# Patient Record
Sex: Male | Born: 1960 | Race: Black or African American | Hispanic: No | Marital: Single | Smoking: Current every day smoker
Health system: Southern US, Community
[De-identification: ages and names within clinical notes are randomized; demographics above are authoritative.]

## PROBLEM LIST (undated history)

## (undated) DIAGNOSIS — K219 Gastro-esophageal reflux disease without esophagitis: Secondary | ICD-10-CM

## (undated) DIAGNOSIS — E119 Type 2 diabetes mellitus without complications: Secondary | ICD-10-CM

## (undated) DIAGNOSIS — F319 Bipolar disorder, unspecified: Secondary | ICD-10-CM

## (undated) DIAGNOSIS — J449 Chronic obstructive pulmonary disease, unspecified: Secondary | ICD-10-CM

## (undated) DIAGNOSIS — I10 Essential (primary) hypertension: Secondary | ICD-10-CM

## (undated) DIAGNOSIS — E785 Hyperlipidemia, unspecified: Secondary | ICD-10-CM

---

## 2018-01-06 ENCOUNTER — Emergency Department: Payer: Medicaid Other

## 2018-01-06 ENCOUNTER — Inpatient Hospital Stay
Admission: EM | Admit: 2018-01-06 | Discharge: 2018-03-19 | DRG: 004 | Disposition: A | Discharge status: Skilled Nursing Facility | Payer: Medicaid Other | Attending: Internal Medicine | Admitting: Internal Medicine

## 2018-01-06 ENCOUNTER — Encounter: Payer: Self-pay | Admitting: Internal Medicine

## 2018-01-06 DIAGNOSIS — J690 Pneumonitis due to inhalation of food and vomit: Secondary | ICD-10-CM | POA: Diagnosis present

## 2018-01-06 DIAGNOSIS — F17210 Nicotine dependence, cigarettes, uncomplicated: Secondary | ICD-10-CM | POA: Diagnosis present

## 2018-01-06 DIAGNOSIS — K922 Gastrointestinal hemorrhage, unspecified: Secondary | ICD-10-CM | POA: Diagnosis present

## 2018-01-06 DIAGNOSIS — E870 Hyperosmolality and hypernatremia: Secondary | ICD-10-CM | POA: Diagnosis present

## 2018-01-06 DIAGNOSIS — L89153 Pressure ulcer of sacral region, stage 3: Secondary | ICD-10-CM | POA: Diagnosis not present

## 2018-01-06 DIAGNOSIS — Z7984 Long term (current) use of oral hypoglycemic drugs: Secondary | ICD-10-CM

## 2018-01-06 DIAGNOSIS — Z452 Encounter for adjustment and management of vascular access device: Secondary | ICD-10-CM

## 2018-01-06 DIAGNOSIS — J189 Pneumonia, unspecified organism: Secondary | ICD-10-CM

## 2018-01-06 DIAGNOSIS — Y95 Nosocomial condition: Secondary | ICD-10-CM | POA: Diagnosis not present

## 2018-01-06 DIAGNOSIS — F203 Undifferentiated schizophrenia: Secondary | ICD-10-CM | POA: Diagnosis not present

## 2018-01-06 DIAGNOSIS — I420 Dilated cardiomyopathy: Secondary | ICD-10-CM | POA: Diagnosis not present

## 2018-01-06 DIAGNOSIS — F332 Major depressive disorder, recurrent severe without psychotic features: Secondary | ICD-10-CM

## 2018-01-06 DIAGNOSIS — J80 Acute respiratory distress syndrome: Secondary | ICD-10-CM | POA: Diagnosis present

## 2018-01-06 DIAGNOSIS — N179 Acute kidney failure, unspecified: Secondary | ICD-10-CM | POA: Diagnosis present

## 2018-01-06 DIAGNOSIS — K5900 Constipation, unspecified: Secondary | ICD-10-CM

## 2018-01-06 DIAGNOSIS — D696 Thrombocytopenia, unspecified: Secondary | ICD-10-CM | POA: Diagnosis not present

## 2018-01-06 DIAGNOSIS — K567 Ileus, unspecified: Secondary | ICD-10-CM | POA: Diagnosis present

## 2018-01-06 DIAGNOSIS — A4159 Other Gram-negative sepsis: Principal | ICD-10-CM | POA: Diagnosis present

## 2018-01-06 DIAGNOSIS — B961 Klebsiella pneumoniae [K. pneumoniae] as the cause of diseases classified elsewhere: Secondary | ICD-10-CM | POA: Diagnosis not present

## 2018-01-06 DIAGNOSIS — T17990A Other foreign object in respiratory tract, part unspecified in causing asphyxiation, initial encounter: Secondary | ICD-10-CM | POA: Diagnosis not present

## 2018-01-06 DIAGNOSIS — I472 Ventricular tachycardia: Secondary | ICD-10-CM | POA: Diagnosis not present

## 2018-01-06 DIAGNOSIS — S83279S Complex tear of lateral meniscus, current injury, unspecified knee, sequela: Secondary | ICD-10-CM

## 2018-01-06 DIAGNOSIS — I471 Supraventricular tachycardia: Secondary | ICD-10-CM | POA: Diagnosis not present

## 2018-01-06 DIAGNOSIS — Z87820 Personal history of traumatic brain injury: Secondary | ICD-10-CM

## 2018-01-06 DIAGNOSIS — J181 Lobar pneumonia, unspecified organism: Secondary | ICD-10-CM | POA: Diagnosis not present

## 2018-01-06 DIAGNOSIS — Z9911 Dependence on respirator [ventilator] status: Secondary | ICD-10-CM | POA: Diagnosis not present

## 2018-01-06 DIAGNOSIS — Z978 Presence of other specified devices: Secondary | ICD-10-CM

## 2018-01-06 DIAGNOSIS — N5089 Other specified disorders of the male genital organs: Secondary | ICD-10-CM | POA: Diagnosis not present

## 2018-01-06 DIAGNOSIS — K8020 Calculus of gallbladder without cholecystitis without obstruction: Secondary | ICD-10-CM | POA: Diagnosis present

## 2018-01-06 DIAGNOSIS — I509 Heart failure, unspecified: Secondary | ICD-10-CM

## 2018-01-06 DIAGNOSIS — F3130 Bipolar disorder, current episode depressed, mild or moderate severity, unspecified: Secondary | ICD-10-CM | POA: Diagnosis present

## 2018-01-06 DIAGNOSIS — J9600 Acute respiratory failure, unspecified whether with hypoxia or hypercapnia: Secondary | ICD-10-CM

## 2018-01-06 DIAGNOSIS — R579 Shock, unspecified: Secondary | ICD-10-CM

## 2018-01-06 DIAGNOSIS — R6521 Severe sepsis with septic shock: Secondary | ICD-10-CM | POA: Diagnosis present

## 2018-01-06 DIAGNOSIS — Z79890 Hormone replacement therapy (postmenopausal): Secondary | ICD-10-CM

## 2018-01-06 DIAGNOSIS — R1915 Other abnormal bowel sounds: Secondary | ICD-10-CM

## 2018-01-06 DIAGNOSIS — I340 Nonrheumatic mitral (valve) insufficiency: Secondary | ICD-10-CM | POA: Diagnosis not present

## 2018-01-06 DIAGNOSIS — F419 Anxiety disorder, unspecified: Secondary | ICD-10-CM | POA: Diagnosis not present

## 2018-01-06 DIAGNOSIS — E874 Mixed disorder of acid-base balance: Secondary | ICD-10-CM | POA: Diagnosis present

## 2018-01-06 DIAGNOSIS — R0989 Other specified symptoms and signs involving the circulatory and respiratory systems: Secondary | ICD-10-CM

## 2018-01-06 DIAGNOSIS — F209 Schizophrenia, unspecified: Secondary | ICD-10-CM

## 2018-01-06 DIAGNOSIS — B370 Candidal stomatitis: Secondary | ICD-10-CM | POA: Diagnosis not present

## 2018-01-06 DIAGNOSIS — R601 Generalized edema: Secondary | ICD-10-CM | POA: Diagnosis not present

## 2018-01-06 DIAGNOSIS — K219 Gastro-esophageal reflux disease without esophagitis: Secondary | ICD-10-CM | POA: Diagnosis present

## 2018-01-06 DIAGNOSIS — I5023 Acute on chronic systolic (congestive) heart failure: Secondary | ICD-10-CM | POA: Diagnosis not present

## 2018-01-06 DIAGNOSIS — L89302 Pressure ulcer of unspecified buttock, stage 2: Secondary | ICD-10-CM | POA: Clinically undetermined

## 2018-01-06 DIAGNOSIS — J95851 Ventilator associated pneumonia: Secondary | ICD-10-CM | POA: Diagnosis not present

## 2018-01-06 DIAGNOSIS — L8990 Pressure ulcer of unspecified site, unspecified stage: Secondary | ICD-10-CM

## 2018-01-06 DIAGNOSIS — R1310 Dysphagia, unspecified: Secondary | ICD-10-CM | POA: Diagnosis not present

## 2018-01-06 DIAGNOSIS — Z0189 Encounter for other specified special examinations: Secondary | ICD-10-CM

## 2018-01-06 DIAGNOSIS — E785 Hyperlipidemia, unspecified: Secondary | ICD-10-CM | POA: Diagnosis present

## 2018-01-06 DIAGNOSIS — N170 Acute kidney failure with tubular necrosis: Secondary | ICD-10-CM | POA: Diagnosis present

## 2018-01-06 DIAGNOSIS — J9811 Atelectasis: Secondary | ICD-10-CM | POA: Diagnosis not present

## 2018-01-06 DIAGNOSIS — I480 Paroxysmal atrial fibrillation: Secondary | ICD-10-CM | POA: Diagnosis not present

## 2018-01-06 DIAGNOSIS — T85598A Other mechanical complication of other gastrointestinal prosthetic devices, implants and grafts, initial encounter: Secondary | ICD-10-CM

## 2018-01-06 DIAGNOSIS — A419 Sepsis, unspecified organism: Secondary | ICD-10-CM | POA: Diagnosis not present

## 2018-01-06 DIAGNOSIS — Z794 Long term (current) use of insulin: Secondary | ICD-10-CM

## 2018-01-06 DIAGNOSIS — J811 Chronic pulmonary edema: Secondary | ICD-10-CM

## 2018-01-06 DIAGNOSIS — Z7901 Long term (current) use of anticoagulants: Secondary | ICD-10-CM

## 2018-01-06 DIAGNOSIS — Z66 Do not resuscitate: Secondary | ICD-10-CM | POA: Diagnosis not present

## 2018-01-06 DIAGNOSIS — J9601 Acute respiratory failure with hypoxia: Secondary | ICD-10-CM | POA: Diagnosis present

## 2018-01-06 DIAGNOSIS — E876 Hypokalemia: Secondary | ICD-10-CM | POA: Diagnosis not present

## 2018-01-06 DIAGNOSIS — G9340 Encephalopathy, unspecified: Secondary | ICD-10-CM | POA: Diagnosis not present

## 2018-01-06 DIAGNOSIS — K5641 Fecal impaction: Secondary | ICD-10-CM | POA: Diagnosis not present

## 2018-01-06 DIAGNOSIS — Z993 Dependence on wheelchair: Secondary | ICD-10-CM

## 2018-01-06 DIAGNOSIS — J9621 Acute and chronic respiratory failure with hypoxia: Secondary | ICD-10-CM | POA: Diagnosis present

## 2018-01-06 DIAGNOSIS — R0789 Other chest pain: Secondary | ICD-10-CM | POA: Diagnosis not present

## 2018-01-06 DIAGNOSIS — Y848 Other medical procedures as the cause of abnormal reaction of the patient, or of later complication, without mention of misadventure at the time of the procedure: Secondary | ICD-10-CM | POA: Diagnosis not present

## 2018-01-06 DIAGNOSIS — E119 Type 2 diabetes mellitus without complications: Secondary | ICD-10-CM

## 2018-01-06 DIAGNOSIS — I110 Hypertensive heart disease with heart failure: Secondary | ICD-10-CM | POA: Diagnosis present

## 2018-01-06 DIAGNOSIS — G9341 Metabolic encephalopathy: Secondary | ICD-10-CM | POA: Diagnosis not present

## 2018-01-06 DIAGNOSIS — D709 Neutropenia, unspecified: Secondary | ICD-10-CM | POA: Diagnosis present

## 2018-01-06 DIAGNOSIS — R0603 Acute respiratory distress: Secondary | ICD-10-CM

## 2018-01-06 DIAGNOSIS — R0902 Hypoxemia: Secondary | ICD-10-CM

## 2018-01-06 DIAGNOSIS — Z4659 Encounter for fitting and adjustment of other gastrointestinal appliance and device: Secondary | ICD-10-CM

## 2018-01-06 DIAGNOSIS — D6489 Other specified anemias: Secondary | ICD-10-CM | POA: Diagnosis not present

## 2018-01-06 DIAGNOSIS — R079 Chest pain, unspecified: Secondary | ICD-10-CM | POA: Diagnosis not present

## 2018-01-06 DIAGNOSIS — J9690 Respiratory failure, unspecified, unspecified whether with hypoxia or hypercapnia: Secondary | ICD-10-CM

## 2018-01-06 DIAGNOSIS — N390 Urinary tract infection, site not specified: Secondary | ICD-10-CM | POA: Diagnosis present

## 2018-01-06 DIAGNOSIS — Z79899 Other long term (current) drug therapy: Secondary | ICD-10-CM

## 2018-01-06 DIAGNOSIS — E1165 Type 2 diabetes mellitus with hyperglycemia: Secondary | ICD-10-CM | POA: Diagnosis not present

## 2018-01-06 HISTORY — DX: Type 2 diabetes mellitus without complications: E11.9

## 2018-01-06 HISTORY — DX: Hyperlipidemia, unspecified: E78.5

## 2018-01-06 HISTORY — DX: Gastro-esophageal reflux disease without esophagitis: K21.9

## 2018-01-06 HISTORY — DX: Essential (primary) hypertension: I10

## 2018-01-06 LAB — COMPREHENSIVE METABOLIC PANEL
ALK PHOS: 49 U/L (ref 38–126)
ALT: 14 U/L — AB (ref 17–63)
AST: 32 U/L (ref 15–41)
Albumin: 3.2 g/dL — ABNORMAL LOW (ref 3.5–5.0)
Anion gap: 18 — ABNORMAL HIGH (ref 5–15)
BILIRUBIN TOTAL: 0.6 mg/dL (ref 0.3–1.2)
BUN: 57 mg/dL — ABNORMAL HIGH (ref 6–20)
CALCIUM: 8.6 mg/dL — AB (ref 8.9–10.3)
CO2: 19 mmol/L — ABNORMAL LOW (ref 22–32)
CREATININE: 3.46 mg/dL — AB (ref 0.61–1.24)
Chloride: 99 mmol/L — ABNORMAL LOW (ref 101–111)
GFR calc non Af Amer: 18 mL/min — ABNORMAL LOW (ref >60)
GFR, EST AFRICAN AMERICAN: 21 mL/min — AB (ref >60)
Glucose, Bld: 149 mg/dL — ABNORMAL HIGH (ref 65–99)
Potassium: 3.9 mmol/L (ref 3.5–5.1)
SODIUM: 136 mmol/L (ref 135–145)
TOTAL PROTEIN: 6.6 g/dL (ref 6.5–8.1)

## 2018-01-06 LAB — OCCULT BLOOD GASTRIC / DUODENUM (SPECIMEN CUP): OCCULT BLOOD, GASTRIC: NEGATIVE

## 2018-01-06 LAB — CBC WITH DIFFERENTIAL/PLATELET
Basophils Absolute: 0 10*3/uL (ref 0–0.1)
Basophils Relative: 0 %
EOS ABS: 0 10*3/uL (ref 0–0.7)
EOS PCT: 0 %
HCT: 45.5 % (ref 40.0–52.0)
Hemoglobin: 14.5 g/dL (ref 13.0–18.0)
Lymphocytes Relative: 13 %
Lymphs Abs: 1.1 10*3/uL (ref 1.0–3.6)
MCH: 27.3 pg (ref 26.0–34.0)
MCHC: 31.9 g/dL — AB (ref 32.0–36.0)
MCV: 85.5 fL (ref 80.0–100.0)
MONOS PCT: 16 %
Monocytes Absolute: 1.3 10*3/uL — ABNORMAL HIGH (ref 0.2–1.0)
Neutro Abs: 6 10*3/uL (ref 1.4–6.5)
Neutrophils Relative %: 71 %
PLATELETS: 167 10*3/uL (ref 150–440)
RBC: 5.33 MIL/uL (ref 4.40–5.90)
RDW: 18.1 % — ABNORMAL HIGH (ref 11.5–14.5)
WBC: 8.3 10*3/uL (ref 3.8–10.6)

## 2018-01-06 LAB — BLOOD GAS, ARTERIAL
ACID-BASE DEFICIT: 8 mmol/L — AB (ref 0.0–2.0)
Bicarbonate: 18.4 mmol/L — ABNORMAL LOW (ref 20.0–28.0)
FIO2: 100
Mechanical Rate: 22
O2 Saturation: 95.5 %
PATIENT TEMPERATURE: 37
pCO2 arterial: 40 mmHg (ref 32.0–48.0)
pH, Arterial: 7.27 — ABNORMAL LOW (ref 7.350–7.450)
pO2, Arterial: 89 mmHg (ref 83.0–108.0)

## 2018-01-06 LAB — URINALYSIS, ROUTINE W REFLEX MICROSCOPIC
Bacteria, UA: NONE SEEN
Bilirubin Urine: NEGATIVE
Glucose, UA: 50 mg/dL — AB
HGB URINE DIPSTICK: NEGATIVE
Ketones, ur: 5 mg/dL — AB
NITRITE: NEGATIVE
PROTEIN: 100 mg/dL — AB
Specific Gravity, Urine: 1.025 (ref 1.005–1.030)
pH: 5 (ref 5.0–8.0)

## 2018-01-06 LAB — TROPONIN I: Troponin I: 0.03 ng/mL (ref <0.03)

## 2018-01-06 LAB — LACTIC ACID, PLASMA: Lactic Acid, Venous: 6.7 mmol/L (ref 0.5–1.9)

## 2018-01-06 LAB — LIPASE, BLOOD: LIPASE: 17 U/L (ref 11–51)

## 2018-01-06 LAB — TYPE AND SCREEN
ABO/RH(D): O POS
Antibody Screen: NEGATIVE

## 2018-01-06 LAB — GLUCOSE, CAPILLARY: Glucose-Capillary: 105 mg/dL — ABNORMAL HIGH (ref 65–99)

## 2018-01-06 LAB — PROTIME-INR
INR: 1.36
PROTHROMBIN TIME: 16.7 s — AB (ref 11.4–15.2)

## 2018-01-06 LAB — PH, GASTRIC FLUID (GASTROCCULT CARD)

## 2018-01-06 LAB — PROCALCITONIN: PROCALCITONIN: 0.44 ng/mL

## 2018-01-06 MED ORDER — ASPIRIN 300 MG RE SUPP: 300.0000 mg | RECTAL | Status: DC

## 2018-01-06 MED ORDER — FENTANYL 2500MCG IN NS 250ML (10MCG/ML) PREMIX INFUSION
0.0000 ug/h | INTRAVENOUS | Status: DC
  Administered 2018-01-06: 25 ug/h via INTRAVENOUS
  Administered 2018-01-07 – 2018-01-08 (×2): 300 ug/h via INTRAVENOUS
  Administered 2018-01-08: 250 ug/h via INTRAVENOUS
  Administered 2018-01-08: 200 ug/h via INTRAVENOUS
  Administered 2018-01-09 – 2018-01-10 (×3): 250 ug/h via INTRAVENOUS
  Administered 2018-01-10: 350 ug/h via INTRAVENOUS
  Administered 2018-01-10: 250 ug/h via INTRAVENOUS
  Administered 2018-01-11: 200 ug/h via INTRAVENOUS
  Administered 2018-01-11: 350 ug/h via INTRAVENOUS
  Administered 2018-01-11: 200 ug/h via INTRAVENOUS
  Administered 2018-01-11: 250 ug/h via INTRAVENOUS
  Administered 2018-01-12: 200 ug/h via INTRAVENOUS
  Administered 2018-01-12: 300 ug/h via INTRAVENOUS
  Administered 2018-01-13: 350 ug/h via INTRAVENOUS
  Administered 2018-01-13: 300 ug/h via INTRAVENOUS
  Administered 2018-01-13: 350 ug/h via INTRAVENOUS
  Administered 2018-01-14: 400 ug/h via INTRAVENOUS
  Administered 2018-01-14 (×2): 350 ug/h via INTRAVENOUS
  Administered 2018-01-15 – 2018-01-16 (×6): 400 ug/h via INTRAVENOUS
  Administered 2018-01-16: 200 ug/h via INTRAVENOUS
  Administered 2018-01-16 – 2018-01-18 (×9): 400 ug/h via INTRAVENOUS
  Administered 2018-01-18 – 2018-01-19 (×2): 200 ug/h via INTRAVENOUS
  Administered 2018-01-19: 400 ug/h via INTRAVENOUS
  Administered 2018-01-19: 300 ug/h via INTRAVENOUS
  Administered 2018-01-19: 200 ug/h via INTRAVENOUS
  Administered 2018-01-20: 300 ug/h via INTRAVENOUS
  Filled 2018-01-06 (×43): qty 250

## 2018-01-06 MED ORDER — FENTANYL CITRATE (PF) 100 MCG/2ML IJ SOLN
INTRAMUSCULAR | Status: AC | PRN
  Administered 2018-01-06: 100 ug via INTRAVENOUS

## 2018-01-06 MED ORDER — ASPIRIN 81 MG PO CHEW: 324.0000 mg | CHEWABLE_TABLET | ORAL | Status: DC

## 2018-01-06 MED ORDER — SODIUM CHLORIDE 0.9 % IV SOLN
8.0000 mg/h | INTRAVENOUS | Status: DC
  Administered 2018-01-06 – 2018-01-07 (×3): 8 mg/h via INTRAVENOUS
  Filled 2018-01-06 (×2): qty 80

## 2018-01-06 MED ORDER — SODIUM CHLORIDE 0.9 % IV BOLUS (SEPSIS)
1000.0000 mL | Freq: Once | INTRAVENOUS | Status: AC
  Administered 2018-01-06 (×2): 1000 mL via INTRAVENOUS

## 2018-01-06 MED ORDER — VANCOMYCIN HCL IN DEXTROSE 1-5 GM/200ML-% IV SOLN
1000.0000 mg | Freq: Once | INTRAVENOUS | Status: AC
  Administered 2018-01-06: 1000 mg via INTRAVENOUS
  Filled 2018-01-06: qty 200

## 2018-01-06 MED ORDER — SODIUM CHLORIDE 0.9 % IV SOLN
1250.0000 mg | INTRAVENOUS | Status: DC
  Administered 2018-01-07: 1250 mg via INTRAVENOUS
  Filled 2018-01-06 (×2): qty 1250

## 2018-01-06 MED ORDER — PANTOPRAZOLE SODIUM 40 MG IV SOLR
40.0000 mg | Freq: Once | INTRAVENOUS | Status: AC
  Administered 2018-01-06: 40 mg via INTRAVENOUS
  Filled 2018-01-06: qty 40

## 2018-01-06 MED ORDER — SODIUM CHLORIDE 0.9 % IV SOLN
50.0000 ug/h | INTRAVENOUS | Status: DC
  Administered 2018-01-06: 50 ug/h via INTRAVENOUS
  Filled 2018-01-06 (×3): qty 1

## 2018-01-06 MED ORDER — SODIUM CHLORIDE 0.9 % IV SOLN
0.5000 mg/h | INTRAVENOUS | Status: DC
  Administered 2018-01-06: 1 mg/h via INTRAVENOUS
  Administered 2018-01-07: 4 mg/h via INTRAVENOUS
  Filled 2018-01-06 (×2): qty 10

## 2018-01-06 MED ORDER — SODIUM CHLORIDE 0.9 % IV BOLUS (SEPSIS)
1000.0000 mL | Freq: Once | INTRAVENOUS | Status: AC
  Administered 2018-01-06: 1000 mL via INTRAVENOUS

## 2018-01-06 MED ORDER — ROCURONIUM BROMIDE 50 MG/5ML IV SOLN
INTRAVENOUS | Status: AC | PRN
  Administered 2018-01-06: 100 mg via INTRAVENOUS

## 2018-01-06 MED ORDER — SODIUM CHLORIDE 0.9 % IV SOLN
Freq: Once | INTRAVENOUS | Status: AC
  Administered 2018-01-06: 19:00:00 via INTRAVENOUS

## 2018-01-06 MED ORDER — IOPAMIDOL (ISOVUE-370) INJECTION 76%: 100.0000 mL | Freq: Once | INTRAVENOUS | Status: DC | PRN

## 2018-01-06 MED ORDER — SODIUM CHLORIDE 0.9 % IV SOLN
2.0000 g | Freq: Once | INTRAVENOUS | Status: AC
  Administered 2018-01-06: 2 g via INTRAVENOUS
  Filled 2018-01-06: qty 2

## 2018-01-06 MED ORDER — FENTANYL CITRATE (PF) 2500 MCG/50ML IJ SOLN: 50.0000 ug/h | INTRAMUSCULAR | Status: DC

## 2018-01-06 MED ORDER — SODIUM CHLORIDE 0.9 % IV SOLN
0.0000 ug/min | INTRAVENOUS | Status: DC
  Administered 2018-01-06: 20 ug/min via INTRAVENOUS
  Filled 2018-01-06: qty 1
  Filled 2018-01-06: qty 10
  Filled 2018-01-06: qty 1

## 2018-01-06 MED ORDER — SODIUM CHLORIDE 0.9 % IV SOLN
10.0000 mL/h | Freq: Once | INTRAVENOUS | Status: AC
  Administered 2018-01-06: 10 mL/h via INTRAVENOUS

## 2018-01-06 MED ORDER — SODIUM CHLORIDE 0.9 % IV SOLN
250.0000 mL | INTRAVENOUS | Status: DC | PRN
  Administered 2018-01-08: 10 mL/h via INTRAVENOUS

## 2018-01-06 MED ORDER — ONDANSETRON HCL 4 MG/2ML IJ SOLN: 4.0000 mg | Freq: Four times a day (QID) | INTRAMUSCULAR | Status: DC | PRN

## 2018-01-06 MED ORDER — FENTANYL CITRATE (PF) 100 MCG/2ML IJ SOLN
INTRAMUSCULAR | Status: AC
  Filled 2018-01-06: qty 2

## 2018-01-06 MED ORDER — ETOMIDATE 2 MG/ML IV SOLN
INTRAVENOUS | Status: AC | PRN
  Administered 2018-01-06: 20 mg via INTRAVENOUS

## 2018-01-06 MED ORDER — SODIUM CHLORIDE 0.9 % IV SOLN
2.0000 g | INTRAVENOUS | Status: DC
  Filled 2018-01-06: qty 2

## 2018-01-06 MED ORDER — PHENYLEPHRINE 40 MCG/ML (10ML) SYRINGE FOR IV PUSH (FOR BLOOD PRESSURE SUPPORT)
PREFILLED_SYRINGE | INTRAVENOUS | Status: AC
  Filled 2018-01-06: qty 10

## 2018-01-06 MED ORDER — FENTANYL CITRATE (PF) 100 MCG/2ML IJ SOLN
100.0000 ug | Freq: Once | INTRAMUSCULAR | Status: AC
  Administered 2018-01-06: 100 ug via INTRAVENOUS

## 2018-01-06 MED ORDER — SODIUM CHLORIDE 0.9 % IV SOLN
1000.0000 mL | INTRAVENOUS | Status: DC
  Administered 2018-01-06 – 2018-01-07 (×2): 1000 mL via INTRAVENOUS

## 2018-01-06 NOTE — ED Notes (Signed)
RT at bedside.

## 2018-01-06 NOTE — H&P (Signed)
[facility] - [place] at [facility]   PATIENT NAME: Tommy Mathews    MR#:  [id number]  DATE OF BIRTH:  [birth year]  DATE OF ADMISSION:  01/06/2018  PRIMARY CARE PHYSICIAN: Patient, [name]   REQUESTING/REFERRING PHYSICIAN: Roxan Hockey, MD  CHIEF COMPLAINT:   Chief Complaint  Patient presents with  . Respiratory Distress    HISTORY OF PRESENT ILLNESS:  Tommy Mathews  is a 57 y.o. male who presents with respiratory failure with hypoxia.  Patient required intubation here in the ED.  He also had a large amount of dark coffee-ground type material suctioned from his OG tube.  Strong concern for possible GI bleed.  Patient is on Xarelto.  He presented from a facility with little known medical history, he does have a med list which shows Xarelto on a number of other medications.  He is also found to have multifocal pneumonia, hypertension, likely UTI.  Hospitalist called for admission  PAST MEDICAL HISTORY:   Past Medical History:  Diagnosis Date  . Diabetes (HCC)   . GERD (gastroesophageal reflux disease)   . HLD (hyperlipidemia)   . HTN (hypertension)      PAST SURGICAL HISTORY:  Unable to obtain surgical history due to critical illness  SOCIAL HISTORY:   Social History   Tobacco Use  . Smoking status: Unknown If Ever Smoked  Substance Use Topics  . Alcohol use: Not on file    Unable to obtain Social history due to critical illness  FAMILY HISTORY:  Unable to obtain Family History due to critical illness  DRUG ALLERGIES:  Unable to review drug allergies due to critical illness  MEDICATIONS AT HOME:   Prior to Admission medications   Medication Sig Start Date End Date Taking? Authorizing Provider  atorvastatin (LIPITOR) 80 MG tablet Take 80 mg by mouth at bedtime.   Yes [provider]  divalproex (DEPAKOTE ER) 500 MG 24 hr tablet Take 500 mg by mouth 2 (two) times daily.   Yes [provider]  estradiol (ESTRACE) 2 MG  tablet Take 2 mg by mouth daily.   Yes [provider]  FLUoxetine (PROZAC) 40 MG capsule Take 40 mg by mouth daily.   Yes [provider]  folic acid (FOLVITE) 800 MCG tablet Take 800 mcg by mouth daily.   Yes [provider]  hydrochlorothiazide (HYDRODIURIL) 50 MG tablet Take 50 mg by mouth daily.   Yes [provider]  lisinopril (PRINIVIL,ZESTRIL) 5 MG tablet Take 5 mg by mouth daily.   Yes [provider]  metFORMIN (GLUCOPHAGE) 1000 MG tablet Take 1,000 mg by mouth 2 (two) times daily with a meal.   Yes [provider]  methotrexate (RHEUMATREX) 2.5 MG tablet Take 15 mg by mouth once a week.   Yes [provider]  metoprolol tartrate (LOPRESSOR) 50 MG tablet Take 50 mg by mouth 2 (two) times daily.   Yes [provider]  Omega-3 Fatty Acids (FISH OIL) 1000 MG CAPS Take 1,000 mg by mouth daily.   Yes [provider]  QUEtiapine Fumarate (SEROQUEL PO) Take 500 mg by mouth 2 (two) times daily.   Yes [provider]  ranitidine (ZANTAC) 150 MG tablet Take 150 mg by mouth daily.   Yes [provider]  rivaroxaban (XARELTO) 10 MG TABS tablet Take 10 mg by mouth daily.   Yes [provider]  senna-docusate (SENOKOT-S) 8.6-50 MG tablet Take 2 tablets by mouth at bedtime.   Yes  [provider]  sodium chloride 1 g tablet Take 1 g by mouth daily.   Yes [provider]    REVIEW OF SYSTEMS:  Review of Systems  Unable to perform ROS: Critical illness     VITAL SIGNS:   Vitals:   01/06/18 2100 01/06/18 2108 01/06/18 2109 01/06/18 2115  BP: (!) 139/102  (!) 130/100 (!) 132/101  Pulse: (!) 113 (!) 116 (!) 117 (!) 117  Resp:      Temp:      SpO2: 100% 99% 100% 98%   Wt Readings from Last 3 Encounters:  No data found for Wt    PHYSICAL EXAMINATION:  Physical Exam  Vitals reviewed. Constitutional: He appears well-developed and well-nourished. No distress.  HENT:   Head: Normocephalic and atraumatic.  Mouth/Throat: Oropharynx is clear and moist.  Eyes: Pupils are equal, round, and reactive to light. Conjunctivae and EOM are normal. No scleral icterus.  Neck: Normal range of motion. Neck supple. No JVD present. No thyromegaly present.  Cardiovascular: Regular rhythm and intact distal pulses. Exam reveals no gallop and no friction rub.  No murmur heard. Tachycardic  Respiratory: He is in respiratory distress. He has no wheezes. He has no rales.  Intubated, bilateral coarse breath sounds diffusely  GI: Soft. Bowel sounds are normal. He exhibits no distension. There is no tenderness.  Musculoskeletal: Normal range of motion. He exhibits no edema.  No arthritis, no gout  Lymphadenopathy:    He has no cervical adenopathy.  Neurological:  Intubated and sedated  Skin: Skin is warm and dry. No rash noted. No erythema.  Psychiatric:  Could not assess since patient is intubated and sedated    LABORATORY PANEL:   CBC Recent Labs  Lab 01/06/18 1931  WBC 8.3  HGB 14.5  HCT 45.5  PLT 167   ------------------------------------------------------------------------------------------------------------------  Chemistries  Recent Labs  Lab 01/06/18 1931  NA 136  K 3.9  CL 99*  CO2 19*  GLUCOSE 149*  BUN 57*  CREATININE 3.46*  CALCIUM 8.6*  AST 32  ALT 14*  ALKPHOS 49  BILITOT 0.6   ------------------------------------------------------------------------------------------------------------------  Cardiac Enzymes Recent Labs  Lab 01/06/18 1931  TROPONINI 0.03*   ------------------------------------------------------------------------------------------------------------------  RADIOLOGY:  Ct Abdomen Pelvis Wo Contrast  Result Date: 01/06/2018 CLINICAL DATA:  Respiratory arrest.  Suspect pulmonary embolism. EXAM: CT CHEST, ABDOMEN AND PELVIS WITHOUT CONTRAST TECHNIQUE: Multidetector CT imaging of the chest, abdomen and pelvis was  performed following the standard protocol without IV contrast. COMPARISON:  Chest radiograph January 06, 2018 FINDINGS: CT CHEST FINDINGS CARDIOVASCULAR: Heart and pericardium are unremarkable. Thoracic aorta is normal course and caliber, mild calcific atherosclerosis. MEDIASTINUM/NODES: No mediastinal mass. No lymphadenopathy by CT size criteria limited assessment without contrast. Normal appearance of thoracic esophagus though not tailored for evaluation. LUNGS/PLEURA: Tracheobronchial tree is patent, no pneumothorax. Endotracheal tube tip above the carina. Bilateral tree-in-bud infiltrates and dense consolidation with air bronchograms. RIGHT upper lobe collapse with soft tissue effacing RIGHT upper lobe bronchus. MUSCULOSKELETAL: Included soft tissues and included osseous structures appear normal. CT ABDOMEN PELVIS FINDINGS HEPATOBILIARY: Subcentimeter layering gallstones without CT findings of acute cholecystitis by noncontrast CT. Trace perihepatic focal fat. PANCREAS: Normal. SPLEEN: Normal. ADRENALS/URINARY TRACT: Kidneys are orthotopic, demonstrating normal size and morphology. No nephrolithiasis, hydronephrosis; limited assessment for renal masses on this nonenhanced examination. The unopacified ureters are normal in course and caliber. Urinary bladder decompressed by Foley catheter. Normal adrenal glands. STOMACH/BOWEL: Nasogastric tube terminates in distal stomach. Gas distended small bowel measuring  to 3.8 cm with multiple transition points in air-fluid levels. Large bowel normal in course and caliber. Normal appendix. VASCULAR/LYMPHATIC: Mildly ectatic 2.4 cm infrarenal aorta. Mild calcific atherosclerosis. No lymphadenopathy by CT size criteria. REPRODUCTIVE: Normal. OTHER: No intraperitoneal free fluid or free air. MUSCULOSKELETAL: Non-acute. Small fat containing LEFT inguinal hernia. Mild L5-S1 degenerative disc. IMPRESSION: CT CHEST: 1. Pulmonary embolism cannot be excluded by noncontrast CT. 2.  Multifocal severe pneumonia with RIGHT upper lobe collapse and soft tissue obstructing RIGHT upper lobe bronchus. Consider bronchoscopy. 3. Endotracheal tube tip above the carina. CT ABDOMEN AND PELVIS: 1. Mild small bowel ileus, potential enteritis. Nasogastric tube terminates in distal stomach. 2. Cholelithiasis without CT findings of acute cholecystitis. Aortic Atherosclerosis (ICD10-I70.0). Electronically Signed   By: Awilda Metro M.D.   On: 01/06/2018 21:44   Ct Chest Wo Contrast  Result Date: 01/06/2018 CLINICAL DATA:  Respiratory arrest.  Suspect pulmonary embolism. EXAM: CT CHEST, ABDOMEN AND PELVIS WITHOUT CONTRAST TECHNIQUE: Multidetector CT imaging of the chest, abdomen and pelvis was performed following the standard protocol without IV contrast. COMPARISON:  Chest radiograph January 06, 2018 FINDINGS: CT CHEST FINDINGS CARDIOVASCULAR: Heart and pericardium are unremarkable. Thoracic aorta is normal course and caliber, mild calcific atherosclerosis. MEDIASTINUM/NODES: No mediastinal mass. No lymphadenopathy by CT size criteria limited assessment without contrast. Normal appearance of thoracic esophagus though not tailored for evaluation. LUNGS/PLEURA: Tracheobronchial tree is patent, no pneumothorax. Endotracheal tube tip above the carina. Bilateral tree-in-bud infiltrates and dense consolidation with air bronchograms. RIGHT upper lobe collapse with soft tissue effacing RIGHT upper lobe bronchus. MUSCULOSKELETAL: Included soft tissues and included osseous structures appear normal. CT ABDOMEN PELVIS FINDINGS HEPATOBILIARY: Subcentimeter layering gallstones without CT findings of acute cholecystitis by noncontrast CT. Trace perihepatic focal fat. PANCREAS: Normal. SPLEEN: Normal. ADRENALS/URINARY TRACT: Kidneys are orthotopic, demonstrating normal size and morphology. No nephrolithiasis, hydronephrosis; limited assessment for renal masses on this nonenhanced examination. The unopacified ureters are  normal in course and caliber. Urinary bladder decompressed by Foley catheter. Normal adrenal glands. STOMACH/BOWEL: Nasogastric tube terminates in distal stomach. Gas distended small bowel measuring to 3.8 cm with multiple transition points in air-fluid levels. Large bowel normal in course and caliber. Normal appendix. VASCULAR/LYMPHATIC: Mildly ectatic 2.4 cm infrarenal aorta. Mild calcific atherosclerosis. No lymphadenopathy by CT size criteria. REPRODUCTIVE: Normal. OTHER: No intraperitoneal free fluid or free air. MUSCULOSKELETAL: Non-acute. Small fat containing LEFT inguinal hernia. Mild L5-S1 degenerative disc. IMPRESSION: CT CHEST: 1. Pulmonary embolism cannot be excluded by noncontrast CT. 2. Multifocal severe pneumonia with RIGHT upper lobe collapse and soft tissue obstructing RIGHT upper lobe bronchus. Consider bronchoscopy. 3. Endotracheal tube tip above the carina. CT ABDOMEN AND PELVIS: 1. Mild small bowel ileus, potential enteritis. Nasogastric tube terminates in distal stomach. 2. Cholelithiasis without CT findings of acute cholecystitis. Aortic Atherosclerosis (ICD10-I70.0). Electronically Signed   By: Awilda Metro M.D.   On: 01/06/2018 21:44   Dg Chest Portable 1 View  Result Date: 01/06/2018 CLINICAL DATA:  ETT placement EXAM: PORTABLE CHEST 1 VIEW COMPARISON:  None. FINDINGS: Endotracheal tube tip is about 2.4 cm superior to the carina. Low lung volumes. No pleural effusion or pneumothorax. Heart size within normal limits. Streaky left greater than right hilar opacity. IMPRESSION: 1. Endotracheal tube tip about 2.4 cm superior to the carina 2. Low lung volumes. Streaky left greater than right perihilar opacity of uncertain chronicity. Electronically Signed   By: Jasmine Pang M.D.   On: 01/06/2018 19:35    EKG:   Orders placed or performed  during the hospital encounter of 01/06/18  . ED EKG 12-Lead  . ED EKG 12-Lead  . EKG 12-Lead  . EKG 12-Lead    IMPRESSION AND PLAN:   Principal Problem:   Severe sepsis with septic shock (HCC) -broad-spectrum antibiotics, lactic acid very elevated, aggressive IV fluid resuscitation in place, trend lactate until within normal limits, this is likely due to his pneumonia and UTI, cultures sent from the ED, admit to ICU Active Problems:   GI bleed -unclear etiology, hold Xarelto, PPI drip in place, GI consult, PRBC transfusion given in the ED   Acute respiratory failure with hypoxia (HCC) -intubated and mechanically ventilated, likely due to his pneumonia and a possible aspiration event   Multifocal pneumonia -IV antibiotics as above, intubated as above, supportive treatment PRN   AKI (acute kidney injury) (HCC) -aggressive IV fluids as above, monitor for improvement   UTI (urinary tract infection) -broad-spectrum antibiotics as above   Ileus (HCC) -patient has OG tube in place to suction   Diabetes (HCC) -sliding scale insulin with corresponding glucose checks  Chart review performed and case discussed with ED provider. Labs, imaging and/or ECG reviewed by provider and discussed with patient/family. Management plans discussed with the patient and/or family.  DVT PROPHYLAXIS: Mechanical only  GI PROPHYLAXIS: PPI  ADMISSION STATUS: Inpatient  CODE STATUS: Full  TOTAL CRITICAL CARE TIME TAKING CARE OF THIS PATIENT: 50 minutes.   Selena Swaminathan FIELDING 01/06/2018, 10:05 PM  [facility]  Office  [phone]  CC: Primary care physician; Patient, [name]  Note:  This document was prepared using Dragon voice recognition software and may include unintentional dictation errors.

## 2018-01-06 NOTE — Progress Notes (Signed)
eLink Physician-Brief Progress Note Patient Name: Tommy Mathews DOB: 03-23-1961 MRN: [id number]   Date of Service  01/06/2018  HPI/Events of Note   57 y.o. male who presents with respiratory failure with hypoxia.  Patient required intubation here in the ED.  He also had a large amount of dark coffee-ground type material suctioned from his OG tube.  Strong concern for possible GI bleed.  Patient is on Xarelto.  He presented from a facility with little known medical history, he does have a med list which shows Xarelto on a number of other medications.  He is also found to have multifocal pneumonia, hypertension, likely UTI. Patient already seen by PCCM. VSS.   eICU Interventions  No new orders.      Intervention Category Evaluation Type: New Patient Evaluation  Tommy Mathews,Tommy Mathews 01/06/2018, 11:19 PM

## 2018-01-06 NOTE — Progress Notes (Signed)
Pharmacy Antibiotic Note  Tommy Mathews is a 57 y.o. male admitted on 01/06/2018 with sepsis.  Pharmacy has been consulted for vanc/cefepime dosing.  Plan: Patient received vanc 1g and cefepime 2g IV x 1  Will continue vanc 1.25g IV q24h w/ 6 hour stack Will draw vanc trough 04/12 @ 0500 prior to 4th dose. Will continue cefepime 2g IV q24h  Ke 0.0289 T1/2 24 hrs Goal trough 15 - 20 mcg/mL  Height: 5\' 10"  (177.8 cm) Weight: 236 lb 12.4 oz (107.4 kg) IBW/kg (Calculated) : 73  Temp (24hrs), Avg:98.2 F (36.8 C), Min:94.8 F (34.9 C), Max:99.4 F (37.4 C)  Recent Labs  Lab 01/06/18 1931  WBC 8.3  CREATININE 3.46*  LATICACIDVEN 6.7*    Estimated Creatinine Clearance: 29.6 mL/min (A) (by C-G formula based on SCr of 3.46 mg/dL (H)).    Not on File  Thank you for allowing pharmacy to be a part of this patient's care.  Thomasene Rippleavid Torri Michalski, PharmD, BCPS Clinical Pharmacist 01/06/2018

## 2018-01-06 NOTE — Consult Note (Signed)
   CHIEF COMPLAINT:   Chief Complaint  Patient presents with  . Respiratory Distress    Subjective  Tommy Mathews  is a 57 y.o. male who presents with respiratory failure with hypoxia.  Patient required intubation here in the ED.  He also had a large amount of dark coffee-ground type material suctioned from his OG tube.  Strong concern for possible GI bleed.  Patient is on Xarelto.  He presented from a facility with little known medical history, he does have a med list which shows Xarelto on a number of other medications.  He is also found to have multifocal pneumonia, hypertension, likely UTI.    Patient intubated,sedated CT chest reviewed b/l opacities c/w pneumonia      Objective   PHYSICAL EXAMINATION:  GENERAL:critically ill appearing, +resp distress HEAD: Normocephalic, atraumatic.  EYES: Pupils equal, round, reactive to light.  No scleral icterus.  MOUTH: Moist mucosal membrane. NECK: Supple. No thyromegaly. No nodules. No JVD.  PULMONARY: +rhonchi, +wheezing CARDIOVASCULAR: S1 and S2. Regular rate and rhythm. No murmurs, rubs, or gallops.  GASTROINTESTINAL: Soft, nontender, -distended. No masses. Positive bowel sounds. No hepatosplenomegaly.  MUSCULOSKELETAL: No swelling, clubbing, or edema.  NEUROLOGIC: obtunded SKIN:intact,warm,dry    VITALS:  temperature is 98.2 F (36.8 C). His blood pressure is 132/101 (abnormal) and his pulse is 117 (abnormal). His respiration is 22 (abnormal) and oxygen saturation is 98%.   I personally reviewed Labs under Results section.  Radiology-CXR and CT scans reviewed     Assessment/Plan:  57 yo AAM with acute and severe resp failure with severe sepsis with b/l pneumonia recurring vent support  1.Respiratory Failure -continue Full MV support -continue Bronchodilator Therapy -Wean Fio2 and PEEP as tolerated -will perform SAT/SBt when respiratory parameters are met  2.Renal Failure-most likely due to ATN -follow chem  7 -follow UO -continue Foley Catheter-assess need   3.NEURO - intubated and sedated - minimal sedation to achieve a RASS goal: -1   4.Septic shock -use vasopressors to keep MAP>65 -follow ABG and LA -follow up cultures -emperic ABX -consider stress dose steroids   Overall, patient is critically ill, prognosis is guarded.  Patient with Multiorgan failure and at high risk for cardiac arrest and death.    Corrin Parker, M.D.  [facility]  Medical Director [place] Director [facility] Cardio-Pulmonary Department

## 2018-01-06 NOTE — ED Triage Notes (Signed)
Pt arrives via EMS. EMS unsure of name of facility or pts exact name. Pt unable to answer any questions. Resp status deteriorating rapidly. Resp at bedside. EDP at bedside

## 2018-01-06 NOTE — ED Provider Notes (Signed)
[facility] Emergency Department Provider Note    First MD Initiated Contact with Patient 01/06/18 1943     (approximate)  I have reviewed the triage vital signs and the nursing notes.   HISTORY  Chief Complaint Respiratory Distress  Level V Caveat:  Respiratory distress  HPI Blaire Hodsdon is a 57 y.o. male arrives via EMS due to shortness of breath and initial report of some epigastric discomfort however this is slightly unclear.  Patient was placed on CPAP.  By EMS was found to be hypoxic to low 80s with significant respiratory distress tachypneic to the 50s.  Patient unable to provide any additional history.  EMS states the patient was just admitted to the hospital here however there is no documentation of this.  Patient arrives significantly tachypneic satting well on CPAP but becoming increasingly more somnolent therefore patient was intubated for airway protection and respiratory support due to his critical condition.   Past Medical History:  Diagnosis Date  . Diabetes (HCC)   . GERD (gastroesophageal reflux disease)   . HLD (hyperlipidemia)   . HTN (hypertension)    No family history on file.  Patient Active Problem List   Diagnosis Date Noted  . GI bleed 01/06/2018  . Acute respiratory failure with hypoxia (HCC) 01/06/2018  . Severe sepsis with septic shock (HCC) 01/06/2018  . Multifocal pneumonia 01/06/2018  . UTI (urinary tract infection) 01/06/2018  . AKI (acute kidney injury) (HCC) 01/06/2018  . Ileus (HCC) 01/06/2018  . Diabetes (HCC) 01/06/2018      Prior to Admission medications   Medication Sig Start Date End Date Taking? Authorizing Provider  atorvastatin (LIPITOR) 80 MG tablet Take 80 mg by mouth at bedtime.   Yes [provider]  divalproex (DEPAKOTE ER) 500 MG 24 hr tablet Take 500 mg by mouth 2 (two) times daily.   Yes [provider]  estradiol (ESTRACE) 2 MG tablet Take 2 mg by mouth daily.   Yes  [provider]  FLUoxetine (PROZAC) 40 MG capsule Take 40 mg by mouth daily.   Yes [provider]  folic acid (FOLVITE) 800 MCG tablet Take 800 mcg by mouth daily.   Yes [provider]  hydrochlorothiazide (HYDRODIURIL) 50 MG tablet Take 50 mg by mouth daily.   Yes [provider]  lisinopril (PRINIVIL,ZESTRIL) 5 MG tablet Take 5 mg by mouth daily.   Yes [provider]  metFORMIN (GLUCOPHAGE) 1000 MG tablet Take 1,000 mg by mouth 2 (two) times daily with a meal.   Yes [provider]  methotrexate (RHEUMATREX) 2.5 MG tablet Take 15 mg by mouth once a week.   Yes [provider]  metoprolol tartrate (LOPRESSOR) 50 MG tablet Take 50 mg by mouth 2 (two) times daily.   Yes [provider]  Omega-3 Fatty Acids (FISH OIL) 1000 MG CAPS Take 1,000 mg by mouth daily.   Yes [provider]  QUEtiapine Fumarate (SEROQUEL PO) Take 500 mg by mouth 2 (two) times daily.   Yes [provider]  ranitidine (ZANTAC) 150 MG tablet Take 150 mg by mouth daily.   Yes [provider]  rivaroxaban (XARELTO) 10 MG TABS tablet Take 10 mg by mouth daily.   Yes [provider]  senna-docusate (SENOKOT-S) 8.6-50 MG tablet Take 2 tablets by mouth at bedtime.   Yes [provider]  sodium chloride 1 g tablet Take 1 g by mouth daily.   Yes [provider]  Allergies Patient has no allergy information on record.    Social History Social History   Tobacco Use  . Smoking status: Unknown If Ever Smoked  Substance Use Topics  . Alcohol use: Not on file  . Drug use: Not on file    Review of Systems Patient denies headaches, rhinorrhea, blurry vision, numbness, shortness of breath, chest pain, edema, cough, abdominal pain, nausea, vomiting, diarrhea, dysuria, fevers, rashes or hallucinations unless otherwise stated above in HPI. ____________________________________________   PHYSICAL  EXAM:  VITAL SIGNS: Vitals:   01/06/18 2109 01/06/18 2115  BP: (!) 130/100 (!) 132/101  Pulse: (!) 117 (!) 117  Resp:    Temp:    SpO2: 100% 98%    Constitutional: ill-appearing tachypneic even on CPAP and critical condition with acute respiratory distress Eyes: Conjunctivae are normal.  Head: Atraumatic. Nose: No congestion/rhinnorhea. Mouth/Throat: Mucous membranes are moist.   Neck: No stridor. Painless ROM.  Cardiovascular: Tachycardic grossly normal heart sounds.  Good peripheral circulation. Respiratory: Tachypnea with bilateral rhonchi use of accessory muscles on CPAP Gastrointestinal: Soft and nontender. Genitourinary: normal external genitalia Musculoskeletal: No lower extremity tenderness nor edema.  No joint effusions. Neurologic: Moving all extremities but becoming increasingly somnolent Skin:  Skin is warm, dry and intact. No rash noted. Psychiatric: Unable to assess ____________________________________________   LABS (all labs ordered are listed, but only abnormal results are displayed)  Results for orders placed or performed during the hospital encounter of 01/06/18 (from the past 24 hour(s))  Comprehensive metabolic panel     Status: Abnormal   Collection Time: 01/06/18  7:31 PM  Result Value Ref Range   Sodium 136 135 - 145 mmol/L   Potassium 3.9 3.5 - 5.1 mmol/L   Chloride 99 (L) 101 - 111 mmol/L   CO2 19 (L) 22 - 32 mmol/L   Glucose, Bld 149 (H) 65 - 99 mg/dL   BUN 57 (H) 6 - 20 mg/dL   Creatinine, Ser 4.54 (H) 0.61 - 1.24 mg/dL   Calcium 8.6 (L) 8.9 - 10.3 mg/dL   Total Protein 6.6 6.5 - 8.1 g/dL   Albumin 3.2 (L) 3.5 - 5.0 g/dL   AST 32 15 - 41 U/L   ALT 14 (L) 17 - 63 U/L   Alkaline Phosphatase 49 38 - 126 U/L   Total Bilirubin 0.6 0.3 - 1.2 mg/dL   GFR calc non Af Amer 18 (L) >60 mL/min   GFR calc Af Amer 21 (L) >60 mL/min   Anion gap 18 (H) 5 - 15  Troponin I     Status: Abnormal   Collection Time: 01/06/18  7:31 PM  Result Value Ref Range    Troponin I 0.03 (HH) <0.03 ng/mL  CBC with Differential     Status: Abnormal   Collection Time: 01/06/18  7:31 PM  Result Value Ref Range   WBC 8.3 3.8 - 10.6 K/uL   RBC 5.33 4.40 - 5.90 MIL/uL   Hemoglobin 14.5 13.0 - 18.0 g/dL   HCT 09.8 11.9 - 14.7 %   MCV 85.5 80.0 - 100.0 fL   MCH 27.3 26.0 - 34.0 pg   MCHC 31.9 (L) 32.0 - 36.0 g/dL   RDW 82.9 (H) 56.2 - 13.0 %   Platelets 167 150 - 440 K/uL   Neutrophils Relative % 71 %   Neutro Abs 6.0 1.4 - 6.5 K/uL   Lymphocytes Relative 13 %   Lymphs Abs 1.1 1.0 - 3.6 K/uL   Monocytes Relative 16 %  Monocytes Absolute 1.3 (H) 0.2 - 1.0 K/uL   Eosinophils Relative 0 %   Eosinophils Absolute 0.0 0 - 0.7 K/uL   Basophils Relative 0 %   Basophils Absolute 0.0 0 - 0.1 K/uL  Blood gas, arterial     Status: Abnormal   Collection Time: 01/06/18  7:31 PM  Result Value Ref Range   FIO2 100.00    pH, Arterial 7.27 (L) 7.350 - 7.450   pCO2 arterial 40 32.0 - 48.0 mmHg   pO2, Arterial 89 83.0 - 108.0 mmHg   Bicarbonate 18.4 (L) 20.0 - 28.0 mmol/L   Acid-base deficit 8.0 (H) 0.0 - 2.0 mmol/L   O2 Saturation 95.5 %   Patient temperature 37.0    Collection site LEFT BRACHIAL    Sample type ARTERIAL DRAW    Allens test (pass/fail) PASS PASS   Mechanical Rate 22   Lactic acid, plasma     Status: Abnormal   Collection Time: 01/06/18  7:31 PM  Result Value Ref Range   Lactic Acid, Venous 6.7 (HH) 0.5 - 1.9 mmol/L  Lipase, blood     Status: None   Collection Time: 01/06/18  7:31 PM  Result Value Ref Range   Lipase 17 11 - 51 U/L  Procalcitonin     Status: None   Collection Time: 01/06/18  7:31 PM  Result Value Ref Range   Procalcitonin 0.44 ng/mL  Protime-INR     Status: Abnormal   Collection Time: 01/06/18  7:31 PM  Result Value Ref Range   Prothrombin Time 16.7 (H) 11.4 - 15.2 seconds   INR 1.36   Urinalysis, Routine w reflex microscopic     Status: Abnormal   Collection Time: 01/06/18  7:44 PM  Result Value Ref Range   Color,  Urine AMBER (A) YELLOW   APPearance TURBID (A) CLEAR   Specific Gravity, Urine 1.025 1.005 - 1.030   pH 5.0 5.0 - 8.0   Glucose, UA 50 (A) NEGATIVE mg/dL   Hgb urine dipstick NEGATIVE NEGATIVE   Bilirubin Urine NEGATIVE NEGATIVE   Ketones, ur 5 (A) NEGATIVE mg/dL   Protein, ur 191100 (A) NEGATIVE mg/dL   Nitrite NEGATIVE NEGATIVE   Leukocytes, UA MODERATE (A) NEGATIVE   RBC / HPF 6-30 0 - 5 RBC/hpf   WBC, UA TOO NUMEROUS TO COUNT 0 - 5 WBC/hpf   Bacteria, UA NONE SEEN NONE SEEN   Squamous Epithelial / LPF 6-30 (A) NONE SEEN   WBC Clumps PRESENT    Mucus PRESENT    Hyaline Casts, UA PRESENT    Non Squamous Epithelial 0-5 (A) NONE SEEN  pH, gastric fluid (gastroccult card)     Status: None   Collection Time: 01/06/18  8:26 PM  Result Value Ref Range   pH, Gastric 5 TO 7   Occult bld gastric/duodenum (cup to lab)     Status: None   Collection Time: 01/06/18  8:26 PM  Result Value Ref Range   pH, Gastric 5 TO 7    Occult Blood, Gastric NEGATIVE NEGATIVE  Type and screen     Status: None   Collection Time: 01/06/18  8:59 PM  Result Value Ref Range   ABO/RH(D) O POS    Antibody Screen NEG    Sample Expiration      01/09/2018 Performed at [facility], [address] [place], [place] [address]    ____________________________________________  EKG My review and personal interpretation at Time: 19:08   Indication: resp distress  Rate:  135  Rhythm: sinus tach Axis: normal Other: subtle inferolateral st depressions and nonspecific t wave changes likely rate dependent ____________________________________________  RADIOLOGY  I personally reviewed all radiographic images ordered to evaluate for the above acute complaints and reviewed radiology reports and findings.  These findings were personally discussed with the patient.  Please see medical record for radiology report.  ____________________________________________   PROCEDURES  Procedure(s) performed:  .Critical  Care Performed by: Willy Eddy, MD Authorized by: Willy Eddy, MD   Critical care provider statement:    Critical care time (minutes):  48   Critical care time was exclusive of:  Separately billable procedures and treating other patients   Critical care was necessary to treat or prevent imminent or life-threatening deterioration of the following conditions:  Respiratory failure   Critical care was time spent personally by me on the following activities:  Development of treatment plan with patient or surrogate, discussions with consultants, evaluation of patient's response to treatment, examination of patient, obtaining history from patient or surrogate, ordering and performing treatments and interventions, ordering and review of laboratory studies, ordering and review of radiographic studies, pulse oximetry, re-evaluation of patient's condition and review of old charts Procedure Name: Intubation Date/Time: 01/06/2018 8:59 PM Performed by: Willy Eddy, MD Pre-anesthesia Checklist: Patient identified, Emergency Drugs available, Suction available and Patient being monitored Preoxygenation: Pre-oxygenation with 100% oxygen Induction Type: IV induction and Rapid sequence Laryngoscope Size: Glidescope and 3 Grade View: Grade II Tube size: 7.5 mm Number of attempts: 1 Airway Equipment and Method: Stylet and Video-laryngoscopy Placement Confirmation: ETT inserted through vocal cords under direct vision,  CO2 detector and Breath sounds checked- equal and bilateral Secured at: 22 cm Tube secured with: ETT holder Dental Injury: Teeth and Oropharynx as per pre-operative assessment     .Central Line Date/Time: 01/06/2018 9:01 PM Performed by: Willy Eddy, MD Authorized by: Willy Eddy, MD   Consent:    Consent obtained:  Emergent situation Pre-procedure details:    Hand hygiene: Hand hygiene performed prior to insertion     Sterile barrier technique: All elements of  maximal sterile technique followed     Skin preparation:  2% chlorhexidine Procedure details:    Location:  L femoral   Patient position:  Flat   Procedural supplies:  Triple lumen   Catheter size:  7.5 Fr   Landmarks identified: yes     Ultrasound guidance: yes     Sterile ultrasound techniques: Sterile gel and sterile probe covers were used     Number of attempts:  1   Successful placement: yes   Post-procedure details:    Post-procedure:  Dressing applied and line sutured   Assessment:  Blood return through all ports   Patient tolerance of procedure:  Tolerated well, no immediate complications      Critical Care performed: yes  ____________________________________________   INITIAL IMPRESSION / ASSESSMENT AND PLAN / ED COURSE  Pertinent labs & imaging results that were available during my care of the patient were reviewed by me and considered in my medical decision making (see chart for details).  DDX: COPD, CHF, pneumonia, GI bleed, sepsis, heart failure  Forrester Blando is a 57 y.o. who presents to the ED with acute respiratory distress and critically ill as described above.  Patient was initially tried on BiPAP but had decreasing mentation and based on his tachypnea with respiratory distress I did elect to emergently intubate the patient.  There was some difficulty obtaining IV access but the patient was  maintaining saturations on BiPAP.  After being able to obtain IO access patient was intubated successfully.  Was found to be hypotensive and received IV fluids.  After insertion of the NG tube after intubation it was noted the patient had very dark coffee-ground appearing emesis.  Started on Protonix bolus and drip.  Will order broad-spectrum antibiotics due to concern for pneumonia given respiratory distress.  Clinical Course as of Jan 07 2204  Mon Jan 06, 2018  2040 Patient blood pressure stabilizing after near completion of his volume resuscitation.  Heart rate is also  improving.  Still awaiting results from lab   [PR]  2047 gastric output does also seem to have improved.   [PR]  2134 Lactic acid is 6.  Trop elevated.  Ct review of chest, my read, shows probable aspiration.  Significant aki noted.  Will continue IV protonix.   [PR]  2144 We will continue with transfusion for severe sepsis and shock.  Has received broad-spectrum antibiotics.  I spoke with the patient's mother and relayed my concern regarding his poor prognosis and critical condition.  Will start octreotide due to possible variceal bleed as I do not have any additional past medical history at this point as patient's INR is mildly elevated may have underlying cirrhosis.  Symptoms may simply be secondary to significant multilobar pneumonia but difficulty to determine whether this is secondary to aspiration in the setting possible upper GI bleed.  We will continue with aggressive IV fluid resuscitation.   Patient will be admitted to the ICU.   [PR]  2200 Sepsis - Repeat Assessment  Performed at:    10:01  Vitals     @VITALSNOTREFRESHABLE @  Heart:     Tachycardic  Lungs:    Rhonchi  Capillary Refill:   <2 sec  Peripheral Pulse:   Radial pulse palpable and Posterior tibialis pulse  palpable  Skin:     Dry       [PR]    Clinical Course User Index [PR] Willy Eddy, MD     As part of my medical decision making, I reviewed the following data within the electronic MEDICAL RECORD NUMBER Nursing notes reviewed and incorporated, Labs reviewed, notes from prior ED visits  ____________________________________________   FINAL CLINICAL IMPRESSION(S) / ED DIAGNOSES  Final diagnoses:  Acute respiratory failure with hypoxemia (HCC)  Shock (HCC)  Sepsis, due to unspecified organism [facility])      NEW MEDICATIONS STARTED DURING THIS VISIT:  New Prescriptions   No medications on file     Note:  This document was prepared using Dragon voice recognition software and may include  unintentional dictation errors.    Willy Eddy, MD 01/06/18 2205

## 2018-01-06 NOTE — Progress Notes (Signed)
CODE SEPSIS - PHARMACY COMMUNICATION  **Broad Spectrum Antibiotics should be administered within 1 hour of Sepsis diagnosis**  Time Code Sepsis Called/Page Received: 20:57  Antibiotics Ordered: Cefepime/Vancomycin  Time of 1st antibiotic administration: 21:44   Stormy CardKatsoudas,Dajae Kizer K, [facility] Clinical Pharmacist  01/06/2018  9:51 PM

## 2018-01-06 NOTE — ED Notes (Signed)
One set of blood cultures obtained. Attempt x 4 to obtain 2nd set

## 2018-01-06 NOTE — ED Notes (Signed)
Dr Anne HahnWillis notified of O2 sat and blood pressure. See new orders.

## 2018-01-07 ENCOUNTER — Inpatient Hospital Stay: Payer: Medicaid Other

## 2018-01-07 LAB — CBC
HCT: 43 % (ref 40.0–52.0)
Hemoglobin: 13.3 g/dL (ref 13.0–18.0)
MCH: 27.4 pg (ref 26.0–34.0)
MCHC: 31.1 g/dL — AB (ref 32.0–36.0)
MCV: 88.1 fL (ref 80.0–100.0)
PLATELETS: 127 10*3/uL — AB (ref 150–440)
RBC: 4.88 MIL/uL (ref 4.40–5.90)
RDW: 18.2 % — AB (ref 11.5–14.5)
WBC: 0.8 10*3/uL — AB (ref 3.8–10.6)

## 2018-01-07 LAB — BASIC METABOLIC PANEL
ANION GAP: 10 (ref 5–15)
BUN: 58 mg/dL — ABNORMAL HIGH (ref 6–20)
CHLORIDE: 106 mmol/L (ref 101–111)
CO2: 21 mmol/L — ABNORMAL LOW (ref 22–32)
Calcium: 6.9 mg/dL — ABNORMAL LOW (ref 8.9–10.3)
Creatinine, Ser: 3.06 mg/dL — ABNORMAL HIGH (ref 0.61–1.24)
GFR calc Af Amer: 25 mL/min — ABNORMAL LOW (ref >60)
GFR, EST NON AFRICAN AMERICAN: 21 mL/min — AB (ref >60)
Glucose, Bld: 118 mg/dL — ABNORMAL HIGH (ref 65–99)
POTASSIUM: 4 mmol/L (ref 3.5–5.1)
SODIUM: 137 mmol/L (ref 135–145)

## 2018-01-07 LAB — BLOOD GAS, ARTERIAL
ACID-BASE DEFICIT: 14.6 mmol/L — AB (ref 0.0–2.0)
Acid-base deficit: 10.8 mmol/L — ABNORMAL HIGH (ref 0.0–2.0)
BICARBONATE: 17.4 mmol/L — AB (ref 20.0–28.0)
Bicarbonate: 17.5 mmol/L — ABNORMAL LOW (ref 20.0–28.0)
FIO2: 1
FIO2: 1
MECHANICAL RATE: 19
Mechanical Rate: 12
O2 SAT: 57.8 %
O2 SAT: 75 %
PATIENT TEMPERATURE: 37
PATIENT TEMPERATURE: 37
PCO2 ART: 47 mmHg (ref 32.0–48.0)
PEEP/CPAP: 5 cmH2O
PEEP: 5 cmH2O
PH ART: 7.01 — AB (ref 7.350–7.450)
PO2 ART: 39 mmHg — AB (ref 83.0–108.0)
PO2 ART: 61 mmHg — AB (ref 83.0–108.0)
VT: 500 mL
VT: 500 mL
pCO2 arterial: 69 mmHg (ref 32.0–48.0)
pH, Arterial: 7.18 — CL (ref 7.350–7.450)

## 2018-01-07 LAB — LACTIC ACID, PLASMA
LACTIC ACID, VENOUS: 5.1 mmol/L — AB (ref 0.5–1.9)
Lactic Acid, Venous: 4.3 mmol/L (ref 0.5–1.9)
Lactic Acid, Venous: 4.7 mmol/L (ref 0.5–1.9)

## 2018-01-07 LAB — TROPONIN I
TROPONIN I: 0.03 ng/mL — AB (ref <0.03)
TROPONIN I: 0.03 ng/mL — AB (ref <0.03)
Troponin I: 0.03 ng/mL (ref <0.03)

## 2018-01-07 LAB — STREP PNEUMONIAE URINARY ANTIGEN: STREP PNEUMO URINARY ANTIGEN: NEGATIVE

## 2018-01-07 LAB — BRAIN NATRIURETIC PEPTIDE: B Natriuretic Peptide: 37 pg/mL (ref 0.0–100.0)

## 2018-01-07 LAB — POCT I-STAT CREATININE: CREATININE: 2.6 mg/dL — AB (ref 0.61–1.24)

## 2018-01-07 LAB — ABO/RH: ABO/RH(D): O POS

## 2018-01-07 LAB — MRSA PCR SCREENING: MRSA BY PCR: POSITIVE — AB

## 2018-01-07 MED ORDER — VASOPRESSIN 20 UNIT/ML IV SOLN
0.0300 [IU]/min | INTRAVENOUS | Status: DC
  Administered 2018-01-07 – 2018-01-11 (×5): 0.03 [IU]/min via INTRAVENOUS
  Filled 2018-01-07 (×7): qty 2

## 2018-01-07 MED ORDER — SODIUM CHLORIDE 0.9 % IV SOLN: 0.0000 ug/min | INTRAVENOUS | Status: DC

## 2018-01-07 MED ORDER — SODIUM BICARBONATE 8.4 % IV SOLN
100.0000 meq | Freq: Once | INTRAVENOUS | Status: AC
  Administered 2018-01-07: 100 meq via INTRAVENOUS
  Filled 2018-01-07: qty 100

## 2018-01-07 MED ORDER — SODIUM CHLORIDE 0.9 % IV SOLN
3.0000 g | Freq: Four times a day (QID) | INTRAVENOUS | Status: DC
  Administered 2018-01-07: 3 g via INTRAVENOUS
  Filled 2018-01-07 (×4): qty 3

## 2018-01-07 MED ORDER — SODIUM CHLORIDE 0.9 % IV BOLUS
1000.0000 mL | Freq: Once | INTRAVENOUS | Status: AC
  Administered 2018-01-07: 1000 mL via INTRAVENOUS

## 2018-01-07 MED ORDER — LORAZEPAM 2 MG/ML IJ SOLN
2.0000 mg | INTRAMUSCULAR | Status: DC | PRN
  Administered 2018-01-08: 4 mg via INTRAVENOUS
  Filled 2018-01-07: qty 2

## 2018-01-07 MED ORDER — SODIUM BICARBONATE 8.4 % IV SOLN
INTRAVENOUS | Status: DC
  Administered 2018-01-07 – 2018-01-08 (×3): via INTRAVENOUS
  Filled 2018-01-07 (×6): qty 150

## 2018-01-07 MED ORDER — CHLORHEXIDINE GLUCONATE 0.12% ORAL RINSE (MEDLINE KIT)
15.0000 mL | Freq: Two times a day (BID) | OROMUCOSAL | Status: DC
  Administered 2018-01-07 – 2018-03-09 (×119): 15 mL via OROMUCOSAL

## 2018-01-07 MED ORDER — ORAL CARE MOUTH RINSE
15.0000 mL | OROMUCOSAL | Status: DC
  Administered 2018-01-07 – 2018-03-19 (×654): 15 mL via OROMUCOSAL

## 2018-01-07 MED ORDER — SODIUM CHLORIDE 0.9 % IV SOLN
3.0000 g | Freq: Two times a day (BID) | INTRAVENOUS | Status: DC
  Administered 2018-01-07: 3 g via INTRAVENOUS
  Filled 2018-01-07 (×2): qty 3

## 2018-01-07 MED ORDER — VECURONIUM BROMIDE 10 MG IV SOLR
INTRAVENOUS | Status: AC
  Filled 2018-01-07: qty 10

## 2018-01-07 MED ORDER — ACETAMINOPHEN 650 MG RE SUPP
650.0000 mg | Freq: Four times a day (QID) | RECTAL | Status: DC | PRN
  Administered 2018-01-07 – 2018-01-08 (×2): 650 mg via RECTAL
  Filled 2018-01-07 (×3): qty 1

## 2018-01-07 MED ORDER — VECURONIUM BROMIDE 10 MG IV SOLR
10.0000 mg | INTRAVENOUS | Status: DC | PRN
  Filled 2018-01-07: qty 10

## 2018-01-07 MED ORDER — SODIUM CHLORIDE 0.9 % IV SOLN
0.0000 ug/min | INTRAVENOUS | Status: DC
  Administered 2018-01-07 (×7): 400 ug/min via INTRAVENOUS
  Administered 2018-01-07 (×2): 350 ug/min via INTRAVENOUS
  Administered 2018-01-07 (×2): 400 ug/min via INTRAVENOUS
  Administered 2018-01-07: 170 ug/min via INTRAVENOUS
  Administered 2018-01-08: 390 ug/min via INTRAVENOUS
  Administered 2018-01-08 (×2): 375 ug/min via INTRAVENOUS
  Administered 2018-01-08: 345 ug/min via INTRAVENOUS
  Administered 2018-01-08: 375 ug/min via INTRAVENOUS
  Administered 2018-01-08: 325 ug/min via INTRAVENOUS
  Administered 2018-01-08: 400 ug/min via INTRAVENOUS
  Administered 2018-01-08: 380 ug/min via INTRAVENOUS
  Administered 2018-01-08: 375 ug/min via INTRAVENOUS
  Administered 2018-01-08: 345 ug/min via INTRAVENOUS
  Administered 2018-01-09 (×3): 300 ug/min via INTRAVENOUS
  Administered 2018-01-09: 200 ug/min via INTRAVENOUS
  Administered 2018-01-09 (×2): 300 ug/min via INTRAVENOUS
  Administered 2018-01-09: 270 ug/min via INTRAVENOUS
  Administered 2018-01-09 (×3): 300 ug/min via INTRAVENOUS
  Administered 2018-01-10: 270 ug/min via INTRAVENOUS
  Administered 2018-01-10 (×7): 300 ug/min via INTRAVENOUS
  Administered 2018-01-11: 120 ug/min via INTRAVENOUS
  Administered 2018-01-11: 40 ug/min via INTRAVENOUS
  Administered 2018-01-12 (×2): 50 ug/min via INTRAVENOUS
  Filled 2018-01-07 (×6): qty 40
  Filled 2018-01-07 (×2): qty 4
  Filled 2018-01-07 (×5): qty 40
  Filled 2018-01-07: qty 4
  Filled 2018-01-07 (×2): qty 40
  Filled 2018-01-07: qty 4
  Filled 2018-01-07 (×3): qty 40
  Filled 2018-01-07: qty 4
  Filled 2018-01-07 (×4): qty 40
  Filled 2018-01-07: qty 4
  Filled 2018-01-07: qty 40
  Filled 2018-01-07: qty 4
  Filled 2018-01-07 (×18): qty 40

## 2018-01-07 MED ORDER — VECURONIUM BROMIDE 10 MG IV SOLR
10.0000 mg | Freq: Once | INTRAVENOUS | Status: AC
  Administered 2018-01-07: 10 mg via INTRAVENOUS

## 2018-01-07 MED ORDER — HYDROCORTISONE NA SUCCINATE PF 100 MG IJ SOLR
50.0000 mg | Freq: Four times a day (QID) | INTRAMUSCULAR | Status: DC
  Administered 2018-01-07 – 2018-01-13 (×25): 50 mg via INTRAVENOUS
  Filled 2018-01-07 (×25): qty 2

## 2018-01-07 MED ORDER — PANTOPRAZOLE SODIUM 40 MG IV SOLR
40.0000 mg | Freq: Every day | INTRAVENOUS | Status: DC
  Administered 2018-01-07 – 2018-01-09 (×3): 40 mg via INTRAVENOUS
  Filled 2018-01-07 (×3): qty 40

## 2018-01-07 MED ORDER — NOREPINEPHRINE 4 MG/250ML-% IV SOLN
0.0000 ug/min | INTRAVENOUS | Status: DC
  Administered 2018-01-07: 8 ug/min via INTRAVENOUS
  Filled 2018-01-07 (×2): qty 250

## 2018-01-07 MED ORDER — SODIUM CHLORIDE 0.9 % IV SOLN
3.0000 g | Freq: Two times a day (BID) | INTRAVENOUS | Status: DC
  Administered 2018-01-08: 3 g via INTRAVENOUS
  Filled 2018-01-07 (×2): qty 3

## 2018-01-07 MED ORDER — NOREPINEPHRINE BITARTRATE 1 MG/ML IV SOLN
0.0000 ug/min | INTRAVENOUS | Status: DC
  Administered 2018-01-07 (×2): 30 ug/min via INTRAVENOUS
  Administered 2018-01-07: 70 ug/min via INTRAVENOUS
  Administered 2018-01-08: 15 ug/min via INTRAVENOUS
  Administered 2018-01-08: 45 ug/min via INTRAVENOUS
  Filled 2018-01-07 (×6): qty 16

## 2018-01-07 MED ORDER — HEPARIN SODIUM (PORCINE) 5000 UNIT/ML IJ SOLN
5000.0000 [IU] | Freq: Three times a day (TID) | INTRAMUSCULAR | Status: DC
  Administered 2018-01-07 – 2018-01-12 (×15): 5000 [IU] via SUBCUTANEOUS
  Filled 2018-01-07 (×15): qty 1

## 2018-01-07 NOTE — Progress Notes (Signed)
Dr Belia HemanKasa aware of patients heart rate in the 120-130's. Patient is maxed out on pressors. 100 ml of urine output for prior 12 hour shift. Nephrology consult. Per MD no wake up assessment today. Started bicarb and stopped ns.

## 2018-01-07 NOTE — Progress Notes (Signed)
eLink Physician-Brief Progress Note Patient Name: Tommy Mathews Check DOB: [birth year] MRN: [id number]   Date of Service  01/07/2018  HPI/Events of Note  Hypotension - BP = 66/46.   eICU Interventions  Will order: 1. Norepinephrine IV infusion. 2. Monitor CVP. 3. ABG STAT.      Intervention Category Major Interventions: Hypotension - evaluation and management  Sommer,Steven Eugene 01/07/2018, 1:02 AM

## 2018-01-07 NOTE — Consult Note (Signed)
   CHIEF COMPLAINT:   Chief Complaint  Patient presents with  . Respiratory Distress    Subjective  Severe hypoxia Patient was given vec and RR decreased to 12 due to severe air trapping Patient on multiple vasopressors(shock)  Patient intubated,sedated CT chest reviewed b/l opacities c/w pneumonia   UNABLE TO PROVIDE ROS DUE TO CRITICAL ILLNESS     Objective   PHYSICAL EXAMINATION:  GENERAL:critically ill appearing, +resp distress HEAD: Normocephalic, atraumatic.  EYES: Pupils equal, round, reactive to light.  No scleral icterus.  MOUTH: Moist mucosal membrane. NECK: Supple. No thyromegaly. No nodules. No JVD.  PULMONARY: +rhonchi, +wheezing CARDIOVASCULAR: S1 and S2. Regular rate and rhythm. No murmurs, rubs, or gallops.  GASTROINTESTINAL: Soft, nontender, -distended. No masses. Positive bowel sounds. No hepatosplenomegaly.  MUSCULOSKELETAL: No swelling, clubbing, or edema.  NEUROLOGIC: obtunded SKIN:intact,warm,dry    ETT 4/8 LEFT GROIN CVL 4/8  Anti-infectives (From admission, onward)   Start     Dose/Rate Route Frequency Ordered Stop   01/07/18 2100  ceFEPIme (MAXIPIME) 2 g in sodium chloride 0.9 % 100 mL IVPB     2 g 200 mL/hr over 30 Minutes Intravenous Every 24 hours 01/06/18 2349     01/07/18 0600  vancomycin (VANCOCIN) 1,250 mg in sodium chloride 0.9 % 250 mL IVPB     1,250 mg 166.7 mL/hr over 90 Minutes Intravenous Every 24 hours 01/06/18 2349     01/06/18 2100  ceFEPIme (MAXIPIME) 2 g in sodium chloride 0.9 % 100 mL IVPB     2 g 200 mL/hr over 30 Minutes Intravenous  Once 01/06/18 2056 01/06/18 2320   01/06/18 2100  vancomycin (VANCOCIN) IVPB 1000 mg/200 mL premix     1,000 mg 200 mL/hr over 60 Minutes Intravenous  Once 01/06/18 2056 01/07/18 0024       VITALS:  height is 5\' 10"  (1.778 m) and weight is 236 lb 12.4 oz (107.4 kg). His temperature is 100.6 F (38.1 C) (abnormal). His blood pressure is 65/48 (abnormal) and his pulse is 132  (abnormal). His respiration is 12 and oxygen saturation is 58% (abnormal).   I personally reviewed Labs under Results section.  Radiology-CXR and CT scans reviewed     Assessment/Plan:  57 yo AAM with acute and severe resp failure with severe septic shock with b/l pneumonia recurring vent support  1.Respiratory Failure -continue Full MV support -continue Bronchodilator Therapy -Wean Fio2 and PEEP as tolerated  2.Renal Failure-most likely due to ATN -follow chem 7 -follow UO -continue Foley Catheter-assess need  3.NEURO - intubated and sedated - minimal sedation to achieve a RASS goal: -1   4.Septic shock -use vasopressors to keep MAP>65 -follow ABG and LA -follow up cultures -emperic ABX -stress dose steroids    Critical Care Time devoted to patient care services described in this note is 35 minutes.   Overall, patient is critically ill, prognosis is guarded.  Patient with Multiorgan failure and at high risk for cardiac arrest and death.    Lucie LeatherKurian David Jonnie Truxillo, M.D.  [facility]  Medical Director [facility] Medical Director [facility]

## 2018-01-07 NOTE — Progress Notes (Signed)
eLink Physician-Brief Progress Note Patient Name: Tommy Mathews DOB: [birth year] MRN: [id number]   Date of Service  01/07/2018  HPI/Events of Note  ABG on 100%/PRVC 19 /TV 500/P 5 = 7.18/47/39/17.5 (Venous ABG?)  eICU Interventions  Will order: 1. Increase PRVC rate to 30. 2. Increase PEEP to 10. 3 . Repeat ABG at 3:30 AM.     Intervention Category Major Interventions: Hypoxemia - evaluation and management;Respiratory failure - evaluation and management  Lilliann Rossetti Eugene 01/07/2018, 1:59 AM

## 2018-01-07 NOTE — Progress Notes (Signed)
Paged Dr Belia HemanKasa regarding patients WBC 0.8

## 2018-01-07 NOTE — Progress Notes (Signed)
Ice bags applied to patient to help with fever.

## 2018-01-07 NOTE — Progress Notes (Signed)
[employer] - [place] [facility]   PATIENT NAME: Tommy Mathews    MR#:  [id number]  DATE OF BIRTH:  Dec 23, 1960  SUBJECTIVE:  CHIEF COMPLAINT:   Chief Complaint  Patient presents with  . Respiratory Distress   The patient is on ventilation and 3 maximum doses of pressor drips. REVIEW OF SYSTEMS:  Review of Systems  Unable to perform ROS: Intubated    DRUG ALLERGIES:  Not on File VITALS:  Blood pressure (!) 86/67, pulse (!) 122, temperature 98.8 F (37.1 C), resp. rate (!) 0, height 5\' 10"  (1.778 m), weight 236 lb 12.4 oz (107.4 kg), SpO2 (!) 76 %. PHYSICAL EXAMINATION:  Physical Exam  HENT:  Head: Normocephalic.  Eyes: No scleral icterus.  Bilateral pupils are pinpoint, not reactive to light.  Neck: No JVD present. No tracheal deviation present.  Cardiovascular: Regular rhythm and normal heart sounds. Exam reveals no gallop.  No murmur heard. Pulmonary/Chest:  Bilateral crackles  Abdominal: Soft. He exhibits no distension.  Hypoactive bowel sounds  Musculoskeletal: He exhibits no edema or tenderness.  Neurological:  Unable to exam.  Skin: No rash noted. No erythema.   LABORATORY PANEL:  Male CBC Recent Labs  Lab 01/07/18 0601  WBC 0.8*  HGB 13.3  HCT 43.0  PLT 127*   ------------------------------------------------------------------------------------------------------------------ Chemistries  Recent Labs  Lab 01/06/18 1931  01/07/18 0047  NA 136  --  137  K 3.9  --  4.0  CL 99*  --  106  CO2 19*  --  21*  GLUCOSE 149*  --  118*  BUN 57*  --  58*  CREATININE 3.46*   < > 3.06*  CALCIUM 8.6*  --  6.9*  AST 32  --   --   ALT 14*  --   --   ALKPHOS 49  --   --   BILITOT 0.6  --   --    < > = values in this interval not displayed.   RADIOLOGY:  Ct Abdomen Pelvis Wo Contrast  Result Date: 01/06/2018 CLINICAL DATA:  Respiratory arrest.  Suspect pulmonary embolism. EXAM: CT CHEST, ABDOMEN AND PELVIS WITHOUT CONTRAST TECHNIQUE:  Multidetector CT imaging of the chest, abdomen and pelvis was performed following the standard protocol without IV contrast. COMPARISON:  Chest radiograph January 06, 2018 FINDINGS: CT CHEST FINDINGS CARDIOVASCULAR: Heart and pericardium are unremarkable. Thoracic aorta is normal course and caliber, mild calcific atherosclerosis. MEDIASTINUM/NODES: No mediastinal mass. No lymphadenopathy by CT size criteria limited assessment without contrast. Normal appearance of thoracic esophagus though not tailored for evaluation. LUNGS/PLEURA: Tracheobronchial tree is patent, no pneumothorax. Endotracheal tube tip above the carina. Bilateral tree-in-bud infiltrates and dense consolidation with air bronchograms. RIGHT upper lobe collapse with soft tissue effacing RIGHT upper lobe bronchus. MUSCULOSKELETAL: Included soft tissues and included osseous structures appear normal. CT ABDOMEN PELVIS FINDINGS HEPATOBILIARY: Subcentimeter layering gallstones without CT findings of acute cholecystitis by noncontrast CT. Trace perihepatic focal fat. PANCREAS: Normal. SPLEEN: Normal. ADRENALS/URINARY TRACT: Kidneys are orthotopic, demonstrating normal size and morphology. No nephrolithiasis, hydronephrosis; limited assessment for renal masses on this nonenhanced examination. The unopacified ureters are normal in course and caliber. Urinary bladder decompressed by Foley catheter. Normal adrenal glands. STOMACH/BOWEL: Nasogastric tube terminates in distal stomach. Gas distended small bowel measuring to 3.8 cm with multiple transition points in air-fluid levels. Large bowel normal in course and caliber. Normal appendix. VASCULAR/LYMPHATIC: Mildly ectatic 2.4 cm infrarenal aorta. Mild calcific atherosclerosis. No lymphadenopathy by CT size criteria. REPRODUCTIVE:  Normal. OTHER: No intraperitoneal free fluid or free air. MUSCULOSKELETAL: Non-acute. Small fat containing LEFT inguinal hernia. Mild L5-S1 degenerative disc. IMPRESSION: CT CHEST: 1.  Pulmonary embolism cannot be excluded by noncontrast CT. 2. Multifocal severe pneumonia with RIGHT upper lobe collapse and soft tissue obstructing RIGHT upper lobe bronchus. Consider bronchoscopy. 3. Endotracheal tube tip above the carina. CT ABDOMEN AND PELVIS: 1. Mild small bowel ileus, potential enteritis. Nasogastric tube terminates in distal stomach. 2. Cholelithiasis without CT findings of acute cholecystitis. Aortic Atherosclerosis (ICD10-I70.0). Electronically Signed   By: Awilda Metroourtnay  Bloomer M.D.   On: 01/06/2018 21:44   Ct Chest Wo Contrast  Result Date: 01/06/2018 CLINICAL DATA:  Respiratory arrest.  Suspect pulmonary embolism. EXAM: CT CHEST, ABDOMEN AND PELVIS WITHOUT CONTRAST TECHNIQUE: Multidetector CT imaging of the chest, abdomen and pelvis was performed following the standard protocol without IV contrast. COMPARISON:  Chest radiograph January 06, 2018 FINDINGS: CT CHEST FINDINGS CARDIOVASCULAR: Heart and pericardium are unremarkable. Thoracic aorta is normal course and caliber, mild calcific atherosclerosis. MEDIASTINUM/NODES: No mediastinal mass. No lymphadenopathy by CT size criteria limited assessment without contrast. Normal appearance of thoracic esophagus though not tailored for evaluation. LUNGS/PLEURA: Tracheobronchial tree is patent, no pneumothorax. Endotracheal tube tip above the carina. Bilateral tree-in-bud infiltrates and dense consolidation with air bronchograms. RIGHT upper lobe collapse with soft tissue effacing RIGHT upper lobe bronchus. MUSCULOSKELETAL: Included soft tissues and included osseous structures appear normal. CT ABDOMEN PELVIS FINDINGS HEPATOBILIARY: Subcentimeter layering gallstones without CT findings of acute cholecystitis by noncontrast CT. Trace perihepatic focal fat. PANCREAS: Normal. SPLEEN: Normal. ADRENALS/URINARY TRACT: Kidneys are orthotopic, demonstrating normal size and morphology. No nephrolithiasis, hydronephrosis; limited assessment for renal masses on  this nonenhanced examination. The unopacified ureters are normal in course and caliber. Urinary bladder decompressed by Foley catheter. Normal adrenal glands. STOMACH/BOWEL: Nasogastric tube terminates in distal stomach. Gas distended small bowel measuring to 3.8 cm with multiple transition points in air-fluid levels. Large bowel normal in course and caliber. Normal appendix. VASCULAR/LYMPHATIC: Mildly ectatic 2.4 cm infrarenal aorta. Mild calcific atherosclerosis. No lymphadenopathy by CT size criteria. REPRODUCTIVE: Normal. OTHER: No intraperitoneal free fluid or free air. MUSCULOSKELETAL: Non-acute. Small fat containing LEFT inguinal hernia. Mild L5-S1 degenerative disc. IMPRESSION: CT CHEST: 1. Pulmonary embolism cannot be excluded by noncontrast CT. 2. Multifocal severe pneumonia with RIGHT upper lobe collapse and soft tissue obstructing RIGHT upper lobe bronchus. Consider bronchoscopy. 3. Endotracheal tube tip above the carina. CT ABDOMEN AND PELVIS: 1. Mild small bowel ileus, potential enteritis. Nasogastric tube terminates in distal stomach. 2. Cholelithiasis without CT findings of acute cholecystitis. Aortic Atherosclerosis (ICD10-I70.0). Electronically Signed   By: Awilda Metroourtnay  Bloomer M.D.   On: 01/06/2018 21:44   Dg Chest Port 1 View  Result Date: 01/07/2018 CLINICAL DATA:  Acute respiratory failure.  Hypoxia. EXAM: PORTABLE CHEST 1 VIEW COMPARISON:  CT 6 hours prior, radiographs 8 hours prior. FINDINGS: Endotracheal tube 3.9 cm from the carina. Enteric tube in place with tip below the diaphragm, not included in the field of view. Progressive bilateral lung opacities, greatest in the lung bases with air bronchograms. Heart appears normal in size. No large pleural effusion. No pneumothorax. IMPRESSION: Progressive bilateral lung opacities, greatest in the lung bases, may reflect pneumonia or aspiration. Pulmonary edema is felt less likely. Electronically Signed   By: Rubye OaksMelanie  Ehinger M.D.   On: 01/07/2018  04:01   Dg Chest Portable 1 View  Result Date: 01/06/2018 CLINICAL DATA:  ETT placement EXAM: PORTABLE CHEST 1 VIEW COMPARISON:  None. FINDINGS:  Endotracheal tube tip is about 2.4 cm superior to the carina. Low lung volumes. No pleural effusion or pneumothorax. Heart size within normal limits. Streaky left greater than right hilar opacity. IMPRESSION: 1. Endotracheal tube tip about 2.4 cm superior to the carina 2. Low lung volumes. Streaky left greater than right perihilar opacity of uncertain chronicity. Electronically Signed   By: Jasmine Pang M.D.   On: 01/06/2018 19:35   ASSESSMENT AND PLAN:   Severe sepsis with septic shock,  due to his pneumonia and UTI Continue 3 pressors drip, maximum dose is now, continue broad-spectrum antibiotics, follow-up cultures.    Lactic acidosis.  Lactic acid very elevated, aggressive IV fluid resuscitation  Neutropenia.  Possible due to above.  Follow-up CBC.  GI bleed hold Xarelto, PPI drip, GI consult, PRBC transfusion given in the ED    Acute respiratory failure with hypoxia (HCC) -intubated and mechanically ventilated, likely due to his pneumonia and a possible aspiration event   Multifocal pneumonia -IV antibiotics as above, intubated as above, supportive treatment PRN    AKI (acute kidney injury), worsening, continue aggressive IV fluids as above, follow-up BMP.    Ileus (HCC) -patient has OG tube in place to suction   Diabetes (HCC) -sliding scale insulin with corresponding glucose checks  All the records are reviewed and case discussed with Care Management/Social Worker. Management plans discussed with the patient's sister and they are in agreement.  CODE STATUS: Full Code  TOTAL TIME TAKING CARE OF THIS PATIENT: 38 minutes.   More than 50% of the time was spent in counseling/coordination of care: YES  POSSIBLE D/C IN ? DAYS, DEPENDING ON CLINICAL CONDITION.   Shaune Pollack M.D on 01/07/2018 at 2:25 PM  Between 7am to 6pm - Pager -  [phone]  After 6pm go to [email or web address] - Therapist, [facility] Hospital[facility]

## 2018-01-07 NOTE — Progress Notes (Signed)
Notified Dr Belia HemanKasa regarding patients lactic acid 5.1. Orders for 1 liter bolus in addition to the fluids he is already receiving.

## 2018-01-07 NOTE — Consult Note (Signed)
Cephas Darby, MD [address]  [place]  [place], [place] [address]  Main: [phone]  Fax: [phone] Pager: [phone]   Consultation  Referring Provider:     No ref. provider found Primary Care Physician:  Patient, [name] Primary Gastroenterologist: None         Reason for Consultation:     Coffee ground emesis  Date of Admission:  01/06/2018 Date of Consultation:  01/08/2018         HPI:   Tommy Mathews is a 57 y.o. male admitted with respiratory arrest from multifocal PNA and resulting in septic shock, AKI on multiple pressors. He was found to have coffee ground emesis on admission. Pt has h/o DM, HTN, HLD, on depakote, MTX and xarelto for unclear indications. Pt is brought by EMS from assisted living facility. His b has unchanged since admission. He had 200cc blackish material from NG tube in last 24hrs. He had CT chest/A/P which did not reveal cirrhosis, portal HTN, acute GI pathology. He was initially on protonix and octreotide drips on admission, switched to protonix BID today. He is intubated and sedated, unresponsive. Pt did not have BM since admission. No evidence of hematemesis, rectal bleeding. GI is consulted for possible endoscopic evaluation  NSAIDs: unknown  Antiplts/Anticoagulants/Anti thrombotics: xarelto  GI Procedures: unknown  Past Medical History:  Diagnosis Date  . Diabetes ([facility])   . GERD (gastroesophageal reflux disease)   . HLD (hyperlipidemia)   . HTN (hypertension)     Current Facility-Administered Medications:  .  0.9 %  sodium chloride infusion, 250 mL, Intravenous, PRN, Lance Coon, MD .  acetaminophen (TYLENOL) suppository 650 mg, 650 mg, Rectal, Q6H PRN, Anders Simmonds, MD, 650 mg at 01/07/18 2008 .  Ampicillin-Sulbactam (UNASYN) 3 g in sodium chloride 0.9 % 100 mL IVPB, 3 g, Intravenous, Q12H, Demetrios Loll, MD .  chlorhexidine gluconate (MEDLINE KIT) (PERIDEX) 0.12 % solution 15 mL, 15 mL, Mouth Rinse, BID, Kasa,  Kurian, MD, 15 mL at 01/07/18 2002 .  fentaNYL 2537mg in NS 2517m(1035mml) infusion-PREMIX, 0-400 mcg/hr, Intravenous, Continuous, WilLance CoonD, Last Rate: 20 mL/hr at 01/07/18 2000, 200 mcg/hr at 01/07/18 2000 .  heparin injection 5,000 Units, 5,000 Units, Subcutaneous, Q8H, Kasa, Kurian, MD, 5,000 Units at 01/07/18 2123 .  hydrocortisone sodium succinate (SOLU-CORTEF) 100 MG injection 50 mg, 50 mg, Intravenous, Q6H, Kasa, Kurian, MD, 50 mg at 01/07/18 2200 .  LORazepam (ATIVAN) injection 2-4 mg, 2-4 mg, Intravenous, Q1H PRN, KasMortimer Friesurian, MD .  MEDLINE mouth rinse, 15 mL, Mouth Rinse, 10 times per day, KasFlora LippsD, 15 mL at 01/07/18 2318 .  norepinephrine (LEVOPHED) 16 mg in dextrose 5 % 250 mL (0.064 mg/mL) infusion, 0-80 mcg/min, Intravenous, Titrated, WilLance CoonD, Last Rate: 28.1 mL/hr at 01/07/18 2212, 30 mcg/min at 01/07/18 2212 .  ondansetron (ZOFRAN) injection 4 mg, 4 mg, Intravenous, Q6H PRN, WilLance CoonD .  pantoprazole (PROTONIX) injection 40 mg, 40 mg, Intravenous, Daily, Kasa, Kurian, MD, 40 mg at 01/07/18 1055 .  phenylephrine (NEO-SYNEPHRINE) 40 mg in sodium chloride 0.9 % 250 mL (0.16 mg/mL) infusion, 0-400 mcg/min, Intravenous, Titrated, WilLance CoonD, Last Rate: 150 mL/hr at 01/07/18 2317, 400 mcg/min at 01/07/18 2317 .  sodium bicarbonate 150 mEq in sterile water 1,000 mL infusion, , Intravenous, Continuous, Kasa, Kurian, MD, Last Rate: 100 mL/hr at 01/07/18 2000 .  vasopressin (PITRESSIN) 40 Units in sodium chloride 0.9 % 250 mL (0.16 Units/mL) infusion, 0.03 Units/min,  Intravenous, Continuous, Anders Simmonds, MD, Last Rate: 11.3 mL/hr at 01/07/18 2000, 0.03 Units/min at 01/07/18 2000 .  vecuronium (NORCURON) injection 10 mg, 10 mg, Intravenous, Q30 min PRN, Flora Lipps, MD   Prior to Admission medications   Medication Sig Start Date End Date Taking? Authorizing Provider  atorvastatin (LIPITOR) 80 MG tablet Take 80 mg by mouth at bedtime.   Yes  [provider]  divalproex (DEPAKOTE ER) 500 MG 24 hr tablet Take 500 mg by mouth 2 (two) times daily.   Yes [provider]  estradiol (ESTRACE) 2 MG tablet Take 2 mg by mouth daily.   Yes [provider]  FLUoxetine (PROZAC) 40 MG capsule Take 40 mg by mouth daily.   Yes [provider]  folic acid (FOLVITE) 157 MCG tablet Take 800 mcg by mouth daily.   Yes [provider]  hydrochlorothiazide (HYDRODIURIL) 50 MG tablet Take 50 mg by mouth daily.   Yes [provider]  lisinopril (PRINIVIL,ZESTRIL) 5 MG tablet Take 5 mg by mouth daily.   Yes [provider]  metFORMIN (GLUCOPHAGE) 1000 MG tablet Take 1,000 mg by mouth 2 (two) times daily with a meal.   Yes [provider]  methotrexate (RHEUMATREX) 2.5 MG tablet Take 15 mg by mouth once a week.   Yes [provider]  metoprolol tartrate (LOPRESSOR) 50 MG tablet Take 50 mg by mouth 2 (two) times daily.   Yes [provider]  Omega-3 Fatty Acids (FISH OIL) 1000 MG CAPS Take 1,000 mg by mouth daily.   Yes [provider]  QUEtiapine Fumarate (SEROQUEL PO) Take 500 mg by mouth 2 (two) times daily.   Yes [provider]  ranitidine (ZANTAC) 150 MG tablet Take 150 mg by mouth daily.   Yes [provider]  rivaroxaban (XARELTO) 10 MG TABS tablet Take 10 mg by mouth daily.   Yes [provider]  senna-docusate (SENOKOT-S) 8.6-50 MG tablet Take 2 tablets by mouth at bedtime.   Yes [provider]  sodium chloride 1 g tablet Take 1 g by mouth daily.   Yes [provider]    No family history on file.   Social History   Tobacco Use  . Smoking status: Unknown If Ever Smoked  Substance Use Topics  . Alcohol use: Not on file  . Drug use: Not on file    Allergies as of 01/06/2018  . (Not on File)    Review of Systems:    All systems reviewed and negative except where noted in HPI.   Physical Exam:    Vital signs in last 24 hours: Temp:  [98.6 F (37 C)-101.5 F (38.6 C)] 101.5 F (38.6 C) (04/09 2300) Pulse Rate:  [121-137] 133 (04/09 2300) Resp:  [8-34] 20 (04/09 2300) BP: (62-105)/(40-79) 91/71 (04/09 2300) SpO2:  [50 %-86 %] 83 % (04/09 2300) FiO2 (%):  [100 %] 100 % (04/09 2000) Weight:  [236 lb 12.4 oz (107.4 kg)] 236 lb 12.4 oz (107.4 kg) (04/09 0142)   General:  Intubated and sedated Head:  Normocephalic and atraumatic. Eyes:   No icterus. No pallor Neck:  Supple; no masses or thyroidomegaly Lungs: Respirations even and unlabored. Lungs clear to auscultation bilaterally.   No wheezes, crackles, or rhonchi.  Heart:  Tachycardia, Regular rhythm Abdomen:  Soft, nondistended, Normal bowel sounds. No appreciable masses or hepatomegaly.  No rebound or guarding.  Rectal:  Not performed. Msk:  Symmetrical without gross deformities. Extremities:  Without  edema, cyanosis or clubbing. Neurologic:  Intubated and sedated Skin:  Intact without significant lesions or rashes.  LAB RESULTS: CBC Latest Ref Rng & Units 01/07/2018 01/06/2018  WBC 3.8 - 10.6 K/uL 0.8(LL) 8.3  Hemoglobin 13.0 - 18.0 g/dL 13.3 14.5  Hematocrit 40.0 - 52.0 % 43.0 45.5  Platelets 150 - 440 K/uL 127(L) 167    BMET BMP Latest Ref Rng & Units 01/07/2018 01/06/2018 01/06/2018  Glucose 65 - 99 mg/dL 118(H) - 149(H)  BUN 6 - 20 mg/dL 58(H) - 57(H)  Creatinine 0.61 - 1.24 mg/dL 3.06(H) 2.60(H) 3.46(H)  Sodium 135 - 145 mmol/L 137 - 136  Potassium 3.5 - 5.1 mmol/L 4.0 - 3.9  Chloride 101 - 111 mmol/L 106 - 99(L)  CO2 22 - 32 mmol/L 21(L) - 19(L)  Calcium 8.9 - 10.3 mg/dL 6.9(L) - 8.6(L)    LFT Hepatic Function Latest Ref Rng & Units 01/06/2018  Total Protein 6.5 - 8.1 g/dL 6.6  Albumin 3.5 - 5.0 g/dL 3.2(L)  AST 15 - 41 U/L 32  ALT 17 - 63 U/L 14(L)  Alk Phosphatase 38 - 126 U/L 49  Total Bilirubin 0.3 - 1.2 mg/dL 0.6     STUDIES: Ct Abdomen Pelvis Wo Contrast  Result Date: 01/06/2018 CLINICAL DATA:   Respiratory arrest.  Suspect pulmonary embolism. EXAM: CT CHEST, ABDOMEN AND PELVIS WITHOUT CONTRAST TECHNIQUE: Multidetector CT imaging of the chest, abdomen and pelvis was performed following the standard protocol without IV contrast. COMPARISON:  Chest radiograph January 06, 2018 FINDINGS: CT CHEST FINDINGS CARDIOVASCULAR: Heart and pericardium are unremarkable. Thoracic aorta is normal course and caliber, mild calcific atherosclerosis. MEDIASTINUM/NODES: No mediastinal mass. No lymphadenopathy by CT size criteria limited assessment without contrast. Normal appearance of thoracic esophagus though not tailored for evaluation. LUNGS/PLEURA: Tracheobronchial tree is patent, no pneumothorax. Endotracheal tube tip above the carina. Bilateral tree-in-bud infiltrates and dense consolidation with air bronchograms. RIGHT upper lobe collapse with soft tissue effacing RIGHT upper lobe bronchus. MUSCULOSKELETAL: Included soft tissues and included osseous structures appear normal. CT ABDOMEN PELVIS FINDINGS HEPATOBILIARY: Subcentimeter layering gallstones without CT findings of acute cholecystitis by noncontrast CT. Trace perihepatic focal fat. PANCREAS: Normal. SPLEEN: Normal. ADRENALS/URINARY TRACT: Kidneys are orthotopic, demonstrating normal size and morphology. No nephrolithiasis, hydronephrosis; limited assessment for renal masses on this nonenhanced examination. The unopacified ureters are normal in course and caliber. Urinary bladder decompressed by Foley catheter. Normal adrenal glands. STOMACH/BOWEL: Nasogastric tube terminates in distal stomach. Gas distended small bowel measuring to 3.8 cm with multiple transition points in air-fluid levels. Large bowel normal in course and caliber. Normal appendix. VASCULAR/LYMPHATIC: Mildly ectatic 2.4 cm infrarenal aorta. Mild calcific atherosclerosis. No lymphadenopathy by CT size criteria. REPRODUCTIVE: Normal. OTHER: No intraperitoneal free fluid or free air. MUSCULOSKELETAL:  Non-acute. Small fat containing LEFT inguinal hernia. Mild L5-S1 degenerative disc. IMPRESSION: CT CHEST: 1. Pulmonary embolism cannot be excluded by noncontrast CT. 2. Multifocal severe pneumonia with RIGHT upper lobe collapse and soft tissue obstructing RIGHT upper lobe bronchus. Consider bronchoscopy. 3. Endotracheal tube tip above the carina. CT ABDOMEN AND PELVIS: 1. Mild small bowel ileus, potential enteritis. Nasogastric tube terminates in distal stomach. 2. Cholelithiasis without CT findings of acute cholecystitis. Aortic Atherosclerosis (ICD10-I70.0). Electronically Signed   By: Elon Alas M.D.   On: 01/06/2018 21:44   Ct Chest Wo Contrast  Result Date: 01/06/2018 CLINICAL DATA:  Respiratory arrest.  Suspect pulmonary embolism. EXAM: CT CHEST, ABDOMEN AND PELVIS WITHOUT CONTRAST TECHNIQUE: Multidetector CT imaging of the chest, abdomen and pelvis  was performed following the standard protocol without IV contrast. COMPARISON:  Chest radiograph January 06, 2018 FINDINGS: CT CHEST FINDINGS CARDIOVASCULAR: Heart and pericardium are unremarkable. Thoracic aorta is normal course and caliber, mild calcific atherosclerosis. MEDIASTINUM/NODES: No mediastinal mass. No lymphadenopathy by CT size criteria limited assessment without contrast. Normal appearance of thoracic esophagus though not tailored for evaluation. LUNGS/PLEURA: Tracheobronchial tree is patent, no pneumothorax. Endotracheal tube tip above the carina. Bilateral tree-in-bud infiltrates and dense consolidation with air bronchograms. RIGHT upper lobe collapse with soft tissue effacing RIGHT upper lobe bronchus. MUSCULOSKELETAL: Included soft tissues and included osseous structures appear normal. CT ABDOMEN PELVIS FINDINGS HEPATOBILIARY: Subcentimeter layering gallstones without CT findings of acute cholecystitis by noncontrast CT. Trace perihepatic focal fat. PANCREAS: Normal. SPLEEN: Normal. ADRENALS/URINARY TRACT: Kidneys are orthotopic,  demonstrating normal size and morphology. No nephrolithiasis, hydronephrosis; limited assessment for renal masses on this nonenhanced examination. The unopacified ureters are normal in course and caliber. Urinary bladder decompressed by Foley catheter. Normal adrenal glands. STOMACH/BOWEL: Nasogastric tube terminates in distal stomach. Gas distended small bowel measuring to 3.8 cm with multiple transition points in air-fluid levels. Large bowel normal in course and caliber. Normal appendix. VASCULAR/LYMPHATIC: Mildly ectatic 2.4 cm infrarenal aorta. Mild calcific atherosclerosis. No lymphadenopathy by CT size criteria. REPRODUCTIVE: Normal. OTHER: No intraperitoneal free fluid or free air. MUSCULOSKELETAL: Non-acute. Small fat containing LEFT inguinal hernia. Mild L5-S1 degenerative disc. IMPRESSION: CT CHEST: 1. Pulmonary embolism cannot be excluded by noncontrast CT. 2. Multifocal severe pneumonia with RIGHT upper lobe collapse and soft tissue obstructing RIGHT upper lobe bronchus. Consider bronchoscopy. 3. Endotracheal tube tip above the carina. CT ABDOMEN AND PELVIS: 1. Mild small bowel ileus, potential enteritis. Nasogastric tube terminates in distal stomach. 2. Cholelithiasis without CT findings of acute cholecystitis. Aortic Atherosclerosis (ICD10-I70.0). Electronically Signed   By: Elon Alas M.D.   On: 01/06/2018 21:44   Dg Chest Port 1 View  Result Date: 01/07/2018 CLINICAL DATA:  Acute respiratory failure.  Hypoxia. EXAM: PORTABLE CHEST 1 VIEW COMPARISON:  CT 6 hours prior, radiographs 8 hours prior. FINDINGS: Endotracheal tube 3.9 cm from the carina. Enteric tube in place with tip below the diaphragm, not included in the field of view. Progressive bilateral lung opacities, greatest in the lung bases with air bronchograms. Heart appears normal in size. No large pleural effusion. No pneumothorax. IMPRESSION: Progressive bilateral lung opacities, greatest in the lung bases, may reflect pneumonia  or aspiration. Pulmonary edema is felt less likely. Electronically Signed   By: Jeb Levering M.D.   On: 01/07/2018 04:01   Dg Chest Portable 1 View  Result Date: 01/06/2018 CLINICAL DATA:  ETT placement EXAM: PORTABLE CHEST 1 VIEW COMPARISON:  None. FINDINGS: Endotracheal tube tip is about 2.4 cm superior to the carina. Low lung volumes. No pleural effusion or pneumothorax. Heart size within normal limits. Streaky left greater than right hilar opacity. IMPRESSION: 1. Endotracheal tube tip about 2.4 cm superior to the carina 2. Low lung volumes. Streaky left greater than right perihilar opacity of uncertain chronicity. Electronically Signed   By: Donavan Foil M.D.   On: 01/06/2018 19:35      Impression / Plan:   Tommy Mathews is a 57 y.o. AA male with HTN, HLD, DM, on methotrexate, depakote admitted with acute hypoxic respiratory failure, septic shock from multilobar pneumonia, GI consulted for coffee ground emesis. This was as isolated episode with no active GI bleed and hemodynamically insignificant. There is evidence of cirrhosis or portal HTN  - Continue PPI BID -  Ok to start TFs from GI stand point - Defer endoscopic evaluation at this time as risks overweigh benefits given multiorgan failure from septic shock - Consider MTX induced interstitial pneumonitis, bone marrow suppression  Thank you for involving me in the care of this patient.      LOS: 2 days   Sherri Sear, MD  01/08/2018, 12:06 AM   Note: This dictation was prepared with Dragon dictation along with smaller phrase technology. Any transcriptional errors that result from this process are unintentional.

## 2018-01-07 NOTE — Progress Notes (Signed)
eLink Physician-Brief Progress Note Patient Name: Tommy PangJames Troxler DOB: 1961-01-03 MRN: [id number]   Date of Service  01/07/2018  HPI/Events of Note  Multiple issues: 1. Hypoxia - Sat = 49% by oximetry and 2. Hypotension - BP = 71/47. Currently on ceiling doses of Phenylephrine and Norepinephrine IV infusions.   eICU Interventions  Will order: 1. Increase PEEP to 12.  2. Increase ceiling on Norepinephrine IV infusion to 80 mcg/min. 3. Send AM labs now.  4. Portable CXR STAT.  5. Will notify Dr. Belia HemanKasa of patient's ongoing instability.      Intervention Category Major Interventions: Hypoxemia - evaluation and management;Hypotension - evaluation and management  Sommer,Steven Dennard Nipugene 01/07/2018, 3:04 AM

## 2018-01-07 NOTE — Progress Notes (Signed)
eLink Physician-Brief Progress Note Patient Name: Tommy Mathews DOB: 1961-02-12 MRN: [id number]   Date of Service  01/07/2018  HPI/Events of Note  Portable CXR reveals evolving, dense bilateral basilar infiltrates. No pneumothorax.   eICU Interventions  Will increase PEEP to 14.      Intervention Category Major Interventions: Hypoxemia - evaluation and management  Sommer,Steven Eugene 01/07/2018, 4:14 AM

## 2018-01-07 NOTE — Progress Notes (Signed)
Initial Nutrition Assessment  DOCUMENTATION CODES:   Obesity unspecified  INTERVENTION:  Patient is too unstable for enteral nutrition at this time. There is also concern for mild ileus.  Once patient is hemodynamically stable and appropriate for enteral nutrition recommend initiating Vital High Protein at 45 mL/hr (1080 mL goal daily volume) + Pro-Stat 60 mL BID via OGT. Provides 1480 kcal, 155 grams of protein daily, 907 mL H2O daily.  Also recommend liquid MVI daily per tube with initiation of tube feeds.  NUTRITION DIAGNOSIS:   Inadequate oral intake related to inability to eat as evidenced by NPO status.  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   -Per Nephrology note today no acute indication for dialysis. If condition worsens may require renal replacement therapy. -Per CT Abd/Pelvis 4/8 there is evidence of mild small bowel ileus, potential enteritis.  Patient intubated and sedated. No family members at bedside at time of RD assessment. Abdomen distended today. No weight history in chart to trend.  Access: 18 Fr. OGT placed 4/8; terminates in distal stomach per CT abd/pelvis 4/8; 60 cm at corner of mouth  MAP: 49-85 mmHg; on 3 pressors  Patient is currently intubated on ventilator support MV: 10 L/min Temp (24hrs), Avg:98.9 F (37.2 C), Min:94.8 F (34.9 C), Max:100.8 F (38.2 C)  Propofol: N/A  Medications reviewed and include: Solu-Cortef 50 mg Q6hrs IV, pantoprazole, Unasyn, fentanyl gtt,  norepinephrine gtt at 30 mcg/min, phenylephrine gtt at 400 mcg/min (max), sodium bicarbonate 150 mEq in sterile water at 100 mL/hr, vancomycin, vasopressin gtt at 0.03 units/min.  Labs reviewed: CBG 105, Lactic Acid 5.1 (trending up), WBC 0.8, Platelets 127, CO2 21, Glucose 118, BUN 58, Creatinine 3.06.  I/O: 100 mL UOP overnight; 445 mL UOP so far this shift  Patient does not meet criteria for malnutrition.  Discussed with RN and on rounds. Plan is to hold off on initiation of tube feeds at this time.  NUTRITION - FOCUSED PHYSICAL EXAM:    Most Recent Value  Orbital Region  No depletion  Upper Arm Region  No depletion  Thoracic and Lumbar Region  No depletion  Buccal Region  Unable to assess  Temple Region  Unable to assess [Covidian band in place]  Clavicle Bone Region  No depletion  Clavicle and Acromion Bone Region  No depletion  Scapular Bone Region  Unable to assess  Dorsal Hand  No depletion  Patellar Region  No depletion  Anterior Thigh Region  No depletion  Posterior Calf Region  No depletion  Edema (RD Assessment)  None  Hair  Reviewed  Eyes  Unable to assess  Mouth  Unable to assess  Skin  Reviewed  Nails  Reviewed     Diet Order:  Diet NPO time specified  EDUCATION NEEDS:   Not appropriate for education at this time  Skin:  Skin Assessment: Reviewed RN Assessment  Last BM:  Unknown  Height:   Ht Readings from Last 1 Encounters:  01/07/18 5\' 10"  (1.778 m)    Weight:  Wt Readings from Last 1 Encounters:  01/07/18 236 lb 12.4 oz (107.4 kg)    Ideal Body Weight:  75.5 kg  BMI:  Body mass index is 33.97 kg/m.  Estimated Nutritional Needs:   Kcal:  7425-9563 (11-14 kcal/kg)  Protein:  >/= 151 grams (>/= 2 grams/kg IBW)  Fluid:  1.8 L/day  Helane Rima, MS, RD, LDN Office: [phone] Pager: [phone] After Hours/Weekend Pager: [phone]

## 2018-01-07 NOTE — Progress Notes (Signed)
eLink Physician-Brief Progress Note Patient Name: Tommy PangJames Mathews DOB: 02-Jan-1961 MRN: [id number]   Date of Service  01/07/2018  HPI/Events of Note  ABG on 100%/PRVC 12/TV 500/P 5 = 7.01/69/61/17.4. PRVC rate increased to 20. No evidence of air trapping.   eICU Interventions  Will order: 1. Increase PRVC rate to 20. 2. NaHCO3 100 meq IV now. 3. Repeat ABG at 8 AM.        Lenell AntuSommer,Steven Eugene 01/07/2018, 6:14 AM

## 2018-01-07 NOTE — Progress Notes (Signed)
`   Pharmacy Antibiotic Note  Tommy Mathews is a 57 y.o. male admitted on 01/06/2018 with worsening respiratory failure requiring mechanical ventialtion. Patient with presumed aspiration event and positive MRSA PCR. Pharmacy has been consulted for Unasyn and Vancomycin dosing.  Plan: Unasyn 3g IV Q12hr.   Patient last received vancomycin at 0600. Patient with worsening renal function. Will obtain vancomycin random level with am labs. Will reorder vancomycin based on level.   Height: 5\' 10"  (177.8 cm) Weight: 236 lb 12.4 oz (107.4 kg) IBW/kg (Calculated) : 73  Temp (24hrs), Avg:99.6 F (37.6 C), Min:98.6 F (37 C), Max:101.3 F (38.5 C)  Recent Labs  Lab 01/06/18 1931 01/06/18 2113 01/07/18 0039 01/07/18 0047 01/07/18 0601 01/07/18 0901  WBC 8.3  --   --   --  0.8*  --   CREATININE 3.46* 2.60*  --  3.06*  --   --   LATICACIDVEN 6.7*  --  4.3*  --  4.7* 5.1*    Estimated Creatinine Clearance: 33.5 mL/min (A) (by C-G formula based on SCr of 3.06 mg/dL (H)).    Not on File  Antimicrobials this admission: Cefepime 4/8 x 1 Vancomycin 4/8 >>  Unasyn 4/9 >>   Dose adjustments this admission: 4/9 Vancomycin held pending level   Microbiology results: 4/8 BCx x 1:no growth < 12 hours 4/8 MRSA PCR: positive   Thank you for allowing pharmacy to be a part of this patient's care.  Sara Selvidge L 01/07/2018 9:53 PM

## 2018-01-07 NOTE — Consult Note (Signed)
Date: 01/07/2018                  Patient Name:  Tommy Mathews  MRN: [id number]  DOB: Nov 10, 1960  Age / Sex: 57 y.o., male         PCP: Patient, [name]                 Service Requesting Consult: ICU/ Demetrios Loll, MD                 Reason for Consult: ARF            History of Present Illness: Patient is a 57 y.o. male with medical problems of schizophrenia, traumatic brain injury 2009, diabetes, hypertension, hyperlipidemia, GERD, who was admitted to [facility] on 01/06/2018 for evaluation of worsening respiratory distress.  Patient was brought in by EMS from an assisted living facility Patient required intubation in the emergency room.  He was noted to have dark coffee-ground aspirate in the OG tube.  Patient is on Xarelto therefore there is concern of GI bleed.  CT scan of the  Chest showed multifocal pneumonia.  He was admitted to ICU for further management of sepsis Workup so far has showed severe acidosis with pH improved to 7.21.  Lactate level is elevated.  This morning patient is noted to have low platelets and severe leukopenia.  Urinalysis showed glucosuria, ketones, moderate leukocytes, too numerous to count WBCs, protein, 6 to 30 RBCs.  MRSA screen positive.  Preliminary report of blood cultures is negative so far Creatinine at admission was 3.46 which has improved to 3.06 this morning Historical information is obtained from patient's sister  Medications: Outpatient medications: Medications Prior to Admission  Medication Sig Dispense Refill Last Dose  . atorvastatin (LIPITOR) 80 MG tablet Take 80 mg by mouth at bedtime.   01/05/2018 at 2000  . divalproex (DEPAKOTE ER) 500 MG 24 hr tablet Take 500 mg by mouth 2 (two) times daily.   01/06/2018 at 0800  . estradiol (ESTRACE) 2 MG tablet Take 2 mg by mouth daily.   01/06/2018 at 0800  . FLUoxetine (PROZAC) 40 MG capsule Take 40 mg by mouth daily.   01/06/2018 at 0800  . folic acid (FOLVITE) 867 MCG tablet Take 800 mcg by mouth daily.    01/06/2018 at 0800  . hydrochlorothiazide (HYDRODIURIL) 50 MG tablet Take 50 mg by mouth daily.   01/06/2018 at 0800  . lisinopril (PRINIVIL,ZESTRIL) 5 MG tablet Take 5 mg by mouth daily.   01/06/2018 at 0800  . metFORMIN (GLUCOPHAGE) 1000 MG tablet Take 1,000 mg by mouth 2 (two) times daily with a meal.   01/06/2018 at 0800  . methotrexate (RHEUMATREX) 2.5 MG tablet Take 15 mg by mouth once a week.   Past Week at Unknown time  . metoprolol tartrate (LOPRESSOR) 50 MG tablet Take 50 mg by mouth 2 (two) times daily.   01/06/2018 at 0800  . Omega-3 Fatty Acids (FISH OIL) 1000 MG CAPS Take 1,000 mg by mouth daily.   01/06/2018 at 0800  . QUEtiapine Fumarate (SEROQUEL PO) Take 500 mg by mouth 2 (two) times daily.   01/06/2018 at 0800  . ranitidine (ZANTAC) 150 MG tablet Take 150 mg by mouth daily.   Unknown at Unknown  . rivaroxaban (XARELTO) 10 MG TABS tablet Take 10 mg by mouth daily.   01/06/2018 at 0800  . senna-docusate (SENOKOT-S) 8.6-50 MG tablet Take 2 tablets by mouth at bedtime.   01/05/2018 at  2000  . sodium chloride 1 g tablet Take 1 g by mouth daily.   Unknown at Unknown    Current medications: Current Facility-Administered Medications  Medication Dose Route Frequency Provider Last Rate Last Dose  . 0.9 %  sodium chloride infusion  250 mL Intravenous PRN Lance Coon, MD      . acetaminophen (TYLENOL) suppository 650 mg  650 mg Rectal Q6H PRN Anders Simmonds, MD      . Ampicillin-Sulbactam (UNASYN) 3 g in sodium chloride 0.9 % 100 mL IVPB  3 g Intravenous Q12H Flora Lipps, MD      . chlorhexidine gluconate (MEDLINE KIT) (PERIDEX) 0.12 % solution 15 mL  15 mL Mouth Rinse BID Flora Lipps, MD   15 mL at 01/07/18 0815  . fentaNYL 2553mg in NS 2541m(1074mml) infusion-PREMIX  0-400 mcg/hr Intravenous Continuous WilLance CoonD 10 mL/hr at 01/06/18 2337 100 mcg/hr at 01/06/18 2337  . hydrocortisone sodium succinate (SOLU-CORTEF) 100 MG injection 50 mg  50 mg Intravenous Q6H KasFlora LippsD   50 mg at  01/07/18 0945  . LORazepam (ATIVAN) injection 2-4 mg  2-4 mg Intravenous Q1H PRN KasFlora LippsD      . MEDLINE mouth rinse  15 mL Mouth Rinse 10 times per day KasFlora LippsD   15 mL at 01/07/18 0947  . norepinephrine (LEVOPHED) 16 mg in dextrose 5 % 250 mL (0.064 mg/mL) infusion  0-80 mcg/min Intravenous Titrated WilLance CoonD 28.1 mL/hr at 01/07/18 0353 30 mcg/min at 01/07/18 0353  . ondansetron (ZOFRAN) injection 4 mg  4 mg Intravenous Q6H PRN WilLance CoonD      . pantoprazole (PROTONIX) injection 40 mg  40 mg Intravenous Daily KasFlora LippsD      . phenylephrine (NEO-SYNEPHRINE) 40 mg in sodium chloride 0.9 % 250 mL (0.16 mg/mL) infusion  0-400 mcg/min Intravenous Titrated WilLance CoonD 150 mL/hr at 01/07/18 0836 400 mcg/min at 01/07/18 0836  . sodium bicarbonate 150 mEq in sterile water 1,000 mL infusion   Intravenous Continuous KasFlora LippsD 100 mL/hr at 01/07/18 0810    . vancomycin (VANCOCIN) 1,250 mg in sodium chloride 0.9 % 250 mL IVPB  1,250 mg Intravenous Q24H WilLance CoonD   Stopped at 01/07/18 [phone] vasopressin (PITRESSIN) 40 Units in sodium chloride 0.9 % 250 mL (0.16 Units/mL) infusion  0.03 Units/min Intravenous Continuous SomAnders SimmondsD 11.3 mL/hr at 01/07/18 0320 0.03 Units/min at 01/07/18 0320  . vecuronium (NORCURON) injection 10 mg  10 mg Intravenous Q30 min PRN KasFlora LippsD          Allergies: Not on File    Past Medical History: Past Medical History:  Diagnosis Date  . [place] ([place] . GERD (gastroesophageal reflux disease)   . HLD (hyperlipidemia)   . HTN (hypertension)      Past Surgical History: See below   Family History: No family history on file.   Social History: Social History   Socioeconomic History  . Marital status: Unknown    Spouse name: Not on file  . Number of children: Not on file  . Years of education: Not on file  . Highest education level: Not on file  Occupational History  . Not on file   Social Needs  . Financial resource strain: Not on file  . Food insecurity:    Worry: Not on file    Inability: Not on file  . Transportation needs:    Medical:  Not on file    Non-medical: Not on file  Tobacco Use  . Smoking status: Unknown If Ever Smoked  Substance and Sexual Activity  . Alcohol use: Not on file  . Drug use: Not on file  . Sexual activity: Not on file  Lifestyle  . Physical activity:    Days per week: Not on file    Minutes per session: Not on file  . Stress: Not on file  Relationships  . Social connections:    Talks on phone: Not on file    Gets together: Not on file    Attends religious service: Not on file    Active member of club or organization: Not on file    Attends meetings of clubs or organizations: Not on file    Relationship status: Not on file  . Intimate partner violence:    Fear of current or ex partner: Not on file    Emotionally abused: Not on file    Physically abused: Not on file    Forced sexual activity: Not on file  Other Topics Concern  . Not on file  Social History Narrative  . Not on file     Review of Systems: Not available.  Patient is intubated and sedated Gen:  HEENT:  CV:  Resp:  GI: GU :  MS:  Derm:   Psych: Heme:  Neuro:  Endocrine  Vital Signs: Blood pressure 95/74, pulse (!) 125, temperature 98.6 F (37 C), resp. rate 18, height _0  (1.778 m), weight 236 lb 12.4 oz (107.4 kg), SpO2 (!) 75 %.   Intake/Output Summary (Last 24 hours) at 01/07/2018 1025 Last data filed at 01/07/2018 0409 Gross per 24 hour  Intake 1750.9 ml  Output 100 ml  Net 1650.9 ml    Weight trends: [employer]   01/06/18 2337 01/07/18 0142  Weight: 236 lb 12.4 oz (107.4 kg) 236 lb 12.4 oz (107.4 kg)    Physical Exam: General:  Critically ill-appearing, laying in the bed  HEENT  OG tube, ET tube,  Neck:  No JVD, no masses  Lungs:  Ventilator assisted, FiO2 100%  Heart::  Tachycardic, regular  Abdomen:  Distended,  decreased bowel sounds  Extremities:  No peripheral edema, cool clammy extremities  Neurologic:  Sedated  Skin:  No acute rashes  Access:  Left femoral triple-lumen  Foley:  Present       Lab results: Basic Metabolic Panel: Recent Labs  Lab 01/06/18 1931 01/07/18 0047  NA 136 137  K 3.9 4.0  CL 99* 106  CO2 19* 21*  GLUCOSE 149* 118*  BUN 57* 58*  CREATININE 3.46* 3.06*  CALCIUM 8.6* 6.9*    Liver Function Tests: Recent Labs  Lab 01/06/18 1931  AST 32  ALT 14*  ALKPHOS 49  BILITOT 0.6  PROT 6.6  ALBUMIN 3.2*   Recent Labs  Lab 01/06/18 1931  LIPASE 17   No results for input(s): AMMONIA in the last 168 hours.  CBC: Recent Labs  Lab 01/06/18 1931 01/07/18 0601  WBC 8.3 0.8*  NEUTROABS 6.0  --   HGB 14.5 13.3  HCT 45.5 43.0  MCV 85.5 88.1  PLT 167 127*    Cardiac Enzymes: Recent Labs  Lab 01/07/18 0653  TROPONINI 0.03*    BNP: Invalid input(s): POCBNP  CBG: Recent Labs  Lab 01/06/18 2325  GLUCAP 105*    Microbiology: Recent Results (from the past 720 hour(s))  Blood Culture (routine x 2)  Status: None (Preliminary result)   Collection Time: 01/06/18  7:44 PM  Result Value Ref Range Status   Specimen Description BLOOD BLOOD LEFT WRIST  Final   Special Requests   Final    BOTTLES DRAWN AEROBIC AND ANAEROBIC Blood Culture adequate volume   Culture   Final    NO GROWTH < 12 HOURS Performed at [facility], [address] [place], [place] [address]    Report Status PENDING  Incomplete  MRSA PCR Screening     Status: Abnormal   Collection Time: 01/06/18 11:51 PM  Result Value Ref Range Status   MRSA by PCR POSITIVE (A) NEGATIVE Final    Comment:        The GeneXpert MRSA Assay (FDA approved for NASAL specimens only), is one component of a comprehensive MRSA colonization surveillance program. It is not intended to diagnose MRSA infection nor to guide or monitor treatment for MRSA infections. RESULT CALLED TO, READ  BACK BY AND VERIFIED WITH: BETH BUONO 01/07/18 @ 0121  [place]   Performed at [facility], [address] [place], [place] [address]      Coagulation Studies: Recent Labs    01/06/18 1931  LABPROT 16.7*  INR 1.36    Urinalysis: Recent Labs    01/06/18 1944  COLORURINE AMBER*  LABSPEC 1.025  PHURINE 5.0  GLUCOSEU 50*  HGBUR NEGATIVE  BILIRUBINUR NEGATIVE  KETONESUR 5*  PROTEINUR 100*  NITRITE NEGATIVE  LEUKOCYTESUR MODERATE*        Imaging: Ct Abdomen Pelvis Wo Contrast  Result Date: 01/06/2018 CLINICAL DATA:  Respiratory arrest.  Suspect pulmonary embolism. EXAM: CT CHEST, ABDOMEN AND PELVIS WITHOUT CONTRAST TECHNIQUE: Multidetector CT imaging of the chest, abdomen and pelvis was performed following the standard protocol without IV contrast. COMPARISON:  Chest radiograph January 06, 2018 FINDINGS: CT CHEST FINDINGS CARDIOVASCULAR: Heart and pericardium are unremarkable. Thoracic aorta is normal course and caliber, mild calcific atherosclerosis. MEDIASTINUM/NODES: No mediastinal mass. No lymphadenopathy by CT size criteria limited assessment without contrast. Normal appearance of thoracic esophagus though not tailored for evaluation. LUNGS/PLEURA: Tracheobronchial tree is patent, no pneumothorax. Endotracheal tube tip above the carina. Bilateral tree-in-bud infiltrates and dense consolidation with air bronchograms. RIGHT upper lobe collapse with soft tissue effacing RIGHT upper lobe bronchus. MUSCULOSKELETAL: Included soft tissues and included osseous structures appear normal. CT ABDOMEN PELVIS FINDINGS HEPATOBILIARY: Subcentimeter layering gallstones without CT findings of acute cholecystitis by noncontrast CT. Trace perihepatic focal fat. PANCREAS: Normal. SPLEEN: Normal. ADRENALS/URINARY TRACT: Kidneys are orthotopic, demonstrating normal size and morphology. No nephrolithiasis, hydronephrosis; limited assessment for renal masses on this nonenhanced examination. The  unopacified ureters are normal in course and caliber. Urinary bladder decompressed by Foley catheter. Normal adrenal glands. STOMACH/BOWEL: Nasogastric tube terminates in distal stomach. Gas distended small bowel measuring to 3.8 cm with multiple transition points in air-fluid levels. Large bowel normal in course and caliber. Normal appendix. VASCULAR/LYMPHATIC: Mildly ectatic 2.4 cm infrarenal aorta. Mild calcific atherosclerosis. No lymphadenopathy by CT size criteria. REPRODUCTIVE: Normal. OTHER: No intraperitoneal free fluid or free air. MUSCULOSKELETAL: Non-acute. Small fat containing LEFT inguinal hernia. Mild L5-S1 degenerative disc. IMPRESSION: CT CHEST: 1. Pulmonary embolism cannot be excluded by noncontrast CT. 2. Multifocal severe pneumonia with RIGHT upper lobe collapse and soft tissue obstructing RIGHT upper lobe bronchus. Consider bronchoscopy. 3. Endotracheal tube tip above the carina. CT ABDOMEN AND PELVIS: 1. Mild small bowel ileus, potential enteritis. Nasogastric tube terminates in distal stomach. 2. Cholelithiasis without CT findings of acute cholecystitis. Aortic  Atherosclerosis (ICD10-I70.0). Electronically Signed   By: Elon Alas M.D.   On: 01/06/2018 21:44   Ct Chest Wo Contrast  Result Date: 01/06/2018 CLINICAL DATA:  Respiratory arrest.  Suspect pulmonary embolism. EXAM: CT CHEST, ABDOMEN AND PELVIS WITHOUT CONTRAST TECHNIQUE: Multidetector CT imaging of the chest, abdomen and pelvis was performed following the standard protocol without IV contrast. COMPARISON:  Chest radiograph January 06, 2018 FINDINGS: CT CHEST FINDINGS CARDIOVASCULAR: Heart and pericardium are unremarkable. Thoracic aorta is normal course and caliber, mild calcific atherosclerosis. MEDIASTINUM/NODES: No mediastinal mass. No lymphadenopathy by CT size criteria limited assessment without contrast. Normal appearance of thoracic esophagus though not tailored for evaluation. LUNGS/PLEURA: Tracheobronchial tree is  patent, no pneumothorax. Endotracheal tube tip above the carina. Bilateral tree-in-bud infiltrates and dense consolidation with air bronchograms. RIGHT upper lobe collapse with soft tissue effacing RIGHT upper lobe bronchus. MUSCULOSKELETAL: Included soft tissues and included osseous structures appear normal. CT ABDOMEN PELVIS FINDINGS HEPATOBILIARY: Subcentimeter layering gallstones without CT findings of acute cholecystitis by noncontrast CT. Trace perihepatic focal fat. PANCREAS: Normal. SPLEEN: Normal. ADRENALS/URINARY TRACT: Kidneys are orthotopic, demonstrating normal size and morphology. No nephrolithiasis, hydronephrosis; limited assessment for renal masses on this nonenhanced examination. The unopacified ureters are normal in course and caliber. Urinary bladder decompressed by Foley catheter. Normal adrenal glands. STOMACH/BOWEL: Nasogastric tube terminates in distal stomach. Gas distended small bowel measuring to 3.8 cm with multiple transition points in air-fluid levels. Large bowel normal in course and caliber. Normal appendix. VASCULAR/LYMPHATIC: Mildly ectatic 2.4 cm infrarenal aorta. Mild calcific atherosclerosis. No lymphadenopathy by CT size criteria. REPRODUCTIVE: Normal. OTHER: No intraperitoneal free fluid or free air. MUSCULOSKELETAL: Non-acute. Small fat containing LEFT inguinal hernia. Mild L5-S1 degenerative disc. IMPRESSION: CT CHEST: 1. Pulmonary embolism cannot be excluded by noncontrast CT. 2. Multifocal severe pneumonia with RIGHT upper lobe collapse and soft tissue obstructing RIGHT upper lobe bronchus. Consider bronchoscopy. 3. Endotracheal tube tip above the carina. CT ABDOMEN AND PELVIS: 1. Mild small bowel ileus, potential enteritis. Nasogastric tube terminates in distal stomach. 2. Cholelithiasis without CT findings of acute cholecystitis. Aortic Atherosclerosis (ICD10-I70.0). Electronically Signed   By: Elon Alas M.D.   On: 01/06/2018 21:44   Dg Chest Port 1  View  Result Date: 01/07/2018 CLINICAL DATA:  Acute respiratory failure.  Hypoxia. EXAM: PORTABLE CHEST 1 VIEW COMPARISON:  CT 6 hours prior, radiographs 8 hours prior. FINDINGS: Endotracheal tube 3.9 cm from the carina. Enteric tube in place with tip below the diaphragm, not included in the field of view. Progressive bilateral lung opacities, greatest in the lung bases with air bronchograms. Heart appears normal in size. No large pleural effusion. No pneumothorax. IMPRESSION: Progressive bilateral lung opacities, greatest in the lung bases, may reflect pneumonia or aspiration. Pulmonary edema is felt less likely. Electronically Signed   By: Jeb Levering M.D.   On: 01/07/2018 04:01   Dg Chest Portable 1 View  Result Date: 01/06/2018 CLINICAL DATA:  ETT placement EXAM: PORTABLE CHEST 1 VIEW COMPARISON:  None. FINDINGS: Endotracheal tube tip is about 2.4 cm superior to the carina. Low lung volumes. No pleural effusion or pneumothorax. Heart size within normal limits. Streaky left greater than right hilar opacity. IMPRESSION: 1. Endotracheal tube tip about 2.4 cm superior to the carina 2. Low lung volumes. Streaky left greater than right perihilar opacity of uncertain chronicity. Electronically Signed   By: Donavan Foil M.D.   On: 01/06/2018 19:35      Assessment & Plan: Pt is a 57 y.o. African-American  male with schizophrenia, traumatic brain injury 2009 requiring prolonged hospital stay for over 2 months, ventilator support, tracheostomy, diabetes, hypertension, atherosclerosis, was admitted on 01/06/2018 with severe sepsis and multifocal pneumonia.  Patient is a current smoker and is resident of assisted living facility  1.  Acute renal failure, likely severe ATN from concurrent illness and sepsis Baseline creatinine is unknown  Patient is oligoanuric. His urine output may be increasing.  Clear yellow urine noted in the Foley catheter.  Electrolytes and volume status are acceptable.  No acute  indication for dialysis at present.  We did discuss with the family that patient's condition is critical and if it gets any worse he may end up needing CRRT / renal replacement therapy  2.  Acute respiratory failure Likely secondary to aspiration pneumonia Consider checking HIV  3. Sepsis Requiring multiple pressors Management as per ICU team      LOS: [place] 4/9/201910:25 [place], [place]  Note: This note was prepared with Dragon dictation. Any transcription errors are unintentional

## 2018-01-08 ENCOUNTER — Inpatient Hospital Stay: Payer: Medicaid Other

## 2018-01-08 DIAGNOSIS — R579 Shock, unspecified: Secondary | ICD-10-CM

## 2018-01-08 DIAGNOSIS — K920 Hematemesis: Secondary | ICD-10-CM

## 2018-01-08 LAB — LEGIONELLA PNEUMOPHILA SEROGP 1 UR AG: L. PNEUMOPHILA SEROGP 1 UR AG: NEGATIVE

## 2018-01-08 LAB — BASIC METABOLIC PANEL
ANION GAP: 10 (ref 5–15)
BUN: 50 mg/dL — ABNORMAL HIGH (ref 6–20)
CHLORIDE: 103 mmol/L (ref 101–111)
CO2: 26 mmol/L (ref 22–32)
Calcium: 6 mg/dL — CL (ref 8.9–10.3)
Creatinine, Ser: 2.21 mg/dL — ABNORMAL HIGH (ref 0.61–1.24)
GFR calc non Af Amer: 32 mL/min — ABNORMAL LOW (ref >60)
GFR, EST AFRICAN AMERICAN: 37 mL/min — AB (ref >60)
Glucose, Bld: 161 mg/dL — ABNORMAL HIGH (ref 65–99)
Potassium: 3.5 mmol/L (ref 3.5–5.1)
Sodium: 139 mmol/L (ref 135–145)

## 2018-01-08 LAB — GLUCOSE, CAPILLARY
GLUCOSE-CAPILLARY: 156 mg/dL — AB (ref 65–99)
GLUCOSE-CAPILLARY: 145 mg/dL — AB (ref 65–99)

## 2018-01-08 LAB — URINE CULTURE: Culture: 10000 — AB

## 2018-01-08 LAB — CBC
HCT: 39.3 % — ABNORMAL LOW (ref 40.0–52.0)
Hemoglobin: 12.4 g/dL — ABNORMAL LOW (ref 13.0–18.0)
MCH: 27.2 pg (ref 26.0–34.0)
MCHC: 31.7 g/dL — ABNORMAL LOW (ref 32.0–36.0)
MCV: 85.7 fL (ref 80.0–100.0)
Platelets: 122 10*3/uL — ABNORMAL LOW (ref 150–440)
RBC: 4.58 MIL/uL (ref 4.40–5.90)
RDW: 17.8 % — ABNORMAL HIGH (ref 11.5–14.5)
WBC: 1.5 10*3/uL — ABNORMAL LOW (ref 3.8–10.6)

## 2018-01-08 LAB — HIV ANTIBODY (ROUTINE TESTING W REFLEX): HIV Screen 4th Generation wRfx: NONREACTIVE

## 2018-01-08 LAB — VANCOMYCIN, RANDOM: Vancomycin Rm: 11

## 2018-01-08 MED ORDER — SODIUM CHLORIDE 0.9 % IV SOLN
0.0000 mg/h | INTRAVENOUS | Status: DC
  Administered 2018-01-08 – 2018-01-09 (×2): 2 mg/h via INTRAVENOUS
  Administered 2018-01-11: 4 mg/h via INTRAVENOUS
  Filled 2018-01-08 (×3): qty 10

## 2018-01-08 MED ORDER — VANCOMYCIN HCL IN DEXTROSE 1-5 GM/200ML-% IV SOLN
1000.0000 mg | Freq: Two times a day (BID) | INTRAVENOUS | Status: DC
  Administered 2018-01-08 – 2018-01-09 (×3): 1000 mg via INTRAVENOUS
  Filled 2018-01-08 (×5): qty 200

## 2018-01-08 MED ORDER — VECURONIUM BROMIDE 10 MG IV SOLR
10.0000 mg | Freq: Once | INTRAVENOUS | Status: AC
  Administered 2018-01-08: 10 mg via INTRAVENOUS

## 2018-01-08 MED ORDER — SODIUM CHLORIDE 0.9 % IV SOLN
2.0000 g | Freq: Two times a day (BID) | INTRAVENOUS | Status: DC
  Administered 2018-01-08 – 2018-01-10 (×4): 2 g via INTRAVENOUS
  Filled 2018-01-08 (×6): qty 2

## 2018-01-08 MED ORDER — AMPICILLIN-SULBACTAM SODIUM 3 (2-1) G IJ SOLR
3.0000 g | Freq: Four times a day (QID) | INTRAMUSCULAR | Status: DC
  Filled 2018-01-08 (×2): qty 3

## 2018-01-08 MED ORDER — SODIUM CHLORIDE 0.9 % IV SOLN
3.0000 ug/kg/min | INTRAVENOUS | Status: DC
  Administered 2018-01-08: 3 ug/kg/min via INTRAVENOUS
  Administered 2018-01-09: 2.5 ug/kg/min via INTRAVENOUS
  Filled 2018-01-08: qty 20

## 2018-01-08 MED ORDER — FENTANYL BOLUS VIA INFUSION
50.0000 ug | INTRAVENOUS | Status: DC | PRN
  Filled 2018-01-08: qty 50

## 2018-01-08 MED ORDER — FENTANYL 2500MCG IN NS 250ML (10MCG/ML) PREMIX INFUSION: 100.0000 ug/h | INTRAVENOUS | Status: DC

## 2018-01-08 MED ORDER — MIDAZOLAM HCL 2 MG/2ML IJ SOLN
2.0000 mg | Freq: Once | INTRAMUSCULAR | Status: DC | PRN
  Filled 2018-01-08: qty 2

## 2018-01-08 MED ORDER — MIDAZOLAM BOLUS VIA INFUSION
2.0000 mg | INTRAVENOUS | Status: DC | PRN
  Administered 2018-01-10 – 2018-01-11 (×3): 2 mg via INTRAVENOUS
  Filled 2018-01-08 (×2): qty 2

## 2018-01-08 MED ORDER — MUPIROCIN 2 % EX OINT
1.0000 "application " | TOPICAL_OINTMENT | Freq: Two times a day (BID) | CUTANEOUS | Status: AC
  Administered 2018-01-08 – 2018-01-12 (×10): 1 via NASAL
  Filled 2018-01-08: qty 22

## 2018-01-08 MED ORDER — IBUPROFEN 100 MG/5ML PO SUSP
600.0000 mg | Freq: Once | ORAL | Status: AC
  Administered 2018-01-08: 600 mg via ORAL
  Filled 2018-01-08: qty 30

## 2018-01-08 MED ORDER — IBUPROFEN 100 MG PO CHEW
600.0000 mg | CHEWABLE_TABLET | Freq: Once | ORAL | Status: DC
  Filled 2018-01-08: qty 6

## 2018-01-08 MED ORDER — CHLORHEXIDINE GLUCONATE CLOTH 2 % EX PADS
6.0000 | MEDICATED_PAD | Freq: Every day | CUTANEOUS | Status: AC
  Administered 2018-01-09 – 2018-01-13 (×5): 6 via TOPICAL

## 2018-01-08 MED ORDER — METRONIDAZOLE IN NACL 5-0.79 MG/ML-% IV SOLN
500.0000 mg | Freq: Three times a day (TID) | INTRAVENOUS | Status: DC
  Administered 2018-01-08 – 2018-01-13 (×16): 500 mg via INTRAVENOUS
  Filled 2018-01-08 (×19): qty 100

## 2018-01-08 MED ORDER — CISATRACURIUM BOLUS VIA INFUSION
0.1000 mg/kg | Freq: Once | INTRAVENOUS | Status: AC
  Administered 2018-01-08: 11.7 mg via INTRAVENOUS
  Filled 2018-01-08: qty 12

## 2018-01-08 MED ORDER — STERILE WATER FOR INJECTION IJ SOLN
INTRAMUSCULAR | Status: AC
  Administered 2018-01-08: 10 mL
  Filled 2018-01-08: qty 10

## 2018-01-08 MED ORDER — MIDAZOLAM HCL 2 MG/2ML IJ SOLN
2.0000 mg | Freq: Once | INTRAMUSCULAR | Status: AC
  Administered 2018-01-08: 2 mg via INTRAVENOUS

## 2018-01-08 MED ORDER — INSULIN ASPART 100 UNIT/ML ~~LOC~~ SOLN
0.0000 [IU] | SUBCUTANEOUS | Status: DC
  Administered 2018-01-08: 2 [IU] via SUBCUTANEOUS
  Administered 2018-01-08: 3 [IU] via SUBCUTANEOUS
  Administered 2018-01-09: 2 [IU] via SUBCUTANEOUS
  Administered 2018-01-09 (×2): 3 [IU] via SUBCUTANEOUS
  Administered 2018-01-09: 2 [IU] via SUBCUTANEOUS
  Administered 2018-01-09: 3 [IU] via SUBCUTANEOUS
  Administered 2018-01-09: 2 [IU] via SUBCUTANEOUS
  Administered 2018-01-10: 5 [IU] via SUBCUTANEOUS
  Administered 2018-01-10 (×3): 3 [IU] via SUBCUTANEOUS
  Administered 2018-01-10: 2 [IU] via SUBCUTANEOUS
  Administered 2018-01-10: 3 [IU] via SUBCUTANEOUS
  Administered 2018-01-11: 5 [IU] via SUBCUTANEOUS
  Administered 2018-01-11: 3 [IU] via SUBCUTANEOUS
  Administered 2018-01-11 (×2): 5 [IU] via SUBCUTANEOUS
  Administered 2018-01-11 (×2): 3 [IU] via SUBCUTANEOUS
  Administered 2018-01-12: 5 [IU] via SUBCUTANEOUS
  Administered 2018-01-12: 3 [IU] via SUBCUTANEOUS
  Administered 2018-01-12: 5 [IU] via SUBCUTANEOUS
  Administered 2018-01-12 – 2018-01-13 (×9): 3 [IU] via SUBCUTANEOUS
  Administered 2018-01-14 (×2): 5 [IU] via SUBCUTANEOUS
  Administered 2018-01-14: 2 [IU] via SUBCUTANEOUS
  Administered 2018-01-14: 5 [IU] via SUBCUTANEOUS
  Administered 2018-01-14: 3 [IU] via SUBCUTANEOUS
  Administered 2018-01-14: 5 [IU] via SUBCUTANEOUS
  Administered 2018-01-15 (×3): 3 [IU] via SUBCUTANEOUS
  Administered 2018-01-15 (×3): 5 [IU] via SUBCUTANEOUS
  Administered 2018-01-16 (×4): 3 [IU] via SUBCUTANEOUS
  Administered 2018-01-17: 2 [IU] via SUBCUTANEOUS
  Administered 2018-01-17: 3 [IU] via SUBCUTANEOUS
  Administered 2018-01-17 (×2): 2 [IU] via SUBCUTANEOUS
  Administered 2018-01-18: 5 [IU] via SUBCUTANEOUS
  Administered 2018-01-18 (×2): 2 [IU] via SUBCUTANEOUS
  Administered 2018-01-18 (×2): 3 [IU] via SUBCUTANEOUS
  Administered 2018-01-18: 2 [IU] via SUBCUTANEOUS
  Administered 2018-01-19: 8 [IU] via SUBCUTANEOUS
  Administered 2018-01-19: 5 [IU] via SUBCUTANEOUS
  Administered 2018-01-19: 11 [IU] via SUBCUTANEOUS
  Administered 2018-01-19: 5 [IU] via SUBCUTANEOUS
  Administered 2018-01-19: 8 [IU] via SUBCUTANEOUS
  Administered 2018-01-20: 11 [IU] via SUBCUTANEOUS
  Administered 2018-01-20 (×2): 8 [IU] via SUBCUTANEOUS
  Filled 2018-01-08 (×64): qty 1

## 2018-01-08 MED ORDER — SODIUM CHLORIDE 0.9 % IV SOLN
2.0000 g | Freq: Once | INTRAVENOUS | Status: AC
  Administered 2018-01-08: 2 g via INTRAVENOUS
  Filled 2018-01-08: qty 20

## 2018-01-08 MED ORDER — ARTIFICIAL TEARS OPHTHALMIC OINT
1.0000 "application " | TOPICAL_OINTMENT | Freq: Three times a day (TID) | OPHTHALMIC | Status: DC
  Administered 2018-01-08 – 2018-01-20 (×36): 1 via OPHTHALMIC
  Filled 2018-01-08 (×2): qty 3.5

## 2018-01-08 NOTE — Progress Notes (Signed)
[facility] [place] [facility]   PATIENT NAME: Tommy Mathews    MR#:  [id number]  DATE OF BIRTH:  08-07-1961  SUBJECTIVE:  CHIEF COMPLAINT:   Chief Complaint  Patient presents with  . Respiratory Distress   The patient is on ventilation and 3 maximum doses of pressor drips still hypotensive REVIEW OF SYSTEMS:  Review of Systems  Unable to perform ROS: Intubated    DRUG ALLERGIES:  Not on File VITALS:  Blood pressure 113/85, pulse (!) 116, temperature 98.1 F (36.7 C), temperature source Core, resp. rate (!) 30, height 5\' 10"  (1.778 m), weight 116.5 kg (256 lb 13.4 oz), SpO2 94 %. PHYSICAL EXAMINATION:  Physical Exam  HENT:  Head: Normocephalic.  Eyes: No scleral icterus.  Bilateral pupils are pinpoint, not reactive to light.  Neck: No JVD present. No tracheal deviation present.  Cardiovascular: Regular rhythm and normal heart sounds. Exam reveals no gallop.  No murmur heard. Pulmonary/Chest:  Bilateral crackles  Abdominal: Soft. He exhibits no distension.  Hypoactive bowel sounds  Musculoskeletal: He exhibits no edema or tenderness.  Neurological:  Unable to exam.  Skin: No rash noted. No erythema.   LABORATORY PANEL:  Male CBC Recent Labs  Lab 01/08/18 0424  WBC 1.5*  HGB 12.4*  HCT 39.3*  PLT 122*   ------------------------------------------------------------------------------------------------------------------ Chemistries  Recent Labs  Lab 01/06/18 1931  01/08/18 0424  NA 136   < > 139  K 3.9   < > 3.5  CL 99*   < > 103  CO2 19*   < > 26  GLUCOSE 149*   < > 161*  BUN 57*   < > 50*  CREATININE 3.46*   < > 2.21*  CALCIUM 8.6*   < > 6.0*  AST 32  --   --   ALT 14*  --   --   ALKPHOS 49  --   --   BILITOT 0.6  --   --    < > = values in this interval not displayed.   RADIOLOGY:  Dg Chest Port 1 View  Result Date: 01/08/2018 CLINICAL DATA:  Respiratory failure EXAM: PORTABLE CHEST 1 VIEW COMPARISON:  Portable exam  1003 hours compared to 01/07/2018 FINDINGS: Tip of endotracheal tube projects 4.4 cm above carina. Nasogastric tube extends into abdomen. Enlargement of cardiac silhouette. Diffuse BILATERAL airspace infiltrates increased in upper lobes since previous exam. No gross pleural effusion or pneumothorax. IMPRESSION: Increased BILATERAL airspace infiltrates. Electronically Signed   By: Ulyses SouthwardMark  Boles M.D.   On: 01/08/2018 10:33   ASSESSMENT AND PLAN:   Severe sepsis with septic shock,  due to his pneumonia and UTI Continue 3 pressors epinephrine, vasopressin and Levophed drip, maximum dose is now, continue broad-spectrum antibiotics cefepime, vanc, follow-up cultures.   Lactic acidosis.  Lactic acid very elevated, aggressive IV fluid resuscitation  Neutropenia.  Possible due to above.  Follow-up CBC.  GI bleed hold Xarelto, PPI drip,  PRBC transfusion as needed  patient was on methotrexate which is on hold GI is following Hemoglobin stable at 12.4 today    Acute respiratory failure with hypoxia (HCC) -intubated and mechanically ventilated, likely due to his pneumonia and a possible aspiration event   Multifocal pneumonia -IV antibiotics as above, intubated as above, supportive treatment PRN    AKI (acute kidney injury), from severe ATN and sepsis  continue aggressive IV fluids as above, follow-up BMP.  Creatinine 2.1 today    Ileus (HCC) -patient has OG  tube in place to suction    Diabetes (HCC) -sliding scale insulin with corresponding glucose checks  All the records are reviewed and case discussed with Care Management/Social Worker. Management plans discussed with the patient's sister and they are in agreement.  CODE STATUS: Full Code  TOTAL TIME TAKING CARE OF THIS PATIENT: 38 minutes.   More than 50% of the time was spent in counseling/coordination of care: YES  POSSIBLE D/C IN ? DAYS, DEPENDING ON CLINICAL CONDITION.   Ramonita Lab M.D on 01/08/2018 at 4:17 PM  Between 7am to 6pm -  Pager - [phone]  After 6pm go to [email or web address] - Therapist, nutritional Hospitalists

## 2018-01-08 NOTE — Progress Notes (Signed)
Decreased FIO2 to 70%

## 2018-01-08 NOTE — Progress Notes (Signed)
eLink Physician-Brief Progress Note Patient Name: Tommy PangJames Ruble DOB: Dec 04, 1960 MRN: [id number]   Date of Service  01/08/2018  HPI/Events of Note  Multiple issues: 1. Ca++ = 6.0 and 2. Fever to 101.7 which has gone up post Tylenol. Patient is already on Unasyn.  eICU Interventions  Will order: 1. Replace Ca++. 2. Cooling blanket.      Intervention Category Major Interventions: Electrolyte abnormality - evaluation and management;Other:  Sommer,Steven Dennard Nipugene 01/08/2018, 5:40 AM

## 2018-01-08 NOTE — Progress Notes (Signed)
PULMONARY / CRITICAL CARE MEDICINE   Name: Tommy Mathews MRN: [id number] DOB: [birth year]    ADMISSION DATE:  01/06/2018   PT PROFILE:  75 M SNF resident due to psychiatric illness and history of TBI admitted with severe bilateral pneumonia, severe sepsis/septic shock.  Intubated in the ED shortly after arrival.  Was also noted to have dark coffee-ground material suctioned from stomach.  Patient chronically on rivaroxaban.   MAJOR EVENTS/TEST RESULTS: 04/08 Admission as above 04/08 CT chest: Multifocal severe pneumonia with RUL collapse and soft tissue obstructing RUL bronchus. 04/08 CTAP: Mild small bowel ileus, potential enteritis. Nasogastric tube terminates in distal stomach. Cholelithiasis without CT findings of acute cholecystitis.Consider bronchoscopy.  04/09 nephrology consultation: No acute indication for dialysis at present. 04/09 gastroenterology consultation: Recommend high-dose PPI therapy. 04/10 severely hypoxemic despite 100% FiO2.  Recruitment maneuver with significant improvement.  PEEP increased and ARDS strategy on ventilator implemented.  Neuromuscular blockade implemented due to ventilator dyssynchrony.  INDWELLING DEVICES:: ETT 04/08 >>  Femoral CVL 04/08 >>   MICRO DATA: MRSA PCR 04/08 >> POS Urine 04/08 >> 10k coag negative staph Resp 04/09 >>  Blood 04/08 >>  Blood 04/09 >>   ANTIMICROBIALS:  Unasyn 04/08 >> 04/10 Vanc 04/08 >>  Cefepime 04/10 >>  Metronidazole 04/10 >>     SUBJECTIVE:  RASS -4, not F/C.  Upon initial evaluation, he was extremely hypoxemic with SPO2 less than 80%.  We performed recruitment maneuvers on him with improvement in oxygen saturations.  NMB protocol initiated due to ventilator dyssynchrony  VITAL SIGNS: BP 107/82 (BP Location: Right Arm)   Pulse (!) 116   Temp 98.1 F (36.7 C) (Core)   Resp (!) 30   Ht 5\' 10"  (1.778 m)   Wt 256 lb 13.4 oz (116.5 kg)   SpO2 94%   BMI 36.85 kg/m   HEMODYNAMICS: CVP:  [17 mmHg-50 mmHg]  17 mmHg  VENTILATOR SETTINGS: Vent Mode: PRVC FiO2 (%):  [70 %-100 %] 70 % Set Rate:  [20 bmp-30 bmp] 30 bmp Vt Set:  [400 mL] 400 mL PEEP:  [5 cmH20-14 cmH20] 14 cmH20 Plateau Pressure:  [31 cmH20] 31 cmH20  INTAKE / OUTPUT: I/O last 3 completed shifts: In: 9960.6 [I.V.:9760.6; IV Piggyback:200] Out: 3060 [Urine:3010; Emesis/NG output:50]  PHYSICAL EXAMINATION: General: RASS -4, not F/C, dyssynchronous Neuro: PE RRL, EOMI, DTRs symmetric, no spontaneous movement HEENT: NCAT, sclerae white Cardiovascular: Tachy, regular, no M Lungs: Bilateral rhonchi, no wheezes Abdomen: Mildly distended, diminished BS Extremities: Warm, symmetric pedal edema Skin: No lesions noted  LABS:  BMET Recent Labs  Lab 01/06/18 1931 01/06/18 2113 01/07/18 0047 01/08/18 0424  NA 136  --  137 139  K 3.9  --  4.0 3.5  CL 99*  --  106 103  CO2 19*  --  21* 26  BUN 57*  --  58* 50*  CREATININE 3.46* 2.60* 3.06* 2.21*  GLUCOSE 149*  --  118* 161*    Electrolytes Recent Labs  Lab 01/06/18 1931 01/07/18 0047 01/08/18 0424  CALCIUM 8.6* 6.9* 6.0*    CBC Recent Labs  Lab 01/06/18 1931 01/07/18 0601 01/08/18 0424  WBC 8.3 0.8* 1.5*  HGB 14.5 13.3 12.4*  HCT 45.5 43.0 39.3*  PLT 167 127* 122*    Coag's Recent Labs  Lab 01/06/18 1931  INR 1.36    Sepsis Markers Recent Labs  Lab 01/06/18 1931 01/07/18 0039 01/07/18 0601 01/07/18 0901  LATICACIDVEN 6.7* 4.3* 4.7* 5.1*  PROCALCITON 0.44  --   --   --  ABG Recent Labs  Lab 01/07/18 0137 01/07/18 0517 01/07/18 0806  PHART 7.18* 7.01* 7.21*  PCO2ART 47 69* 114*  PO2ART 39* 61* PENDING    Liver Enzymes Recent Labs  Lab 01/06/18 1931  AST 32  ALT 14*  ALKPHOS 49  BILITOT 0.6  ALBUMIN 3.2*    Cardiac Enzymes Recent Labs  Lab 01/07/18 0039 01/07/18 0653 01/07/18 1239  TROPONINI 0.03* 0.03* 0.03*    Glucose Recent Labs  Lab 01/06/18 2325  GLUCAP 105*    CXR: ARDS pattern     ASSESSMENT /  PLAN:  PULMONARY A: Acute hypoxemic respiratory failure HCAP, NOS -possible aspiration ARDS P:   Cont full vent support -changed to lung protection strategy with decreased VT Cont vent bundle Daily SBT if/when meets criteria   CARDIOVASCULAR A:  Septic shock Severe sinus tachycardia P:  MAP goal > 65 mmHg Continue norepinephrine, phenylephrine, vasopressin Wean norepinephrine first due to tachycardia Continue empiric hydrocortisone  RENAL A:   AKI, now nonoliguric P:   Monitor BMET intermittently Monitor I/Os Correct electrolytes as indicated   GASTROINTESTINAL A:   Abdominal distention, likely mild ileus due to critical illness P:   SUP: IV PPI Holding TF for now  HEMATOLOGIC A:   Mild thrombocytopenia Leukopenia, mildly improved 04/10 P:  DVT px: SQ heparin Monitor CBC intermittently Transfuse per usual guidelines   INFECTIOUS A:   Severe sepsis Very high fevers P:   Monitor temp, WBC count Micro and abx as above  Continue cooling blanket  ENDOCRINE A:   Steroid and stress induced hyperglycemia P:   Initiate SSI, moderate scale  NEUROLOGIC A:   Ventilator dyssynchrony ICU/ventilator associated discomfort History of schizophrenia History of TBI P:   RASS goal: his RASS was -4 prior to initiating NMBs BIS goal: 40-60 NMB protocol initiated 04/10 wit cisatracurium Sedation with fentanyl and midaz gtt   FAMILY  - Updates: Only updated in detail at bedside  CCM time:75 mins The above time includes time spent in consultation with patient and/or family members and reviewing care plan on multidisciplinary rounds  Tommy Fischeravid Faelynn Wynder, MD PCCM service Mobile [phone] Pager [phone]    01/08/2018, 4:03 PM

## 2018-01-08 NOTE — Progress Notes (Signed)
[facility], [place] 01/08/18  Subjective:   Patient remains critically ill Requiring multiple pressor support Cooling blanket Patient is currently paralyzed and sedated Urine output 2900 cc Serum creatinine slightly improved from 3.06-2.2 Calcium is low at 6.0   Objective:  Vital signs in last 24 hours:  Temp:  [98.6 F (37 C)-101.7 F (38.7 C)] 101.1 F (38.4 C) (04/10 0900) Pulse Rate:  [107-137] 136 (04/10 0900) Resp:  [0-20] 0 (04/10 1100) BP: (84-116)/(59-84) 96/82 (04/10 1100) SpO2:  [72 %-88 %] 77 % (04/10 0900) FiO2 (%):  [100 %] 100 % (04/10 0800) Weight:  [256 lb 13.4 oz (116.5 kg)] 256 lb 13.4 oz (116.5 kg) (04/10 0333)  Weight change: 20 lb 1 oz (9.1 kg) Filed Weights   01/06/18 2337 01/07/18 0142 01/08/18 0333  Weight: 236 lb 12.4 oz (107.4 kg) 236 lb 12.4 oz (107.4 kg) 256 lb 13.4 oz (116.5 kg)    Intake/Output:    Intake/Output Summary (Last 24 hours) at 01/08/2018 1158 Last data filed at 01/08/2018 1112 Gross per 24 hour  Intake 8532.99 ml  Output 2940 ml  Net 5592.99 ml     Physical Exam: General:  Critically ill-appearing  HEENT  ET tube, OG tube with dark aspirate  Neck  no masses  Pulm/lungs  ventilator assisted, FiO2 90%, PEEP 14  CVS/Heart  tachycardic  Abdomen:   Distended  Extremities: + Edema  Neurologic:  Sedated and paralyzed  Skin:   extremities cool to touch    Foley catheter in place       Basic Metabolic Panel:  Recent Labs  Lab 01/06/18 1931 01/06/18 2113 01/07/18 0047 01/08/18 0424  NA 136  --  137 139  K 3.9  --  4.0 3.5  CL 99*  --  106 103  CO2 19*  --  21* 26  GLUCOSE 149*  --  118* 161*  BUN 57*  --  58* 50*  CREATININE 3.46* 2.60* 3.06* 2.21*  CALCIUM 8.6*  --  6.9* 6.0*     CBC: Recent Labs  Lab 01/06/18 1931 01/07/18 0601 01/08/18 0424  WBC 8.3 0.8* 1.5*  NEUTROABS 6.0  --   --   HGB 14.5 13.3 12.4*  HCT 45.5 43.0 39.3*  MCV 85.5 88.1 85.7  PLT 167 127* 122*      No results found for: HEPBSAG, HEPBSAB, HEPBIGM    Microbiology:  Recent Results (from the past 240 hour(s))  Blood Culture (routine x 2)     Status: None (Preliminary result)   Collection Time: 01/06/18  7:44 PM  Result Value Ref Range Status   Specimen Description BLOOD BLOOD LEFT WRIST  Final   Special Requests   Final    BOTTLES DRAWN AEROBIC AND ANAEROBIC Blood Culture adequate volume   Culture   Final    NO GROWTH 2 DAYS Performed at [facility], [address] [place], [place] [address]    Report Status PENDING  Incomplete  Urine culture     Status: None (Preliminary result)   Collection Time: 01/06/18  7:44 PM  Result Value Ref Range Status   Specimen Description   Final    URINE, RANDOM Performed at [facility], [address], [place], [place] [address]    Special Requests   Final    NONE Performed at [facility], [address], [place], [place] [address]    Culture   Final    CULTURE REINCUBATED FOR BETTER GROWTH Performed  at [facility], [place] [address] [place], [place] [address]    Report Status PENDING  Incomplete  MRSA PCR Screening     Status: Abnormal   Collection Time: 01/06/18 11:51 PM  Result Value Ref Range Status   MRSA by PCR POSITIVE (A) NEGATIVE Final    Comment:        The GeneXpert MRSA Assay (FDA approved for NASAL specimens only), is one component of a comprehensive MRSA colonization surveillance program. It is not intended to diagnose MRSA infection nor to guide or monitor treatment for MRSA infections. RESULT CALLED TO, READ BACK BY AND VERIFIED WITH: BETH BUONO 01/07/18 @ 0121  [place]   Performed at [facility], [address] [place], [place] [address]   Blood Culture (routine x 2)     Status: None (Preliminary result)   Collection Time: 01/07/18  7:19 AM  Result Value Ref Range Status   Specimen Description BLOOD RIGHT HAND  Final   Special Requests   Final    BOTTLES  DRAWN AEROBIC AND ANAEROBIC Blood Culture results may not be optimal due to an inadequate volume of blood received in culture bottles   Culture   Final    NO GROWTH 1 DAY Performed at [facility], [address] [place], [place] [address]    Report Status PENDING  Incomplete  Culture, respiratory (NON-Expectorated)     Status: None (Preliminary result)   Collection Time: 01/07/18 12:28 PM  Result Value Ref Range Status   Specimen Description   Final    TRACHEAL ASPIRATE Performed at [facility], [address] [place], [place] [address]    Special Requests   Final    Normal Performed at [facility], [address] [place], [place] [address]    Gram Stain   Final    MODERATE WBC PRESENT,BOTH PMN AND MONONUCLEAR RARE GRAM POSITIVE COCCI RARE YEAST    Culture   Final    CULTURE REINCUBATED FOR BETTER GROWTH Performed at [facility], [place] [address] [place], [place] [address]    Report Status PENDING  Incomplete    Coagulation Studies: Recent Labs    01/06/18 1931  LABPROT 16.7*  INR 1.36    Urinalysis: Recent Labs    01/06/18 1944  COLORURINE AMBER*  LABSPEC 1.025  PHURINE 5.0  GLUCOSEU 50*  HGBUR NEGATIVE  BILIRUBINUR NEGATIVE  KETONESUR 5*  PROTEINUR 100*  NITRITE NEGATIVE  LEUKOCYTESUR MODERATE*      Imaging: Ct Abdomen Pelvis Wo Contrast  Result Date: 01/06/2018 CLINICAL DATA:  Respiratory arrest.  Suspect pulmonary embolism. EXAM: CT CHEST, ABDOMEN AND PELVIS WITHOUT CONTRAST TECHNIQUE: Multidetector CT imaging of the chest, abdomen and pelvis was performed following the standard protocol without IV contrast. COMPARISON:  Chest radiograph January 06, 2018 FINDINGS: CT CHEST FINDINGS CARDIOVASCULAR: Heart and pericardium are unremarkable. Thoracic aorta is normal course and caliber, mild calcific atherosclerosis. MEDIASTINUM/NODES: No mediastinal mass. No lymphadenopathy by CT size criteria limited assessment without  contrast. Normal appearance of thoracic esophagus though not tailored for evaluation. LUNGS/PLEURA: Tracheobronchial tree is patent, no pneumothorax. Endotracheal tube tip above the carina. Bilateral tree-in-bud infiltrates and dense consolidation with air bronchograms. RIGHT upper lobe collapse with soft tissue effacing RIGHT upper lobe bronchus. MUSCULOSKELETAL: Included soft tissues and included osseous structures appear normal. CT ABDOMEN PELVIS FINDINGS HEPATOBILIARY: Subcentimeter layering gallstones without CT findings of acute cholecystitis by noncontrast CT. Trace perihepatic focal fat. PANCREAS: Normal. SPLEEN: Normal. ADRENALS/URINARY TRACT:  Kidneys are orthotopic, demonstrating normal size and morphology. No nephrolithiasis, hydronephrosis; limited assessment for renal masses on this nonenhanced examination. The unopacified ureters are normal in course and caliber. Urinary bladder decompressed by Foley catheter. Normal adrenal glands. STOMACH/BOWEL: Nasogastric tube terminates in distal stomach. Gas distended small bowel measuring to 3.8 cm with multiple transition points in air-fluid levels. Large bowel normal in course and caliber. Normal appendix. VASCULAR/LYMPHATIC: Mildly ectatic 2.4 cm infrarenal aorta. Mild calcific atherosclerosis. No lymphadenopathy by CT size criteria. REPRODUCTIVE: Normal. OTHER: No intraperitoneal free fluid or free air. MUSCULOSKELETAL: Non-acute. Small fat containing LEFT inguinal hernia. Mild L5-S1 degenerative disc. IMPRESSION: CT CHEST: 1. Pulmonary embolism cannot be excluded by noncontrast CT. 2. Multifocal severe pneumonia with RIGHT upper lobe collapse and soft tissue obstructing RIGHT upper lobe bronchus. Consider bronchoscopy. 3. Endotracheal tube tip above the carina. CT ABDOMEN AND PELVIS: 1. Mild small bowel ileus, potential enteritis. Nasogastric tube terminates in distal stomach. 2. Cholelithiasis without CT findings of acute cholecystitis. Aortic  Atherosclerosis (ICD10-I70.0). Electronically Signed   By: Elon Alas M.D.   On: 01/06/2018 21:44   Ct Chest Wo Contrast  Result Date: 01/06/2018 CLINICAL DATA:  Respiratory arrest.  Suspect pulmonary embolism. EXAM: CT CHEST, ABDOMEN AND PELVIS WITHOUT CONTRAST TECHNIQUE: Multidetector CT imaging of the chest, abdomen and pelvis was performed following the standard protocol without IV contrast. COMPARISON:  Chest radiograph January 06, 2018 FINDINGS: CT CHEST FINDINGS CARDIOVASCULAR: Heart and pericardium are unremarkable. Thoracic aorta is normal course and caliber, mild calcific atherosclerosis. MEDIASTINUM/NODES: No mediastinal mass. No lymphadenopathy by CT size criteria limited assessment without contrast. Normal appearance of thoracic esophagus though not tailored for evaluation. LUNGS/PLEURA: Tracheobronchial tree is patent, no pneumothorax. Endotracheal tube tip above the carina. Bilateral tree-in-bud infiltrates and dense consolidation with air bronchograms. RIGHT upper lobe collapse with soft tissue effacing RIGHT upper lobe bronchus. MUSCULOSKELETAL: Included soft tissues and included osseous structures appear normal. CT ABDOMEN PELVIS FINDINGS HEPATOBILIARY: Subcentimeter layering gallstones without CT findings of acute cholecystitis by noncontrast CT. Trace perihepatic focal fat. PANCREAS: Normal. SPLEEN: Normal. ADRENALS/URINARY TRACT: Kidneys are orthotopic, demonstrating normal size and morphology. No nephrolithiasis, hydronephrosis; limited assessment for renal masses on this nonenhanced examination. The unopacified ureters are normal in course and caliber. Urinary bladder decompressed by Foley catheter. Normal adrenal glands. STOMACH/BOWEL: Nasogastric tube terminates in distal stomach. Gas distended small bowel measuring to 3.8 cm with multiple transition points in air-fluid levels. Large bowel normal in course and caliber. Normal appendix. VASCULAR/LYMPHATIC: Mildly ectatic 2.4 cm  infrarenal aorta. Mild calcific atherosclerosis. No lymphadenopathy by CT size criteria. REPRODUCTIVE: Normal. OTHER: No intraperitoneal free fluid or free air. MUSCULOSKELETAL: Non-acute. Small fat containing LEFT inguinal hernia. Mild L5-S1 degenerative disc. IMPRESSION: CT CHEST: 1. Pulmonary embolism cannot be excluded by noncontrast CT. 2. Multifocal severe pneumonia with RIGHT upper lobe collapse and soft tissue obstructing RIGHT upper lobe bronchus. Consider bronchoscopy. 3. Endotracheal tube tip above the carina. CT ABDOMEN AND PELVIS: 1. Mild small bowel ileus, potential enteritis. Nasogastric tube terminates in distal stomach. 2. Cholelithiasis without CT findings of acute cholecystitis. Aortic Atherosclerosis (ICD10-I70.0). Electronically Signed   By: Elon Alas M.D.   On: 01/06/2018 21:44   Dg Chest Port 1 View  Result Date: 01/08/2018 CLINICAL DATA:  Respiratory failure EXAM: PORTABLE CHEST 1 VIEW COMPARISON:  Portable exam 1003 hours compared to 01/07/2018 FINDINGS: Tip of endotracheal tube projects 4.4 cm above carina. Nasogastric tube extends into abdomen. Enlargement of cardiac silhouette. Diffuse BILATERAL airspace infiltrates increased in upper  lobes since previous exam. No gross pleural effusion or pneumothorax. IMPRESSION: Increased BILATERAL airspace infiltrates. Electronically Signed   By: Lavonia Dana M.D.   On: 01/08/2018 10:33   Dg Chest Port 1 View  Result Date: 01/07/2018 CLINICAL DATA:  Acute respiratory failure.  Hypoxia. EXAM: PORTABLE CHEST 1 VIEW COMPARISON:  CT 6 hours prior, radiographs 8 hours prior. FINDINGS: Endotracheal tube 3.9 cm from the carina. Enteric tube in place with tip below the diaphragm, not included in the field of view. Progressive bilateral lung opacities, greatest in the lung bases with air bronchograms. Heart appears normal in size. No large pleural effusion. No pneumothorax. IMPRESSION: Progressive bilateral lung opacities, greatest in the lung  bases, may reflect pneumonia or aspiration. Pulmonary edema is felt less likely. Electronically Signed   By: Jeb Levering M.D.   On: 01/07/2018 04:01   Dg Chest Portable 1 View  Result Date: 01/06/2018 CLINICAL DATA:  ETT placement EXAM: PORTABLE CHEST 1 VIEW COMPARISON:  None. FINDINGS: Endotracheal tube tip is about 2.4 cm superior to the carina. Low lung volumes. No pleural effusion or pneumothorax. Heart size within normal limits. Streaky left greater than right hilar opacity. IMPRESSION: 1. Endotracheal tube tip about 2.4 cm superior to the carina 2. Low lung volumes. Streaky left greater than right perihilar opacity of uncertain chronicity. Electronically Signed   By: Donavan Foil M.D.   On: 01/06/2018 19:35     Medications:   . sodium chloride 10 mL/hr at 01/08/18 0623  . ceFEPime (MAXIPIME) IV    . cisatracurium (NIMBEX) infusion 3 mcg/kg/min (01/08/18 1142)  . fentaNYL 250 mcg/hr (01/08/18 1144)  . fentaNYL infusion INTRAVENOUS    . metronidazole 500 mg (01/08/18 1112)  . midazolam (VERSED) infusion 2 mg/hr (01/08/18 1140)  . norepinephrine (LEVOPHED) Adult infusion 20 mcg/min (01/08/18 1129)  . phenylephrine (NEO-SYNEPHRINE) Adult infusion 375 mcg/min (01/08/18 1128)  . vancomycin Stopped (01/08/18 0709)  . vasopressin (PITRESSIN) infusion - *FOR SHOCK* 0.03 Units/min (01/08/18 0700)   . artificial tears  1 application Both Eyes D6U  . chlorhexidine gluconate (MEDLINE KIT)  15 mL Mouth Rinse BID  . heparin injection (subcutaneous)  5,000 Units Subcutaneous Q8H  . hydrocortisone sod succinate (SOLU-CORTEF) inj  50 mg Intravenous Q6H  . mouth rinse  15 mL Mouth Rinse 10 times per day  . pantoprazole (PROTONIX) IV  40 mg Intravenous Daily   sodium chloride, acetaminophen, fentaNYL, LORazepam, midazolam, midazolam, ondansetron (ZOFRAN) IV, vecuronium  Assessment/ Plan:  57 y.o. african [place] male with schizophrenia, traumatic brain injury 2009 requiring prolonged hospital  stay for over 2 months, ventilator support, tracheostomy, diabetes, hypertension, atherosclerosis, was admitted on 01/06/2018 with severe sepsis and multifocal pneumonia.  Patient is a current smoker and is resident of assisted living facility  1.  Acute renal failure, likely severe ATN from concurrent illness and sepsis Baseline creatinine is unknown  Serum creatinine, urine output has improved. Electrolytes and volume status are acceptable.  No acute indication for dialysis at present.    2.  Acute respiratory failure Likely secondary to severe pneumonia  3. Sepsis Requiring multiple pressors Management as per ICU team     LOS: 2 Avraj Lindroth 4/10/201911:58 AM  [facility] [place], [place]  Note: This note was prepared with Dragon dictation. Any transcription errors are unintentional

## 2018-01-08 NOTE — Progress Notes (Addendum)
`   Pharmacy Antibiotic Note  Tommy Mathews is a 57 y.o. male admitted on 01/06/2018 with worsening respiratory failure requiring mechanical ventialtion. Patient with presumed aspiration event and positive MRSA PCR. Pharmacy has been consulted for Unasyn and Vancomycin dosing.  Plan: Unasyn 3g IV Q12hr.   Patient last received vancomycin at 0600. Patient with worsening renal function. Will obtain vancomycin random level with am labs. Will reorder vancomycin based on level.   04/10 @ 0424 VR 11 Scr 3.06 >> 2.21 Ke 0.04489 T1/2 15 ~ 12 hrs and decreased dose to vanc 1g  Goal trough 15 - 20 mcg/mL w/ Css around 16 mcg/mL Will restart patient on vanc 1g IV q12h and will check a VT 04/11 @ 1700 prior to 4th dose. Will d/c regimen if renal function declines. Will continue to monitor renal function.  Height: 5\' 10"  (177.8 cm) Weight: 256 lb 13.4 oz (116.5 kg) IBW/kg (Calculated) : 73  Temp (24hrs), Avg:99.9 F (37.7 C), Min:98.6 F (37 C), Max:101.7 F (38.7 C)  Recent Labs  Lab 01/06/18 1931 01/06/18 2113 01/07/18 0039 01/07/18 0047 01/07/18 0601 01/07/18 0901 01/08/18 0424  WBC 8.3  --   --   --  0.8*  --  1.5*  CREATININE 3.46* 2.60*  --  3.06*  --   --  2.21*  LATICACIDVEN 6.7*  --  4.3*  --  4.7* 5.1*  --   VANCORANDOM  --   --   --   --   --   --  11    Estimated Creatinine Clearance: 48.3 mL/min (A) (by C-G formula based on SCr of 2.21 mg/dL (H)).    Not on File  Antimicrobials this admission: Cefepime 4/8 x 1 Vancomycin 4/8 >>  Unasyn 4/9 >>   Dose adjustments this admission: 4/9 Vancomycin held pending level   Microbiology results: 4/8 BCx x 1:no growth < 12 hours 4/8 MRSA PCR: positive   Thank you for allowing pharmacy to be a part of this patient's care.  Thomasene Rippleavid Farzad Tibbetts, PharmD, BCPS Clinical Pharmacist 01/08/2018

## 2018-01-08 NOTE — Progress Notes (Signed)
Ice bags reapplied.

## 2018-01-08 NOTE — Progress Notes (Signed)
Cooling blanket applied.

## 2018-01-08 NOTE — Progress Notes (Signed)
Patient had a cuff leak that gradually increased during the day, Pressures to keep seal increased as well. Dr. Sung AmabileSimonds came to bedside to assess patient and ordered ET tube to be advanced 2 cm to 25cm at the lip. Cuff inflated and was able to be sealed with 27-28cm H20.

## 2018-01-08 NOTE — Progress Notes (Addendum)
Spoke to Dr. Sung AmabileSimonds- notified him that prior to hanging the nimbex and versed drip- pt was able to twitch twice with the train of four then once versed drip @ 2mg /hr and nimbex drip @ 343mcq/kg/min was hung- my reassessment showed that patient would not twitch.  Per Dr. Sung AmabileSimonds- keep fentanyl at previous dose prior to nimbex drip and keep versed drip at current rate infusing and decrease Nimbex drip.

## 2018-01-08 NOTE — Progress Notes (Signed)
Decreased FIO2 to 60%, saturations are 94% at this time.

## 2018-01-08 NOTE — Progress Notes (Signed)
`   Pharmacy Antibiotic Note  Zorita PangJames Slaby is a 57 y.o. male admitted on 01/06/2018 with worsening respiratory failure requiring mechanical ventialtion. Patient with presumed aspiration event and positive MRSA PCR. Patient transitioned from Unasyn to Cefepime/Metronidazole during am rounds on 4/10. Pharmacy has been consulted for Cefepime and Vancomycin dosing.  Plan: Due to severity of illness, will initiate cefepime 2g IV Q12hr. Will following renal function closely as it has been dynamic over the past 24-36 hours. Cefepime chosen due to renal issues and patient requiring vancomycin. Metronidazole added to cover for possible anaerobes in setting of possible aspiration pneumonia.   Patient ordered vancomycin 1000mg  IV Q12hr for goal trough of 15-20. Will continue for now. Will recheck serum creatinine with am labs.   Height: 5\' 10"  (177.8 cm) Weight: 256 lb 13.4 oz (116.5 kg) IBW/kg (Calculated) : 73  Temp (24hrs), Avg:100.6 F (38.1 C), Min:98.1 F (36.7 C), Max:101.7 F (38.7 C)  Recent Labs  Lab 01/06/18 1931 01/06/18 2113 01/07/18 0039 01/07/18 0047 01/07/18 0601 01/07/18 0901 01/08/18 0424  WBC 8.3  --   --   --  0.8*  --  1.5*  CREATININE 3.46* 2.60*  --  3.06*  --   --  2.21*  LATICACIDVEN 6.7*  --  4.3*  --  4.7* 5.1*  --   VANCORANDOM  --   --   --   --   --   --  11    Estimated Creatinine Clearance: 48.3 mL/min (A) (by C-G formula based on SCr of 2.21 mg/dL (H)).    Not on File  Antimicrobials this admission: Cefepime 4/8 x 1 Vancomycin 4/8 >>  Unasyn 4/9 >> 4/10 Cefepime 4/10 >>  Metronidazole 4/10 >>  Dose adjustments this admission: 4/9 Vancomycin held pending level   Microbiology results: 4/8 BCx x 1:no growth < 12 hours 4/8 UCx: 10K Staph Species  4/9 Tracheal Aspirate: culture reincubated for better growth  4/8 MRSA PCR: positive   Thank you for allowing pharmacy to be a part of this patient's care.  Simpson,Michael L 01/08/2018 8:20 PM

## 2018-01-08 NOTE — Clinical Social Work Note (Signed)
Clinical Social Work Assessment  Patient Details  Name: Tommy Mathews MRN: [id number] Date of Birth: 1960/12/03  Date of referral:  01/08/18               Reason for consult:  Discharge Planning                Permission sought to share information with:    Permission granted to share information::     Name::        Agency::     Relationship::     Contact Information:     Housing/Transportation Living arrangements for the past 2 months:  Group Home Source of Information:  Facility Patient Interpreter Needed:  None Criminal Activity/Legal Involvement Pertinent to Current Situation/Hospitalization:  No - Comment as needed Significant Relationships:  Siblings, Parents Lives with:  Facility Resident Do you feel safe going back to the place where you live?    Need for family participation in patient care:  Yes (Comment)  Care giving concerns:  [facility] owned by Jimmye NormanLawanda Ray: [phone].   Social Worker assessment / plan:  CSW contacted Lawanda yesterday regarding patient. Rowan BlaseLawanda states that patient is his own representative and that he can return when ready for discharge. CSW unable to speak with patient currently as he is sedated. CSW has left a message for patient's mother: Fatima Sangerlizabeth Mcadam: [phone].   Employment status:  Disabled (Comment on whether or not currently receiving Disability) Insurance information:  [place] Recommendations:  Not assessed at this time Information / Referral to community resources:     Patient/Family's Response to care:  Unknown as unable to speak with patient or relative at this time.  Patient/Family's Understanding of and Emotional Response to Diagnosis, Current Treatment, and Prognosis:  Unknown as unable to speak with patient or relative at this time.  Emotional Assessment Appearance:    Attitude/Demeanor/Rapport:  (patient currently sedated in ICU) Affect (typically observed):    Orientation:   (patient currently sedated in ICU) Alcohol / Substance use:  Not Applicable Psych involvement (Current and /or in the community):  No (Comment)  Discharge Needs  Concerns to be addressed:    Readmission within the last 30 days:    Current discharge risk:  None Barriers to Discharge:  No Barriers Identified   York SpanielMonica Majesty Stehlin, LCSW 01/08/2018, 1:35 PM

## 2018-01-09 DIAGNOSIS — I480 Paroxysmal atrial fibrillation: Secondary | ICD-10-CM

## 2018-01-09 LAB — CREATININE, SERUM
CREATININE: 1.73 mg/dL — AB (ref 0.61–1.24)
GFR, EST AFRICAN AMERICAN: 49 mL/min — AB (ref >60)
GFR, EST NON AFRICAN AMERICAN: 43 mL/min — AB (ref >60)

## 2018-01-09 LAB — CULTURE, RESPIRATORY W GRAM STAIN
Culture: NORMAL
Special Requests: NORMAL

## 2018-01-09 LAB — GLUCOSE, CAPILLARY
GLUCOSE-CAPILLARY: 123 mg/dL — AB (ref 65–99)
GLUCOSE-CAPILLARY: 154 mg/dL — AB (ref 65–99)
GLUCOSE-CAPILLARY: 174 mg/dL — AB (ref 65–99)
Glucose-Capillary: 134 mg/dL — ABNORMAL HIGH (ref 65–99)
Glucose-Capillary: 136 mg/dL — ABNORMAL HIGH (ref 65–99)
Glucose-Capillary: 156 mg/dL — ABNORMAL HIGH (ref 65–99)
Glucose-Capillary: 158 mg/dL — ABNORMAL HIGH (ref 65–99)

## 2018-01-09 LAB — VANCOMYCIN, TROUGH: Vancomycin Tr: 22 ug/mL (ref 15–20)

## 2018-01-09 LAB — PROCALCITONIN: Procalcitonin: 27.48 ng/mL

## 2018-01-09 MED ORDER — PRO-STAT SUGAR FREE PO LIQD
30.0000 mL | Freq: Two times a day (BID) | ORAL | Status: DC
  Administered 2018-01-09 – 2018-01-13 (×9): 30 mL

## 2018-01-09 MED ORDER — AMIODARONE HCL IN DEXTROSE 360-4.14 MG/200ML-% IV SOLN
INTRAVENOUS | Status: AC
  Filled 2018-01-09: qty 200

## 2018-01-09 MED ORDER — AMIODARONE IV BOLUS ONLY 150 MG/100ML: 150.0000 mg | Freq: Once | INTRAVENOUS | Status: DC

## 2018-01-09 MED ORDER — FENTANYL BOLUS VIA INFUSION
50.0000 ug | INTRAVENOUS | Status: DC | PRN
  Administered 2018-01-10 – 2018-01-19 (×8): 50 ug via INTRAVENOUS
  Filled 2018-01-09: qty 50

## 2018-01-09 MED ORDER — AMIODARONE HCL IN DEXTROSE 360-4.14 MG/200ML-% IV SOLN
60.0000 mg/h | INTRAVENOUS | Status: AC
  Administered 2018-01-09: 60 mg/h via INTRAVENOUS
  Filled 2018-01-09 (×2): qty 200

## 2018-01-09 MED ORDER — VITAL HIGH PROTEIN PO LIQD
1000.0000 mL | ORAL | Status: DC
  Administered 2018-01-09 – 2018-01-11 (×3): 1000 mL
  Administered 2018-01-12: 20 mL/h
  Administered 2018-01-13 (×2): 1000 mL

## 2018-01-09 MED ORDER — AMIODARONE HCL IN DEXTROSE 360-4.14 MG/200ML-% IV SOLN
60.0000 mg/h | INTRAVENOUS | Status: DC
  Administered 2018-01-09 (×2): 30 mg/h via INTRAVENOUS
  Administered 2018-01-10 – 2018-01-11 (×7): 60 mg/h via INTRAVENOUS
  Filled 2018-01-09 (×8): qty 200

## 2018-01-09 MED ORDER — AMIODARONE LOAD VIA INFUSION
150.0000 mg | Freq: Once | INTRAVENOUS | Status: AC
  Administered 2018-01-09: 150 mg via INTRAVENOUS

## 2018-01-09 MED ORDER — ACETAMINOPHEN 10 MG/ML IV SOLN
1000.0000 mg | Freq: Four times a day (QID) | INTRAVENOUS | Status: AC
  Administered 2018-01-09: 1000 mg via INTRAVENOUS
  Filled 2018-01-09: qty 100

## 2018-01-09 MED ORDER — AMIODARONE IV BOLUS ONLY 150 MG/100ML
150.0000 mg | Freq: Once | INTRAVENOUS | Status: AC
  Administered 2018-01-09: 150 mg via INTRAVENOUS

## 2018-01-09 MED ORDER — PANTOPRAZOLE SODIUM 40 MG PO PACK
40.0000 mg | PACK | Freq: Every day | ORAL | Status: DC
  Administered 2018-01-10 – 2018-03-19 (×69): 40 mg
  Filled 2018-01-09 (×69): qty 20

## 2018-01-09 MED ORDER — VANCOMYCIN HCL IN DEXTROSE 750-5 MG/150ML-% IV SOLN
750.0000 mg | Freq: Two times a day (BID) | INTRAVENOUS | Status: DC
  Administered 2018-01-09 – 2018-01-11 (×4): 750 mg via INTRAVENOUS
  Filled 2018-01-09 (×6): qty 150

## 2018-01-09 MED ORDER — VECURONIUM BROMIDE 10 MG IV SOLR
10.0000 mg | INTRAVENOUS | Status: DC | PRN
  Administered 2018-01-12 – 2018-01-20 (×27): 10 mg via INTRAVENOUS
  Filled 2018-01-09 (×29): qty 10

## 2018-01-09 NOTE — Progress Notes (Addendum)
[facility], [place] 01/09/18  Subjective:   Patient remains critically ill Requiring multiple pressor support Cooling blanket Patient is currently paralyzed and sedated Urine output 1275 cc Serum creatinine slightly improved from 3.06-2.2->1.73 Calcium is low at 6.0   Objective:  Vital signs in last 24 hours:  Temp:  [98.1 F (36.7 C)-102.1 F (38.9 C)] 102 F (38.9 C) (04/11 0855) Pulse Rate:  [110-129] 125 (04/11 0845) Resp:  [0-30] 30 (04/11 0845) BP: (88-113)/(60-85) 112/83 (04/11 0845) SpO2:  [92 %-99 %] 94 % (04/11 0845) FiO2 (%):  [60 %-100 %] 60 % (04/11 0812) Weight:  [261 lb 0.4 oz (118.4 kg)] 261 lb 0.4 oz (118.4 kg) (04/11 0500)  Weight change: 4 lb 3 oz (1.9 kg) Filed Weights   01/07/18 0142 01/08/18 0333 01/09/18 0500  Weight: 236 lb 12.4 oz (107.4 kg) 256 lb 13.4 oz (116.5 kg) 261 lb 0.4 oz (118.4 kg)    Intake/Output:    Intake/Output Summary (Last 24 hours) at 01/09/2018 0916 Last data filed at 01/09/2018 0725 Gross per 24 hour  Intake 5672.61 ml  Output 1825 ml  Net 3847.61 ml     Physical Exam: General:  Critically ill-appearing, cooling blanket in place  HEENT  ET tube, OG tube  Neck  no masses  Pulm/lungs  ventilator assisted, FiO2 60%, PEEP 14  CVS/Heart  tachycardic  Abdomen:   Distended  Extremities: + Edema  Neurologic:  Sedated and paralyzed  Skin:   extremities cool to touch    Foley catheter in place       Basic Metabolic Panel:  Recent Labs  Lab 01/06/18 1931 01/06/18 2113 01/07/18 0047 01/08/18 0424 01/09/18 0626  NA 136  --  137 139  --   K 3.9  --  4.0 3.5  --   CL 99*  --  106 103  --   CO2 19*  --  21* 26  --   GLUCOSE 149*  --  118* 161*  --   BUN 57*  --  58* 50*  --   CREATININE 3.46* 2.60* 3.06* 2.21* 1.73*  CALCIUM 8.6*  --  6.9* 6.0*  --      CBC: Recent Labs  Lab 01/06/18 1931 01/07/18 0601 01/08/18 0424  WBC 8.3 0.8* 1.5*  NEUTROABS 6.0  --   --   HGB 14.5 13.3  12.4*  HCT 45.5 43.0 39.3*  MCV 85.5 88.1 85.7  PLT 167 127* 122*     No results found for: HEPBSAG, HEPBSAB, HEPBIGM    Microbiology:  Recent Results (from the past 240 hour(s))  Blood Culture (routine x 2)     Status: None (Preliminary result)   Collection Time: 01/06/18  7:44 PM  Result Value Ref Range Status   Specimen Description BLOOD BLOOD LEFT WRIST  Final   Special Requests   Final    BOTTLES DRAWN AEROBIC AND ANAEROBIC Blood Culture adequate volume   Culture   Final    NO GROWTH 3 DAYS Performed at [facility], [address], [place], [place] [address]    Report Status PENDING  Incomplete  Urine culture     Status: Abnormal   Collection Time: 01/06/18  7:44 PM  Result Value Ref Range Status   Specimen Description   Final    URINE, RANDOM Performed at [facility], [address], [place],  [address]    Special Requests   Final    NONE Performed at [facility], [place], [place] [address]    Culture (A)  Final    10,000 COLONIES/mL STAPHYLOCOCCUS SPECIES (COAGULASE NEGATIVE) CALL MICROBIOLOGY LAB IF SENSITIVITIES ARE REQUIRED. Performed at [facility], [place] [address], [place], [place] [address]    Report Status 01/08/2018 FINAL  Final  MRSA PCR Screening     Status: Abnormal   Collection Time: 01/06/18 11:51 PM  Result Value Ref Range Status   MRSA by PCR POSITIVE (A) NEGATIVE Final    Comment:        The GeneXpert MRSA Assay (FDA approved for NASAL specimens only), is one component of a comprehensive MRSA colonization surveillance program. It is not intended to diagnose MRSA infection nor to guide or monitor treatment for MRSA infections. RESULT CALLED TO, READ BACK BY AND VERIFIED WITH: BETH BUONO 01/07/18 @ 0121  [place]   Performed at [facility], [address] [place], [place] [address]   Blood Culture (routine x 2)     Status: None (Preliminary result)   Collection Time:  01/07/18  7:19 AM  Result Value Ref Range Status   Specimen Description BLOOD RIGHT HAND  Final   Special Requests   Final    BOTTLES DRAWN AEROBIC AND ANAEROBIC Blood Culture results may not be optimal due to an inadequate volume of blood received in culture bottles   Culture   Final    NO GROWTH 2 DAYS Performed at [facility], [address] [place], [place] [address]    Report Status PENDING  Incomplete  Culture, respiratory (NON-Expectorated)     Status: None (Preliminary result)   Collection Time: 01/07/18 12:28 PM  Result Value Ref Range Status   Specimen Description   Final    TRACHEAL ASPIRATE Performed at [facility], [address], [place], [place] [address]    Special Requests   Final    Normal Performed at [facility], [address] [place], [place] [address]    Gram Stain   Final    MODERATE WBC PRESENT,BOTH PMN AND MONONUCLEAR RARE GRAM POSITIVE COCCI RARE YEAST    Culture   Final    CULTURE REINCUBATED FOR BETTER GROWTH Performed at [facility], [place] [address] [place],  [address]    Report Status PENDING  Incomplete    Coagulation Studies: Recent Labs    01/06/18 1931  LABPROT 16.7*  INR 1.36    Urinalysis: Recent Labs    01/06/18 1944  COLORURINE AMBER*  LABSPEC 1.025  PHURINE 5.0  GLUCOSEU 50*  HGBUR NEGATIVE  BILIRUBINUR NEGATIVE  KETONESUR 5*  PROTEINUR 100*  NITRITE NEGATIVE  LEUKOCYTESUR MODERATE*      Imaging: Dg Chest Port 1 View  Result Date: 01/08/2018 CLINICAL DATA:  Respiratory failure EXAM: PORTABLE CHEST 1 VIEW COMPARISON:  Portable exam 1003 hours compared to 01/07/2018 FINDINGS: Tip of endotracheal tube projects 4.4 cm above carina. Nasogastric tube extends into abdomen. Enlargement of cardiac silhouette. Diffuse BILATERAL airspace infiltrates increased in upper lobes since previous exam. No gross pleural effusion or pneumothorax. IMPRESSION: Increased BILATERAL airspace  infiltrates. Electronically Signed   By: Lavonia Dana M.D.   On: 01/08/2018 10:33     Medications:   . sodium chloride 10 mL/hr at 01/08/18 0623  . ceFEPime (MAXIPIME) IV Stopped (01/09/18 0047)  . cisatracurium (NIMBEX) infusion Stopped (01/09/18 0900)  . fentaNYL 250 mcg/hr (01/09/18 8403)  . fentaNYL infusion INTRAVENOUS 250 mcg/hr (01/09/18 0700)  . metronidazole Stopped (  01/09/18 0433)  . midazolam (VERSED) infusion 2 mg/hr (01/09/18 0830)  . norepinephrine (LEVOPHED) Adult infusion 15 mcg/min (01/09/18 0700)  . phenylephrine (NEO-SYNEPHRINE) Adult infusion 260 mcg/min (01/09/18 0900)  . vancomycin Stopped (01/09/18 0636)  . vasopressin (PITRESSIN) infusion - *FOR SHOCK* 0.03 Units/min (01/09/18 0700)   . artificial tears  1 application Both Eyes K4Y  . chlorhexidine gluconate (MEDLINE KIT)  15 mL Mouth Rinse BID  . Chlorhexidine Gluconate Cloth  6 each Topical Q0600  . heparin injection (subcutaneous)  5,000 Units Subcutaneous Q8H  . hydrocortisone sod succinate (SOLU-CORTEF) inj  50 mg Intravenous Q6H  . insulin aspart  0-15 Units Subcutaneous Q4H  . mouth rinse  15 mL Mouth Rinse 10 times per day  . mupirocin ointment  1 application Nasal BID  . pantoprazole (PROTONIX) IV  40 mg Intravenous Daily   sodium chloride, acetaminophen, fentaNYL, LORazepam, midazolam, midazolam, ondansetron (ZOFRAN) IV, vecuronium  Assessment/ Plan:  57 y.o. 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  Patient is a current smoker and is resident of assisted living facility  1.  Acute renal failure, likely severe ATN from concurrent illness and sepsis Baseline creatinine is unknown  UOP  1275 cc Serum creatinine, urine output has improved. Electrolytes and volume status are  acceptable.  No acute indication for dialysis at present.    2.  Acute respiratory failure Likely secondary to severe pneumonia  3. Sepsis Requiring multiple pressors Management as per ICU team    LOS: [place] 4/11/20199:16 [place], [place]  Note: This note was prepared with Dragon dictation. Any transcription errors are unintentional

## 2018-01-09 NOTE — Progress Notes (Signed)
`   Pharmacy Antibiotic Note  Tommy Mathews is a 57 y.o. male admitted on 01/06/2018 with worsening respiratory failure requiring mechanical ventialtion. Patient with presumed aspiration event and positive MRSA PCR. Patient transitioned from Unasyn to Cefepime/Metronidazole during am rounds on 4/10. Pharmacy has been consulted for Cefepime and Vancomycin dosing.  Plan: Continue cefepime 2g IV Q12hr. Metronidazole added to cover for possible anaerobes in setting of possible aspiration pneumonia.   Continue vancomycin 1000mg  IV Q12hr for goal trough of 15-20. Patient's renal function continues to improve trough scheduled for 4/11 at 1700.   4/11:  Vancomycin trough @ 1845= 22 mcg/ml. (~13hr after last dose). Scr improving 2.21 >> 1.73.  Patient may have saturated/accumulated. Will adjust Vancomycin to 750 mg IV q12h and start next dose at 2200. Renal fxn changing- follow closely. Trough prior to 4th dose of new regimen     Height: 5\' 10"  (177.8 cm) Weight: 261 lb 0.4 oz (118.4 kg) IBW/kg (Calculated) : 73  Temp (24hrs), Avg:99.9 F (37.7 C), Min:98.3 F (36.8 C), Max:102.1 F (38.9 C)  Recent Labs  Lab 01/06/18 1931 01/06/18 2113 01/07/18 0039 01/07/18 0047 01/07/18 0601 01/07/18 0901 01/08/18 0424 01/09/18 0626 01/09/18 1841  WBC 8.3  --   --   --  0.8*  --  1.5*  --   --   CREATININE 3.46* 2.60*  --  3.06*  --   --  2.21* 1.73*  --   LATICACIDVEN 6.7*  --  4.3*  --  4.7* 5.1*  --   --   --   VANCOTROUGH  --   --   --   --   --   --   --   --  22*  VANCORANDOM  --   --   --   --   --   --  11  --   --     Estimated Creatinine Clearance: 62.2 mL/min (A) (by C-G formula based on SCr of 1.73 mg/dL (H)).    Not on File  Antimicrobials this admission: Cefepime 4/8 x 1 Vancomycin 4/8 >>  Unasyn 4/9 >> 4/10 Cefepime 4/10 >>  Metronidazole 4/10 >>  Dose adjustments this admission: 4/10 Vancomycin changed to 1g IV Q12hr.  4/11  Vanc t= 22.  Dose chg from 1 gm q12h to 750 q12h     Microbiology results: 4/8 BCx x 1:no growth x 3 days 4/9 BCx x 1: no growth x 2 days  4/8 UCx: 10K Staph Species  4/9 Tracheal Aspirate: normal flora  4/8 MRSA PCR: positive   Thank you for allowing pharmacy to be a part of this patient's care.  Paiton Fosco A 01/09/2018 7:53 PM

## 2018-01-09 NOTE — Progress Notes (Addendum)
PULMONARY / CRITICAL CARE MEDICINE   Name: Tommy Mathews MRN: [id number] DOB: 1961/01/15    ADMISSION DATE:  01/06/2018   PT PROFILE:  41 M SNF resident due to psychiatric illness and history of TBI admitted with severe bilateral pneumonia, severe sepsis/septic shock.  Intubated in the ED shortly after arrival.  Was also noted to have dark coffee-ground material suctioned from stomach.  Patient chronically on rivaroxaban.   MAJOR EVENTS/TEST RESULTS: 04/08 Admission as above 04/08 CT chest: Multifocal severe pneumonia with RUL collapse and soft tissue obstructing RUL bronchus. 04/08 CTAP: Mild small bowel ileus, potential enteritis. Nasogastric tube terminates in distal stomach. Cholelithiasis without CT findings of acute cholecystitis.Consider bronchoscopy.  04/09 nephrology consultation: No acute indication for dialysis at present. 04/09 gastroenterology consultation: Recommend high-dose PPI therapy. 04/10 severely hypoxemic despite 100% FiO2.  Recruitment maneuver with significant improvement.  PEEP increased and ARDS strategy on ventilator implemented.  Neuromuscular blockade implemented due to ventilator dyssynchrony. 04/11 PAF with RVR > converted with amiodarone. Gas exchange improving. Off Nimbex  INDWELLING DEVICES:: ETT 04/08 >>  L femoral CVL 04/08 >>   MICRO DATA: MRSA PCR 04/08 >> POS Urine 04/08 >> 10k coag negative staph Resp 04/09 >> c/w NOF Blood 04/08 >>  Blood 04/09 >>   ANTIMICROBIALS:  Unasyn 04/08 >> 04/10 Vanc 04/08 >>  Cefepime 04/10 >>  Metronidazole 04/10 >>     SUBJECTIVE:  Off Nimbex. RASS -4, -5, not F/C.  Synchronous with ventilator  VITAL SIGNS: BP 104/72   Pulse 84   Temp 98.3 F (36.8 C) (Core)   Resp (!) 28   Ht 5\' 10"  (1.778 m)   Wt 261 lb 0.4 oz (118.4 kg)   SpO2 96%   BMI 37.45 kg/m   HEMODYNAMICS: CVP:  [18 mmHg-20 mmHg] 20 mmHg  VENTILATOR SETTINGS: Vent Mode: PRVC FiO2 (%):  [40 %-60 %] 40 % Set Rate:  [30 bmp] 30 bmp Vt  Set:  [400 mL] 400 mL PEEP:  [12 cmH20-14 cmH20] 12 cmH20 Plateau Pressure:  [29 cmH20-32 cmH20] 29 cmH20  INTAKE / OUTPUT: I/O last 3 completed shifts: In: 13996.8 [I.V.:12796.8; IV Piggyback:1200] Out: 3965 [Urine:3365; Emesis/NG output:600]  PHYSICAL EXAMINATION: General: RASS -4, -5.  Not F/C, synchronous with ventilator Neuro: PERRLA, EOMI, DTRs symmetric, no spontaneous movement HEENT: NCAT, sclerae white Cardiovascular: Tachy, regular, no M Lungs: Few rhonchi anteriorly, no wheezes Abdomen: Mildly distended, bowel sounds present Extremities: Warm, no edema Skin: No lesions noted  LABS:  BMET Recent Labs  Lab 01/06/18 1931  01/07/18 0047 01/08/18 0424 01/09/18 0626  NA 136  --  137 139  --   K 3.9  --  4.0 3.5  --   CL 99*  --  106 103  --   CO2 19*  --  21* 26  --   BUN 57*  --  58* 50*  --   CREATININE 3.46*   < > 3.06* 2.21* 1.73*  GLUCOSE 149*  --  118* 161*  --    < > = values in this interval not displayed.    Electrolytes Recent Labs  Lab 01/06/18 1931 01/07/18 0047 01/08/18 0424  CALCIUM 8.6* 6.9* 6.0*    CBC Recent Labs  Lab 01/06/18 1931 01/07/18 0601 01/08/18 0424  WBC 8.3 0.8* 1.5*  HGB 14.5 13.3 12.4*  HCT 45.5 43.0 39.3*  PLT 167 127* 122*    Coag's Recent Labs  Lab 01/06/18 1931  INR 1.36    Sepsis Markers Recent Labs  Lab 01/06/18 1931 01/07/18 0039 01/07/18 0601 01/07/18 0901 01/09/18 0626  LATICACIDVEN 6.7* 4.3* 4.7* 5.1*  --   PROCALCITON 0.44  --   --   --  27.48    ABG Recent Labs  Lab 01/07/18 0137 01/07/18 0517 01/07/18 0806  PHART 7.18* 7.01* 7.21*  PCO2ART 47 69* 114*  PO2ART 39* 61* PENDING    Liver Enzymes Recent Labs  Lab 01/06/18 1931  AST 32  ALT 14*  ALKPHOS 49  BILITOT 0.6  ALBUMIN 3.2*    Cardiac Enzymes Recent Labs  Lab 01/07/18 0039 01/07/18 0653 01/07/18 1239  TROPONINI 0.03* 0.03* 0.03*    Glucose Recent Labs  Lab 01/08/18 1942 01/09/18 0013 01/09/18 0357  01/09/18 0747 01/09/18 1146 01/09/18 1558  GLUCAP 145* 136* 134* 158* 123* 154*    CXR: No new film   ASSESSMENT / PLAN:  PULMONARY A: Acute hypoxemic respiratory failure HCAP, NOS -possible aspiration ARDS P:   Cont full vent support -continue lung protection strategy.  Decreasing PEEP Cont vent bundle Daily SBT if/when meets criteria  CARDIOVASCULAR A:  Septic shock, improving Sinus tachycardia due to fever and severe illness Paroxysmal atrial fibrillation with RVR-converted with amiodarone 04/11 P:  MAP goal > 65 mmHg Continue norepinephrine, phenylephrine, vasopressin Wean norepinephrine first due to AFRVR Continue amiodarone infusion Continue empiric hydrocortisone Echocardiogram ordered  RENAL A:   AKI, nonoliguric.  Creatinine improving P:   Monitor BMET intermittently Monitor I/Os Correct electrolytes as indicated   GASTROINTESTINAL A:   Abdominal distention, improved  P:   SUP: IV PPI Initiate TF protocol but keep rate at trickle  HEMATOLOGIC A:   Mild thrombocytopenia Leukopenia, mildly improved 04/10 P:  DVT px: SQ heparin Monitor CBC intermittently Transfuse per usual guidelines   INFECTIOUS A:   Severe sepsis Likely pneumonia Very high fevers Elevated PCT P:   Monitor temp, WBC count Micro and abx as above  Continue cooling blanket  ENDOCRINE A:   Type 2 diabetes Mild steroid and stress induced hyperglycemia P:   Initiate SSI, moderate scale  NEUROLOGIC A:   Ventilator dyssynchrony, resolved ICU/ventilator associated discomfort History of schizophrenia History of TBI P:   RASS goal: -2, -3 Continue fentanyl and midazolam infusions Discontinue cisatracurium 04/11   FAMILY  - Updates: No family at bedside today  CCM time:45 mins The above time includes time spent in consultation with patient and/or family members and reviewing care plan on multidisciplinary rounds  Billy Fischeravid Montey Ebel, MD PCCM service Mobile  [phone] Pager [phone]    01/09/2018, 6:11 PM

## 2018-01-09 NOTE — Progress Notes (Signed)
[facility] [place] [facility]   PATIENT NAME: Tommy Mathews    MR#:  [id number]  DATE OF BIRTH:  October 22, 1960  SUBJECTIVE:  CHIEF COMPLAINT:   Chief Complaint  Patient presents with  . Respiratory Distress   The patient is on ventilation and 3 maximum doses of pressor drips still hypotensive and tachycardic REVIEW OF SYSTEMS:  Review of Systems  Unable to perform ROS: Intubated    DRUG ALLERGIES:  Not on File VITALS:  Blood pressure 108/87, pulse 97, temperature 99.5 F (37.5 C), temperature source Core, resp. rate (!) 30, height 5\' 10"  (1.778 m), weight 118.4 kg (261 lb 0.4 oz), SpO2 96 %. PHYSICAL EXAMINATION:  Physical Exam  HENT:  Head: Normocephalic.  Eyes: No scleral icterus.  Bilateral pupils are pinpoint, not reactive to light.  Neck: No JVD present. No tracheal deviation present.  Cardiovascular: Regular rhythm and normal heart sounds. Exam reveals no gallop.  No murmur heard. Pulmonary/Chest:  Bilateral crackles  Abdominal: Soft. He exhibits no distension.  Hypoactive bowel sounds  Musculoskeletal: He exhibits no edema or tenderness.  Neurological:  Unable to exam.  Skin: No rash noted. No erythema.   LABORATORY PANEL:  Male CBC Recent Labs  Lab 01/08/18 0424  WBC 1.5*  HGB 12.4*  HCT 39.3*  PLT 122*   ------------------------------------------------------------------------------------------------------------------ Chemistries  Recent Labs  Lab 01/06/18 1931  01/08/18 0424 01/09/18 0626  NA 136   < > 139  --   K 3.9   < > 3.5  --   CL 99*   < > 103  --   CO2 19*   < > 26  --   GLUCOSE 149*   < > 161*  --   BUN 57*   < > 50*  --   CREATININE 3.46*   < > 2.21* 1.73*  CALCIUM 8.6*   < > 6.0*  --   AST 32  --   --   --   ALT 14*  --   --   --   ALKPHOS 49  --   --   --   BILITOT 0.6  --   --   --    < > = values in this interval not displayed.   RADIOLOGY:  No results found. ASSESSMENT AND PLAN:   Severe  sepsis with septic shock,  due to his pneumonia and UTI Continue 3 pressors epinephrine, vasopressin and Levophed drips, maximum dose is now, continue broad-spectrum antibiotics cefepime, vanc, follow-up cultures.   Lactic acidosis.  Lactic acid very elevated, aggressive IV fluid resuscitation Pro-calcitonin at 27.48  Neutropenia.  2/2 sepsis.  Follow-up CBC.  GI bleed hold Xarelto, PPI drip,  PRBC transfusion as needed  patient was on methotrexate which is on hold GI is following Hemoglobin stable at 12.4 today    Acute respiratory failure with hypoxia (HCC) -intubated and mechanically ventilated, likely due to his pneumonia and a possible aspiration event   Multifocal pneumonia -IV antibiotics as above, intubated as above, supportive treatment PRN    AKI (acute kidney injury), from severe ATN and sepsis  continue aggressive IV fluids as above, follow-up BMP.  Creatinine 2.1 --1.73 today Nephrology is following    Ileus (HCC) -patient has OG tube in place to suction    Diabetes (HCC) -sliding scale insulin with corresponding glucose checks  All the records are reviewed and case discussed with Care Management/Social Worker. Management plans discussed with the intensivist.  No family members  at bedside  CODE STATUS: Full Code  TOTAL TIME TAKING CARE OF THIS PATIENT: 38 minutes.   More than 50% of the time was spent in counseling/coordination of care: YES  POSSIBLE D/C IN ? DAYS, DEPENDING ON CLINICAL CONDITION.   Ramonita LabAruna Cahlil Sattar M.D on 01/09/2018 at 1:03 PM  Between 7am to 6pm - Pager - [phone]  After 6pm go to [email or web address] - Therapist, nutritionalpassword EPAS [facility]  [facility]

## 2018-01-09 NOTE — Progress Notes (Signed)
`   Pharmacy Antibiotic Note  Tommy Mathews is a 57 y.o. male admitted on 01/06/2018 with worsening respiratory failure requiring mechanical ventialtion. Patient with presumed aspiration event and positive MRSA PCR. Patient transitioned from Unasyn to Cefepime/Metronidazole during am rounds on 4/10. Pharmacy has been consulted for Cefepime and Vancomycin dosing.  Plan: Continue cefepime 2g IV Q12hr. Metronidazole added to cover for possible anaerobes in setting of possible aspiration pneumonia.   Continue vancomycin 1000mg  IV Q12hr for goal trough of 15-20. Patient's renal function continues to improve trough scheduled for 4/11 at 1700.   Height: 5\' 10"  (177.8 cm) Weight: 261 lb 0.4 oz (118.4 kg) IBW/kg (Calculated) : 73  Temp (24hrs), Avg:100.1 F (37.8 C), Min:98.1 F (36.7 C), Max:102.1 F (38.9 C)  Recent Labs  Lab 01/06/18 1931 01/06/18 2113 01/07/18 0039 01/07/18 0047 01/07/18 0601 01/07/18 0901 01/08/18 0424 01/09/18 0626  WBC 8.3  --   --   --  0.8*  --  1.5*  --   CREATININE 3.46* 2.60*  --  3.06*  --   --  2.21* 1.73*  LATICACIDVEN 6.7*  --  4.3*  --  4.7* 5.1*  --   --   VANCORANDOM  --   --   --   --   --   --  11  --     Estimated Creatinine Clearance: 62.2 mL/min (A) (by C-G formula based on SCr of 1.73 mg/dL (H)).    Not on File  Antimicrobials this admission: Cefepime 4/8 x 1 Vancomycin 4/8 >>  Unasyn 4/9 >> 4/10 Cefepime 4/10 >>  Metronidazole 4/10 >>  Dose adjustments this admission: 4/10 Vancomycin changed to 1g IV Q12hr.   Microbiology results: 4/8 BCx x 1:no growth x 3 days 4/9 BCx x 1: no growth x 2 days  4/8 UCx: 10K Staph Species  4/9 Tracheal Aspirate: normal flora  4/8 MRSA PCR: positive   Thank you for allowing pharmacy to be a part of this patient's care.  Renuka Farfan L 01/09/2018 3:16 PM

## 2018-01-09 NOTE — Progress Notes (Signed)
Nutrition Follow-up  DOCUMENTATION CODES:   Obesity unspecified  INTERVENTION:  Patient has been started on Vital High Protein at 20 mL/hr + Pro-Stat 30 mL BID. Per order comments maintain at 20 mL/hr for 24 hours before advancement.   On 4/12 if patient is tolerating trickle rate recommend advancing to goal regimen of Vital High Protein at 45 mL/hr (1080 mL goal daily volume) + Pro-Stat 60 mL BID via OGT. Provides 1480 kcal, 155 grams of protein, 907 mL H2O daily.  Also recommend liquid MVI daily.  NUTRITION DIAGNOSIS:   Inadequate oral intake related to inability to eat as evidenced by NPO status.  Ongoing.  GOAL:   Provide needs based on ASPEN/SCCM guidelines  Progressing with initiation of trickle feeds.  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  Patient intubated and sedated. No bowel movement this admission. Per RN documentation abdomen was still distended as of 0418 this AM.  Access: 18 Fr. OGT placed 4/8; terminates in distal stomach per CT abd/pelvis on 4/8; 60 cm at corner of mouth  MAP: 78-92 mmHg; remains on 3 pressors but rates slowly decreasing  TF: pt has now been ordered for Vital High Protein at trickle rate of 20 mL/hr + Pro-Stat 30 mL BID; per instructions on tube feed order maintain at 20 mL/hr for 24 hours before advancement  Patient is currently intubated on ventilator support MV: 11.6 L/min Temp (24hrs), Avg:100 F (37.8 C), Min:98.1 F (36.7 C), Max:102.1 F (38.9 C)  Propofol:  N/A  Medications reviewed and include: Solu-Cortef 50 mg Q6hrs IV, Novolog 0-15 units Q4hrs, pantoprazole, amiodarone, cefepime, fentanyl gtt, Flagyl, Versed gtt, norepinephrine gtt at 12 mcg/min, phenylephrine gtt at 300 mcg/min, vancomycin, vasopressin gtt at 0.03 units/min.  Labs reviewed: CBG 123-158 past 24 hrs, Creatinine 1.73 (trending down), eGFR 49 (trending up).  I/O: 1275 mL UOP yesterday (0.4 mL/kg/hr) - decrease from UOP the day before  Weight trend: 118.4 kg on 4/11; +11 kg from admission  Discussed with RN and on rounds.  Diet Order:  No diet orders on file  EDUCATION NEEDS:   Not appropriate for education at this time  Skin:  Skin Assessment: Reviewed RN Assessment  Last BM:  Unknown/PTA  Height:   Ht Readings from Last 1 Encounters:  01/07/18 '5\' 10"'  (1.778 m)    Weight:   Wt Readings from Last 1 Encounters:  01/09/18 261 lb 0.4 oz (118.4 kg)    Ideal Body Weight:  75.5 kg  BMI:  Body mass index is 37.45 kg/m.  Estimated Nutritional Needs:   Kcal:  1607-3710 (11-14 kcal/kg)  Protein:  >/= 151 grams (>/= 2 grams/kg IBW)  Fluid:  1.8 L/day  Willey Blade, MS, RD, LDN Office: [phone] Pager: [phone] After Hours/Weekend Pager: [phone]

## 2018-01-10 ENCOUNTER — Inpatient Hospital Stay (HOSPITAL_COMMUNITY)
Admit: 2018-01-10 | Discharge: 2018-01-10 | Disposition: A | Discharge status: Home / Self Care | Payer: Medicaid Other | Attending: Pulmonary Disease | Admitting: Pulmonary Disease

## 2018-01-10 ENCOUNTER — Inpatient Hospital Stay: Payer: Medicaid Other

## 2018-01-10 DIAGNOSIS — I340 Nonrheumatic mitral (valve) insufficiency: Secondary | ICD-10-CM

## 2018-01-10 DIAGNOSIS — J181 Lobar pneumonia, unspecified organism: Secondary | ICD-10-CM

## 2018-01-10 DIAGNOSIS — J80 Acute respiratory distress syndrome: Secondary | ICD-10-CM

## 2018-01-10 DIAGNOSIS — N179 Acute kidney failure, unspecified: Secondary | ICD-10-CM

## 2018-01-10 LAB — GLUCOSE, CAPILLARY
Glucose-Capillary: 166 mg/dL — ABNORMAL HIGH (ref 65–99)
Glucose-Capillary: 195 mg/dL — ABNORMAL HIGH (ref 65–99)
Glucose-Capillary: 160 mg/dL — ABNORMAL HIGH (ref 65–99)
Glucose-Capillary: 203 mg/dL — ABNORMAL HIGH (ref 65–99)
Glucose-Capillary: 148 mg/dL — ABNORMAL HIGH (ref 65–99)

## 2018-01-10 LAB — CBC
HEMATOCRIT: 35.4 % — AB (ref 40.0–52.0)
HEMOGLOBIN: 11.6 g/dL — AB (ref 13.0–18.0)
MCH: 27.6 pg (ref 26.0–34.0)
MCHC: 32.7 g/dL (ref 32.0–36.0)
MCV: 84.4 fL (ref 80.0–100.0)
Platelets: 98 10*3/uL — ABNORMAL LOW (ref 150–440)
RBC: 4.2 MIL/uL — ABNORMAL LOW (ref 4.40–5.90)
RDW: 18.5 % — ABNORMAL HIGH (ref 11.5–14.5)
WBC: 5.3 10*3/uL (ref 3.8–10.6)

## 2018-01-10 LAB — ECHOCARDIOGRAM COMPLETE
HEIGHTINCHES: 70 in
Weight: 4176.39 oz

## 2018-01-10 LAB — COMPREHENSIVE METABOLIC PANEL
ALK PHOS: 52 U/L (ref 38–126)
ALT: 28 U/L (ref 17–63)
ANION GAP: 8 (ref 5–15)
AST: 45 U/L — ABNORMAL HIGH (ref 15–41)
Albumin: 1.9 g/dL — ABNORMAL LOW (ref 3.5–5.0)
BILIRUBIN TOTAL: 0.6 mg/dL (ref 0.3–1.2)
BUN: 53 mg/dL — ABNORMAL HIGH (ref 6–20)
CALCIUM: 5.6 mg/dL — AB (ref 8.9–10.3)
CO2: 24 mmol/L (ref 22–32)
CREATININE: 1.77 mg/dL — AB (ref 0.61–1.24)
Chloride: 107 mmol/L (ref 101–111)
GFR, EST AFRICAN AMERICAN: 48 mL/min — AB (ref >60)
GFR, EST NON AFRICAN AMERICAN: 42 mL/min — AB (ref >60)
Glucose, Bld: 206 mg/dL — ABNORMAL HIGH (ref 65–99)
Potassium: 3.7 mmol/L (ref 3.5–5.1)
Sodium: 139 mmol/L (ref 135–145)
TOTAL PROTEIN: 5.4 g/dL — AB (ref 6.5–8.1)

## 2018-01-10 LAB — PROCALCITONIN: Procalcitonin: 16.68 ng/mL

## 2018-01-10 LAB — MAGNESIUM: MAGNESIUM: 1.5 mg/dL — AB (ref 1.7–2.4)

## 2018-01-10 LAB — PHOSPHORUS: Phosphorus: 2.5 mg/dL (ref 2.5–4.6)

## 2018-01-10 MED ORDER — ALTEPLASE 2 MG IJ SOLR: 2.0000 mg | Freq: Once | INTRAMUSCULAR | Status: DC

## 2018-01-10 MED ORDER — STERILE WATER FOR INJECTION IJ SOLN
INTRAMUSCULAR | Status: AC
  Filled 2018-01-10: qty 10

## 2018-01-10 MED ORDER — ALTEPLASE 2 MG IJ SOLR
2.0000 mg | Freq: Once | INTRAMUSCULAR | Status: AC | PRN
  Administered 2018-01-10: 2 mg
  Filled 2018-01-10: qty 2

## 2018-01-10 MED ORDER — SODIUM CHLORIDE 0.9 % IV SOLN
2.0000 g | Freq: Two times a day (BID) | INTRAVENOUS | Status: AC
  Administered 2018-01-10 – 2018-01-13 (×8): 2 g via INTRAVENOUS
  Filled 2018-01-10 (×8): qty 2

## 2018-01-10 MED ORDER — AMIODARONE IV BOLUS ONLY 150 MG/100ML
150.0000 mg | Freq: Once | INTRAVENOUS | Status: AC
  Administered 2018-01-10: 150 mg via INTRAVENOUS

## 2018-01-10 MED ORDER — SODIUM CHLORIDE 0.9 % IV SOLN
0.5000 mg/kg/h | INTRAVENOUS | Status: AC
  Administered 2018-01-10: 0.5 mg/kg/h via INTRAVENOUS
  Filled 2018-01-10 (×2): qty 100

## 2018-01-10 MED ORDER — MAGNESIUM SULFATE 4 GM/100ML IV SOLN
4.0000 g | Freq: Once | INTRAVENOUS | Status: AC
  Administered 2018-01-10: 4 g via INTRAVENOUS
  Filled 2018-01-10: qty 100

## 2018-01-10 NOTE — Progress Notes (Signed)
Good day. Moved arms and legs spontaneously during sedation vacation. Remained on high doses of vasopressors and Amiodarone. Became very agitated when he had stool. Moved arms and legs spontaneously and tried to move leg off of bed. Adjusted sedation to prevent self extubation.

## 2018-01-10 NOTE — Progress Notes (Signed)
PULMONARY / CRITICAL CARE MEDICINE   Name: Tommy Mathews MRN: [id number] DOB: [birth year]    ADMISSION DATE:  01/06/2018   PT PROFILE:  26 M SNF resident due to psychiatric illness and history of TBI admitted with severe bilateral pneumonia, severe sepsis/septic shock.  Intubated in the ED shortly after arrival.  Was also noted to have dark coffee-ground material suctioned from stomach.  Patient chronically on rivaroxaban.   MAJOR EVENTS/TEST RESULTS: 04/08 Admission as above 04/08 CT chest: Multifocal severe pneumonia with RUL collapse and soft tissue obstructing RUL bronchus. 04/08 CTAP: Mild small bowel ileus, potential enteritis. Nasogastric tube terminates in distal stomach. Cholelithiasis without CT findings of acute cholecystitis.Consider bronchoscopy.  04/09 nephrology consultation: No acute indication for dialysis at present. 04/09 gastroenterology consultation: Recommend high-dose PPI therapy. 04/10 severely hypoxemic despite 100% FiO2.  Recruitment maneuver with significant improvement.  PEEP increased and ARDS strategy on ventilator implemented.  Neuromuscular blockade implemented due to ventilator dyssynchrony. 04/11 PAF with RVR > converted with amiodarone. Gas exchange improving. Off Nimbex  INDWELLING DEVICES:: ETT 04/08 >>  L femoral CVL 04/08 >>   MICRO DATA: MRSA PCR 04/08 >> POS Urine 04/08 >> 10k coag negative staph Resp 04/09 >> c/w NOF Blood 04/08 >>  Blood 04/09 >>   ANTIMICROBIALS:  Unasyn 04/08 >> 04/10 Vanc 04/08 >>  Cefepime 04/10 >>  Metronidazole 04/10 >>     SUBJECTIVE:  Off Nimbex. RASS -4, -5, not F/C.  Synchronous with ventilator  VITAL SIGNS: BP 98/78   Pulse 87   Temp 98.3 F (36.8 C)   Resp (!) 25   Ht 5\' 10"  (1.778 m)   Wt 261 lb 0.4 oz (118.4 kg)   SpO2 94%   BMI 37.45 kg/m   HEMODYNAMICS:    VENTILATOR SETTINGS: Vent Mode: PRVC FiO2 (%):  [40 %-50 %] 50 % Set Rate:  [12 bmp-30 bmp] 30 bmp Vt Set:  [400 mL] 400 mL PEEP:   [10 cmH20-12 cmH20] 10 cmH20 Plateau Pressure:  [24 cmH20-29 cmH20] 24 cmH20  INTAKE / OUTPUT: I/O last 3 completed shifts: In: 7186.5 [I.V.:5638.2; NG/GT:398.3; IV Piggyback:1150] Out: 1325 [Urine:1275; Emesis/NG output:50]  PHYSICAL EXAMINATION: General: RASS -4, -5.  Not F/C, synchronous with ventilator Neuro: PERRLA, EOMI, DTRs symmetric, no spontaneous movement HEENT: NCAT, sclerae white Cardiovascular: Tachy, regular, no M Lungs: bilateral rhonchi anteriorly, no wheezes Abdomen: Mildly distended, bowel sounds present Extremities: Warm, 1+ edema Skin: No lesions noted  LABS:  BMET Recent Labs  Lab 01/07/18 0047 01/08/18 0424 01/09/18 0626 01/10/18 0747  NA 137 139  --  139  K 4.0 3.5  --  3.7  CL 106 103  --  107  CO2 21* 26  --  24  BUN 58* 50*  --  53*  CREATININE 3.06* 2.21* 1.73* 1.77*  GLUCOSE 118* 161*  --  206*    Electrolytes Recent Labs  Lab 01/07/18 0047 01/08/18 0424 01/10/18 0747  CALCIUM 6.9* 6.0* 5.6*  MG  --   --  1.5*  PHOS  --   --  2.5    CBC Recent Labs  Lab 01/07/18 0601 01/08/18 0424 01/10/18 0747  WBC 0.8* 1.5* 5.3  HGB 13.3 12.4* 11.6*  HCT 43.0 39.3* 35.4*  PLT 127* 122* 98*    Coag's Recent Labs  Lab 01/06/18 1931  INR 1.36    Sepsis Markers Recent Labs  Lab 01/06/18 1931 01/07/18 0039 01/07/18 0601 01/07/18 0901 01/09/18 0626 01/10/18 0747  LATICACIDVEN 6.7* 4.3* 4.7* 5.1*  --   --  PROCALCITON 0.44  --   --   --  27.48 16.68    ABG Recent Labs  Lab 01/07/18 0137 01/07/18 0517 01/07/18 0806  PHART 7.18* 7.01* 7.21*  PCO2ART 47 69* 114*  PO2ART 39* 61* PENDING    Liver Enzymes Recent Labs  Lab 01/06/18 1931 01/10/18 0747  AST 32 45*  ALT 14* 28  ALKPHOS 49 52  BILITOT 0.6 0.6  ALBUMIN 3.2* 1.9*    Cardiac Enzymes Recent Labs  Lab 01/07/18 0039 01/07/18 0653 01/07/18 1239  TROPONINI 0.03* 0.03* 0.03*    Glucose Recent Labs  Lab 01/09/18 1558 01/09/18 2012 01/09/18 2351  01/10/18 0416 01/10/18 0714 01/10/18 1108  GLUCAP 154* 156* 174* 166* 195* 160*    CXR: No new film   ASSESSMENT / PLAN:  PULMONARY A: Acute hypoxemic respiratory failure HCAP, NOS -possible aspiration ARDS P:   Cont full vent support -continue lung protection strategy.  Continue on 10 of PEEP Cont vent bundle Daily SBT if/when meets criteria  CARDIOVASCULAR A:  Septic shock, improving Sinus tachycardia due to fever and severe illness Paroxysmal atrial fibrillation with RVR-converted with amiodarone 04/11 P:  MAP goal > 65 mmHg Continue norepinephrine, phenylephrine, vasopressin Wean norepinephrine first due to AFRVR Continue amiodarone infusion Continue empiric hydrocortisone Echocardiogram-very  poor LVF, EF 20-25%  RENAL A:   AKI, oliguric.  Creatinine improving P:   Monitor BMET intermittently Monitor I/Os Correct electrolytes as indicated   GASTROINTESTINAL A:   Abdominal distention, improved  P:   SUP: IV PPI Initiate TF protocol but keep rate at trickle  HEMATOLOGIC A:   Mild thrombocytopenia Leukopenia, mildly improved 04/10 P:  DVT px: SQ heparin Monitor CBC intermittently Transfuse per usual guidelines   INFECTIOUS A:   Severe sepsis Likely pneumonia Still intermittent pyrexia Elevated PCT P:   Monitor temp, WBC count Micro and abx as above  Continue cooling blanket  ENDOCRINE A:   Type 2 diabetes Mild steroid and stress induced hyperglycemia P:   Initiate SSI, moderate scale  NEUROLOGIC A:   Ventilator dyssynchrony, resolved ICU/ventilator associated discomfort History of schizophrenia History of TBI P:   RASS goal: -2, -3 Continue fentanyl and midazolam infusions Discontinue cisatracurium 04/11   FAMILY  - Updates: Mother and sisters updated  CCM time:45 mins The above time includes time spent in consultation with patient and/or family members and reviewing care plan on multidisciplinary rounds  Jackson LatinoKarol Darcel Zick,  MD PCCM service Pager [phone]    01/10/2018, 1:07 PM

## 2018-01-10 NOTE — Progress Notes (Signed)
[place], [place] 01/10/18  Subjective:  Patient seen at bedside. He remains critically ill. Urine output was 750 cc over the preceding 24 hours.    Objective:  Vital signs in last 24 hours:  Temp:  [98.3 F (36.8 C)-100.6 F (38.1 C)] 98.3 F (36.8 C) (04/12 0818) Pulse Rate:  [71-101] 87 (04/12 0900) Resp:  [25-30] 25 (04/12 0900) BP: (55-124)/(21-92) 98/78 (04/12 0900) SpO2:  [92 %-98 %] 94 % (04/12 1155) FiO2 (%):  [40 %-50 %] 50 % (04/12 1155)  Weight change:  Filed Weights   01/07/18 0142 01/08/18 0333 01/09/18 0500  Weight: 107.4 kg (236 lb 12.4 oz) 116.5 kg (256 lb 13.4 oz) 118.4 kg (261 lb 0.4 oz)    Intake/Output:    Intake/Output Summary (Last 24 hours) at 01/10/2018 1341 Last data filed at 01/10/2018 0801 Gross per 24 hour  Intake 4685.51 ml  Output 450 ml  Net 4235.51 ml     Physical Exam: General:  Critically ill-appearing  HEENT  ET tube  Neck  no masses  Pulm/lungs  ventilator assisted, bilateral rhonchi  CVS/Heart  S1S2 no rubs  Abdomen:   Distended  Extremities: + Edema  Neurologic:  Sedated and paralyzed  Skin:   extremities cool to touch, no rash  GU:  Foley catheter in place       Basic Metabolic Panel:  Recent Labs  Lab 01/06/18 1931 01/06/18 2113 01/07/18 0047 01/08/18 0424 01/09/18 0626 01/10/18 0747  NA 136  --  137 139  --  139  K 3.9  --  4.0 3.5  --  3.7  CL 99*  --  106 103  --  107  CO2 19*  --  21* 26  --  24  GLUCOSE 149*  --  118* 161*  --  206*  BUN 57*  --  58* 50*  --  53*  CREATININE 3.46* 2.60* 3.06* 2.21* 1.73* 1.77*  CALCIUM 8.6*  --  6.9* 6.0*  --  5.6*  MG  --   --   --   --   --  1.5*  PHOS  --   --   --   --   --  2.5     CBC: Recent Labs  Lab 01/06/18 1931 01/07/18 0601 01/08/18 0424 01/10/18 0747  WBC 8.3 0.8* 1.5* 5.3  NEUTROABS 6.0  --   --   --   HGB 14.5 13.3 12.4* 11.6*  HCT 45.5 43.0 39.3* 35.4*  MCV 85.5 88.1 85.7 84.4  PLT 167 127* 122* 98*     No results found for: HEPBSAG, HEPBSAB, HEPBIGM    Microbiology:  Recent Results (from the past 240 hour(s))  Blood Culture (routine x 2)     Status: None (Preliminary result)   Collection Time: 01/06/18  7:44 PM  Result Value Ref Range Status   Specimen Description BLOOD BLOOD LEFT WRIST  Final   Special Requests   Final    BOTTLES DRAWN AEROBIC AND ANAEROBIC Blood Culture adequate volume   Culture   Final    NO GROWTH 4 DAYS Performed at [facility], [address], [place], [place] [address]    Report Status PENDING  Incomplete  Urine culture     Status: Abnormal   Collection Time: 01/06/18  7:44 PM  Result Value Ref Range Status   Specimen Description   Final    URINE, RANDOM Performed at [facility], [address] [place], [place]  [address]    Special Requests   Final    NONE Performed at [facility], [place], [place] [address]    Culture (A)  Final    10,000 COLONIES/mL STAPHYLOCOCCUS SPECIES (COAGULASE NEGATIVE) CALL MICROBIOLOGY LAB IF SENSITIVITIES ARE REQUIRED. Performed at [facility], [place] [address], [place], [place] [address]    Report Status 01/08/2018 FINAL  Final  MRSA PCR Screening     Status: Abnormal   Collection Time: 01/06/18 11:51 PM  Result Value Ref Range Status   MRSA by PCR POSITIVE (A) NEGATIVE Final    Comment:        The GeneXpert MRSA Assay (FDA approved for NASAL specimens only), is one component of a comprehensive MRSA colonization surveillance program. It is not intended to diagnose MRSA infection nor to guide or monitor treatment for MRSA infections. RESULT CALLED TO, READ BACK BY AND VERIFIED WITH: BETH BUONO 01/07/18 @ 0121  [place]   Performed at [facility], [address] [place], [place] [address]   Blood Culture (routine x 2)     Status: None (Preliminary result)   Collection Time: 01/07/18  7:19 AM  Result Value Ref Range Status   Specimen Description BLOOD  RIGHT HAND  Final   Special Requests   Final    BOTTLES DRAWN AEROBIC AND ANAEROBIC Blood Culture results may not be optimal due to an inadequate volume of blood received in culture bottles   Culture   Final    NO GROWTH 3 DAYS Performed at [facility], [address] [place], [place] [address]    Report Status PENDING  Incomplete  Culture, respiratory (NON-Expectorated)     Status: None   Collection Time: 01/07/18 12:28 PM  Result Value Ref Range Status   Specimen Description   Final    TRACHEAL ASPIRATE Performed at [facility], [address] [place], [place] [address]    Special Requests   Final    Normal Performed at [facility], [address] [place], [place] [address]    Gram Stain   Final    MODERATE WBC PRESENT,BOTH PMN AND MONONUCLEAR RARE GRAM POSITIVE COCCI RARE YEAST    Culture   Final    Consistent with normal respiratory flora. Performed at [facility], [place] [address], [place], [place] [address]    Report Status 01/09/2018 FINAL  Final    Coagulation Studies: No results for input(s): LABPROT, INR in the last 72 hours.  Urinalysis: No results for input(s): COLORURINE, LABSPEC, PHURINE, GLUCOSEU, HGBUR, BILIRUBINUR, KETONESUR, PROTEINUR, UROBILINOGEN, NITRITE, LEUKOCYTESUR in the last 72 hours.  Invalid input(s): APPERANCEUR    Imaging: Dg Chest Port 1 View  Result Date: 01/10/2018 CLINICAL DATA:  Respiratory failure EXAM: PORTABLE CHEST 1 VIEW COMPARISON:  01/08/2018 FINDINGS: Cardiac shadow is stable. Endotracheal tube and nasogastric catheter are again noted in satisfactory position. Bilateral infiltrates are again identified and stable. No new focal abnormality is seen. No bony abnormality is noted. IMPRESSION: Stable bilateral infiltrates. Electronically Signed   By: Inez Catalina M.D.   On: 01/10/2018 07:24     Medications:   . sodium chloride 10 mL/hr at 01/10/18 0600  . amiodarone 60 mg/hr (01/10/18  1119)  . ceFEPime (MAXIPIME) IV Stopped (01/10/18 1155)  . fentaNYL 250 mcg/hr (01/10/18 1247)  . metronidazole 500 mg (01/10/18 1153)  . midazolam (VERSED) infusion 2 mg/hr (01/10/18 0600)  . norepinephrine (LEVOPHED) Adult infusion 10.027 mcg/min (01/10/18 0600)  .  phenylephrine (NEO-SYNEPHRINE) Adult infusion 300 mcg/min (01/10/18 1247)  . Vancomycin Stopped (01/09/18 2216)  . vasopressin (PITRESSIN) infusion - *FOR SHOCK* 0.03 Units/min (01/10/18 0600)   . artificial tears  1 application Both Eyes V8P  . chlorhexidine gluconate (MEDLINE KIT)  15 mL Mouth Rinse BID  . Chlorhexidine Gluconate Cloth  6 each Topical Q0600  . feeding supplement (PRO-STAT SUGAR FREE 64)  30 mL Per Tube BID  . feeding supplement (VITAL HIGH PROTEIN)  1,000 mL Per Tube Q24H  . heparin injection (subcutaneous)  5,000 Units Subcutaneous Q8H  . hydrocortisone sod succinate (SOLU-CORTEF) inj  50 mg Intravenous Q6H  . insulin aspart  0-15 Units Subcutaneous Q4H  . mouth rinse  15 mL Mouth Rinse 10 times per day  . mupirocin ointment  1 application Nasal BID  . pantoprazole sodium  40 mg Per Tube Daily  . sterile water (preservative free)       sodium chloride, acetaminophen, fentaNYL, midazolam, ondansetron (ZOFRAN) IV, vecuronium  Assessment/ Plan:  57 y.o. 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  Patient is a current smoker and is resident of assisted living facility  1.  Acute renal failure, likely severe ATN from concurrent illness and sepsis Baseline creatinine is unknown  Creatinine currently 1.7, urine output 750 cc over the preceding 24 hours.  No acute indication for dialysis at the moment.  Follow renal parameters daily.  2.  Acute respiratory failure Likely secondary to  severe pneumonia.  Continue ventilatory support.  3. Sepsis Requiring multiple pressors Management as per ICU team  4.  Hypocalcemia:  Start the patient on calcium gluconate drip.     LOS: [place] 4/12/20191:41 PM  [facility], [place]  Note: This note was prepared with Dragon dictation. Any transcription errors are unintentional

## 2018-01-10 NOTE — Progress Notes (Signed)
Nutrition Follow-up  DOCUMENTATION CODES:   Obesity unspecified  INTERVENTION:  Patient with increasing levo requirement. Continue Vital High protein at 4820mL/hr + Pro-stat 30mL BID  Monitor, if pressor need stabilizes or decreases, and MAP continues to stay >65 over the next 24 hours, recommend advance Vital High Protein to  8245mL/hr + Prostat 60mL BID via OGt. Provides 1480 calories, 155 grams of protein, 907mL H2O daily  Recommend liquid MVI daily Replete Mg (1.5)  NUTRITION DIAGNOSIS:   Inadequate oral intake related to inability to eat as evidenced by NPO status. -ongoing  GOAL:   Provide needs based on ASPEN/SCCM guidelines -progressing with tube feeds  MONITOR:   Vent status, Labs, Weight trends, TF tolerance, I & O's  REASON FOR ASSESSMENT:   Ventilator, Consult Enteral/tube feeding initiation and management  ASSESSMENT:   57 year old male with PMHx of DM type 2, HLD, HTN, GERD, schizophrenia, hx TBI in 2009 requiring prolonged hospitalization with ventilator support and tracheostomy who is now admitted from a facility with severe acute respiratory failure requiring intubation evening of 4/8, severe septic shock, bilateral PNA, acute renal failure.  Patient is currently intubated on ventilator support MV: 11.8 L/min Temp (24hrs), Avg:99.4 F (37.4 C), Min:98.3 F (36.8 C), Max:100.6 F (38.1 C) Propofol: None  Discussed in Rounds. Remains critically ill. 750mL UOP last 24 hrs 15L Fluid Positive  Labs reviewed:  BUN 53/Creatinine 1.77  Medications reviewed and include: Mg 1.5  Diet Order:  No diet orders on file  EDUCATION NEEDS:   Not appropriate for education at this time  Skin:  Skin Assessment: Reviewed RN Assessment  Last BM:  Unknown/PTA  Height:   Ht Readings from Last 1 Encounters:  01/07/18 5\' 10"  (1.778 m)    Weight:   Wt Readings from Last 1 Encounters:  01/09/18 261 lb 0.4 oz (118.4 kg)    Ideal Body Weight:  75.5  kg  BMI:  Body mass index is 37.45 kg/m.  Estimated Nutritional Needs:   Kcal:  1181-1504 (11-14 kcal/kg)  Protein:  >/= 151 grams (>/= 2 grams/kg IBW)  Fluid:  1.8 L/day  Dionne AnoWilliam M. Sartaj Hoskin, MS, RD LDN Inpatient Clinical Dietitian Pager [phone]

## 2018-01-10 NOTE — Progress Notes (Signed)
*  PRELIMINARY RESULTS* Echocardiogram 2D Echocardiogram has been performed.  Joanette GulaJoan M Pier Laux 01/10/2018, 10:19 AM

## 2018-01-10 NOTE — Progress Notes (Signed)
Pharmacy Electrolyte Monitoring Consult:  Pharmacy consulted to assist in monitoring and replacing electrolytes in this [age] y.o. male admitted on 01/06/2018 with Respiratory Distress  Patient currently requiring mechanical ventilation and multiple pressors.   Labs:  Sodium (mmol/L)  Date Value  01/10/2018 139   Potassium (mmol/L)  Date Value  01/10/2018 3.7   Magnesium (mg/dL)  Date Value  19/14/782904/08/2018 1.5 (L)   Phosphorus (mg/dL)  Date Value  56/21/308604/08/2018 2.5   Calcium (mg/dL)  Date Value  57/84/696204/08/2018 5.6 (LL)   Albumin (g/dL)  Date Value  95/28/413204/08/2018 1.9 (L)    Plan: Nephrology ordered calcium gluconate 0.5mg /kg to infuse over 24 hours. Corrected calcium 7.3.   Will replace magnesium 4g IV x 1.   Will recheck all electrolytes with am labs.   Raizel Wesolowski L 01/10/2018 7:14 PM

## 2018-01-10 NOTE — Progress Notes (Signed)
[employer] [facility] [place] at [facility]   PATIENT NAME: Tommy Mathews    MR#:  [id number]  DATE OF BIRTH:  August 15, 1961  SUBJECTIVE:  CHIEF COMPLAINT:   Chief Complaint  Patient presents with  . Respiratory Distress  Patient remains critically ill  REVIEW OF SYSTEMS:  CONSTITUTIONAL: No fever, fatigue or weakness.  EYES: No blurred or double vision.  EARS, NOSE, AND THROAT: No tinnitus or ear pain.  RESPIRATORY: No cough, shortness of breath, wheezing or hemoptysis.  CARDIOVASCULAR: No chest pain, orthopnea, edema.  GASTROINTESTINAL: No nausea, vomiting, diarrhea or abdominal pain.  GENITOURINARY: No dysuria, hematuria.  ENDOCRINE: No polyuria, nocturia,  HEMATOLOGY: No anemia, easy bruising or bleeding SKIN: No rash or lesion. MUSCULOSKELETAL: No joint pain or arthritis.   NEUROLOGIC: No tingling, numbness, weakness.  PSYCHIATRY: No anxiety or depression.   ROS  DRUG ALLERGIES:  Not on File  VITALS:  Blood pressure 98/78, pulse 87, temperature 98.3 F (36.8 C), resp. rate (!) 25, height 5\' 10"  (1.778 m), weight 118.4 kg (261 lb 0.4 oz), SpO2 94 %.  PHYSICAL EXAMINATION:  GENERAL:  57 y.o.-year-old patient lying in the bed with no acute distress.  EYES: Pupils equal, round, reactive to light and accommodation. No scleral icterus. Extraocular muscles intact.  HEENT: Head atraumatic, normocephalic. Oropharynx and nasopharynx clear.  NECK:  Supple, no jugular venous distention. No thyroid enlargement, no tenderness.  LUNGS: Normal breath sounds bilaterally, no wheezing, rales,rhonchi or crepitation. No use of accessory muscles of respiration.  CARDIOVASCULAR: S1, S2 normal. No murmurs, rubs, or gallops.  ABDOMEN: Soft, nontender, nondistended. Bowel sounds present. No organomegaly or mass.  EXTREMITIES: No pedal edema, cyanosis, or clubbing.  NEUROLOGIC: Cranial nerves II through XII are intact. Muscle strength 5/5 in all extremities. Sensation intact. Gait not  checked.  PSYCHIATRIC: The patient is alert and oriented x 3.  SKIN: No obvious rash, lesion, or ulcer.   Physical Exam LABORATORY PANEL:   CBC Recent Labs  Lab 01/10/18 0747  WBC 5.3  HGB 11.6*  HCT 35.4*  PLT 98*   ------------------------------------------------------------------------------------------------------------------  Chemistries  Recent Labs  Lab 01/10/18 0747  NA 139  K 3.7  CL 107  CO2 24  GLUCOSE 206*  BUN 53*  CREATININE 1.77*  CALCIUM 5.6*  MG 1.5*  AST 45*  ALT 28  ALKPHOS 52  BILITOT 0.6   ------------------------------------------------------------------------------------------------------------------  Cardiac Enzymes Recent Labs  Lab 01/07/18 0653 01/07/18 1239  TROPONINI 0.03* 0.03*   ------------------------------------------------------------------------------------------------------------------  RADIOLOGY:  Dg Chest Port 1 View  Result Date: 01/10/2018 CLINICAL DATA:  Respiratory failure EXAM: PORTABLE CHEST 1 VIEW COMPARISON:  01/08/2018 FINDINGS: Cardiac shadow is stable. Endotracheal tube and nasogastric catheter are again noted in satisfactory position. Bilateral infiltrates are again identified and stable. No new focal abnormality is seen. No bony abnormality is noted. IMPRESSION: Stable bilateral infiltrates. Electronically Signed   By: Tommy Mathews M.D.   On: 01/10/2018 07:24    ASSESSMENT AND PLAN:    Severe sepsis with septic shock,  due to his pneumonia and UTI Continue 3 pressors epinephrine, vasopressin and Levophed drips, maximum dose is now, continue broad-spectrum antibiotics cefepime, vanc, follow-up cultures.   Lactic acidosis.  Lactic acid very elevated, aggressive IV fluid resuscitation Pro-calcitonin at 27.48  Neutropenia.  2/2 sepsis.  Follow-up CBC.  GI bleed hold Xarelto, PPI drip,  PRBC transfusion as needed  patient was on methotrexate which is on hold GI is following Hemoglobin stable at 12.4  today  Acute  respiratory failure with hypoxia (HCC) -intubated and mechanically ventilated, likely due to his pneumonia and a possible aspiration event Multifocal pneumonia -IV antibiotics as above, intubated as above, supportive treatment PRN  AKI (acute kidney injury), from severe ATN and sepsis  continue aggressive IV fluids as above, follow-up BMP.  Creatinine 2.1 --1.73 today Nephrology is following  Ileus (HCC) -patient has OG tube in place to suction  Diabetes (HCC) -sliding scale insulin with corresponding glucose checks      All the records are reviewed and case discussed with Care Management/Social Workerr. Management plans discussed with the patient, family and they are in agreement.  CODE STATUS: full  TOTAL TIME TAKING CARE OF THIS PATIENT: 35 minutes.     POSSIBLE D/C IN 3-8 DAYS, DEPENDING ON CLINICAL CONDITION.   Tommy Mathews M.D on 01/10/2018   Between 7am to 6pm - Pager - [phone]  After 6pm go to [email or web address] - password [email or web address]  [facility] [place] [facility]  Office  [phone]  CC: Primary care physician; Patient, [name]  Note: This dictation was prepared with Dragon dictation along with smaller phrase technology. Any transcriptional errors that result from this process are unintentional.

## 2018-01-10 NOTE — Progress Notes (Signed)
`   Pharmacy Antibiotic Note  Tommy Mathews is a 57 y.o. male admitted on 01/06/2018 with worsening respiratory failure requiring mechanical ventialtion. Patient with presumed aspiration event and positive MRSA PCR. Patient transitioned from Unasyn to Cefepime/Metronidazole during am rounds on 4/10. Pharmacy has been consulted for Cefepime and Vancomycin dosing.  Plan: Continue cefepime 2g IV Q12hr. Metronidazole added to cover for possible anaerobes in setting of possible aspiration pneumonia.   Continue vancomycin 750mg  IV Q12hr for goal trough of 15-20. Patient's renal function continues to improve trough scheduled for 4/13 at 0930.    Height: 5\' 10"  (177.8 cm) Weight: 261 lb 0.4 oz (118.4 kg) IBW/kg (Calculated) : 73  Temp (24hrs), Avg:99.5 F (37.5 C), Min:98.3 F (36.8 C), Max:100.6 F (38.1 C)  Recent Labs  Lab 01/06/18 1931 01/06/18 2113 01/07/18 0039 01/07/18 0047 01/07/18 0601 01/07/18 0901 01/08/18 0424 01/09/18 0626 01/09/18 1841 01/10/18 0747  WBC 8.3  --   --   --  0.8*  --  1.5*  --   --  5.3  CREATININE 3.46* 2.60*  --  3.06*  --   --  2.21* 1.73*  --  1.77*  LATICACIDVEN 6.7*  --  4.3*  --  4.7* 5.1*  --   --   --   --   VANCOTROUGH  --   --   --   --   --   --   --   --  22*  --   VANCORANDOM  --   --   --   --   --   --  11  --   --   --     Estimated Creatinine Clearance: 60.8 mL/min (A) (by C-G formula based on SCr of 1.77 mg/dL (H)).    Not on File  Antimicrobials this admission: Cefepime 4/8 x 1 Vancomycin 4/8 >>  Unasyn 4/9 >> 4/10 Cefepime 4/10 >>  Metronidazole 4/10 >>  Dose adjustments this admission: 4/10 Vancomycin changed to 1g IV Q12hr.  4/11  Vanc t= 22.  Dose chg from 1 gm q12h to 750 q12h    Microbiology results: 4/8 BCx x 1:no growth x 4 days 4/9 BCx x 1: no growth x 3 days  4/8 UCx: 10K Staph Species  4/9 Tracheal Aspirate: normal flora  4/8 MRSA PCR: positive   Thank you for allowing pharmacy to be a part of this patient's  care.  Lynann Demetrius L 01/10/2018 5:41 PM

## 2018-01-11 ENCOUNTER — Inpatient Hospital Stay: Payer: Medicaid Other

## 2018-01-11 DIAGNOSIS — D696 Thrombocytopenia, unspecified: Secondary | ICD-10-CM

## 2018-01-11 LAB — GLUCOSE, CAPILLARY
GLUCOSE-CAPILLARY: 172 mg/dL — AB (ref 65–99)
GLUCOSE-CAPILLARY: 185 mg/dL — AB (ref 65–99)
GLUCOSE-CAPILLARY: 189 mg/dL — AB (ref 65–99)
GLUCOSE-CAPILLARY: 205 mg/dL — AB (ref 65–99)
GLUCOSE-CAPILLARY: 216 mg/dL — AB (ref 65–99)
Glucose-Capillary: 208 mg/dL — ABNORMAL HIGH (ref 65–99)

## 2018-01-11 LAB — RENAL FUNCTION PANEL
ALBUMIN: 1.7 g/dL — AB (ref 3.5–5.0)
Anion gap: 6 (ref 5–15)
BUN: 52 mg/dL — ABNORMAL HIGH (ref 6–20)
CALCIUM: 6.8 mg/dL — AB (ref 8.9–10.3)
CO2: 24 mmol/L (ref 22–32)
CREATININE: 1.38 mg/dL — AB (ref 0.61–1.24)
Chloride: 110 mmol/L (ref 101–111)
GFR calc Af Amer: >60 mL/min (ref >60)
GFR, EST NON AFRICAN AMERICAN: 56 mL/min — AB (ref >60)
Glucose, Bld: 213 mg/dL — ABNORMAL HIGH (ref 65–99)
PHOSPHORUS: 2 mg/dL — AB (ref 2.5–4.6)
Potassium: 3.4 mmol/L — ABNORMAL LOW (ref 3.5–5.1)
Sodium: 140 mmol/L (ref 135–145)

## 2018-01-11 LAB — BLOOD GAS, ARTERIAL
Acid-base deficit: 4.1 mmol/L — ABNORMAL HIGH (ref 0.0–2.0)
Bicarbonate: 21.6 mmol/L (ref 20.0–28.0)
FIO2: 0.5
LHR: 30 {breaths}/min
O2 Saturation: 94.2 %
PEEP: 10 cmH2O
PO2 ART: 77 mmHg — AB (ref 83.0–108.0)
Patient temperature: 37
VT: 400 mL
pCO2 arterial: 41 mmHg (ref 32.0–48.0)
pH, Arterial: 7.33 — ABNORMAL LOW (ref 7.350–7.450)

## 2018-01-11 LAB — CBC
HEMATOCRIT: 32 % — AB (ref 40.0–52.0)
HEMOGLOBIN: 10.7 g/dL — AB (ref 13.0–18.0)
MCH: 28.2 pg (ref 26.0–34.0)
MCHC: 33.3 g/dL (ref 32.0–36.0)
MCV: 84.7 fL (ref 80.0–100.0)
PLATELETS: 74 10*3/uL — AB (ref 150–440)
RBC: 3.78 MIL/uL — AB (ref 4.40–5.90)
RDW: 18 % — ABNORMAL HIGH (ref 11.5–14.5)
WBC: 3.9 10*3/uL (ref 3.8–10.6)

## 2018-01-11 LAB — PHOSPHORUS: PHOSPHORUS: 1.9 mg/dL — AB (ref 2.5–4.6)

## 2018-01-11 LAB — CULTURE, BLOOD (ROUTINE X 2)
Culture: NO GROWTH
SPECIAL REQUESTS: ADEQUATE

## 2018-01-11 LAB — MAGNESIUM: MAGNESIUM: 1.9 mg/dL (ref 1.7–2.4)

## 2018-01-11 LAB — VANCOMYCIN, TROUGH: VANCOMYCIN TR: 23 ug/mL — AB (ref 15–20)

## 2018-01-11 LAB — TRIGLYCERIDES: Triglycerides: 76 mg/dL (ref <150)

## 2018-01-11 MED ORDER — ALBUMIN HUMAN 25 % IV SOLN
25.0000 g | Freq: Once | INTRAVENOUS | Status: AC
  Administered 2018-01-11: 25 g via INTRAVENOUS
  Filled 2018-01-11: qty 100

## 2018-01-11 MED ORDER — VANCOMYCIN HCL IN DEXTROSE 1-5 GM/200ML-% IV SOLN
1000.0000 mg | INTRAVENOUS | Status: DC
  Administered 2018-01-12 – 2018-01-13 (×2): 1000 mg via INTRAVENOUS
  Filled 2018-01-11 (×2): qty 200

## 2018-01-11 MED ORDER — POTASSIUM PHOSPHATES 15 MMOLE/5ML IV SOLN
30.0000 mmol | Freq: Once | INTRAVENOUS | Status: AC
  Administered 2018-01-11: 30 mmol via INTRAVENOUS
  Filled 2018-01-11: qty 10

## 2018-01-11 MED ORDER — FUROSEMIDE 10 MG/ML IJ SOLN
40.0000 mg | Freq: Once | INTRAMUSCULAR | Status: AC
Start: 2018-01-11 — End: 2018-01-11
  Administered 2018-01-11: 40 mg via INTRAVENOUS
  Filled 2018-01-11: qty 4

## 2018-01-11 MED ORDER — POTASSIUM CHLORIDE 20 MEQ PO PACK
40.0000 meq | PACK | Freq: Once | ORAL | Status: AC
  Administered 2018-01-11: 40 meq via NASOGASTRIC
  Filled 2018-01-11: qty 2

## 2018-01-11 MED ORDER — PROPOFOL 1000 MG/100ML IV EMUL
5.0000 ug/kg/min | INTRAVENOUS | Status: DC
  Administered 2018-01-11: 40 ug/kg/min via INTRAVENOUS
  Administered 2018-01-11 (×2): 20 ug/kg/min via INTRAVENOUS
  Administered 2018-01-12: 40 ug/kg/min via INTRAVENOUS
  Administered 2018-01-12 (×5): 30 ug/kg/min via INTRAVENOUS
  Administered 2018-01-13 (×2): 40 ug/kg/min via INTRAVENOUS
  Filled 2018-01-11 (×11): qty 100

## 2018-01-11 NOTE — Progress Notes (Signed)
PULMONARY / CRITICAL CARE MEDICINE   Name: Tommy PangJames Meares MRN: [id number] DOB: 30-Aug-1961    ADMISSION DATE:  01/06/2018   PT PROFILE:  [age] M SNF resident due to psychiatric illness and history of TBI admitted with severe bilateral pneumonia, severe sepsis/septic shock.  Intubated in the ED shortly after arrival.  Was also noted to have dark coffee-ground material suctioned from stomach.  Patient chronically on rivaroxaban.   MAJOR EVENTS/TEST RESULTS: 04/08 Admission as above 04/08 CT chest: Multifocal severe pneumonia with RUL collapse and soft tissue obstructing RUL bronchus. 04/08 CTAP: Mild small bowel ileus, potential enteritis. Nasogastric tube terminates in distal stomach. Cholelithiasis without CT findings of acute cholecystitis.Consider bronchoscopy.  04/09 nephrology consultation: No acute indication for dialysis at present. 04/09 gastroenterology consultation: Recommend high-dose PPI therapy. 04/10 severely hypoxemic despite 100% FiO2.  Recruitment maneuver with significant improvement.  PEEP increased and ARDS strategy on ventilator implemented.  Neuromuscular blockade implemented due to ventilator dyssynchrony. 04/11 PAF with RVR > converted with amiodarone. Gas exchange improving. Off Nimbex  INDWELLING DEVICES:: ETT 04/08 >>  L femoral CVL 04/08 >>   MICRO DATA: MRSA PCR 04/08 >> POS Urine 04/08 >> 10k coag negative staph Resp 04/09 >> c/w NOF Blood 04/08 >>  Blood 04/09 >>   ANTIMICROBIALS:  Unasyn 04/08 >> 04/10 Vanc 04/08 >>  Cefepime 04/10 >>  Metronidazole 04/10 >>     SUBJECTIVE:  Off Nimbex. RASS -4, -5, not F/C.  Synchronous with ventilator  VITAL SIGNS: BP 98/78   Pulse 78   Temp 99.3 F (37.4 C)   Resp (!) 30   Ht 5\' 10"  (1.778 m)   Wt 261 lb 0.4 oz (118.4 kg)   SpO2 100%   BMI 37.45 kg/m   HEMODYNAMICS:    VENTILATOR SETTINGS: Vent Mode: PRVC FiO2 (%):  [50 %] 50 % Set Rate:  [30 bmp] 30 bmp Vt Set:  [400 mL] 400 mL PEEP:  [10 cmH20]  10 cmH20 Plateau Pressure:  [22 cmH20-27 cmH20] 27 cmH20  INTAKE / OUTPUT: I/O last 3 completed shifts: In: 10265.3 [I.V.:7857; NG/GT:1058.3; IV Piggyback:1350] Out: 2510 [Urine:2510]  PHYSICAL EXAMINATION: General: RASS -4, -5.  Not F/C, synchronous with ventilator Neuro: PERRLA, EOMI, DTRs symmetric, no spontaneous movement HEENT: NCAT, sclerae white Cardiovascular: Tachy, regular, no M Lungs: bilateral rhonchi anteriorly, no wheezes Abdomen: Mildly distended, bowel sounds present Extremities: Warm, 2+ edema Skin: No lesions noted  LABS:  BMET Recent Labs  Lab 01/08/18 0424 01/09/18 0626 01/10/18 0747 01/11/18 0356  NA 139  --  139 140  K 3.5  --  3.7 3.4*  CL 103  --  107 110  CO2 26  --  24 24  BUN 50*  --  53* 52*  CREATININE 2.21* 1.73* 1.77* 1.38*  GLUCOSE 161*  --  206* 213*    Electrolytes Recent Labs  Lab 01/08/18 0424 01/10/18 0747 01/11/18 0356  CALCIUM 6.0* 5.6* 6.8*  MG  --  1.5* 1.9  PHOS  --  2.5 1.9*  2.0*    CBC Recent Labs  Lab 01/08/18 0424 01/10/18 0747 01/11/18 0358  WBC 1.5* 5.3 3.9  HGB 12.4* 11.6* 10.7*  HCT 39.3* 35.4* 32.0*  PLT 122* 98* 74*    Coag's Recent Labs  Lab 01/06/18 1931  INR 1.36    Sepsis Markers Recent Labs  Lab 01/06/18 1931 01/07/18 0039 01/07/18 0601 01/07/18 0901 01/09/18 0626 01/10/18 0747  LATICACIDVEN 6.7* 4.3* 4.7* 5.1*  --   --  PROCALCITON 0.44  --   --   --  27.48 16.68    ABG Recent Labs  Lab 01/07/18 0517 01/07/18 0806 01/11/18 0500  PHART 7.01* 7.21* 7.33*  PCO2ART 69* 114* 41  PO2ART 61* PENDING 77*    Liver Enzymes Recent Labs  Lab 01/06/18 1931 01/10/18 0747 01/11/18 0356  AST 32 45*  --   ALT 14* 28  --   ALKPHOS 49 52  --   BILITOT 0.6 0.6  --   ALBUMIN 3.2* 1.9* 1.7*    Cardiac Enzymes Recent Labs  Lab 01/07/18 0039 01/07/18 0653 01/07/18 1239  TROPONINI 0.03* 0.03* 0.03*    Glucose Recent Labs  Lab 01/10/18 1949 01/11/18 0035  01/11/18 0350 01/11/18 0719 01/11/18 1111 01/11/18 1605  GLUCAP 148* 185* 172* 208* 205* 216*    CXR: No new film   ASSESSMENT / PLAN:  PULMONARY A: Acute hypoxemic respiratory failure HCAP, NOS -possible aspiration ARDS P:   Cont full vent support -continue lung protection strategy.  Continue on 10 of PEEP Cont vent bundle Daily SBT if/when meets criteria  CARDIOVASCULAR A:  Septic shock, improving Sinus tachycardia due to fever and severe illness Paroxysmal atrial fibrillation with RVR-converted with amiodarone 04/11 P:  MAP goal > 65 mmHg Continue norepinephrine, phenylephrine, vasopressin Wean norepinephrine first due to AFRVR Continue amiodarone infusion Continue empiric hydrocortisone Echocardiogram-very  poor LVF, EF 20-25%  RENAL A:   AKI, oliguric.  Creatinine improving P:   Monitor BMET intermittently Monitor I/Os Correct electrolytes as indicated   GASTROINTESTINAL A:   Abdominal distention, improved  P:   SUP: IV PPI Initiate TF protocol but keep rate at trickle  HEMATOLOGIC A:    Thrombocytopenia Leukopenia P:  DVT px: Will D/C SQ heparin if Plt fall below 70 Monitor CBC intermittently Transfuse per usual guidelines   INFECTIOUS A:   Severe sepsis Likely pneumonia Apyrexial today Elevated PCT P:   Monitor temp, WBC count Micro and abx as above  Continue cooling blanket  ENDOCRINE A:   Type 2 diabetes Mild steroid and stress induced hyperglycemia P:   Initiate SSI, moderate scale  NEUROLOGIC A:   Ventilator dyssynchrony, resolved ICU/ventilator associated discomfort Agitation today even on Fentanyl and Versed drips History of schizophrenia History of TBI P:   RASS goal: -2, -3 Continue fentanyl, D/C versed, start propofol Discontinue cisatracurium 04/11   FAMILY  - Updates: Mother and sister updated  CCM time:45 mins The above time includes time spent in consultation with patient and/or family members and  reviewing care plan on multidisciplinary rounds  Jackson Latino, MD PCCM service Pager [phone]    01/11/2018, 6:13 PM

## 2018-01-11 NOTE — Progress Notes (Signed)
[facility], [place] 01/11/18  Subjective:  Urine output increased significantly over the preceding 24 hours. Creatinine currently down to 1.3. Urine output was 2.2 L over the preceding 24 hours.    Objective:  Vital signs in last 24 hours:  Temp:  [98.2 F (36.8 C)-100 F (37.8 C)] 99 F (37.2 C) (04/13 0900) Pulse Rate:  [81-129] 86 (04/13 0900) Resp:  [16-30] 30 (04/13 0900) BP: (87-121)/(63-87) 91/72 (04/13 0900) SpO2:  [88 %-100 %] 96 % (04/13 0900) FiO2 (%):  [50 %] 50 % (04/13 0815)  Weight change:  Filed Weights   01/07/18 0142 01/08/18 0333 01/09/18 0500  Weight: 107.4 kg (236 lb 12.4 oz) 116.5 kg (256 lb 13.4 oz) 118.4 kg (261 lb 0.4 oz)    Intake/Output:    Intake/Output Summary (Last 24 hours) at 01/11/2018 1051 Last data filed at 01/11/2018 0825 Gross per 24 hour  Intake 5579.79 ml  Output 2335 ml  Net 3244.79 ml     Physical Exam: General:  Critically ill-appearing  HEENT  ET tube/OGT in palce  Neck  supple  Pulm/lungs  ventilator assisted, bilateral rhonchi  CVS/Heart  S1S2 no rubs  Abdomen:   Distended  Extremities: + Edema  Neurologic:  Sedated  Skin:   Dry, no rashes  GU:  Foley catheter in place       Basic Metabolic Panel:  Recent Labs  Lab 01/06/18 1931  01/07/18 0047 01/08/18 0424 01/09/18 0626 01/10/18 0747 01/11/18 0356  NA 136  --  137 139  --  139 140  K 3.9  --  4.0 3.5  --  3.7 3.4*  CL 99*  --  106 103  --  107 110  CO2 19*  --  21* 26  --  24 24  GLUCOSE 149*  --  118* 161*  --  206* 213*  BUN 57*  --  58* 50*  --  53* 52*  CREATININE 3.46*   < > 3.06* 2.21* 1.73* 1.77* 1.38*  CALCIUM 8.6*  --  6.9* 6.0*  --  5.6* 6.8*  MG  --   --   --   --   --  1.5* 1.9  PHOS  --   --   --   --   --  2.5 1.9*  2.0*   < > = values in this interval not displayed.     CBC: Recent Labs  Lab 01/06/18 1931 01/07/18 0601 01/08/18 0424 01/10/18 0747 01/11/18 0358  WBC 8.3 0.8* 1.5* 5.3 3.9   NEUTROABS 6.0  --   --   --   --   HGB 14.5 13.3 12.4* 11.6* 10.7*  HCT 45.5 43.0 39.3* 35.4* 32.0*  MCV 85.5 88.1 85.7 84.4 84.7  PLT 167 127* 122* 98* 74*     No results found for: HEPBSAG, HEPBSAB, HEPBIGM    Microbiology:  Recent Results (from the past 240 hour(s))  Blood Culture (routine x 2)     Status: None   Collection Time: 01/06/18  7:44 PM  Result Value Ref Range Status   Specimen Description BLOOD BLOOD LEFT WRIST  Final   Special Requests   Final    BOTTLES DRAWN AEROBIC AND ANAEROBIC Blood Culture adequate volume   Culture   Final    NO GROWTH 5 DAYS Performed at [facility], [address] [place], [place] [address]    Report Status 01/11/2018 FINAL  Final  Urine culture     Status:  Abnormal   Collection Time: 01/06/18  7:44 PM  Result Value Ref Range Status   Specimen Description   Final    URINE, RANDOM Performed at [facility], [address], [place], [place] [address]    Special Requests   Final    NONE Performed at [facility], [address] [place], [place] [address]    Culture (A)  Final    10,000 COLONIES/mL STAPHYLOCOCCUS SPECIES (COAGULASE NEGATIVE) CALL MICROBIOLOGY LAB IF SENSITIVITIES ARE REQUIRED. Performed at [facility], [place] [address], [place], [place] [address]    Report Status 01/08/2018 FINAL  Final  MRSA PCR Screening     Status: Abnormal   Collection Time: 01/06/18 11:51 PM  Result Value Ref Range Status   MRSA by PCR POSITIVE (A) NEGATIVE Final    Comment:        The GeneXpert MRSA Assay (FDA approved for NASAL specimens only), is one component of a comprehensive MRSA colonization surveillance program. It is not intended to diagnose MRSA infection nor to guide or monitor treatment for MRSA infections. RESULT CALLED TO, READ BACK BY AND VERIFIED WITH: BETH BUONO 01/07/18 @ 0121  [place]   Performed at [facility], [address] [place], [place] [address]   Blood  Culture (routine x 2)     Status: None (Preliminary result)   Collection Time: 01/07/18  7:19 AM  Result Value Ref Range Status   Specimen Description BLOOD RIGHT HAND  Final   Special Requests   Final    BOTTLES DRAWN AEROBIC AND ANAEROBIC Blood Culture results may not be optimal due to an inadequate volume of blood received in culture bottles   Culture   Final    NO GROWTH 4 DAYS Performed at [facility], [address] [place], [place] [address]    Report Status PENDING  Incomplete  Culture, respiratory (NON-Expectorated)     Status: None   Collection Time: 01/07/18 12:28 PM  Result Value Ref Range Status   Specimen Description   Final    TRACHEAL ASPIRATE Performed at [facility], [address] [place], [place] [address]    Special Requests   Final    Normal Performed at [facility], [address] [place], [place] [address]    Gram Stain   Final    MODERATE WBC PRESENT,BOTH PMN AND MONONUCLEAR RARE GRAM POSITIVE COCCI RARE YEAST    Culture   Final    Consistent with normal respiratory flora. Performed at [facility], [place] [address], [place], [place] [address]    Report Status 01/09/2018 FINAL  Final    Coagulation Studies: No results for input(s): LABPROT, INR in the last 72 hours.  Urinalysis: No results for input(s): COLORURINE, LABSPEC, PHURINE, GLUCOSEU, HGBUR, BILIRUBINUR, KETONESUR, PROTEINUR, UROBILINOGEN, NITRITE, LEUKOCYTESUR in the last 72 hours.  Invalid input(s): APPERANCEUR    Imaging: Dg Chest Port 1 View  Result Date: 01/11/2018 CLINICAL DATA:  Respiratory failure EXAM: PORTABLE CHEST 1 VIEW COMPARISON:  January 10, 2018 FINDINGS: The ETT terminates 2.8 cm above the carina. No pneumothorax. The cardiomediastinal silhouette is stable. Pulmonary edema has decreased but persists. No other changes. IMPRESSION: 1. Stable support apparatus. 2. Persistent but decreased pulmonary edema. 3. No other change.  Electronically Signed   By: Dorise Bullion III M.D   On: 01/11/2018 07:01   Dg Chest Port 1 View  Result Date: 01/10/2018 CLINICAL DATA:  Respiratory failure EXAM: PORTABLE CHEST 1  VIEW COMPARISON:  01/08/2018 FINDINGS: Cardiac shadow is stable. Endotracheal tube and nasogastric catheter are again noted in satisfactory position. Bilateral infiltrates are again identified and stable. No new focal abnormality is seen. No bony abnormality is noted. IMPRESSION: Stable bilateral infiltrates. Electronically Signed   By: Inez Catalina M.D.   On: 01/10/2018 07:24     Medications:   . sodium chloride 10 mL/hr at 01/11/18 0600  . amiodarone 60 mg/hr (01/11/18 0600)  . calcium gluconate infusion(930 mg elemental calcium/L) 0.5 mg elemental calcium/kg/hr (01/11/18 0600)  . ceFEPime (MAXIPIME) IV 2 g (01/11/18 1046)  . fentaNYL 350 mcg/hr (01/11/18 0600)  . metronidazole Stopped (01/11/18 0302)  . midazolam (VERSED) infusion 4 mg/hr (01/11/18 0600)  . norepinephrine (LEVOPHED) Adult infusion Stopped (01/10/18 2209)  . phenylephrine (NEO-SYNEPHRINE) Adult infusion 40 mcg/min (01/11/18 0600)  . potassium PHOSPHATE IVPB (mmol) 30 mmol (01/11/18 0758)  . propofol (DIPRIVAN) infusion 40 mcg/kg/min (01/11/18 1045)  . Vancomycin Stopped (01/10/18 2208)  . vasopressin (PITRESSIN) infusion - *FOR SHOCK* 0.03 Units/min (01/11/18 0600)   . artificial tears  1 application Both Eyes C6C  . chlorhexidine gluconate (MEDLINE KIT)  15 mL Mouth Rinse BID  . Chlorhexidine Gluconate Cloth  6 each Topical Q0600  . feeding supplement (PRO-STAT SUGAR FREE 64)  30 mL Per Tube BID  . feeding supplement (VITAL HIGH PROTEIN)  1,000 mL Per Tube Q24H  . heparin injection (subcutaneous)  5,000 Units Subcutaneous Q8H  . hydrocortisone sod succinate (SOLU-CORTEF) inj  50 mg Intravenous Q6H  . insulin aspart  0-15 Units Subcutaneous Q4H  . mouth rinse  15 mL Mouth Rinse 10 times per day  . mupirocin ointment  1 application Nasal  BID  . pantoprazole sodium  40 mg Per Tube Daily   sodium chloride, acetaminophen, fentaNYL, midazolam, ondansetron (ZOFRAN) IV, vecuronium  Assessment/ Plan:  57 y.o. 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  Patient is a current smoker and is resident of assisted living facility  1.  Acute renal failure, likely severe ATN from concurrent illness and sepsis Baseline creatinine is unknown  -Renal function continues to improve.  Creatinine down to 1.38.  Good urine output at 2.2 L over the preceding 24 hours.  2.  Acute respiratory failure And remains on the ventilator.  Continue ventilatory support.  3. Sepsis Requiring multiple pressors Management as per ICU team  4.  Hypocalcemia: Serum calcium improved with calcium drip.  Maintain the patient on this for now.   LOS: [place] 4/13/201910:51 [place], [place]  Note: This note was prepared with Dragon dictation. Any transcription errors are unintentional

## 2018-01-11 NOTE — Progress Notes (Signed)
`   Pharmacy Antibiotic Note  Tommy Mathews is a 57 y.o. male admitted on 01/06/2018 with worsening respiratory failure requiring mechanical ventialtion. Patient with presumed aspiration event and positive MRSA PCR. Patient transitioned from Unasyn to Cefepime/Metronidazole during am rounds on 4/10. Pharmacy has been consulted for Cefepime and Vancomycin dosing.  Plan: Continue cefepime 2g IV Q12hr. Metronidazole added to cover for possible anaerobes in setting of possible aspiration pneumonia.   Adjust vancomycin to 1gm IV Q24H due to supratherapeutic trough despite improving renal function. Will check trough prior to second dose, not at steady state. Continue follow renal function.    Height: 5\' 10"  (177.8 cm) Weight: 261 lb 0.4 oz (118.4 kg) IBW/kg (Calculated) : 73  Temp (24hrs), Avg:99.8 F (37.7 C), Min:98.9 F (37.2 C), Max:100 F (37.8 C)  Recent Labs  Lab 01/06/18 1931  01/07/18 0039 01/07/18 0047 01/07/18 0601 01/07/18 0901 01/08/18 0424 01/09/18 0626 01/09/18 1841 01/10/18 0747 01/11/18 0356 01/11/18 0358 01/11/18 1107  WBC 8.3  --   --   --  0.8*  --  1.5*  --   --  5.3  --  3.9  --   CREATININE 3.46*   < >  --  3.06*  --   --  2.21* 1.73*  --  1.77* 1.38*  --   --   LATICACIDVEN 6.7*  --  4.3*  --  4.7* 5.1*  --   --   --   --   --   --   --   VANCOTROUGH  --   --   --   --   --   --   --   --  22*  --   --   --  8323*  VANCORANDOM  --   --   --   --   --   --  11  --   --   --   --   --   --    < > = values in this interval not displayed.    Estimated Creatinine Clearance: 78 mL/min (A) (by C-G formula based on SCr of 1.38 mg/dL (H)).    Not on File  Antimicrobials this admission: Cefepime 4/8 x 1 Vancomycin 4/8 >>  Unasyn 4/9 >> 4/10 Cefepime 4/10 >>  Metronidazole 4/10 >>  Dose adjustments this admission: 4/10 Vancomycin changed to 1g IV Q12hr. 4/11  Vanc t= 22.  Dose chg from 1 gm q12h to 750 q12h   4/13 Vanc trough: 23; Dose changed to 1gm Q24H (est  Cm: 3315mcg/ml)  Microbiology results: 4/8 BCx x 1:no growth x 4 days 4/9 BCx x 1: no growth x 3 days  4/8 UCx: 10K Staph Species  4/9 Tracheal Aspirate: normal flora  4/8 MRSA PCR: positive   Thank you for allowing pharmacy to be a part of this patient's care.  Sicily Zaragoza C 01/11/2018 12:35 PM

## 2018-01-11 NOTE — Progress Notes (Signed)
[facility] [place] [facility]   PATIENT NAME: Tommy Mathews    MR#:  [id number]  DATE OF BIRTH:  03-12-1961  SUBJECTIVE:  CHIEF COMPLAINT:   Chief Complaint  Patient presents with  . Respiratory Distress  Remains critically ill  REVIEW OF SYSTEMS:  CONSTITUTIONAL: No fever, fatigue or weakness.  EYES: No blurred or double vision.  EARS, NOSE, AND THROAT: No tinnitus or ear pain.  RESPIRATORY: No cough, shortness of breath, wheezing or hemoptysis.  CARDIOVASCULAR: No chest pain, orthopnea, edema.  GASTROINTESTINAL: No nausea, vomiting, diarrhea or abdominal pain.  GENITOURINARY: No dysuria, hematuria.  ENDOCRINE: No polyuria, nocturia,  HEMATOLOGY: No anemia, easy bruising or bleeding SKIN: No rash or lesion. MUSCULOSKELETAL: No joint pain or arthritis.   NEUROLOGIC: No tingling, numbness, weakness.  PSYCHIATRY: No anxiety or depression.   ROS  DRUG ALLERGIES:  Not on File  VITALS:  Blood pressure 91/72, pulse 86, temperature 99 F (37.2 C), resp. rate (!) 30, height 5\' 10"  (1.778 m), weight 118.4 kg (261 lb 0.4 oz), SpO2 96 %.  PHYSICAL EXAMINATION:  GENERAL:  57 y.o.-year-old patient lying in the bed with no acute distress.  EYES: Pupils equal, round, reactive to light and accommodation. No scleral icterus. Extraocular muscles intact.  HEENT: Head atraumatic, normocephalic. Oropharynx and nasopharynx clear.  NECK:  Supple, no jugular venous distention. No thyroid enlargement, no tenderness.  LUNGS: Normal breath sounds bilaterally, no wheezing, rales,rhonchi or crepitation. No use of accessory muscles of respiration.  CARDIOVASCULAR: S1, S2 normal. No murmurs, rubs, or gallops.  ABDOMEN: Soft, nontender, nondistended. Bowel sounds present. No organomegaly or mass.  EXTREMITIES: No pedal edema, cyanosis, or clubbing.  NEUROLOGIC: Cranial nerves II through XII are intact. Muscle strength 5/5 in all extremities. Sensation intact. Gait not checked.   PSYCHIATRIC: The patient is alert and oriented x 3.  SKIN: No obvious rash, lesion, or ulcer.   Physical Exam LABORATORY PANEL:   CBC Recent Labs  Lab 01/11/18 0358  WBC 3.9  HGB 10.7*  HCT 32.0*  PLT 74*   ------------------------------------------------------------------------------------------------------------------  Chemistries  Recent Labs  Lab 01/10/18 0747 01/11/18 0356  NA 139 140  K 3.7 3.4*  CL 107 110  CO2 24 24  GLUCOSE 206* 213*  BUN 53* 52*  CREATININE 1.77* 1.38*  CALCIUM 5.6* 6.8*  MG 1.5* 1.9  AST 45*  --   ALT 28  --   ALKPHOS 52  --   BILITOT 0.6  --    ------------------------------------------------------------------------------------------------------------------  Cardiac Enzymes Recent Labs  Lab 01/07/18 0653 01/07/18 1239  TROPONINI 0.03* 0.03*   ------------------------------------------------------------------------------------------------------------------  RADIOLOGY:  Dg Chest Port 1 View  Result Date: 01/11/2018 CLINICAL DATA:  Respiratory failure EXAM: PORTABLE CHEST 1 VIEW COMPARISON:  January 10, 2018 FINDINGS: The ETT terminates 2.8 cm above the carina. No pneumothorax. The cardiomediastinal silhouette is stable. Pulmonary edema has decreased but persists. No other changes. IMPRESSION: 1. Stable support apparatus. 2. Persistent but decreased pulmonary edema. 3. No other change. Electronically Signed   By: Gerome Samavid  Williams III M.D   On: 01/11/2018 07:01   Dg Chest Port 1 View  Result Date: 01/10/2018 CLINICAL DATA:  Respiratory failure EXAM: PORTABLE CHEST 1 VIEW COMPARISON:  01/08/2018 FINDINGS: Cardiac shadow is stable. Endotracheal tube and nasogastric catheter are again noted in satisfactory position. Bilateral infiltrates are again identified and stable. No new focal abnormality is seen. No bony abnormality is noted. IMPRESSION: Stable bilateral infiltrates. Electronically Signed   By: Loraine LericheMark  Lukens M.D.   On: 01/10/2018 07:24     ASSESSMENT AND PLAN:  1 acute severe septic shock Due to his pneumonia and UTI Continue empiric cefepime/vancomycin/Flagyl, vasopressor support with epinephrine, vasopressin and Levophed drips, IV fluids for rehydration, and follow-up on cultures  Pro-calcitonin 27.48  2 acute Neutropenia Due to sepsis  3 GI bleed Stable Continue to hold Xarelto, continue PPI, methotrexate on hold, GI is following  4 acute respiratory failure with hypoxia Stable secondary to sepsis and pneumonia Continue mechanical ventilation with weaning as tolerated  5AKI  Resolving, creatinine 1.3 Due to ATN and sepsis Nephrology input appreciated  6 chronic diabetes mellitus type 2 Stable Continue sliding scale insulin with Accu-Cheks per routine  7 acute ileus Resolving Exacerbated by sepsis  All the records are reviewed and case discussed with Care Management/Social Workerr. Management plans discussed with the patient, family and they are in agreement.  CODE STATUS: full  TOTAL TIME TAKING CARE OF THIS PATIENT: 35 minutes.     POSSIBLE D/C IN 3-5 DAYS, DEPENDING ON CLINICAL CONDITION.   Evelena Asa Gunda Maqueda M.D on 01/11/2018   Between 7am to 6pm - Pager - [phone]  After 6pm go to [email or web address] - password [email or web address]  [facility]  Office  [phone]  CC: Primary care physician; Patient, [name]  Note: This dictation was prepared with Dragon dictation along with smaller phrase technology. Any transcriptional errors that result from this process are unintentional.

## 2018-01-11 NOTE — Progress Notes (Signed)
Eventful day. Weaned FIO2 of ventilator to 45%. Did not tolerate lower changes to ventilator oxygen levels.Changed from  Remains on Neo. at 40 mcgs until 1630 when B/P dropped see note. Output marginal today. Family in and out.

## 2018-01-11 NOTE — Progress Notes (Signed)
1630 B/P trending down. All IV lines checked. IV lines flushed with saline. 1700 B/P still low. B/P cuff switched to right arm. Still remains low. 1705 Titrating Neo to increase B/P. No change in heart rate, mrespiratory rate or sedation level. Diprivan and Fentanyl rates decreased. B/P slowly increasing.

## 2018-01-11 NOTE — Progress Notes (Signed)
Pharmacy Electrolyte Monitoring Consult:  Pharmacy consulted to assist in monitoring and replacing electrolytes in this 57 y.o. male admitted on 01/06/2018 with Respiratory Distress  Patient currently requiring mechanical ventilation and multiple pressors.   Labs:  Sodium (mmol/L)  Date Value  01/11/2018 140   Potassium (mmol/L)  Date Value  01/11/2018 3.4 (L)   Magnesium (mg/dL)  Date Value  09/81/191404/13/2019 1.9   Phosphorus (mg/dL)  Date Value  78/29/562104/13/2019 1.9 (L)  01/11/2018 2.0 (L)   Calcium (mg/dL)  Date Value  30/86/578404/13/2019 6.8 (L)   Albumin (g/dL)  Date Value  69/62/952804/13/2019 1.7 (L)   Corrected Calcium: 8.6  Plan: Potassium and Phos repleated this morning with KCl 40mEq PO and KPhos 30mmol IV.   No additional supplementation at this time, will recheck electrolytes with AM labs.   Anquanette Bahner C 01/11/2018 1:33 PM

## 2018-01-12 ENCOUNTER — Inpatient Hospital Stay: Payer: Medicaid Other

## 2018-01-12 LAB — CBC
HCT: 30.4 % — ABNORMAL LOW (ref 40.0–52.0)
Hemoglobin: 10.1 g/dL — ABNORMAL LOW (ref 13.0–18.0)
MCH: 28 pg (ref 26.0–34.0)
MCHC: 33.1 g/dL (ref 32.0–36.0)
MCV: 84.6 fL (ref 80.0–100.0)
PLATELETS: 69 10*3/uL — AB (ref 150–440)
RBC: 3.59 MIL/uL — AB (ref 4.40–5.90)
RDW: 18.2 % — ABNORMAL HIGH (ref 11.5–14.5)
WBC: 3.4 10*3/uL — ABNORMAL LOW (ref 3.8–10.6)

## 2018-01-12 LAB — BASIC METABOLIC PANEL
Anion gap: 6 (ref 5–15)
BUN: 63 mg/dL — AB (ref 6–20)
CALCIUM: 7.1 mg/dL — AB (ref 8.9–10.3)
CHLORIDE: 110 mmol/L (ref 101–111)
CO2: 25 mmol/L (ref 22–32)
CREATININE: 1.4 mg/dL — AB (ref 0.61–1.24)
GFR calc Af Amer: >60 mL/min (ref >60)
GFR calc non Af Amer: 55 mL/min — ABNORMAL LOW (ref >60)
Glucose, Bld: 225 mg/dL — ABNORMAL HIGH (ref 65–99)
Potassium: 3.8 mmol/L (ref 3.5–5.1)
SODIUM: 141 mmol/L (ref 135–145)

## 2018-01-12 LAB — GLUCOSE, CAPILLARY
GLUCOSE-CAPILLARY: 191 mg/dL — AB (ref 65–99)
GLUCOSE-CAPILLARY: 200 mg/dL — AB (ref 65–99)
GLUCOSE-CAPILLARY: 207 mg/dL — AB (ref 65–99)
Glucose-Capillary: 230 mg/dL — ABNORMAL HIGH (ref 65–99)
Glucose-Capillary: 188 mg/dL — ABNORMAL HIGH (ref 65–99)
Glucose-Capillary: 191 mg/dL — ABNORMAL HIGH (ref 65–99)

## 2018-01-12 LAB — BLOOD GAS, ARTERIAL
ACID-BASE DEFICIT: 3.1 mmol/L — AB (ref 0.0–2.0)
Bicarbonate: 23.6 mmol/L (ref 20.0–28.0)
Drawn by: 28459
FIO2: 50
Mechanical Rate: 30
O2 SAT: 94.8 %
PATIENT TEMPERATURE: 37
PCO2 ART: 49 mmHg — AB (ref 32.0–48.0)
PEEP/CPAP: 10 cmH2O
PH ART: 7.29 — AB (ref 7.350–7.450)
PO2 ART: 83 mmHg (ref 83.0–108.0)
VT: 400 mL

## 2018-01-12 LAB — PHOSPHORUS: PHOSPHORUS: 2.9 mg/dL (ref 2.5–4.6)

## 2018-01-12 LAB — CULTURE, BLOOD (ROUTINE X 2): Culture: NO GROWTH

## 2018-01-12 LAB — MAGNESIUM: MAGNESIUM: 1.8 mg/dL (ref 1.7–2.4)

## 2018-01-12 MED ORDER — STERILE WATER FOR INJECTION IJ SOLN
INTRAMUSCULAR | Status: AC
  Administered 2018-01-12: 10 mL
  Filled 2018-01-12: qty 10

## 2018-01-12 MED ORDER — ALBUMIN HUMAN 5 % IV SOLN
12.5000 g | Freq: Once | INTRAVENOUS | Status: AC
  Administered 2018-01-12: 12.5 g via INTRAVENOUS
  Filled 2018-01-12: qty 250

## 2018-01-12 MED ORDER — POTASSIUM CHLORIDE 20 MEQ PO PACK
40.0000 meq | PACK | Freq: Once | ORAL | Status: AC
  Administered 2018-01-12: 40 meq via ORAL
  Filled 2018-01-12: qty 2

## 2018-01-12 MED ORDER — INSULIN GLARGINE 100 UNIT/ML ~~LOC~~ SOLN
15.0000 [IU] | Freq: Every day | SUBCUTANEOUS | Status: DC
  Administered 2018-01-12 – 2018-01-18 (×7): 15 [IU] via SUBCUTANEOUS
  Filled 2018-01-12 (×9): qty 0.15

## 2018-01-12 MED ORDER — MIDAZOLAM HCL 2 MG/2ML IJ SOLN
4.0000 mg | Freq: Once | INTRAMUSCULAR | Status: AC
  Administered 2018-01-12: 4 mg via INTRAVENOUS
  Filled 2018-01-12: qty 4

## 2018-01-12 MED ORDER — FUROSEMIDE 10 MG/ML IJ SOLN
60.0000 mg | Freq: Once | INTRAMUSCULAR | Status: AC
  Administered 2018-01-12: 60 mg via INTRAVENOUS
  Filled 2018-01-12: qty 6

## 2018-01-12 MED ORDER — MAGNESIUM SULFATE 2 GM/50ML IV SOLN
2.0000 g | Freq: Once | INTRAVENOUS | Status: AC
  Administered 2018-01-12: 2 g via INTRAVENOUS
  Filled 2018-01-12: qty 50

## 2018-01-12 MED ORDER — FENTANYL CITRATE (PF) 100 MCG/2ML IJ SOLN
100.0000 ug | INTRAMUSCULAR | Status: DC | PRN
  Administered 2018-01-12: 100 ug via INTRAVENOUS
  Filled 2018-01-12 (×3): qty 2

## 2018-01-12 MED ORDER — IPRATROPIUM-ALBUTEROL 0.5-2.5 (3) MG/3ML IN SOLN
3.0000 mL | RESPIRATORY_TRACT | Status: DC
  Administered 2018-01-12 – 2018-01-20 (×51): 3 mL via RESPIRATORY_TRACT
  Filled 2018-01-12 (×51): qty 3

## 2018-01-12 MED ORDER — FUROSEMIDE 10 MG/ML IJ SOLN
40.0000 mg | Freq: Once | INTRAMUSCULAR | Status: AC
  Administered 2018-01-12: 40 mg via INTRAVENOUS
  Filled 2018-01-12: qty 4

## 2018-01-12 NOTE — Progress Notes (Signed)
PULMONARY / CRITICAL CARE MEDICINE   Name: Tommy Mathews MRN: [id number] DOB: Apr 28, 1961    ADMISSION DATE:  01/06/2018   PT PROFILE:  [age] M SNF resident due to psychiatric illness and history of TBI admitted with severe bilateral pneumonia, severe sepsis/septic shock.  Intubated in the ED shortly after arrival.  Was also noted to have dark coffee-ground material suctioned from stomach.  Patient chronically on rivaroxaban.   MAJOR EVENTS/TEST RESULTS: 04/08 Admission as above 04/08 CT chest: Multifocal severe pneumonia with RUL collapse and soft tissue obstructing RUL bronchus. 04/08 CTAP: Mild small bowel ileus, potential enteritis. Nasogastric tube terminates in distal stomach. Cholelithiasis without CT findings of acute cholecystitis.Consider bronchoscopy.  04/09 nephrology consultation: No acute indication for dialysis at present. 04/09 gastroenterology consultation: Recommend high-dose PPI therapy. 04/10 severely hypoxemic despite 100% FiO2.  Recruitment maneuver with significant improvement.  PEEP increased and ARDS strategy on ventilator implemented.  Neuromuscular blockade implemented due to ventilator dyssynchrony. 04/11 PAF with RVR > converted with amiodarone. Gas exchange improving. Off Nimbex 04/13 High airway pressure with vent dyssynchony; given Vecuronium; FIO2 increased to 50%  4/13 Amiodarone D/C because of SB  INDWELLING DEVICES:: ETT 04/08 >>  L femoral CVL 04/08 >>   MICRO DATA: MRSA PCR 04/08 >> POS Urine 04/08 >> 10k coag negative staph Resp 04/09 >> c/w NOF Blood 04/08 >>  Blood 04/09 >>   ANTIMICROBIALS:  Unasyn 04/08 >> 04/10 Vanc 04/08 >>  Cefepime 04/10 >>  Metronidazole 04/10 >>     SUBJECTIVE:  Off Nimbex. RASS -4, -5, not F/C.  Synchronous with ventilator  VITAL SIGNS: BP 96/70 (BP Location: Right Arm)   Pulse (!) 59   Temp (!) 97.5 F (36.4 C)   Resp (!) 26   Ht 5\' 10"  (1.778 m)   Wt 261 lb 0.4 oz (118.4 kg)   SpO2 97%   BMI 37.45 kg/m    HEMODYNAMICS:   Dyssynchrony with vent in the night 4/13- received Vecuronium   VENTILATOR SETTINGS: Vent Mode: PRVC FiO2 (%):  [45 %-100 %] 50 % Set Rate:  [30 bmp] 30 bmp Vt Set:  [400 mL] 400 mL PEEP:  [5 cmH20-10 cmH20] 5 cmH20 Plateau Pressure:  [27 cmH20] 27 cmH20  INTAKE / OUTPUT: I/O last 3 completed shifts: In: 5019.6 [I.V.:3403.2; NG/GT:666.3; IV Piggyback:950] Out: 2860 [Urine:2860]  PHYSICAL EXAMINATION: General: RASS -4, -5. Sedated, synchronous with ventilator Neuro: PERRLA, EOMI, DTRs symmetric, no spontaneous movement HEENT: PERRL, sclerae white Cardiovascular: RRR, no M Lungs: bilateral rhonchi anteriorly, no wheezes Abdomen: Mildly distended, bowel sounds present Extremities: Warm, 2+ edema upper > lower Skin: No lesions noted  LABS:  BMET Recent Labs  Lab 01/10/18 0747 01/11/18 0356 01/12/18 0438  NA 139 140 141  K 3.7 3.4* 3.8  CL 107 110 110  CO2 24 24 25   BUN 53* 52* 63*  CREATININE 1.77* 1.38* 1.40*  GLUCOSE 206* 213* 225*    Electrolytes Recent Labs  Lab 01/10/18 0747 01/11/18 0356 01/12/18 0438  CALCIUM 5.6* 6.8* 7.1*  MG 1.5* 1.9 1.8  PHOS 2.5 1.9*  2.0* 2.9    CBC Recent Labs  Lab 01/10/18 0747 01/11/18 0358 01/12/18 0438  WBC 5.3 3.9 3.4*  HGB 11.6* 10.7* 10.1*  HCT 35.4* 32.0* 30.4*  PLT 98* 74* 69*    Coag's Recent Labs  Lab 01/06/18 1931  INR 1.36    Sepsis Markers Recent Labs  Lab 01/06/18 1931 01/07/18 0039 01/07/18 0601 01/07/18 0901 01/09/18 [id number] 01/10/18 0747  LATICACIDVEN 6.7* 4.3* 4.7* 5.1*  --   --   PROCALCITON 0.44  --   --   --  27.48 16.68    ABG Recent Labs  Lab 01/07/18 0806 01/11/18 0500 01/12/18 0506  PHART 7.21* 7.33* 7.29*  PCO2ART 114* 41 49*  PO2ART PENDING 77* 83    Liver Enzymes Recent Labs  Lab 01/06/18 1931 01/10/18 0747 01/11/18 0356  AST 32 45*  --   ALT 14* 28  --   ALKPHOS 49 52  --   BILITOT 0.6 0.6  --   ALBUMIN 3.2* 1.9* 1.7*    Cardiac  Enzymes Recent Labs  Lab 01/07/18 0039 01/07/18 0653 01/07/18 1239  TROPONINI 0.03* 0.03* 0.03*    Glucose Recent Labs  Lab 01/11/18 1111 01/11/18 1605 01/11/18 2014 01/12/18 0007 01/12/18 0352 01/12/18 0732  GLUCAP 205* 216* 189* 200* 191* 230*    CXR: No new film   ASSESSMENT / PLAN:  PULMONARY A: Acute hypoxemic respiratory failure HCAP, NOS -possible aspiration ARDS P:   Cont full vent support -continue lung protection strategy.  Continue on 10 of PEEP Cont vent bundle Daily SBT if/when meets criteria  CARDIOVASCULAR A:  Septic shock,  still on Vasopressin and Phenylephrine Paroxysmal atrial fibrillation with RVR-converted with amiodarone 04/11; Amiodarone D/C last evening because of sinus bradycardia P:  MAP goal > 65 mmHg Continue on phenylephrine, vasopressin Wean norepinephrine first due to AFRVR Continue empiric hydrocortisone Echocardiogram-very  poor LVF, EF 20-25%  RENAL A:   AKI, oliguric.  Creatinine slightly worse today but UO adequate P:   Monitor BMET intermittently Monitor I/Os Correct electrolytes as indicated   GASTROINTESTINAL A:   Abdominal distention, improved  P:   SUP: IV PPI  TF protocol but keep rate at trickle  HEMATOLOGIC A:    Thrombocytopenia Leukopenia P:  DVT px: SCD Heparin D/C because of low Plts Monitor CBC intermittently Transfuse per usual guidelines   INFECTIOUS A:   Severe sepsis with shock Community acquired pneumonia Apyrexial today Elevated PCT P:   Monitor temp, WBC count Micro and abx as above  Continue cooling blanket  ENDOCRINE A:   Type 2 diabetes Mild steroid and stress induced hyperglycemia P:   Initiate SSI, moderate scale  NEUROLOGIC A:   Recurrent Ventilator dyssynchrony,   ICU/ventilator associated discomfort Agitation today even on Fentanyl and Propofol drips History of schizophrenia History of TBI P:   RASS goal: -2, -3 Continue fentanyl and propofol   FAMILY   - Updates:  Sister updated  CCM time:40 mins The above time includes time spent in consultation with patient and/or family members and reviewing care plan on multidisciplinary rounds  Jackson Latino, MD PCCM service Pager [phone]    01/12/2018, 11:12 AM

## 2018-01-12 NOTE — Progress Notes (Signed)
PIP increasing. Lavaged and suctioned pt. Moderate amount of thick white secretions. HME changed. RN at bedside as well. Attempted CPT with bed. Pt's O2 saturation drops and pt doesn't tolerate. CPT stopped

## 2018-01-12 NOTE — Progress Notes (Signed)
Maggie, NP notified of patient on going high pressure alarms on ventilator, after breathing treatment and Lasix 40 mg. Peak pressures now ranging between 45-48. Pt O2 sats remain 98-99 %, Pt on 50 % FiO2. New orders given. Will continue to assess.

## 2018-01-12 NOTE — Progress Notes (Signed)
[place], [place] 01/12/18  Subjective:  Patient remains critically ill. He remains on the ventilator. Creatinine down to 1.4. Good urine output noted.   Objective:  Vital signs in last 24 hours:  Temp:  [97.3 F (36.3 C)-99.3 F (37.4 C)] 97.5 F (36.4 C) (04/14 0810) Pulse Rate:  [51-80] 59 (04/14 0810) Resp:  [25-31] 26 (04/14 0810) BP: (65-127)/(55-91) 96/70 (04/14 0800) SpO2:  [92 %-100 %] 97 % (04/14 0810) FiO2 (%):  [45 %-100 %] 50 % (04/14 0754)  Weight change:  Filed Weights   01/07/18 0142 01/08/18 0333 01/09/18 0500  Weight: 107.4 kg (236 lb 12.4 oz) 116.5 kg (256 lb 13.4 oz) 118.4 kg (261 lb 0.4 oz)    Intake/Output:    Intake/Output Summary (Last 24 hours) at 01/12/2018 1011 Last data filed at 01/12/2018 0800 Gross per 24 hour  Intake 3978.8 ml  Output 2200 ml  Net 1778.8 ml     Physical Exam: General:  Critically ill-appearing  HEENT  ET tube/OGT in palce  Neck  supple  Pulm/lungs  ventilator assisted, bilateral rhonchi  CVS/Heart  S1S2 no rubs  Abdomen:   Distended  Extremities: + Edema  Neurologic:  Sedated  Skin:   Dry, no rashes  GU:  Foley catheter in place       Basic Metabolic Panel:  Recent Labs  Lab 01/07/18 0047 01/08/18 0424 01/09/18 0626 01/10/18 0747 01/11/18 0356 01/12/18 0438  NA 137 139  --  139 140 141  K 4.0 3.5  --  3.7 3.4* 3.8  CL 106 103  --  107 110 110  CO2 21* 26  --  '24 24 25  ' GLUCOSE 118* 161*  --  206* 213* 225*  BUN 58* 50*  --  53* 52* 63*  CREATININE 3.06* 2.21* 1.73* 1.77* 1.38* 1.40*  CALCIUM 6.9* 6.0*  --  5.6* 6.8* 7.1*  MG  --   --   --  1.5* 1.9 1.8  PHOS  --   --   --  2.5 1.9*  2.0* 2.9     CBC: Recent Labs  Lab 01/06/18 1931 01/07/18 0601 01/08/18 0424 01/10/18 0747 01/11/18 0358 01/12/18 0438  WBC 8.3 0.8* 1.5* 5.3 3.9 3.4*  NEUTROABS 6.0  --   --   --   --   --   HGB 14.5 13.3 12.4* 11.6* 10.7* 10.1*  HCT 45.5 43.0 39.3* 35.4* 32.0* 30.4*  MCV  85.5 88.1 85.7 84.4 84.7 84.6  PLT 167 127* 122* 98* 74* 69*     No results found for: HEPBSAG, HEPBSAB, HEPBIGM    Microbiology:  Recent Results (from the past 240 hour(s))  Blood Culture (routine x 2)     Status: None   Collection Time: 01/06/18  7:44 PM  Result Value Ref Range Status   Specimen Description BLOOD BLOOD LEFT WRIST  Final   Special Requests   Final    BOTTLES DRAWN AEROBIC AND ANAEROBIC Blood Culture adequate volume   Culture   Final    NO GROWTH 5 DAYS Performed at [facility], [address] [place], [place] [address]    Report Status 01/11/2018 FINAL  Final  Urine culture     Status: Abnormal   Collection Time: 01/06/18  7:44 PM  Result Value Ref Range Status   Specimen Description   Final    URINE, RANDOM Performed at [facility], [address], [place], [place] [address]    Special Requests  Final    NONE Performed at [facility], [address] [place], [place] [address]    Culture (A)  Final    10,000 COLONIES/mL STAPHYLOCOCCUS SPECIES (COAGULASE NEGATIVE) CALL MICROBIOLOGY LAB IF SENSITIVITIES ARE REQUIRED. Performed at [facility], [place] [address], [place], [place] [address]    Report Status 01/08/2018 FINAL  Final  MRSA PCR Screening     Status: Abnormal   Collection Time: 01/06/18 11:51 PM  Result Value Ref Range Status   MRSA by PCR POSITIVE (A) NEGATIVE Final    Comment:        The GeneXpert MRSA Assay (FDA approved for NASAL specimens only), is one component of a comprehensive MRSA colonization surveillance program. It is not intended to diagnose MRSA infection nor to guide or monitor treatment for MRSA infections. RESULT CALLED TO, READ BACK BY AND VERIFIED WITH: BETH BUONO 01/07/18 @ 0121  [place]   Performed at [facility], [address] [place], [place] [address]   Blood Culture (routine x 2)     Status: None   Collection Time: 01/07/18  7:19 AM  Result Value Ref Range  Status   Specimen Description BLOOD RIGHT HAND  Final   Special Requests   Final    BOTTLES DRAWN AEROBIC AND ANAEROBIC Blood Culture results may not be optimal due to an inadequate volume of blood received in culture bottles   Culture   Final    NO GROWTH 5 DAYS Performed at [facility], [address], [place], [place] [address]    Report Status 01/12/2018 FINAL  Final  Culture, respiratory (NON-Expectorated)     Status: None   Collection Time: 01/07/18 12:28 PM  Result Value Ref Range Status   Specimen Description   Final    TRACHEAL ASPIRATE Performed at [facility], [address] [place], [place] [address]    Special Requests   Final    Normal Performed at [facility], [address] [place], [place] [address]    Gram Stain   Final    MODERATE WBC PRESENT,BOTH PMN AND MONONUCLEAR RARE GRAM POSITIVE COCCI RARE YEAST    Culture   Final    Consistent with normal respiratory flora. Performed at [facility], [place] [address] [place],  [address]    Report Status 01/09/2018 FINAL  Final    Coagulation Studies: No results for input(s): LABPROT, INR in the last 72 hours.  Urinalysis: No results for input(s): COLORURINE, LABSPEC, PHURINE, GLUCOSEU, HGBUR, BILIRUBINUR, KETONESUR, PROTEINUR, UROBILINOGEN, NITRITE, LEUKOCYTESUR in the last 72 hours.  Invalid input(s): APPERANCEUR    Imaging: Dg Chest Port 1 View  Result Date: 01/12/2018 CLINICAL DATA:  Acute respiratory failure with hypoxia. Severe sepsis with septic shock. On ventilator. EXAM: PORTABLE CHEST 1 VIEW COMPARISON:  01/11/2018 FINDINGS: Endotracheal tube and nasogastric tube remain in appropriate position. Heart size remains within normal limits. Low lung volumes again seen with diffuse interstitial infiltrates. No evidence of focal consolidation or significant pleural effusion. IMPRESSION: Stable low lung volumes with diffuse interstitial infiltrates/edema.  Electronically Signed   By: Earle Gell M.D.   On: 01/12/2018 07:37   Dg Chest Port 1 View  Result Date: 01/11/2018 CLINICAL DATA:  Respiratory failure EXAM: PORTABLE CHEST 1 VIEW COMPARISON:  January 10, 2018 FINDINGS: The ETT terminates 2.8 cm above the carina. No pneumothorax. The cardiomediastinal silhouette is stable. Pulmonary edema has decreased but persists. No other changes. IMPRESSION: 1. Stable support apparatus. 2.  Persistent but decreased pulmonary edema. 3. No other change. Electronically Signed   By: Dorise Bullion III M.D   On: 01/11/2018 07:01     Medications:   . sodium chloride 10 mL/hr at 01/12/18 0800  . ceFEPime (MAXIPIME) IV Stopped (01/11/18 2300)  . fentaNYL 200 mcg/hr (01/12/18 0800)  . magnesium sulfate 1 - 4 g bolus IVPB 2 g (01/12/18 1003)  . metronidazole 500 mg (01/12/18 1003)  . norepinephrine (LEVOPHED) Adult infusion Stopped (01/10/18 2209)  . phenylephrine (NEO-SYNEPHRINE) Adult infusion 50.133 mcg/min (01/12/18 0600)  . propofol (DIPRIVAN) infusion 30 mcg/kg/min (01/12/18 1002)  . vancomycin 1,000 mg (01/12/18 1003)  . vasopressin (PITRESSIN) infusion - *FOR SHOCK* 0.03 Units/min (01/12/18 0600)   . artificial tears  1 application Both Eyes J2I  . chlorhexidine gluconate (MEDLINE KIT)  15 mL Mouth Rinse BID  . Chlorhexidine Gluconate Cloth  6 each Topical Q0600  . feeding supplement (PRO-STAT SUGAR FREE 64)  30 mL Per Tube BID  . feeding supplement (VITAL HIGH PROTEIN)  1,000 mL Per Tube Q24H  . hydrocortisone sod succinate (SOLU-CORTEF) inj  50 mg Intravenous Q6H  . insulin aspart  0-15 Units Subcutaneous Q4H  . ipratropium-albuterol  3 mL Nebulization Q4H  . mouth rinse  15 mL Mouth Rinse 10 times per day  . mupirocin ointment  1 application Nasal BID  . pantoprazole sodium  40 mg Per Tube Daily   sodium chloride, acetaminophen, fentaNYL, fentaNYL (SUBLIMAZE) injection, midazolam, ondansetron (ZOFRAN) IV, vecuronium  Assessment/ Plan:  57 y.o.  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  Patient is a current smoker and is resident of assisted living facility  1.  Acute renal failure, likely severe ATN from concurrent illness and sepsis Baseline creatinine is unknown  -Renal function continues to improve.  Creatinine down to 1.3.  Good urine output at 2.1 L over the preceding 24 hours.  No indication for dialysis.  2.  Acute respiratory failure Patient has been difficult to wean from the ventilator.  Continue ventilatory support at this time.  3. Sepsis Requiring multiple pressors Management as per ICU team  4.  Hypocalcemia: Improved significantly after calcium drip.  Patient has been taken off of calcium gluconate at this time.  LOS: 6 Laiken Sandy 4/14/201910:11 AM  [place], [place]  Note: This note was prepared with Dragon dictation. Any transcription errors are unintentional

## 2018-01-12 NOTE — Progress Notes (Signed)
Maggie, NP notified of patients ongoing non-compliance with ventilator. Ventilator continues to alarm high peak pressures, Peak pressures remaining between 52-55. New orders given. Will continue to assess.

## 2018-01-12 NOTE — Progress Notes (Signed)
RT called to bedside.Ventilator alarming peak pressures high and regulation pressure limited. Pt lung sounds course in all fields. O2 sats decreasing to 90 %. HR 80's NSR. Sedation increased per orders, no change. Pt administered Vecuronium per PRN orders. Will continue to assess.

## 2018-01-12 NOTE — Progress Notes (Signed)
[employer] [facility] [place] [facility]   PATIENT NAME: Tommy Mathews    MR#:  [id number]  DATE OF BIRTH:  01/27/1961  SUBJECTIVE:  CHIEF COMPLAINT:   Chief Complaint  Patient presents with  . Respiratory Distress  Patient remains critically ill, intubated and sedated  REVIEW OF SYSTEMS:  CONSTITUTIONAL: No fever, fatigue or weakness.  EYES: No blurred or double vision.  EARS, NOSE, AND THROAT: No tinnitus or ear pain.  RESPIRATORY: No cough, shortness of breath, wheezing or hemoptysis.  CARDIOVASCULAR: No chest pain, orthopnea, edema.  GASTROINTESTINAL: No nausea, vomiting, diarrhea or abdominal pain.  GENITOURINARY: No dysuria, hematuria.  ENDOCRINE: No polyuria, nocturia,  HEMATOLOGY: No anemia, easy bruising or bleeding SKIN: No rash or lesion. MUSCULOSKELETAL: No joint pain or arthritis.   NEUROLOGIC: No tingling, numbness, weakness.  PSYCHIATRY: No anxiety or depression.   ROS  DRUG ALLERGIES:  Not on File  VITALS:  Blood pressure 96/70, pulse (!) 59, temperature (!) 97.5 F (36.4 C), resp. rate (!) 26, height 5\' 10"  (1.778 m), weight 118.4 kg (261 lb 0.4 oz), SpO2 97 %.  PHYSICAL EXAMINATION:  GENERAL:  57 y.o.-year-old patient lying in the bed with no acute distress.  EYES: Pupils equal, round, reactive to light and accommodation. No scleral icterus. Extraocular muscles intact.  HEENT: Head atraumatic, normocephalic. Oropharynx and nasopharynx clear.  NECK:  Supple, no jugular venous distention. No thyroid enlargement, no tenderness.  LUNGS: Normal breath sounds bilaterally, no wheezing, rales,rhonchi or crepitation. No use of accessory muscles of respiration.  CARDIOVASCULAR: S1, S2 normal. No murmurs, rubs, or gallops.  ABDOMEN: Soft, nontender, nondistended. Bowel sounds present. No organomegaly or mass.  EXTREMITIES: No pedal edema, cyanosis, or clubbing.  NEUROLOGIC: Cranial nerves II through XII are intact. Muscle strength 5/5 in all  extremities. Sensation intact. Gait not checked.  PSYCHIATRIC: The patient is alert and oriented x 3.  SKIN: No obvious rash, lesion, or ulcer.   Physical Exam LABORATORY PANEL:   CBC Recent Labs  Lab 01/12/18 0438  WBC 3.4*  HGB 10.1*  HCT 30.4*  PLT 69*   ------------------------------------------------------------------------------------------------------------------  Chemistries  Recent Labs  Lab 01/10/18 0747  01/12/18 0438  NA 139   < > 141  K 3.7   < > 3.8  CL 107   < > 110  CO2 24   < > 25  GLUCOSE 206*   < > 225*  BUN 53*   < > 63*  CREATININE 1.77*   < > 1.40*  CALCIUM 5.6*   < > 7.1*  MG 1.5*   < > 1.8  AST 45*  --   --   ALT 28  --   --   ALKPHOS 52  --   --   BILITOT 0.6  --   --    < > = values in this interval not displayed.   ------------------------------------------------------------------------------------------------------------------  Cardiac Enzymes Recent Labs  Lab 01/07/18 0653 01/07/18 1239  TROPONINI 0.03* 0.03*   ------------------------------------------------------------------------------------------------------------------  RADIOLOGY:  Dg Chest Port 1 View  Result Date: 01/12/2018 CLINICAL DATA:  Acute respiratory failure with hypoxia. Severe sepsis with septic shock. On ventilator. EXAM: PORTABLE CHEST 1 VIEW COMPARISON:  01/11/2018 FINDINGS: Endotracheal tube and nasogastric tube remain in appropriate position. Heart size remains within normal limits. Low lung volumes again seen with diffuse interstitial infiltrates. No evidence of focal consolidation or significant pleural effusion. IMPRESSION: Stable low lung volumes with diffuse interstitial infiltrates/edema. Electronically Signed   By: Jonny RuizJohn  Eppie Gibson M.D.   On: 01/12/2018 07:37   Dg Chest Port 1 View  Result Date: 01/11/2018 CLINICAL DATA:  Respiratory failure EXAM: PORTABLE CHEST 1 VIEW COMPARISON:  January 10, 2018 FINDINGS: The ETT terminates 2.8 cm above the carina. No  pneumothorax. The cardiomediastinal silhouette is stable. Pulmonary edema has decreased but persists. No other changes. IMPRESSION: 1. Stable support apparatus. 2. Persistent but decreased pulmonary edema. 3. No other change. Electronically Signed   By: Gerome Sam III M.D   On: 01/11/2018 07:01    ASSESSMENT AND PLAN:  1 acute severe septic shock Stable Due to his pneumonia and UTI Continue empiric cefepime/vancomycin/Flagyl, vasopressor support with weaning as tolerated with epinephrine, vasopressin and Levophed drips, IV fluids for rehydration, and follow-up on cultures  Pro-calcitonin 27.48 Echocardiogram noted for ejection fraction 20-25%  2 acute  pneumonia/UTI Continue pneumonia protocol, antibiotics per above  3 GI bleed Stable Continue to hold Xarelto, continue PPI, methotrexate on hold, GI consulted  4 acute respiratory failure with hypoxia Stable secondary to sepsis and pneumonia Continue mechanical ventilation with weaning as tolerated  5AKI  Resolving, creatinine 1.3 Due to ATN and sepsis Nephrology input appreciated  6 chronic diabetes mellitus type 2 Stable Continue sliding scale insulin with Accu-Cheks per routine  7  chronic diabetes mellitus type 2 Stable Continue sliding scale insulin with Accu-Cheks per routine   All the records are reviewed and case discussed with Care Management/Social Workerr. Management plans discussed with the patient, family and they are in agreement.  CODE STATUS: full  TOTAL TIME TAKING CARE OF THIS PATIENT: 35 minutes.     POSSIBLE D/C IN 3-5 DAYS, DEPENDING ON CLINICAL CONDITION.   Tommy Mathews M.D on 01/12/2018   Between 7am to 6pm - Pager - [phone]  After 6pm go to [email or web address] - password [email or web address]  [facility] [place] [facility]  Office  [phone]  CC: Primary care physician; Patient, [name]  Note: This dictation was prepared with Dragon dictation along with smaller phrase  technology. Any transcriptional errors that result from this process are unintentional.

## 2018-01-12 NOTE — Progress Notes (Signed)
Pharmacy Electrolyte Monitoring Consult:  Pharmacy consulted to assist in monitoring and replacing electrolytes in this 57 y.o. male admitted on 01/06/2018 with Respiratory Distress  Patient currently requiring mechanical ventilation and multiple pressors.   Labs:  Sodium (mmol/L)  Date Value  01/12/2018 141   Potassium (mmol/L)  Date Value  01/12/2018 3.8   Magnesium (mg/dL)  Date Value  82/95/621304/14/2019 1.8   Phosphorus (mg/dL)  Date Value  08/65/784604/14/2019 2.9   Calcium (mg/dL)  Date Value  96/29/528404/14/2019 7.1 (L)   Albumin (g/dL)  Date Value  13/24/401004/13/2019 1.7 (L)   Corrected Calcium: 8.9  Goal: K > 4.0, Mg > 2.0  Assessment: K: 3.8, Phos 2.9, Mg 1.8  Ordered furosemide 40mg  IV x 1 this morning.   Plan: Will give KCl 40mEq PO x 1 and Magnesium 2gm x 1   Recheck electrolytes with AM labs   Dillon Mcreynolds C 01/12/2018 9:25 AM

## 2018-01-13 ENCOUNTER — Inpatient Hospital Stay: Payer: Medicaid Other

## 2018-01-13 ENCOUNTER — Inpatient Hospital Stay: Payer: Self-pay

## 2018-01-13 DIAGNOSIS — K254 Chronic or unspecified gastric ulcer with hemorrhage: Secondary | ICD-10-CM

## 2018-01-13 LAB — VANCOMYCIN, TROUGH: VANCOMYCIN TR: 14 ug/mL — AB (ref 15–20)

## 2018-01-13 LAB — BASIC METABOLIC PANEL
ANION GAP: 6 (ref 5–15)
BUN: 63 mg/dL — AB (ref 6–20)
CHLORIDE: 108 mmol/L (ref 101–111)
CO2: 27 mmol/L (ref 22–32)
Calcium: 7.1 mg/dL — ABNORMAL LOW (ref 8.9–10.3)
Creatinine, Ser: 1.31 mg/dL — ABNORMAL HIGH (ref 0.61–1.24)
GFR calc Af Amer: >60 mL/min (ref >60)
GFR calc non Af Amer: 60 mL/min — ABNORMAL LOW (ref >60)
GLUCOSE: 189 mg/dL — AB (ref 65–99)
POTASSIUM: 3.4 mmol/L — AB (ref 3.5–5.1)
Sodium: 141 mmol/L (ref 135–145)

## 2018-01-13 LAB — BLOOD GAS, ARTERIAL
ACID-BASE DEFICIT: 0.3 mmol/L (ref 0.0–2.0)
BICARBONATE: 26.6 mmol/L (ref 20.0–28.0)
FIO2: 60
MECHVT: 400 mL
Mechanical Rate: 30
O2 SAT: 95.9 %
PCO2 ART: 54 mmHg — AB (ref 32.0–48.0)
PEEP: 10 cmH2O
PH ART: 7.3 — AB (ref 7.350–7.450)
Patient temperature: 37
pO2, Arterial: 89 mmHg (ref 83.0–108.0)

## 2018-01-13 LAB — GLUCOSE, CAPILLARY
GLUCOSE-CAPILLARY: 159 mg/dL — AB (ref 65–99)
GLUCOSE-CAPILLARY: 186 mg/dL — AB (ref 65–99)
Glucose-Capillary: 163 mg/dL — ABNORMAL HIGH (ref 65–99)
Glucose-Capillary: 174 mg/dL — ABNORMAL HIGH (ref 65–99)
Glucose-Capillary: 180 mg/dL — ABNORMAL HIGH (ref 65–99)
Glucose-Capillary: 184 mg/dL — ABNORMAL HIGH (ref 65–99)

## 2018-01-13 LAB — CBC WITH DIFFERENTIAL/PLATELET
BASOS PCT: 0 %
Basophils Absolute: 0 10*3/uL (ref 0–0.1)
Eosinophils Absolute: 0 10*3/uL (ref 0–0.7)
Eosinophils Relative: 0 %
HEMATOCRIT: 29.9 % — AB (ref 40.0–52.0)
HEMOGLOBIN: 9.8 g/dL — AB (ref 13.0–18.0)
LYMPHS PCT: 14 %
Lymphs Abs: 0.6 10*3/uL — ABNORMAL LOW (ref 1.0–3.6)
MCH: 27.6 pg (ref 26.0–34.0)
MCHC: 32.7 g/dL (ref 32.0–36.0)
MCV: 84.3 fL (ref 80.0–100.0)
MONO ABS: 0.4 10*3/uL (ref 0.2–1.0)
MONOS PCT: 9 %
NEUTROS ABS: 3 10*3/uL (ref 1.4–6.5)
NEUTROS PCT: 77 %
Platelets: 91 10*3/uL — ABNORMAL LOW (ref 150–440)
RBC: 3.54 MIL/uL — ABNORMAL LOW (ref 4.40–5.90)
RDW: 18.2 % — AB (ref 11.5–14.5)
WBC: 3.9 10*3/uL (ref 3.8–10.6)

## 2018-01-13 LAB — PHOSPHORUS: PHOSPHORUS: 2.2 mg/dL — AB (ref 2.5–4.6)

## 2018-01-13 LAB — MAGNESIUM: Magnesium: 1.9 mg/dL (ref 1.7–2.4)

## 2018-01-13 LAB — ALBUMIN: Albumin: 2.2 g/dL — ABNORMAL LOW (ref 3.5–5.0)

## 2018-01-13 MED ORDER — ADULT MULTIVITAMIN LIQUID CH
15.0000 mL | Freq: Every day | ORAL | Status: DC
  Administered 2018-01-13 – 2018-01-28 (×16): 15 mL
  Filled 2018-01-13 (×17): qty 15

## 2018-01-13 MED ORDER — DEXMEDETOMIDINE HCL IN NACL 400 MCG/100ML IV SOLN
0.4000 ug/kg/h | INTRAVENOUS | Status: DC
  Administered 2018-01-13: 0.4 ug/kg/h via INTRAVENOUS
  Administered 2018-01-13: 0.6 ug/kg/h via INTRAVENOUS
  Administered 2018-01-13: 0.9 ug/kg/h via INTRAVENOUS
  Administered 2018-01-13: 0.8 ug/kg/h via INTRAVENOUS
  Administered 2018-01-14 (×4): 0.9 ug/kg/h via INTRAVENOUS
  Administered 2018-01-14: 1 ug/kg/h via INTRAVENOUS
  Administered 2018-01-14: 0.9 ug/kg/h via INTRAVENOUS
  Administered 2018-01-15 – 2018-01-17 (×15): 1 ug/kg/h via INTRAVENOUS
  Administered 2018-01-17: 1.2 ug/kg/h via INTRAVENOUS
  Administered 2018-01-17 (×6): 1 ug/kg/h via INTRAVENOUS
  Administered 2018-01-17 – 2018-01-18 (×9): 1.2 ug/kg/h via INTRAVENOUS
  Administered 2018-01-18: 1.1 ug/kg/h via INTRAVENOUS
  Administered 2018-01-19 (×3): 1.2 ug/kg/h via INTRAVENOUS
  Filled 2018-01-13 (×46): qty 100

## 2018-01-13 MED ORDER — SODIUM CHLORIDE 0.9% FLUSH
10.0000 mL | INTRAVENOUS | Status: DC | PRN
  Administered 2018-03-08: 21:00:00 30 mL
  Administered 2018-03-14: 20:00:00 40 mL
  Filled 2018-01-13 (×2): qty 40

## 2018-01-13 MED ORDER — AMIODARONE LOAD VIA INFUSION
150.0000 mg | Freq: Once | INTRAVENOUS | Status: DC
  Filled 2018-01-13: qty 83.34

## 2018-01-13 MED ORDER — LORAZEPAM 2 MG/ML IJ SOLN
4.0000 mg | Freq: Once | INTRAMUSCULAR | Status: AC
  Administered 2018-01-13: 4 mg via INTRAVENOUS

## 2018-01-13 MED ORDER — PRO-STAT SUGAR FREE PO LIQD
60.0000 mL | Freq: Two times a day (BID) | ORAL | Status: DC
  Administered 2018-01-13 – 2018-01-22 (×19): 60 mL

## 2018-01-13 MED ORDER — POTASSIUM & SODIUM PHOSPHATES 280-160-250 MG PO PACK
1.0000 | PACK | Freq: Three times a day (TID) | ORAL | Status: AC
  Administered 2018-01-13: 1 via ORAL
  Filled 2018-01-13: qty 1

## 2018-01-13 MED ORDER — AMIODARONE HCL IN DEXTROSE 360-4.14 MG/200ML-% IV SOLN
60.0000 mg/h | INTRAVENOUS | Status: DC
  Administered 2018-01-13 (×2): 30 mg/h via INTRAVENOUS
  Administered 2018-01-14 – 2018-01-15 (×7): 60 mg/h via INTRAVENOUS
  Filled 2018-01-13 (×11): qty 200

## 2018-01-13 MED ORDER — VITAL HIGH PROTEIN PO LIQD
1000.0000 mL | ORAL | Status: DC
  Administered 2018-01-14 – 2018-01-20 (×6): 1000 mL

## 2018-01-13 MED ORDER — LORAZEPAM 2 MG/ML IJ SOLN
INTRAMUSCULAR | Status: AC
  Administered 2018-01-13: 4 mg via INTRAVENOUS
  Filled 2018-01-13: qty 2

## 2018-01-13 MED ORDER — AMIODARONE HCL IN DEXTROSE 360-4.14 MG/200ML-% IV SOLN
60.0000 mg/h | INTRAVENOUS | Status: DC
  Filled 2018-01-13: qty 200

## 2018-01-13 MED ORDER — AMIODARONE IV BOLUS ONLY 150 MG/100ML
INTRAVENOUS | Status: AC
  Administered 2018-01-13: 150 mg via INTRAVENOUS
  Filled 2018-01-13: qty 100

## 2018-01-13 MED ORDER — AMIODARONE HCL IN DEXTROSE 360-4.14 MG/200ML-% IV SOLN: 30.0000 mg/h | INTRAVENOUS | Status: DC

## 2018-01-13 MED ORDER — AMIODARONE HCL IN DEXTROSE 360-4.14 MG/200ML-% IV SOLN
60.0000 mg/h | INTRAVENOUS | Status: AC
  Administered 2018-01-13: 60 mg/h via INTRAVENOUS

## 2018-01-13 MED ORDER — AMIODARONE IV BOLUS ONLY 150 MG/100ML
150.0000 mg | Freq: Once | INTRAVENOUS | Status: AC
  Administered 2018-01-13: 150 mg via INTRAVENOUS

## 2018-01-13 MED ORDER — POTASSIUM CHLORIDE 20 MEQ PO PACK
40.0000 meq | PACK | Freq: Once | ORAL | Status: AC
  Administered 2018-01-13: 40 meq
  Filled 2018-01-13: qty 2

## 2018-01-13 MED ORDER — HYDROCORTISONE NA SUCCINATE PF 100 MG IJ SOLR
50.0000 mg | INTRAMUSCULAR | Status: AC
  Administered 2018-01-14 – 2018-01-15 (×2): 50 mg via INTRAVENOUS
  Filled 2018-01-13 (×2): qty 2

## 2018-01-13 NOTE — Progress Notes (Signed)
Arrived to place PICC. Sheralyn Boatmanoni RN asked that I return a little later. Patient needed adjustment of NG. Will defer to later after current care needs are complete.

## 2018-01-13 NOTE — Progress Notes (Signed)
[employer] [facility] [place] at [facility]   PATIENT NAME: Tommy PangJames Durkee    MR#:  [id number]  DATE OF BIRTH:  01/09/1961  SUBJECTIVE:  CHIEF COMPLAINT:   Chief Complaint  Patient presents with  . Respiratory Distress  Off pressors, remains critically ill, becomes agitated with weaning of sedation  REVIEW OF SYSTEMS:  CONSTITUTIONAL: No fever, fatigue or weakness.  EYES: No blurred or double vision.  EARS, NOSE, AND THROAT: No tinnitus or ear pain.  RESPIRATORY: No cough, shortness of breath, wheezing or hemoptysis.  CARDIOVASCULAR: No chest pain, orthopnea, edema.  GASTROINTESTINAL: No nausea, vomiting, diarrhea or abdominal pain.  GENITOURINARY: No dysuria, hematuria.  ENDOCRINE: No polyuria, nocturia,  HEMATOLOGY: No anemia, easy bruising or bleeding SKIN: No rash or lesion. MUSCULOSKELETAL: No joint pain or arthritis.   NEUROLOGIC: No tingling, numbness, weakness.  PSYCHIATRY: No anxiety or depression.   ROS  DRUG ALLERGIES:  Not on File  VITALS:  Blood pressure 98/67, pulse 82, temperature (!) 97.5 F (36.4 C), resp. rate (!) 26, height 5\' 10"  (1.778 m), weight 130.1 kg (286 lb 13.1 oz), SpO2 96 %.  PHYSICAL EXAMINATION:  GENERAL:  57 y.o.-year-old patient lying in the bed with no acute distress.  EYES: Pupils equal, round, reactive to light and accommodation. No scleral icterus. Extraocular muscles intact.  HEENT: Head atraumatic, normocephalic. Oropharynx and nasopharynx clear.  NECK:  Supple, no jugular venous distention. No thyroid enlargement, no tenderness.  LUNGS: Normal breath sounds bilaterally, no wheezing, rales,rhonchi or crepitation. No use of accessory muscles of respiration.  CARDIOVASCULAR: S1, S2 normal. No murmurs, rubs, or gallops.  ABDOMEN: Soft, nontender, nondistended. Bowel sounds present. No organomegaly or mass.  EXTREMITIES: No pedal edema, cyanosis, or clubbing.  NEUROLOGIC: Cranial nerves II through XII are intact. Muscle  strength 5/5 in all extremities. Sensation intact. Gait not checked.  PSYCHIATRIC: The patient is alert and oriented x 3.  SKIN: No obvious rash, lesion, or ulcer.   Physical Exam LABORATORY PANEL:   CBC Recent Labs  Lab 01/13/18 0337  WBC 3.9  HGB 9.8*  HCT 29.9*  PLT 91*   ------------------------------------------------------------------------------------------------------------------  Chemistries  Recent Labs  Lab 01/10/18 0747  01/13/18 0337  NA 139   < > 141  K 3.7   < > 3.4*  CL 107   < > 108  CO2 24   < > 27  GLUCOSE 206*   < > 189*  BUN 53*   < > 63*  CREATININE 1.77*   < > 1.31*  CALCIUM 5.6*   < > 7.1*  MG 1.5*   < > 1.9  AST 45*  --   --   ALT 28  --   --   ALKPHOS 52  --   --   BILITOT 0.6  --   --    < > = values in this interval not displayed.   ------------------------------------------------------------------------------------------------------------------  Cardiac Enzymes Recent Labs  Lab 01/07/18 0653 01/07/18 1239  TROPONINI 0.03* 0.03*   ------------------------------------------------------------------------------------------------------------------  RADIOLOGY:  Dg Chest Port 1 View  Result Date: 01/13/2018 CLINICAL DATA:  Respiratory failure. EXAM: PORTABLE CHEST 1 VIEW COMPARISON:  One-view chest x-ray 01/12/2018. FINDINGS: Heart size is exaggerate by low lung volumes. Endotracheal tube is stable. Left greater than right interstitial and airspace disease is similar the prior exam. Overall lung volumes are slightly improved. IMPRESSION: 1. Stable appearance of left greater than right interstitial and airspace disease, likely a combination of edema and possible infection. 2.  Support apparatus is stable. 3. Slight improvement in lung volumes. Electronically Signed   By: Marin Roberts M.D.   On: 01/13/2018 07:49   Dg Chest Port 1 View  Result Date: 01/12/2018 CLINICAL DATA:  Acute respiratory failure with hypoxia. Severe sepsis with  septic shock. On ventilator. EXAM: PORTABLE CHEST 1 VIEW COMPARISON:  01/11/2018 FINDINGS: Endotracheal tube and nasogastric tube remain in appropriate position. Heart size remains within normal limits. Low lung volumes again seen with diffuse interstitial infiltrates. No evidence of focal consolidation or significant pleural effusion. IMPRESSION: Stable low lung volumes with diffuse interstitial infiltrates/edema. Electronically Signed   By: Myles Rosenthal M.D.   On: 01/12/2018 07:37   [place] Ekg Site Rite  Result Date: 01/13/2018 If Site Rite image not attached, placement could not be confirmed due to current cardiac rhythm.   ASSESSMENT AND PLAN:  1 acute severe septic shock Resolving Due to his pneumonia and UTI Continue empiric cefepime/vancomycin/Flagyl, vasopressor support  weaned off,IV fluids for rehydration  Pro-calcitonin was 27.48 Echocardiogram noted for ejection fraction 20-25%  2 acute pneumonia/UTI Continue pneumonia protocol, antibiotics per above  3GI bleed Stable Continue to hold Xarelto,continuePPI, methotrexate on hold,GI consulted  4 acute respiratory failure with hypoxia Stable secondary to sepsis and pneumonia Continue mechanical ventilation with weaning as tolerated-becomes agitated with weaning of sedation  5AKI  Resolving Due toATN and sepsis Nephrology input appreciated  6chronic diabetes mellitus type 2 Stable Continue sliding scale insulin with Accu-Cheks per routine  7 chronic diabetes mellitus type 2 Stable Continue sliding scale insulin with Accu-Cheks per routine  All the records are reviewed and case discussed with Care Management/Social Workerr. Management plans discussed with the patient, family and they are in agreement.  CODE STATUS: full  TOTAL TIME TAKING CARE OF THIS PATIENT: 35 minutes.     POSSIBLE D/C IN 3-5 DAYS, DEPENDING ON CLINICAL CONDITION.   Evelena Asa Raley Novicki M.D on 01/13/2018   Between 7am to 6pm - Pager  - [phone]  After 6pm go to [email or web address] - password [email or web address]  [facility] [place] [facility]  Office  [phone]  CC: Primary care physician; Patient, [name]  Note: This dictation was prepared with Dragon dictation along with smaller phrase technology. Any transcriptional errors that result from this process are unintentional.

## 2018-01-13 NOTE — Progress Notes (Signed)
PULMONARY / CRITICAL CARE MEDICINE   Name: Tommy Mathews MRN: [id number] DOB: [birth year]    ADMISSION DATE:  01/06/2018   PT PROFILE:  [age] M [facility] resident due to psychiatric illness and history of TBI admitted with severe bilateral pneumonia, severe sepsis/septic shock.  Intubated in the ED shortly after arrival.  Was also noted to have dark coffee-ground material suctioned from stomach.  Patient chronically on rivaroxaban.   MAJOR EVENTS/TEST RESULTS: 04/08 Admission as above 04/08 CT chest: Multifocal severe pneumonia with RUL collapse and soft tissue obstructing RUL bronchus. 04/08 CTAP: Mild small bowel ileus, potential enteritis. Nasogastric tube terminates in distal stomach. Cholelithiasis without CT findings of acute cholecystitis.Consider bronchoscopy.  04/09 nephrology consultation: No acute indication for dialysis at present. 04/09 gastroenterology consultation: Recommend high-dose PPI therapy. 04/10 severely hypoxemic despite 100% FiO2.  Recruitment maneuver with significant improvement.  PEEP increased and ARDS strategy on ventilator implemented.  Neuromuscular blockade implemented due to ventilator dyssynchrony. 04/11 PAF with RVR > converted with amiodarone. Gas exchange improving. Off Nimbex 04/13 High airway pressure with vent dyssynchony; given Vecuronium; FIO2 increased to 50%  4/13 Amiodarone D/C because of SB  INDWELLING DEVICES:: ETT 04/08 >>  L femoral CVL 04/08 >>   MICRO DATA: MRSA PCR 04/08 >> POS Urine 04/08 >> 10k coag negative staph Resp 04/09 >> c/w NOF Blood 04/08 >>  Blood 04/09 >>   ANTIMICROBIALS:  Unasyn 04/08 >> 04/10 Vanc 04/08 >>  Cefepime 04/10 >>  Metronidazole 04/10 >>     SUBJECTIVE:  Patient remains intubated,sedated Remains on high vent support fio2 at 60% Remains critically ill    VITAL SIGNS: BP (!) 91/59   Pulse 79   Temp (!) 97.3 F (36.3 C) (Bladder)   Resp (!) 30   Ht 5\' 10"  (1.778 m)   Wt 286 lb 13.1 oz (130.1 kg)    SpO2 98%   BMI 41.15 kg/m    VENTILATOR SETTINGS: Vent Mode: PRVC FiO2 (%):  [50 %-60 %] 60 % Set Rate:  [30 bmp] 30 bmp Vt Set:  [400 mL] 400 mL PEEP:  [10 cmH20] 10 cmH20 Plateau Pressure:  [28 cmH20-33 cmH20] 28 cmH20  INTAKE / OUTPUT: I/O last 3 completed shifts: In: 4994.9 [I.V.:3774.9; NG/GT:720; IV Piggyback:500] Out: 6125 [Urine:6125]  PHYSICAL EXAMINATION: General: RASS -4, -5. Sedated, synchronous with ventilator Neuro: PERRLA, EOMI, DTRs symmetric, no spontaneous movement HEENT: PERRL, sclerae white Cardiovascular: RRR, no M Lungs: bilateral rhonchi anteriorly, no wheezes Abdomen: Mildly distended, bowel sounds present Extremities: Warm, 2+ edema upper > lower Skin: No lesions noted  LABS:  BMET Recent Labs  Lab 01/11/18 0356 01/12/18 0438 01/13/18 0337  NA 140 141 141  K 3.4* 3.8 3.4*  CL 110 110 108  CO2 24 25 27   BUN 52* 63* 63*  CREATININE 1.38* 1.40* 1.31*  GLUCOSE 213* 225* 189*    Electrolytes Recent Labs  Lab 01/11/18 0356 01/12/18 0438 01/13/18 0337  CALCIUM 6.8* 7.1* 7.1*  MG 1.9 1.8 1.9  PHOS 1.9*  2.0* 2.9 2.2*    CBC Recent Labs  Lab 01/11/18 0358 01/12/18 0438 01/13/18 0337  WBC 3.9 3.4* 3.9  HGB 10.7* 10.1* 9.8*  HCT 32.0* 30.4* 29.9*  PLT 74* 69* 91*    Coag's Recent Labs  Lab 01/06/18 1931  INR 1.36    Sepsis Markers Recent Labs  Lab 01/06/18 1931 01/07/18 0039 01/07/18 0601 01/07/18 0901 01/09/18 0626 01/10/18 0747  LATICACIDVEN 6.7* 4.3* 4.7* 5.1*  --   --  PROCALCITON 0.44  --   --   --  27.48 16.68    ABG Recent Labs  Lab 01/11/18 0500 01/12/18 0506 01/13/18 0419  PHART 7.33* 7.29* 7.30*  PCO2ART 41 49* 54*  PO2ART 77* 83 89    Liver Enzymes Recent Labs  Lab 01/06/18 1931 01/10/18 0747 01/11/18 0356 01/13/18 0337  AST 32 45*  --   --   ALT 14* 28  --   --   ALKPHOS 49 52  --   --   BILITOT 0.6 0.6  --   --   ALBUMIN 3.2* 1.9* 1.7* 2.2*    Cardiac Enzymes Recent Labs   Lab 01/07/18 0039 01/07/18 0653 01/07/18 1239  TROPONINI 0.03* 0.03* 0.03*    Glucose Recent Labs  Lab 01/12/18 1137 01/12/18 1514 01/12/18 1933 01/13/18 0020 01/13/18 0335 01/13/18 0758  GLUCAP 188* 207* 191* 180* 184* 159*    CXR: No new film   ASSESSMENT / PLAN: 57 yo AAM admitted for severe resp failure from severe b/l pneumonia with ARDS with septic shock and multiorgan failure with severe Systolic CHF   PULMONARY A: Acute hypoxemic respiratory failure HCAP, NOS -possible aspiration ARDS P:   Cont full vent support -continue lung protection strategy.   Cont vent bundle Daily SBT if/when meets criteria  CARDIOVASCULAR A:  Septic shock,  still on Vasopressin and Phenylephrine Paroxysmal atrial fibrillation with RVR-converted with amiodarone 04/11; Amiodarone D/C last evening because of sinus bradycardia P:  MAP goal > 65 mmHg Continue on phenylephrine, vasopressin Wean norepinephrine first due to AFRVR Continue empiric hydrocortisone Echocardiogram-very  poor LVF, EF 20-25%  RENAL A:   AKI, oliguric.  Creatinine slightly worse today but UO adequate P:   Monitor BMET intermittently Monitor I/Os Correct electrolytes as indicated   GASTROINTESTINAL A:   Abdominal distention, improved  P:   SUP: IV PPI  TF protocol but keep rate at trickle  HEMATOLOGIC A:    Thrombocytopenia Leukopenia P:  DVT px: SCD Heparin D/C because of low Plts Monitor CBC intermittently Transfuse per usual guidelines   INFECTIOUS A:   Severe sepsis with shock Community acquired pneumonia Apyrexial today Elevated PCT P:   Monitor temp, WBC count Micro and abx as above  Continue cooling blanket  ENDOCRINE A:   Type 2 diabetes Mild steroid and stress induced hyperglycemia P:   Initiate SSI, moderate scale  NEUROLOGIC A:   Recurrent Ventilator dyssynchrony,   ICU/ventilator associated discomfort Agitation today even on Fentanyl and Propofol  drips History of schizophrenia History of TBI P:   RASS goal: -2, -3 Continue fentanyl and propofol    Critical Care Time devoted to patient care services described in this note is 35 minutes.   Overall, patient is critically ill, prognosis is guarded.  Patient with Multiorgan failure and at high risk for cardiac arrest and death.    Lucie Leather, M.D.  [facility]  Medical Director [facility] Medical Director [facility] Cardio-Pulmonary Department

## 2018-01-13 NOTE — Progress Notes (Signed)
`   Pharmacy Antibiotic Note  Tommy Mathews is a 57 y.o. male admitted on 01/06/2018 with worsening respiratory failure requiring mechanical ventialtion. Patient with presumed aspiration event and positive MRSA PCR. Patient transitioned from Unasyn to Cefepime/Metronidazole during am rounds on 4/10. Pharmacy has been consulted for Cefepime and Vancomycin dosing.  Plan: Per am rounds will complete all antibiotics after today, 4/15.    Height: 5\' 10"  (177.8 cm) Weight: 286 lb 13.1 oz (130.1 kg) IBW/kg (Calculated) : 73  Temp (24hrs), Avg:97.4 F (36.3 C), Min:96.6 F (35.9 C), Max:97.9 F (36.6 C)  Recent Labs  Lab 01/06/18 1931  01/07/18 0039  01/07/18 0601 01/07/18 0901 01/08/18 0424 01/09/18 0626  01/10/18 0747 01/11/18 0356 01/11/18 0358 01/11/18 1107 01/12/18 0438 01/13/18 0337 01/13/18 0754  WBC 8.3  --   --   --  0.8*  --  1.5*  --   --  5.3  --  3.9  --  3.4* 3.9  --   CREATININE 3.46*   < >  --    < >  --   --  2.21* 1.73*  --  1.77* 1.38*  --   --  1.40* 1.31*  --   LATICACIDVEN 6.7*  --  4.3*  --  4.7* 5.1*  --   --   --   --   --   --   --   --   --   --   VANCOTROUGH  --   --   --   --   --   --   --   --    < >  --   --   --  23*  --   --  14*  VANCORANDOM  --   --   --   --   --   --  11  --   --   --   --   --   --   --   --   --    < > = values in this interval not displayed.    Estimated Creatinine Clearance: 86.3 mL/min (A) (by C-G formula based on SCr of 1.31 mg/dL (H)).    Not on File  Antimicrobials this admission: Cefepime 4/8 x 1 Vancomycin 4/8 >> 4/15 Unasyn 4/9 >> 4/10 Cefepime 4/10 >> 4/15  Metronidazole 4/10 >> 4/15  Dose adjustments this admission: 4/10 Vancomycin changed to 1g IV Q12hr. 4/11  Vanc t= 22.  Dose chg from 1 gm q12h to 750 q12h   4/13 Vanc trough: 23; Dose changed to 1gm Q24H (est Cm: 1215mcg/ml)  Microbiology results: 4/8 BCx x 1:no growth x 4 days 4/9 BCx x 1: no growth x 3 days  4/8 UCx: 10K Staph Species  4/9 Tracheal  Aspirate: normal flora  4/8 MRSA PCR: positive   Thank you for allowing pharmacy to be a part of this patient's care.  Simpson,Michael L 01/13/2018 3:58 PM

## 2018-01-13 NOTE — Progress Notes (Signed)
Pharmacy Electrolyte Monitoring Consult:  Pharmacy consulted to assist in monitoring and replacing electrolytes in this 57 y.o. male admitted on 01/06/2018 with Respiratory Distress  Patient currently requiring mechanical ventilation and multiple pressors.   Labs:  Sodium (mmol/L)  Date Value  01/13/2018 141   Potassium (mmol/L)  Date Value  01/13/2018 3.4 (L)   Magnesium (mg/dL)  Date Value  16/10/960404/15/2019 1.9   Phosphorus (mg/dL)  Date Value  54/09/811904/15/2019 2.2 (L)   Calcium (mg/dL)  Date Value  14/78/295604/15/2019 7.1 (L)   Albumin (g/dL)  Date Value  21/30/865704/15/2019 2.2 (L)   Corrected Calcium: 8.9  Goal: K > 4.0, Mg > 2.0  Plan: Will give KCl 40mEq PO x 1 and potassium phosphate packet x 1.   Recheck electrolytes with AM labs   Simpson,Michael L 01/13/2018 3:57 PM

## 2018-01-14 LAB — GLUCOSE, CAPILLARY
GLUCOSE-CAPILLARY: 133 mg/dL — AB (ref 65–99)
GLUCOSE-CAPILLARY: 181 mg/dL — AB (ref 65–99)
GLUCOSE-CAPILLARY: 211 mg/dL — AB (ref 65–99)
Glucose-Capillary: 115 mg/dL — ABNORMAL HIGH (ref 65–99)
Glucose-Capillary: 230 mg/dL — ABNORMAL HIGH (ref 65–99)
Glucose-Capillary: 240 mg/dL — ABNORMAL HIGH (ref 65–99)
Glucose-Capillary: 212 mg/dL — ABNORMAL HIGH (ref 65–99)

## 2018-01-14 LAB — CBC WITH DIFFERENTIAL/PLATELET
BASOS ABS: 0 10*3/uL (ref 0–0.1)
BASOS PCT: 0 %
EOS ABS: 0.3 10*3/uL (ref 0–0.7)
EOS PCT: 6 %
HCT: 30.7 % — ABNORMAL LOW (ref 40.0–52.0)
Hemoglobin: 10.1 g/dL — ABNORMAL LOW (ref 13.0–18.0)
Lymphocytes Relative: 23 %
Lymphs Abs: 1.3 10*3/uL (ref 1.0–3.6)
MCH: 27.7 pg (ref 26.0–34.0)
MCHC: 32.8 g/dL (ref 32.0–36.0)
MCV: 84.4 fL (ref 80.0–100.0)
Monocytes Absolute: 0.5 10*3/uL (ref 0.2–1.0)
Monocytes Relative: 9 %
Neutro Abs: 3.4 10*3/uL (ref 1.4–6.5)
Neutrophils Relative %: 62 %
PLATELETS: 130 10*3/uL — AB (ref 150–440)
RBC: 3.64 MIL/uL — AB (ref 4.40–5.90)
RDW: 18.3 % — ABNORMAL HIGH (ref 11.5–14.5)
WBC: 5.6 10*3/uL (ref 3.8–10.6)

## 2018-01-14 LAB — BASIC METABOLIC PANEL
Anion gap: 3 — ABNORMAL LOW (ref 5–15)
BUN: 55 mg/dL — ABNORMAL HIGH (ref 6–20)
CALCIUM: 7.7 mg/dL — AB (ref 8.9–10.3)
CO2: 29 mmol/L (ref 22–32)
CREATININE: 1.1 mg/dL (ref 0.61–1.24)
Chloride: 111 mmol/L (ref 101–111)
GFR calc Af Amer: >60 mL/min (ref >60)
Glucose, Bld: 155 mg/dL — ABNORMAL HIGH (ref 65–99)
Potassium: 3.4 mmol/L — ABNORMAL LOW (ref 3.5–5.1)
SODIUM: 143 mmol/L (ref 135–145)

## 2018-01-14 MED ORDER — ATORVASTATIN CALCIUM 20 MG PO TABS
80.0000 mg | ORAL_TABLET | Freq: Every day | ORAL | Status: DC
  Administered 2018-01-14 – 2018-01-19 (×6): 80 mg
  Filled 2018-01-14 (×6): qty 4

## 2018-01-14 MED ORDER — FLUOXETINE HCL 20 MG/5ML PO SOLN
40.0000 mg | Freq: Every day | ORAL | Status: DC
  Administered 2018-01-14 – 2018-01-18 (×5): 40 mg
  Filled 2018-01-14 (×6): qty 10

## 2018-01-14 MED ORDER — AMIODARONE LOAD VIA INFUSION
150.0000 mg | Freq: Once | INTRAVENOUS | Status: AC
  Administered 2018-01-14: 150 mg via INTRAVENOUS
  Filled 2018-01-14: qty 83.34

## 2018-01-14 MED ORDER — BUDESONIDE 0.5 MG/2ML IN SUSP
0.5000 mg | Freq: Two times a day (BID) | RESPIRATORY_TRACT | Status: DC
  Administered 2018-01-14 – 2018-02-11 (×57): 0.5 mg via RESPIRATORY_TRACT
  Filled 2018-01-14 (×59): qty 2

## 2018-01-14 MED ORDER — FOLIC ACID 1 MG PO TABS
0.5000 mg | ORAL_TABLET | Freq: Every day | ORAL | Status: DC
  Administered 2018-01-14 – 2018-01-19 (×6): 0.5 mg
  Filled 2018-01-14 (×6): qty 1

## 2018-01-14 MED ORDER — VALPROATE SODIUM 250 MG/5ML PO SOLN
500.0000 mg | Freq: Two times a day (BID) | ORAL | Status: DC
  Administered 2018-01-14 – 2018-01-21 (×16): 500 mg
  Filled 2018-01-14 (×18): qty 10

## 2018-01-14 MED ORDER — QUETIAPINE FUMARATE 200 MG PO TABS: 300.0000 mg | ORAL_TABLET | Freq: Two times a day (BID) | ORAL | Status: DC

## 2018-01-14 MED ORDER — POTASSIUM CHLORIDE 20 MEQ PO PACK
40.0000 meq | PACK | Freq: Once | ORAL | Status: AC
Start: 2018-01-14 — End: 2018-01-14
  Administered 2018-01-14: 40 meq via NASOGASTRIC
  Filled 2018-01-14: qty 2

## 2018-01-14 MED ORDER — FUROSEMIDE 10 MG/ML IJ SOLN
80.0000 mg | Freq: Once | INTRAMUSCULAR | Status: AC
  Administered 2018-01-14: 80 mg via INTRAVENOUS
  Filled 2018-01-14: qty 8

## 2018-01-14 MED ORDER — ENOXAPARIN SODIUM 40 MG/0.4ML ~~LOC~~ SOLN
40.0000 mg | Freq: Every day | SUBCUTANEOUS | Status: DC
  Administered 2018-01-14 – 2018-01-20 (×7): 40 mg via SUBCUTANEOUS
  Filled 2018-01-14 (×6): qty 0.4

## 2018-01-14 NOTE — Consult Note (Signed)
Cardiology Consultation Note    Patient ID: Tommy Mathews, MRN: [id number], DOB/AGE: 1961-04-12 57 y.o. Admit date: 01/06/2018   Date of Consult: 01/14/2018 Primary Physician: Patient, [name] Primary Cardiologist:    Chief Complaint: afib  Reason for Consultation: afib Requesting MD: Pt is a 57 yo male resident of [facility] due to psychiatric disorder admitted on 01/06/18 with increasing sob and was noted to have coffee ground material> Noted to have intermitant afib with rvr and nsr.   HPI: Tommy Mathews is a 57 y.o. male with history of afib treated with aparent anticoagulation with xarelto. Pt is intubated and not able to give history. He was noted to have multifocal pna, hypertension and uti on admission. He was taken off of xarelto. He was also apparently on depakoke, prozac, hctz, prinivil, metoprolol and seroquel. He was placed on amiodarone for afib with rvr. His respiratoy failure required intubation. He has required vasopressors to keep map stable due to shock. Marland Kitchen He was seen by nephrology for arf. Was seen by gi and patient has no further melena or coffee ground gi output.  Telemetry reveals intermittent sinus rhythm and A. fib.  Past Medical History:  Diagnosis Date  . Diabetes ([place])   . GERD (gastroesophageal reflux disease)   . HLD (hyperlipidemia)   . HTN (hypertension)       Surgical History:   Home Meds: Prior to Admission medications   Medication Sig Start Date End Date Taking? Authorizing Provider  atorvastatin (LIPITOR) 80 MG tablet Take 80 mg by mouth at bedtime.   Yes [provider]  divalproex (DEPAKOTE ER) 500 MG 24 hr tablet Take 500 mg by mouth 2 (two) times daily.   Yes [provider]  estradiol (ESTRACE) 2 MG tablet Take 2 mg by mouth daily.   Yes [provider]  FLUoxetine (PROZAC) 40 MG capsule Take 40 mg by mouth daily.   Yes [provider]  folic acid (FOLVITE) 583 MCG tablet Take 800 mcg by mouth daily.   Yes [provider]  hydrochlorothiazide (HYDRODIURIL) 50 MG tablet Take 50 mg by mouth daily.   Yes [provider]  lisinopril (PRINIVIL,ZESTRIL) 5 MG tablet Take 5 mg by mouth daily.   Yes [provider]  metFORMIN (GLUCOPHAGE) 1000 MG tablet Take 1,000 mg by mouth 2 (two) times daily with a meal.   Yes [provider]  methotrexate (RHEUMATREX) 2.5 MG tablet Take 15 mg by mouth once a week.   Yes [provider]  metoprolol tartrate (LOPRESSOR) 50 MG tablet Take 50 mg by mouth 2 (two) times daily.   Yes [provider]  Omega-3 Fatty Acids (FISH OIL) 1000 MG CAPS Take 1,000 mg by mouth daily.   Yes [provider]  QUEtiapine Fumarate (SEROQUEL PO) Take 500 mg by mouth 2 (two) times daily.   Yes [provider]  ranitidine (ZANTAC) 150 MG tablet Take 150 mg by mouth daily.   Yes [provider]  rivaroxaban (XARELTO) 10 MG TABS tablet Take 10 mg by mouth daily.   Yes [provider]  senna-docusate (SENOKOT-S) 8.6-50 MG tablet Take 2 tablets by mouth at bedtime.   Yes [provider]  sodium chloride 1 g tablet Take 1 g by mouth daily.   Yes [provider]    Inpatient Medications:  . artificial tears  1 application Both Eyes E9M  . budesonide (PULMICORT) nebulizer solution  0.5 mg Nebulization BID  . chlorhexidine  gluconate (MEDLINE KIT)  15 mL Mouth Rinse BID  . enoxaparin (LOVENOX) injection  40 mg Subcutaneous Daily  . feeding supplement (PRO-STAT SUGAR FREE 64)  60 mL Per Tube BID  . furosemide  80 mg Intravenous Once  . hydrocortisone sod succinate (SOLU-CORTEF) inj  50 mg Intravenous BH-[id number]  . insulin aspart  0-15 Units Subcutaneous Q4H  . insulin glargine  15 Units Subcutaneous QHS  . ipratropium-albuterol  3 mL Nebulization Q4H  . mouth rinse  15 mL Mouth Rinse 10 times per day  . multivitamin  15 mL Per Tube Daily  . pantoprazole sodium  40 mg Per Tube Daily   . sodium chloride  10 mL/hr at 01/12/18 0800  . amiodarone 60 mg/hr (01/14/18 0600)  . dexmedetomidine (PRECEDEX) IV infusion 0.9 mcg/kg/hr (01/14/18 7253)  . feeding supplement (VITAL HIGH PROTEIN) 45 mL/hr at 01/14/18 0600  . fentaNYL 350 mcg/hr (01/14/18 6644)    Allergies: Not on File  Social History   Socioeconomic History  . Marital status: Unknown    Spouse name: Not on file  . Number of children: Not on file  . Years of education: Not on file  . Highest education level: Not on file  Occupational History  . Not on file  Social Needs  . Financial resource strain: Not on file  . Food insecurity:    Worry: Not on file    Inability: Not on file  . Transportation needs:    Medical: Not on file    Non-medical: Not on file  Tobacco Use  . Smoking status: Unknown If Ever Smoked  Substance and Sexual Activity  . Alcohol use: Not on file  . Drug use: Not on file  . Sexual activity: Not on file  Lifestyle  . Physical activity:    Days per week: Not on file    Minutes per session: Not on file  . Stress: Not on file  Relationships  . Social connections:    Talks on phone: Not on file    Gets together: Not on file    Attends religious service: Not on file    Active member of club or organization: Not on file    Attends meetings of clubs or organizations: Not on file    Relationship status: Not on file  . Intimate partner violence:    Fear of current or ex partner: Not on file    Emotionally abused: Not on file    Physically abused: Not on file    Forced sexual activity: Not on file  Other Topics Concern  . Not on file  Social History Narrative  . Not on file     No family history on file.   Review of Systems: A 12-system review of systems was performed and is negative except as noted in the HPI.  Labs: No results for input(s): CKTOTAL, CKMB, TROPONINI in the last 72 hours. Lab Results  Component Value Date   WBC 5.6 01/14/2018   HGB 10.1 (L) 01/14/2018   HCT 30.7 (L)  01/14/2018   MCV 84.4 01/14/2018   PLT 130 (L) 01/14/2018    Recent Labs  Lab 01/10/18 0747  01/14/18 0355  NA 139   < > 143  K 3.7   < > 3.4*  CL 107   < > 111  CO2 24   < > 29  BUN 53*   < > 55*  CREATININE 1.77*   < > 1.10  CALCIUM 5.6*   < >  7.7*  PROT 5.4*  --   --   BILITOT 0.6  --   --   ALKPHOS 52  --   --   ALT 28  --   --   AST 45*  --   --   GLUCOSE 206*   < > 155*   < > = values in this interval not displayed.   Lab Results  Component Value Date   TRIG 76 01/11/2018   No results found for: DDIMER  Radiology/Studies:  Ct Abdomen Pelvis Wo Contrast  Result Date: 01/06/2018 CLINICAL DATA:  Respiratory arrest.  Suspect pulmonary embolism. EXAM: CT CHEST, ABDOMEN AND PELVIS WITHOUT CONTRAST TECHNIQUE: Multidetector CT imaging of the chest, abdomen and pelvis was performed following the standard protocol without IV contrast. COMPARISON:  Chest radiograph January 06, 2018 FINDINGS: CT CHEST FINDINGS CARDIOVASCULAR: Heart and pericardium are unremarkable. Thoracic aorta is normal course and caliber, mild calcific atherosclerosis. MEDIASTINUM/NODES: No mediastinal mass. No lymphadenopathy by CT size criteria limited assessment without contrast. Normal appearance of thoracic esophagus though not tailored for evaluation. LUNGS/PLEURA: Tracheobronchial tree is patent, no pneumothorax. Endotracheal tube tip above the carina. Bilateral tree-in-bud infiltrates and dense consolidation with air bronchograms. RIGHT upper lobe collapse with soft tissue effacing RIGHT upper lobe bronchus. MUSCULOSKELETAL: Included soft tissues and included osseous structures appear normal. CT ABDOMEN PELVIS FINDINGS HEPATOBILIARY: Subcentimeter layering gallstones without CT findings of acute cholecystitis by noncontrast CT. Trace perihepatic focal fat. PANCREAS: Normal. SPLEEN: Normal. ADRENALS/URINARY TRACT: Kidneys are orthotopic, demonstrating normal size and morphology. No nephrolithiasis, hydronephrosis;  limited assessment for renal masses on this nonenhanced examination. The unopacified ureters are normal in course and caliber. Urinary bladder decompressed by Foley catheter. Normal adrenal glands. STOMACH/BOWEL: Nasogastric tube terminates in distal stomach. Gas distended small bowel measuring to 3.8 cm with multiple transition points in air-fluid levels. Large bowel normal in course and caliber. Normal appendix. VASCULAR/LYMPHATIC: Mildly ectatic 2.4 cm infrarenal aorta. Mild calcific atherosclerosis. No lymphadenopathy by CT size criteria. REPRODUCTIVE: Normal. OTHER: No intraperitoneal free fluid or free air. MUSCULOSKELETAL: Non-acute. Small fat containing LEFT inguinal hernia. Mild L5-S1 degenerative disc. IMPRESSION: CT CHEST: 1. Pulmonary embolism cannot be excluded by noncontrast CT. 2. Multifocal severe pneumonia with RIGHT upper lobe collapse and soft tissue obstructing RIGHT upper lobe bronchus. Consider bronchoscopy. 3. Endotracheal tube tip above the carina. CT ABDOMEN AND PELVIS: 1. Mild small bowel ileus, potential enteritis. Nasogastric tube terminates in distal stomach. 2. Cholelithiasis without CT findings of acute cholecystitis. Aortic Atherosclerosis (ICD10-I70.0). Electronically Signed   By: Elon Alas M.D.   On: 01/06/2018 21:44   Dg Abd 1 View  Result Date: 01/13/2018 CLINICAL DATA:  Orogastric tube placement EXAM: ABDOMEN - 1 VIEW COMPARISON:  CT 01/06/2018 FINDINGS: Consolidation at the left lung base. Esophageal tube tip projects over the proximal stomach. Air-filled dilated bowel in the upper abdomen. IMPRESSION: Esophageal tube tip overlies the proximal stomach. There is consolidation at the left lung base. Electronically Signed   By: Donavan Foil M.D.   On: 01/13/2018 21:32   Ct Chest Wo Contrast  Result Date: 01/06/2018 CLINICAL DATA:  Respiratory arrest.  Suspect pulmonary embolism. EXAM: CT CHEST, ABDOMEN AND PELVIS WITHOUT CONTRAST TECHNIQUE: Multidetector CT imaging  of the chest, abdomen and pelvis was performed following the standard protocol without IV contrast. COMPARISON:  Chest radiograph January 06, 2018 FINDINGS: CT CHEST FINDINGS CARDIOVASCULAR: Heart and pericardium are unremarkable. Thoracic aorta is normal course and caliber, mild calcific atherosclerosis. MEDIASTINUM/NODES: No mediastinal mass. No lymphadenopathy  by CT size criteria limited assessment without contrast. Normal appearance of thoracic esophagus though not tailored for evaluation. LUNGS/PLEURA: Tracheobronchial tree is patent, no pneumothorax. Endotracheal tube tip above the carina. Bilateral tree-in-bud infiltrates and dense consolidation with air bronchograms. RIGHT upper lobe collapse with soft tissue effacing RIGHT upper lobe bronchus. MUSCULOSKELETAL: Included soft tissues and included osseous structures appear normal. CT ABDOMEN PELVIS FINDINGS HEPATOBILIARY: Subcentimeter layering gallstones without CT findings of acute cholecystitis by noncontrast CT. Trace perihepatic focal fat. PANCREAS: Normal. SPLEEN: Normal. ADRENALS/URINARY TRACT: Kidneys are orthotopic, demonstrating normal size and morphology. No nephrolithiasis, hydronephrosis; limited assessment for renal masses on this nonenhanced examination. The unopacified ureters are normal in course and caliber. Urinary bladder decompressed by Foley catheter. Normal adrenal glands. STOMACH/BOWEL: Nasogastric tube terminates in distal stomach. Gas distended small bowel measuring to 3.8 cm with multiple transition points in air-fluid levels. Large bowel normal in course and caliber. Normal appendix. VASCULAR/LYMPHATIC: Mildly ectatic 2.4 cm infrarenal aorta. Mild calcific atherosclerosis. No lymphadenopathy by CT size criteria. REPRODUCTIVE: Normal. OTHER: No intraperitoneal free fluid or free air. MUSCULOSKELETAL: Non-acute. Small fat containing LEFT inguinal hernia. Mild L5-S1 degenerative disc. IMPRESSION: CT CHEST: 1. Pulmonary embolism cannot be  excluded by noncontrast CT. 2. Multifocal severe pneumonia with RIGHT upper lobe collapse and soft tissue obstructing RIGHT upper lobe bronchus. Consider bronchoscopy. 3. Endotracheal tube tip above the carina. CT ABDOMEN AND PELVIS: 1. Mild small bowel ileus, potential enteritis. Nasogastric tube terminates in distal stomach. 2. Cholelithiasis without CT findings of acute cholecystitis. Aortic Atherosclerosis (ICD10-I70.0). Electronically Signed   By: Elon Alas M.D.   On: 01/06/2018 21:44   Dg Chest Port 1 View  Result Date: 01/13/2018 CLINICAL DATA:  Respiratory failure. EXAM: PORTABLE CHEST 1 VIEW COMPARISON:  One-view chest x-ray 01/12/2018. FINDINGS: Heart size is exaggerate by low lung volumes. Endotracheal tube is stable. Left greater than right interstitial and airspace disease is similar the prior exam. Overall lung volumes are slightly improved. IMPRESSION: 1. Stable appearance of left greater than right interstitial and airspace disease, likely a combination of edema and possible infection. 2. Support apparatus is stable. 3. Slight improvement in lung volumes. Electronically Signed   By: San Morelle M.D.   On: 01/13/2018 07:49   Dg Chest Port 1 View  Result Date: 01/12/2018 CLINICAL DATA:  Acute respiratory failure with hypoxia. Severe sepsis with septic shock. On ventilator. EXAM: PORTABLE CHEST 1 VIEW COMPARISON:  01/11/2018 FINDINGS: Endotracheal tube and nasogastric tube remain in appropriate position. Heart size remains within normal limits. Low lung volumes again seen with diffuse interstitial infiltrates. No evidence of focal consolidation or significant pleural effusion. IMPRESSION: Stable low lung volumes with diffuse interstitial infiltrates/edema. Electronically Signed   By: Earle Gell M.D.   On: 01/12/2018 07:37   Dg Chest Port 1 View  Result Date: 01/11/2018 CLINICAL DATA:  Respiratory failure EXAM: PORTABLE CHEST 1 VIEW COMPARISON:  January 10, 2018 FINDINGS: The  ETT terminates 2.8 cm above the carina. No pneumothorax. The cardiomediastinal silhouette is stable. Pulmonary edema has decreased but persists. No other changes. IMPRESSION: 1. Stable support apparatus. 2. Persistent but decreased pulmonary edema. 3. No other change. Electronically Signed   By: Dorise Bullion III M.D   On: 01/11/2018 07:01   Dg Chest Port 1 View  Result Date: 01/10/2018 CLINICAL DATA:  Respiratory failure EXAM: PORTABLE CHEST 1 VIEW COMPARISON:  01/08/2018 FINDINGS: Cardiac shadow is stable. Endotracheal tube and nasogastric catheter are again noted in satisfactory position. Bilateral infiltrates are again identified  and stable. No new focal abnormality is seen. No bony abnormality is noted. IMPRESSION: Stable bilateral infiltrates. Electronically Signed   By: Inez Catalina M.D.   On: 01/10/2018 07:24   Dg Chest Port 1 View  Result Date: 01/08/2018 CLINICAL DATA:  Respiratory failure EXAM: PORTABLE CHEST 1 VIEW COMPARISON:  Portable exam 1003 hours compared to 01/07/2018 FINDINGS: Tip of endotracheal tube projects 4.4 cm above carina. Nasogastric tube extends into abdomen. Enlargement of cardiac silhouette. Diffuse BILATERAL airspace infiltrates increased in upper lobes since previous exam. No gross pleural effusion or pneumothorax. IMPRESSION: Increased BILATERAL airspace infiltrates. Electronically Signed   By: Lavonia Dana M.D.   On: 01/08/2018 10:33   Dg Chest Port 1 View  Result Date: 01/07/2018 CLINICAL DATA:  Acute respiratory failure.  Hypoxia. EXAM: PORTABLE CHEST 1 VIEW COMPARISON:  CT 6 hours prior, radiographs 8 hours prior. FINDINGS: Endotracheal tube 3.9 cm from the carina. Enteric tube in place with tip below the diaphragm, not included in the field of view. Progressive bilateral lung opacities, greatest in the lung bases with air bronchograms. Heart appears normal in size. No large pleural effusion. No pneumothorax. IMPRESSION: Progressive bilateral lung opacities,  greatest in the lung bases, may reflect pneumonia or aspiration. Pulmonary edema is felt less likely. Electronically Signed   By: Jeb Levering M.D.   On: 01/07/2018 04:01   Dg Chest Portable 1 View  Result Date: 01/06/2018 CLINICAL DATA:  ETT placement EXAM: PORTABLE CHEST 1 VIEW COMPARISON:  None. FINDINGS: Endotracheal tube tip is about 2.4 cm superior to the carina. Low lung volumes. No pleural effusion or pneumothorax. Heart size within normal limits. Streaky left greater than right hilar opacity. IMPRESSION: 1. Endotracheal tube tip about 2.4 cm superior to the carina 2. Low lung volumes. Streaky left greater than right perihilar opacity of uncertain chronicity. Electronically Signed   By: Donavan Foil M.D.   On: 01/06/2018 19:35   [place] Ekg Site Rite  Result Date: 01/13/2018 If Site Rite image not attached, placement could not be confirmed due to current cardiac rhythm.   Wt Readings from Last 3 Encounters:  01/13/18 130.1 kg (286 lb 13.1 oz)    EKG: Atrial fibrillation with intermittent sinus rhythm  Physical Exam: Blood pressure 127/82, pulse 92, temperature (!) 97 F (36.1 C), resp. rate (!) 30, height _0  (1.778 m), weight 130.1 kg (286 lb 13.1 oz), SpO2 94 %. Body mass index is 41.15 kg/m. General: Well developed, well nourished, in no acute distress. Head: Normocephalic, atraumatic, sclera non-icteric, no xanthomas, nares are without discharge.  Neck: Negative for carotid bruits. JVD not elevated. Lungs: Clear bilaterally to auscultation without wheezes, rales, or rhonchi. Breathing is unlabored. Heart: RRR with intermittent very regularly irregular rhythm with S1 S2. No murmurs, rubs, or gallops appreciated. Abdomen: Soft, non-tender, non-distended with normoactive bowel sounds. No hepatomegaly. No rebound/guarding. No obvious abdominal masses. Msk:  Strength and tone appear normal for age. Extremities: No clubbing or cyanosis. No edema.  Distal pedal pulses are 2+ and  equal bilaterally. Neuro: Intubated and not responsive.      Assessment and Plan  57 year old male with history of psychosis, history of atrial fibrillation now admitted with respiratory failure and been noted to have intermittent atrial fibrillation with rapid ventricular response.  Currently is intubated not able to give a history.  Review of telemetry reveals intermittent atrial fibrillation with rapid ventricular response and sinus rhythm.  Patient is currently on amiodarone drip.  We will continue with this  for now.  He is on empiric antibiotics.  He remains somewhat hypotensive requiring pressors.  Atrial fibrillation-would continue with amiodarone at present and attempt to maintain sinus rhythm.  Will follow for bradycardia however at present is stable.  Not a candidate for chronic anticoagulation at present until GI bleeding stability can be determined  Sepsis-being treated empirically.  Cardiomyopathy-Echo showed EF 20-25%.  Not able to be on beta-blockers or afterload reduction at present.  This will need to be discussed after stability hemodynamically is achieved.  Signed, Teodoro Spray MD 01/14/2018, 10:30 AM Pager: [phone]) [phone]

## 2018-01-14 NOTE — Progress Notes (Signed)
Inpatient Diabetes Program Recommendations  AACE/ADA: New Consensus Statement on Inpatient Glycemic Control (2015)  Target Ranges:  Prepandial:   less than 140 mg/dL      Peak postprandial:   less than 180 mg/dL (1-2 hours)      Critically ill patients:  140 - 180 mg/dL   Lab Results  Component Value Date   GLUCAP 230 (H) 01/14/2018    Review of Glycemic ControlResults for [name], Tommy Mathews (MRN [id number]) as of 01/14/2018 11:50  Ref. Range 01/14/2018 00:53 01/14/2018 03:52 01/14/2018 07:40 01/14/2018 11:30  Glucose-Capillary Latest Ref Range: 65 - 99 mg/dL 409133 (H) 811115 (H) 914211 (H) 230 (H)    Diabetes history: Type 2 DM Outpatient Diabetes medications: Metformin 1000 mg bid Current orders for Inpatient glycemic control:  Novolog moderate q 4 hours, Lantus 15 units q HS  Inpatient Diabetes Program Recommendations:    If blood sugars remain > 180 mg/dL, consider adding Novolog tube feed coverage 3 units q 4 hours to cover carbohydrate in tube feeds.    Thanks,  Beryl MeagerJenny Hector Taft, RN, BC-ADM Inpatient Diabetes Coordinator Pager [phone] (8a-5p)

## 2018-01-14 NOTE — Progress Notes (Signed)
   01/14/18 1305  Clinical Encounter Type  Visited With Patient  Visit Type Initial  Spiritual Encounters  Spiritual Needs Prayer   Chaplain introduced self aloud and offered silent prayer.

## 2018-01-14 NOTE — Progress Notes (Signed)
Per Dr Lady GaryFath if patients heart rate gets above 160 and sustains verbal order to give 150 mg bolus of amiodorone once.

## 2018-01-14 NOTE — Progress Notes (Signed)
PULMONARY / CRITICAL CARE MEDICINE   Name: Tommy PangJames Diep MRN: [id number] DOB: 03-Dec-1960    ADMISSION DATE:  01/06/2018   PT PROFILE:  [age] M SNF resident due to psychiatric illness and history of TBI admitted with severe bilateral pneumonia, severe sepsis/septic shock.  Intubated in the ED shortly after arrival.  Was also noted to have dark coffee-ground material suctioned from stomach.  Patient chronically on rivaroxaban.   MAJOR EVENTS/TEST RESULTS: 04/08 Admission as above 04/08 CT chest: Multifocal severe pneumonia with RUL collapse and soft tissue obstructing RUL bronchus. 04/08 CTAP: Mild small bowel ileus, potential enteritis. Nasogastric tube terminates in distal stomach. Cholelithiasis without CT findings of acute cholecystitis.Consider bronchoscopy.  04/09 nephrology consultation: No acute indication for dialysis at present. 04/09 gastroenterology consultation: Recommend high-dose PPI therapy. 04/10 severely hypoxemic despite 100% FiO2.  Recruitment maneuver with significant improvement.  PEEP increased and ARDS strategy on ventilator implemented.  Neuromuscular blockade implemented due to ventilator dyssynchrony. 04/11 PAF with RVR > converted with amiodarone. Gas exchange improving. Off Nimbex 04/13 High airway pressure with vent dyssynchony; given Vecuronium; FIO2 increased to 50%  4/13 Amiodarone D/C because of SB 4/15 increased HR, EF 20% afib with RVR  INDWELLING DEVICES:: ETT 04/08 >>  L femoral CVL 04/08 >> 4/16 PICC RT ARM 4/15  MICRO DATA: MRSA PCR 04/08 >> POS Urine 04/08 >> 10k coag negative staph Resp 04/09 >> c/w NOF Blood 04/08 >>  Blood 04/09 >>   ANTIMICROBIALS:  Unasyn 04/08 >> 04/10 Vanc 04/08 >> 4/15 Cefepime 04/10 >> 4/15 Metronidazole 04/10 >> 4/15   SUBJECTIVE:  Patient remains intubated,sedated Remains on high vent support fio2 at 50% Remains critically ill     VITAL SIGNS: BP 127/82   Pulse 92   Temp (!) 97 F (36.1 C)   Resp (!) 30    Ht 5\' 10"  (1.778 m)   Wt 286 lb 13.1 oz (130.1 kg)   SpO2 94%   BMI 41.15 kg/m    VENTILATOR SETTINGS: Vent Mode: PRVC FiO2 (%):  [40 %-50 %] 45 % Set Rate:  [30 bmp] 30 bmp Vt Set:  [400 mL] 400 mL PEEP:  [10 cmH20] 10 cmH20 Plateau Pressure:  [26 cmH20-28 cmH20] 26 cmH20  INTAKE / OUTPUT: I/O last 3 completed shifts: In: 3311 [I.V.:2159.8; NG/GT:1151.3] Out: 4475 [Urine:4475]  PHYSICAL EXAMINATION: General: RASS -4, -5. Sedated, synchronous with ventilator Neuro: PERRLA, EOMI, DTRs symmetric, no spontaneous movement HEENT: PERRL, sclerae white Cardiovascular: RRR, no M Lungs: bilateral rhonchi anteriorly, no wheezes Abdomen: Mildly distended, bowel sounds present Extremities: Warm, 2+ edema upper > lower Skin: No lesions noted  LABS:  BMET Recent Labs  Lab 01/12/18 0438 01/13/18 0337 01/14/18 0355  NA 141 141 143  K 3.8 3.4* 3.4*  CL 110 108 111  CO2 25 27 29   BUN 63* 63* 55*  CREATININE 1.40* 1.31* 1.10  GLUCOSE 225* 189* 155*    Electrolytes Recent Labs  Lab 01/11/18 0356 01/12/18 0438 01/13/18 0337 01/14/18 0355  CALCIUM 6.8* 7.1* 7.1* 7.7*  MG 1.9 1.8 1.9  --   PHOS 1.9*  2.0* 2.9 2.2*  --     CBC Recent Labs  Lab 01/12/18 0438 01/13/18 0337 01/14/18 0355  WBC 3.4* 3.9 5.6  HGB 10.1* 9.8* 10.1*  HCT 30.4* 29.9* 30.7*  PLT 69* 91* 130*    Coag's No results for input(s): APTT, INR in the last 168 hours.  Sepsis Markers Recent Labs  Lab 01/09/18 0626 01/10/18 0747  PROCALCITON 27.48  16.68    ABG Recent Labs  Lab 01/11/18 0500 01/12/18 0506 01/13/18 0419  PHART 7.33* 7.29* 7.30*  PCO2ART 41 49* 54*  PO2ART 77* 83 89    Liver Enzymes Recent Labs  Lab 01/10/18 0747 01/11/18 0356 01/13/18 0337  AST 45*  --   --   ALT 28  --   --   ALKPHOS 52  --   --   BILITOT 0.6  --   --   ALBUMIN 1.9* 1.7* 2.2*    Cardiac Enzymes Recent Labs  Lab 01/07/18 1239  TROPONINI 0.03*    Glucose Recent Labs  Lab  01/13/18 1217 01/13/18 1608 01/13/18 1934 01/14/18 0053 01/14/18 0352 01/14/18 0740  GLUCAP 174* 186* 163* 133* 115* 211*    CXR: No new film  ASSESSMENT / PLAN: 57 yo AAM admitted for severe resp failure from severe b/l pneumonia with ARDS with septic shock and multiorgan failure with severe Systolic CHF   PULMONARY A: Acute hypoxemic respiratory failure HCAP, NOS -possible aspiration ARDS P:   Cont full vent support -continue lung protection strategy.   Cont vent bundle Daily SBT if/when meets criteria  CARDIOVASCULAR A:  Septic shock,  still on Vasopressin and Phenylephrine Paroxysmal atrial fibrillation with RVR-converted with amiodarone 04/11; Amiodarone D/C last evening because of sinus bradycardia P:  MAP goal > 65 mmHg Continue on phenylephrine, vasopressin Wean norepinephrine first due to AFRVR Continue empiric hydrocortisone Echocardiogram-very  poor LVF, EF 20-25%  RENAL A:   AKI, oliguric.  Creatinine slightly worse today but UO adequate P:   Monitor BMET intermittently Monitor I/Os Correct electrolytes as indicated  Lasix 80 x 1 today  GASTROINTESTINAL A:   Abdominal distention, improved  P:   SUP: IV PPI  TF protocol at goal  HEMATOLOGIC A:    Thrombocytopenia Leukopenia P:  DVT px: SCD Heparin-plt count improved Monitor CBC intermittently Transfuse per usual guidelines   INFECTIOUS A:   Severe sepsis with shock Community acquired pneumonia Apyrexial today Elevated PCT P:   Monitor temp, WBC count Micro and abx as above  Continue cooling blanket  ENDOCRINE A:   Type 2 diabetes Mild steroid and stress induced hyperglycemia P:   Initiate SSI, moderate scale  NEUROLOGIC A:   Recurrent Ventilator dyssynchrony,   ICU/ventilator associated discomfort Agitation today even on Fentanyl and Propofol drips History of schizophrenia History of TBI P:   RASS goal: -2, -3 Continue fentanyl and propofol    Critical Care Time  devoted to patient care services described in this note is 32 minutes.   Overall, patient is critically ill, prognosis is guarded.  Patient with Multiorgan failure and at high risk for cardiac arrest and death.    Lucie Leather, M.D.  [facility]  Medical Director [facility] Medical Director [facility] Cardio-Pulmonary Department

## 2018-01-14 NOTE — Progress Notes (Signed)
[employer] [facility] [place] [facility]   PATIENT NAME: Tommy Mathews    MR#:  [id number]  DATE OF BIRTH:  [birth year]  SUBJECTIVE:  CHIEF COMPLAINT:   Chief Complaint  Patient presents with  . Respiratory Distress  Remains critically ill  REVIEW OF SYSTEMS:  CONSTITUTIONAL: No fever, fatigue or weakness.  EYES: No blurred or double vision.  EARS, NOSE, AND THROAT: No tinnitus or ear pain.  RESPIRATORY: No cough, shortness of breath, wheezing or hemoptysis.  CARDIOVASCULAR: No chest pain, orthopnea, edema.  GASTROINTESTINAL: No nausea, vomiting, diarrhea or abdominal pain.  GENITOURINARY: No dysuria, hematuria.  ENDOCRINE: No polyuria, nocturia,  HEMATOLOGY: No anemia, easy bruising or bleeding SKIN: No rash or lesion. MUSCULOSKELETAL: No joint pain or arthritis.   NEUROLOGIC: No tingling, numbness, weakness.  PSYCHIATRY: No anxiety or depression.   ROS  DRUG ALLERGIES:  Not on File  VITALS:  Blood pressure 131/81, pulse (!) 111, temperature 98.1 F (36.7 C), resp. rate (!) 27, height 5\' 10"  (1.778 m), weight 130.1 kg (286 lb 13.1 oz), SpO2 97 %.  PHYSICAL EXAMINATION:  GENERAL:  57 y.o.-year-old patient lying in the bed with no acute distress.  EYES: Pupils equal, round, reactive to light and accommodation. No scleral icterus. Extraocular muscles intact.  HEENT: Head atraumatic, normocephalic. Oropharynx and nasopharynx clear.  NECK:  Supple, no jugular venous distention. No thyroid enlargement, no tenderness.  LUNGS: Normal breath sounds bilaterally, no wheezing, rales,rhonchi or crepitation. No use of accessory muscles of respiration.  CARDIOVASCULAR: S1, S2 normal. No murmurs, rubs, or gallops.  ABDOMEN: Soft, nontender, nondistended. Bowel sounds present. No organomegaly or mass.  EXTREMITIES: No pedal edema, cyanosis, or clubbing.  NEUROLOGIC: Cranial nerves II through XII are intact. Muscle strength 5/5 in all extremities. Sensation intact. Gait not  checked.  PSYCHIATRIC: The patient is alert and oriented x 3.  SKIN: No obvious rash, lesion, or ulcer.   Physical Exam LABORATORY PANEL:   CBC Recent Labs  Lab 01/14/18 0355  WBC 5.6  HGB 10.1*  HCT 30.7*  PLT 130*   ------------------------------------------------------------------------------------------------------------------  Chemistries  Recent Labs  Lab 01/10/18 0747  01/13/18 0337 01/14/18 0355  NA 139   < > 141 143  K 3.7   < > 3.4* 3.4*  CL 107   < > 108 111  CO2 24   < > 27 29  GLUCOSE 206*   < > 189* 155*  BUN 53*   < > 63* 55*  CREATININE 1.77*   < > 1.31* 1.10  CALCIUM 5.6*   < > 7.1* 7.7*  MG 1.5*   < > 1.9  --   AST 45*  --   --   --   ALT 28  --   --   --   ALKPHOS 52  --   --   --   BILITOT 0.6  --   --   --    < > = values in this interval not displayed.   ------------------------------------------------------------------------------------------------------------------  Cardiac Enzymes No results for input(s): TROPONINI in the last 168 hours. ------------------------------------------------------------------------------------------------------------------  RADIOLOGY:  Dg Abd 1 View  Result Date: 01/13/2018 CLINICAL DATA:  Orogastric tube placement EXAM: ABDOMEN - 1 VIEW COMPARISON:  CT 01/06/2018 FINDINGS: Consolidation at the left lung base. Esophageal tube tip projects over the proximal stomach. Air-filled dilated bowel in the upper abdomen. IMPRESSION: Esophageal tube tip overlies the proximal stomach. There is consolidation at the left lung base. Electronically Signed   By:  Jasmine PangKim  Fujinaga M.D.   On: 01/13/2018 21:32   Dg Chest Port 1 View  Result Date: 01/13/2018 CLINICAL DATA:  Respiratory failure. EXAM: PORTABLE CHEST 1 VIEW COMPARISON:  One-view chest x-ray 01/12/2018. FINDINGS: Heart size is exaggerate by low lung volumes. Endotracheal tube is stable. Left greater than right interstitial and airspace disease is similar the prior exam.  Overall lung volumes are slightly improved. IMPRESSION: 1. Stable appearance of left greater than right interstitial and airspace disease, likely a combination of edema and possible infection. 2. Support apparatus is stable. 3. Slight improvement in lung volumes. Electronically Signed   By: Marin Robertshristopher  Mattern M.D.   On: 01/13/2018 07:49   [place] Ekg Site Rite  Result Date: 01/13/2018 If Site Rite image not attached, placement could not be confirmed due to current cardiac rhythm.   ASSESSMENT AND PLAN:  1 acute severe septic shock Resolving Due to his pneumonia and UTI Continue empiric cefepime/vancomycin/Flagyl, vasopressor support weaned off,IV fluids for rehydration  Pro-calcitonin was 27.48 Echocardiogram noted for ejection fraction 20-25% -prognosis poor  2 acutepneumonia/UTI Continue pneumonia protocol, antibiotics per above  3GI bleed Stable Continue to hold Xarelto,continuePPI, methotrexate on hold,GIconsulted  4 acute respiratory failure with hypoxia Stable secondary to sepsis and pneumonia Continue mechanical ventilation with weaning as tolerated-becomes agitated with weaning of sedation  5AKI  Resolving Due toATN and sepsis Nephrology input appreciated  6chronic diabetes mellitus type 2 Stable Continue sliding scale insulin with Accu-Cheks per routine  7chronic diabetes mellitus type 2 Stable Continue sliding scale insulin with Accu-Cheks per routine  All the records are reviewed and case discussed with Care Management/Social Workerr. Management plans discussed with the patient, family and they are in agreement.  CODE STATUS: full  TOTAL TIME TAKING CARE OF THIS PATIENT: 35 minutes.   POSSIBLE D/C IN 3-5 DAYS, DEPENDING ON CLINICAL CONDITION.  Evelena AsaMontell D Roseanna Koplin M.D on 01/14/2018   Between 7am to 6pm - Pager - [phone]  After 6pm go to [email or web address] - password [email or web address]  [facility]  Office   [phone]  CC: Primary care physician; Patient, [name]  Note: This dictation was prepared with Dragon dictation along with smaller phrase technology. Any transcriptional errors that result from this process are unintentional.

## 2018-01-14 NOTE — Progress Notes (Signed)
Paged Dr Lady GaryFath regarding patients heart rate. Went up into 200's.

## 2018-01-14 NOTE — Progress Notes (Signed)
Dr Lady GaryFath notified of patients fluctuating heart rate 140-150's intermit. At one point 221 but back down to 90's while this RN at lunch. Heart rate does not sustain elevated. Per MD at this point no additional orders. Continue amiodarone at 60 mg hr.

## 2018-01-15 LAB — CBC WITH DIFFERENTIAL/PLATELET
Band Neutrophils: 3 %
Basophils Absolute: 0 10*3/uL (ref 0–0.1)
Basophils Relative: 0 %
Blasts: 0 %
EOS PCT: 8 %
Eosinophils Absolute: 0.5 10*3/uL (ref 0–0.7)
HEMATOCRIT: 30.1 % — AB (ref 40.0–52.0)
Hemoglobin: 9.9 g/dL — ABNORMAL LOW (ref 13.0–18.0)
LYMPHS ABS: 1.8 10*3/uL (ref 1.0–3.6)
Lymphocytes Relative: 31 %
MCH: 27.5 pg (ref 26.0–34.0)
MCHC: 32.9 g/dL (ref 32.0–36.0)
MCV: 83.9 fL (ref 80.0–100.0)
MYELOCYTES: 0 %
Metamyelocytes Relative: 0 %
Monocytes Absolute: 0.1 10*3/uL — ABNORMAL LOW (ref 0.2–1.0)
Monocytes Relative: 2 %
NEUTROS PCT: 56 %
NRBC: 7 /100{WBCs} — AB
Neutro Abs: 3.5 10*3/uL (ref 1.4–6.5)
Other: 0 %
PROMYELOCYTES RELATIVE: 0 %
Platelets: 151 10*3/uL (ref 150–440)
RBC: 3.59 MIL/uL — AB (ref 4.40–5.90)
RDW: 18.5 % — ABNORMAL HIGH (ref 11.5–14.5)
WBC: 5.9 10*3/uL (ref 3.8–10.6)

## 2018-01-15 LAB — GLUCOSE, CAPILLARY
GLUCOSE-CAPILLARY: 210 mg/dL — AB (ref 65–99)
GLUCOSE-CAPILLARY: 248 mg/dL — AB (ref 65–99)
GLUCOSE-CAPILLARY: 208 mg/dL — AB (ref 65–99)
Glucose-Capillary: 183 mg/dL — ABNORMAL HIGH (ref 65–99)
Glucose-Capillary: 190 mg/dL — ABNORMAL HIGH (ref 65–99)
Glucose-Capillary: 195 mg/dL — ABNORMAL HIGH (ref 65–99)
Glucose-Capillary: 233 mg/dL — ABNORMAL HIGH (ref 65–99)

## 2018-01-15 LAB — BASIC METABOLIC PANEL
ANION GAP: 2 — AB (ref 5–15)
BUN: 47 mg/dL — ABNORMAL HIGH (ref 6–20)
CHLORIDE: 113 mmol/L — AB (ref 101–111)
CO2: 32 mmol/L (ref 22–32)
Calcium: 7.6 mg/dL — ABNORMAL LOW (ref 8.9–10.3)
Creatinine, Ser: 1.05 mg/dL (ref 0.61–1.24)
GFR calc Af Amer: >60 mL/min (ref >60)
GFR calc non Af Amer: >60 mL/min (ref >60)
GLUCOSE: 237 mg/dL — AB (ref 65–99)
POTASSIUM: 3.4 mmol/L — AB (ref 3.5–5.1)
Sodium: 147 mmol/L — ABNORMAL HIGH (ref 135–145)

## 2018-01-15 LAB — PATHOLOGIST SMEAR REVIEW

## 2018-01-15 LAB — MAGNESIUM: Magnesium: 1.6 mg/dL — ABNORMAL LOW (ref 1.7–2.4)

## 2018-01-15 LAB — PHOSPHORUS: Phosphorus: 1.7 mg/dL — ABNORMAL LOW (ref 2.5–4.6)

## 2018-01-15 MED ORDER — MAGNESIUM SULFATE 4 GM/100ML IV SOLN
4.0000 g | Freq: Once | INTRAVENOUS | Status: AC
  Administered 2018-01-15: 4 g via INTRAVENOUS
  Filled 2018-01-15: qty 100

## 2018-01-15 MED ORDER — STERILE WATER FOR INJECTION IJ SOLN
INTRAMUSCULAR | Status: AC
  Administered 2018-01-15: 10:00:00
  Filled 2018-01-15: qty 10

## 2018-01-15 MED ORDER — POTASSIUM & SODIUM PHOSPHATES 280-160-250 MG PO PACK
2.0000 | PACK | Freq: Four times a day (QID) | ORAL | Status: DC
  Administered 2018-01-15 – 2018-01-16 (×2): 2
  Filled 2018-01-15 (×3): qty 2

## 2018-01-15 MED ORDER — SODIUM CHLORIDE 0.9 % IV SOLN: 10.0000 ug/h | INTRAVENOUS | Status: DC

## 2018-01-15 MED ORDER — STERILE WATER FOR INJECTION IJ SOLN
INTRAMUSCULAR | Status: AC
  Administered 2018-01-15: 10 mL
  Filled 2018-01-15: qty 10

## 2018-01-15 MED ORDER — METOPROLOL TARTRATE 5 MG/5ML IV SOLN: 2.5000 mg | Freq: Once | INTRAVENOUS | Status: DC

## 2018-01-15 MED ORDER — SENNOSIDES-DOCUSATE SODIUM 8.6-50 MG PO TABS
2.0000 | ORAL_TABLET | Freq: Two times a day (BID) | ORAL | Status: DC
  Administered 2018-01-15 – 2018-01-28 (×26): 2
  Filled 2018-01-15 (×27): qty 2

## 2018-01-15 MED ORDER — POTASSIUM & SODIUM PHOSPHATES 280-160-250 MG PO PACK
1.0000 | PACK | ORAL | Status: AC
  Administered 2018-01-15: 1 via ORAL
  Filled 2018-01-15: qty 1

## 2018-01-15 MED ORDER — DIGOXIN 0.25 MG/ML IJ SOLN
0.2500 mg | Freq: Every day | INTRAMUSCULAR | Status: DC
Start: 2018-01-15 — End: 2018-01-18
  Administered 2018-01-15 – 2018-01-17 (×3): 0.25 mg via INTRAVENOUS
  Filled 2018-01-15 (×4): qty 1

## 2018-01-15 MED ORDER — INSULIN ASPART 100 UNIT/ML ~~LOC~~ SOLN
3.0000 [IU] | SUBCUTANEOUS | Status: DC
  Administered 2018-01-15 – 2018-01-17 (×10): 3 [IU] via SUBCUTANEOUS
  Filled 2018-01-15 (×9): qty 1

## 2018-01-15 MED ORDER — ALPRAZOLAM 0.5 MG PO TABS
0.5000 mg | ORAL_TABLET | Freq: Three times a day (TID) | ORAL | Status: DC
  Administered 2018-01-15 – 2018-01-16 (×4): 0.5 mg
  Filled 2018-01-15 (×4): qty 1

## 2018-01-15 MED ORDER — NOREPINEPHRINE 4 MG/250ML-% IV SOLN: 0.0000 ug/min | INTRAVENOUS | Status: DC

## 2018-01-15 MED ORDER — METOPROLOL TARTRATE 25 MG PO TABS
12.5000 mg | ORAL_TABLET | Freq: Three times a day (TID) | ORAL | Status: DC
Start: 2018-01-15 — End: 2018-01-20
  Administered 2018-01-15 – 2018-01-18 (×11): 12.5 mg via NASOGASTRIC
  Filled 2018-01-15 (×12): qty 1

## 2018-01-15 MED ORDER — AMIODARONE IV BOLUS ONLY 150 MG/100ML
150.0000 mg | Freq: Once | INTRAVENOUS | Status: AC
  Administered 2018-01-15: 150 mg via INTRAVENOUS

## 2018-01-15 NOTE — Progress Notes (Signed)
Nutrition Follow-up  DOCUMENTATION CODES:   Obesity unspecified  INTERVENTION:  Continue Vital High Protein at 45 mL/hr (1080 mL goal daily volume) + Pro-Stat 60 mL BID via OGT. Provides 1480 kcal, 155 grams of protein, 907 mL H2O daily.  Continue liquid MVI daily per tube.  NUTRITION DIAGNOSIS:   Inadequate oral intake related to inability to eat as evidenced by NPO status.  Ongoing - addressing with TF regimen.  GOAL:   Provide needs based on ASPEN/SCCM guidelines  Met with TF regimen.  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   -On 4/15 patient was advanced to goal tube feed rate.  Patient intubated and sedated. Abdomen soft. Tolerating tube feeds. Patient is very edematous.  Access: 18 Fr. OGT placed 4/8; terminates in distal stomach per CT abd/pelvis on 4/8; 60 cm at corner of mouth  MAP: 67-119 mmHg; pressors have been off since evening of 4/14  TF: pt tolerating Vital High Protein at 45 mL/hr + Pro-Stat 60 mL BID  Patient is currently intubated on ventilator support Ve: 12.1 L/min Temp (24hrs), Avg:98.3 F (36.8 C), Min:97.5 F (36.4 C), Max:98.8 F (37.1 C)  Propofol: N/A  Medications reviewed and include: Folic acid 0.5 mg daily per tube, Novolog 0-15 units Q4hrs, Lantus 15 units QHS, liquid MVI daily per tube, Protonix, senna-docusate, amiodarone gtt, Precedex gtt, fentanyl gtt, magnesium sulfate 4 grams IV once today.  Labs reviewed: CBG 181-340 past 24 hrs, Sodium 147, Potassium 3.4,  Chloride 113, BUN 47 (trending down), Anion gap 2, Phosphorus 1.7, Magnesium 1.6.  I/O: 4295 mL UOP yesterday (1.4 mL/kg/hr)  Weight trend: 130.1 kg on 4/15; +22.7 kg from admission  Discussed with RN and on rounds. Patient may need tracheostomy. Per chart family would like to pursue trach if needed. Plan is to increase bowel regimen.  Diet Order:  No diet orders on file  EDUCATION NEEDS:   Not appropriate for education at this time  Skin:  Skin Assessment: Skin Integrity Issues:(serous blister right thigh; MSAD inner gluteal folds)  Last BM:  01/13/2018 - no BM characteristics documented  Height:   Ht Readings from Last 1 Encounters:  01/07/18 '5\' 10"'  (1.778 m)    Weight:   Wt Readings from Last 1 Encounters:  01/13/18 286 lb 13.1 oz (130.1 kg)    Ideal Body Weight:  75.5 kg  BMI:  Body mass index is 41.15 kg/m.  Estimated Nutritional Needs:   Kcal:  8333-8329 (11-14 kcal/kg)  Protein:  >/= 151 grams (>/= 2 grams/kg IBW)  Fluid:  1.8 L/day  Willey Blade, MS, RD, LDN Office: [phone] Pager: [phone] After Hours/Weekend Pager: [phone]

## 2018-01-15 NOTE — Progress Notes (Signed)
Pharmacy ICU Monitoring Consult:  Pharmacy consulted to assist in medication monitoring and adjustment in this 57 y.o. male admitted on 01/06/2018 with Respiratory Distress  Patient currently requiring mechanical ventilation.  Labs:  Sodium (mmol/L)  Date Value  01/15/2018 147 (H)   Potassium (mmol/L)  Date Value  01/15/2018 3.4 (L)   Magnesium (mg/dL)  Date Value  16/10/960404/17/2019 1.6 (L)   Phosphorus (mg/dL)  Date Value  54/09/811904/17/2019 1.7 (L)   Calcium (mg/dL)  Date Value  14/78/295604/17/2019 7.6 (L)   Albumin (g/dL)  Date Value  21/30/865704/15/2019 2.2 (L)     Plan: Will order  potassium phosphate packets x 3 and magnesium 4g IV x 1. Will recheck electrolytes with am labs.   Per diabetes team recommendations, will initiate Novolog 3 units Q4hr to cover carbohydrates in tube feeds. Will continue Lantus 15 units Daily an Q4hr SSI.   Last bowel movement was 4/15. Will initiate senna/docusate 2 tabs BID for constipation.   No medication adjustments secondary to amiodarone infusion warranted at this time.   Pharmacy will continue to monitor and adjust per consult.   Tanmay Halteman L 01/15/2018 10:57 PM

## 2018-01-15 NOTE — Progress Notes (Signed)
Pharmacy Electrolyte Monitoring Consult:  Pharmacy consulted to assist in monitoring and replacing electrolytes in this 57 y.o. male admitted on 01/06/2018 with Respiratory Distress  Patient currently requiring mechanical ventilation and multiple pressors.   Labs:  Sodium (mmol/Mathews)  Date Value  01/15/2018 147 (H)   Potassium (mmol/Mathews)  Date Value  01/15/2018 3.4 (Mathews)   Magnesium (mg/dL)  Date Value  16/10/960404/17/2019 1.6 (Mathews)   Phosphorus (mg/dL)  Date Value  54/09/811904/17/2019 1.7 (Mathews)   Calcium (mg/dL)  Date Value  14/78/295604/17/2019 7.6 (Mathews)   Albumin (g/dL)  Date Value  21/30/865704/15/2019 2.2 (Mathews)     Plan: Will order  potassium phosphate packets x 3 and magnesium 4g IV x 1. Will recheck electrolytes with am labs.   Per diabetes team recommendations, will initiate Novolog 3 units Q4hr to cover carbohydrates in tube feeds. Will continue Lantus 15 units Daily an Q4hr SSI.   Last bowel movement was 4/15. Will initiate senna/docusate 2 tabs BID for constipation.   Pharmacy will continue to monitor and adjust per consult.   Tommy Mathews,Tommy Mathews 01/15/2018 10:47 PM

## 2018-01-15 NOTE — Progress Notes (Signed)
[employer] - [place] [facility]   PATIENT NAME: Tommy Mathews    MR#:  [id number]  DATE OF BIRTH:  01/31/1961  SUBJECTIVE:  CHIEF COMPLAINT:   Chief Complaint  Patient presents with  . Respiratory Distress  possible need for trach, remains crititcally ill  REVIEW OF SYSTEMS:  CONSTITUTIONAL: No fever, fatigue or weakness.  EYES: No blurred or double vision.  EARS, NOSE, AND THROAT: No tinnitus or ear pain.  RESPIRATORY: No cough, shortness of breath, wheezing or hemoptysis.  CARDIOVASCULAR: No chest pain, orthopnea, edema.  GASTROINTESTINAL: No nausea, vomiting, diarrhea or abdominal pain.  GENITOURINARY: No dysuria, hematuria.  ENDOCRINE: No polyuria, nocturia,  HEMATOLOGY: No anemia, easy bruising or bleeding SKIN: No rash or lesion. MUSCULOSKELETAL: No joint pain or arthritis.   NEUROLOGIC: No tingling, numbness, weakness.  PSYCHIATRY: No anxiety or depression.   ROS  DRUG ALLERGIES:  Not on File  VITALS:  Blood pressure (!) 144/83, pulse 97, temperature 99.1 F (37.3 C), resp. rate (!) 30, height 5\' 10"  (1.778 m), weight 130.1 kg (286 lb 13.1 oz), SpO2 96 %.  PHYSICAL EXAMINATION:  GENERAL:  57 y.o.-year-old patient lying in the bed with no acute distress.  EYES: Pupils equal, round, reactive to light and accommodation. No scleral icterus. Extraocular muscles intact.  HEENT: Head atraumatic, normocephalic. Oropharynx and nasopharynx clear.  NECK:  Supple, no jugular venous distention. No thyroid enlargement, no tenderness.  LUNGS: Normal breath sounds bilaterally, no wheezing, rales,rhonchi or crepitation. No use of accessory muscles of respiration.  CARDIOVASCULAR: S1, S2 normal. No murmurs, rubs, or gallops.  ABDOMEN: Soft, nontender, nondistended. Bowel sounds present. No organomegaly or mass.  EXTREMITIES: No pedal edema, cyanosis, or clubbing.  NEUROLOGIC: Cranial nerves II through XII are intact. Muscle strength 5/5 in all extremities.  Sensation intact. Gait not checked.  PSYCHIATRIC: The patient is alert and oriented x 3.  SKIN: No obvious rash, lesion, or ulcer.   Physical Exam LABORATORY PANEL:   CBC Recent Labs  Lab 01/15/18 0412  WBC 5.9  HGB 9.9*  HCT 30.1*  PLT 151   ------------------------------------------------------------------------------------------------------------------  Chemistries  Recent Labs  Lab 01/10/18 0747  01/15/18 0412  NA 139   < > 147*  K 3.7   < > 3.4*  CL 107   < > 113*  CO2 24   < > 32  GLUCOSE 206*   < > 237*  BUN 53*   < > 47*  CREATININE 1.77*   < > 1.05  CALCIUM 5.6*   < > 7.6*  MG 1.5*   < > 1.6*  AST 45*  --   --   ALT 28  --   --   ALKPHOS 52  --   --   BILITOT 0.6  --   --    < > = values in this interval not displayed.   ------------------------------------------------------------------------------------------------------------------  Cardiac Enzymes No results for input(s): TROPONINI in the last 168 hours. ------------------------------------------------------------------------------------------------------------------  RADIOLOGY:  Dg Abd 1 View  Result Date: 01/13/2018 CLINICAL DATA:  Orogastric tube placement EXAM: ABDOMEN - 1 VIEW COMPARISON:  CT 01/06/2018 FINDINGS: Consolidation at the left lung base. Esophageal tube tip projects over the proximal stomach. Air-filled dilated bowel in the upper abdomen. IMPRESSION: Esophageal tube tip overlies the proximal stomach. There is consolidation at the left lung base. Electronically Signed   By: Jasmine PangKim  Fujinaga M.D.   On: 01/13/2018 21:32    ASSESSMENT AND PLAN:  1 acute severe septic shock  Resolved Due to his pneumonia and UTI Continue empiric cefepime/vancomycin/Flagyl, vasopressor supportweaned off,IV fluids for rehydration  Pro-calcitoninwas27.48 Echocardiogram noted for ejection fraction 20-25% -prognosis poor  2 acutepneumonia/UTI Continue pneumonia protocol, antibiotics per above  3GI  bleed Stable Continue to hold Xarelto,continuePPI, methotrexate on hold,GIconsulted  4 acute respiratory failure with hypoxia Stable secondary to sepsis and pneumonia Continue mechanical ventilation with weaning as tolerated-becomes agitated with weaning of sedation, possible need for trach  5AKI  Resolving Due toATN and sepsis Nephrology input appreciated  6chronic diabetes mellitus type 2 Stable Continue sliding scale insulin with Accu-Cheks per routine  7chronic diabetes mellitus type 2 Stable Continue sliding scale insulin with Accu-Cheks per routine    All the records are reviewed and case discussed with Care Management/Social Workerr. Management plans discussed with the patient, family and they are in agreement.  CODE STATUS: full  TOTAL TIME TAKING CARE OF THIS PATIENT: 35 minutes.     POSSIBLE D/C IN 3-5 DAYS, DEPENDING ON CLINICAL CONDITION.   [name] M.D on 01/15/2018   Between 7am to 6pm - Pager - [phone]  After 6pm go to [email or web address] - password [email or web address]  [facility] [place]  Office  [phone]  CC: Primary care physician; Patient, [name]  Note: This dictation was prepared with Dragon dictation along with smaller phrase technology. Any transcriptional errors that result from this process are unintentional.

## 2018-01-15 NOTE — Progress Notes (Signed)
PULMONARY / CRITICAL CARE MEDICINE   Name: Tommy Mathews MRN: [id number] DOB: 06/19/1961    ADMISSION DATE:  01/06/2018   PT PROFILE:  57 M SNF resident due to psychiatric illness and history of TBI admitted with severe bilateral pneumonia, severe sepsis/septic shock.  Intubated in the ED shortly after arrival.  Was also noted to have dark coffee-ground material suctioned from stomach.  Patient chronically on rivaroxaban.   MAJOR EVENTS/TEST RESULTS: 04/08 Admission as above 04/08 CT chest: Multifocal severe pneumonia with RUL collapse and soft tissue obstructing RUL bronchus. 04/08 CTAP: Mild small bowel ileus, potential enteritis. Nasogastric tube terminates in distal stomach. Cholelithiasis without CT findings of acute cholecystitis.Consider bronchoscopy.  04/09 nephrology consultation: No acute indication for dialysis at present. 04/09 gastroenterology consultation: Recommend high-dose PPI therapy. 04/10 severely hypoxemic despite 100% FiO2.  Recruitment maneuver with significant improvement.  PEEP increased and ARDS strategy on ventilator implemented.  Neuromuscular blockade implemented due to ventilator dyssynchrony. 04/11 PAF with RVR > converted with amiodarone. Gas exchange improving. Off Nimbex 04/13 High airway pressure with vent dyssynchony; given Vecuronium; FIO2 increased to 50%  4/13 Amiodarone D/C because of SB 4/15 increased HR, EF 20% afib with RVR 4/16 remains intubated  INDWELLING DEVICES:: ETT 04/08 >>  L femoral CVL 04/08 >> 4/16 PICC RT ARM 4/15  MICRO DATA: MRSA PCR 04/08 >> POS Urine 04/08 >> 10k coag negative staph Resp 04/09 >> c/w NOF Blood 04/08 >>  Blood 04/09 >>   ANTIMICROBIALS:  Unasyn 04/08 >> 04/10 Vanc 04/08 >> 4/15 Cefepime 04/10 >> 4/15 Metronidazole 04/10 >> 4/15   SUBJECTIVE:  Patient remains intubated,sedated Remains on high vent support fio2 at 40% PEEP 10 Remains critically ill Family at bedside Patient will likely need   Trach Plan for SAT/SBT's    VITAL SIGNS: BP 109/74   Pulse 87   Temp 98.8 F (37.1 C)   Resp (!) 30   Ht 5\' 10"  (1.778 m)   Wt 286 lb 13.1 oz (130.1 kg)   SpO2 96%   BMI 41.15 kg/m    VENTILATOR SETTINGS: Vent Mode: PRVC FiO2 (%):  [40 %] 40 % Set Rate:  [30 bmp] 30 bmp Vt Set:  [400 mL] 400 mL PEEP:  [10 cmH20] 10 cmH20 Plateau Pressure:  [25 cmH20-30 cmH20] 25 cmH20  INTAKE / OUTPUT: I/O last 3 completed shifts: In: 4009.1 [I.V.:2214.3; NG/GT:1794.8] Out: 5495 [Urine:5495]  PHYSICAL EXAMINATION: General: RASS -4, -5. Sedated, synchronous with ventilator Neuro: PERRLA, EOMI, DTRs symmetric, no spontaneous movement HEENT: PERRL, sclerae white Cardiovascular: RRR, no M Lungs: bilateral rhonchi anteriorly, no wheezes Abdomen: Mildly distended, bowel sounds present Extremities: Warm, 2+ edema upper > lower Skin: No lesions noted  LABS:  BMET Recent Labs  Lab 01/13/18 0337 01/14/18 0355 01/15/18 0412  NA 141 143 147*  K 3.4* 3.4* 3.4*  CL 108 111 113*  CO2 27 29 32  BUN 63* 55* 47*  CREATININE 1.31* 1.10 1.05  GLUCOSE 189* 155* 237*    Electrolytes Recent Labs  Lab 01/12/18 0438 01/13/18 0337 01/14/18 0355 01/15/18 0412  CALCIUM 7.1* 7.1* 7.7* 7.6*  MG 1.8 1.9  --  1.6*  PHOS 2.9 2.2*  --  1.7*    CBC Recent Labs  Lab 01/13/18 0337 01/14/18 0355 01/15/18 0412  WBC 3.9 5.6 5.9  HGB 9.8* 10.1* 9.9*  HCT 29.9* 30.7* 30.1*  PLT 91* 130* 151    Coag's No results for input(s): APTT, INR in the last 168 hours.  Sepsis  Markers Recent Labs  Lab 01/09/18 0626 01/10/18 0747  PROCALCITON 27.48 16.68    ABG Recent Labs  Lab 01/11/18 0500 01/12/18 0506 01/13/18 0419  PHART 7.33* 7.29* 7.30*  PCO2ART 41 49* 54*  PO2ART 77* 83 89    Liver Enzymes Recent Labs  Lab 01/10/18 0747 01/11/18 0356 01/13/18 0337  AST 45*  --   --   ALT 28  --   --   ALKPHOS 52  --   --   BILITOT 0.6  --   --   ALBUMIN 1.9* 1.7* 2.2*    Cardiac  Enzymes No results for input(s): TROPONINI, PROBNP in the last 168 hours.  Glucose Recent Labs  Lab 01/14/18 1130 01/14/18 1551 01/14/18 1940 01/14/18 2323 01/15/18 0406 01/15/18 0752  GLUCAP 230* 240* 181* 212* 210* 233*    CXR: No new film  ASSESSMENT / PLAN: 57 yo AAM admitted for severe resp failure from severe b/l pneumonia with ARDS with septic shock and multiorgan failure with severe Systolic CHF   PULMONARY A: Acute hypoxemic respiratory failure HCAP, NOS -possible aspiration ARDS P:   Cont full vent support -continue lung protection strategy.   Cont vent bundle Daily SBT if/when meets criteria Will try SAT/SBT's next several days and plan for Trach if he fails Family would like to pursue trach if needed  CARDIOVASCULAR A:  Septic shock,  still on Vasopressin and Phenylephrine Paroxysmal atrial fibrillation with RVR-converted with amiodarone 04/11; Amiodarone D/C last evening because of sinus bradycardia P:  MAP goal > 65 mmHg Continue on phenylephrine, vasopressin Wean norepinephrine first due to AFRVR Continue empiric hydrocortisone Echocardiogram-very  poor LVF, EF 20-25%  RENAL A:   AKI, oliguric.  Creatinine slightly worse today but UO adequate P:   Monitor BMET intermittently Monitor I/Os Correct electrolytes as indicated  Lasix 80 x 1 today  GASTROINTESTINAL A:   Abdominal distention, improved  P:   SUP: IV PPI  TF protocol at goal  HEMATOLOGIC A:    Thrombocytopenia Leukopenia P:  DVT px: SCD Heparin-plt count improved Monitor CBC intermittently Transfuse per usual guidelines   INFECTIOUS A:   Severe sepsis with shock Community acquired pneumonia Apyrexial today Elevated PCT P:   Monitor temp, WBC count Micro and abx as above   ENDOCRINE A:   Type 2 diabetes Mild steroid and stress induced hyperglycemia P:   Initiate SSI, moderate scale  NEUROLOGIC A:   Recurrent Ventilator dyssynchrony,   ICU/ventilator  associated discomfort Agitation today even on Fentanyl and Propofol drips History of schizophrenia History of TBI P:   RASS goal: -2, -3 Continue fentanyl and propofol Wean sedation    Critical Care Time devoted to patient care services described in this note is 31 minutes.   Overall, patient is critically ill, prognosis is guarded.  Patient with Multiorgan failure and at high risk for cardiac arrest and death.    Lucie LeatherKurian David Adaeze Better, M.D.  [facility]  Medical Director [facility] Medical Director [facility] Cardio[facility]

## 2018-01-15 NOTE — Progress Notes (Signed)
Patient Name: Tommy Mathews Date of Encounter: 01/15/2018  Hospital Problem List     Principal Problem:   Severe sepsis with septic shock [facility]) Active Problems:   GI bleed   Acute respiratory failure with hypoxia (HCC)   Multifocal pneumonia   UTI (urinary tract infection)   AKI (acute kidney injury) ([place])   Ileus ([place])   Diabetes ([place])    Patient Profile     Pt admitted with hypoxia and sepsis requiring pressures transiantly and intubation.  Subjective   Intubated and sedated.  Inpatient Medications    . ALPRAZolam  0.5 mg Per Tube TID  . artificial tears  1 application Both Eyes S0F  . atorvastatin  80 mg Per Tube q1800  . budesonide (PULMICORT) nebulizer solution  0.5 mg Nebulization BID  . chlorhexidine gluconate (MEDLINE KIT)  15 mL Mouth Rinse BID  . digoxin  0.25 mg Intravenous Daily  . enoxaparin (LOVENOX) injection  40 mg Subcutaneous Daily  . feeding supplement (PRO-STAT SUGAR FREE 64)  60 mL Per Tube BID  . FLUoxetine  40 mg Per Tube Daily  . folic acid  0.5 mg Per Tube Daily  . insulin aspart  0-15 Units Subcutaneous Q4H  . insulin glargine  15 Units Subcutaneous QHS  . ipratropium-albuterol  3 mL Nebulization Q4H  . mouth rinse  15 mL Mouth Rinse 10 times per day  . metoprolol tartrate  2.5 mg Intravenous Once  . metoprolol tartrate  12.5 mg Per NG tube TID  . multivitamin  15 mL Per Tube Daily  . pantoprazole sodium  40 mg Per Tube Daily  . senna-docusate  2 tablet Per Tube BID  . Valproate Sodium  500 mg Per Tube BID    Vital Signs    Vitals:   01/15/18 1730 01/15/18 1800 01/15/18 1830 01/15/18 1900  BP:    117/73  Pulse: 74 (!) 103 96 64  Resp: (!) 30 (!) 25 (!) 21 (!) 30  Temp: 98.2 F (36.8 C) 98.1 F (36.7 C) 98.2 F (36.8 C) 98.1 F (36.7 C)  TempSrc:      SpO2: 95% 92% 94% 95%  Weight:      Height:        Intake/Output Summary (Last 24 hours) at 01/15/2018 2149 Last data filed at 01/15/2018 2000 Gross per 24 hour  Intake  3963.69 ml  Output 2395 ml  Net 1568.69 ml   Filed Weights   01/08/18 0333 01/09/18 0500 01/13/18 0100  Weight: 116.5 kg (256 lb 13.4 oz) 118.4 kg (261 lb 0.4 oz) 130.1 kg (286 lb 13.1 oz)    Physical Exam    GEN: Well nourished, well developed, in no acute distress.  HEENT: normal.  Neck: Supple, no JVD, carotid bruits, or masses. Cardiac: RRR, no murmurs, rubs, or gallops. No clubbing, cyanosis, edema.  Radials/DP/PT 2+ and equal bilaterally.  Respiratory:  Respirations regular and unlabored, clear to auscultation bilaterally. GI: Soft, nontender, nondistended, BS + x 4. MS: no deformity or atrophy. Skin: warm and dry, no rash. Neuro:  Strength and sensation are intact. Psych: Normal affect.  Labs    CBC Recent Labs    01/14/18 0355 01/15/18 0412  WBC 5.6 5.9  NEUTROABS 3.4 3.5  HGB 10.1* 9.9*  HCT 30.7* 30.1*  MCV 84.4 83.9  PLT 130* 093   Basic Metabolic Panel Recent Labs    01/13/18 0337 01/14/18 0355 01/15/18 0412  NA 141 143 147*  K 3.4* 3.4* 3.4*  CL  108 111 113*  CO2 27 29 32  GLUCOSE 189* 155* 237*  BUN 63* 55* 47*  CREATININE 1.31* 1.10 1.05  CALCIUM 7.1* 7.7* 7.6*  MG 1.9  --  1.6*  PHOS 2.2*  --  1.7*   Liver Function Tests Recent Labs    01/13/18 0337  ALBUMIN 2.2*   No results for input(s): LIPASE, AMYLASE in the last 72 hours. Cardiac Enzymes No results for input(s): CKTOTAL, CKMB, CKMBINDEX, TROPONINI in the last 72 hours. BNP No results for input(s): BNP in the last 72 hours. D-Dimer No results for input(s): DDIMER in the last 72 hours. Hemoglobin A1C No results for input(s): HGBA1C in the last 72 hours. Fasting Lipid Panel No results for input(s): CHOL, HDL, LDLCALC, TRIG, CHOLHDL, LDLDIRECT in the last 72 hours. Thyroid Function Tests No results for input(s): TSH, T4TOTAL, T3FREE, THYROIDAB in the last 72 hours.  Invalid input(s): FREET3  Telemetry    afib with rvr and nsr  ECG    afib with rvr  Radiology    Ct  Abdomen Pelvis Wo Contrast  Result Date: 01/06/2018 CLINICAL DATA:  Respiratory arrest.  Suspect pulmonary embolism. EXAM: CT CHEST, ABDOMEN AND PELVIS WITHOUT CONTRAST TECHNIQUE: Multidetector CT imaging of the chest, abdomen and pelvis was performed following the standard protocol without IV contrast. COMPARISON:  Chest radiograph January 06, 2018 FINDINGS: CT CHEST FINDINGS CARDIOVASCULAR: Heart and pericardium are unremarkable. Thoracic aorta is normal course and caliber, mild calcific atherosclerosis. MEDIASTINUM/NODES: No mediastinal mass. No lymphadenopathy by CT size criteria limited assessment without contrast. Normal appearance of thoracic esophagus though not tailored for evaluation. LUNGS/PLEURA: Tracheobronchial tree is patent, no pneumothorax. Endotracheal tube tip above the carina. Bilateral tree-in-bud infiltrates and dense consolidation with air bronchograms. RIGHT upper lobe collapse with soft tissue effacing RIGHT upper lobe bronchus. MUSCULOSKELETAL: Included soft tissues and included osseous structures appear normal. CT ABDOMEN PELVIS FINDINGS HEPATOBILIARY: Subcentimeter layering gallstones without CT findings of acute cholecystitis by noncontrast CT. Trace perihepatic focal fat. PANCREAS: Normal. SPLEEN: Normal. ADRENALS/URINARY TRACT: Kidneys are orthotopic, demonstrating normal size and morphology. No nephrolithiasis, hydronephrosis; limited assessment for renal masses on this nonenhanced examination. The unopacified ureters are normal in course and caliber. Urinary bladder decompressed by Foley catheter. Normal adrenal glands. STOMACH/BOWEL: Nasogastric tube terminates in distal stomach. Gas distended small bowel measuring to 3.8 cm with multiple transition points in air-fluid levels. Large bowel normal in course and caliber. Normal appendix. VASCULAR/LYMPHATIC: Mildly ectatic 2.4 cm infrarenal aorta. Mild calcific atherosclerosis. No lymphadenopathy by CT size criteria. REPRODUCTIVE: Normal.  OTHER: No intraperitoneal free fluid or free air. MUSCULOSKELETAL: Non-acute. Small fat containing LEFT inguinal hernia. Mild L5-S1 degenerative disc. IMPRESSION: CT CHEST: 1. Pulmonary embolism cannot be excluded by noncontrast CT. 2. Multifocal severe pneumonia with RIGHT upper lobe collapse and soft tissue obstructing RIGHT upper lobe bronchus. Consider bronchoscopy. 3. Endotracheal tube tip above the carina. CT ABDOMEN AND PELVIS: 1. Mild small bowel ileus, potential enteritis. Nasogastric tube terminates in distal stomach. 2. Cholelithiasis without CT findings of acute cholecystitis. Aortic Atherosclerosis (ICD10-I70.0). Electronically Signed   By: Elon Alas M.D.   On: 01/06/2018 21:44   Dg Abd 1 View  Result Date: 01/13/2018 CLINICAL DATA:  Orogastric tube placement EXAM: ABDOMEN - 1 VIEW COMPARISON:  CT 01/06/2018 FINDINGS: Consolidation at the left lung base. Esophageal tube tip projects over the proximal stomach. Air-filled dilated bowel in the upper abdomen. IMPRESSION: Esophageal tube tip overlies the proximal stomach. There is consolidation at the left lung base. Electronically  Signed   By: Donavan Foil M.D.   On: 01/13/2018 21:32   Ct Chest Wo Contrast  Result Date: 01/06/2018 CLINICAL DATA:  Respiratory arrest.  Suspect pulmonary embolism. EXAM: CT CHEST, ABDOMEN AND PELVIS WITHOUT CONTRAST TECHNIQUE: Multidetector CT imaging of the chest, abdomen and pelvis was performed following the standard protocol without IV contrast. COMPARISON:  Chest radiograph January 06, 2018 FINDINGS: CT CHEST FINDINGS CARDIOVASCULAR: Heart and pericardium are unremarkable. Thoracic aorta is normal course and caliber, mild calcific atherosclerosis. MEDIASTINUM/NODES: No mediastinal mass. No lymphadenopathy by CT size criteria limited assessment without contrast. Normal appearance of thoracic esophagus though not tailored for evaluation. LUNGS/PLEURA: Tracheobronchial tree is patent, no pneumothorax.  Endotracheal tube tip above the carina. Bilateral tree-in-bud infiltrates and dense consolidation with air bronchograms. RIGHT upper lobe collapse with soft tissue effacing RIGHT upper lobe bronchus. MUSCULOSKELETAL: Included soft tissues and included osseous structures appear normal. CT ABDOMEN PELVIS FINDINGS HEPATOBILIARY: Subcentimeter layering gallstones without CT findings of acute cholecystitis by noncontrast CT. Trace perihepatic focal fat. PANCREAS: Normal. SPLEEN: Normal. ADRENALS/URINARY TRACT: Kidneys are orthotopic, demonstrating normal size and morphology. No nephrolithiasis, hydronephrosis; limited assessment for renal masses on this nonenhanced examination. The unopacified ureters are normal in course and caliber. Urinary bladder decompressed by Foley catheter. Normal adrenal glands. STOMACH/BOWEL: Nasogastric tube terminates in distal stomach. Gas distended small bowel measuring to 3.8 cm with multiple transition points in air-fluid levels. Large bowel normal in course and caliber. Normal appendix. VASCULAR/LYMPHATIC: Mildly ectatic 2.4 cm infrarenal aorta. Mild calcific atherosclerosis. No lymphadenopathy by CT size criteria. REPRODUCTIVE: Normal. OTHER: No intraperitoneal free fluid or free air. MUSCULOSKELETAL: Non-acute. Small fat containing LEFT inguinal hernia. Mild L5-S1 degenerative disc. IMPRESSION: CT CHEST: 1. Pulmonary embolism cannot be excluded by noncontrast CT. 2. Multifocal severe pneumonia with RIGHT upper lobe collapse and soft tissue obstructing RIGHT upper lobe bronchus. Consider bronchoscopy. 3. Endotracheal tube tip above the carina. CT ABDOMEN AND PELVIS: 1. Mild small bowel ileus, potential enteritis. Nasogastric tube terminates in distal stomach. 2. Cholelithiasis without CT findings of acute cholecystitis. Aortic Atherosclerosis (ICD10-I70.0). Electronically Signed   By: Elon Alas M.D.   On: 01/06/2018 21:44   Dg Chest Port 1 View  Result Date:  01/13/2018 CLINICAL DATA:  Respiratory failure. EXAM: PORTABLE CHEST 1 VIEW COMPARISON:  One-view chest x-ray 01/12/2018. FINDINGS: Heart size is exaggerate by low lung volumes. Endotracheal tube is stable. Left greater than right interstitial and airspace disease is similar the prior exam. Overall lung volumes are slightly improved. IMPRESSION: 1. Stable appearance of left greater than right interstitial and airspace disease, likely a combination of edema and possible infection. 2. Support apparatus is stable. 3. Slight improvement in lung volumes. Electronically Signed   By: San Morelle M.D.   On: 01/13/2018 07:49   Dg Chest Port 1 View  Result Date: 01/12/2018 CLINICAL DATA:  Acute respiratory failure with hypoxia. Severe sepsis with septic shock. On ventilator. EXAM: PORTABLE CHEST 1 VIEW COMPARISON:  01/11/2018 FINDINGS: Endotracheal tube and nasogastric tube remain in appropriate position. Heart size remains within normal limits. Low lung volumes again seen with diffuse interstitial infiltrates. No evidence of focal consolidation or significant pleural effusion. IMPRESSION: Stable low lung volumes with diffuse interstitial infiltrates/edema. Electronically Signed   By: Earle Gell M.D.   On: 01/12/2018 07:37   Dg Chest Port 1 View  Result Date: 01/11/2018 CLINICAL DATA:  Respiratory failure EXAM: PORTABLE CHEST 1 VIEW COMPARISON:  January 10, 2018 FINDINGS: The ETT terminates 2.8 cm above the  carina. No pneumothorax. The cardiomediastinal silhouette is stable. Pulmonary edema has decreased but persists. No other changes. IMPRESSION: 1. Stable support apparatus. 2. Persistent but decreased pulmonary edema. 3. No other change. Electronically Signed   By: Dorise Bullion III M.D   On: 01/11/2018 07:01   Dg Chest Port 1 View  Result Date: 01/10/2018 CLINICAL DATA:  Respiratory failure EXAM: PORTABLE CHEST 1 VIEW COMPARISON:  01/08/2018 FINDINGS: Cardiac shadow is stable. Endotracheal tube and  nasogastric catheter are again noted in satisfactory position. Bilateral infiltrates are again identified and stable. No new focal abnormality is seen. No bony abnormality is noted. IMPRESSION: Stable bilateral infiltrates. Electronically Signed   By: Inez Catalina M.D.   On: 01/10/2018 07:24   Dg Chest Port 1 View  Result Date: 01/08/2018 CLINICAL DATA:  Respiratory failure EXAM: PORTABLE CHEST 1 VIEW COMPARISON:  Portable exam 1003 hours compared to 01/07/2018 FINDINGS: Tip of endotracheal tube projects 4.4 cm above carina. Nasogastric tube extends into abdomen. Enlargement of cardiac silhouette. Diffuse BILATERAL airspace infiltrates increased in upper lobes since previous exam. No gross pleural effusion or pneumothorax. IMPRESSION: Increased BILATERAL airspace infiltrates. Electronically Signed   By: Lavonia Dana M.D.   On: 01/08/2018 10:33   Dg Chest Port 1 View  Result Date: 01/07/2018 CLINICAL DATA:  Acute respiratory failure.  Hypoxia. EXAM: PORTABLE CHEST 1 VIEW COMPARISON:  CT 6 hours prior, radiographs 8 hours prior. FINDINGS: Endotracheal tube 3.9 cm from the carina. Enteric tube in place with tip below the diaphragm, not included in the field of view. Progressive bilateral lung opacities, greatest in the lung bases with air bronchograms. Heart appears normal in size. No large pleural effusion. No pneumothorax. IMPRESSION: Progressive bilateral lung opacities, greatest in the lung bases, may reflect pneumonia or aspiration. Pulmonary edema is felt less likely. Electronically Signed   By: Jeb Levering M.D.   On: 01/07/2018 04:01   Dg Chest Portable 1 View  Result Date: 01/06/2018 CLINICAL DATA:  ETT placement EXAM: PORTABLE CHEST 1 VIEW COMPARISON:  None. FINDINGS: Endotracheal tube tip is about 2.4 cm superior to the carina. Low lung volumes. No pleural effusion or pneumothorax. Heart size within normal limits. Streaky left greater than right hilar opacity. IMPRESSION: 1. Endotracheal tube  tip about 2.4 cm superior to the carina 2. Low lung volumes. Streaky left greater than right perihilar opacity of uncertain chronicity. Electronically Signed   By: Donavan Foil M.D.   On: 01/06/2018 19:35   [place] Ekg Site Rite  Result Date: 01/13/2018 If Site Rite image not attached, placement could not be confirmed due to current cardiac rhythm.   Assessment & Plan    Afib/flutter-pt with paroxysmal afib. Has been on iv amiodarone but continues to have paroxysms of afib/flutter with rvr. This ppears to be exacerbated during weaning form sedation. Will continue with amiodarone and attempt to maintain nsr with metoprolol via ng tube. Will use prn iv digoxin as needed.   Respiratory ffailure=intubated. Sedated. Weaning per icu team.   Signed, Javier Docker. Malikhi Ogan MD 01/15/2018, 9:49 PM  Pager: [phone]

## 2018-01-15 NOTE — Progress Notes (Signed)
Inpatient Diabetes Program Recommendations  AACE/ADA: New Consensus Statement on Inpatient Glycemic Control (2015)  Target Ranges:  Prepandial:   less than 140 mg/dL      Peak postprandial:   less than 180 mg/dL (1-2 hours)      Critically ill patients:  140 - 180 mg/dL   Lab Results  Component Value Date   GLUCAP 248 (H) 01/15/2018    Review of Glycemic ControlResults for [name], [name] (MRN [id number]) as of 01/15/2018 14:31  Ref. Range 01/14/2018 19:40 01/14/2018 23:23 01/15/2018 04:06 01/15/2018 07:52 01/15/2018 12:00  Glucose-Capillary Latest Ref Range: 65 - 99 mg/dL 409181 (H) 811212 (H) 914210 (H) 233 (H) 248 (H)   Diabetes history: Type 2 DM Outpatient Diabetes medications: Metformin 1000 mg bid Current orders for Inpatient glycemic control:  Novolog moderate q 4 hours, Lantus 15 units q HS  Inpatient Diabetes Program Recommendations:    Please consider adding Novolog tube feed coverage 3 units q 4 hours to cover carbohydrate in tube feeds.    Thanks,  Beryl MeagerJenny Vivan Agostino, RN, BC-ADM Inpatient Diabetes Coordinator Pager [phone] (8a-5p)

## 2018-01-16 ENCOUNTER — Inpatient Hospital Stay: Payer: Medicaid Other

## 2018-01-16 LAB — GLUCOSE, CAPILLARY
GLUCOSE-CAPILLARY: 176 mg/dL — AB (ref 65–99)
GLUCOSE-CAPILLARY: 97 mg/dL (ref 65–99)
Glucose-Capillary: 172 mg/dL — ABNORMAL HIGH (ref 65–99)
Glucose-Capillary: 162 mg/dL — ABNORMAL HIGH (ref 65–99)
Glucose-Capillary: 161 mg/dL — ABNORMAL HIGH (ref 65–99)
Glucose-Capillary: 152 mg/dL — ABNORMAL HIGH (ref 65–99)

## 2018-01-16 LAB — BASIC METABOLIC PANEL
ANION GAP: 2 — AB (ref 5–15)
BUN: 30 mg/dL — ABNORMAL HIGH (ref 6–20)
CALCIUM: 6.3 mg/dL — AB (ref 8.9–10.3)
CO2: 28 mmol/L (ref 22–32)
CREATININE: 0.69 mg/dL (ref 0.61–1.24)
Chloride: 119 mmol/L — ABNORMAL HIGH (ref 101–111)
Glucose, Bld: 191 mg/dL — ABNORMAL HIGH (ref 65–99)
Potassium: 2.8 mmol/L — ABNORMAL LOW (ref 3.5–5.1)
SODIUM: 149 mmol/L — AB (ref 135–145)

## 2018-01-16 LAB — BLOOD GAS, ARTERIAL
ACID-BASE EXCESS: 11.5 mmol/L — AB (ref 0.0–2.0)
Bicarbonate: 36.5 mmol/L — ABNORMAL HIGH (ref 20.0–28.0)
FIO2: 0.7
Mechanical Rate: 30
O2 Saturation: 94.6 %
PEEP: 12 cmH2O
PH ART: 7.48 — AB (ref 7.350–7.450)
Patient temperature: 37
Pressure control: 30 cmH2O
pCO2 arterial: 49 mmHg — ABNORMAL HIGH (ref 32.0–48.0)
pO2, Arterial: 68 mmHg — ABNORMAL LOW (ref 83.0–108.0)

## 2018-01-16 LAB — MAGNESIUM: MAGNESIUM: 1.5 mg/dL — AB (ref 1.7–2.4)

## 2018-01-16 LAB — PHOSPHORUS: PHOSPHORUS: 1.4 mg/dL — AB (ref 2.5–4.6)

## 2018-01-16 LAB — ALBUMIN: ALBUMIN: 1.4 g/dL — AB (ref 3.5–5.0)

## 2018-01-16 MED ORDER — AMIODARONE HCL IN DEXTROSE 360-4.14 MG/200ML-% IV SOLN
30.0000 mg/h | INTRAVENOUS | Status: DC
  Administered 2018-01-16 – 2018-01-17 (×2): 30 mg/h via INTRAVENOUS
  Filled 2018-01-16 (×2): qty 200

## 2018-01-16 MED ORDER — POTASSIUM CHLORIDE 20 MEQ PO PACK
40.0000 meq | PACK | Freq: Once | ORAL | Status: AC
  Administered 2018-01-16: 40 meq via ORAL
  Filled 2018-01-16: qty 2

## 2018-01-16 MED ORDER — STERILE WATER FOR INJECTION IJ SOLN
INTRAMUSCULAR | Status: AC
  Filled 2018-01-16: qty 10

## 2018-01-16 MED ORDER — FUROSEMIDE 10 MG/ML IJ SOLN
INTRAMUSCULAR | Status: AC
  Administered 2018-01-16: 20 mg via INTRAVENOUS
  Filled 2018-01-16: qty 2

## 2018-01-16 MED ORDER — LORAZEPAM 2 MG/ML IJ SOLN
INTRAMUSCULAR | Status: AC
  Filled 2018-01-16: qty 2

## 2018-01-16 MED ORDER — ALTEPLASE 2 MG IJ SOLR
2.0000 mg | Freq: Once | INTRAMUSCULAR | Status: AC
  Administered 2018-01-16: 2 mg
  Filled 2018-01-16: qty 2

## 2018-01-16 MED ORDER — LORAZEPAM 2 MG/ML IJ SOLN
4.0000 mg | Freq: Once | INTRAMUSCULAR | Status: AC
  Administered 2018-01-16: 4 mg via INTRAVENOUS

## 2018-01-16 MED ORDER — LORAZEPAM 2 MG/ML IJ SOLN
2.0000 mg | INTRAMUSCULAR | Status: DC | PRN
  Administered 2018-01-16 – 2018-01-17 (×2): 4 mg via INTRAVENOUS
  Administered 2018-01-17: 2 mg via INTRAVENOUS
  Administered 2018-01-17: 4 mg via INTRAVENOUS
  Administered 2018-01-17: 2 mg via INTRAVENOUS
  Administered 2018-01-17 – 2018-01-20 (×10): 4 mg via INTRAVENOUS
  Filled 2018-01-16 (×8): qty 2
  Filled 2018-01-16: qty 1
  Filled 2018-01-16: qty 2
  Filled 2018-01-16: qty 1
  Filled 2018-01-16 (×4): qty 2

## 2018-01-16 MED ORDER — POTASSIUM PHOSPHATES 15 MMOLE/5ML IV SOLN
30.0000 mmol | Freq: Once | INTRAVENOUS | Status: AC
  Administered 2018-01-16: 30 mmol via INTRAVENOUS
  Filled 2018-01-16: qty 10

## 2018-01-16 MED ORDER — FUROSEMIDE 10 MG/ML IJ SOLN
20.0000 mg | Freq: Once | INTRAMUSCULAR | Status: AC
  Administered 2018-01-16: 20 mg via INTRAVENOUS

## 2018-01-16 MED ORDER — MAGNESIUM SULFATE 4 GM/100ML IV SOLN
4.0000 g | Freq: Once | INTRAVENOUS | Status: AC
  Administered 2018-01-16: 4 g via INTRAVENOUS
  Filled 2018-01-16: qty 100

## 2018-01-16 NOTE — Progress Notes (Signed)
Spoke with Dr Gwen PoundsKowalski- notified of heart rate being in the upper 50's. Orders to continue to give digoxin.

## 2018-01-16 NOTE — Progress Notes (Signed)
Pharmacy ICU Monitoring Consult:  Pharmacy consulted to assist in medication monitoring and adjustment in this 57 y.o. male admitted on 01/06/2018 with respiratory distress. Patient with history significant for TBI and admitted from group home.   Patient currently requiring mechanical ventilation and is sedated on fentanyl infusion and dexmedetomidine.   Patient completed 8 days of broad spectrum antibiotic therapy.   Labs:  Sodium (mmol/L)  Date Value  01/16/2018 149 (H)   Potassium (mmol/L)  Date Value  01/16/2018 2.8 (L)   Magnesium (mg/dL)  Date Value  16/10/960404/18/2019 1.5 (L)   Phosphorus (mg/dL)  Date Value  54/09/811904/18/2019 1.4 (L)   Calcium (mg/dL)  Date Value  14/78/295604/18/2019 6.3 (LL)   Albumin (g/dL)  Date Value  21/30/865704/18/2019 1.4 (L)     Plan: 1. Electrolytes: Corrected calcium: 8.4. Will order potassium phosphate 30 mmol IV x 1, magnesium 4g IV X 1, and potassium chloride 40mEq PO X 1. Will recheck potassium and phosphorus at 2200.   2. Diabetes: Continue Novolog 3 units Q4hr to cover carbohydrates in tube feeds. Will continue Lantus 15 units Daily an Q4hr SSI.   3. Constipation: Last bowel movement was 4/15. Continuesenna/docusate 2 tabs BID for constipation. If no bowel movement by 4/19, will order bisacodyl suppository x 1.   4. Amiodarone medication monitoring: No medication adjustments secondary to amiodarone infusion warranted at this time.   Pharmacy will continue to monitor and adjust per consult.   Simpson,Michael L 01/16/2018 12:04 PM

## 2018-01-16 NOTE — Progress Notes (Signed)
[facility] [place] [facility]   PATIENT NAME: Tommy PangJames Macias    MR#:  [id number]  DATE OF BIRTH:  11-01-1960  SUBJECTIVE:  CHIEF COMPLAINT:   Chief Complaint  Patient presents with  . Respiratory Distress  Case discussed with intensivist-for tracheostomy given failure to wean from ventilator, remains critically ill  REVIEW OF SYSTEMS:  CONSTITUTIONAL: No fever, fatigue or weakness.  EYES: No blurred or double vision.  EARS, NOSE, AND THROAT: No tinnitus or ear pain.  RESPIRATORY: No cough, shortness of breath, wheezing or hemoptysis.  CARDIOVASCULAR: No chest pain, orthopnea, edema.  GASTROINTESTINAL: No nausea, vomiting, diarrhea or abdominal pain.  GENITOURINARY: No dysuria, hematuria.  ENDOCRINE: No polyuria, nocturia,  HEMATOLOGY: No anemia, easy bruising or bleeding SKIN: No rash or lesion. MUSCULOSKELETAL: No joint pain or arthritis.   NEUROLOGIC: No tingling, numbness, weakness.  PSYCHIATRY: No anxiety or depression.   ROS  DRUG ALLERGIES:  Not on File  VITALS:  Blood pressure 132/69, pulse (!) 54, temperature (!) 97 F (36.1 C), resp. rate (!) 27, height 5\' 10"  (1.778 m), weight 130.1 kg (286 lb 13.1 oz), SpO2 93 %.  PHYSICAL EXAMINATION:  GENERAL:  57 y.o.-year-old patient lying in the bed with no acute distress.  EYES: Pupils equal, round, reactive to light and accommodation. No scleral icterus. Extraocular muscles intact.  HEENT: Head atraumatic, normocephalic. Oropharynx and nasopharynx clear.  NECK:  Supple, no jugular venous distention. No thyroid enlargement, no tenderness.  LUNGS: Normal breath sounds bilaterally, no wheezing, rales,rhonchi or crepitation. No use of accessory muscles of respiration.  CARDIOVASCULAR: S1, S2 normal. No murmurs, rubs, or gallops.  ABDOMEN: Soft, nontender, nondistended. Bowel sounds present. No organomegaly or mass.  EXTREMITIES: No pedal edema, cyanosis, or clubbing.  NEUROLOGIC: Cranial nerves II  through XII are intact. Muscle strength 5/5 in all extremities. Sensation intact. Gait not checked.  PSYCHIATRIC: The patient is alert and oriented x 3.  SKIN: No obvious rash, lesion, or ulcer.   Physical Exam LABORATORY PANEL:   CBC Recent Labs  Lab 01/15/18 0412  WBC 5.9  HGB 9.9*  HCT 30.1*  PLT 151   ------------------------------------------------------------------------------------------------------------------  Chemistries  Recent Labs  Lab 01/10/18 0747  01/16/18 1030  NA 139   < > 149*  K 3.7   < > 2.8*  CL 107   < > 119*  CO2 24   < > 28  GLUCOSE 206*   < > 191*  BUN 53*   < > 30*  CREATININE 1.77*   < > 0.69  CALCIUM 5.6*   < > 6.3*  MG 1.5*   < > 1.5*  AST 45*  --   --   ALT 28  --   --   ALKPHOS 52  --   --   BILITOT 0.6  --   --    < > = values in this interval not displayed.   ------------------------------------------------------------------------------------------------------------------  Cardiac Enzymes No results for input(s): TROPONINI in the last 168 hours. ------------------------------------------------------------------------------------------------------------------  RADIOLOGY:  No results found.  ASSESSMENT AND PLAN:  1 acute severe septic shock Resolved Due to his pneumonia and UTI Continue empiric cefepime/vancomycin/Flagyl, vasopressor supportweaned off,IV fluids for rehydration  Pro-calcitoninwas27.48 Echocardiogram noted for ejection fraction 20-25%-prognosis poor  2 acutepneumonia/UTI Continue pneumonia protocol, antibiotics per above  3GI bleed Stable Continue to hold Xarelto,continuePPI, methotrexate on hold,GIconsulted  4 acute respiratory failure with hypoxia Inability to wean from ventilator, in discussion with intensivist-we will proceed with tracheostomy secondary  to sepsis and pneumonia Becomes agitated with weaning of sedation  5AKI  Resolving Due toATN and sepsis Nephrology input  appreciated  6chronic diabetes mellitus type 2 Stable Continue sliding scale insulin with Accu-Cheks per routine  7chronic diabetes mellitus type 2 Stable Continue sliding scale insulin with Accu-Cheks per routine   All the records are reviewed and case discussed with Care Management/Social Workerr. Management plans discussed with the patient, family and they are in agreement.  CODE STATUS: full  TOTAL TIME TAKING CARE OF THIS PATIENT: 35 minutes.     POSSIBLE D/C IN 3-5 DAYS, DEPENDING ON CLINICAL CONDITION.   [name] M.D on 01/16/2018   Between 7am to 6pm - Pager - [phone]  After 6pm go to [email or web address] - password [email or web address]  [facility]  Office  [phone]  CC: Primary care physician; Patient, [name]  Note: This dictation was prepared with Dragon dictation along with smaller phrase technology. Any transcriptional errors that result from this process are unintentional.

## 2018-01-16 NOTE — Progress Notes (Signed)
Spoke with Dr Gwen PoundsKowalski. Patients heart rate 58. Orders to decrease amiodarone to 30 mg/hr. Orders to continue metoprolol as scheduled. Dr Belia HemanKasa notified of plan.

## 2018-01-16 NOTE — Progress Notes (Signed)
eLink Physician-Brief Progress Note Patient Name: Tommy Mathews DOB: 1961/08/08 MRN: [id number]   Date of Service  01/16/2018  HPI/Events of Note  Pt with hypoxemic resp failure secondary to ARDS with a possible component of hypervolemia. High airway pressures on PRVC. Also concern for possible dislodgement of NG/ ET tube  eICU Interventions  Stat portable CXR, switch to pressure control ventilation with PEEP of 12 and I:E of 1:1. Fio2 also increased. ABG in 1 hour. Plan is to wean fio2 and Pressure settings as lung compliance and oxygenation improve. Lasix 20 mg iv x 1.     Intervention Category Major Interventions: Respiratory failure - evaluation and management  Migdalia DkOkoronkwo U Ogan 01/16/2018, 8:52 PM

## 2018-01-16 NOTE — Plan of Care (Signed)
  Problem: Clinical Measurements: Goal: Ability to maintain clinical measurements within normal limits will improve Outcome: Progressing Goal: Will remain free from infection Outcome: Progressing Goal: Cardiovascular complication will be avoided Outcome: Progressing   Problem: Nutrition: Goal: Adequate nutrition will be maintained Outcome: Progressing Note:  TF running    Problem: Elimination: Goal: Will not experience complications related to urinary retention Outcome: Progressing   Problem: Safety: Goal: Ability to remain free from injury will improve Outcome: Progressing   Problem: Education: Goal: Knowledge of General Education information will improve Outcome: Not Met (add Reason) Note:  Pt is intubated and sedated   Problem: Clinical Measurements: Goal: Respiratory complications will improve Note:  Pt remains on ventilator

## 2018-01-16 NOTE — Progress Notes (Signed)
Placed patient back on sedation. Became agitated, biting ET tube and bucking the vent after sedation decreased to 50%. Patient was unable to follow any simple commands. Per Dr. Belia HemanKasa placed patient back on sedation.
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
## 2018-01-17 LAB — BLOOD GAS, ARTERIAL
Acid-Base Excess: 10.9 mmol/L — ABNORMAL HIGH (ref 0.0–2.0)
Bicarbonate: 37.4 mmol/L — ABNORMAL HIGH (ref 20.0–28.0)
FIO2: 0.7
Mechanical Rate: 30
O2 Saturation: 99.3 %
PEEP: 12 cmH2O
Patient temperature: 37
Pressure control: 26 cmH2O
pCO2 arterial: 59 mmHg — ABNORMAL HIGH (ref 32.0–48.0)
pH, Arterial: 7.41 (ref 7.350–7.450)
pO2, Arterial: 150 mmHg — ABNORMAL HIGH (ref 83.0–108.0)

## 2018-01-17 LAB — GLUCOSE, CAPILLARY
GLUCOSE-CAPILLARY: 123 mg/dL — AB (ref 65–99)
GLUCOSE-CAPILLARY: 136 mg/dL — AB (ref 65–99)
GLUCOSE-CAPILLARY: 148 mg/dL — AB (ref 65–99)
GLUCOSE-CAPILLARY: 66 mg/dL (ref 65–99)
GLUCOSE-CAPILLARY: 83 mg/dL (ref 65–99)
Glucose-Capillary: 139 mg/dL — ABNORMAL HIGH (ref 65–99)
Glucose-Capillary: 153 mg/dL — ABNORMAL HIGH (ref 65–99)
Glucose-Capillary: 65 mg/dL (ref 65–99)
Glucose-Capillary: 67 mg/dL (ref 65–99)
Glucose-Capillary: 92 mg/dL (ref 65–99)

## 2018-01-17 LAB — CBC
HEMATOCRIT: 27.9 % — AB (ref 40.0–52.0)
HEMOGLOBIN: 9.1 g/dL — AB (ref 13.0–18.0)
MCH: 27.6 pg (ref 26.0–34.0)
MCHC: 32.7 g/dL (ref 32.0–36.0)
MCV: 84.4 fL (ref 80.0–100.0)
Platelets: 172 10*3/uL (ref 150–440)
RBC: 3.31 MIL/uL — ABNORMAL LOW (ref 4.40–5.90)
RDW: 18.6 % — AB (ref 11.5–14.5)
WBC: 5.3 10*3/uL (ref 3.8–10.6)

## 2018-01-17 LAB — BASIC METABOLIC PANEL WITH GFR
Anion gap: 4 — ABNORMAL LOW (ref 5–15)
BUN: 32 mg/dL — ABNORMAL HIGH (ref 6–20)
CO2: 31 mmol/L (ref 22–32)
Calcium: 6.9 mg/dL — ABNORMAL LOW (ref 8.9–10.3)
Chloride: 115 mmol/L — ABNORMAL HIGH (ref 101–111)
Creatinine, Ser: 0.72 mg/dL (ref 0.61–1.24)
GFR calc Af Amer: >60 mL/min
GFR calc non Af Amer: >60 mL/min
Glucose, Bld: 160 mg/dL — ABNORMAL HIGH (ref 65–99)
Potassium: 3.6 mmol/L (ref 3.5–5.1)
Sodium: 150 mmol/L — ABNORMAL HIGH (ref 135–145)

## 2018-01-17 LAB — PHOSPHORUS
Phosphorus: 1.8 mg/dL — ABNORMAL LOW (ref 2.5–4.6)
Phosphorus: 2.8 mg/dL (ref 2.5–4.6)

## 2018-01-17 LAB — MAGNESIUM: Magnesium: 1.6 mg/dL — ABNORMAL LOW (ref 1.7–2.4)

## 2018-01-17 LAB — BRAIN NATRIURETIC PEPTIDE: B Natriuretic Peptide: 402 pg/mL — ABNORMAL HIGH (ref 0.0–100.0)

## 2018-01-17 LAB — POTASSIUM: Potassium: 3.4 mmol/L — ABNORMAL LOW (ref 3.5–5.1)

## 2018-01-17 MED ORDER — DEXTROSE 5 % IV SOLN
INTRAVENOUS | Status: DC
  Administered 2018-01-17: 11:00:00 via INTRAVENOUS
  Administered 2018-01-18: 50 mL/h via INTRAVENOUS

## 2018-01-17 MED ORDER — STERILE WATER FOR INJECTION IJ SOLN
INTRAMUSCULAR | Status: AC
  Administered 2018-01-17: 12:00:00
  Filled 2018-01-17: qty 10

## 2018-01-17 MED ORDER — AMIODARONE HCL 200 MG PO TABS
200.0000 mg | ORAL_TABLET | Freq: Two times a day (BID) | ORAL | Status: DC
  Administered 2018-01-17 – 2018-01-19 (×6): 200 mg via ORAL
  Filled 2018-01-17 (×6): qty 1

## 2018-01-17 MED ORDER — BISACODYL 10 MG RE SUPP
10.0000 mg | Freq: Once | RECTAL | Status: AC
  Administered 2018-01-17: 10 mg via RECTAL
  Filled 2018-01-17: qty 1

## 2018-01-17 MED ORDER — MAGNESIUM SULFATE 4 GM/100ML IV SOLN
4.0000 g | Freq: Once | INTRAVENOUS | Status: AC
  Administered 2018-01-17: 4 g via INTRAVENOUS
  Filled 2018-01-17: qty 100

## 2018-01-17 MED ORDER — DEXTROSE 50 % IV SOLN
INTRAVENOUS | Status: AC
  Administered 2018-01-17: 50 mL
  Filled 2018-01-17: qty 50
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
## 2018-01-17 NOTE — Progress Notes (Signed)
Dr Lonn Georgiaonforti at bedside talking to pt's 3 family mebers, MD has been updated on rounds on sedation level and pt's status.

## 2018-01-17 NOTE — Clinical Social Work Note (Signed)
CSW continuing to follow patient's progress throughout discharge planning.  Ervin KnackEric R. Phyllip Claw, MSW, Theresia MajorsLCSWA [phone]  01/17/2018 6:21 PM

## 2018-01-17 NOTE — Progress Notes (Signed)
Pharmacy ICU Monitoring Consult:  Pharmacy consulted to assist in medication monitoring and adjustment in this 57 y.o. male admitted on 01/06/2018 with respiratory distress. Patient with history significant for TBI and admitted from group home.   Patient currently requiring mechanical ventilation and is sedated on fentanyl infusion and dexmedetomidine.   Patient completed 8 days of broad spectrum antibiotic therapy.   Labs:  Sodium (mmol/L)  Date Value  01/17/2018 150 (H)   Potassium (mmol/L)  Date Value  01/17/2018 3.6   Magnesium (mg/dL)  Date Value  47/82/956204/19/2019 1.6 (L)   Phosphorus (mg/dL)  Date Value  13/08/657804/19/2019 2.8   Calcium (mg/dL)  Date Value  46/96/295204/19/2019 6.9 (L)   Albumin (g/dL)  Date Value  84/13/244004/18/2019 1.4 (L)     Plan: 1. Electrolytes: Corrected calcium in range. Will order magnesium 4g IV x 1. Will recheck electrolytes with am labs. Patient started on D5 @ 2250mL/hr for hypernatremia.   2. Diabetes: Discontinue Novolog 3 units Q4hr due to low blood glucose throughout the day on 4/18. Watch blood glucose while on D5. Will continue Lantus 15 units Daily an Q4hr SSI.   3. Constipation: Last bowel movement was 4/15. Continuesenna/docusate 2 tabs BID for constipation.Will order bisacodyl suppository x 1.   4. Amiodarone medication monitoring: No medication adjustments secondary to amiodarone infusion warranted at this time. Amiodarone transitioned to amiodarone 200mg  PO BID.   Pharmacy will continue to monitor and adjust per consult.   Simpson,Michael L 01/17/2018 8:37 PM
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
## 2018-01-17 NOTE — Progress Notes (Signed)
[facility] [place] [facility]   PATIENT NAME: Tommy PangJames Setterlund    MR#:  [id number]  DATE OF BIRTH:  Feb 10, 1961  SUBJECTIVE:   Patient remains intubated, sedated.  Plan for tracheostomy tomorrow.  No other acute events overnight.  REVIEW OF SYSTEMS:    Review of Systems  Unable to perform ROS: Intubated    Nutrition: Tube feeds Tolerating Diet: Yes Tolerating PT: sedated & intubated.      DRUG ALLERGIES:  Not on File  VITALS:  Blood pressure 98/65, pulse 76, temperature 100.2 F (37.9 C), resp. rate (!) 32, height 5\' 10"  (1.778 m), weight 130.1 kg (286 lb 13.1 oz), SpO2 98 %.  PHYSICAL EXAMINATION:   Physical Exam  GENERAL:  57 y.o.-year-old critically ill patient lying in bed intubated & sedated EYES: Pupils equal, round, reactive to light. No scleral icterus.  HEENT: Head atraumatic, normocephalic. ET and OG tubes in place.  NECK:  Supple, no jugular venous distention. No thyroid enlargement, no tenderness.  LUNGS: Poor Resp. effort, no wheezing, rales, rhonchi. No use of accessory muscles of respiration.  CARDIOVASCULAR: S1, S2 normal. No murmurs, rubs, or gallops.  ABDOMEN: Soft, nontender, nondistended. Bowel sounds present. No organomegaly or mass.  EXTREMITIES: No cyanosis, clubbing or edema b/l.    NEUROLOGIC: Sedated & intubated PSYCHIATRIC: Sedated & intubated.   SKIN: No obvious rash, lesion, or ulcer.    LABORATORY PANEL:   CBC Recent Labs  Lab 01/17/18 0425  WBC 5.3  HGB 9.1*  HCT 27.9*  PLT 172   ------------------------------------------------------------------------------------------------------------------  Chemistries  Recent Labs  Lab 01/17/18 0425  NA 150*  K 3.6  CL 115*  CO2 31  GLUCOSE 160*  BUN 32*  CREATININE 0.72  CALCIUM 6.9*  MG 1.6*   ------------------------------------------------------------------------------------------------------------------  Cardiac Enzymes No results for input(s):  TROPONINI in the last 168 hours. ------------------------------------------------------------------------------------------------------------------  RADIOLOGY:  Dg Chest Port 1 View  Result Date: 01/16/2018 CLINICAL DATA:  Hypoxia and acute respiratory failure. EXAM: PORTABLE CHEST 1 VIEW COMPARISON:  None. FINDINGS: Cardiomediastinal silhouette is unchanged. An endotracheal tube with tip 3.2 cm above the carina, NG tube entering the stomach, RIGHT PICC line with tip overlying the SUPERIOR cavoatrial junction are again noted. Interstitial/airspace opacities are again noted with slight improved aeration on the LEFT. Slightly increased RIGHT UPPER lobe atelectasis noted. There is no evidence of pneumothorax. IMPRESSION: Slightly increased RIGHT UPPER lobe atelectasis and slightly improved LEFT lung aeration. Otherwise no significant change. Electronically Signed   By: Harmon PierJeffrey  Hu M.D.   On: 01/16/2018 20:49     ASSESSMENT AND PLAN:   57 yo hx of HTN, Hyperlipidemia, GERD, Diabetes, who presented to the hospital for shortness of breath and noted to be in septic shock secondary to pneumonia.  1. acute severe septic shock -Patient is now off vasopressors.  Patient has finished treatment of underlying pneumonia UTI with IV antibiotics.  2. acutepneumonia/UTI -Patient has finished treatment with IV antibiotics for the pneumonia and UTI.  3.GI bleed Stable Continue to hold Xarelto,continuePPI, methotrexate on hold,  4. acute respiratory failure with hypoxia-this was secondary to pneumonia.  Patient was difficult to wean off the ventilator. -Seen by ENT and plan for tracheostomy tomorrow. -Continue supportive care as per pulmonary and aggressive pulmonary toileting for now.  Continue Pulmicort nebs, duo nebs.  5.  Atrial fibrillation with rapid ventricular response-patient has now converted to a sinus rhythm with the amiodarone drip. -Switched over to oral amiodarone today.  Continue  digoxin.  6.AKI-resolved and creatinine close to baseline.  7. chronic diabetes mellitus type 2 - cont. SSI and follow BS  8. Depression - cont. Prozac/Depakote.     All the records are reviewed and case discussed with Care Management/Social Worker. Management plans discussed with the patient, family and they are in agreement.  CODE STATUS: Full code  DVT Prophylaxis: Lovenox  TOTAL TIME TAKING CARE OF THIS PATIENT: 30 minutes.   POSSIBLE D/C unclear DEPENDING ON CLINICAL CONDITION.   Houston Siren M.D on 01/17/2018 at 3:58 PM  Between 7am to 6pm - Pager - [phone]  After 6pm go to [email or web address] - Social research officer, government  [facility]  Office  [phone]  CC: Primary care physician; Patient, [name]

## 2018-01-17 NOTE — Progress Notes (Signed)
Wake up assessment done, pt woke up and reached for ETT, unable to follow commands, very agitated. Sedation restarted, pt now calm.

## 2018-01-18 ENCOUNTER — Inpatient Hospital Stay: Payer: Medicaid Other

## 2018-01-18 DIAGNOSIS — L8990 Pressure ulcer of unspecified site, unspecified stage: Secondary | ICD-10-CM

## 2018-01-18 LAB — COMPREHENSIVE METABOLIC PANEL
ALBUMIN: 1.7 g/dL — AB (ref 3.5–5.0)
ALK PHOS: 74 U/L (ref 38–126)
ALT: 20 U/L (ref 17–63)
ANION GAP: 2 — AB (ref 5–15)
AST: 22 U/L (ref 15–41)
BILIRUBIN TOTAL: 0.3 mg/dL (ref 0.3–1.2)
BUN: 32 mg/dL — ABNORMAL HIGH (ref 6–20)
CALCIUM: 7.7 mg/dL — AB (ref 8.9–10.3)
CO2: 35 mmol/L — ABNORMAL HIGH (ref 22–32)
Chloride: 112 mmol/L — ABNORMAL HIGH (ref 101–111)
Creatinine, Ser: 0.85 mg/dL (ref 0.61–1.24)
GLUCOSE: 161 mg/dL — AB (ref 65–99)
POTASSIUM: 4.3 mmol/L (ref 3.5–5.1)
Sodium: 149 mmol/L — ABNORMAL HIGH (ref 135–145)
TOTAL PROTEIN: 5.1 g/dL — AB (ref 6.5–8.1)

## 2018-01-18 LAB — GLUCOSE, CAPILLARY
GLUCOSE-CAPILLARY: 147 mg/dL — AB (ref 65–99)
GLUCOSE-CAPILLARY: 142 mg/dL — AB (ref 65–99)
GLUCOSE-CAPILLARY: 196 mg/dL — AB (ref 65–99)
Glucose-Capillary: 163 mg/dL — ABNORMAL HIGH (ref 65–99)
Glucose-Capillary: 167 mg/dL — ABNORMAL HIGH (ref 65–99)
Glucose-Capillary: 204 mg/dL — ABNORMAL HIGH (ref 65–99)

## 2018-01-18 LAB — CBC
HEMATOCRIT: 28.2 % — AB (ref 40.0–52.0)
Hemoglobin: 9.2 g/dL — ABNORMAL LOW (ref 13.0–18.0)
MCH: 27.6 pg (ref 26.0–34.0)
MCHC: 32.6 g/dL (ref 32.0–36.0)
MCV: 84.7 fL (ref 80.0–100.0)
Platelets: 174 10*3/uL (ref 150–440)
RBC: 3.33 MIL/uL — ABNORMAL LOW (ref 4.40–5.90)
RDW: 18.5 % — AB (ref 11.5–14.5)
WBC: 6.6 10*3/uL (ref 3.8–10.6)

## 2018-01-18 LAB — BLOOD GAS, ARTERIAL
ACID-BASE EXCESS: 11.7 mmol/L — AB (ref 0.0–2.0)
ACID-BASE EXCESS: 10.6 mmol/L — AB (ref 0.0–2.0)
Bicarbonate: 36.6 mmol/L — ABNORMAL HIGH (ref 20.0–28.0)
Bicarbonate: 36.5 mmol/L — ABNORMAL HIGH (ref 20.0–28.0)
FIO2: 1
LHR: 30 {breaths}/min
O2 SAT: 99.1 %
O2 Saturation: 95.3 %
PCO2 ART: 48 mmHg (ref 32.0–48.0)
PCO2 ART: 55 mmHg — AB (ref 32.0–48.0)
PEEP/CPAP: 8 cmH2O
PEEP/CPAP: 6 cmH2O
PH ART: 7.49 — AB (ref 7.350–7.450)
PH ART: 7.43 (ref 7.350–7.450)
Patient temperature: 37
Patient temperature: 37
Pressure control: 40 cmH2O
RATE: 15 resp/min
VT: 500 mL
pO2, Arterial: 71 mmHg — ABNORMAL LOW (ref 83.0–108.0)
pO2, Arterial: 132 mmHg — ABNORMAL HIGH (ref 83.0–108.0)

## 2018-01-18 LAB — EXPECTORATED SPUTUM ASSESSMENT W REFEX TO RESP CULTURE

## 2018-01-18 LAB — MAGNESIUM: MAGNESIUM: 1.8 mg/dL (ref 1.7–2.4)

## 2018-01-18 LAB — EXPECTORATED SPUTUM ASSESSMENT W GRAM STAIN, RFLX TO RESP C

## 2018-01-18 LAB — MRSA PCR SCREENING: MRSA by PCR: POSITIVE — AB

## 2018-01-18 LAB — PHOSPHORUS: PHOSPHORUS: 3.3 mg/dL (ref 2.5–4.6)

## 2018-01-18 LAB — PROCALCITONIN: Procalcitonin: 0.27 ng/mL

## 2018-01-18 MED ORDER — ALBUTEROL SULFATE (2.5 MG/3ML) 0.083% IN NEBU
2.5000 mg | INHALATION_SOLUTION | Freq: Once | RESPIRATORY_TRACT | Status: AC
  Administered 2018-01-18: 2.5 mg via RESPIRATORY_TRACT
  Filled 2018-01-18: qty 3

## 2018-01-18 MED ORDER — VANCOMYCIN HCL 10 G IV SOLR
1250.0000 mg | Freq: Once | INTRAVENOUS | Status: AC
  Administered 2018-01-18: 1250 mg via INTRAVENOUS
  Filled 2018-01-18: qty 1250

## 2018-01-18 MED ORDER — STERILE WATER FOR INJECTION IJ SOLN
INTRAMUSCULAR | Status: AC
  Filled 2018-01-18: qty 10

## 2018-01-18 MED ORDER — HYDRALAZINE HCL 20 MG/ML IJ SOLN
10.0000 mg | INTRAMUSCULAR | Status: DC | PRN
  Administered 2018-01-18 – 2018-01-19 (×3): 20 mg via INTRAVENOUS
  Administered 2018-01-27: 10 mg via INTRAVENOUS
  Administered 2018-01-27: 20 mg via INTRAVENOUS
  Filled 2018-01-18 (×5): qty 1

## 2018-01-18 MED ORDER — METHYLPREDNISOLONE SODIUM SUCC 125 MG IJ SOLR
60.0000 mg | Freq: Four times a day (QID) | INTRAMUSCULAR | Status: DC
  Administered 2018-01-18 – 2018-01-20 (×6): 60 mg via INTRAVENOUS
  Filled 2018-01-18 (×6): qty 2

## 2018-01-18 MED ORDER — FUROSEMIDE 10 MG/ML IJ SOLN
40.0000 mg | Freq: Once | INTRAMUSCULAR | Status: AC
  Administered 2018-01-18: 40 mg via INTRAVENOUS
  Filled 2018-01-18: qty 4

## 2018-01-18 MED ORDER — DIGOXIN 125 MCG PO TABS
0.1250 mg | ORAL_TABLET | Freq: Every day | ORAL | Status: DC
  Administered 2018-01-18 – 2018-01-19 (×2): 0.125 mg via ORAL
  Filled 2018-01-18 (×3): qty 1

## 2018-01-18 MED ORDER — VANCOMYCIN HCL 10 G IV SOLR
1250.0000 mg | Freq: Three times a day (TID) | INTRAVENOUS | Status: DC
  Administered 2018-01-19 (×3): 1250 mg via INTRAVENOUS
  Filled 2018-01-18 (×5): qty 1250

## 2018-01-18 MED ORDER — SODIUM CHLORIDE 0.9 % IV SOLN
2.0000 g | Freq: Three times a day (TID) | INTRAVENOUS | Status: DC
  Administered 2018-01-18 – 2018-01-20 (×5): 2 g via INTRAVENOUS
  Filled 2018-01-18 (×7): qty 2

## 2018-01-18 MED ORDER — METHYLPREDNISOLONE SODIUM SUCC 125 MG IJ SOLR
60.0000 mg | Freq: Once | INTRAMUSCULAR | Status: AC
  Administered 2018-01-18: 60 mg via INTRAVENOUS

## 2018-01-18 MED ORDER — METHYLPREDNISOLONE SODIUM SUCC 125 MG IJ SOLR
INTRAMUSCULAR | Status: AC
  Administered 2018-01-18: 60 mg via INTRAVENOUS
  Filled 2018-01-18: qty 2

## 2018-01-18 MED ORDER — VECURONIUM BROMIDE 10 MG IV SOLR: 10.0000 mg | Freq: Once | INTRAVENOUS | Status: DC

## 2018-01-18 MED ORDER — STERILE WATER FOR INJECTION IJ SOLN
INTRAMUSCULAR | Status: AC
  Administered 2018-01-18: 16:00:00
  Filled 2018-01-18: qty 10

## 2018-01-18 MED ORDER — STERILE WATER FOR INJECTION IJ SOLN
INTRAMUSCULAR | Status: AC
  Administered 2018-01-18: 08:00:00
  Filled 2018-01-18: qty 10

## 2018-01-18 NOTE — Progress Notes (Signed)
Pharmacy Antibiotic Note  Zorita PangJames Tan is a 57 y.o. male admitted on 01/06/2018 with pneumonia.  Pharmacy has been consulted for cefepime and vancomycin dosing.  Plan: 1. Cefepime 2 gm IV Q8H 2. Vancomycin 1.25 gm IV x 1 followed in approximately 6 hours (stacked dosing) by vancomycin 1.25 gm IV Q8H, predicted trough 15 mcg/mL. Pharmacy will continue to follow and adjust as needed to maintain trough 15 to 20 mcg/mL.    Vd 67.1 L, Ke 0.115 hr-1, T1/2 6 hr  Height: 5\' 10"  (177.8 cm) Weight: 286 lb 13.1 oz (130.1 kg) IBW/kg (Calculated) : 73  Temp (24hrs), Avg:98.9 F (37.2 C), Min:97.5 F (36.4 C), Max:100.8 F (38.2 C)  Recent Labs  Lab 01/13/18 0337 01/13/18 0754 01/14/18 0355 01/15/18 0412 01/16/18 1030 01/17/18 0425 01/18/18 0526  WBC 3.9  --  5.6 5.9  --  5.3 6.6  CREATININE 1.31*  --  1.10 1.05 0.69 0.72 0.85  VANCOTROUGH  --  14*  --   --   --   --   --     Estimated Creatinine Clearance: 133.1 mL/min (by C-G formula based on SCr of 0.85 mg/dL).    Not on File  Antimicrobials this admission:   Dose adjustments this admission:   Microbiology results:  BCx:   UCx:    Sputum:    MRSA PCR:   Thank you for allowing pharmacy to be a part of this patient's care.  Carola FrostNathan A Aaden Buckman, Pharm.D., BCPS Clinical Pharmacist 01/18/2018 7:54 PM

## 2018-01-18 NOTE — Progress Notes (Addendum)
PULMONARY / CRITICAL CARE MEDICINE   Name: Tommy Mathews MRN: [id number] DOB: 1960-10-21    ADMISSION DATE:  01/06/2018   PT PROFILE:  55 M SNF resident due to psychiatric illness and history of TBI admitted with severe bilateral pneumonia, severe sepsis/septic shock.  Intubated in the ED shortly after arrival.  Was also noted to have dark coffee-ground material suctioned from stomach.  Patient chronically on rivaroxaban.   MAJOR EVENTS/TEST RESULTS: 04/08 Admission as above 04/08 CT chest: Multifocal severe pneumonia with RUL collapse and soft tissue obstructing RUL bronchus. 04/08 CTAP: Mild small bowel ileus, potential enteritis. Nasogastric tube terminates in distal stomach. Cholelithiasis without CT findings of acute cholecystitis.Consider bronchoscopy.  04/09 nephrology consultation: No acute indication for dialysis at present. 04/09 gastroenterology consultation: Recommend high-dose PPI therapy. 04/10 severely hypoxemic despite 100% FiO2.  Recruitment maneuver with significant improvement.  PEEP increased and ARDS strategy on ventilator implemented.  Neuromuscular blockade implemented due to ventilator dyssynchrony. 04/11 PAF with RVR > converted with amiodarone. Gas exchange improving. Off Nimbex 04/13 High airway pressure with vent dyssynchony; given Vecuronium; FIO2 increased to 50%  4/13 Amiodarone D/C because of SB 4/15 increased HR, EF 20% afib with RVR 4/16 remains intubated 4/17 failed weaning trials, HR remains elevated 4/20 no significant changes in the last 24 hours. Has not tolerated any weaning trials Amma pending tracheostomy on Monday  INDWELLING DEVICES:: ETT 04/08 >>  L femoral CVL 04/08 >> 4/16 PICC RT ARM 4/15  MICRO DATA: MRSA PCR 04/08 >> POS Urine 04/08 >> 10k coag negative staph Resp 04/09 >> c/w NOF Blood 04/08 >>  Blood 04/09 >>   ANTIMICROBIALS:  Unasyn 04/08 >> 04/10 Vanc 04/08 >> 4/15 Cefepime 04/10 >> 4/15 Metronidazole 04/10 >> 4/15    SUBJECTIVE:  Patient remains intubated,sedated Remains on vent support fio2 at 40% PEEP 10 Remains critically ill Family at bedside Patient will likely need  Trach Failed SAT/SBT's ENT consulted-trach on Monday    VITAL SIGNS: BP (!) 167/89   Pulse (!) 57   Temp 98.1 F (36.7 C)   Resp (!) 30   Ht 5\' 10"  (1.778 m)   Wt 286 lb 13.1 oz (130.1 kg)   SpO2 98%   BMI 41.15 kg/m    VENTILATOR SETTINGS: Vent Mode: PCV FiO2 (%):  [50 %-65 %] 55 % Set Rate:  [30 bmp] 30 bmp Vt Set:  [400 mL] 400 mL PEEP:  [12 cmH20] 12 cmH20  INTAKE / OUTPUT: I/O last 3 completed shifts: In: 5003.4 [I.V.:2768.4; NG/GT:2135; IV Piggyback:100] Out: 2530 [Urine:2530]  PHYSICAL EXAMINATION: General: RASS -4, -5. Sedated, synchronous with ventilator Neuro: PERRLA, EOMI, DTRs symmetric, no spontaneous movement HEENT: PERRL, sclerae white Cardiovascular: RRR, no M Lungs: bilateral rhonchi anteriorly, no wheezes Abdomen: Mildly distended, bowel sounds present Extremities: Warm, 2+ edema upper > lower Skin: No lesions noted  LABS:  BMET Recent Labs  Lab 01/16/18 1030 01/16/18 2332 01/17/18 0425 01/18/18 0526  NA 149*  --  150* 149*  K 2.8* 3.4* 3.6 4.3  CL 119*  --  115* 112*  CO2 28  --  31 35*  BUN 30*  --  32* 32*  CREATININE 0.69  --  0.72 0.85  GLUCOSE 191*  --  160* 161*    Electrolytes Recent Labs  Lab 01/16/18 1030 01/16/18 2332 01/17/18 0425 01/18/18 0526  CALCIUM 6.3*  --  6.9* 7.7*  MG 1.5*  --  1.6* 1.8  PHOS 1.4* 1.8* 2.8 3.3    CBC  Recent Labs  Lab 01/15/18 0412 01/17/18 0425 01/18/18 0526  WBC 5.9 5.3 6.6  HGB 9.9* 9.1* 9.2*  HCT 30.1* 27.9* 28.2*  PLT 151 172 174    Coag's No results for input(s): APTT, INR in the last 168 hours.  Sepsis Markers No results for input(s): LATICACIDVEN, PROCALCITON, O2SATVEN in the last 168 hours.  ABG Recent Labs  Lab 01/16/18 2148 01/17/18 0501 01/18/18 0626  PHART 7.48* 7.41 7.49*  PCO2ART 49* 59* 48   PO2ART 68* 150* 71*    Liver Enzymes Recent Labs  Lab 01/13/18 0337 01/16/18 1030 01/18/18 0526  AST  --   --  22  ALT  --   --  20  ALKPHOS  --   --  74  BILITOT  --   --  0.3  ALBUMIN 2.2* 1.4* 1.7*    Cardiac Enzymes No results for input(s): TROPONINI, PROBNP in the last 168 hours.  Glucose Recent Labs  Lab 01/17/18 1143 01/17/18 1557 01/17/18 1941 01/17/18 2341 01/18/18 0330 01/18/18 0723  GLUCAP 139* 153* 123* 148* 147* 142*    CXR: No new film    ASSESSMENT / PLAN: 57 yo AAM admitted for severe resp failure from severe b/l pneumonia with ARDS with septic shock and multiorgan failure with severe Systolic CHF now failure to wean from vent    PULMONARY A: Acute hypoxemic respiratory failure HCAP, NOS -possible aspiration ARDS P:   Cont full vent support -continue lung protection strategy.   Cont vent bundle Daily SBT if/when meets criteria Pan for Trach ENT to be consulted-trach on monday  CARDIOVASCULAR A: septic shock. Off of pressors and antibiotics. Improved hemodynamics P:  MAP goal > 65 mmHg Continue empiric hydrocortisone Echocardiogram-very  poor LVF, EF 20-25% Tachyarrhythmias- follow up cardiology recs  RENAL A:   AKI BUN/creatinine are stable at 32/0.85. Avoiding nephrotoxic agents, following closely  GASTROINTESTINAL A:   Abdominal distention, improved  P:   SUP: IV PPI  TF protocol at goal  HEMATOLOGIC Anemia. No evidence of active bleeding will follow  INFECTIOUS A:   Severe sepsis with shock-resolved Community acquired pneumonia P:   Monitor temp, WBC count Micro and abx as above   ENDOCRINE A:   Type 2 diabetes Mild steroid and stress induced hyperglycemia P:   Initiate SSI, moderate scale  NEUROLOGIC A:   Recurrent Ventilator dyssynchrony,   ICU/ventilator associated discomfort Agitation today even on Fentanyl and Propofol drips History of schizophrenia History of TBI P:   RASS goal: -2, -3 Continue  fentanyl and propofol Wean sedation  Patient is unlikely to improve over the weekend in terms of ventilatory status. Pending tracheostomy  Critical Care Time 35 minutes   Tora KindredJohn Xavion Muscat, DO   Patient ID: Tommy Mathews, male   DOB: [birth year], 57 y.o.   MRN: [id number]

## 2018-01-18 NOTE — Progress Notes (Signed)
[employer] -  at [facility]   PATIENT NAME: Tommy Mathews    MR#:  [id number]  DATE OF BIRTH:  May 31, 1961  SUBJECTIVE:   Patient remains intubated, sedated.  Plan for tracheostomy on Monday. No other acute events overnight.  REVIEW OF SYSTEMS:    Review of Systems  Unable to perform ROS: Intubated    Nutrition: Tube feeds Tolerating Diet: Yes Tolerating PT: sedated & intubated.      DRUG ALLERGIES:  Not on File  VITALS:  Blood pressure (!) 158/79, pulse 63, temperature 98.2 F (36.8 C), resp. rate (!) 26, height 5\' 10"  (1.778 m), weight 130.1 kg (286 lb 13.1 oz), SpO2 92 %.  PHYSICAL EXAMINATION:   Physical Exam  GENERAL:  57 y.o.-year-old critically ill patient lying in bed intubated & sedated EYES: Pupils equal, round, reactive to light. No scleral icterus.  HEENT: Head atraumatic, normocephalic. ET and OG tubes in place.  NECK:  Supple, no jugular venous distention. No thyroid enlargement, no tenderness.  LUNGS: Poor Resp. effort, no wheezing, rales, rhonchi. No use of accessory muscles of respiration.  CARDIOVASCULAR: S1, S2 normal. No murmurs, rubs, or gallops.  ABDOMEN: Soft, nontender, nondistended. Bowel sounds present. No organomegaly or mass.  EXTREMITIES: No cyanosis, clubbing or edema b/l.    NEUROLOGIC: Sedated & intubated PSYCHIATRIC: Sedated & intubated.   SKIN: No obvious rash, lesion, or ulcer.    LABORATORY PANEL:   CBC Recent Labs  Lab 01/18/18 0526  WBC 6.6  HGB 9.2*  HCT 28.2*  PLT 174   ------------------------------------------------------------------------------------------------------------------  Chemistries  Recent Labs  Lab 01/18/18 0526  NA 149*  K 4.3  CL 112*  CO2 35*  GLUCOSE 161*  BUN 32*  CREATININE 0.85  CALCIUM 7.7*  MG 1.8  AST 22  ALT 20  ALKPHOS 74  BILITOT 0.3    ------------------------------------------------------------------------------------------------------------------  Cardiac Enzymes No results for input(s): TROPONINI in the last 168 hours. ------------------------------------------------------------------------------------------------------------------  RADIOLOGY:  Dg Chest Port 1 View  Result Date: 01/18/2018 CLINICAL DATA:  Acute respiratory failure. EXAM: PORTABLE CHEST 1 VIEW COMPARISON:  01/16/2018 and CT chest 01/06/2018. FINDINGS: Endotracheal tube terminates 4.9 cm above the carina. Nasogastric tube is followed into the stomach. Right PICC tip projects over the SVC RA junction. Heart size within normal limits. Lungs are somewhat low in volume with diffuse mixed interstitial and airspace opacification, possibly mildly worsened from 01/16/2018. There may be right middle lobe and left lower lobe consolidation. No definite pleural fluid. IMPRESSION: Mixed interstitial and airspace opacification, possibly slightly worsened from 01/16/2018, with possible consolidation in the right middle and left lower lobes. Findings are indicative of persistent pneumonia, especially when compared with 01/06/2018. Electronically Signed   By: Leanna Battles M.D.   On: 01/18/2018 08:12   Dg Chest Port 1 View  Result Date: 01/16/2018 CLINICAL DATA:  Hypoxia and acute respiratory failure. EXAM: PORTABLE CHEST 1 VIEW COMPARISON:  None. FINDINGS: Cardiomediastinal silhouette is unchanged. An endotracheal tube with tip 3.2 cm above the carina, NG tube entering the stomach, RIGHT PICC line with tip overlying the SUPERIOR cavoatrial junction are again noted. Interstitial/airspace opacities are again noted with slight improved aeration on the LEFT. Slightly increased RIGHT UPPER lobe atelectasis noted. There is no evidence of pneumothorax. IMPRESSION: Slightly increased RIGHT UPPER lobe atelectasis and slightly improved LEFT lung aeration. Otherwise no significant  change. Electronically Signed   By: Harmon Pier M.D.   On: 01/16/2018 20:49     ASSESSMENT AND PLAN:  57 yo hx of HTN, Hyperlipidemia, GERD, Diabetes, who presented to the hospital for shortness of breath and noted to be in septic shock secondary to pneumonia.  1. acute severe septic shock -Patient is now off vasopressors.  Patient has finished treatment of underlying pneumonia UTI with IV antibiotics.  2. acutepneumonia/UTI -Patient has finished treatment with IV antibiotics for the pneumonia and UTI.  3.GI bleed Stable Continue to hold Xarelto,continuePPI  4. acute respiratory failure with hypoxia-this was secondary to pneumonia.  Patient was difficult to wean off the ventilator. -Seen by ENT and plan for tracheostomy tomorrow. -Continue supportive care as per pulmonary and aggressive pulmonary toileting for now.  Continue Pulmicort nebs, duo nebs.  5.  Atrial fibrillation with rapid ventricular response-patient has now converted to a sinus rhythm with the amiodarone drip. - off Amio gtt and placed on Oral Amio for now. Cont. Digoxin.   6.AKI-resolved and creatinine close to baseline.  7. chronic diabetes mellitus type 2 - cont. SSI and follow BS  8. Depression - cont. Prozac/Depakote.   ?? Placement to LTAC after Trach placed on Monday.   All the records are reviewed and case discussed with Care Management/Social Worker. Management plans discussed with the patient, family and they are in agreement.  CODE STATUS: Full code  DVT Prophylaxis: Lovenox  TOTAL TIME TAKING CARE OF THIS PATIENT: 25 minutes.   POSSIBLE D/C unclear DEPENDING ON CLINICAL CONDITION.   [name],VIVEK J M.D on 01/18/2018 at 2:30 PM  Between 7am to 6pm - Pager - [phone]  After 6pm go to [email or web address] - Social research officer, governmentpassword [email or web address] [email or web address]  [employer] [facility]  Office  [phone]  CC: Primary care physician; Patient, [name]

## 2018-01-18 NOTE — Progress Notes (Signed)
Pulmonary/critical care attending  Called to see patient secondary to increased respiratory distress Thick copious secretions noted, difficulty started after bath Patient was taken off the ventilator and bagged Secretions were suctioned Patient given benzodiazepine and vecuronium, stat portable chest x-ray reveals no evidence of pneumothorax with bilateral alveolar infiltrates with effusions.  Given Solu-Medrol, albuterol, Atrovent. Switched to volume control were peak and plateau pressures were measured to be 46 and 35.  Consistent with both compliance and airway resistance issues Changed back to PRBC initially on 35 however inadequate tidal volumes.  Change to pressure control and increased to 45 while decreasing the PEEP since oxygenation had improved. We will follow alveolar pressures, give diuresis, bronchodilators, steroids, paralysis and follow closely  Tora KindredJohn Jeiry Birnbaum, DO  Patient ID: Tommy PangJames Mathews, male   DOB: 19-Feb-1961, 57 y.o.   MRN: [id number]

## 2018-01-18 NOTE — Progress Notes (Signed)
Pharmacy ICU Monitoring Consult:  Pharmacy consulted to assist in medication monitoring and adjustment in this 57 y.o. male admitted on 01/06/2018 with respiratory distress. Patient with history significant for TBI and admitted from group home.   Patient currently requiring mechanical ventilation and is sedated on fentanyl infusion and dexmedetomidine.   Patient completed 8 days of broad spectrum antibiotic therapy.   Labs:  Sodium (mmol/L)  Date Value  01/18/2018 149 (H)   Potassium (mmol/L)  Date Value  01/18/2018 4.3   Magnesium (mg/dL)  Date Value  81/19/147804/20/2019 1.8   Phosphorus (mg/dL)  Date Value  29/56/213004/20/2019 3.3   Calcium (mg/dL)  Date Value  86/57/846904/20/2019 7.7 (L)   Albumin (g/dL)  Date Value  62/95/284104/20/2019 1.7 (L)     Plan: 1. Electrolytes: Corrected calcium in range. Patient started on D5 @ 7550mL/hr for hypernatremia. K, Mag, Phos WNL today. No electrolyte consult.    2. Diabetes: BG 123-153. Watch blood glucose while on D5. Will continue Lantus 15 units Daily an Q4hr SSI.   3. Constipation: Last bowel movement was 4/15. Continuesenna/docusate 2 tabs BID for constipation.  4. Amiodarone medication monitoring:  Amiodarone transitioned to amiodarone 200mg  PO BID on 4/19. Pt on amiodarone and digoxin 0.25 mg IV daily. Spoke to ICU MD; digoxin adjusted to 0.125 mg PO daily.  Pharmacy will continue to monitor and adjust per consult.   Marty HeckWang, Elson Ulbrich L 01/18/2018 9:21 AM

## 2018-01-19 ENCOUNTER — Inpatient Hospital Stay: Payer: Medicaid Other

## 2018-01-19 LAB — GLUCOSE, CAPILLARY
GLUCOSE-CAPILLARY: 242 mg/dL — AB (ref 65–99)
GLUCOSE-CAPILLARY: 246 mg/dL — AB (ref 65–99)
GLUCOSE-CAPILLARY: 276 mg/dL — AB (ref 65–99)
GLUCOSE-CAPILLARY: 287 mg/dL — AB (ref 65–99)
Glucose-Capillary: 305 mg/dL — ABNORMAL HIGH (ref 65–99)
Glucose-Capillary: 258 mg/dL — ABNORMAL HIGH (ref 65–99)

## 2018-01-19 LAB — CBC WITH DIFFERENTIAL/PLATELET
BAND NEUTROPHILS: 2 %
BASOS PCT: 0 %
Basophils Absolute: 0 10*3/uL (ref 0–0.1)
Blasts: 0 %
Eosinophils Absolute: 0 10*3/uL (ref 0–0.7)
Eosinophils Relative: 0 %
HEMATOCRIT: 31.3 % — AB (ref 40.0–52.0)
Hemoglobin: 10.3 g/dL — ABNORMAL LOW (ref 13.0–18.0)
LYMPHS ABS: 0.9 10*3/uL — AB (ref 1.0–3.6)
Lymphocytes Relative: 11 %
MCH: 27.2 pg (ref 26.0–34.0)
MCHC: 32.8 g/dL (ref 32.0–36.0)
MCV: 82.8 fL (ref 80.0–100.0)
Metamyelocytes Relative: 3 %
Monocytes Absolute: 0.3 10*3/uL (ref 0.2–1.0)
Monocytes Relative: 3 %
Myelocytes: 2 %
NEUTROS PCT: 79 %
NRBC: 4 /100{WBCs} — AB
Neutro Abs: 7.2 10*3/uL — ABNORMAL HIGH (ref 1.4–6.5)
Other: 0 %
PLATELETS: 187 10*3/uL (ref 150–440)
PROMYELOCYTES RELATIVE: 0 %
RBC: 3.78 MIL/uL — ABNORMAL LOW (ref 4.40–5.90)
RDW: 18.3 % — AB (ref 11.5–14.5)
WBC: 8.4 10*3/uL (ref 3.8–10.6)

## 2018-01-19 LAB — PROCALCITONIN: PROCALCITONIN: 0.22 ng/mL

## 2018-01-19 LAB — BASIC METABOLIC PANEL
Anion gap: 9 (ref 5–15)
BUN: 37 mg/dL — ABNORMAL HIGH (ref 6–20)
CALCIUM: 7.9 mg/dL — AB (ref 8.9–10.3)
CO2: 28 mmol/L (ref 22–32)
CREATININE: 0.89 mg/dL (ref 0.61–1.24)
Chloride: 109 mmol/L (ref 101–111)
GFR calc non Af Amer: >60 mL/min (ref >60)
Glucose, Bld: 294 mg/dL — ABNORMAL HIGH (ref 65–99)
Potassium: 3.7 mmol/L (ref 3.5–5.1)
Sodium: 146 mmol/L — ABNORMAL HIGH (ref 135–145)

## 2018-01-19 LAB — TRIGLYCERIDES: Triglycerides: 75 mg/dL (ref <150)

## 2018-01-19 MED ORDER — STERILE WATER FOR INJECTION IJ SOLN
INTRAMUSCULAR | Status: AC
  Filled 2018-01-19: qty 10

## 2018-01-19 MED ORDER — PROPOFOL 1000 MG/100ML IV EMUL
5.0000 ug/kg/min | INTRAVENOUS | Status: DC
  Administered 2018-01-19 (×2): 30 ug/kg/min via INTRAVENOUS
  Administered 2018-01-19 (×2): 35 ug/kg/min via INTRAVENOUS
  Administered 2018-01-19: 40 ug/kg/min via INTRAVENOUS
  Administered 2018-01-19: 30 ug/kg/min via INTRAVENOUS
  Administered 2018-01-20: 35 ug/kg/min via INTRAVENOUS
  Administered 2018-01-20: 33 ug/kg/min via INTRAVENOUS
  Filled 2018-01-19 (×7): qty 100

## 2018-01-19 MED ORDER — FLUOXETINE HCL 20 MG PO CAPS
40.0000 mg | ORAL_CAPSULE | Freq: Every day | ORAL | Status: DC
  Administered 2018-01-19: 40 mg
  Filled 2018-01-19 (×2): qty 2

## 2018-01-19 MED ORDER — INSULIN GLARGINE 100 UNIT/ML ~~LOC~~ SOLN
25.0000 [IU] | Freq: Every day | SUBCUTANEOUS | Status: DC
  Administered 2018-01-19 – 2018-01-21 (×3): 25 [IU] via SUBCUTANEOUS
  Filled 2018-01-19 (×5): qty 0.25

## 2018-01-19 NOTE — Progress Notes (Signed)
PULMONARY / CRITICAL CARE MEDICINE   Name: Tommy Mathews MRN: [id number] DOB: 1961-02-20    ADMISSION DATE:  01/06/2018   PT PROFILE:  9 M SNF resident due to psychiatric illness and history of TBI admitted with severe bilateral pneumonia, severe sepsis/septic shock.  Intubated in the ED shortly after arrival.  Was also noted to have dark coffee-ground material suctioned from stomach.  Patient chronically on rivaroxaban.   MAJOR EVENTS/TEST RESULTS: 04/08 Admission as above 04/08 CT chest: Multifocal severe pneumonia with RUL collapse and soft tissue obstructing RUL bronchus. 04/08 CTAP: Mild small bowel ileus, potential enteritis. Nasogastric tube terminates in distal stomach. Cholelithiasis without CT findings of acute cholecystitis.Consider bronchoscopy.  04/09 nephrology consultation: No acute indication for dialysis at present. 04/09 gastroenterology consultation: Recommend high-dose PPI therapy. 04/10 severely hypoxemic despite 100% FiO2.  Recruitment maneuver with significant improvement.  PEEP increased and ARDS strategy on ventilator implemented.  Neuromuscular blockade implemented due to ventilator dyssynchrony. 04/11 PAF with RVR > converted with amiodarone. Gas exchange improving. Off Nimbex 04/13 High airway pressure with vent dyssynchony; given Vecuronium; FIO2 increased to 50%  4/13 Amiodarone D/C because of SB 4/15 increased HR, EF 20% afib with RVR 4/16 remains intubated 4/17 failed weaning trials, HR remains elevated 4/20 no significant changes in the last 24 hours. Has not tolerated any weaning trials Amma pending tracheostomy on Monday 4/20. Last night developed worsening respiratory failure. Peak and plateau pressures revealed both decreasing compliance and increased airway resistance. Started on steroids, bronchodilators, antibiotics, given diuretic therapy. This morning lung compliance is significantly improved with decreasing airway pressure to 41 with increasing tidal  volumes to 600.  INDWELLING DEVICES:: ETT 04/08 >>  L femoral CVL 04/08 >> 4/16 PICC RT ARM 4/15  MICRO DATA: MRSA PCR 04/08 >> POS Urine 04/08 >> 10k coag negative staph Resp 04/09 >> c/w NOF Blood 04/08 >>  Blood 04/09 >>   ANTIMICROBIALS:  Unasyn 04/08 >> 04/10 Vanc 04/08 >> 4/15 Cefepime 04/10 >> 4/15 Metronidazole 04/10 >> 4/15   SUBJECTIVE:  Patient remains intubated,sedated Remains on vent support fio2 at 40% PEEP 10 Remains critically ill Family at bedside Patient will likely need  Trach Failed SAT/SBT's ENT consulted-trach on Monday    VITAL SIGNS: BP 137/70   Pulse 84   Temp 98.6 F (37 C)   Resp (!) 30   Ht 5\' 10"  (1.778 m)   Wt (!) 301 lb 5.9 oz (136.7 kg)   SpO2 97%   BMI 43.24 kg/m    VENTILATOR SETTINGS: Vent Mode: PCV FiO2 (%):  [50 %-100 %] 60 % Set Rate:  [26 bmp-30 bmp] 30 bmp Vt Set:  [500 mL] 500 mL PEEP:  [6 cmH20-8 cmH20] 6 cmH20 Plateau Pressure:  [30 cmH20-38 cmH20] 33 cmH20  INTAKE / OUTPUT: I/O last 3 completed shifts: In: 4040.2 [I.V.:2428.5; NG/GT:1011.8; IV Piggyback:600] Out: 6140 [Urine:6140]  PHYSICAL EXAMINATION: General: RASS -4, -5. Sedated, synchronous with ventilator Neuro: PERRLA, EOMI, DTRs symmetric, no spontaneous movement HEENT: PERRL, sclerae white Cardiovascular: RRR, no M Lungs: bilateral rhonchi anteriorly, no wheezes Abdomen: Mildly distended, bowel sounds present Extremities: Warm, 2+ edema upper > lower Skin: No lesions noted  LABS:  BMET Recent Labs  Lab 01/17/18 0425 01/18/18 0526 01/19/18 0347  NA 150* 149* 146*  K 3.6 4.3 3.7  CL 115* 112* 109  CO2 31 35* 28  BUN 32* 32* 37*  CREATININE 0.72 0.85 0.89  GLUCOSE 160* 161* 294*    Electrolytes Recent Labs  Lab  01/16/18 1030 01/16/18 2332 01/17/18 0425 01/18/18 0526 01/19/18 0347  CALCIUM 6.3*  --  6.9* 7.7* 7.9*  MG 1.5*  --  1.6* 1.8  --   PHOS 1.4* 1.8* 2.8 3.3  --     CBC Recent Labs  Lab 01/17/18 0425  01/18/18 0526 01/19/18 0347  WBC 5.3 6.6 8.4  HGB 9.1* 9.2* 10.3*  HCT 27.9* 28.2* 31.3*  PLT 172 174 187    Coag's No results for input(s): APTT, INR in the last 168 hours.  Sepsis Markers Recent Labs  Lab 01/18/18 0526 01/19/18 0347  PROCALCITON 0.27 0.22    ABG Recent Labs  Lab 01/17/18 0501 01/18/18 0626 01/18/18 1751  PHART 7.41 7.49* 7.43  PCO2ART 59* 48 55*  PO2ART 150* 71* 132*    Liver Enzymes Recent Labs  Lab 01/13/18 0337 01/16/18 1030 01/18/18 0526  AST  --   --  22  ALT  --   --  20  ALKPHOS  --   --  74  BILITOT  --   --  0.3  ALBUMIN 2.2* 1.4* 1.7*    Cardiac Enzymes No results for input(s): TROPONINI, PROBNP in the last 168 hours.  Glucose Recent Labs  Lab 01/18/18 1148 01/18/18 1615 01/18/18 1941 01/18/18 2333 01/19/18 0335 01/19/18 0734  GLUCAP 163* 196* 167* 204* 246* 242*    CXR: No new film    ASSESSMENT / PLAN: 57 yo AAM admitted for severe resp failure from severe b/l pneumonia with ARDS with septic shock and multiorgan failure with severe Systolic CHF now failure to wean from vent    ARDS. Patient had significant decreasing lung compliance and increasing resistance yesterday. This has improved to steroids bronchodilators and diuretic therapy. Patient is also on antibiotics. This morning peak pressure 41 resulted in a tidal volume of 600. Will decrease pressure control to target title volume of 6 mL/kg and wean FiO2 as tolerated. Patient will be sedated with fentanyl and propofol. Patient will continue on vancomycin and cefepime for pneumonia Chest x-ray has improved from yesterday  Septic shock. Patient's blood pressure has improved and is off pressors. Has a reduced ejection fraction at 20-25%   Renal failure. Stabilizing. Prerenal indices with B1 37 and creatinine 0.89   Anemia. No evidence of active bleeding will follow  Diabetes. On sliding scale coverage  Critical Care Time 35 minutes   [name],  DO   Patient ID: Tommy Mathews, male   DOB: [birth year], 57 y.o.   MRN: [id number] Patient ID: Tommy Mathews, male   DOB: [birth year], 57 y.o.   MRN: [id number]

## 2018-01-19 NOTE — Progress Notes (Addendum)
Pharmacy ICU Monitoring Consult:  Pharmacy consulted to assist in medication monitoring and adjustment in this 57 y.o. male admitted on 01/06/2018 with respiratory distress. Patient with history significant for TBI and admitted from group home.   Patient currently requiring mechanical ventilation and is sedated on fentanyl infusion and dexmedetomidine.   Patient completed 8 days of broad spectrum antibiotic therapy.   Labs:  Sodium (mmol/L)  Date Value  01/19/2018 146 (H)   Potassium (mmol/L)  Date Value  01/19/2018 3.7   Magnesium (mg/dL)  Date Value  16/10/960404/20/2019 1.8   Phosphorus (mg/dL)  Date Value  54/09/811904/20/2019 3.3   Calcium (mg/dL)  Date Value  14/78/295604/21/2019 7.9 (L)   Albumin (g/dL)  Date Value  21/30/865704/20/2019 1.7 (L)     Plan: 1. Electrolytes: Corrected calcium in range. Patient started on D5 @ 2950mL/hr for hypernatremia. K WNL today. No electrolyte consult.    2. Diabetes: BG 163>>242. Watch blood glucose while on D5. Steroids started. Required ~23 units SSI last 24h with BG not controlled.   Will increase Lantus to 25 units qHS and continue Q4hr SSI.   3. Constipation: Last bowel movement was 4/15. Continue senna/docusate 2 tabs BID for constipation.  4. Amiodarone medication monitoring:  Amiodarone transitioned to amiodarone 200mg  PO BID on 4/19. Pt on amiodarone and digoxin 0.25 mg IV daily. Spoke to ICU MD on 4/20; digoxin adjusted to 0.125 mg PO daily.  Pharmacy will continue to monitor and adjust per consult.   Marty HeckWang, Ishani Goldwasser L 01/19/2018 12:55 PM

## 2018-01-19 NOTE — Progress Notes (Signed)
Pharmacy Antibiotic Note  Tommy Mathews is a 57 y.o. male admitted on 01/06/2018 with pneumonia.  Pharmacy has been consulted for cefepime and vancomycin dosing.  Plan: 1. Cefepime 2 gm IV Q8H 2. Vancomycin 1.25 gm IV x 1 followed in approximately 6 hours (stacked dosing) by vancomycin 1.25 gm IV Q8H, predicted trough 15 mcg/mL. Pharmacy will continue to follow and adjust as needed to maintain trough 15 to 20 mcg/mL.    Vd 67.1 L, Ke 0.115 hr-1, T1/2 6 hr  4/21: Renal function about stable from yesterday, pt at risk for accumulation. Need to monitor renal function closely. SCr ordered for AM with level. If first trough higher than expected, would likely need to decrease dose.   Height: 5\' 10"  (177.8 cm) Weight: (!) 301 lb 5.9 oz (136.7 kg) IBW/kg (Calculated) : 73  Temp (24hrs), Avg:98.5 F (36.9 C), Min:97.3 F (36.3 C), Max:99.9 F (37.7 C)  Recent Labs  Lab 01/13/18 0754 01/14/18 0355 01/15/18 0412 01/16/18 1030 01/17/18 0425 01/18/18 0526 01/19/18 0347  WBC  --  5.6 5.9  --  5.3 6.6 8.4  CREATININE  --  1.10 1.05 0.69 0.72 0.85 0.89  VANCOTROUGH 14*  --   --   --   --   --   --     Estimated Creatinine Clearance: 130.7 mL/min (by C-G formula based on SCr of 0.89 mg/dL).    Not on File  Antimicrobials this admission:   Dose adjustments this admission:   Microbiology results:  Sputum:  pending  MRSA PCR: +  Thank you for allowing pharmacy to be a part of this patient's care.  Marty HeckWang, Adore Kithcart L, Pharm.D., BCPS Clinical Pharmacist 01/19/2018 1:03 PM

## 2018-01-19 NOTE — Progress Notes (Signed)
Good day. Sedation changed from Precedex to Propofol due to Precedex tolerance and poor sedative affects.. Tolerated change but still desaturated at 0900 with bath:rolling to change sheets. Scrotum and penis grossly swollen with scrotal area weeping. Stage 2 on sacral lower area(crack) looking better. Remains + 3 edema all over body. Remains ventilated but on Pressure control  60% peep 6 Rate 30 and pressure 20. Sister in briefly. Questions answered. Now SR rate 60's with a few irregular beats. Purposeful movement of arms and legs and opens eyes spontaneously when sedation weaned.

## 2018-01-19 NOTE — Progress Notes (Signed)
[employer] - [employer] at [facility]   PATIENT NAME: Tommy PangJames Nadal    MR#:  [id number]  DATE OF BIRTH:  [birth year]  SUBJECTIVE:   Patient remains intubated, sedated.  Plan for tracheostomy on Monday.  No other acute events overnight.  REVIEW OF SYSTEMS:    Review of Systems  Unable to perform ROS: Intubated    Nutrition: Tube feeds Tolerating Diet: Yes Tolerating PT: sedated & intubated.    DRUG ALLERGIES:  Not on File  VITALS:  Blood pressure 124/64, pulse 66, temperature (!) 97.5 F (36.4 C), resp. rate (!) 30, height 5\' 10"  (1.778 m), weight (!) 136.7 kg (301 lb 5.9 oz), SpO2 96 %.  PHYSICAL EXAMINATION:   Physical Exam  GENERAL:  57 y.o.-year-old critically ill patient lying in bed intubated & sedated EYES: Pupils equal, round, reactive to light. No scleral icterus.  HEENT: Head atraumatic, normocephalic. ET and OG tubes in place.  NECK:  Supple, no jugular venous distention. No thyroid enlargement, no tenderness.  LUNGS: Poor Resp. effort, no wheezing, rales, rhonchi. No use of accessory muscles of respiration.  CARDIOVASCULAR: S1, S2 normal. No murmurs, rubs, or gallops.  ABDOMEN: Soft, nontender, nondistended. Bowel sounds present. No organomegaly or mass.  EXTREMITIES: No cyanosis, clubbing or edema b/l.    NEUROLOGIC: Sedated & intubated PSYCHIATRIC: Sedated & intubated.   SKIN: No obvious rash, lesion, or ulcer.    LABORATORY PANEL:   CBC Recent Labs  Lab 01/19/18 0347  WBC 8.4  HGB 10.3*  HCT 31.3*  PLT 187   ------------------------------------------------------------------------------------------------------------------  Chemistries  Recent Labs  Lab 01/18/18 0526 01/19/18 0347  NA 149* 146*  K 4.3 3.7  CL 112* 109  CO2 35* 28  GLUCOSE 161* 294*  BUN 32* 37*  CREATININE 0.85 0.89  CALCIUM 7.7* 7.9*  MG 1.8  --   AST 22  --   ALT 20  --   ALKPHOS 74  --   BILITOT 0.3  --     ------------------------------------------------------------------------------------------------------------------  Cardiac Enzymes No results for input(s): TROPONINI in the last 168 hours. ------------------------------------------------------------------------------------------------------------------  RADIOLOGY:  Dg Chest Port 1 View  Result Date: 01/19/2018 CLINICAL DATA:  Acute respiratory failure. EXAM: PORTABLE CHEST 1 VIEW COMPARISON:  01/18/2018 FINDINGS: Endotracheal tube terminates 4 cm above the carina. Enteric tube can be followed to the GE junction but is not well visualized beyond this due to under penetration in the upper abdomen. Right PICC terminates at the cavoatrial junction. The cardiomediastinal silhouette is unchanged allowing for differences in patient rotation. Interstitial and patchy airspace opacities are again seen bilaterally with mild improvement in the right base and without significant interval change on the left. No large pleural effusion or pneumothorax is identified. IMPRESSION: Bilateral interstitial and airspace opacities, slightly improved on the right and likely reflecting pneumonia. Electronically Signed   By: Sebastian AcheAllen  Grady M.D.   On: 01/19/2018 07:49   Dg Chest Port 1 View  Result Date: 01/18/2018 CLINICAL DATA:  Oxygen desaturation EXAM: PORTABLE CHEST 1 VIEW COMPARISON:  01/18/2018 FINDINGS: Endotracheal tube, right PICC line and NG tube remain in place, unchanged. Bilateral interstitial and alveolar opacities are again noted, unchanged. Small bilateral effusions suspected. Heart is upper limits normal in size. IMPRESSION: Bilateral interstitial and airspace opacities with small effusions. No real change. Electronically Signed   By: Charlett NoseKevin  Dover M.D.   On: 01/18/2018 16:39   Dg Chest Port 1 View  Result Date: 01/18/2018 CLINICAL DATA:  Acute respiratory failure. EXAM: PORTABLE  CHEST 1 VIEW COMPARISON:  01/16/2018 and CT chest 01/06/2018. FINDINGS:  Endotracheal tube terminates 4.9 cm above the carina. Nasogastric tube is followed into the stomach. Right PICC tip projects over the SVC RA junction. Heart size within normal limits. Lungs are somewhat low in volume with diffuse mixed interstitial and airspace opacification, possibly mildly worsened from 01/16/2018. There may be right middle lobe and left lower lobe consolidation. No definite pleural fluid. IMPRESSION: Mixed interstitial and airspace opacification, possibly slightly worsened from 01/16/2018, with possible consolidation in the right middle and left lower lobes. Findings are indicative of persistent pneumonia, especially when compared with 01/06/2018. Electronically Signed   By: Leanna Battles M.D.   On: 01/18/2018 08:12     ASSESSMENT AND PLAN:   57 yo hx of HTN, Hyperlipidemia, GERD, Diabetes, who presented to the hospital for shortness of breath and noted to be in septic shock secondary to pneumonia.  1. acute severe septic shock -Patient is now off vasopressors.  Patient has finished treatment of underlying pneumonia UTI with IV antibiotics.  2. acutepneumonia/UTI -Patient has finished treatment with IV antibiotics for the pneumonia and UTI.  3.GI bleed  - Hg. Stable.   - Continue to hold Xarelto,continuePPI  4. acute respiratory failure with hypoxia-this was secondary to pneumonia.  Patient was difficult to wean off the ventilator. -Seen by ENT and plan for tracheostomy tomorrow. -Continue supportive care as per pulmonary and aggressive pulmonary toileting for now.  Continue Pulmicort nebs, duo nebs.  5.  Atrial fibrillation with rapid ventricular response-patient has now converted to a sinus rhythm with the amiodarone drip. - off Amio gtt. Cont. Oral Amio, Digoxin.   6.AKI-resolved and creatinine at baseline.  7. chronic diabetes mellitus type 2 - cont. SSI and follow BS  8. Depression - cont. Prozac/Depakote.   ?? Placement to LTAC after Trach placed  on Monday.   All the records are reviewed and case discussed with Care Management/Social Worker. Management plans discussed with the patient, family and they are in agreement.  CODE STATUS: Full code  DVT Prophylaxis: Lovenox  TOTAL TIME TAKING CARE OF THIS PATIENT: 25 minutes.   POSSIBLE D/C unclear DEPENDING ON CLINICAL CONDITION.   Houston Siren M.D on 01/19/2018 at 12:31 PM  Between 7am to 6pm - Pager - [phone]  After 6pm go to [email or web address] - Social research officer, government  [facility]  Office  [phone]  CC: Primary care physician; Patient, [name]

## 2018-01-20 DIAGNOSIS — R1310 Dysphagia, unspecified: Secondary | ICD-10-CM

## 2018-01-20 DIAGNOSIS — E118 Type 2 diabetes mellitus with unspecified complications: Secondary | ICD-10-CM

## 2018-01-20 DIAGNOSIS — I420 Dilated cardiomyopathy: Secondary | ICD-10-CM

## 2018-01-20 LAB — GLUCOSE, CAPILLARY
GLUCOSE-CAPILLARY: 207 mg/dL — AB (ref 65–99)
Glucose-Capillary: 206 mg/dL — ABNORMAL HIGH (ref 65–99)
Glucose-Capillary: 263 mg/dL — ABNORMAL HIGH (ref 65–99)
Glucose-Capillary: 268 mg/dL — ABNORMAL HIGH (ref 65–99)
Glucose-Capillary: 307 mg/dL — ABNORMAL HIGH (ref 65–99)

## 2018-01-20 LAB — COMPREHENSIVE METABOLIC PANEL
ALBUMIN: 1.7 g/dL — AB (ref 3.5–5.0)
ALK PHOS: 125 U/L (ref 38–126)
ALT: 39 U/L (ref 17–63)
ANION GAP: 8 (ref 5–15)
AST: 38 U/L (ref 15–41)
BILIRUBIN TOTAL: 0.3 mg/dL (ref 0.3–1.2)
BUN: 52 mg/dL — ABNORMAL HIGH (ref 6–20)
CO2: 30 mmol/L (ref 22–32)
Calcium: 7.5 mg/dL — ABNORMAL LOW (ref 8.9–10.3)
Chloride: 108 mmol/L (ref 101–111)
Creatinine, Ser: 1.05 mg/dL (ref 0.61–1.24)
GFR calc non Af Amer: >60 mL/min (ref >60)
GLUCOSE: 334 mg/dL — AB (ref 65–99)
POTASSIUM: 3.6 mmol/L (ref 3.5–5.1)
Sodium: 146 mmol/L — ABNORMAL HIGH (ref 135–145)
TOTAL PROTEIN: 5.2 g/dL — AB (ref 6.5–8.1)

## 2018-01-20 LAB — MAGNESIUM: MAGNESIUM: 1.7 mg/dL (ref 1.7–2.4)

## 2018-01-20 LAB — PROTIME-INR
INR: 1.04
PROTHROMBIN TIME: 13.5 s (ref 11.4–15.2)

## 2018-01-20 LAB — CREATININE, SERUM: CREATININE: 1.05 mg/dL (ref 0.61–1.24)

## 2018-01-20 LAB — PHOSPHORUS: PHOSPHORUS: 3.6 mg/dL (ref 2.5–4.6)

## 2018-01-20 LAB — PROCALCITONIN: Procalcitonin: 0.43 ng/mL

## 2018-01-20 LAB — VANCOMYCIN, TROUGH: Vancomycin Tr: 26 ug/mL (ref 15–20)

## 2018-01-20 MED ORDER — POTASSIUM CHLORIDE 20 MEQ PO PACK
40.0000 meq | PACK | Freq: Two times a day (BID) | ORAL | Status: DC
  Administered 2018-01-20: 40 meq
  Filled 2018-01-20: qty 2

## 2018-01-20 MED ORDER — POTASSIUM CHLORIDE 20 MEQ/15ML (10%) PO SOLN
40.0000 meq | Freq: Two times a day (BID) | ORAL | Status: DC
  Filled 2018-01-20: qty 30

## 2018-01-20 MED ORDER — VANCOMYCIN HCL 10 G IV SOLR
1250.0000 mg | Freq: Two times a day (BID) | INTRAVENOUS | Status: DC
  Administered 2018-01-20: 1250 mg via INTRAVENOUS
  Filled 2018-01-20 (×2): qty 1250

## 2018-01-20 MED ORDER — LORAZEPAM 2 MG/ML IJ SOLN
1.0000 mg | INTRAMUSCULAR | Status: DC | PRN
  Filled 2018-01-20: qty 1

## 2018-01-20 MED ORDER — SPIRONOLACTONE 25 MG PO TABS
100.0000 mg | ORAL_TABLET | Freq: Once | ORAL | Status: AC
  Administered 2018-01-20: 100 mg
  Filled 2018-01-20: qty 4

## 2018-01-20 MED ORDER — ENOXAPARIN SODIUM 40 MG/0.4ML ~~LOC~~ SOLN
40.0000 mg | Freq: Every day | SUBCUTANEOUS | Status: DC
Start: 2018-01-21 — End: 2018-01-22
  Administered 2018-01-21 – 2018-01-22 (×2): 40 mg via SUBCUTANEOUS
  Filled 2018-01-20 (×2): qty 0.4

## 2018-01-20 MED ORDER — FREE WATER
200.0000 mL | Status: DC
  Administered 2018-01-20 – 2018-01-22 (×15): 200 mL

## 2018-01-20 MED ORDER — FENTANYL 2500MCG IN NS 250ML (10MCG/ML) PREMIX INFUSION
0.0000 ug/h | INTRAVENOUS | Status: DC
  Administered 2018-01-20: 300 ug/h via INTRAVENOUS
  Administered 2018-01-21: 350 ug/h via INTRAVENOUS
  Administered 2018-01-21 (×2): 300 ug/h via INTRAVENOUS
  Administered 2018-01-22: 350 ug/h via INTRAVENOUS
  Administered 2018-01-22 – 2018-01-23 (×3): 250 ug/h via INTRAVENOUS
  Filled 2018-01-20 (×9): qty 250

## 2018-01-20 MED ORDER — IPRATROPIUM-ALBUTEROL 0.5-2.5 (3) MG/3ML IN SOLN
3.0000 mL | Freq: Four times a day (QID) | RESPIRATORY_TRACT | Status: DC
  Administered 2018-01-20 – 2018-01-28 (×32): 3 mL via RESPIRATORY_TRACT
  Filled 2018-01-20 (×31): qty 3

## 2018-01-20 MED ORDER — INSULIN ASPART 100 UNIT/ML ~~LOC~~ SOLN
0.0000 [IU] | SUBCUTANEOUS | Status: DC
  Administered 2018-01-20 (×2): 7 [IU] via SUBCUTANEOUS
  Administered 2018-01-20: 11 [IU] via SUBCUTANEOUS
  Administered 2018-01-21: 4 [IU] via SUBCUTANEOUS
  Administered 2018-01-21 (×2): 7 [IU] via SUBCUTANEOUS
  Administered 2018-01-21: 4 [IU] via SUBCUTANEOUS
  Administered 2018-01-21: 3 [IU] via SUBCUTANEOUS
  Administered 2018-01-22: 4 [IU] via SUBCUTANEOUS
  Administered 2018-01-22 – 2018-01-23 (×6): 3 [IU] via SUBCUTANEOUS
  Administered 2018-01-23: 4 [IU] via SUBCUTANEOUS
  Administered 2018-01-23 (×2): 3 [IU] via SUBCUTANEOUS
  Administered 2018-01-24: 4 [IU] via SUBCUTANEOUS
  Administered 2018-01-24 – 2018-01-25 (×5): 3 [IU] via SUBCUTANEOUS
  Administered 2018-01-25 (×4): 4 [IU] via SUBCUTANEOUS
  Administered 2018-01-26: 3 [IU] via SUBCUTANEOUS
  Administered 2018-01-26: 4 [IU] via SUBCUTANEOUS
  Administered 2018-01-26: 7 [IU] via SUBCUTANEOUS
  Administered 2018-01-27 (×3): 4 [IU] via SUBCUTANEOUS
  Administered 2018-01-28 (×2): 3 [IU] via SUBCUTANEOUS
  Administered 2018-01-28: 4 [IU] via SUBCUTANEOUS
  Administered 2018-01-28 (×2): 3 [IU] via SUBCUTANEOUS
  Administered 2018-01-29 (×3): 4 [IU] via SUBCUTANEOUS
  Administered 2018-01-29 – 2018-01-30 (×3): 3 [IU] via SUBCUTANEOUS
  Administered 2018-01-30: 4 [IU] via SUBCUTANEOUS
  Administered 2018-01-31 (×2): 3 [IU] via SUBCUTANEOUS
  Administered 2018-01-31: 4 [IU] via SUBCUTANEOUS
  Administered 2018-01-31: 3 [IU] via SUBCUTANEOUS
  Administered 2018-01-31: 4 [IU] via SUBCUTANEOUS
  Administered 2018-02-02 – 2018-02-03 (×4): 3 [IU] via SUBCUTANEOUS
  Administered 2018-02-03 – 2018-02-04 (×5): 4 [IU] via SUBCUTANEOUS
  Administered 2018-02-04: 3 [IU] via SUBCUTANEOUS
  Administered 2018-02-04 (×2): 4 [IU] via SUBCUTANEOUS
  Administered 2018-02-04: 3 [IU] via SUBCUTANEOUS
  Administered 2018-02-05 – 2018-02-07 (×12): 4 [IU] via SUBCUTANEOUS
  Administered 2018-02-07 (×3): 3 [IU] via SUBCUTANEOUS
  Administered 2018-02-07: 4 [IU] via SUBCUTANEOUS
  Administered 2018-02-07: 3 [IU] via SUBCUTANEOUS
  Administered 2018-02-08: 4 [IU] via SUBCUTANEOUS
  Administered 2018-02-08 – 2018-02-09 (×5): 3 [IU] via SUBCUTANEOUS
  Administered 2018-02-09 (×3): 4 [IU] via SUBCUTANEOUS
  Administered 2018-02-09: 3 [IU] via SUBCUTANEOUS
  Administered 2018-02-09 – 2018-02-10 (×7): 4 [IU] via SUBCUTANEOUS
  Administered 2018-02-11: 3 [IU] via SUBCUTANEOUS
  Administered 2018-02-11 (×2): 4 [IU] via SUBCUTANEOUS
  Administered 2018-02-11 (×2): 3 [IU] via SUBCUTANEOUS
  Administered 2018-02-11 – 2018-02-12 (×2): 4 [IU] via SUBCUTANEOUS
  Administered 2018-02-12: 3 [IU] via SUBCUTANEOUS
  Administered 2018-02-12: 4 [IU] via SUBCUTANEOUS
  Administered 2018-02-12 (×2): 3 [IU] via SUBCUTANEOUS
  Administered 2018-02-12 (×2): 4 [IU] via SUBCUTANEOUS
  Administered 2018-02-13: 3 [IU] via SUBCUTANEOUS
  Administered 2018-02-13 (×2): 4 [IU] via SUBCUTANEOUS
  Administered 2018-02-13: 3 [IU] via SUBCUTANEOUS
  Administered 2018-02-14: 4 [IU] via SUBCUTANEOUS
  Administered 2018-02-14: 7 [IU] via SUBCUTANEOUS
  Administered 2018-02-14: 3 [IU] via SUBCUTANEOUS
  Administered 2018-02-14 – 2018-02-17 (×15): 4 [IU] via SUBCUTANEOUS
  Administered 2018-02-17: 3 [IU] via SUBCUTANEOUS
  Administered 2018-02-17 – 2018-02-18 (×6): 4 [IU] via SUBCUTANEOUS
  Administered 2018-02-18: 7 [IU] via SUBCUTANEOUS
  Administered 2018-02-18 – 2018-02-19 (×2): 4 [IU] via SUBCUTANEOUS
  Administered 2018-02-19 (×2): 7 [IU] via SUBCUTANEOUS
  Administered 2018-02-19 – 2018-02-20 (×4): 3 [IU] via SUBCUTANEOUS
  Administered 2018-02-20: 4 [IU] via SUBCUTANEOUS
  Administered 2018-02-20: 3 [IU] via SUBCUTANEOUS
  Administered 2018-02-21: 4 [IU] via SUBCUTANEOUS
  Administered 2018-02-21 (×2): 3 [IU] via SUBCUTANEOUS
  Administered 2018-02-21: 4 [IU] via SUBCUTANEOUS
  Administered 2018-02-21 (×2): 3 [IU] via SUBCUTANEOUS
  Administered 2018-02-22: 4 [IU] via SUBCUTANEOUS
  Administered 2018-02-22: 3 [IU] via SUBCUTANEOUS
  Administered 2018-02-22: 4 [IU] via SUBCUTANEOUS
  Administered 2018-02-22 – 2018-02-23 (×2): 3 [IU] via SUBCUTANEOUS
  Administered 2018-02-23: 4 [IU] via SUBCUTANEOUS
  Administered 2018-02-23: 3 [IU] via SUBCUTANEOUS
  Administered 2018-02-23: 4 [IU] via SUBCUTANEOUS
  Administered 2018-02-23: 3 [IU] via SUBCUTANEOUS
  Administered 2018-02-24 (×3): 4 [IU] via SUBCUTANEOUS
  Administered 2018-02-24 (×3): 3 [IU] via SUBCUTANEOUS
  Administered 2018-02-24 – 2018-02-25 (×2): 4 [IU] via SUBCUTANEOUS
  Administered 2018-02-25 (×2): 3 [IU] via SUBCUTANEOUS
  Administered 2018-02-25: 4 [IU] via SUBCUTANEOUS
  Administered 2018-02-25 – 2018-02-26 (×4): 3 [IU] via SUBCUTANEOUS
  Administered 2018-02-26 (×2): 4 [IU] via SUBCUTANEOUS
  Administered 2018-02-27 (×2): 3 [IU] via SUBCUTANEOUS
  Administered 2018-02-27: 11 [IU] via SUBCUTANEOUS
  Administered 2018-02-27 – 2018-03-01 (×9): 3 [IU] via SUBCUTANEOUS
  Administered 2018-03-01: 4 [IU] via SUBCUTANEOUS
  Administered 2018-03-01: 3 [IU] via SUBCUTANEOUS
  Administered 2018-03-01: 4 [IU] via SUBCUTANEOUS
  Administered 2018-03-01: 3 [IU] via SUBCUTANEOUS
  Administered 2018-03-01: 4 [IU] via SUBCUTANEOUS
  Administered 2018-03-01 – 2018-03-03 (×5): 3 [IU] via SUBCUTANEOUS
  Administered 2018-03-03: 4 [IU] via SUBCUTANEOUS
  Administered 2018-03-03 – 2018-03-05 (×8): 3 [IU] via SUBCUTANEOUS
  Administered 2018-03-06: 4 [IU] via SUBCUTANEOUS
  Administered 2018-03-06: 3 [IU] via SUBCUTANEOUS
  Administered 2018-03-06: 4 [IU] via SUBCUTANEOUS
  Administered 2018-03-06 (×2): 3 [IU] via SUBCUTANEOUS
  Administered 2018-03-07: 4 [IU] via SUBCUTANEOUS
  Administered 2018-03-07: 3 [IU] via SUBCUTANEOUS
  Administered 2018-03-07: 5 [IU] via SUBCUTANEOUS
  Administered 2018-03-07: 3 [IU] via SUBCUTANEOUS
  Administered 2018-03-08: 21:00:00 4 [IU] via SUBCUTANEOUS
  Administered 2018-03-08 – 2018-03-09 (×5): 3 [IU] via SUBCUTANEOUS
  Administered 2018-03-09: 4 [IU] via SUBCUTANEOUS
  Administered 2018-03-09 – 2018-03-11 (×7): 3 [IU] via SUBCUTANEOUS
  Administered 2018-03-11: 4 [IU] via SUBCUTANEOUS
  Administered 2018-03-11 (×2): 3 [IU] via SUBCUTANEOUS
  Administered 2018-03-12: 21:00:00 4 [IU] via SUBCUTANEOUS
  Administered 2018-03-12: 17:00:00 3 [IU] via SUBCUTANEOUS
  Administered 2018-03-12 (×2): 4 [IU] via SUBCUTANEOUS
  Administered 2018-03-12 – 2018-03-13 (×3): 3 [IU] via SUBCUTANEOUS
  Administered 2018-03-13: 4 [IU] via SUBCUTANEOUS
  Administered 2018-03-13 – 2018-03-14 (×4): 3 [IU] via SUBCUTANEOUS
  Administered 2018-03-14: 4 [IU] via SUBCUTANEOUS
  Administered 2018-03-14 – 2018-03-15 (×4): 3 [IU] via SUBCUTANEOUS
  Administered 2018-03-16 (×2): 4 [IU] via SUBCUTANEOUS
  Administered 2018-03-16 – 2018-03-18 (×11): 3 [IU] via SUBCUTANEOUS
  Administered 2018-03-19 (×2): 4 [IU] via SUBCUTANEOUS
  Filled 2018-01-20 (×268): qty 1

## 2018-01-20 MED ORDER — FUROSEMIDE 10 MG/ML IJ SOLN
40.0000 mg | Freq: Once | INTRAMUSCULAR | Status: AC
  Administered 2018-01-20: 40 mg via INTRAVENOUS
  Filled 2018-01-20: qty 4

## 2018-01-20 MED ORDER — FENTANYL CITRATE (PF) 100 MCG/2ML IJ SOLN: 50.0000 ug | Freq: Once | INTRAMUSCULAR | Status: DC

## 2018-01-20 MED ORDER — PROPOFOL 1000 MG/100ML IV EMUL
0.0000 ug/kg/min | INTRAVENOUS | Status: DC
  Administered 2018-01-20 – 2018-01-21 (×6): 35 ug/kg/min via INTRAVENOUS
  Administered 2018-01-21: 40 ug/kg/min via INTRAVENOUS
  Filled 2018-01-20 (×9): qty 100

## 2018-01-20 MED ORDER — BISACODYL 10 MG RE SUPP
10.0000 mg | Freq: Every day | RECTAL | Status: DC | PRN
  Administered 2018-01-20: 10 mg via RECTAL
  Filled 2018-01-20 (×3): qty 1

## 2018-01-20 MED ORDER — DIGOXIN 125 MCG PO TABS
0.1250 mg | ORAL_TABLET | Freq: Every day | ORAL | Status: DC
  Administered 2018-01-20 – 2018-01-21 (×2): 0.125 mg
  Filled 2018-01-20 (×3): qty 1

## 2018-01-20 MED ORDER — METOPROLOL TARTRATE 25 MG PO TABS
12.5000 mg | ORAL_TABLET | Freq: Three times a day (TID) | ORAL | Status: DC
  Administered 2018-01-20 – 2018-01-26 (×16): 12.5 mg
  Administered 2018-01-26: 14:00:00
  Administered 2018-01-26 – 2018-01-29 (×8): 12.5 mg
  Filled 2018-01-20 (×26): qty 1

## 2018-01-20 MED ORDER — FENTANYL BOLUS VIA INFUSION
50.0000 ug | INTRAVENOUS | Status: DC | PRN
  Administered 2018-01-22 (×2): 50 ug via INTRAVENOUS
  Filled 2018-01-20: qty 50

## 2018-01-20 MED ORDER — AMIODARONE HCL 200 MG PO TABS
400.0000 mg | ORAL_TABLET | Freq: Two times a day (BID) | ORAL | Status: DC
  Administered 2018-01-20 – 2018-01-21 (×4): 400 mg
  Filled 2018-01-20 (×4): qty 2

## 2018-01-20 MED ORDER — DEXTROSE 5 % IV SOLN
3.0000 g | Freq: Once | INTRAVENOUS | Status: AC
  Administered 2018-01-21: 3 g via INTRAVENOUS
  Filled 2018-01-20: qty 3

## 2018-01-20 NOTE — Progress Notes (Signed)
Pharmacy Antibiotic Note  Tommy PangJames Mathews is a 57 y.o. male admitted on 01/06/2018 with pneumonia.  Pharmacy has been consulted for cefepime and vancomycin dosing.  Plan: 1. Cefepime 2 gm IV Q8H 2. Vancomycin 1.25 gm IV x 1 followed in approximately 6 hours (stacked dosing) by vancomycin 1.25 gm IV Q8H, predicted trough 15 mcg/mL. Pharmacy will continue to follow and adjust as needed to maintain trough 15 to 20 mcg/mL.    Vd 67.1 L, Ke 0.115 hr-1, T1/2 6 hr  4/21: Renal function about stable from yesterday, pt at risk for accumulation. Need to monitor renal function closely. SCr ordered for AM with level. If first trough higher than expected, would likely need to decrease dose.   Height: 5\' 10"  (177.8 cm) Weight: (!) 301 lb 5.9 oz (136.7 kg) IBW/kg (Calculated) : 73  Temp (24hrs), Avg:98 F (36.7 C), Min:96.8 F (36 C), Max:99.9 F (37.7 C)  Recent Labs  Lab 01/13/18 0754 01/14/18 0355 01/15/18 0412 01/16/18 1030 01/17/18 0425 01/18/18 0526 01/19/18 0347 01/20/18 0109  WBC  --  5.6 5.9  --  5.3 6.6 8.4  --   CREATININE  --  1.10 1.05 0.69 0.72 0.85 0.89 1.05  VANCOTROUGH 14*  --   --   --   --   --   --  26*    Estimated Creatinine Clearance: 110.7 mL/min (by C-G formula based on SCr of 1.05 mg/dL).    Not on File  Antimicrobials this admission:   Dose adjustments this admission: 04/22 0100 vanc level 26. Changed to 1250 mg q 12 hours. Level before 3rd new dose to evaluate.   Microbiology results:  Sputum:  pending  MRSA PCR: +  Thank you for allowing pharmacy to be a part of this patient's care.  Mikela Senn S, Pharm.D., BCPS Clinical Pharmacist 01/20/2018 2:52 AM

## 2018-01-20 NOTE — Consult Note (Signed)
Tommy Mathews , MD [address], [place], [place], [place], [address] [address], [place], [place], [place], [address] Phone: [phone]  Fax: [phone]  Consultation  Referring Provider:  Dr Leonidas Romberg  Primary Care Physician:  Patient, [name] Primary Gastroenterologist: None          Reason for Consultation:     G tube placement   Date of Admission:  01/06/2018 Date of Consultation:  01/20/2018         HPI:   Tommy Mathews is a 57 y.o. male admitted with respiratory failure on 01/06/18 in septic shock, AKI and had coffee ground emesis,ARDS. Long stay in the hospital . Stay complicated by small bowel ileus . ENT consulted for tracheostomy placement. Tracheostomy planned on 01/23/18 . Presently sepsis resolved. On Tube feeds at goal.   Patient is intubated and cannot give any history   Past Medical History:  Diagnosis Date  . Diabetes ([place])   . GERD (gastroesophageal reflux disease)   . HLD (hyperlipidemia)   . HTN (hypertension)       Prior to Admission medications   Medication Sig Start Date End Date Taking? Authorizing Provider  atorvastatin (LIPITOR) 80 MG tablet Take 80 mg by mouth at bedtime.   Yes [provider]  divalproex (DEPAKOTE ER) 500 MG 24 hr tablet Take 500 mg by mouth 2 (two) times daily.   Yes [provider]  estradiol (ESTRACE) 2 MG tablet Take 2 mg by mouth daily.   Yes [provider]  FLUoxetine (PROZAC) 40 MG capsule Take 40 mg by mouth daily.   Yes [provider]  folic acid (FOLVITE) 322 MCG tablet Take 800 mcg by mouth daily.   Yes [provider]  hydrochlorothiazide (HYDRODIURIL) 50 MG tablet Take 50 mg by mouth daily.   Yes [provider]  lisinopril (PRINIVIL,ZESTRIL) 5 MG tablet Take 5 mg by mouth daily.   Yes [provider]  metFORMIN (GLUCOPHAGE) 1000 MG tablet Take 1,000 mg by mouth 2 (two) times daily with a meal.   Yes [provider]  methotrexate  (RHEUMATREX) 2.5 MG tablet Take 15 mg by mouth once a week.   Yes [provider]  metoprolol tartrate (LOPRESSOR) 50 MG tablet Take 50 mg by mouth 2 (two) times daily.   Yes [provider]  Omega-3 Fatty Acids (FISH OIL) 1000 MG CAPS Take 1,000 mg by mouth daily.   Yes [provider]  QUEtiapine Fumarate (SEROQUEL PO) Take 500 mg by mouth 2 (two) times daily.   Yes [provider]  ranitidine (ZANTAC) 150 MG tablet Take 150 mg by mouth daily.   Yes [provider]  rivaroxaban (XARELTO) 10 MG TABS tablet Take 10 mg by mouth daily.   Yes [provider]  senna-docusate (SENOKOT-S) 8.6-50 MG tablet Take 2 tablets by mouth at bedtime.   Yes [provider]  sodium chloride 1 g tablet Take 1 g by mouth daily.   Yes [provider]   Current Facility-Administered Medications  Medication Dose Route Frequency Provider Last Rate Last Dose  . 0.9 %  sodium chloride infusion  250 mL Intravenous PRN Lance Coon, MD 10 mL/hr at 01/12/18 0800    . acetaminophen (TYLENOL) suppository 650 mg  650 mg Rectal Q6H PRN Anders Simmonds, MD   650 mg at 01/08/18 0331  . amiodarone (PACERONE) tablet 400 mg  400 mg Per Tube BID Wilhelmina Mcardle, MD   400  mg at 01/20/18 1042  . bisacodyl (DULCOLAX) suppository 10 mg  10 mg Rectal Daily PRN Wilhelmina Mcardle, MD   10 mg at 01/20/18 1048  . budesonide (PULMICORT) nebulizer solution 0.5 mg  0.5 mg Nebulization BID Flora Lipps, MD   0.5 mg at 01/20/18 0747  . chlorhexidine gluconate (MEDLINE KIT) (PERIDEX) 0.12 % solution 15 mL  15 mL Mouth Rinse BID Flora Lipps, MD   15 mL at 01/20/18 0753  . digoxin (LANOXIN) tablet 0.125 mg  0.125 mg Per Tube Daily Wilhelmina Mcardle, MD   0.125 mg at 01/20/18 1044  . enoxaparin (LOVENOX) injection 40 mg  40 mg Subcutaneous Daily Flora Lipps, MD   40 mg at 01/20/18 1335  . feeding supplement (PRO-STAT SUGAR FREE 64) liquid 60 mL  60 mL Per Tube BID Flora Lipps,  MD   60 mL at 01/20/18 1334  . feeding supplement (VITAL HIGH PROTEIN) liquid 1,000 mL  1,000 mL Per Tube Continuous Flora Lipps, MD 45 mL/hr at 01/20/18 0436    . fentaNYL (SUBLIMAZE) bolus via infusion 50 mcg  50 mcg Intravenous Q1H PRN Wilhelmina Mcardle, MD      . fentaNYL (SUBLIMAZE) injection 50 mcg  50 mcg Intravenous Once Wilhelmina Mcardle, MD      . fentaNYL 2528mg in NS 254m(1062mml) infusion-PREMIX  25-400 mcg/hr Intravenous Continuous SimWilhelmina McardleD      . free water 200 mL  200 mL Per Tube Q4H SimWilhelmina McardleD      . hydrALAZINE (APRESOLINE) injection 10-20 mg  10-20 mg Intravenous Q4H PRN BlaAwilda BillP   20 mg at 01/19/18 0524  . insulin aspart (novoLOG) injection 0-20 Units  0-20 Units Subcutaneous Q4H SimWilhelmina McardleD   11 Units at 01/20/18 1331  . insulin glargine (LANTUS) injection 25 Units  25 Units Subcutaneous QHS Conforti, John, DO   25 Units at 01/19/18 2140  . ipratropium-albuterol (DUONEB) 0.5-2.5 (3) MG/3ML nebulizer solution 3 mL  3 mL Nebulization Q6H SimWilhelmina McardleD   3 mL at 01/20/18 1304  . LORazepam (ATIVAN) injection 1-2 mg  1-2 mg Intravenous Q4H PRN SimWilhelmina McardleD      . MEDLINE mouth rinse  15 mL Mouth Rinse 10 times per day KasFlora LippsD   15 mL at 01/20/18 1230  . metoprolol tartrate (LOPRESSOR) injection 2.5 mg  2.5 mg Intravenous Once BlaAwilda BillP      . metoprolol tartrate (LOPRESSOR) tablet 12.5 mg  12.5 mg Per Tube TID SimWilhelmina McardleD      . midazolam (VERSED) bolus via infusion 2 mg  2 mg Intravenous Q30 min PRN SimWilhelmina McardleD   2 mg at 01/11/18 0306  . multivitamin liquid 15 mL  15 mL Per Tube Daily KasFlora LippsD   15 mL at 01/20/18 1043  . pantoprazole sodium (PROTONIX) 40 mg/20 mL oral suspension 40 mg  40 mg Per Tube Daily Gouru, Aruna, MD   40 mg at 01/20/18 1043  . potassium chloride (KLOR-CON) packet 40 mEq  40 mEq Per Tube BID SaiHenreitta LeberD   40 mEq at 01/20/18 1043  . propofol  (DIPRIVAN) 1000 MG/100ML infusion  0-50 mcg/kg/min Intravenous Continuous SimWilhelmina McardleD 28.7 mL/hr at 01/20/18 1045 35 mcg/kg/min at 01/20/18 1045  . senna-docusate (Senokot-S) tablet 2 tablet  2 tablet Per Tube BID KasFlora LippsD   2 tablet at  01/20/18 1044  . sodium chloride flush (NS) 0.9 % injection 10-40 mL  10-40 mL Intracatheter PRN Salary, Montell D, MD      . Valproate Sodium (DEPAKENE) solution 500 mg  500 mg Per Tube BID Flora Lipps, MD   500 mg at 01/20/18 1110    No family history on file.   Social History   Tobacco Use  . Smoking status: Unknown If Ever Smoked  Substance Use Topics  . Alcohol use: Not on file  . Drug use: Not on file    Allergies as of 01/06/2018  . (Not on File)    Review of Systems:    Patient unable toprovide ROS.   Physical Exam:  Vital signs in last 24 hours: Temp:  [96.1 F (35.6 C)-98.4 F (36.9 C)] 98.4 F (36.9 C) (04/22 1200) Pulse Rate:  [68-224] 95 (04/22 1200) Resp:  [0-32] 8 (04/22 1200) BP: (90-143)/(43-78) 129/65 (04/22 1200) SpO2:  [87 %-100 %] 99 % (04/22 1200) FiO2 (%):  [30 %-60 %] 30 % (04/22 1229) Weight:  [307 lb 8.7 oz (139.5 kg)] 307 lb 8.7 oz (139.5 kg) (04/22 1200) Last BM Date: 01/14/18 General:  Intubated-sedated Head:  Normocephalic and atraumatic. Eyes:   No icterus.   Conjunctiva pink. PERRLA. Ears:  Cannot assess Neck:  Supple; no masses or thyroidomegaly, ET tube in place Lungs: Respirations even and unlabored. Decreased air entery B/l no added sounds  Heart:  Regular rate and rhythm;  Without murmur, clicks, rubs or gallops Abdomen:  Soft, nondistended, nontender.left side of the midline near epigastrium old G tube scar  Normal bowel sounds. No appreciable masses or hepatomegaly.  No rebound or guarding.  Neurologic:  Alert and oriented x0;  Large hydrocele Skin:  Intact without significant lesions or rashes. Cervical Nodes:  No significant cervical adenopathy. Psych:  Cannot assess   LAB  RESULTS: Recent Labs    01/18/18 0526 01/19/18 0347  WBC 6.6 8.4  HGB 9.2* 10.3*  HCT 28.2* 31.3*  PLT 174 187   BMET Recent Labs    01/18/18 0526 01/19/18 0347 01/20/18 0109 01/20/18 0411  NA 149* 146*  --  146*  K 4.3 3.7  --  3.6  CL 112* 109  --  108  CO2 35* 28  --  30  GLUCOSE 161* 294*  --  334*  BUN 32* 37*  --  52*  CREATININE 0.85 0.89 1.05 1.05  CALCIUM 7.7* 7.9*  --  7.5*   LFT Recent Labs    01/20/18 0411  PROT 5.2*  ALBUMIN 1.7*  AST 38  ALT 39  ALKPHOS 125  BILITOT 0.3   PT/INR Recent Labs    01/20/18 0411  LABPROT 13.5  INR 1.04    STUDIES: Dg Chest Port 1 View  Result Date: 01/19/2018 CLINICAL DATA:  Acute respiratory failure. EXAM: PORTABLE CHEST 1 VIEW COMPARISON:  01/18/2018 FINDINGS: Endotracheal tube terminates 4 cm above the carina. Enteric tube can be followed to the GE junction but is not well visualized beyond this due to under penetration in the upper abdomen. Right PICC terminates at the cavoatrial junction. The cardiomediastinal silhouette is unchanged allowing for differences in patient rotation. Interstitial and patchy airspace opacities are again seen bilaterally with mild improvement in the right base and without significant interval change on the left. No large pleural effusion or pneumothorax is identified. IMPRESSION: Bilateral interstitial and airspace opacities, slightly improved on the right and likely reflecting pneumonia. Electronically Signed   By: Zenia Resides  Jeralyn Ruths M.D.   On: 01/19/2018 07:49   Dg Chest Port 1 View  Result Date: 01/18/2018 CLINICAL DATA:  Oxygen desaturation EXAM: PORTABLE CHEST 1 VIEW COMPARISON:  01/18/2018 FINDINGS: Endotracheal tube, right PICC line and NG tube remain in place, unchanged. Bilateral interstitial and alveolar opacities are again noted, unchanged. Small bilateral effusions suspected. Heart is upper limits normal in size. IMPRESSION: Bilateral interstitial and airspace opacities with small  effusions. No real change. Electronically Signed   By: Rolm Baptise M.D.   On: 01/18/2018 16:39      Impression / Plan:   Tommy Mathews is a 57 y.o. y/o male with admitted a few weeks back with sepsis , respiratory failure, ARDS. Now consulted for a G tube placement . Tracheostomy planned on 01/23/18 .   Plan   Discussed with family , will plan for G tube placement tomorrow. Discussed risks and possible complications  NPO from midnight      Thank you for involving me in the care of this patient.      LOS: 14 days   Tommy Bellows, MD  01/20/2018, 3:08 PM

## 2018-01-20 NOTE — Progress Notes (Signed)
Normal day. Dr. Tobi BastosAnna in to explain PEG placement. Scheduled for tomorrow morning. Dr.Simonds discussed tracheostomy placement for Thursday per Dr. Andee PolesVaught. No one from ENT group by to talk with family. Remains in Sinus Rhythm rate in 90's. Lasix 40 mg.x 2 to decrease edema. Remains sedated. Family in and out

## 2018-01-20 NOTE — Progress Notes (Signed)
[employer] - [place] at [facility]   PATIENT NAME: Tommy Mathews    MR#:  [id number]  DATE OF BIRTH:  31-Aug-1961  SUBJECTIVE:   Patient remains intubated, sedated.  No acute events overnight.  Plan for Trach on Thursday.    REVIEW OF SYSTEMS:    Review of Systems  Unable to perform ROS: Intubated    Nutrition: Tube feeds Tolerating Diet: Yes Tolerating PT: sedated & intubated.    DRUG ALLERGIES:  Not on File  VITALS:  Blood pressure 118/62, pulse (!) 111, temperature (!) 97.3 F (36.3 C), resp. rate (!) 0, height 5\' 10"  (1.778 m), weight (!) 136.7 kg (301 lb 5.9 oz), SpO2 94 %.  PHYSICAL EXAMINATION:   Physical Exam  GENERAL:  57 y.o.-year-old critically ill patient lying in bed intubated & sedated EYES: Pupils equal, round, reactive to light. No scleral icterus.  HEENT: Head atraumatic, normocephalic. ET and OG tubes in place.  NECK:  Supple, no jugular venous distention. No thyroid enlargement, no tenderness.  LUNGS: Poor Resp. effort, no wheezing, rales, rhonchi. No use of accessory muscles of respiration.  CARDIOVASCULAR: S1, S2 normal. No murmurs, rubs, or gallops.  ABDOMEN: Soft, nontender, nondistended. Bowel sounds present. No organomegaly or mass.  EXTREMITIES: No cyanosis, clubbing or edema b/l.    NEUROLOGIC: Sedated & intubated  PSYCHIATRIC: Sedated & intubated.   SKIN: No obvious rash, lesion, or ulcer.    LABORATORY PANEL:   CBC Recent Labs  Lab 01/19/18 0347  WBC 8.4  HGB 10.3*  HCT 31.3*  PLT 187   ------------------------------------------------------------------------------------------------------------------  Chemistries  Recent Labs  Lab 01/20/18 0411  NA 146*  K 3.6  CL 108  CO2 30  GLUCOSE 334*  BUN 52*  CREATININE 1.05  CALCIUM 7.5*  MG 1.7  AST 38  ALT 39  ALKPHOS 125  BILITOT 0.3    ------------------------------------------------------------------------------------------------------------------  Cardiac Enzymes No results for input(s): TROPONINI in the last 168 hours. ------------------------------------------------------------------------------------------------------------------  RADIOLOGY:  Dg Chest Port 1 View  Result Date: 01/19/2018 CLINICAL DATA:  Acute respiratory failure. EXAM: PORTABLE CHEST 1 VIEW COMPARISON:  01/18/2018 FINDINGS: Endotracheal tube terminates 4 cm above the carina. Enteric tube can be followed to the GE junction but is not well visualized beyond this due to under penetration in the upper abdomen. Right PICC terminates at the cavoatrial junction. The cardiomediastinal silhouette is unchanged allowing for differences in patient rotation. Interstitial and patchy airspace opacities are again seen bilaterally with mild improvement in the right base and without significant interval change on the left. No large pleural effusion or pneumothorax is identified. IMPRESSION: Bilateral interstitial and airspace opacities, slightly improved on the right and likely reflecting pneumonia. Electronically Signed   By: Sebastian AcheAllen  Grady M.D.   On: 01/19/2018 07:49   Dg Chest Port 1 View  Result Date: 01/18/2018 CLINICAL DATA:  Oxygen desaturation EXAM: PORTABLE CHEST 1 VIEW COMPARISON:  01/18/2018 FINDINGS: Endotracheal tube, right PICC line and NG tube remain in place, unchanged. Bilateral interstitial and alveolar opacities are again noted, unchanged. Small bilateral effusions suspected. Heart is upper limits normal in size. IMPRESSION: Bilateral interstitial and airspace opacities with small effusions. No real change. Electronically Signed   By: Charlett NoseKevin  Dover M.D.   On: 01/18/2018 16:39     ASSESSMENT AND PLAN:   57 yo hx of HTN, Hyperlipidemia, GERD, Diabetes, who presented to the hospital for shortness of breath and noted to be in septic shock secondary to  pneumonia.  1. acute severe  septic shock -Patient is now off vasopressors.  Patient has finished treatment of underlying pneumonia UTI with IV antibiotics.  2. acutepneumonia/UTI -Patient has finished treatment with IV antibiotics for the pneumonia and UTI.  3.GI bleed  - Hg. Stable.   - Continue to hold Xarelto,continuePPI  4. acute respiratory failure with hypoxia-this was secondary to pneumonia.  Patient was difficult to wean off the ventilator. -Seen by ENT and plan for tracheostomy on Thursday.   -Continue supportive care as per pulmonary and aggressive pulmonary toileting for now.  Continue Pulmicort nebs, duo nebs.  5.  Atrial fibrillation with rapid ventricular response-patient has now converted to a sinus rhythm with the amiodarone drip. - off Amio gtt. Cont. Oral Amio, Digoxin.   6.AKI-resolved and creatinine at baseline.  7. chronic diabetes mellitus type 2 - cont. SSI and follow BS  8. Depression - cont. Prozac/Depakote.   ?? Placement to LTAC after Trach placed on Monday.   All the records are reviewed and case discussed with Care Management/Social Worker. Management plans discussed with the patient, family and they are in agreement.  CODE STATUS: Full code  DVT Prophylaxis: Lovenox  TOTAL TIME TAKING CARE OF THIS PATIENT: 25 minutes.   POSSIBLE D/C unclear DEPENDING ON CLINICAL CONDITION.   Houston Siren M.D on 01/20/2018 at 2:08 PM  Between 7am to 6pm - Pager - [phone]  After 6pm go to [email or web address] - Social research officer, government  [facility] [place] [facility]  Office  [phone]  CC: Primary care physician; Patient, [name]

## 2018-01-20 NOTE — Progress Notes (Addendum)
PULMONARY / CRITICAL CARE MEDICINE   Name: Tommy Mathews MRN: [id number] DOB: 04/06/1961    ADMISSION DATE:  01/06/2018   PT PROFILE:  56 M SNF resident due to psychiatric illness and history of TBI admitted with acute hypoxemic respiratory failure, severe bilateral pulmonary infiltrates, severe sepsis/septic shock.  Intubated in the ED shortly after arrival.  Was also noted to have dark coffee-ground material suctioned from stomach.  Patient chronically on rivaroxaban.   MAJOR EVENTS/TEST RESULTS: 04/08 Admission as above 04/08 CT chest: Multifocal severe pneumonia with RUL collapse and soft tissue obstructing RUL bronchus.  04/08 CTAP: Mild small bowel ileus, potential enteritis. Nasogastric tube terminates in distal stomach. Cholelithiasis without CT findings of acute cholecystitis.Consider bronchoscopy.  04/09 nephrology consultation: No acute indication for dialysis at present. 04/09 gastroenterology consultation: Recommend high-dose PPI therapy. 04/10 severely hypoxemic despite 100% FiO2.  Recruitment maneuver with significant improvement.  PEEP increased and ARDS strategy on ventilator implemented.  Neuromuscular blockade implemented due to ventilator dyssynchrony. 04/11 PAF with RVR > converted with amiodarone. Gas exchange improving. Off Nimbex 04/12 Echocardiogram: EF 20-25%.  Diffuse HK. 04/13 High airway pressure with vent dyssynchony; given Vecuronium; FIO2 increased to 50%  04/16 Cardiology consultation 04/13 Amiodarone D/C because of SB 04/15 increased HR, EF 20% afib with RVR 04/17 failed weaning trials, HR remains elevated 04/19 ENT consultation for trach tube placement 04/20 no significant changes in the last 24 hours. Has not tolerated any weaning   INDWELLING DEVICES:: ETT 04/08 >>  L femoral CVL 04/08 >> 4/16 RUE PICC 4/15 >>   MICRO DATA: MRSA PCR 04/08, 04/20 >> POS Urine 04/08 >> 10k coag negative staph Resp 04/09 >> c/w NOF Blood 04/08 >> NEG Blood 04/09 >>  NEG Resp 04/20 >>   ANTIMICROBIALS:  Unasyn 04/08 >> 04/10 Vanc 04/08 >> 4/15 Cefepime 04/10 >> 4/15 Metronidazole 04/10 >> 4/15   SUBJECTIVE:  Remains intubated.  Requiring high sedation levels to maintain ventilator synchrony.  Not following commands.    VITAL SIGNS: BP 129/65   Pulse 95   Temp 98.4 F (36.9 C) (Oral)   Resp (!) 8   Ht 5\' 10"  (1.778 m)   Wt (!) 307 lb 8.7 oz (139.5 kg)   SpO2 99%   BMI 44.13 kg/m    VENTILATOR SETTINGS: Vent Mode: PCV FiO2 (%):  [30 %-60 %] 30 % Set Rate:  [20 bmp-30 bmp] 20 bmp PEEP:  [5 cmH20-6 cmH20] 5 cmH20 Plateau Pressure:  [22 cmH20-27 cmH20] 24 cmH20  INTAKE / OUTPUT: I/O last 3 completed shifts: In: 5119.2 [I.V.:1662.2; NG/GT:1957; IV Piggyback:1500] Out: 5840 [Urine:5840]  PHYSICAL EXAMINATION: General: RASS - 3, negative for. Sedated, synchronous with ventilator Neuro: PERRLA, EOMI, DTRs symmetric, moves all extremities spontaneously HEENT: PERRL, sclerae white Cardiovascular: Tachy, reg, no M Lungs: bilateral rhonchi anteriorly, no wheezes Abdomen: Mildly distended, bowel sounds present Extremities: Warm, anasarca Skin: Sacral decubitus pressure ulcer  LABS:  BMET Recent Labs  Lab 01/18/18 0526 01/19/18 0347 01/20/18 0109 01/20/18 0411  NA 149* 146*  --  146*  K 4.3 3.7  --  3.6  CL 112* 109  --  108  CO2 35* 28  --  30  BUN 32* 37*  --  52*  CREATININE 0.85 0.89 1.05 1.05  GLUCOSE 161* 294*  --  334*    Electrolytes Recent Labs  Lab 01/17/18 0425 01/18/18 0526 01/19/18 0347 01/20/18 0411  CALCIUM 6.9* 7.7* 7.9* 7.5*  MG 1.6* 1.8  --  1.7  PHOS  2.8 3.3  --  3.6    CBC Recent Labs  Lab 01/17/18 0425 01/18/18 0526 01/19/18 0347  WBC 5.3 6.6 8.4  HGB 9.1* 9.2* 10.3*  HCT 27.9* 28.2* 31.3*  PLT 172 174 187    Coag's Recent Labs  Lab 01/20/18 0411  INR 1.04    Sepsis Markers Recent Labs  Lab 01/18/18 0526 01/19/18 0347 01/20/18 0411  PROCALCITON 0.27 0.22 0.43     ABG Recent Labs  Lab 01/17/18 0501 01/18/18 0626 01/18/18 1751  PHART 7.41 7.49* 7.43  PCO2ART 59* 48 55*  PO2ART 150* 71* 132*    Liver Enzymes Recent Labs  Lab 01/16/18 1030 01/18/18 0526 01/20/18 0411  AST  --  22 38  ALT  --  20 39  ALKPHOS  --  74 125  BILITOT  --  0.3 0.3  ALBUMIN 1.4* 1.7* 1.7*    Cardiac Enzymes No results for input(s): TROPONINI, PROBNP in the last 168 hours.  Glucose Recent Labs  Lab 01/19/18 1131 01/19/18 1600 01/19/18 1952 01/19/18 2354 01/20/18 0356 01/20/18 0716  GLUCAP 287* 305* 258* 276* 307* 268*    CXR 4/21: Edema versus ARDS pattern    ASSESSMENT / PLAN: PULMONARY A: Acute hypoxemic respiratory failure Aspiration pneumonia ARDS  Pulmonary edema Prolonged mechanical ventilation P:   Cont full vent support - settings reviewed and/or adjusted Cont vent bundle Daily SBT if/when meets criteria  PCV mode to improve ventilator synchrony Tracheostomy tube planned 04/25  CARDIOVASCULAR A: septic shock, resolved Paroxysmal atrial fibrillation Dilated cardiomyopathy, etiology unclear P:  MAP goal > 65 mmHg Continue amiodarone (enteral load) At some point, will require further cardiac evaluation of cardiomyopathy  RENAL A:   AKI, resolved Hypernatremia Severe hypervolemia P: Monitor BMET intermittently Monitor I/Os Correct electrolytes as indicated  Free water repletion Diuresis ordered 04/22  GASTROINTESTINAL A:   Abdominal distention, resolved Suspected UGIB, resolved P:   SUP: Enteral PPI Continue TF protocol at goal GI consultation requested for G-tube placement  HEMATOLOGIC A: ICU acquired anemia No evidence of acute blood loss P: DVT px: enoxaparin Monitor CBC intermittently Transfuse per usual guidelines   INFECTIOUS A:   Severe sepsis with shock, resolved Aspiration pneumonia, treated P:   Monitor temp, WBC count Micro and abx as above   ENDOCRINE A:   Type 2 diabetes,  poorly controlled P:   Continue Lantus (initiated 04/21) Continue resistance scale SSI  NEUROLOGIC A:   Ventilator dyssynchrony  ICU/ventilator associated discomfort History of schizophrenia History of TBI P:   RASS goal: -2, -3 Continue fentanyl and propofol  Family updated in detail at bedside  CCM time: 40 minutes The above time includes time spent in consultation with patient and/or family members and reviewing care plan on multidisciplinary rounds  Billy Fischeravid Simonds, MD PCCM service Mobile [phone] Pager [phone] 01/20/2018 2:58 PM

## 2018-01-21 ENCOUNTER — Inpatient Hospital Stay: Payer: Medicaid Other

## 2018-01-21 ENCOUNTER — Encounter
Admission: EM | Disposition: A | Discharge status: Skilled Nursing Facility | Payer: Self-pay | Source: Home / Self Care | Attending: Internal Medicine

## 2018-01-21 ENCOUNTER — Encounter: Payer: Self-pay | Admitting: Anesthesiology

## 2018-01-21 DIAGNOSIS — R601 Generalized edema: Secondary | ICD-10-CM

## 2018-01-21 HISTORY — PX: PEG PLACEMENT: SHX5437

## 2018-01-21 LAB — CBC
HCT: 23.8 % — ABNORMAL LOW (ref 40.0–52.0)
Hemoglobin: 8.3 g/dL — ABNORMAL LOW (ref 13.0–18.0)
MCH: 29.9 pg (ref 26.0–34.0)
MCHC: 34.8 g/dL (ref 32.0–36.0)
MCV: 86 fL (ref 80.0–100.0)
PLATELETS: 155 10*3/uL (ref 150–440)
RBC: 2.77 MIL/uL — ABNORMAL LOW (ref 4.40–5.90)
RDW: 18.9 % — ABNORMAL HIGH (ref 11.5–14.5)
WBC: 13.5 10*3/uL — ABNORMAL HIGH (ref 3.8–10.6)

## 2018-01-21 LAB — COMPREHENSIVE METABOLIC PANEL
ALBUMIN: 1.7 g/dL — AB (ref 3.5–5.0)
ALT: 30 U/L (ref 17–63)
ANION GAP: 4 — AB (ref 5–15)
AST: 30 U/L (ref 15–41)
Alkaline Phosphatase: 96 U/L (ref 38–126)
BUN: 51 mg/dL — ABNORMAL HIGH (ref 6–20)
CHLORIDE: 110 mmol/L (ref 101–111)
CO2: 33 mmol/L — AB (ref 22–32)
Calcium: 7.4 mg/dL — ABNORMAL LOW (ref 8.9–10.3)
Creatinine, Ser: 1.04 mg/dL (ref 0.61–1.24)
GFR calc non Af Amer: >60 mL/min (ref >60)
Glucose, Bld: 193 mg/dL — ABNORMAL HIGH (ref 65–99)
Potassium: 3.3 mmol/L — ABNORMAL LOW (ref 3.5–5.1)
SODIUM: 147 mmol/L — AB (ref 135–145)
Total Bilirubin: 0.5 mg/dL (ref 0.3–1.2)
Total Protein: 4.7 g/dL — ABNORMAL LOW (ref 6.5–8.1)

## 2018-01-21 LAB — CULTURE, RESPIRATORY W GRAM STAIN

## 2018-01-21 LAB — GLUCOSE, CAPILLARY
GLUCOSE-CAPILLARY: 161 mg/dL — AB (ref 65–99)
GLUCOSE-CAPILLARY: 119 mg/dL — AB (ref 65–99)
GLUCOSE-CAPILLARY: 148 mg/dL — AB (ref 65–99)
Glucose-Capillary: 239 mg/dL — ABNORMAL HIGH (ref 65–99)
Glucose-Capillary: 170 mg/dL — ABNORMAL HIGH (ref 65–99)
Glucose-Capillary: 157 mg/dL — ABNORMAL HIGH (ref 65–99)

## 2018-01-21 LAB — CULTURE, RESPIRATORY: CULTURE: NORMAL

## 2018-01-21 SURGERY — INSERTION, PEG TUBE
Anesthesia: General

## 2018-01-21 MED ORDER — POTASSIUM CHLORIDE 20 MEQ PO PACK
40.0000 meq | PACK | Freq: Three times a day (TID) | ORAL | Status: AC
  Administered 2018-01-21 (×3): 40 meq
  Filled 2018-01-21 (×3): qty 2

## 2018-01-21 MED ORDER — LORAZEPAM 2 MG/ML IJ SOLN
1.0000 mg | INTRAMUSCULAR | Status: DC | PRN
  Administered 2018-01-21 – 2018-01-22 (×4): 2 mg via INTRAVENOUS
  Filled 2018-01-21 (×3): qty 1

## 2018-01-21 MED ORDER — IPRATROPIUM-ALBUTEROL 0.5-2.5 (3) MG/3ML IN SOLN
3.0000 mL | RESPIRATORY_TRACT | Status: DC | PRN
  Administered 2018-01-21: 3 mL via RESPIRATORY_TRACT
  Filled 2018-01-21: qty 3

## 2018-01-21 MED ORDER — FUROSEMIDE 10 MG/ML IJ SOLN
40.0000 mg | Freq: Once | INTRAMUSCULAR | Status: AC
  Administered 2018-01-21: 40 mg via INTRAVENOUS
  Filled 2018-01-21: qty 4

## 2018-01-21 MED ORDER — VITAL HIGH PROTEIN PO LIQD
1000.0000 mL | ORAL | Status: DC
  Administered 2018-01-22: 1000 mL

## 2018-01-21 MED ORDER — POTASSIUM CHLORIDE 2 MEQ/ML IV SOLN
INTRAVENOUS | Status: DC
Start: 2018-01-21 — End: 2018-01-23
  Administered 2018-01-21 – 2018-01-23 (×5): via INTRAVENOUS
  Filled 2018-01-21 (×7): qty 1000

## 2018-01-21 SURGICAL SUPPLY — 1 items: Percutaneous Gastrostomy Tube ×3 IMPLANT

## 2018-01-21 NOTE — H&P (Signed)
Tommy Tommy Twilia Yaklin, MD [address], [address], [place], [place], [address] [address], [address], [place], [place], [address] Phone: [phone]  Fax: [phone]  Primary Care Physician:  Patient, [name]   Pre-Procedure History & Physical: HPI:  Tommy PangJames Mathews is a 57 y.o. male is here for an endoscopy    Past Medical History:  Diagnosis Date  . Diabetes (HCC)   . GERD (gastroesophageal reflux disease)   . HLD (hyperlipidemia)   . HTN (hypertension)     History reviewed. No pertinent surgical history.  Prior to Admission medications   Medication Sig Start Date End Date Taking? Authorizing Provider  atorvastatin (LIPITOR) 80 MG tablet Take 80 mg by mouth at bedtime.   Yes [provider]  divalproex (DEPAKOTE ER) 500 MG 24 hr tablet Take 500 mg by mouth 2 (two) times daily.   Yes [provider]  estradiol (ESTRACE) 2 MG tablet Take 2 mg by mouth daily.   Yes [provider]  FLUoxetine (PROZAC) 40 MG capsule Take 40 mg by mouth daily.   Yes [provider]  folic acid (FOLVITE) 800 MCG tablet Take 800 mcg by mouth daily.   Yes [provider]  hydrochlorothiazide (HYDRODIURIL) 50 MG tablet Take 50 mg by mouth daily.   Yes [provider]  lisinopril (PRINIVIL,ZESTRIL) 5 MG tablet Take 5 mg by mouth daily.   Yes [provider]  metFORMIN (GLUCOPHAGE) 1000 MG tablet Take 1,000 mg by mouth 2 (two) times daily with a meal.   Yes [provider]  methotrexate (RHEUMATREX) 2.5 MG tablet Take 15 mg by mouth once a week.   Yes [provider]  metoprolol tartrate (LOPRESSOR) 50 MG tablet Take 50 mg by mouth 2 (two) times daily.   Yes [provider]  Omega-3 Fatty Acids (FISH OIL) 1000 MG CAPS Take 1,000 mg by mouth daily.   Yes [provider]  QUEtiapine Fumarate (SEROQUEL PO) Take 500 mg by mouth 2 (two) times daily.   Yes [provider]  ranitidine (ZANTAC) 150 MG  tablet Take 150 mg by mouth daily.   Yes [provider]  rivaroxaban (XARELTO) 10 MG TABS tablet Take 10 mg by mouth daily.   Yes [provider]  senna-docusate (SENOKOT-S) 8.6-50 MG tablet Take 2 tablets by mouth at bedtime.   Yes [provider]  sodium chloride 1 g tablet Take 1 g by mouth daily.   Yes [provider]    Allergies as of 01/06/2018  . (Not on File)    History reviewed. No pertinent family history.  Social History   Socioeconomic History  . Marital status: Unknown    Spouse name: Not on file  . Number of children: Not on file  . Years of education: Not on file  . Highest education level: Not on file  Occupational History  . Not on file  Social Needs  . Financial resource strain: Not on file  . Food insecurity:    Worry: Not on file    Inability: Not on file  . Transportation needs:    Medical: Not on file    Non-medical: Not on file  Tobacco Use  . Smoking status: Unknown If Ever Smoked  Substance and Sexual Activity  . Alcohol use: Not on file  . Drug use: Not on file  . Sexual activity: Not on file  Lifestyle  . Physical activity:    Days per week: Not on  file    Minutes per session: Not on file  . Stress: Not on file  Relationships  . Social connections:    Talks on phone: Not on file    Gets together: Not on file    Attends religious service: Not on file    Active member of club or organization: Not on file    Attends meetings of clubs or organizations: Not on file    Relationship status: Not on file  . Intimate partner violence:    Fear of current or ex partner: Not on file    Emotionally abused: Not on file    Physically abused: Not on file    Forced sexual activity: Not on file  Other Topics Concern  . Not on file  Social History Narrative  . Not on file    Review of Systems: See HPI, otherwise negative ROS  Physical Exam: BP (!) 100/50   Pulse 75   Temp (!) 95.7 F (35.4 C)   Resp (!) 0    Ht 5\' 10"  (1.778 m)   Wt (!) 307 lb 8.7 oz (139.5 kg)   SpO2 92%   BMI 44.13 kg/m  General:  Intubated and sedated Head:  Normocephalic and atraumatic. Neck:  Supple; no masses or thyromegaly. Lungs:  Clear throughout to auscultation, normal respiratory effort.    Heart:  +S1, +S2, Regular rate and rhythm, No edema. Abdomen:  Soft,old G tube scar seen ,nontender and nondistended. Normal bowel sounds, without guarding, and without rebound.   Neurologic:  Alert and  oriented x0  Impression/Plan: Tommy Mathews is here for an endoscopy  to be performed for  G tube placement     Risks, benefits, limitations, and alternatives regarding endoscopy have been reviewed with the patient.  Questions have been answered.  All parties agreeable.   Tommy Mood, MD  01/21/2018, 10:16 AM

## 2018-01-21 NOTE — Progress Notes (Signed)
Nutrition Follow-up  DOCUMENTATION CODES:   Obesity unspecified  INTERVENTION:  Tomorrow recommend initiating Vital High Protein at 30 mL/hr (720 mL goal daily volume) + Pro-Stat 60 mL BID per PEG tube. Provides 1120 kcal, 123 grams of protein, 605 mL H2O. With current rates of propofol and D5W patient would receive a total of 2394 kcal daily.  Continue liquid MVI daily per tube.  With current free water flush of 200 mL Q8hrs patient is receiving a total of 1205 mL H2O daily including water in tube feeding.  NUTRITION DIAGNOSIS:   Inadequate oral intake related to inability to eat as evidenced by NPO status.  Ongoing.  GOAL:   Provide needs based on ASPEN/SCCM guidelines  Progressing with re-initiation of tube feeds tomorrow.  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   -Patient s/p placement of PEG tube today. Per GI note can use today for medications and water and may use tomorrow for tube feeding. -Tracheostomy tube planned for 4/25.  Patient intubated and sedated. OGT was removed prior to PEG tube placement today. Patient has now been here 15 days so more appropriate to estimate needs [facility] now.  Access: 20 Fr G-tube with C-clamp placed 4/23; placement verified by relook endoscopy; 4.5 cm at external bumper  MAP: 64-73 mmHg  TF: pt was previously tolerating Vital High protein at 45 mL/hr; tube feeds have been on hold since Midnight for PEG tube placement today  Patient is currently  intubated on ventilator support Ve: 9.4 L/min Temp (24hrs), Avg:96.5 F (35.8 C), Min:95.5 F (35.3 C), Max:97.3 F (36.3 C)  Propofol: 32.8 ml/hr (866 kcal daily)  Medications reviewed and include: amiodarone, free water flush 200 mL Q8hrs, Novolog 0-20 units Q4hrs, Lantus 25 units daily, liquid MVI daily per tube, pantoprazole, potassium chloride 40 mEq TID, senna-docusate, D5W with potassium chloride 40 mEq at 100 mL/hr (120 grams dextrose, 408 kcal daily), fentanyl gtt, propofol gtt.  Labs reviewed: CBG 161-263 past 24 hrs, Sodium 147, Potassium 3.3, CO2 33, BUN 51, Anion gap 4.  I/O: 3275 mL UOP yesterday (1 mL/kg/hr)  Weight trend: 139.5 kg on 4/22; +32.1 kg from admission  Discussed with RN and on rounds.  Diet Order:  Diet NPO time specified  EDUCATION NEEDS:   Not appropriate for education at this time  Skin:  Skin Assessment: Skin Integrity Issues: Skin Integrity Issues:: Stage II, Other (Comment) Stage II: coccyx Other: open blister right thigh; MSAD to bilateral thighs  Last BM:  01/14/2018 - no BM characteristics documented  Height:   Ht Readings from Last 1 Encounters:  01/07/18 5\' 10"  (1.778 m)    Weight:   Wt Readings from Last 1 Encounters:  01/20/18 (!) 307 lb 8.7 oz (139.5 kg)    Ideal Body Weight:  75.5 kg  BMI:  Body mass index is 44.13 kg/m.  Estimated Nutritional Needs:   Kcal:  2084 (PSU 2003b w/ MSJ 1919, Ve 9.4, Tmax 36.9)  Protein:  113-135 grams (1.5-1.8 grams/kg IBW)  Fluid:  1.8 L/day  Helane RimaLeanne Xuan Mateus, MS, RD,  LDN Office: [phone] Pager: [phone] After Hours/Weekend Pager: [phone]

## 2018-01-21 NOTE — OR Nursing (Signed)
Setting up for PEG procedure in ICU bed 5.

## 2018-01-21 NOTE — Op Note (Signed)
[facility] Gastroenterology Patient Name: Tommy Mathews Procedure Date: 01/21/2018 10:14 AM MRN: [id number] Account #: [id number] Date of Birth: 09-04-1961 Admit Type: Inpatient Age: 57 Room: [facility] ENDO ROOM 1 Gender: Male Note Status: Finalized Procedure:            Upper GI endoscopy Indications:          Place PEG due to dysphagia Providers:            Wyline Mood MD, MD Medicines:            Monitored Anesthesia Care Complications:        No immediate complications. Procedure:            Pre-Anesthesia Assessment:                       - Prior to the procedure, a History and Physical was                        performed, and patient medications, allergies and                        sensitivities were reviewed. The patient's tolerance of                        previous anesthesia was reviewed.                       - The risks and benefits of the procedure and the                        sedation options and risks were discussed with the                        patient. All questions were answered and informed                        consent was obtained.                       - ASA Grade Assessment: IV - A patient with severe                        systemic disease that is a constant threat to life.                       After obtaining informed consent, the endoscope was                        passed under direct vision. Throughout the procedure,                        the patient's blood pressure, pulse, and oxygen                        saturations were monitored continuously. The Endoscope                        was introduced through the mouth, and advanced to the                        third part  of duodenum. The upper GI endoscopy was                        accomplished with ease. The patient tolerated the                        procedure well. Findings:      The examined duodenum was normal.      The esophagus was normal.      The stomach was normal.       The patient was placed in the supine position for PEG placement. The       stomach was insufflated to appose gastric and abdominal walls. A site       was located with excellent transillumination and manual external       pressure for placement. The abdominal wall was marked and prepped in a       sterile manner. The area was anesthetized with 4 mL of 0.5% lidocaine.       The trocar needle was introduced through the abdominal wall and into the       stomach under direct endoscopic view. A snare was introduced through the       endoscope and opened in the gastric lumen. The guide wire was passed       through the trocar and into the open snare. The snare was closed around       the guide wire. The endoscope and snare were removed, pulling the wire       out through the mouth. A skin incision was made at the site of needle       insertion. The externally removable 20 Fr Bard gastrostomy tube was       lubricated. The G-tube was tied to the guide wire and pulled through the       mouth and into the stomach. The trocar needle was removed, and the       gastrostomy tube was pulled out from the stomach through the skin. The       external bumper was attached to the gastrostomy tube, and the tube was       cut to remove the guide wire. The final position of the gastrostomy tube       was confirmed by relook endoscopy, and skin marking noted to be 4.5 cm       at the external bumper. The final tension and compression of the       abdominal wall by the PEG tube and external bumper were checked and       revealed that the bumper was loose and lightly touching the skin. The       feeding tube was capped, and the tube site cleaned and dressed. Impression:           - Normal examined duodenum.                       - Normal esophagus.                       - Normal stomach.                       - No specimens collected. Recommendation:       - Please follow the post-PEG recommendations including:  change dressing on top of bumper daily, may use PEG                        today for meds and water, may use PEG tomorrow for                        feedings, flush PEG daily with 60 ml water, check site                        for bleeding q 4 hrs and clean site with soap and water                        daily and dry thoroughly.                       - I will come and loosen bumper tomorrow Procedure Code(s):    --- Professional ---                       [phone], Esophagogastroduodenoscopy, flexible, transoral;                        with directed placement of percutaneous gastrostomy tube Diagnosis Code(s):    --- Professional ---                       R13.10, Dysphagia, unspecified                       Z43.1, Encounter for attention to gastrostomy CPT copyright 2017 [employer]. All rights reserved. The codes documented in this report are preliminary and upon coder review may  be revised to meet current compliance requirements. Wyline Mood, MD Wyline Mood MD, MD 01/21/2018 10:44:04 AM This report has been signed electronically. Number of Addenda: 0 Note Initiated On: 01/21/2018 10:14 AM      [facility]

## 2018-01-21 NOTE — Progress Notes (Signed)
[facility] [place] at [facility]   PATIENT NAME: Tommy Mathews    MR#:  [id number]  DATE OF BIRTH:  Apr 30, 1961  SUBJECTIVE:   Patient remains intubated, sedated.  No acute events overnight.  Plan for Trach on Thursday.  Status post PEG placement today.  REVIEW OF SYSTEMS:    Review of Systems  Unable to perform ROS: Intubated    Nutrition: Tube feeds Tolerating Diet: Yes Tolerating PT: sedated & intubated.    DRUG ALLERGIES:  Not on File  VITALS:  Blood pressure (!) 157/67, pulse 94, temperature (!) 95.7 F (35.4 C), resp. rate (!) 0, height 5\' 10"  (1.778 m), weight (!) 139.5 kg (307 lb 8.7 oz), SpO2 93 %.  PHYSICAL EXAMINATION:   Physical Exam  GENERAL:  57 y.o.-year-old critically ill patient lying in bed intubated & sedated EYES: Pupils equal, round, reactive to light. No scleral icterus.  HEENT: Head atraumatic, normocephalic. ET and OG tubes in place.  NECK:  Supple, no jugular venous distention. No thyroid enlargement, no tenderness.  LUNGS: Poor Resp. effort, no wheezing, rales, rhonchi. No use of accessory muscles of respiration.  CARDIOVASCULAR: S1, S2 normal. No murmurs, rubs, or gallops.  ABDOMEN: Soft, nontender, nondistended. Bowel sounds present. No organomegaly or mass.  PEG tube in place with no acute bleeding around dressing. EXTREMITIES: No cyanosis, clubbing or edema b/l.    NEUROLOGIC: Sedated & intubated  PSYCHIATRIC: Sedated & intubated.   SKIN: No obvious rash, lesion, or ulcer.    LABORATORY PANEL:   CBC Recent Labs  Lab 01/21/18 0500  WBC 13.5*  HGB 8.3*  HCT 23.8*  PLT 155   ------------------------------------------------------------------------------------------------------------------  Chemistries  Recent Labs  Lab 01/20/18 0411 01/21/18 0500  NA 146* 147*  K 3.6 3.3*  CL 108 110  CO2 30 33*  GLUCOSE 334* 193*  BUN 52* 51*  CREATININE 1.05 1.04  CALCIUM 7.5* 7.4*  MG 1.7  --   AST 38 30  ALT 39  30  ALKPHOS 125 96  BILITOT 0.3 0.5   ------------------------------------------------------------------------------------------------------------------  Cardiac Enzymes No results for input(s): TROPONINI in the last 168 hours. ------------------------------------------------------------------------------------------------------------------  RADIOLOGY:  Dg Chest Port 1 View  Result Date: 01/21/2018 CLINICAL DATA:  Hypoxia EXAM: PORTABLE CHEST 1 VIEW COMPARISON:  January 19, 2018 FINDINGS: Endotracheal tube tip is 3.7 cm above the carina. Nasogastric tube tip and side port are below the diaphragm. Central catheter tip is in the superior vena cava. No pneumothorax. There is moderate interstitial and patchy alveolar opacities, slightly increased. There is a small left pleural effusion. Heart is upper normal in size with pulmonary venous hypertension. No adenopathy. No bone lesions. IMPRESSION: Tube and catheter positions as described without pneumothorax. Increase interstitial and patchy alveolar pulmonary edema, likely indicative of a degree of congestive heart failure. Small left pleural effusion. Underlying pulmonary vascular congestion. Electronically Signed   By: Bretta BangWilliam  Woodruff III M.D.   On: 01/21/2018 07:04     ASSESSMENT AND PLAN:   57 yo hx of HTN, Hyperlipidemia, GERD, Diabetes, who presented to the hospital for shortness of breath and noted to be in septic shock secondary to pneumonia.  1. acute severe septic shock -Patient is now off vasopressors.  Patient has finished treatment of underlying pneumonia UTI with IV antibiotics.  2. acutepneumonia/UTI -Patient has finished treatment with IV antibiotics for the pneumonia and UTI.  3.GI bleed  - Hg. Stable.   - continuePPI  4. acute respiratory failure with hypoxia-this was secondary  to pneumonia.  Patient was difficult to wean off the ventilator. -Seen by ENT and plan for tracheostomy on Thursday.   -Continue supportive  care as per pulmonary and aggressive pulmonary toileting for now.  Continue Pulmicort nebs, duo nebs.  5.  Atrial fibrillation with rapid ventricular response- now in normal sinus rhythm  off Amio gtt. Cont. Oral Amio, Digoxin.   6.AKI-resolved and creatinine at baseline.  7. chronic diabetes mellitus type 2 - cont. SSI and follow BS  8. Depression - cont. Prozac/Depakote.   9.  Nutrition-patient is status post PEG tube placement today.  Plan for starting tube feedings tomorrow.  Can use PEG for meds today.  Placement to LTAC after Trach placed on thursday  All the records are reviewed and case discussed with Care Management/Social Worker. Management plans discussed with the patient, family and they are in agreement.  CODE STATUS: Full code  DVT Prophylaxis: Lovenox  TOTAL TIME TAKING CARE OF THIS PATIENT: 25 minutes.   POSSIBLE D/C unclear DEPENDING ON CLINICAL CONDITION.   Houston Siren M.D on 01/21/2018 at 3:23 PM  Between 7am to 6pm - Pager - [phone]  After 6pm go to [email or web address] - Social research officer, government  [facility] [place] [facility]  Office  [phone]  CC: Primary care physician; Patient, [name]

## 2018-01-21 NOTE — Progress Notes (Signed)
PULMONARY / CRITICAL CARE MEDICINE   Name: Tommy Mathews MRN: [id number] DOB: [birth year]    ADMISSION DATE:  01/06/2018   PT PROFILE:  43 M SNF resident due to psychiatric illness and history of TBI admitted with acute hypoxemic respiratory failure, severe bilateral pulmonary infiltrates, severe sepsis/septic shock.  Intubated in the ED shortly after arrival.  Was also noted to have dark coffee-ground material suctioned from stomach.  Patient chronically on rivaroxaban.   MAJOR EVENTS/TEST RESULTS: 04/08 Admission as above 04/08 CT chest: Multifocal severe pneumonia with RUL collapse and soft tissue obstructing RUL bronchus.  04/08 CTAP: Mild small bowel ileus, potential enteritis. Nasogastric tube terminates in distal stomach. Cholelithiasis without CT findings of acute cholecystitis.Consider bronchoscopy.  04/09 nephrology consultation: No acute indication for dialysis at present. 04/09 gastroenterology consultation: Recommend high-dose PPI therapy. 04/10 severely hypoxemic despite 100% FiO2.  Recruitment maneuver with significant improvement.  PEEP increased and ARDS strategy on ventilator implemented.  Neuromuscular blockade implemented due to ventilator dyssynchrony. 04/11 PAF with RVR > converted with amiodarone. Gas exchange improving. Off Nimbex 04/12 Echocardiogram: EF 20-25%.  Diffuse HK. 04/13 High airway pressure with vent dyssynchony; given Vecuronium; FIO2 increased to 50%  04/16 Cardiology consultation 04/13 Amiodarone D/C because of SB 04/15 increased HR, EF 20% afib with RVR 04/17 failed weaning trials, HR remains elevated 04/19 ENT consultation for trach tube placement 04/20 no significant changes in the last 24 hours. Has not tolerated any weaning  04/23 PEG placed. 04/23 intermittent ventilator dyssynchrony - sedation regimen adjusted  INDWELLING DEVICES: L femoral CVL 04/08 >> 4/16 ETT 04/08 >>  RUE PICC 4/15 >>  PEG 04/23 >>   MICRO DATA: MRSA PCR 04/08, 04/20  >> POS Urine 04/08 >> 10k coag negative staph Resp 04/09 >> c/w NOF Blood 04/08 >> NEG Blood 04/09 >> NEG Resp 04/20 >> c/w NOF  ANTIMICROBIALS:  Unasyn 04/08 >> 04/10 Vanc 04/08 >> 4/15 Cefepime 04/10 >> 4/15 Metronidazole 04/10 >> 4/15   SUBJECTIVE:  Remains intubated.  Continues to require high sedation due to ventilator dyssynchrony.  Not following commands.  At this point, concern is that the propofol is contributing to ventilator dyssynchrony   VITAL SIGNS: BP (!) 157/67   Pulse 94   Temp (!) 95.7 F (35.4 C)   Resp (!) 0   Ht 5\' 10"  (1.778 m)   Wt (!) 307 lb 8.7 oz (139.5 kg)   SpO2 93%   BMI 44.13 kg/m    VENTILATOR SETTINGS: Vent Mode: PCV FiO2 (%):  [30 %] 30 % Set Rate:  [20 bmp] 20 bmp PEEP:  [5 cmH20] 5 cmH20 Plateau Pressure:  [21 cmH20-24 cmH20] 21 cmH20  INTAKE / OUTPUT: I/O last 3 completed shifts: In: 4077.8 [I.V.:1965.8; NG/GT:1612; IV Piggyback:500] Out: 3905 [Urine:3905]  PHYSICAL EXAMINATION: General: RASS - 4, not F/C Neuro: PERRLA, EOMI, DTRs symmetric, moves all extremities spontaneously HEENT: PERRL, sclerae white Cardiovascular: Tachy, reg, no M Lungs: bilateral rhonchi anteriorly, no wheezes Abdomen: Mildly distended, bowel sounds present GU: severe scrotal edema Extremities: Warm, anasarca Skin: Sacral decubitus pressure ulcer  LABS:  BMET Recent Labs  Lab 01/19/18 0347 01/20/18 0109 01/20/18 0411 01/21/18 0500  NA 146*  --  146* 147*  K 3.7  --  3.6 3.3*  CL 109  --  108 110  CO2 28  --  30 33*  BUN 37*  --  52* 51*  CREATININE 0.89 1.05 1.05 1.04  GLUCOSE 294*  --  334* 193*    Electrolytes  Recent Labs  Lab 01/17/18 0425 01/18/18 0526 01/19/18 0347 01/20/18 0411 01/21/18 0500  CALCIUM 6.9* 7.7* 7.9* 7.5* 7.4*  MG 1.6* 1.8  --  1.7  --   PHOS 2.8 3.3  --  3.6  --     CBC Recent Labs  Lab 01/18/18 0526 01/19/18 0347 01/21/18 0500  WBC 6.6 8.4 13.5*  HGB 9.2* 10.3* 8.3*  HCT 28.2* 31.3* 23.8*  PLT  174 187 155    Coag's Recent Labs  Lab 01/20/18 0411  INR 1.04    Sepsis Markers Recent Labs  Lab 01/18/18 0526 01/19/18 0347 01/20/18 0411  PROCALCITON 0.27 0.22 0.43    ABG Recent Labs  Lab 01/17/18 0501 01/18/18 0626 01/18/18 1751  PHART 7.41 7.49* 7.43  PCO2ART 59* 48 55*  PO2ART 150* 71* 132*    Liver Enzymes Recent Labs  Lab 01/18/18 0526 01/20/18 0411 01/21/18 0500  AST 22 38 30  ALT 20 39 30  ALKPHOS 74 125 96  BILITOT 0.3 0.3 0.5  ALBUMIN 1.7* 1.7* 1.7*    Cardiac Enzymes No results for input(s): TROPONINI, PROBNP in the last 168 hours.  Glucose Recent Labs  Lab 01/20/18 1643 01/20/18 1946 01/20/18 2334 01/21/18 0405 01/21/18 0711 01/21/18 1148  GLUCAP 239* 207* 206* 170* 161* 119*    CXR: NSC edema versus ARDS pattern    ASSESSMENT / PLAN: PULMONARY A: Acute hypoxemic respiratory failure Aspiration pneumonia ARDS, resolving Pulmonary edema Prolonged mechanical ventilation P:   Cont full vent support - settings reviewed and/or adjusted Cont vent bundle Daily SBT if/when meets criteria  Continue PCV mode tfor ventilator synchrony Tracheostomy tube planned 04/25  CARDIOVASCULAR A: septic shock, resolved Paroxysmal atrial fibrillation Dilated cardiomyopathy, etiology unclear P:  MAP goal > 65 mmHg Continue amiodarone (enteral load) At some point, will require further cardiac evaluation of cardiomyopathy  RENAL A:   AKI, resolved Hypernatremia Severe hypervolemia/anasarca P: Monitor BMET intermittently Monitor I/Os Correct electrolytes as indicated  D5W initiated 04/23 Further diuresis ordered 04/23  GASTROINTESTINAL A:   Abdominal distention, resolved Suspected UGIB, resolved P:   SUP: Enteral PPI Continue TF protocol  HEMATOLOGIC A: ICU acquired anemia No evidence of acute blood loss P: DVT px: enoxaparin Monitor CBC intermittently Transfuse per usual guidelines   INFECTIOUS A:   Severe sepsis  with shock, resolved Aspiration pneumonia, treated P:   Monitor temp, WBC count Micro and abx as above   ENDOCRINE A:   Type 2 diabetes, much improved control P:   Continue Lantus (initiated 04/21) Continue resistance scale SSI  NEUROLOGIC A:   Ventilator dyssynchrony  ICU/ventilator associated discomfort History of schizophrenia History of TBI P:   RASS goal: -2, -3 Discontinue propofol Continue fentanyl infusion Continue Lorazepam and intermittent fentanyl as needed  Family updated in detail at bedside  CCM time: 35 minutes The above time includes time spent in consultation with patient and/or family members and reviewing care plan on multidisciplinary rounds  Billy Fischeravid Imaan Padgett, MD PCCM service Mobile [phone] Pager [phone] 01/21/2018 3:09 PM

## 2018-01-22 ENCOUNTER — Encounter: Payer: Self-pay | Admitting: Gastroenterology

## 2018-01-22 DIAGNOSIS — R451 Restlessness and agitation: Secondary | ICD-10-CM

## 2018-01-22 DIAGNOSIS — F209 Schizophrenia, unspecified: Secondary | ICD-10-CM

## 2018-01-22 DIAGNOSIS — G9340 Encephalopathy, unspecified: Secondary | ICD-10-CM

## 2018-01-22 LAB — BLOOD GAS, ARTERIAL
ACID-BASE EXCESS: 13.6 mmol/L — AB (ref 0.0–2.0)
BICARBONATE: 45.6 mmol/L — AB (ref 20.0–28.0)
FIO2: 1
MECHVT: 500 mL
PCO2 ART: 114 mmHg — AB (ref 32.0–48.0)
PEEP/CPAP: 15 cmH2O
Patient temperature: 37
RATE: 20 resp/min
pH, Arterial: 7.21 — ABNORMAL LOW (ref 7.350–7.450)

## 2018-01-22 LAB — CBC
HEMATOCRIT: 29.2 % — AB (ref 40.0–52.0)
HEMOGLOBIN: 9.2 g/dL — AB (ref 13.0–18.0)
MCH: 27.1 pg (ref 26.0–34.0)
MCHC: 31.6 g/dL — AB (ref 32.0–36.0)
MCV: 85.7 fL (ref 80.0–100.0)
Platelets: 189 10*3/uL (ref 150–440)
RBC: 3.4 MIL/uL — AB (ref 4.40–5.90)
RDW: 18.6 % — ABNORMAL HIGH (ref 11.5–14.5)
WBC: 16.5 10*3/uL — ABNORMAL HIGH (ref 3.8–10.6)

## 2018-01-22 LAB — GLUCOSE, CAPILLARY
GLUCOSE-CAPILLARY: 132 mg/dL — AB (ref 65–99)
GLUCOSE-CAPILLARY: 129 mg/dL — AB (ref 65–99)
GLUCOSE-CAPILLARY: 134 mg/dL — AB (ref 65–99)
Glucose-Capillary: 152 mg/dL — ABNORMAL HIGH (ref 65–99)
Glucose-Capillary: 128 mg/dL — ABNORMAL HIGH (ref 65–99)

## 2018-01-22 LAB — BASIC METABOLIC PANEL
ANION GAP: 3 — AB (ref 5–15)
BUN: 39 mg/dL — ABNORMAL HIGH (ref 6–20)
CALCIUM: 7.7 mg/dL — AB (ref 8.9–10.3)
CHLORIDE: 107 mmol/L (ref 101–111)
CO2: 37 mmol/L — AB (ref 22–32)
Creatinine, Ser: 0.79 mg/dL (ref 0.61–1.24)
GFR calc non Af Amer: >60 mL/min (ref >60)
GLUCOSE: 154 mg/dL — AB (ref 65–99)
POTASSIUM: 4.4 mmol/L (ref 3.5–5.1)
Sodium: 147 mmol/L — ABNORMAL HIGH (ref 135–145)

## 2018-01-22 MED ORDER — AMIODARONE HCL 200 MG PO TABS
200.0000 mg | ORAL_TABLET | Freq: Every day | ORAL | Status: DC
  Administered 2018-01-22 – 2018-01-28 (×7): 200 mg
  Filled 2018-01-22 (×7): qty 1

## 2018-01-22 MED ORDER — ACETAZOLAMIDE SODIUM 500 MG IJ SOLR
500.0000 mg | Freq: Once | INTRAMUSCULAR | Status: AC
  Administered 2018-01-22: 500 mg via INTRAVENOUS
  Filled 2018-01-22 (×2): qty 500

## 2018-01-22 MED ORDER — QUETIAPINE FUMARATE 25 MG PO TABS
100.0000 mg | ORAL_TABLET | Freq: Two times a day (BID) | ORAL | Status: DC
  Administered 2018-01-22 – 2018-01-29 (×15): 100 mg
  Filled 2018-01-22 (×15): qty 4

## 2018-01-22 MED ORDER — INSULIN GLARGINE 100 UNIT/ML ~~LOC~~ SOLN
20.0000 [IU] | Freq: Every day | SUBCUTANEOUS | Status: DC
  Administered 2018-01-22 – 2018-02-10 (×19): 20 [IU] via SUBCUTANEOUS
  Filled 2018-01-22 (×25): qty 0.2

## 2018-01-22 MED ORDER — INSULIN GLARGINE 100 UNIT/ML ~~LOC~~ SOLN
10.0000 [IU] | Freq: Once | SUBCUTANEOUS | Status: AC
  Administered 2018-01-22: 10 [IU] via SUBCUTANEOUS
  Filled 2018-01-22: qty 0.1

## 2018-01-22 MED ORDER — ENOXAPARIN SODIUM 40 MG/0.4ML ~~LOC~~ SOLN: 40.0000 mg | Freq: Two times a day (BID) | SUBCUTANEOUS | Status: DC

## 2018-01-22 MED ORDER — INSULIN GLARGINE 100 UNIT/ML ~~LOC~~ SOLN
35.0000 [IU] | Freq: Every day | SUBCUTANEOUS | Status: DC
  Filled 2018-01-22: qty 0.35

## 2018-01-22 MED ORDER — VALPROIC ACID 250 MG/5ML PO SOLN
1000.0000 mg | Freq: Two times a day (BID) | ORAL | Status: DC
  Administered 2018-01-22 – 2018-02-23 (×65): 1000 mg
  Filled 2018-01-22 (×75): qty 20

## 2018-01-22 MED ORDER — STERILE WATER FOR INJECTION IJ SOLN
INTRAMUSCULAR | Status: AC
  Administered 2018-01-22: 10 mL
  Filled 2018-01-22: qty 10

## 2018-01-22 NOTE — Progress Notes (Signed)
PULMONARY / CRITICAL CARE MEDICINE   Name: Tommy Mathews MRN: [id number] DOB: Jul 26, 1961    ADMISSION DATE:  01/06/2018   PT PROFILE:  5 M SNF resident due to psychiatric illness and history of TBI admitted with acute hypoxemic respiratory failure, severe bilateral pulmonary infiltrates, severe sepsis/septic shock.  Intubated in the ED shortly after arrival.  Was also noted to have dark coffee-ground material suctioned from stomach.  Patient chronically on rivaroxaban.   MAJOR EVENTS/TEST RESULTS: 04/08 Admission as above 04/08 CT chest: Multifocal severe pneumonia with RUL collapse and soft tissue obstructing RUL bronchus.  04/08 CTAP: Mild small bowel ileus, potential enteritis. Nasogastric tube terminates in distal stomach. Cholelithiasis without CT findings of acute cholecystitis.Consider bronchoscopy.  04/09 nephrology consultation: No acute indication for dialysis at present. 04/09 gastroenterology consultation: Recommend high-dose PPI therapy. 04/10 severely hypoxemic despite 100% FiO2.  Recruitment maneuver with significant improvement.  PEEP increased and ARDS strategy on ventilator implemented.  Neuromuscular blockade implemented due to ventilator dyssynchrony. 04/11 PAF with RVR > converted with amiodarone. Gas exchange improving. Off Nimbex 04/12 Echocardiogram: EF 20-25%.  Diffuse HK. 04/13 High airway pressure with vent dyssynchony; given Vecuronium; FIO2 increased to 50%  04/16 Cardiology consultation 04/13 Amiodarone D/C because of SB 04/15 increased HR, EF 20% afib with RVR 04/17 failed weaning trials, HR remains elevated 04/19 ENT consultation for trach tube placement 04/20 no significant changes in the last 24 hours. Has not tolerated any weaning  04/23 PEG placed. 04/23 intermittent ventilator dyssynchrony - sedation regimen adjusted 04/24 Somewhat more responsive. Not F/C  INDWELLING DEVICES: L femoral CVL 04/08 >> 4/16 ETT 04/08 >>  RUE PICC 4/15 >>  PEG 04/23  >>   MICRO DATA: MRSA PCR 04/08, 04/20 >> POS Urine 04/08 >> 10k coag negative staph Resp 04/09 >> c/w NOF Blood 04/08 >> NEG Blood 04/09 >> NEG Resp 04/20 >> c/w NOF  ANTIMICROBIALS:  Unasyn 04/08 >> 04/10 Vanc 04/08 >> 4/15 Cefepime 04/10 >> 4/15 Metronidazole 04/10 >> 4/15   SUBJECTIVE:  Remains intubated.  Somewhat more responsive. Not F/C   VITAL SIGNS: BP 128/73   Pulse 84   Temp 97.9 F (36.6 C)   Resp 13   Ht 5\' 10"  (1.778 m)   Wt (!) 307 lb 8.7 oz (139.5 kg)   SpO2 99%   BMI 44.13 kg/m    VENTILATOR SETTINGS: Vent Mode: PCV FiO2 (%):  [30 %-40 %] 40 % Set Rate:  [14 bmp] 14 bmp PEEP:  [5 cmH20] 5 cmH20  INTAKE / OUTPUT: I/O last 3 completed shifts: In: 4479.6 [I.V.:3474.6; NG/GT:955; IV Piggyback:50] Out: 5700 [Urine:5700]  PHYSICAL EXAMINATION: General: RASS - 3, not F/C Neuro: Cranial nerves intact, moves all extremities  HEENT: NCAT, sclerae white Cardiovascular: Regular, no M Lungs: Few scattered rhonchi, no wheezes Abdomen: Nontender, + BS GU: NSC severe scrotal edema Extremities: Somewhat improved BUE edema, symmetric BLE edema  LABS:  BMET Recent Labs  Lab 01/20/18 0411 01/21/18 0500 01/22/18 0407  NA 146* 147* 147*  K 3.6 3.3* 4.4  CL 108 110 107  CO2 30 33* 37*  BUN 52* 51* 39*  CREATININE 1.05 1.04 0.79  GLUCOSE 334* 193* 154*    Electrolytes Recent Labs  Lab 01/17/18 0425 01/18/18 0526  01/20/18 0411 01/21/18 0500 01/22/18 0407  CALCIUM 6.9* 7.7*   < > 7.5* 7.4* 7.7*  MG 1.6* 1.8  --  1.7  --   --   PHOS 2.8 3.3  --  3.6  --   --    < > =  values in this interval not displayed.    CBC Recent Labs  Lab 01/19/18 0347 01/21/18 0500 01/22/18 0407  WBC 8.4 13.5* 16.5*  HGB 10.3* 8.3* 9.2*  HCT 31.3* 23.8* 29.2*  PLT 187 155 189    Coag's Recent Labs  Lab 01/20/18 0411  INR 1.04    Sepsis Markers Recent Labs  Lab 01/18/18 0526 01/19/18 0347 01/20/18 0411  PROCALCITON 0.27 0.22 0.43     ABG Recent Labs  Lab 01/17/18 0501 01/18/18 0626 01/18/18 1751  PHART 7.41 7.49* 7.43  PCO2ART 59* 48 55*  PO2ART 150* 71* 132*    Liver Enzymes Recent Labs  Lab 01/18/18 0526 01/20/18 0411 01/21/18 0500  AST 22 38 30  ALT 20 39 30  ALKPHOS 74 125 96  BILITOT 0.3 0.3 0.5  ALBUMIN 1.7* 1.7* 1.7*    Cardiac Enzymes No results for input(s): TROPONINI, PROBNP in the last 168 hours.  Glucose Recent Labs  Lab 01/21/18 1148 01/21/18 1933 01/21/18 2321 01/22/18 0327 01/22/18 0816 01/22/18 1152  GLUCAP 119* 157* 148* 152* 128* 132*    CXR: No new film    ASSESSMENT / PLAN: PULMONARY A: Acute hypoxemic respiratory failure Aspiration pneumonia ARDS, resolving Pulmonary edema Prolonged mechanical ventilation P:   Cont full vent support - settings reviewed and/or adjusted  PCV mode for ventilator synchrony Cont vent bundle Daily SBT if/when meets criteria  Tracheostomy tube planned 04/25  CARDIOVASCULAR A: septic shock, resolved Paroxysmal atrial fibrillation Dilated cardiomyopathy, etiology unclear P:  MAP goal > 65 mmHg Continue amiodarone (enteral load) At some point, will require further cardiac evaluation of cardiomyopathy  RENAL A:   AKI, resolved Hypernatremia Severe hypervolemia/anasarca Metabolic alkalosis due to loop diuresis P: Monitor BMET intermittently Monitor I/Os Correct electrolytes as indicated  Continue D5W initiated 04/23 Acetazolamide x 1 dose 04/24  GASTROINTESTINAL A:   Abdominal distention, resolved Suspected UGIB, resolved G tube status P:   SUP: Enteral PPI Continue TF protocol  HEMATOLOGIC A: ICU acquired anemia No evidence of acute blood loss P: DVT px: enoxaparin (hold for trach tube 04/25) Monitor CBC intermittently Transfuse per usual guidelines   INFECTIOUS A:   Severe sepsis with shock, resolved Aspiration pneumonia, treated P:   Monitor temp, WBC count Micro and abx as above    ENDOCRINE A:   Type 2 diabetes, controlled P:   Continue Lantus (initiated 04/21) Continue resistant scale SSI  NEUROLOGIC A:   Ventilator dyssynchrony, improved ICU/ventilator associated discomfort History of schizophrenia History of TBI P:   RASS goal: -1, -2 Continue fentanyl infusion for now.  Continue Lorazepam and intermittent fentanyl as needed Initiate quetiapine 04/24  Family updated in detail at bedside  CCM time:  The above time includes time spent in consultation with patient and/or family members and reviewing care plan on multidisciplinary rounds  Billy Fischeravid Roniyah Llorens, MD PCCM service Mobile [phone] Pager [phone] 01/22/2018 3:40 PM

## 2018-01-22 NOTE — Progress Notes (Signed)
Patient having facial twitching, Dr. Sung AmabileSimonds notified. No new orders currently. [name]

## 2018-01-22 NOTE — Progress Notes (Signed)
[name] , MD [address], [place], [place], [place], [address] [address], [place], [place], [place], [address] Phone: [phone]  Fax: [phone]   Tommy Mathews is being followed for PEG tube  Day 1 of follow up   Subjective: Unresponsive intubated    Objective: Vital signs in last 24 hours: Vitals:   01/22/18 0900 01/22/18 1000 01/22/18 1002 01/22/18 1100  BP: (!) 143/78 (!) 156/80 (!) 156/80 136/70  Pulse: 95 92 97 90  Resp: '13 13  14  ' Temp: 98.6 F (37 C) 98.4 F (36.9 C)  98.4 F (36.9 C)  TempSrc:      SpO2: 91% 96%  99%  Weight:      Height:       Weight change:   Intake/Output Summary (Last 24 hours) at 01/22/2018 1140 Last data filed at 01/22/2018 1000 Gross per 24 hour  Intake 3557.17 ml  Output 4950 ml  Net -1392.83 ml     Exam: Heart:: Regular rate and rhythm, S1S2 present or without murmur or extra heart sounds Lungs: normal, clear to auscultation and clear to auscultation and percussion Abdomen: soft, non tender, non distended, G tube loosened , bumper placed 1 cm mark from abdominal wall, rotates freely . No abnormal discharge,    Lab Results: '@LABTEST2' @ Micro Results: Recent Results (from the past 240 hour(s))  Culture, expectorated sputum-assessment     Status: None   Collection Time: 01/18/18  5:07 PM  Result Value Ref Range Status   Specimen Description ENDOTRACHEAL  Final   Special Requests NONE  Final   Sputum evaluation   Final    THIS SPECIMEN IS ACCEPTABLE FOR SPUTUM CULTURE Performed at [facility], [address], [place], [place] [address]    Report Status 01/18/2018 FINAL  Final  Culture, respiratory (NON-Expectorated)     Status: None   Collection Time: 01/18/18  5:07 PM  Result Value Ref Range Status   Specimen Description   Final    ENDOTRACHEAL Performed at [facility], [address] [place], [place] [address]    Special Requests   Final    NONE Reflexed from [id number] Performed  at [facility], [address] [place], [place] [address]    Gram Stain   Final    MODERATE WBC PRESENT,BOTH PMN AND MONONUCLEAR NO SQUAMOUS EPITHELIAL CELLS SEEN RARE BUDDING YEAST SEEN    Culture   Final    RARE Consistent with normal respiratory flora. Performed at [facility], [place] [address] [place], [place] [address]    Report Status 01/21/2018 FINAL  Final  MRSA PCR Screening     Status: Abnormal   Collection Time: 01/18/18  8:26 PM  Result Value Ref Range Status   MRSA by PCR POSITIVE (A) NEGATIVE Final    Comment:        The GeneXpert MRSA Assay (FDA approved for NASAL specimens only), is one component of a comprehensive MRSA colonization surveillance program. It is not intended to diagnose MRSA infection nor to guide or monitor treatment for MRSA infections. RESULT CALLED TO, READ BACK BY AND VERIFIED WITH: ANGELA BREHUN AT 2200 ON 01/18/18 RWW Performed at [facility], [address] [place], [place] [address]    Studies/Results: Dg Chest Port 1 View  Result Date: 01/21/2018 CLINICAL DATA:  Hypoxia EXAM: PORTABLE CHEST 1 VIEW COMPARISON:  January 19, 2018 FINDINGS: Endotracheal tube tip is 3.7 cm above the carina. Nasogastric tube tip and side  port are below the diaphragm. Central catheter tip is in the superior vena cava. No pneumothorax. There is moderate interstitial and patchy alveolar opacities, slightly increased. There is a small left pleural effusion. Heart is upper normal in size with pulmonary venous hypertension. No adenopathy. No bone lesions. IMPRESSION: Tube and catheter positions as described without pneumothorax. Increase interstitial and patchy alveolar pulmonary edema, likely indicative of a degree of congestive heart failure. Small left pleural effusion. Underlying pulmonary vascular congestion. Electronically Signed   By: Lowella Grip III M.D.   On: 01/21/2018 07:04   Medications: I have reviewed the patient's current  medications. Scheduled Meds: . amiodarone  200 mg Per Tube Daily  . budesonide (PULMICORT) nebulizer solution  0.5 mg Nebulization BID  . chlorhexidine gluconate (MEDLINE KIT)  15 mL Mouth Rinse BID  . enoxaparin (LOVENOX) injection  40 mg Subcutaneous Daily  . feeding supplement (PRO-STAT SUGAR FREE 64)  60 mL Per Tube BID  . feeding supplement (VITAL HIGH PROTEIN)  1,000 mL Per Tube Q24H  . free water  200 mL Per Tube Q4H  . insulin aspart  0-20 Units Subcutaneous Q4H  . insulin glargine  35 Units Subcutaneous QHS  . ipratropium-albuterol  3 mL Nebulization Q6H  . mouth rinse  15 mL Mouth Rinse 10 times per day  . metoprolol tartrate  12.5 mg Per Tube TID  . multivitamin  15 mL Per Tube Daily  . pantoprazole sodium  40 mg Per Tube Daily  . QUEtiapine  100 mg Per Tube BID  . senna-docusate  2 tablet Per Tube BID  . Valproate Sodium  1,000 mg Per Tube BID   Continuous Infusions: . dextrose 5 % with kcl 100 mL/hr at 01/22/18 0806  . fentaNYL infusion INTRAVENOUS 250 mcg/hr (01/22/18 1105)   PRN Meds:.acetaminophen, bisacodyl, fentaNYL, hydrALAZINE, ipratropium-albuterol, LORazepam, sodium chloride flush   Assessment: Principal Problem:   Severe sepsis with septic shock (HCC) Active Problems:   GI bleed   Acute respiratory failure with hypoxia (HCC)   Multifocal pneumonia   UTI (urinary tract infection)   AKI (acute kidney injury) ([place])   Ileus (HCC)   Diabetes ([place])   Pressure injury of skin   Peg tube placed endoscopically yesterday on 01/21/18   Plan: 1. Loosened bumper  2. Can use for feeding  3. Do not place any dressing between bumper and abdominal wall  4. Clean area daily   I will sign off.  Please call me if any further GI concerns or questions.  We would like to thank you for the opportunity to participate in the care of Tommy Mathews.    LOS: 16 days   [name], MD 01/22/2018, 11:40 AM

## 2018-01-22 NOTE — Progress Notes (Signed)
Anticoagulation monitoring(Lovenox):  57 yo male ordered Lovenox 40 mg Q24h  Filed Weights   01/13/18 0100 01/19/18 0347 01/20/18 1200  Weight: 286 lb 13.1 oz (130.1 kg) (!) 301 lb 5.9 oz (136.7 kg) (!) 307 lb 8.7 oz (139.5 kg)   BMI 44.13    Lab Results  Component Value Date   CREATININE 0.79 01/22/2018   CREATININE 1.04 01/21/2018   CREATININE 1.05 01/20/2018   Estimated Creatinine Clearance: 147 mL/min (by C-G formula based on SCr of 0.79 mg/dL). Hemoglobin & Hematocrit     Component Value Date/Time   HGB 9.2 (L) 01/22/2018 0407   HCT 29.2 (L) 01/22/2018 0407     Per Protocol for Patient with estCrcl > 30 ml/min and BMI > 40, will transition to Lovenox 40 mg Q12h.

## 2018-01-22 NOTE — Progress Notes (Signed)
[facility] - [place] at [facility]   PATIENT NAME: Tommy Mathews    MR#:  [id number]  DATE OF BIRTH:  Dec 22, 1960  SUBJECTIVE:   Patient remains intubated, sedated.  No acute events overnight.  Plan for Trach on Thursday.  Started on tube feedings and tolerating it.    REVIEW OF SYSTEMS:    Review of Systems  Unable to perform ROS: Intubated    Nutrition: Tube feeds Tolerating Diet: Yes Tolerating PT: sedated & intubated.    DRUG ALLERGIES:  Not on File  VITALS:  Blood pressure 136/70, pulse 90, temperature 98.4 F (36.9 C), resp. rate 14, height 5\' 10"  (1.778 m), weight (!) 139.5 kg (307 lb 8.7 oz), SpO2 99 %.  PHYSICAL EXAMINATION:   Physical Exam  GENERAL:  57 y.o.-year-old critically ill patient lying in bed intubated & sedated EYES: Pupils equal, round, reactive to light. No scleral icterus.  HEENT: Head atraumatic, normocephalic. ET and OG tubes in place.  NECK:  Supple, no jugular venous distention. No thyroid enlargement, no tenderness.  LUNGS: Poor Resp. effort, no wheezing, rales, rhonchi. No use of accessory muscles of respiration.  CARDIOVASCULAR: S1, S2 normal. No murmurs, rubs, or gallops.  ABDOMEN: Soft, nontender, nondistended. Bowel sounds present. No organomegaly or mass.  PEG tube in place with no acute bleeding around dressing.  EXTREMITIES: No cyanosis, clubbing, +1-2 edema b/l and anasarca.  NEUROLOGIC: Sedated & intubated  PSYCHIATRIC: Sedated & intubated.   SKIN: No obvious rash, lesion, or ulcer.   GU - + Scrotal Edema  LABORATORY PANEL:   CBC Recent Labs  Lab 01/22/18 0407  WBC 16.5*  HGB 9.2*  HCT 29.2*  PLT 189   ------------------------------------------------------------------------------------------------------------------  Chemistries  Recent Labs  Lab 01/20/18 0411 01/21/18 0500 01/22/18 0407  NA 146* 147* 147*  K 3.6 3.3* 4.4  CL 108 110 107  CO2 30 33* 37*  GLUCOSE 334* 193* 154*  BUN 52* 51* 39*   CREATININE 1.05 1.04 0.79  CALCIUM 7.5* 7.4* 7.7*  MG 1.7  --   --   AST 38 30  --   ALT 39 30  --   ALKPHOS 125 96  --   BILITOT 0.3 0.5  --    ------------------------------------------------------------------------------------------------------------------  Cardiac Enzymes No results for input(s): TROPONINI in the last 168 hours. ------------------------------------------------------------------------------------------------------------------  RADIOLOGY:  Dg Chest Port 1 View  Result Date: 01/21/2018 CLINICAL DATA:  Hypoxia EXAM: PORTABLE CHEST 1 VIEW COMPARISON:  January 19, 2018 FINDINGS: Endotracheal tube tip is 3.7 cm above the carina. Nasogastric tube tip and side port are below the diaphragm. Central catheter tip is in the superior vena cava. No pneumothorax. There is moderate interstitial and patchy alveolar opacities, slightly increased. There is a small left pleural effusion. Heart is upper normal in size with pulmonary venous hypertension. No adenopathy. No bone lesions. IMPRESSION: Tube and catheter positions as described without pneumothorax. Increase interstitial and patchy alveolar pulmonary edema, likely indicative of a degree of congestive heart failure. Small left pleural effusion. Underlying pulmonary vascular congestion. Electronically Signed   By: [name].   On: 01/21/2018 07:04     ASSESSMENT AND PLAN:   57 yo hx of HTN, Hyperlipidemia, GERD, Diabetes, who presented to the hospital for shortness of breath and noted to be in septic shock secondary to pneumonia.  1. acute severe septic shock -Patient is now off vasopressors.  Patient has finished treatment of underlying pneumonia UTI with IV antibiotics.  2. acutepneumonia/UTI -  Patient has finished treatment with IV antibiotics for the pneumonia and UTI.  3.GI bleed  - Hg. Stable.   - continuePPI  4. acute respiratory failure with hypoxia-this was secondary to pneumonia.  Patient was  difficult to wean off the ventilator. -Seen by ENT and plan for tracheostomy this Thursday.   -Continue supportive care as per pulmonary and aggressive pulmonary toileting for now.  Continue Pulmicort nebs, duo nebs.  5.  Atrial fibrillation with rapid ventricular response- now in normal sinus rhythm  off Amio gtt. Cont. Oral Amio, Metoprolol.   6.AKI-resolved and creatinine at baseline.  7. chronic diabetes mellitus type 2 - cont. SSI and follow BS  8. Depression - cont. Prozac/Depakote.   9.  Nutrition-patient is status post PEG tube placement today.  Started on tube feeds and tolerating it well.   10. Generalized Anasarca/swelling - cont. Lasix PRN.    LTAC placement post PEG/TRAch.   All the records are reviewed and case discussed with Care Management/Social Worker. Management plans discussed with the patient, family and they are in agreement.  CODE STATUS: Full code  DVT Prophylaxis: Lovenox  TOTAL TIME TAKING CARE OF THIS PATIENT: 56 minutes.   POSSIBLE D/C unclear DEPENDING ON CLINICAL CONDITION.   [name],Shalev Helminiak J M.D on 01/22/2018 at 11:57 AM  Between 7am to 6pm - Pager - [phone]  After 6pm go to [email or web address] - Social research officer, governmentpassword EPAS [email or web address]  [facility] [place] [facility]  Office  [phone]  CC: Primary care physician; Patient, [name]

## 2018-01-23 ENCOUNTER — Encounter: Payer: Self-pay | Admitting: Anesthesiology

## 2018-01-23 ENCOUNTER — Encounter
Admission: EM | Disposition: A | Discharge status: Skilled Nursing Facility | Payer: Self-pay | Source: Home / Self Care | Attending: Internal Medicine

## 2018-01-23 ENCOUNTER — Inpatient Hospital Stay: Payer: Medicaid Other | Admitting: Anesthesiology

## 2018-01-23 ENCOUNTER — Inpatient Hospital Stay: Payer: Medicaid Other

## 2018-01-23 DIAGNOSIS — E8770 Fluid overload, unspecified: Secondary | ICD-10-CM

## 2018-01-23 HISTORY — PX: TRACHEOSTOMY TUBE PLACEMENT: SHX814

## 2018-01-23 LAB — CBC
HCT: 29.7 % — ABNORMAL LOW (ref 40.0–52.0)
Hemoglobin: 9.4 g/dL — ABNORMAL LOW (ref 13.0–18.0)
MCH: 27.5 pg (ref 26.0–34.0)
MCHC: 31.7 g/dL — AB (ref 32.0–36.0)
MCV: 86.7 fL (ref 80.0–100.0)
PLATELETS: 169 10*3/uL (ref 150–440)
RBC: 3.42 MIL/uL — ABNORMAL LOW (ref 4.40–5.90)
RDW: 18.9 % — AB (ref 11.5–14.5)
WBC: 15 10*3/uL — ABNORMAL HIGH (ref 3.8–10.6)

## 2018-01-23 LAB — GLUCOSE, CAPILLARY
GLUCOSE-CAPILLARY: 105 mg/dL — AB (ref 65–99)
GLUCOSE-CAPILLARY: 119 mg/dL — AB (ref 65–99)
GLUCOSE-CAPILLARY: 147 mg/dL — AB (ref 65–99)
Glucose-Capillary: 124 mg/dL — ABNORMAL HIGH (ref 65–99)
Glucose-Capillary: 132 mg/dL — ABNORMAL HIGH (ref 65–99)
Glucose-Capillary: 141 mg/dL — ABNORMAL HIGH (ref 65–99)
Glucose-Capillary: 177 mg/dL — ABNORMAL HIGH (ref 65–99)

## 2018-01-23 LAB — BASIC METABOLIC PANEL
Anion gap: 2 — ABNORMAL LOW (ref 5–15)
BUN: 31 mg/dL — AB (ref 6–20)
CHLORIDE: 106 mmol/L (ref 101–111)
CO2: 35 mmol/L — AB (ref 22–32)
CREATININE: 0.77 mg/dL (ref 0.61–1.24)
Calcium: 7.9 mg/dL — ABNORMAL LOW (ref 8.9–10.3)
GFR calc Af Amer: >60 mL/min (ref >60)
GFR calc non Af Amer: >60 mL/min (ref >60)
GLUCOSE: 141 mg/dL — AB (ref 65–99)
Potassium: 4.5 mmol/L (ref 3.5–5.1)
SODIUM: 143 mmol/L (ref 135–145)

## 2018-01-23 SURGERY — CREATION, TRACHEOSTOMY
Anesthesia: General

## 2018-01-23 MED ORDER — HYDROCHLOROTHIAZIDE 10 MG/ML ORAL SUSPENSION
25.0000 mg | Freq: Every day | ORAL | Status: DC
  Filled 2018-01-23: qty 2.5

## 2018-01-23 MED ORDER — PRO-STAT SUGAR FREE PO LIQD
30.0000 mL | Freq: Three times a day (TID) | ORAL | Status: DC
  Administered 2018-01-23 – 2018-01-28 (×15): 30 mL

## 2018-01-23 MED ORDER — HYDROCHLOROTHIAZIDE 25 MG PO TABS: 25.0000 mg | ORAL_TABLET | Freq: Every day | ORAL | Status: DC

## 2018-01-23 MED ORDER — ACETAZOLAMIDE SODIUM 500 MG IJ SOLR
500.0000 mg | Freq: Once | INTRAMUSCULAR | Status: AC
  Administered 2018-01-23: 500 mg via INTRAVENOUS
  Filled 2018-01-23: qty 500

## 2018-01-23 MED ORDER — DEXMEDETOMIDINE HCL IN NACL 200 MCG/50ML IV SOLN
INTRAVENOUS | Status: DC | PRN
  Administered 2018-01-23: 20 ug via INTRAVENOUS

## 2018-01-23 MED ORDER — MIDAZOLAM HCL 2 MG/2ML IJ SOLN
INTRAMUSCULAR | Status: AC
  Filled 2018-01-23: qty 2

## 2018-01-23 MED ORDER — PHENYLEPHRINE HCL 10 MG/ML IJ SOLN
INTRAMUSCULAR | Status: AC
  Filled 2018-01-23: qty 1

## 2018-01-23 MED ORDER — MIDAZOLAM HCL 2 MG/2ML IJ SOLN
INTRAMUSCULAR | Status: DC | PRN
  Administered 2018-01-23: 2 mg via INTRAVENOUS

## 2018-01-23 MED ORDER — PHENYLEPHRINE HCL 10 MG/ML IJ SOLN
INTRAMUSCULAR | Status: DC | PRN
  Administered 2018-01-23 (×4): 100 ug via INTRAVENOUS

## 2018-01-23 MED ORDER — CLONAZEPAM 0.5 MG PO TABS
0.5000 mg | ORAL_TABLET | Freq: Two times a day (BID) | ORAL | Status: DC
  Administered 2018-01-23 – 2018-02-03 (×23): 0.5 mg
  Filled 2018-01-23 (×23): qty 1

## 2018-01-23 MED ORDER — FENTANYL CITRATE (PF) 100 MCG/2ML IJ SOLN
INTRAMUSCULAR | Status: DC | PRN
  Administered 2018-01-23: 50 ug via INTRAVENOUS

## 2018-01-23 MED ORDER — ENOXAPARIN SODIUM 40 MG/0.4ML ~~LOC~~ SOLN
40.0000 mg | Freq: Two times a day (BID) | SUBCUTANEOUS | Status: DC
  Administered 2018-01-24 – 2018-01-26 (×6): 40 mg via SUBCUTANEOUS
  Filled 2018-01-23 (×7): qty 0.4

## 2018-01-23 MED ORDER — LIDOCAINE-EPINEPHRINE 1 %-1:100000 IJ SOLN
INTRAMUSCULAR | Status: AC
  Filled 2018-01-23: qty 1

## 2018-01-23 MED ORDER — LISINOPRIL 5 MG PO TABS
5.0000 mg | ORAL_TABLET | Freq: Every day | ORAL | Status: DC
  Administered 2018-01-24 – 2018-02-10 (×17): 5 mg
  Filled 2018-01-23 (×18): qty 1

## 2018-01-23 MED ORDER — ROCURONIUM BROMIDE 100 MG/10ML IV SOLN
INTRAVENOUS | Status: DC | PRN
  Administered 2018-01-23: 30 mg via INTRAVENOUS
  Administered 2018-01-23: 20 mg via INTRAVENOUS
  Administered 2018-01-23: 50 mg via INTRAVENOUS

## 2018-01-23 MED ORDER — ACETAMINOPHEN 325 MG PO TABS
650.0000 mg | ORAL_TABLET | ORAL | Status: DC | PRN
Start: 2018-01-23 — End: 2018-03-19
  Administered 2018-01-28 – 2018-03-14 (×8): 650 mg
  Filled 2018-01-23 (×8): qty 2

## 2018-01-23 MED ORDER — FENTANYL 100 MCG/HR TD PT72
100.0000 ug | MEDICATED_PATCH | TRANSDERMAL | Status: DC
  Administered 2018-01-23: 100 ug via TRANSDERMAL
  Filled 2018-01-23: qty 1

## 2018-01-23 MED ORDER — HYDROCHLOROTHIAZIDE 25 MG PO TABS
25.0000 mg | ORAL_TABLET | Freq: Every day | ORAL | Status: DC
  Administered 2018-01-23 – 2018-02-06 (×15): 25 mg via ORAL
  Filled 2018-01-23 (×15): qty 1

## 2018-01-23 MED ORDER — FENTANYL CITRATE (PF) 100 MCG/2ML IJ SOLN
25.0000 ug | INTRAMUSCULAR | Status: DC | PRN
  Administered 2018-01-24: 50 ug via INTRAVENOUS
  Filled 2018-01-23 (×2): qty 2

## 2018-01-23 MED ORDER — LORAZEPAM 2 MG/ML IJ SOLN
1.0000 mg | INTRAMUSCULAR | Status: DC | PRN
  Administered 2018-01-24 (×2): 1 mg via INTRAVENOUS
  Filled 2018-01-23 (×2): qty 1

## 2018-01-23 MED ORDER — FREE WATER
200.0000 mL | Freq: Three times a day (TID) | Status: DC
  Administered 2018-01-23 – 2018-02-10 (×50): 200 mL

## 2018-01-23 MED ORDER — VITAL 1.5 CAL PO LIQD
1000.0000 mL | ORAL | Status: DC
  Administered 2018-01-23 – 2018-01-27 (×5): 1000 mL

## 2018-01-23 MED ORDER — LIDOCAINE-EPINEPHRINE 1 %-1:100000 IJ SOLN
INTRAMUSCULAR | Status: DC | PRN
  Administered 2018-01-23: 8 mL

## 2018-01-23 MED ORDER — FENTANYL CITRATE (PF) 100 MCG/2ML IJ SOLN
INTRAMUSCULAR | Status: AC
  Filled 2018-01-23: qty 2

## 2018-01-23 MED ORDER — SODIUM CHLORIDE 0.9 % IV SOLN
INTRAVENOUS | Status: DC | PRN
  Administered 2018-01-23: 11:00:00 via INTRAVENOUS

## 2018-01-23 SURGICAL SUPPLY — 31 items
BLADE SURG 15 STRL LF DISP TIS (BLADE) ×1 IMPLANT
BLADE SURG 15 STRL SS (BLADE) ×2
BLADE SURG SZ11 CARB STEEL (BLADE) ×3 IMPLANT
CANISTER SUCT 1200ML W/VALVE (MISCELLANEOUS) ×3 IMPLANT
CORD BIP STRL DISP 12FT (MISCELLANEOUS) ×3 IMPLANT
ELECT REM PT RETURN 9FT ADLT (ELECTROSURGICAL) ×3
ELECTRODE REM PT RTRN 9FT ADLT (ELECTROSURGICAL) ×1 IMPLANT
FORCEPS JEWEL BIP 4-3/4 STR (INSTRUMENTS) ×3 IMPLANT
GAUZE PACKING IODOFORM 1/2 (PACKING) IMPLANT
GLOVE BIO SURGEON STRL SZ7.5 (GLOVE) ×3 IMPLANT
GOWN STRL REUS W/ TWL LRG LVL3 (GOWN DISPOSABLE) ×2 IMPLANT
GOWN STRL REUS W/TWL LRG LVL3 (GOWN DISPOSABLE) ×4
HEMOSTAT SURGICEL 2X3 (HEMOSTASIS) IMPLANT
HLDR TRACH TUBE NECKBAND 18 (MISCELLANEOUS) ×1 IMPLANT
HOLDER TRACH TUBE NECKBAND 18 (MISCELLANEOUS) ×2
KIT TURNOVER KIT A (KITS) ×3 IMPLANT
LABEL OR SOLS (LABEL) IMPLANT
NS IRRIG 500ML POUR BTL (IV SOLUTION) ×3 IMPLANT
PACK HEAD/NECK (MISCELLANEOUS) ×3 IMPLANT
SHEARS HARMONIC 9CM CVD (BLADE) ×3 IMPLANT
SPONGE DRAIN TRACH 4X4 STRL 2S (GAUZE/BANDAGES/DRESSINGS) ×3 IMPLANT
SPONGE KITTNER 5P (MISCELLANEOUS) ×3 IMPLANT
SUCTION FRAZIER HANDLE 10FR (MISCELLANEOUS) ×2
SUCTION TUBE FRAZIER 10FR DISP (MISCELLANEOUS) ×1 IMPLANT
SUT SILK 2 0 (SUTURE) ×2
SUT SILK 2 0 SH (SUTURE) ×6 IMPLANT
SUT SILK 2-0 18XBRD TIE 12 (SUTURE) ×1 IMPLANT
SUT VIC AB 3-0 PS2 18 (SUTURE) ×3 IMPLANT
SYR 3ML LL SCALE MARK (SYRINGE) ×3 IMPLANT
TUBE TRACH SHILEY  6 DIST  CUF (TUBING) IMPLANT
TUBE TRACH SHILEY 8 DIST CUF (TUBING) ×3 IMPLANT

## 2018-01-23 NOTE — Transfer of Care (Signed)
Immediate Anesthesia Transfer of Care Note  Patient: Tommy Mathews  Procedure(s) Performed: TRACHEOSTOMY (N/A )  Patient Location: PACU  Anesthesia Type:General  Level of Consciousness: sedated and Patient remains intubated per anesthesia plan  Airway & Oxygen Therapy: Patient placed on Ventilator (see vital sign flow sheet for setting)  Post-op Assessment: Report given to RN and Post -op Vital signs reviewed and stable  Post vital signs: Reviewed and stable  Last Vitals:  Vitals Value Taken Time  BP 95/52 01/23/2018 11:47 AM  Temp 36.2 C 01/23/2018 11:47 AM  Pulse 74 01/23/2018 11:47 AM  Resp 8 01/23/2018 11:47 AM  SpO2 94 % 01/23/2018 11:47 AM    Last Pain:  Vitals:   01/22/18 2000  TempSrc: Core  PainSc:          Complications: No apparent anesthesia complications

## 2018-01-23 NOTE — Progress Notes (Signed)
[facility] - [place] at [facility]   PATIENT NAME: Tommy Mathews    MR#:  [id number]  DATE OF BIRTH:  06-18-1961  SUBJECTIVE:   S/p Trach today.  No other acute events overnight.  Family at bedside.   REVIEW OF SYSTEMS:    Review of Systems  Unable to perform ROS: Intubated    Nutrition: Tube feeds Tolerating Diet: Yes Tolerating PT: sedated & intubated.    DRUG ALLERGIES:  No Known Allergies  VITALS:  Blood pressure 124/79, pulse 85, temperature 97.7 F (36.5 C), resp. rate (!) 6, height 5\' 10"  (1.778 m), weight (!) 139.5 kg (307 lb 8.7 oz), SpO2 96 %.  PHYSICAL EXAMINATION:   Physical Exam  GENERAL:  57 y.o.-year-old critically ill patient lying in bed sedated and s/p Trach and on vent.  EYES: Pupils equal, round, reactive to light. No scleral icterus.  HEENT: Head atraumatic, normocephalic. S/p Trach and on vent.    NECK:  Supple, no jugular venous distention. No thyroid enlargement, no tenderness.  LUNGS: Poor Resp. effort, no wheezing, rales, rhonchi. No use of accessory muscles of respiration.  CARDIOVASCULAR: S1, S2 normal. No murmurs, rubs, or gallops.  ABDOMEN: Soft, nontender, nondistended. Bowel sounds present. No organomegaly or mass.  PEG tube in place.   EXTREMITIES: No cyanosis, clubbing, +1-2 edema b/l and anasarca.  NEUROLOGIC: Sedated & intubated  PSYCHIATRIC: Sedated & intubated.   SKIN: No obvious rash, lesion, or ulcer.   GU - + Scrotal Edema  LABORATORY PANEL:   CBC Recent Labs  Lab 01/23/18 0422  WBC 15.0*  HGB 9.4*  HCT 29.7*  PLT 169   ------------------------------------------------------------------------------------------------------------------  Chemistries  Recent Labs  Lab 01/20/18 0411 01/21/18 0500  01/23/18 0422  NA 146* 147*   < > 143  K 3.6 3.3*   < > 4.5  CL 108 110   < > 106  CO2 30 33*   < > 35*  GLUCOSE 334* 193*   < > 141*  BUN 52* 51*   < > 31*  CREATININE 1.05 1.04   < > 0.77  CALCIUM  7.5* 7.4*   < > 7.9*  MG 1.7  --   --   --   AST 38 30  --   --   ALT 39 30  --   --   ALKPHOS 125 96  --   --   BILITOT 0.3 0.5  --   --    < > = values in this interval not displayed.   ------------------------------------------------------------------------------------------------------------------  Cardiac Enzymes No results for input(s): TROPONINI in the last 168 hours. ------------------------------------------------------------------------------------------------------------------  RADIOLOGY:  Dg Chest Port 1 View  Result Date: 01/23/2018 CLINICAL DATA:  Tracheostomy placement EXAM: PORTABLE CHEST 1 VIEW COMPARISON:  January 21, 2018 FINDINGS: Tracheostomy catheter tip is 4.1 cm above the carina. Central catheter tip is at the cavoatrial junction. No pneumothorax. There is persistent moderate interstitial and patchy alveolar edema with small pleural effusions bilaterally. Heart is mildly enlarged with pulmonary venous hypertension. No adenopathy. No bone lesions. IMPRESSION: Tube and catheter positions as described without pneumothorax. Evidence of a degree of congestive heart failure, similar to most recent prior study. Electronically Signed   By: [name].   On: 01/23/2018 12:06     ASSESSMENT AND PLAN:   57 yo hx of HTN, Hyperlipidemia, GERD, Diabetes, who presented to the hospital for shortness of breath and noted to be in septic shock secondary to pneumonia.  1. acute severe septic shock - Patient is now off vasopressors.  Patient has finished treatment of underlying pneumonia UTI with IV antibiotics.  2. acutepneumonia/UTI -Patient has finished treatment with IV antibiotics for the pneumonia and UTI.  3.GI bleed  - Hg. Stable.   - continuePPI  4. acute respiratory failure with hypoxia-this was secondary to pneumonia.  Patient was difficult to wean off the ventilator. - s/p Trach now and cont. Further care as Per Pulmonary/Intensivist.  -Continue  Pulmicort nebs, duo nebs.  5.  Atrial fibrillation with rapid ventricular response- now in normal sinus rhythm - Cont. Oral Amio, Metoprolol.   6.AKI-resolved and creatinine at baseline.  7. chronic diabetes mellitus type 2 - cont. SSI and follow BS  8. Depression - cont. Prozac/Depakote.   9.  Nutrition- patient is status post PEG tube placement today.  Cont. tube feeds and tolerating it well.   10. Generalized Anasarca/swelling - cont. Lasix PRN.    Likely LTAC placement.    All the records are reviewed and case discussed with Care Management/Social Worker. Management plans discussed with the patient, family and they are in agreement.  CODE STATUS: Full code  DVT Prophylaxis: Lovenox  TOTAL TIME TAKING CARE OF THIS PATIENT: 25 minutes.   POSSIBLE D/C unclear DEPENDING ON CLINICAL CONDITION.   [name],[name] on 01/23/2018 at 2:32 PM  Between 7am to 6pm - Pager - [phone]  After 6pm go to [email or web address] - Social research officer, governmentpassword EPAS [email or web address]  [facility] [place] [facility]  Office  [phone]  CC: Primary care physician; Patient, [name]

## 2018-01-23 NOTE — Anesthesia Procedure Notes (Addendum)
Performed by: Camille BalSandstrom, Addison Freimuth O, CRNA Pre-anesthesia Checklist: Patient identified, Emergency Drugs available, Suction available, Patient being monitored and Timeout performed Tube size: 7.5 mm Secured at: 21 cm Tube secured with: Tape Comments: Preexisting ETT.

## 2018-01-23 NOTE — Anesthesia Postprocedure Evaluation (Signed)
Anesthesia Post Note  Patient: Tommy Mathews  Procedure(s) Performed: TRACHEOSTOMY (N/A )  Patient location during evaluation: SICU Anesthesia Type: General Level of consciousness: awake Pain management: pain level controlled Vital Signs Assessment: post-procedure vital signs reviewed and stable Respiratory status: patient on ventilator - see flowsheet for VS [name] Cardiovascular status: stable Postop Assessment: no apparent nausea or vomiting Anesthetic complications: no     Last Vitals:  Vitals:   01/23/18 0846 01/23/18 1147  BP: 140/82 (!) 95/52  Pulse: 86 74  Resp:  (!) 8  Temp:  (!) 36.2 C  SpO2:  94%    Last Pain:  Vitals:   01/22/18 2000  TempSrc: Core  PainSc:                  [name]

## 2018-01-23 NOTE — Anesthesia Post-op Follow-up Note (Signed)
Anesthesia QCDR form completed.        

## 2018-01-23 NOTE — Consult Note (Signed)
Size 8 cuffed Shiley tracheostomy tube placed.  Significant scar of anterior trachea noted from prior tracheostomy.  OK to remove Betadine soaked gauze on 4/26.  Ok to remove sutures on POD#5.

## 2018-01-23 NOTE — Progress Notes (Signed)
Nutrition Follow-up  DOCUMENTATION CODES:   Obesity unspecified  INTERVENTION:  Initiate new goal regimen of Vital 1.5 Cal at 45 mL/hr (1080 mL goal daily volume) + Pro-Stat 30 mL TID via G-tube. Provides 1920 kcal, 118 grams of protein, 821 mL H2O daily.  With free water flush of 200 mL Q8hrs patient is receiving 1420 mL H2O daily including water in tube feeding.  Continue liquid MVI daily per tube.  NUTRITION DIAGNOSIS:   Inadequate oral intake related to inability to eat as evidenced by NPO status.  Ongoing - addressing with TF protocol.  GOAL:   Provide needs based on ASPEN/SCCM guidelines  Met with TF protocol.  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   Patient intubated and sedated. Going for tracheostomy tube placement today.  Access: 20 Fr G-tube with C-clamp placed 4/23; placement verified by relook endoscopy; 4.5 cm at external bumper  MAP: 82-102 mmHg  TF: pt had been tolerating Vital High Protein at 30 mL/hr + Pro-Stat 60 mL BID  Patient is currently intubated on ventilator support Ve: 5.6 L/min Temp (24hrs), Avg:97.7 F (36.5 C), Min:97.2 F (36.2 C), Max:98.1 F (36.7 C)  Propofol: N/A  Medications reviewed and include: free water flush 200 mL Q8hrs, Novolog 0-20 units Q4hrs, Lantus 20 units QHS, liquid MVI daily per tube, pantoprazole, senna-docusate, D5W with potassium chloride 40 mEq at 50 mL/hr (60 grams dextrose, 204 kcal daily), fentanyl gtt. Patient is off of propofol.  Labs  reviewed: CBG 105-147 past 24 hrs, CO2 35, BUN 31.  I/O: 2220 mL UOP yesterday (0.7 mL/kg/hr)  No subsequent weights to trend since 4/22. At that time patient was 139.5 kg and had gained 32.1 kg since admission.  Discussed with RN and on rounds.  Diet Order:  No diet orders on file  EDUCATION NEEDS:   Not appropriate for education at this time  Skin:  Skin Assessment: Skin Integrity Issues: Skin Integrity Issues:: Stage II, Other (Comment) Stage II: coccyx Other: open blister right thigh; MSAD to bilateral thighs  Last BM:  01/14/2018 - no BM characteristics documented  Height:   Ht Readings from Last 1 Encounters:  01/07/18 '5\' 10"'  (1.778 m)    Weight:   Wt Readings from Last 1 Encounters:  01/20/18 (!) 307 lb 8.7 oz (139.5 kg)    Ideal Body Weight:  75.5 kg  BMI:  Body mass index is 44.13 kg/m.  Estimated Nutritional Needs:   Kcal:  1949 (PSU 2003b w/ MSJ 1919, Ve 5.6, Tmax 36.8)  Protein:  113-135 grams (1.5-1.8 grams/kg IBW)  Fluid:  1.8 L/day  Willey Blade, MS, RD, LDN Office: [phone] Pager: [phone] After Hours/Weekend Pager: [phone]

## 2018-01-23 NOTE — Op Note (Signed)
..  01/23/2018 11:25 AM  Tommy Mathews,  Tommy Mathews [id number]  Pre-Op Dx: Respiratory Failure  Post-Op Dx:  same  Proc:  Tracheostomy  Surg:  Josias Tomerlin  Anes:  GOT  EBL:  <45ml  Comp:  none  Findings:  Significant scar of anterior tracheal wall.  Anterior portion of ring removed as it was over 7mm thick.  Size 8 cuffed shiley placed.  Procedure:  The patient was brought from the intensive care unit to the operating room and transferred to an operating table.  Anesthesia was administered per indwelling orotracheal tube.   Neck extension was achieved as possible anda shoulder rolke was placed.  The lower neck was palpated with the findings as described above.  1% Xylocaine with 1:100,000 epinephrine, 8 cc's, was infiltrated into the surgical field for intraoperative hemostasis.  Several minutes were allowed for this to take effect. The patient was prepped in a sterile fashion with a surgical prep from the chin down to the upper chest.  Sterile draping was accomplished in the standard fashion.  A  5 cm horizontal incision was made sharply a finger's breadth above the sternal notch, and extended through skin and subcutaneous fat.  Using cautery, the superficial layer of the deep cervical fascia was lysed.  Additional dissection revealed the strap muscles.  Significant anterior neck scar was present throughout.  The thryoid had been previously divided but had scared down to the right tracheal wall.  This was meticulously peeled away.  A large anterior tracheal scar was noted at previous tracheostomy site.  The pretracheal plane was visualized.  This was entered bluntly.  The thyroid gland was retracted to either side.  The anterior face of the trachea was cleared.  In the previous scar a transverse incision was made between cartilage rings into the tracheal lumen.  The tracheal scar was noted to be significantly thickened.  A curved scissor was used to make inferior cuts and this scar was noted to be  immobile and so was removed.  A previously tested  # 8 Shiley cuffed tracheostomy tube was brought into the field.  With the endotracheal tube under direct visualization through the tracheostomy, it was gently backed up.  The tracheostomy tube was inserted into the tracheal lumen.  Hemostasis was observed. The cuff was inflated and observed to be intact and containing pressure. The inner cannula was placed and ventilation assumed per tracheostomy tube.  Good chest wall motion was observed, and CO2 was documented per anesthesia.  The trach tube was secured in the standard fashion with trach ties. A 2-0 Nylon suture was used to secure the trach tube to the skin on both sides.  Hemostasis was observed again.  When satisfactory ventilation was assured, the orotracheal tube was removed.  At this point the procedure was completed.  The patient was returned to anesthesia, awakened as possible, and transferred back to the intensive care unit in stable condition.  Comment: 57 y.o. male with prolonged ventilation was the indication for today's procedure.  Anticipate a routine postoperative recovery including standard tracheal hygiene.  The sutures should be removed in 5 days.  When the patient no longer requires ventilator or pressure support, the cuff should be deflated.  Changing to an uncuffed tube and downsizing will be according to the clinical condition of the patient.   Sandrea Boer  11:25 AM 01/23/2018

## 2018-01-23 NOTE — Progress Notes (Signed)
PULMONARY / CRITICAL CARE MEDICINE   Name: Tommy PangJames Mathews MRN: [id number] DOB: [birth year]    ADMISSION DATE:  01/06/2018   PT PROFILE:  [age] M SNF resident due to psychiatric illness and history of TBI admitted with acute hypoxemic respiratory failure, severe bilateral pulmonary infiltrates, severe sepsis/septic shock.  Intubated in the ED shortly after arrival.  Was also noted to have dark coffee-ground material suctioned from stomach.  Patient chronically on rivaroxaban.   MAJOR EVENTS/TEST RESULTS: 04/08 Admission as above 04/08 CT chest: Multifocal severe pneumonia with RUL collapse and soft tissue obstructing RUL bronchus.  04/08 CTAP: Mild small bowel ileus, potential enteritis. Nasogastric tube terminates in distal stomach. Cholelithiasis without CT findings of acute cholecystitis.Consider bronchoscopy.  04/09 nephrology consultation: No acute indication for dialysis at present. 04/09 gastroenterology consultation: Recommend high-dose PPI therapy. 04/10 severely hypoxemic despite 100% FiO2.  Recruitment maneuver with significant improvement.  PEEP increased and ARDS strategy on ventilator implemented.  Neuromuscular blockade implemented due to ventilator dyssynchrony. 04/11 PAF with RVR > converted with amiodarone. Gas exchange improving. Off Nimbex 04/12 Echocardiogram: EF 20-25%.  Diffuse HK. 04/13 High airway pressure with vent dyssynchony; given Vecuronium; FIO2 increased to 50%  04/16 Cardiology consultation 04/13 Amiodarone D/C because of SB 04/15 increased HR, EF 20% afib with RVR 04/17 failed weaning trials, HR remains elevated 04/19 ENT consultation for trach tube placement 04/20 no significant changes in the last 24 hours. Has not tolerated any weaning  04/23 PEG placed. 04/23 intermittent ventilator dyssynchrony - sedation regimen adjusted 04/24 Somewhat more responsive. Not F/C 04/25 Trach tube placed 04/25 significantly more responsive.  Still not F/C  INDWELLING  DEVICES: L femoral CVL 04/08 >> 4/16 ETT 04/08 >> 04/25 RUE PICC 4/15 >>  PEG 04/23 >>  Trach tube 04/25 >>   MICRO DATA: MRSA PCR 04/08, 04/20 >> POS Urine 04/08 >> 10k coag negative staph Resp 04/09 >> c/w NOF Blood 04/08 >> NEG Blood 04/09 >> NEG Resp 04/20 >> c/w NOF  ANTIMICROBIALS:  Unasyn 04/08 >> 04/10 Vanc 04/08 >> 4/15 Cefepime 04/10 >> 4/15 Metronidazole 04/10 >> 4/15   SUBJECTIVE:  Status post tracheostomy tube today.  More responsive.  Eyes open spontaneously.  Not F/C.  Synchronous with ventilator.   VITAL SIGNS: BP 124/79   Pulse 85   Temp 97.7 F (36.5 C)   Resp (!) 6   Ht 5\' 10"  (1.778 m)   Wt (!) 307 lb 8.7 oz (139.5 kg)   SpO2 96%   BMI 44.13 kg/m    VENTILATOR SETTINGS: Vent Mode: PRVC FiO2 (%):  [35 %-100 %] 40 % Set Rate:  [14 bmp] 14 bmp Vt Set:  [500 mL] 500 mL PEEP:  [5 cmH20] 5 cmH20  INTAKE / OUTPUT: I/O last 3 completed shifts: In: 5331.4 [I.V.:4617.4; NG/GT:714] Out: 3270 [Urine:3270]  PHYSICAL EXAMINATION: General: RASS 0, -1, not F/C Neuro: Cranial nerves intact, moves all extremities  HEENT: NCAT, sclerae white Cardiovascular: Regular, no M Lungs: Clear anteriorly Abdomen: Nontender, + BS GU: Severe scrotal edema without significant change Extremities: Continued improvement in BUE edema, symmetric BLE edema  LABS:  BMET Recent Labs  Lab 01/21/18 0500 01/22/18 0407 01/23/18 0422  NA 147* 147* 143  K 3.3* 4.4 4.5  CL 110 107 106  CO2 33* 37* 35*  BUN 51* 39* 31*  CREATININE 1.04 0.79 0.77  GLUCOSE 193* 154* 141*    Electrolytes Recent Labs  Lab 01/17/18 0425 01/18/18 0526  01/20/18 0411 01/21/18 0500 01/22/18 0407 01/23/18  0422  CALCIUM 6.9* 7.7*   < > 7.5* 7.4* 7.7* 7.9*  MG 1.6* 1.8  --  1.7  --   --   --   PHOS 2.8 3.3  --  3.6  --   --   --    < > = values in this interval not displayed.    CBC Recent Labs  Lab 01/21/18 0500 01/22/18 0407 01/23/18 0422  WBC 13.5* 16.5* 15.0*  HGB 8.3*  9.2* 9.4*  HCT 23.8* 29.2* 29.7*  PLT 155 189 169    Coag's Recent Labs  Lab 01/20/18 0411  INR 1.04    Sepsis Markers Recent Labs  Lab 01/18/18 0526 01/19/18 0347 01/20/18 0411  PROCALCITON 0.27 0.22 0.43    ABG Recent Labs  Lab 01/17/18 0501 01/18/18 0626 01/18/18 1751  PHART 7.41 7.49* 7.43  PCO2ART 59* 48 55*  PO2ART 150* 71* 132*    Liver Enzymes Recent Labs  Lab 01/18/18 0526 01/20/18 0411 01/21/18 0500  AST 22 38 30  ALT 20 39 30  ALKPHOS 74 125 96  BILITOT 0.3 0.3 0.5  ALBUMIN 1.7* 1.7* 1.7*    Cardiac Enzymes No results for input(s): TROPONINI, PROBNP in the last 168 hours.  Glucose Recent Labs  Lab 01/22/18 1910 01/22/18 2352 01/23/18 0356 01/23/18 0741 01/23/18 1150 01/23/18 1601  GLUCAP 134* 147* 124* 105* 119* 132*    CXR: Tracheostomy tube well-positioned, persistent edema versus acute lung injury pattern    ASSESSMENT / PLAN: PULMONARY A: Acute hypoxemic respiratory failure Aspiration pneumonia ARDS, resolving Pulmonary edema Prolonged mechanical ventilation Tracheostomy tube status P:   Cont vent support - settings reviewed and/or adjusted Wean in PSV mode > ATC as tolerated Cont vent bundle Daily SBT if/when meets criteria  Continue nebulized steroids and bronchodilators  CARDIOVASCULAR A: septic shock, resolved Paroxysmal atrial fibrillation Dilated cardiomyopathy, etiology unclear Hypertension Intermittent sinus tachycardia P:  MAP goal > 65 mmHg Continue amiodarone  Resume hydrochlorothiazide at reduced dose Resume lisinopril Continue scheduled metoprolol Continue hydralazine as needed to maintain SBP < 160 mmHg  RENAL A:   AKI, resolved Hypernatremia, resolved Severe hypervolemia/anasarca Metabolic alkalosis due to loop diuresis P: Monitor BMET intermittently Monitor I/Os Correct electrolytes as indicated  Continue free water per G-tube Repeat acetazolamide x 1 dose  04/25  GASTROINTESTINAL A:   Abdominal distention, resolved Suspected UGIB, resolved G tube status P:   SUP: Enteral PPI Continue TF protocol  HEMATOLOGIC A: ICU acquired anemia No current evidence of acute blood loss P: DVT px: enoxaparin (resume 4/26) Monitor CBC intermittently Transfuse per usual guidelines   INFECTIOUS A:   Severe sepsis with shock, resolved Aspiration pneumonia, treated P:   Monitor temp, WBC count Micro and abx as above   ENDOCRINE A:   Type 2 diabetes, controlled P:   Continue Lantus (initiated 04/21) Continue resistant scale SSI  NEUROLOGIC A:   Ventilator dyssynchrony, improved ICU/ventilator associated discomfort History of schizophrenia History of TBI P:   RASS goal: 0, -1 Discontinue fentanyl infusion 4/25 Duragesic patch initiated 4/25 Continue quetiapine (initiated 4/24) Initiate low-dose clonazepam 4/25 Continue Lorazepam and intermittent fentanyl as needed   Sister updated in detail at bedside  CCM time:  The above time includes time spent in consultation with patient and/or family members and reviewing care plan on multidisciplinary rounds  Billy Fischer, MD PCCM service Mobile [phone] Pager [phone] 01/23/2018 5:10 PM

## 2018-01-23 NOTE — Anesthesia Preprocedure Evaluation (Signed)
Anesthesia Evaluation  Patient identified by MRN, date of birth, ID band Patient awake and Patient confused    Reviewed: Allergy & Precautions, H&P , NPO status , Patient's Chart, lab work & pertinent test results  History of Anesthesia Complications Negative for: history of anesthetic complications  Airway Mallampati: Intubated       Dental  (+) Chipped, Poor Dentition   Pulmonary neg pulmonary ROS, shortness of breath, pneumonia,           Cardiovascular hypertension,      Neuro/Psych PSYCHIATRIC DISORDERS negative neurological ROS     GI/Hepatic negative GI ROS, GERD  ,  Endo/Other  negative endocrine ROSdiabetes, Type 2  Renal/GU Renal disease     Musculoskeletal   Abdominal   Peds  Hematology negative hematology ROS (+)   Anesthesia Other Findings Past Medical History: No date: Diabetes (HCC) No date: GERD (gastroesophageal reflux disease) No date: HLD (hyperlipidemia) No date: HTN (hypertension)  Past Surgical History: 01/21/2018: PEG PLACEMENT; N/A     Comment:  Procedure: PERCUTANEOUS ENDOSCOPIC GASTROSTOMY (PEG)               PLACEMENT;  Surgeon: Wyline MoodAnna, Kiran, MD;  Location: [facility]               ENDOSCOPY;  Service: Gastroenterology;  Laterality: N/A;  BMI    Body Mass Index:  44.13 kg/m      Reproductive/Obstetrics negative OB ROS                             Anesthesia Physical Anesthesia Plan  ASA: IV  Anesthesia Plan: General ETT   Post-op Pain Management:    Induction: Intravenous  PONV Risk Score and Plan:   Airway Management Planned: Oral ETT  Additional Equipment:   Intra-op Plan:   Post-operative Plan: Post-operative intubation/ventilation  Informed Consent: I have reviewed the patients History and Physical, chart, labs and discussed the procedure including the risks, benefits and alternatives for the proposed anesthesia with the patient or  authorized representative who has indicated his/her understanding and acceptance.   Dental Advisory Given  Plan Discussed with: Anesthesiologist, CRNA and Surgeon  Anesthesia Plan Comments: (Consent from mother via phone at [phone]  Mother consented for risks of anesthesia including but not limited to:  - adverse reactions to medications - damage to teeth, lips or other oral mucosa - sore throat or hoarseness - Damage to heart, brain, lungs or loss of life  Mother voiced understanding.)        Anesthesia Quick Evaluation

## 2018-01-24 ENCOUNTER — Inpatient Hospital Stay: Payer: Medicaid Other

## 2018-01-24 ENCOUNTER — Encounter: Payer: Self-pay | Admitting: Otolaryngology

## 2018-01-24 LAB — GLUCOSE, CAPILLARY
GLUCOSE-CAPILLARY: 118 mg/dL — AB (ref 65–99)
GLUCOSE-CAPILLARY: 164 mg/dL — AB (ref 65–99)
GLUCOSE-CAPILLARY: 147 mg/dL — AB (ref 65–99)
Glucose-Capillary: 135 mg/dL — ABNORMAL HIGH (ref 65–99)
Glucose-Capillary: 138 mg/dL — ABNORMAL HIGH (ref 65–99)
Glucose-Capillary: 147 mg/dL — ABNORMAL HIGH (ref 65–99)

## 2018-01-24 LAB — BASIC METABOLIC PANEL
ANION GAP: 2 — AB (ref 5–15)
BUN: 28 mg/dL — AB (ref 6–20)
CO2: 35 mmol/L — ABNORMAL HIGH (ref 22–32)
Calcium: 8 mg/dL — ABNORMAL LOW (ref 8.9–10.3)
Chloride: 105 mmol/L (ref 101–111)
Creatinine, Ser: 0.8 mg/dL (ref 0.61–1.24)
GFR calc Af Amer: >60 mL/min (ref >60)
GLUCOSE: 139 mg/dL — AB (ref 65–99)
POTASSIUM: 4 mmol/L (ref 3.5–5.1)
Sodium: 142 mmol/L (ref 135–145)

## 2018-01-24 MED ORDER — FENTANYL CITRATE (PF) 100 MCG/2ML IJ SOLN
25.0000 ug | INTRAMUSCULAR | Status: DC | PRN
  Administered 2018-01-24 – 2018-01-25 (×3): 50 ug via INTRAVENOUS
  Filled 2018-01-24 (×3): qty 2

## 2018-01-24 MED ORDER — DEXMEDETOMIDINE HCL IN NACL 400 MCG/100ML IV SOLN
0.4000 ug/kg/h | INTRAVENOUS | Status: DC
  Administered 2018-01-24: 1.2 ug/kg/h via INTRAVENOUS
  Administered 2018-01-24: 0.4 ug/kg/h via INTRAVENOUS
  Administered 2018-01-24: 0.6 ug/kg/h via INTRAVENOUS
  Administered 2018-01-24: 0.4 ug/kg/h via INTRAVENOUS
  Administered 2018-01-24: 0.6 ug/kg/h via INTRAVENOUS
  Administered 2018-01-25 (×5): 0.8 ug/kg/h via INTRAVENOUS
  Administered 2018-01-26: 1 ug/kg/h via INTRAVENOUS
  Administered 2018-01-26: 0.8 ug/kg/h via INTRAVENOUS
  Administered 2018-01-26: 1 ug/kg/h via INTRAVENOUS
  Filled 2018-01-24 (×17): qty 100

## 2018-01-24 NOTE — Progress Notes (Signed)
[facility] [place] [facility]   PATIENT NAME: Tommy Mathews    MR#:  [id number]  DATE OF BIRTH:  [birth year]  SUBJECTIVE:   No other acute events overnight.  Patient is status post tracheostomy from yesterday.  Family at bedside.  Awaiting LTAC placement.  REVIEW OF SYSTEMS:    Review of Systems  Unable to perform ROS: Intubated    Nutrition: Tube feeds Tolerating Diet: Yes Tolerating PT: sedated & intubated.    DRUG ALLERGIES:  No Known Allergies  VITALS:  Blood pressure 120/66, pulse 66, temperature (!) 97.3 F (36.3 C), resp. rate (!) 21, height 5\' 10"  (1.778 m), weight (!) 139.5 kg (307 lb 8.7 oz), SpO2 94 %.  PHYSICAL EXAMINATION:   Physical Exam  GENERAL:  57 y.o.-year-old critically ill patient lying in bed sedated and s/p Trach and on vent.  EYES: Pupils equal, round, reactive to light. No scleral icterus.  HEENT: Head atraumatic, normocephalic. S/p Trach and on vent.    NECK:  Supple, no jugular venous distention. No thyroid enlargement, no tenderness.  LUNGS: Poor Resp. effort, no wheezing, rales, rhonchi. No use of accessory muscles of respiration.  CARDIOVASCULAR: S1, S2 normal. No murmurs, rubs, or gallops.  ABDOMEN: Soft, nontender, nondistended. Bowel sounds present. No organomegaly or mass.  PEG tube in place.   EXTREMITIES: No cyanosis, clubbing, +1-2 edema b/l and anasarca.  NEUROLOGIC: Sedated & intubated  PSYCHIATRIC: Sedated & intubated.   SKIN: No obvious rash, lesion, or ulcer.   GU - + Scrotal Edema  LABORATORY PANEL:   CBC Recent Labs  Lab 01/23/18 0422  WBC 15.0*  HGB 9.4*  HCT 29.7*  PLT 169   ------------------------------------------------------------------------------------------------------------------  Chemistries  Recent Labs  Lab 01/20/18 0411 01/21/18 0500  01/24/18 0443  NA 146* 147*   < > 142  K 3.6 3.3*   < > 4.0  CL 108 110   < > 105  CO2 30 33*   < > 35*  GLUCOSE 334* 193*   < > 139*   BUN 52* 51*   < > 28*  CREATININE 1.05 1.04   < > 0.80  CALCIUM 7.5* 7.4*   < > 8.0*  MG 1.7  --   --   --   AST 38 30  --   --   ALT 39 30  --   --   ALKPHOS 125 96  --   --   BILITOT 0.3 0.5  --   --    < > = values in this interval not displayed.   ------------------------------------------------------------------------------------------------------------------  Cardiac Enzymes No results for input(s): TROPONINI in the last 168 hours. ------------------------------------------------------------------------------------------------------------------  RADIOLOGY:  Dg Chest Port 1 View  Result Date: 01/23/2018 CLINICAL DATA:  Tracheostomy placement EXAM: PORTABLE CHEST 1 VIEW COMPARISON:  January 21, 2018 FINDINGS: Tracheostomy catheter tip is 4.1 cm above the carina. Central catheter tip is at the cavoatrial junction. No pneumothorax. There is persistent moderate interstitial and patchy alveolar edema with small pleural effusions bilaterally. Heart is mildly enlarged with pulmonary venous hypertension. No adenopathy. No bone lesions. IMPRESSION: Tube and catheter positions as described without pneumothorax. Evidence of a degree of congestive heart failure, similar to most recent prior study. Electronically Signed   By: Bretta BangWilliam  Woodruff III M.D.   On: 01/23/2018 12:06     ASSESSMENT AND PLAN:   57 yo hx of HTN, Hyperlipidemia, GERD, Diabetes, who presented to the hospital for shortness of breath and noted to  be in septic shock secondary to pneumonia.  1. acute severe septic shock - Patient is now off vasopressors.  Patient has finished treatment of underlying pneumonia UTI with IV antibiotics.  2. acutepneumonia/UTI -Patient has finished treatment with IV antibiotics for the pneumonia and UTI.  3.GI bleed  - Hg. Stable.   - continuePPI  4. acute respiratory failure with hypoxia-this was secondary to pneumonia.  Patient was difficult to wean off the ventilator. - s/p Trach  now and cont. Further care as Per Pulmonary/Intensivist.  -Continue Pulmicort nebs, duo nebs.  5.  Atrial fibrillation with rapid ventricular response- now in normal sinus rhythm - Cont. Oral Amio, Metoprolol.   6.AKI-resolved and creatinine at baseline.  7. chronic diabetes mellitus type 2 - cont. SSI and follow BS  8. Depression - cont. Prozac/Depakote.   9.  Nutrition- patient is status post PEG tube placement.  Cont. tube feeds and tolerating it well.   10. Generalized Anasarca/swelling - cont. Lasix PRN.    Likely LTAC placement as per ICU.    All the records are reviewed and case discussed with Care Management/Social Worker. Management plans discussed with the patient, family and they are in agreement.  CODE STATUS: Full code  DVT Prophylaxis: Lovenox  TOTAL TIME TAKING CARE OF THIS PATIENT: 25 minutes.   POSSIBLE D/C unclear DEPENDING ON CLINICAL CONDITION.   Houston Siren M.D on 01/24/2018 at 2:55 PM  Between 7am to 6pm - Pager - [phone]  After 6pm go to [email or web address] - Social research officer, government  [facility] [facility]  Office  [phone]  CC: Primary care physician; Patient, [name]

## 2018-01-24 NOTE — Progress Notes (Signed)
PT Cancellation Note  Patient Details Name: Tommy Mathews MRN: [id number] DOB: [birth year]   Cancelled Treatment:    Reason Eval/Treat Not Completed: Fatigue/lethargy limiting ability to participate(Consult received and chart reviewed.  Patient remains on precedex; opens eyes briefly to sternal rub, but unable to maintain alertness or follow commands for active participation with treatment session.  Will continue efforts next date as appropriate. Will continue efforts next date as medically appropriate.)  Danylle Ouk H. Manson PasseyBrown, PT, DPT, NCS 01/24/18, 4:38 PM [phone]

## 2018-01-24 NOTE — Progress Notes (Signed)
PULMONARY / CRITICAL CARE MEDICINE   Name: Tommy Mathews MRN: [id number] DOB: 09/04/1961    ADMISSION DATE:  01/06/2018   PT PROFILE:  [age] M SNF resident due to psychiatric illness and history of TBI admitted with acute hypoxemic respiratory failure, severe bilateral pulmonary infiltrates, severe sepsis/septic shock.  Intubated in the ED shortly after arrival.  Was also noted to have dark coffee-ground material suctioned from stomach.  Patient chronically on rivaroxaban.   MAJOR EVENTS/TEST RESULTS: 04/08 Admission as above 04/08 CT chest: Multifocal severe pneumonia with RUL collapse and soft tissue obstructing RUL bronchus.  04/08 CTAP: Mild small bowel ileus, potential enteritis. Nasogastric tube terminates in distal stomach. Cholelithiasis without CT findings of acute cholecystitis.Consider bronchoscopy.  04/09 nephrology consultation: No acute indication for dialysis at present. 04/09 gastroenterology consultation: Recommend high-dose PPI therapy. 04/10 severely hypoxemic despite 100% FiO2.  Recruitment maneuver with significant improvement.  PEEP increased and ARDS strategy on ventilator implemented.  Neuromuscular blockade implemented due to ventilator dyssynchrony. 04/11 PAF with RVR > converted with amiodarone. Gas exchange improving. Off Nimbex 04/12 Echocardiogram: EF 20-25%.  Diffuse HK. 04/13 High airway pressure with vent dyssynchony; given Vecuronium; FIO2 increased to 50%  04/16 Cardiology consultation 04/13 Amiodarone D/C because of SB 04/15 increased HR, EF 20% afib with RVR 04/17 failed weaning trials, HR remains elevated 04/19 ENT consultation for trach tube placement 04/20 no significant changes in the last 24 hours. Has not tolerated any weaning  04/23 PEG placed. 04/23 intermittent ventilator dyssynchrony - sedation regimen adjusted 04/24 Somewhat more responsive. Not F/C 04/25 Trach tube placed 04/25 significantly more responsive.  Still not F/C  INDWELLING  DEVICES: L femoral CVL 04/08 >> 4/16 ETT 04/08 >> 04/25 RUE PICC 4/15 >>  PEG 04/23 >>  Trach tube 04/25 >>   MICRO DATA: MRSA PCR 04/08, 04/20 >> POS Urine 04/08 >> 10k coag negative staph Resp 04/09 >> c/w NOF Blood 04/08 >> NEG Blood 04/09 >> NEG Resp 04/20 >> c/w NOF  ANTIMICROBIALS:  Unasyn 04/08 >> 04/10 Vanc 04/08 >> 4/15 Cefepime 04/10 >> 4/15 Metronidazole 04/10 >> 4/15   SUBJECTIVE:  Status post tracheostomy tube today.  More responsive.  Eyes open spontaneously.  Not F/C.  Synchronous with ventilator.   VITAL SIGNS: BP 120/66   Pulse 66   Temp (!) 97.3 F (36.3 C)   Resp (!) 21   Ht 5\' 10"  (1.778 m)   Wt (!) 307 lb 8.7 oz (139.5 kg)   SpO2 94%   BMI 44.13 kg/m    VENTILATOR SETTINGS: Vent Mode: PSV FiO2 (%):  [30 %] 30 % Set Rate:  [14 bmp] 14 bmp Vt Set:  [500 mL] 500 mL PEEP:  [5 cmH20] 5 cmH20 Pressure Support:  [10 cmH20] 10 cmH20  INTAKE / OUTPUT: I/O last 3 completed shifts: In: 4513.7 [I.V.:3459.2; NG/GT:1054.5] Out: 3925 [Urine:3920; Blood:5]  PHYSICAL EXAMINATION: General: RASS 0, -1, not F/C Neuro: Cranial nerves intact, moves all extremities  HEENT: NCAT, sclerae white Cardiovascular: Regular, no M Lungs: Clear anteriorly Abdomen: Nontender, + BS GU: Severe scrotal edema without significant change Extremities: Continued improvement in BUE edema, symmetric BLE edema  LABS:  BMET Recent Labs  Lab 01/22/18 0407 01/23/18 0422 01/24/18 0443  NA 147* 143 142  K 4.4 4.5 4.0  CL 107 106 105  CO2 37* 35* 35*  BUN 39* 31* 28*  CREATININE 0.79 0.77 0.80  GLUCOSE 154* 141* 139*    Electrolytes Recent Labs  Lab 01/18/18 0526  01/20/18  1610  01/22/18 0407 01/23/18 0422 01/24/18 0443  CALCIUM 7.7*   < > 7.5*   < > 7.7* 7.9* 8.0*  MG 1.8  --  1.7  --   --   --   --   PHOS 3.3  --  3.6  --   --   --   --    < > = values in this interval not displayed.    CBC Recent Labs  Lab 01/21/18 0500 01/22/18 0407 01/23/18 0422   WBC 13.5* 16.5* 15.0*  HGB 8.3* 9.2* 9.4*  HCT 23.8* 29.2* 29.7*  PLT 155 189 169    Coag's Recent Labs  Lab 01/20/18 0411  INR 1.04    Sepsis Markers Recent Labs  Lab 01/18/18 0526 01/19/18 0347 01/20/18 0411  PROCALCITON 0.27 0.22 0.43    ABG Recent Labs  Lab 01/18/18 0626 01/18/18 1751  PHART 7.49* 7.43  PCO2ART 48 55*  PO2ART 71* 132*    Liver Enzymes Recent Labs  Lab 01/18/18 0526 01/20/18 0411 01/21/18 0500  AST 22 38 30  ALT 20 39 30  ALKPHOS 74 125 96  BILITOT 0.3 0.3 0.5  ALBUMIN 1.7* 1.7* 1.7*    Cardiac Enzymes No results for input(s): TROPONINI, PROBNP in the last 168 hours.  Glucose Recent Labs  Lab 01/23/18 1601 01/23/18 1928 01/23/18 2319 01/24/18 0314 01/24/18 0715 01/24/18 1132  GLUCAP 132* 141* 177* 135* 118* 138*    CXR: Tracheostomy tube well-positioned, persistent edema versus acute lung injury pattern    ASSESSMENT / PLAN: PULMONARY A: Acute hypoxemic respiratory failure Aspiration pneumonia ARDS, resolving Pulmonary edema Prolonged mechanical ventilation Tracheostomy tube status P:   Cont vent support - settings reviewed and/or adjusted Wean in PSV mode > ATC as tolerated Cont vent bundle Daily SBT if/when meets criteria  Continue nebulized steroids and bronchodilators  CARDIOVASCULAR A: septic shock, resolved Paroxysmal atrial fibrillation Dilated cardiomyopathy, etiology unclear Hypertension- controlled Intermittent sinus tachycardia P:  MAP goal > 65 mmHg Continue amiodarone  Continue hydrochlorothiazide at reduced dose Continue lisinopril Continue scheduled metoprolol Continue hydralazine as needed to maintain SBP < 160 mmHg  RENAL A:   AKI, resolved Hypernatremia, resolved Severe hypervolemia/anasarca Metabolic alkalosis due to loop diuresis P: Monitor BMET intermittently Monitor I/Os Correct electrolytes as indicated  Continue free water per G-tube Repeat acetazolamide x 1 dose  04/25  GASTROINTESTINAL A:   Abdominal distention, resolved Suspected UGIB, resolved G tube status P:   SUP: Enteral PPI Continue TF protocol  HEMATOLOGIC A: ICU acquired anemia No current evidence of acute blood loss P: DVT px: enoxaparin (resume 4/26) Monitor CBC intermittently Transfuse per usual guidelines   INFECTIOUS A:   Severe sepsis with shock, resolved Aspiration pneumonia, treated P:   Monitor temp, WBC count Micro and abx as above   ENDOCRINE A:   Type 2 diabetes, controlled P:   Continue Lantus (initiated 04/21) Continue resistant scale SSI  NEUROLOGIC A:   Ventilator dyssynchrony, resolved ICU/ventilator associated discomfort History of schizophrenia History of TBI P:   RASS goal: 0, -1 Discontinue fentanyl infusion 4/25 Duragesic patch initiated 4/25 Continue quetiapine (initiated 4/24) Initiate low-dose clonazepam 4/25 Continue intermittent fentanyl as needed D/C Benzodiazepine, wean Precedex    Will update family  CCM time: 96 The above time includes time spent in consultation with patient and/or family members and reviewing care plan on multidisciplinary rounds  Jackson Latino, MD, MD PCCM service Pager [phone] 01/24/2018; 15;15

## 2018-01-25 DIAGNOSIS — K567 Ileus, unspecified: Secondary | ICD-10-CM

## 2018-01-25 LAB — GLUCOSE, CAPILLARY
GLUCOSE-CAPILLARY: 139 mg/dL — AB (ref 65–99)
GLUCOSE-CAPILLARY: 187 mg/dL — AB (ref 65–99)
Glucose-Capillary: 162 mg/dL — ABNORMAL HIGH (ref 65–99)
Glucose-Capillary: 193 mg/dL — ABNORMAL HIGH (ref 65–99)
Glucose-Capillary: 164 mg/dL — ABNORMAL HIGH (ref 65–99)

## 2018-01-25 LAB — BASIC METABOLIC PANEL
Anion gap: 4 — ABNORMAL LOW (ref 5–15)
BUN: 23 mg/dL — AB (ref 6–20)
CALCIUM: 8.1 mg/dL — AB (ref 8.9–10.3)
CO2: 35 mmol/L — AB (ref 22–32)
CREATININE: 0.64 mg/dL (ref 0.61–1.24)
Chloride: 103 mmol/L (ref 101–111)
GFR calc non Af Amer: >60 mL/min (ref >60)
Glucose, Bld: 186 mg/dL — ABNORMAL HIGH (ref 65–99)
Potassium: 3.8 mmol/L (ref 3.5–5.1)
SODIUM: 142 mmol/L (ref 135–145)

## 2018-01-25 LAB — PHOSPHORUS: PHOSPHORUS: 3.6 mg/dL (ref 2.5–4.6)

## 2018-01-25 LAB — MAGNESIUM: MAGNESIUM: 1.5 mg/dL — AB (ref 1.7–2.4)

## 2018-01-25 LAB — VALPROIC ACID LEVEL: Valproic Acid Lvl: 33 ug/mL — ABNORMAL LOW (ref 50.0–100.0)

## 2018-01-25 MED ORDER — CHLORHEXIDINE GLUCONATE 0.12 % MT SOLN
15.0000 mL | Freq: Once | OROMUCOSAL | Status: AC
  Administered 2018-01-25: 15 mL via OROMUCOSAL
  Filled 2018-01-25: qty 15

## 2018-01-25 MED ORDER — MAGNESIUM CITRATE PO SOLN
1.0000 | Freq: Once | ORAL | Status: AC
  Administered 2018-01-25: 1 via ORAL
  Filled 2018-01-25: qty 296

## 2018-01-25 MED ORDER — MAGNESIUM HYDROXIDE 400 MG/5ML PO SUSP
30.0000 mL | Freq: Once | ORAL | Status: AC
  Administered 2018-01-25: 30 mL
  Filled 2018-01-25: qty 30

## 2018-01-25 MED ORDER — FLEET ENEMA 7-19 GM/118ML RE ENEM
1.0000 | ENEMA | Freq: Once | RECTAL | Status: AC
  Administered 2018-01-25: 1 via RECTAL

## 2018-01-25 MED ORDER — MAGNESIUM SULFATE 2 GM/50ML IV SOLN
2.0000 g | Freq: Once | INTRAVENOUS | Status: AC
  Administered 2018-01-25: 2 g via INTRAVENOUS
  Filled 2018-01-25: qty 50

## 2018-01-25 MED ORDER — LACTULOSE 10 GM/15ML PO SOLN
30.0000 g | Freq: Once | ORAL | Status: AC
  Administered 2018-01-25: 30 g
  Filled 2018-01-25: qty 60

## 2018-01-25 MED ORDER — METOCLOPRAMIDE HCL 5 MG/ML IJ SOLN
10.0000 mg | Freq: Two times a day (BID) | INTRAMUSCULAR | Status: AC
  Administered 2018-01-25 (×2): 10 mg via INTRAVENOUS
  Filled 2018-01-25 (×2): qty 2

## 2018-01-25 MED ORDER — FENTANYL 75 MCG/HR TD PT72
75.0000 ug | MEDICATED_PATCH | TRANSDERMAL | Status: DC
  Administered 2018-01-26 – 2018-02-01 (×3): 75 ug via TRANSDERMAL
  Filled 2018-01-25 (×3): qty 1

## 2018-01-25 NOTE — Progress Notes (Signed)
PULMONARY / CRITICAL CARE MEDICINE   Name: Tommy Mathews MRN: [id number] DOB: Jan 08, 1961    ADMISSION DATE:  01/06/2018   PT PROFILE:  50 M SNF resident due to psychiatric illness and history of TBI admitted with acute hypoxemic respiratory failure, severe bilateral pulmonary infiltrates, severe sepsis/septic shock.  Intubated in the ED shortly after arrival.  Was also noted to have dark coffee-ground material suctioned from stomach.  Patient chronically on rivaroxaban.   MAJOR EVENTS/TEST RESULTS: 04/08 Admission as above 04/08 CT chest: Multifocal severe pneumonia with RUL collapse and soft tissue obstructing RUL bronchus.  04/08 CTAP: Mild small bowel ileus, potential enteritis. Nasogastric tube terminates in distal stomach. Cholelithiasis without CT findings of acute cholecystitis.Consider bronchoscopy.  04/09 nephrology consultation: No acute indication for dialysis at present. 04/09 gastroenterology consultation: Recommend high-dose PPI therapy. 04/10 severely hypoxemic despite 100% FiO2.  Recruitment maneuver with significant improvement.  PEEP increased and ARDS strategy on ventilator implemented.  Neuromuscular blockade implemented due to ventilator dyssynchrony. 04/11 PAF with RVR > converted with amiodarone. Gas exchange improving. Off Nimbex 04/12 Echocardiogram: EF 20-25%.  Diffuse HK. 04/13 High airway pressure with vent dyssynchony; given Vecuronium; FIO2 increased to 50%  04/16 Cardiology consultation 04/13 Amiodarone D/C because of SB 04/15 increased HR, EF 20% afib with RVR 04/17 failed weaning trials, HR remains elevated 04/19 ENT consultation for trach tube placement 04/20 no significant changes in the last 24 hours. Has not tolerated any weaning  04/23 PEG placed. 04/23 intermittent ventilator dyssynchrony - sedation regimen adjusted 04/24 Somewhat more responsive. Not F/C 04/25 Trach tube placed 04/25 significantly more responsive.  Still not F/C  INDWELLING  DEVICES: L femoral CVL 04/08 >> 4/16 ETT 04/08 >> 04/25 RUE PICC 4/15 >>  PEG 04/23 >>  Trach tube 04/25 >>   MICRO DATA: MRSA PCR 04/08, 04/20 >> POS Urine 04/08 >> 10k coag negative staph Resp 04/09 >> c/w NOF Blood 04/08 >> NEG Blood 04/09 >> NEG Resp 04/20 >> c/w NOF  ANTIMICROBIALS:  Unasyn 04/08 >> 04/10 Vanc 04/08 >> 4/15 Cefepime 04/10 >> 4/15 Metronidazole 04/10 >> 4/15   SUBJECTIVE:  Status post tracheostomy tube today.  More responsive.  Eyes open spontaneously.  Not F/C.  Synchronous with ventilator.   VITAL SIGNS: BP 133/78   Pulse 72   Temp 97.7 F (36.5 C)   Resp (!) 23   Ht  (1.778 m)   Wt (!) 307 lb 8.7 oz (139.5 kg)   SpO2 98%   BMI 44.13 kg/m    VENTILATOR SETTINGS: Vent Mode: PSV FiO2 (%):  [30 %-40 %] 40 % PEEP:  [5 cmH20] 5 cmH20 Pressure Support:  [10 cmH20] 10 cmH20 Plateau Pressure:  [29 cmH20] 29 cmH20  INTAKE / OUTPUT: I/O last 3 completed shifts: In: 3106.1 [I.V.:1331.1; NG/GT:1775] Out: 5575 [Urine:5575]  PHYSICAL EXAMINATION: General: RASS 0, -1, not F/C, spontaneous eye opening Neuro: Cranial nerves intact, moves all extremities  HEENT: NCAT, sclerae white Cardiovascular: Regular, no M Lungs: Clear anteriorly Abdomen: Nontender, + BS GU: Severe scrotal edema without significant change Extremities: Continued improvement in BUE edema, symmetric BLE edema  LABS:  BMET Recent Labs  Lab 01/23/18 0422 01/24/18 0443 01/25/18 0531  NA 143 142 142  K 4.5 4.0 3.8  CL 106 105 103  CO2 35* 35* 35*  BUN 31* 28* 23*  CREATININE 0.77 0.80 0.64  GLUCOSE 141* 139* 186*    Electrolytes Recent Labs  Lab 01/20/18 0411  01/23/18 0422 01/24/18 0443 01/25/18 0531  CALCIUM 7.5*   < > 7.9* 8.0* 8.1*  MG 1.7  --   --   --  1.5*  PHOS 3.6  --   --   --  3.6   < > = values in this interval not displayed.    CBC Recent Labs  Lab 01/21/18 0500 01/22/18 0407 01/23/18 0422  WBC 13.5* 16.5* 15.0*  HGB 8.3* 9.2* 9.4*   HCT 23.8* 29.2* 29.7*  PLT 155 189 169    Coag's Recent Labs  Lab 01/20/18 0411  INR 1.04    Sepsis Markers Recent Labs  Lab 01/19/18 0347 01/20/18 0411  PROCALCITON 0.22 0.43    ABG Recent Labs  Lab 01/18/18 1751  PHART 7.43  PCO2ART 55*  PO2ART 132*    Liver Enzymes Recent Labs  Lab 01/20/18 0411 01/21/18 0500  AST 38 30  ALT 39 30  ALKPHOS 125 96  BILITOT 0.3 0.5  ALBUMIN 1.7* 1.7*    Cardiac Enzymes No results for input(s): TROPONINI, PROBNP in the last 168 hours.  Glucose Recent Labs  Lab 01/24/18 1555 01/24/18 1915 01/24/18 2317 01/25/18 0337 01/25/18 0755 01/25/18 1139  GLUCAP 164* 147* 147* 162* 139* 193*    CXR: Tracheostomy tube well-positioned, persistent edema versus acute lung injury pattern    ASSESSMENT / PLAN: PULMONARY A: Acute hypoxemic respiratory failure Aspiration pneumonia ARDS has resolved Pulmonary edema Prolonged mechanical ventilation- slow to wean Tracheostomy tube status P:   Cont vent support - settings reviewed and/or adjusted Wean in PSV mode > ATC as tolerated Cont vent bundle Daily SBT if/when meets criteria  Continue nebulized steroids and bronchodilators  CARDIOVASCULAR A: septic shock, resolved Paroxysmal atrial fibrillation remains in NSR Dilated cardiomyopathy, etiology unclear Hypertension- controlled P:  MAP goal > 65 mmHg Continue amiodarone  Continue hydrochlorothiazide at reduced dose Continue lisinopril Continue scheduled metoprolol Continue hydralazine as needed to maintain SBP < 160 mmHg  RENAL A:   AKI, resolved Hypernatremia, resolved Severe hypervolemia/anasarca Metabolic alkalosis due to loop diuresis P: Monitor BMET intermittently Monitor I/Os Correct electrolytes as indicated  Continue free water per G-tube  GASTROINTESTINAL A:   Abdominal distention, resolved Suspected UGIB, resolved G tube status Ileus/ fecal impaction P:   SUP: Enteral PPI Continue TF  protocol Continue on bowel regimen, mag citrate today  HEMATOLOGIC A: ICU acquired anemia No current evidence of acute blood loss P: DVT px: enoxaparin (resume 4/26) Monitor CBC intermittently Transfuse per usual guidelines   INFECTIOUS A:   Severe sepsis with shock, resolved Aspiration pneumonia, treated P:   Monitor temp, WBC count Completed ABX treatment  ENDOCRINE A:   Type 2 diabetes, controlled P:   Continue Lantus (initiated 04/21) Continue resistant scale SSI  NEUROLOGIC A:   Ventilator dyssynchrony, resolved ICU/ventilator associated discomfort History of schizophrenia History of TBI P:   RASS goal: 0, -1 Discontinue fentanyl infusion 4/25 Duragesic patch initiated 4/25, decrease Fentanyl patch to 75 mcg 4/27 Continue quetiapine (initiated 4/24) Initiate low-dose clonazepam 4/25 Continue intermittent fentanyl as needed D/C Benzodiazepine, wean Precedex    Will update family  CCM time: 90 The above time includes time spent in consultation with patient and/or family members and reviewing care plan on multidisciplinary rounds  Jackson Latino, MD, MD PCCM service Pager [phone] 01/24/2018; 15;15

## 2018-01-25 NOTE — Progress Notes (Signed)
PT Cancellation Note  Patient Details Name: Tommy Mathews MRN: [id number] DOB: 11/04/1960   Cancelled Treatment:    Reason Eval/Treat Not Completed: Fatigue/lethargy limiting ability to participate.  Per RN, pt is still not alert enough to actively participate in a PT evaluation.  Will re-attempt 01/26/2018 to see if pt is appropriate.   Glenetta Hew, PT, DPT 01/25/2018, 1:38 PM

## 2018-01-25 NOTE — Progress Notes (Signed)
Patient ID: Tommy Mathews, male   DOB: 11-13-1960, 57 y.o.   MRN: [id number]  [employer] PROGRESS NOTE  Tommy Mathews AQT:[id number] DOB: 03-May-1961 DOA: 01/06/2018 PCP: Patient, [name]  HPI/Subjective: Patient is status post trach and on sedation.  Unable to provide any review of systems at this time.  Objective: Vitals:   01/25/18 1130 01/25/18 1200  BP:  (!) 138/92  Pulse: 63 (!) 59  Resp: (!) 22 (!) 25  Temp: (!) 97.3 F (36.3 C) (!) 97.5 F (36.4 C)  SpO2: 99% 95%    Filed Weights   01/13/18 0100 01/19/18 0347 01/20/18 1200  Weight: 130.1 kg (286 lb 13.1 oz) (!) 136.7 kg (301 lb 5.9 oz) (!) 139.5 kg (307 lb 8.7 oz)    ROS: Review of Systems  Unable to perform ROS: Acuity of condition   Exam: Physical Exam  HENT:  Nose: No mucosal edema.  Eyes: Pupils are equal, round, and reactive to light. Conjunctivae and lids are normal.  Neck: Carotid bruit is not present. No thyroid mass present.  Cardiovascular: Regular rhythm, S1 normal, S2 normal and normal heart sounds.  Respiratory: He has no decreased breath sounds. He has no wheezes. He has no rhonchi. He has no rales.  GI: Soft. Bowel sounds are normal. There is tenderness.  Genitourinary:  Genitourinary Comments: 4+ scrotal edema, penile edema  Musculoskeletal:       Right ankle: He exhibits swelling.       Left ankle: He exhibits swelling.  Neurological:  Sedated  Skin: Skin is warm. No rash noted. Nails show no clubbing.  Psychiatric:  On sedation      Data Reviewed: Basic Metabolic Panel: Recent Labs  Lab 01/20/18 0411 01/21/18 0500 01/22/18 0407 01/23/18 0422 01/24/18 0443 01/25/18 0531  NA 146* 147* 147* 143 142 142  K 3.6 3.3* 4.4 4.5 4.0 3.8  CL 108 110 107 106 105 103  CO2 30 33* 37* 35* 35* 35*  GLUCOSE 334* 193* 154* 141* 139* 186*  BUN 52* 51* 39* 31* 28* 23*  CREATININE 1.05 1.04 0.79 0.77 0.80 0.64  CALCIUM 7.5* 7.4* 7.7* 7.9* 8.0* 8.1*  MG 1.7  --   --   --   --  1.5*  PHOS  3.6  --   --   --   --  3.6   Liver Function Tests: Recent Labs  Lab 01/20/18 0411 01/21/18 0500  AST 38 30  ALT 39 30  ALKPHOS 125 96  BILITOT 0.3 0.5  PROT 5.2* 4.7*  ALBUMIN 1.7* 1.7*   CBC: Recent Labs  Lab 01/19/18 0347 01/21/18 0500 01/22/18 0407 01/23/18 0422  WBC 8.4 13.5* 16.5* 15.0*  NEUTROABS 7.2*  --   --   --   HGB 10.3* 8.3* 9.2* 9.4*  HCT 31.3* 23.8* 29.2* 29.7*  MCV 82.8 86.0 85.7 86.7  PLT 187 155 189 169   BNP (last 3 results) Recent Labs    01/06/18 1944 01/16/18 2332  BNP 37.0 402.0*     CBG: Recent Labs  Lab 01/24/18 1915 01/24/18 2317 01/25/18 0337 01/25/18 0755 01/25/18 1139  GLUCAP 147* 147* 162* 139* 193*    Recent Results (from the past 240 hour(s))  Culture, expectorated sputum-assessment     Status: None   Collection Time: 01/18/18  5:07 PM  Result Value Ref Range Status   Specimen Description ENDOTRACHEAL  Final   Special Requests NONE  Final   Sputum evaluation   Final  THIS SPECIMEN IS ACCEPTABLE FOR SPUTUM CULTURE Performed at [facility], [address] [place], [place] [address]    Report Status 01/18/2018 FINAL  Final  Culture, respiratory (NON-Expectorated)     Status: None   Collection Time: 01/18/18  5:07 PM  Result Value Ref Range Status   Specimen Description   Final    ENDOTRACHEAL Performed at [facility], [address] [place], [place] [address]    Special Requests   Final    NONE Reflexed from [id number] Performed at [facility], [address] [place], [place] [address]    Gram Stain   Final    MODERATE WBC PRESENT,BOTH PMN AND MONONUCLEAR NO SQUAMOUS EPITHELIAL CELLS SEEN RARE BUDDING YEAST SEEN    Culture   Final    RARE Consistent with normal respiratory flora. Performed at [facility], [place] [address] [place], [place] [address]    Report Status 01/21/2018 FINAL  Final  MRSA PCR Screening     Status: Abnormal   Collection Time: 01/18/18  8:26 PM   Result Value Ref Range Status   MRSA by PCR POSITIVE (A) NEGATIVE Final    Comment:        The GeneXpert MRSA Assay (FDA approved for NASAL specimens only), is one component of a comprehensive MRSA colonization surveillance program. It is not intended to diagnose MRSA infection nor to guide or monitor treatment for MRSA infections. RESULT CALLED TO, READ BACK BY AND VERIFIED WITH: ANGELA BREHUN AT 2200 ON 01/18/18 RWW Performed at [facility], [address] [place],  [address]      Studies: Dg Abd 1 View  Result Date: 01/24/2018 CLINICAL DATA:  Constipation EXAM: ABDOMEN - 1 VIEW COMPARISON:  01/13/2018 FINDINGS: A gastrostomy tube is demonstrated over the stomach. There is mild gaseous distention of the stomach, colon, and small bowel. Prominent stool in the rectum. Changes may be due to adynamic ileus or pseudo-obstruction from impacted rectal stool. No radiopaque stones. Infiltration or atelectasis in the lung bases. IMPRESSION: Mildly gas distended stomach, small bowel, and colon with prominent stool in the rectum suggesting adynamic ileus or pseudo-obstruction due to stool impaction. Electronically Signed   By: Lucienne Capers M.D.   On: 01/24/2018 23:47    Scheduled Meds: . amiodarone  200 mg Per Tube Daily  . budesonide (PULMICORT) nebulizer solution  0.5 mg Nebulization BID  . chlorhexidine gluconate (MEDLINE KIT)  15 mL Mouth Rinse BID  . clonazePAM  0.5 mg Per Tube BID  . enoxaparin (LOVENOX) injection  40 mg Subcutaneous Q12H  . feeding supplement (PRO-STAT SUGAR FREE 64)  30 mL Per Tube TID  . fentaNYL  100 mcg Transdermal Q72H  . free water  200 mL Per Tube Q8H  . hydrochlorothiazide  25 mg Oral Daily  . insulin aspart  0-20 Units Subcutaneous Q4H  . insulin glargine  20 Units Subcutaneous QHS  . ipratropium-albuterol  3 mL Nebulization Q6H  . lisinopril  5 mg Per Tube Daily  . mouth rinse  15 mL Mouth Rinse 10 times per day  . metoprolol  tartrate  12.5 mg Per Tube TID  . multivitamin  15 mL Per Tube Daily  . pantoprazole sodium  40 mg Per Tube Daily  . QUEtiapine  100 mg Per Tube BID  . senna-docusate  2 tablet Per Tube BID  . Valproate Sodium  1,000 mg Per Tube BID   Continuous Infusions: . dexmedetomidine (PRECEDEX) IV infusion 0.8  mcg/kg/hr (01/25/18 1226)  . feeding supplement (VITAL 1.5 CAL) 45 mL/hr at 01/25/18 0600    Assessment/Plan:  1. Acute hypoxic respiratory failure.  Patient difficult to wean and is now trached.  Currently on 40% FiO2.  Also on sedation. 2. Septic shock secondary to pneumonia and urinary tract infection.  Patient is off vasopressors and finished antibiotics. 3. Gastrointestinal bleed.  Hemoglobin stable on PPI 4. Atrial fibrillation with rapid ventricular response.  Now in normal sinus rhythm on oral amiodarone and metoprolol. 5. Acute kidney injury resolved. 6. Anasarca with scrotal edema.  Consider switching hydrochlorothiazide over the Lasix. 7. Type 2 diabetes mellitus on glargine insulin and sliding scale 8. Nutrition on PEG tube placement 9. Depression on Prozac and Depakene and Seroquel.  Check a Depakote level  Code Status:     Code Status Orders  (From admission, onward)        Start     Ordered   01/06/18 2227  Full code  Continuous     01/06/18 2226    Code Status History    This patient has a current code status but no historical code status.     Family Communication: As per critical care specialist Disposition Plan: As per critical care specialist  Consultants:  Critical care specialist  Time spent: 26 minutes  [place]

## 2018-01-25 NOTE — Progress Notes (Signed)
PT Cancellation Note  Patient Details Name: Tommy Mathews MRN: [id number] DOB: 14-Sep-1961   Cancelled Treatment:    Reason Eval/Treat Not Completed: Fatigue/lethargy limiting ability to participate.  Order received.  Chart reviewed.  Per RN, pt is not able to follow commands to initiate movement at this time.  Requested that PT re-attempt after lunch.  Will check back for appropriateness when time allows.   Glenetta Hew, PT, DPT 01/25/2018, 8:38 AM

## 2018-01-26 ENCOUNTER — Encounter (HOSPITAL_COMMUNITY): Payer: Self-pay | Admitting: Emergency Medicine

## 2018-01-26 ENCOUNTER — Other Ambulatory Visit: Payer: Self-pay

## 2018-01-26 LAB — CBC WITH DIFFERENTIAL/PLATELET
Band Neutrophils: 0 %
Basophils Absolute: 0 10*3/uL (ref 0–0.1)
Basophils Relative: 0 %
Blasts: 0 %
Eosinophils Absolute: 0.2 10*3/uL (ref 0–0.7)
Eosinophils Relative: 2 %
HEMATOCRIT: 25.5 % — AB (ref 40.0–52.0)
HEMOGLOBIN: 8.4 g/dL — AB (ref 13.0–18.0)
Lymphocytes Relative: 20 %
Lymphs Abs: 1.8 10*3/uL (ref 1.0–3.6)
MCH: 28 pg (ref 26.0–34.0)
MCHC: 32.9 g/dL (ref 32.0–36.0)
MCV: 84.9 fL (ref 80.0–100.0)
Metamyelocytes Relative: 0 %
Monocytes Absolute: 1 10*3/uL (ref 0.2–1.0)
Monocytes Relative: 11 %
Myelocytes: 2 %
NEUTROS PCT: 65 %
NRBC: 1 /100{WBCs} — AB
Neutro Abs: 5.9 10*3/uL (ref 1.4–6.5)
Other: 0 %
PROMYELOCYTES RELATIVE: 0 %
Platelets: 162 10*3/uL (ref 150–440)
RBC: 3 MIL/uL — AB (ref 4.40–5.90)
RDW: 18.1 % — ABNORMAL HIGH (ref 11.5–14.5)
WBC: 8.9 10*3/uL (ref 3.8–10.6)

## 2018-01-26 LAB — GLUCOSE, CAPILLARY
Glucose-Capillary: 107 mg/dL — ABNORMAL HIGH (ref 65–99)
Glucose-Capillary: 115 mg/dL — ABNORMAL HIGH (ref 65–99)
Glucose-Capillary: 120 mg/dL — ABNORMAL HIGH (ref 65–99)
Glucose-Capillary: 145 mg/dL — ABNORMAL HIGH (ref 65–99)
Glucose-Capillary: 167 mg/dL — ABNORMAL HIGH (ref 65–99)
Glucose-Capillary: 224 mg/dL — ABNORMAL HIGH (ref 65–99)
Glucose-Capillary: 71 mg/dL (ref 65–99)

## 2018-01-26 LAB — COMPREHENSIVE METABOLIC PANEL
ALT: 19 U/L (ref 17–63)
AST: 23 U/L (ref 15–41)
Albumin: 1.6 g/dL — ABNORMAL LOW (ref 3.5–5.0)
Alkaline Phosphatase: 74 U/L (ref 38–126)
Anion gap: 4 — ABNORMAL LOW (ref 5–15)
BILIRUBIN TOTAL: 0.3 mg/dL (ref 0.3–1.2)
BUN: 21 mg/dL — AB (ref 6–20)
CHLORIDE: 101 mmol/L (ref 101–111)
CO2: 37 mmol/L — ABNORMAL HIGH (ref 22–32)
CREATININE: 0.6 mg/dL — AB (ref 0.61–1.24)
Calcium: 8.1 mg/dL — ABNORMAL LOW (ref 8.9–10.3)
GFR calc Af Amer: >60 mL/min (ref >60)
Glucose, Bld: 104 mg/dL — ABNORMAL HIGH (ref 65–99)
Potassium: 4.1 mmol/L (ref 3.5–5.1)
Sodium: 142 mmol/L (ref 135–145)
Total Protein: 5.2 g/dL — ABNORMAL LOW (ref 6.5–8.1)

## 2018-01-26 LAB — MAGNESIUM: Magnesium: 1.7 mg/dL (ref 1.7–2.4)

## 2018-01-26 LAB — PHOSPHORUS: PHOSPHORUS: 3.5 mg/dL (ref 2.5–4.6)

## 2018-01-26 MED ORDER — FENTANYL CITRATE (PF) 100 MCG/2ML IJ SOLN
25.0000 ug | Freq: Once | INTRAMUSCULAR | Status: AC
  Administered 2018-01-26: 25 ug via INTRAVENOUS
  Filled 2018-01-26: qty 2

## 2018-01-26 MED ORDER — CHLORHEXIDINE GLUCONATE 0.12 % MT SOLN
OROMUCOSAL | Status: AC
  Filled 2018-01-26: qty 15

## 2018-01-26 MED ORDER — METOPROLOL TARTRATE 5 MG/5ML IV SOLN
5.0000 mg | Freq: Once | INTRAVENOUS | Status: AC
  Administered 2018-01-26: 5 mg via INTRAVENOUS

## 2018-01-26 MED ORDER — MAGNESIUM SULFATE 2 GM/50ML IV SOLN
2.0000 g | Freq: Once | INTRAVENOUS | Status: AC
  Administered 2018-01-26: 2 g via INTRAVENOUS
  Filled 2018-01-26: qty 50

## 2018-01-26 MED ORDER — AMIODARONE IV BOLUS ONLY 150 MG/100ML
INTRAVENOUS | Status: AC
  Administered 2018-01-26: 150 mg via INTRAVENOUS
  Filled 2018-01-26: qty 100

## 2018-01-26 MED ORDER — AMIODARONE IV BOLUS ONLY 150 MG/100ML
150.0000 mg | Freq: Once | INTRAVENOUS | Status: AC
  Administered 2018-01-26: 150 mg via INTRAVENOUS

## 2018-01-26 MED ORDER — METOPROLOL TARTRATE 5 MG/5ML IV SOLN
INTRAVENOUS | Status: AC
  Administered 2018-01-26: 5 mg via INTRAVENOUS
  Filled 2018-01-26: qty 5

## 2018-01-26 MED ORDER — METOPROLOL TARTRATE 5 MG/5ML IV SOLN: 2.5000 mg | INTRAVENOUS | Status: AC

## 2018-01-26 NOTE — Progress Notes (Signed)
Patient ID: Tommy Mathews, male   DOB: 04/18/1961, 57 y.o.   MRN: [id number]  [employer] PROGRESS NOTE  Kobee Medlen QMG:[id number] DOB: 05-18-1961 DOA: 01/06/2018 PCP: Patient, [name]  HPI/Subjective: Patient is status post trach and on sedation.  Unable to provide any review of systems at this time.  Patient barely able to squeeze with his left hand.  Objective: Vitals:   01/26/18 1100 01/26/18 1200  BP: 125/65 121/76  Pulse: 86 90  Resp: (!) 31 (!) 27  Temp: 99 F (37.2 C) 99.3 F (37.4 C)  SpO2: 91% 96%    Filed Weights   01/19/18 0347 01/20/18 1200 01/26/18 0418  Weight: (!) 136.7 kg (301 lb 5.9 oz) (!) 139.5 kg (307 lb 8.7 oz) 133.2 kg (293 lb 10.4 oz)    ROS: Review of Systems  Unable to perform ROS: Acuity of condition   Exam: Physical Exam  HENT:  Nose: No mucosal edema.  Eyes: Pupils are equal, round, and reactive to light. Conjunctivae and lids are normal.  Neck: Carotid bruit is not present. No thyroid mass present.  Cardiovascular: Regular rhythm, S1 normal, S2 normal and normal heart sounds.  Respiratory: He has no decreased breath sounds. He has no wheezes. He has no rhonchi. He has no rales.  GI: Soft. Bowel sounds are normal. There is tenderness.  Genitourinary:  Genitourinary Comments: 4+ scrotal edema, penile edema  Musculoskeletal:       Right ankle: He exhibits swelling.       Left ankle: He exhibits swelling.  Neurological:  Sedated  Skin: Skin is warm. No rash noted. Nails show no clubbing.  Psychiatric:  On sedation      Data Reviewed: Basic Metabolic Panel: Recent Labs  Lab 01/20/18 0411  01/22/18 0407 01/23/18 0422 01/24/18 0443 01/25/18 0531 01/26/18 0447  NA 146*   < > 147* 143 142 142 142  K 3.6   < > 4.4 4.5 4.0 3.8 4.1  CL 108   < > 107 106 105 103 101  CO2 30   < > 37* 35* 35* 35* 37*  GLUCOSE 334*   < > 154* 141* 139* 186* 104*  BUN 52*   < > 39* 31* 28* 23* 21*  CREATININE 1.05   < > 0.79 0.77 0.80 0.64  0.60*  CALCIUM 7.5*   < > 7.7* 7.9* 8.0* 8.1* 8.1*  MG 1.7  --   --   --   --  1.5* 1.7  PHOS 3.6  --   --   --   --  3.6 3.5   < > = values in this interval not displayed.   Liver Function Tests: Recent Labs  Lab 01/20/18 0411 01/21/18 0500 01/26/18 0447  AST 38 30 23  ALT 39 30 19  ALKPHOS 125 96 74  BILITOT 0.3 0.5 0.3  PROT 5.2* 4.7* 5.2*  ALBUMIN 1.7* 1.7* 1.6*   CBC: Recent Labs  Lab 01/21/18 0500 01/22/18 0407 01/23/18 0422 01/26/18 0447  WBC 13.5* 16.5* 15.0* 8.9  NEUTROABS  --   --   --  5.9  HGB 8.3* 9.2* 9.4* 8.4*  HCT 23.8* 29.2* 29.7* 25.5*  MCV 86.0 85.7 86.7 84.9  PLT 155 189 169 162   BNP (last 3 results) Recent Labs    01/06/18 1944 01/16/18 2332  BNP 37.0 402.0*     CBG: Recent Labs  Lab 01/25/18 1958 01/26/18 0005 01/26/18 0401 01/26/18 0727 01/26/18 1140  GLUCAP 164* 145* 71  120* 167*    Recent Results (from the past 240 hour(s))  Culture, expectorated sputum-assessment     Status: None   Collection Time: 01/18/18  5:07 PM  Result Value Ref Range Status   Specimen Description ENDOTRACHEAL  Final   Special Requests NONE  Final   Sputum evaluation   Final    THIS SPECIMEN IS ACCEPTABLE FOR SPUTUM CULTURE Performed at [facility], [address] [place], [place] [address]    Report Status 01/18/2018 FINAL  Final  Culture, respiratory (NON-Expectorated)     Status: None   Collection Time: 01/18/18  5:07 PM  Result Value Ref Range Status   Specimen Description   Final    ENDOTRACHEAL Performed at [facility], [address], [place], [place] [address]    Special Requests   Final    NONE Reflexed from [id number] Performed at [facility], [address] [place], [place] [address]    Gram Stain   Final    MODERATE WBC PRESENT,BOTH PMN AND MONONUCLEAR NO SQUAMOUS EPITHELIAL CELLS SEEN RARE BUDDING YEAST SEEN    Culture   Final    RARE Consistent with normal respiratory flora. Performed at [facility], [place] [address], [place], [place] [address]    Report Status 01/21/2018 FINAL  Final  MRSA PCR Screening     Status: Abnormal   Collection Time: 01/18/18  8:26 PM  Result Value Ref Range Status   MRSA by PCR POSITIVE (A) NEGATIVE Final    Comment:        The GeneXpert MRSA Assay (FDA approved for NASAL specimens only), is one component of a comprehensive MRSA colonization surveillance program. It is not intended to diagnose MRSA infection nor to guide or monitor treatment for MRSA infections. RESULT CALLED TO, READ BACK BY AND VERIFIED WITH: ANGELA BREHUN AT 2200 ON 01/18/18 RWW Performed at [facility], [address] [place], [place] [address]      Studies: Dg Abd 1 View  Result Date: 01/24/2018 CLINICAL DATA:  Constipation EXAM: ABDOMEN - 1 VIEW COMPARISON:  01/13/2018 FINDINGS: A gastrostomy tube is demonstrated over the stomach. There is mild gaseous distention of the stomach, colon, and small bowel. Prominent stool in the rectum. Changes may be due to adynamic ileus or pseudo-obstruction from impacted rectal stool. No radiopaque stones. Infiltration or atelectasis in the lung bases. IMPRESSION: Mildly gas distended stomach, small bowel, and colon with prominent stool in the rectum suggesting adynamic ileus or pseudo-obstruction due to stool impaction. Electronically Signed   By: Lucienne Capers M.D.   On: 01/24/2018 23:47    Scheduled Meds: . amiodarone  200 mg Per Tube Daily  . budesonide (PULMICORT) nebulizer solution  0.5 mg Nebulization BID  . chlorhexidine gluconate (MEDLINE KIT)  15 mL Mouth Rinse BID  . clonazePAM  0.5 mg Per Tube BID  . enoxaparin (LOVENOX) injection  40 mg Subcutaneous Q12H  . feeding supplement (PRO-STAT SUGAR FREE 64)  30 mL Per Tube TID  . fentaNYL  75 mcg Transdermal Q72H  . free water  200 mL Per Tube Q8H  . hydrochlorothiazide  25 mg Oral Daily  . insulin aspart  0-20 Units Subcutaneous Q4H  . insulin glargine  20  Units Subcutaneous QHS  . ipratropium-albuterol  3 mL Nebulization Q6H  . lisinopril  5 mg Per Tube Daily  . mouth rinse  15 mL Mouth Rinse 10 times per day  . metoprolol tartrate  12.5 mg  Per Tube TID  . multivitamin  15 mL Per Tube Daily  . pantoprazole sodium  40 mg Per Tube Daily  . QUEtiapine  100 mg Per Tube BID  . senna-docusate  2 tablet Per Tube BID  . Valproate Sodium  1,000 mg Per Tube BID   Continuous Infusions: . dexmedetomidine (PRECEDEX) IV infusion Stopped (01/26/18 1249)  . feeding supplement (VITAL 1.5 CAL) 45 mL/hr at 01/26/18 0700    Assessment/Plan:  1. Acute hypoxic respiratory failure.  Patient difficult to wean and is now trached.  Currently on 40% FiO2.  Also on sedation.  Breathing very shallow and rapid. 2. Acute encephalopathy.  Consider CT scan of the head for further evaluation.  Sister states the patient is mostly wheelchair-bound but usually able to move around a little bit. 3. Septic shock secondary to pneumonia and urinary tract infection.  Patient is off vasopressors and finished antibiotics. 4. Gastrointestinal bleed.  Hemoglobin stable on PPI.  Endoscopy negative. 5. Atrial fibrillation with rapid ventricular response.  Now in normal sinus rhythm on oral amiodarone and metoprolol. 6. Acute kidney injury resolved. 7. Anasarca with scrotal edema.  Consider switching hydrochlorothiazide over the Lasix. 8. Type 2 diabetes mellitus on glargine insulin and sliding scale 9. Nutrition on PEG tube placement 10. Depression on Prozac and Depakene and Seroquel.  Check a Depakote level  Code Status:     Code Status Orders  (From admission, onward)        Start     Ordered   01/06/18 2227  Full code  Continuous     01/06/18 2226    Code Status History    This patient has a current code status but no historical code status.     Family Communication:  sister at the bedside  Disposition Plan: As per critical care specialist  Consultants:  Critical  care specialist  Time spent: 28 minutes  [place]

## 2018-01-26 NOTE — Plan of Care (Signed)
VSS O/N, Pt. RASS: +2 to -2, precedex gtt remains.  Pt. Unable to follow commands, opens eyes spont/no eye contact, non-purposeful mvmt in all extremeties. UOP 100-150 mL/h, TF running w/o complication, Pt. Had a very large BM O/N- flexi seal placed. Ventilator remains on P/S with FiO2 @ 40%. No care concerns at this time.

## 2018-01-26 NOTE — Progress Notes (Signed)
Pt went into svt rate of 188. Md at bedside.  iv lopressor given and amio bolus infusing. Pt now in NSR rate of 90. Melodye Ped

## 2018-01-26 NOTE — Progress Notes (Addendum)
PULMONARY / CRITICAL CARE MEDICINE   Name: Tommy Mathews MRN: [id number] DOB: 03/26/1961    ADMISSION DATE:  01/06/2018   PT PROFILE:  33 M SNF resident due to psychiatric illness and history of TBI admitted with acute hypoxemic respiratory failure, severe bilateral pulmonary infiltrates, severe sepsis/septic shock.  Intubated in the ED shortly after arrival.  Was also noted to have dark coffee-ground material suctioned from stomach.  Patient chronically on rivaroxaban.   MAJOR EVENTS/TEST RESULTS: 04/08 Admission as above 04/08 CT chest: Multifocal severe pneumonia with RUL collapse and soft tissue obstructing RUL bronchus.  04/08 CTAP: Mild small bowel ileus, potential enteritis. Nasogastric tube terminates in distal stomach. Cholelithiasis without CT findings of acute cholecystitis.Consider bronchoscopy.  04/09 nephrology consultation: No acute indication for dialysis at present. 04/09 gastroenterology consultation: Recommend high-dose PPI therapy. 04/10 severely hypoxemic despite 100% FiO2.  Recruitment maneuver with significant improvement.  PEEP increased and ARDS strategy on ventilator implemented.  Neuromuscular blockade implemented due to ventilator dyssynchrony. 04/11 PAF with RVR > converted with amiodarone. Gas exchange improving. Off Nimbex 04/12 Echocardiogram: EF 20-25%.  Diffuse HK. 04/13 High airway pressure with vent dyssynchony; given Vecuronium; FIO2 increased to 50%  04/16 Cardiology consultation 04/13 Amiodarone D/C because of SB 04/15 increased HR, EF 20% afib with RVR 04/17 failed weaning trials, HR remains elevated 04/19 ENT consultation for trach tube placement 04/20 no significant changes in the last 24 hours. Has not tolerated any weaning  04/23 PEG placed. 04/23 intermittent ventilator dyssynchrony - sedation regimen adjusted 04/24 Somewhat more responsive. Not F/C 04/25 Trach tube placed 04/25 significantly more responsive.  Still not F/C 04/26 Spontaneous  eye opening 04/27 fecal management tube placed  INDWELLING DEVICES: L femoral CVL 04/08 >> 4/16 ETT 04/08 >> 04/25 RUE PICC 4/15 >>  PEG 04/23 >>  Trach tube 04/25 >>   MICRO DATA: MRSA PCR 04/08, 04/20 >> POS Urine 04/08 >> 10k coag negative staph Resp 04/09 >> c/w NOF Blood 04/08 >> NEG Blood 04/09 >> NEG Resp 04/20 >> c/w NOF  ANTIMICROBIALS:  Unasyn 04/08 >> 04/10 Vanc 04/08 >> 4/15 Cefepime 04/10 >> 4/15 Metronidazole 04/10 >> 4/15   SUBJECTIVE:  Status post tracheostomy tube today.  More responsive.  Eyes open spontaneously.  Not F/C.  Synchronous with ventilator.   VITAL SIGNS: BP 122/73   Pulse 95   Temp 99.1 F (37.3 C) (Bladder)   Resp (!) 29   Ht  (1.778 m)   Wt 293 lb 10.4 oz (133.2 kg)   SpO2 97%   BMI 42.13 kg/m    VENTILATOR SETTINGS: Vent Mode: PSV FiO2 (%):  [40 %] 40 % PEEP:  [5 cmH20] 5 cmH20 Pressure Support:  [10 cmH20] 10 cmH20  INTAKE / OUTPUT: I/O last 3 completed shifts: In: 3406.6 [I.V.:946.6; Other:260; NG/GT:2150; IV Piggyback:50] Out: 4525 [Urine:4525]  PHYSICAL EXAMINATION: General: RASS 0, -1, not F/C, spontaneous eye opening Neuro: Cranial nerves intact, facial asymmetry  HEENT: NCAT, sclerae white Cardiovascular: Regular, no M Lungs: Clear anteriorly Abdomen: Nontender, + BS GU: Severe scrotal edema without significant change Extremities: Continued improvement in BUE edema, symmetric BLE edema  LABS:  BMET Recent Labs  Lab 01/24/18 0443 01/25/18 0531 01/26/18 0447  NA 142 142 142  K 4.0 3.8 4.1  CL 105 103 101  CO2 35* 35* 37*  BUN 28* 23* 21*  CREATININE 0.80 0.64 0.60*  GLUCOSE 139* 186* 104*    Electrolytes Recent Labs  Lab 01/20/18 0411  01/24/18 0443 01/25/18  4098 01/26/18 0447  CALCIUM 7.5*   < > 8.0* 8.1* 8.1*  MG 1.7  --   --  1.5* 1.7  PHOS 3.6  --   --  3.6 3.5   < > = values in this interval not displayed.    CBC Recent Labs  Lab 01/22/18 0407 01/23/18 0422 01/26/18 0447   WBC 16.5* 15.0* 8.9  HGB 9.2* 9.4* 8.4*  HCT 29.2* 29.7* 25.5*  PLT 189 169 162    Coag's Recent Labs  Lab 01/20/18 0411  INR 1.04    Sepsis Markers Recent Labs  Lab 01/20/18 0411  PROCALCITON 0.43    ABG No results for input(s): PHART, PCO2ART, PO2ART in the last 168 hours.  Liver Enzymes Recent Labs  Lab 01/20/18 0411 01/21/18 0500 01/26/18 0447  AST 38 30 23  ALT 39 30 19  ALKPHOS 125 96 74  BILITOT 0.3 0.5 0.3  ALBUMIN 1.7* 1.7* 1.6*    Cardiac Enzymes No results for input(s): TROPONINI, PROBNP in the last 168 hours.  Glucose Recent Labs  Lab 01/25/18 1958 01/26/18 0005 01/26/18 0401 01/26/18 0727 01/26/18 1140 01/26/18 1600  GLUCAP 164* 145* 71 120* 167* 107*    CXR: Tracheostomy tube well-positioned, persistent edema versus acute lung injury pattern    ASSESSMENT / PLAN: PULMONARY A: Failure to wean from vent Acute hypoxemic respiratory failure Aspiration pneumonia ARDS has resolved Pulmonary edema Prolonged mechanical ventilation- slow to wean Tracheostomy tube status P:   Cont vent support - settings reviewed and/or adjusted Wean in PSV mode > ATC as tolerated- tolerated 2 hours of TC today Cont vent bundle Daily SBT if/when meets criteria  Continue nebulized steroids and bronchodilators  CARDIOVASCULAR A: septic shock, resolved Paroxysmal atrial fibrillation remains in NSR Dilated cardiomyopathy, etiology unclear Hypertension- controlled P:  MAP goal > 65 mmHg Continue amiodarone  Continue hydrochlorothiazide at reduced dose Continue lisinopril Continue scheduled metoprolol Continue hydralazine as needed to maintain SBP < 160 mmHg  RENAL A:   AKI, resolved Hypernatremia, resolved Severe hypervolemia/anasarca Metabolic alkalosis due to loop diuresis P: Monitor BMET intermittently Monitor I/Os Correct electrolytes as indicated  Continue free water per G-tube  GASTROINTESTINAL A:   Abdominal distention,  resolved Suspected UGIB, resolved G tube status Ileus/ fecal impaction has resolved now has fecal management tube P:   SUP: Enteral PPI Continue TF protocol Continue on bowel regimen  HEMATOLOGIC A: ICU acquired anemia No current evidence of acute blood loss P: DVT px: enoxaparin (resume 4/26) Monitor CBC intermittently Transfuse per usual guidelines   INFECTIOUS A:   Severe sepsis with shock, resolved Aspiration pneumonia, treated P:   Monitor temp, WBC count Completed ABX treatment  ENDOCRINE A:   Type 2 diabetes, controlled P:   Continue Lantus (initiated 04/21) Continue resistant scale SSI  NEUROLOGIC A:   Encephalopathy with facial asymmetry Ventilator dyssynchrony, resolved ICU/ventilator associated discomfort History of schizophrenia History of TBI P:   Will get CT head to evaluate facial asymetry Discontinue fentanyl infusion 4/25 Duragesic patch initiated 4/25, decrease Fentanyl patch to 75 mcg (4/27) Continue quetiapine (initiated 4/24) Initiate low-dose clonazepam 4/25 D/C intermittent fentanyl as needed (4/28) D/C Benzodiazepine, D/C Precedex (4/28)   FAMILY: Daughter updated  Disp: case management consult for placement  ADDENDUM: Patient went into asymptomaticSVT with HR of 180. Treated with Metoprolol of 5 mg and re- bolus Amiodarone 150 mg.  He responded  Back in SR.  Jackson Latino, MD, MD PCCM service Pager [phone] 01/24/2018; 15;15

## 2018-01-27 ENCOUNTER — Inpatient Hospital Stay: Payer: Medicaid Other

## 2018-01-27 DIAGNOSIS — J9610 Chronic respiratory failure, unspecified whether with hypoxia or hypercapnia: Secondary | ICD-10-CM

## 2018-01-27 LAB — BASIC METABOLIC PANEL
Anion gap: 6 (ref 5–15)
BUN: 20 mg/dL (ref 6–20)
CHLORIDE: 98 mmol/L — AB (ref 101–111)
CO2: 38 mmol/L — AB (ref 22–32)
Calcium: 8.2 mg/dL — ABNORMAL LOW (ref 8.9–10.3)
Creatinine, Ser: 0.79 mg/dL (ref 0.61–1.24)
GFR calc non Af Amer: >60 mL/min (ref >60)
Glucose, Bld: 77 mg/dL (ref 65–99)
POTASSIUM: 4 mmol/L (ref 3.5–5.1)
Sodium: 142 mmol/L (ref 135–145)

## 2018-01-27 LAB — CBC WITH DIFFERENTIAL/PLATELET
BAND NEUTROPHILS: 0 %
BLASTS: 0 %
Basophils Absolute: 0 10*3/uL (ref 0–0.1)
Basophils Relative: 0 %
EOS ABS: 0 10*3/uL (ref 0–0.7)
Eosinophils Relative: 0 %
HEMATOCRIT: 25.6 % — AB (ref 40.0–52.0)
HEMOGLOBIN: 8.3 g/dL — AB (ref 13.0–18.0)
LYMPHS PCT: 22 %
Lymphs Abs: 2.5 10*3/uL (ref 1.0–3.6)
MCH: 27.3 pg (ref 26.0–34.0)
MCHC: 32.5 g/dL (ref 32.0–36.0)
MCV: 84.1 fL (ref 80.0–100.0)
METAMYELOCYTES PCT: 1 %
Monocytes Absolute: 1.3 10*3/uL — ABNORMAL HIGH (ref 0.2–1.0)
Monocytes Relative: 11 %
Myelocytes: 1 %
Neutro Abs: 7.6 10*3/uL — ABNORMAL HIGH (ref 1.4–6.5)
Neutrophils Relative %: 65 %
Other: 0 %
PROMYELOCYTES RELATIVE: 0 %
Platelets: 205 10*3/uL (ref 150–440)
RBC: 3.05 MIL/uL — AB (ref 4.40–5.90)
RDW: 18 % — ABNORMAL HIGH (ref 11.5–14.5)
WBC: 11.4 10*3/uL — AB (ref 3.8–10.6)
nRBC: 2 /100 WBC — ABNORMAL HIGH

## 2018-01-27 LAB — GLUCOSE, CAPILLARY
GLUCOSE-CAPILLARY: 95 mg/dL (ref 65–99)
GLUCOSE-CAPILLARY: 93 mg/dL (ref 65–99)
GLUCOSE-CAPILLARY: 177 mg/dL — AB (ref 65–99)
Glucose-Capillary: 166 mg/dL — ABNORMAL HIGH (ref 65–99)
Glucose-Capillary: 180 mg/dL — ABNORMAL HIGH (ref 65–99)
Glucose-Capillary: 118 mg/dL — ABNORMAL HIGH (ref 65–99)

## 2018-01-27 LAB — MAGNESIUM: Magnesium: 1.7 mg/dL (ref 1.7–2.4)

## 2018-01-27 MED ORDER — MIDAZOLAM HCL 2 MG/2ML IJ SOLN
INTRAMUSCULAR | Status: AC
  Filled 2018-01-27: qty 2

## 2018-01-27 MED ORDER — METOPROLOL TARTRATE 5 MG/5ML IV SOLN
INTRAVENOUS | Status: AC
  Administered 2018-01-27: 5 mg
  Filled 2018-01-27: qty 5

## 2018-01-27 MED ORDER — METOCLOPRAMIDE HCL 10 MG PO TABS
10.0000 mg | ORAL_TABLET | Freq: Two times a day (BID) | ORAL | Status: DC
  Administered 2018-01-27 – 2018-02-10 (×30): 10 mg via ORAL
  Filled 2018-01-27 (×32): qty 1

## 2018-01-27 MED ORDER — FUROSEMIDE 10 MG/ML IJ SOLN
INTRAMUSCULAR | Status: AC
  Filled 2018-01-27: qty 2

## 2018-01-27 MED ORDER — FUROSEMIDE 10 MG/ML IJ SOLN
20.0000 mg | Freq: Once | INTRAMUSCULAR | Status: AC
  Administered 2018-01-27: 20 mg via INTRAVENOUS

## 2018-01-27 MED ORDER — MIDAZOLAM HCL 2 MG/2ML IJ SOLN
2.0000 mg | Freq: Once | INTRAMUSCULAR | Status: AC
  Administered 2018-01-27: 2 mg via INTRAVENOUS

## 2018-01-27 MED ORDER — DILTIAZEM HCL 25 MG/5ML IV SOLN
10.0000 mg | Freq: Once | INTRAVENOUS | Status: AC
  Administered 2018-01-27: 10 mg via INTRAVENOUS
  Filled 2018-01-27: qty 5

## 2018-01-27 MED ORDER — NYSTATIN 100000 UNIT/ML MT SUSP
5.0000 mL | Freq: Four times a day (QID) | OROMUCOSAL | Status: DC
  Administered 2018-01-27 – 2018-03-19 (×190): 500000 [IU] via ORAL
  Filled 2018-01-27 (×193): qty 5

## 2018-01-27 MED ORDER — RIVAROXABAN 10 MG PO TABS
10.0000 mg | ORAL_TABLET | Freq: Every day | ORAL | Status: DC
Start: 2018-01-27 — End: 2018-01-30
  Administered 2018-01-27 – 2018-01-30 (×4): 10 mg
  Filled 2018-01-27 (×5): qty 1

## 2018-01-27 MED ORDER — METOPROLOL TARTRATE 5 MG/5ML IV SOLN
5.0000 mg | Freq: Once | INTRAVENOUS | Status: AC
  Administered 2018-01-27: 5 mg via INTRAVENOUS

## 2018-01-27 MED ORDER — DILTIAZEM LOAD VIA INFUSION: 10.0000 mg | Freq: Once | INTRAVENOUS | Status: DC

## 2018-01-27 NOTE — Progress Notes (Addendum)
Late note Patient anxious and moving around in bed. B/P 160/99. Patient given Apresoline 20 mg IVP as ordered per prn for elevated B/P. 0821 heart rate increased to 206. Then moments later heart rate 180-190s. Attempted deep suctioning to produce valsalva reaction without results. 7829 patient given Metoprolol 5 mg IVP per verbal order of Dr. Duanne Limerick. Heart rate remained rapid. Dr. Duanne Limerick in to view patient. Rythum identified as A.Fib.RVR per Dr. Duanne Limerick. 5621 Cardizem 10 mg IVP given after received from pharmacy. 1045 Patient now dys synchronous with the ventilator and oxygen saturations are in the 80s.  Placed on rate on the ventilator. 1055 remains dyssynchronios with ventilator.Given Versed 2 mgs and Lasix 20 mgs per orders. 1100 heart rate down to 111. Patient calmer and more ventilator synchronous. 1200 heart rate slower decreasing,B/P better, and patient calmer.

## 2018-01-27 NOTE — Progress Notes (Signed)
Patient ID: Tommy Mathews, male   DOB: 10-Nov-1960, 57 y.o.   MRN: [id number]  [employer] PROGRESS NOTE  Tommy Mathews YWV:[id number] DOB: [birth year] DOA: 01/06/2018 PCP: Patient, [name]  HPI/Subjective: Patient is more alert today.  Trying to talk even though he is being ventilated with support via the trach.  Able to follow some simple commands today.  Objective: Vitals:   01/27/18 1201 01/27/18 1405  BP:  104/60  Pulse:    Resp:    Temp:    SpO2: 95%     Filed Weights   01/19/18 0347 01/20/18 1200 01/26/18 0418  Weight: (!) 136.7 kg (301 lb 5.9 oz) (!) 139.5 kg (307 lb 8.7 oz) 133.2 kg (293 lb 10.4 oz)    ROS: Review of Systems  Unable to perform ROS: Acuity of condition   Exam: Physical Exam  HENT:  Nose: No mucosal edema.  Eyes: Pupils are equal, round, and reactive to light. Conjunctivae and lids are normal.  Neck: Carotid bruit is not present. No thyroid mass present.  Cardiovascular: Regular rhythm, S1 normal, S2 normal and normal heart sounds.  Respiratory: He has no decreased breath sounds. He has no wheezes. He has no rhonchi. He has no rales.  GI: Soft. Bowel sounds are normal. There is tenderness.  Genitourinary:  Genitourinary Comments: 4+ scrotal edema, penile edema  Musculoskeletal:       Right ankle: He exhibits swelling.       Left ankle: He exhibits swelling.  Neurological:  Sedated  Skin: Skin is warm. No rash noted. Nails show no clubbing.  Psychiatric:  On sedation      Data Reviewed: Basic Metabolic Panel: Recent Labs  Lab 01/23/18 0422 01/24/18 0443 01/25/18 0531 01/26/18 0447 01/27/18 0455  NA 143 142 142 142 142  K 4.5 4.0 3.8 4.1 4.0  CL 106 105 103 101 98*  CO2 35* 35* 35* 37* 38*  GLUCOSE 141* 139* 186* 104* 77  BUN 31* 28* 23* 21* 20  CREATININE 0.77 0.80 0.64 0.60* 0.79  CALCIUM 7.9* 8.0* 8.1* 8.1* 8.2*  MG  --   --  1.5* 1.7 1.7  PHOS  --   --  3.6 3.5  --    Liver Function Tests: Recent Labs  Lab  01/21/18 0500 01/26/18 0447  AST 30 23  ALT 30 19  ALKPHOS 96 74  BILITOT 0.5 0.3  PROT 4.7* 5.2*  ALBUMIN 1.7* 1.6*   CBC: Recent Labs  Lab 01/21/18 0500 01/22/18 0407 01/23/18 0422 01/26/18 0447 01/27/18 0455  WBC 13.5* 16.5* 15.0* 8.9 11.4*  NEUTROABS  --   --   --  5.9 7.6*  HGB 8.3* 9.2* 9.4* 8.4* 8.3*  HCT 23.8* 29.2* 29.7* 25.5* 25.6*  MCV 86.0 85.7 86.7 84.9 84.1  PLT 155 189 169 162 205   BNP (last 3 results) Recent Labs    01/06/18 1944 01/16/18 2332  BNP 37.0 402.0*     CBG: Recent Labs  Lab 01/26/18 1945 01/26/18 2341 01/27/18 0339 01/27/18 0751 01/27/18 1140  GLUCAP 115* 224* 95 93 166*    Recent Results (from the past 240 hour(s))  Culture, expectorated sputum-assessment     Status: None   Collection Time: 01/18/18  5:07 PM  Result Value Ref Range Status   Specimen Description ENDOTRACHEAL  Final   Special Requests NONE  Final   Sputum evaluation   Final    THIS SPECIMEN IS ACCEPTABLE FOR SPUTUM CULTURE Performed at [facility], [place]  [address], [place], [place] [address]    Report Status 01/18/2018 FINAL  Final  Culture, respiratory (NON-Expectorated)     Status: None   Collection Time: 01/18/18  5:07 PM  Result Value Ref Range Status   Specimen Description   Final    ENDOTRACHEAL Performed at [facility], [address] [place], [place] [address]    Special Requests   Final    NONE Reflexed from [id number] Performed at [facility], [address] [place], [place] [address]    Gram Stain   Final    MODERATE WBC PRESENT,BOTH PMN AND MONONUCLEAR NO SQUAMOUS EPITHELIAL CELLS SEEN RARE BUDDING YEAST SEEN    Culture   Final    RARE Consistent with normal respiratory flora. Performed at [facility], [place] [address], [place], [place] [address]    Report Status 01/21/2018 FINAL  Final  MRSA PCR Screening     Status: Abnormal   Collection Time: 01/18/18  8:26 PM  Result Value Ref Range Status    MRSA by PCR POSITIVE (A) NEGATIVE Final    Comment:        The GeneXpert MRSA Assay (FDA approved for NASAL specimens only), is one component of a comprehensive MRSA colonization surveillance program. It is not intended to diagnose MRSA infection nor to guide or monitor treatment for MRSA infections. RESULT CALLED TO, READ BACK BY AND VERIFIED WITH: ANGELA BREHUN AT 2200 ON 01/18/18 RWW Performed at [facility], [address] [place], [place] [address]      Studies: Dg Abd 1 View  Result Date: 01/27/2018 CLINICAL DATA:  Ileus EXAM: ABDOMEN - 1 VIEW COMPARISON:  01/24/2018 FINDINGS: Diffuse gaseous distention of bowel again noted compatible with ileus, unchanged. No free air or organomegaly. No acute bony abnormality. IMPRESSION: Stable ileus pattern. Electronically Signed   By: Rolm Baptise M.D.   On: 01/27/2018 07:51    Scheduled Meds: . amiodarone  200 mg Per Tube Daily  . budesonide (PULMICORT) nebulizer solution  0.5 mg Nebulization BID  . chlorhexidine gluconate (MEDLINE KIT)  15 mL Mouth Rinse BID  . clonazePAM  0.5 mg Per Tube BID  . feeding supplement (PRO-STAT SUGAR FREE 64)  30 mL Per Tube TID  . fentaNYL  75 mcg Transdermal Q72H  . free water  200 mL Per Tube Q8H  . hydrochlorothiazide  25 mg Oral Daily  . insulin aspart  0-20 Units Subcutaneous Q4H  . insulin glargine  20 Units Subcutaneous QHS  . ipratropium-albuterol  3 mL Nebulization Q6H  . lisinopril  5 mg Per Tube Daily  . mouth rinse  15 mL Mouth Rinse 10 times per day  . metoCLOPramide  10 mg Oral Q12H  . metoprolol tartrate  2.5 mg Intravenous STAT  . metoprolol tartrate  12.5 mg Per Tube TID  . multivitamin  15 mL Per Tube Daily  . nystatin  5 mL Oral QID  . pantoprazole sodium  40 mg Per Tube Daily  . QUEtiapine  100 mg Per Tube BID  . rivaroxaban  10 mg Per Tube Daily  . senna-docusate  2 tablet Per Tube BID  . Valproate Sodium  1,000 mg Per Tube BID   Continuous Infusions: .  feeding supplement (VITAL 1.5 CAL) 45 mL/hr at 01/26/18 1945    Assessment/Plan:  1. Acute hypoxic respiratory failure.  Patient difficult to wean and is now trached.  Currently on 40% FiO2.  Patient breathing rapid and shallow.  Likely  will need long-term weaning.  As per care manager note the patient insurance will not pay for LTAC.  Will likely need a long-term facility that can do long-term weaning on ventilator. 2. Atrial fibrillation with rapid ventricular response.  This morning had heart rates that were persistent in the 190s.  Patient on amiodarone and metoprolol. 3. Acute encephalopathy.  Patient able to follow some more commands today than previous.  CT scan of the head ordered. 4. Septic shock secondary to pneumonia and urinary tract infection.  Patient is off vasopressors and finished antibiotics. 5. Gastrointestinal bleed.  Hemoglobin stable on PPI.  Endoscopy negative. 6. Atrial fibrillation with rapid ventricular response.  Now in normal sinus rhythm on oral amiodarone and metoprolol. 7. Acute kidney injury resolved. 8. Anasarca with scrotal edema.  Consider switching hydrochlorothiazide over to Lasix. 9. Type 2 diabetes mellitus on glargine insulin and sliding scale 10. Nutrition on PEG tube  feeding 11. Depression on Prozac and Depakene and Seroquel. 12. Thrush on nystatin swish and swallow  Code Status:     Code Status Orders  (From admission, onward)        Start     Ordered   01/06/18 2227  Full code  Continuous     01/06/18 2226    Code Status History    This patient has a current code status but no historical code status.     Family Communication:  as per critical care specialist Disposition Plan:  will need long-term placement on a facility that can handle  a vent  Consultants:  Critical care specialist  Time spent: 26 minutes  [place]

## 2018-01-27 NOTE — Clinical Social Work Note (Signed)
CSW spoke with patient's mother Tommy Mathews, [phone] to discuss Vent SNF placement.  CSW explained to her that there is only one in [place], called [facility] which is in [place], and the other one is [facility] in [place], [place].  Patient's mother stated she hopes that if patient needs to go to a vent snf, he can go to [facility].  CSW updated patient's mother and informed her that social work will continue to follow patient's progress, and see how patient does.  She confirmed patient was at [facility] before he was admitted to the hospital.  Social work to continue to follow patient's progress throughout discharge planning.  Ervin Knack. Latrice Storlie, MSW, [facility]resia Majors [phone]  01/27/2018 8:06 PM

## 2018-01-27 NOTE — Care Management (Signed)
RNCM consult for LTAC. Patient has Medicaid which will not pay for LTAC.

## 2018-01-27 NOTE — Progress Notes (Addendum)
Calm day after A Fib RVR episode.Sister in to visit. After talking with family and nurse from 4/25, CT cancelled - no new drooping of left side.

## 2018-01-27 NOTE — Progress Notes (Signed)
Name: Tommy Mathews MRN: [id number] DOB: [birth year]     CONSULTATION DATE: 01/06/2018 Subjective & Objective: Restless, A fib with RVR, Flex-seal in place.  PAST MEDICAL HISTORY :   has a past medical history of Diabetes (HCC), GERD (gastroesophageal reflux disease), HLD (hyperlipidemia), and HTN (hypertension).  has a past surgical history that includes PEG placement (N/A, 01/21/2018) and Tracheostomy tube placement (N/A, 01/23/2018). Prior to Admission medications   Medication Sig Start Date End Date Taking? Authorizing Provider  atorvastatin (LIPITOR) 80 MG tablet Take 80 mg by mouth at bedtime.   Yes [provider]  divalproex (DEPAKOTE ER) 500 MG 24 hr tablet Take 500 mg by mouth 2 (two) times daily.   Yes [provider]  estradiol (ESTRACE) 2 MG tablet Take 2 mg by mouth daily.   Yes [provider]  FLUoxetine (PROZAC) 40 MG capsule Take 40 mg by mouth daily.   Yes [provider]  folic acid (FOLVITE) 800 MCG tablet Take 800 mcg by mouth daily.   Yes [provider]  hydrochlorothiazide (HYDRODIURIL) 50 MG tablet Take 50 mg by mouth daily.   Yes [provider]  lisinopril (PRINIVIL,ZESTRIL) 5 MG tablet Take 5 mg by mouth daily.   Yes [provider]  metFORMIN (GLUCOPHAGE) 1000 MG tablet Take 1,000 mg by mouth 2 (two) times daily with a meal.   Yes [provider]  methotrexate (RHEUMATREX) 2.5 MG tablet Take 15 mg by mouth once a week.   Yes [provider]  metoprolol tartrate (LOPRESSOR) 50 MG tablet Take 50 mg by mouth 2 (two) times daily.   Yes [provider]  Omega-3 Fatty Acids (FISH OIL) 1000 MG CAPS Take 1,000 mg by mouth daily.   Yes [provider]  QUEtiapine Fumarate (SEROQUEL PO) Take 500 mg by mouth 2 (two) times daily.   Yes [provider]  ranitidine (ZANTAC) 150 MG tablet Take 150 mg by mouth daily.   Yes [provider]  rivaroxaban (XARELTO) 10  MG TABS tablet Take 10 mg by mouth daily.   Yes [provider]  senna-docusate (SENOKOT-S) 8.6-50 MG tablet Take 2 tablets by mouth at bedtime.   Yes [provider]  sodium chloride 1 g tablet Take 1 g by mouth daily.   Yes [provider]   No Known Allergies  FAMILY HISTORY:  family history is not on file. SOCIAL HISTORY:  reports that he has been smoking cigarettes.  He started smoking about 19 years ago. He has a 40.00 pack-year smoking history. He has never used smokeless tobacco. He reports that he does not drink alcohol or use drugs.  REVIEW OF SYSTEMS:   Unable to obtain due to critical illness   VITAL SIGNS: Temp:  [98.6 F (37 C)-99.3 F (37.4 C)] 99 F (37.2 C) (04/29 0603) Pulse Rate:  [70-187] 103 (04/29 0603) Resp:  [14-31] 23 (04/29 0603) BP: (101-179)/(58-111) 160/99 (04/29 0817) SpO2:  [91 %-100 %] 99 % (04/29 0603) FiO2 (%):  [40 %] 40 % (04/29 0717)  Physical Examination:  Awake, restless, moving extremities and not following commands Trach in place on vent and no distress, BEAE and diffuse medium crackles S1 & S2 audible and no murmur Distended abdomen with feeble peristalses Bil leg edema 1 +   ASSESSMENT / PLAN:  Chronic respiratory failure tolerating TC for 2 h -Vent and trach weaning as tolerated.  A fib with RVR, CHF with dilated cardiomyopathy with LV EF 20% -Rate  control and resume anticoagulation -Gentl diuresis.   Debilitation with history of TBI and psychatric illness. Subtherapeutic Valproic acid -Optimize meds and monitor level.  Pneumonia with aspiration -Completed ABX. -Monitor CXR + CBC + FIO2.  HTN -Optimize antihypertensives and monitor hemodynamics  DM -Glycemic control  AKI (resolved) -Monitor renalpanle  Fecal impaction and constipation (resolved). Ileus on KUB -Senokot-S -Reglan  Anemia -Keep HB > 7 gm/dl.   Dysphagia -PEG for feeding  Full code  DVT & GI Prophylaxis. Continue  with supportive care  Critical care time 30 min

## 2018-01-28 ENCOUNTER — Inpatient Hospital Stay: Payer: Medicaid Other

## 2018-01-28 LAB — CBC WITH DIFFERENTIAL/PLATELET
BAND NEUTROPHILS: 4 %
BASOS ABS: 0.1 10*3/uL (ref 0–0.1)
BASOS PCT: 1 %
Blasts: 0 %
EOS ABS: 0.4 10*3/uL (ref 0–0.7)
EOS PCT: 3 %
HCT: 23.1 % — ABNORMAL LOW (ref 40.0–52.0)
Hemoglobin: 7.4 g/dL — ABNORMAL LOW (ref 13.0–18.0)
LYMPHS ABS: 2.1 10*3/uL (ref 1.0–3.6)
Lymphocytes Relative: 18 %
MCH: 27.1 pg (ref 26.0–34.0)
MCHC: 32 g/dL (ref 32.0–36.0)
MCV: 84.6 fL (ref 80.0–100.0)
METAMYELOCYTES PCT: 5 %
MONO ABS: 1.1 10*3/uL — AB (ref 0.2–1.0)
MONOS PCT: 9 %
MYELOCYTES: 1 %
NEUTROS ABS: 8 10*3/uL — AB (ref 1.4–6.5)
Neutrophils Relative %: 59 %
Other: 0 %
PLATELETS: 200 10*3/uL (ref 150–440)
Promyelocytes Relative: 0 %
RBC: 2.73 MIL/uL — ABNORMAL LOW (ref 4.40–5.90)
RDW: 18.4 % — AB (ref 11.5–14.5)
WBC: 11.7 10*3/uL — ABNORMAL HIGH (ref 3.8–10.6)
nRBC: 0 /100 WBC

## 2018-01-28 LAB — BASIC METABOLIC PANEL
ANION GAP: 4 — AB (ref 5–15)
BUN: 22 mg/dL — ABNORMAL HIGH (ref 6–20)
CALCIUM: 8.1 mg/dL — AB (ref 8.9–10.3)
CO2: 40 mmol/L — ABNORMAL HIGH (ref 22–32)
Chloride: 96 mmol/L — ABNORMAL LOW (ref 101–111)
Creatinine, Ser: 0.76 mg/dL (ref 0.61–1.24)
GLUCOSE: 191 mg/dL — AB (ref 65–99)
POTASSIUM: 3.5 mmol/L (ref 3.5–5.1)
SODIUM: 140 mmol/L (ref 135–145)

## 2018-01-28 LAB — GLUCOSE, CAPILLARY
GLUCOSE-CAPILLARY: 169 mg/dL — AB (ref 65–99)
GLUCOSE-CAPILLARY: 134 mg/dL — AB (ref 65–99)
Glucose-Capillary: 140 mg/dL — ABNORMAL HIGH (ref 65–99)
Glucose-Capillary: 146 mg/dL — ABNORMAL HIGH (ref 65–99)
Glucose-Capillary: 114 mg/dL — ABNORMAL HIGH (ref 65–99)

## 2018-01-28 LAB — HEMOGLOBIN A1C
Hgb A1c MFr Bld: 7.4 % — ABNORMAL HIGH (ref 4.8–5.6)
Mean Plasma Glucose: 165.68 mg/dL

## 2018-01-28 LAB — APTT: APTT: 45 s — AB (ref 24–36)

## 2018-01-28 LAB — PROTIME-INR
INR: 1.5
Prothrombin Time: 18 seconds — ABNORMAL HIGH (ref 11.4–15.2)

## 2018-01-28 LAB — MAGNESIUM: MAGNESIUM: 1.7 mg/dL (ref 1.7–2.4)

## 2018-01-28 LAB — VALPROIC ACID LEVEL: VALPROIC ACID LVL: 48 ug/mL — AB (ref 50.0–100.0)

## 2018-01-28 LAB — PHOSPHORUS: Phosphorus: 3.3 mg/dL (ref 2.5–4.6)

## 2018-01-28 MED ORDER — STERILE WATER FOR INJECTION IJ SOLN
INTRAMUSCULAR | Status: AC
  Administered 2018-01-28: 10 mL
  Filled 2018-01-28: qty 10

## 2018-01-28 MED ORDER — ALTEPLASE 2 MG IJ SOLR
2.0000 mg | Freq: Once | INTRAMUSCULAR | Status: AC
  Administered 2018-01-28: 2 mg
  Filled 2018-01-28: qty 2

## 2018-01-28 MED ORDER — IPRATROPIUM-ALBUTEROL 0.5-2.5 (3) MG/3ML IN SOLN
3.0000 mL | Freq: Three times a day (TID) | RESPIRATORY_TRACT | Status: DC
  Administered 2018-01-28 – 2018-02-07 (×29): 3 mL via RESPIRATORY_TRACT
  Filled 2018-01-28 (×29): qty 3

## 2018-01-28 MED ORDER — AMIODARONE HCL 200 MG PO TABS: 200.0000 mg | ORAL_TABLET | Freq: Two times a day (BID) | ORAL | Status: DC

## 2018-01-28 MED ORDER — AMIODARONE IV BOLUS ONLY 150 MG/100ML
150.0000 mg | Freq: Once | INTRAVENOUS | Status: AC
  Administered 2018-01-28: 150 mg via INTRAVENOUS

## 2018-01-28 MED ORDER — GLUCERNA 1.5 CAL PO LIQD
1000.0000 mL | ORAL | Status: DC
  Administered 2018-01-28 – 2018-02-09 (×13): 1000 mL

## 2018-01-28 MED ORDER — ACETYLCYSTEINE 20 % IN SOLN
3.0000 mL | Freq: Three times a day (TID) | RESPIRATORY_TRACT | Status: DC
  Administered 2018-01-28 – 2018-02-02 (×17): 3 mL via RESPIRATORY_TRACT
  Administered 2018-02-03: 4 mL via RESPIRATORY_TRACT
  Filled 2018-01-28 (×19): qty 4

## 2018-01-28 MED ORDER — AMIODARONE HCL 200 MG PO TABS
200.0000 mg | ORAL_TABLET | Freq: Two times a day (BID) | ORAL | Status: DC
  Administered 2018-01-28: 200 mg
  Filled 2018-01-28: qty 1

## 2018-01-28 MED ORDER — AMIODARONE IV BOLUS ONLY 150 MG/100ML
INTRAVENOUS | Status: AC
  Administered 2018-01-28: 150 mg via INTRAVENOUS
  Filled 2018-01-28: qty 100

## 2018-01-28 NOTE — Progress Notes (Signed)
RN made Dr. Duanne Limerick aware that patient's PICC line is very difficult to flush in 2 lumens and very slow to flush in the third lumen and barely gives blood return.  Dr. Duanne Limerick gave order to pack all 3 lumens with cathflo activase.

## 2018-01-28 NOTE — Progress Notes (Signed)
Dr. Duanne Limerick at bedside and RN made MD aware that patient did well with heart rate after amiodarone bolus was given earlier in shift but has just started having runs of SVT again.  MD gave order to give amiodarone 200 mg per tube BID starting now with 1st dose.

## 2018-01-28 NOTE — Progress Notes (Signed)
Patient ID: Tommy Mathews, male   DOB: 10-Apr-1961, 57 y.o.   MRN: [id number]  [facility] PROGRESS NOTE  Andrea Colglazier ZJI:[id number] DOB: December 14, 1960 DOA: 01/06/2018 PCP: Patient, [name]  HPI/Subjective: Patient a little more lethargic today.  As per nursing staff having episodes of SVT.  Does follow me with his eyes but did not follow commands and move his extremities for me today.  Objective: Vitals:   01/28/18 1242 01/28/18 1300  BP: (!) 80/60 115/67  Pulse: 94 79  Resp: 16 16  Temp: 99 F (37.2 C) 99 F (37.2 C)  SpO2: 94% 95%    Filed Weights   01/19/18 0347 01/20/18 1200 01/26/18 0418  Weight: (!) 136.7 kg (301 lb 5.9 oz) (!) 139.5 kg (307 lb 8.7 oz) 133.2 kg (293 lb 10.4 oz)    ROS: Review of Systems  Unable to perform ROS: Acuity of condition   Exam: Physical Exam  Constitutional: He appears lethargic.  HENT:  Nose: No mucosal edema.  Eyes: Pupils are equal, round, and reactive to light. Conjunctivae and lids are normal.  Neck: Carotid bruit is not present. No thyroid mass present.  Cardiovascular: Regular rhythm, S1 normal, S2 normal and normal heart sounds.  Respiratory: He has no decreased breath sounds. He has no wheezes. He has no rhonchi. He has no rales.  GI: Soft. Bowel sounds are normal. There is tenderness.  Genitourinary:  Genitourinary Comments: 4+ scrotal edema, penile edema  Musculoskeletal:       Right ankle: He exhibits swelling.       Left ankle: He exhibits swelling.  Neurological: He appears lethargic.  Sedated  Skin: Skin is warm. No rash noted. Nails show no clubbing.  Psychiatric: His affect is blunt.      Data Reviewed: Basic Metabolic Panel: Recent Labs  Lab 01/24/18 0443 01/25/18 0531 01/26/18 0447 01/27/18 0455 01/28/18 0401  NA 142 142 142 142 140  K 4.0 3.8 4.1 4.0 3.5  CL 105 103 101 98* 96*  CO2 35* 35* 37* 38* 40*  GLUCOSE 139* 186* 104* 77 191*  BUN 28* 23* 21* 20 22*  CREATININE 0.80 0.64 0.60* 0.79 0.76   CALCIUM 8.0* 8.1* 8.1* 8.2* 8.1*  MG  --  1.5* 1.7 1.7 1.7  PHOS  --  3.6 3.5  --  3.3   Liver Function Tests: Recent Labs  Lab 01/26/18 0447  AST 23  ALT 19  ALKPHOS 74  BILITOT 0.3  PROT 5.2*  ALBUMIN 1.6*   CBC: Recent Labs  Lab 01/22/18 0407 01/23/18 0422 01/26/18 0447 01/27/18 0455 01/28/18 0401  WBC 16.5* 15.0* 8.9 11.4* 11.7*  NEUTROABS  --   --  5.9 7.6* 8.0*  HGB 9.2* 9.4* 8.4* 8.3* 7.4*  HCT 29.2* 29.7* 25.5* 25.6* 23.1*  MCV 85.7 86.7 84.9 84.1 84.6  PLT 189 169 162 205 200   BNP (last 3 results) Recent Labs    01/06/18 1944 01/16/18 2332  BNP 37.0 402.0*     CBG: Recent Labs  Lab 01/27/18 1920 01/27/18 2321 01/28/18 0328 01/28/18 0809 01/28/18 1228  GLUCAP 180* 118* 169* 140* 146*    Recent Results (from the past 240 hour(s))  Culture, expectorated sputum-assessment     Status: None   Collection Time: 01/18/18  5:07 PM  Result Value Ref Range Status   Specimen Description ENDOTRACHEAL  Final   Special Requests NONE  Final   Sputum evaluation   Final    THIS SPECIMEN IS ACCEPTABLE FOR  SPUTUM CULTURE Performed at [facility], [address] [place], [place] [address]    Report Status 01/18/2018 FINAL  Final  Culture, respiratory (NON-Expectorated)     Status: None   Collection Time: 01/18/18  5:07 PM  Result Value Ref Range Status   Specimen Description   Final    ENDOTRACHEAL Performed at [facility], [address] [place], [place] [address]    Special Requests   Final    NONE Reflexed from [id number] Performed at [facility], [address] [place], [place] [address]    Gram Stain   Final    MODERATE WBC PRESENT,BOTH PMN AND MONONUCLEAR NO SQUAMOUS EPITHELIAL CELLS SEEN RARE BUDDING YEAST SEEN    Culture   Final    RARE Consistent with normal respiratory flora. Performed at [facility], [place] [address] [place], [place] [address]    Report Status 01/21/2018 FINAL  Final  MRSA PCR  Screening     Status: Abnormal   Collection Time: 01/18/18  8:26 PM  Result Value Ref Range Status   MRSA by PCR POSITIVE (A) NEGATIVE Final    Comment:        The GeneXpert MRSA Assay (FDA approved for NASAL specimens only), is one component of a comprehensive MRSA colonization surveillance program. It is not intended to diagnose MRSA infection nor to guide or monitor treatment for MRSA infections. RESULT CALLED TO, READ BACK BY AND VERIFIED WITH: ANGELA BREHUN AT 2200 ON 01/18/18 RWW Performed at [facility], [address] [place], [place] [address]      Studies: Dg Abd 1 View  Result Date: 01/27/2018 CLINICAL DATA:  Ileus EXAM: ABDOMEN - 1 VIEW COMPARISON:  01/24/2018 FINDINGS: Diffuse gaseous distention of bowel again noted compatible with ileus, unchanged. No free air or organomegaly. No acute bony abnormality. IMPRESSION: Stable ileus pattern. Electronically Signed   By: Rolm Baptise M.D.   On: 01/27/2018 07:51   Dg Chest Port 1 View  Result Date: 01/28/2018 CLINICAL DATA:  Pneumonia EXAM: PORTABLE CHEST 1 VIEW COMPARISON:  01/23/2018 FINDINGS: Tracheostomy and right PICC line remain in place, unchanged. Cardiomegaly. Vascular congestion and diffuse bilateral airspace opacities are again noted, unchanged. Low lung volumes. No visible effusions or acute bony abnormality. IMPRESSION: Cardiomegaly with vascular congestion and diffuse bilateral airspace disease which could reflect edema or infection. No change. Electronically Signed   By: Rolm Baptise M.D.   On: 01/28/2018 09:21    Scheduled Meds: . acetylcysteine  3 mL Nebulization Q8H  . alteplase  2 mg Intracatheter Once  . alteplase  2 mg Intracatheter Once  . alteplase  2 mg Intracatheter Once  . amiodarone  200 mg Per Tube Daily  . budesonide (PULMICORT) nebulizer solution  0.5 mg Nebulization BID  . chlorhexidine gluconate (MEDLINE KIT)  15 mL Mouth Rinse BID  . clonazePAM  0.5 mg Per Tube BID  . feeding  supplement (PRO-STAT SUGAR FREE 64)  30 mL Per Tube TID  . fentaNYL  75 mcg Transdermal Q72H  . free water  200 mL Per Tube Q8H  . hydrochlorothiazide  25 mg Oral Daily  . insulin aspart  0-20 Units Subcutaneous Q4H  . insulin glargine  20 Units Subcutaneous QHS  . ipratropium-albuterol  3 mL Nebulization Q8H  . lisinopril  5 mg Per Tube Daily  . mouth rinse  15 mL Mouth Rinse 10 times per day  . metoCLOPramide  10 mg Oral Q12H  . metoprolol  tartrate  12.5 mg Per Tube TID  . multivitamin  15 mL Per Tube Daily  . nystatin  5 mL Oral QID  . pantoprazole sodium  40 mg Per Tube Daily  . QUEtiapine  100 mg Per Tube BID  . rivaroxaban  10 mg Per Tube Daily  . senna-docusate  2 tablet Per Tube BID  . Valproate Sodium  1,000 mg Per Tube BID   Continuous Infusions: . feeding supplement (VITAL 1.5 CAL) 45 mL/hr at 01/28/18 0600    Assessment/Plan:  1. Acute hypoxic respiratory failure.  Patient difficult to wean and is now trached.  Currently on 40% FiO2.  Likely will need long-term weaning.  Will likely need a long-term facility that can do long-term weaning on ventilator. 2. Atrial fibrillation with rapid ventricular response, episodes of SVT.  Patient on amiodarone and metoprolol. 3. Acute encephalopathy.   history of traumatic brain injury. 4. Septic shock secondary to pneumonia and urinary tract infection.  Patient is off vasopressors and finished antibiotics. 5. Gastrointestinal bleed.  Hemoglobin stable on PPI.  Endoscopy negative. 6. Atrial fibrillation with rapid ventricular response.  Now in normal sinus rhythm on oral amiodarone and metoprolol. 7. Acute kidney injury resolved. 8. Anasarca with scrotal edema.  Consider switching hydrochlorothiazide over to Lasix. 9. Type 2 diabetes mellitus on glargine insulin and sliding scale 10. Nutrition on PEG tube  feeding 11. Depression on Prozac and Depakene and Seroquel. 12. Thrush on nystatin swish and swallow  Code Status:     Code  Status Orders  (From admission, onward)        Start     Ordered   01/06/18 2227  Full code  Continuous     01/06/18 2226    Code Status History    This patient has a current code status but no historical code status.     Family Communication:  as per critical care specialist Disposition Plan:  will need long-term placement on a facility that can handle a ventilator  Consultants:  Critical care specialist  Time spent: 25 minutes  [place]

## 2018-01-28 NOTE — NC FL2 (Signed)
[place] LEVEL OF CARE SCREENING TOOL     IDENTIFICATION  Patient Name: Tommy Mathews Birthdate: 1961-09-29 Sex: male Admission Date (Current Location): 01/06/2018  [place] and [place] Number:  [name] [id number] [place] and Address:  [facility], [address], [place], [place] [address]      Provider Number: [id number]  Attending Physician Name and Address:  Loletha Grayer, MD  Relative Name and Phone Number:  Iona Beard Mother [phone] or Dorthula Rue Sister   [phone] or Larinda Buttery Sister   [phone]     Current Level of Care: Hospital Recommended Level of Care: Vent SNF Prior Approval Number:    Date Approved/Denied:   PASRR Number:    Discharge Plan: SNF    Current Diagnoses: Patient Active Problem List   Diagnosis Date Noted  . Pressure injury of skin 01/18/2018  . GI bleed 01/06/2018  . Acute respiratory failure with hypoxia ([place]) 01/06/2018  . Severe sepsis with septic shock ([place]) 01/06/2018  . Multifocal pneumonia 01/06/2018  . UTI (urinary tract infection) 01/06/2018  . AKI (acute kidney injury) ([place]) 01/06/2018  . Ileus ([place]) 01/06/2018  . Diabetes ([place]) 01/06/2018    Orientation RESPIRATION BLADDER Height & Weight     Self, Time, Situation, Place  Vent   Weight: 293 lb 10.4 oz (133.2 kg) Height:  _0  (177.8 cm)  BEHAVIORAL SYMPTOMS/MOOD NEUROLOGICAL BOWEL NUTRITION STATUS        Feeding tube  AMBULATORY STATUS COMMUNICATION OF NEEDS Skin   Extensive Assist Verbally PU Stage and Appropriate Care   PU Stage 2 Dressing: (Every 3 days)                   Personal Care Assistance Level of Assistance  Bathing, Feeding, Dressing Bathing Assistance: Maximum assistance Feeding assistance: Maximum assistance Dressing Assistance: Maximum assistance     Functional Limitations Info  Sight, Hearing, Speech Sight Info: Adequate Hearing Info: Adequate Speech Info: Adequate     SPECIAL CARE FACTORS FREQUENCY                       Contractures Contractures Info: Not present    Additional Factors Info  Code Status, Allergies, Insulin Sliding Scale, Isolation Precautions, Psychotropic Code Status Info: Full Code Allergies Info: No Known Allergies  Psychotropic Info: QUEtiapine (SEROQUEL) tablet 100 mg  Insulin Sliding Scale Info: insulin aspart (novoLOG) injection 0-20 Units every 4 hours Isolation Precautions Info: Contact precautions for MRSA     Current Medications (01/28/2018):  This is the current hospital active medication list Current Facility-Administered Medications  Medication Dose Route Frequency Provider Last Rate Last Dose  . acetaminophen (TYLENOL) tablet 650 mg  650 mg Per Tube Q4H PRN Wilhelmina Mcardle, MD   650 mg at 01/28/18 0001  . acetylcysteine (MUCOMYST) 20 % nebulizer / oral solution 3 mL  3 mL Nebulization Q8H Samaan, Maged, MD      . amiodarone (PACERONE) tablet 200 mg  200 mg Per Tube Daily Wilhelmina Mcardle, MD   200 mg at 01/28/18 1027  . bisacodyl (DULCOLAX) suppository 10 mg  10 mg Rectal Daily PRN Wilhelmina Mcardle, MD   10 mg at 01/20/18 1048  . budesonide (PULMICORT) nebulizer solution 0.5 mg  0.5 mg Nebulization BID Wilhelmina Mcardle, MD   0.5 mg at 01/28/18 0756  . chlorhexidine gluconate (MEDLINE KIT) (PERIDEX) 0.12 % solution 15 mL  15 mL Mouth Rinse BID Wilhelmina Mcardle, MD  15 mL at 01/28/18 0808  . clonazePAM (KLONOPIN) tablet 0.5 mg  0.5 mg Per Tube BID Wilhelmina Mcardle, MD   0.5 mg at 01/28/18 1027  . feeding supplement (PRO-STAT SUGAR FREE 64) liquid 30 mL  30 mL Per Tube TID Wilhelmina Mcardle, MD   30 mL at 01/28/18 1025  . feeding supplement (VITAL 1.5 CAL) liquid 1,000 mL  1,000 mL Per Tube Continuous Wilhelmina Mcardle, MD 45 mL/hr at 01/28/18 0600    . fentaNYL (DURAGESIC - dosed mcg/hr) 75 mcg  75 mcg Transdermal Q72H [place] Dragon, MD   75 mcg at 01/26/18 1340  . free water 200 mL  200 mL Per Tube Q8H  Wilhelmina Mcardle, MD   200 mL at 01/28/18 0606  . hydrochlorothiazide (HYDRODIURIL) tablet 25 mg  25 mg Oral Daily Henreitta Leber, MD   25 mg at 01/28/18 1027  . insulin aspart (novoLOG) injection 0-20 Units  0-20 Units Subcutaneous Q4H Wilhelmina Mcardle, MD   3 Units at 01/28/18 0813  . insulin glargine (LANTUS) injection 20 Units  20 Units Subcutaneous QHS Wilhelmina Mcardle, MD   20 Units at 01/27/18 2259  . ipratropium-albuterol (DUONEB) 0.5-2.5 (3) MG/3ML nebulizer solution 3 mL  3 mL Nebulization Q8H Samaan, Maged, MD      . lisinopril (PRINIVIL,ZESTRIL) tablet 5 mg  5 mg Per Tube Daily Wilhelmina Mcardle, MD   5 mg at 01/28/18 1028  . MEDLINE mouth rinse  15 mL Mouth Rinse 10 times per day Wilhelmina Mcardle, MD   15 mL at 01/28/18 1027  . metoCLOPramide (REGLAN) tablet 10 mg  10 mg Oral Q12H Samaan, Maged, MD   10 mg at 01/28/18 1028  . metoprolol tartrate (LOPRESSOR) tablet 12.5 mg  12.5 mg Per Tube TID Wilhelmina Mcardle, MD   12.5 mg at 01/28/18 0816  . multivitamin liquid 15 mL  15 mL Per Tube Daily Wilhelmina Mcardle, MD   15 mL at 01/28/18 1026  . nystatin (MYCOSTATIN) 100000 UNIT/ML suspension 500,000 Units  5 mL Oral QID Loletha Grayer, MD   500,000 Units at 01/28/18 1034  . pantoprazole sodium (PROTONIX) 40 mg/20 mL oral suspension 40 mg  40 mg Per Tube Daily Wilhelmina Mcardle, MD   40 mg at 01/28/18 1031  . QUEtiapine (SEROQUEL) tablet 100 mg  100 mg Per Tube BID Wilhelmina Mcardle, MD   100 mg at 01/28/18 1028  . rivaroxaban (XARELTO) tablet 10 mg  10 mg Per Tube Daily Soyla Murphy, Maged, MD   10 mg at 01/28/18 1028  . senna-docusate (Senokot-S) tablet 2 tablet  2 tablet Per Tube BID Wilhelmina Mcardle, MD   2 tablet at 01/27/18 2258  . sodium chloride flush (NS) 0.9 % injection 10-40 mL  10-40 mL Intracatheter PRN Wilhelmina Mcardle, MD      . Valproate Sodium (DEPAKENE) solution 1,000 mg  1,000 mg Per Tube BID Wilhelmina Mcardle, MD   1,000 mg at 01/28/18 1026     Discharge Medications: Please  see discharge summary for a list of discharge medications.  Relevant Imaging Results:  Relevant Lab Results:   Additional Information SSN   Jazira Maloney, Jones Broom, LCSWA

## 2018-01-28 NOTE — Evaluation (Signed)
Physical Therapy Evaluation Patient Details Name: Tommy Mathews MRN: [id number] DOB: 10-Dec-1960 Today's Date: 01/28/2018   History of Present Illness  presented to ER (4/8) secondary to respiratory distress (requiring intubation in ED); admitted with severe sepsis, shock due to multifocal PNA (possible aspiration event), UTI.  Mod amount of coffee-ground material suctioned through OG tube initially; suspected with GIB.  Hospital course further significant for difficulty weaning and persistent ventilator dyssynchrony, requiring administration of vecuronium; afib; PEG placement (4/23), trach placement (4/25); intermittent afib with RVR (controlled with medication as needed).  Clinical Impression  Upon evaluation, patient alert with eyes open, visually tracking/attending to therapist throughout session, attempting to mouth words to communicate with therapist (though unintelligible).  Inconsistent yes/no responses (unreliable) and inconsistent commands following, though does attempt to actively participate with isolated movement of all extremities.  Globally weak and deconditioned throughout all extremities (2+ to 3-/5 throughout); requiring total care (+2) for all repositioning and mobility attempts.  Does appear to indicate L hip pain with act assist movement towards 90 degrees, improved with repositioning/less range. Will plan to schedule co-treat with OT next date to initiate/assess full bed mobility and unsupported sitting abilities. Would benefit from skilled PT to address above deficits and promote optimal return to PLOF; recommend transition to STR upon discharge from acute hospitalization (will likely need one capable of managing vent per team).     Follow Up Recommendations (vent SNF)    Equipment Recommendations       Recommendations for Other Services       Precautions / Restrictions Precautions Precautions: Fall Precaution Comments: NPO/PEG, trach/vent Restrictions Weight Bearing  Restrictions: No      Mobility  Bed Mobility Overal bed mobility: Needs Assistance Bed Mobility: Rolling Rolling: Total assist;+2 for physical assistance            Transfers                 General transfer comment: unsafe/unable  Ambulation/Gait             General Gait Details: unsafe/unable  Stairs            Wheelchair Mobility    Modified Rankin (Stroke Patients Only)       Balance                                             Pertinent Vitals/Pain Pain Assessment: Faces Faces Pain Scale: Hurts little more Pain Location: L hip? Pain Descriptors / Indicators: Grimacing Pain Intervention(s): Limited activity within patient's tolerance;Monitored during session;Repositioned    Home Living Family/patient expects to be discharged to:: Group home([facility] per chart)                      Prior Function           Comments: Patient unable to provide accurate social history; will verify with family/facility as available.     Hand Dominance        Extremity/Trunk Assessment   Upper Extremity Assessment Upper Extremity Assessment: (R UE grossly 2+/5, L UE grossly 3-/5; generally edematous throughout')    Lower Extremity Assessment Lower Extremity Assessment: (bilat LEs grossly at least 2+/5; increased strength/activation noted with spontaneous stretching/repositioning in bed (difficulty fully attending to formal MMT/ROM))       Communication   Communication: Tracheostomy  Cognition Arousal/Alertness: Awake/alert Behavior During  Therapy: Restless Overall Cognitive Status: Difficult to assess                                 General Comments: patient with vent/trach.  Does visuall track and attend to therapist throughout session; attempts to mouth words/communicate to therapist (unintelligible); unreliable/inconsistent yes/no responses; attempts to follow simple, verbal commands  approx 25-50% time      General Comments      Exercises Other Exercises Other Exercises: Supine UE/LE therex, act assist, 1x10: shoulder flex/ext/IR/ER, elbow flex/ext, forearm pronation/supination, wrist flex/ext and gross grasp/release; ankle pumps/circumduction, heels slides, hip abduct/adduct.  Mild facial grimacing with L hip flexion towards 90 degrees (improved with less range in subequent repetitions).  Prefers to rest L LE off edge of bed despite multiple re-positioning attempts.   Assessment/Plan    PT Assessment Patient needs continued PT services  PT Problem List Decreased strength;Decreased range of motion;Decreased activity tolerance;Decreased balance;Decreased mobility;Decreased coordination;Decreased cognition;Decreased knowledge of use of DME;Decreased safety awareness;Decreased knowledge of precautions;Cardiopulmonary status limiting activity;Obesity;Pain       PT Treatment Interventions DME instruction;Gait training;Functional mobility training;Therapeutic activities;Therapeutic exercise;Balance training;Neuromuscular re-education;Cognitive remediation;Patient/family education    PT Goals (Current goals can be found in the Care Plan section)  Acute Rehab PT Goals PT Goal Formulation: Patient unable to participate in goal setting Time For Goal Achievement: 02/11/18 Potential to Achieve Goals: Fair    Frequency Min 2X/week   Barriers to discharge Decreased caregiver support      Co-evaluation               AM-PAC PT "6 Clicks" Daily Activity  Outcome Measure Difficulty turning over in bed (including adjusting bedclothes, sheets and blankets)?: Unable Difficulty moving from lying on back to sitting on the side of the bed? : Unable Difficulty sitting down on and standing up from a chair with arms (e.g., wheelchair, bedside commode, etc,.)?: Unable Help needed moving to and from a bed to chair (including a wheelchair)?: Total Help needed walking in hospital  room?: Total Help needed climbing 3-5 steps with a railing? : Total 6 Click Score: 6    End of Session   Activity Tolerance: Patient tolerated treatment well Patient left: in bed;with call bell/phone within reach Nurse Communication: Mobility status PT Visit Diagnosis: Muscle weakness (generalized) (M62.81);Difficulty in walking, not elsewhere classified (R26.2);Pain Pain - Right/Left: Left Pain - part of body: Hip    Time: 0928-0950 PT Time Calculation (min) (ACUTE ONLY): 22 min   Charges:   PT Evaluation $PT Eval High Complexity: 1 High PT Treatments $Therapeutic Exercise: 8-22 mins   PT G Codes:        Longino Trefz H. [name], PT, DPT, NCS 01/28/18, 10:17 AM [phone]

## 2018-01-28 NOTE — Progress Notes (Signed)
RN spoke with Dr. Duanne Limerick and made MD aware that patient continues to have runs of SVT and right now heart rate is remaining in the 140's with Blood pressure of 80/60 and MAP of 68.  MD gave order for 150 mg amiodarone bolus.

## 2018-01-28 NOTE — Progress Notes (Signed)
Name: Diontae Route MRN: [id number] DOB: 01/08/1961     CONSULTATION DATE: 01/06/2018  Subjective & Objective: Tolerating CPAP/PS, had 2 BM last night and no major issues  PAST MEDICAL HISTORY :   has a past medical history of Diabetes (HCC), GERD (gastroesophageal reflux disease), HLD (hyperlipidemia), and HTN (hypertension).  has a past surgical history that includes PEG placement (N/A, 01/21/2018) and Tracheostomy tube placement (N/A, 01/23/2018). Prior to Admission medications   Medication Sig Start Date End Date Taking? Authorizing Provider  atorvastatin (LIPITOR) 80 MG tablet Take 80 mg by mouth at bedtime.   Yes [provider]  divalproex (DEPAKOTE ER) 500 MG 24 hr tablet Take 500 mg by mouth 2 (two) times daily.   Yes [provider]  estradiol (ESTRACE) 2 MG tablet Take 2 mg by mouth daily.   Yes [provider]  FLUoxetine (PROZAC) 40 MG capsule Take 40 mg by mouth daily.   Yes [provider]  folic acid (FOLVITE) 800 MCG tablet Take 800 mcg by mouth daily.   Yes [provider]  hydrochlorothiazide (HYDRODIURIL) 50 MG tablet Take 50 mg by mouth daily.   Yes [provider]  lisinopril (PRINIVIL,ZESTRIL) 5 MG tablet Take 5 mg by mouth daily.   Yes [provider]  metFORMIN (GLUCOPHAGE) 1000 MG tablet Take 1,000 mg by mouth 2 (two) times daily with a meal.   Yes [provider]  methotrexate (RHEUMATREX) 2.5 MG tablet Take 15 mg by mouth once a week.   Yes [provider]  metoprolol tartrate (LOPRESSOR) 50 MG tablet Take 50 mg by mouth 2 (two) times daily.   Yes [provider]  Omega-3 Fatty Acids (FISH OIL) 1000 MG CAPS Take 1,000 mg by mouth daily.   Yes [provider]  QUEtiapine Fumarate (SEROQUEL PO) Take 500 mg by mouth 2 (two) times daily.   Yes [provider]  ranitidine (ZANTAC) 150 MG tablet Take 150 mg by mouth daily.   Yes [provider]    rivaroxaban (XARELTO) 10 MG TABS tablet Take 10 mg by mouth daily.   Yes [provider]  senna-docusate (SENOKOT-S) 8.6-50 MG tablet Take 2 tablets by mouth at bedtime.   Yes [provider]  sodium chloride 1 g tablet Take 1 g by mouth daily.   Yes [provider]   No Known Allergies  FAMILY HISTORY:  family history is not on file. SOCIAL HISTORY:  reports that he has been smoking cigarettes.  He started smoking about 19 years ago. He has a 40.00 pack-year smoking history. He has never used smokeless tobacco. He reports that he does not drink alcohol or use drugs.  REVIEW OF SYSTEMS:   Unable to obtain due to critical illness   VITAL SIGNS: Temp:  [97.2 F (36.2 C)-99.9 F (37.7 C)] 99.9 F (37.7 C) (04/30 0900) Pulse Rate:  [74-111] 92 (04/30 0900) Resp:  [14-40] 26 (04/30 0900) BP: (104-174)/(59-106) 129/59 (04/30 0900) SpO2:  [87 %-99 %] 87 % (04/30 0900) FiO2 (%):  [40 %] 40 % (04/30 0800)  Physical Examination:  Awake, moving extremities and not following commands Trach in place on vent and no distress, BEAE and diffuse medium crackles with small thick grey secretions S1 & S2 audible and no murmur Distended abdomen with feeble peristalses No edema   ASSESSMENT / PLAN:   Chronic respiratory failure tolerating CPAP/PS 07/06/39%  -Vent and trach weaning as tolerated.  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 Subtherapeutic Valproic acid -Optimize meds and monitor level.  Pneumonia with aspiration. Improved bil. Airspace disease. -Completed ABX. -Monitor CXR + CBC + FIO2.\ -Started on Acetylcyst + Bronchodilators Neb because of thick respiratory secretions.  HTN -Optimize antihypertensives and monitor hemodynamics  DM -Glycemic control  AKI and mild prerenal azotemia. -Monitor renal panle  Fecal impaction and  constipation (resolved). Ileus on KUB -Senokot-S -Reglan  Anemia with HB drop 0.9 gm within the last 24 h on Xeralto. -Keep HB > 7 gm/dl.  Dysphagia -PEG for feeding  Full code  DVT & GI Prophylaxis. Continue with supportive care  Critical care time 35 min

## 2018-01-28 NOTE — Progress Notes (Signed)
RN made Dr. Duanne Limerick aware that patient just had a run of SVT 160-170's not sustained.  MD acknowledged and gave no new orders.

## 2018-01-28 NOTE — Progress Notes (Addendum)
Nutrition Follow-up  DOCUMENTATION CODES:   Obesity unspecified  INTERVENTION:  Initiate new goal TF regimen of Glucerna 1.5 Cal at 65 mL/hr (1560 mL goal daily volume). Provides 2340 kcal, 129 grams of protein, 25 grams of fiber, 1186 mL H2O daily.  With current free water flush of 200 mL Q8hrs patient is receiving a total of 1786 mL H2O daily including water in tube feeding.  Will discontinue Pro-Stat and liquid MVI per tube.  NUTRITION DIAGNOSIS:   Inadequate oral intake related to inability to eat as evidenced by NPO status.  Ongoing - addressing with TF regimen.  GOAL:   Provide needs based on ASPEN/SCCM guidelines  Met with TF regimen.  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  Patient is s/p tracheostomy tube placement on 4/25. Patient remains intubated through tracheostomy tube. Discharge planning underway. Per chart patient will not be able to go to [facility] as he has Medicaid. Team is looking into Vent SNF options. Patient is having regular bowel movements now.  Access: 20 Fr G-tube with C-clamp placed 4/23; placement verified by relook endoscopy; 4.5 cm at external bumper  MAP: 67-108 mmHg  TF: pt tolerating Vital 1.5 at 45 mL/hr + Pro-Stat 30 mL TID; per pump history patient received 972 mL tube feeds in the past 24 hrs (90% goal daily volume)  Patient is currently intubated on ventilator support Ve: 11.9 L/min Temp (24hrs), Avg:98.7 F (37.1 C), Min:97.2 F (36.2 C), Max:99.9 F (37.7 C)  Propofol:  N/A  Medications reviewed and include: amiodarone, clonazepam, fentanyl patch, free water flush 200 mL Q8hrs, hydrochlorothiazide 25 mg daily, Novolog 0-20 units Q4hrs (received 18 units past 24 hrs), Lantus 20 units daily, Reglan, liquid MVI daily, pantoprazole, Seroquel, Xarelto, senna-docusate, Depakene.  Labs reviewed: CBG 118-180 past 24 hrs, Chloride 96, CO2 40, BUN 22, Anion gap 4. HgbA1c 7.4.  I/O: 1750 mL UOP yesterday (0.5 mL/kg/hr); 2 occurrences stool output yesterday  Weight trend: 133.2 kg on 4/28; wt was starting to trend back down; -6.3 kg from 4/22 but still +25.8 kg from admission  Discussed with RN and on rounds.  Diet Order:   Diet Order    None      EDUCATION NEEDS:   Not appropriate for education at this time  Skin:  Skin Assessment: Skin Integrity Issues: Skin Integrity Issues:: Stage II, Other (Comment) Stage II: coccyx Other: open blister right thigh; MSAD to bilateral thighs  Last BM:  01/28/2018 - medium type 7  Height:   Ht Readings from Last 1 Encounters:  01/07/18 '5\' 10"'  (1.778 m)    Weight:   Wt Readings from Last 1 Encounters:  01/26/18 293 lb 10.4 oz (133.2 kg)    Ideal Body Weight:  75.5 kg  BMI:  Body mass index is 42.13 kg/m.  Estimated Nutritional Needs:   Kcal:  2295 (PSU 2003b w/ MSJ 1919, Ve 11.9, Tmax 37.7)  Protein:  113-135 grams (1.5-1.8 grams/kg IBW)  Fluid:  1.8 L/day  Willey Blade, MS, RD, LDN Office: [phone] Pager: [phone] After Hours/Weekend Pager: [phone]

## 2018-01-28 NOTE — Progress Notes (Signed)
Activase removed from all three lumens on picc line.  Blood returned from all three but continues to be difficult to flush.

## 2018-01-29 ENCOUNTER — Inpatient Hospital Stay: Payer: Medicaid Other

## 2018-01-29 LAB — BASIC METABOLIC PANEL
Anion gap: 5 (ref 5–15)
Anion gap: 7 (ref 5–15)
BUN: 16 mg/dL (ref 6–20)
BUN: 14 mg/dL (ref 6–20)
CALCIUM: 8.1 mg/dL — AB (ref 8.9–10.3)
CALCIUM: 8.2 mg/dL — AB (ref 8.9–10.3)
CHLORIDE: 92 mmol/L — AB (ref 101–111)
CO2: 37 mmol/L — AB (ref 22–32)
CO2: 39 mmol/L — ABNORMAL HIGH (ref 22–32)
CREATININE: 0.69 mg/dL (ref 0.61–1.24)
CREATININE: 0.7 mg/dL (ref 0.61–1.24)
Chloride: 95 mmol/L — ABNORMAL LOW (ref 101–111)
Glucose, Bld: 137 mg/dL — ABNORMAL HIGH (ref 65–99)
Glucose, Bld: 169 mg/dL — ABNORMAL HIGH (ref 65–99)
Potassium: 3.5 mmol/L (ref 3.5–5.1)
Potassium: 3.4 mmol/L — ABNORMAL LOW (ref 3.5–5.1)
SODIUM: 137 mmol/L (ref 135–145)
SODIUM: 138 mmol/L (ref 135–145)

## 2018-01-29 LAB — CBC WITH DIFFERENTIAL/PLATELET
BASOS PCT: 1 %
Basophils Absolute: 0.1 10*3/uL (ref 0–0.1)
EOS ABS: 0.2 10*3/uL (ref 0–0.7)
EOS PCT: 1 %
HEMATOCRIT: 24.4 % — AB (ref 40.0–52.0)
Hemoglobin: 7.9 g/dL — ABNORMAL LOW (ref 13.0–18.0)
Lymphocytes Relative: 15 %
Lymphs Abs: 1.7 10*3/uL (ref 1.0–3.6)
MCH: 27.5 pg (ref 26.0–34.0)
MCHC: 32.6 g/dL (ref 32.0–36.0)
MCV: 84.3 fL (ref 80.0–100.0)
MONO ABS: 1.5 10*3/uL — AB (ref 0.2–1.0)
Monocytes Relative: 13 %
NEUTROS ABS: 7.7 10*3/uL — AB (ref 1.4–6.5)
Neutrophils Relative %: 70 %
PLATELETS: 210 10*3/uL (ref 150–440)
RBC: 2.89 MIL/uL — ABNORMAL LOW (ref 4.40–5.90)
RDW: 18.6 % — AB (ref 11.5–14.5)
WBC: 11.1 10*3/uL — ABNORMAL HIGH (ref 3.8–10.6)

## 2018-01-29 LAB — GLUCOSE, CAPILLARY
GLUCOSE-CAPILLARY: 132 mg/dL — AB (ref 65–99)
GLUCOSE-CAPILLARY: 160 mg/dL — AB (ref 65–99)
GLUCOSE-CAPILLARY: 160 mg/dL — AB (ref 65–99)
Glucose-Capillary: 111 mg/dL — ABNORMAL HIGH (ref 65–99)
Glucose-Capillary: 120 mg/dL — ABNORMAL HIGH (ref 65–99)
Glucose-Capillary: 132 mg/dL — ABNORMAL HIGH (ref 65–99)
Glucose-Capillary: 162 mg/dL — ABNORMAL HIGH (ref 65–99)

## 2018-01-29 LAB — CALCIUM, IONIZED: CALCIUM, IONIZED, SERUM: 5.1 mg/dL (ref 4.5–5.6)

## 2018-01-29 LAB — MAGNESIUM: MAGNESIUM: 1.4 mg/dL — AB (ref 1.7–2.4)

## 2018-01-29 MED ORDER — MIDAZOLAM HCL 2 MG/2ML IJ SOLN
1.0000 mg | Freq: Once | INTRAMUSCULAR | Status: AC
Start: 2018-01-29 — End: 2018-01-29
  Administered 2018-01-29: 1 mg via INTRAVENOUS
  Filled 2018-01-29: qty 2

## 2018-01-29 MED ORDER — ALTEPLASE 2 MG IJ SOLR
2.0000 mg | Freq: Once | INTRAMUSCULAR | Status: AC | PRN
  Administered 2018-01-30: 2 mg
  Filled 2018-01-29: qty 2

## 2018-01-29 MED ORDER — AMIODARONE IV BOLUS ONLY 150 MG/100ML
150.0000 mg | Freq: Once | INTRAVENOUS | Status: AC
  Administered 2018-01-29: 150 mg via INTRAVENOUS
  Filled 2018-01-29: qty 100

## 2018-01-29 MED ORDER — DIGOXIN 0.25 MG/ML IJ SOLN
0.2500 mg | INTRAMUSCULAR | Status: AC
  Administered 2018-01-29 (×3): 0.25 mg via INTRAVENOUS
  Filled 2018-01-29: qty 1
  Filled 2018-01-29: qty 2
  Filled 2018-01-29 (×3): qty 1

## 2018-01-29 MED ORDER — FUROSEMIDE 10 MG/ML IJ SOLN
60.0000 mg | INTRAMUSCULAR | Status: AC
  Administered 2018-01-29: 60 mg via INTRAVENOUS
  Filled 2018-01-29: qty 8

## 2018-01-29 MED ORDER — LORAZEPAM 2 MG/ML IJ SOLN
1.0000 mg | Freq: Four times a day (QID) | INTRAMUSCULAR | Status: DC | PRN
  Administered 2018-01-30 – 2018-02-11 (×16): 1 mg via INTRAVENOUS
  Filled 2018-01-29 (×18): qty 1

## 2018-01-29 MED ORDER — FUROSEMIDE 10 MG/ML IJ SOLN
20.0000 mg | Freq: Four times a day (QID) | INTRAMUSCULAR | Status: AC
  Administered 2018-01-29 (×2): 20 mg via INTRAVENOUS
  Filled 2018-01-29 (×2): qty 2

## 2018-01-29 MED ORDER — POTASSIUM CHLORIDE 20 MEQ/15ML (10%) PO SOLN
40.0000 meq | Freq: Once | ORAL | Status: AC
  Administered 2018-01-29: 40 meq
  Filled 2018-01-29 (×4): qty 30

## 2018-01-29 MED ORDER — DIGOXIN 250 MCG PO TABS
0.2500 mg | ORAL_TABLET | Freq: Every day | ORAL | Status: DC
  Administered 2018-01-30 – 2018-03-19 (×46): 0.25 mg
  Filled 2018-01-29 (×50): qty 1

## 2018-01-29 MED ORDER — DIGOXIN 250 MCG PO TABS: 0.2500 mg | ORAL_TABLET | Freq: Every day | ORAL | Status: DC

## 2018-01-29 MED ORDER — AMIODARONE HCL 200 MG PO TABS
400.0000 mg | ORAL_TABLET | Freq: Two times a day (BID) | ORAL | Status: DC
  Administered 2018-01-29 – 2018-02-10 (×24): 400 mg
  Filled 2018-01-29 (×25): qty 2

## 2018-01-29 MED ORDER — FLUOXETINE HCL 20 MG/5ML PO SOLN
40.0000 mg | Freq: Every day | ORAL | Status: DC
  Administered 2018-01-29 – 2018-02-10 (×13): 40 mg
  Filled 2018-01-29 (×15): qty 10

## 2018-01-29 MED ORDER — MAGNESIUM SULFATE 4 GM/100ML IV SOLN
4.0000 g | Freq: Once | INTRAVENOUS | Status: AC
  Administered 2018-01-29: 4 g via INTRAVENOUS
  Filled 2018-01-29: qty 100

## 2018-01-29 MED ORDER — METOPROLOL TARTRATE 25 MG PO TABS
25.0000 mg | ORAL_TABLET | Freq: Four times a day (QID) | ORAL | Status: DC
  Administered 2018-01-29 – 2018-02-10 (×22): 25 mg
  Filled 2018-01-29 (×29): qty 1

## 2018-01-29 MED ORDER — ATORVASTATIN CALCIUM 20 MG PO TABS
80.0000 mg | ORAL_TABLET | Freq: Every day | ORAL | Status: DC
  Administered 2018-01-29 – 2018-02-10 (×13): 80 mg
  Filled 2018-01-29 (×12): qty 4

## 2018-01-29 NOTE — Progress Notes (Addendum)
This RN and Research scientist (physical sciences), RRT spoke with Dr. Duanne Limerick about patient being very restless with RR 40 on ventilator and desating to 89%.  RN also made MD aware that patient continues to have runs of SVT.  MD stated he would order something for agitation.

## 2018-01-29 NOTE — Progress Notes (Signed)
RN spoke with Dr. Darrold Junker on the phone and made MD aware that patient continues to go in and out of SVT.  After discussion and reviewing lab work Dr. Darrold Junker gave order for Digoxin IV 0.25 mg q2H times 3 doses and for digoxin 0.25 mg PO daily.  MD also ordered metoprolol 25 mg q6H per tube

## 2018-01-29 NOTE — Progress Notes (Signed)
[facility] Cardiology  SUBJECTIVE: Some laying in bed, status post tracheostomy   Vitals:   01/29/18 1430 01/29/18 1457 01/29/18 1500 01/29/18 1630  BP: 134/65  120/71   Pulse: 96 (!) 186 98 89  Resp: (!) Temp: 99.7 F (37.6 C)  99.7 F (37.6 C) 99.5 F (37.5 C)  TempSrc:      SpO2: 96%  94% 93%  Weight:      Height:         Intake/Output Summary (Last 24 hours) at 01/29/2018 1719 Last data filed at 01/29/2018 1542 Gross per 24 hour  Intake 1411.5 ml  Output 4980 ml  Net -3568.5 ml      PHYSICAL EXAM  General: Well developed, well nourished, in no acute distress HEENT:  Normocephalic and atramatic Neck:  No JVD.  Lungs: Clear bilaterally to auscultation and percussion. Heart: HRRR . Normal S1 and S2 without gallops or murmurs.  Abdomen: Bowel sounds are positive, abdomen soft and non-tender  Msk:  Back normal, normal gait. Normal strength and tone for age. Extremities: No clubbing, cyanosis or edema.   Neuro: Alert and oriented X 3. Psych:  Good affect, responds appropriately   LABS: Basic Metabolic Panel: Recent Labs    01/28/18 0401 01/29/18 0448 01/29/18 1238  NA 140 137 138  K 3.5 3.5 3.4*  CL 96* 95* 92*  CO2 40* 37* 39*  GLUCOSE 191* 137* 169*  BUN 22* 16 14  CREATININE 0.76 0.69 0.70  CALCIUM 8.1* 8.1* 8.2*  MG 1.7  --  1.4*  PHOS 3.3  --   --    Liver Function Tests: No results for input(s): AST, ALT, ALKPHOS, BILITOT, PROT, ALBUMIN in the last 72 hours. No results for input(s): LIPASE, AMYLASE in the last 72 hours. CBC: Recent Labs    01/28/18 0401 01/29/18 0448  WBC 11.7* 11.1*  NEUTROABS 8.0* 7.7*  HGB 7.4* 7.9*  HCT 23.1* 24.4*  MCV 84.6 84.3  PLT 200 210   Cardiac Enzymes: No results for input(s): CKTOTAL, CKMB, CKMBINDEX, TROPONINI in the last 72 hours. BNP: Invalid input(s): POCBNP D-Dimer: No results for input(s): DDIMER in the last 72 hours. Hemoglobin A1C: Recent Labs    01/28/18 0401  HGBA1C 7.4*   Fasting  Lipid Panel: No results for input(s): CHOL, HDL, LDLCALC, TRIG, CHOLHDL, LDLDIRECT in the last 72 hours. Thyroid Function Tests: No results for input(s): TSH, T4TOTAL, T3FREE, THYROIDAB in the last 72 hours.  Invalid input(s): FREET3 Anemia Panel: No results for input(s): VITAMINB12, FOLATE, FERRITIN, TIBC, IRON, RETICCTPCT in the last 72 hours.  Dg Chest Port 1 View  Result Date: 01/29/2018 CLINICAL DATA:  Shortness of breath EXAM: PORTABLE CHEST 1 VIEW COMPARISON:  January 28, 2018 FINDINGS: Tracheostomy catheter tip is 5.0 cm above the carina. Central catheter tip is in the superior vena cava. No pneumothorax. There is diffuse airspace opacity throughout the lungs bilaterally, similar to 1 day prior. There are small pleural effusions bilaterally. No new opacity evident. Heart is enlarged with pulmonary venous hypertension. No adenopathy appreciable. No bone lesions. IMPRESSION: Tube and catheter positions as described without pneumothorax. Cardiomegaly with pulmonary venous hypertension, consistent with pulmonary vascular congestion. Small pleural effusions bilaterally. Widespread airspace opacity bilaterally may represent alveolar edema or pneumonia. Both entities may exist concurrently. A degree of underlying ARDS is also possible. The overall appearance is essentially stable compared to 1 day prior. Electronically Signed   By: [name].   On: 01/29/2018  07:45   Dg Chest Port 1 View  Result Date: 01/28/2018 CLINICAL DATA:  Pneumonia EXAM: PORTABLE CHEST 1 VIEW COMPARISON:  01/23/2018 FINDINGS: Tracheostomy and right PICC line remain in place, unchanged. Cardiomegaly. Vascular congestion and diffuse bilateral airspace opacities are again noted, unchanged. Low lung volumes. No visible effusions or acute bony abnormality. IMPRESSION: Cardiomegaly with vascular congestion and diffuse bilateral airspace disease which could reflect edema or infection. No change. Electronically Signed    By: Charlett Nose M.D.   On: 01/28/2018 09:21     Echo LVEF 20 to 25%  TELEMETRY: Sinus rhythm:  ASSESSMENT AND PLAN:  Principal Problem:   Severe sepsis with septic shock (HCC) Active Problems:   GI bleed   Acute respiratory failure with hypoxia (HCC)   Multifocal pneumonia   UTI (urinary tract infection)   AKI (acute kidney injury) (HCC)   Ileus (HCC)   Diabetes (HCC)   Pressure injury of skin    1.  Paroxysmal atrial fibrillation with rapid ventricular rate, with intermittent episodes of atrial fibrillation on amiodarone and metoprolol 2.  Dilated cardiomyopathy 3.  Chronic systolic congestive heart failure 4.  Respiratory failure, status post tracheostomy on ventilator  Recommendations  1.  Continue current medications 2.  Continue diuresis 3.  Carefully monitor renal status 4.  Uptitrate amiodarone to 400 mg twice daily 5.  Increase metoprolol tartrate to 25 mg every 6 6.  Digoxin 0.25 mg IV every 2 hours x3 doses 7.  Maintenance digoxin 0.25 mg p.o. Daily 8.  Taper amiodarone to 200 mg twice daily once atrial fibrillation under better control   Marcina Millard, MD, PhD, [facility] 01/29/2018 5:19 PM

## 2018-01-29 NOTE — Progress Notes (Signed)
OT Cancellation Note  Patient Details Name: Meliton Samad MRN: [id number] DOB: 09/11/1961   Cancelled Treatment:    Reason Eval/Treat Not Completed: Medical issues which prohibited therapy. Per chart review, patient with inconsistent runs of SVT this date, limited HR control despite medications.  Will hold exertional activity at this time and re-attempt next date as medically appropriate.  Richrd Prime, MPH, MS, OTR/L ascom [phone] 01/29/18, 2:04 PM

## 2018-01-29 NOTE — Progress Notes (Signed)
RN spoke with Dr. Darrold Junker and made MD aware of reconsult regarding continued rhythm in and out of SVT rate as high at 200.  MD gave order to change amiodarone to 400 mg BID per tube.

## 2018-01-29 NOTE — Progress Notes (Signed)
Alert with mother and sister at the bedside.  Patient has been more alert following commands and trying to communicate more during shift.  Patient has been in and out of Sinus tach and afib with SVT. Since Digoxin has been started heart rate has been better controlled with less runs of SVT.  Excellent urine output from lasix with > 5,000 cc of urine output this shift.

## 2018-01-29 NOTE — Progress Notes (Signed)
PT Cancellation Note  Patient Details Name: Tommy Mathews MRN: [id number] DOB: Aug 16, 1961   Cancelled Treatment:    Reason Eval/Treat Not Completed: Medical issues which prohibited therapy(Per chart review, patient with inconsistent runs of SVT this date, limited HR control despite medications.  Will hold exertional activity at this time and re-attempt next date as medically appropriate.)   [name] H. [name], PT, DPT, NCS 01/29/18, 2:01 PM [phone]

## 2018-01-29 NOTE — Progress Notes (Signed)
Name: Tommy Mathews MRN: [id number] DOB: 02/12/1961     CONSULTATION DATE: 01/06/2018  Subjective & Objective: Continues to have paroxysmal A fib with RVR, episodes of restlessness requiring mittens   PAST MEDICAL HISTORY :   has a past medical history of Diabetes (HCC), GERD (gastroesophageal reflux disease), HLD (hyperlipidemia), and HTN (hypertension).  has a past surgical history that includes PEG placement (N/A, 01/21/2018) and Tracheostomy tube placement (N/A, 01/23/2018). Prior to Admission medications   Medication Sig Start Date End Date Taking? Authorizing Provider  atorvastatin (LIPITOR) 80 MG tablet Take 80 mg by mouth at bedtime.   Yes [provider]  divalproex (DEPAKOTE ER) 500 MG 24 hr tablet Take 500 mg by mouth 2 (two) times daily.   Yes [provider]  estradiol (ESTRACE) 2 MG tablet Take 2 mg by mouth daily.   Yes [provider]  FLUoxetine (PROZAC) 40 MG capsule Take 40 mg by mouth daily.   Yes [provider]  folic acid (FOLVITE) 800 MCG tablet Take 800 mcg by mouth daily.   Yes [provider]  hydrochlorothiazide (HYDRODIURIL) 50 MG tablet Take 50 mg by mouth daily.   Yes [provider]  lisinopril (PRINIVIL,ZESTRIL) 5 MG tablet Take 5 mg by mouth daily.   Yes [provider]  metFORMIN (GLUCOPHAGE) 1000 MG tablet Take 1,000 mg by mouth 2 (two) times daily with a meal.   Yes [provider]  methotrexate (RHEUMATREX) 2.5 MG tablet Take 15 mg by mouth once a week.   Yes [provider]  metoprolol tartrate (LOPRESSOR) 50 MG tablet Take 50 mg by mouth 2 (two) times daily.   Yes [provider]  Omega-3 Fatty Acids (FISH OIL) 1000 MG CAPS Take 1,000 mg by mouth daily.   Yes [provider]  QUEtiapine Fumarate (SEROQUEL PO) Take 500 mg by mouth 2 (two) times daily.   Yes [provider]  ranitidine (ZANTAC) 150 MG tablet Take 150 mg by mouth daily.   Yes  [provider]  rivaroxaban (XARELTO) 10 MG TABS tablet Take 10 mg by mouth daily.   Yes [provider]  senna-docusate (SENOKOT-S) 8.6-50 MG tablet Take 2 tablets by mouth at bedtime.   Yes [provider]  sodium chloride 1 g tablet Take 1 g by mouth daily.   Yes [provider]   No Known Allergies  FAMILY HISTORY:  family history is not on file. SOCIAL HISTORY:  reports that he has been smoking cigarettes.  He started smoking about 19 years ago. He has a 40.00 pack-year smoking history. He has never used smokeless tobacco. He reports that he does not drink alcohol or use drugs.  REVIEW OF SYSTEMS:   Unable to obtain due to critical illness   VITAL SIGNS: Temp:  [97.7 F (36.5 C)-99.9 F (37.7 C)] 99.7 F (37.6 C) (05/01 0830) Pulse Rate:  [77-177] 102 (05/01 0830) Resp:  [11-37] 23 (05/01 0830) BP: (80-158)/(42-106) 151/94 (05/01 0830) SpO2:  [90 %-98 %] 95 % (05/01 0830) FiO2 (%):  [40 %-50 %] 50 % (04/30 2302)  Physical Examination:  Awake, moving extremities and not following commands Trach in place on vent and no distress, BEAE and diffuse medium crackles with small thick grey secretions S1 & S2 audible and no murmur Benign abdomen with normal peristalses Anasarca and scrotal edema 1 +  ASSESSMENT / PLAN:  Chronic respiratory failure tolerating CPAP/PS 07/06/39%  -Vent and trach weaning as tolerated.  A fib  with RVR, CHF with dilated cardiomyopathy with LV EF 20%.  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 Subtherapeutic Valproic acid (improved) -Optimize meds and monitor level.  Pneumonia with aspiration. worsening bil. Airspace disease with pulmonary edema -Completed ABX. -Monitor CXR + CBC + FIO2.\ -Started on Acetylcyst + Bronchodilators Neb because of thick respiratory secretions. -Diuresis to improve lung  compliance.  HTN -Optimize antihypertensives and monitor hemodynamics  DM -Glycemic control  AKI and mild prerenal azotemia. -Monitor renal panle  Fecal impaction and constipation (resolved). Ileus on KUB -Senokot-S -Reglan  Anemia with HB drop 0.9 gm within the last 24 h on Xeralto. -Keep HB > 7 gm/dl.  Dysphagia -PEG for feeding  Full code  DVT & GI Prophylaxis. Continue with supportive care  Critical care time 35 min

## 2018-01-29 NOTE — Progress Notes (Signed)
Patient ID: Tommy Mathews, male   DOB: 09/06/1961, 57 y.o.   MRN: [id number]  [facility] PROGRESS NOTE  Tommy Mathews OXB:[id number] DOB: [birth year] DOA: 01/06/2018 PCP: Isaias Cowman, MD  HPI/Subjective: Patient more alert today.  Went into rapid SVT 195 rate when I was listening to his heart.  I told him to press down like having a bowel movement and he broke into sinus rhythm after that.  Objective: Vitals:   01/29/18 1200 01/29/18 1230  BP: (!) 145/89 139/76  Pulse: 86 99  Resp: 19 (!) 24  Temp: 99.7 F (37.6 C) 99.7 F (37.6 C)  SpO2: 95% 96%    Filed Weights   01/19/18 0347 01/20/18 1200 01/26/18 0418  Weight: (!) 136.7 kg (301 lb 5.9 oz) (!) 139.5 kg (307 lb 8.7 oz) 133.2 kg (293 lb 10.4 oz)    ROS: Review of Systems  Unable to perform ROS: Acuity of condition   Exam: Physical Exam  Constitutional: He appears lethargic.  HENT:  Nose: No mucosal edema.  Eyes: Pupils are equal, round, and reactive to light. Conjunctivae and lids are normal.  Neck: Carotid bruit is not present. No thyroid mass present.  Cardiovascular: Regular rhythm, S1 normal, S2 normal and normal heart sounds.  Respiratory: He has decreased breath sounds in the right lower field and the left lower field. He has no wheezes. He has rhonchi in the right middle field, the right lower field, the left middle field and the left lower field. He has no rales.  GI: Soft. Bowel sounds are normal. There is tenderness.  Genitourinary:  Genitourinary Comments: 4+ scrotal edema, penile edema  Musculoskeletal:       Right ankle: He exhibits swelling.       Left ankle: He exhibits swelling.  Neurological: He appears lethargic.  Sedated  Skin: Skin is warm. No rash noted. Nails show no clubbing.  Psychiatric: His affect is blunt.      Data Reviewed: Basic Metabolic Panel: Recent Labs  Lab 01/25/18 0531 01/26/18 0447 01/27/18 0455 01/28/18 0401 01/29/18 0448  NA 142 142 142 140 137  K 3.8  4.1 4.0 3.5 3.5  CL 103 101 98* 96* 95*  CO2 35* 37* 38* 40* 37*  GLUCOSE 186* 104* 77 191* 137*  BUN 23* 21* 20 22* 16  CREATININE 0.64 0.60* 0.79 0.76 0.69  CALCIUM 8.1* 8.1* 8.2* 8.1* 8.1*  MG 1.5* 1.7 1.7 1.7  --   PHOS 3.6 3.5  --  3.3  --    Liver Function Tests: Recent Labs  Lab 01/26/18 0447  AST 23  ALT 19  ALKPHOS 74  BILITOT 0.3  PROT 5.2*  ALBUMIN 1.6*   CBC: Recent Labs  Lab 01/23/18 0422 01/26/18 0447 01/27/18 0455 01/28/18 0401 01/29/18 0448  WBC 15.0* 8.9 11.4* 11.7* 11.1*  NEUTROABS  --  5.9 7.6* 8.0* 7.7*  HGB 9.4* 8.4* 8.3* 7.4* 7.9*  HCT 29.7* 25.5* 25.6* 23.1* 24.4*  MCV 86.7 84.9 84.1 84.6 84.3  PLT 169 162 205 200 210   BNP (last 3 results) Recent Labs    01/06/18 1944 01/16/18 2332  BNP 37.0 402.0*     CBG: Recent Labs  Lab 01/28/18 1923 01/28/18 2310 01/29/18 0320 01/29/18 0710 01/29/18 1217  GLUCAP 114* 134* 120* 160* 160*     Studies: Dg Chest Port 1 View  Result Date: 01/29/2018 CLINICAL DATA:  Shortness of breath EXAM: PORTABLE CHEST 1 VIEW COMPARISON:  January 28, 2018 FINDINGS: Tracheostomy catheter tip is  5.0 cm above the carina. Central catheter tip is in the superior vena cava. No pneumothorax. There is diffuse airspace opacity throughout the lungs bilaterally, similar to 1 day prior. There are small pleural effusions bilaterally. No new opacity evident. Heart is enlarged with pulmonary venous hypertension. No adenopathy appreciable. No bone lesions. IMPRESSION: Tube and catheter positions as described without pneumothorax. Cardiomegaly with pulmonary venous hypertension, consistent with pulmonary vascular congestion. Small pleural effusions bilaterally. Widespread airspace opacity bilaterally may represent alveolar edema or pneumonia. Both entities may exist concurrently. A degree of underlying ARDS is also possible. The overall appearance is essentially stable compared to 1 day prior. Electronically Signed   By: Lowella Grip III M.D.   On: 01/29/2018 07:45   Dg Chest Port 1 View  Result Date: 01/28/2018 CLINICAL DATA:  Pneumonia EXAM: PORTABLE CHEST 1 VIEW COMPARISON:  01/23/2018 FINDINGS: Tracheostomy and right PICC line remain in place, unchanged. Cardiomegaly. Vascular congestion and diffuse bilateral airspace opacities are again noted, unchanged. Low lung volumes. No visible effusions or acute bony abnormality. IMPRESSION: Cardiomegaly with vascular congestion and diffuse bilateral airspace disease which could reflect edema or infection. No change. Electronically Signed   By: Rolm Baptise M.D.   On: 01/28/2018 09:21    Scheduled Meds: . acetylcysteine  3 mL Nebulization Q8H  . amiodarone  400 mg Per Tube BID  . atorvastatin  80 mg Per Tube q1800  . budesonide (PULMICORT) nebulizer solution  0.5 mg Nebulization BID  . chlorhexidine gluconate (MEDLINE KIT)  15 mL Mouth Rinse BID  . clonazePAM  0.5 mg Per Tube BID  . fentaNYL  75 mcg Transdermal Q72H  . FLUoxetine  40 mg Per Tube Daily  . free water  200 mL Per Tube Q8H  . furosemide  20 mg Intravenous Q6H  . hydrochlorothiazide  25 mg Oral Daily  . insulin aspart  0-20 Units Subcutaneous Q4H  . insulin glargine  20 Units Subcutaneous QHS  . ipratropium-albuterol  3 mL Nebulization Q8H  . lisinopril  5 mg Per Tube Daily  . mouth rinse  15 mL Mouth Rinse 10 times per day  . metoCLOPramide  10 mg Oral Q12H  . metoprolol tartrate  12.5 mg Per Tube TID  . nystatin  5 mL Oral QID  . pantoprazole sodium  40 mg Per Tube Daily  . rivaroxaban  10 mg Per Tube Daily  . Valproate Sodium  1,000 mg Per Tube BID   Continuous Infusions: . feeding supplement (GLUCERNA 1.5 CAL) 65 mL/hr at 01/29/18 0200    Assessment/Plan:  1. Acute hypoxic respiratory failure.  Status post tracheostomy and is on ventilator at 40% FiO2.  Will need long-term weaning.  Will likely need a long-term facility that can do long-term weaning on ventilator. 2. Atrial fibrillation  with rapid ventricular response, episodes of SVT.  Patient on amiodarone and metoprolol.  Patient had an episode of NSVT while is in the room.  I asked him to do a Valsalva maneuver and his SVT broke.  Patient also on Xarelto now.  Watch closely for bleeding. 3. Acute on chronic systolic congestive heart failure with anasarca and scrotal edema.  On IV Lasix now. 4. Acute encephalopathy.   history of traumatic brain injury. 5. Septic shock secondary to pneumonia and urinary tract infection.  Patient is off vasopressors and finished antibiotics. 6. Gastrointestinal bleed.  Hemoglobin stable on PPI.  Endoscopy negative. 7. Atrial fibrillation with rapid ventricular response.  Now in normal sinus rhythm on  oral amiodarone and metoprolol. 8. Acute kidney injury resolved. 9. Type 2 diabetes mellitus on glargine insulin and sliding scale 10. Nutrition on PEG tube  feeding 11. Depression on Prozac and Depakene. 12. Thrush on nystatin swish and swallow  Code Status:     Code Status Orders  (From admission, onward)        Start     Ordered   01/06/18 2227  Full code  Continuous     01/06/18 2226    Code Status History    This patient has a current code status but no historical code status.     Family Communication:  as per critical care specialist Disposition Plan:  will need long-term placement on a facility that can handle a ventilator  Consultants:  Critical care specialist  Cardiology  Time spent: 24 minutes  [place]

## 2018-01-29 NOTE — Progress Notes (Signed)
Received pt after midnight. Pt had continuous runs of SVT. Amio bolus given.

## 2018-01-29 NOTE — Progress Notes (Signed)
Dr. Duanne Limerick present and gave order for Cardiology consult.

## 2018-01-29 NOTE — Clinical Social Work Note (Addendum)
Clinical Social Work Assessment  Patient Details  Name: Tommy Mathews MRN: [id number] Date of Birth: [birth year]  Date of referral:  01/08/18               Reason for consult:  Discharge Planning, Facility Placement                Permission sought to share information with:  Facility Medical sales representative, Family Supports Permission granted to share information::  Yes, Release of Information Signed  Name::     [place] Mother [phone]   Agency::  SNF admissions  Relationship::     Contact Information:     Housing/Transportation Living arrangements for the past 2 months:  Group Home Source of Information:  Facility, Parent Patient Interpreter Needed:  None Criminal Activity/Legal Involvement Pertinent to Current Situation/Hospitalization:  No - Comment as needed Significant Relationships:  Siblings, Parents Lives with:  Facility Resident Do you feel safe going back to the place where you live?  No Need for family participation in patient care:  Yes (Comment)  Care giving concerns:  Patient may need vent SNF, and he is unable to return back to family care home.   Social Worker assessment / plan:  Patient is a 57 year old male who is from [facility], and is alert and oriented x2.  Patient is currently on a ventilator, so it is hard to communicate with patient.  Assessment completed by talking to patient's mom, and reviewing medical chart.  Patient is currently on a vent, and physicians are now recommending that patient go to a vent snf.  Patient's mother is the main contact, CSW spoke to her to discuss patient's situation, and she was informed there are only two snfs that take vents in the surrounding area, [facility] in [place], and [facility] in [place].  Patient only has medicaid which makes it more difficult for patient to go to a different snf.  Patient's mom stated that he was mobile at the group home until he became unwell and had to go to  hospital.  Patient's mother gave CSW permission to begin bed search for a vent SNF.  Patient's mother did not have any other questions or concerns. Employment status:  Disabled (Comment on whether or not currently receiving Disability) Insurance information:  Medicaid In [place] PT Recommendations:  Skilled Nursing Facility Information / Referral to community resources:  Skilled Nursing Facility  Patient/Family's Response to care:  Patient's mother is in agreement to having patient go to a vent SNF.  Patient/Family's Understanding of and Emotional Response to Diagnosis, Current Treatment, and Prognosis:  Patient's mother is hopeful that he can be placed in [place], instead of [place] due to the distance that she would have to drive to see him.  Patient is calm and resting while on vent.  Emotional Assessment Appearance:  Appears stated age Attitude/Demeanor/Rapport:  (patient currently sedated in ICU) Affect (typically observed):  Appropriate, Stable Orientation:  Oriented to Self, Oriented to Place(patient currently sedated in ICU) Alcohol / Substance use:  Not Applicable Psych involvement (Current and /or in the community):  No (Comment)  Discharge Needs  Concerns to be addressed:  Lack of Support Readmission within the last 30 days:  No Current discharge risk:  Lack of support system Barriers to Discharge:  Vent Bed not available, Continued Medical Work up   Darleene Cleaver, LCSWA 01/29/2018, 7:57 AM

## 2018-01-29 NOTE — Clinical Social Work Note (Signed)
CSW has faxed patient's referral to:  [facility]  [place] MSW,LCSW [phone]

## 2018-01-30 ENCOUNTER — Inpatient Hospital Stay: Payer: Medicaid Other

## 2018-01-30 LAB — PREPARE RBC (CROSSMATCH)

## 2018-01-30 LAB — COMPREHENSIVE METABOLIC PANEL
ALT: 21 U/L (ref 17–63)
AST: 33 U/L (ref 15–41)
Albumin: 2 g/dL — ABNORMAL LOW (ref 3.5–5.0)
Alkaline Phosphatase: 68 U/L (ref 38–126)
Anion gap: 6 (ref 5–15)
BUN: 14 mg/dL (ref 6–20)
CO2: 40 mmol/L — ABNORMAL HIGH (ref 22–32)
Calcium: 8.1 mg/dL — ABNORMAL LOW (ref 8.9–10.3)
Chloride: 92 mmol/L — ABNORMAL LOW (ref 101–111)
Creatinine, Ser: 0.71 mg/dL (ref 0.61–1.24)
GFR calc Af Amer: >60 mL/min (ref >60)
GFR calc non Af Amer: >60 mL/min (ref >60)
Glucose, Bld: 130 mg/dL — ABNORMAL HIGH (ref 65–99)
Potassium: 3.7 mmol/L (ref 3.5–5.1)
Sodium: 138 mmol/L (ref 135–145)
Total Bilirubin: 0.3 mg/dL (ref 0.3–1.2)
Total Protein: 5.8 g/dL — ABNORMAL LOW (ref 6.5–8.1)

## 2018-01-30 LAB — AMMONIA: AMMONIA: 27 umol/L (ref 9–35)

## 2018-01-30 LAB — GLUCOSE, CAPILLARY
GLUCOSE-CAPILLARY: 152 mg/dL — AB (ref 65–99)
GLUCOSE-CAPILLARY: 148 mg/dL — AB (ref 65–99)
GLUCOSE-CAPILLARY: 114 mg/dL — AB (ref 65–99)
Glucose-Capillary: 116 mg/dL — ABNORMAL HIGH (ref 65–99)
Glucose-Capillary: 146 mg/dL — ABNORMAL HIGH (ref 65–99)

## 2018-01-30 LAB — MAGNESIUM: Magnesium: 1.8 mg/dL (ref 1.7–2.4)

## 2018-01-30 LAB — VALPROIC ACID LEVEL: Valproic Acid Lvl: 53 ug/mL (ref 50.0–100.0)

## 2018-01-30 LAB — HEMOGLOBIN AND HEMATOCRIT, BLOOD
HCT: 22.3 % — ABNORMAL LOW (ref 40.0–52.0)
Hemoglobin: 7.1 g/dL — ABNORMAL LOW (ref 13.0–18.0)

## 2018-01-30 LAB — PHOSPHORUS: Phosphorus: 3.2 mg/dL (ref 2.5–4.6)

## 2018-01-30 MED ORDER — IPRATROPIUM-ALBUTEROL 0.5-2.5 (3) MG/3ML IN SOLN
3.0000 mL | Freq: Once | RESPIRATORY_TRACT | Status: AC
  Administered 2018-01-30: 3 mL via RESPIRATORY_TRACT

## 2018-01-30 MED ORDER — FUROSEMIDE 10 MG/ML IJ SOLN
40.0000 mg | Freq: Two times a day (BID) | INTRAMUSCULAR | Status: AC
  Administered 2018-01-30 – 2018-01-31 (×2): 40 mg via INTRAVENOUS
  Filled 2018-01-30 (×2): qty 4

## 2018-01-30 MED ORDER — SODIUM CHLORIDE 0.9 % IV SOLN
Freq: Once | INTRAVENOUS | Status: AC
  Administered 2018-01-30: 18:00:00 via INTRAVENOUS

## 2018-01-30 MED ORDER — LORAZEPAM 2 MG/ML IJ SOLN
1.0000 mg | Freq: Once | INTRAMUSCULAR | Status: AC
  Administered 2018-01-30: 1 mg via INTRAVENOUS

## 2018-01-30 MED ORDER — STERILE WATER FOR INJECTION IJ SOLN
INTRAMUSCULAR | Status: AC
  Filled 2018-01-30: qty 10

## 2018-01-30 MED ORDER — FUROSEMIDE 10 MG/ML IJ SOLN
40.0000 mg | Freq: Once | INTRAMUSCULAR | Status: AC
  Administered 2018-01-30: 40 mg via INTRAVENOUS
  Filled 2018-01-30: qty 4

## 2018-01-30 MED ORDER — DOCUSATE SODIUM 50 MG/5ML PO LIQD
100.0000 mg | Freq: Two times a day (BID) | ORAL | Status: DC
  Administered 2018-01-30 – 2018-02-03 (×8): 100 mg
  Filled 2018-01-30 (×9): qty 10

## 2018-01-30 NOTE — Evaluation (Signed)
Occupational Therapy Evaluation Patient Details Name: Tommy Mathews MRN: [id number] DOB: 1961/07/04 Today's Date: 01/30/2018    History of Present Illness 56yo male presented to ER (4/8) secondary to respiratory distress (requiring intubation in ED); admitted with severe sepsis, shock due to multifocal PNA (possible aspiration event), UTI.  Mod amount of coffee-ground material suctioned through OG tube initially; suspected with GIB.  Hospital course further significant for difficulty weaning and persistent ventilator dyssynchrony, requiring administration of vecuronium; afib; PEG placement (4/23), trach placement (4/25); intermittent afib with RVR (controlled with medication as needed).   Clinical Impression   Pt seen for OT evaluation this date. Prior to hospital admission, pt was living in a group home. No family members present and no significant PLOF/history per chart review. Currently pt demonstrates significant impairments in activity tolerance, strength, edema t/o BLE/BUE, and impaired cognition. On vent trach, has PEG tube, and rectal tube. Pt able to visually track OT around the room and inconsistently follow simple commands <50% of the time. With set up of wash cloth and verbal/visual/tactile cues, pt was able to wash <25% of his face on the R side using his RUE, requiring max assist to wash the rest of his face. Pt required total assist x3 for sup>sit EOB and x4 for sit>sup. HR and BP did not change significantly. RN aware. Pt would benefit from skilled OT to address noted impairments and functional limitations (see below for any additional details) in order to maximize safety and independence while minimizing falls risk and caregiver burden. Upon hospital discharge, recommend pt discharge to vent SNF as appropriate.    Follow Up Recommendations  SNF(vent SNF)    Equipment Recommendations  Other (comment)(TBD)    Recommendations for Other Services       Precautions / Restrictions         Mobility Bed Mobility Overal bed mobility: Needs Assistance Bed Mobility: Supine to Sit;Sit to Supine Rolling: Total assist(x3)   Supine to sit: Total assist(x4)     General bed mobility comments: max verbal cues, x3-4 assist  Transfers Overall transfer level: Needs assistance   Transfers: Lateral/Scoot Transfers          Lateral/Scoot Transfers: Total assist(x4) General transfer comment: max verbal cues, minimal initiation of BUE and no initiation of BLE during attempts at lateral scoots    Balance Overall balance assessment: Needs assistance Sitting-balance support: Feet supported;Bilateral upper extremity supported Sitting balance-Leahy Scale: Poor Sitting balance - Comments: poor-; generally max assist for sitting balance with max verbal cues to decrease L lateral and posterior lean, with brief periods of close SBA for sitting balance Postural control: Posterior lean;Left lateral lean     Standing balance comment: unsafe to attempt                           ADL either performed or assessed with clinical judgement   ADL Overall ADL's : Needs assistance/impaired Eating/Feeding: NPO Eating/Feeding Details (indicate cue type and reason): PEG tube Grooming: Bed level;Maximal assistance Grooming Details (indicate cue type and reason): max verbal cues and set up to initiate washing R side of face w/ R hand and max assist to continue through task completion  Upper Body Bathing: Bed level;Maximal assistance   Lower Body Bathing: Bed level;Total assistance   Upper Body Dressing : Bed level;Maximal assistance   Lower Body Dressing: Bed level;Total assistance     Toilet Transfer Details (indicate cue type and reason): unsafe to attempt Toileting- Clothing  Manipulation and Hygiene: Total assistance Toileting - Clothing Manipulation Details (indicate cue type and reason): rectal tube             Vision Baseline Vision/History: (no indication in  chart, pt unable to verbalize) Patient Visual Report: (pt unable to verbalize) Additional Comments: pt visually tracks OT around room, no deficits noted through informal assessment     Perception     Praxis      Pertinent Vitals/Pain Pain Assessment: Faces(mouths no, but body language indicates otherwise) Faces Pain Scale: Hurts a little bit Pain Location: unable to verbalize where Pain Descriptors / Indicators: Grimacing Pain Intervention(s): Limited activity within patient's tolerance;Monitored during session;Repositioned     Hand Dominance Right   Extremity/Trunk Assessment Upper Extremity Assessment Upper Extremity Assessment: RUE deficits/detail;LUE deficits/detail;Generalized weakness RUE Deficits / Details: R UE grossly 2+/5, generally edematous throughout LUE Deficits / Details: L UE grossly 3-/5, generally edematous throughout   Lower Extremity Assessment Lower Extremity Assessment: Generalized weakness(bilat LEs grossly at least 2+/5; increased strength/activation noted with spontaneous stretching/repositioning in bed (difficulty fully attending to formal MMT/ROM)))   Cervical / Trunk Assessment Cervical / Trunk Assessment: Other exceptions Cervical / Trunk Exceptions: chronic lateral L lean   Communication Communication Communication: Tracheostomy   Cognition Arousal/Alertness: Awake/alert Behavior During Therapy: Restless Overall Cognitive Status: Difficult to assess                                 General Comments: patient with vent/trach. Visually tracks therapist around room and attends to therapist throughout session; attempts to mouth words/communicate to therapist (unintelligible); unreliable/inconsistent yes/no responses; attempts to follow simple, verbal commands approx 25-50% time   General Comments  all tubes/lines/leads in place t/o session; HR well controlled 80-90's, BP improved slightly with supine>sit EOB     Exercises Other  Exercises Other Exercises: Pt instructed in BUE reaching exercises to improve strength and assess hand eye coordination and cognition   Shoulder Instructions      Home Living Family/patient expects to be discharged to:: Group home                                        Prior Functioning/Environment          Comments: Patient unable to provide accurate social history; will verify with family/facility as available.        OT Problem List: Decreased strength;Decreased knowledge of use of DME or AE;Increased edema;Decreased activity tolerance;Decreased cognition;Cardiopulmonary status limiting activity;Impaired UE functional use;Decreased safety awareness;Impaired balance (sitting and/or standing)      OT Treatment/Interventions: Self-care/ADL training;Balance training;Therapeutic exercise;Therapeutic activities;DME and/or AE instruction;Patient/family education    OT Goals(Current goals can be found in the care plan section) Acute Rehab OT Goals OT Goal Formulation: Patient unable to participate in goal setting ADL Goals Pt Will Perform Grooming: sitting;with mod assist(sitting EOB with set up and mod assist) Additional ADL Goal #1: Pt will follow >50% of commands w/ verbal/visual/tactile cues as needed to perform self care tasks.  OT Frequency: Min 2X/week   Barriers to D/C:            Co-evaluation PT/OT/SLP Co-Evaluation/Treatment: Yes Reason for Co-Treatment: Complexity of the patient's impairments (multi-system involvement);Necessary to address cognition/behavior during functional activity;For patient/therapist safety;To address functional/ADL transfers PT goals addressed during session: Mobility/safety with mobility;Balance;Strengthening/ROM OT goals addressed during session: Strengthening/ROM;ADL's  and self-care      AM-PAC PT "6 Clicks" Daily Activity     Outcome Measure Help from another person eating meals?: Total(PEG tube) Help from another  person taking care of personal grooming?: A Lot Help from another person toileting, which includes using toliet, bedpan, or urinal?: Total Help from another person bathing (including washing, rinsing, drying)?: A Lot Help from another person to put on and taking off regular upper body clothing?: Total Help from another person to put on and taking off regular lower body clothing?: Total 6 Click Score: 8   End of Session    Activity Tolerance: Patient tolerated treatment well Patient left: in bed;with call bell/phone within reach;with bed alarm set;with nursing/sitter in room;with SCD's reapplied  OT Visit Diagnosis: Other abnormalities of gait and mobility (R26.89);Muscle weakness (generalized) (M62.81);Other symptoms and signs involving cognitive function                Time: 1143-1240 OT Time Calculation (min): 57 min Charges:  OT General Charges $OT Visit: 1 Visit OT Evaluation $OT Eval High Complexity: 1 High OT Treatments $Self Care/Home Management : 8-22 mins  Richrd Prime, MPH, MS, OTR/L ascom [phone] 01/30/18, 1:47 PM

## 2018-01-30 NOTE — Progress Notes (Signed)
Patient was in pressure support ventilation from 12:46 to a little after 17:00. At 17:00 patient was agitated, tachypnea in the 40-50s, very increased work of breathing, O2 saturations in low 80s, and BP over 200 systolic. Patient suctioned and while sputum produced, no change in status. Alerted Jon, RT and FiO2 increased to 100% (which only elevated O2 sats to upper 80s. Jon RT arrived and put patient back into [facility] mode, which no change in patient status. Prn ativan given and minor improvements (see vital signs flowsheets), lungs with bilateral wheezes. Alerted Dr. Duanne Limerick, who gave orders for another dose of ativan and one time duoneb. Temperature increased to above 101 gradually throughout event and did began to decrease after medications given. Patient status improved after administration but still with occasional bouts of agitation and tachypnea.

## 2018-01-30 NOTE — Progress Notes (Signed)
  Amiodarone Drug - Drug Interaction Consult Note  Recommendations: Consider EKG with last 4/8.   Amiodarone is metabolized by the cytochrome P450 system and therefore has the potential to cause many drug interactions. Amiodarone has an average plasma half-life of 50 days (range 20 to 100 days).   There is potential for drug interactions to occur several weeks or months after stopping treatment and the onset of drug interactions may be slow after initiating amiodarone.    Statins: Increased risk of myopathy. Simvastatin- restrict dose to  daily. Other statins: counsel patients to report any muscle pain or weakness immediately.   Anticoagulants: Amiodarone can increase anticoagulant effect. Consider warfarin dose reduction. Patients should be monitored closely and the dose of anticoagulant altered accordingly, remembering that amiodarone levels take several weeks to stabilize.   Antiepileptics: Amiodarone can increase plasma concentration of phenytoin, the dose should be reduced. Note that small changes in phenytoin dose can result in large changes in levels. Monitor patient and counsel on signs of toxicity.   Beta blockers: increased risk of bradycardia, AV block and myocardial depression. Sotalol - avoid concomitant use.    Calcium channel blockers (diltiazem and verapamil): increased risk of bradycardia, AV block and myocardial depression.    Cyclosporine: Amiodarone increases levels of cyclosporine. Reduced dose of cyclosporine is recommended.   Digoxin dose should be halved when amiodarone is started.   Diuretics: increased risk of cardiotoxicity if hypokalemia occurs.   Oral hypoglycemic agents (glyburide, glipizide, glimepiride): increased risk of hypoglycemia. Patient's glucose levels should be monitored closely when initiating amiodarone therapy.    Drugs that prolong the QT interval:  Torsades de pointes risk may be increased with concurrent use -  avoid if possible.  Monitor QTc, also keep magnesium/potassium WNL if concurrent therapy can't be avoided. Marland Kitchen Antibiotics: e.g. fluoroquinolones, erythromycin. . Antiarrhythmics: e.g. quinidine, procainamide, disopyramide, sotalol. . Antipsychotics: e.g. phenothiazines, haloperidol.  . Lithium, tricyclic antidepressants, and methadone. Thank You,  Luisa Hart D  01/30/2018 8:53 AM

## 2018-01-30 NOTE — Progress Notes (Signed)
Physical Therapy Treatment Patient Details Name: Tommy Mathews MRN: [id number] DOB: 1961-02-07 Today's Date: 01/30/2018    History of Present Illness 57 yo male presented to ER (4/8) secondary to respiratory distress (requiring intubation in ED); admitted with severe sepsis, shock due to multifocal PNA (possible aspiration event), UTI.  Mod amount of coffee-ground material suctioned through OG tube initially; suspected with GIB.  Hospital course further significant for difficulty weaning and persistent ventilator dyssynchrony, requiring administration of vecuronium; afib; PEG placement (4/23), trach placement (4/25); intermittent afib with RVR (controlled with medication as needed).    PT Comments    Pt seen for PT/OT co-treat.  Pt able to bring LE's towards edge of bed with max cueing and physical assist and then requiring total assist x3 (2 physical assist and 3rd assist for lines/vent trach).  Pt initially with posterior and L lean sitting edge of bed (max assist for balance) but improved with re-positioning and cueing to shift weight forward and to midline (briefly close SBA but pt fluctuating with sitting balance assist levels).  Pt also noted with L lateral with L rotation of neck/head and requiring vc's and visual cues for midline (pt unable to maintain midline neck position). Attempted lateral scoot edge of bed but minimal initiation of B UE and no initiation of B LE noted when attempting lateral scoot.   Pt required 3 total assist sit to supine and to scoot up in bed (4th assist for lines/vent trach).  Pt able to follow commands 25-50% of the time but inconsistent and requiring vc's and tactile cues for activities.  Able to mouth words but difficult to understand.  HR 80's to 90's bpm during session and no significant change in BP or O2 readings noted. Will continue to progress with strengthening, bed mobility, sitting balance, and progressive transfer activity per pt tolerance.   Follow Up  Recommendations  ([facility] SN[facility])     Equipment Recommendations  (TBD at next facility)    Recommendations for Other Services       Precautions / Restrictions Precautions Precautions: Fall Precaution Comments: NPO/PEG, trach/vent Restrictions Weight Bearing Restrictions: No    Mobility  Bed Mobility Overal bed mobility: Needs Assistance Bed Mobility: Supine to Sit;Sit to Supine   Supine to sit: Total assist(x3) Sit to supine: Total assist(x4)   General bed mobility comments: use of bed sheet to assist pt with semi-supine to/from sit; requiring assist for trunk, LE's and lines management; max vc's; 3-4 assist  Transfers Overall transfer level: Needs assistance   Transfers: Lateral/Scoot Transfers          Lateral/Scoot Transfers: Total assist(x4) General transfer comment: max vc's for technique; minimal initiation of B UE and no initiation of B LE when attempting lateral scoot with 3 physical assist plus additional 4th assist for lines  Ambulation/Gait             General Gait Details: Not appropriate at this time   Stairs             Wheelchair Mobility    Modified Rankin (Stroke Patients Only)       Balance Overall balance assessment: Needs assistance Sitting-balance support: Feet supported;Bilateral upper extremity supported Sitting balance-Leahy Scale: Poor Sitting balance - Comments: Initially max assist for sitting balance but improved to very briefly close SBA; pt tending with posterior and L lateral lean; pt fluctuating with assist levels; vc's/assist required to shift weight forward and vc's/visual cues provided for midline positioning Postural control: Posterior lean;Left lateral lean  Standing balance comment: Not appropriate to attempt at this time                            Cognition Arousal/Alertness: Awake/alert Behavior During Therapy: Restless Overall Cognitive Status: Difficult to assess(Oriented to name and DOB)                                  General Comments: Patient with vent/trach but able to verbalize name/DOB; attempts to mouth words/communicate but difficult to understand; inconsistent yes/no responses; able to follow simple commands 25-50% of the time      Exercises     General Comments General comments (skin integrity, edema, etc.): PEG tube, vent trach, flexi-seal, foley catheter, and multiple other lines in place and monitored throughout session.  Nursing cleared pt for participation in physical therapy.  Pt agreeable to PT session.  Nursing present towards end of session (when pt still sitting edge of bed) and nurse stopped PEG tube feedings and assisted with lateral scoot attempt and returning pt to bed.      Pertinent Vitals/Pain Pain Assessment: Faces Faces Pain Scale: Hurts a little bit Pain Location: unable to verbalize where Pain Descriptors / Indicators: Grimacing Pain Intervention(s): Limited activity within patient's tolerance;Monitored during session;Repositioned    Home Living Family/patient expects to be discharged to:: Group home                    Prior Function          PT Goals (current goals can now be found in the care plan section) Acute Rehab PT Goals PT Goal Formulation: With patient Time For Goal Achievement: 02/11/18 Potential to Achieve Goals: Fair Progress towards PT goals: Progressing toward goals    Frequency    Min 2X/week      PT Plan Current plan remains appropriate    Co-evaluation PT/OT/SLP Co-Evaluation/Treatment: Yes Reason for Co-Treatment: Complexity of the patient's impairments (multi-system involvement);Necessary to address cognition/behavior during functional activity;For patient/therapist safety;To address functional/ADL transfers PT goals addressed during session: Mobility/safety with mobility;Balance;Strengthening/ROM OT goals addressed during session: ADL's and self-care;Strengthening/ROM       AM-PAC PT "6 Clicks" Daily Activity  Outcome Measure  Difficulty turning over in bed (including adjusting bedclothes, sheets and blankets)?: Unable Difficulty moving from lying on back to sitting on the side of the bed? : Unable Difficulty sitting down on and standing up from a chair with arms (e.g., wheelchair, bedside commode, etc,.)?: Unable Help needed moving to and from a bed to chair (including a wheelchair)?: Total Help needed walking in hospital room?: Total Help needed climbing 3-5 steps with a railing? : Total 6 Click Score: 6    End of Session Equipment Utilized During Treatment: Oxygen(vent trach) Activity Tolerance: Patient tolerated treatment well Patient left: in bed;with bed alarm set;with call bell/phone within reach;with nursing/sitter in room;Other (comment)(B heels elevated via pillow) Nurse Communication: Mobility status PT Visit Diagnosis: Muscle weakness (generalized) (M62.81);Difficulty in walking, not elsewhere classified (R26.2);Pain;Other abnormalities of gait and mobility (R26.89)     Time: 1610-9604 PT Time Calculation (min) (ACUTE ONLY): 41 min  Charges:  $Therapeutic Activity: 23-37 mins                    G CodesHendricks Limes, PT 01/30/18, 4:43 PM [phone]

## 2018-01-30 NOTE — Progress Notes (Signed)
[facility]  SUBJECTIVE: Patient laying in bed, complaining of chest pain or shortness of breath   Vitals:   01/30/18 0500 01/30/18 0600 01/30/18 0700 01/30/18 0800  BP: 139/65 (!) 147/88 (!) 172/92 (!) 155/76  Pulse: 80 79 82 88  Resp: (!) 22 16 (!) 23 20  Temp: 98.4 F (36.9 C) 98.4 F (36.9 C) 99.7 F (37.6 C) (!) 100.8 F (38.2 C)  TempSrc:  Bladder Bladder Bladder  SpO2: 99% 98% 99% 99%  Weight:      Height:         Intake/Output Summary (Last 24 hours) at 01/30/2018 0843 Last data filed at 01/30/2018 1610 Gross per 24 hour  Intake 2015.58 ml  Output 6675 ml  Net -4659.42 ml      PHYSICAL EXAM  General: Well developed, well nourished, in no acute distress HEENT:  Normocephalic and atramatic Neck:  No JVD.  Lungs: Clear bilaterally to auscultation and percussion. Heart: HRRR . Normal S1 and S2 without gallops or murmurs.  Abdomen: Bowel sounds are positive, abdomen soft and non-tender  Msk:  Back normal, normal gait. Normal strength and tone for age. Extremities: No clubbing, cyanosis or edema.   Neuro: Alert and oriented X 3. Psych:  Good affect, responds appropriately   LABS: Basic Metabolic Panel: Recent Labs    01/28/18 0401  01/29/18 1238 01/30/18 0458  NA 140   < > 138 138  K 3.5   < > 3.4* 3.7  CL 96*   < > 92* 92*  CO2 40*   < > 39* 40*  GLUCOSE 191*   < > 169* 130*  BUN 22*   < > 14 14  CREATININE 0.76   < > 0.70 0.71  CALCIUM 8.1*   < > 8.2* 8.1*  MG 1.7  --  1.4* 1.8  PHOS 3.3  --   --  3.2   < > = values in this interval not displayed.   Liver Function Tests: Recent Labs    01/30/18 0458  AST 33  ALT 21  ALKPHOS 68  BILITOT 0.3  PROT 5.8*  ALBUMIN 2.0*   No results for input(s): LIPASE, AMYLASE in the last 72 hours. CBC: Recent Labs    01/28/18 0401 01/29/18 0448  WBC 11.7* 11.1*  NEUTROABS 8.0* 7.7*  HGB 7.4* 7.9*  HCT 23.1* 24.4*  MCV 84.6 84.3  PLT 200 210   Cardiac Enzymes: No results for input(s): CKTOTAL,  CKMB, CKMBINDEX, TROPONINI in the last 72 hours. BNP: Invalid input(s): POCBNP D-Dimer: No results for input(s): DDIMER in the last 72 hours. Hemoglobin A1C: Recent Labs    01/28/18 0401  HGBA1C 7.4*   Fasting Lipid Panel: No results for input(s): CHOL, HDL, LDLCALC, TRIG, CHOLHDL, LDLDIRECT in the last 72 hours. Thyroid Function Tests: No results for input(s): TSH, T4TOTAL, T3FREE, THYROIDAB in the last 72 hours.  Invalid input(s): FREET3 Anemia Panel: No results for input(s): VITAMINB12, FOLATE, FERRITIN, TIBC, IRON, RETICCTPCT in the last 72 hours.  Dg Chest Port 1 View  Result Date: 01/29/2018 CLINICAL DATA:  Shortness of breath EXAM: PORTABLE CHEST 1 VIEW COMPARISON:  January 28, 2018 FINDINGS: Tracheostomy catheter tip is 5.0 cm above the carina. Central catheter tip is in the superior vena cava. No pneumothorax. There is diffuse airspace opacity throughout the lungs bilaterally, similar to 1 day prior. There are small pleural effusions bilaterally. No new opacity evident. Heart is enlarged with pulmonary venous hypertension. No adenopathy appreciable. No bone lesions. IMPRESSION:  Tube and catheter positions as described without pneumothorax. Cardiomegaly with pulmonary venous hypertension, consistent with pulmonary vascular congestion. Small pleural effusions bilaterally. Widespread airspace opacity bilaterally may represent alveolar edema or pneumonia. Both entities may exist concurrently. A degree of underlying ARDS is also possible. The overall appearance is essentially stable compared to 1 day prior. Electronically Signed   By: [name].   On: 01/29/2018 07:45     Echo LVEF 20 to 25%  TELEMETRY: Sinus rhythm:  ASSESSMENT AND PLAN:  Principal Problem:   Severe sepsis with septic shock (HCC) Active Problems:   GI bleed   Acute respiratory failure with hypoxia (HCC)   Multifocal pneumonia   UTI (urinary tract infection)   AKI (acute kidney injury) (HCC)    Ileus (HCC)   Diabetes (HCC)   Pressure injury of skin    1.  Paroxysmal atrial fibrillation, improved after the addition of digoxin 2.  Dilated cardiomyopathy 3.  Chronic systolic congestive heart failure 4.  Respiratory failure, status post tracheostomy  Recommendations  1.  Continue current medications 2.  Continue diuresis 3.  Carefully monitor renal status 4.  Continue amiodarone, metoprolol tartrate and digoxin rate and rhythm control   Marcina Millard, MD, PhD, [facility] 01/30/2018 8:43 AM

## 2018-01-30 NOTE — Clinical Social Work Note (Signed)
CSW contacted [facility] and [facility] and [place] and both are reviewing patient's referral. York Spaniel MSW,LCSW [phone]

## 2018-01-30 NOTE — Progress Notes (Signed)
Name: Tommy Mathews MRN: [id number] DOB: 1961-01-18     CONSULTATION DATE: 01/06/2018  Subjective & Objectives: Improved agitation and less episodes of tachyarrythmia   PAST MEDICAL HISTORY :   has a past medical history of Diabetes (HCC), GERD (gastroesophageal reflux disease), HLD (hyperlipidemia), and HTN (hypertension).  has a past surgical history that includes PEG placement (N/A, 01/21/2018) and Tracheostomy tube placement (N/A, 01/23/2018). Prior to Admission medications   Medication Sig Start Date End Date Taking? Authorizing Provider  atorvastatin (LIPITOR) 80 MG tablet Take 80 mg by mouth at bedtime.   Yes [provider]  divalproex (DEPAKOTE ER) 500 MG 24 hr tablet Take 500 mg by mouth 2 (two) times daily.   Yes [provider]  estradiol (ESTRACE) 2 MG tablet Take 2 mg by mouth daily.   Yes [provider]  FLUoxetine (PROZAC) 40 MG capsule Take 40 mg by mouth daily.   Yes [provider]  folic acid (FOLVITE) 800 MCG tablet Take 800 mcg by mouth daily.   Yes [provider]  hydrochlorothiazide (HYDRODIURIL) 50 MG tablet Take 50 mg by mouth daily.   Yes [provider]  lisinopril (PRINIVIL,ZESTRIL) 5 MG tablet Take 5 mg by mouth daily.   Yes [provider]  metFORMIN (GLUCOPHAGE) 1000 MG tablet Take 1,000 mg by mouth 2 (two) times daily with a meal.   Yes [provider]  methotrexate (RHEUMATREX) 2.5 MG tablet Take 15 mg by mouth once a week.   Yes [provider]  metoprolol tartrate (LOPRESSOR) 50 MG tablet Take 50 mg by mouth 2 (two) times daily.   Yes [provider]  Omega-3 Fatty Acids (FISH OIL) 1000 MG CAPS Take 1,000 mg by mouth daily.   Yes [provider]  QUEtiapine Fumarate (SEROQUEL PO) Take 500 mg by mouth 2 (two) times daily.   Yes [provider]  ranitidine (ZANTAC) 150 MG tablet Take 150 mg by mouth daily.   Yes [provider]    rivaroxaban (XARELTO) 10 MG TABS tablet Take 10 mg by mouth daily.   Yes [provider]  senna-docusate (SENOKOT-S) 8.6-50 MG tablet Take 2 tablets by mouth at bedtime.   Yes [provider]  sodium chloride 1 g tablet Take 1 g by mouth daily.   Yes [provider]   No Known Allergies  FAMILY HISTORY:  family history is not on file. SOCIAL HISTORY:  reports that he has been smoking cigarettes.  He started smoking about 19 years ago. He has a 40.00 pack-year smoking history. He has never used smokeless tobacco. He reports that he does not drink alcohol or use drugs.  REVIEW OF SYSTEMS:   Unable to obtain due to critical illness   VITAL SIGNS: Temp:  [98.1 F (36.7 C)-100.8 F (38.2 C)] 98.4 F (36.9 C) (05/02 1400) Pulse Rate:  [74-99] 78 (05/02 1400) Resp:  [9-31] 19 (05/02 1400) BP: (113-172)/(63-127) 139/71 (05/02 1400) SpO2:  [87 %-100 %] 98 % (05/02 1512) FiO2 (%):  [50 %] 50 % (05/02 1512)  Physical Examination:  Awake, moving extremities and not following commands Trach in place on vent and no distress, BEAE and diffuse medium crackleswith small thick grey secretions S1 & S2 audible and no murmur Benign abdomen with normal peristalses Anasarca and scrotal edema 1 +  ASSESSMENT / PLAN: Chronic respiratory failure toleratingCPAP/PS 07/06/39% -Vent and trach weaning as tolerated.  A fib with RVR, CHF with dilated cardiomyopathy with LV EF 20%.  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 Subtherapeutic Valproic acid (improved) -Optimize meds and monitor level.  Pneumonia with aspiration. worsening bil. Airspace disease with pulmonary edema -Completed ABX. -Monitor CXR + CBC + FIO2.\ -Started on Acetylcyst + Bronchodilators Neb because of thick respiratory secretions. -Diuresis to improve lung  compliance.  HTN -Optimize antihypertensives and monitor hemodynamics  DM -Glycemic control  AKIand mild prerenal azotemia. -Monitor renal panle  Fecal impaction and constipation (resolved). Ileus on KUB -Senokot-S -Reglan  Anemiawith HB drop 1.2 gm within the last 72 h on Xeralto. H/o GI bleeding -Keep HB > 7 gm/dl. -Hold Xeralto -Follow with stool for occult blood  Dysphagia -PEG for feeding  Full code  DVT & GI Prophylaxis. Continue with supportive care  Critical care time 35 min

## 2018-01-30 NOTE — Progress Notes (Signed)
Patient ID: Tommy Mathews, male   DOB: [birth year], 57 y.o.   MRN: [id number]  [employer] [facility] PROGRESS NOTE  Tommy Mathews NAT:[id number] DOB: [birth year] DOA: 01/06/2018 PCP: Isaias Cowman, MD  HPI/Subjective: Patient is more alert today.  Answers a few questions.  Follows a few simple commands.  Less episodes of SVT.  Objective: Vitals:   01/30/18 1400 01/30/18 1512  BP: 139/71   Pulse: 78   Resp: 19   Temp: 98.4 F (36.9 C)   SpO2: 96% 98%    Filed Weights   01/19/18 0347 01/20/18 1200 01/26/18 0418  Weight: (!) 136.7 kg (301 lb 5.9 oz) (!) 139.5 kg (307 lb 8.7 oz) 133.2 kg (293 lb 10.4 oz)    ROS: Review of Systems  Unable to perform ROS: Acuity of condition  Cardiovascular: Negative for chest pain.  Gastrointestinal: Negative for abdominal pain.   Exam: Physical Exam  Constitutional: He appears lethargic.  HENT:  Nose: No mucosal edema.  Eyes: Pupils are equal, round, and reactive to light. Conjunctivae and lids are normal.  Neck: Carotid bruit is not present. No thyroid mass present.  Cardiovascular: Regular rhythm, S1 normal, S2 normal and normal heart sounds.  Respiratory: He has decreased breath sounds in the right lower field and the left lower field. He has no wheezes. He has rhonchi in the right middle field, the right lower field, the left middle field and the left lower field. He has no rales.  GI: Soft. Bowel sounds are normal. There is tenderness.  Genitourinary:  Genitourinary Comments: 4+ scrotal edema, penile edema  Musculoskeletal:       Right ankle: He exhibits swelling.       Left ankle: He exhibits swelling.  Neurological: He appears lethargic.  Sedated  Skin: Skin is warm. No rash noted. Nails show no clubbing.  Psychiatric: His affect is blunt.      Data Reviewed: Basic Metabolic Panel: Recent Labs  Lab 01/25/18 0531 01/26/18 0447 01/27/18 0455 01/28/18 0401 01/29/18 0448 01/29/18 1238 01/30/18 0458  NA 142 142 142 140 137  138 138  K 3.8 4.1 4.0 3.5 3.5 3.4* 3.7  CL 103 101 98* 96* 95* 92* 92*  CO2 35* 37* 38* 40* 37* 39* 40*  GLUCOSE 186* 104* 77 191* 137* 169* 130*  BUN 23* 21* 20 22* '16 14 14  ' CREATININE 0.64 0.60* 0.79 0.76 0.69 0.70 0.71  CALCIUM 8.1* 8.1* 8.2* 8.1* 8.1* 8.2* 8.1*  MG 1.5* 1.7 1.7 1.7  --  1.4* 1.8  PHOS 3.6 3.5  --  3.3  --   --  3.2   Liver Function Tests: Recent Labs  Lab 01/26/18 0447 01/30/18 0458  AST 23 33  ALT 19 21  ALKPHOS 74 68  BILITOT 0.3 0.3  PROT 5.2* 5.8*  ALBUMIN 1.6* 2.0*   CBC: Recent Labs  Lab 01/26/18 0447 01/27/18 0455 01/28/18 0401 01/29/18 0448 01/30/18 1454  WBC 8.9 11.4* 11.7* 11.1*  --   NEUTROABS 5.9 7.6* 8.0* 7.7*  --   HGB 8.4* 8.3* 7.4* 7.9* 7.1*  HCT 25.5* 25.6* 23.1* 24.4* 22.3*  MCV 84.9 84.1 84.6 84.3  --   PLT 162 205 200 210  --    BNP (last 3 results) Recent Labs    01/06/18 1944 01/16/18 2332  BNP 37.0 402.0*     CBG: Recent Labs  Lab 01/29/18 1935 01/29/18 2334 01/30/18 0412 01/30/18 0742 01/30/18 1144  GLUCAP 162* 132* 116* 146* 152*     Studies:  Dg Chest Port 1 View  Result Date: 01/30/2018 CLINICAL DATA:  Congestive heart failure EXAM: PORTABLE CHEST 1 VIEW COMPARISON:  Jan 29, 2018 FINDINGS: Tracheostomy catheter tip is 5.6 cm above the carina. Central catheter tip is in the superior vena cava. No pneumothorax. There remains cardiomegaly with pulmonary venous hypertension. There is patchy interstitial and alveolar opacity, likely edema. No adenopathy. No bone lesions. IMPRESSION: Tube and catheter positions as described without pneumothorax. Pulmonary vascular congestion with interstitial and alveolar edema, stable from 1 day prior. No new opacity evident. Electronically Signed   By: Lowella Grip III M.D.   On: 01/30/2018 09:07   Dg Chest Port 1 View  Result Date: 01/29/2018 CLINICAL DATA:  Shortness of breath EXAM: PORTABLE CHEST 1 VIEW COMPARISON:  January 28, 2018 FINDINGS: Tracheostomy catheter tip is  5.0 cm above the carina. Central catheter tip is in the superior vena cava. No pneumothorax. There is diffuse airspace opacity throughout the lungs bilaterally, similar to 1 day prior. There are small pleural effusions bilaterally. No new opacity evident. Heart is enlarged with pulmonary venous hypertension. No adenopathy appreciable. No bone lesions. IMPRESSION: Tube and catheter positions as described without pneumothorax. Cardiomegaly with pulmonary venous hypertension, consistent with pulmonary vascular congestion. Small pleural effusions bilaterally. Widespread airspace opacity bilaterally may represent alveolar edema or pneumonia. Both entities may exist concurrently. A degree of underlying ARDS is also possible. The overall appearance is essentially stable compared to 1 day prior. Electronically Signed   By: Lowella Grip III M.D.   On: 01/29/2018 07:45    Scheduled Meds: . acetylcysteine  3 mL Nebulization Q8H  . amiodarone  400 mg Per Tube BID  . atorvastatin  80 mg Per Tube q1800  . budesonide (PULMICORT) nebulizer solution  0.5 mg Nebulization BID  . chlorhexidine gluconate (MEDLINE KIT)  15 mL Mouth Rinse BID  . clonazePAM  0.5 mg Per Tube BID  . digoxin  0.25 mg Per Tube Daily  . docusate  100 mg Per Tube BID  . fentaNYL  75 mcg Transdermal Q72H  . FLUoxetine  40 mg Per Tube Daily  . free water  200 mL Per Tube Q8H  . hydrochlorothiazide  25 mg Oral Daily  . insulin aspart  0-20 Units Subcutaneous Q4H  . insulin glargine  20 Units Subcutaneous QHS  . ipratropium-albuterol  3 mL Nebulization Q8H  . lisinopril  5 mg Per Tube Daily  . mouth rinse  15 mL Mouth Rinse 10 times per day  . metoCLOPramide  10 mg Oral Q12H  . metoprolol tartrate  25 mg Per Tube Q6H  . nystatin  5 mL Oral QID  . pantoprazole sodium  40 mg Per Tube Daily  . rivaroxaban  10 mg Per Tube Daily  . Valproate Sodium  1,000 mg Per Tube BID   Continuous Infusions: . feeding supplement (GLUCERNA 1.5 CAL)  1,000 mL (01/30/18 0721)    Assessment/Plan:  1. Acute hypoxic respiratory failure.  Status post tracheostomy and is on ventilator at 50% FiO2.  Will need long-term weaning In the facility that can do this. 2. Atrial fibrillation with rapid ventricular response, episodes of SVT.  Patient on amiodarone, digoxin and metoprolol.   less episodes of SVT with the increased dose of amiodarone. 3. Acute on chronic systolic congestive heart failure with anasarca and scrotal edema.  On IV Lasix now. 4. Acute encephalopathy.   history of traumatic brain injury. 5. Septic shock secondary to pneumonia and urinary tract infection.  Patient is off vasopressors and finished antibiotics. 6. Gastrointestinal bleed. on PPI.  Endoscopy negative.  Need to watch hemoglobin closely now that the patient is on Xarelto. 7. Acute kidney injury resolved. 8. Type 2 diabetes mellitus on glargine insulin and sliding scale 9. Nutrition on PEG tube  feeding 10. Depression on Prozac and Depakene. 11. Thrush on nystatin swish and swallow 12. Hyperlipidemia unspecified on atorvastatin  Code Status:     Code Status Orders  (From admission, onward)        Start     Ordered   01/06/18 2227  Full code  Continuous     01/06/18 2226    Code Status History    This patient has a current code status but no historical code status.     Family Communication:  as per critical care specialist Disposition Plan:  will need long-term placement on a facility that can handle a ventilator  Consultants:  Critical care specialist  Cardiology  Time spent: 24 minutes  [place]

## 2018-01-31 ENCOUNTER — Inpatient Hospital Stay: Payer: Medicaid Other

## 2018-01-31 LAB — CBC WITH DIFFERENTIAL/PLATELET
BASOS PCT: 0 %
BLASTS: 0 %
Band Neutrophils: 60 %
Basophils Absolute: 0 10*3/uL (ref 0–0.1)
Eosinophils Absolute: 0 10*3/uL (ref 0–0.7)
Eosinophils Relative: 0 %
HEMATOCRIT: 27.2 % — AB (ref 40.0–52.0)
HEMOGLOBIN: 9.3 g/dL — AB (ref 13.0–18.0)
LYMPHS PCT: 1 %
Lymphs Abs: 0.1 10*3/uL — ABNORMAL LOW (ref 1.0–3.6)
MCH: 28.6 pg (ref 26.0–34.0)
MCHC: 34.2 g/dL (ref 32.0–36.0)
MCV: 83.7 fL (ref 80.0–100.0)
MONO ABS: 0.1 10*3/uL — AB (ref 0.2–1.0)
MYELOCYTES: 1 %
Metamyelocytes Relative: 3 %
Monocytes Relative: 1 %
NEUTROS PCT: 34 %
NRBC: 2 /100{WBCs} — AB
Neutro Abs: 12.4 10*3/uL — ABNORMAL HIGH (ref 1.4–6.5)
Other: 0 %
PROMYELOCYTES RELATIVE: 0 %
Platelets: 273 10*3/uL (ref 150–440)
RBC: 3.25 MIL/uL — ABNORMAL LOW (ref 4.40–5.90)
RDW: 17.5 % — ABNORMAL HIGH (ref 11.5–14.5)
WBC: 12.6 10*3/uL — ABNORMAL HIGH (ref 3.8–10.6)

## 2018-01-31 LAB — GLUCOSE, CAPILLARY
GLUCOSE-CAPILLARY: 158 mg/dL — AB (ref 65–99)
Glucose-Capillary: 169 mg/dL — ABNORMAL HIGH (ref 65–99)
Glucose-Capillary: 98 mg/dL (ref 65–99)
Glucose-Capillary: 134 mg/dL — ABNORMAL HIGH (ref 65–99)
Glucose-Capillary: 149 mg/dL — ABNORMAL HIGH (ref 65–99)
Glucose-Capillary: 140 mg/dL — ABNORMAL HIGH (ref 65–99)
Glucose-Capillary: 132 mg/dL — ABNORMAL HIGH (ref 65–99)

## 2018-01-31 LAB — BLOOD GAS, ARTERIAL
Acid-Base Excess: 15.6 mmol/L — ABNORMAL HIGH (ref 0.0–2.0)
Bicarbonate: 42.2 mmol/L — ABNORMAL HIGH (ref 20.0–28.0)
FIO2: 1
O2 Saturation: 97.8 %
PEEP: 12 cmH2O
Patient temperature: 37
RATE: 14 resp/min
VT: 490 mL
pCO2 arterial: 58 mmHg — ABNORMAL HIGH (ref 32.0–48.0)
pH, Arterial: 7.47 — ABNORMAL HIGH (ref 7.350–7.450)
pO2, Arterial: 94 mmHg (ref 83.0–108.0)

## 2018-01-31 LAB — BASIC METABOLIC PANEL
Anion gap: 7 (ref 5–15)
BUN: 18 mg/dL (ref 6–20)
CO2: 38 mmol/L — ABNORMAL HIGH (ref 22–32)
Calcium: 8.2 mg/dL — ABNORMAL LOW (ref 8.9–10.3)
Chloride: 91 mmol/L — ABNORMAL LOW (ref 101–111)
Creatinine, Ser: 0.89 mg/dL (ref 0.61–1.24)
GFR calc Af Amer: >60 mL/min (ref >60)
Glucose, Bld: 107 mg/dL — ABNORMAL HIGH (ref 65–99)
Potassium: 3.7 mmol/L (ref 3.5–5.1)
SODIUM: 136 mmol/L (ref 135–145)

## 2018-01-31 LAB — MAGNESIUM: MAGNESIUM: 1.4 mg/dL — AB (ref 1.7–2.4)

## 2018-01-31 LAB — PHOSPHORUS: PHOSPHORUS: 3.2 mg/dL (ref 2.5–4.6)

## 2018-01-31 LAB — DIGOXIN LEVEL: Digoxin Level: 0.8 ng/mL (ref 0.8–2.0)

## 2018-01-31 LAB — CALCIUM, IONIZED: Calcium, Ionized, Serum: 4.8 mg/dL (ref 4.5–5.6)

## 2018-01-31 LAB — LACTIC ACID, PLASMA
LACTIC ACID, VENOUS: 1.3 mmol/L (ref 0.5–1.9)
Lactic Acid, Venous: 2.2 mmol/L (ref 0.5–1.9)

## 2018-01-31 MED ORDER — VANCOMYCIN HCL IN DEXTROSE 1-5 GM/200ML-% IV SOLN
1000.0000 mg | INTRAVENOUS | Status: AC
  Administered 2018-01-31: 1000 mg via INTRAVENOUS
  Filled 2018-01-31: qty 200

## 2018-01-31 MED ORDER — LABETALOL HCL 5 MG/ML IV SOLN
10.0000 mg | Freq: Once | INTRAVENOUS | Status: AC
  Administered 2018-01-31: 10 mg via INTRAVENOUS
  Filled 2018-01-31: qty 4

## 2018-01-31 MED ORDER — ALTEPLASE 2 MG IJ SOLR
2.0000 mg | Freq: Once | INTRAMUSCULAR | Status: AC
  Administered 2018-01-31: 2 mg

## 2018-01-31 MED ORDER — LORAZEPAM 2 MG/ML IJ SOLN
1.0000 mg | Freq: Once | INTRAMUSCULAR | Status: AC
  Administered 2018-01-31: 1 mg via INTRAVENOUS

## 2018-01-31 MED ORDER — LORAZEPAM 2 MG/ML IJ SOLN
2.0000 mg | Freq: Once | INTRAMUSCULAR | Status: AC
  Administered 2018-01-31: 2 mg via INTRAMUSCULAR
  Filled 2018-01-31: qty 1

## 2018-01-31 MED ORDER — ACETAMINOPHEN 650 MG RE SUPP: 650.0000 mg | RECTAL | Status: DC | PRN

## 2018-01-31 MED ORDER — SODIUM CHLORIDE 0.9 % IV SOLN
100.0000 mg | INTRAVENOUS | Status: DC
  Administered 2018-02-02 – 2018-02-03 (×2): 100 mg via INTRAVENOUS
  Filled 2018-01-31 (×4): qty 100

## 2018-01-31 MED ORDER — SODIUM CHLORIDE 0.9 % IV SOLN
1.0000 g | Freq: Three times a day (TID) | INTRAVENOUS | Status: AC
  Administered 2018-01-31 – 2018-02-07 (×23): 1 g via INTRAVENOUS
  Filled 2018-01-31 (×25): qty 1

## 2018-01-31 MED ORDER — MIDAZOLAM HCL 2 MG/2ML IJ SOLN
2.0000 mg | Freq: Once | INTRAMUSCULAR | Status: AC
  Administered 2018-01-31: 2 mg via INTRAVENOUS

## 2018-01-31 MED ORDER — LABETALOL HCL 5 MG/ML IV SOLN
INTRAVENOUS | Status: AC
  Administered 2018-01-31: 10 mg via INTRAVENOUS
  Filled 2018-01-31: qty 4

## 2018-01-31 MED ORDER — POTASSIUM CHLORIDE 20 MEQ PO PACK
20.0000 meq | PACK | Freq: Once | ORAL | Status: AC
Start: 2018-01-31 — End: 2018-01-31
  Administered 2018-01-31: 20 meq via ORAL
  Filled 2018-01-31: qty 1

## 2018-01-31 MED ORDER — FUROSEMIDE 10 MG/ML IJ SOLN
20.0000 mg | Freq: Two times a day (BID) | INTRAMUSCULAR | Status: AC
  Administered 2018-01-31 – 2018-02-01 (×2): 20 mg via INTRAVENOUS
  Filled 2018-01-31 (×2): qty 2

## 2018-01-31 MED ORDER — MIDAZOLAM HCL 2 MG/2ML IJ SOLN
INTRAMUSCULAR | Status: AC
  Administered 2018-01-31: 2 mg via INTRAVENOUS
  Filled 2018-01-31: qty 2

## 2018-01-31 MED ORDER — SODIUM CHLORIDE 0.9 % IV SOLN: 100.0000 mg | INTRAVENOUS | Status: DC

## 2018-01-31 MED ORDER — DEXMEDETOMIDINE HCL IN NACL 400 MCG/100ML IV SOLN
0.4000 ug/kg/h | INTRAVENOUS | Status: DC
  Administered 2018-01-31: 1.2 ug/kg/h via INTRAVENOUS
  Administered 2018-01-31: 0.2 ug/kg/h via INTRAVENOUS
  Administered 2018-01-31: 0.4 ug/kg/h via INTRAVENOUS
  Administered 2018-02-01: 0.5 ug/kg/h via INTRAVENOUS
  Administered 2018-02-02: 0.8 ug/kg/h via INTRAVENOUS
  Administered 2018-02-02: 0.4 ug/kg/h via INTRAVENOUS
  Administered 2018-02-02: 0.5 ug/kg/h via INTRAVENOUS
  Administered 2018-02-03: 1 ug/kg/h via INTRAVENOUS
  Administered 2018-02-03: 0.8 ug/kg/h via INTRAVENOUS
  Administered 2018-02-03: 1 ug/kg/h via INTRAVENOUS
  Administered 2018-02-03: 0.8 ug/kg/h via INTRAVENOUS
  Administered 2018-02-03: 0.6 ug/kg/h via INTRAVENOUS
  Administered 2018-02-03: 0.8 ug/kg/h via INTRAVENOUS
  Administered 2018-02-04 (×4): 1 ug/kg/h via INTRAVENOUS
  Administered 2018-02-04: 0.8 ug/kg/h via INTRAVENOUS
  Administered 2018-02-04: 0.6 ug/kg/h via INTRAVENOUS
  Administered 2018-02-05: 1 ug/kg/h via INTRAVENOUS
  Administered 2018-02-05 (×2): 0.8 ug/kg/h via INTRAVENOUS
  Administered 2018-02-05 (×2): 1 ug/kg/h via INTRAVENOUS
  Administered 2018-02-06: 0.8 ug/kg/h via INTRAVENOUS
  Administered 2018-02-06 – 2018-02-07 (×11): 1.2 ug/kg/h via INTRAVENOUS
  Administered 2018-02-07: 1 ug/kg/h via INTRAVENOUS
  Administered 2018-02-07: 1.2 ug/kg/h via INTRAVENOUS
  Administered 2018-02-07: 1 ug/kg/h via INTRAVENOUS
  Administered 2018-02-07: 1.2 ug/kg/h via INTRAVENOUS
  Administered 2018-02-07 – 2018-02-08 (×5): 1 ug/kg/h via INTRAVENOUS
  Administered 2018-02-08 (×2): 0.8 ug/kg/h via INTRAVENOUS
  Administered 2018-02-08: 1 ug/kg/h via INTRAVENOUS
  Administered 2018-02-09: 0.6 ug/kg/h via INTRAVENOUS
  Administered 2018-02-09 (×2): 0.8 ug/kg/h via INTRAVENOUS
  Administered 2018-02-09: 0.9 ug/kg/h via INTRAVENOUS
  Filled 2018-01-31 (×54): qty 100

## 2018-01-31 MED ORDER — LABETALOL HCL 5 MG/ML IV SOLN
10.0000 mg | Freq: Once | INTRAVENOUS | Status: AC
  Administered 2018-01-31: 10 mg via INTRAVENOUS

## 2018-01-31 MED ORDER — VANCOMYCIN HCL 10 G IV SOLR
1250.0000 mg | Freq: Three times a day (TID) | INTRAVENOUS | Status: DC
  Filled 2018-01-31 (×3): qty 1250

## 2018-01-31 MED ORDER — MAGNESIUM SULFATE 4 GM/100ML IV SOLN
4.0000 g | Freq: Once | INTRAVENOUS | Status: AC
Start: 2018-01-31 — End: 2018-01-31
  Administered 2018-01-31: 4 g via INTRAVENOUS
  Filled 2018-01-31 (×2): qty 100

## 2018-01-31 MED ORDER — VANCOMYCIN HCL 10 G IV SOLR
1250.0000 mg | Freq: Three times a day (TID) | INTRAVENOUS | Status: DC
  Administered 2018-01-31 – 2018-02-01 (×3): 1250 mg via INTRAVENOUS
  Filled 2018-01-31 (×6): qty 1250

## 2018-01-31 MED ORDER — ANIDULAFUNGIN 100 MG IV SOLR
200.0000 mg | Freq: Once | INTRAVENOUS | Status: AC
  Administered 2018-01-31: 200 mg via INTRAVENOUS
  Filled 2018-01-31: qty 200

## 2018-01-31 NOTE — Progress Notes (Signed)
[facility] Cardiology  SUBJECTIVE: Laying in bed, status post tracheostomy on ventilator   Vitals:   01/31/18 0500 01/31/18 0600 01/31/18 0700 01/31/18 0719  BP: (!) 144/83 139/78 (!) 146/92   Pulse: 92 90 92 92  Resp: (!) 22 (!) 25 (!) 25 (!) 40  Temp: (!) 102.7 F (39.3 C) (!) 101.7 F (38.7 C) (!) 101.5 F (38.6 C) (!) 101.7 F (38.7 C)  TempSrc:      SpO2: 97% 97% 96% 93%  Weight:      Height:         Intake/Output Summary (Last 24 hours) at 01/31/2018 0748 Last data filed at 01/31/2018 0600 Gross per 24 hour  Intake 2295 ml  Output 4220 ml  Net -1925 ml      PHYSICAL EXAM  General: Patient laying in bed, status post tracheostomy on ventilator HEENT: Status post tracheostomy Neck:  No JVD.  Lungs: Clear bilaterally to auscultation and percussion. Heart: Irregularly irregular rhythm Abdomen: Bowel sounds are positive, abdomen soft and non-tender  Msk:  Back normal, normal gait. Normal strength and tone for age. Extremities: No clubbing, cyanosis or edema.   Neuro: Alert and oriented X 3. Psych:  Good affect, responds appropriately   LABS: Basic Metabolic Panel: Recent Labs    01/29/18 1238 01/30/18 0458  NA 138 138  K 3.4* 3.7  CL 92* 92*  CO2 39* 40*  GLUCOSE 169* 130*  BUN 14 14  CREATININE 0.70 0.71  CALCIUM 8.2* 8.1*  MG 1.4* 1.8  PHOS  --  3.2   Liver Function Tests: Recent Labs    01/30/18 0458  AST 33  ALT 21  ALKPHOS 68  BILITOT 0.3  PROT 5.8*  ALBUMIN 2.0*   No results for input(s): LIPASE, AMYLASE in the last 72 hours. CBC: Recent Labs    01/29/18 0448 01/30/18 1454  WBC 11.1*  --   NEUTROABS 7.7*  --   HGB 7.9* 7.1*  HCT 24.4* 22.3*  MCV 84.3  --   PLT 210  --    Cardiac Enzymes: No results for input(s): CKTOTAL, CKMB, CKMBINDEX, TROPONINI in the last 72 hours. BNP: Invalid input(s): POCBNP D-Dimer: No results for input(s): DDIMER in the last 72 hours. Hemoglobin A1C: No results for input(s): HGBA1C in the last 72  hours. Fasting Lipid Panel: No results for input(s): CHOL, HDL, LDLCALC, TRIG, CHOLHDL, LDLDIRECT in the last 72 hours. Thyroid Function Tests: No results for input(s): TSH, T4TOTAL, T3FREE, THYROIDAB in the last 72 hours.  Invalid input(s): FREET3 Anemia Panel: No results for input(s): VITAMINB12, FOLATE, FERRITIN, TIBC, IRON, RETICCTPCT in the last 72 hours.  Dg Chest Port 1 View  Result Date: 01/31/2018 CLINICAL DATA:  Congestive heart failure. EXAM: PORTABLE CHEST 1 VIEW COMPARISON:  Radiograph of Jan 30, 2018. FINDINGS: Stable cardiomediastinal silhouette. Tracheostomy tube and right sided PICC line are unchanged in position. Hypoinflation of the lungs is noted. Stable bilateral lung opacities are noted concerning for edema or pneumonia. No pneumothorax or significant pleural effusion is noted. Bony thorax is unremarkable. IMPRESSION: Stable support apparatus. Hypoinflation of the lungs. Stable bilateral lung opacities concerning for edema or pneumonia. Electronically Signed   By: Lupita Raider, M.D.   On: 01/31/2018 07:13   Dg Chest Port 1 View  Result Date: 01/30/2018 CLINICAL DATA:  Congestive heart failure EXAM: PORTABLE CHEST 1 VIEW COMPARISON:  Jan 29, 2018 FINDINGS: Tracheostomy catheter tip is 5.6 cm above the carina. Central catheter tip is in the superior vena  cava. No pneumothorax. There remains cardiomegaly with pulmonary venous hypertension. There is patchy interstitial and alveolar opacity, likely edema. No adenopathy. No bone lesions. IMPRESSION: Tube and catheter positions as described without pneumothorax. Pulmonary vascular congestion with interstitial and alveolar edema, stable from 1 day prior. No new opacity evident. Electronically Signed   By: [name].   On: 01/30/2018 09:07     Echo LVEF 20 to 25%  TELEMETRY: Atrial fibrillation at 100 bpm:  ASSESSMENT AND PLAN:  Principal Problem:   Severe sepsis with septic shock (HCC) Active Problems:   GI  bleed   Acute respiratory failure with hypoxia (HCC)   Multifocal pneumonia   UTI (urinary tract infection)   AKI (acute kidney injury) (HCC)   Ileus (HCC)   Diabetes (HCC)   Pressure injury of skin    1.  Paroxysmal atrial fibrillation, frequency and severity of episodes improved on digoxin and increased dose of amiodarone, still problematic due to respiratory failure 2.  Dilated cardiomyopathy 3.  Systolic congestive heart failure 4.  Respiratory failure, status post tracheostomy 5.  Sepsis/pneumonia/UTI 6.  GI bleed, stable, on Xarelto  Recommendations  1.  Continue current medications 2.  Continue diuresis 3.  Carefully monitor renal status 4.  Continue amiodarone 400 mg twice daily, will need titration to 200 mg twice daily or to discharge with close monitoring of digoxin levels   Marcina Millard, MD, PhD, [facility] 01/31/2018 7:48 AM

## 2018-01-31 NOTE — Progress Notes (Signed)
Patient ID: Tommy Mathews, male   DOB: March 25, 1961, 57 y.o.   MRN: [id number]  [employer] PROGRESS NOTE  Dorell Gatlin HOO:[id number] DOB: 1961/05/19 DOA: 01/06/2018 PCP: Isaias Cowman, MD  HPI/Subjective: Patient a little more lethargic today.  Ended up having to go up to 100%  FiO2 on the vent.  Objective: Vitals:   01/31/18 1300 01/31/18 1436  BP: 107/63   Pulse: 82 73  Resp: (!) 21 (!) 23  Temp:  99.4 F (37.4 C)  SpO2: 100% 100%    Filed Weights   01/19/18 0347 01/20/18 1200 01/26/18 0418  Weight: (!) 136.7 kg (301 lb 5.9 oz) (!) 139.5 kg (307 lb 8.7 oz) 133.2 kg (293 lb 10.4 oz)    ROS: Review of Systems  Unable to perform ROS: Acuity of condition   Exam: Physical Exam  Constitutional: He appears lethargic.  HENT:  Nose: No mucosal edema.  Eyes: Pupils are equal, round, and reactive to light. Conjunctivae and lids are normal.  Neck: Carotid bruit is not present. No thyroid mass present.  Cardiovascular: Regular rhythm, S1 normal, S2 normal and normal heart sounds.  Respiratory: He has decreased breath sounds in the right middle field, the right lower field, the left middle field and the left lower field. He has no wheezes. He has rhonchi in the right middle field, the right lower field, the left middle field and the left lower field. He has no rales.  GI: Soft. Bowel sounds are normal. There is tenderness.  Genitourinary:  Genitourinary Comments: 4+ scrotal edema, penile edema  Musculoskeletal:       Right ankle: He exhibits swelling.       Left ankle: He exhibits swelling.  Neurological: He appears lethargic.  Followed a few simple commands today.  Skin: Skin is warm. No rash noted. Nails show no clubbing.  Psychiatric: His affect is blunt.      Data Reviewed: Basic Metabolic Panel: Recent Labs  Lab 01/25/18 0531 01/26/18 0447 01/27/18 0455 01/28/18 0401 01/29/18 0448 01/29/18 1238 01/30/18 0458 01/31/18 0818  NA 142 142 142 140 137 138  138 136  K 3.8 4.1 4.0 3.5 3.5 3.4* 3.7 3.7  CL 103 101 98* 96* 95* 92* 92* 91*  CO2 35* 37* 38* 40* 37* 39* 40* 38*  GLUCOSE 186* 104* 77 191* 137* 169* 130* 107*  BUN 23* 21* 20 22* '16 14 14 18  ' CREATININE 0.64 0.60* 0.79 0.76 0.69 0.70 0.71 0.89  CALCIUM 8.1* 8.1* 8.2* 8.1* 8.1* 8.2* 8.1* 8.2*  MG 1.5* 1.7 1.7 1.7  --  1.4* 1.8 1.4*  PHOS 3.6 3.5  --  3.3  --   --  3.2 3.2   Liver Function Tests: Recent Labs  Lab 01/26/18 0447 01/30/18 0458  AST 23 33  ALT 19 21  ALKPHOS 74 68  BILITOT 0.3 0.3  PROT 5.2* 5.8*  ALBUMIN 1.6* 2.0*   CBC: Recent Labs  Lab 01/26/18 0447 01/27/18 0455 01/28/18 0401 01/29/18 0448 01/30/18 1454 01/31/18 0818  WBC 8.9 11.4* 11.7* 11.1*  --  12.6*  NEUTROABS 5.9 7.6* 8.0* 7.7*  --  12.4*  HGB 8.4* 8.3* 7.4* 7.9* 7.1* 9.3*  HCT 25.5* 25.6* 23.1* 24.4* 22.3* 27.2*  MCV 84.9 84.1 84.6 84.3  --  83.7  PLT 162 205 200 210  --  273   BNP (last 3 results) Recent Labs    01/06/18 1944 01/16/18 2332  BNP 37.0 402.0*     CBG: Recent Labs  Lab  01/31/18 0011 01/31/18 0441 01/31/18 0814 01/31/18 1301 01/31/18 1524  GLUCAP 169* 158* 98 134* 149*     Studies: Dg Chest Port 1 View  Result Date: 01/31/2018 CLINICAL DATA:  PICC line placement. EXAM: PORTABLE CHEST 1 VIEW COMPARISON:  01/31/2018 and 01/30/2018. FINDINGS: 1225 hours. Right arm PICC extends to the level of the superior cavoatrial junction. Tracheostomy appears unchanged. There are stable diffuse bilateral pulmonary opacities. No pneumothorax or significant pleural effusion. The heart size and mediastinal contours are grossly stable, partially obscured by the airspace opacities. IMPRESSION: PICC line terminates at the level of the superior cavoatrial junction. Persistent bilateral airspace opacities. Electronically Signed   By: Richardean Sale M.D.   On: 01/31/2018 12:52   Dg Chest Port 1 View  Result Date: 01/31/2018 CLINICAL DATA:  Congestive heart failure. EXAM: PORTABLE CHEST  1 VIEW COMPARISON:  Radiograph of Jan 30, 2018. FINDINGS: Stable cardiomediastinal silhouette. Tracheostomy tube and right sided PICC line are unchanged in position. Hypoinflation of the lungs is noted. Stable bilateral lung opacities are noted concerning for edema or pneumonia. No pneumothorax or significant pleural effusion is noted. Bony thorax is unremarkable. IMPRESSION: Stable support apparatus. Hypoinflation of the lungs. Stable bilateral lung opacities concerning for edema or pneumonia. Electronically Signed   By: Marijo Conception, M.D.   On: 01/31/2018 07:13   Dg Chest Port 1 View  Result Date: 01/30/2018 CLINICAL DATA:  Congestive heart failure EXAM: PORTABLE CHEST 1 VIEW COMPARISON:  Jan 29, 2018 FINDINGS: Tracheostomy catheter tip is 5.6 cm above the carina. Central catheter tip is in the superior vena cava. No pneumothorax. There remains cardiomegaly with pulmonary venous hypertension. There is patchy interstitial and alveolar opacity, likely edema. No adenopathy. No bone lesions. IMPRESSION: Tube and catheter positions as described without pneumothorax. Pulmonary vascular congestion with interstitial and alveolar edema, stable from 1 day prior. No new opacity evident. Electronically Signed   By: Lowella Grip III M.D.   On: 01/30/2018 09:07    Scheduled Meds: . acetylcysteine  3 mL Nebulization Q8H  . amiodarone  400 mg Per Tube BID  . atorvastatin  80 mg Per Tube q1800  . budesonide (PULMICORT) nebulizer solution  0.5 mg Nebulization BID  . chlorhexidine gluconate (MEDLINE KIT)  15 mL Mouth Rinse BID  . clonazePAM  0.5 mg Per Tube BID  . digoxin  0.25 mg Per Tube Daily  . docusate  100 mg Per Tube BID  . fentaNYL  75 mcg Transdermal Q72H  . FLUoxetine  40 mg Per Tube Daily  . free water  200 mL Per Tube Q8H  . hydrochlorothiazide  25 mg Oral Daily  . insulin aspart  0-20 Units Subcutaneous Q4H  . insulin glargine  20 Units Subcutaneous QHS  . ipratropium-albuterol  3 mL  Nebulization Q8H  . lisinopril  5 mg Per Tube Daily  . mouth rinse  15 mL Mouth Rinse 10 times per day  . metoCLOPramide  10 mg Oral Q12H  . metoprolol tartrate  25 mg Per Tube Q6H  . nystatin  5 mL Oral QID  . pantoprazole sodium  40 mg Per Tube Daily  . Valproate Sodium  1,000 mg Per Tube BID   Continuous Infusions: . dexmedetomidine (PRECEDEX) IV infusion 0.4 mcg/kg/hr (01/31/18 1310)  . feeding supplement (GLUCERNA 1.5 CAL) Stopped (01/31/18 0800)  . magnesium sulfate 1 - 4 g bolus IVPB 4 g (01/31/18 1452)  . meropenem (MERREM) IV Stopped (01/31/18 1339)  . vancomycin 1,000 mg (01/31/18  1445)    Assessment/Plan:  1. Ventilator associated pneumonia started on meropenem and vancomycin.  2. Acute hypoxic respiratory failure.   worsening oxygen requirements ventilator at 100% FiO2.  Critical care team considering a bronchoscopy for this afternoon. 3. Atrial fibrillation with rapid ventricular response, episodes of SVT.  Patient on amiodarone, digoxin and metoprolol.   less episodes of SVT with the increased dose of amiodarone. 4. Acute on chronic systolic congestive heart failure with anasarca and scrotal edema.  On IV Lasix 40 mg IV twice daily. 5. Acute encephalopathy.   history of traumatic brain injury. 6. Previous septic shock and pneumonia treated already. 7. Gastrointestinal bleed. on PPI.  Endoscopy negative.   Xarelto stopped and patient was given another unit of blood.   8. Acute kidney injury resolved. 9. Type 2 diabetes mellitus on glargine insulin and sliding scale 10. Nutrition on PEG tube feeding on hold with potential bronch 11. Depression on Prozac and Depakene. 12. Thrush on nystatin swish and swallow 13. Hyperlipidemia unspecified on atorvastatin  Code Status:     Code Status Orders  (From admission, onward)        Start     Ordered   01/06/18 2227  Full code  Continuous     01/06/18 2226    Code Status History    This patient has a current code status  but no historical code status.     Family Communication:   mother and patient's sister at the bedside Disposition Plan:  will need long-term placement on a facility that can handle a ventilator  Consultants:  Critical care specialist  Cardiology  Time spent: 28 minutes  [place]

## 2018-01-31 NOTE — Progress Notes (Signed)
Pharmacy Antibiotic Note  Tommy Mathews is a 57 y.o. male being treated for possible ventilator assiociated pneumonia.  Pharmacy has been consulted for meropenem and vancomycin dosing. Patient also initiated on anidulafungin on 5/3. Patient currently requiring mechanical ventilation and has a PEG. PICC line changed on 5/3.   Patient previously treated for aspiration pneumonia this admission.   Plan: Will start vancomycin  IV Q8hr for goal trough of 15-20. Will obtain vancomycin trough prior to fifth dose (patient received vancomycin 1g).   Meropenem 1g IV Q8hr.    Height:  (177.8 cm) Weight: 293 lb 10.4 oz (133.2 kg) IBW/kg (Calculated) : 73  Adjusted Body Weight: 98kg   Temp (24hrs), Avg:101.1 F (38.4 C), Min:98.4 F (36.9 C), Max:104.5 F (40.3 C)  Recent Labs  Lab 01/26/18 0447 01/27/18 0455 01/28/18 0401 01/29/18 0448 01/29/18 1238 01/30/18 0458 01/31/18 0817 01/31/18 0818 01/31/18 1049  WBC 8.9 11.4* 11.7* 11.1*  --   --   --  12.6*  --   CREATININE 0.60* 0.79 0.76 0.69 0.70 0.71  --  0.89  --   LATICACIDVEN  --   --   --   --   --   --  2.2*  --  1.3    Estimated Creatinine Clearance: 128.8 mL/min (by C-G formula based on SCr of 0.89 mg/dL).    No Known Allergies  Antimicrobials this admission: Vancomycin 5/3 >>  Meropenem 5/3 >>  Anidulafungin 5/3 >>  Dose adjustments this admission: N/A  Microbiology results: 5/3 BCx: pending  5/3 UCx: pending  5/3 Tracheal Aspirate: pending  4/20 MRSA PCR: pending   Thank you for allowing pharmacy to be a part of this patient's care.  Hasana Alcorta L 01/31/2018 5:28 PM

## 2018-01-31 NOTE — Progress Notes (Signed)
RT called to patients bedside due to desaturations down into the 60's, RN was ventilating patient with ambu bag when RT entered room with sats then in the upper 70's. Patient was placed back on the vent, fio2 increased to 100% from 90%, and Peep was increased to 12 cm h2o from 5cm h2o. Patient was also lavaged and suctioned which yielded copious tanish yellow secretions with sporadic pink tinge mixed in. Recruitment maneuver was also done on patient. Eventually patients sats returned to 92% with the above mentioned. Left at 12 peep and 100% fio2, intensivist aware.

## 2018-01-31 NOTE — Progress Notes (Signed)
MD Samaad called to bedside to assess patient due to oxygen desaturations, elevated blood pressure and tachycardia.  Once at bedside, MD Samaad made aware of needed sedation and hyperthermia.  Will continue to monitor patient.

## 2018-01-31 NOTE — Progress Notes (Signed)
Pharmacy Electrolyte Monitoring Consult:  Pharmacy consulted to assist in monitoring and replacing electrolytes in this 57 y.o. male admitted on 01/06/2018   Patient requiring mechanical ventilation and has PEG tube.   Labs:  Sodium (mmol/L)  Date Value  01/31/2018 136   Potassium (mmol/L)  Date Value  01/31/2018 3.7   Magnesium (mg/dL)  Date Value  91/47/8295 1.4 (L)   Phosphorus (mg/dL)  Date Value  62/13/0865 3.2   Calcium (mg/dL)  Date Value  78/46/9629 8.2 (L)   Albumin (g/dL)  Date Value  52/84/1324 2.0 (L)    Plan: Patient ordered furosemide  IV Q12hr x 2 doses. Will order potassium VT x 1.   Will recheck electrolytes with am labs.   Pharmacy will continue to monitor and adjust per consult.   Simpson,Michael L 01/31/2018 8:25 PM

## 2018-01-31 NOTE — Consult Note (Signed)
[facility] Infectious Disease     Reason for Consult: fever   Referring Physician:  Dr. Kandy Garrison Date of Admission:  01/06/2018   Principal Problem:   Severe sepsis with septic shock ([place]) Active Problems:   GI bleed   Acute respiratory failure with hypoxia (HCC)   Multifocal pneumonia   UTI (urinary tract infection)   AKI (acute kidney injury) ([place])   Ileus (HCC)   Diabetes ([place])   Pressure injury of skin   HPI: Tommy Mathews is a 57 y.o. 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 He has had a prolonged admission and is now trached and has a PEG tube.  He was treated with IV abx until 4/15 then again 4/20-21. Was then off of abx until developed fever 5/2 with wbc up some to 12. CXR however did not show any new opacities but has increasing resp requirements and increased sputum. Not on presors at this time. PICC line RUE changed.  Prior sputum cx with Nml flora 4/20. Prior bcx neg. FU BCX and sputum pending.  Past Medical History:  Diagnosis Date  . Diabetes ([place])   . GERD (gastroesophageal reflux disease)   . HLD (hyperlipidemia)   . HTN (hypertension)    Past Surgical History:  Procedure Laterality Date  . PEG PLACEMENT N/A 01/21/2018   Procedure: PERCUTANEOUS ENDOSCOPIC GASTROSTOMY (PEG) PLACEMENT;  Surgeon: [name], MD;  Location: [facility] END[facility]COPY;  Service: Gastroenterology;  Laterality: N/A;  . TRACHEOSTOMY TUBE PLACEMENT N/A 01/23/2018   Procedure: TRACHEOSTOMY;  Surgeon: Carloyn Manner, MD;  Location: [facility] ORS;  Service: ENT;  Laterality: N/A;   Social History   Tobacco Use  . Smoking status: Current Every Day Smoker    Packs/day: 1.00    Years: 40.00    Pack years: 40.00    Types: Cigarettes    Start date: 2000  . Smokeless tobacco: Never Used  Substance Use Topics  . Alcohol use: Never    Frequency: Never  . Drug use: Never   History reviewed. No pertinent  family history.  Allergies: No Known Allergies  Current antibiotics: Antibiotics Given (last 72 hours)    Date/Time Action Medication Dose Rate   01/31/18 1309 New Bag/Given   meropenem (MERREM) 1 g in sodium chloride 0.9 % 100 mL IVPB 1 g 200 mL/hr   01/31/18 1445 New Bag/Given   vancomycin (VANCOCIN) IVPB 1000 mg/200 mL premix 1,000 mg 200 mL/hr      MEDICATIONS: . acetylcysteine  3 mL Nebulization Q8H  . amiodarone  400 mg Per Tube BID  . atorvastatin  80 mg Per Tube q1800  . budesonide (PULMICORT) nebulizer solution  0.5 mg Nebulization BID  . chlorhexidine gluconate (MEDLINE KIT)  15 mL Mouth Rinse BID  . clonazePAM  0.5 mg Per Tube BID  . digoxin  0.25 mg Per Tube Daily  . docusate  100 mg Per Tube BID  . fentaNYL  75 mcg Transdermal Q72H  . FLUoxetine  40 mg Per Tube Daily  . free water  200 mL Per Tube Q8H  . hydrochlorothiazide  25 mg Oral Daily  . insulin aspart  0-20 Units Subcutaneous Q4H  . insulin glargine  20 Units Subcutaneous QHS  . ipratropium-albuterol  3 mL Nebulization Q8H  . lisinopril  5 mg Per Tube Daily  . mouth rinse  15 mL Mouth Rinse 10 times per day  . metoCLOPramide  10 mg Oral  Q12H  . metoprolol tartrate  25 mg Per Tube Q6H  . nystatin  5 mL Oral QID  . pantoprazole sodium  40 mg Per Tube Daily  . Valproate Sodium  1,000 mg Per Tube BID    Review of Systems - unable to assess  OBJECTIVE: Temp:  [97.7 F (36.5 C)-104.5 F (40.3 C)] 99.4 F (37.4 C) (05/03 1436) Pulse Rate:  [68-157] 73 (05/03 1436) Resp:  [17-49] 23 (05/03 1436) BP: (101-205)/(39-111) 107/63 (05/03 1300) SpO2:  [83 %-100 %] 100 % (05/03 1436) FiO2 (%):  [50 %-100 %] 100 % (05/03 1000) Physical Exam  Constitutional: lethargic, HENT: trach in place, anicteric Mouth/Throat: Oropharynx is clear and moist.  Cardiovascular: Normal rate, regular rhythm and normal heart sounds.  Pulmonary/Chest: rhonchi bil Abdominal: Soft. Bowel sounds are normal. He exhibits no  distension. There is no tenderness.  PEG Site wnl Lymphadenopathy: He has no cervical adenopathy.  Neurological: lethargic Skin: Skin is warm and dry. No rash noted. No erythema.  Psychiatric: trached PICC RUE WNL  LABS: Results for orders placed or performed during the hospital encounter of 01/06/18 (from the past 48 hour(s))  Glucose, capillary     Status: Abnormal   Collection Time: 01/29/18  3:46 PM  Result Value Ref Range   Glucose-Capillary 111 (H) 65 - 99 mg/dL  Glucose, capillary     Status: Abnormal   Collection Time: 01/29/18  7:35 PM  Result Value Ref Range   Glucose-Capillary 162 (H) 65 - 99 mg/dL  Glucose, capillary     Status: Abnormal   Collection Time: 01/29/18 11:34 PM  Result Value Ref Range   Glucose-Capillary 132 (H) 65 - 99 mg/dL  Glucose, capillary     Status: Abnormal   Collection Time: 01/30/18  4:12 AM  Result Value Ref Range   Glucose-Capillary 116 (H) 65 - 99 mg/dL  Ammonia     Status: None   Collection Time: 01/30/18  4:57 AM  Result Value Ref Range   Ammonia 27 9 - 35 umol/L    Comment: Performed at [facility], [address] [place], [place] [address]  Comprehensive metabolic panel     Status: Abnormal   Collection Time: 01/30/18  4:58 AM  Result Value Ref Range   Sodium 138 135 - 145 mmol/L   Potassium 3.7 3.5 - 5.1 mmol/L   Chloride 92 (L) 101 - 111 mmol/L   CO2 40 (H) 22 - 32 mmol/L   Glucose, Bld 130 (H) 65 - 99 mg/dL   BUN 14 6 - 20 mg/dL   Creatinine, Ser 0.71 0.61 - 1.24 mg/dL   Calcium 8.1 (L) 8.9 - 10.3 mg/dL   Total Protein 5.8 (L) 6.5 - 8.1 g/dL   Albumin 2.0 (L) 3.5 - 5.0 g/dL   AST 33 15 - 41 U/L   ALT 21 17 - 63 U/L   Alkaline Phosphatase 68 38 - 126 U/L   Total Bilirubin 0.3 0.3 - 1.2 mg/dL   GFR calc non Af Amer >60 >60 mL/min   GFR calc Af Amer >60 >60 mL/min    Comment: (NOTE) The eGFR has been calculated using the CKD EPI equation. This calculation has not been validated in all clinical  situations. eGFR's persistently <60 mL/min signify possible Chronic Kidney Disease.    Anion gap 6 5 - 15    Comment: Performed at [facility], [address] [place], [place] [address]  Magnesium     Status: None  Collection Time: 01/30/18  4:58 AM  Result Value Ref Range   Magnesium 1.8 1.7 - 2.4 mg/dL    Comment: Performed at [facility], [address] [place], [place] [address]  Phosphorus     Status: None   Collection Time: 01/30/18  4:58 AM  Result Value Ref Range   Phosphorus 3.2 2.5 - 4.6 mg/dL    Comment: Performed at [facility], [address] [place], [place] [address]  Valproic acid level     Status: None   Collection Time: 01/30/18  4:58 AM  Result Value Ref Range   Valproic Acid Lvl 53 50.0 - 100.0 ug/mL    Comment: Performed at [facility], [address] [place], [place] [address]  Calcium, ionized     Status: None   Collection Time: 01/30/18  7:35 AM  Result Value Ref Range   Calcium, Ionized, Serum 4.8 4.5 - 5.6 mg/dL    Comment: (NOTE) Performed At: [facility] [place], [place] [id number] Rush Farmer MD [phone] Performed at [facility], [address] [place], [place] [address]   Glucose, capillary     Status: Abnormal   Collection Time: 01/30/18  7:42 AM  Result Value Ref Range   Glucose-Capillary 146 (H) 65 - 99 mg/dL  Blood gas, arterial     Status: Abnormal   Collection Time: 01/30/18  9:30 AM  Result Value Ref Range   FIO2 1.00    Delivery systems VENTILATOR    Mode PRESSURE REGULATED VOLUME CONTROL    VT 490 mL   LHR 14 resp/min   Peep/cpap 12.0 cm H20   pH, Arterial 7.47 (H) 7.350 - 7.450   pCO2 arterial 58 (H) 32.0 - 48.0 mmHg   pO2, Arterial 94 83.0 - 108.0 mmHg   Bicarbonate 42.2 (H) 20.0 - 28.0 mmol/L   Acid-Base Excess 15.6 (H) 0.0 - 2.0 mmol/L   O2 Saturation 97.8 %   Patient temperature 37.0    Collection site LEFT RADIAL    Sample type  ARTERIAL DRAW    Allens test (pass/fail) PASS PASS    Comment: Performed at [facility], [address] [place], [place] [address]  Glucose, capillary     Status: Abnormal   Collection Time: 01/30/18 11:44 AM  Result Value Ref Range   Glucose-Capillary 152 (H) 65 - 99 mg/dL  Hemoglobin and hematocrit, blood     Status: Abnormal   Collection Time: 01/30/18  2:54 PM  Result Value Ref Range   Hemoglobin 7.1 (L) 13.0 - 18.0 g/dL   HCT 22.3 (L) 40.0 - 52.0 %    Comment: Performed at [facility], [address] [place],  [address]  Glucose, capillary     Status: Abnormal   Collection Time: 01/30/18  3:50 PM  Result Value Ref Range   Glucose-Capillary 148 (H) 65 - 99 mg/dL  Type and screen [place]     Status: None   Collection Time: 01/30/18  4:40 PM  Result Value Ref Range   ABO/RH(D) O POS    Antibody Screen NEG    Sample Expiration 02/02/2018    Unit Number [id number]    Blood Component Type RCLI PHER 2    Unit division 00    Status of Unit ISSUED,FINAL    Transfusion Status OK TO TRANSFUSE    Crossmatch Result      Compatible Performed at [facility], [place],  [place] [address]   Prepare RBC     Status: None   Collection Time: 01/30/18  4:43 PM  Result Value Ref Range   Order Confirmation      ORDER PROCESSED BY BLOOD BANK Performed at [facility], [address] [place], [place] [address]   Glucose, capillary     Status: Abnormal   Collection Time: 01/30/18  7:38 PM  Result Value Ref Range   Glucose-Capillary 114 (H) 65 - 99 mg/dL   Comment 1 Notify RN    Comment 2 Document in Chart   Glucose, capillary     Status: Abnormal   Collection Time: 01/31/18 12:11 AM  Result Value Ref Range   Glucose-Capillary 169 (H) 65 - 99 mg/dL   Comment 1 Notify RN   Glucose, capillary     Status: Abnormal   Collection Time: 01/31/18  4:41 AM  Result Value Ref Range   Glucose-Capillary  158 (H) 65 - 99 mg/dL   Comment 1 Notify RN   Glucose, capillary     Status: None   Collection Time: 01/31/18  8:14 AM  Result Value Ref Range   Glucose-Capillary 98 65 - 99 mg/dL  Digoxin level     Status: None   Collection Time: 01/31/18  8:15 AM  Result Value Ref Range   Digoxin Level 0.8 0.8 - 2.0 ng/mL    Comment: Performed at [facility], [address] [place], [place] [address]  Lactic acid, plasma     Status: Abnormal   Collection Time: 01/31/18  8:17 AM  Result Value Ref Range   Lactic Acid, Venous 2.2 (HH) 0.5 - 1.9 mmol/L    Comment: CRITICAL RESULT CALLED TO, READ BACK BY AND VERIFIED WITH TAYLOR FOUSTER '@0912'  01/31/18 [facility] Performed at [facility], [address] [place], [place] [address]   Basic metabolic panel     Status: Abnormal   Collection Time: 01/31/18  8:18 AM  Result Value Ref Range   Sodium 136 135 - 145 mmol/L   Potassium 3.7 3.5 - 5.1 mmol/L   Chloride 91 (L) 101 - 111 mmol/L   CO2 38 (H) 22 - 32 mmol/L   Glucose, Bld 107 (H) 65 - 99 mg/dL   BUN 18 6 - 20 mg/dL   Creatinine, Ser 0.89 0.61 - 1.24 mg/dL   Calcium 8.2 (L) 8.9 - 10.3 mg/dL   GFR calc non Af Amer >60 >60 mL/min   GFR calc Af Amer >60 >60 mL/min    Comment: (NOTE) The eGFR has been calculated using the CKD EPI equation. This calculation has not been validated in all clinical situations. eGFR's persistently <60 mL/min signify possible Chronic Kidney Disease.    Anion gap 7 5 - 15    Comment: Performed at [facility], [address] [place], [place] [address]  Magnesium     Status: Abnormal   Collection Time: 01/31/18  8:18 AM  Result Value Ref Range   Magnesium 1.4 (L) 1.7 - 2.4 mg/dL    Comment: Performed at [facility], [address] [place], [place] [address]  Phosphorus     Status: None   Collection Time: 01/31/18  8:18 AM  Result Value Ref Range   Phosphorus 3.2 2.5 - 4.6 mg/dL    Comment: Performed at [facility] [place] [facility], [address], [place],  [address]  CBC with Differential/Platelet     Status: Abnormal   Collection Time: 01/31/18  8:18 AM  Result  Value Ref Range   WBC 12.6 (H) 3.8 - 10.6 K/uL   RBC 3.25 (L) 4.40 - 5.90 MIL/uL   Hemoglobin 9.3 (L) 13.0 - 18.0 g/dL   HCT 27.2 (L) 40.0 - 52.0 %   MCV 83.7 80.0 - 100.0 fL   MCH 28.6 26.0 - 34.0 pg   MCHC 34.2 32.0 - 36.0 g/dL   RDW 17.5 (H) 11.5 - 14.5 %   Platelets 273 150 - 440 K/uL   Neutrophils Relative % 34 %   Lymphocytes Relative 1 %   Monocytes Relative 1 %   Eosinophils Relative 0 %   Basophils Relative 0 %   Band Neutrophils 60 %   Metamyelocytes Relative 3 %   Myelocytes 1 %   Promyelocytes Relative 0 %   Blasts 0 %   nRBC 2 (H) 0 /100 WBC   Other 0 %   Neutro Abs 12.4 (H) 1.4 - 6.5 K/uL   Lymphs Abs 0.1 (L) 1.0 - 3.6 K/uL   Monocytes Absolute 0.1 (L) 0.2 - 1.0 K/uL   Eosinophils Absolute 0.0 0 - 0.7 K/uL   Basophils Absolute 0.0 0 - 0.1 K/uL   RBC Morphology MIXED RBC POPULATION     Comment: Performed at [facility], [address] [place], [place] [address]  Culture, respiratory (NON-Expectorated)     Status: None (Preliminary result)   Collection Time: 01/31/18 10:47 AM  Result Value Ref Range   Specimen Description      TRACHEAL ASPIRATE Performed at [facility], [address], [place], [place] [address]    Special Requests      NONE Performed at [facility], [address] [place], [place] [address]    Gram Stain      MODERATE WBC PRESENT, PREDOMINANTLY PMN MODERATE GRAM POSITIVE COCCI MODERATE GRAM POSITIVE RODS Performed at [facility], [address], [place], [place] [address]    Culture PENDING    Report Status PENDING   Lactic acid, plasma     Status: None   Collection Time: 01/31/18 10:49 AM  Result Value Ref Range   Lactic Acid, Venous 1.3 0.5 - 1.9 mmol/L    Comment: Performed at [facility], [address] [place], [place] [address]  Glucose,  capillary     Status: Abnormal   Collection Time: 01/31/18  1:01 PM  Result Value Ref Range   Glucose-Capillary 134 (H) 65 - 99 mg/dL   No components found for: ESR, C REACTIVE PROTEIN MICRO: Recent Results (from the past 720 hour(s))  Blood Culture (routine x 2)     Status: None   Collection Time: 01/06/18  7:44 PM  Result Value Ref Range Status   Specimen Description BLOOD BLOOD LEFT WRIST  Final   Special Requests   Final    BOTTLES DRAWN AEROBIC AND ANAEROBIC Blood Culture adequate volume   Culture   Final    NO GROWTH 5 DAYS Performed at [facility], [address], [place], [place] [address]    Report Status 01/11/2018 FINAL  Final  Urine culture     Status: Abnormal   Collection Time: 01/06/18  7:44 PM  Result Value Ref Range Status   Specimen Description   Final    URINE, RANDOM Performed at [facility], [address], [place], [place] [address]    Special Requests   Final    NONE Performed at [facility], [address], [place], [place] [address]  Culture (A)  Final    10,000 COLONIES/mL STAPHYLOCOCCUS SPECIES (COAGULASE NEGATIVE) CALL MICROBIOLOGY LAB IF SENSITIVITIES ARE REQUIRED. Performed at [facility], [place] [address], [place], [place] [address]    Report Status 01/08/2018 FINAL  Final  MRSA PCR Screening     Status: Abnormal   Collection Time: 01/06/18 11:51 PM  Result Value Ref Range Status   MRSA by PCR POSITIVE (A) NEGATIVE Final    Comment:        The GeneXpert MRSA Assay (FDA approved for NASAL specimens only), is one component of a comprehensive MRSA colonization surveillance program. It is not intended to diagnose MRSA infection nor to guide or monitor treatment for MRSA infections. RESULT CALLED TO, READ BACK BY AND VERIFIED WITH: BETH BUONO 01/07/18 @ 0121  Roe   Performed at [facility], [address] [place], [place] [address]   Blood Culture (routine x 2)     Status: None    Collection Time: 01/07/18  7:19 AM  Result Value Ref Range Status   Specimen Description BLOOD RIGHT HAND  Final   Special Requests   Final    BOTTLES DRAWN AEROBIC AND ANAEROBIC Blood Culture results may not be optimal due to an inadequate volume of blood received in culture bottles   Culture   Final    NO GROWTH 5 DAYS Performed at [facility], [address] [place], [place] [address]    Report Status 01/12/2018 FINAL  Final  Culture, respiratory (NON-Expectorated)     Status: None   Collection Time: 01/07/18 12:28 PM  Result Value Ref Range Status   Specimen Description   Final    TRACHEAL ASPIRATE Performed at [facility], [address], [place], [place] [address]    Special Requests   Final    Normal Performed at [facility], [address] [place], [place] [address]    Gram Stain   Final    MODERATE WBC PRESENT,BOTH PMN AND MONONUCLEAR RARE GRAM POSITIVE COCCI RARE YEAST    Culture   Final    Consistent with normal respiratory flora. Performed at [facility], [place] [address], [place], [place] [address]    Report Status 01/09/2018 FINAL  Final  Culture, expectorated sputum-assessment     Status: None   Collection Time: 01/18/18  5:07 PM  Result Value Ref Range Status   Specimen Description ENDOTRACHEAL  Final   Special Requests NONE  Final   Sputum evaluation   Final    THIS SPECIMEN IS ACCEPTABLE FOR SPUTUM CULTURE Performed at [facility], [address] [place], [place] [address]    Report Status 01/18/2018 FINAL  Final  Culture, respiratory (NON-Expectorated)     Status: None   Collection Time: 01/18/18  5:07 PM  Result Value Ref Range Status   Specimen Description   Final    ENDOTRACHEAL Performed at [facility], [address] [place], [place] [address]    Special Requests   Final    NONE Reflexed from [phone] [phone] Performed at [facility], [address] [place],  [address]     Gram Stain   Final    MODERATE WBC PRESENT,BOTH PMN AND MONONUCLEAR NO SQUAMOUS EPITHELIAL CELLS SEEN RARE BUDDING YEAST SEEN    Culture   Final    RARE Consistent with normal respiratory flora. Performed at [facility], [place] [address] [place],  [address]    Report Status 01/21/2018 FINAL  Final  MRSA PCR Screening     Status: Abnormal   Collection Time: 01/18/18  8:26 PM  Result Value Ref Range Status   MRSA by PCR POSITIVE (A) NEGATIVE Final    Comment:        The GeneXpert MRSA Assay (FDA approved for NASAL specimens only), is one component of a comprehensive MRSA colonization surveillance program. It is not intended to diagnose MRSA infection nor to guide or monitor treatment for MRSA infections. RESULT CALLED TO, READ BACK BY AND VERIFIED WITH: ANGELA BREHUN AT 2200 ON 01/18/18 RWW Performed at [facility], [address] [place], [place] [address]   Culture, respiratory (NON-Expectorated)     Status: None (Preliminary result)   Collection Time: 01/31/18 10:47 AM  Result Value Ref Range Status   Specimen Description   Final    TRACHEAL ASPIRATE Performed at [facility], [address] [place], [place] [address]    Special Requests   Final    NONE Performed at [facility], [address] [place], [place] [address]    Gram Stain   Final    MODERATE WBC PRESENT, PREDOMINANTLY PMN MODERATE GRAM POSITIVE COCCI MODERATE GRAM POSITIVE RODS Performed at [facility], [place] [address], [place], [place] [address]    Culture PENDING  Incomplete   Report Status PENDING  Incomplete    IMAGING: Ct Abdomen Pelvis Wo Contrast  Result Date: 01/06/2018 CLINICAL DATA:  Respiratory arrest.  Suspect pulmonary embolism. EXAM: CT CHEST, ABDOMEN AND PELVIS WITHOUT CONTRAST TECHNIQUE: Multidetector CT imaging of the chest, abdomen and pelvis was performed following the standard protocol without IV contrast. COMPARISON:  Chest  radiograph January 06, 2018 FINDINGS: CT CHEST FINDINGS CARDIOVASCULAR: Heart and pericardium are unremarkable. Thoracic aorta is normal course and caliber, mild calcific atherosclerosis. MEDIASTINUM/NODES: No mediastinal mass. No lymphadenopathy by CT size criteria limited assessment without contrast. Normal appearance of thoracic esophagus though not tailored for evaluation. LUNGS/PLEURA: Tracheobronchial tree is patent, no pneumothorax. Endotracheal tube tip above the carina. Bilateral tree-in-bud infiltrates and dense consolidation with air bronchograms. RIGHT upper lobe collapse with soft tissue effacing RIGHT upper lobe bronchus. MUSCULOSKELETAL: Included soft tissues and included osseous structures appear normal. CT ABDOMEN PELVIS FINDINGS HEPATOBILIARY: Subcentimeter layering gallstones without CT findings of acute cholecystitis by noncontrast CT. Trace perihepatic focal fat. PANCREAS: Normal. SPLEEN: Normal. ADRENALS/URINARY TRACT: Kidneys are orthotopic, demonstrating normal size and morphology. No nephrolithiasis, hydronephrosis; limited assessment for renal masses on this nonenhanced examination. The unopacified ureters are normal in course and caliber. Urinary bladder decompressed by Foley catheter. Normal adrenal glands. STOMACH/BOWEL: Nasogastric tube terminates in distal stomach. Gas distended small bowel measuring to 3.8 cm with multiple transition points in air-fluid levels. Large bowel normal in course and caliber. Normal appendix. VASCULAR/LYMPHATIC: Mildly ectatic 2.4 cm infrarenal aorta. Mild calcific atherosclerosis. No lymphadenopathy by CT size criteria. REPRODUCTIVE: Normal. OTHER: No intraperitoneal free fluid or free air. MUSCULOSKELETAL: Non-acute. Small fat containing LEFT inguinal hernia. Mild L5-S1 degenerative disc. IMPRESSION: CT CHEST: 1. Pulmonary embolism cannot be excluded by noncontrast CT. 2. Multifocal severe pneumonia with RIGHT upper lobe collapse and soft tissue obstructing  RIGHT upper lobe bronchus. Consider bronchoscopy. 3. Endotracheal tube tip above the carina. CT ABDOMEN AND PELVIS: 1. Mild small bowel ileus, potential enteritis. Nasogastric tube terminates in distal stomach. 2. Cholelithiasis without CT findings of acute cholecystitis. Aortic Atherosclerosis (ICD10-I70.0). Electronically Signed   By: Elon Alas M.D.   On: 01/06/2018 21:44   Dg Abd 1  View  Result Date: 01/27/2018 CLINICAL DATA:  Ileus EXAM: ABDOMEN - 1 VIEW COMPARISON:  01/24/2018 FINDINGS: Diffuse gaseous distention of bowel again noted compatible with ileus, unchanged. No free air or organomegaly. No acute bony abnormality. IMPRESSION: Stable ileus pattern. Electronically Signed   By: Rolm Baptise M.D.   On: 01/27/2018 07:51   Dg Abd 1 View  Result Date: 01/24/2018 CLINICAL DATA:  Constipation EXAM: ABDOMEN - 1 VIEW COMPARISON:  01/13/2018 FINDINGS: A gastrostomy tube is demonstrated over the stomach. There is mild gaseous distention of the stomach, colon, and small bowel. Prominent stool in the rectum. Changes may be due to adynamic ileus or pseudo-obstruction from impacted rectal stool. No radiopaque stones. Infiltration or atelectasis in the lung bases. IMPRESSION: Mildly gas distended stomach, small bowel, and colon with prominent stool in the rectum suggesting adynamic ileus or pseudo-obstruction due to stool impaction. Electronically Signed   By: Lucienne Capers M.D.   On: 01/24/2018 23:47   Dg Abd 1 View  Result Date: 01/13/2018 CLINICAL DATA:  Orogastric tube placement EXAM: ABDOMEN - 1 VIEW COMPARISON:  CT 01/06/2018 FINDINGS: Consolidation at the left lung base. Esophageal tube tip projects over the proximal stomach. Air-filled dilated bowel in the upper abdomen. IMPRESSION: Esophageal tube tip overlies the proximal stomach. There is consolidation at the left lung base. Electronically Signed   By: Donavan Foil M.D.   On: 01/13/2018 21:32   Ct Chest Wo Contrast  Result Date:  01/06/2018 CLINICAL DATA:  Respiratory arrest.  Suspect pulmonary embolism. EXAM: CT CHEST, ABDOMEN AND PELVIS WITHOUT CONTRAST TECHNIQUE: Multidetector CT imaging of the chest, abdomen and pelvis was performed following the standard protocol without IV contrast. COMPARISON:  Chest radiograph January 06, 2018 FINDINGS: CT CHEST FINDINGS CARDIOVASCULAR: Heart and pericardium are unremarkable. Thoracic aorta is normal course and caliber, mild calcific atherosclerosis. MEDIASTINUM/NODES: No mediastinal mass. No lymphadenopathy by CT size criteria limited assessment without contrast. Normal appearance of thoracic esophagus though not tailored for evaluation. LUNGS/PLEURA: Tracheobronchial tree is patent, no pneumothorax. Endotracheal tube tip above the carina. Bilateral tree-in-bud infiltrates and dense consolidation with air bronchograms. RIGHT upper lobe collapse with soft tissue effacing RIGHT upper lobe bronchus. MUSCULOSKELETAL: Included soft tissues and included osseous structures appear normal. CT ABDOMEN PELVIS FINDINGS HEPATOBILIARY: Subcentimeter layering gallstones without CT findings of acute cholecystitis by noncontrast CT. Trace perihepatic focal fat. PANCREAS: Normal. SPLEEN: Normal. ADRENALS/URINARY TRACT: Kidneys are orthotopic, demonstrating normal size and morphology. No nephrolithiasis, hydronephrosis; limited assessment for renal masses on this nonenhanced examination. The unopacified ureters are normal in course and caliber. Urinary bladder decompressed by Foley catheter. Normal adrenal glands. STOMACH/BOWEL: Nasogastric tube terminates in distal stomach. Gas distended small bowel measuring to 3.8 cm with multiple transition points in air-fluid levels. Large bowel normal in course and caliber. Normal appendix. VASCULAR/LYMPHATIC: Mildly ectatic 2.4 cm infrarenal aorta. Mild calcific atherosclerosis. No lymphadenopathy by CT size criteria. REPRODUCTIVE: Normal. OTHER: No intraperitoneal free fluid or  free air. MUSCULOSKELETAL: Non-acute. Small fat containing LEFT inguinal hernia. Mild L5-S1 degenerative disc. IMPRESSION: CT CHEST: 1. Pulmonary embolism cannot be excluded by noncontrast CT. 2. Multifocal severe pneumonia with RIGHT upper lobe collapse and soft tissue obstructing RIGHT upper lobe bronchus. Consider bronchoscopy. 3. Endotracheal tube tip above the carina. CT ABDOMEN AND PELVIS: 1. Mild small bowel ileus, potential enteritis. Nasogastric tube terminates in distal stomach. 2. Cholelithiasis without CT findings of acute cholecystitis. Aortic Atherosclerosis (ICD10-I70.0). Electronically Signed   By: Elon Alas M.D.   On: 01/06/2018 21:44  Dg Chest Port 1 View  Result Date: 01/31/2018 CLINICAL DATA:  PICC line placement. EXAM: PORTABLE CHEST 1 VIEW COMPARISON:  01/31/2018 and 01/30/2018. FINDINGS: 1225 hours. Right arm PICC extends to the level of the superior cavoatrial junction. Tracheostomy appears unchanged. There are stable diffuse bilateral pulmonary opacities. No pneumothorax or significant pleural effusion. The heart size and mediastinal contours are grossly stable, partially obscured by the airspace opacities. IMPRESSION: PICC line terminates at the level of the superior cavoatrial junction. Persistent bilateral airspace opacities. Electronically Signed   By: Richardean Sale M.D.   On: 01/31/2018 12:52   Dg Chest Port 1 View  Result Date: 01/31/2018 CLINICAL DATA:  Congestive heart failure. EXAM: PORTABLE CHEST 1 VIEW COMPARISON:  Radiograph of Jan 30, 2018. FINDINGS: Stable cardiomediastinal silhouette. Tracheostomy tube and right sided PICC line are unchanged in position. Hypoinflation of the lungs is noted. Stable bilateral lung opacities are noted concerning for edema or pneumonia. No pneumothorax or significant pleural effusion is noted. Bony thorax is unremarkable. IMPRESSION: Stable support apparatus. Hypoinflation of the lungs. Stable bilateral lung opacities concerning  for edema or pneumonia. Electronically Signed   By: Marijo Conception, M.D.   On: 01/31/2018 07:13   Dg Chest Port 1 View  Result Date: 01/30/2018 CLINICAL DATA:  Congestive heart failure EXAM: PORTABLE CHEST 1 VIEW COMPARISON:  Jan 29, 2018 FINDINGS: Tracheostomy catheter tip is 5.6 cm above the carina. Central catheter tip is in the superior vena cava. No pneumothorax. There remains cardiomegaly with pulmonary venous hypertension. There is patchy interstitial and alveolar opacity, likely edema. No adenopathy. No bone lesions. IMPRESSION: Tube and catheter positions as described without pneumothorax. Pulmonary vascular congestion with interstitial and alveolar edema, stable from 1 day prior. No new opacity evident. Electronically Signed   By: Lowella Grip III M.D.   On: 01/30/2018 09:07   Dg Chest Port 1 View  Result Date: 01/29/2018 CLINICAL DATA:  Shortness of breath EXAM: PORTABLE CHEST 1 VIEW COMPARISON:  January 28, 2018 FINDINGS: Tracheostomy catheter tip is 5.0 cm above the carina. Central catheter tip is in the superior vena cava. No pneumothorax. There is diffuse airspace opacity throughout the lungs bilaterally, similar to 1 day prior. There are small pleural effusions bilaterally. No new opacity evident. Heart is enlarged with pulmonary venous hypertension. No adenopathy appreciable. No bone lesions. IMPRESSION: Tube and catheter positions as described without pneumothorax. Cardiomegaly with pulmonary venous hypertension, consistent with pulmonary vascular congestion. Small pleural effusions bilaterally. Widespread airspace opacity bilaterally may represent alveolar edema or pneumonia. Both entities may exist concurrently. A degree of underlying ARDS is also possible. The overall appearance is essentially stable compared to 1 day prior. Electronically Signed   By: Lowella Grip III M.D.   On: 01/29/2018 07:45   Dg Chest Port 1 View  Result Date: 01/28/2018 CLINICAL DATA:  Pneumonia EXAM:  PORTABLE CHEST 1 VIEW COMPARISON:  01/23/2018 FINDINGS: Tracheostomy and right PICC line remain in place, unchanged. Cardiomegaly. Vascular congestion and diffuse bilateral airspace opacities are again noted, unchanged. Low lung volumes. No visible effusions or acute bony abnormality. IMPRESSION: Cardiomegaly with vascular congestion and diffuse bilateral airspace disease which could reflect edema or infection. No change. Electronically Signed   By: Rolm Baptise M.D.   On: 01/28/2018 09:21   Dg Chest Port 1 View  Result Date: 01/23/2018 CLINICAL DATA:  Tracheostomy placement EXAM: PORTABLE CHEST 1 VIEW COMPARISON:  January 21, 2018 FINDINGS: Tracheostomy catheter tip is 4.1 cm above the carina. Central catheter  tip is at the cavoatrial junction. No pneumothorax. There is persistent moderate interstitial and patchy alveolar edema with small pleural effusions bilaterally. Heart is mildly enlarged with pulmonary venous hypertension. No adenopathy. No bone lesions. IMPRESSION: Tube and catheter positions as described without pneumothorax. Evidence of a degree of congestive heart failure, similar to most recent prior study. Electronically Signed   By: Lowella Grip III M.D.   On: 01/23/2018 12:06   Dg Chest Port 1 View  Result Date: 01/21/2018 CLINICAL DATA:  Hypoxia EXAM: PORTABLE CHEST 1 VIEW COMPARISON:  January 19, 2018 FINDINGS: Endotracheal tube tip is 3.7 cm above the carina. Nasogastric tube tip and side port are below the diaphragm. Central catheter tip is in the superior vena cava. No pneumothorax. There is moderate interstitial and patchy alveolar opacities, slightly increased. There is a small left pleural effusion. Heart is upper normal in size with pulmonary venous hypertension. No adenopathy. No bone lesions. IMPRESSION: Tube and catheter positions as described without pneumothorax. Increase interstitial and patchy alveolar pulmonary edema, likely indicative of a degree of congestive heart failure.  Small left pleural effusion. Underlying pulmonary vascular congestion. Electronically Signed   By: Lowella Grip III M.D.   On: 01/21/2018 07:04   Dg Chest Port 1 View  Result Date: 01/19/2018 CLINICAL DATA:  Acute respiratory failure. EXAM: PORTABLE CHEST 1 VIEW COMPARISON:  01/18/2018 FINDINGS: Endotracheal tube terminates 4 cm above the carina. Enteric tube can be followed to the GE junction but is not well visualized beyond this due to under penetration in the upper abdomen. Right PICC terminates at the cavoatrial junction. The cardiomediastinal silhouette is unchanged allowing for differences in patient rotation. Interstitial and patchy airspace opacities are again seen bilaterally with mild improvement in the right base and without significant interval change on the left. No large pleural effusion or pneumothorax is identified. IMPRESSION: Bilateral interstitial and airspace opacities, slightly improved on the right and likely reflecting pneumonia. Electronically Signed   By: Logan Bores M.D.   On: 01/19/2018 07:49   Dg Chest Port 1 View  Result Date: 01/18/2018 CLINICAL DATA:  Oxygen desaturation EXAM: PORTABLE CHEST 1 VIEW COMPARISON:  01/18/2018 FINDINGS: Endotracheal tube, right PICC line and NG tube remain in place, unchanged. Bilateral interstitial and alveolar opacities are again noted, unchanged. Small bilateral effusions suspected. Heart is upper limits normal in size. IMPRESSION: Bilateral interstitial and airspace opacities with small effusions. No real change. Electronically Signed   By: Rolm Baptise M.D.   On: 01/18/2018 16:39   Dg Chest Port 1 View  Result Date: 01/18/2018 CLINICAL DATA:  Acute respiratory failure. EXAM: PORTABLE CHEST 1 VIEW COMPARISON:  01/16/2018 and CT chest 01/06/2018. FINDINGS: Endotracheal tube terminates 4.9 cm above the carina. Nasogastric tube is followed into the stomach. Right PICC tip projects over the SVC RA junction. Heart size within normal  limits. Lungs are somewhat low in volume with diffuse mixed interstitial and airspace opacification, possibly mildly worsened from 01/16/2018. There may be right middle lobe and left lower lobe consolidation. No definite pleural fluid. IMPRESSION: Mixed interstitial and airspace opacification, possibly slightly worsened from 01/16/2018, with possible consolidation in the right middle and left lower lobes. Findings are indicative of persistent pneumonia, especially when compared with 01/06/2018. Electronically Signed   By: Lorin Picket M.D.   On: 01/18/2018 08:12   Dg Chest Port 1 View  Result Date: 01/16/2018 CLINICAL DATA:  Hypoxia and acute respiratory failure. EXAM: PORTABLE CHEST 1 VIEW COMPARISON:  None. FINDINGS: Cardiomediastinal silhouette  is unchanged. An endotracheal tube with tip 3.2 cm above the carina, NG tube entering the stomach, RIGHT PICC line with tip overlying the SUPERIOR cavoatrial junction are again noted. Interstitial/airspace opacities are again noted with slight improved aeration on the LEFT. Slightly increased RIGHT UPPER lobe atelectasis noted. There is no evidence of pneumothorax. IMPRESSION: Slightly increased RIGHT UPPER lobe atelectasis and slightly improved LEFT lung aeration. Otherwise no significant change. Electronically Signed   By: Margarette Canada M.D.   On: 01/16/2018 20:49   Dg Chest Port 1 View  Result Date: 01/13/2018 CLINICAL DATA:  Respiratory failure. EXAM: PORTABLE CHEST 1 VIEW COMPARISON:  One-view chest x-ray 01/12/2018. FINDINGS: Heart size is exaggerate by low lung volumes. Endotracheal tube is stable. Left greater than right interstitial and airspace disease is similar the prior exam. Overall lung volumes are slightly improved. IMPRESSION: 1. Stable appearance of left greater than right interstitial and airspace disease, likely a combination of edema and possible infection. 2. Support apparatus is stable. 3. Slight improvement in lung volumes. Electronically  Signed   By: San Morelle M.D.   On: 01/13/2018 07:49   Dg Chest Port 1 View  Result Date: 01/12/2018 CLINICAL DATA:  Acute respiratory failure with hypoxia. Severe sepsis with septic shock. On ventilator. EXAM: PORTABLE CHEST 1 VIEW COMPARISON:  01/11/2018 FINDINGS: Endotracheal tube and nasogastric tube remain in appropriate position. Heart size remains within normal limits. Low lung volumes again seen with diffuse interstitial infiltrates. No evidence of focal consolidation or significant pleural effusion. IMPRESSION: Stable low lung volumes with diffuse interstitial infiltrates/edema. Electronically Signed   By: Earle Gell M.D.   On: 01/12/2018 07:37   Dg Chest Port 1 View  Result Date: 01/11/2018 CLINICAL DATA:  Respiratory failure EXAM: PORTABLE CHEST 1 VIEW COMPARISON:  January 10, 2018 FINDINGS: The ETT terminates 2.8 cm above the carina. No pneumothorax. The cardiomediastinal silhouette is stable. Pulmonary edema has decreased but persists. No other changes. IMPRESSION: 1. Stable support apparatus. 2. Persistent but decreased pulmonary edema. 3. No other change. Electronically Signed   By: Dorise Bullion III M.D   On: 01/11/2018 07:01   Dg Chest Port 1 View  Result Date: 01/10/2018 CLINICAL DATA:  Respiratory failure EXAM: PORTABLE CHEST 1 VIEW COMPARISON:  01/08/2018 FINDINGS: Cardiac shadow is stable. Endotracheal tube and nasogastric catheter are again noted in satisfactory position. Bilateral infiltrates are again identified and stable. No new focal abnormality is seen. No bony abnormality is noted. IMPRESSION: Stable bilateral infiltrates. Electronically Signed   By: Inez Catalina M.D.   On: 01/10/2018 07:24   Dg Chest Port 1 View  Result Date: 01/08/2018 CLINICAL DATA:  Respiratory failure EXAM: PORTABLE CHEST 1 VIEW COMPARISON:  Portable exam 1003 hours compared to 01/07/2018 FINDINGS: Tip of endotracheal tube projects 4.4 cm above carina. Nasogastric tube extends into abdomen.  Enlargement of cardiac silhouette. Diffuse BILATERAL airspace infiltrates increased in upper lobes since previous exam. No gross pleural effusion or pneumothorax. IMPRESSION: Increased BILATERAL airspace infiltrates. Electronically Signed   By: Lavonia Dana M.D.   On: 01/08/2018 10:33   Dg Chest Port 1 View  Result Date: 01/07/2018 CLINICAL DATA:  Acute respiratory failure.  Hypoxia. EXAM: PORTABLE CHEST 1 VIEW COMPARISON:  CT 6 hours prior, radiographs 8 hours prior. FINDINGS: Endotracheal tube 3.9 cm from the carina. Enteric tube in place with tip below the diaphragm, not included in the field of view. Progressive bilateral lung opacities, greatest in the lung bases with air bronchograms. Heart appears normal in  size. No large pleural effusion. No pneumothorax. IMPRESSION: Progressive bilateral lung opacities, greatest in the lung bases, may reflect pneumonia or aspiration. Pulmonary edema is felt less likely. Electronically Signed   By: Jeb Levering M.D.   On: 01/07/2018 04:01   Dg Chest Portable 1 View  Result Date: 01/06/2018 CLINICAL DATA:  ETT placement EXAM: PORTABLE CHEST 1 VIEW COMPARISON:  None. FINDINGS: Endotracheal tube tip is about 2.4 cm superior to the carina. Low lung volumes. No pleural effusion or pneumothorax. Heart size within normal limits. Streaky left greater than right hilar opacity. IMPRESSION: 1. Endotracheal tube tip about 2.4 cm superior to the carina 2. Low lung volumes. Streaky left greater than right perihilar opacity of uncertain chronicity. Electronically Signed   By: Donavan Foil M.D.   On: 01/06/2018 19:35   [place] Ekg Site Rite  Result Date: 01/13/2018 If Site Rite image not attached, placement could not be confirmed due to current cardiac rhythm.   Assessment:   Tommy Mathews is a 57 y.o. 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 He has had a prolonged admission and is  now trached.  He was treated with IV abx until 4/15 then again 4/20-21. Was then off of abx until developed fever 5/2 with wbc up some to 12. CXR however did not show any new opacities but has increasing resp requirements and increased sputum. Not on presors at this time. PICC line RUE changed.  Prior sputum cx with Nml flora 4/20. Prior bcx neg. FU BCX and sputum pending.  Recommendations Would start antifungal therapy with eraxis.  Cont vanco and mero pending trach culture results. PICC line has been changed. Can adjust abx based on cultures Thank you very much for allowing me to participate in the care of this patient. Please call with questions.   Cheral Marker. Ola Spurr, MD

## 2018-01-31 NOTE — Progress Notes (Addendum)
Name: Tommy Mathews MRN: [id number] DOB: 1960-10-20     CONSULTATION DATE: 01/06/2018  Subjective & Objectives: Spiked fever, increased FIO2 to 100%, a fib with RVR and restlessness  PAST MEDICAL HISTORY :   has a past medical history of Diabetes (HCC), GERD (gastroesophageal reflux disease), HLD (hyperlipidemia), and HTN (hypertension).  has a past surgical history that includes PEG placement (N/A, 01/21/2018) and Tracheostomy tube placement (N/A, 01/23/2018). Prior to Admission medications   Medication Sig Start Date End Date Taking? Authorizing Provider  atorvastatin (LIPITOR) 80 MG tablet Take 80 mg by mouth at bedtime.   Yes [provider]  divalproex (DEPAKOTE ER) 500 MG 24 hr tablet Take 500 mg by mouth 2 (two) times daily.   Yes [provider]  estradiol (ESTRACE) 2 MG tablet Take 2 mg by mouth daily.   Yes [provider]  FLUoxetine (PROZAC) 40 MG capsule Take 40 mg by mouth daily.   Yes [provider]  folic acid (FOLVITE) 800 MCG tablet Take 800 mcg by mouth daily.   Yes [provider]  hydrochlorothiazide (HYDRODIURIL) 50 MG tablet Take 50 mg by mouth daily.   Yes [provider]  lisinopril (PRINIVIL,ZESTRIL) 5 MG tablet Take 5 mg by mouth daily.   Yes [provider]  metFORMIN (GLUCOPHAGE) 1000 MG tablet Take 1,000 mg by mouth 2 (two) times daily with a meal.   Yes [provider]  methotrexate (RHEUMATREX) 2.5 MG tablet Take 15 mg by mouth once a week.   Yes [provider]  metoprolol tartrate (LOPRESSOR) 50 MG tablet Take 50 mg by mouth 2 (two) times daily.   Yes [provider]  Omega-3 Fatty Acids (FISH OIL) 1000 MG CAPS Take 1,000 mg by mouth daily.   Yes [provider]  QUEtiapine Fumarate (SEROQUEL PO) Take 500 mg by mouth 2 (two) times daily.   Yes [provider]  ranitidine (ZANTAC) 150 MG tablet Take 150 mg by mouth daily.   Yes [provider]   rivaroxaban (XARELTO) 10 MG TABS tablet Take 10 mg by mouth daily.   Yes [provider]  senna-docusate (SENOKOT-S) 8.6-50 MG tablet Take 2 tablets by mouth at bedtime.   Yes [provider]  sodium chloride 1 g tablet Take 1 g by mouth daily.   Yes [provider]   No Known Allergies  FAMILY HISTORY:  family history is not on file. SOCIAL HISTORY:  reports that he has been smoking cigarettes.  He started smoking about 19 years ago. He has a 40.00 pack-year smoking history. He has never used smokeless tobacco. He reports that he does not drink alcohol or use drugs.  REVIEW OF SYSTEMS:   Unable to obtain due to critical illness   VITAL SIGNS: Temp:  [97.7 F (36.5 C)-104.5 F (40.3 C)] 99.4 F (37.4 C) (05/03 1436) Pulse Rate:  [68-157] 73 (05/03 1436) Resp:  [17-49] 23 (05/03 1436) BP: (101-205)/(39-111) 107/63 (05/03 1300) SpO2:  [83 %-100 %] 100 % (05/03 1436) FiO2 (%):  [50 %-100 %] 100 % (05/03 1000)  Physical Examination:  restless, moving extremities and following commands Trach in place on vent and no distress, BEAE and diffuse medium crackleswith small thick grey secretions S1 & S2 audible and no murmur Benignabdomen with normalperistalses Anasarca and scrotal edema (improved)  ASSESSMENT / PLAN: Acute on chronic respiratory failure with vent dependency with picture of ARDS 100% and PEEP of 12. -Monitor ABG, optimize vent settings, monitor P/F  and PIP  A fib with RVR, CHF with dilated cardiomyopathy with LV EF 20%. 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 Subtherapeutic Valproic acid(improved) -Optimize meds and monitor level.  Pneumonia (HCAPl; worsening infiltrates + increased FIO2 requirement and secretions).worseningbil. Airspace disease with pulmonary edema -Merop + vanc. Follow with ID  recommendation for antimicrobials. -Monitor CXR + CBC + FIO2 and cultrues. -Acetylcyst + Bronchodilators Neb because of thick respiratory secretions. -Diuresis to improve lung compliance.  Sepsis  -Merop + vanc +  Eraxes as per ID -Follow with culture   HTN -Optimize antihypertensives and monitor hemodynamics  DM -Glycemic control  AKIand mild prerenal azotemia. -Monitor renal panle  Fecal impaction and constipation (resolved). Ileus on KUB -Senokot-S -Reglan  Anemiawith HB drop 1.2 gm within the last 72 h on Xeralto. H/o GI bleeding -Keep HB > 7 gm/dl. -Hold Xeralto -Follow with stool for occult blood  Dysphagia -PEG for feeding  Hypomagnesemia -Replete and monitor electrolytes.  Full code  DVT & GI Prophylaxis. Continue with supportive care  Critical care time 45 min

## 2018-01-31 NOTE — Progress Notes (Signed)
Notified MD Samaad that all three lumens of patients PICC line were now occluded and medications are not able to be given. Also made MD aware of patients increase in agitation and increase in temperature despite previous efforts. MD stated to have IV team, which had already been reconsulted to replace PICC line.

## 2018-02-01 ENCOUNTER — Inpatient Hospital Stay: Payer: Medicaid Other

## 2018-02-01 LAB — CBC WITH DIFFERENTIAL/PLATELET
BASOS ABS: 0 10*3/uL (ref 0–0.1)
BASOS PCT: 0 %
EOS ABS: 0.1 10*3/uL (ref 0–0.7)
EOS PCT: 1 %
HEMATOCRIT: 23 % — AB (ref 40.0–52.0)
Hemoglobin: 7.5 g/dL — ABNORMAL LOW (ref 13.0–18.0)
LYMPHS PCT: 7 %
Lymphs Abs: 1.2 10*3/uL (ref 1.0–3.6)
MCH: 27.8 pg (ref 26.0–34.0)
MCHC: 32.8 g/dL (ref 32.0–36.0)
MCV: 84.7 fL (ref 80.0–100.0)
MONO ABS: 2.4 10*3/uL — AB (ref 0.2–1.0)
Monocytes Relative: 14 %
Neutro Abs: 13.7 10*3/uL — ABNORMAL HIGH (ref 1.4–6.5)
Neutrophils Relative %: 78 %
PLATELETS: 236 10*3/uL (ref 150–440)
RBC: 2.71 MIL/uL — AB (ref 4.40–5.90)
RDW: 18.3 % — AB (ref 11.5–14.5)
WBC: 17.5 10*3/uL — AB (ref 3.8–10.6)

## 2018-02-01 LAB — BLOOD GAS, ARTERIAL
ACID-BASE EXCESS: 23.5 mmol/L — AB (ref 0.0–2.0)
BICARBONATE: 49.9 mmol/L — AB (ref 20.0–28.0)
FIO2: 70
MECHVT: 500 mL
Mechanical Rate: 14
O2 SAT: 93.1 %
PCO2 ART: 64 mmHg — AB (ref 32.0–48.0)
PEEP: 12 cmH2O
PH ART: 7.5 — AB (ref 7.350–7.450)
Patient temperature: 37
pO2, Arterial: 61 mmHg — ABNORMAL LOW (ref 83.0–108.0)

## 2018-02-01 LAB — GLUCOSE, CAPILLARY
GLUCOSE-CAPILLARY: 80 mg/dL (ref 65–99)
Glucose-Capillary: 72 mg/dL (ref 65–99)
Glucose-Capillary: 92 mg/dL (ref 65–99)
Glucose-Capillary: 82 mg/dL (ref 65–99)
Glucose-Capillary: 89 mg/dL (ref 65–99)
Glucose-Capillary: 91 mg/dL (ref 65–99)

## 2018-02-01 LAB — URINE CULTURE: CULTURE: NO GROWTH

## 2018-02-01 LAB — COMPREHENSIVE METABOLIC PANEL
ALBUMIN: 1.8 g/dL — AB (ref 3.5–5.0)
ALT: 23 U/L (ref 17–63)
AST: 34 U/L (ref 15–41)
Alkaline Phosphatase: 81 U/L (ref 38–126)
Anion gap: 5 (ref 5–15)
BUN: 22 mg/dL — AB (ref 6–20)
CHLORIDE: 92 mmol/L — AB (ref 101–111)
CO2: 42 mmol/L — ABNORMAL HIGH (ref 22–32)
Calcium: 7.8 mg/dL — ABNORMAL LOW (ref 8.9–10.3)
Creatinine, Ser: 0.81 mg/dL (ref 0.61–1.24)
GFR calc Af Amer: >60 mL/min (ref >60)
GFR calc non Af Amer: >60 mL/min (ref >60)
GLUCOSE: 86 mg/dL (ref 65–99)
POTASSIUM: 3.1 mmol/L — AB (ref 3.5–5.1)
Sodium: 139 mmol/L (ref 135–145)
Total Bilirubin: 0.8 mg/dL (ref 0.3–1.2)
Total Protein: 5.6 g/dL — ABNORMAL LOW (ref 6.5–8.1)

## 2018-02-01 LAB — PHOSPHORUS: Phosphorus: 4.2 mg/dL (ref 2.5–4.6)

## 2018-02-01 LAB — POTASSIUM: Potassium: 3.2 mmol/L — ABNORMAL LOW (ref 3.5–5.1)

## 2018-02-01 LAB — PREPARE RBC (CROSSMATCH)

## 2018-02-01 LAB — MAGNESIUM: Magnesium: 2.3 mg/dL (ref 1.7–2.4)

## 2018-02-01 LAB — VANCOMYCIN, TROUGH: VANCOMYCIN TR: 13 ug/mL — AB (ref 15–20)

## 2018-02-01 MED ORDER — POTASSIUM CHLORIDE 20 MEQ PO PACK
40.0000 meq | PACK | ORAL | Status: AC
  Administered 2018-02-01 – 2018-02-02 (×2): 40 meq via ORAL
  Filled 2018-02-01 (×2): qty 2

## 2018-02-01 MED ORDER — POTASSIUM CHLORIDE 20 MEQ PO PACK: 40.0000 meq | PACK | Freq: Once | ORAL | Status: DC

## 2018-02-01 MED ORDER — SODIUM CHLORIDE 0.9 % IV SOLN: Freq: Once | INTRAVENOUS | Status: DC

## 2018-02-01 MED ORDER — POTASSIUM CHLORIDE 20 MEQ PO PACK
20.0000 meq | PACK | ORAL | Status: AC
  Administered 2018-02-01 (×2): 20 meq via ORAL
  Filled 2018-02-01 (×2): qty 1

## 2018-02-01 MED ORDER — SODIUM CHLORIDE 0.9 % IV SOLN
1500.0000 mg | Freq: Three times a day (TID) | INTRAVENOUS | Status: DC
  Administered 2018-02-01 – 2018-02-02 (×4): 1500 mg via INTRAVENOUS
  Filled 2018-02-01 (×6): qty 1500

## 2018-02-01 MED ORDER — FUROSEMIDE 10 MG/ML IJ SOLN
20.0000 mg | Freq: Every day | INTRAMUSCULAR | Status: DC
  Administered 2018-02-01 – 2018-02-02 (×2): 20 mg via INTRAVENOUS
  Filled 2018-02-01 (×2): qty 2

## 2018-02-01 NOTE — Progress Notes (Signed)
Pharmacy Antibiotic Note  Tommy Mathews is a 57 y.o. male being treated for possible ventilator assiociated pneumonia.  Pharmacy has been consulted for meropenem and vancomycin dosing. Patient also initiated on anidulafungin on 5/3. Patient currently requiring mechanical ventilation and has a PEG. PICC line changed on 5/3.   Patient previously treated for aspiration pneumonia this admission.   Plan: Will start vancomycin  IV Q8hr for goal trough of 15-20. Will obtain vancomycin trough prior to fifth dose (patient received vancomycin 1g).   Meropenem 1g IV Q8hr.    Height:  (177.8 cm) Weight: 293 lb 10.4 oz (133.2 kg) IBW/kg (Calculated) : 73  Adjusted Body Weight: 98kg   Temp (24hrs), Avg:98.6 F (37 C), Min:97.9 F (36.6 C), Max:99.4 F (37.4 C)  Recent Labs  Lab 01/27/18 0455 01/28/18 0401 01/29/18 0448 01/29/18 1238 01/30/18 0458 01/31/18 0817 01/31/18 0818 01/31/18 1049 02/01/18 0411  WBC 11.4* 11.7* 11.1*  --   --   --  12.6*  --  17.5*  CREATININE 0.79 0.76 0.69 0.70 0.71  --  0.89  --  0.81  LATICACIDVEN  --   --   --   --   --  2.2*  --  1.3  --     Estimated Creatinine Clearance: 141.5 mL/min (by C-G formula based on SCr of 0.81 mg/dL).    No Known Allergies  Antimicrobials this admission: Vancomycin 5/3 >>  Meropenem 5/3 >>  Anidulafungin 5/3 >>  Dose adjustments this admission: N/A  Microbiology results: 5/3 BCx: NGTD 5/3 UCx: NG  5/3 Tracheal Aspirate: mod GPC, GPR  4/20 MRSA PCR: positive  Thank you for allowing pharmacy to be a part of this patient's care.  Tommy Mathews A 02/01/2018 1:49 PM

## 2018-02-01 NOTE — Progress Notes (Signed)
Name: Jeramey Lanuza MRN: [id number] DOB: 08-17-1961     CONSULTATION DATE: 01/06/2018  Subjective & Objective: Remains on vent, Precedex and afebrile  PAST MEDICAL HISTORY :   has a past medical history of Diabetes (HCC), GERD (gastroesophageal reflux disease), HLD (hyperlipidemia), and HTN (hypertension).  has a past surgical history that includes PEG placement (N/A, 01/21/2018) and Tracheostomy tube placement (N/A, 01/23/2018). Prior to Admission medications   Medication Sig Start Date End Date Taking? Authorizing Provider  atorvastatin (LIPITOR) 80 MG tablet Take 80 mg by mouth at bedtime.   Yes [provider]  divalproex (DEPAKOTE ER) 500 MG 24 hr tablet Take 500 mg by mouth 2 (two) times daily.   Yes [provider]  estradiol (ESTRACE) 2 MG tablet Take 2 mg by mouth daily.   Yes [provider]  FLUoxetine (PROZAC) 40 MG capsule Take 40 mg by mouth daily.   Yes [provider]  folic acid (FOLVITE) 800 MCG tablet Take 800 mcg by mouth daily.   Yes [provider]  hydrochlorothiazide (HYDRODIURIL) 50 MG tablet Take 50 mg by mouth daily.   Yes [provider]  lisinopril (PRINIVIL,ZESTRIL) 5 MG tablet Take 5 mg by mouth daily.   Yes [provider]  metFORMIN (GLUCOPHAGE) 1000 MG tablet Take 1,000 mg by mouth 2 (two) times daily with a meal.   Yes [provider]  methotrexate (RHEUMATREX) 2.5 MG tablet Take 15 mg by mouth once a week.   Yes [provider]  metoprolol tartrate (LOPRESSOR) 50 MG tablet Take 50 mg by mouth 2 (two) times daily.   Yes [provider]  Omega-3 Fatty Acids (FISH OIL) 1000 MG CAPS Take 1,000 mg by mouth daily.   Yes [provider]  QUEtiapine Fumarate (SEROQUEL PO) Take 500 mg by mouth 2 (two) times daily.   Yes [provider]  ranitidine (ZANTAC) 150 MG tablet Take 150 mg by mouth daily.   Yes [provider]  rivaroxaban (XARELTO) 10 MG  TABS tablet Take 10 mg by mouth daily.   Yes [provider]  senna-docusate (SENOKOT-S) 8.6-50 MG tablet Take 2 tablets by mouth at bedtime.   Yes [provider]  sodium chloride 1 g tablet Take 1 g by mouth daily.   Yes [provider]   No Known Allergies  FAMILY HISTORY:  family history is not on file. SOCIAL HISTORY:  reports that he has been smoking cigarettes.  He started smoking about 19 years ago. He has a 40.00 pack-year smoking history. He has never used smokeless tobacco. He reports that he does not drink alcohol or use drugs.  REVIEW OF SYSTEMS:   Unable to obtain due to critical illness   VITAL SIGNS: Temp:  [97.9 F (36.6 C)-99.4 F (37.4 C)] 98.1 F (36.7 C) (05/04 1200) Pulse Rate:  [60-82] 73 (05/04 1200) Resp:  [14-25] 16 (05/04 1100) BP: (84-124)/(46-97) 98/66 (05/04 1200) SpO2:  [87 %-100 %] 87 % (05/04 1200) FiO2 (%):  [60 %-90 %] 60 % (05/04 1058)  Physical Examination:  RASS of -2 on Precedex Trach in place on vent and no distress, BEAE and no rales. Improved secretions S1 & S2 audible and no murmur Benignabdomen with normalperistalses Anasarca and scrotal edema (improved)  ASSESSMENT / PLAN:  Acute on chronic respiratory failure with vent dependency with picture of ARDS 70% and PEEP of 12.P/F 87% -Monitor ABG, optimize vent settings, monitor P/F and PIP  A fib with RVR, CHF  with dilated cardiomyopathy with LV EF 20%. 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 Subtherapeutic Valproic acid(improved) -Optimize meds and monitor level.  Pneumonia (HCAPl; worsening infiltrates + increased FIO2 requirement and secretions).improvedbil. Airspace disease with pulmonary edema. Resp Cult GPC and GPR. MRSA PCR +ve -Merop + vanc. Follow with ID recommendation for antimicrobials. -Monitor CXR  + CBC + FIO2 and cultrues. -Acetylcyst + Bronchodilators Neb because of thick respiratory secretions. -Diuresis to improve lung compliance.  Sepsis  -Merop + vanc +  Eraxes as per ID -Follow with culture   HTN -Optimize antihypertensives and monitor hemodynamics  DM -Glycemic control  AKIand mild prerenal azotemia. -Monitor renal panle and optimize hydration / diuresis.  Fecal impaction and constipation (resolved). Ileus on KUB -Senokot-S -Reglan  Anemiawith HB droped1.8 gm within the last 72h on Xeralto. H/o GI bleeding -Keep HB > 7 gm/dl. -Off Xeralto -Follow with stool for occult blood  Dysphagia -PEG for feeding  Hypokalemia -Replete and monitor electrolytes.  Full code  DVT & GI Prophylaxis. Continue with supportive care  Critical care time 45 min

## 2018-02-01 NOTE — Progress Notes (Addendum)
Patient ID: Tommy Mathews, male   DOB: September 14, 1961, 57 y.o.   MRN: [id number]  [facility] PROGRESS NOTE  Tommy Mathews QQV:[id number] DOB: 06-22-1961 DOA: 01/06/2018 PCP: Isaias Cowman, MD  HPI/Subjective: Patient on the vent  Objective: Vitals:   02/01/18 1300 02/01/18 1400  BP: 104/62 122/72  Pulse: 63 67  Resp:  15  Temp: (!) 97 F (36.1 C) (!) 97.2 F (36.2 C)  SpO2: 98% 98%    Filed Weights   01/19/18 0347 01/20/18 1200 01/26/18 0418  Weight: (!) 136.7 kg (301 lb 5.9 oz) (!) 139.5 kg (307 lb 8.7 oz) 133.2 kg (293 lb 10.4 oz)    ROS: Review of Systems  Unable to perform ROS: Acuity of condition   Exam: Physical Exam  Constitutional: He appears lethargic.  HENT:  Nose: No mucosal edema.  Eyes: Pupils are equal, round, and reactive to light. Conjunctivae and lids are normal.  Neck: Carotid bruit is not present. No thyroid mass present.  Cardiovascular: Regular rhythm, S1 normal, S2 normal and normal heart sounds.  Respiratory: He has decreased breath sounds in the right middle field, the right lower field, the left middle field and the left lower field. He has no wheezes. He has rhonchi in the right middle field, the right lower field, the left middle field and the left lower field. He has no rales.  GI: Soft. Bowel sounds are normal. There is tenderness.  Genitourinary:  Genitourinary Comments: 4+ scrotal edema, penile edema  Musculoskeletal:       Right ankle: He exhibits swelling.       Left ankle: He exhibits swelling.  Neurological: He appears lethargic.  Followed a few simple commands today.  Skin: Skin is warm. No rash noted. Nails show no clubbing.  Psychiatric: His affect is blunt.      Data Reviewed: Basic Metabolic Panel: Recent Labs  Lab 01/26/18 0447  01/28/18 0401 01/29/18 0448 01/29/18 1238 01/30/18 0458 01/31/18 0818 02/01/18 0411  NA 142   < > 140 137 138 138 136 139  K 4.1   < > 3.5 3.5 3.4* 3.7 3.7 3.1*  CL 101   < > 96*  95* 92* 92* 91* 92*  CO2 37*   < > 40* 37* 39* 40* 38* 42*  GLUCOSE 104*   < > 191* 137* 169* 130* 107* 86  BUN 21*   < > 22* '16 14 14 18 ' 22*  CREATININE 0.60*   < > 0.76 0.69 0.70 0.71 0.89 0.81  CALCIUM 8.1*   < > 8.1* 8.1* 8.2* 8.1* 8.2* 7.8*  MG 1.7   < > 1.7  --  1.4* 1.8 1.4* 2.3  PHOS 3.5  --  3.3  --   --  3.2 3.2 4.2   < > = values in this interval not displayed.   Liver Function Tests: Recent Labs  Lab 01/26/18 0447 01/30/18 0458 02/01/18 0411  AST 23 33 34  ALT '19 21 23  ' ALKPHOS 74 68 81  BILITOT 0.3 0.3 0.8  PROT 5.2* 5.8* 5.6*  ALBUMIN 1.6* 2.0* 1.8*   CBC: Recent Labs  Lab 01/27/18 0455 01/28/18 0401 01/29/18 0448 01/30/18 1454 01/31/18 0818 02/01/18 0411  WBC 11.4* 11.7* 11.1*  --  12.6* 17.5*  NEUTROABS 7.6* 8.0* 7.7*  --  12.4* 13.7*  HGB 8.3* 7.4* 7.9* 7.1* 9.3* 7.5*  HCT 25.6* 23.1* 24.4* 22.3* 27.2* 23.0*  MCV 84.1 84.6 84.3  --  83.7 84.7  PLT 205 200 210  --  273 236   BNP (last 3 results) Recent Labs    01/06/18 1944 01/16/18 2332  BNP 37.0 402.0*     CBG: Recent Labs  Lab 01/31/18 1940 01/31/18 2309 02/01/18 0358 02/01/18 0727 02/01/18 1141  GLUCAP 140* 132* 80 72 92     Studies: Dg Chest Port 1 View  Result Date: 02/01/2018 CLINICAL DATA:  Pneumonia EXAM: PORTABLE CHEST 1 VIEW COMPARISON:  Chest radiograph from one day prior. FINDINGS: Tracheostomy tube tip overlies the tracheal air column at the thoracic inlet. Right PICC terminates at the cavoatrial junction. Stable cardiomediastinal silhouette with normal heart size. No pneumothorax. No pleural effusion. Hazy airspace opacities throughout both lungs are slightly improved. IMPRESSION: Hazy airspace opacities throughout both lungs, slightly improved. Electronically Signed   By: Tommy Mathews M.D.   On: 02/01/2018 09:03   Dg Chest Port 1 View  Result Date: 01/31/2018 CLINICAL DATA:  PICC line placement. EXAM: PORTABLE CHEST 1 VIEW COMPARISON:  01/31/2018 and 01/30/2018.  FINDINGS: 1225 hours. Right arm PICC extends to the level of the superior cavoatrial junction. Tracheostomy appears unchanged. There are stable diffuse bilateral pulmonary opacities. No pneumothorax or significant pleural effusion. The heart size and mediastinal contours are grossly stable, partially obscured by the airspace opacities. IMPRESSION: PICC line terminates at the level of the superior cavoatrial junction. Persistent bilateral airspace opacities. Electronically Signed   By: Tommy Mathews M.D.   On: 01/31/2018 12:52   Dg Chest Port 1 View  Result Date: 01/31/2018 CLINICAL DATA:  Congestive heart failure. EXAM: PORTABLE CHEST 1 VIEW COMPARISON:  Radiograph of Jan 30, 2018. FINDINGS: Stable cardiomediastinal silhouette. Tracheostomy tube and right sided PICC line are unchanged in position. Hypoinflation of the lungs is noted. Stable bilateral lung opacities are noted concerning for edema or pneumonia. No pneumothorax or significant pleural effusion is noted. Bony thorax is unremarkable. IMPRESSION: Stable support apparatus. Hypoinflation of the lungs. Stable bilateral lung opacities concerning for edema or pneumonia. Electronically Signed   By: Tommy Mathews, M.D.   On: 01/31/2018 07:13    Scheduled Meds: . acetylcysteine  3 mL Nebulization Q8H  . amiodarone  400 mg Per Tube BID  . atorvastatin  80 mg Per Tube q1800  . budesonide (PULMICORT) nebulizer solution  0.5 mg Nebulization BID  . chlorhexidine gluconate (MEDLINE KIT)  15 mL Mouth Rinse BID  . clonazePAM  0.5 mg Per Tube BID  . digoxin  0.25 mg Per Tube Daily  . docusate  100 mg Per Tube BID  . fentaNYL  75 mcg Transdermal Q72H  . FLUoxetine  40 mg Per Tube Daily  . free water  200 mL Per Tube Q8H  . furosemide  20 mg Intravenous Daily  . hydrochlorothiazide  25 mg Oral Daily  . insulin aspart  0-20 Units Subcutaneous Q4H  . insulin glargine  20 Units Subcutaneous QHS  . ipratropium-albuterol  3 mL Nebulization Q8H  .  lisinopril  5 mg Per Tube Daily  . mouth rinse  15 mL Mouth Rinse 10 times per day  . metoCLOPramide  10 mg Oral Q12H  . metoprolol tartrate  25 mg Per Tube Q6H  . nystatin  5 mL Oral QID  . pantoprazole sodium  40 mg Per Tube Daily  . Valproate Sodium  1,000 mg Per Tube BID   Continuous Infusions: . sodium chloride    . anidulafungin    . dexmedetomidine (PRECEDEX) IV infusion 0.201 mcg/kg/hr (02/01/18 0400)  . feeding supplement (GLUCERNA 1.5 CAL)  Stopped (01/31/18 0800)  . meropenem (MERREM) IV 1 g (02/01/18 0550)  . vancomycin Stopped (02/01/18 0549)    Assessment/Plan:  1. Ventilator associated pneumonia continue on meropenem and vancomycin.  2. Acute hypoxic respiratory failure.  Continue current ventilator 3. Atrial fibrillation with rapid ventricular response, episodes of SVT.  Patient on amiodarone, digoxin and metoprolol.   4. Acute on chronic systolic congestive heart failure with anasarca and scrotal edema.  On IV Lasix 20 5. Acute encephalopathy.   history of traumatic brain injury. 6. Previous septic shock and pneumonia treated already. 7. Gastrointestinal bleed. on PPI.  Endoscopy negative.   Xarelto stopped and patient was given another unit of blood.   8. Acute kidney injury resolved. 9. Type 2 diabetes mellitus on glargine insulin and sliding scale 10. Nutrition on PEG tube feeding per intensivist  11. depression on Prozac and Depakene. 12. Thrush on nystatin swish and swallow 13. Hyperlipidemia unspecified on atorvastatin  Code Status:     Code Status Orders  (From admission, onward)        Start     Ordered   01/06/18 2227  Full code  Continuous     01/06/18 2226    Code Status History    This patient has a current code status but no historical code status.     Family Communication:   mother and patient's sister at the bedside Disposition Plan:  will need long-term placement on a facility that can handle a ventilator  Consultants:  Critical  care specialist  Cardiology  Time spent: 28 minutes  Juris Gosnell [facility]

## 2018-02-01 NOTE — Progress Notes (Addendum)
Pharmacy Electrolyte Monitoring Consult:  Pharmacy consulted to assist in monitoring and replacing electrolytes in this 57 y.o. male admitted on 01/06/2018   Patient requiring mechanical ventilation and has PEG tube.   Labs:  Sodium (mmol/L)  Date Value  02/01/2018 139   Potassium (mmol/L)  Date Value  02/01/2018 3.1 (L)   Magnesium (mg/dL)  Date Value  16/07/9603 2.3   Phosphorus (mg/dL)  Date Value  54/06/8118 4.2   Calcium (mg/dL)  Date Value  14/78/2956 7.8 (L)   Albumin (g/dL)  Date Value  21/30/8657 1.8 (L)    Plan: Patient ordered furosemide  IV Q12hr x 2 doses. Will order potassium VT x 1.   5/4: K 3.1, Mag 2.3, Phos 4.2  Patient on hydrochlorothiazide daily. Patient also on digoxin. Will order KCL packet 20 meq per tube q2h x 2 doses and recheck K+ at 1930.  Will recheck electrolytes with am labs.   Pharmacy will continue to monitor and adjust per consult.   Emberlie Gotcher A 02/01/2018 9:09 AM

## 2018-02-01 NOTE — Progress Notes (Signed)
Pharmacy Electrolyte Monitoring Consult:  Pharmacy consulted to assist in monitoring and replacing electrolytes in this 57 y.o. male admitted on 01/06/2018   Patient requiring mechanical ventilation and has PEG tube.   Labs:  Sodium (mmol/L)  Date Value  02/01/2018 139   Potassium (mmol/L)  Date Value  02/01/2018 3.2 (L)   Magnesium (mg/dL)  Date Value  16/07/9603 2.3   Phosphorus (mg/dL)  Date Value  54/06/8118 4.2   Calcium (mg/dL)  Date Value  14/78/2956 7.8 (L)   Albumin (g/dL)  Date Value  21/30/8657 1.8 (L)    Plan: Patient ordered furosemide  IV Q12hr x 2 doses. Will order potassium VT x 1.   5/4 PM : K+ 3.2. Will order KCL x2.   Will recheck electrolytes with am labs.   Pharmacy will continue to monitor and adjust per consult.   Gardner Candle, PharmD, BCPS Clinical Pharmacist 02/01/2018 8:31 PM

## 2018-02-01 NOTE — Progress Notes (Addendum)
Pharmacy Antibiotic Note  Tommy Mathews is a 57 y.o. male being treated for possible ventilator assiociated pneumonia.  Pharmacy has been consulted for meropenem and vancomycin dosing. Patient also initiated on anidulafungin on 5/3. Patient currently requiring mechanical ventilation and has a PEG. PICC line changed on 5/3.   Patient previously treated for aspiration pneumonia this admission.   Plan: 5/4 2007 Vanc Trough subtherapeutic at 13. Will increase dose to Vancomycin 1500 every 8 hours.Calulated trough at Css is 16. Trough level prior to 4th dose.   New: Ke: 0.092   T1/2: 7.53   Vd: 93  Continue Meropenem 1g IV Q8hr.    Height:  (177.8 cm) Weight: 293 lb 10.4 oz (133.2 kg) IBW/kg (Calculated) : 73  Adjusted Body Weight: 98kg   Temp (24hrs), Avg:97.4 F (36.3 C), Min:95.9 F (35.5 C), Max:99.2 F (37.3 C)  Recent Labs  Lab 01/27/18 0455 01/28/18 0401 01/29/18 0448 01/29/18 1238 01/30/18 0458 01/31/18 0817 01/31/18 0818 01/31/18 1049 02/01/18 0411 02/01/18 2007  WBC 11.4* 11.7* 11.1*  --   --   --  12.6*  --  17.5*  --   CREATININE 0.79 0.76 0.69 0.70 0.71  --  0.89  --  0.81  --   LATICACIDVEN  --   --   --   --   --  2.2*  --  1.3  --   --   VANCOTROUGH  --   --   --   --   --   --   --   --   --  13*    Estimated Creatinine Clearance: 141.5 mL/min (by C-G formula based on SCr of 0.81 mg/dL).    No Known Allergies  Antimicrobials this admission: Vancomycin 5/3 >>  Meropenem 5/3 >>  Anidulafungin 5/3 >>  Microbiology results: 5/3 BCx: NGTD 5/3 UCx: NG  5/3 Tracheal Aspirate: mod GPC, GPR  4/20 MRSA PCR: positive  Thank you for allowing pharmacy to be a part of this patient's care.  Gardner Candle, PharmD, BCPS Clinical Pharmacist 02/01/2018 8:40 PM

## 2018-02-02 ENCOUNTER — Inpatient Hospital Stay: Payer: Medicaid Other

## 2018-02-02 LAB — BLOOD GAS, ARTERIAL
Acid-Base Excess: 17 mmol/L — ABNORMAL HIGH (ref 0.0–2.0)
BICARBONATE: 43.7 mmol/L — AB (ref 20.0–28.0)
FIO2: 0.6
MECHVT: 500 mL
Mechanical Rate: 14
O2 SAT: 99.1 %
PATIENT TEMPERATURE: 37
PCO2 ART: 69 mmHg — AB (ref 32.0–48.0)
PEEP: 12 cmH2O
PH ART: 7.41 (ref 7.350–7.450)
PO2 ART: 135 mmHg — AB (ref 83.0–108.0)

## 2018-02-02 LAB — CULTURE, RESPIRATORY W GRAM STAIN

## 2018-02-02 LAB — BASIC METABOLIC PANEL
ANION GAP: 4 — AB (ref 5–15)
BUN: 17 mg/dL (ref 6–20)
CHLORIDE: 98 mmol/L — AB (ref 101–111)
CO2: 37 mmol/L — AB (ref 22–32)
Calcium: 7.5 mg/dL — ABNORMAL LOW (ref 8.9–10.3)
Creatinine, Ser: 0.62 mg/dL (ref 0.61–1.24)
GFR calc Af Amer: >60 mL/min (ref >60)
Glucose, Bld: 125 mg/dL — ABNORMAL HIGH (ref 65–99)
POTASSIUM: 3.6 mmol/L (ref 3.5–5.1)
SODIUM: 139 mmol/L (ref 135–145)

## 2018-02-02 LAB — GLUCOSE, CAPILLARY
GLUCOSE-CAPILLARY: 131 mg/dL — AB (ref 65–99)
GLUCOSE-CAPILLARY: 122 mg/dL — AB (ref 65–99)
Glucose-Capillary: 100 mg/dL — ABNORMAL HIGH (ref 65–99)
Glucose-Capillary: 130 mg/dL — ABNORMAL HIGH (ref 65–99)
Glucose-Capillary: 107 mg/dL — ABNORMAL HIGH (ref 65–99)

## 2018-02-02 LAB — CBC WITH DIFFERENTIAL/PLATELET
BASOS PCT: 0 %
Basophils Absolute: 0 10*3/uL (ref 0–0.1)
EOS ABS: 0.2 10*3/uL (ref 0–0.7)
Eosinophils Relative: 2 %
HCT: 22.2 % — ABNORMAL LOW (ref 40.0–52.0)
Hemoglobin: 7.3 g/dL — ABNORMAL LOW (ref 13.0–18.0)
Lymphocytes Relative: 9 %
Lymphs Abs: 1.2 10*3/uL (ref 1.0–3.6)
MCH: 27.8 pg (ref 26.0–34.0)
MCHC: 32.7 g/dL (ref 32.0–36.0)
MCV: 84.8 fL (ref 80.0–100.0)
MONO ABS: 2.2 10*3/uL — AB (ref 0.2–1.0)
MONOS PCT: 17 %
Neutro Abs: 9.5 10*3/uL — ABNORMAL HIGH (ref 1.4–6.5)
Neutrophils Relative %: 72 %
PLATELETS: 273 10*3/uL (ref 150–440)
RBC: 2.62 MIL/uL — ABNORMAL LOW (ref 4.40–5.90)
RDW: 18.3 % — ABNORMAL HIGH (ref 11.5–14.5)
WBC: 13.1 10*3/uL — ABNORMAL HIGH (ref 3.8–10.6)

## 2018-02-02 LAB — MAGNESIUM: MAGNESIUM: 1.8 mg/dL (ref 1.7–2.4)

## 2018-02-02 LAB — CULTURE, RESPIRATORY: CULTURE: NORMAL

## 2018-02-02 LAB — PHOSPHORUS: Phosphorus: 3.9 mg/dL (ref 2.5–4.6)

## 2018-02-02 MED ORDER — MAGNESIUM SULFATE 2 GM/50ML IV SOLN
2.0000 g | Freq: Once | INTRAVENOUS | Status: AC
  Administered 2018-02-02: 2 g via INTRAVENOUS
  Filled 2018-02-02: qty 50

## 2018-02-02 MED ORDER — POTASSIUM CHLORIDE 20 MEQ PO PACK
20.0000 meq | PACK | Freq: Once | ORAL | Status: AC
  Administered 2018-02-02: 20 meq via ORAL
  Filled 2018-02-02: qty 1

## 2018-02-02 NOTE — Progress Notes (Signed)
Name: Tommy Mathews MRN: [id number] DOB: [birth year]     CONSULTATION DATE: 01/06/2018  Subjective & Objective: No major issues last night, he continues on vent, Precedex, afebrile and controlled ventricular rate PAST MEDICAL HISTORY :   has a past medical history of Diabetes (HCC), GERD (gastroesophageal reflux disease), HLD (hyperlipidemia), and HTN (hypertension).  has a past surgical history that includes PEG placement (N/A, 01/21/2018) and Tracheostomy tube placement (N/A, 01/23/2018). Prior to Admission medications   Medication Sig Start Date End Date Taking? Authorizing Provider  atorvastatin (LIPITOR) 80 MG tablet Take 80 mg by mouth at bedtime.   Yes [provider]  divalproex (DEPAKOTE ER) 500 MG 24 hr tablet Take 500 mg by mouth 2 (two) times daily.   Yes [provider]  estradiol (ESTRACE) 2 MG tablet Take 2 mg by mouth daily.   Yes [provider]  FLUoxetine (PROZAC) 40 MG capsule Take 40 mg by mouth daily.   Yes [provider]  folic acid (FOLVITE) 800 MCG tablet Take 800 mcg by mouth daily.   Yes [provider]  hydrochlorothiazide (HYDRODIURIL) 50 MG tablet Take 50 mg by mouth daily.   Yes [provider]  lisinopril (PRINIVIL,ZESTRIL) 5 MG tablet Take 5 mg by mouth daily.   Yes [provider]  metFORMIN (GLUCOPHAGE) 1000 MG tablet Take 1,000 mg by mouth 2 (two) times daily with a meal.   Yes [provider]  methotrexate (RHEUMATREX) 2.5 MG tablet Take 15 mg by mouth once a week.   Yes [provider]  metoprolol tartrate (LOPRESSOR) 50 MG tablet Take 50 mg by mouth 2 (two) times daily.   Yes [provider]  Omega-3 Fatty Acids (FISH OIL) 1000 MG CAPS Take 1,000 mg by mouth daily.   Yes [provider]  QUEtiapine Fumarate (SEROQUEL PO) Take 500 mg by mouth 2 (two) times daily.   Yes [provider]  ranitidine (ZANTAC) 150 MG tablet Take 150 mg by mouth daily.    Yes [provider]  rivaroxaban (XARELTO) 10 MG TABS tablet Take 10 mg by mouth daily.   Yes [provider]  senna-docusate (SENOKOT-S) 8.6-50 MG tablet Take 2 tablets by mouth at bedtime.   Yes [provider]  sodium chloride 1 g tablet Take 1 g by mouth daily.   Yes [provider]   No Known Allergies  FAMILY HISTORY:  family history is not on file. SOCIAL HISTORY:  reports that he has been smoking cigarettes.  He started smoking about 19 years ago. He has a 40.00 pack-year smoking history. He has never used smokeless tobacco. He reports that he does not drink alcohol or use drugs.  REVIEW OF SYSTEMS:   Unable to obtain due to critical illness   VITAL SIGNS: Temp:  [95.9 F (35.5 C)-98.1 F (36.7 C)] 95.9 F (35.5 C) (05/05 0600) Pulse Rate:  [47-74] 51 (05/05 0600) Resp:  [14-25] 14 (05/05 0600) BP: (98-131)/(49-78) 126/77 (05/05 0600) SpO2:  [87 %-100 %] 100 % (05/05 0723) FiO2 (%):  [60 %] 60 % (05/05 0723)  Physical Examination:  RASS of -2 on Precedex, following commands Trach in place on vent and no distress, BEAE and no rales. Improved secretions S1 & S2 audible and no murmur Benignabdomen with normalperistalses Anasarca and scrotal edema(improved  ASSESSMENT / PLAN:  Acute on chronicrespiratory failurewith vent dependency with picture of ARDS.P/F 225% on 60% and PEEP of 12 (improved) -Monitor ABG, optimize vent settings, monitor P/F  and PIP  A fib with RVR, CHF with dilated cardiomyopathy with LV EF 20%. 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 Subtherapeutic Valproic acid(improved) -Optimize meds and monitor level.  Pneumonia(HCAPl; infiltrates + increased FIO2 requirement and secretions).improvedbil. Airspace disease with pulmonary edema. Resp Cult GPC and GPR. MRSA PCR  +ve -Merop + vanc. Follow with ID recommendation for antimicrobials. -Monitor CXR + CBC + FIO2and cultrues. -Acetylcyst + Bronchodilators Neb because of thick respiratory secretions. -Diuresis to improve lung compliance.  Sepsis  -Merop + vanc +Eraxesas per ID -Follow with culture  HTN -Optimize antihypertensives and monitor hemodynamics  DM -Glycemic control  AKIand mild prerenal azotemia. -Monitor renal panle and optimize hydration / diuresis.  Fecal impaction and constipation (resolved). Ileus on KUB -Senokot-S -Reglan  Anemia requiring  transfusion Xeralto. H/o GI bleeding -Keep HB > 7 gm/dl. -Off Xeralto -Follow with stool for occult blood  Dysphagia -PEG for feeding  Full code  DVT & GI Prophylaxis. Continue with supportive care Patient'  Critical care time35 min

## 2018-02-02 NOTE — Progress Notes (Signed)
Patient ID: Tommy Mathews, male   DOB: 21-May-1961, 57 y.o.   MRN: [id number]  [facility] PROGRESS NOTE  Lendell Gallick EBR:[id number] DOB: [birth year] DOA: 01/06/2018 PCP: Isaias Cowman, MD  HPI/Subjective: Patient on the vent no major events noted since last night  Objective: Vitals:   02/02/18 0723 02/02/18 1112  BP:    Pulse:    Resp:    Temp:    SpO2: 100% 99%    Filed Weights   01/19/18 0347 01/20/18 1200 01/26/18 0418  Weight: (!) 136.7 kg (301 lb 5.9 oz) (!) 139.5 kg (307 lb 8.7 oz) 133.2 kg (293 lb 10.4 oz)    ROS: Review of Systems  Unable to perform ROS: Acuity of condition   Exam: Physical Exam  Constitutional: He appears lethargic.  HENT:  Nose: No mucosal edema.  Eyes: Pupils are equal, round, and reactive to light. Conjunctivae and lids are normal.  Neck: Carotid bruit is not present. No thyroid mass present.  Cardiovascular: Regular rhythm, S1 normal, S2 normal and normal heart sounds.  Respiratory: He has decreased breath sounds in the right middle field, the right lower field, the left middle field and the left lower field. He has no wheezes. He has rhonchi in the right middle field, the right lower field, the left middle field and the left lower field. He has no rales.  GI: Soft. Bowel sounds are normal. There is tenderness.  Genitourinary:  Genitourinary Comments: 4+ scrotal edema, penile edema  Musculoskeletal:       Right ankle: He exhibits swelling.       Left ankle: He exhibits swelling.  Neurological: He appears lethargic.  Skin: Skin is warm. No rash noted. Nails show no clubbing.  Psychiatric: His affect is blunt.      Data Reviewed: Basic Metabolic Panel: Recent Labs  Lab 01/28/18 0401  01/29/18 1238 01/30/18 0458 01/31/18 0818 02/01/18 0411 02/01/18 2007 02/02/18 0433  NA 140   < > 138 138 136 139  --  139  K 3.5   < > 3.4* 3.7 3.7 3.1* 3.2* 3.6  CL 96*   < > 92* 92* 91* 92*  --  98*  CO2 40*   < > 39* 40* 38* 42*  --   37*  GLUCOSE 191*   < > 169* 130* 107* 86  --  125*  BUN 22*   < > '14 14 18 ' 22*  --  17  CREATININE 0.76   < > 0.70 0.71 0.89 0.81  --  0.62  CALCIUM 8.1*   < > 8.2* 8.1* 8.2* 7.8*  --  7.5*  MG 1.7  --  1.4* 1.8 1.4* 2.3  --  1.8  PHOS 3.3  --   --  3.2 3.2 4.2  --  3.9   < > = values in this interval not displayed.   Liver Function Tests: Recent Labs  Lab 01/30/18 0458 02/01/18 0411  AST 33 34  ALT 21 23  ALKPHOS 68 81  BILITOT 0.3 0.8  PROT 5.8* 5.6*  ALBUMIN 2.0* 1.8*   CBC: Recent Labs  Lab 01/28/18 0401 01/29/18 0448 01/30/18 1454 01/31/18 0818 02/01/18 0411 02/02/18 0433  WBC 11.7* 11.1*  --  12.6* 17.5* 13.1*  NEUTROABS 8.0* 7.7*  --  12.4* 13.7* 9.5*  HGB 7.4* 7.9* 7.1* 9.3* 7.5* 7.3*  HCT 23.1* 24.4* 22.3* 27.2* 23.0* 22.2*  MCV 84.6 84.3  --  83.7 84.7 84.8  PLT 200 210  --  273 236  273   BNP (last 3 results) Recent Labs    01/06/18 1944 01/16/18 2332  BNP 37.0 402.0*     CBG: Recent Labs  Lab 02/01/18 2056 02/01/18 2322 02/02/18 0348 02/02/18 0748 02/02/18 1152  GLUCAP 89 91 131* 100* 130*     Studies: Dg Chest Port 1 View  Result Date: 02/02/2018 CLINICAL DATA:  Pneumonia EXAM: PORTABLE CHEST 1 VIEW COMPARISON:  Chest radiograph from one day prior. FINDINGS: Tracheostomy tube tip overlies the tracheal air column at the thoracic inlet. Right PICC terminates at the cavoatrial junction. Stable cardiomediastinal silhouette with normal heart size. No pneumothorax. No pleural effusion. Severe patchy opacity throughout both lungs is not appreciably changed. IMPRESSION: 1. Stable support structures as detailed. 2. Stable severe diffuse patchy lung opacities suggesting multilobar pneumonia and/or ARDS. Electronically Signed   By: Ilona Sorrel M.D.   On: 02/02/2018 07:14   Dg Chest Port 1 View  Result Date: 02/01/2018 CLINICAL DATA:  Pneumonia EXAM: PORTABLE CHEST 1 VIEW COMPARISON:  Chest radiograph from one day prior. FINDINGS: Tracheostomy tube tip  overlies the tracheal air column at the thoracic inlet. Right PICC terminates at the cavoatrial junction. Stable cardiomediastinal silhouette with normal heart size. No pneumothorax. No pleural effusion. Hazy airspace opacities throughout both lungs are slightly improved. IMPRESSION: Hazy airspace opacities throughout both lungs, slightly improved. Electronically Signed   By: Ilona Sorrel M.D.   On: 02/01/2018 09:03    Scheduled Meds: . acetylcysteine  3 mL Nebulization Q8H  . amiodarone  400 mg Per Tube BID  . atorvastatin  80 mg Per Tube q1800  . budesonide (PULMICORT) nebulizer solution  0.5 mg Nebulization BID  . chlorhexidine gluconate (MEDLINE KIT)  15 mL Mouth Rinse BID  . clonazePAM  0.5 mg Per Tube BID  . digoxin  0.25 mg Per Tube Daily  . docusate  100 mg Per Tube BID  . fentaNYL  75 mcg Transdermal Q72H  . FLUoxetine  40 mg Per Tube Daily  . free water  200 mL Per Tube Q8H  . furosemide  20 mg Intravenous Daily  . hydrochlorothiazide  25 mg Oral Daily  . insulin aspart  0-20 Units Subcutaneous Q4H  . insulin glargine  20 Units Subcutaneous QHS  . ipratropium-albuterol  3 mL Nebulization Q8H  . lisinopril  5 mg Per Tube Daily  . mouth rinse  15 mL Mouth Rinse 10 times per day  . metoCLOPramide  10 mg Oral Q12H  . metoprolol tartrate  25 mg Per Tube Q6H  . nystatin  5 mL Oral QID  . pantoprazole sodium  40 mg Per Tube Daily  . Valproate Sodium  1,000 mg Per Tube BID   Continuous Infusions: . sodium chloride    . anidulafungin    . dexmedetomidine (PRECEDEX) IV infusion 0.5 mcg/kg/hr (02/02/18 0553)  . feeding supplement (GLUCERNA 1.5 CAL) Stopped (01/31/18 0800)  . meropenem (MERREM) IV Stopped (02/02/18 0622)  . vancomycin 1,500 mg (02/02/18 1304)    Assessment/Plan:  1. Ventilator associated pneumonia continue on meropenem and vancomycin.  2. Acute hypoxic respiratory failure.   Continue ventilator 3. Atrial fibrillation with rapid ventricular response, episodes of  SVT.  Patient on amiodarone, digoxin and metoprolol.   4. Acute on chronic systolic congestive heart failure with anasarca and scrotal edema.  On IV Lasix daily 5. Acute encephalopathy.   history of traumatic brain injury. 6. Previous septic shock and pneumonia treated already. 7. Gastrointestinal bleed. on PPI.  Endoscopy negative.  Xarelto stopped and patient was given another unit of blood.  Hemoglobin is trending down continue to monitor 8. Acute kidney injury resolved. 9. Type 2 diabetes mellitus on glargine insulin and sliding scale 10. Nutrition on tube feeding per intensivist 11. Depression on Prozac and Depakene. 12. Thrush on nystatin status post treatment 13. Hyperlipidemia unspecified on atorvastatin  Code Status:     Code Status Orders  (From admission, onward)        Start     Ordered   01/06/18 2227  Full code  Continuous     01/06/18 2226    Code Status History    This patient has a current code status but no historical code status.     Family Communication:   mother and patient's sister at the bedside Disposition Plan:  will need long-term placement on a facility that can handle a ventilator  Consultants:  Critical care specialist  Cardiology  Time spent: 28 minutes  Jenniferlynn Saad [facility]

## 2018-02-02 NOTE — Progress Notes (Signed)
Patient has been resting quietly with eyes closed on 0.5 Precedex. Was restless n 0.4. No indications of pain or discomfort. Required FBS coverage once this shift. Held pacerone and lopressor for low HR.  No other concerns at this time.

## 2018-02-02 NOTE — Progress Notes (Signed)
Pharmacy Antibiotic Note  Tommy Mathews is a 57 y.o. male being treated for possible ventilator assiociated pneumonia.  Pharmacy has been consulted for meropenem and vancomycin dosing. Patient also initiated on anidulafungin on 5/3. Patient currently requiring mechanical ventilation and has a PEG. PICC line changed on 5/3.   Patient previously treated for aspiration pneumonia this admission.   Plan: 5/4 2007 Vanc Trough subtherapeutic at 13. Will increase dose to Vancomycin 1500 every 8 hours.Calulated trough at Css is 16. Trough level prior to 4th dose.   New: Ke: 0.092   T1/2: 7.53   Vd: 93  Continue Meropenem 1g IV Q8hr.    Height:  (177.8 cm) Weight: 293 lb 10.4 oz (133.2 kg) IBW/kg (Calculated) : 73  Adjusted Body Weight: 98kg   Temp (24hrs), Avg:96.4 F (35.8 C), Min:95.9 F (35.5 C), Max:97.5 F (36.4 C)  Recent Labs  Lab 01/28/18 0401 01/29/18 0448 01/29/18 1238 01/30/18 0458 01/31/18 0817 01/31/18 0818 01/31/18 1049 02/01/18 0411 02/01/18 2007 02/02/18 0433  WBC 11.7* 11.1*  --   --   --  12.6*  --  17.5*  --  13.1*  CREATININE 0.76 0.69 0.70 0.71  --  0.89  --  0.81  --  0.62  LATICACIDVEN  --   --   --   --  2.2*  --  1.3  --   --   --   VANCOTROUGH  --   --   --   --   --   --   --   --  13*  --     Estimated Creatinine Clearance: 143.3 mL/min (by C-G formula based on SCr of 0.62 mg/dL).    No Known Allergies  Antimicrobials this admission: Vancomycin 5/3 >>  Meropenem 5/3 >>  Anidulafungin 5/3 >>  Microbiology results: 5/3 BCx: NGTD 5/3 UCx: NG  5/3 Tracheal Aspirate: mod GPC, GPR  4/20 MRSA PCR: positive  Thank you for allowing pharmacy to be a part of this patient's care.  Angelique Blonder, PharmD, BCPS Clinical Pharmacist 02/02/2018 1:43 PM

## 2018-02-02 NOTE — Progress Notes (Signed)
Pharmacy Electrolyte Monitoring Consult:  Pharmacy consulted to assist in monitoring and replacing electrolytes in this 57 y.o. male admitted on 01/06/2018   Patient requiring mechanical ventilation and has PEG tube.   Labs:  Sodium (mmol/L)  Date Value  02/02/2018 139   Potassium (mmol/L)  Date Value  02/02/2018 3.6   Magnesium (mg/dL)  Date Value  16/07/9603 1.8   Phosphorus (mg/dL)  Date Value  54/06/8118 3.9   Calcium (mg/dL)  Date Value  14/78/2956 7.5 (L)   Albumin (g/dL)  Date Value  21/30/8657 1.8 (L)    Plan: Patient ordered furosemide  IV Q12hr x 2 doses. Will order potassium VT x 1.   5/4: K 3.1, Mag 2.3, Phos 4.2  Patient on hydrochlorothiazide daily. Patient also on digoxin. Will order KCL packet 20 meq per tube q2h x 2 doses and recheck K+ at 1930.  5/4 PM : K+ 3.2. Will order KCL x2.   5/5:  K 3.6 Mag 1.8 Phos 3.9  Patient on furosemide 20 mg IV daily, digoxin, hydrochlorothiazide. Will order Magnesium 2 gram IV x 1. Will order KCL 20 meq packet x 1.  Will recheck electrolytes with am labs.   Pharmacy will continue to monitor and adjust per consult.   Angelique Blonder, PharmD, BCPS Clinical Pharmacist 02/02/2018 8:21 AM

## 2018-02-03 ENCOUNTER — Inpatient Hospital Stay: Payer: Medicaid Other

## 2018-02-03 LAB — CBC WITH DIFFERENTIAL/PLATELET
BASOS ABS: 0 10*3/uL (ref 0–0.1)
Band Neutrophils: 2 %
Basophils Relative: 0 %
Blasts: 0 %
EOS PCT: 0 %
Eosinophils Absolute: 0 10*3/uL (ref 0–0.7)
HCT: 23.4 % — ABNORMAL LOW (ref 40.0–52.0)
Hemoglobin: 7.6 g/dL — ABNORMAL LOW (ref 13.0–18.0)
LYMPHS ABS: 1.7 10*3/uL (ref 1.0–3.6)
Lymphocytes Relative: 14 %
MCH: 27.7 pg (ref 26.0–34.0)
MCHC: 32.6 g/dL (ref 32.0–36.0)
MCV: 85 fL (ref 80.0–100.0)
MYELOCYTES: 1 %
Metamyelocytes Relative: 2 %
Monocytes Absolute: 1.2 10*3/uL — ABNORMAL HIGH (ref 0.2–1.0)
Monocytes Relative: 10 %
Neutro Abs: 9.2 10*3/uL — ABNORMAL HIGH (ref 1.4–6.5)
Neutrophils Relative %: 71 %
Other: 0 %
PLATELETS: 370 10*3/uL (ref 150–440)
Promyelocytes Relative: 0 %
RBC: 2.76 MIL/uL — AB (ref 4.40–5.90)
RDW: 18.3 % — ABNORMAL HIGH (ref 11.5–14.5)
WBC: 12.1 10*3/uL — AB (ref 3.8–10.6)
nRBC: 0 /100 WBC

## 2018-02-03 LAB — TYPE AND SCREEN
ABO/RH(D): O POS
Antibody Screen: NEGATIVE
UNIT DIVISION: 0
UNIT DIVISION: 0

## 2018-02-03 LAB — BPAM RBC
BLOOD PRODUCT EXPIRATION DATE: 201905062359
Blood Product Expiration Date: 201905092359
ISSUE DATE / TIME: 201905021829
UNIT TYPE AND RH: 9500
Unit Type and Rh: 9500

## 2018-02-03 LAB — BLOOD GAS, ARTERIAL
ACID-BASE EXCESS: 14.5 mmol/L — AB (ref 0.0–2.0)
BICARBONATE: 41.2 mmol/L — AB (ref 20.0–28.0)
FIO2: 0.6
MECHVT: 500 mL
O2 Saturation: 98.2 %
PATIENT TEMPERATURE: 37
PCO2 ART: 65 mmHg — AB (ref 32.0–48.0)
PEEP/CPAP: 8 cmH2O
PH ART: 7.41 (ref 7.350–7.450)
PO2 ART: 107 mmHg (ref 83.0–108.0)
RATE: 14 resp/min

## 2018-02-03 LAB — BASIC METABOLIC PANEL
Anion gap: 4 — ABNORMAL LOW (ref 5–15)
BUN: 15 mg/dL (ref 6–20)
CALCIUM: 7.9 mg/dL — AB (ref 8.9–10.3)
CHLORIDE: 97 mmol/L — AB (ref 101–111)
CO2: 38 mmol/L — ABNORMAL HIGH (ref 22–32)
CREATININE: 0.71 mg/dL (ref 0.61–1.24)
GFR calc Af Amer: >60 mL/min (ref >60)
Glucose, Bld: 185 mg/dL — ABNORMAL HIGH (ref 65–99)
Potassium: 4.1 mmol/L (ref 3.5–5.1)
SODIUM: 139 mmol/L (ref 135–145)

## 2018-02-03 LAB — GLUCOSE, CAPILLARY
GLUCOSE-CAPILLARY: 164 mg/dL — AB (ref 65–99)
GLUCOSE-CAPILLARY: 166 mg/dL — AB (ref 65–99)
Glucose-Capillary: 109 mg/dL — ABNORMAL HIGH (ref 65–99)
Glucose-Capillary: 126 mg/dL — ABNORMAL HIGH (ref 65–99)
Glucose-Capillary: 162 mg/dL — ABNORMAL HIGH (ref 65–99)
Glucose-Capillary: 167 mg/dL — ABNORMAL HIGH (ref 65–99)

## 2018-02-03 LAB — VANCOMYCIN, RANDOM: Vancomycin Rm: 12

## 2018-02-03 LAB — VANCOMYCIN, TROUGH: VANCOMYCIN TR: 28 ug/mL — AB (ref 15–20)

## 2018-02-03 MED ORDER — CLONAZEPAM 1 MG PO TABS
1.0000 mg | ORAL_TABLET | Freq: Two times a day (BID) | ORAL | Status: DC
  Administered 2018-02-03 – 2018-02-10 (×15): 1 mg
  Filled 2018-02-03 (×15): qty 1

## 2018-02-03 MED ORDER — FUROSEMIDE 10 MG/ML IJ SOLN
40.0000 mg | Freq: Every day | INTRAMUSCULAR | Status: DC
  Administered 2018-02-03 – 2018-02-10 (×8): 40 mg via INTRAVENOUS
  Filled 2018-02-03 (×8): qty 4

## 2018-02-03 MED ORDER — VANCOMYCIN HCL 10 G IV SOLR
1500.0000 mg | Freq: Two times a day (BID) | INTRAVENOUS | Status: AC
  Administered 2018-02-03 – 2018-02-07 (×9): 1500 mg via INTRAVENOUS
  Filled 2018-02-03 (×11): qty 1500

## 2018-02-03 MED ORDER — QUETIAPINE FUMARATE 25 MG PO TABS
25.0000 mg | ORAL_TABLET | Freq: Every day | ORAL | Status: DC
  Administered 2018-02-03: 25 mg via ORAL
  Filled 2018-02-03: qty 1

## 2018-02-03 MED ORDER — VANCOMYCIN HCL 10 G IV SOLR: 1500.0000 mg | INTRAVENOUS | Status: DC | PRN

## 2018-02-03 MED ORDER — LORAZEPAM 2 MG/ML IJ SOLN: 1.0000 mg | Freq: Once | INTRAMUSCULAR | Status: DC

## 2018-02-03 NOTE — Progress Notes (Signed)
Pt has had 2 large watery BM in 2 hours. Per Dr. Lonn Georgia, RN may place rectal tube

## 2018-02-03 NOTE — Care Management (Signed)
RNCM spoke with ICU RN. Patient is still requiring Precedex for agitation.

## 2018-02-03 NOTE — Progress Notes (Signed)
Follow up - Critical Care Medicine Note  Patient Details:    Tommy Mathews is an 57 y.o. male.with a past medical history remarkable for diabetes, gastroesophageal reflux disease, hyperlipidemia, hypertension, traumatic brain injury, status post tracheostomy and PEG tube placement, is in the intensive care unit for sepsis, severe pneumonia, suspected aspiration  Lines, Airways, Drains: PICC Triple Lumen 01/31/18 PICC Right 46 cm 0 cm (Active)  Indication for Insertion or Continuance of Line Administration of hyperosmolar/irritating solutions (i.e. TPN, Vancomycin, etc.) 02/03/2018  8:00 AM  Exposed Catheter (cm) 0 cm 01/31/2018 12:00 PM  Site Assessment Clean;Dry;Intact 02/02/2018  8:50 PM  Lumen #1 Status Infusing 02/02/2018  8:50 PM  Lumen #2 Status Infusing 02/02/2018  8:50 PM  Lumen #3 Status Saline locked 02/02/2018  8:50 PM  Dressing Type Transparent 02/02/2018  8:50 PM  Dressing Status Clean;Dry;Intact 02/02/2018  8:50 PM  Line Care Connections checked and tightened 02/02/2018  8:50 PM  Line Adjustment (NICU/IV Team Only) No 02/01/2018 12:39 PM  Dressing Change Due 02/07/18 02/02/2018 11:06 AM     Gastrostomy/Enterostomy PEG-jejunostomy (Active)  Surrounding Skin Dry;Intact 02/03/2018  3:42 AM  Tube Status Patent 02/03/2018  3:42 AM  Drainage Appearance None 02/03/2018  3:42 AM  Dressing Status Clean;Dry;Intact 02/03/2018  3:42 AM  Dressing Intervention New dressing 01/31/2018  6:30 PM  Dressing Type Split gauze 02/03/2018  3:42 AM  Dressing Change Due 02/06/18 02/02/2018 11:30 PM  G Port Intake (mL) 80 ml 01/30/2018  5:30 PM     Urethral Catheter (Active)  Indication for Insertion or Continuance of Catheter Bladder outlet obstruction / other urologic reason 02/03/2018  3:42 AM  Site Assessment Intact;Clean 02/03/2018  3:42 AM  Catheter Maintenance Bag below level of bladder;No dependent loops;Seal intact;Bag emptied prior to transport 02/03/2018  3:42 AM  Collection Container Standard drainage bag 02/03/2018  3:42 AM   Securement Method Leg strap 02/03/2018  3:42 AM  Urinary Catheter Interventions Unclamped 02/03/2018  3:42 AM  Input (mL) 0 mL 01/15/2018 11:47 PM  Output (mL) 675 mL 02/03/2018  6:00 AM    Anti-infectives:  Anti-infectives (From admission, onward)   Start     Dose/Rate Route Frequency Ordered Stop   02/03/18 0522  vancomycin (VANCOCIN) 1,500 mg in sodium chloride 0.9 % 500 mL IVPB     1,500 mg 250 mL/hr over 120 Minutes Intravenous As needed 02/03/18 0523     02/01/18 2100  vancomycin (VANCOCIN) 1,500 mg in sodium chloride 0.9 % 500 mL IVPB  Status:  Discontinued     1,500 mg 250 mL/hr over 120 Minutes Intravenous Every 8 hours 02/01/18 2038 02/03/18 0523   02/01/18 1800  anidulafungin (ERAXIS) 100 mg in sodium chloride 0.9 % 100 mL IVPB     100 mg 78 mL/hr over 100 Minutes Intravenous Every 24 hours 01/31/18 1734     02/01/18 1500  anidulafungin (ERAXIS) 100 mg in sodium chloride 0.9 % 100 mL IVPB  Status:  Discontinued     100 mg 78 mL/hr over 100 Minutes Intravenous Every 24 hours 01/31/18 1540 01/31/18 1734   01/31/18 2000  vancomycin (VANCOCIN) 1,250 mg in sodium chloride 0.9 % 250 mL IVPB  Status:  Discontinued     1,250 mg 166.7 mL/hr over 90 Minutes Intravenous Every 8 hours 01/31/18 1734 02/01/18 2038   01/31/18 1900  vancomycin (VANCOCIN) 1,250 mg in sodium chloride 0.9 % 250 mL IVPB  Status:  Discontinued     1,250 mg 166.7 mL/hr over 90 Minutes Intravenous Every  8 hours 01/31/18 1728 01/31/18 1734   01/31/18 1545  anidulafungin (ERAXIS) 200 mg in sodium chloride 0.9 % 200 mL IVPB     200 mg 78 mL/hr over 200 Minutes Intravenous  Once 01/31/18 1540 01/31/18 2107   01/31/18 0930  vancomycin (VANCOCIN) IVPB 1000 mg/200 mL premix     1,000 mg 200 mL/hr over 60 Minutes Intravenous STAT 01/31/18 0922 01/31/18 1545   01/31/18 0800  meropenem (MERREM) 1 g in sodium chloride 0.9 % 100 mL IVPB     1 g 200 mL/hr over 30 Minutes Intravenous Every 8 hours 01/31/18 0748     01/21/18  0600  ceFAZolin (ANCEF) 3 g in dextrose 5 % 50 mL IVPB     3 g 130 mL/hr over 30 Minutes Intravenous  Once 01/20/18 1829 01/21/18 0654   01/20/18 0600  vancomycin (VANCOCIN) 1,250 mg in sodium chloride 0.9 % 250 mL IVPB  Status:  Discontinued     1,250 mg 166.7 mL/hr over 90 Minutes Intravenous Every 12 hours 01/20/18 0153 01/20/18 0905   01/19/18 0200  vancomycin (VANCOCIN) 1,250 mg in sodium chloride 0.9 % 250 mL IVPB  Status:  Discontinued     1,250 mg 166.7 mL/hr over 90 Minutes Intravenous Every 8 hours 01/18/18 1953 01/20/18 0153   01/18/18 2000  vancomycin (VANCOCIN) 1,250 mg in sodium chloride 0.9 % 250 mL IVPB     1,250 mg 166.7 mL/hr over 90 Minutes Intravenous  Once 01/18/18 1953 01/18/18 2246   01/18/18 2000  ceFEPIme (MAXIPIME) 2 g in sodium chloride 0.9 % 100 mL IVPB  Status:  Discontinued     2 g 200 mL/hr over 30 Minutes Intravenous Every 8 hours 01/18/18 1953 01/20/18 0905   01/12/18 0900  vancomycin (VANCOCIN) IVPB 1000 mg/200 mL premix  Status:  Discontinued     1,000 mg 200 mL/hr over 60 Minutes Intravenous Every 24 hours 01/11/18 1329 01/13/18 1548   01/10/18 1000  ceFEPIme (MAXIPIME) 2 g in sodium chloride 0.9 % 100 mL IVPB     2 g 200 mL/hr over 30 Minutes Intravenous Every 12 hours 01/10/18 0919 01/13/18 2129   01/09/18 2200  vancomycin (VANCOCIN) IVPB 750 mg/150 ml premix  Status:  Discontinued     750 mg 150 mL/hr over 60 Minutes Intravenous Every 12 hours 01/09/18 1952 01/11/18 1329   01/08/18 1503  Ampicillin-Sulbactam (UNASYN) 3 g in sodium chloride 0.9 % 100 mL IVPB  Status:  Discontinued     3 g 200 mL/hr over 30 Minutes Intravenous Every 6 hours 01/08/18 0931 01/08/18 1024   01/08/18 1230  ceFEPIme (MAXIPIME) 2 g in sodium chloride 0.9 % 100 mL IVPB  Status:  Discontinued     2 g 200 mL/hr over 30 Minutes Intravenous Every 12 hours 01/08/18 1151 01/10/18 0919   01/08/18 1100  metroNIDAZOLE (FLAGYL) IVPB 500 mg  Status:  Discontinued     500 mg 100 mL/hr  over 60 Minutes Intravenous Every 8 hours 01/08/18 1024 01/13/18 1547   01/08/18 0800  Ampicillin-Sulbactam (UNASYN) 3 g in sodium chloride 0.9 % 100 mL IVPB  Status:  Discontinued     3 g 200 mL/hr over 30 Minutes Intravenous Every 12 hours 01/07/18 1906 01/08/18 0931   01/08/18 0600  vancomycin (VANCOCIN) IVPB 1000 mg/200 mL premix  Status:  Discontinued     1,000 mg 200 mL/hr over 60 Minutes Intravenous Every 12 hours 01/08/18 0545 01/09/18 1952   01/07/18 2100  ceFEPIme (MAXIPIME) 2 g  in sodium chloride 0.9 % 100 mL IVPB  Status:  Discontinued     2 g 200 mL/hr over 30 Minutes Intravenous Every 24 hours 01/06/18 2349 01/07/18 1008   01/07/18 1800  Ampicillin-Sulbactam (UNASYN) 3 g in sodium chloride 0.9 % 100 mL IVPB  Status:  Discontinued     3 g 200 mL/hr over 30 Minutes Intravenous Every 6 hours 01/07/18 1436 01/07/18 1906   01/07/18 1015  Ampicillin-Sulbactam (UNASYN) 3 g in sodium chloride 0.9 % 100 mL IVPB  Status:  Discontinued     3 g 200 mL/hr over 30 Minutes Intravenous Every 12 hours 01/07/18 1008 01/07/18 1436   01/07/18 0600  vancomycin (VANCOCIN) 1,250 mg in sodium chloride 0.9 % 250 mL IVPB  Status:  Discontinued     1,250 mg 166.7 mL/hr over 90 Minutes Intravenous Every 24 hours 01/06/18 2349 01/07/18 1917   01/06/18 2100  ceFEPIme (MAXIPIME) 2 g in sodium chloride 0.9 % 100 mL IVPB     2 g 200 mL/hr over 30 Minutes Intravenous  Once 01/06/18 2056 01/06/18 2320   01/06/18 2100  vancomycin (VANCOCIN) IVPB 1000 mg/200 mL premix     1,000 mg 200 mL/hr over 60 Minutes Intravenous  Once 01/06/18 2056 01/07/18 0024      Microbiology: Results for orders placed or performed during the hospital encounter of 01/06/18  Blood Culture (routine x 2)     Status: None   Collection Time: 01/06/18  7:44 PM  Result Value Ref Range Status   Specimen Description BLOOD BLOOD LEFT WRIST  Final   Special Requests   Final    BOTTLES DRAWN AEROBIC AND ANAEROBIC Blood Culture adequate  volume   Culture   Final    NO GROWTH 5 DAYS Performed at [facility], [address], [place], [place] [address]    Report Status 01/11/2018 FINAL  Final  Urine culture     Status: Abnormal   Collection Time: 01/06/18  7:44 PM  Result Value Ref Range Status   Specimen Description   Final    URINE, RANDOM Performed at [facility], [address], [place], [place] [address]    Special Requests   Final    NONE Performed at [facility], [address] [place], [place] [address]    Culture (A)  Final    10,000 COLONIES/mL STAPHYLOCOCCUS SPECIES (COAGULASE NEGATIVE) CALL MICROBIOLOGY LAB IF SENSITIVITIES ARE REQUIRED. Performed at [facility], [address] [place], [place] [address]    Report Status 01/08/2018 FINAL  Final  MRSA PCR Screening     Status: Abnormal   Collection Time: 01/06/18 11:51 PM  Result Value Ref Range Status   MRSA by PCR POSITIVE (A) NEGATIVE Final    Comment:        The GeneXpert MRSA Assay (FDA approved for NASAL specimens only), is one component of a comprehensive MRSA colonization surveillance program. It is not intended to diagnose MRSA infection nor to guide or monitor treatment for MRSA infections. RESULT CALLED TO, READ BACK BY AND VERIFIED WITH: BETH BUONO 01/07/18 @ 0121  MLK   Performed at [facility], [address] [place], [place] [address]   Blood Culture (routine x 2)     Status: None   Collection Time: 01/07/18  7:19 AM  Result Value Ref Range Status   Specimen Description BLOOD RIGHT HAND  Final   Special Requests   Final    BOTTLES DRAWN AEROBIC AND  ANAEROBIC Blood Culture results may not be optimal due to an inadequate volume of blood received in culture bottles   Culture   Final    NO GROWTH 5 DAYS Performed at [facility], [address] [place], [place] [address]    Report Status 01/12/2018 FINAL  Final  Culture, respiratory (NON-Expectorated)     Status: None    Collection Time: 01/07/18 12:28 PM  Result Value Ref Range Status   Specimen Description   Final    TRACHEAL ASPIRATE Performed at [facility], [address] [place], [place] [address]    Special Requests   Final    Normal Performed at [facility], [address] [place], [place] [address]    Gram Stain   Final    MODERATE WBC PRESENT,BOTH PMN AND MONONUCLEAR RARE GRAM POSITIVE COCCI RARE YEAST    Culture   Final    Consistent with normal respiratory flora. Performed at [facility], [address], [place], [place] [address]    Report Status 01/09/2018 FINAL  Final  Culture, expectorated sputum-assessment     Status: None   Collection Time: 01/18/18  5:07 PM  Result Value Ref Range Status   Specimen Description ENDOTRACHEAL  Final   Special Requests NONE  Final   Sputum evaluation   Final    THIS SPECIMEN IS ACCEPTABLE FOR SPUTUM CULTURE Performed at [facility], [address], [place], [place] [address]    Report Status 01/18/2018 FINAL  Final  Culture, respiratory (NON-Expectorated)     Status: None   Collection Time: 01/18/18  5:07 PM  Result Value Ref Range Status   Specimen Description   Final    ENDOTRACHEAL Performed at [facility], [address] [place], [place] [address]    Special Requests   Final    NONE Reflexed from [id number] Performed at [facility], [address] [place], [place] [address]    Gram Stain   Final    MODERATE WBC PRESENT,BOTH PMN AND MONONUCLEAR NO SQUAMOUS EPITHELIAL CELLS SEEN RARE BUDDING YEAST SEEN    Culture   Final    RARE Consistent with normal respiratory flora. Performed at  [facility], [address], [place], [place] [address]    Report Status 01/21/2018 FINAL  Final  MRSA PCR Screening     Status: Abnormal   Collection Time: 01/18/18  8:26 PM  Result Value Ref Range Status   MRSA by PCR POSITIVE (A) NEGATIVE Final    Comment:        The  GeneXpert MRSA Assay (FDA approved for NASAL specimens only), is one component of a comprehensive MRSA colonization surveillance program. It is not intended to diagnose MRSA infection nor to guide or monitor treatment for MRSA infections. RESULT CALLED TO, READ BACK BY AND VERIFIED WITH: ANGELA BREHUN AT 2200 ON 01/18/18 RWW Performed at [facility], [address] [place], [place] [address]   CULTURE, BLOOD (ROUTINE X 2) w Reflex to ID Panel     Status: None (Preliminary result)   Collection Time: 01/31/18  8:17 AM  Result Value Ref Range Status   Specimen Description BLOOD LT HAND  Final   Special Requests   Final    BOTTLES DRAWN AEROBIC AND ANAEROBIC Blood Culture results may not be optimal due to an excessive volume of blood received in culture bottles   Culture   Final    NO GROWTH 2 DAYS Performed at  [facility], [address] [place], [place] [address]    Report Status PENDING  Incomplete  Urine Culture     Status: None   Collection Time: 01/31/18  8:30 AM  Result Value Ref Range Status   Specimen Description   Final    URINE, RANDOM Performed at [facility], [address], [place], [place] [address]    Special Requests   Final    NONE Performed at [facility], [address] [place], [place] [address]    Culture   Final    NO GROWTH Performed at [facility], [address], [place], [place] [address]    Report Status 02/01/2018 FINAL  Final  CULTURE, BLOOD (ROUTINE X 2) w Reflex to ID Panel     Status: None (Preliminary result)   Collection Time: 01/31/18  8:53 AM  Result Value Ref Range Status   Specimen Description BLOOD BLOOD RIGHT HAND  Final   Special Requests   Final    BOTTLES DRAWN AEROBIC AND ANAEROBIC Blood Culture adequate volume   Culture   Final    NO GROWTH 2 DAYS Performed at [facility], [address] [place], [place] [address]    Report Status PENDING  Incomplete  Culture,  respiratory (NON-Expectorated)     Status: None   Collection Time: 01/31/18 10:47 AM  Result Value Ref Range Status   Specimen Description   Final    TRACHEAL ASPIRATE Performed at [facility], [address] [place], [place] [address]    Special Requests   Final    NONE Performed at [facility], [address] [place], [place] [address]    Gram Stain   Final    MODERATE WBC PRESENT, PREDOMINANTLY PMN MODERATE GRAM POSITIVE COCCI MODERATE GRAM POSITIVE RODS    Culture   Final    Consistent with normal respiratory flora. Performed at [facility], [address], [place], [place] [address]    Report Status 02/02/2018 FINAL  Final    Best Practice/Protocols:    Events:  Studies: Ct Abdomen Pelvis Wo Contrast  Result Date: 01/06/2018 CLINICAL DATA:  Respiratory arrest.  Suspect pulmonary embolism. EXAM: CT CHEST, ABDOMEN AND PELVIS WITHOUT CONTRAST TECHNIQUE: Multidetector CT imaging of the chest, abdomen and pelvis was performed following the standard protocol without IV contrast. COMPARISON:  Chest radiograph January 06, 2018 FINDINGS: CT CHEST FINDINGS CARDIOVASCULAR: Heart and pericardium are unremarkable. Thoracic aorta is normal course and caliber, mild calcific atherosclerosis. MEDIASTINUM/NODES: No mediastinal mass. No lymphadenopathy by CT size criteria limited assessment without contrast. Normal appearance of thoracic esophagus though not tailored for evaluation. LUNGS/PLEURA: Tracheobronchial tree is patent, no pneumothorax. Endotracheal tube tip above the carina. Bilateral tree-in-bud infiltrates and dense consolidation with air bronchograms. RIGHT upper lobe collapse with soft tissue effacing RIGHT upper lobe bronchus. MUSCULOSKELETAL: Included soft tissues and included osseous structures appear normal. CT ABDOMEN PELVIS FINDINGS HEPATOBILIARY: Subcentimeter layering gallstones without CT findings of acute cholecystitis by noncontrast CT. Trace  perihepatic focal fat. PANCREAS: Normal. SPLEEN: Normal. ADRENALS/URINARY TRACT: Kidneys are orthotopic, demonstrating normal size and morphology. No nephrolithiasis, hydronephrosis; limited assessment for renal masses on this nonenhanced examination. The unopacified ureters are normal in course and caliber. Urinary bladder decompressed by Foley catheter. Normal adrenal glands. STOMACH/BOWEL: Nasogastric tube terminates in distal stomach. Gas distended small bowel measuring to 3.8 cm with multiple transition points in air-fluid levels. Large bowel normal in course and caliber. Normal appendix. VASCULAR/LYMPHATIC:  Mildly ectatic 2.4 cm infrarenal aorta. Mild calcific atherosclerosis. No lymphadenopathy by CT size criteria. REPRODUCTIVE: Normal. OTHER: No intraperitoneal free fluid or free air. MUSCULOSKELETAL: Non-acute. Small fat containing LEFT inguinal hernia. Mild L5-S1 degenerative disc. IMPRESSION: CT CHEST: 1. Pulmonary embolism cannot be excluded by noncontrast CT. 2. Multifocal severe pneumonia with RIGHT upper lobe collapse and soft tissue obstructing RIGHT upper lobe bronchus. Consider bronchoscopy. 3. Endotracheal tube tip above the carina. CT ABDOMEN AND PELVIS: 1. Mild small bowel ileus, potential enteritis. Nasogastric tube terminates in distal stomach. 2. Cholelithiasis without CT findings of acute cholecystitis. Aortic Atherosclerosis (ICD10-I70.0). Electronically Signed   By: Awilda Metro M.D.   On: 01/06/2018 21:44   Dg Abd 1 View  Result Date: 01/27/2018 CLINICAL DATA:  Ileus EXAM: ABDOMEN - 1 VIEW COMPARISON:  01/24/2018 FINDINGS: Diffuse gaseous distention of bowel again noted compatible with ileus, unchanged. No free air or organomegaly. No acute bony abnormality. IMPRESSION: Stable ileus pattern. Electronically Signed   By: Charlett Nose M.D.   On: 01/27/2018 07:51   Dg Abd 1 View  Result Date: 01/24/2018 CLINICAL DATA:  Constipation EXAM: ABDOMEN - 1 VIEW COMPARISON:  01/13/2018  FINDINGS: A gastrostomy tube is demonstrated over the stomach. There is mild gaseous distention of the stomach, colon, and small bowel. Prominent stool in the rectum. Changes may be due to adynamic ileus or pseudo-obstruction from impacted rectal stool. No radiopaque stones. Infiltration or atelectasis in the lung bases. IMPRESSION: Mildly gas distended stomach, small bowel, and colon with prominent stool in the rectum suggesting adynamic ileus or pseudo-obstruction due to stool impaction. Electronically Signed   By: Burman Nieves M.D.   On: 01/24/2018 23:47   Dg Abd 1 View  Result Date: 01/13/2018 CLINICAL DATA:  Orogastric tube placement EXAM: ABDOMEN - 1 VIEW COMPARISON:  CT 01/06/2018 FINDINGS: Consolidation at the left lung base. Esophageal tube tip projects over the proximal stomach. Air-filled dilated bowel in the upper abdomen. IMPRESSION: Esophageal tube tip overlies the proximal stomach. There is consolidation at the left lung base. Electronically Signed   By: Jasmine Pang M.D.   On: 01/13/2018 21:32   Ct Chest Wo Contrast  Result Date: 01/06/2018 CLINICAL DATA:  Respiratory arrest.  Suspect pulmonary embolism. EXAM: CT CHEST, ABDOMEN AND PELVIS WITHOUT CONTRAST TECHNIQUE: Multidetector CT imaging of the chest, abdomen and pelvis was performed following the standard protocol without IV contrast. COMPARISON:  Chest radiograph January 06, 2018 FINDINGS: CT CHEST FINDINGS CARDIOVASCULAR: Heart and pericardium are unremarkable. Thoracic aorta is normal course and caliber, mild calcific atherosclerosis. MEDIASTINUM/NODES: No mediastinal mass. No lymphadenopathy by CT size criteria limited assessment without contrast. Normal appearance of thoracic esophagus though not tailored for evaluation. LUNGS/PLEURA: Tracheobronchial tree is patent, no pneumothorax. Endotracheal tube tip above the carina. Bilateral tree-in-bud infiltrates and dense consolidation with air bronchograms. RIGHT upper lobe collapse with  soft tissue effacing RIGHT upper lobe bronchus. MUSCULOSKELETAL: Included soft tissues and included osseous structures appear normal. CT ABDOMEN PELVIS FINDINGS HEPATOBILIARY: Subcentimeter layering gallstones without CT findings of acute cholecystitis by noncontrast CT. Trace perihepatic focal fat. PANCREAS: Normal. SPLEEN: Normal. ADRENALS/URINARY TRACT: Kidneys are orthotopic, demonstrating normal size and morphology. No nephrolithiasis, hydronephrosis; limited assessment for renal masses on this nonenhanced examination. The unopacified ureters are normal in course and caliber. Urinary bladder decompressed by Foley catheter. Normal adrenal glands. STOMACH/BOWEL: Nasogastric tube terminates in distal stomach. Gas distended small bowel measuring to 3.8 cm with multiple transition points in air-fluid levels. Large bowel normal in course and  caliber. Normal appendix. VASCULAR/LYMPHATIC: Mildly ectatic 2.4 cm infrarenal aorta. Mild calcific atherosclerosis. No lymphadenopathy by CT size criteria. REPRODUCTIVE: Normal. OTHER: No intraperitoneal free fluid or free air. MUSCULOSKELETAL: Non-acute. Small fat containing LEFT inguinal hernia. Mild L5-S1 degenerative disc. IMPRESSION: CT CHEST: 1. Pulmonary embolism cannot be excluded by noncontrast CT. 2. Multifocal severe pneumonia with RIGHT upper lobe collapse and soft tissue obstructing RIGHT upper lobe bronchus. Consider bronchoscopy. 3. Endotracheal tube tip above the carina. CT ABDOMEN AND PELVIS: 1. Mild small bowel ileus, potential enteritis. Nasogastric tube terminates in distal stomach. 2. Cholelithiasis without CT findings of acute cholecystitis. Aortic Atherosclerosis (ICD10-I70.0). Electronically Signed   By: Awilda Metro M.D.   On: 01/06/2018 21:44   Dg Chest Port 1 View  Result Date: 02/03/2018 CLINICAL DATA:  Follow-up pneumonia EXAM: PORTABLE CHEST 1 VIEW COMPARISON:  02/02/2018 FINDINGS: Cardiac shadow is stable. Right-sided PICC line and  tracheostomy tube are noted and stable. Diffuse bilateral infiltrates are again seen and stable. No bony abnormality is noted. IMPRESSION: Stable appearance when compared with the previous exam. Electronically Signed   By: Alcide Clever M.D.   On: 02/03/2018 07:15   Dg Chest Port 1 View  Result Date: 02/02/2018 CLINICAL DATA:  Pneumonia EXAM: PORTABLE CHEST 1 VIEW COMPARISON:  Chest radiograph from one day prior. FINDINGS: Tracheostomy tube tip overlies the tracheal air column at the thoracic inlet. Right PICC terminates at the cavoatrial junction. Stable cardiomediastinal silhouette with normal heart size. No pneumothorax. No pleural effusion. Severe patchy opacity throughout both lungs is not appreciably changed. IMPRESSION: 1. Stable support structures as detailed. 2. Stable severe diffuse patchy lung opacities suggesting multilobar pneumonia and/or ARDS. Electronically Signed   By: Delbert Phenix M.D.   On: 02/02/2018 07:14   Dg Chest Port 1 View  Result Date: 02/01/2018 CLINICAL DATA:  Pneumonia EXAM: PORTABLE CHEST 1 VIEW COMPARISON:  Chest radiograph from one day prior. FINDINGS: Tracheostomy tube tip overlies the tracheal air column at the thoracic inlet. Right PICC terminates at the cavoatrial junction. Stable cardiomediastinal silhouette with normal heart size. No pneumothorax. No pleural effusion. Hazy airspace opacities throughout both lungs are slightly improved. IMPRESSION: Hazy airspace opacities throughout both lungs, slightly improved. Electronically Signed   By: Delbert Phenix M.D.   On: 02/01/2018 09:03   Dg Chest Port 1 View  Result Date: 01/31/2018 CLINICAL DATA:  PICC line placement. EXAM: PORTABLE CHEST 1 VIEW COMPARISON:  01/31/2018 and 01/30/2018. FINDINGS: 1225 hours. Right arm PICC extends to the level of the superior cavoatrial junction. Tracheostomy appears unchanged. There are stable diffuse bilateral pulmonary opacities. No pneumothorax or significant pleural effusion. The heart  size and mediastinal contours are grossly stable, partially obscured by the airspace opacities. IMPRESSION: PICC line terminates at the level of the superior cavoatrial junction. Persistent bilateral airspace opacities. Electronically Signed   By: Carey Bullocks M.D.   On: 01/31/2018 12:52   Dg Chest Port 1 View  Result Date: 01/31/2018 CLINICAL DATA:  Congestive heart failure. EXAM: PORTABLE CHEST 1 VIEW COMPARISON:  Radiograph of Jan 30, 2018. FINDINGS: Stable cardiomediastinal silhouette. Tracheostomy tube and right sided PICC line are unchanged in position. Hypoinflation of the lungs is noted. Stable bilateral lung opacities are noted concerning for edema or pneumonia. No pneumothorax or significant pleural effusion is noted. Bony thorax is unremarkable. IMPRESSION: Stable support apparatus. Hypoinflation of the lungs. Stable bilateral lung opacities concerning for edema or pneumonia. Electronically Signed   By: Lupita Raider, M.D.   On: 01/31/2018  07:13   Dg Chest Port 1 View  Result Date: 01/30/2018 CLINICAL DATA:  Congestive heart failure EXAM: PORTABLE CHEST 1 VIEW COMPARISON:  Jan 29, 2018 FINDINGS: Tracheostomy catheter tip is 5.6 cm above the carina. Central catheter tip is in the superior vena cava. No pneumothorax. There remains cardiomegaly with pulmonary venous hypertension. There is patchy interstitial and alveolar opacity, likely edema. No adenopathy. No bone lesions. IMPRESSION: Tube and catheter positions as described without pneumothorax. Pulmonary vascular congestion with interstitial and alveolar edema, stable from 1 day prior. No new opacity evident. Electronically Signed   By: [name].   On: 01/30/2018 09:07   Dg Chest Port 1 View  Result Date: 01/29/2018 CLINICAL DATA:  Shortness of breath EXAM: PORTABLE CHEST 1 VIEW COMPARISON:  January 28, 2018 FINDINGS: Tracheostomy catheter tip is 5.0 cm above the carina. Central catheter tip is in the superior vena cava. No  pneumothorax. There is diffuse airspace opacity throughout the lungs bilaterally, similar to 1 day prior. There are small pleural effusions bilaterally. No new opacity evident. Heart is enlarged with pulmonary venous hypertension. No adenopathy appreciable. No bone lesions. IMPRESSION: Tube and catheter positions as described without pneumothorax. Cardiomegaly with pulmonary venous hypertension, consistent with pulmonary vascular congestion. Small pleural effusions bilaterally. Widespread airspace opacity bilaterally may represent alveolar edema or pneumonia. Both entities may exist concurrently. A degree of underlying ARDS is also possible. The overall appearance is essentially stable compared to 1 day prior. Electronically Signed   By: [name].   On: 01/29/2018 07:45   Dg Chest Port 1 View  Result Date: 01/28/2018 CLINICAL DATA:  Pneumonia EXAM: PORTABLE CHEST 1 VIEW COMPARISON:  01/23/2018 FINDINGS: Tracheostomy and right PICC line remain in place, unchanged. Cardiomegaly. Vascular congestion and diffuse bilateral airspace opacities are again noted, unchanged. Low lung volumes. No visible effusions or acute bony abnormality. IMPRESSION: Cardiomegaly with vascular congestion and diffuse bilateral airspace disease which could reflect edema or infection. No change. Electronically Signed   By: Charlett Nose M.D.   On: 01/28/2018 09:21   Dg Chest Port 1 View  Result Date: 01/23/2018 CLINICAL DATA:  Tracheostomy placement EXAM: PORTABLE CHEST 1 VIEW COMPARISON:  January 21, 2018 FINDINGS: Tracheostomy catheter tip is 4.1 cm above the carina. Central catheter tip is at the cavoatrial junction. No pneumothorax. There is persistent moderate interstitial and patchy alveolar edema with small pleural effusions bilaterally. Heart is mildly enlarged with pulmonary venous hypertension. No adenopathy. No bone lesions. IMPRESSION: Tube and catheter positions as described without pneumothorax. Evidence of a  degree of congestive heart failure, similar to most recent prior study. Electronically Signed   By: [name].   On: 01/23/2018 12:06   Dg Chest Port 1 View  Result Date: 01/21/2018 CLINICAL DATA:  Hypoxia EXAM: PORTABLE CHEST 1 VIEW COMPARISON:  January 19, 2018 FINDINGS: Endotracheal tube tip is 3.7 cm above the carina. Nasogastric tube tip and side port are below the diaphragm. Central catheter tip is in the superior vena cava. No pneumothorax. There is moderate interstitial and patchy alveolar opacities, slightly increased. There is a small left pleural effusion. Heart is upper normal in size with pulmonary venous hypertension. No adenopathy. No bone lesions. IMPRESSION: Tube and catheter positions as described without pneumothorax. Increase interstitial and patchy alveolar pulmonary edema, likely indicative of a degree of congestive heart failure. Small left pleural effusion. Underlying pulmonary vascular congestion. Electronically Signed   By: [name].  On: 01/21/2018 07:04   Dg Chest Port 1 View  Result Date: 01/19/2018 CLINICAL DATA:  Acute respiratory failure. EXAM: PORTABLE CHEST 1 VIEW COMPARISON:  01/18/2018 FINDINGS: Endotracheal tube terminates 4 cm above the carina. Enteric tube can be followed to the GE junction but is not well visualized beyond this due to under penetration in the upper abdomen. Right PICC terminates at the cavoatrial junction. The cardiomediastinal silhouette is unchanged allowing for differences in patient rotation. Interstitial and patchy airspace opacities are again seen bilaterally with mild improvement in the right base and without significant interval change on the left. No large pleural effusion or pneumothorax is identified. IMPRESSION: Bilateral interstitial and airspace opacities, slightly improved on the right and likely reflecting pneumonia. Electronically Signed   By: Sebastian Ache M.D.   On: 01/19/2018 07:49   Dg Chest Port 1  View  Result Date: 01/18/2018 CLINICAL DATA:  Oxygen desaturation EXAM: PORTABLE CHEST 1 VIEW COMPARISON:  01/18/2018 FINDINGS: Endotracheal tube, right PICC line and NG tube remain in place, unchanged. Bilateral interstitial and alveolar opacities are again noted, unchanged. Small bilateral effusions suspected. Heart is upper limits normal in size. IMPRESSION: Bilateral interstitial and airspace opacities with small effusions. No real change. Electronically Signed   By: Charlett Nose M.D.   On: 01/18/2018 16:39   Dg Chest Port 1 View  Result Date: 01/18/2018 CLINICAL DATA:  Acute respiratory failure. EXAM: PORTABLE CHEST 1 VIEW COMPARISON:  01/16/2018 and CT chest 01/06/2018. FINDINGS: Endotracheal tube terminates 4.9 cm above the carina. Nasogastric tube is followed into the stomach. Right PICC tip projects over the SVC RA junction. Heart size within normal limits. Lungs are somewhat low in volume with diffuse mixed interstitial and airspace opacification, possibly mildly worsened from 01/16/2018. There may be right middle lobe and left lower lobe consolidation. No definite pleural fluid. IMPRESSION: Mixed interstitial and airspace opacification, possibly slightly worsened from 01/16/2018, with possible consolidation in the right middle and left lower lobes. Findings are indicative of persistent pneumonia, especially when compared with 01/06/2018. Electronically Signed   By: Leanna Battles M.D.   On: 01/18/2018 08:12   Dg Chest Port 1 View  Result Date: 01/16/2018 CLINICAL DATA:  Hypoxia and acute respiratory failure. EXAM: PORTABLE CHEST 1 VIEW COMPARISON:  None. FINDINGS: Cardiomediastinal silhouette is unchanged. An endotracheal tube with tip 3.2 cm above the carina, NG tube entering the stomach, RIGHT PICC line with tip overlying the SUPERIOR cavoatrial junction are again noted. Interstitial/airspace opacities are again noted with slight improved aeration on the LEFT. Slightly increased RIGHT UPPER  lobe atelectasis noted. There is no evidence of pneumothorax. IMPRESSION: Slightly increased RIGHT UPPER lobe atelectasis and slightly improved LEFT lung aeration. Otherwise no significant change. Electronically Signed   By: Harmon Pier M.D.   On: 01/16/2018 20:49   Dg Chest Port 1 View  Result Date: 01/13/2018 CLINICAL DATA:  Respiratory failure. EXAM: PORTABLE CHEST 1 VIEW COMPARISON:  One-view chest x-ray 01/12/2018. FINDINGS: Heart size is exaggerate by low lung volumes. Endotracheal tube is stable. Left greater than right interstitial and airspace disease is similar the prior exam. Overall lung volumes are slightly improved. IMPRESSION: 1. Stable appearance of left greater than right interstitial and airspace disease, likely a combination of edema and possible infection. 2. Support apparatus is stable. 3. Slight improvement in lung volumes. Electronically Signed   By: Marin Roberts M.D.   On: 01/13/2018 07:49   Dg Chest Port 1 View  Result Date: 01/12/2018 CLINICAL DATA:  Acute  respiratory failure with hypoxia. Severe sepsis with septic shock. On ventilator. EXAM: PORTABLE CHEST 1 VIEW COMPARISON:  01/11/2018 FINDINGS: Endotracheal tube and nasogastric tube remain in appropriate position. Heart size remains within normal limits. Low lung volumes again seen with diffuse interstitial infiltrates. No evidence of focal consolidation or significant pleural effusion. IMPRESSION: Stable low lung volumes with diffuse interstitial infiltrates/edema. Electronically Signed   By: Myles Rosenthal M.D.   On: 01/12/2018 07:37   Dg Chest Port 1 View  Result Date: 01/11/2018 CLINICAL DATA:  Respiratory failure EXAM: PORTABLE CHEST 1 VIEW COMPARISON:  January 10, 2018 FINDINGS: The ETT terminates 2.8 cm above the carina. No pneumothorax. The cardiomediastinal silhouette is stable. Pulmonary edema has decreased but persists. No other changes. IMPRESSION: 1. Stable support apparatus. 2. Persistent but decreased  pulmonary edema. 3. No other change. Electronically Signed   By: Gerome Sam III M.D   On: 01/11/2018 07:01   Dg Chest Port 1 View  Result Date: 01/10/2018 CLINICAL DATA:  Respiratory failure EXAM: PORTABLE CHEST 1 VIEW COMPARISON:  01/08/2018 FINDINGS: Cardiac shadow is stable. Endotracheal tube and nasogastric catheter are again noted in satisfactory position. Bilateral infiltrates are again identified and stable. No new focal abnormality is seen. No bony abnormality is noted. IMPRESSION: Stable bilateral infiltrates. Electronically Signed   By: Alcide Clever M.D.   On: 01/10/2018 07:24   Dg Chest Port 1 View  Result Date: 01/08/2018 CLINICAL DATA:  Respiratory failure EXAM: PORTABLE CHEST 1 VIEW COMPARISON:  Portable exam 1003 hours compared to 01/07/2018 FINDINGS: Tip of endotracheal tube projects 4.4 cm above carina. Nasogastric tube extends into abdomen. Enlargement of cardiac silhouette. Diffuse BILATERAL airspace infiltrates increased in upper lobes since previous exam. No gross pleural effusion or pneumothorax. IMPRESSION: Increased BILATERAL airspace infiltrates. Electronically Signed   By: Ulyses Southward M.D.   On: 01/08/2018 10:33   Dg Chest Port 1 View  Result Date: 01/07/2018 CLINICAL DATA:  Acute respiratory failure.  Hypoxia. EXAM: PORTABLE CHEST 1 VIEW COMPARISON:  CT 6 hours prior, radiographs 8 hours prior. FINDINGS: Endotracheal tube 3.9 cm from the carina. Enteric tube in place with tip below the diaphragm, not included in the field of view. Progressive bilateral lung opacities, greatest in the lung bases with air bronchograms. Heart appears normal in size. No large pleural effusion. No pneumothorax. IMPRESSION: Progressive bilateral lung opacities, greatest in the lung bases, may reflect pneumonia or aspiration. Pulmonary edema is felt less likely. Electronically Signed   By: Rubye Oaks M.D.   On: 01/07/2018 04:01   Dg Chest Portable 1 View  Result Date: 01/06/2018 CLINICAL  DATA:  ETT placement EXAM: PORTABLE CHEST 1 VIEW COMPARISON:  None. FINDINGS: Endotracheal tube tip is about 2.4 cm superior to the carina. Low lung volumes. No pleural effusion or pneumothorax. Heart size within normal limits. Streaky left greater than right hilar opacity. IMPRESSION: 1. Endotracheal tube tip about 2.4 cm superior to the carina 2. Low lung volumes. Streaky left greater than right perihilar opacity of uncertain chronicity. Electronically Signed   By: Jasmine Pang M.D.   On: 01/06/2018 19:35   [place] Ekg Site Rite  Result Date: 01/13/2018 If Site Rite image not attached, placement could not be confirmed due to current cardiac rhythm.   Consults: Treatment Team:  Marcina Millard, MD Mick Sell, MD   Subjective:    Overnight no issues reported by nursing   Objective:  Vital signs for last 24 hours: Temp:  [97.2 F (36.2 C)-97.8 F (  36.6 C)] 97.8 F (36.6 C) (05/06 0400) Pulse Rate:  [55-61] 60 (05/06 0811) Resp:  [16-20] 16 (05/06 0600) BP: (107-127)/(59-82) 110/61 (05/06 0600) SpO2:  [89 %-100 %] 99 % (05/06 0600) FiO2 (%):  [0 %-60 %] 60 % (05/06 0400)  Hemodynamic parameters for last 24 hours:    Intake/Output from previous day: 05/05 0701 - 05/06 0700 In: 3009 [I.V.:299; NG/GT:780; IV Piggyback:1930] Out: 2575 [Urine:2575]  Intake/Output this shift: No intake/output data recorded.  Vent settings for last 24 hours: Vent Mode: PRVC FiO2 (%):  [0 %-60 %] 60 % Set Rate:  [14 bmp] 14 bmp Vt Set:  [500 mL] 500 mL PEEP:  [8 cmH20] 8 cmH20 Plateau Pressure:  [18 cmH20-28 cmH20] 18 cmH20  Physical Exam:  Patient is on mechanical ventilation through tracheostomy. Appears to track and minimally respond Vital signs: Please see the above listed vital signs HEENT: Tracheostomy in place with mechanical ventilation, trachea is midline, mild increased work of breathing, no noted jugular venous distention Cardiovascular: Regular rate and rhythm, coarse  expiratory rhonchi noted, inspiratory crackles appreciated right greater than left Abdominal: PEG tube noted to be in place, mild distention, bowel sounds appreciated Extremity: Edema noted Neurologic: Patient moves extremities, appears to track  Assessment/Plan:   Patient 57 year old gentleman with traumatic brain injury with acute respiratory failure, severe bilateral pneumonia, suspected aspiration, sepsis. Presently being followed by infectious disease, on meropenem, vancomycin and Eraxis. X-ray appears to have worsened.we'll give an additional dose of diuretics today  History of atrial fibrillation. On amiodarone And systemic anticoagulation  History of dilated cardiomyopathy with ejection fraction 20%  Anemia. No clear evidence of active bleeding. Hemoglobin is 7.6  Leukocytosis. White count is 12.1, trending downward    Critical Care Total Time: 40 minutes  Shuna Tabor 02/03/2018  *Care during the described time interval was provided by me and/or other providers on the critical care team.  I have reviewed this patient's available data, including medical history, events of note, physical examination and test results as part of my evaluation.

## 2018-02-03 NOTE — Plan of Care (Signed)
  Problem: Clinical Measurements: Goal: Will remain free from infection Outcome: Progressing Note:  No fever   Problem: Nutrition: Goal: Adequate nutrition will be maintained Outcome: Progressing   Problem: Elimination: Goal: Will not experience complications related to urinary retention Outcome: Progressing   Problem: Safety: Goal: Ability to remain free from injury will improve Outcome: Progressing   Problem: Skin Integrity: Goal: Risk for impaired skin integrity will decrease Outcome: Progressing   Problem: Education: Goal: Knowledge of General Education information will improve Outcome: Not Met (add Reason)   Problem: Health Behavior/Discharge Planning: Goal: Ability to manage health-related needs will improve Outcome: Not Met (add Reason)   Problem: Activity: Goal: Risk for activity intolerance will decrease Outcome: Not Met (add Reason)   Problem: Coping: Goal: Level of anxiety will decrease Outcome: Not Met (add Reason)   Problem: Elimination: Goal: Will not experience complications related to bowel motility Outcome: Not Met (add Reason) Note:  Several loose Bm, rectal tube placed

## 2018-02-03 NOTE — Progress Notes (Signed)
Patient ID: Tommy Mathews, male   DOB: 01-Jul-1961, 57 y.o.   MRN: [id number]  [facility] PROGRESS NOTE  Tommy Mathews UOR:[id number] DOB: [birth year] DOA: 01/06/2018 PCP: Isaias Cowman, MD  HPI/Subjective: Continues to remain on ventilator  Objective: Vitals:   02/03/18 1500 02/03/18 1538  BP: 140/80   Pulse:    Resp: (!) 24   Temp:    SpO2:  95%    Filed Weights   01/19/18 0347 01/20/18 1200 01/26/18 0418  Weight: (!) 136.7 kg (301 lb 5.9 oz) (!) 139.5 kg (307 lb 8.7 oz) 133.2 kg (293 lb 10.4 oz)    ROS: Review of Systems  Unable to perform ROS: Acuity of condition   Exam: Physical Exam  Constitutional: He appears lethargic.  HENT:  Nose: No mucosal edema.  Eyes: Pupils are equal, round, and reactive to light. Conjunctivae and lids are normal.  Neck: Carotid bruit is not present. No thyroid mass present.  Cardiovascular: Regular rhythm, S1 normal, S2 normal and normal heart sounds.  Respiratory: He has decreased breath sounds in the right middle field, the right lower field, the left middle field and the left lower field. He has no wheezes. He has rhonchi in the right middle field, the right lower field, the left middle field and the left lower field. He has no rales.  GI: Soft. Bowel sounds are normal. There is tenderness.  Genitourinary:  Genitourinary Comments: 4+ scrotal edema, penile edema  Musculoskeletal:       Right ankle: He exhibits swelling.       Left ankle: He exhibits swelling.  Neurological: He appears lethargic.  Skin: Skin is warm. No rash noted. Nails show no clubbing.  Psychiatric: His affect is blunt.      Data Reviewed: Basic Metabolic Panel: Recent Labs  Lab 01/28/18 0401  01/29/18 1238 01/30/18 2761 01/31/18 0818 02/01/18 0411 02/01/18 2007 02/02/18 0433 02/03/18 0441  NA 140   < > 138 138 136 139  --  139 139  K 3.5   < > 3.4* 3.7 3.7 3.1* 3.2* 3.6 4.1  CL 96*   < > 92* 92* 91* 92*  --  98* 97*  CO2 40*   < > 39* 40*  38* 42*  --  37* 38*  GLUCOSE 191*   < > 169* 130* 107* 86  --  125* 185*  BUN 22*   < > '14 14 18 ' 22*  --  17 15  CREATININE 0.76   < > 0.70 0.71 0.89 0.81  --  0.62 0.71  CALCIUM 8.1*   < > 8.2* 8.1* 8.2* 7.8*  --  7.5* 7.9*  MG 1.7  --  1.4* 1.8 1.4* 2.3  --  1.8  --   PHOS 3.3  --   --  3.2 3.2 4.2  --  3.9  --    < > = values in this interval not displayed.   Liver Function Tests: Recent Labs  Lab 01/30/18 0458 02/01/18 0411  AST 33 34  ALT 21 23  ALKPHOS 68 81  BILITOT 0.3 0.8  PROT 5.8* 5.6*  ALBUMIN 2.0* 1.8*   CBC: Recent Labs  Lab 01/29/18 0448 01/30/18 1454 01/31/18 0818 02/01/18 0411 02/02/18 0433 02/03/18 0441  WBC 11.1*  --  12.6* 17.5* 13.1* 12.1*  NEUTROABS 7.7*  --  12.4* 13.7* 9.5* 9.2*  HGB 7.9* 7.1* 9.3* 7.5* 7.3* 7.6*  HCT 24.4* 22.3* 27.2* 23.0* 22.2* 23.4*  MCV 84.3  --  83.7 84.7  84.8 85.0  PLT 210  --  273 236 273 370   BNP (last 3 results) Recent Labs    01/06/18 1944 01/16/18 2332  BNP 37.0 402.0*     CBG: Recent Labs  Lab 02/02/18 1953 02/02/18 2354 02/03/18 0411 02/03/18 0812 02/03/18 1222  GLUCAP 122* 162* 164* 167* 109*     Studies: Dg Chest Port 1 View  Result Date: 02/03/2018 CLINICAL DATA:  Follow-up pneumonia EXAM: PORTABLE CHEST 1 VIEW COMPARISON:  02/02/2018 FINDINGS: Cardiac shadow is stable. Right-sided PICC line and tracheostomy tube are noted and stable. Diffuse bilateral infiltrates are again seen and stable. No bony abnormality is noted. IMPRESSION: Stable appearance when compared with the previous exam. Electronically Signed   By: Inez Catalina M.D.   On: 02/03/2018 07:15   Dg Chest Port 1 View  Result Date: 02/02/2018 CLINICAL DATA:  Pneumonia EXAM: PORTABLE CHEST 1 VIEW COMPARISON:  Chest radiograph from one day prior. FINDINGS: Tracheostomy tube tip overlies the tracheal air column at the thoracic inlet. Right PICC terminates at the cavoatrial junction. Stable cardiomediastinal silhouette with normal heart  size. No pneumothorax. No pleural effusion. Severe patchy opacity throughout both lungs is not appreciably changed. IMPRESSION: 1. Stable support structures as detailed. 2. Stable severe diffuse patchy lung opacities suggesting multilobar pneumonia and/or ARDS. Electronically Signed   By: Ilona Sorrel M.D.   On: 02/02/2018 07:14    Scheduled Meds: . amiodarone  400 mg Per Tube BID  . atorvastatin  80 mg Per Tube q1800  . budesonide (PULMICORT) nebulizer solution  0.5 mg Nebulization BID  . chlorhexidine gluconate (MEDLINE KIT)  15 mL Mouth Rinse BID  . clonazePAM  0.5 mg Per Tube BID  . digoxin  0.25 mg Per Tube Daily  . docusate  100 mg Per Tube BID  . fentaNYL  75 mcg Transdermal Q72H  . FLUoxetine  40 mg Per Tube Daily  . free water  200 mL Per Tube Q8H  . furosemide  40 mg Intravenous Daily  . hydrochlorothiazide  25 mg Oral Daily  . insulin aspart  0-20 Units Subcutaneous Q4H  . insulin glargine  20 Units Subcutaneous QHS  . ipratropium-albuterol  3 mL Nebulization Q8H  . lisinopril  5 mg Per Tube Daily  . mouth rinse  15 mL Mouth Rinse 10 times per day  . metoCLOPramide  10 mg Oral Q12H  . metoprolol tartrate  25 mg Per Tube Q6H  . nystatin  5 mL Oral QID  . pantoprazole sodium  40 mg Per Tube Daily  . Valproate Sodium  1,000 mg Per Tube BID   Continuous Infusions: . sodium chloride    . anidulafungin Stopped (02/02/18 2030)  . dexmedetomidine (PRECEDEX) IV infusion 0.799 mcg/kg/hr (02/03/18 1500)  . feeding supplement (GLUCERNA 1.5 CAL) 65 mL/hr at 02/03/18 1500  . meropenem (MERREM) IV Stopped (02/03/18 1505)  . vancomycin      Assessment/Plan:  1. Ventilator associated pneumonia continue on meropenem and vancomycin.  2. Acute hypoxic respiratory failure.   Continue ventilator 3. Atrial fibrillation with rapid ventricular response, episodes of SVT.  Patient on amiodarone, digoxin and metoprolol.   4. Acute on chronic systolic congestive heart failure with anasarca and  scrotal edema.  On IV Lasix daily 5. Acute encephalopathy.   history of traumatic brain injury. 6. Previous septic shock and pneumonia treated already. 7. Gastrointestinal bleed. on PPI.  Endoscopy negative.   Xarelto stopped and patient was given another unit of blood.  Hemoglobin  is trending down continue to monitor 8. Acute kidney injury resolved. 9. Type 2 diabetes mellitus on glargine insulin and sliding scale 10. Nutrition on tube feeding per intensivist 11. Depression on Prozac and Depakene. 12. Thrush on nystatin status post treatment 13. Hyperlipidemia unspecified on atorvastatin  Overall prognosis poor patient has not shown much improvement  Code Status:     Code Status Orders  (From admission, onward)        Start     Ordered   01/06/18 2227  Full code  Continuous     01/06/18 2226    Code Status History    This patient has a current code status but no historical code status.     Family Communication:   mother and patient's sister at the bedside Disposition Plan:  will need long-term placement on a facility that can handle a ventilator  Consultants:  Critical care specialist  Cardiology  Time spent: 28 minutes  Bessie Boyte [facility]

## 2018-02-03 NOTE — Clinical Social Work Note (Signed)
Patient currently on precedex drip and will need to be off the precedex drip prior to going to a vent/snf. York Spaniel MSW,LCSW [phone]

## 2018-02-03 NOTE — Progress Notes (Signed)
Pharmacy Electrolyte Monitoring Consult:  Pharmacy consulted to assist in monitoring and replacing electrolytes in this 57 y.o. male admitted on 01/06/2018   Patient requiring mechanical ventilation and has PEG tube.   Labs:  Sodium (mmol/L)  Date Value  02/03/2018 139   Potassium (mmol/L)  Date Value  02/03/2018 4.1   Magnesium (mg/dL)  Date Value  16/07/9603 1.8   Phosphorus (mg/dL)  Date Value  54/06/8118 3.9   Calcium (mg/dL)  Date Value  14/78/2956 7.9 (L)   Albumin (g/dL)  Date Value  21/30/8657 1.8 (L)    Plan: No replacement warranted.   Will recheck electrolytes with am labs.   Pharmacy will continue to monitor and adjust per consult.   Simpson,Michael L,  02/03/2018 9:56 PM

## 2018-02-03 NOTE — Progress Notes (Signed)
[place] INFECTIOUS DISEASE PROGRESS NOTE Date of Admission:  01/06/2018     ID: Tommy Mathews is a 57 y.o. male with fever Principal Problem:   Severe sepsis with septic shock ([place]) Active Problems:   GI bleed   Acute respiratory failure with hypoxia (HCC)   Multifocal pneumonia   UTI (urinary tract infection)   AKI (acute kidney injury) ([place])   Ileus (HCC)   Diabetes ([place])   Pressure injury of skin   Subjective: His fevers have resolved.   ROS  Unable to obtain Medications:  Antibiotics Given (last 72 hours)    Date/Time Action Medication Dose Rate   01/31/18 2031 New Bag/Given   vancomycin (VANCOCIN) 1,250 mg in sodium chloride 0.9 % 250 mL IVPB 1,250 mg 166.7 mL/hr   01/31/18 2126 New Bag/Given   meropenem (MERREM) 1 g in sodium chloride 0.9 % 100 mL IVPB 1 g 200 mL/hr   02/01/18 0411 New Bag/Given   vancomycin (VANCOCIN) 1,250 mg in sodium chloride 0.9 % 250 mL IVPB 1,250 mg 166.7 mL/hr   02/01/18 0550 New Bag/Given   meropenem (MERREM) 1 g in sodium chloride 0.9 % 100 mL IVPB 1 g 200 mL/hr   02/01/18 1300 New Bag/Given   vancomycin (VANCOCIN) 1,250 mg in sodium chloride 0.9 % 250 mL IVPB 1,250 mg 166.7 mL/hr   02/01/18 1600 New Bag/Given   meropenem (MERREM) 1 g in sodium chloride 0.9 % 100 mL IVPB 1 g 200 mL/hr   02/01/18 2115 New Bag/Given   vancomycin (VANCOCIN) 1,500 mg in sodium chloride 0.9 % 500 mL IVPB 1,500 mg 250 mL/hr   02/01/18 2242 New Bag/Given   meropenem (MERREM) 1 g in sodium chloride 0.9 % 100 mL IVPB 1 g 200 mL/hr   02/02/18 0444 New Bag/Given   vancomycin (VANCOCIN) 1,500 mg in sodium chloride 0.9 % 500 mL IVPB 1,500 mg 250 mL/hr   02/02/18 0550 New Bag/Given   meropenem (MERREM) 1 g in sodium chloride 0.9 % 100 mL IVPB 1 g 200 mL/hr   02/02/18 1304 New Bag/Given   vancomycin (VANCOCIN) 1,500 mg in sodium chloride 0.9 % 500 mL IVPB 1,500 mg 250 mL/hr   02/02/18 1430 New Bag/Given   meropenem (MERREM) 1 g in sodium chloride 0.9 % 100 mL  IVPB 1 g 200 mL/hr   02/02/18 2033 New Bag/Given   vancomycin (VANCOCIN) 1,500 mg in sodium chloride 0.9 % 500 mL IVPB 1,500 mg 250 mL/hr   02/02/18 2313 New Bag/Given   meropenem (MERREM) 1 g in sodium chloride 0.9 % 100 mL IVPB 1 g 200 mL/hr   02/03/18 0513 New Bag/Given   meropenem (MERREM) 1 g in sodium chloride 0.9 % 100 mL IVPB 1 g 200 mL/hr   02/03/18 1432 New Bag/Given   meropenem (MERREM) 1 g in sodium chloride 0.9 % 100 mL IVPB 1 g 200 mL/hr     . amiodarone  400 mg Per Tube BID  . atorvastatin  80 mg Per Tube q1800  . budesonide (PULMICORT) nebulizer solution  0.5 mg Nebulization BID  . chlorhexidine gluconate (MEDLINE KIT)  15 mL Mouth Rinse BID  . clonazePAM  0.5 mg Per Tube BID  . digoxin  0.25 mg Per Tube Daily  . docusate  100 mg Per Tube BID  . fentaNYL  75 mcg Transdermal Q72H  . FLUoxetine  40 mg Per Tube Daily  . free water  200 mL Per Tube Q8H  . furosemide  40 mg Intravenous Daily  .  hydrochlorothiazide  25 mg Oral Daily  . insulin aspart  0-20 Units Subcutaneous Q4H  . insulin glargine  20 Units Subcutaneous QHS  . ipratropium-albuterol  3 mL Nebulization Q8H  . lisinopril  5 mg Per Tube Daily  . mouth rinse  15 mL Mouth Rinse 10 times per day  . metoCLOPramide  10 mg Oral Q12H  . metoprolol tartrate  25 mg Per Tube Q6H  . nystatin  5 mL Oral QID  . pantoprazole sodium  40 mg Per Tube Daily  . Valproate Sodium  1,000 mg Per Tube BID    Objective: Vital signs in last 24 hours: Temp:  [97.4 F (36.3 C)-97.9 F (36.6 C)] 97.9 F (36.6 C) (05/06 1200) Pulse Rate:  [55-68] 65 (05/06 1300) Resp:  [16-35] 24 (05/06 1500) BP: (107-155)/(59-113) 140/80 (05/06 1500) SpO2:  [89 %-100 %] 95 % (05/06 1538) FiO2 (%):  [40 %-60 %] 40 % (05/06 1538) Constitutional: lethargic, HENT: trach in place, anicteric Mouth/Throat: Oropharynx is clear and moist.  Cardiovascular: Normal rate, regular rhythm and normal heart sounds.  Pulmonary/Chest: rhonchi bil Abdominal:  Soft. Bowel sounds are normal. He exhibits no distension. There is no tenderness.  PEG Site wnl Lymphadenopathy: He has no cervical adenopathy.  Neurological: lethargic Skin: Skin is warm and dry. No rash noted. No erythema.  Psychiatric: trached PICC RUE WNL    Lab Results Recent Labs    02/02/18 0433 02/03/18 0441  WBC 13.1* 12.1*  HGB 7.3* 7.6*  HCT 22.2* 23.4*  NA 139 139  K 3.6 4.1  CL 98* 97*  CO2 37* 38*  BUN 17 15  CREATININE 0.62 0.71    Microbiology: _0 @ Studies/Results: Dg Chest Port 1 View  Result Date: 02/03/2018 CLINICAL DATA:  Follow-up pneumonia EXAM: PORTABLE CHEST 1 VIEW COMPARISON:  02/02/2018 FINDINGS: Cardiac shadow is stable. Right-sided PICC line and tracheostomy tube are noted and stable. Diffuse bilateral infiltrates are again seen and stable. No bony abnormality is noted. IMPRESSION: Stable appearance when compared with the previous exam. Electronically Signed   By: Inez Catalina M.D.   On: 02/03/2018 07:15   Dg Chest Port 1 View  Result Date: 02/02/2018 CLINICAL DATA:  Pneumonia EXAM: PORTABLE CHEST 1 VIEW COMPARISON:  Chest radiograph from one day prior. FINDINGS: Tracheostomy tube tip overlies the tracheal air column at the thoracic inlet. Right PICC terminates at the cavoatrial junction. Stable cardiomediastinal silhouette with normal heart size. No pneumothorax. No pleural effusion. Severe patchy opacity throughout both lungs is not appreciably changed. IMPRESSION: 1. Stable support structures as detailed. 2. Stable severe diffuse patchy lung opacities suggesting multilobar pneumonia and/or ARDS. Electronically Signed   By: Ilona Sorrel M.D.   On: 02/02/2018 07:14    Assessment/Plan: Tommy Mathews is a 57 y.o. 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 He has had a prolonged admission and is now trached and has PEG. He was treated with IV abx until  4/15 then again 4/20-21. Was then off of abx until developed fever 5/2 with wbc up some to 12. CXR however did not show any new opacities but has increasing resp requirements and increased sputum.  PICC line RUE changed. Prior sputum cx with Nml flora 4/20. Prior bcx neg. FU BCX and sputum pending.  5/6- defervesced, bcx neg, sputum cx negative. WBC down 17->12. Day 4 of vanco and mero. Imaging shows no change in severe patchy opacities bilaterally.  Recommendations Can dc antifungal coverage since cultures negative.   Cont vanco and mero for 8 days total - stop date 5/10 PICC line has been changed. Follow clinically    Thank you very much for the consult. Will follow with you.  Leonel Ramsay   02/03/2018, 4:31 PM

## 2018-02-03 NOTE — Progress Notes (Signed)
Pharmacy Antibiotic Note  Tommy Mathews is a 57 y.o. male being treated for possible ventilator assiociated pneumonia.  Pharmacy has been consulted for meropenem and vancomycin dosing. Patient also initiated on anidulafungin on 5/3. Patient currently requiring mechanical ventilation and has a PEG. PICC line changed on 5/3.   Patient previously treated for aspiration pneumonia this admission.   Plan: 5/4 2007 Vanc Trough subtherapeutic at 13. Will increase dose to Vancomycin 1500 every 8 hours.Calulated trough at Css is 16. Trough level prior to 4th dose.   5/6 AM vanc level 28. Dose changed to PRN for placeholder. Recheck level in ~12 hours.  New: Ke: 0.092   T1/2: 7.53   Vd: 93  Continue Meropenem 1g IV Q8hr.    Height:  (177.8 cm) Weight: 293 lb 10.4 oz (133.2 kg) IBW/kg (Calculated) : 73  Adjusted Body Weight: 98kg   Temp (24hrs), Avg:97.1 F (36.2 C), Min:95.9 F (35.5 C), Max:97.8 F (36.6 C)  Recent Labs  Lab 01/28/18 0401 01/29/18 0448  01/30/18 0458 01/31/18 0817 01/31/18 0818 01/31/18 1049 02/01/18 0411 02/01/18 2007 02/02/18 0433 02/03/18 0441  WBC 11.7* 11.1*  --   --   --  12.6*  --  17.5*  --  13.1*  --   CREATININE 0.76 0.69   < > 0.71  --  0.89  --  0.81  --  0.62 0.71  LATICACIDVEN  --   --   --   --  2.2*  --  1.3  --   --   --   --   VANCOTROUGH  --   --   --   --   --   --   --   --  13*  --  28*   < > = values in this interval not displayed.    Estimated Creatinine Clearance: 143.3 mL/min (by C-G formula based on SCr of 0.71 mg/dL).    No Known Allergies  Antimicrobials this admission: Vancomycin 5/3 >>  Meropenem 5/3 >>  Anidulafungin 5/3 >>  Microbiology results: 5/3 BCx: NGTD 5/3 UCx: NG  5/3 Tracheal Aspirate: mod GPC, GPR  4/20 MRSA PCR: positive  Thank you for allowing pharmacy to be a part of this patient's care.  Erich Montane, PharmD, BCPS Clinical Pharmacist 02/03/2018 5:24 AM

## 2018-02-03 NOTE — Progress Notes (Signed)
Pharmacy Antibiotic Note  Tommy Mathews is a 57 y.o. male being treated for possible ventilator assiociated pneumonia.  Pharmacy has been consulted for meropenem and vancomycin dosing. Patient also initiated on anidulafungin on 5/3. Patient currently requiring mechanical ventilation and has a PEG. PICC line changed on 5/3.   Patient previously treated for aspiration pneumonia this admission.   Plan: 5/4 2007 Vanc Trough subtherapeutic at 13. Will increase dose to Vancomycin 1500 every 8 hours.Calulated trough at Css is 16. Trough level prior to 4th dose.   5/6 AM vanc level 28. Dose changed to PRN for placeholder. Recheck level in ~12 hours.  5/6 @ 1945 VR 12 mcg/ml. Ke= 0.0605 h-1. T1/2= 12 h. Will initiate vancomycin 1500 mg iv q 12 hours. Next trough with the 4th dose.    Continue Meropenem 1g IV Q8hr.    Height:  (177.8 cm) Weight: 293 lb 10.4 oz (133.2 kg) IBW/kg (Calculated) : 73  Adjusted Body Weight: 98kg   Temp (24hrs), Avg:97.8 F (36.6 C), Min:97.4 F (36.3 C), Max:98.1 F (36.7 C)  Recent Labs  Lab 01/29/18 0448  01/30/18 0458 01/31/18 0817 01/31/18 0818 01/31/18 1049 02/01/18 0411 02/01/18 2007 02/02/18 0433 02/03/18 0441 02/03/18 1848  WBC 11.1*  --   --   --  12.6*  --  17.5*  --  13.1* 12.1*  --   CREATININE 0.69   < > 0.71  --  0.89  --  0.81  --  0.62 0.71  --   LATICACIDVEN  --   --   --  2.2*  --  1.3  --   --   --   --   --   VANCOTROUGH  --   --   --   --   --   --   --  13*  --  28*  --   VANCORANDOM  --   --   --   --   --   --   --   --   --   --  12   < > = values in this interval not displayed.    Estimated Creatinine Clearance: 143.3 mL/min (by C-G formula based on SCr of 0.71 mg/dL).    No Known Allergies  Antimicrobials this admission: Vancomycin 5/3 >>  Meropenem 5/3 >>  Anidulafungin 5/3 >>  Microbiology results: 5/3 BCx: NGTD 5/3 UCx: NG  5/3 Tracheal Aspirate: mod GPC, GPR  4/20 MRSA PCR: positive  Thank you for  allowing pharmacy to be a part of this patient's care.  Valentina Gu, PharmD, BCPS Clinical Pharmacist 02/03/2018 7:45 PM

## 2018-02-04 ENCOUNTER — Inpatient Hospital Stay: Payer: Medicaid Other

## 2018-02-04 LAB — CBC WITH DIFFERENTIAL/PLATELET
BASOS ABS: 0.2 10*3/uL — AB (ref 0–0.1)
BASOS PCT: 1 %
Band Neutrophils: 7 %
Blasts: 0 %
EOS PCT: 1 %
Eosinophils Absolute: 0.2 10*3/uL (ref 0–0.7)
HCT: 25.2 % — ABNORMAL LOW (ref 40.0–52.0)
Hemoglobin: 8 g/dL — ABNORMAL LOW (ref 13.0–18.0)
Lymphocytes Relative: 14 %
Lymphs Abs: 2.4 10*3/uL (ref 1.0–3.6)
MCH: 27.4 pg (ref 26.0–34.0)
MCHC: 31.8 g/dL — ABNORMAL LOW (ref 32.0–36.0)
MCV: 85.9 fL (ref 80.0–100.0)
METAMYELOCYTES PCT: 1 %
MONO ABS: 1.9 10*3/uL — AB (ref 0.2–1.0)
MYELOCYTES: 1 %
Monocytes Relative: 11 %
Neutro Abs: 12.7 10*3/uL — ABNORMAL HIGH (ref 1.4–6.5)
Neutrophils Relative %: 64 %
Other: 0 %
PLATELETS: 452 10*3/uL — AB (ref 150–440)
Promyelocytes Relative: 0 %
RBC: 2.93 MIL/uL — ABNORMAL LOW (ref 4.40–5.90)
RDW: 18.1 % — AB (ref 11.5–14.5)
SMEAR REVIEW: ADEQUATE
WBC: 17.4 10*3/uL — ABNORMAL HIGH (ref 3.8–10.6)
nRBC: 0 /100 WBC

## 2018-02-04 LAB — GLUCOSE, CAPILLARY
GLUCOSE-CAPILLARY: 115 mg/dL — AB (ref 65–99)
Glucose-Capillary: 128 mg/dL — ABNORMAL HIGH (ref 65–99)
Glucose-Capillary: 131 mg/dL — ABNORMAL HIGH (ref 65–99)
Glucose-Capillary: 159 mg/dL — ABNORMAL HIGH (ref 65–99)
Glucose-Capillary: 160 mg/dL — ABNORMAL HIGH (ref 65–99)
Glucose-Capillary: 176 mg/dL — ABNORMAL HIGH (ref 65–99)
Glucose-Capillary: 93 mg/dL (ref 65–99)

## 2018-02-04 LAB — BASIC METABOLIC PANEL
ANION GAP: 4 — AB (ref 5–15)
BUN: 14 mg/dL (ref 6–20)
CALCIUM: 8.1 mg/dL — AB (ref 8.9–10.3)
CO2: 36 mmol/L — ABNORMAL HIGH (ref 22–32)
Chloride: 97 mmol/L — ABNORMAL LOW (ref 101–111)
Creatinine, Ser: 0.68 mg/dL (ref 0.61–1.24)
GFR calc Af Amer: >60 mL/min (ref >60)
Glucose, Bld: 174 mg/dL — ABNORMAL HIGH (ref 65–99)
POTASSIUM: 3.9 mmol/L (ref 3.5–5.1)
SODIUM: 137 mmol/L (ref 135–145)

## 2018-02-04 MED ORDER — FENTANYL 100 MCG/HR TD PT72
100.0000 ug | MEDICATED_PATCH | TRANSDERMAL | Status: DC
  Administered 2018-02-04 – 2018-02-07 (×2): 100 ug via TRANSDERMAL
  Filled 2018-02-04 (×2): qty 1

## 2018-02-04 MED ORDER — JUVEN PO PACK
1.0000 | PACK | Freq: Two times a day (BID) | ORAL | Status: DC
  Administered 2018-02-04 – 2018-03-16 (×78): 1

## 2018-02-04 MED ORDER — GABAPENTIN 250 MG/5ML PO SOLN
600.0000 mg | Freq: Three times a day (TID) | ORAL | Status: DC
  Administered 2018-02-04 – 2018-02-11 (×20): 600 mg
  Filled 2018-02-04 (×29): qty 12

## 2018-02-04 NOTE — Progress Notes (Signed)
Patient ID: Tommy Mathews, male   DOB: 06/08/1961, 57 y.o.   MRN: [id number]  [facility] PROGRESS NOTE  Tommy Mathews OFB:[id number] DOB: [birth year] DOA: 01/06/2018 PCP: Tommy Cowman, MD  HPI/Subjective: Patient continues to be on ventilator  Objective: Vitals:   02/04/18 1400 02/04/18 1500  BP: 131/85 (!) 128/55  Pulse: 69 74  Resp: (!) 28 (!) 23  Temp: 98.1 F (36.7 C) 100 F (37.8 C)  SpO2: 94% 100%    Filed Weights   01/19/18 0347 01/20/18 1200 01/26/18 0418  Weight: (!) 136.7 kg (301 lb 5.9 oz) (!) 139.5 kg (307 lb 8.7 oz) 133.2 kg (293 lb 10.4 oz)    ROS: Review of Systems  Unable to perform ROS: Acuity of condition   Exam: Physical Exam  Constitutional: He appears lethargic.  HENT:  Nose: No mucosal edema.  Eyes: Pupils are equal, round, and reactive to light. Conjunctivae and lids are normal.  Neck: Carotid bruit is not present. No thyroid mass present.  Cardiovascular: Regular rhythm, S1 normal, S2 normal and normal heart sounds.  Respiratory: He has decreased breath sounds in the right middle field, the right lower field, the left middle field and the left lower field. He has no wheezes. He has rhonchi in the right middle field, the right lower field, the left middle field and the left lower field. He has no rales.  GI: Soft. Bowel sounds are normal. There is tenderness.  Genitourinary:  Genitourinary Comments: 4+ scrotal edema, penile edema  Musculoskeletal:       Right ankle: He exhibits swelling.       Left ankle: He exhibits swelling.  Neurological: He appears lethargic.  Skin: Skin is warm. No rash noted. Nails show no clubbing.  Psychiatric: His affect is blunt.      Data Reviewed: Basic Metabolic Panel: Recent Labs  Lab 01/29/18 1238 01/30/18 0458 01/31/18 0818 02/01/18 0411 02/01/18 2007 02/02/18 0433 02/03/18 0441 02/04/18 0835  NA 138 138 136 139  --  139 139 137  K 3.4* 3.7 3.7 3.1* 3.2* 3.6 4.1 3.9  CL 92* 92* 91* 92*   --  98* 97* 97*  CO2 39* 40* 38* 42*  --  37* 38* 36*  GLUCOSE 169* 130* 107* 86  --  125* 185* 174*  BUN '14 14 18 ' 22*  --  '17 15 14  ' CREATININE 0.70 0.71 0.89 0.81  --  0.62 0.71 0.68  CALCIUM 8.2* 8.1* 8.2* 7.8*  --  7.5* 7.9* 8.1*  MG 1.4* 1.8 1.4* 2.3  --  1.8  --   --   PHOS  --  3.2 3.2 4.2  --  3.9  --   --    Liver Function Tests: Recent Labs  Lab 01/30/18 0458 02/01/18 0411  AST 33 34  ALT 21 23  ALKPHOS 68 81  BILITOT 0.3 0.8  PROT 5.8* 5.6*  ALBUMIN 2.0* 1.8*   CBC: Recent Labs  Lab 01/31/18 0818 02/01/18 0411 02/02/18 0433 02/03/18 0441 02/04/18 0835  WBC 12.6* 17.5* 13.1* 12.1* 17.4*  NEUTROABS 12.4* 13.7* 9.5* 9.2* 12.7*  HGB 9.3* 7.5* 7.3* 7.6* 8.0*  HCT 27.2* 23.0* 22.2* 23.4* 25.2*  MCV 83.7 84.7 84.8 85.0 85.9  PLT 273 236 273 370 452*   BNP (last 3 results) Recent Labs    01/06/18 1944 01/16/18 2332  BNP 37.0 402.0*     CBG: Recent Labs  Lab 02/03/18 1919 02/04/18 0009 02/04/18 0503 02/04/18 0739 02/04/18 1139  GLUCAP 126*  93 159* 160* 115*     Studies: Dg Chest Port 1 View  Result Date: 02/04/2018 CLINICAL DATA:  Acute respiratory failure, pulmonary edema, sepsis, hypertension, diabetes, current smoker. EXAM: PORTABLE CHEST 1 VIEW COMPARISON:  Portable chest x-ray of Feb 03, 2018 FINDINGS: Diffuse airspace opacities have worsened bilaterally. The cardiac silhouette is mildly enlarged. The pulmonary vascularity is indistinct. There is calcification in the wall of the aortic arch. There may be pleural fluid layering posteriorly on the left. The tracheostomy tube tip projects between the clavicular heads. The right sided PICC line tip projects over the midportion of the SVC. IMPRESSION: Worsening airspace opacities bilaterally consistent with progressive pneumonia or pulmonary edema. Thoracic aortic atherosclerosis. Electronically Signed   By: David  Martinique M.D.   On: 02/04/2018 10:28   Dg Chest Port 1 View  Result Date:  02/03/2018 CLINICAL DATA:  Follow-up pneumonia EXAM: PORTABLE CHEST 1 VIEW COMPARISON:  02/02/2018 FINDINGS: Cardiac shadow is stable. Right-sided PICC line and tracheostomy tube are noted and stable. Diffuse bilateral infiltrates are again seen and stable. No bony abnormality is noted. IMPRESSION: Stable appearance when compared with the previous exam. Electronically Signed   By: Inez Catalina M.D.   On: 02/03/2018 07:15    Scheduled Meds: . amiodarone  400 mg Per Tube BID  . atorvastatin  80 mg Per Tube q1800  . budesonide (PULMICORT) nebulizer solution  0.5 mg Nebulization BID  . chlorhexidine gluconate (MEDLINE KIT)  15 mL Mouth Rinse BID  . clonazePAM  1 mg Per Tube BID  . digoxin  0.25 mg Per Tube Daily  . fentaNYL  100 mcg Transdermal Q72H  . FLUoxetine  40 mg Per Tube Daily  . free water  200 mL Per Tube Q8H  . furosemide  40 mg Intravenous Daily  . gabapentin  600 mg Per Tube Q8H  . hydrochlorothiazide  25 mg Oral Daily  . insulin aspart  0-20 Units Subcutaneous Q4H  . insulin glargine  20 Units Subcutaneous QHS  . ipratropium-albuterol  3 mL Nebulization Q8H  . lisinopril  5 mg Per Tube Daily  . mouth rinse  15 mL Mouth Rinse 10 times per day  . metoCLOPramide  10 mg Oral Q12H  . metoprolol tartrate  25 mg Per Tube Q6H  . nutrition supplement (JUVEN)  1 packet Per Tube BID  . nystatin  5 mL Oral QID  . pantoprazole sodium  40 mg Per Tube Daily  . Valproate Sodium  1,000 mg Per Tube BID   Continuous Infusions: . sodium chloride    . dexmedetomidine (PRECEDEX) IV infusion 0.6 mcg/kg/hr (02/04/18 1452)  . feeding supplement (GLUCERNA 1.5 CAL) 1,000 mL (02/04/18 1452)  . meropenem (MERREM) IV Stopped (02/04/18 1507)  . vancomycin Stopped (02/04/18 1048)    Assessment/Plan:  1. Ventilator associated pneumonia continue on meropenem and vancomycin.  2. Acute hypoxic respiratory failure.   Continue ventilator 3. Atrial fibrillation with rapid ventricular response, episodes of  SVT.  Patient on amiodarone, digoxin and metoprolol.   4. Acute on chronic systolic congestive heart failure with anasarca and scrotal edema.  On IV Lasix daily 5. Acute encephalopathy.   history of traumatic brain injury. 6. Previous septic shock and pneumonia treated already. 7. Gastrointestinal bleed. on PPI.  Endoscopy negative.   Xarelto stopped and patient was given another unit of blood.  Hemoglobin is trending down continue to monitor 8. Acute kidney injury resolved. 9. Type 2 diabetes mellitus on glargine insulin and sliding scale 10. Nutrition  on tube feeding per intensivist 11. Depression on Prozac and Depakene. 12. Thrush on nystatin status post treatment 13. Hyperlipidemia unspecified on atorvastatin  Overall prognosis poor patient has not shown much improvement  Code Status:     Code Status Orders  (From admission, onward)        Start     Ordered   01/06/18 2227  Full code  Continuous     01/06/18 2226    Code Status History    This patient has a current code status but no historical code status.     Family Communication:   mother and patient's sister at the bedside Disposition Plan:  will need long-term placement on a facility that can handle a ventilator  Consultants:  Critical care specialist  Cardiology  Time spent: 28 minutes  Anniebelle Devore [facility]

## 2018-02-04 NOTE — Progress Notes (Signed)
Nutrition Follow-up  DOCUMENTATION CODES:   Obesity unspecified  INTERVENTION:  Continue Glucerna 1.5 Cal at 65 mL/hr (1560 mL goal daily volume). Provides 2340 kcal, 129 grams of protein, 25 grams of fiber, 1186 mL H2O daily.  With current free water flush of 200 mL Q8hrs patient is receiving a total of 1786 mL H2O daily including water in tube feeding.  Provide Juven BID per tube, each supplement provides 80 kcal, 14 grams amino acids, and vitamins/minerals needed for wound healing.  NUTRITION DIAGNOSIS:   Inadequate oral intake related to inability to eat as evidenced by NPO status.  Ongoing - addressing with TF regimen.  GOAL:   Provide needs based on ASPEN/SCCM guidelines  Met with TF regimen.  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  Patient remains on mechanical ventilation through tracheostomy tube.   Access: 20 Fr G-tube with C-clamp placed 4/23; placement verified by relook endoscopy; 4.5 cm at external bumper  TF: pt has been tolerating Glucerna 1.5 at 65 mL/hr; at time of RD assessment Glucerna 1.2 was hanging; discussed with RN  Patient is currently intubated on ventilator support Ve: 16.5 L/min Temp (24hrs), Avg:98.6 F (37 C), Min:97.5 F (36.4 C), Max:100.4 F (38 C)  Propofol: N/A  Medications reviewed and include: amiodarone, clonazepam, fentanyl patch, free water flush 200 mL Q8hrs, Lasix 40 mg daily IV, hydrochlorothiazide 25 mg daily, Novolog 0-20 units Q4hrs, Lantus 20 units daily,  Reglan 10 mg Q12hrs, pantoprazole, valproate sodium, Precedex gtt, meropenem, vancomycin.  Labs reviewed: CBG 93-160, Chloride 97, CO2 36, Anion gap 4.  I/O: 4100 mL UOP yesterday (1.3 mL/kg/hr)  No weight since 4/28 to trend.  Discussed with RN and on rounds.  Diet Order:   Diet Order    None      EDUCATION NEEDS:   Not appropriate for education at this time  Skin:  Skin Assessment: Skin Integrity Issues: Skin Integrity Issues:: Stage II, Other (Comment) Stage II: coccyx Other: open blister right thigh; MSAD to scrotum  Last BM:  02/04/2018 - large type 7  Height:   Ht Readings from Last 1 Encounters:  01/07/18 '5\' 10"'  (1.778 m)    Weight:   Wt Readings from Last 1 Encounters:  01/26/18 293 lb 10.4 oz (133.2 kg)    Ideal Body Weight:  75.5 kg  BMI:  Body mass index is 42.13 kg/m.  Estimated Nutritional Needs:   Kcal:  2295 (PSU 2003b w/ MSJ 1919, Ve 11.9, Tmax 37.7)  Protein:  113-135 grams (1.5-1.8 grams/kg IBW)  Fluid:  1.8 L/day  Willey Blade, MS, RD, LDN Office: [phone] Pager: [phone] After Hours/Weekend Pager: [phone]

## 2018-02-04 NOTE — Progress Notes (Signed)
ID E note Fever and wbc up some Cont current abx and recheck bcx

## 2018-02-04 NOTE — Progress Notes (Signed)
Follow up - Critical Care Medicine Note  Patient Details:    Tommy Mathews is an 57 y.o. male.with a past medical history remarkable for diabetes, gastroesophageal reflux disease, hyperlipidemia, hypertension, traumatic brain injury, status post tracheostomy and PEG tube placement, is in the intensive care unit for sepsis, severe pneumonia, suspected aspiration  Lines, Airways, Drains: PICC Triple Lumen 01/31/18 PICC Right 46 cm 0 cm (Active)  Indication for Insertion or Continuance of Line Administration of hyperosmolar/irritating solutions (i.e. TPN, Vancomycin, etc.) 02/03/2018  8:00 AM  Exposed Catheter (cm) 0 cm 01/31/2018 12:00 PM  Site Assessment Clean;Dry;Intact 02/02/2018  8:50 PM  Lumen #1 Status Infusing 02/02/2018  8:50 PM  Lumen #2 Status Infusing 02/02/2018  8:50 PM  Lumen #3 Status Saline locked 02/02/2018  8:50 PM  Dressing Type Transparent 02/02/2018  8:50 PM  Dressing Status Clean;Dry;Intact 02/02/2018  8:50 PM  Line Care Connections checked and tightened 02/02/2018  8:50 PM  Line Adjustment (NICU/IV Team Only) No 02/01/2018 12:39 PM  Dressing Change Due 02/07/18 02/02/2018 11:06 AM     Gastrostomy/Enterostomy PEG-jejunostomy (Active)  Surrounding Skin Dry;Intact 02/03/2018  3:42 AM  Tube Status Patent 02/03/2018  3:42 AM  Drainage Appearance None 02/03/2018  3:42 AM  Dressing Status Clean;Dry;Intact 02/03/2018  3:42 AM  Dressing Intervention New dressing 01/31/2018  6:30 PM  Dressing Type Split gauze 02/03/2018  3:42 AM  Dressing Change Due 02/06/18 02/02/2018 11:30 PM  G Port Intake (mL) 80 ml 01/30/2018  5:30 PM     Urethral Catheter (Active)  Indication for Insertion or Continuance of Catheter Bladder outlet obstruction / other urologic reason 02/03/2018  3:42 AM  Site Assessment Intact;Clean 02/03/2018  3:42 AM  Catheter Maintenance Bag below level of bladder;No dependent loops;Seal intact;Bag emptied prior to transport 02/03/2018  3:42 AM  Collection Container Standard drainage bag 02/03/2018  3:42 AM   Securement Method Leg strap 02/03/2018  3:42 AM  Urinary Catheter Interventions Unclamped 02/03/2018  3:42 AM  Input (mL) 0 mL 01/15/2018 11:47 PM  Output (mL) 675 mL 02/03/2018  6:00 AM    Anti-infectives:  Anti-infectives (From admission, onward)   Start     Dose/Rate Route Frequency Ordered Stop   02/03/18 2100  vancomycin (VANCOCIN) 1,500 mg in sodium chloride 0.9 % 500 mL IVPB     1,500 mg 250 mL/hr over 120 Minutes Intravenous Every 12 hours 02/03/18 1950     02/03/18 0522  vancomycin (VANCOCIN) 1,500 mg in sodium chloride 0.9 % 500 mL IVPB  Status:  Discontinued     1,500 mg 250 mL/hr over 120 Minutes Intravenous As needed 02/03/18 0523 02/03/18 1950   02/01/18 2100  vancomycin (VANCOCIN) 1,500 mg in sodium chloride 0.9 % 500 mL IVPB  Status:  Discontinued     1,500 mg 250 mL/hr over 120 Minutes Intravenous Every 8 hours 02/01/18 2038 02/03/18 0523   02/01/18 1800  anidulafungin (ERAXIS) 100 mg in sodium chloride 0.9 % 100 mL IVPB     100 mg 78 mL/hr over 100 Minutes Intravenous Every 24 hours 01/31/18 1734     02/01/18 1500  anidulafungin (ERAXIS) 100 mg in sodium chloride 0.9 % 100 mL IVPB  Status:  Discontinued     100 mg 78 mL/hr over 100 Minutes Intravenous Every 24 hours 01/31/18 1540 01/31/18 1734   01/31/18 2000  vancomycin (VANCOCIN) 1,250 mg in sodium chloride 0.9 % 250 mL IVPB  Status:  Discontinued     1,250 mg 166.7 mL/hr over 90 Minutes Intravenous Every  8 hours 01/31/18 1734 02/01/18 2038   01/31/18 1900  vancomycin (VANCOCIN) 1,250 mg in sodium chloride 0.9 % 250 mL IVPB  Status:  Discontinued     1,250 mg 166.7 mL/hr over 90 Minutes Intravenous Every 8 hours 01/31/18 1728 01/31/18 1734   01/31/18 1545  anidulafungin (ERAXIS) 200 mg in sodium chloride 0.9 % 200 mL IVPB     200 mg 78 mL/hr over 200 Minutes Intravenous  Once 01/31/18 1540 01/31/18 2107   01/31/18 0930  vancomycin (VANCOCIN) IVPB 1000 mg/200 mL premix     1,000 mg 200 mL/hr over 60 Minutes  Intravenous STAT 01/31/18 0922 01/31/18 1545   01/31/18 0800  meropenem (MERREM) 1 g in sodium chloride 0.9 % 100 mL IVPB     1 g 200 mL/hr over 30 Minutes Intravenous Every 8 hours 01/31/18 0748     01/21/18 0600  ceFAZolin (ANCEF) 3 g in dextrose 5 % 50 mL IVPB     3 g 130 mL/hr over 30 Minutes Intravenous  Once 01/20/18 1829 01/21/18 0654   01/20/18 0600  vancomycin (VANCOCIN) 1,250 mg in sodium chloride 0.9 % 250 mL IVPB  Status:  Discontinued     1,250 mg 166.7 mL/hr over 90 Minutes Intravenous Every 12 hours 01/20/18 0153 01/20/18 0905   01/19/18 0200  vancomycin (VANCOCIN) 1,250 mg in sodium chloride 0.9 % 250 mL IVPB  Status:  Discontinued     1,250 mg 166.7 mL/hr over 90 Minutes Intravenous Every 8 hours 01/18/18 1953 01/20/18 0153   01/18/18 2000  vancomycin (VANCOCIN) 1,250 mg in sodium chloride 0.9 % 250 mL IVPB     1,250 mg 166.7 mL/hr over 90 Minutes Intravenous  Once 01/18/18 1953 01/18/18 2246   01/18/18 2000  ceFEPIme (MAXIPIME) 2 g in sodium chloride 0.9 % 100 mL IVPB  Status:  Discontinued     2 g 200 mL/hr over 30 Minutes Intravenous Every 8 hours 01/18/18 1953 01/20/18 0905   01/12/18 0900  vancomycin (VANCOCIN) IVPB 1000 mg/200 mL premix  Status:  Discontinued     1,000 mg 200 mL/hr over 60 Minutes Intravenous Every 24 hours 01/11/18 1329 01/13/18 1548   01/10/18 1000  ceFEPIme (MAXIPIME) 2 g in sodium chloride 0.9 % 100 mL IVPB     2 g 200 mL/hr over 30 Minutes Intravenous Every 12 hours 01/10/18 0919 01/13/18 2129   01/09/18 2200  vancomycin (VANCOCIN) IVPB 750 mg/150 ml premix  Status:  Discontinued     750 mg 150 mL/hr over 60 Minutes Intravenous Every 12 hours 01/09/18 1952 01/11/18 1329   01/08/18 1503  Ampicillin-Sulbactam (UNASYN) 3 g in sodium chloride 0.9 % 100 mL IVPB  Status:  Discontinued     3 g 200 mL/hr over 30 Minutes Intravenous Every 6 hours 01/08/18 0931 01/08/18 1024   01/08/18 1230  ceFEPIme (MAXIPIME) 2 g in sodium chloride 0.9 % 100 mL IVPB   Status:  Discontinued     2 g 200 mL/hr over 30 Minutes Intravenous Every 12 hours 01/08/18 1151 01/10/18 0919   01/08/18 1100  metroNIDAZOLE (FLAGYL) IVPB 500 mg  Status:  Discontinued     500 mg 100 mL/hr over 60 Minutes Intravenous Every 8 hours 01/08/18 1024 01/13/18 1547   01/08/18 0800  Ampicillin-Sulbactam (UNASYN) 3 g in sodium chloride 0.9 % 100 mL IVPB  Status:  Discontinued     3 g 200 mL/hr over 30 Minutes Intravenous Every 12 hours 01/07/18 1906 01/08/18 0931   01/08/18 0600  vancomycin (VANCOCIN) IVPB 1000 mg/200 mL premix  Status:  Discontinued     1,000 mg 200 mL/hr over 60 Minutes Intravenous Every 12 hours 01/08/18 0545 01/09/18 1952   01/07/18 2100  ceFEPIme (MAXIPIME) 2 g in sodium chloride 0.9 % 100 mL IVPB  Status:  Discontinued     2 g 200 mL/hr over 30 Minutes Intravenous Every 24 hours 01/06/18 2349 01/07/18 1008   01/07/18 1800  Ampicillin-Sulbactam (UNASYN) 3 g in sodium chloride 0.9 % 100 mL IVPB  Status:  Discontinued     3 g 200 mL/hr over 30 Minutes Intravenous Every 6 hours 01/07/18 1436 01/07/18 1906   01/07/18 1015  Ampicillin-Sulbactam (UNASYN) 3 g in sodium chloride 0.9 % 100 mL IVPB  Status:  Discontinued     3 g 200 mL/hr over 30 Minutes Intravenous Every 12 hours 01/07/18 1008 01/07/18 1436   01/07/18 0600  vancomycin (VANCOCIN) 1,250 mg in sodium chloride 0.9 % 250 mL IVPB  Status:  Discontinued     1,250 mg 166.7 mL/hr over 90 Minutes Intravenous Every 24 hours 01/06/18 2349 01/07/18 1917   01/06/18 2100  ceFEPIme (MAXIPIME) 2 g in sodium chloride 0.9 % 100 mL IVPB     2 g 200 mL/hr over 30 Minutes Intravenous  Once 01/06/18 2056 01/06/18 2320   01/06/18 2100  vancomycin (VANCOCIN) IVPB 1000 mg/200 mL premix     1,000 mg 200 mL/hr over 60 Minutes Intravenous  Once 01/06/18 2056 01/07/18 0024      Microbiology: Results for orders placed or performed during the hospital encounter of 01/06/18  Blood Culture (routine x 2)     Status: None    Collection Time: 01/06/18  7:44 PM  Result Value Ref Range Status   Specimen Description BLOOD BLOOD LEFT WRIST  Final   Special Requests   Final    BOTTLES DRAWN AEROBIC AND ANAEROBIC Blood Culture adequate volume   Culture   Final    NO GROWTH 5 DAYS Performed at [facility], [address] [place], [place] [address]    Report Status 01/11/2018 FINAL  Final  Urine culture     Status: Abnormal   Collection Time: 01/06/18  7:44 PM  Result Value Ref Range Status   Specimen Description   Final    URINE, RANDOM Performed at [facility], [address] [place], [place] [address]    Special Requests   Final    NONE Performed at [facility], [address] [place], [place] [address]    Culture (A)  Final    10,000 COLONIES/mL STAPHYLOCOCCUS SPECIES (COAGULASE NEGATIVE) CALL MICROBIOLOGY LAB IF SENSITIVITIES ARE REQUIRED. Performed at [facility], [address] [place], [place] [address]    Report Status 01/08/2018 FINAL  Final  MRSA PCR Screening     Status: Abnormal   Collection Time: 01/06/18 11:51 PM  Result Value Ref Range Status   MRSA by PCR POSITIVE (A) NEGATIVE Final    Comment:        The GeneXpert MRSA Assay (FDA approved for NASAL specimens only), is one component of a comprehensive MRSA colonization surveillance program. It is not intended to diagnose MRSA infection nor to guide or monitor treatment for MRSA infections. RESULT CALLED TO, READ BACK BY AND VERIFIED WITH: BETH BUONO 01/07/18 @ 0121  MLK   Performed at [facility], [address], [place], [place] [address]   Blood Culture (routine x 2)  Status: None   Collection Time: 01/07/18  7:19 AM  Result Value Ref Range Status   Specimen Description BLOOD RIGHT HAND  Final   Special Requests   Final    BOTTLES DRAWN AEROBIC AND ANAEROBIC Blood Culture results may not be optimal due to an inadequate volume of blood received in culture bottles   Culture    Final    NO GROWTH 5 DAYS Performed at [facility], [address], [place], [place] [address]    Report Status 01/12/2018 FINAL  Final  Culture, respiratory (NON-Expectorated)     Status: None   Collection Time: 01/07/18 12:28 PM  Result Value Ref Range Status   Specimen Description   Final    TRACHEAL ASPIRATE Performed at [facility], [address] [place], [place] [address]    Special Requests   Final    Normal Performed at [facility], [address] [place], [place] [address]    Gram Stain   Final    MODERATE WBC PRESENT,BOTH PMN AND MONONUCLEAR RARE GRAM POSITIVE COCCI RARE YEAST    Culture   Final    Consistent with normal respiratory flora. Performed at [facility], [address] [place], [place] [address]    Report Status 01/09/2018 FINAL  Final  Culture, expectorated sputum-assessment     Status: None   Collection Time: 01/18/18  5:07 PM  Result Value Ref Range Status   Specimen Description ENDOTRACHEAL  Final   Special Requests NONE  Final   Sputum evaluation   Final    THIS SPECIMEN IS ACCEPTABLE FOR SPUTUM CULTURE Performed at [facility], [address], [place], [place] [address]    Report Status 01/18/2018 FINAL  Final  Culture, respiratory (NON-Expectorated)     Status: None   Collection Time: 01/18/18  5:07 PM  Result Value Ref Range Status   Specimen Description   Final    ENDOTRACHEAL Performed at [facility], [address], [place], [place] [address]    Special Requests   Final    NONE Reflexed from [id number] Performed at [facility], [address] [place], [place] [address]    Gram Stain   Final    MODERATE WBC PRESENT,BOTH PMN AND MONONUCLEAR NO SQUAMOUS EPITHELIAL CELLS SEEN RARE BUDDING YEAST SEEN    Culture   Final    RARE Consistent with normal respiratory flora. Performed at [facility], [address], [place], [place] [address]    Report Status  01/21/2018 FINAL  Final  MRSA PCR Screening     Status: Abnormal   Collection Time: 01/18/18  8:26 PM  Result Value Ref Range Status   MRSA by PCR POSITIVE (A) NEGATIVE Final    Comment:        The GeneXpert MRSA Assay (FDA approved for NASAL specimens only), is one component of a comprehensive MRSA colonization surveillance program. It is not intended to diagnose MRSA infection nor to guide or monitor treatment for MRSA infections. RESULT CALLED TO, READ BACK BY AND VERIFIED WITH: ANGELA BREHUN AT 2200 ON 01/18/18 RWW Performed at [facility], [address] [place], [place] [address]   CULTURE, BLOOD (ROUTINE X 2) w Reflex to ID Panel     Status: None (Preliminary result)   Collection Time: 01/31/18  8:17 AM  Result Value Ref Range Status   Specimen Description BLOOD LT HAND  Final   Special Requests   Final  BOTTLES DRAWN AEROBIC AND ANAEROBIC Blood Culture results may not be optimal due to an excessive volume of blood received in culture bottles   Culture   Final    NO GROWTH 4 DAYS Performed at [facility], [address] [place], [place] [address]    Report Status PENDING  Incomplete  Urine Culture     Status: None   Collection Time: 01/31/18  8:30 AM  Result Value Ref Range Status   Specimen Description   Final    URINE, RANDOM Performed at [facility], [address], [place], [place] [address]    Special Requests   Final    NONE Performed at [facility], [address], [place], [place] [address]    Culture   Final    NO GROWTH Performed at [facility], [address] [place], [place] [address]    Report Status 02/01/2018 FINAL  Final  CULTURE, BLOOD (ROUTINE X 2) w Reflex to ID Panel     Status: None (Preliminary result)   Collection Time: 01/31/18  8:53 AM  Result Value Ref Range Status   Specimen Description BLOOD BLOOD RIGHT HAND  Final   Special Requests   Final    BOTTLES DRAWN AEROBIC AND  ANAEROBIC Blood Culture adequate volume   Culture   Final    NO GROWTH 4 DAYS Performed at [facility], [address] [place] [address], [place], [place] [address]    Report Status PENDING  Incomplete  Culture, respiratory (NON-Expectorated)     Status: None   Collection Time: 01/31/18 10:47 AM  Result Value Ref Range Status   Specimen Description   Final    TRACHEAL ASPIRATE Performed at [facility] [address] [place], [place] [address]    Special Requests   Final    NONE Performed at [facility], [address] [place], [place] [address]    Gram Stain   Final    MODERATE WBC PRESENT, PREDOMINANTLY PMN MODERATE GRAM POSITIVE COCCI MODERATE GRAM POSITIVE RODS    Culture   Final    Consistent with normal respiratory flora. Performed at [facility], [address], [place], [place] [address]    Report Status 02/02/2018 FINAL  Final    Best Practice/Protocols:    Events:  Studies: Ct Abdomen Pelvis Wo Contrast  Result Date: 01/06/2018 CLINICAL DATA:  Respiratory arrest.  Suspect pulmonary embolism. EXAM: CT CHEST, ABDOMEN AND PELVIS WITHOUT CONTRAST TECHNIQUE: Multidetector CT imaging of the chest, abdomen and pelvis was performed following the standard protocol without IV contrast. COMPARISON:  Chest radiograph January 06, 2018 FINDINGS: CT CHEST FINDINGS CARDIOVASCULAR: Heart and pericardium are unremarkable. Thoracic aorta is normal course and caliber, mild calcific atherosclerosis. MEDIASTINUM/NODES: No mediastinal mass. No lymphadenopathy by CT size criteria limited assessment without contrast. Normal appearance of thoracic esophagus though not tailored for evaluation. LUNGS/PLEURA: Tracheobronchial tree is patent, no pneumothorax. Endotracheal tube tip above the carina. Bilateral tree-in-bud infiltrates and dense consolidation with air bronchograms. RIGHT upper lobe collapse with soft tissue effacing RIGHT upper lobe bronchus. MUSCULOSKELETAL:  Included soft tissues and included osseous structures appear normal. CT ABDOMEN PELVIS FINDINGS HEPATOBILIARY: Subcentimeter layering gallstones without CT findings of acute cholecystitis by noncontrast CT. Trace perihepatic focal fat. PANCREAS: Normal. SPLEEN: Normal. ADRENALS/URINARY TRACT: Kidneys are orthotopic, demonstrating normal size and morphology. No nephrolithiasis, hydronephrosis; limited assessment for renal masses on this nonenhanced examination. The unopacified ureters are normal in course and caliber. Urinary bladder decompressed  by Foley catheter. Normal adrenal glands. STOMACH/BOWEL: Nasogastric tube terminates in distal stomach. Gas distended small bowel measuring to 3.8 cm with multiple transition points in air-fluid levels. Large bowel normal in course and caliber. Normal appendix. VASCULAR/LYMPHATIC: Mildly ectatic 2.4 cm infrarenal aorta. Mild calcific atherosclerosis. No lymphadenopathy by CT size criteria. REPRODUCTIVE: Normal. OTHER: No intraperitoneal free fluid or free air. MUSCULOSKELETAL: Non-acute. Small fat containing LEFT inguinal hernia. Mild L5-S1 degenerative disc. IMPRESSION: CT CHEST: 1. Pulmonary embolism cannot be excluded by noncontrast CT. 2. Multifocal severe pneumonia with RIGHT upper lobe collapse and soft tissue obstructing RIGHT upper lobe bronchus. Consider bronchoscopy. 3. Endotracheal tube tip above the carina. CT ABDOMEN AND PELVIS: 1. Mild small bowel ileus, potential enteritis. Nasogastric tube terminates in distal stomach. 2. Cholelithiasis without CT findings of acute cholecystitis. Aortic Atherosclerosis (ICD10-I70.0). Electronically Signed   By: Awilda Metro M.D.   On: 01/06/2018 21:44   Dg Abd 1 View  Result Date: 01/27/2018 CLINICAL DATA:  Ileus EXAM: ABDOMEN - 1 VIEW COMPARISON:  01/24/2018 FINDINGS: Diffuse gaseous distention of bowel again noted compatible with ileus, unchanged. No free air or organomegaly. No acute bony abnormality. IMPRESSION:  Stable ileus pattern. Electronically Signed   By: Charlett Nose M.D.   On: 01/27/2018 07:51   Dg Abd 1 View  Result Date: 01/24/2018 CLINICAL DATA:  Constipation EXAM: ABDOMEN - 1 VIEW COMPARISON:  01/13/2018 FINDINGS: A gastrostomy tube is demonstrated over the stomach. There is mild gaseous distention of the stomach, colon, and small bowel. Prominent stool in the rectum. Changes may be due to adynamic ileus or pseudo-obstruction from impacted rectal stool. No radiopaque stones. Infiltration or atelectasis in the lung bases. IMPRESSION: Mildly gas distended stomach, small bowel, and colon with prominent stool in the rectum suggesting adynamic ileus or pseudo-obstruction due to stool impaction. Electronically Signed   By: Burman Nieves M.D.   On: 01/24/2018 23:47   Dg Abd 1 View  Result Date: 01/13/2018 CLINICAL DATA:  Orogastric tube placement EXAM: ABDOMEN - 1 VIEW COMPARISON:  CT 01/06/2018 FINDINGS: Consolidation at the left lung base. Esophageal tube tip projects over the proximal stomach. Air-filled dilated bowel in the upper abdomen. IMPRESSION: Esophageal tube tip overlies the proximal stomach. There is consolidation at the left lung base. Electronically Signed   By: Jasmine Pang M.D.   On: 01/13/2018 21:32   Ct Chest Wo Contrast  Result Date: 01/06/2018 CLINICAL DATA:  Respiratory arrest.  Suspect pulmonary embolism. EXAM: CT CHEST, ABDOMEN AND PELVIS WITHOUT CONTRAST TECHNIQUE: Multidetector CT imaging of the chest, abdomen and pelvis was performed following the standard protocol without IV contrast. COMPARISON:  Chest radiograph January 06, 2018 FINDINGS: CT CHEST FINDINGS CARDIOVASCULAR: Heart and pericardium are unremarkable. Thoracic aorta is normal course and caliber, mild calcific atherosclerosis. MEDIASTINUM/NODES: No mediastinal mass. No lymphadenopathy by CT size criteria limited assessment without contrast. Normal appearance of thoracic esophagus though not tailored for evaluation.  LUNGS/PLEURA: Tracheobronchial tree is patent, no pneumothorax. Endotracheal tube tip above the carina. Bilateral tree-in-bud infiltrates and dense consolidation with air bronchograms. RIGHT upper lobe collapse with soft tissue effacing RIGHT upper lobe bronchus. MUSCULOSKELETAL: Included soft tissues and included osseous structures appear normal. CT ABDOMEN PELVIS FINDINGS HEPATOBILIARY: Subcentimeter layering gallstones without CT findings of acute cholecystitis by noncontrast CT. Trace perihepatic focal fat. PANCREAS: Normal. SPLEEN: Normal. ADRENALS/URINARY TRACT: Kidneys are orthotopic, demonstrating normal size and morphology. No nephrolithiasis, hydronephrosis; limited assessment for renal masses on this nonenhanced examination. The unopacified ureters are normal in course and  caliber. Urinary bladder decompressed by Foley catheter. Normal adrenal glands. STOMACH/BOWEL: Nasogastric tube terminates in distal stomach. Gas distended small bowel measuring to 3.8 cm with multiple transition points in air-fluid levels. Large bowel normal in course and caliber. Normal appendix. VASCULAR/LYMPHATIC: Mildly ectatic 2.4 cm infrarenal aorta. Mild calcific atherosclerosis. No lymphadenopathy by CT size criteria. REPRODUCTIVE: Normal. OTHER: No intraperitoneal free fluid or free air. MUSCULOSKELETAL: Non-acute. Small fat containing LEFT inguinal hernia. Mild L5-S1 degenerative disc. IMPRESSION: CT CHEST: 1. Pulmonary embolism cannot be excluded by noncontrast CT. 2. Multifocal severe pneumonia with RIGHT upper lobe collapse and soft tissue obstructing RIGHT upper lobe bronchus. Consider bronchoscopy. 3. Endotracheal tube tip above the carina. CT ABDOMEN AND PELVIS: 1. Mild small bowel ileus, potential enteritis. Nasogastric tube terminates in distal stomach. 2. Cholelithiasis without CT findings of acute cholecystitis. Aortic Atherosclerosis (ICD10-I70.0). Electronically Signed   By: Awilda Metro M.D.   On: 01/06/2018  21:44   Dg Chest Port 1 View  Result Date: 02/03/2018 CLINICAL DATA:  Follow-up pneumonia EXAM: PORTABLE CHEST 1 VIEW COMPARISON:  02/02/2018 FINDINGS: Cardiac shadow is stable. Right-sided PICC line and tracheostomy tube are noted and stable. Diffuse bilateral infiltrates are again seen and stable. No bony abnormality is noted. IMPRESSION: Stable appearance when compared with the previous exam. Electronically Signed   By: Alcide Clever M.D.   On: 02/03/2018 07:15   Dg Chest Port 1 View  Result Date: 02/02/2018 CLINICAL DATA:  Pneumonia EXAM: PORTABLE CHEST 1 VIEW COMPARISON:  Chest radiograph from one day prior. FINDINGS: Tracheostomy tube tip overlies the tracheal air column at the thoracic inlet. Right PICC terminates at the cavoatrial junction. Stable cardiomediastinal silhouette with normal heart size. No pneumothorax. No pleural effusion. Severe patchy opacity throughout both lungs is not appreciably changed. IMPRESSION: 1. Stable support structures as detailed. 2. Stable severe diffuse patchy lung opacities suggesting multilobar pneumonia and/or ARDS. Electronically Signed   By: Delbert Phenix M.D.   On: 02/02/2018 07:14   Dg Chest Port 1 View  Result Date: 02/01/2018 CLINICAL DATA:  Pneumonia EXAM: PORTABLE CHEST 1 VIEW COMPARISON:  Chest radiograph from one day prior. FINDINGS: Tracheostomy tube tip overlies the tracheal air column at the thoracic inlet. Right PICC terminates at the cavoatrial junction. Stable cardiomediastinal silhouette with normal heart size. No pneumothorax. No pleural effusion. Hazy airspace opacities throughout both lungs are slightly improved. IMPRESSION: Hazy airspace opacities throughout both lungs, slightly improved. Electronically Signed   By: Delbert Phenix M.D.   On: 02/01/2018 09:03   Dg Chest Port 1 View  Result Date: 01/31/2018 CLINICAL DATA:  PICC line placement. EXAM: PORTABLE CHEST 1 VIEW COMPARISON:  01/31/2018 and 01/30/2018. FINDINGS: 1225 hours. Right arm PICC  extends to the level of the superior cavoatrial junction. Tracheostomy appears unchanged. There are stable diffuse bilateral pulmonary opacities. No pneumothorax or significant pleural effusion. The heart size and mediastinal contours are grossly stable, partially obscured by the airspace opacities. IMPRESSION: PICC line terminates at the level of the superior cavoatrial junction. Persistent bilateral airspace opacities. Electronically Signed   By: Carey Bullocks M.D.   On: 01/31/2018 12:52   Dg Chest Port 1 View  Result Date: 01/31/2018 CLINICAL DATA:  Congestive heart failure. EXAM: PORTABLE CHEST 1 VIEW COMPARISON:  Radiograph of Jan 30, 2018. FINDINGS: Stable cardiomediastinal silhouette. Tracheostomy tube and right sided PICC line are unchanged in position. Hypoinflation of the lungs is noted. Stable bilateral lung opacities are noted concerning for edema or pneumonia. No pneumothorax or significant pleural  effusion is noted. Bony thorax is unremarkable. IMPRESSION: Stable support apparatus. Hypoinflation of the lungs. Stable bilateral lung opacities concerning for edema or pneumonia. Electronically Signed   By: Lupita Raider, M.D.   On: 01/31/2018 07:13   Dg Chest Port 1 View  Result Date: 01/30/2018 CLINICAL DATA:  Congestive heart failure EXAM: PORTABLE CHEST 1 VIEW COMPARISON:  Jan 29, 2018 FINDINGS: Tracheostomy catheter tip is 5.6 cm above the carina. Central catheter tip is in the superior vena cava. No pneumothorax. There remains cardiomegaly with pulmonary venous hypertension. There is patchy interstitial and alveolar opacity, likely edema. No adenopathy. No bone lesions. IMPRESSION: Tube and catheter positions as described without pneumothorax. Pulmonary vascular congestion with interstitial and alveolar edema, stable from 1 day prior. No new opacity evident. Electronically Signed   By: [name].   On: 01/30/2018 09:07   Dg Chest Port 1 View  Result Date: 01/29/2018 CLINICAL  DATA:  Shortness of breath EXAM: PORTABLE CHEST 1 VIEW COMPARISON:  January 28, 2018 FINDINGS: Tracheostomy catheter tip is 5.0 cm above the carina. Central catheter tip is in the superior vena cava. No pneumothorax. There is diffuse airspace opacity throughout the lungs bilaterally, similar to 1 day prior. There are small pleural effusions bilaterally. No new opacity evident. Heart is enlarged with pulmonary venous hypertension. No adenopathy appreciable. No bone lesions. IMPRESSION: Tube and catheter positions as described without pneumothorax. Cardiomegaly with pulmonary venous hypertension, consistent with pulmonary vascular congestion. Small pleural effusions bilaterally. Widespread airspace opacity bilaterally may represent alveolar edema or pneumonia. Both entities may exist concurrently. A degree of underlying ARDS is also possible. The overall appearance is essentially stable compared to 1 day prior. Electronically Signed   By: [name].   On: 01/29/2018 07:45   Dg Chest Port 1 View  Result Date: 01/28/2018 CLINICAL DATA:  Pneumonia EXAM: PORTABLE CHEST 1 VIEW COMPARISON:  01/23/2018 FINDINGS: Tracheostomy and right PICC line remain in place, unchanged. Cardiomegaly. Vascular congestion and diffuse bilateral airspace opacities are again noted, unchanged. Low lung volumes. No visible effusions or acute bony abnormality. IMPRESSION: Cardiomegaly with vascular congestion and diffuse bilateral airspace disease which could reflect edema or infection. No change. Electronically Signed   By: Charlett Nose M.D.   On: 01/28/2018 09:21   Dg Chest Port 1 View  Result Date: 01/23/2018 CLINICAL DATA:  Tracheostomy placement EXAM: PORTABLE CHEST 1 VIEW COMPARISON:  January 21, 2018 FINDINGS: Tracheostomy catheter tip is 4.1 cm above the carina. Central catheter tip is at the cavoatrial junction. No pneumothorax. There is persistent moderate interstitial and patchy alveolar edema with small pleural  effusions bilaterally. Heart is mildly enlarged with pulmonary venous hypertension. No adenopathy. No bone lesions. IMPRESSION: Tube and catheter positions as described without pneumothorax. Evidence of a degree of congestive heart failure, similar to most recent prior study. Electronically Signed   By: [name].   On: 01/23/2018 12:06   Dg Chest Port 1 View  Result Date: 01/21/2018 CLINICAL DATA:  Hypoxia EXAM: PORTABLE CHEST 1 VIEW COMPARISON:  January 19, 2018 FINDINGS: Endotracheal tube tip is 3.7 cm above the carina. Nasogastric tube tip and side port are below the diaphragm. Central catheter tip is in the superior vena cava. No pneumothorax. There is moderate interstitial and patchy alveolar opacities, slightly increased. There is a small left pleural effusion. Heart is upper normal in size with pulmonary venous hypertension. No adenopathy. No bone lesions. IMPRESSION: Tube and catheter positions as  described without pneumothorax. Increase interstitial and patchy alveolar pulmonary edema, likely indicative of a degree of congestive heart failure. Small left pleural effusion. Underlying pulmonary vascular congestion. Electronically Signed   By: [name].   On: 01/21/2018 07:04   Dg Chest Port 1 View  Result Date: 01/19/2018 CLINICAL DATA:  Acute respiratory failure. EXAM: PORTABLE CHEST 1 VIEW COMPARISON:  01/18/2018 FINDINGS: Endotracheal tube terminates 4 cm above the carina. Enteric tube can be followed to the GE junction but is not well visualized beyond this due to under penetration in the upper abdomen. Right PICC terminates at the cavoatrial junction. The cardiomediastinal silhouette is unchanged allowing for differences in patient rotation. Interstitial and patchy airspace opacities are again seen bilaterally with mild improvement in the right base and without significant interval change on the left. No large pleural effusion or pneumothorax is identified.  IMPRESSION: Bilateral interstitial and airspace opacities, slightly improved on the right and likely reflecting pneumonia. Electronically Signed   By: Sebastian Ache M.D.   On: 01/19/2018 07:49   Dg Chest Port 1 View  Result Date: 01/18/2018 CLINICAL DATA:  Oxygen desaturation EXAM: PORTABLE CHEST 1 VIEW COMPARISON:  01/18/2018 FINDINGS: Endotracheal tube, right PICC line and NG tube remain in place, unchanged. Bilateral interstitial and alveolar opacities are again noted, unchanged. Small bilateral effusions suspected. Heart is upper limits normal in size. IMPRESSION: Bilateral interstitial and airspace opacities with small effusions. No real change. Electronically Signed   By: Charlett Nose M.D.   On: 01/18/2018 16:39   Dg Chest Port 1 View  Result Date: 01/18/2018 CLINICAL DATA:  Acute respiratory failure. EXAM: PORTABLE CHEST 1 VIEW COMPARISON:  01/16/2018 and CT chest 01/06/2018. FINDINGS: Endotracheal tube terminates 4.9 cm above the carina. Nasogastric tube is followed into the stomach. Right PICC tip projects over the SVC RA junction. Heart size within normal limits. Lungs are somewhat low in volume with diffuse mixed interstitial and airspace opacification, possibly mildly worsened from 01/16/2018. There may be right middle lobe and left lower lobe consolidation. No definite pleural fluid. IMPRESSION: Mixed interstitial and airspace opacification, possibly slightly worsened from 01/16/2018, with possible consolidation in the right middle and left lower lobes. Findings are indicative of persistent pneumonia, especially when compared with 01/06/2018. Electronically Signed   By: Leanna Battles M.D.   On: 01/18/2018 08:12   Dg Chest Port 1 View  Result Date: 01/16/2018 CLINICAL DATA:  Hypoxia and acute respiratory failure. EXAM: PORTABLE CHEST 1 VIEW COMPARISON:  None. FINDINGS: Cardiomediastinal silhouette is unchanged. An endotracheal tube with tip 3.2 cm above the carina, NG tube entering the  stomach, RIGHT PICC line with tip overlying the SUPERIOR cavoatrial junction are again noted. Interstitial/airspace opacities are again noted with slight improved aeration on the LEFT. Slightly increased RIGHT UPPER lobe atelectasis noted. There is no evidence of pneumothorax. IMPRESSION: Slightly increased RIGHT UPPER lobe atelectasis and slightly improved LEFT lung aeration. Otherwise no significant change. Electronically Signed   By: Harmon Pier M.D.   On: 01/16/2018 20:49   Dg Chest Port 1 View  Result Date: 01/13/2018 CLINICAL DATA:  Respiratory failure. EXAM: PORTABLE CHEST 1 VIEW COMPARISON:  One-view chest x-ray 01/12/2018. FINDINGS: Heart size is exaggerate by low lung volumes. Endotracheal tube is stable. Left greater than right interstitial and airspace disease is similar the prior exam. Overall lung volumes are slightly improved. IMPRESSION: 1. Stable appearance of left greater than right interstitial and airspace disease, likely a combination of edema and possible infection. 2.  Support apparatus is stable. 3. Slight improvement in lung volumes. Electronically Signed   By: Marin Roberts M.D.   On: 01/13/2018 07:49   Dg Chest Port 1 View  Result Date: 01/12/2018 CLINICAL DATA:  Acute respiratory failure with hypoxia. Severe sepsis with septic shock. On ventilator. EXAM: PORTABLE CHEST 1 VIEW COMPARISON:  01/11/2018 FINDINGS: Endotracheal tube and nasogastric tube remain in appropriate position. Heart size remains within normal limits. Low lung volumes again seen with diffuse interstitial infiltrates. No evidence of focal consolidation or significant pleural effusion. IMPRESSION: Stable low lung volumes with diffuse interstitial infiltrates/edema. Electronically Signed   By: Myles Rosenthal M.D.   On: 01/12/2018 07:37   Dg Chest Port 1 View  Result Date: 01/11/2018 CLINICAL DATA:  Respiratory failure EXAM: PORTABLE CHEST 1 VIEW COMPARISON:  January 10, 2018 FINDINGS: The ETT terminates 2.8 cm  above the carina. No pneumothorax. The cardiomediastinal silhouette is stable. Pulmonary edema has decreased but persists. No other changes. IMPRESSION: 1. Stable support apparatus. 2. Persistent but decreased pulmonary edema. 3. No other change. Electronically Signed   By: Gerome Sam III M.D   On: 01/11/2018 07:01   Dg Chest Port 1 View  Result Date: 01/10/2018 CLINICAL DATA:  Respiratory failure EXAM: PORTABLE CHEST 1 VIEW COMPARISON:  01/08/2018 FINDINGS: Cardiac shadow is stable. Endotracheal tube and nasogastric catheter are again noted in satisfactory position. Bilateral infiltrates are again identified and stable. No new focal abnormality is seen. No bony abnormality is noted. IMPRESSION: Stable bilateral infiltrates. Electronically Signed   By: Alcide Clever M.D.   On: 01/10/2018 07:24   Dg Chest Port 1 View  Result Date: 01/08/2018 CLINICAL DATA:  Respiratory failure EXAM: PORTABLE CHEST 1 VIEW COMPARISON:  Portable exam 1003 hours compared to 01/07/2018 FINDINGS: Tip of endotracheal tube projects 4.4 cm above carina. Nasogastric tube extends into abdomen. Enlargement of cardiac silhouette. Diffuse BILATERAL airspace infiltrates increased in upper lobes since previous exam. No gross pleural effusion or pneumothorax. IMPRESSION: Increased BILATERAL airspace infiltrates. Electronically Signed   By: Ulyses Southward M.D.   On: 01/08/2018 10:33   Dg Chest Port 1 View  Result Date: 01/07/2018 CLINICAL DATA:  Acute respiratory failure.  Hypoxia. EXAM: PORTABLE CHEST 1 VIEW COMPARISON:  CT 6 hours prior, radiographs 8 hours prior. FINDINGS: Endotracheal tube 3.9 cm from the carina. Enteric tube in place with tip below the diaphragm, not included in the field of view. Progressive bilateral lung opacities, greatest in the lung bases with air bronchograms. Heart appears normal in size. No large pleural effusion. No pneumothorax. IMPRESSION: Progressive bilateral lung opacities, greatest in the lung bases,  may reflect pneumonia or aspiration. Pulmonary edema is felt less likely. Electronically Signed   By: Rubye Oaks M.D.   On: 01/07/2018 04:01   Dg Chest Portable 1 View  Result Date: 01/06/2018 CLINICAL DATA:  ETT placement EXAM: PORTABLE CHEST 1 VIEW COMPARISON:  None. FINDINGS: Endotracheal tube tip is about 2.4 cm superior to the carina. Low lung volumes. No pleural effusion or pneumothorax. Heart size within normal limits. Streaky left greater than right hilar opacity. IMPRESSION: 1. Endotracheal tube tip about 2.4 cm superior to the carina 2. Low lung volumes. Streaky left greater than right perihilar opacity of uncertain chronicity. Electronically Signed   By: Jasmine Pang M.D.   On: 01/06/2018 19:35   [place] Ekg Site Rite  Result Date: 01/13/2018 If Site Rite image not attached, placement could not be confirmed due to current cardiac rhythm.   Consults:  Treatment Team:  Marcina Millard, MD Mick Sell, MD   Subjective:    Overnight no issues reported by nursing. Patient has been weaned off of Precedex, started on Seroquel in planning for LTAC transfer  Objective:  Vital signs for last 24 hours: Temp:  [97.5 F (36.4 C)-98.1 F (36.7 C)] 97.9 F (36.6 C) (05/07 0400) Pulse Rate:  [59-73] 60 (05/07 0700) Resp:  [14-51] 19 (05/07 0700) BP: (80-155)/(52-113) 129/72 (05/07 0700) SpO2:  [89 %-98 %] 96 % (05/07 0700) FiO2 (%):  [40 %-60 %] 60 % (05/07 0252)  Hemodynamic parameters for last 24 hours:    Intake/Output from previous day: 05/06 0701 - 05/07 0700 In: 3569 [I.V.:714; NG/GT:2025; IV Piggyback:830] Out: 4100 [Urine:4100]  Intake/Output this shift: No intake/output data recorded.  Vent settings for last 24 hours: Vent Mode: PRVC FiO2 (%):  [40 %-60 %] 60 % Set Rate:  [14 bmp] 14 bmp Vt Set:  [500 mL] 500 mL PEEP:  [5 cmH20-8 cmH20] 8 cmH20 Pressure Support:  [15 cmH20] 15 cmH20 Plateau Pressure:  [20 cmH20-26 cmH20] 24 cmH20  Physical Exam:   Patient is on mechanical ventilation through tracheostomy. Appears to track and minimally respond Vital signs: Please see the above listed vital signs HEENT: Tracheostomy in place with mechanical ventilation, trachea is midline, mild increased work of breathing, no noted jugular venous distention Cardiovascular: Regular rate and rhythm, coarse expiratory rhonchi noted, inspiratory crackles appreciated right greater than left Abdominal: PEG tube noted to be in place, mild distention, bowel sounds appreciated Extremity: Edema noted Neurologic: Patient moves extremities, appears to track  Assessment/Plan:   Patient 57 year old gentleman with traumatic brain injury with acute respiratory failure, severe bilateral pneumonia, suspected aspiration, sepsis. Presently being followed by infectious disease, on meropenem, vancomycin. Antifungal therapy has been stopped per infectious disease  History of atrial fibrillation. On amiodarone And systemic anticoagulation  History of dilated cardiomyopathy with ejection fraction 20%  Anemia. No clear evidence of active bleeding. Hemoglobin is 7.6  Leukocytosis. White count is 12.1, trending downward    Critical Care Total Time: 40 minutes  Tommy Mathews 02/04/2018  *Care during the described time interval was provided by me and/or other providers on the critical care team.  I have reviewed this patient's available data, including medical history, events of note, physical examination and test results as part of my evaluation. Patient ID: Tommy Mathews, male   DOB: 13-Feb-1961, 57 y.o.   MRN: [id number]

## 2018-02-04 NOTE — Progress Notes (Signed)
OT Cancellation Note  Patient Details Name: Tommy Mathews MRN: [id number] DOB: 11/23/1960   Cancelled Treatment:    Reason Eval/Treat Not Completed: Medical issues which prohibited therapy. Spoke with PT regarding pt's ability to participate in therapy. Per PT, nursing reporting pt currently not appropriate for therapy session (in terms of pt's ability to participate--pt noted to be on Precedex drip--and pt with current breathing concerns).  Will re-attempt OT treatment session at a later date/time as pt is medically appropriate.  Richrd Prime, MPH, MS, OTR/L ascom [phone] 02/04/18, 12:01 PM

## 2018-02-04 NOTE — Progress Notes (Signed)
Pharmacy Electrolyte Monitoring Consult:  Pharmacy consulted to assist in monitoring and replacing electrolytes in this 57 y.o. male admitted on 01/06/2018   Patient requiring mechanical ventilation and has PEG tube.   Labs:  Sodium (mmol/L)  Date Value  02/04/2018 137   Potassium (mmol/L)  Date Value  02/04/2018 3.9   Magnesium (mg/dL)  Date Value  16/07/9603 1.8   Phosphorus (mg/dL)  Date Value  54/06/8118 3.9   Calcium (mg/dL)  Date Value  14/78/2956 8.1 (L)   Albumin (g/dL)  Date Value  21/30/8657 1.8 (L)    Plan: No replacement warranted.   Will recheck electrolytes as indicated.  Pharmacy will continue to monitor and adjust per consult.   Rilley Poulter L,  02/04/2018 8:21 PM

## 2018-02-04 NOTE — Progress Notes (Signed)
PT Cancellation Note  Patient Details Name: Tommy Mathews MRN: [id number] DOB: [birth year]   Cancelled Treatment:    Reason Eval/Treat Not Completed: Patient not medically ready.  Nursing reporting pt currently not appropriate for therapy session (in terms of pt's ability to participate--pt noted to be on Precedex drip--and pt with current breathing concerns).  Will re-attempt PT treatment session at a later date/time.  Hendricks Limes, PT 02/04/18, 11:01 AM [phone]

## 2018-02-04 NOTE — Progress Notes (Signed)
Have attempted to titrate precedex down and patient has become restless and agitated therefore titrated precedex up.

## 2018-02-05 ENCOUNTER — Inpatient Hospital Stay: Payer: Medicaid Other

## 2018-02-05 LAB — BASIC METABOLIC PANEL
Anion gap: 6 (ref 5–15)
BUN: 19 mg/dL (ref 6–20)
CHLORIDE: 96 mmol/L — AB (ref 101–111)
CO2: 35 mmol/L — ABNORMAL HIGH (ref 22–32)
CREATININE: 0.62 mg/dL (ref 0.61–1.24)
Calcium: 7.9 mg/dL — ABNORMAL LOW (ref 8.9–10.3)
GFR calc non Af Amer: >60 mL/min (ref >60)
Glucose, Bld: 182 mg/dL — ABNORMAL HIGH (ref 65–99)
Potassium: 3.6 mmol/L (ref 3.5–5.1)
SODIUM: 137 mmol/L (ref 135–145)

## 2018-02-05 LAB — GLUCOSE, CAPILLARY
GLUCOSE-CAPILLARY: 173 mg/dL — AB (ref 65–99)
GLUCOSE-CAPILLARY: 186 mg/dL — AB (ref 65–99)
Glucose-Capillary: 190 mg/dL — ABNORMAL HIGH (ref 65–99)
Glucose-Capillary: 174 mg/dL — ABNORMAL HIGH (ref 65–99)
Glucose-Capillary: 160 mg/dL — ABNORMAL HIGH (ref 65–99)
Glucose-Capillary: 155 mg/dL — ABNORMAL HIGH (ref 65–99)
Glucose-Capillary: 159 mg/dL — ABNORMAL HIGH (ref 65–99)

## 2018-02-05 LAB — BLOOD CULTURE ID PANEL (REFLEXED)
Acinetobacter baumannii: NOT DETECTED
Candida albicans: NOT DETECTED
Candida glabrata: NOT DETECTED
Candida krusei: NOT DETECTED
Candida parapsilosis: NOT DETECTED
Candida tropicalis: NOT DETECTED
Carbapenem resistance: NOT DETECTED
ENTEROCOCCUS SPECIES: NOT DETECTED
Enterobacter cloacae complex: NOT DETECTED
Enterobacteriaceae species: DETECTED — AB
Escherichia coli: NOT DETECTED
HAEMOPHILUS INFLUENZAE: NOT DETECTED
Klebsiella oxytoca: NOT DETECTED
Klebsiella pneumoniae: DETECTED — AB
LISTERIA MONOCYTOGENES: NOT DETECTED
NEISSERIA MENINGITIDIS: NOT DETECTED
Proteus species: NOT DETECTED
Pseudomonas aeruginosa: NOT DETECTED
SERRATIA MARCESCENS: NOT DETECTED
STAPHYLOCOCCUS SPECIES: NOT DETECTED
STREPTOCOCCUS PNEUMONIAE: NOT DETECTED
Staphylococcus aureus (BCID): NOT DETECTED
Streptococcus agalactiae: NOT DETECTED
Streptococcus pyogenes: NOT DETECTED
Streptococcus species: NOT DETECTED

## 2018-02-05 LAB — MRSA PCR SCREENING: MRSA BY PCR: NEGATIVE

## 2018-02-05 LAB — VANCOMYCIN, TROUGH: VANCOMYCIN TR: 18 ug/mL (ref 15–20)

## 2018-02-05 MED ORDER — POTASSIUM CHLORIDE 20 MEQ PO PACK
40.0000 meq | PACK | Freq: Once | ORAL | Status: AC
  Administered 2018-02-05: 40 meq
  Filled 2018-02-05: qty 2

## 2018-02-05 MED ORDER — FUROSEMIDE 10 MG/ML IJ SOLN
60.0000 mg | INTRAMUSCULAR | Status: AC
Start: 2018-02-05 — End: 2018-02-05
  Administered 2018-02-05: 60 mg via INTRAVENOUS
  Filled 2018-02-05: qty 6

## 2018-02-05 MED ORDER — FENTANYL CITRATE (PF) 100 MCG/2ML IJ SOLN
INTRAMUSCULAR | Status: AC
  Administered 2018-02-05: 100 ug via INTRAVENOUS
  Filled 2018-02-05: qty 2

## 2018-02-05 MED ORDER — FENTANYL CITRATE (PF) 100 MCG/2ML IJ SOLN
100.0000 ug | INTRAMUSCULAR | Status: AC
  Administered 2018-02-05: 100 ug via INTRAVENOUS

## 2018-02-05 NOTE — Progress Notes (Signed)
Able to wean precedex to 0.7 mcg. Unable to wean any further d/t agitation and patient's oxygen saturations dropping. Patient became agitated anytime moved or suctioned. Followed commands and able to answer yes and no questions by nodding head. Adequate UOP for shift. Will continue to assess. Trudee Kuster

## 2018-02-05 NOTE — Progress Notes (Signed)
Pharmacy Electrolyte Monitoring Consult:  Pharmacy consulted to assist in monitoring and replacing electrolytes in this 57 y.o. male admitted on 01/06/2018   Patient requiring mechanical ventilation and has PEG tube.   Patient ordered furosemide  IV daily. Patient received additional furosemide  IV this am.   Labs:  Sodium (mmol/L)  Date Value  02/05/2018 137   Potassium (mmol/L)  Date Value  02/05/2018 3.6   Magnesium (mg/dL)  Date Value  16/07/9603 1.8   Phosphorus (mg/dL)  Date Value  54/06/8118 3.9   Calcium (mg/dL)  Date Value  14/78/2956 7.9 (L)   Albumin (g/dL)  Date Value  21/30/8657 1.8 (L)    Plan: Will order potassium VT x 1.   Will recheck electrolytes with am labs.   Pharmacy will continue to monitor and adjust per consult.   Brooklynn Brandenburg L,  02/05/2018 4:34 PM

## 2018-02-05 NOTE — Progress Notes (Signed)
Pharmacy Antibiotic Note  Tommy Mathews is a 57 y.o. male being treated for possible ventilator assiociated pneumonia.  Pharmacy has been consulted for meropenem and vancomycin dosing. Patient previously on anidulafungin; last dose 5/6. Patient currently requiring mechanical ventilation and has a PEG. PICC line changed on 5/3.   Patient previously treated for aspiration pneumonia this admission.   Plan: Continue vancomycin  IV Q12hr for goal trough of 15-20. Will follow renal function daily while on vancomycin.   Continue Meropenem 1g IV Q8hr.   Per ID will plan to discontinue antibiotics on 5/10.    Height:  (177.8 cm) Weight: 293 lb 10.4 oz (133.2 kg) IBW/kg (Calculated) : 73  Adjusted Body Weight: 98kg   Temp (24hrs), Avg:98.8 F (37.1 C), Min:97.2 F (36.2 C), Max:99.9 F (37.7 C)  Recent Labs  Lab 01/31/18 0817 01/31/18 0818 01/31/18 1049 02/01/18 0411  02/02/18 0433 02/03/18 0441 02/03/18 1848 02/04/18 0835 02/05/18 0938  WBC  --  12.6*  --  17.5*  --  13.1* 12.1*  --  17.4*  --   CREATININE  --  0.89  --  0.81  --  0.62 0.71  --  0.68 0.62  LATICACIDVEN 2.2*  --  1.3  --   --   --   --   --   --   --   VANCOTROUGH  --   --   --   --    < >  --  28*  --   --  18  VANCORANDOM  --   --   --   --   --   --   --  12  --   --    < > = values in this interval not displayed.    Estimated Creatinine Clearance: 143.3 mL/min (by C-G formula based on SCr of 0.62 mg/dL).    No Known Allergies  Antimicrobials this admission: Vancomycin 5/3 >>  Meropenem 5/3 >>  Anidulafungin 5/3 >> 5/6   Microbiology results: 5/3 BCx: gram negative rods 1/4  5/7 BCx: no growth < 12 hours  5/3 UCx: NG  5/3 Tracheal Aspirate: mod GPC, GPR  4/20 MRSA PCR: positive  Thank you for allowing pharmacy to be a part of this patient's care.  Abhimanyu Cruces L 02/05/2018 4:35 PM

## 2018-02-05 NOTE — Progress Notes (Signed)
Follow up - Critical Care Medicine Note  Patient Details:    Tommy Mathews is an 57 y.o. male.with a past medical history remarkable for diabetes, gastroesophageal reflux disease, hyperlipidemia, hypertension, traumatic brain injury, status post tracheostomy and PEG tube placement, is in the intensive care unit for sepsis, severe pneumonia, suspected aspiration  Lines, Airways, Drains: PICC Triple Lumen 01/31/18 PICC Right 46 cm 0 cm (Active)  Indication for Insertion or Continuance of Line Administration of hyperosmolar/irritating solutions (i.e. TPN, Vancomycin, etc.) 02/03/2018  8:00 AM  Exposed Catheter (cm) 0 cm 01/31/2018 12:00 PM  Site Assessment Clean;Dry;Intact 02/02/2018  8:50 PM  Lumen #1 Status Infusing 02/02/2018  8:50 PM  Lumen #2 Status Infusing 02/02/2018  8:50 PM  Lumen #3 Status Saline locked 02/02/2018  8:50 PM  Dressing Type Transparent 02/02/2018  8:50 PM  Dressing Status Clean;Dry;Intact 02/02/2018  8:50 PM  Line Care Connections checked and tightened 02/02/2018  8:50 PM  Line Adjustment (NICU/IV Team Only) No 02/01/2018 12:39 PM  Dressing Change Due 02/07/18 02/02/2018 11:06 AM     Gastrostomy/Enterostomy PEG-jejunostomy (Active)  Surrounding Skin Dry;Intact 02/03/2018  3:42 AM  Tube Status Patent 02/03/2018  3:42 AM  Drainage Appearance None 02/03/2018  3:42 AM  Dressing Status Clean;Dry;Intact 02/03/2018  3:42 AM  Dressing Intervention New dressing 01/31/2018  6:30 PM  Dressing Type Split gauze 02/03/2018  3:42 AM  Dressing Change Due 02/06/18 02/02/2018 11:30 PM  G Port Intake (mL) 80 ml 01/30/2018  5:30 PM     Urethral Catheter (Active)  Indication for Insertion or Continuance of Catheter Bladder outlet obstruction / other urologic reason 02/03/2018  3:42 AM  Site Assessment Intact;Clean 02/03/2018  3:42 AM  Catheter Maintenance Bag below level of bladder;No dependent loops;Seal intact;Bag emptied prior to transport 02/03/2018  3:42 AM  Collection Container Standard drainage bag 02/03/2018  3:42 AM   Securement Method Leg strap 02/03/2018  3:42 AM  Urinary Catheter Interventions Unclamped 02/03/2018  3:42 AM  Input (mL) 0 mL 01/15/2018 11:47 PM  Output (mL) 675 mL 02/03/2018  6:00 AM    Anti-infectives:  Anti-infectives (From admission, onward)   Start     Dose/Rate Route Frequency Ordered Stop   02/03/18 2100  vancomycin (VANCOCIN) 1,500 mg in sodium chloride 0.9 % 500 mL IVPB     1,500 mg 250 mL/hr over 120 Minutes Intravenous Every 12 hours 02/03/18 1950     02/03/18 0522  vancomycin (VANCOCIN) 1,500 mg in sodium chloride 0.9 % 500 mL IVPB  Status:  Discontinued     1,500 mg 250 mL/hr over 120 Minutes Intravenous As needed 02/03/18 0523 02/03/18 1950   02/01/18 2100  vancomycin (VANCOCIN) 1,500 mg in sodium chloride 0.9 % 500 mL IVPB  Status:  Discontinued     1,500 mg 250 mL/hr over 120 Minutes Intravenous Every 8 hours 02/01/18 2038 02/03/18 0523   02/01/18 1800  anidulafungin (ERAXIS) 100 mg in sodium chloride 0.9 % 100 mL IVPB  Status:  Discontinued     100 mg 78 mL/hr over 100 Minutes Intravenous Every 24 hours 01/31/18 1734 02/04/18 0911   02/01/18 1500  anidulafungin (ERAXIS) 100 mg in sodium chloride 0.9 % 100 mL IVPB  Status:  Discontinued     100 mg 78 mL/hr over 100 Minutes Intravenous Every 24 hours 01/31/18 1540 01/31/18 1734   01/31/18 2000  vancomycin (VANCOCIN) 1,250 mg in sodium chloride 0.9 % 250 mL IVPB  Status:  Discontinued     1,250 mg 166.7 mL/hr over  90 Minutes Intravenous Every 8 hours 01/31/18 1734 02/01/18 2038   01/31/18 1900  vancomycin (VANCOCIN) 1,250 mg in sodium chloride 0.9 % 250 mL IVPB  Status:  Discontinued     1,250 mg 166.7 mL/hr over 90 Minutes Intravenous Every 8 hours 01/31/18 1728 01/31/18 1734   01/31/18 1545  anidulafungin (ERAXIS) 200 mg in sodium chloride 0.9 % 200 mL IVPB     200 mg 78 mL/hr over 200 Minutes Intravenous  Once 01/31/18 1540 01/31/18 2107   01/31/18 0930  vancomycin (VANCOCIN) IVPB 1000 mg/200 mL premix     1,000  mg 200 mL/hr over 60 Minutes Intravenous STAT 01/31/18 0922 01/31/18 1545   01/31/18 0800  meropenem (MERREM) 1 g in sodium chloride 0.9 % 100 mL IVPB     1 g 200 mL/hr over 30 Minutes Intravenous Every 8 hours 01/31/18 0748 02/07/18 2359   01/21/18 0600  ceFAZolin (ANCEF) 3 g in dextrose 5 % 50 mL IVPB     3 g 130 mL/hr over 30 Minutes Intravenous  Once 01/20/18 1829 01/21/18 0654   01/20/18 0600  vancomycin (VANCOCIN) 1,250 mg in sodium chloride 0.9 % 250 mL IVPB  Status:  Discontinued     1,250 mg 166.7 mL/hr over 90 Minutes Intravenous Every 12 hours 01/20/18 0153 01/20/18 0905   01/19/18 0200  vancomycin (VANCOCIN) 1,250 mg in sodium chloride 0.9 % 250 mL IVPB  Status:  Discontinued     1,250 mg 166.7 mL/hr over 90 Minutes Intravenous Every 8 hours 01/18/18 1953 01/20/18 0153   01/18/18 2000  vancomycin (VANCOCIN) 1,250 mg in sodium chloride 0.9 % 250 mL IVPB     1,250 mg 166.7 mL/hr over 90 Minutes Intravenous  Once 01/18/18 1953 01/18/18 2246   01/18/18 2000  ceFEPIme (MAXIPIME) 2 g in sodium chloride 0.9 % 100 mL IVPB  Status:  Discontinued     2 g 200 mL/hr over 30 Minutes Intravenous Every 8 hours 01/18/18 1953 01/20/18 0905   01/12/18 0900  vancomycin (VANCOCIN) IVPB 1000 mg/200 mL premix  Status:  Discontinued     1,000 mg 200 mL/hr over 60 Minutes Intravenous Every 24 hours 01/11/18 1329 01/13/18 1548   01/10/18 1000  ceFEPIme (MAXIPIME) 2 g in sodium chloride 0.9 % 100 mL IVPB     2 g 200 mL/hr over 30 Minutes Intravenous Every 12 hours 01/10/18 0919 01/13/18 2129   01/09/18 2200  vancomycin (VANCOCIN) IVPB 750 mg/150 ml premix  Status:  Discontinued     750 mg 150 mL/hr over 60 Minutes Intravenous Every 12 hours 01/09/18 1952 01/11/18 1329   01/08/18 1503  Ampicillin-Sulbactam (UNASYN) 3 g in sodium chloride 0.9 % 100 mL IVPB  Status:  Discontinued     3 g 200 mL/hr over 30 Minutes Intravenous Every 6 hours 01/08/18 0931 01/08/18 1024   01/08/18 1230  ceFEPIme  (MAXIPIME) 2 g in sodium chloride 0.9 % 100 mL IVPB  Status:  Discontinued     2 g 200 mL/hr over 30 Minutes Intravenous Every 12 hours 01/08/18 1151 01/10/18 0919   01/08/18 1100  metroNIDAZOLE (FLAGYL) IVPB 500 mg  Status:  Discontinued     500 mg 100 mL/hr over 60 Minutes Intravenous Every 8 hours 01/08/18 1024 01/13/18 1547   01/08/18 0800  Ampicillin-Sulbactam (UNASYN) 3 g in sodium chloride 0.9 % 100 mL IVPB  Status:  Discontinued     3 g 200 mL/hr over 30 Minutes Intravenous Every 12 hours 01/07/18 1906 01/08/18 0931  01/08/18 0600  vancomycin (VANCOCIN) IVPB 1000 mg/200 mL premix  Status:  Discontinued     1,000 mg 200 mL/hr over 60 Minutes Intravenous Every 12 hours 01/08/18 0545 01/09/18 1952   01/07/18 2100  ceFEPIme (MAXIPIME) 2 g in sodium chloride 0.9 % 100 mL IVPB  Status:  Discontinued     2 g 200 mL/hr over 30 Minutes Intravenous Every 24 hours 01/06/18 2349 01/07/18 1008   01/07/18 1800  Ampicillin-Sulbactam (UNASYN) 3 g in sodium chloride 0.9 % 100 mL IVPB  Status:  Discontinued     3 g 200 mL/hr over 30 Minutes Intravenous Every 6 hours 01/07/18 1436 01/07/18 1906   01/07/18 1015  Ampicillin-Sulbactam (UNASYN) 3 g in sodium chloride 0.9 % 100 mL IVPB  Status:  Discontinued     3 g 200 mL/hr over 30 Minutes Intravenous Every 12 hours 01/07/18 1008 01/07/18 1436   01/07/18 0600  vancomycin (VANCOCIN) 1,250 mg in sodium chloride 0.9 % 250 mL IVPB  Status:  Discontinued     1,250 mg 166.7 mL/hr over 90 Minutes Intravenous Every 24 hours 01/06/18 2349 01/07/18 1917   01/06/18 2100  ceFEPIme (MAXIPIME) 2 g in sodium chloride 0.9 % 100 mL IVPB     2 g 200 mL/hr over 30 Minutes Intravenous  Once 01/06/18 2056 01/06/18 2320   01/06/18 2100  vancomycin (VANCOCIN) IVPB 1000 mg/200 mL premix     1,000 mg 200 mL/hr over 60 Minutes Intravenous  Once 01/06/18 2056 01/07/18 0024      Microbiology: Results for orders placed or performed during the hospital encounter of 01/06/18   Blood Culture (routine x 2)     Status: None   Collection Time: 01/06/18  7:44 PM  Result Value Ref Range Status   Specimen Description BLOOD BLOOD LEFT WRIST  Final   Special Requests   Final    BOTTLES DRAWN AEROBIC AND ANAEROBIC Blood Culture adequate volume   Culture   Final    NO GROWTH 5 DAYS Performed at [facility], [address], [place], [place] [address]    Report Status 01/11/2018 FINAL  Final  Urine culture     Status: Abnormal   Collection Time: 01/06/18  7:44 PM  Result Value Ref Range Status   Specimen Description   Final    URINE, RANDOM Performed at [facility], [address], [place], [place] [address]    Special Requests   Final    NONE Performed at [facility], [address] [place], [place] [address]    Culture (A)  Final    10,000 COLONIES/mL STAPHYLOCOCCUS SPECIES (COAGULASE NEGATIVE) CALL MICROBIOLOGY LAB IF SENSITIVITIES ARE REQUIRED. Performed at [facility], [address] [place], [place] [address]    Report Status 01/08/2018 FINAL  Final  MRSA PCR Screening     Status: Abnormal   Collection Time: 01/06/18 11:51 PM  Result Value Ref Range Status   MRSA by PCR POSITIVE (A) NEGATIVE Final    Comment:        The GeneXpert MRSA Assay (FDA approved for NASAL specimens only), is one component of a comprehensive MRSA colonization surveillance program. It is not intended to diagnose MRSA infection nor to guide or monitor treatment for MRSA infections. RESULT CALLED TO, READ BACK BY AND VERIFIED WITH: BETH BUONO 01/07/18 @ 0121  MLK   Performed at [facility], [address], [place], [place] [address]   Blood Culture (routine x 2)  Status: None   Collection Time: 01/07/18  7:19 AM  Result Value Ref Range Status   Specimen Description BLOOD RIGHT HAND  Final   Special Requests   Final    BOTTLES DRAWN AEROBIC AND ANAEROBIC Blood Culture results may not be optimal due to an inadequate  volume of blood received in culture bottles   Culture   Final    NO GROWTH 5 DAYS Performed at [facility], [address], [place], [place] [address]    Report Status 01/12/2018 FINAL  Final  Culture, respiratory (NON-Expectorated)     Status: None   Collection Time: 01/07/18 12:28 PM  Result Value Ref Range Status   Specimen Description   Final    TRACHEAL ASPIRATE Performed at [facility], [address] [place], [place] [address]    Special Requests   Final    Normal Performed at [facility], [address] [place], [place] [address]    Gram Stain   Final    MODERATE WBC PRESENT,BOTH PMN AND MONONUCLEAR RARE GRAM POSITIVE COCCI RARE YEAST    Culture   Final    Consistent with normal respiratory flora. Performed at [facility], [address], [place], [place] [address]    Report Status 01/09/2018 FINAL  Final  Culture, expectorated sputum-assessment     Status: None   Collection Time: 01/18/18  5:07 PM  Result Value Ref Range Status   Specimen Description ENDOTRACHEAL  Final   Special Requests NONE  Final   Sputum evaluation   Final    THIS SPECIMEN IS ACCEPTABLE FOR SPUTUM CULTURE Performed at [facility], [address], [place], [place] [address]    Report Status 01/18/2018 FINAL  Final  Culture, respiratory (NON-Expectorated)     Status: None   Collection Time: 01/18/18  5:07 PM  Result Value Ref Range Status   Specimen Description   Final    ENDOTRACHEAL Performed at [facility], [address] [place], [place] [address]    Special Requests   Final    NONE Reflexed from [id number] Performed at [facility], [address] [place], [place] [address]    Gram Stain   Final    MODERATE WBC PRESENT,BOTH PMN AND MONONUCLEAR NO SQUAMOUS EPITHELIAL CELLS SEEN RARE BUDDING YEAST SEEN    Culture   Final    RARE Consistent with normal respiratory flora. Performed at [facility],  [address], [place], [place] [address]    Report Status 01/21/2018 FINAL  Final  MRSA PCR Screening     Status: Abnormal   Collection Time: 01/18/18  8:26 PM  Result Value Ref Range Status   MRSA by PCR POSITIVE (A) NEGATIVE Final    Comment:        The GeneXpert MRSA Assay (FDA approved for NASAL specimens only), is one component of a comprehensive MRSA colonization surveillance program. It is not intended to diagnose MRSA infection nor to guide or monitor treatment for MRSA infections. RESULT CALLED TO, READ BACK BY AND VERIFIED WITH: ANGELA BREHUN AT 2200 ON 01/18/18 RWW Performed at [facility], [address] [place], [place] [address]   CULTURE, BLOOD (ROUTINE X 2) w Reflex to ID Panel     Status: None (Preliminary result)   Collection Time: 01/31/18  8:17 AM  Result Value Ref Range Status   Specimen Description   Final    BLOOD LEFT HAND Performed at [facility], [address] [place], [place] [address]    Special Requests   Final    BOTTLES DRAWN AEROBIC AND ANAEROBIC Blood Culture results may not be optimal due to an excessive volume of blood received in culture bottles   Culture  Setup Time   Final    Organism ID to follow GRAM NEGATIVE RODS AEROBIC BOTTLE ONLY CRITICAL RESULT CALLED TO, READ BACK BY AND VERIFIED WITH: DAVID BESANTI ON 02/05/18 AT 0228 JAG Performed at [facility], [address] [place], [place] [address]    Culture GRAM NEGATIVE RODS  Final   Report Status PENDING  Incomplete  Blood Culture ID Panel (Reflexed)     Status: Abnormal   Collection Time: 01/31/18  8:17 AM  Result Value Ref Range Status   Enterococcus species NOT DETECTED NOT DETECTED Final   Listeria monocytogenes NOT DETECTED NOT DETECTED Final   Staphylococcus species NOT DETECTED NOT DETECTED Final   Staphylococcus aureus NOT DETECTED NOT DETECTED Final   Streptococcus species NOT DETECTED NOT DETECTED Final   Streptococcus agalactiae NOT  DETECTED NOT DETECTED Final   Streptococcus pneumoniae NOT DETECTED NOT DETECTED Final   Streptococcus pyogenes NOT DETECTED NOT DETECTED Final   Acinetobacter baumannii NOT DETECTED NOT DETECTED Final   Enterobacteriaceae species DETECTED (A) NOT DETECTED Final    Comment: Enterobacteriaceae represent a large family of gram-negative bacteria, not a single organism. CRITICAL RESULT CALLED TO, READ BACK BY AND VERIFIED WITH: DAVID BESANTI ON 02/05/18 AT 0228 JAG    Enterobacter cloacae complex NOT DETECTED NOT DETECTED Final   Escherichia coli NOT DETECTED NOT DETECTED Final   Klebsiella oxytoca NOT DETECTED NOT DETECTED Final   Klebsiella pneumoniae DETECTED (A) NOT DETECTED Final    Comment: CRITICAL RESULT CALLED TO, READ BACK BY AND VERIFIED WITH: DAVID BESANTI ON 02/05/18 AT 0228 JAG    Proteus species NOT DETECTED NOT DETECTED Final   Serratia marcescens NOT DETECTED NOT DETECTED Final   Carbapenem resistance NOT DETECTED NOT DETECTED Final   Haemophilus influenzae NOT DETECTED NOT DETECTED Final   Neisseria meningitidis NOT DETECTED NOT DETECTED Final   Pseudomonas aeruginosa NOT DETECTED NOT DETECTED Final   Candida albicans NOT DETECTED NOT DETECTED Final   Candida glabrata NOT DETECTED NOT DETECTED Final   Candida krusei NOT DETECTED NOT DETECTED Final   Candida parapsilosis NOT DETECTED NOT DETECTED Final   Candida tropicalis NOT DETECTED NOT DETECTED Final    Comment: Performed at [facility], [address] [place], [place] [address]  Urine Culture     Status: None   Collection Time: 01/31/18  8:30 AM  Result Value Ref Range Status   Specimen Description   Final    URINE, RANDOM Performed at [facility], [address], [place], [place] [address]    Special Requests   Final    NONE Performed at [facility], [address] [place], [place] [address]    Culture   Final    NO GROWTH Performed at [facility], [address] [place], [place] [address]    Report Status 02/01/2018 FINAL  Final  CULTURE, BLOOD (ROUTINE X 2) w Reflex to ID Panel     Status: None (Preliminary result)   Collection Time: 01/31/18  8:53 AM  Result Value Ref Range Status   Specimen Description BLOOD BLOOD RIGHT HAND  Final   Special Requests   Final    BOTTLES DRAWN AEROBIC AND ANAEROBIC Blood Culture  adequate volume   Culture  Setup Time   Final    GRAM NEGATIVE RODS AEROBIC BOTTLE ONLY CRITICAL RESULT CALLED TO, READ BACK BY AND VERIFIED WITH: DAVID BESANTI AT 0340 ON 02/05/18 [facility]. Performed at [facility], [address] [place], [place] [address]    Culture GRAM NEGATIVE RODS  Final   Report Status PENDING  Incomplete  Culture, respiratory (NON-Expectorated)     Status: None   Collection Time: 01/31/18 10:47 AM  Result Value Ref Range Status   Specimen Description   Final    TRACHEAL ASPIRATE Performed at [facility], [address] [place], [place] [address]    Special Requests   Final    NONE Performed at [facility], [address] [place], [place] [address]    Gram Stain   Final    MODERATE WBC PRESENT, PREDOMINANTLY PMN MODERATE GRAM POSITIVE COCCI MODERATE GRAM POSITIVE RODS    Culture   Final    Consistent with normal respiratory flora. Performed at [facility], [address] [place], [place] [address]    Report Status 02/02/2018 FINAL  Final  CULTURE, BLOOD (ROUTINE X 2) w Reflex to ID Panel     Status: None (Preliminary result)   Collection Time: 02/04/18  5:41 PM  Result Value Ref Range Status   Specimen Description BLOOD BLOOD LEFT HAND  Final   Special Requests   Final    BOTTLES DRAWN AEROBIC AND ANAEROBIC Blood Culture adequate volume   Culture   Final    NO GROWTH < 12 HOURS Performed at [facility], [address], [place], [place] [address]    Report Status PENDING  Incomplete  CULTURE, BLOOD (ROUTINE X 2) w Reflex to ID Panel     Status:  None (Preliminary result)   Collection Time: 02/04/18  8:50 PM  Result Value Ref Range Status   Specimen Description BLOOD LEFT HAND  Final   Special Requests   Final    BOTTLES DRAWN AEROBIC AND ANAEROBIC Blood Culture adequate volume   Culture   Final    NO GROWTH < 12 HOURS Performed at [facility], [address], [place], [place] [address]    Report Status PENDING  Incomplete    Best Practice/Protocols:    Events:  Studies: Ct Abdomen Pelvis Wo Contrast  Result Date: 01/06/2018 CLINICAL DATA:  Respiratory arrest.  Suspect pulmonary embolism. EXAM: CT CHEST, ABDOMEN AND PELVIS WITHOUT CONTRAST TECHNIQUE: Multidetector CT imaging of the chest, abdomen and pelvis was performed following the standard protocol without IV contrast. COMPARISON:  Chest radiograph January 06, 2018 FINDINGS: CT CHEST FINDINGS CARDIOVASCULAR: Heart and pericardium are unremarkable. Thoracic aorta is normal course and caliber, mild calcific atherosclerosis. MEDIASTINUM/NODES: No mediastinal mass. No lymphadenopathy by CT size criteria limited assessment without contrast. Normal appearance of thoracic esophagus though not tailored for evaluation. LUNGS/PLEURA: Tracheobronchial tree is patent, no pneumothorax. Endotracheal tube tip above the carina. Bilateral tree-in-bud infiltrates and dense consolidation with air bronchograms. RIGHT upper lobe collapse with soft tissue effacing RIGHT upper lobe bronchus. MUSCULOSKELETAL: Included soft tissues and included osseous structures appear normal. CT ABDOMEN PELVIS FINDINGS HEPATOBILIARY: Subcentimeter layering gallstones without CT findings of acute cholecystitis by noncontrast CT. Trace perihepatic focal fat. PANCREAS: Normal. SPLEEN: Normal. ADRENALS/URINARY TRACT: Kidneys are orthotopic, demonstrating normal size and morphology. No nephrolithiasis, hydronephrosis; limited assessment for renal masses on this nonenhanced examination. The unopacified ureters are normal  in course and caliber. Urinary bladder decompressed  by Foley catheter. Normal adrenal glands. STOMACH/BOWEL: Nasogastric tube terminates in distal stomach. Gas distended small bowel measuring to 3.8 cm with multiple transition points in air-fluid levels. Large bowel normal in course and caliber. Normal appendix. VASCULAR/LYMPHATIC: Mildly ectatic 2.4 cm infrarenal aorta. Mild calcific atherosclerosis. No lymphadenopathy by CT size criteria. REPRODUCTIVE: Normal. OTHER: No intraperitoneal free fluid or free air. MUSCULOSKELETAL: Non-acute. Small fat containing LEFT inguinal hernia. Mild L5-S1 degenerative disc. IMPRESSION: CT CHEST: 1. Pulmonary embolism cannot be excluded by noncontrast CT. 2. Multifocal severe pneumonia with RIGHT upper lobe collapse and soft tissue obstructing RIGHT upper lobe bronchus. Consider bronchoscopy. 3. Endotracheal tube tip above the carina. CT ABDOMEN AND PELVIS: 1. Mild small bowel ileus, potential enteritis. Nasogastric tube terminates in distal stomach. 2. Cholelithiasis without CT findings of acute cholecystitis. Aortic Atherosclerosis (ICD10-I70.0). Electronically Signed   By: Awilda Metro M.D.   On: 01/06/2018 21:44   Dg Abd 1 View  Result Date: 01/27/2018 CLINICAL DATA:  Ileus EXAM: ABDOMEN - 1 VIEW COMPARISON:  01/24/2018 FINDINGS: Diffuse gaseous distention of bowel again noted compatible with ileus, unchanged. No free air or organomegaly. No acute bony abnormality. IMPRESSION: Stable ileus pattern. Electronically Signed   By: Charlett Nose M.D.   On: 01/27/2018 07:51   Dg Abd 1 View  Result Date: 01/24/2018 CLINICAL DATA:  Constipation EXAM: ABDOMEN - 1 VIEW COMPARISON:  01/13/2018 FINDINGS: A gastrostomy tube is demonstrated over the stomach. There is mild gaseous distention of the stomach, colon, and small bowel. Prominent stool in the rectum. Changes may be due to adynamic ileus or pseudo-obstruction from impacted rectal stool. No radiopaque stones. Infiltration  or atelectasis in the lung bases. IMPRESSION: Mildly gas distended stomach, small bowel, and colon with prominent stool in the rectum suggesting adynamic ileus or pseudo-obstruction due to stool impaction. Electronically Signed   By: Burman Nieves M.D.   On: 01/24/2018 23:47   Dg Abd 1 View  Result Date: 01/13/2018 CLINICAL DATA:  Orogastric tube placement EXAM: ABDOMEN - 1 VIEW COMPARISON:  CT 01/06/2018 FINDINGS: Consolidation at the left lung base. Esophageal tube tip projects over the proximal stomach. Air-filled dilated bowel in the upper abdomen. IMPRESSION: Esophageal tube tip overlies the proximal stomach. There is consolidation at the left lung base. Electronically Signed   By: Jasmine Pang M.D.   On: 01/13/2018 21:32   Ct Chest Wo Contrast  Result Date: 01/06/2018 CLINICAL DATA:  Respiratory arrest.  Suspect pulmonary embolism. EXAM: CT CHEST, ABDOMEN AND PELVIS WITHOUT CONTRAST TECHNIQUE: Multidetector CT imaging of the chest, abdomen and pelvis was performed following the standard protocol without IV contrast. COMPARISON:  Chest radiograph January 06, 2018 FINDINGS: CT CHEST FINDINGS CARDIOVASCULAR: Heart and pericardium are unremarkable. Thoracic aorta is normal course and caliber, mild calcific atherosclerosis. MEDIASTINUM/NODES: No mediastinal mass. No lymphadenopathy by CT size criteria limited assessment without contrast. Normal appearance of thoracic esophagus though not tailored for evaluation. LUNGS/PLEURA: Tracheobronchial tree is patent, no pneumothorax. Endotracheal tube tip above the carina. Bilateral tree-in-bud infiltrates and dense consolidation with air bronchograms. RIGHT upper lobe collapse with soft tissue effacing RIGHT upper lobe bronchus. MUSCULOSKELETAL: Included soft tissues and included osseous structures appear normal. CT ABDOMEN PELVIS FINDINGS HEPATOBILIARY: Subcentimeter layering gallstones without CT findings of acute cholecystitis by noncontrast CT. Trace  perihepatic focal fat. PANCREAS: Normal. SPLEEN: Normal. ADRENALS/URINARY TRACT: Kidneys are orthotopic, demonstrating normal size and morphology. No nephrolithiasis, hydronephrosis; limited assessment for renal masses on this nonenhanced examination. The unopacified ureters are normal in course and  caliber. Urinary bladder decompressed by Foley catheter. Normal adrenal glands. STOMACH/BOWEL: Nasogastric tube terminates in distal stomach. Gas distended small bowel measuring to 3.8 cm with multiple transition points in air-fluid levels. Large bowel normal in course and caliber. Normal appendix. VASCULAR/LYMPHATIC: Mildly ectatic 2.4 cm infrarenal aorta. Mild calcific atherosclerosis. No lymphadenopathy by CT size criteria. REPRODUCTIVE: Normal. OTHER: No intraperitoneal free fluid or free air. MUSCULOSKELETAL: Non-acute. Small fat containing LEFT inguinal hernia. Mild L5-S1 degenerative disc. IMPRESSION: CT CHEST: 1. Pulmonary embolism cannot be excluded by noncontrast CT. 2. Multifocal severe pneumonia with RIGHT upper lobe collapse and soft tissue obstructing RIGHT upper lobe bronchus. Consider bronchoscopy. 3. Endotracheal tube tip above the carina. CT ABDOMEN AND PELVIS: 1. Mild small bowel ileus, potential enteritis. Nasogastric tube terminates in distal stomach. 2. Cholelithiasis without CT findings of acute cholecystitis. Aortic Atherosclerosis (ICD10-I70.0). Electronically Signed   By: Awilda Metro M.D.   On: 01/06/2018 21:44   Dg Chest Port 1 View  Result Date: 02/05/2018 CLINICAL DATA:  57 year old male with acute respiratory failure. EXAM: PORTABLE CHEST 1 VIEW COMPARISON:  Chest radiograph dated 02/04/2018 FINDINGS: Right-sided PICC in similar position. Tracheostomy above the carina. Diffuse airspace opacity throughout the lungs with no significant interval change compared to the prior radiograph. No pneumothorax. The cardiac borders are silhouetted. No acute osseous pathology. IMPRESSION: No  significant interval change. Electronically Signed   By: Elgie Collard M.D.   On: 02/05/2018 06:22   Dg Chest Port 1 View  Result Date: 02/04/2018 CLINICAL DATA:  Acute respiratory failure, pulmonary edema, sepsis, hypertension, diabetes, current smoker. EXAM: PORTABLE CHEST 1 VIEW COMPARISON:  Portable chest x-ray of Feb 03, 2018 FINDINGS: Diffuse airspace opacities have worsened bilaterally. The cardiac silhouette is mildly enlarged. The pulmonary vascularity is indistinct. There is calcification in the wall of the aortic arch. There may be pleural fluid layering posteriorly on the left. The tracheostomy tube tip projects between the clavicular heads. The right sided PICC line tip projects over the midportion of the SVC. IMPRESSION: Worsening airspace opacities bilaterally consistent with progressive pneumonia or pulmonary edema. Thoracic aortic atherosclerosis. Electronically Signed   By: David  [place] M.D.   On: 02/04/2018 10:28   Dg Chest Port 1 View  Result Date: 02/03/2018 CLINICAL DATA:  Follow-up pneumonia EXAM: PORTABLE CHEST 1 VIEW COMPARISON:  02/02/2018 FINDINGS: Cardiac shadow is stable. Right-sided PICC line and tracheostomy tube are noted and stable. Diffuse bilateral infiltrates are again seen and stable. No bony abnormality is noted. IMPRESSION: Stable appearance when compared with the previous exam. Electronically Signed   By: Alcide Clever M.D.   On: 02/03/2018 07:15   Dg Chest Port 1 View  Result Date: 02/02/2018 CLINICAL DATA:  Pneumonia EXAM: PORTABLE CHEST 1 VIEW COMPARISON:  Chest radiograph from one day prior. FINDINGS: Tracheostomy tube tip overlies the tracheal air column at the thoracic inlet. Right PICC terminates at the cavoatrial junction. Stable cardiomediastinal silhouette with normal heart size. No pneumothorax. No pleural effusion. Severe patchy opacity throughout both lungs is not appreciably changed. IMPRESSION: 1. Stable support structures as detailed. 2. Stable  severe diffuse patchy lung opacities suggesting multilobar pneumonia and/or ARDS. Electronically Signed   By: Delbert Phenix M.D.   On: 02/02/2018 07:14   Dg Chest Port 1 View  Result Date: 02/01/2018 CLINICAL DATA:  Pneumonia EXAM: PORTABLE CHEST 1 VIEW COMPARISON:  Chest radiograph from one day prior. FINDINGS: Tracheostomy tube tip overlies the tracheal air column at the thoracic inlet. Right PICC terminates at the cavoatrial  junction. Stable cardiomediastinal silhouette with normal heart size. No pneumothorax. No pleural effusion. Hazy airspace opacities throughout both lungs are slightly improved. IMPRESSION: Hazy airspace opacities throughout both lungs, slightly improved. Electronically Signed   By: Delbert Phenix M.D.   On: 02/01/2018 09:03   Dg Chest Port 1 View  Result Date: 01/31/2018 CLINICAL DATA:  PICC line placement. EXAM: PORTABLE CHEST 1 VIEW COMPARISON:  01/31/2018 and 01/30/2018. FINDINGS: 1225 hours. Right arm PICC extends to the level of the superior cavoatrial junction. Tracheostomy appears unchanged. There are stable diffuse bilateral pulmonary opacities. No pneumothorax or significant pleural effusion. The heart size and mediastinal contours are grossly stable, partially obscured by the airspace opacities. IMPRESSION: PICC line terminates at the level of the superior cavoatrial junction. Persistent bilateral airspace opacities. Electronically Signed   By: Carey Bullocks M.D.   On: 01/31/2018 12:52   Dg Chest Port 1 View  Result Date: 01/31/2018 CLINICAL DATA:  Congestive heart failure. EXAM: PORTABLE CHEST 1 VIEW COMPARISON:  Radiograph of Jan 30, 2018. FINDINGS: Stable cardiomediastinal silhouette. Tracheostomy tube and right sided PICC line are unchanged in position. Hypoinflation of the lungs is noted. Stable bilateral lung opacities are noted concerning for edema or pneumonia. No pneumothorax or significant pleural effusion is noted. Bony thorax is unremarkable. IMPRESSION: Stable  support apparatus. Hypoinflation of the lungs. Stable bilateral lung opacities concerning for edema or pneumonia. Electronically Signed   By: Lupita Raider, M.D.   On: 01/31/2018 07:13   Dg Chest Port 1 View  Result Date: 01/30/2018 CLINICAL DATA:  Congestive heart failure EXAM: PORTABLE CHEST 1 VIEW COMPARISON:  Jan 29, 2018 FINDINGS: Tracheostomy catheter tip is 5.6 cm above the carina. Central catheter tip is in the superior vena cava. No pneumothorax. There remains cardiomegaly with pulmonary venous hypertension. There is patchy interstitial and alveolar opacity, likely edema. No adenopathy. No bone lesions. IMPRESSION: Tube and catheter positions as described without pneumothorax. Pulmonary vascular congestion with interstitial and alveolar edema, stable from 1 day prior. No new opacity evident. Electronically Signed   By: [name].   On: 01/30/2018 09:07   Dg Chest Port 1 View  Result Date: 01/29/2018 CLINICAL DATA:  Shortness of breath EXAM: PORTABLE CHEST 1 VIEW COMPARISON:  January 28, 2018 FINDINGS: Tracheostomy catheter tip is 5.0 cm above the carina. Central catheter tip is in the superior vena cava. No pneumothorax. There is diffuse airspace opacity throughout the lungs bilaterally, similar to 1 day prior. There are small pleural effusions bilaterally. No new opacity evident. Heart is enlarged with pulmonary venous hypertension. No adenopathy appreciable. No bone lesions. IMPRESSION: Tube and catheter positions as described without pneumothorax. Cardiomegaly with pulmonary venous hypertension, consistent with pulmonary vascular congestion. Small pleural effusions bilaterally. Widespread airspace opacity bilaterally may represent alveolar edema or pneumonia. Both entities may exist concurrently. A degree of underlying ARDS is also possible. The overall appearance is essentially stable compared to 1 day prior. Electronically Signed   By: [name].   On: 01/29/2018  07:45   Dg Chest Port 1 View  Result Date: 01/28/2018 CLINICAL DATA:  Pneumonia EXAM: PORTABLE CHEST 1 VIEW COMPARISON:  01/23/2018 FINDINGS: Tracheostomy and right PICC line remain in place, unchanged. Cardiomegaly. Vascular congestion and diffuse bilateral airspace opacities are again noted, unchanged. Low lung volumes. No visible effusions or acute bony abnormality. IMPRESSION: Cardiomegaly with vascular congestion and diffuse bilateral airspace disease which could reflect edema or infection. No change. Electronically Signed   By:  Charlett Nose M.D.   On: 01/28/2018 09:21   Dg Chest Port 1 View  Result Date: 01/23/2018 CLINICAL DATA:  Tracheostomy placement EXAM: PORTABLE CHEST 1 VIEW COMPARISON:  January 21, 2018 FINDINGS: Tracheostomy catheter tip is 4.1 cm above the carina. Central catheter tip is at the cavoatrial junction. No pneumothorax. There is persistent moderate interstitial and patchy alveolar edema with small pleural effusions bilaterally. Heart is mildly enlarged with pulmonary venous hypertension. No adenopathy. No bone lesions. IMPRESSION: Tube and catheter positions as described without pneumothorax. Evidence of a degree of congestive heart failure, similar to most recent prior study. Electronically Signed   By: [name].   On: 01/23/2018 12:06   Dg Chest Port 1 View  Result Date: 01/21/2018 CLINICAL DATA:  Hypoxia EXAM: PORTABLE CHEST 1 VIEW COMPARISON:  January 19, 2018 FINDINGS: Endotracheal tube tip is 3.7 cm above the carina. Nasogastric tube tip and side port are below the diaphragm. Central catheter tip is in the superior vena cava. No pneumothorax. There is moderate interstitial and patchy alveolar opacities, slightly increased. There is a small left pleural effusion. Heart is upper normal in size with pulmonary venous hypertension. No adenopathy. No bone lesions. IMPRESSION: Tube and catheter positions as described without pneumothorax. Increase interstitial and  patchy alveolar pulmonary edema, likely indicative of a degree of congestive heart failure. Small left pleural effusion. Underlying pulmonary vascular congestion. Electronically Signed   By: [name].   On: 01/21/2018 07:04   Dg Chest Port 1 View  Result Date: 01/19/2018 CLINICAL DATA:  Acute respiratory failure. EXAM: PORTABLE CHEST 1 VIEW COMPARISON:  01/18/2018 FINDINGS: Endotracheal tube terminates 4 cm above the carina. Enteric tube can be followed to the GE junction but is not well visualized beyond this due to under penetration in the upper abdomen. Right PICC terminates at the cavoatrial junction. The cardiomediastinal silhouette is unchanged allowing for differences in patient rotation. Interstitial and patchy airspace opacities are again seen bilaterally with mild improvement in the right base and without significant interval change on the left. No large pleural effusion or pneumothorax is identified. IMPRESSION: Bilateral interstitial and airspace opacities, slightly improved on the right and likely reflecting pneumonia. Electronically Signed   By: Sebastian Ache M.D.   On: 01/19/2018 07:49   Dg Chest Port 1 View  Result Date: 01/18/2018 CLINICAL DATA:  Oxygen desaturation EXAM: PORTABLE CHEST 1 VIEW COMPARISON:  01/18/2018 FINDINGS: Endotracheal tube, right PICC line and NG tube remain in place, unchanged. Bilateral interstitial and alveolar opacities are again noted, unchanged. Small bilateral effusions suspected. Heart is upper limits normal in size. IMPRESSION: Bilateral interstitial and airspace opacities with small effusions. No real change. Electronically Signed   By: Charlett Nose M.D.   On: 01/18/2018 16:39   Dg Chest Port 1 View  Result Date: 01/18/2018 CLINICAL DATA:  Acute respiratory failure. EXAM: PORTABLE CHEST 1 VIEW COMPARISON:  01/16/2018 and CT chest 01/06/2018. FINDINGS: Endotracheal tube terminates 4.9 cm above the carina. Nasogastric tube is followed into  the stomach. Right PICC tip projects over the SVC RA junction. Heart size within normal limits. Lungs are somewhat low in volume with diffuse mixed interstitial and airspace opacification, possibly mildly worsened from 01/16/2018. There may be right middle lobe and left lower lobe consolidation. No definite pleural fluid. IMPRESSION: Mixed interstitial and airspace opacification, possibly slightly worsened from 01/16/2018, with possible consolidation in the right middle and left lower lobes. Findings are indicative of persistent pneumonia, especially when  compared with 01/06/2018. Electronically Signed   By: Leanna Battles M.D.   On: 01/18/2018 08:12   Dg Chest Port 1 View  Result Date: 01/16/2018 CLINICAL DATA:  Hypoxia and acute respiratory failure. EXAM: PORTABLE CHEST 1 VIEW COMPARISON:  None. FINDINGS: Cardiomediastinal silhouette is unchanged. An endotracheal tube with tip 3.2 cm above the carina, NG tube entering the stomach, RIGHT PICC line with tip overlying the SUPERIOR cavoatrial junction are again noted. Interstitial/airspace opacities are again noted with slight improved aeration on the LEFT. Slightly increased RIGHT UPPER lobe atelectasis noted. There is no evidence of pneumothorax. IMPRESSION: Slightly increased RIGHT UPPER lobe atelectasis and slightly improved LEFT lung aeration. Otherwise no significant change. Electronically Signed   By: Harmon Pier M.D.   On: 01/16/2018 20:49   Dg Chest Port 1 View  Result Date: 01/13/2018 CLINICAL DATA:  Respiratory failure. EXAM: PORTABLE CHEST 1 VIEW COMPARISON:  One-view chest x-ray 01/12/2018. FINDINGS: Heart size is exaggerate by low lung volumes. Endotracheal tube is stable. Left greater than right interstitial and airspace disease is similar the prior exam. Overall lung volumes are slightly improved. IMPRESSION: 1. Stable appearance of left greater than right interstitial and airspace disease, likely a combination of edema and possible infection.  2. Support apparatus is stable. 3. Slight improvement in lung volumes. Electronically Signed   By: Marin Roberts M.D.   On: 01/13/2018 07:49   Dg Chest Port 1 View  Result Date: 01/12/2018 CLINICAL DATA:  Acute respiratory failure with hypoxia. Severe sepsis with septic shock. On ventilator. EXAM: PORTABLE CHEST 1 VIEW COMPARISON:  01/11/2018 FINDINGS: Endotracheal tube and nasogastric tube remain in appropriate position. Heart size remains within normal limits. Low lung volumes again seen with diffuse interstitial infiltrates. No evidence of focal consolidation or significant pleural effusion. IMPRESSION: Stable low lung volumes with diffuse interstitial infiltrates/edema. Electronically Signed   By: Myles Rosenthal M.D.   On: 01/12/2018 07:37   Dg Chest Port 1 View  Result Date: 01/11/2018 CLINICAL DATA:  Respiratory failure EXAM: PORTABLE CHEST 1 VIEW COMPARISON:  January 10, 2018 FINDINGS: The ETT terminates 2.8 cm above the carina. No pneumothorax. The cardiomediastinal silhouette is stable. Pulmonary edema has decreased but persists. No other changes. IMPRESSION: 1. Stable support apparatus. 2. Persistent but decreased pulmonary edema. 3. No other change. Electronically Signed   By: Gerome Sam III M.D   On: 01/11/2018 07:01   Dg Chest Port 1 View  Result Date: 01/10/2018 CLINICAL DATA:  Respiratory failure EXAM: PORTABLE CHEST 1 VIEW COMPARISON:  01/08/2018 FINDINGS: Cardiac shadow is stable. Endotracheal tube and nasogastric catheter are again noted in satisfactory position. Bilateral infiltrates are again identified and stable. No new focal abnormality is seen. No bony abnormality is noted. IMPRESSION: Stable bilateral infiltrates. Electronically Signed   By: Alcide Clever M.D.   On: 01/10/2018 07:24   Dg Chest Port 1 View  Result Date: 01/08/2018 CLINICAL DATA:  Respiratory failure EXAM: PORTABLE CHEST 1 VIEW COMPARISON:  Portable exam 1003 hours compared to 01/07/2018 FINDINGS: Tip of  endotracheal tube projects 4.4 cm above carina. Nasogastric tube extends into abdomen. Enlargement of cardiac silhouette. Diffuse BILATERAL airspace infiltrates increased in upper lobes since previous exam. No gross pleural effusion or pneumothorax. IMPRESSION: Increased BILATERAL airspace infiltrates. Electronically Signed   By: Ulyses Southward M.D.   On: 01/08/2018 10:33   Dg Chest Port 1 View  Result Date: 01/07/2018 CLINICAL DATA:  Acute respiratory failure.  Hypoxia. EXAM: PORTABLE CHEST 1 VIEW COMPARISON:  CT  6 hours prior, radiographs 8 hours prior. FINDINGS: Endotracheal tube 3.9 cm from the carina. Enteric tube in place with tip below the diaphragm, not included in the field of view. Progressive bilateral lung opacities, greatest in the lung bases with air bronchograms. Heart appears normal in size. No large pleural effusion. No pneumothorax. IMPRESSION: Progressive bilateral lung opacities, greatest in the lung bases, may reflect pneumonia or aspiration. Pulmonary edema is felt less likely. Electronically Signed   By: Rubye Oaks M.D.   On: 01/07/2018 04:01   Dg Chest Portable 1 View  Result Date: 01/06/2018 CLINICAL DATA:  ETT placement EXAM: PORTABLE CHEST 1 VIEW COMPARISON:  None. FINDINGS: Endotracheal tube tip is about 2.4 cm superior to the carina. Low lung volumes. No pleural effusion or pneumothorax. Heart size within normal limits. Streaky left greater than right hilar opacity. IMPRESSION: 1. Endotracheal tube tip about 2.4 cm superior to the carina 2. Low lung volumes. Streaky left greater than right perihilar opacity of uncertain chronicity. Electronically Signed   By: Jasmine Pang M.D.   On: 01/06/2018 19:35   [place] Ekg Site Rite  Result Date: 01/13/2018 If Site Rite image not attached, placement could not be confirmed due to current cardiac rhythm.   Consults: Treatment Team:  Marcina Millard, MD Mick Sell, MD   Subjective:    Overnight patient had worsening  hypoxemia requiring increasing FiO2 to 100% and increasing PEEP to 10. Patient's chest x-ray shows worsening airspace disease consistent with ARDS  Objective:  Vital signs for last 24 hours: Temp:  [97.9 F (36.6 C)-100.4 F (38 C)] 99.3 F (37.4 C) (05/08 0700) Pulse Rate:  [58-77] 60 (05/08 0700) Resp:  [20-37] 25 (05/08 0700) BP: (98-163)/(55-129) 130/59 (05/08 0700) SpO2:  [87 %-100 %] 99 % (05/08 0700) FiO2 (%):  [50 %-100 %] 100 % (05/08 0440)  Hemodynamic parameters for last 24 hours:    Intake/Output from previous day: 05/07 0701 - 05/08 0700 In: 3253 [I.V.:628; ZO/XW:9604; IV Piggyback:600] Out: 3250 [Urine:3250]  Intake/Output this shift: No intake/output data recorded.  Vent settings for last 24 hours: Vent Mode: PRVC FiO2 (%):  [50 %-100 %] 100 % Set Rate:  [14 bmp] 14 bmp Vt Set:  [500 mL] 500 mL PEEP:  [5 cmH20-10 cmH20] (P) 10 cmH20 Plateau Pressure:  [22 cmH20] 22 cmH20  Physical Exam:  Patient is on mechanical ventilation through tracheostomy. Appears to track and minimally respond Vital signs: Please see the above listed vital signs HEENT: Tracheostomy in place with mechanical ventilation, trachea is midline, mild increased work of breathing, no noted jugular venous distention Cardiovascular: Regular rate and rhythm,  Pulmonary: Coarse expiratory rhonchi noted, inspiratory crackles appreciated bilateral Abdominal: PEG tube noted to be in place, mild distention, bowel sounds appreciated Extremity: Edema noted Neurologic: Patient moves extremities, appears to track  Assessment/Plan:   Patient 57 year old gentleman with traumatic brain injury with acute respiratory failure, severe bilateral pneumonia aspiration, sepsis. Last evening worsening airspace disease requiring increasing PEEP and FiO2. Clinical center I on chest x-ray consistent with severe ARDS. We'll try to diurese if blood pressure is not prohibitive. Presently being followed by infectious  disease, on meropenem, vancomycin. Positive blood cultures for Enterobacter and Klebsiella.  History of atrial fibrillation. On amiodarone And systemic anticoagulation  History of dilated cardiomyopathy with ejection fraction 20%  Anemia. No clear evidence of active bleeding. Hemoglobin is 8.0  Leukocytosis. White count is 17.4.     Critical Care Total Time: 40 minutes  Rindy Kollman 02/05/2018  *  Care during the described time interval was provided by me and/or other providers on the critical care team.  I have reviewed this patient's available data, including medical history, events of note, physical examination and test results as part of my evaluation. Patient ID: Tommy Mathews, male   DOB: 1960/10/23, 57 y.o.   MRN: [id number] Patient ID: Tommy Mathews, male   DOB: [birth year], 57 y.o.   MRN: [id number]

## 2018-02-05 NOTE — Progress Notes (Signed)
[facility] INFECTIOUS DISEASE PROGRESS NOTE Date of Admission:  01/06/2018     ID: Tommy Mathews is a 57 y.o. male with fever Principal Problem:   Severe sepsis with septic shock ([place]) Active Problems:   GI bleed   Acute respiratory failure with hypoxia (HCC)   Multifocal pneumonia   UTI (urinary tract infection)   AKI (acute kidney injury) ([place])   Ileus (HCC)   Diabetes ([place])   Pressure injury of skin   Subjective: Low grade fevers, wbc up some. Not on pressores  ROS  Unable to obtain Medications:  Antibiotics Given (last 72 hours)    Date/Time Action Medication Dose Rate   02/02/18 2033 New Bag/Given   vancomycin (VANCOCIN) 1,500 mg in sodium chloride 0.9 % 500 mL IVPB 1,500 mg 250 mL/hr   02/02/18 2313 New Bag/Given   meropenem (MERREM) 1 g in sodium chloride 0.9 % 100 mL IVPB 1 g 200 mL/hr   02/03/18 0513 New Bag/Given   meropenem (MERREM) 1 g in sodium chloride 0.9 % 100 mL IVPB 1 g 200 mL/hr   02/03/18 1432 New Bag/Given   meropenem (MERREM) 1 g in sodium chloride 0.9 % 100 mL IVPB 1 g 200 mL/hr   02/03/18 2012 New Bag/Given   vancomycin (VANCOCIN) 1,500 mg in sodium chloride 0.9 % 500 mL IVPB 1,500 mg 250 mL/hr   02/03/18 2204 New Bag/Given   meropenem (MERREM) 1 g in sodium chloride 0.9 % 100 mL IVPB 1 g 200 mL/hr   02/04/18 0512 New Bag/Given   meropenem (MERREM) 1 g in sodium chloride 0.9 % 100 mL IVPB 1 g 200 mL/hr   02/04/18 0814 New Bag/Given   vancomycin (VANCOCIN) 1,500 mg in sodium chloride 0.9 % 500 mL IVPB 1,500 mg 250 mL/hr   02/04/18 1436 New Bag/Given   meropenem (MERREM) 1 g in sodium chloride 0.9 % 100 mL IVPB 1 g 200 mL/hr   02/04/18 2209 New Bag/Given   vancomycin (VANCOCIN) 1,500 mg in sodium chloride 0.9 % 500 mL IVPB 1,500 mg 250 mL/hr   02/04/18 2300 New Bag/Given   meropenem (MERREM) 1 g in sodium chloride 0.9 % 100 mL IVPB 1 g 200 mL/hr   02/05/18 0520 New Bag/Given   meropenem (MERREM) 1 g in sodium chloride 0.9 % 100 mL IVPB 1 g 200  mL/hr   02/05/18 1122 New Bag/Given   vancomycin (VANCOCIN) 1,500 mg in sodium chloride 0.9 % 500 mL IVPB 1,500 mg 250 mL/hr   02/05/18 1408 New Bag/Given   meropenem (MERREM) 1 g in sodium chloride 0.9 % 100 mL IVPB 1 g 200 mL/hr     . amiodarone  400 mg Per Tube BID  . atorvastatin  80 mg Per Tube q1800  . budesonide (PULMICORT) nebulizer solution  0.5 mg Nebulization BID  . chlorhexidine gluconate (MEDLINE KIT)  15 mL Mouth Rinse BID  . clonazePAM  1 mg Per Tube BID  . digoxin  0.25 mg Per Tube Daily  . fentaNYL  100 mcg Transdermal Q72H  . FLUoxetine  40 mg Per Tube Daily  . free water  200 mL Per Tube Q8H  . furosemide  40 mg Intravenous Daily  . gabapentin  600 mg Per Tube Q8H  . hydrochlorothiazide  25 mg Oral Daily  . insulin aspart  0-20 Units Subcutaneous Q4H  . insulin glargine  20 Units Subcutaneous QHS  . ipratropium-albuterol  3 mL Nebulization Q8H  . lisinopril  5 mg Per Tube Daily  .  mouth rinse  15 mL Mouth Rinse 10 times per day  . metoCLOPramide  10 mg Oral Q12H  . metoprolol tartrate  25 mg Per Tube Q6H  . nutrition supplement (JUVEN)  1 packet Per Tube BID  . nystatin  5 mL Oral QID  . pantoprazole sodium  40 mg Per Tube Daily  . Valproate Sodium  1,000 mg Per Tube BID    Objective: Vital signs in last 24 hours: Temp:  [97.2 F (36.2 C)-99.9 F (37.7 C)] 97.7 F (36.5 C) (05/08 1330) Pulse Rate:  [57-77] 59 (05/08 1330) Resp:  [21-37] 24 (05/08 1330) BP: (90-135)/(49-85) 102/51 (05/08 1330) SpO2:  [87 %-99 %] 96 % (05/08 1330) FiO2 (%):  [50 %-100 %] 80 % (05/08 1200) Constitutional: lethargic, HENT: trach in place, anicteric Mouth/Throat: Oropharynx is clear and moist.  Cardiovascular: Normal rate, regular rhythm and normal heart sounds.  Pulmonary/Chest: rhonchi bil Abdominal: Soft. Bowel sounds are normal. He exhibits no distension. There is no tenderness.  PEG Site wnl Lymphadenopathy: He has no cervical adenopathy.  Neurological:  lethargic Skin: Skin is warm and dry. No rash noted. No erythema.  Psychiatric: trached PICC RUE WNL    Lab Results Recent Labs    02/03/18 0441 02/04/18 0835 02/05/18 0938  WBC 12.1* 17.4*  --   HGB 7.6* 8.0*  --   HCT 23.4* 25.2*  --   NA 139 137 137  K 4.1 3.9 3.6  CL 97* 97* 96*  CO2 38* 36* 35*  BUN _0 CREATININE 0.71 0.68 0.62    Microbiology: _1 @ Studies/Results: Dg Chest Port 1 View  Result Date: 02/05/2018 CLINICAL DATA:  57 year old male with acute respiratory failure. EXAM: PORTABLE CHEST 1 VIEW COMPARISON:  Chest radiograph dated 02/04/2018 FINDINGS: Right-sided PICC in similar position. Tracheostomy above the carina. Diffuse airspace opacity throughout the lungs with no significant interval change compared to the prior radiograph. No pneumothorax. The cardiac borders are silhouetted. No acute osseous pathology. IMPRESSION: No significant interval change. Electronically Signed   By: Anner Crete M.D.   On: 02/05/2018 06:22   Dg Chest Port 1 View  Result Date: 02/04/2018 CLINICAL DATA:  Acute respiratory failure, pulmonary edema, sepsis, hypertension, diabetes, current smoker. EXAM: PORTABLE CHEST 1 VIEW COMPARISON:  Portable chest x-ray of Feb 03, 2018 FINDINGS: Diffuse airspace opacities have worsened bilaterally. The cardiac silhouette is mildly enlarged. The pulmonary vascularity is indistinct. There is calcification in the wall of the aortic arch. There may be pleural fluid layering posteriorly on the left. The tracheostomy tube tip projects between the clavicular heads. The right sided PICC line tip projects over the midportion of the SVC. IMPRESSION: Worsening airspace opacities bilaterally consistent with progressive pneumonia or pulmonary edema. Thoracic aortic atherosclerosis. Electronically Signed   By:   [place] M.D.   On: 02/04/2018 10:28    Assessment/Plan: Tommy Mathews is a 57 y.o. 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 He has had a prolonged admission and is now trached and has PEG. He was treated with IV abx until 4/15 then again 4/20-21. Was then off of abx until developed fever 5/2 with wbc up some to 12. CXR however did not show any new opacities but has increasing resp requirements and increased sputum.  PICC line RUE changed. Prior sputum cx with Nml flora 4/20. Prior bcx neg. FU BCX and sputum pending.  5/6- defervesced, bcx neg,  sputum cx negative. WBC down 17->12. Day 4 of vanco and mero. Imaging shows no change in severe patchy opacities bilaterally.  5/8 had fever and increased wbc. cx now + klebsiellafrom 5/3 Recommendations Cont  for 8 days total - stop date 5/10  PICC line has been changed initially  Follow clinically  Consider adding back fungal coverage if develops sepsis Thank you very much for the consult. Will follow with you.  Leonel Ramsay   02/05/2018, 4:04 PM

## 2018-02-05 NOTE — Progress Notes (Signed)
OT Cancellation Note  Patient Details Name: Tommy Mathews MRN: [id number] DOB: May 15, 1961   Cancelled Treatment:    Reason Eval/Treat Not Completed: Medical issues which prohibited therapy. Spoke with PT who discussed pt's status with nursing; pt currently on Precedex drip and not currently appropriate to work with therapy.  Will re-attempt OT treatment session at a later date/time as pt is medically appropriate.   Richrd Prime, MPH, MS, OTR/L ascom [phone] 02/05/18, 10:17 AM

## 2018-02-05 NOTE — Progress Notes (Signed)
PHARMACY - PHYSICIAN COMMUNICATION CRITICAL VALUE ALERT - BLOOD CULTURE IDENTIFICATION (BCID)  Tommy Mathews is an 57 y.o. male who presented to [facility] on 01/06/2018 with a chief complaint of originally here for endoscopy, ending up de-satting and developed septic shock s/t pneumonia  Assessment:  WBC 13.1 >> 12.1 >> 17.4, CXR worsening opacities consistent w/ PNA/pulm edema, MRSA PCR +, 2/4 GNR, trach asp showing rare GPC/budding yeast.  Name of physician (or Provider) Contacted: Sonda Rumble  Current antibiotics: Vancomycin, meropenem  Changes to prescribed antibiotics recommended:  Patient is on recommended antibiotics - No changes needed -- considering patient is still critical with increase in WBC, positive MRSA PCR and GNR, will continue current abx and will f/u on Cx/Sx's  Results for orders placed or performed during the hospital encounter of 01/06/18  Blood Culture ID Panel (Reflexed) (Collected: 01/31/2018  8:17 AM)  Result Value Ref Range   Enterococcus species NOT DETECTED NOT DETECTED   Listeria monocytogenes NOT DETECTED NOT DETECTED   Staphylococcus species NOT DETECTED NOT DETECTED   Staphylococcus aureus NOT DETECTED NOT DETECTED   Streptococcus species NOT DETECTED NOT DETECTED   Streptococcus agalactiae NOT DETECTED NOT DETECTED   Streptococcus pneumoniae NOT DETECTED NOT DETECTED   Streptococcus pyogenes NOT DETECTED NOT DETECTED   Acinetobacter baumannii NOT DETECTED NOT DETECTED   Enterobacteriaceae species DETECTED (A) NOT DETECTED   Enterobacter cloacae complex NOT DETECTED NOT DETECTED   Escherichia coli NOT DETECTED NOT DETECTED   Klebsiella oxytoca NOT DETECTED NOT DETECTED   Klebsiella pneumoniae DETECTED (A) NOT DETECTED   Proteus species NOT DETECTED NOT DETECTED   Serratia marcescens NOT DETECTED NOT DETECTED   Carbapenem resistance NOT DETECTED NOT DETECTED   Haemophilus influenzae NOT DETECTED NOT DETECTED   Neisseria meningitidis NOT DETECTED NOT  DETECTED   Pseudomonas aeruginosa NOT DETECTED NOT DETECTED   Candida albicans NOT DETECTED NOT DETECTED   Candida glabrata NOT DETECTED NOT DETECTED   Candida krusei NOT DETECTED NOT DETECTED   Candida parapsilosis NOT DETECTED NOT DETECTED   Candida tropicalis NOT DETECTED NOT DETECTED   Thomasene Ripple, PharmD, BCPS Clinical Pharmacist 02/05/2018

## 2018-02-05 NOTE — Progress Notes (Signed)
Patient ID: Tommy Mathews, male   DOB: 1960/12/30, 57 y.o.   MRN: [id number]  [employer] [facility] PROGRESS NOTE  Tommy Mathews AYT:[id number] DOB: 12-02-1960 DOA: 01/06/2018 PCP: Isaias Cowman, MD  HPI/Subjective: Patient continues to be on ventilator  Objective: Vitals:   02/05/18 1300 02/05/18 1330  BP: (!) 90/49 (!) 102/51  Pulse: 67 (!) 59  Resp: (!) 27 (!) 24  Temp: 98.1 F (36.7 C) 97.7 F (36.5 C)  SpO2: 91% 96%    Filed Weights   01/19/18 0347 01/20/18 1200 01/26/18 0418  Weight: (!) 136.7 kg (301 lb 5.9 oz) (!) 139.5 kg (307 lb 8.7 oz) 133.2 kg (293 lb 10.4 oz)    ROS: Review of Systems  Unable to perform ROS: Acuity of condition   Exam: Physical Exam  Constitutional: He appears lethargic.  HENT:  Nose: No mucosal edema.  Eyes: Pupils are equal, round, and reactive to light. Conjunctivae and lids are normal.  Neck: Carotid bruit is not present. No thyroid mass present.  Cardiovascular: Regular rhythm, S1 normal, S2 normal and normal heart sounds.  Respiratory: He has decreased breath sounds in the right middle field, the right lower field, the left middle field and the left lower field. He has no wheezes. He has rhonchi in the right middle field, the right lower field, the left middle field and the left lower field. He has no rales.  GI: Soft. Bowel sounds are normal. There is tenderness.  Genitourinary:  Genitourinary Comments: 4+ scrotal edema, penile edema  Musculoskeletal:       Right ankle: He exhibits swelling.       Left ankle: He exhibits swelling.  Neurological: He appears lethargic.  Skin: Skin is warm. No rash noted. Nails show no clubbing.  Psychiatric: His affect is blunt.      Data Reviewed: Basic Metabolic Panel: Recent Labs  Lab 01/30/18 0458 01/31/18 0818 02/01/18 0411 02/01/18 2007 02/02/18 3557 02/03/18 0441 02/04/18 0835 02/05/18 0938  NA 138 136 139  --  139 139 137 137  K 3.7 3.7 3.1* 3.2* 3.6 4.1 3.9 3.6  CL 92* 91* 92*   --  98* 97* 97* 96*  CO2 40* 38* 42*  --  37* 38* 36* 35*  GLUCOSE 130* 107* 86  --  125* 185* 174* 182*  BUN 14 18 22*  --  _0 CREATININE 0.71 0.89 0.81  --  0.62 0.71 0.68 0.62  CALCIUM 8.1* 8.2* 7.8*  --  7.5* 7.9* 8.1* 7.9*  MG 1.8 1.4* 2.3  --  1.8  --   --   --   PHOS 3.2 3.2 4.2  --  3.9  --   --   --    Liver Function Tests: Recent Labs  Lab 01/30/18 0458 02/01/18 0411  AST 33 34  ALT 21 23  ALKPHOS 68 81  BILITOT 0.3 0.8  PROT 5.8* 5.6*  ALBUMIN 2.0* 1.8*   CBC: Recent Labs  Lab 01/31/18 0818 02/01/18 0411 02/02/18 0433 02/03/18 0441 02/04/18 0835  WBC 12.6* 17.5* 13.1* 12.1* 17.4*  NEUTROABS 12.4* 13.7* 9.5* 9.2* 12.7*  HGB 9.3* 7.5* 7.3* 7.6* 8.0*  HCT 27.2* 23.0* 22.2* 23.4* 25.2*  MCV 83.7 84.7 84.8 85.0 85.9  PLT 273 236 273 370 452*   BNP (last 3 results) Recent Labs    01/06/18 1944 01/16/18 2332  BNP 37.0 402.0*     CBG: Recent Labs  Lab 02/04/18 2321 02/05/18 0315 02/05/18 0449 02/05/18 0738 02/05/18  [place] 190* 174* 160* 186*     Studies: Dg Chest Port 1 View  Result Date: 02/05/2018 CLINICAL DATA:  57 year old male with acute respiratory failure. EXAM: PORTABLE CHEST 1 VIEW COMPARISON:  Chest radiograph dated 02/04/2018 FINDINGS: Right-sided PICC in similar position. Tracheostomy above the carina. Diffuse airspace opacity throughout the lungs with no significant interval change compared to the prior radiograph. No pneumothorax. The cardiac borders are silhouetted. No acute osseous pathology. IMPRESSION: No significant interval change. Electronically Signed   By: Anner Crete M.D.   On: 02/05/2018 06:22   Dg Chest Port 1 View  Result Date: 02/04/2018 CLINICAL DATA:  Acute respiratory failure, pulmonary edema, sepsis, hypertension, diabetes, current smoker. EXAM: PORTABLE CHEST 1 VIEW COMPARISON:  Portable chest x-ray of Feb 03, 2018 FINDINGS: Diffuse airspace opacities have worsened bilaterally. The cardiac  silhouette is mildly enlarged. The pulmonary vascularity is indistinct. There is calcification in the wall of the aortic arch. There may be pleural fluid layering posteriorly on the left. The tracheostomy tube tip projects between the clavicular heads. The right sided PICC line tip projects over the midportion of the SVC. IMPRESSION: Worsening airspace opacities bilaterally consistent with progressive pneumonia or pulmonary edema. Thoracic aortic atherosclerosis. Electronically Signed   By: David  Martinique M.D.   On: 02/04/2018 10:28    Scheduled Meds: . amiodarone  400 mg Per Tube BID  . atorvastatin  80 mg Per Tube q1800  . budesonide (PULMICORT) nebulizer solution  0.5 mg Nebulization BID  . chlorhexidine gluconate (MEDLINE KIT)  15 mL Mouth Rinse BID  . clonazePAM  1 mg Per Tube BID  . digoxin  0.25 mg Per Tube Daily  . fentaNYL  100 mcg Transdermal Q72H  . FLUoxetine  40 mg Per Tube Daily  . free water  200 mL Per Tube Q8H  . furosemide  40 mg Intravenous Daily  . gabapentin  600 mg Per Tube Q8H  . hydrochlorothiazide  25 mg Oral Daily  . insulin aspart  0-20 Units Subcutaneous Q4H  . insulin glargine  20 Units Subcutaneous QHS  . ipratropium-albuterol  3 mL Nebulization Q8H  . lisinopril  5 mg Per Tube Daily  . mouth rinse  15 mL Mouth Rinse 10 times per day  . metoCLOPramide  10 mg Oral Q12H  . metoprolol tartrate  25 mg Per Tube Q6H  . nutrition supplement (JUVEN)  1 packet Per Tube BID  . nystatin  5 mL Oral QID  . pantoprazole sodium  40 mg Per Tube Daily  . Valproate Sodium  1,000 mg Per Tube BID   Continuous Infusions: . sodium chloride    . dexmedetomidine (PRECEDEX) IV infusion 0.6 mcg/kg/hr (02/05/18 1407)  . feeding supplement (GLUCERNA 1.5 CAL) 1,000 mL (02/05/18 1053)  . meropenem (MERREM) IV 1 g (02/05/18 1408)  . vancomycin Stopped (02/05/18 1322)    Assessment/Plan:  1. Ventilator associated pneumonia continue current antibiotics  2. acute hypoxic respiratory  failure.   Continue ventilator 3. Atrial fibrillation with rapid ventricular response, episodes of SVT.  Patient on amiodarone, digoxin and metoprolol.   4. Acute on chronic systolic congestive heart failure with anasarca and scrotal edema.  On IV Lasix daily 5. Acute encephalopathy.   history of traumatic brain injury. 6. Previous septic shock and pneumonia treated already. 7. Gastrointestinal bleed. on PPI.  Endoscopy negative.   Xarelto stopped and patient was given another unit of blood.  Continue to monitor renal function 8. Acute kidney injury resolved.  9. Type 2 diabetes mellitus on glargine insulin and sliding scale 10. Nutrition on tube feeding per intensivist 11. Depression on Prozac and Depakene. 12. Thrush on nystatin status post treatment 13. Hyperlipidemia unspecified on atorvastatin  Overall prognosis poor patient has not shown much improvement  Code Status:     Code Status Orders  (From admission, onward)        Start     Ordered   01/06/18 2227  Full code  Continuous     01/06/18 2226    Code Status History    This patient has a current code status but no historical code status.     Family Communication:   mother and patient's sister at the bedside Disposition Plan:  will need long-term placement on a facility that can handle a ventilator  Consultants:  Critical care specialist  Cardiology  Time spent: 28 minutes     [facility] [employer]            

## 2018-02-05 NOTE — Progress Notes (Signed)
PT Cancellation Note  Patient Details Name: Tommy Mathews MRN: [id number] DOB: Aug 04, 1961   Cancelled Treatment:    Reason Eval/Treat Not Completed: Patient not medically ready.  Discussed pt's status with nursing; pt currently on Precedex drip and not currently appropriate to work with therapy.  Will re-attempt PT treatment session at a later date/time.  Hendricks Limes, PT 02/05/18, 10:01 AM [phone]

## 2018-02-05 NOTE — Clinical Social Work Note (Signed)
CSW received call from [place] at [facility]: [phone]. She confirmed that they cannot take patient while on precedex and that when he is off of precedex, to re-send updated information for review. She states that she may have a bed at that time. York Spaniel MSW,LCSW [phone]

## 2018-02-06 ENCOUNTER — Inpatient Hospital Stay: Payer: Medicaid Other

## 2018-02-06 LAB — BASIC METABOLIC PANEL
ANION GAP: 5 (ref 5–15)
BUN: 18 mg/dL (ref 6–20)
CO2: 36 mmol/L — ABNORMAL HIGH (ref 22–32)
Calcium: 7.8 mg/dL — ABNORMAL LOW (ref 8.9–10.3)
Chloride: 98 mmol/L — ABNORMAL LOW (ref 101–111)
Creatinine, Ser: 0.55 mg/dL — ABNORMAL LOW (ref 0.61–1.24)
GFR calc Af Amer: >60 mL/min (ref >60)
GLUCOSE: 165 mg/dL — AB (ref 65–99)
POTASSIUM: 3.9 mmol/L (ref 3.5–5.1)
SODIUM: 139 mmol/L (ref 135–145)

## 2018-02-06 LAB — CBC
HCT: 22.8 % — ABNORMAL LOW (ref 40.0–52.0)
HEMOGLOBIN: 7.2 g/dL — AB (ref 13.0–18.0)
MCH: 27.2 pg (ref 26.0–34.0)
MCHC: 31.8 g/dL — ABNORMAL LOW (ref 32.0–36.0)
MCV: 85.6 fL (ref 80.0–100.0)
Platelets: 467 10*3/uL — ABNORMAL HIGH (ref 150–440)
RBC: 2.66 MIL/uL — ABNORMAL LOW (ref 4.40–5.90)
RDW: 18.6 % — ABNORMAL HIGH (ref 11.5–14.5)
WBC: 15.5 10*3/uL — ABNORMAL HIGH (ref 3.8–10.6)

## 2018-02-06 LAB — GLUCOSE, CAPILLARY
GLUCOSE-CAPILLARY: 167 mg/dL — AB (ref 65–99)
GLUCOSE-CAPILLARY: 166 mg/dL — AB (ref 65–99)
GLUCOSE-CAPILLARY: 158 mg/dL — AB (ref 65–99)
Glucose-Capillary: 159 mg/dL — ABNORMAL HIGH (ref 65–99)
Glucose-Capillary: 162 mg/dL — ABNORMAL HIGH (ref 65–99)
Glucose-Capillary: 155 mg/dL — ABNORMAL HIGH (ref 65–99)

## 2018-02-06 NOTE — Progress Notes (Signed)
Patient ID: Tommy Mathews, male   DOB: January 02, 1961, 57 y.o.   MRN: [id number]  [facility] PROGRESS NOTE  Lige Lakeman LTJ:[id number] DOB: [birth year] DOA: 01/06/2018 PCP: Isaias Cowman, MD  HPI/Subjective: Patient continues to be on ventilator  Objective: Vitals:   02/06/18 1200 02/06/18 1402  BP: 125/70   Pulse: (!) 53 (!) 59  Resp: (!) 28   Temp:    SpO2: 95%     Filed Weights   01/19/18 0347 01/20/18 1200 01/26/18 0418  Weight: (!) 136.7 kg (301 lb 5.9 oz) (!) 139.5 kg (307 lb 8.7 oz) 133.2 kg (293 lb 10.4 oz)    ROS: Review of Systems  Unable to perform ROS: Acuity of condition   Exam: Physical Exam  Constitutional:  Intubated  HENT:  Nose: No mucosal edema.  Eyes: Pupils are equal, round, and reactive to light. Conjunctivae and lids are normal.  Neck: Carotid bruit is not present. No thyroid mass present.  Cardiovascular: Regular rhythm, S1 normal, S2 normal and normal heart sounds.  Respiratory: He has decreased breath sounds in the right middle field, the right lower field, the left middle field and the left lower field. He has no wheezes. He has rhonchi in the right middle field, the right lower field, the left middle field and the left lower field. He has no rales.  GI: Soft. Bowel sounds are normal. There is tenderness.  Genitourinary:  Genitourinary Comments: 4+ scrotal edema, penile edema  Musculoskeletal:       Right ankle: He exhibits swelling.       Left ankle: He exhibits swelling.  Skin: Skin is warm. No rash noted. Nails show no clubbing.  Psychiatric: His affect is blunt.      Data Reviewed: Basic Metabolic Panel: Recent Labs  Lab 01/31/18 0818 02/01/18 0411 02/01/18 2007 02/02/18 0762 02/03/18 0441 02/04/18 0835 02/05/18 0938  NA 136 139  --  139 139 137 137  K 3.7 3.1* 3.2* 3.6 4.1 3.9 3.6  CL 91* 92*  --  98* 97* 97* 96*  CO2 38* 42*  --  37* 38* 36* 35*  GLUCOSE 107* 86  --  125* 185* 174* 182*  BUN 18 22*  --  _0 CREATININE 0.89 0.81  --  0.62 0.71 0.68 0.62  CALCIUM 8.2* 7.8*  --  7.5* 7.9* 8.1* 7.9*  MG 1.4* 2.3  --  1.8  --   --   --   PHOS 3.2 4.2  --  3.9  --   --   --    Liver Function Tests: Recent Labs  Lab 02/01/18 0411  AST 34  ALT 23  ALKPHOS 81  BILITOT 0.8  PROT 5.6*  ALBUMIN 1.8*   CBC: Recent Labs  Lab 01/31/18 0818 02/01/18 0411 02/02/18 0433 02/03/18 0441 02/04/18 0835 02/06/18 0430  WBC 12.6* 17.5* 13.1* 12.1* 17.4* 15.5*  NEUTROABS 12.4* 13.7* 9.5* 9.2* 12.7*  --   HGB 9.3* 7.5* 7.3* 7.6* 8.0* 7.2*  HCT 27.2* 23.0* 22.2* 23.4* 25.2* 22.8*  MCV 83.7 84.7 84.8 85.0 85.9 85.6  PLT 273 236 273 370 452* 467*   BNP (last 3 results) Recent Labs    01/06/18 1944 01/16/18 2332  BNP 37.0 402.0*     CBG: Recent Labs  Lab 02/05/18 2340 02/06/18 0322 02/06/18 0743 02/06/18 1141 02/06/18 1319  GLUCAP 173* 167* 159* 162* 166*     Studies: Dg Chest Port 1 View  Result Date: 02/06/2018 CLINICAL DATA:  Intubated patient with sepsis, pneumonia and possible aspiration. EXAM: PORTABLE CHEST 1 VIEW COMPARISON:  Single-view of the chest 02/05/2018 and 02/04/2018. FINDINGS: Tracheostomy tube and right PICC remain in place. Diffuse bilateral airspace disease is unchanged. No pneumothorax or pleural effusion. IMPRESSION: No change in diffuse bilateral airspace disease. Electronically Signed   By: Inge Rise M.D.   On: 02/06/2018 11:04   Dg Chest Port 1 View  Result Date: 02/05/2018 CLINICAL DATA:  57 year old male with acute respiratory failure. EXAM: PORTABLE CHEST 1 VIEW COMPARISON:  Chest radiograph dated 02/04/2018 FINDINGS: Right-sided PICC in similar position. Tracheostomy above the carina. Diffuse airspace opacity throughout the lungs with no significant interval change compared to the prior radiograph. No pneumothorax. The cardiac borders are silhouetted. No acute osseous pathology. IMPRESSION: No significant interval change. Electronically Signed   By:  Anner Crete M.D.   On: 02/05/2018 06:22    Scheduled Meds: . amiodarone  400 mg Per Tube BID  . atorvastatin  80 mg Per Tube q1800  . budesonide (PULMICORT) nebulizer solution  0.5 mg Nebulization BID  . chlorhexidine gluconate (MEDLINE KIT)  15 mL Mouth Rinse BID  . clonazePAM  1 mg Per Tube BID  . digoxin  0.25 mg Per Tube Daily  . fentaNYL  100 mcg Transdermal Q72H  . FLUoxetine  40 mg Per Tube Daily  . free water  200 mL Per Tube Q8H  . furosemide  40 mg Intravenous Daily  . gabapentin  600 mg Per Tube Q8H  . hydrochlorothiazide  25 mg Oral Daily  . insulin aspart  0-20 Units Subcutaneous Q4H  . insulin glargine  20 Units Subcutaneous QHS  . ipratropium-albuterol  3 mL Nebulization Q8H  . lisinopril  5 mg Per Tube Daily  . mouth rinse  15 mL Mouth Rinse 10 times per day  . metoCLOPramide  10 mg Oral Q12H  . metoprolol tartrate  25 mg Per Tube Q6H  . nutrition supplement (JUVEN)  1 packet Per Tube BID  . nystatin  5 mL Oral QID  . pantoprazole sodium  40 mg Per Tube Daily  . Valproate Sodium  1,000 mg Per Tube BID   Continuous Infusions: . sodium chloride    . dexmedetomidine (PRECEDEX) IV infusion 1.2 mcg/kg/hr (02/06/18 1310)  . feeding supplement (GLUCERNA 1.5 CAL) 1,000 mL (02/06/18 0445)  . meropenem (MERREM) IV Stopped (02/06/18 1351)  . vancomycin Stopped (02/06/18 1119)    Assessment/Plan:  1. Ventilator associated pneumonia continue current antibiotics  2. acute hypoxic respiratory failure.   Continue ventilator 3. Atrial fibrillation with rapid ventricular response, episodes of SVT.  Patient on amiodarone, digoxin and metoprolol.   4. Acute on chronic systolic congestive heart failure with anasarca and scrotal edema.  On IV Lasix daily 5. Acute encephalopathy.   history of traumatic brain injury. 6. Previous septic shock and pneumonia treated already. 7. Gastrointestinal bleed. on PPI.  Endoscopy negative.   Xarelto stopped and patient was given another  unit of blood.  Continue to monitor renal function 8. Acute kidney injury resolved. 9. Type 2 diabetes mellitus on glargine insulin and sliding scale 10. Nutrition on tube feeding per intensivist 11. Depression on Prozac and Depakene. 12. Thrush on nystatin status post treatment 13. Hyperlipidemia unspecified on atorvastatin  Overall prognosis poor patient has not shown much improvement  Code Status:     Code Status Orders  (From admission, onward)        Start     Ordered  01/06/18 2227  Full code  Continuous     01/06/18 2226    Code Status History    This patient has a current code status but no historical code status.     Family Communication:   mother and patient's sister at the bedside Disposition Plan:  will need long-term placement on a facility that can handle a ventilator  Consultants:  Critical care specialist  Cardiology  Time spent: 28 minutes  Fransico Sciandra [facility] [employer]

## 2018-02-06 NOTE — Progress Notes (Signed)
   02/06/18 1100  Clinical Encounter Type  Visited With Patient  Visit Type Follow-up  Spiritual Encounters  Spiritual Needs Prayer   Chaplain offered silent prayer at patient's bedside.

## 2018-02-06 NOTE — Progress Notes (Signed)
Pharmacy Electrolyte Monitoring Consult:  Pharmacy consulted to assist in monitoring and replacing electrolytes in this 57 y.o. male admitted on 01/06/2018   Patient requiring mechanical ventilation and has PEG tube.   Patient ordered furosemide  IV daily.  Labs:  Sodium (mmol/L)  Date Value  02/06/2018 139   Potassium (mmol/L)  Date Value  02/06/2018 3.9   Magnesium (mg/dL)  Date Value  40/98/1191 1.8   Phosphorus (mg/dL)  Date Value  47/82/9562 3.9   Calcium (mg/dL)  Date Value  13/05/6577 7.8 (L)   Albumin (g/dL)  Date Value  46/96/2952 1.8 (L)    Plan: No replacement warranted at this time.   Will recheck electrolytes with am labs.   Pharmacy will continue to monitor and adjust per consult.   Simpson,Michael L,  02/06/2018 7:15 PM

## 2018-02-06 NOTE — Progress Notes (Signed)
PT Cancellation Note  Patient Details Name: Tommy Mathews MRN: [id number] DOB: [birth year]   Cancelled Treatment:    Reason Eval/Treat Not Completed: Patient not medically ready.  Per discussion with nursing, pt not appropriate for therapy at this time.  Pt still on Precedex drip.  Will re-attempt PT treatment at a later date/time.  Hendricks Limes, PT 02/06/18, 2:23 PM [phone]

## 2018-02-06 NOTE — Progress Notes (Signed)
OT Cancellation Note  Patient Details Name: Tommy Mathews MRN: [id number] DOB: 09/16/1961   Cancelled Treatment:    Reason Eval/Treat Not Completed: Medical issues which prohibited therapy. Spoke with PT. Per discussion with nursing, pt not appropriate for therapy at this time.  Pt still on Precedex drip.  Will re-attempt OT treatment at a later date/time.   Richrd Prime, MPH, MS, OTR/L ascom [phone] 02/06/18, 2:48 PM

## 2018-02-06 NOTE — Progress Notes (Signed)
Follow up - Critical Care Medicine Note  Patient Details:    Tommy Mathews is an 57 y.o. male.with a past medical history remarkable for diabetes, gastroesophageal reflux disease, hyperlipidemia, hypertension, traumatic brain injury, status post tracheostomy and PEG tube placement, is in the intensive care unit for sepsis, severe pneumonia, suspected aspiration  Lines, Airways, Drains: PICC Triple Lumen 01/31/18 PICC Right 46 cm 0 cm (Active)  Indication for Insertion or Continuance of Line Administration of hyperosmolar/irritating solutions (i.e. TPN, Vancomycin, etc.) 02/03/2018  8:00 AM  Exposed Catheter (cm) 0 cm 01/31/2018 12:00 PM  Site Assessment Clean;Dry;Intact 02/02/2018  8:50 PM  Lumen #1 Status Infusing 02/02/2018  8:50 PM  Lumen #2 Status Infusing 02/02/2018  8:50 PM  Lumen #3 Status Saline locked 02/02/2018  8:50 PM  Dressing Type Transparent 02/02/2018  8:50 PM  Dressing Status Clean;Dry;Intact 02/02/2018  8:50 PM  Line Care Connections checked and tightened 02/02/2018  8:50 PM  Line Adjustment (NICU/IV Team Only) No 02/01/2018 12:39 PM  Dressing Change Due 02/07/18 02/02/2018 11:06 AM     Gastrostomy/Enterostomy PEG-jejunostomy (Active)  Surrounding Skin Dry;Intact 02/03/2018  3:42 AM  Tube Status Patent 02/03/2018  3:42 AM  Drainage Appearance None 02/03/2018  3:42 AM  Dressing Status Clean;Dry;Intact 02/03/2018  3:42 AM  Dressing Intervention New dressing 01/31/2018  6:30 PM  Dressing Type Split gauze 02/03/2018  3:42 AM  Dressing Change Due 02/06/18 02/02/2018 11:30 PM  G Port Intake (mL) 80 ml 01/30/2018  5:30 PM     Urethral Catheter (Active)  Indication for Insertion or Continuance of Catheter Bladder outlet obstruction / other urologic reason 02/03/2018  3:42 AM  Site Assessment Intact;Clean 02/03/2018  3:42 AM  Catheter Maintenance Bag below level of bladder;No dependent loops;Seal intact;Bag emptied prior to transport 02/03/2018  3:42 AM  Collection Container Standard drainage bag 02/03/2018  3:42 AM   Securement Method Leg strap 02/03/2018  3:42 AM  Urinary Catheter Interventions Unclamped 02/03/2018  3:42 AM  Input (mL) 0 mL 01/15/2018 11:47 PM  Output (mL) 675 mL 02/03/2018  6:00 AM    Anti-infectives:  Anti-infectives (From admission, onward)   Start     Dose/Rate Route Frequency Ordered Stop   02/03/18 2100  vancomycin (VANCOCIN) 1,500 mg in sodium chloride 0.9 % 500 mL IVPB     1,500 mg 250 mL/hr over 120 Minutes Intravenous Every 12 hours 02/03/18 1950     02/03/18 0522  vancomycin (VANCOCIN) 1,500 mg in sodium chloride 0.9 % 500 mL IVPB  Status:  Discontinued     1,500 mg 250 mL/hr over 120 Minutes Intravenous As needed 02/03/18 0523 02/03/18 1950   02/01/18 2100  vancomycin (VANCOCIN) 1,500 mg in sodium chloride 0.9 % 500 mL IVPB  Status:  Discontinued     1,500 mg 250 mL/hr over 120 Minutes Intravenous Every 8 hours 02/01/18 2038 02/03/18 0523   02/01/18 1800  anidulafungin (ERAXIS) 100 mg in sodium chloride 0.9 % 100 mL IVPB  Status:  Discontinued     100 mg 78 mL/hr over 100 Minutes Intravenous Every 24 hours 01/31/18 1734 02/04/18 0911   02/01/18 1500  anidulafungin (ERAXIS) 100 mg in sodium chloride 0.9 % 100 mL IVPB  Status:  Discontinued     100 mg 78 mL/hr over 100 Minutes Intravenous Every 24 hours 01/31/18 1540 01/31/18 1734   01/31/18 2000  vancomycin (VANCOCIN) 1,250 mg in sodium chloride 0.9 % 250 mL IVPB  Status:  Discontinued     1,250 mg 166.7 mL/hr over  90 Minutes Intravenous Every 8 hours 01/31/18 1734 02/01/18 2038   01/31/18 1900  vancomycin (VANCOCIN) 1,250 mg in sodium chloride 0.9 % 250 mL IVPB  Status:  Discontinued     1,250 mg 166.7 mL/hr over 90 Minutes Intravenous Every 8 hours 01/31/18 1728 01/31/18 1734   01/31/18 1545  anidulafungin (ERAXIS) 200 mg in sodium chloride 0.9 % 200 mL IVPB     200 mg 78 mL/hr over 200 Minutes Intravenous  Once 01/31/18 1540 01/31/18 2107   01/31/18 0930  vancomycin (VANCOCIN) IVPB 1000 mg/200 mL premix     1,000  mg 200 mL/hr over 60 Minutes Intravenous STAT 01/31/18 0922 01/31/18 1545   01/31/18 0800  meropenem (MERREM) 1 g in sodium chloride 0.9 % 100 mL IVPB     1 g 200 mL/hr over 30 Minutes Intravenous Every 8 hours 01/31/18 0748 02/07/18 2359   01/21/18 0600  ceFAZolin (ANCEF) 3 g in dextrose 5 % 50 mL IVPB     3 g 130 mL/hr over 30 Minutes Intravenous  Once 01/20/18 1829 01/21/18 0654   01/20/18 0600  vancomycin (VANCOCIN) 1,250 mg in sodium chloride 0.9 % 250 mL IVPB  Status:  Discontinued     1,250 mg 166.7 mL/hr over 90 Minutes Intravenous Every 12 hours 01/20/18 0153 01/20/18 0905   01/19/18 0200  vancomycin (VANCOCIN) 1,250 mg in sodium chloride 0.9 % 250 mL IVPB  Status:  Discontinued     1,250 mg 166.7 mL/hr over 90 Minutes Intravenous Every 8 hours 01/18/18 1953 01/20/18 0153   01/18/18 2000  vancomycin (VANCOCIN) 1,250 mg in sodium chloride 0.9 % 250 mL IVPB     1,250 mg 166.7 mL/hr over 90 Minutes Intravenous  Once 01/18/18 1953 01/18/18 2246   01/18/18 2000  ceFEPIme (MAXIPIME) 2 g in sodium chloride 0.9 % 100 mL IVPB  Status:  Discontinued     2 g 200 mL/hr over 30 Minutes Intravenous Every 8 hours 01/18/18 1953 01/20/18 0905   01/12/18 0900  vancomycin (VANCOCIN) IVPB 1000 mg/200 mL premix  Status:  Discontinued     1,000 mg 200 mL/hr over 60 Minutes Intravenous Every 24 hours 01/11/18 1329 01/13/18 1548   01/10/18 1000  ceFEPIme (MAXIPIME) 2 g in sodium chloride 0.9 % 100 mL IVPB     2 g 200 mL/hr over 30 Minutes Intravenous Every 12 hours 01/10/18 0919 01/13/18 2129   01/09/18 2200  vancomycin (VANCOCIN) IVPB 750 mg/150 ml premix  Status:  Discontinued     750 mg 150 mL/hr over 60 Minutes Intravenous Every 12 hours 01/09/18 1952 01/11/18 1329   01/08/18 1503  Ampicillin-Sulbactam (UNASYN) 3 g in sodium chloride 0.9 % 100 mL IVPB  Status:  Discontinued     3 g 200 mL/hr over 30 Minutes Intravenous Every 6 hours 01/08/18 0931 01/08/18 1024   01/08/18 1230  ceFEPIme  (MAXIPIME) 2 g in sodium chloride 0.9 % 100 mL IVPB  Status:  Discontinued     2 g 200 mL/hr over 30 Minutes Intravenous Every 12 hours 01/08/18 1151 01/10/18 0919   01/08/18 1100  metroNIDAZOLE (FLAGYL) IVPB 500 mg  Status:  Discontinued     500 mg 100 mL/hr over 60 Minutes Intravenous Every 8 hours 01/08/18 1024 01/13/18 1547   01/08/18 0800  Ampicillin-Sulbactam (UNASYN) 3 g in sodium chloride 0.9 % 100 mL IVPB  Status:  Discontinued     3 g 200 mL/hr over 30 Minutes Intravenous Every 12 hours 01/07/18 1906 01/08/18 0931  01/08/18 0600  vancomycin (VANCOCIN) IVPB 1000 mg/200 mL premix  Status:  Discontinued     1,000 mg 200 mL/hr over 60 Minutes Intravenous Every 12 hours 01/08/18 0545 01/09/18 1952   01/07/18 2100  ceFEPIme (MAXIPIME) 2 g in sodium chloride 0.9 % 100 mL IVPB  Status:  Discontinued     2 g 200 mL/hr over 30 Minutes Intravenous Every 24 hours 01/06/18 2349 01/07/18 1008   01/07/18 1800  Ampicillin-Sulbactam (UNASYN) 3 g in sodium chloride 0.9 % 100 mL IVPB  Status:  Discontinued     3 g 200 mL/hr over 30 Minutes Intravenous Every 6 hours 01/07/18 1436 01/07/18 1906   01/07/18 1015  Ampicillin-Sulbactam (UNASYN) 3 g in sodium chloride 0.9 % 100 mL IVPB  Status:  Discontinued     3 g 200 mL/hr over 30 Minutes Intravenous Every 12 hours 01/07/18 1008 01/07/18 1436   01/07/18 0600  vancomycin (VANCOCIN) 1,250 mg in sodium chloride 0.9 % 250 mL IVPB  Status:  Discontinued     1,250 mg 166.7 mL/hr over 90 Minutes Intravenous Every 24 hours 01/06/18 2349 01/07/18 1917   01/06/18 2100  ceFEPIme (MAXIPIME) 2 g in sodium chloride 0.9 % 100 mL IVPB     2 g 200 mL/hr over 30 Minutes Intravenous  Once 01/06/18 2056 01/06/18 2320   01/06/18 2100  vancomycin (VANCOCIN) IVPB 1000 mg/200 mL premix     1,000 mg 200 mL/hr over 60 Minutes Intravenous  Once 01/06/18 2056 01/07/18 0024      Microbiology: Results for orders placed or performed during the hospital encounter of 01/06/18   Blood Culture (routine x 2)     Status: None   Collection Time: 01/06/18  7:44 PM  Result Value Ref Range Status   Specimen Description BLOOD BLOOD LEFT WRIST  Final   Special Requests   Final    BOTTLES DRAWN AEROBIC AND ANAEROBIC Blood Culture adequate volume   Culture   Final    NO GROWTH 5 DAYS Performed at [facility], [address], [place], [place] [address]    Report Status 01/11/2018 FINAL  Final  Urine culture     Status: Abnormal   Collection Time: 01/06/18  7:44 PM  Result Value Ref Range Status   Specimen Description   Final    URINE, RANDOM Performed at [facility], [address], [place], [place] [address]    Special Requests   Final    NONE Performed at [facility], [address] [place], [place] [address]    Culture (A)  Final    10,000 COLONIES/mL STAPHYLOCOCCUS SPECIES (COAGULASE NEGATIVE) CALL MICROBIOLOGY LAB IF SENSITIVITIES ARE REQUIRED. Performed at [facility], [address] [place], [place] [address]    Report Status 01/08/2018 FINAL  Final  MRSA PCR Screening     Status: Abnormal   Collection Time: 01/06/18 11:51 PM  Result Value Ref Range Status   MRSA by PCR POSITIVE (A) NEGATIVE Final    Comment:        The GeneXpert MRSA Assay (FDA approved for NASAL specimens only), is one component of a comprehensive MRSA colonization surveillance program. It is not intended to diagnose MRSA infection nor to guide or monitor treatment for MRSA infections. RESULT CALLED TO, READ BACK BY AND VERIFIED WITH: BETH BUONO 01/07/18 @ 0121  MLK   Performed at [facility], [address], [place], [place] [address]   Blood Culture (routine x 2)  Status: None   Collection Time: 01/07/18  7:19 AM  Result Value Ref Range Status   Specimen Description BLOOD RIGHT HAND  Final   Special Requests   Final    BOTTLES DRAWN AEROBIC AND ANAEROBIC Blood Culture results may not be optimal due to an inadequate  volume of blood received in culture bottles   Culture   Final    NO GROWTH 5 DAYS Performed at [facility], [address], [place], [place] [address]    Report Status 01/12/2018 FINAL  Final  Culture, respiratory (NON-Expectorated)     Status: None   Collection Time: 01/07/18 12:28 PM  Result Value Ref Range Status   Specimen Description   Final    TRACHEAL ASPIRATE Performed at [facility], [address] [place], [place] [address]    Special Requests   Final    Normal Performed at [facility], [address] [place], [place] [address]    Gram Stain   Final    MODERATE WBC PRESENT,BOTH PMN AND MONONUCLEAR RARE GRAM POSITIVE COCCI RARE YEAST    Culture   Final    Consistent with normal respiratory flora. Performed at [facility], [address], [place], [place] [address]    Report Status 01/09/2018 FINAL  Final  Culture, expectorated sputum-assessment     Status: None   Collection Time: 01/18/18  5:07 PM  Result Value Ref Range Status   Specimen Description ENDOTRACHEAL  Final   Special Requests NONE  Final   Sputum evaluation   Final    THIS SPECIMEN IS ACCEPTABLE FOR SPUTUM CULTURE Performed at [facility], [address], [place], [place] [address]    Report Status 01/18/2018 FINAL  Final  Culture, respiratory (NON-Expectorated)     Status: None   Collection Time: 01/18/18  5:07 PM  Result Value Ref Range Status   Specimen Description   Final    ENDOTRACHEAL Performed at [facility], [address] [place], [place] [address]    Special Requests   Final    NONE Reflexed from [id number] Performed at [facility], [address] [place], [place] [address]    Gram Stain   Final    MODERATE WBC PRESENT,BOTH PMN AND MONONUCLEAR NO SQUAMOUS EPITHELIAL CELLS SEEN RARE BUDDING YEAST SEEN    Culture   Final    RARE Consistent with normal respiratory flora. Performed at [facility],  [address], [place], [place] [address]    Report Status 01/21/2018 FINAL  Final  MRSA PCR Screening     Status: Abnormal   Collection Time: 01/18/18  8:26 PM  Result Value Ref Range Status   MRSA by PCR POSITIVE (A) NEGATIVE Final    Comment:        The GeneXpert MRSA Assay (FDA approved for NASAL specimens only), is one component of a comprehensive MRSA colonization surveillance program. It is not intended to diagnose MRSA infection nor to guide or monitor treatment for MRSA infections. RESULT CALLED TO, READ BACK BY AND VERIFIED WITH: ANGELA BREHUN AT 2200 ON 01/18/18 RWW Performed at [facility], [address] [place], [place] [address]   CULTURE, BLOOD (ROUTINE X 2) w Reflex to ID Panel     Status: None (Preliminary result)   Collection Time: 01/31/18  8:17 AM  Result Value Ref Range Status   Specimen Description   Final    BLOOD LEFT HAND Performed at [facility], [address] [place], [place] [address]    Special Requests   Final    BOTTLES DRAWN AEROBIC AND ANAEROBIC Blood Culture results may not be optimal due to an excessive volume of blood received in culture bottles Performed at [facility], [address] [place], [place] [address]    Culture  Setup Time   Final    GRAM NEGATIVE RODS AEROBIC BOTTLE ONLY CRITICAL RESULT CALLED TO, READ BACK BY AND VERIFIED WITH: DAVID BESANTI ON 02/05/18 AT 0228 JAG Performed at [facility], [address], [place], [place] [address]    Culture GRAM NEGATIVE RODS  Final   Report Status PENDING  Incomplete  Blood Culture ID Panel (Reflexed)     Status: Abnormal   Collection Time: 01/31/18  8:17 AM  Result Value Ref Range Status   Enterococcus species NOT DETECTED NOT DETECTED Final   Listeria monocytogenes NOT DETECTED NOT DETECTED Final   Staphylococcus species NOT DETECTED NOT DETECTED Final   Staphylococcus aureus NOT DETECTED NOT DETECTED Final   Streptococcus species NOT DETECTED  NOT DETECTED Final   Streptococcus agalactiae NOT DETECTED NOT DETECTED Final   Streptococcus pneumoniae NOT DETECTED NOT DETECTED Final   Streptococcus pyogenes NOT DETECTED NOT DETECTED Final   Acinetobacter baumannii NOT DETECTED NOT DETECTED Final   Enterobacteriaceae species DETECTED (A) NOT DETECTED Final    Comment: Enterobacteriaceae represent a large family of gram-negative bacteria, not a single organism. CRITICAL RESULT CALLED TO, READ BACK BY AND VERIFIED WITH: DAVID BESANTI ON 02/05/18 AT 0228 JAG    Enterobacter cloacae complex NOT DETECTED NOT DETECTED Final   Escherichia coli NOT DETECTED NOT DETECTED Final   Klebsiella oxytoca NOT DETECTED NOT DETECTED Final   Klebsiella pneumoniae DETECTED (A) NOT DETECTED Final    Comment: CRITICAL RESULT CALLED TO, READ BACK BY AND VERIFIED WITH: DAVID BESANTI ON 02/05/18 AT 0228 JAG    Proteus species NOT DETECTED NOT DETECTED Final   Serratia marcescens NOT DETECTED NOT DETECTED Final   Carbapenem resistance NOT DETECTED NOT DETECTED Final   Haemophilus influenzae NOT DETECTED NOT DETECTED Final   Neisseria meningitidis NOT DETECTED NOT DETECTED Final   Pseudomonas aeruginosa NOT DETECTED NOT DETECTED Final   Candida albicans NOT DETECTED NOT DETECTED Final   Candida glabrata NOT DETECTED NOT DETECTED Final   Candida krusei NOT DETECTED NOT DETECTED Final   Candida parapsilosis NOT DETECTED NOT DETECTED Final   Candida tropicalis NOT DETECTED NOT DETECTED Final    Comment: Performed at [facility], [address], [place], [place] [address]  Urine Culture     Status: None   Collection Time: 01/31/18  8:30 AM  Result Value Ref Range Status   Specimen Description   Final    URINE, RANDOM Performed at [facility], [address], [place], [place] [address]    Special Requests   Final    NONE Performed at [facility], [address] [place], [place] [address]    Culture   Final    NO  GROWTH Performed at [facility], [address], [place], [place] [address]    Report Status 02/01/2018 FINAL  Final  CULTURE, BLOOD (ROUTINE X 2) w Reflex to ID Panel     Status: None (Preliminary result)   Collection Time: 01/31/18  8:53 AM  Result Value Ref Range Status   Specimen Description BLOOD BLOOD RIGHT HAND  Final   Special Requests   Final  BOTTLES DRAWN AEROBIC AND ANAEROBIC Blood Culture adequate volume   Culture  Setup Time   Final    GRAM NEGATIVE RODS AEROBIC BOTTLE ONLY CRITICAL RESULT CALLED TO, READ BACK BY AND VERIFIED WITH: DAVID BESANTI AT 0340 ON 02/05/18 [facility]. Performed at [facility], [address] [place], [place] [address]    Culture GRAM NEGATIVE RODS  Final   Report Status PENDING  Incomplete  Culture, respiratory (NON-Expectorated)     Status: None   Collection Time: 01/31/18 10:47 AM  Result Value Ref Range Status   Specimen Description   Final    TRACHEAL ASPIRATE Performed at [facility], [address] [place], [place] [address]    Special Requests   Final    NONE Performed at [facility], [address] [place], [place] [address]    Gram Stain   Final    MODERATE WBC PRESENT, PREDOMINANTLY PMN MODERATE GRAM POSITIVE COCCI MODERATE GRAM POSITIVE RODS    Culture   Final    Consistent with normal respiratory flora. Performed at [facility], [address] [place], [place] [address]    Report Status 02/02/2018 FINAL  Final  CULTURE, BLOOD (ROUTINE X 2) w Reflex to ID Panel     Status: None (Preliminary result)   Collection Time: 02/04/18  5:41 PM  Result Value Ref Range Status   Specimen Description BLOOD BLOOD LEFT HAND  Final   Special Requests   Final    BOTTLES DRAWN AEROBIC AND ANAEROBIC Blood Culture adequate volume   Culture   Final    NO GROWTH < 12 HOURS Performed at [facility], [address] [place], [place] [address]    Report Status PENDING  Incomplete   CULTURE, BLOOD (ROUTINE X 2) w Reflex to ID Panel     Status: None (Preliminary result)   Collection Time: 02/04/18  8:50 PM  Result Value Ref Range Status   Specimen Description BLOOD LEFT HAND  Final   Special Requests   Final    BOTTLES DRAWN AEROBIC AND ANAEROBIC Blood Culture adequate volume   Culture   Final    NO GROWTH < 12 HOURS Performed at [facility], [address], [place], [place] [address]    Report Status PENDING  Incomplete  MRSA PCR Screening     Status: None   Collection Time: 02/05/18  9:38 AM  Result Value Ref Range Status   MRSA by PCR NEGATIVE NEGATIVE Final    Comment:        The GeneXpert MRSA Assay (FDA approved for NASAL specimens only), is one component of a comprehensive MRSA colonization surveillance program. It is not intended to diagnose MRSA infection nor to guide or monitor treatment for MRSA infections. Performed at [facility], [address] [place], [place] [address]     Best Practice/Protocols:    Events:  Studies: Dg Abd 1 View  Result Date: 01/27/2018 CLINICAL DATA:  Ileus EXAM: ABDOMEN - 1 VIEW COMPARISON:  01/24/2018 FINDINGS: Diffuse gaseous distention of bowel again noted compatible with ileus, unchanged. No free air or organomegaly. No acute bony abnormality. IMPRESSION: Stable ileus pattern. Electronically Signed   By: Charlett Nose M.D.   On: 01/27/2018 07:51   Dg Abd 1 View  Result Date: 01/24/2018 CLINICAL DATA:  Constipation EXAM: ABDOMEN - 1 VIEW COMPARISON:  01/13/2018 FINDINGS: A gastrostomy tube is demonstrated over the stomach. There is mild gaseous distention of the stomach, colon, and  small bowel. Prominent stool in the rectum. Changes may be due to adynamic ileus or pseudo-obstruction from impacted rectal stool. No radiopaque stones. Infiltration or atelectasis in the lung bases. IMPRESSION: Mildly gas distended stomach, small bowel, and colon with prominent stool in the rectum suggesting  adynamic ileus or pseudo-obstruction due to stool impaction. Electronically Signed   By: Burman Nieves M.D.   On: 01/24/2018 23:47   Dg Abd 1 View  Result Date: 01/13/2018 CLINICAL DATA:  Orogastric tube placement EXAM: ABDOMEN - 1 VIEW COMPARISON:  CT 01/06/2018 FINDINGS: Consolidation at the left lung base. Esophageal tube tip projects over the proximal stomach. Air-filled dilated bowel in the upper abdomen. IMPRESSION: Esophageal tube tip overlies the proximal stomach. There is consolidation at the left lung base. Electronically Signed   By: Jasmine Pang M.D.   On: 01/13/2018 21:32   Dg Chest Port 1 View  Result Date: 02/05/2018 CLINICAL DATA:  57 year old male with acute respiratory failure. EXAM: PORTABLE CHEST 1 VIEW COMPARISON:  Chest radiograph dated 02/04/2018 FINDINGS: Right-sided PICC in similar position. Tracheostomy above the carina. Diffuse airspace opacity throughout the lungs with no significant interval change compared to the prior radiograph. No pneumothorax. The cardiac borders are silhouetted. No acute osseous pathology. IMPRESSION: No significant interval change. Electronically Signed   By: Elgie Collard M.D.   On: 02/05/2018 06:22   Dg Chest Port 1 View  Result Date: 02/04/2018 CLINICAL DATA:  Acute respiratory failure, pulmonary edema, sepsis, hypertension, diabetes, current smoker. EXAM: PORTABLE CHEST 1 VIEW COMPARISON:  Portable chest x-ray of Feb 03, 2018 FINDINGS: Diffuse airspace opacities have worsened bilaterally. The cardiac silhouette is mildly enlarged. The pulmonary vascularity is indistinct. There is calcification in the wall of the aortic arch. There may be pleural fluid layering posteriorly on the left. The tracheostomy tube tip projects between the clavicular heads. The right sided PICC line tip projects over the midportion of the SVC. IMPRESSION: Worsening airspace opacities bilaterally consistent with progressive pneumonia or pulmonary edema. Thoracic aortic  atherosclerosis. Electronically Signed   By: David  [place] M.D.   On: 02/04/2018 10:28   Dg Chest Port 1 View  Result Date: 02/03/2018 CLINICAL DATA:  Follow-up pneumonia EXAM: PORTABLE CHEST 1 VIEW COMPARISON:  02/02/2018 FINDINGS: Cardiac shadow is stable. Right-sided PICC line and tracheostomy tube are noted and stable. Diffuse bilateral infiltrates are again seen and stable. No bony abnormality is noted. IMPRESSION: Stable appearance when compared with the previous exam. Electronically Signed   By: Alcide Clever M.D.   On: 02/03/2018 07:15   Dg Chest Port 1 View  Result Date: 02/02/2018 CLINICAL DATA:  Pneumonia EXAM: PORTABLE CHEST 1 VIEW COMPARISON:  Chest radiograph from one day prior. FINDINGS: Tracheostomy tube tip overlies the tracheal air column at the thoracic inlet. Right PICC terminates at the cavoatrial junction. Stable cardiomediastinal silhouette with normal heart size. No pneumothorax. No pleural effusion. Severe patchy opacity throughout both lungs is not appreciably changed. IMPRESSION: 1. Stable support structures as detailed. 2. Stable severe diffuse patchy lung opacities suggesting multilobar pneumonia and/or ARDS. Electronically Signed   By: Delbert Phenix M.D.   On: 02/02/2018 07:14   Dg Chest Port 1 View  Result Date: 02/01/2018 CLINICAL DATA:  Pneumonia EXAM: PORTABLE CHEST 1 VIEW COMPARISON:  Chest radiograph from one day prior. FINDINGS: Tracheostomy tube tip overlies the tracheal air column at the thoracic inlet. Right PICC terminates at the cavoatrial junction. Stable cardiomediastinal silhouette with normal heart size. No pneumothorax. No pleural effusion. Hazy  airspace opacities throughout both lungs are slightly improved. IMPRESSION: Hazy airspace opacities throughout both lungs, slightly improved. Electronically Signed   By: Delbert Phenix M.D.   On: 02/01/2018 09:03   Dg Chest Port 1 View  Result Date: 01/31/2018 CLINICAL DATA:  PICC line placement. EXAM: PORTABLE CHEST 1  VIEW COMPARISON:  01/31/2018 and 01/30/2018. FINDINGS: 1225 hours. Right arm PICC extends to the level of the superior cavoatrial junction. Tracheostomy appears unchanged. There are stable diffuse bilateral pulmonary opacities. No pneumothorax or significant pleural effusion. The heart size and mediastinal contours are grossly stable, partially obscured by the airspace opacities. IMPRESSION: PICC line terminates at the level of the superior cavoatrial junction. Persistent bilateral airspace opacities. Electronically Signed   By: Carey Bullocks M.D.   On: 01/31/2018 12:52   Dg Chest Port 1 View  Result Date: 01/31/2018 CLINICAL DATA:  Congestive heart failure. EXAM: PORTABLE CHEST 1 VIEW COMPARISON:  Radiograph of Jan 30, 2018. FINDINGS: Stable cardiomediastinal silhouette. Tracheostomy tube and right sided PICC line are unchanged in position. Hypoinflation of the lungs is noted. Stable bilateral lung opacities are noted concerning for edema or pneumonia. No pneumothorax or significant pleural effusion is noted. Bony thorax is unremarkable. IMPRESSION: Stable support apparatus. Hypoinflation of the lungs. Stable bilateral lung opacities concerning for edema or pneumonia. Electronically Signed   By: Lupita Raider, M.D.   On: 01/31/2018 07:13   Dg Chest Port 1 View  Result Date: 01/30/2018 CLINICAL DATA:  Congestive heart failure EXAM: PORTABLE CHEST 1 VIEW COMPARISON:  Jan 29, 2018 FINDINGS: Tracheostomy catheter tip is 5.6 cm above the carina. Central catheter tip is in the superior vena cava. No pneumothorax. There remains cardiomegaly with pulmonary venous hypertension. There is patchy interstitial and alveolar opacity, likely edema. No adenopathy. No bone lesions. IMPRESSION: Tube and catheter positions as described without pneumothorax. Pulmonary vascular congestion with interstitial and alveolar edema, stable from 1 day prior. No new opacity evident. Electronically Signed   By: [name].    On: 01/30/2018 09:07   Dg Chest Port 1 View  Result Date: 01/29/2018 CLINICAL DATA:  Shortness of breath EXAM: PORTABLE CHEST 1 VIEW COMPARISON:  January 28, 2018 FINDINGS: Tracheostomy catheter tip is 5.0 cm above the carina. Central catheter tip is in the superior vena cava. No pneumothorax. There is diffuse airspace opacity throughout the lungs bilaterally, similar to 1 day prior. There are small pleural effusions bilaterally. No new opacity evident. Heart is enlarged with pulmonary venous hypertension. No adenopathy appreciable. No bone lesions. IMPRESSION: Tube and catheter positions as described without pneumothorax. Cardiomegaly with pulmonary venous hypertension, consistent with pulmonary vascular congestion. Small pleural effusions bilaterally. Widespread airspace opacity bilaterally may represent alveolar edema or pneumonia. Both entities may exist concurrently. A degree of underlying ARDS is also possible. The overall appearance is essentially stable compared to 1 day prior. Electronically Signed   By: [name].   On: 01/29/2018 07:45   Dg Chest Port 1 View  Result Date: 01/28/2018 CLINICAL DATA:  Pneumonia EXAM: PORTABLE CHEST 1 VIEW COMPARISON:  01/23/2018 FINDINGS: Tracheostomy and right PICC line remain in place, unchanged. Cardiomegaly. Vascular congestion and diffuse bilateral airspace opacities are again noted, unchanged. Low lung volumes. No visible effusions or acute bony abnormality. IMPRESSION: Cardiomegaly with vascular congestion and diffuse bilateral airspace disease which could reflect edema or infection. No change. Electronically Signed   By: Charlett Nose M.D.   On: 01/28/2018 09:21   Dg Chest [facility]  1 View  Result Date: 01/23/2018 CLINICAL DATA:  Tracheostomy placement EXAM: PORTABLE CHEST 1 VIEW COMPARISON:  January 21, 2018 FINDINGS: Tracheostomy catheter tip is 4.1 cm above the carina. Central catheter tip is at the cavoatrial junction. No pneumothorax. There is  persistent moderate interstitial and patchy alveolar edema with small pleural effusions bilaterally. Heart is mildly enlarged with pulmonary venous hypertension. No adenopathy. No bone lesions. IMPRESSION: Tube and catheter positions as described without pneumothorax. Evidence of a degree of congestive heart failure, similar to most recent prior study. Electronically Signed   By: [name].   On: 01/23/2018 12:06   Dg Chest Port 1 View  Result Date: 01/21/2018 CLINICAL DATA:  Hypoxia EXAM: PORTABLE CHEST 1 VIEW COMPARISON:  January 19, 2018 FINDINGS: Endotracheal tube tip is 3.7 cm above the carina. Nasogastric tube tip and side port are below the diaphragm. Central catheter tip is in the superior vena cava. No pneumothorax. There is moderate interstitial and patchy alveolar opacities, slightly increased. There is a small left pleural effusion. Heart is upper normal in size with pulmonary venous hypertension. No adenopathy. No bone lesions. IMPRESSION: Tube and catheter positions as described without pneumothorax. Increase interstitial and patchy alveolar pulmonary edema, likely indicative of a degree of congestive heart failure. Small left pleural effusion. Underlying pulmonary vascular congestion. Electronically Signed   By: [name].   On: 01/21/2018 07:04   Dg Chest Port 1 View  Result Date: 01/19/2018 CLINICAL DATA:  Acute respiratory failure. EXAM: PORTABLE CHEST 1 VIEW COMPARISON:  01/18/2018 FINDINGS: Endotracheal tube terminates 4 cm above the carina. Enteric tube can be followed to the GE junction but is not well visualized beyond this due to under penetration in the upper abdomen. Right PICC terminates at the cavoatrial junction. The cardiomediastinal silhouette is unchanged allowing for differences in patient rotation. Interstitial and patchy airspace opacities are again seen bilaterally with mild improvement in the right base and without significant interval change  on the left. No large pleural effusion or pneumothorax is identified. IMPRESSION: Bilateral interstitial and airspace opacities, slightly improved on the right and likely reflecting pneumonia. Electronically Signed   By: Sebastian Ache M.D.   On: 01/19/2018 07:49   Dg Chest Port 1 View  Result Date: 01/18/2018 CLINICAL DATA:  Oxygen desaturation EXAM: PORTABLE CHEST 1 VIEW COMPARISON:  01/18/2018 FINDINGS: Endotracheal tube, right PICC line and NG tube remain in place, unchanged. Bilateral interstitial and alveolar opacities are again noted, unchanged. Small bilateral effusions suspected. Heart is upper limits normal in size. IMPRESSION: Bilateral interstitial and airspace opacities with small effusions. No real change. Electronically Signed   By: Charlett Nose M.D.   On: 01/18/2018 16:39   Dg Chest Port 1 View  Result Date: 01/18/2018 CLINICAL DATA:  Acute respiratory failure. EXAM: PORTABLE CHEST 1 VIEW COMPARISON:  01/16/2018 and CT chest 01/06/2018. FINDINGS: Endotracheal tube terminates 4.9 cm above the carina. Nasogastric tube is followed into the stomach. Right PICC tip projects over the SVC RA junction. Heart size within normal limits. Lungs are somewhat low in volume with diffuse mixed interstitial and airspace opacification, possibly mildly worsened from 01/16/2018. There may be right middle lobe and left lower lobe consolidation. No definite pleural fluid. IMPRESSION: Mixed interstitial and airspace opacification, possibly slightly worsened from 01/16/2018, with possible consolidation in the right middle and left lower lobes. Findings are indicative of persistent pneumonia, especially when compared with 01/06/2018. Electronically Signed   By: Leanna Battles M.D.  On: 01/18/2018 08:12   Dg Chest Port 1 View  Result Date: 01/16/2018 CLINICAL DATA:  Hypoxia and acute respiratory failure. EXAM: PORTABLE CHEST 1 VIEW COMPARISON:  None. FINDINGS: Cardiomediastinal silhouette is unchanged. An  endotracheal tube with tip 3.2 cm above the carina, NG tube entering the stomach, RIGHT PICC line with tip overlying the SUPERIOR cavoatrial junction are again noted. Interstitial/airspace opacities are again noted with slight improved aeration on the LEFT. Slightly increased RIGHT UPPER lobe atelectasis noted. There is no evidence of pneumothorax. IMPRESSION: Slightly increased RIGHT UPPER lobe atelectasis and slightly improved LEFT lung aeration. Otherwise no significant change. Electronically Signed   By: Harmon Pier M.D.   On: 01/16/2018 20:49   Dg Chest Port 1 View  Result Date: 01/13/2018 CLINICAL DATA:  Respiratory failure. EXAM: PORTABLE CHEST 1 VIEW COMPARISON:  One-view chest x-ray 01/12/2018. FINDINGS: Heart size is exaggerate by low lung volumes. Endotracheal tube is stable. Left greater than right interstitial and airspace disease is similar the prior exam. Overall lung volumes are slightly improved. IMPRESSION: 1. Stable appearance of left greater than right interstitial and airspace disease, likely a combination of edema and possible infection. 2. Support apparatus is stable. 3. Slight improvement in lung volumes. Electronically Signed   By: Marin Roberts M.D.   On: 01/13/2018 07:49   Dg Chest Port 1 View  Result Date: 01/12/2018 CLINICAL DATA:  Acute respiratory failure with hypoxia. Severe sepsis with septic shock. On ventilator. EXAM: PORTABLE CHEST 1 VIEW COMPARISON:  01/11/2018 FINDINGS: Endotracheal tube and nasogastric tube remain in appropriate position. Heart size remains within normal limits. Low lung volumes again seen with diffuse interstitial infiltrates. No evidence of focal consolidation or significant pleural effusion. IMPRESSION: Stable low lung volumes with diffuse interstitial infiltrates/edema. Electronically Signed   By: Myles Rosenthal M.D.   On: 01/12/2018 07:37   Dg Chest Port 1 View  Result Date: 01/11/2018 CLINICAL DATA:  Respiratory failure EXAM: PORTABLE  CHEST 1 VIEW COMPARISON:  January 10, 2018 FINDINGS: The ETT terminates 2.8 cm above the carina. No pneumothorax. The cardiomediastinal silhouette is stable. Pulmonary edema has decreased but persists. No other changes. IMPRESSION: 1. Stable support apparatus. 2. Persistent but decreased pulmonary edema. 3. No other change. Electronically Signed   By: Gerome Sam III M.D   On: 01/11/2018 07:01   Dg Chest Port 1 View  Result Date: 01/10/2018 CLINICAL DATA:  Respiratory failure EXAM: PORTABLE CHEST 1 VIEW COMPARISON:  01/08/2018 FINDINGS: Cardiac shadow is stable. Endotracheal tube and nasogastric catheter are again noted in satisfactory position. Bilateral infiltrates are again identified and stable. No new focal abnormality is seen. No bony abnormality is noted. IMPRESSION: Stable bilateral infiltrates. Electronically Signed   By: Alcide Clever M.D.   On: 01/10/2018 07:24   Dg Chest Port 1 View  Result Date: 01/08/2018 CLINICAL DATA:  Respiratory failure EXAM: PORTABLE CHEST 1 VIEW COMPARISON:  Portable exam 1003 hours compared to 01/07/2018 FINDINGS: Tip of endotracheal tube projects 4.4 cm above carina. Nasogastric tube extends into abdomen. Enlargement of cardiac silhouette. Diffuse BILATERAL airspace infiltrates increased in upper lobes since previous exam. No gross pleural effusion or pneumothorax. IMPRESSION: Increased BILATERAL airspace infiltrates. Electronically Signed   By: Ulyses Southward M.D.   On: 01/08/2018 10:33   [place] Ekg Site Rite  Result Date: 01/13/2018 If Site Rite image not attached, placement could not be confirmed due to current cardiac rhythm.   Consults: Treatment Team:  Marcina Millard, MD Mick Sell, MD  Subjective:    Overnight patient Did not have any events. Still requiring 100% FiO2 and 10 of PEEP.patient with 1.139 L diuresis yesterday in the morning chest x-ray  Objective:  Vital signs for last 24 hours: Temp:  [96.6 F (35.9 C)-98.6 F (37 C)]  98.6 F (37 C) (05/09 0400) Pulse Rate:  [51-69] 58 (05/09 0600) Resp:  [21-35] 24 (05/09 0600) BP: (90-133)/(49-73) 126/69 (05/09 0600) SpO2:  [89 %-99 %] 95 % (05/09 0600) FiO2 (%):  [80 %-100 %] 100 % (05/09 0336)  Hemodynamic parameters for last 24 hours:    Intake/Output from previous day: 05/08 0701 - 05/09 0700 In: 3535.2 [I.V.:675.2; NG/GT:1560; IV Piggyback:1300] Out: 4675 [Urine:4675]  Intake/Output this shift: No intake/output data recorded.  Vent settings for last 24 hours: Vent Mode: PRVC FiO2 (%):  [80 %-100 %] 100 % Set Rate:  [14 bmp] 14 bmp Vt Set:  [500 mL] 500 mL PEEP:  [10 cmH20] 10 cmH20 Plateau Pressure:  [32 cmH20] 32 cmH20  Physical Exam:  Patient is on mechanical ventilation through tracheostomy. Appears to track and minimally respond Vital signs: Please see the above listed vital signs HEENT: Tracheostomy in place with mechanical ventilation, trachea is midline, mild increased work of breathing, no noted jugular venous distention Cardiovascular: Regular rate and rhythm,  Pulmonary: Coarse expiratory rhonchi noted, inspiratory crackles appreciated bilateral Abdominal: PEG tube noted to be in place, mild distention, bowel sounds appreciated Extremity: Edema noted Neurologic: Patient moves extremities, appears to track  Assessment/Plan:   Patient 57 year old gentleman with traumatic brain injury with acute respiratory failure, severe bilateral pneumonia aspiration, sepsis. Still requiring high PEEP and FiO2. Pending morning CXR. Continue to diurese if blood pressure is not prohibitive. Presently being followed by infectious disease, on meropenem, vancomycin. Positive blood cultures for Enterobacter and Klebsiella.  History of atrial fibrillation. On amiodarone And systemic anticoagulation  History of dilated cardiomyopathy with ejection fraction 20%  Anemia. No clear evidence of active bleeding. Hemoglobin is 7.2  Leukocytosis. White count is  15.5    Critical Care Total Time: 40 minutes  Tommy Mathews 02/06/2018  *Care during the described time interval was provided by me and/or other providers on the critical care team.  I have reviewed this patient's available data, including medical history, events of note, physical examination and test results as part of my evaluation. Patient ID: Tommy Mathews, male   DOB: 1961-02-14, 57 y.o.   MRN: [id number] Patient ID: Tommy Mathews, male   DOB: July 06, 1961, 57 y.o.   MRN: [id number] Patient ID: Tommy Mathews, male   DOB: Jun 26, 1961, 57 y.o.   MRN: [id number]

## 2018-02-06 NOTE — Progress Notes (Signed)
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  Thank you.

## 2018-02-07 LAB — CBC WITH DIFFERENTIAL/PLATELET
BASOS PCT: 0 %
Band Neutrophils: 3 %
Basophils Absolute: 0 10*3/uL (ref 0–0.1)
Blasts: 0 %
EOS ABS: 0.1 10*3/uL (ref 0–0.7)
Eosinophils Relative: 1 %
HCT: 22.5 % — ABNORMAL LOW (ref 40.0–52.0)
HEMOGLOBIN: 7.2 g/dL — AB (ref 13.0–18.0)
Lymphocytes Relative: 11 %
Lymphs Abs: 1.6 10*3/uL (ref 1.0–3.6)
MCH: 27.1 pg (ref 26.0–34.0)
MCHC: 31.9 g/dL — ABNORMAL LOW (ref 32.0–36.0)
MCV: 85 fL (ref 80.0–100.0)
MONO ABS: 1.3 10*3/uL — AB (ref 0.2–1.0)
MYELOCYTES: 0 %
Metamyelocytes Relative: 1 %
Monocytes Relative: 9 %
NEUTROS PCT: 75 %
NRBC: 0 /100{WBCs}
Neutro Abs: 11.8 10*3/uL — ABNORMAL HIGH (ref 1.4–6.5)
Other: 0 %
PROMYELOCYTES RELATIVE: 0 %
Platelets: 480 10*3/uL — ABNORMAL HIGH (ref 150–440)
RBC: 2.65 MIL/uL — ABNORMAL LOW (ref 4.40–5.90)
RDW: 18.5 % — ABNORMAL HIGH (ref 11.5–14.5)
WBC: 14.8 10*3/uL — ABNORMAL HIGH (ref 3.8–10.6)

## 2018-02-07 LAB — CULTURE, BLOOD (ROUTINE X 2): SPECIAL REQUESTS: ADEQUATE

## 2018-02-07 LAB — ALBUMIN: Albumin: 1.6 g/dL — ABNORMAL LOW (ref 3.5–5.0)

## 2018-02-07 LAB — GLUCOSE, CAPILLARY
GLUCOSE-CAPILLARY: 128 mg/dL — AB (ref 65–99)
GLUCOSE-CAPILLARY: 133 mg/dL — AB (ref 65–99)
GLUCOSE-CAPILLARY: 150 mg/dL — AB (ref 65–99)
GLUCOSE-CAPILLARY: 159 mg/dL — AB (ref 65–99)
Glucose-Capillary: 130 mg/dL — ABNORMAL HIGH (ref 65–99)
Glucose-Capillary: 175 mg/dL — ABNORMAL HIGH (ref 65–99)

## 2018-02-07 LAB — BASIC METABOLIC PANEL
ANION GAP: 5 (ref 5–15)
BUN: 20 mg/dL (ref 6–20)
CALCIUM: 8 mg/dL — AB (ref 8.9–10.3)
CO2: 37 mmol/L — AB (ref 22–32)
Chloride: 96 mmol/L — ABNORMAL LOW (ref 101–111)
Creatinine, Ser: 0.61 mg/dL (ref 0.61–1.24)
GFR calc Af Amer: >60 mL/min (ref >60)
GLUCOSE: 154 mg/dL — AB (ref 65–99)
Potassium: 4.2 mmol/L (ref 3.5–5.1)
Sodium: 138 mmol/L (ref 135–145)

## 2018-02-07 MED ORDER — IPRATROPIUM-ALBUTEROL 0.5-2.5 (3) MG/3ML IN SOLN
3.0000 mL | Freq: Four times a day (QID) | RESPIRATORY_TRACT | Status: DC
  Administered 2018-02-07 – 2018-02-17 (×41): 3 mL via RESPIRATORY_TRACT
  Filled 2018-02-07 (×41): qty 3

## 2018-02-07 MED ORDER — METOLAZONE 5 MG PO TABS
10.0000 mg | ORAL_TABLET | Freq: Every day | ORAL | Status: DC
  Administered 2018-02-07 – 2018-02-10 (×4): 10 mg via ORAL
  Filled 2018-02-07 (×5): qty 2

## 2018-02-07 MED ORDER — ENOXAPARIN SODIUM 40 MG/0.4ML ~~LOC~~ SOLN
40.0000 mg | SUBCUTANEOUS | Status: DC
  Administered 2018-02-07 – 2018-02-11 (×5): 40 mg via SUBCUTANEOUS
  Filled 2018-02-07 (×5): qty 0.4

## 2018-02-07 NOTE — Progress Notes (Signed)
[name] INFECTIOUS DISEASE PROGRESS NOTE Date of Admission:  01/06/2018     ID: Tommy Mathews is a 57 y.o. male with fever Principal Problem:   Severe sepsis with septic shock ([place]) Active Problems:   GI bleed   Acute respiratory failure with hypoxia (HCC)   Multifocal pneumonia   UTI (urinary tract infection)   AKI (acute kidney injury) ([place])   Ileus (HCC)   Diabetes ([place])   Pressure injury of skin   Subjective: No fever, wbc down to 12  ROS  Unable to obtain Medications:  Antibiotics Given (last 72 hours)    Date/Time Action Medication Dose Rate   02/04/18 2209 New Bag/Given   vancomycin (VANCOCIN) 1,500 mg in sodium chloride 0.9 % 500 mL IVPB 1,500 mg 250 mL/hr   02/04/18 2300 New Bag/Given   meropenem (MERREM) 1 g in sodium chloride 0.9 % 100 mL IVPB 1 g 200 mL/hr   02/05/18 0520 New Bag/Given   meropenem (MERREM) 1 g in sodium chloride 0.9 % 100 mL IVPB 1 g 200 mL/hr   02/05/18 1122 New Bag/Given   vancomycin (VANCOCIN) 1,500 mg in sodium chloride 0.9 % 500 mL IVPB 1,500 mg 250 mL/hr   02/05/18 1408 New Bag/Given   meropenem (MERREM) 1 g in sodium chloride 0.9 % 100 mL IVPB 1 g 200 mL/hr   02/05/18 2251 New Bag/Given   meropenem (MERREM) 1 g in sodium chloride 0.9 % 100 mL IVPB 1 g 200 mL/hr   02/05/18 2251 New Bag/Given   vancomycin (VANCOCIN) 1,500 mg in sodium chloride 0.9 % 500 mL IVPB 1,500 mg 250 mL/hr   02/06/18 0511 New Bag/Given   meropenem (MERREM) 1 g in sodium chloride 0.9 % 100 mL IVPB 1 g 200 mL/hr   02/06/18 0919 New Bag/Given   vancomycin (VANCOCIN) 1,500 mg in sodium chloride 0.9 % 500 mL IVPB 1,500 mg 250 mL/hr   02/06/18 1321 New Bag/Given   meropenem (MERREM) 1 g in sodium chloride 0.9 % 100 mL IVPB 1 g 200 mL/hr   02/06/18 2013 New Bag/Given   vancomycin (VANCOCIN) 1,500 mg in sodium chloride 0.9 % 500 mL IVPB 1,500 mg 250 mL/hr   02/06/18 2241 New Bag/Given   meropenem (MERREM) 1 g in sodium chloride 0.9 % 100 mL IVPB 1 g 200 mL/hr    02/07/18 0503 New Bag/Given   meropenem (MERREM) 1 g in sodium chloride 0.9 % 100 mL IVPB 1 g 200 mL/hr   02/07/18 0943 New Bag/Given   vancomycin (VANCOCIN) 1,500 mg in sodium chloride 0.9 % 500 mL IVPB 1,500 mg 250 mL/hr   02/07/18 1502 New Bag/Given  [given when arrived from pharmacy]   meropenem (MERREM) 1 g in sodium chloride 0.9 % 100 mL IVPB 1 g 200 mL/hr     . amiodarone  400 mg Per Tube BID  . atorvastatin  80 mg Per Tube q1800  . budesonide (PULMICORT) nebulizer solution  0.5 mg Nebulization BID  . chlorhexidine gluconate (MEDLINE KIT)  15 mL Mouth Rinse BID  . clonazePAM  1 mg Per Tube BID  . digoxin  0.25 mg Per Tube Daily  . enoxaparin (LOVENOX) injection  40 mg Subcutaneous Q24H  . fentaNYL  100 mcg Transdermal Q72H  . FLUoxetine  40 mg Per Tube Daily  . free water  200 mL Per Tube Q8H  . furosemide  40 mg Intravenous Daily  . gabapentin  600 mg Per Tube Q8H  . insulin aspart  0-20 Units  Subcutaneous Q4H  . insulin glargine  20 Units Subcutaneous QHS  . ipratropium-albuterol  3 mL Nebulization Q6H  . lisinopril  5 mg Per Tube Daily  . mouth rinse  15 mL Mouth Rinse 10 times per day  . metoCLOPramide  10 mg Oral Q12H  . metolazone  10 mg Oral QPC breakfast  . metoprolol tartrate  25 mg Per Tube Q6H  . nutrition supplement (JUVEN)  1 packet Per Tube BID  . nystatin  5 mL Oral QID  . pantoprazole sodium  40 mg Per Tube Daily  . Valproate Sodium  1,000 mg Per Tube BID    Objective: Vital signs in last 24 hours: Temp:  [98.2 F (36.8 C)-99.4 F (37.4 C)] 99.2 F (37.3 C) (05/10 1500) Pulse Rate:  [50-73] 59 (05/10 1830) Resp:  [20-34] 25 (05/10 1830) BP: (104-136)/(46-104) 116/65 (05/10 1830) SpO2:  [85 %-100 %] 97 % (05/10 1830) FiO2 (%):  [85 %-100 %] 100 % (05/10 1552) Constitutional: lethargic, HENT: trach in place, anicteric Mouth/Throat: Oropharynx is clear and moist.  Cardiovascular: Normal rate, regular rhythm and normal heart sounds.   Pulmonary/Chest: rhonchi bil Abdominal: Soft. Bowel sounds are normal. He exhibits no distension. There is no tenderness.  PEG Site wnl Lymphadenopathy: He has no cervical adenopathy.  Neurological: lethargic Skin: Skin is warm and dry. No rash noted. No erythema.  Psychiatric: trached PICC RUE WNL    Lab Results Recent Labs    02/06/18 0430 02/06/18 1702 02/07/18 0455  WBC 15.5*  --  14.8*  HGB 7.2*  --  7.2*  HCT 22.8*  --  22.5*  NA  --  139 138  K  --  3.9 4.2  CL  --  98* 96*  CO2  --  36* 37*  BUN  --  18 20  CREATININE  --  0.55* 0.61    Microbiology: Results for orders placed or performed during the hospital encounter of 01/06/18  Blood Culture (routine x 2)     Status: None   Collection Time: 01/06/18  7:44 PM  Result Value Ref Range Status   Specimen Description BLOOD BLOOD LEFT WRIST  Final   Special Requests   Final    BOTTLES DRAWN AEROBIC AND ANAEROBIC Blood Culture adequate volume   Culture   Final    NO GROWTH 5 DAYS Performed at [facility], [address] [place], [place] [address]    Report Status 01/11/2018 FINAL  Final  Urine culture     Status: Abnormal   Collection Time: 01/06/18  7:44 PM  Result Value Ref Range Status   Specimen Description   Final    URINE, RANDOM Performed at [facility], [address], [place], [place] [address]    Special Requests   Final    NONE Performed at [facility], [address] [place], [place] [address]    Culture (A)  Final    10,000 COLONIES/mL STAPHYLOCOCCUS SPECIES (COAGULASE NEGATIVE) CALL MICROBIOLOGY LAB IF SENSITIVITIES ARE REQUIRED. Performed at [facility], [place] [address], [place], [place] [address]    Report Status 01/08/2018 FINAL  Final  MRSA PCR Screening     Status: Abnormal   Collection Time: 01/06/18 11:51 PM  Result Value Ref Range Status   MRSA by PCR POSITIVE (A) NEGATIVE Final    Comment:        The GeneXpert MRSA Assay (FDA approved  for NASAL specimens only), is one component of a  comprehensive MRSA colonization surveillance program. It is not intended to diagnose MRSA infection nor to guide or monitor treatment for MRSA infections. RESULT CALLED TO, READ BACK BY AND VERIFIED WITH: BETH BUONO 01/07/18 @ 0121  [place]   Performed at [facility], [place] [place], [place] [address]   Blood Culture (routine x 2)     Status: None   Collection Time: 01/07/18  7:19 AM  Result Value Ref Range Status   Specimen Description BLOOD RIGHT HAND  Final   Special Requests   Final    BOTTLES DRAWN AEROBIC AND ANAEROBIC Blood Culture results may not be optimal due to an inadequate volume of blood received in culture bottles   Culture   Final    NO GROWTH 5 DAYS Performed at [facility], [address], [place], [place] [address]    Report Status 01/12/2018 FINAL  Final  Culture, respiratory (NON-Expectorated)     Status: None   Collection Time: 01/07/18 12:28 PM  Result Value Ref Range Status   Specimen Description   Final    TRACHEAL ASPIRATE Performed at [facility], [address] [place], [place] [address]    Special Requests   Final    Normal Performed at [facility], [address] [place], [place] [address]    Gram Stain   Final    MODERATE WBC PRESENT,BOTH PMN AND MONONUCLEAR RARE GRAM POSITIVE COCCI RARE YEAST    Culture   Final    Consistent with normal respiratory flora. Performed at [facility], [place] [address] [place], [place] [address]    Report Status 01/09/2018 FINAL  Final  Culture, expectorated sputum-assessment     Status: None   Collection Time: 01/18/18  5:07 PM  Result Value Ref Range Status   Specimen Description ENDOTRACHEAL  Final   Special Requests NONE  Final   Sputum evaluation   Final    THIS SPECIMEN IS ACCEPTABLE FOR SPUTUM CULTURE Performed at [facility], [address], [place], [place] [address]    Report  Status 01/18/2018 FINAL  Final  Culture, respiratory (NON-Expectorated)     Status: None   Collection Time: 01/18/18  5:07 PM  Result Value Ref Range Status   Specimen Description   Final    ENDOTRACHEAL Performed at [facility], [address], [place], [place] [address]    Special Requests   Final    NONE Reflexed from [id number] Performed at [facility], [place] [place], [place] [address]    Gram Stain   Final    MODERATE WBC PRESENT,BOTH PMN AND MONONUCLEAR NO SQUAMOUS EPITHELIAL CELLS SEEN RARE BUDDING YEAST SEEN    Culture   Final    RARE Consistent with normal respiratory flora. Performed at [facility], [place] [address], [place], [place] [address]    Report Status 01/21/2018 FINAL  Final  MRSA PCR Screening     Status: Abnormal   Collection Time: 01/18/18  8:26 PM  Result Value Ref Range Status   MRSA by PCR POSITIVE (A) NEGATIVE Final    Comment:        The GeneXpert MRSA Assay (FDA approved for NASAL specimens only), is one component of a comprehensive MRSA colonization surveillance program. It is not intended to diagnose MRSA infection nor to guide or monitor treatment for MRSA infections. RESULT CALLED TO, READ BACK BY AND VERIFIED WITH: ANGELA BREHUN AT 2200 ON 01/18/18 RWW Performed at  [place], [place] [address]   CULTURE, BLOOD (ROUTINE X 2) w Reflex to ID Panel     Status: Abnormal   Collection Time: 01/31/18  8:17 AM  Result Value Ref Range Status   Specimen Description   Final    BLOOD LEFT HAND Performed at [facility], [place] [address] [place], [place] [address]    Special Requests   Final    BOTTLES DRAWN AEROBIC AND ANAEROBIC Blood Culture results may not be optimal due to an excessive volume of blood received in culture bottles Performed at [facility], [place] [place], [place] [address]    Culture  Setup Time   Final    GRAM NEGATIVE RODS AEROBIC  BOTTLE ONLY CRITICAL RESULT CALLED TO, READ BACK BY AND VERIFIED WITH: Retta Pitcher BESANTI ON 02/05/18 AT 0228 JAG Performed at [facility], [place] [address] [place], [place] [address]    Culture KLEBSIELLA PNEUMONIAE (A)  Final   Report Status 02/07/2018 FINAL  Final   Organism ID, Bacteria KLEBSIELLA PNEUMONIAE  Final      Susceptibility   Klebsiella pneumoniae - MIC*    AMPICILLIN >=32 RESISTANT Resistant     CEFAZOLIN <=4 SENSITIVE Sensitive     CEFEPIME <=1 SENSITIVE Sensitive     CEFTAZIDIME <=1 SENSITIVE Sensitive     CEFTRIAXONE <=1 SENSITIVE Sensitive     CIPROFLOXACIN <=0.25 SENSITIVE Sensitive     GENTAMICIN <=1 SENSITIVE Sensitive     IMIPENEM <=0.25 SENSITIVE Sensitive     TRIMETH/SULFA <=20 SENSITIVE Sensitive     AMPICILLIN/SULBACTAM >=32 RESISTANT Resistant     PIP/TAZO 16 SENSITIVE Sensitive     Extended ESBL NEGATIVE Sensitive     * KLEBSIELLA PNEUMONIAE  Blood Culture ID Panel (Reflexed)     Status: Abnormal   Collection Time: 01/31/18  8:17 AM  Result Value Ref Range Status   Enterococcus species NOT DETECTED NOT DETECTED Final   Listeria monocytogenes NOT DETECTED NOT DETECTED Final   Staphylococcus species NOT DETECTED NOT DETECTED Final   Staphylococcus aureus NOT DETECTED NOT DETECTED Final   Streptococcus species NOT DETECTED NOT DETECTED Final   Streptococcus agalactiae NOT DETECTED NOT DETECTED Final   Streptococcus pneumoniae NOT DETECTED NOT DETECTED Final   Streptococcus pyogenes NOT DETECTED NOT DETECTED Final   Acinetobacter baumannii NOT DETECTED NOT DETECTED Final   Enterobacteriaceae species DETECTED (A) NOT DETECTED Final    Comment: Enterobacteriaceae represent a large family of gram-negative bacteria, not a single organism. CRITICAL RESULT CALLED TO, READ BACK BY AND VERIFIED WITH: Garnell Begeman BESANTI ON 02/05/18 AT 0228 JAG    Enterobacter cloacae complex NOT DETECTED NOT DETECTED Final   Escherichia coli NOT DETECTED NOT DETECTED Final    Klebsiella oxytoca NOT DETECTED NOT DETECTED Final   Klebsiella pneumoniae DETECTED (A) NOT DETECTED Final    Comment: CRITICAL RESULT CALLED TO, READ BACK BY AND VERIFIED WITH: Cayenne Breault BESANTI ON 02/05/18 AT 0228 JAG    Proteus species NOT DETECTED NOT DETECTED Final   Serratia marcescens NOT DETECTED NOT DETECTED Final   Carbapenem resistance NOT DETECTED NOT DETECTED Final   Haemophilus influenzae NOT DETECTED NOT DETECTED Final   Neisseria meningitidis NOT DETECTED NOT DETECTED Final   Pseudomonas aeruginosa NOT DETECTED NOT DETECTED Final   Candida albicans NOT DETECTED NOT DETECTED Final   Candida glabrata NOT DETECTED NOT DETECTED Final   Candida krusei NOT DETECTED NOT DETECTED Final   Candida parapsilosis NOT  DETECTED NOT DETECTED Final   Candida tropicalis NOT DETECTED NOT DETECTED Final    Comment: Performed at [facility], [address], [place], [place] [address]  Urine Culture     Status: None   Collection Time: 01/31/18  8:30 AM  Result Value Ref Range Status   Specimen Description   Final    URINE, RANDOM Performed at [facility], [address], [place], [place] [address]    Special Requests   Final    NONE Performed at [facility], [address], [place], [place] [address]    Culture   Final    NO GROWTH Performed at [facility], [place] [address], [place], [place] [address]    Report Status 02/01/2018 FINAL  Final  CULTURE, BLOOD (ROUTINE X 2) w Reflex to ID Panel     Status: Abnormal   Collection Time: 01/31/18  8:53 AM  Result Value Ref Range Status   Specimen Description   Final    BLOOD BLOOD RIGHT HAND Performed at [facility], [address], [place], [place] [address]    Special Requests   Final    BOTTLES DRAWN AEROBIC AND ANAEROBIC Blood Culture adequate volume Performed at [facility], [address] [place], [place] [address]    Culture  Setup Time   Final    GRAM NEGATIVE  RODS AEROBIC BOTTLE ONLY CRITICAL RESULT CALLED TO, READ BACK BY AND VERIFIED WITH: Leniyah Martell BESANTI AT 0511 ON 02/05/18 [place]. Performed at [facility], [address] [place], [place] [address]    Culture (A)  Final    KLEBSIELLA PNEUMONIAE SUSCEPTIBILITIES PERFORMED ON PREVIOUS CULTURE WITHIN THE LAST 5 DAYS. Performed at [facility], [place] [address], [place], [place] [address]    Report Status 02/07/2018 FINAL  Final  Culture, respiratory (NON-Expectorated)     Status: None   Collection Time: 01/31/18 10:47 AM  Result Value Ref Range Status   Specimen Description   Final    TRACHEAL ASPIRATE Performed at [facility], [address], [place], [place] [address]    Special Requests   Final    NONE Performed at [facility], [place] [place], [place] [address]    Gram Stain   Final    MODERATE WBC PRESENT, PREDOMINANTLY PMN MODERATE GRAM POSITIVE COCCI MODERATE GRAM POSITIVE RODS    Culture   Final    Consistent with normal respiratory flora. Performed at [facility], [place] [address], [place], [place] [address]    Report Status 02/02/2018 FINAL  Final  CULTURE, BLOOD (ROUTINE X 2) w Reflex to ID Panel     Status: None (Preliminary result)   Collection Time: 02/04/18  5:41 PM  Result Value Ref Range Status   Specimen Description BLOOD BLOOD LEFT HAND  Final   Special Requests   Final    BOTTLES DRAWN AEROBIC AND ANAEROBIC Blood Culture adequate volume   Culture   Final    NO GROWTH 3 DAYS Performed at [facility], [address], [place],  [address]    Report Status PENDING  Incomplete  CULTURE, BLOOD (ROUTINE X 2) w Reflex to ID Panel     Status: None (Preliminary result)   Collection Time: 02/04/18  8:50 PM  Result Value Ref Range Status   Specimen Description BLOOD LEFT HAND  Final   Special Requests   Final    BOTTLES DRAWN AEROBIC AND ANAEROBIC  Blood Culture adequate volume   Culture   Final    NO  GROWTH 2 DAYS Performed at [facility], [address] [place], [place] [address]    Report Status PENDING  Incomplete  MRSA PCR Screening     Status: None   Collection Time: 02/05/18  9:38 AM  Result Value Ref Range Status   MRSA by PCR NEGATIVE NEGATIVE Final    Comment:        The GeneXpert MRSA Assay (FDA approved for NASAL specimens only), is one component of a comprehensive MRSA colonization surveillance program. It is not intended to diagnose MRSA infection nor to guide or monitor treatment for MRSA infections. Performed at [facility], [address] [place], [place] [address]     Studies/Results: Dg Chest Port 1 View  Result Date: 02/06/2018 CLINICAL DATA:  Intubated patient with sepsis, pneumonia and possible aspiration. EXAM: PORTABLE CHEST 1 VIEW COMPARISON:  Single-view of the chest 02/05/2018 and 02/04/2018. FINDINGS: Tracheostomy tube and right PICC remain in place. Diffuse bilateral airspace disease is unchanged. No pneumothorax or pleural effusion. IMPRESSION: No change in diffuse bilateral airspace disease. Electronically Signed   By: Inge Rise M.D.   On: 02/06/2018 11:04    Assessment/Plan: Tommy Mathews is a 57 y.o. 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 He has had a prolonged admission and is now trached and has PEG. He was treated with IV abx until 4/15 then again 4/20-21. Was then off of abx until developed fever 5/2 with wbc up some to 12. CXR however did not show any new opacities but has increasing resp requirements and increased sputum.  PICC line RUE changed. Prior sputum cx with Nml flora 4/20. Prior bcx neg. FU BCX and sputum pending.  5/6- defervesced, bcx neg, sputum cx negative. WBC down 17->12. Day 4 of vanco and mero. Imaging shows no change in severe patchy opacities bilaterally.  5/8 had fever and increased wbc. cx  now + klebsiellafrom 5/3 5/10 - no fevers, cx with Kleb sensitiive to multple abx   Recommendations Can stop vanco.  Would give 3 more days of coverage for the Kleb bacteremia- bcx + from 5/3 so stop date 5/13.  However can narrow to just ancef to complete coverage Thank you very much for the consult. Will follow with you.  Leonel Ramsay   02/07/2018, 6:43 PM

## 2018-02-07 NOTE — Progress Notes (Signed)
Pharmacy Antibiotic Note  Tommy Mathews is a 57 y.o. male being treated for possible ventilator assiociated pneumonia.  Pharmacy has been consulted for meropenem and vancomycin dosing. Patient previously on anidulafungin; last dose 5/6. Patient currently requiring mechanical ventilation and has a PEG. PICC line changed on 5/3.   Patient previously treated for aspiration pneumonia this admission.   Plan: Continue vancomycin  IV Q12hr for goal trough of 15-20. Will follow renal function daily while on vancomycin.   Continue Meropenem 1g IV Q8hr.   Per ID will plan to discontinue antibiotics on 5/10.    Height:  (177.8 cm) Weight: 293 lb 10.4 oz (133.2 kg) IBW/kg (Calculated) : 73  Adjusted Body Weight: 98kg   Temp (24hrs), Avg:99.2 F (37.3 C), Min:99.1 F (37.3 C), Max:99.4 F (37.4 C)  Recent Labs  Lab 02/02/18 0433 02/03/18 0441 02/03/18 1848 02/04/18 0835 02/05/18 0938 02/06/18 0430 02/06/18 1702 02/07/18 0455  WBC 13.1* 12.1*  --  17.4*  --  15.5*  --  14.8*  CREATININE 0.62 0.71  --  0.68 0.62  --  0.55* 0.61  VANCOTROUGH  --  28*  --   --  18  --   --   --   VANCORANDOM  --   --  12  --   --   --   --   --     Estimated Creatinine Clearance: 143.3 mL/min (by C-G formula based on SCr of 0.61 mg/dL).    No Known Allergies  Antimicrobials this admission: Vancomycin 5/3 >> 5/10 Meropenem 5/3 >> 5/10 Anidulafungin 5/3 >> 5/6   Microbiology results: 5/3 BCx: Klebsiella Pneumoniae 5/7 BCx: no growth x 3 days 5/3 UCx: NG  5/3 Tracheal Aspirate: mod GPC, GPR  4/20 MRSA PCR: positive  Thank you for allowing pharmacy to be a part of this patient's care.  Simpson,Michael L 02/07/2018 8:49 PM

## 2018-02-07 NOTE — Progress Notes (Signed)
RN spoke with MD in person and made MD aware that patient's heart rate has been running upper 50's to low 60's on precedex drip and that Metoprolol 25 mg is due q6H.  Dr. Lonn [place] gave order to hold metoprolol for heart rate less than 65.

## 2018-02-07 NOTE — Progress Notes (Signed)
OT Cancellation Note  Patient Details Name: Tommy Mathews DOB: June 20, 1961   Cancelled Treatment:    Reason Eval/Treat Not Completed: Medical issues which prohibited therapy;Patient not medically ready. Spoke with PT. Per discussion with nursing, pt not appropriate for therapy at this time d/t respiratory status concerns.  Pt currently requiring 100% FiO2 and 10 of PEEP.  Also on Precedex drip.  Will hold therapy over the weekend and re-assess pt's status on Monday for appropriateness to participate in OT--nursing notified (will need re-eval if appropriate for therapy participation).  Richrd Prime, MPH, MS, OTR/L ascom [phone] 02/07/18, 1:09 PM

## 2018-02-07 NOTE — Progress Notes (Signed)
Report given to [facility], RN who is now taking over patient's care.

## 2018-02-07 NOTE — Progress Notes (Signed)
Pharmacy Electrolyte Monitorin25g Consult:  Pharmacy consulted to assist in monitoring and replacing electrolytes in this 57 y.o. male admitted on 01/06/2018   Patient requiring mechanical ventilation and has PEG tube.   Patient ordered furosemide  IV daily.  Labs:  Sodium (mmol/L)  Date Value  02/07/2018 138   Potassium (mmol/L)  Date Value  02/07/2018 4.2   Magnesium (mg/dL)  Date Value  45/40/9811 1.8   Phosphorus (mg/dL)  Date Value  91/47/8295 3.9   Calcium (mg/dL)  Date Value  62/13/0865 8.0 (L)   Albumin (g/dL)  Date Value  78/46/9629 1.6 (L)    Plan: No replacement warranted at this time.   Will recheck electrolytes with am labs on 5/11. If no replacement warranted will resume Q48hr electrolyte checks.   Pharmacy will continue to monitor and adjust per consult.   Jyssica Rief L,  02/07/2018 8:47 PM

## 2018-02-07 NOTE — Progress Notes (Signed)
Follow up - Critical Care Medicine Note  Patient Details:    Tommy Mathews is an 57 y.o. male.with a past medical history remarkable for diabetes, gastroesophageal reflux disease, hyperlipidemia, hypertension, traumatic brain injury, status post tracheostomy and PEG tube placement, is in the intensive care unit for sepsis, severe pneumonia, suspected aspiration  Lines, Airways, Drains: PICC Triple Lumen 01/31/18 PICC Right 46 cm 0 cm (Active)  Indication for Insertion or Continuance of Line Administration of hyperosmolar/irritating solutions (i.e. TPN, Vancomycin, etc.) 02/03/2018  8:00 AM  Exposed Catheter (cm) 0 cm 01/31/2018 12:00 PM  Site Assessment Clean;Dry;Intact 02/02/2018  8:50 PM  Lumen #1 Status Infusing 02/02/2018  8:50 PM  Lumen #2 Status Infusing 02/02/2018  8:50 PM  Lumen #3 Status Saline locked 02/02/2018  8:50 PM  Dressing Type Transparent 02/02/2018  8:50 PM  Dressing Status Clean;Dry;Intact 02/02/2018  8:50 PM  Line Care Connections checked and tightened 02/02/2018  8:50 PM  Line Adjustment (NICU/IV Team Only) No 02/01/2018 12:39 PM  Dressing Change Due 02/07/18 02/02/2018 11:06 AM     Gastrostomy/Enterostomy PEG-jejunostomy (Active)  Surrounding Skin Dry;Intact 02/03/2018  3:42 AM  Tube Status Patent 02/03/2018  3:42 AM  Drainage Appearance None 02/03/2018  3:42 AM  Dressing Status Clean;Dry;Intact 02/03/2018  3:42 AM  Dressing Intervention New dressing 01/31/2018  6:30 PM  Dressing Type Split gauze 02/03/2018  3:42 AM  Dressing Change Due 02/06/18 02/02/2018 11:30 PM  G Port Intake (mL) 80 ml 01/30/2018  5:30 PM     Urethral Catheter (Active)  Indication for Insertion or Continuance of Catheter Bladder outlet obstruction / other urologic reason 02/03/2018  3:42 AM  Site Assessment Intact;Clean 02/03/2018  3:42 AM  Catheter Maintenance Bag below level of bladder;No dependent loops;Seal intact;Bag emptied prior to transport 02/03/2018  3:42 AM  Collection Container Standard drainage bag 02/03/2018  3:42 AM   Securement Method Leg strap 02/03/2018  3:42 AM  Urinary Catheter Interventions Unclamped 02/03/2018  3:42 AM  Input (mL) 0 mL 01/15/2018 11:47 PM  Output (mL) 675 mL 02/03/2018  6:00 AM    Anti-infectives:  Anti-infectives (From admission, onward)   Start     Dose/Rate Route Frequency Ordered Stop   02/03/18 2100  vancomycin (VANCOCIN) 1,500 mg in sodium chloride 0.9 % 500 mL IVPB     1,500 mg 250 mL/hr over 120 Minutes Intravenous Every 12 hours 02/03/18 1950     02/03/18 0522  vancomycin (VANCOCIN) 1,500 mg in sodium chloride 0.9 % 500 mL IVPB  Status:  Discontinued     1,500 mg 250 mL/hr over 120 Minutes Intravenous As needed 02/03/18 0523 02/03/18 1950   02/01/18 2100  vancomycin (VANCOCIN) 1,500 mg in sodium chloride 0.9 % 500 mL IVPB  Status:  Discontinued     1,500 mg 250 mL/hr over 120 Minutes Intravenous Every 8 hours 02/01/18 2038 02/03/18 0523   02/01/18 1800  anidulafungin (ERAXIS) 100 mg in sodium chloride 0.9 % 100 mL IVPB  Status:  Discontinued     100 mg 78 mL/hr over 100 Minutes Intravenous Every 24 hours 01/31/18 1734 02/04/18 0911   02/01/18 1500  anidulafungin (ERAXIS) 100 mg in sodium chloride 0.9 % 100 mL IVPB  Status:  Discontinued     100 mg 78 mL/hr over 100 Minutes Intravenous Every 24 hours 01/31/18 1540 01/31/18 1734   01/31/18 2000  vancomycin (VANCOCIN) 1,250 mg in sodium chloride 0.9 % 250 mL IVPB  Status:  Discontinued     1,250 mg 166.7 mL/hr over  90 Minutes Intravenous Every 8 hours 01/31/18 1734 02/01/18 2038   01/31/18 1900  vancomycin (VANCOCIN) 1,250 mg in sodium chloride 0.9 % 250 mL IVPB  Status:  Discontinued     1,250 mg 166.7 mL/hr over 90 Minutes Intravenous Every 8 hours 01/31/18 1728 01/31/18 1734   01/31/18 1545  anidulafungin (ERAXIS) 200 mg in sodium chloride 0.9 % 200 mL IVPB     200 mg 78 mL/hr over 200 Minutes Intravenous  Once 01/31/18 1540 01/31/18 2107   01/31/18 0930  vancomycin (VANCOCIN) IVPB 1000 mg/200 mL premix     1,000  mg 200 mL/hr over 60 Minutes Intravenous STAT 01/31/18 0922 01/31/18 1545   01/31/18 0800  meropenem (MERREM) 1 g in sodium chloride 0.9 % 100 mL IVPB     1 g 200 mL/hr over 30 Minutes Intravenous Every 8 hours 01/31/18 0748 02/07/18 2359   01/21/18 0600  ceFAZolin (ANCEF) 3 g in dextrose 5 % 50 mL IVPB     3 g 130 mL/hr over 30 Minutes Intravenous  Once 01/20/18 1829 01/21/18 0654   01/20/18 0600  vancomycin (VANCOCIN) 1,250 mg in sodium chloride 0.9 % 250 mL IVPB  Status:  Discontinued     1,250 mg 166.7 mL/hr over 90 Minutes Intravenous Every 12 hours 01/20/18 0153 01/20/18 0905   01/19/18 0200  vancomycin (VANCOCIN) 1,250 mg in sodium chloride 0.9 % 250 mL IVPB  Status:  Discontinued     1,250 mg 166.7 mL/hr over 90 Minutes Intravenous Every 8 hours 01/18/18 1953 01/20/18 0153   01/18/18 2000  vancomycin (VANCOCIN) 1,250 mg in sodium chloride 0.9 % 250 mL IVPB     1,250 mg 166.7 mL/hr over 90 Minutes Intravenous  Once 01/18/18 1953 01/18/18 2246   01/18/18 2000  ceFEPIme (MAXIPIME) 2 g in sodium chloride 0.9 % 100 mL IVPB  Status:  Discontinued     2 g 200 mL/hr over 30 Minutes Intravenous Every 8 hours 01/18/18 1953 01/20/18 0905   01/12/18 0900  vancomycin (VANCOCIN) IVPB 1000 mg/200 mL premix  Status:  Discontinued     1,000 mg 200 mL/hr over 60 Minutes Intravenous Every 24 hours 01/11/18 1329 01/13/18 1548   01/10/18 1000  ceFEPIme (MAXIPIME) 2 g in sodium chloride 0.9 % 100 mL IVPB     2 g 200 mL/hr over 30 Minutes Intravenous Every 12 hours 01/10/18 0919 01/13/18 2129   01/09/18 2200  vancomycin (VANCOCIN) IVPB 750 mg/150 ml premix  Status:  Discontinued     750 mg 150 mL/hr over 60 Minutes Intravenous Every 12 hours 01/09/18 1952 01/11/18 1329   01/08/18 1503  Ampicillin-Sulbactam (UNASYN) 3 g in sodium chloride 0.9 % 100 mL IVPB  Status:  Discontinued     3 g 200 mL/hr over 30 Minutes Intravenous Every 6 hours 01/08/18 0931 01/08/18 1024   01/08/18 1230  ceFEPIme  (MAXIPIME) 2 g in sodium chloride 0.9 % 100 mL IVPB  Status:  Discontinued     2 g 200 mL/hr over 30 Minutes Intravenous Every 12 hours 01/08/18 1151 01/10/18 0919   01/08/18 1100  metroNIDAZOLE (FLAGYL) IVPB 500 mg  Status:  Discontinued     500 mg 100 mL/hr over 60 Minutes Intravenous Every 8 hours 01/08/18 1024 01/13/18 1547   01/08/18 0800  Ampicillin-Sulbactam (UNASYN) 3 g in sodium chloride 0.9 % 100 mL IVPB  Status:  Discontinued     3 g 200 mL/hr over 30 Minutes Intravenous Every 12 hours 01/07/18 1906 01/08/18 0931  01/08/18 0600  vancomycin (VANCOCIN) IVPB 1000 mg/200 mL premix  Status:  Discontinued     1,000 mg 200 mL/hr over 60 Minutes Intravenous Every 12 hours 01/08/18 0545 01/09/18 1952   01/07/18 2100  ceFEPIme (MAXIPIME) 2 g in sodium chloride 0.9 % 100 mL IVPB  Status:  Discontinued     2 g 200 mL/hr over 30 Minutes Intravenous Every 24 hours 01/06/18 2349 01/07/18 1008   01/07/18 1800  Ampicillin-Sulbactam (UNASYN) 3 g in sodium chloride 0.9 % 100 mL IVPB  Status:  Discontinued     3 g 200 mL/hr over 30 Minutes Intravenous Every 6 hours 01/07/18 1436 01/07/18 1906   01/07/18 1015  Ampicillin-Sulbactam (UNASYN) 3 g in sodium chloride 0.9 % 100 mL IVPB  Status:  Discontinued     3 g 200 mL/hr over 30 Minutes Intravenous Every 12 hours 01/07/18 1008 01/07/18 1436   01/07/18 0600  vancomycin (VANCOCIN) 1,250 mg in sodium chloride 0.9 % 250 mL IVPB  Status:  Discontinued     1,250 mg 166.7 mL/hr over 90 Minutes Intravenous Every 24 hours 01/06/18 2349 01/07/18 1917   01/06/18 2100  ceFEPIme (MAXIPIME) 2 g in sodium chloride 0.9 % 100 mL IVPB     2 g 200 mL/hr over 30 Minutes Intravenous  Once 01/06/18 2056 01/06/18 2320   01/06/18 2100  vancomycin (VANCOCIN) IVPB 1000 mg/200 mL premix     1,000 mg 200 mL/hr over 60 Minutes Intravenous  Once 01/06/18 2056 01/07/18 0024      Microbiology: Results for orders placed or performed during the hospital encounter of 01/06/18   Blood Culture (routine x 2)     Status: None   Collection Time: 01/06/18  7:44 PM  Result Value Ref Range Status   Specimen Description BLOOD BLOOD LEFT WRIST  Final   Special Requests   Final    BOTTLES DRAWN AEROBIC AND ANAEROBIC Blood Culture adequate volume   Culture   Final    NO GROWTH 5 DAYS Performed at [facility], [address], [place], [place] [address]    Report Status 01/11/2018 FINAL  Final  Urine culture     Status: Abnormal   Collection Time: 01/06/18  7:44 PM  Result Value Ref Range Status   Specimen Description   Final    URINE, RANDOM Performed at [facility], [address], [place], [place] [address]    Special Requests   Final    NONE Performed at [facility], [address] [place], [place] [address]    Culture (A)  Final    10,000 COLONIES/mL STAPHYLOCOCCUS SPECIES (COAGULASE NEGATIVE) CALL MICROBIOLOGY LAB IF SENSITIVITIES ARE REQUIRED. Performed at [facility], [address] [place], [place] [address]    Report Status 01/08/2018 FINAL  Final  MRSA PCR Screening     Status: Abnormal   Collection Time: 01/06/18 11:51 PM  Result Value Ref Range Status   MRSA by PCR POSITIVE (A) NEGATIVE Final    Comment:        The GeneXpert MRSA Assay (FDA approved for NASAL specimens only), is one component of a comprehensive MRSA colonization surveillance program. It is not intended to diagnose MRSA infection nor to guide or monitor treatment for MRSA infections. RESULT CALLED TO, READ BACK BY AND VERIFIED WITH: BETH BUONO 01/07/18 @ 0121  MLK   Performed at [facility], [address], [place], [place] [address]   Blood Culture (routine x 2)  Status: None   Collection Time: 01/07/18  7:19 AM  Result Value Ref Range Status   Specimen Description BLOOD RIGHT HAND  Final   Special Requests   Final    BOTTLES DRAWN AEROBIC AND ANAEROBIC Blood Culture results may not be optimal due to an inadequate  volume of blood received in culture bottles   Culture   Final    NO GROWTH 5 DAYS Performed at [facility], [address] [place], [place] [address]    Report Status 01/12/2018 FINAL  Final  Culture, respiratory (NON-Expectorated)     Status: None   Collection Time: 01/07/18 12:28 PM  Result Value Ref Range Status   Specimen Description   Final    TRACHEAL ASPIRATE Performed at [facility], [address], [place], [place] [address]    Special Requests   Final    Normal Performed at [facility], [address] [place], [place] [address]    Gram Stain   Final    MODERATE WBC PRESENT,BOTH PMN AND MONONUCLEAR RARE GRAM POSITIVE COCCI RARE YEAST    Culture   Final    Consistent with normal respiratory flora. Performed at [facility], [address], [place], [place] [address]    Report Status 01/09/2018 FINAL  Final  Culture, expectorated sputum-assessment     Status: None   Collection Time: 01/18/18  5:07 PM  Result Value Ref Range Status   Specimen Description ENDOTRACHEAL  Final   Special Requests NONE  Final   Sputum evaluation   Final    THIS SPECIMEN IS ACCEPTABLE FOR SPUTUM CULTURE Performed at [facility], [address], [place], [place] [address]    Report Status 01/18/2018 FINAL  Final  Culture, respiratory (NON-Expectorated)     Status: None   Collection Time: 01/18/18  5:07 PM  Result Value Ref Range Status   Specimen Description   Final    ENDOTRACHEAL Performed at [facility], [address], [place], [place] [address]    Special Requests   Final    NONE Reflexed from [id number] Performed at [facility], [address] [place], [place] [address]    Gram Stain   Final    MODERATE WBC PRESENT,BOTH PMN AND MONONUCLEAR NO SQUAMOUS EPITHELIAL CELLS SEEN RARE BUDDING YEAST SEEN    Culture   Final    RARE Consistent with normal respiratory flora. Performed at [facility],  [address], [place], [place] [address]    Report Status 01/21/2018 FINAL  Final  MRSA PCR Screening     Status: Abnormal   Collection Time: 01/18/18  8:26 PM  Result Value Ref Range Status   MRSA by PCR POSITIVE (A) NEGATIVE Final    Comment:        The GeneXpert MRSA Assay (FDA approved for NASAL specimens only), is one component of a comprehensive MRSA colonization surveillance program. It is not intended to diagnose MRSA infection nor to guide or monitor treatment for MRSA infections. RESULT CALLED TO, READ BACK BY AND VERIFIED WITH: ANGELA BREHUN AT 2200 ON 01/18/18 RWW Performed at [facility], [address] [place], [place] [address]   CULTURE, BLOOD (ROUTINE X 2) w Reflex to ID Panel     Status: Abnormal (Preliminary result)   Collection Time: 01/31/18  8:17 AM  Result Value Ref Range Status   Specimen Description   Final    BLOOD LEFT HAND Performed at [facility], [address] [place], [place] [address]    Special Requests   Final    BOTTLES DRAWN AEROBIC AND ANAEROBIC Blood Culture results may not be optimal due to an excessive volume of blood received in culture bottles Performed at [facility], [address] [place], [place] [address]    Culture  Setup Time   Final    GRAM NEGATIVE RODS AEROBIC BOTTLE ONLY CRITICAL RESULT CALLED TO, READ BACK BY AND VERIFIED WITH: DAVID BESANTI ON 02/05/18 AT 0228 JAG    Culture (A)  Final    KLEBSIELLA PNEUMONIAE SUSCEPTIBILITIES TO FOLLOW Performed at [facility], [address], [place], [place] [address]    Report Status PENDING  Incomplete  Blood Culture ID Panel (Reflexed)     Status: Abnormal   Collection Time: 01/31/18  8:17 AM  Result Value Ref Range Status   Enterococcus species NOT DETECTED NOT DETECTED Final   Listeria monocytogenes NOT DETECTED NOT DETECTED Final   Staphylococcus species NOT DETECTED NOT DETECTED Final   Staphylococcus aureus NOT DETECTED NOT DETECTED  Final   Streptococcus species NOT DETECTED NOT DETECTED Final   Streptococcus agalactiae NOT DETECTED NOT DETECTED Final   Streptococcus pneumoniae NOT DETECTED NOT DETECTED Final   Streptococcus pyogenes NOT DETECTED NOT DETECTED Final   Acinetobacter baumannii NOT DETECTED NOT DETECTED Final   Enterobacteriaceae species DETECTED (A) NOT DETECTED Final    Comment: Enterobacteriaceae represent a large family of gram-negative bacteria, not a single organism. CRITICAL RESULT CALLED TO, READ BACK BY AND VERIFIED WITH: DAVID BESANTI ON 02/05/18 AT 0228 JAG    Enterobacter cloacae complex NOT DETECTED NOT DETECTED Final   Escherichia coli NOT DETECTED NOT DETECTED Final   Klebsiella oxytoca NOT DETECTED NOT DETECTED Final   Klebsiella pneumoniae DETECTED (A) NOT DETECTED Final    Comment: CRITICAL RESULT CALLED TO, READ BACK BY AND VERIFIED WITH: DAVID BESANTI ON 02/05/18 AT 0228 JAG    Proteus species NOT DETECTED NOT DETECTED Final   Serratia marcescens NOT DETECTED NOT DETECTED Final   Carbapenem resistance NOT DETECTED NOT DETECTED Final   Haemophilus influenzae NOT DETECTED NOT DETECTED Final   Neisseria meningitidis NOT DETECTED NOT DETECTED Final   Pseudomonas aeruginosa NOT DETECTED NOT DETECTED Final   Candida albicans NOT DETECTED NOT DETECTED Final   Candida glabrata NOT DETECTED NOT DETECTED Final   Candida krusei NOT DETECTED NOT DETECTED Final   Candida parapsilosis NOT DETECTED NOT DETECTED Final   Candida tropicalis NOT DETECTED NOT DETECTED Final    Comment: Performed at [facility], [address] [place], [place] [address]  Urine Culture     Status: None   Collection Time: 01/31/18  8:30 AM  Result Value Ref Range Status   Specimen Description   Final    URINE, RANDOM Performed at [facility], [address] [place], [place] [address]    Special Requests   Final    NONE Performed at [facility], [address] [place],  [place] [address]    Culture   Final    NO GROWTH Performed at [facility], [address] [place], [place] [address]    Report Status 02/01/2018 FINAL  Final  CULTURE, BLOOD (ROUTINE X 2) w Reflex to ID Panel     Status: Abnormal (Preliminary result)   Collection Time: 01/31/18  8:53 AM  Result Value Ref Range Status   Specimen Description   Final  BLOOD BLOOD RIGHT HAND Performed at [facility], [address] [place], [place] [address]    Special Requests   Final    BOTTLES DRAWN AEROBIC AND ANAEROBIC Blood Culture adequate volume Performed at [facility], [address] [place], [place] [address]    Culture  Setup Time   Final    GRAM NEGATIVE RODS AEROBIC BOTTLE ONLY CRITICAL RESULT CALLED TO, READ BACK BY AND VERIFIED WITH: DAVID BESANTI AT 0340 ON 02/05/18 [facility]. Performed at [facility], [address] [place], [place] [address]    Culture KLEBSIELLA PNEUMONIAE (A)  Final   Report Status PENDING  Incomplete  Culture, respiratory (NON-Expectorated)     Status: None   Collection Time: 01/31/18 10:47 AM  Result Value Ref Range Status   Specimen Description   Final    TRACHEAL ASPIRATE Performed at [facility], [address] [place], [place] [address]    Special Requests   Final    NONE Performed at [facility], [address] [place], [place] [address]    Gram Stain   Final    MODERATE WBC PRESENT, PREDOMINANTLY PMN MODERATE GRAM POSITIVE COCCI MODERATE GRAM POSITIVE RODS    Culture   Final    Consistent with normal respiratory flora. Performed at [facility], [address] [place], [place] [address]    Report Status 02/02/2018 FINAL  Final  CULTURE, BLOOD (ROUTINE X 2) w Reflex to ID Panel     Status: None (Preliminary result)   Collection Time: 02/04/18  5:41 PM  Result Value Ref Range Status   Specimen Description BLOOD BLOOD LEFT HAND  Final   Special Requests   Final    BOTTLES DRAWN  AEROBIC AND ANAEROBIC Blood Culture adequate volume   Culture   Final    NO GROWTH 3 DAYS Performed at [facility], [address], [place], [place] [address]    Report Status PENDING  Incomplete  CULTURE, BLOOD (ROUTINE X 2) w Reflex to ID Panel     Status: None (Preliminary result)   Collection Time: 02/04/18  8:50 PM  Result Value Ref Range Status   Specimen Description BLOOD LEFT HAND  Final   Special Requests   Final    BOTTLES DRAWN AEROBIC AND ANAEROBIC Blood Culture adequate volume   Culture   Final    NO GROWTH 2 DAYS Performed at [facility], [address] [place], [place] [address]    Report Status PENDING  Incomplete  MRSA PCR Screening     Status: None   Collection Time: 02/05/18  9:38 AM  Result Value Ref Range Status   MRSA by PCR NEGATIVE NEGATIVE Final    Comment:        The GeneXpert MRSA Assay (FDA approved for NASAL specimens only), is one component of a comprehensive MRSA colonization surveillance program. It is not intended to diagnose MRSA infection nor to guide or monitor treatment for MRSA infections. Performed at [facility], [address] [place], [place] [address]     Best Practice/Protocols:    Events:  Studies: Dg Abd 1 View  Result Date: 01/27/2018 CLINICAL DATA:  Ileus EXAM: ABDOMEN - 1 VIEW COMPARISON:  01/24/2018 FINDINGS: Diffuse gaseous distention of bowel again noted compatible with ileus, unchanged. No free air or organomegaly. No acute bony abnormality. IMPRESSION: Stable ileus pattern. Electronically Signed   By: Charlett Nose M.D.   On: 01/27/2018 07:51   Dg  Abd 1 View  Result Date: 01/24/2018 CLINICAL DATA:  Constipation EXAM: ABDOMEN - 1 VIEW COMPARISON:  01/13/2018 FINDINGS: A gastrostomy tube is demonstrated over the stomach. There is mild gaseous distention of the stomach, colon, and small bowel. Prominent stool in the rectum. Changes may be due to adynamic ileus or pseudo-obstruction from  impacted rectal stool. No radiopaque stones. Infiltration or atelectasis in the lung bases. IMPRESSION: Mildly gas distended stomach, small bowel, and colon with prominent stool in the rectum suggesting adynamic ileus or pseudo-obstruction due to stool impaction. Electronically Signed   By: Burman Nieves M.D.   On: 01/24/2018 23:47   Dg Abd 1 View  Result Date: 01/13/2018 CLINICAL DATA:  Orogastric tube placement EXAM: ABDOMEN - 1 VIEW COMPARISON:  CT 01/06/2018 FINDINGS: Consolidation at the left lung base. Esophageal tube tip projects over the proximal stomach. Air-filled dilated bowel in the upper abdomen. IMPRESSION: Esophageal tube tip overlies the proximal stomach. There is consolidation at the left lung base. Electronically Signed   By: Jasmine Pang M.D.   On: 01/13/2018 21:32   Dg Chest Port 1 View  Result Date: 02/06/2018 CLINICAL DATA:  Intubated patient with sepsis, pneumonia and possible aspiration. EXAM: PORTABLE CHEST 1 VIEW COMPARISON:  Single-view of the chest 02/05/2018 and 02/04/2018. FINDINGS: Tracheostomy tube and right PICC remain in place. Diffuse bilateral airspace disease is unchanged. No pneumothorax or pleural effusion. IMPRESSION: No change in diffuse bilateral airspace disease. Electronically Signed   By: Drusilla Kanner M.D.   On: 02/06/2018 11:04   Dg Chest Port 1 View  Result Date: 02/05/2018 CLINICAL DATA:  57 year old male with acute respiratory failure. EXAM: PORTABLE CHEST 1 VIEW COMPARISON:  Chest radiograph dated 02/04/2018 FINDINGS: Right-sided PICC in similar position. Tracheostomy above the carina. Diffuse airspace opacity throughout the lungs with no significant interval change compared to the prior radiograph. No pneumothorax. The cardiac borders are silhouetted. No acute osseous pathology. IMPRESSION: No significant interval change. Electronically Signed   By: Elgie Collard M.D.   On: 02/05/2018 06:22   Dg Chest Port 1 View  Result Date:  02/04/2018 CLINICAL DATA:  Acute respiratory failure, pulmonary edema, sepsis, hypertension, diabetes, current smoker. EXAM: PORTABLE CHEST 1 VIEW COMPARISON:  Portable chest x-ray of Feb 03, 2018 FINDINGS: Diffuse airspace opacities have worsened bilaterally. The cardiac silhouette is mildly enlarged. The pulmonary vascularity is indistinct. There is calcification in the wall of the aortic arch. There may be pleural fluid layering posteriorly on the left. The tracheostomy tube tip projects between the clavicular heads. The right sided PICC line tip projects over the midportion of the SVC. IMPRESSION: Worsening airspace opacities bilaterally consistent with progressive pneumonia or pulmonary edema. Thoracic aortic atherosclerosis. Electronically Signed   By: David  [place] M.D.   On: 02/04/2018 10:28   Dg Chest Port 1 View  Result Date: 02/03/2018 CLINICAL DATA:  Follow-up pneumonia EXAM: PORTABLE CHEST 1 VIEW COMPARISON:  02/02/2018 FINDINGS: Cardiac shadow is stable. Right-sided PICC line and tracheostomy tube are noted and stable. Diffuse bilateral infiltrates are again seen and stable. No bony abnormality is noted. IMPRESSION: Stable appearance when compared with the previous exam. Electronically Signed   By: Alcide Clever M.D.   On: 02/03/2018 07:15   Dg Chest Port 1 View  Result Date: 02/02/2018 CLINICAL DATA:  Pneumonia EXAM: PORTABLE CHEST 1 VIEW COMPARISON:  Chest radiograph from one day prior. FINDINGS: Tracheostomy tube tip overlies the tracheal air column at the thoracic inlet. Right PICC terminates at the cavoatrial junction.  Stable cardiomediastinal silhouette with normal heart size. No pneumothorax. No pleural effusion. Severe patchy opacity throughout both lungs is not appreciably changed. IMPRESSION: 1. Stable support structures as detailed. 2. Stable severe diffuse patchy lung opacities suggesting multilobar pneumonia and/or ARDS. Electronically Signed   By: Delbert Phenix M.D.   On: 02/02/2018  07:14   Dg Chest Port 1 View  Result Date: 02/01/2018 CLINICAL DATA:  Pneumonia EXAM: PORTABLE CHEST 1 VIEW COMPARISON:  Chest radiograph from one day prior. FINDINGS: Tracheostomy tube tip overlies the tracheal air column at the thoracic inlet. Right PICC terminates at the cavoatrial junction. Stable cardiomediastinal silhouette with normal heart size. No pneumothorax. No pleural effusion. Hazy airspace opacities throughout both lungs are slightly improved. IMPRESSION: Hazy airspace opacities throughout both lungs, slightly improved. Electronically Signed   By: Delbert Phenix M.D.   On: 02/01/2018 09:03   Dg Chest Port 1 View  Result Date: 01/31/2018 CLINICAL DATA:  PICC line placement. EXAM: PORTABLE CHEST 1 VIEW COMPARISON:  01/31/2018 and 01/30/2018. FINDINGS: 1225 hours. Right arm PICC extends to the level of the superior cavoatrial junction. Tracheostomy appears unchanged. There are stable diffuse bilateral pulmonary opacities. No pneumothorax or significant pleural effusion. The heart size and mediastinal contours are grossly stable, partially obscured by the airspace opacities. IMPRESSION: PICC line terminates at the level of the superior cavoatrial junction. Persistent bilateral airspace opacities. Electronically Signed   By: Carey Bullocks M.D.   On: 01/31/2018 12:52   Dg Chest Port 1 View  Result Date: 01/31/2018 CLINICAL DATA:  Congestive heart failure. EXAM: PORTABLE CHEST 1 VIEW COMPARISON:  Radiograph of Jan 30, 2018. FINDINGS: Stable cardiomediastinal silhouette. Tracheostomy tube and right sided PICC line are unchanged in position. Hypoinflation of the lungs is noted. Stable bilateral lung opacities are noted concerning for edema or pneumonia. No pneumothorax or significant pleural effusion is noted. Bony thorax is unremarkable. IMPRESSION: Stable support apparatus. Hypoinflation of the lungs. Stable bilateral lung opacities concerning for edema or pneumonia. Electronically Signed   By: Lupita Raider, M.D.   On: 01/31/2018 07:13   Dg Chest Port 1 View  Result Date: 01/30/2018 CLINICAL DATA:  Congestive heart failure EXAM: PORTABLE CHEST 1 VIEW COMPARISON:  Jan 29, 2018 FINDINGS: Tracheostomy catheter tip is 5.6 cm above the carina. Central catheter tip is in the superior vena cava. No pneumothorax. There remains cardiomegaly with pulmonary venous hypertension. There is patchy interstitial and alveolar opacity, likely edema. No adenopathy. No bone lesions. IMPRESSION: Tube and catheter positions as described without pneumothorax. Pulmonary vascular congestion with interstitial and alveolar edema, stable from 1 day prior. No new opacity evident. Electronically Signed   By: [name].   On: 01/30/2018 09:07   Dg Chest Port 1 View  Result Date: 01/29/2018 CLINICAL DATA:  Shortness of breath EXAM: PORTABLE CHEST 1 VIEW COMPARISON:  January 28, 2018 FINDINGS: Tracheostomy catheter tip is 5.0 cm above the carina. Central catheter tip is in the superior vena cava. No pneumothorax. There is diffuse airspace opacity throughout the lungs bilaterally, similar to 1 day prior. There are small pleural effusions bilaterally. No new opacity evident. Heart is enlarged with pulmonary venous hypertension. No adenopathy appreciable. No bone lesions. IMPRESSION: Tube and catheter positions as described without pneumothorax. Cardiomegaly with pulmonary venous hypertension, consistent with pulmonary vascular congestion. Small pleural effusions bilaterally. Widespread airspace opacity bilaterally may represent alveolar edema or pneumonia. Both entities may exist concurrently. A degree of underlying ARDS is also possible. The overall  appearance is essentially stable compared to 1 day prior. Electronically Signed   By: [name].   On: 01/29/2018 07:45   Dg Chest Port 1 View  Result Date: 01/28/2018 CLINICAL DATA:  Pneumonia EXAM: PORTABLE CHEST 1 VIEW COMPARISON:  01/23/2018 FINDINGS:  Tracheostomy and right PICC line remain in place, unchanged. Cardiomegaly. Vascular congestion and diffuse bilateral airspace opacities are again noted, unchanged. Low lung volumes. No visible effusions or acute bony abnormality. IMPRESSION: Cardiomegaly with vascular congestion and diffuse bilateral airspace disease which could reflect edema or infection. No change. Electronically Signed   By: Charlett Nose M.D.   On: 01/28/2018 09:21   Dg Chest Port 1 View  Result Date: 01/23/2018 CLINICAL DATA:  Tracheostomy placement EXAM: PORTABLE CHEST 1 VIEW COMPARISON:  January 21, 2018 FINDINGS: Tracheostomy catheter tip is 4.1 cm above the carina. Central catheter tip is at the cavoatrial junction. No pneumothorax. There is persistent moderate interstitial and patchy alveolar edema with small pleural effusions bilaterally. Heart is mildly enlarged with pulmonary venous hypertension. No adenopathy. No bone lesions. IMPRESSION: Tube and catheter positions as described without pneumothorax. Evidence of a degree of congestive heart failure, similar to most recent prior study. Electronically Signed   By: [name].   On: 01/23/2018 12:06   Dg Chest Port 1 View  Result Date: 01/21/2018 CLINICAL DATA:  Hypoxia EXAM: PORTABLE CHEST 1 VIEW COMPARISON:  January 19, 2018 FINDINGS: Endotracheal tube tip is 3.7 cm above the carina. Nasogastric tube tip and side port are below the diaphragm. Central catheter tip is in the superior vena cava. No pneumothorax. There is moderate interstitial and patchy alveolar opacities, slightly increased. There is a small left pleural effusion. Heart is upper normal in size with pulmonary venous hypertension. No adenopathy. No bone lesions. IMPRESSION: Tube and catheter positions as described without pneumothorax. Increase interstitial and patchy alveolar pulmonary edema, likely indicative of a degree of congestive heart failure. Small left pleural effusion. Underlying pulmonary  vascular congestion. Electronically Signed   By: [name].   On: 01/21/2018 07:04   Dg Chest Port 1 View  Result Date: 01/19/2018 CLINICAL DATA:  Acute respiratory failure. EXAM: PORTABLE CHEST 1 VIEW COMPARISON:  01/18/2018 FINDINGS: Endotracheal tube terminates 4 cm above the carina. Enteric tube can be followed to the GE junction but is not well visualized beyond this due to under penetration in the upper abdomen. Right PICC terminates at the cavoatrial junction. The cardiomediastinal silhouette is unchanged allowing for differences in patient rotation. Interstitial and patchy airspace opacities are again seen bilaterally with mild improvement in the right base and without significant interval change on the left. No large pleural effusion or pneumothorax is identified. IMPRESSION: Bilateral interstitial and airspace opacities, slightly improved on the right and likely reflecting pneumonia. Electronically Signed   By: Sebastian Ache M.D.   On: 01/19/2018 07:49   Dg Chest Port 1 View  Result Date: 01/18/2018 CLINICAL DATA:  Oxygen desaturation EXAM: PORTABLE CHEST 1 VIEW COMPARISON:  01/18/2018 FINDINGS: Endotracheal tube, right PICC line and NG tube remain in place, unchanged. Bilateral interstitial and alveolar opacities are again noted, unchanged. Small bilateral effusions suspected. Heart is upper limits normal in size. IMPRESSION: Bilateral interstitial and airspace opacities with small effusions. No real change. Electronically Signed   By: Charlett Nose M.D.   On: 01/18/2018 16:39   Dg Chest Port 1 View  Result Date: 01/18/2018 CLINICAL DATA:  Acute respiratory failure. EXAM: PORTABLE CHEST  1 VIEW COMPARISON:  01/16/2018 and CT chest 01/06/2018. FINDINGS: Endotracheal tube terminates 4.9 cm above the carina. Nasogastric tube is followed into the stomach. Right PICC tip projects over the SVC RA junction. Heart size within normal limits. Lungs are somewhat low in volume with diffuse  mixed interstitial and airspace opacification, possibly mildly worsened from 01/16/2018. There may be right middle lobe and left lower lobe consolidation. No definite pleural fluid. IMPRESSION: Mixed interstitial and airspace opacification, possibly slightly worsened from 01/16/2018, with possible consolidation in the right middle and left lower lobes. Findings are indicative of persistent pneumonia, especially when compared with 01/06/2018. Electronically Signed   By: Leanna Battles M.D.   On: 01/18/2018 08:12   Dg Chest Port 1 View  Result Date: 01/16/2018 CLINICAL DATA:  Hypoxia and acute respiratory failure. EXAM: PORTABLE CHEST 1 VIEW COMPARISON:  None. FINDINGS: Cardiomediastinal silhouette is unchanged. An endotracheal tube with tip 3.2 cm above the carina, NG tube entering the stomach, RIGHT PICC line with tip overlying the SUPERIOR cavoatrial junction are again noted. Interstitial/airspace opacities are again noted with slight improved aeration on the LEFT. Slightly increased RIGHT UPPER lobe atelectasis noted. There is no evidence of pneumothorax. IMPRESSION: Slightly increased RIGHT UPPER lobe atelectasis and slightly improved LEFT lung aeration. Otherwise no significant change. Electronically Signed   By: Harmon Pier M.D.   On: 01/16/2018 20:49   Dg Chest Port 1 View  Result Date: 01/13/2018 CLINICAL DATA:  Respiratory failure. EXAM: PORTABLE CHEST 1 VIEW COMPARISON:  One-view chest x-ray 01/12/2018. FINDINGS: Heart size is exaggerate by low lung volumes. Endotracheal tube is stable. Left greater than right interstitial and airspace disease is similar the prior exam. Overall lung volumes are slightly improved. IMPRESSION: 1. Stable appearance of left greater than right interstitial and airspace disease, likely a combination of edema and possible infection. 2. Support apparatus is stable. 3. Slight improvement in lung volumes. Electronically Signed   By: Marin Roberts M.D.   On: 01/13/2018  07:49   Dg Chest Port 1 View  Result Date: 01/12/2018 CLINICAL DATA:  Acute respiratory failure with hypoxia. Severe sepsis with septic shock. On ventilator. EXAM: PORTABLE CHEST 1 VIEW COMPARISON:  01/11/2018 FINDINGS: Endotracheal tube and nasogastric tube remain in appropriate position. Heart size remains within normal limits. Low lung volumes again seen with diffuse interstitial infiltrates. No evidence of focal consolidation or significant pleural effusion. IMPRESSION: Stable low lung volumes with diffuse interstitial infiltrates/edema. Electronically Signed   By: Myles Rosenthal M.D.   On: 01/12/2018 07:37   Dg Chest Port 1 View  Result Date: 01/11/2018 CLINICAL DATA:  Respiratory failure EXAM: PORTABLE CHEST 1 VIEW COMPARISON:  January 10, 2018 FINDINGS: The ETT terminates 2.8 cm above the carina. No pneumothorax. The cardiomediastinal silhouette is stable. Pulmonary edema has decreased but persists. No other changes. IMPRESSION: 1. Stable support apparatus. 2. Persistent but decreased pulmonary edema. 3. No other change. Electronically Signed   By: Gerome Sam III M.D   On: 01/11/2018 07:01   Dg Chest Port 1 View  Result Date: 01/10/2018 CLINICAL DATA:  Respiratory failure EXAM: PORTABLE CHEST 1 VIEW COMPARISON:  01/08/2018 FINDINGS: Cardiac shadow is stable. Endotracheal tube and nasogastric catheter are again noted in satisfactory position. Bilateral infiltrates are again identified and stable. No new focal abnormality is seen. No bony abnormality is noted. IMPRESSION: Stable bilateral infiltrates. Electronically Signed   By: Alcide Clever M.D.   On: 01/10/2018 07:24   Dg Chest Port 1 View  Result Date:  01/08/2018 CLINICAL DATA:  Respiratory failure EXAM: PORTABLE CHEST 1 VIEW COMPARISON:  Portable exam 1003 hours compared to 01/07/2018 FINDINGS: Tip of endotracheal tube projects 4.4 cm above carina. Nasogastric tube extends into abdomen. Enlargement of cardiac silhouette. Diffuse BILATERAL  airspace infiltrates increased in upper lobes since previous exam. No gross pleural effusion or pneumothorax. IMPRESSION: Increased BILATERAL airspace infiltrates. Electronically Signed   By: Ulyses Southward M.D.   On: 01/08/2018 10:33   [place] Ekg Site Rite  Result Date: 01/13/2018 If Site Rite image not attached, placement could not be confirmed due to current cardiac rhythm.   Consults: Treatment Team:  Marcina Millard, MD Mick Sell, MD   Subjective:    Overnight patient Did not have any events. Still requiring 100% FiO2 and 10 of PEEP.   Objective:  Vital signs for last 24 hours: Temp:  [98 F (36.7 C)-99.4 F (37.4 C)] 99.4 F (37.4 C) (05/10 0400) Pulse Rate:  [53-80] 53 (05/10 0700) Resp:  [22-36] 24 (05/10 0700) BP: (99-136)/(52-104) 121/54 (05/10 0700) SpO2:  [87 %-99 %] 98 % (05/10 0700) FiO2 (%):  [100 %] 100 % (05/10 0300)  Hemodynamic parameters for last 24 hours:    Intake/Output from previous day: 05/09 0701 - 05/10 0700 In: 3845.8 [I.V.:945.8; NG/GT:1560; IV Piggyback:1300] Out: 3800 [Urine:3800]  Intake/Output this shift: No intake/output data recorded.  Vent settings for last 24 hours: Vent Mode: PRVC FiO2 (%):  [100 %] 100 % Set Rate:  [14 bmp] 14 bmp Vt Set:  [500 mL] 500 mL PEEP:  [10 cmH20] 10 cmH20 Plateau Pressure:  [26 cmH20] 26 cmH20  Physical Exam:  Patient is on mechanical ventilation through tracheostomy. Appears to track and minimally respond Vital signs: Please see the above listed vital signs HEENT: Tracheostomy in place with mechanical ventilation, trachea is midline, mild increased work of breathing, no noted jugular venous distention Cardiovascular: Regular rate and rhythm,  Pulmonary: Coarse expiratory rhonchi noted, inspiratory crackles appreciated bilateral Abdominal: PEG tube noted to be in place, mild distention, bowel sounds appreciated Extremity: Edema noted Neurologic: Patient moves extremities, appears to  track  Assessment/Plan:   Patient 57 year old gentleman with traumatic brain injury with acute respiratory failure, severe bilateral pneumonia aspiration, sepsis. Still requiring high PEEP and FiO2. Pending morning CXR. Continue to diurese if blood pressure is not prohibitive. Presently being followed by infectious disease, on meropenem, vancomycin. Positive blood cultures for Enterobacter and Klebsiella.Chest x-ray performed yesterday revealed diffuse bilateral airspace disease pulmonary edema versus ARDS. We'll continue to try to diurese however physiology may be more consistent with ARDS and volume  History of atrial fibrillation. On amiodarone And systemic anticoagulation  History of dilated cardiomyopathy with ejection fraction 20%  Anemia. No clear evidence of active bleeding. Hemoglobin is 7.2  Leukocytosis. White count is 15.5    Critical Care Total Time: 40 minutes  Tommy Mathews 02/07/2018  *Care during the described time interval was provided by me and/or other providers on the critical care team.  I have reviewed this patient's available data, including medical history, events of note, physical examination and test results as part of my evaluation. Patient ID: Tommy Mathews, male   DOB: 10-30-1960, 57 y.o.   MRN: [id number] Patient ID: Tommy Mathews, male   DOB: 04-06-1961, 57 y.o.   MRN: [id number] Patient ID: Tommy Mathews, male   DOB: [birth year], 57 y.o.   MRN: [id number] Patient ID: Tommy Mathews, male   DOB: 05-09-1961, 57 y.o.   MRN: [id number]

## 2018-02-07 NOTE — Progress Notes (Signed)
PT Cancellation Note  Patient Details Name: Tommy Mathews MRN: [id number] DOB: [birth year]   Cancelled Treatment:    Reason Eval/Treat Not Completed: Medical issues which prohibited therapy.  Per discussion with nursing, pt not appropriate for therapy at this time d/t respiratory status concerns.  Pt currently requiring 100% FiO2 and 10 of PEEP.  Also on Precedex drip.  Will hold therapy over the weekend and re-assess pt's status on Monday for appropriateness to participate in PT--nursing notified (will need re-eval if appropriate for therapy participation).  Hendricks Limes, PT 02/07/18, 9:01 AM [phone]

## 2018-02-07 NOTE — Progress Notes (Signed)
Patient ID: Tommy Mathews, male   DOB: [birth year], 57 y.o.   MRN: [id number]  [facility] PROGRESS NOTE  Tommy Mathews MOQ:[id number] DOB: 09-07-1961 DOA: 01/06/2018 PCP: Tommy Cowman, MD  HPI/Subjective: Patient continues to be on ventilator no significant change  Objective: Vitals:   02/07/18 1500 02/07/18 1503  BP: (!) 112/46   Pulse: 62 (!) 59  Resp: (!) 27   Temp: 99.2 F (37.3 C)   SpO2: 92%     Filed Weights   01/19/18 0347 01/20/18 1200 01/26/18 0418  Weight: (!) 136.7 kg (301 lb 5.9 oz) (!) 139.5 kg (307 lb 8.7 oz) 133.2 kg (293 lb 10.4 oz)    ROS: Review of Systems  Unable to perform ROS: Acuity of condition   Exam: Physical Exam  Constitutional:  Intubated  HENT:  Nose: No mucosal edema.  Eyes: Pupils are equal, round, and reactive to light. Conjunctivae and lids are normal.  Neck: Carotid bruit is not present. No thyroid mass present.  Cardiovascular: Regular rhythm, S1 normal, S2 normal and normal heart sounds.  Respiratory: He has decreased breath sounds. He has no wheezes. He has rhonchi. He has no rales.  GI: Soft. Bowel sounds are normal. There is tenderness.  Musculoskeletal:       Right ankle: He exhibits swelling.       Left ankle: He exhibits swelling.  Skin: Skin is warm. No rash noted. Nails show no clubbing.  Psychiatric:  Patient currently on Precedex      Data Reviewed: Basic Metabolic Panel: Recent Labs  Lab 02/01/18 0411  02/02/18 0433 02/03/18 0441 02/04/18 0835 02/05/18 0938 02/06/18 1702 02/07/18 0455  NA 139  --  139 139 137 137 139 138  K 3.1*   < > 3.6 4.1 3.9 3.6 3.9 4.2  CL 92*  --  98* 97* 97* 96* 98* 96*  CO2 42*  --  37* 38* 36* 35* 36* 37*  GLUCOSE 86  --  125* 185* 174* 182* 165* 154*  BUN 22*  --  '17 15 14 19 18 20  ' CREATININE 0.81  --  0.62 0.71 0.68 0.62 0.55* 0.61  CALCIUM 7.8*  --  7.5* 7.9* 8.1* 7.9* 7.8* 8.0*  MG 2.3  --  1.8  --   --   --   --   --   PHOS 4.2  --  3.9  --   --   --   --    --    < > = values in this interval not displayed.   Liver Function Tests: Recent Labs  Lab 02/01/18 0411 02/07/18 0455  AST 34  --   ALT 23  --   ALKPHOS 81  --   BILITOT 0.8  --   PROT 5.6*  --   ALBUMIN 1.8* 1.6*   CBC: Recent Labs  Lab 02/01/18 0411 02/02/18 0433 02/03/18 0441 02/04/18 0835 02/06/18 0430 02/07/18 0455  WBC 17.5* 13.1* 12.1* 17.4* 15.5* 14.8*  NEUTROABS 13.7* 9.5* 9.2* 12.7*  --  11.8*  HGB 7.5* 7.3* 7.6* 8.0* 7.2* 7.2*  HCT 23.0* 22.2* 23.4* 25.2* 22.8* 22.5*  MCV 84.7 84.8 85.0 85.9 85.6 85.0  PLT 236 273 370 452* 467* 480*   BNP (last 3 results) Recent Labs    01/06/18 1944 01/16/18 2332  BNP 37.0 402.0*     CBG: Recent Labs  Lab 02/06/18 1943 02/07/18 0014 02/07/18 0431 02/07/18 0754 02/07/18 1151  GLUCAP 155* 128* 150* 130* 133*  Studies: Dg Chest Port 1 View  Result Date: 02/06/2018 CLINICAL DATA:  Intubated patient with sepsis, pneumonia and possible aspiration. EXAM: PORTABLE CHEST 1 VIEW COMPARISON:  Single-view of the chest 02/05/2018 and 02/04/2018. FINDINGS: Tracheostomy tube and right PICC remain in place. Diffuse bilateral airspace disease is unchanged. No pneumothorax or pleural effusion. IMPRESSION: No change in diffuse bilateral airspace disease. Electronically Signed   By: Inge Rise M.D.   On: 02/06/2018 11:04    Scheduled Meds: . amiodarone  400 mg Per Tube BID  . atorvastatin  80 mg Per Tube q1800  . budesonide (PULMICORT) nebulizer solution  0.5 mg Nebulization BID  . chlorhexidine gluconate (MEDLINE KIT)  15 mL Mouth Rinse BID  . clonazePAM  1 mg Per Tube BID  . digoxin  0.25 mg Per Tube Daily  . enoxaparin (LOVENOX) injection  40 mg Subcutaneous Q24H  . fentaNYL  100 mcg Transdermal Q72H  . FLUoxetine  40 mg Per Tube Daily  . free water  200 mL Per Tube Q8H  . furosemide  40 mg Intravenous Daily  . gabapentin  600 mg Per Tube Q8H  . insulin aspart  0-20 Units Subcutaneous Q4H  . insulin  glargine  20 Units Subcutaneous QHS  . ipratropium-albuterol  3 mL Nebulization Q6H  . lisinopril  5 mg Per Tube Daily  . mouth rinse  15 mL Mouth Rinse 10 times per day  . metoCLOPramide  10 mg Oral Q12H  . metolazone  10 mg Oral QPC breakfast  . metoprolol tartrate  25 mg Per Tube Q6H  . nutrition supplement (JUVEN)  1 packet Per Tube BID  . nystatin  5 mL Oral QID  . pantoprazole sodium  40 mg Per Tube Daily  . Valproate Sodium  1,000 mg Per Tube BID   Continuous Infusions: . sodium chloride    . dexmedetomidine (PRECEDEX) IV infusion 1.2 mcg/kg/hr (02/07/18 1309)  . feeding supplement (GLUCERNA 1.5 CAL) 1,000 mL (02/07/18 1510)  . meropenem (MERREM) IV 1 g (02/07/18 1502)  . vancomycin Stopped (02/07/18 1152)    Assessment/Plan:  1. Ventilator associated pneumonia continue current antibiotics with vancomycin meropenem 2. acute hypoxic respiratory failure.   Continue ventilator slow to wean off 3. Atrial fibrillation with rapid ventricular response, episodes of SVT.  Patient on amiodarone, digoxin and metoprolol.  Heart rates been stable 4. Acute on chronic systolic congestive heart failure with anasarca and scrotal edema.  On IV Lasix daily 5. Acute encephalopathy.   history of traumatic brain injury. 6. Previous septic shock and pneumonia treated already. 7. Gastrointestinal bleed. on PPI.  Endoscopy negative.   Xarelto stopped continue to monitor hemoglobin 8. Acute kidney injury resolved. 9. Type 2 diabetes mellitus on glargine insulin and sliding scale 10. Nutrition on tube feeding per intensivist 11. Depression continue Depakene. 12. Thrush on nystatin status post treatment 13. Hyperlipidemia unspecified on atorvastatin  Overall prognosis poor patient has not shown much improvement  Code Status:     Code Status Orders  (From admission, onward)        Start     Ordered   01/06/18 2227  Full code  Continuous     01/06/18 2226    Code Status History    This  patient has a current code status but no historical code status.     Family Communication:   mother and patient's sister at the bedside Disposition Plan:  will need long-term placement on a facility that can handle a ventilator  Consultants:  Critical care specialist  Cardiology  Time spent: 28 minutes  Tommy Mathews [facility]

## 2018-02-08 ENCOUNTER — Inpatient Hospital Stay: Payer: Medicaid Other

## 2018-02-08 DIAGNOSIS — A419 Sepsis, unspecified organism: Secondary | ICD-10-CM

## 2018-02-08 DIAGNOSIS — R6521 Severe sepsis with septic shock: Secondary | ICD-10-CM

## 2018-02-08 LAB — URINALYSIS, COMPLETE (UACMP) WITH MICROSCOPIC
BILIRUBIN URINE: NEGATIVE
Bacteria, UA: NONE SEEN
Glucose, UA: NEGATIVE mg/dL
HGB URINE DIPSTICK: NEGATIVE
KETONES UR: NEGATIVE mg/dL
LEUKOCYTES UA: NEGATIVE
Nitrite: NEGATIVE
PROTEIN: 30 mg/dL — AB
Specific Gravity, Urine: 1.013 (ref 1.005–1.030)
Squamous Epithelial / LPF: NONE SEEN (ref 0–5)
pH: 7 (ref 5.0–8.0)

## 2018-02-08 LAB — CBC
HCT: 21.3 % — ABNORMAL LOW (ref 40.0–52.0)
HEMATOCRIT: 22.2 % — AB (ref 40.0–52.0)
HEMOGLOBIN: 6.9 g/dL — AB (ref 13.0–18.0)
HEMOGLOBIN: 7.2 g/dL — AB (ref 13.0–18.0)
MCH: 27.5 pg (ref 26.0–34.0)
MCH: 27.4 pg (ref 26.0–34.0)
MCHC: 32.3 g/dL (ref 32.0–36.0)
MCHC: 32.2 g/dL (ref 32.0–36.0)
MCV: 85.3 fL (ref 80.0–100.0)
MCV: 85.1 fL (ref 80.0–100.0)
Platelets: 443 10*3/uL — ABNORMAL HIGH (ref 150–440)
Platelets: 389 10*3/uL (ref 150–440)
RBC: 2.5 MIL/uL — AB (ref 4.40–5.90)
RBC: 2.61 MIL/uL — AB (ref 4.40–5.90)
RDW: 18 % — ABNORMAL HIGH (ref 11.5–14.5)
RDW: 18.7 % — ABNORMAL HIGH (ref 11.5–14.5)
WBC: 12.7 10*3/uL — ABNORMAL HIGH (ref 3.8–10.6)
WBC: 12.3 10*3/uL — AB (ref 3.8–10.6)

## 2018-02-08 LAB — BASIC METABOLIC PANEL
Anion gap: 5 (ref 5–15)
BUN: 22 mg/dL — AB (ref 6–20)
CALCIUM: 8.2 mg/dL — AB (ref 8.9–10.3)
CO2: 39 mmol/L — AB (ref 22–32)
CREATININE: 0.67 mg/dL (ref 0.61–1.24)
Chloride: 94 mmol/L — ABNORMAL LOW (ref 101–111)
GFR calc Af Amer: >60 mL/min (ref >60)
GFR calc non Af Amer: >60 mL/min (ref >60)
GLUCOSE: 130 mg/dL — AB (ref 65–99)
Potassium: 4.3 mmol/L (ref 3.5–5.1)
Sodium: 138 mmol/L (ref 135–145)

## 2018-02-08 LAB — PREPARE RBC (CROSSMATCH)

## 2018-02-08 LAB — GLUCOSE, CAPILLARY
GLUCOSE-CAPILLARY: 130 mg/dL — AB (ref 65–99)
GLUCOSE-CAPILLARY: 125 mg/dL — AB (ref 65–99)
GLUCOSE-CAPILLARY: 196 mg/dL — AB (ref 65–99)
Glucose-Capillary: 122 mg/dL — ABNORMAL HIGH (ref 65–99)
Glucose-Capillary: 97 mg/dL (ref 65–99)
Glucose-Capillary: 170 mg/dL — ABNORMAL HIGH (ref 65–99)
Glucose-Capillary: 140 mg/dL — ABNORMAL HIGH (ref 65–99)

## 2018-02-08 LAB — MAGNESIUM: Magnesium: 1.7 mg/dL (ref 1.7–2.4)

## 2018-02-08 LAB — PHOSPHORUS: Phosphorus: 4 mg/dL (ref 2.5–4.6)

## 2018-02-08 MED ORDER — MAGNESIUM SULFATE 2 GM/50ML IV SOLN
2.0000 g | Freq: Once | INTRAVENOUS | Status: AC
  Administered 2018-02-08: 2 g via INTRAVENOUS
  Filled 2018-02-08: qty 50

## 2018-02-08 MED ORDER — SODIUM CHLORIDE 0.9 % IV SOLN
Freq: Once | INTRAVENOUS | Status: AC
  Administered 2018-02-08: 17:00:00 via INTRAVENOUS

## 2018-02-08 MED ORDER — CEFAZOLIN SODIUM-DEXTROSE 2-4 GM/100ML-% IV SOLN
2.0000 g | Freq: Three times a day (TID) | INTRAVENOUS | Status: AC
  Administered 2018-02-08 – 2018-02-10 (×6): 2 g via INTRAVENOUS
  Filled 2018-02-08 (×6): qty 100

## 2018-02-08 NOTE — Plan of Care (Signed)
Patient remains on ventilator at this time.  Agitation noted this shift. Precedex running at 1.0 mcg/hr.  Tolerating well.  Patient becomes agitated with movement and patient care.  Tolerating vent at this time. Some coughing noted.  Slight elevation in temp this shift.  Will continue to monitor.

## 2018-02-08 NOTE — Progress Notes (Addendum)
Pharmacy Antibiotic Note  Tommy Mathews is a 57 y.o. male being treated for possible ventilator assiociated pneumonia.  Pharmacy has been consulted for meropenem and vancomycin dosing. Patient previously on anidulafungin; last dose 5/6. Patient currently requiring mechanical ventilation and has a PEG. PICC line changed on 5/3.   Patient previously treated for aspiration pneumonia this admission.   Plan: Course of Vancomycin and Meropenem completed.   Per ID will start Cefazolin with a stop date 5/13.  Will start Cefazolin 2g IV q8h    Height:  (177.8 cm) Weight: 293 lb 10.4 oz (133.2 kg) IBW/kg (Calculated) : 73  Adjusted Body Weight: 98kg   Temp (24hrs), Avg:99.5 F (37.5 C), Min:99.2 F (37.3 C), Max:100 F (37.8 C)  Recent Labs  Lab 02/03/18 0441 02/03/18 1848 02/04/18 0835 02/05/18 0938 02/06/18 0430 02/06/18 1702 02/07/18 0455 02/08/18 0409  WBC 12.1*  --  17.4*  --  15.5*  --  14.8* 12.7*  CREATININE 0.71  --  0.68 0.62  --  0.55* 0.61 0.67  VANCOTROUGH 28*  --   --  18  --   --   --   --   VANCORANDOM  --  12  --   --   --   --   --   --     Estimated Creatinine Clearance: 143.3 mL/min (by C-G formula based on SCr of 0.67 mg/dL).    No Known Allergies  Antimicrobials this admission: Vancomycin 5/3 >> 5/10 Meropenem 5/3 >> 5/10 Anidulafungin 5/3 >> 5/6 Cefazolin 5/11 >> 5/13    Microbiology results: 5/3 BCx: Klebsiella Pneumoniae 5/7 BCx: no growth x 3 days 5/3 UCx: NG  5/3 Tracheal Aspirate: mod GPC, GPR  4/20 MRSA PCR: positive  Thank you for allowing pharmacy to be a part of this patient's care.  Cleopatra Cedar, PharmD Pharmacy Resident  02/08/2018 9:56 AM

## 2018-02-08 NOTE — Progress Notes (Signed)
Patient ID: Tommy Mathews, male   DOB: May 01, 1961, 57 y.o.   MRN: [id number]  [employer] [facility] PROGRESS NOTE  Curran Lenderman YKZ:[id number] DOB: [birth year] DOA: 01/06/2018 PCP: Isaias Cowman, MD  HPI/Subjective: Patient follows a few simple commands  Objective: Vitals:   02/08/18 1300 02/08/18 1400  BP: 110/60 (!) 101/50  Pulse: 70 70  Resp: (!) 24 (!) 21  Temp:    SpO2: (!) 89% 94%    Filed Weights   01/19/18 0347 01/20/18 1200 01/26/18 0418  Weight: (!) 136.7 kg (301 lb 5.9 oz) (!) 139.5 kg (307 lb 8.7 oz) 133.2 kg (293 lb 10.4 oz)    ROS: Review of Systems  Unable to perform ROS: Acuity of condition   Exam: Physical Exam  Constitutional: He appears lethargic.  HENT:  Nose: No mucosal edema.  Mouth/Throat: No oropharyngeal exudate or posterior oropharyngeal edema.  Eyes: Pupils are equal, round, and reactive to light. Conjunctivae, EOM and lids are normal.  Neck: No JVD present. Carotid bruit is not present. No edema present. No thyroid mass and no thyromegaly present.  Cardiovascular: S1 normal and S2 normal. Exam reveals no gallop.  No murmur heard. Pulses:      Dorsalis pedis pulses are 2+ on the right side, and 2+ on the left side.  Respiratory: No respiratory distress. He has decreased breath sounds in the right upper field, the right middle field, the right lower field, the left upper field, the left middle field and the left lower field. He has no wheezes. He has rhonchi in the right upper field, the right middle field, the right lower field, the left upper field, the left middle field and the left lower field. He has no rales.  GI: Soft. Bowel sounds are normal. He exhibits distension. There is no tenderness.  Musculoskeletal:       Right ankle: He exhibits swelling.       Left ankle: He exhibits swelling.  Lymphadenopathy:    He has no cervical adenopathy.  Neurological: He appears lethargic.  Able to wiggle toes and squeeze with his hands.  Skin: Skin is  warm. Nails show no clubbing.  As per nursing staff stage II decubitus on buttock  Psychiatric:  Lethargic      Data Reviewed: Basic Metabolic Panel: Recent Labs  Lab 02/02/18 0433  02/04/18 0835 02/05/18 0938 02/06/18 1702 02/07/18 0455 02/08/18 0409  NA 139   < > 137 137 139 138 138  K 3.6   < > 3.9 3.6 3.9 4.2 4.3  CL 98*   < > 97* 96* 98* 96* 94*  CO2 37*   < > 36* 35* 36* 37* 39*  GLUCOSE 125*   < > 174* 182* 165* 154* 130*  BUN 17   < > _0 22*  CREATININE 0.62   < > 0.68 0.62 0.55* 0.61 0.67  CALCIUM 7.5*   < > 8.1* 7.9* 7.8* 8.0* 8.2*  MG 1.8  --   --   --   --   --  1.7  PHOS 3.9  --   --   --   --   --  4.0   < > = values in this interval not displayed.   Liver Function Tests: Recent Labs  Lab 02/07/18 0455  ALBUMIN 1.6*   No results for input(s): LIPASE, AMYLASE in the last 168 hours. No results for input(s): AMMONIA in the last 168 hours. CBC: Recent Labs  Lab 02/02/18 0433 02/03/18 0441 02/04/18  1610 02/06/18 0430 02/07/18 0455 02/08/18 0409  WBC 13.1* 12.1* 17.4* 15.5* 14.8* 12.7*  NEUTROABS 9.5* 9.2* 12.7*  --  11.8*  --   HGB 7.3* 7.6* 8.0* 7.2* 7.2* 6.9*  HCT 22.2* 23.4* 25.2* 22.8* 22.5* 21.3*  MCV 84.8 85.0 85.9 85.6 85.0 85.3  PLT 273 370 452* 467* 480* 443*   Cardiac Enzymes: No results for input(s): CKTOTAL, CKMB, CKMBINDEX, TROPONINI in the last 168 hours. BNP (last 3 results) Recent Labs    01/06/18 1944 01/16/18 2332  BNP 37.0 402.0*    ProBNP (last 3 results) No results for input(s): PROBNP in the last 8760 hours.  CBG: Recent Labs  Lab 02/07/18 1929 02/07/18 2359 02/08/18 0322 02/08/18 0745 02/08/18 1142  GLUCAP 175* 122* 130* 97 170*    Recent Results (from the past 240 hour(s))  CULTURE, BLOOD (ROUTINE X 2) w Reflex to ID Panel     Status: Abnormal   Collection Time: 01/31/18  8:17 AM  Result Value Ref Range Status   Specimen Description   Final    BLOOD LEFT HAND Performed at [facility], [place] [address] [place], [place] [address]    Special Requests   Final    BOTTLES DRAWN AEROBIC AND ANAEROBIC Blood Culture results may not be optimal due to an excessive volume of blood received in culture bottles Performed at [facility], [address] [place], [place] [address]    Culture  Setup Time   Final    GRAM NEGATIVE RODS AEROBIC BOTTLE ONLY CRITICAL RESULT CALLED TO, READ BACK BY AND VERIFIED WITH: DAVID BESANTI ON 02/05/18 AT 0228 JAG Performed at [facility], [place] [address], [place], [place] [address]    Culture KLEBSIELLA PNEUMONIAE (A)  Final   Report Status 02/07/2018 FINAL  Final   Organism ID, Bacteria KLEBSIELLA PNEUMONIAE  Final      Susceptibility   Klebsiella pneumoniae - MIC*    AMPICILLIN >=32 RESISTANT Resistant     CEFAZOLIN <=4 SENSITIVE Sensitive     CEFEPIME <=1 SENSITIVE Sensitive     CEFTAZIDIME <=1 SENSITIVE Sensitive     CEFTRIAXONE <=1 SENSITIVE Sensitive     CIPROFLOXACIN <=0.25 SENSITIVE Sensitive     GENTAMICIN <=1 SENSITIVE Sensitive     IMIPENEM <=0.25 SENSITIVE Sensitive     TRIMETH/SULFA <=20 SENSITIVE Sensitive     AMPICILLIN/SULBACTAM >=32 RESISTANT Resistant     PIP/TAZO 16 SENSITIVE Sensitive     Extended ESBL NEGATIVE Sensitive     * KLEBSIELLA PNEUMONIAE  Blood Culture ID Panel (Reflexed)     Status: Abnormal   Collection Time: 01/31/18  8:17 AM  Result Value Ref Range Status   Enterococcus species NOT DETECTED NOT DETECTED Final   Listeria monocytogenes NOT DETECTED NOT DETECTED Final   Staphylococcus species NOT DETECTED NOT DETECTED Final   Staphylococcus aureus NOT DETECTED NOT DETECTED Final   Streptococcus species NOT DETECTED NOT DETECTED Final   Streptococcus agalactiae NOT DETECTED NOT DETECTED Final   Streptococcus pneumoniae NOT DETECTED NOT DETECTED Final   Streptococcus pyogenes NOT DETECTED NOT DETECTED Final   Acinetobacter baumannii NOT DETECTED NOT DETECTED Final   Enterobacteriaceae species  DETECTED (A) NOT DETECTED Final    Comment: Enterobacteriaceae represent a large family of gram-negative bacteria, not a single organism. CRITICAL RESULT CALLED TO, READ BACK BY AND VERIFIED WITH: DAVID BESANTI ON 02/05/18 AT 0228 JAG    Enterobacter cloacae complex NOT DETECTED NOT DETECTED Final   Escherichia  coli NOT DETECTED NOT DETECTED Final   Klebsiella oxytoca NOT DETECTED NOT DETECTED Final   Klebsiella pneumoniae DETECTED (A) NOT DETECTED Final    Comment: CRITICAL RESULT CALLED TO, READ BACK BY AND VERIFIED WITH: DAVID BESANTI ON 02/05/18 AT 0228 JAG    Proteus species NOT DETECTED NOT DETECTED Final   Serratia marcescens NOT DETECTED NOT DETECTED Final   Carbapenem resistance NOT DETECTED NOT DETECTED Final   Haemophilus influenzae NOT DETECTED NOT DETECTED Final   Neisseria meningitidis NOT DETECTED NOT DETECTED Final   Pseudomonas aeruginosa NOT DETECTED NOT DETECTED Final   Candida albicans NOT DETECTED NOT DETECTED Final   Candida glabrata NOT DETECTED NOT DETECTED Final   Candida krusei NOT DETECTED NOT DETECTED Final   Candida parapsilosis NOT DETECTED NOT DETECTED Final   Candida tropicalis NOT DETECTED NOT DETECTED Final    Comment: Performed at [facility], [address] [place], [place] [address]  Urine Culture     Status: None   Collection Time: 01/31/18  8:30 AM  Result Value Ref Range Status   Specimen Description   Final    URINE, RANDOM Performed at [facility], [address] [place], [place] [address]    Special Requests   Final    NONE Performed at [facility], [address], [place], [place] [address]    Culture   Final    NO GROWTH Performed at [facility], [place] [address], [place], [place] [address]    Report Status 02/01/2018 FINAL  Final  CULTURE, BLOOD (ROUTINE X 2) w Reflex to ID Panel     Status: Abnormal   Collection Time: 01/31/18  8:53 AM  Result Value Ref Range Status   Specimen Description    Final    BLOOD BLOOD RIGHT HAND Performed at [facility], [address] [place], [place] [address]    Special Requests   Final    BOTTLES DRAWN AEROBIC AND ANAEROBIC Blood Culture adequate volume Performed at [facility], [address] [place], [place] [address]    Culture  Setup Time   Final    GRAM NEGATIVE RODS AEROBIC BOTTLE ONLY CRITICAL RESULT CALLED TO, READ BACK BY AND VERIFIED WITH: DAVID BESANTI AT 1660 ON 02/05/18 [place]. Performed at [facility], [place] [place], [place] [address]    Culture (A)  Final    KLEBSIELLA PNEUMONIAE SUSCEPTIBILITIES PERFORMED ON PREVIOUS CULTURE WITHIN THE LAST 5 DAYS. Performed at [facility], [place] [address] [place], [place] [address]    Report Status 02/07/2018 FINAL  Final  Culture, respiratory (NON-Expectorated)     Status: None   Collection Time: 01/31/18 10:47 AM  Result Value Ref Range Status   Specimen Description   Final    TRACHEAL ASPIRATE Performed at [facility], [address] [place], [place] [address]    Special Requests   Final    NONE Performed at [facility], [address] [place], [place] [address]    Gram Stain   Final    MODERATE WBC PRESENT, PREDOMINANTLY PMN MODERATE GRAM POSITIVE COCCI MODERATE GRAM POSITIVE RODS    Culture   Final    Consistent with normal respiratory flora. Performed at [facility], [place] [address] [place], [place] [address]    Report Status 02/02/2018 FINAL  Final  CULTURE, BLOOD (ROUTINE X 2) w Reflex to ID Panel     Status: None (Preliminary result)  Collection Time: 02/04/18  5:41 PM  Result Value Ref Range Status   Specimen Description BLOOD BLOOD LEFT HAND  Final   Special Requests   Final    BOTTLES DRAWN AEROBIC AND ANAEROBIC Blood Culture adequate volume   Culture   Final    NO GROWTH 4 DAYS Performed at [facility], [address], [place], [place] [address]    Report Status  PENDING  Incomplete  CULTURE, BLOOD (ROUTINE X 2) w Reflex to ID Panel     Status: None (Preliminary result)   Collection Time: 02/04/18  8:50 PM  Result Value Ref Range Status   Specimen Description BLOOD LEFT HAND  Final   Special Requests   Final    BOTTLES DRAWN AEROBIC AND ANAEROBIC Blood Culture adequate volume   Culture   Final    NO GROWTH 3 DAYS Performed at [facility], [address] [place], [place] [address]    Report Status PENDING  Incomplete  MRSA PCR Screening     Status: None   Collection Time: 02/05/18  9:38 AM  Result Value Ref Range Status   MRSA by PCR NEGATIVE NEGATIVE Final    Comment:        The GeneXpert MRSA Assay (FDA approved for NASAL specimens only), is one component of a comprehensive MRSA colonization surveillance program. It is not intended to diagnose MRSA infection nor to guide or monitor treatment for MRSA infections. Performed at [facility], [address], [place], [place] [address]      Studies: Dg Chest [facility] 1 View  Result Date: 02/08/2018 CLINICAL DATA:  Acute respiratory failure EXAM: PORTABLE CHEST 1 VIEW COMPARISON:  Feb 06, 2018. FINDINGS: The heart size and mediastinal contours are stable. The heart size is enlarged. Tracheostomy tube is identified in good position. Right central venous line is identified with distal tip in the superior vena cava. Diffuse opacity is identified throughout bilateral lungs. There are probable bilateral pleural effusions. The visualized skeletal structures are stable. IMPRESSION: Diffuse airspace disease identified in both lungs unchanged compared to prior exam. Probable small bilateral pleural effusions. Electronically Signed   By: Abelardo Diesel M.D.   On: 02/08/2018 07:14    Scheduled Meds: . amiodarone  400 mg Per Tube BID  . atorvastatin  80 mg Per Tube q1800  . budesonide (PULMICORT) nebulizer solution  0.5 mg Nebulization BID  . chlorhexidine gluconate (MEDLINE KIT)  15 mL  Mouth Rinse BID  . clonazePAM  1 mg Per Tube BID  . digoxin  0.25 mg Per Tube Daily  . enoxaparin (LOVENOX) injection  40 mg Subcutaneous Q24H  . fentaNYL  100 mcg Transdermal Q72H  . FLUoxetine  40 mg Per Tube Daily  . free water  200 mL Per Tube Q8H  . furosemide  40 mg Intravenous Daily  . gabapentin  600 mg Per Tube Q8H  . insulin aspart  0-20 Units Subcutaneous Q4H  . insulin glargine  20 Units Subcutaneous QHS  . ipratropium-albuterol  3 mL Nebulization Q6H  . lisinopril  5 mg Per Tube Daily  . mouth rinse  15 mL Mouth Rinse 10 times per day  . metoCLOPramide  10 mg Oral Q12H  . metolazone  10 mg Oral QPC breakfast  . metoprolol tartrate  25 mg Per Tube Q6H  . nutrition supplement (JUVEN)  1 packet Per Tube BID  . nystatin  5 mL Oral QID  . pantoprazole sodium  40 mg Per Tube Daily  .  Valproate Sodium  1,000 mg Per Tube BID   Continuous Infusions: . sodium chloride    . sodium chloride    .  ceFAZolin (ANCEF) IV Stopped (02/08/18 1232)  . dexmedetomidine (PRECEDEX) IV infusion 1 mcg/kg/hr (02/08/18 1400)  . feeding supplement (GLUCERNA 1.5 CAL) 65 mL/hr at 02/08/18 1400    Assessment/Plan:  1. Ventilator associated pneumonia.  Sepsis with Klebsiella and blood cultures.  As per infectious disease can stop Vanco and continue antibiotics through 5/13. 2. Acute hypoxic respiratory failure.  Case discussed with critical care specialist and this is concerning for ARDS.  Patient still on 90% FiO2 3. Atrial fibrillation with rapid ventricular response and episodes of SVT.  Patient on amiodarone, digoxin and metoprolol 4. Acute on chronic systolic congestive heart failure with anasarca and scrotal edema on IV Lasix and metolazone 5. Acute encephalopathy with history of traumatic brain injury 6. Recent GI bleed on PPI.  Endoscopy negative Xarelto stopped.  Last hemoglobin 6.9 7. Acute kidney injury.  Has improved 8. Type 2 diabetes mellitus on glargine insulin and sliding  scale 9. Nutrition.  On tube feeds as per intensivist 10. Depression on Depakene 11. Hyperlipidemia unspecified on atorvastatin  Code Status:     Code Status Orders  (From admission, onward)        Start     Ordered   01/06/18 2227  Full code  Continuous     01/06/18 2226    Code Status History    This patient has a current code status but no historical code status.     Family Communication: As per critical care specialist Disposition Plan: To be determined  Consultants:  Critical care specialist  Infectious disease  Antibiotics:  Ancef  Time spent: 28 minutes  [place]

## 2018-02-08 NOTE — Progress Notes (Signed)
Follow up - Critical Care Medicine Note  Patient Details:    Tommy Mathews is an 57 y.o. male.with a past medical history remarkable for diabetes, gastroesophageal reflux disease, hyperlipidemia, hypertension, traumatic brain injury, status post tracheostomy and PEG tube placement, is in the intensive care unit for sepsis, severe pneumonia, suspected aspiration  Lines, Airways, Drains: PICC Triple Lumen 01/31/18 PICC Right 46 cm 0 cm (Active)  Indication for Insertion or Continuance of Line Administration of hyperosmolar/irritating solutions (i.e. TPN, Vancomycin, etc.) 02/03/2018  8:00 AM  Exposed Catheter (cm) 0 cm 01/31/2018 12:00 PM  Site Assessment Clean;Dry;Intact 02/02/2018  8:50 PM  Lumen #1 Status Infusing 02/02/2018  8:50 PM  Lumen #2 Status Infusing 02/02/2018  8:50 PM  Lumen #3 Status Saline locked 02/02/2018  8:50 PM  Dressing Type Transparent 02/02/2018  8:50 PM  Dressing Status Clean;Dry;Intact 02/02/2018  8:50 PM  Line Care Connections checked and tightened 02/02/2018  8:50 PM  Line Adjustment (NICU/IV Team Only) No 02/01/2018 12:39 PM  Dressing Change Due 02/07/18 02/02/2018 11:06 AM     Gastrostomy/Enterostomy PEG-jejunostomy (Active)  Surrounding Skin Dry;Intact 02/03/2018  3:42 AM  Tube Status Patent 02/03/2018  3:42 AM  Drainage Appearance None 02/03/2018  3:42 AM  Dressing Status Clean;Dry;Intact 02/03/2018  3:42 AM  Dressing Intervention New dressing 01/31/2018  6:30 PM  Dressing Type Split gauze 02/03/2018  3:42 AM  Dressing Change Due 02/06/18 02/02/2018 11:30 PM  G Port Intake (mL) 80 ml 01/30/2018  5:30 PM     Urethral Catheter (Active)  Indication for Insertion or Continuance of Catheter Bladder outlet obstruction / other urologic reason 02/03/2018  3:42 AM  Site Assessment Intact;Clean 02/03/2018  3:42 AM  Catheter Maintenance Bag below level of bladder;No dependent loops;Seal intact;Bag emptied prior to transport 02/03/2018  3:42 AM  Collection Container Standard drainage bag 02/03/2018  3:42 AM   Securement Method Leg strap 02/03/2018  3:42 AM  Urinary Catheter Interventions Unclamped 02/03/2018  3:42 AM  Input (mL) 0 mL 01/15/2018 11:47 PM  Output (mL) 675 mL 02/03/2018  6:00 AM    Anti-infectives:  Anti-infectives (From admission, onward)   Start     Dose/Rate Route Frequency Ordered Stop   02/03/18 2100  vancomycin (VANCOCIN) 1,500 mg in sodium chloride 0.9 % 500 mL IVPB     1,500 mg 250 mL/hr over 120 Minutes Intravenous Every 12 hours 02/03/18 1950 02/07/18 2359   02/03/18 0522  vancomycin (VANCOCIN) 1,500 mg in sodium chloride 0.9 % 500 mL IVPB  Status:  Discontinued     1,500 mg 250 mL/hr over 120 Minutes Intravenous As needed 02/03/18 0523 02/03/18 1950   02/01/18 2100  vancomycin (VANCOCIN) 1,500 mg in sodium chloride 0.9 % 500 mL IVPB  Status:  Discontinued     1,500 mg 250 mL/hr over 120 Minutes Intravenous Every 8 hours 02/01/18 2038 02/03/18 0523   02/01/18 1800  anidulafungin (ERAXIS) 100 mg in sodium chloride 0.9 % 100 mL IVPB  Status:  Discontinued     100 mg 78 mL/hr over 100 Minutes Intravenous Every 24 hours 01/31/18 1734 02/04/18 0911   02/01/18 1500  anidulafungin (ERAXIS) 100 mg in sodium chloride 0.9 % 100 mL IVPB  Status:  Discontinued     100 mg 78 mL/hr over 100 Minutes Intravenous Every 24 hours 01/31/18 1540 01/31/18 1734   01/31/18 2000  vancomycin (VANCOCIN) 1,250 mg in sodium chloride 0.9 % 250 mL IVPB  Status:  Discontinued     1,250 mg 166.7 mL/hr over  90 Minutes Intravenous Every 8 hours 01/31/18 1734 02/01/18 2038   01/31/18 1900  vancomycin (VANCOCIN) 1,250 mg in sodium chloride 0.9 % 250 mL IVPB  Status:  Discontinued     1,250 mg 166.7 mL/hr over 90 Minutes Intravenous Every 8 hours 01/31/18 1728 01/31/18 1734   01/31/18 1545  anidulafungin (ERAXIS) 200 mg in sodium chloride 0.9 % 200 mL IVPB     200 mg 78 mL/hr over 200 Minutes Intravenous  Once 01/31/18 1540 01/31/18 2107   01/31/18 0930  vancomycin (VANCOCIN) IVPB 1000 mg/200 mL premix      1,000 mg 200 mL/hr over 60 Minutes Intravenous STAT 01/31/18 0922 01/31/18 1545   01/31/18 0800  meropenem (MERREM) 1 g in sodium chloride 0.9 % 100 mL IVPB     1 g 200 mL/hr over 30 Minutes Intravenous Every 8 hours 01/31/18 0748 02/07/18 2359   01/21/18 0600  ceFAZolin (ANCEF) 3 g in dextrose 5 % 50 mL IVPB     3 g 130 mL/hr over 30 Minutes Intravenous  Once 01/20/18 1829 01/21/18 0654   01/20/18 0600  vancomycin (VANCOCIN) 1,250 mg in sodium chloride 0.9 % 250 mL IVPB  Status:  Discontinued     1,250 mg 166.7 mL/hr over 90 Minutes Intravenous Every 12 hours 01/20/18 0153 01/20/18 0905   01/19/18 0200  vancomycin (VANCOCIN) 1,250 mg in sodium chloride 0.9 % 250 mL IVPB  Status:  Discontinued     1,250 mg 166.7 mL/hr over 90 Minutes Intravenous Every 8 hours 01/18/18 1953 01/20/18 0153   01/18/18 2000  vancomycin (VANCOCIN) 1,250 mg in sodium chloride 0.9 % 250 mL IVPB     1,250 mg 166.7 mL/hr over 90 Minutes Intravenous  Once 01/18/18 1953 01/18/18 2246   01/18/18 2000  ceFEPIme (MAXIPIME) 2 g in sodium chloride 0.9 % 100 mL IVPB  Status:  Discontinued     2 g 200 mL/hr over 30 Minutes Intravenous Every 8 hours 01/18/18 1953 01/20/18 0905   01/12/18 0900  vancomycin (VANCOCIN) IVPB 1000 mg/200 mL premix  Status:  Discontinued     1,000 mg 200 mL/hr over 60 Minutes Intravenous Every 24 hours 01/11/18 1329 01/13/18 1548   01/10/18 1000  ceFEPIme (MAXIPIME) 2 g in sodium chloride 0.9 % 100 mL IVPB     2 g 200 mL/hr over 30 Minutes Intravenous Every 12 hours 01/10/18 0919 01/13/18 2129   01/09/18 2200  vancomycin (VANCOCIN) IVPB 750 mg/150 ml premix  Status:  Discontinued     750 mg 150 mL/hr over 60 Minutes Intravenous Every 12 hours 01/09/18 1952 01/11/18 1329   01/08/18 1503  Ampicillin-Sulbactam (UNASYN) 3 g in sodium chloride 0.9 % 100 mL IVPB  Status:  Discontinued     3 g 200 mL/hr over 30 Minutes Intravenous Every 6 hours 01/08/18 0931 01/08/18 1024   01/08/18 1230  ceFEPIme  (MAXIPIME) 2 g in sodium chloride 0.9 % 100 mL IVPB  Status:  Discontinued     2 g 200 mL/hr over 30 Minutes Intravenous Every 12 hours 01/08/18 1151 01/10/18 0919   01/08/18 1100  metroNIDAZOLE (FLAGYL) IVPB 500 mg  Status:  Discontinued     500 mg 100 mL/hr over 60 Minutes Intravenous Every 8 hours 01/08/18 1024 01/13/18 1547   01/08/18 0800  Ampicillin-Sulbactam (UNASYN) 3 g in sodium chloride 0.9 % 100 mL IVPB  Status:  Discontinued     3 g 200 mL/hr over 30 Minutes Intravenous Every 12 hours 01/07/18 1906 01/08/18 0931  01/08/18 0600  vancomycin (VANCOCIN) IVPB 1000 mg/200 mL premix  Status:  Discontinued     1,000 mg 200 mL/hr over 60 Minutes Intravenous Every 12 hours 01/08/18 0545 01/09/18 1952   01/07/18 2100  ceFEPIme (MAXIPIME) 2 g in sodium chloride 0.9 % 100 mL IVPB  Status:  Discontinued     2 g 200 mL/hr over 30 Minutes Intravenous Every 24 hours 01/06/18 2349 01/07/18 1008   01/07/18 1800  Ampicillin-Sulbactam (UNASYN) 3 g in sodium chloride 0.9 % 100 mL IVPB  Status:  Discontinued     3 g 200 mL/hr over 30 Minutes Intravenous Every 6 hours 01/07/18 1436 01/07/18 1906   01/07/18 1015  Ampicillin-Sulbactam (UNASYN) 3 g in sodium chloride 0.9 % 100 mL IVPB  Status:  Discontinued     3 g 200 mL/hr over 30 Minutes Intravenous Every 12 hours 01/07/18 1008 01/07/18 1436   01/07/18 0600  vancomycin (VANCOCIN) 1,250 mg in sodium chloride 0.9 % 250 mL IVPB  Status:  Discontinued     1,250 mg 166.7 mL/hr over 90 Minutes Intravenous Every 24 hours 01/06/18 2349 01/07/18 1917   01/06/18 2100  ceFEPIme (MAXIPIME) 2 g in sodium chloride 0.9 % 100 mL IVPB     2 g 200 mL/hr over 30 Minutes Intravenous  Once 01/06/18 2056 01/06/18 2320   01/06/18 2100  vancomycin (VANCOCIN) IVPB 1000 mg/200 mL premix     1,000 mg 200 mL/hr over 60 Minutes Intravenous  Once 01/06/18 2056 01/07/18 0024      Microbiology: Results for orders placed or performed during the hospital encounter of 01/06/18   Blood Culture (routine x 2)     Status: None   Collection Time: 01/06/18  7:44 PM  Result Value Ref Range Status   Specimen Description BLOOD BLOOD LEFT WRIST  Final   Special Requests   Final    BOTTLES DRAWN AEROBIC AND ANAEROBIC Blood Culture adequate volume   Culture   Final    NO GROWTH 5 DAYS Performed at [facility], [address], [place], [place] [address]    Report Status 01/11/2018 FINAL  Final  Urine culture     Status: Abnormal   Collection Time: 01/06/18  7:44 PM  Result Value Ref Range Status   Specimen Description   Final    URINE, RANDOM Performed at [facility], [address], [place], [place] [address]    Special Requests   Final    NONE Performed at [facility], [address] [place], [place] [address]    Culture (A)  Final    10,000 COLONIES/mL STAPHYLOCOCCUS SPECIES (COAGULASE NEGATIVE) CALL MICROBIOLOGY LAB IF SENSITIVITIES ARE REQUIRED. Performed at [facility], [address] [place], [place] [address]    Report Status 01/08/2018 FINAL  Final  MRSA PCR Screening     Status: Abnormal   Collection Time: 01/06/18 11:51 PM  Result Value Ref Range Status   MRSA by PCR POSITIVE (A) NEGATIVE Final    Comment:        The GeneXpert MRSA Assay (FDA approved for NASAL specimens only), is one component of a comprehensive MRSA colonization surveillance program. It is not intended to diagnose MRSA infection nor to guide or monitor treatment for MRSA infections. RESULT CALLED TO, READ BACK BY AND VERIFIED WITH: BETH BUONO 01/07/18 @ 0121  MLK   Performed at [facility], [address], [place], [place] [address]   Blood Culture (routine x 2)  Status: None   Collection Time: 01/07/18  7:19 AM  Result Value Ref Range Status   Specimen Description BLOOD RIGHT HAND  Final   Special Requests   Final    BOTTLES DRAWN AEROBIC AND ANAEROBIC Blood Culture results may not be optimal due to an inadequate  volume of blood received in culture bottles   Culture   Final    NO GROWTH 5 DAYS Performed at [facility], [address] [place], [place] [address]    Report Status 01/12/2018 FINAL  Final  Culture, respiratory (NON-Expectorated)     Status: None   Collection Time: 01/07/18 12:28 PM  Result Value Ref Range Status   Specimen Description   Final    TRACHEAL ASPIRATE Performed at [facility], [address], [place], [place] [address]    Special Requests   Final    Normal Performed at [facility], [address] [place], [place] [address]    Gram Stain   Final    MODERATE WBC PRESENT,BOTH PMN AND MONONUCLEAR RARE GRAM POSITIVE COCCI RARE YEAST    Culture   Final    Consistent with normal respiratory flora. Performed at [facility], [address], [place], [place] [address]    Report Status 01/09/2018 FINAL  Final  Culture, expectorated sputum-assessment     Status: None   Collection Time: 01/18/18  5:07 PM  Result Value Ref Range Status   Specimen Description ENDOTRACHEAL  Final   Special Requests NONE  Final   Sputum evaluation   Final    THIS SPECIMEN IS ACCEPTABLE FOR SPUTUM CULTURE Performed at [facility], [address] [place], [place] [address]    Report Status 01/18/2018 FINAL  Final  Culture, respiratory (NON-Expectorated)     Status: None   Collection Time: 01/18/18  5:07 PM  Result Value Ref Range Status   Specimen Description   Final    ENDOTRACHEAL Performed at [facility], [address], [place], [place] [address]    Special Requests   Final    NONE Reflexed from [id number] Performed at [facility], [address] [place], [place] [address]    Gram Stain   Final    MODERATE WBC PRESENT,BOTH PMN AND MONONUCLEAR NO SQUAMOUS EPITHELIAL CELLS SEEN RARE BUDDING YEAST SEEN    Culture   Final    RARE Consistent with normal respiratory flora. Performed at [facility],  [address] [place], [place] [address]    Report Status 01/21/2018 FINAL  Final  MRSA PCR Screening     Status: Abnormal   Collection Time: 01/18/18  8:26 PM  Result Value Ref Range Status   MRSA by PCR POSITIVE (A) NEGATIVE Final    Comment:        The GeneXpert MRSA Assay (FDA approved for NASAL specimens only), is one component of a comprehensive MRSA colonization surveillance program. It is not intended to diagnose MRSA infection nor to guide or monitor treatment for MRSA infections. RESULT CALLED TO, READ BACK BY AND VERIFIED WITH: ANGELA BREHUN AT 2200 ON 01/18/18 RWW Performed at [facility], [address] [place], [place] [address]   CULTURE, BLOOD (ROUTINE X 2) w Reflex to ID Panel     Status: Abnormal   Collection Time: 01/31/18  8:17 AM  Result Value Ref Range Status   Specimen Description   Final    BLOOD LEFT HAND Performed at [facility], [address] [place], [place] [address]    Special Requests   Final    BOTTLES DRAWN AEROBIC AND ANAEROBIC Blood Culture results may not be optimal due to an excessive volume of blood received in culture bottles Performed at [facility], [address] [place], [place] [address]    Culture  Setup Time   Final    GRAM NEGATIVE RODS AEROBIC BOTTLE ONLY CRITICAL RESULT CALLED TO, READ BACK BY AND VERIFIED WITH: DAVID BESANTI ON 02/05/18 AT 0228 JAG Performed at [facility], [address], [place], [place] [address]    Culture KLEBSIELLA PNEUMONIAE (A)  Final   Report Status 02/07/2018 FINAL  Final   Organism ID, Bacteria KLEBSIELLA PNEUMONIAE  Final      Susceptibility   Klebsiella pneumoniae - MIC*    AMPICILLIN >=32 RESISTANT Resistant     CEFAZOLIN <=4 SENSITIVE Sensitive     CEFEPIME <=1 SENSITIVE Sensitive     CEFTAZIDIME <=1 SENSITIVE Sensitive     CEFTRIAXONE <=1 SENSITIVE Sensitive     CIPROFLOXACIN <=0.25 SENSITIVE Sensitive     GENTAMICIN <=1 SENSITIVE Sensitive      IMIPENEM <=0.25 SENSITIVE Sensitive     TRIMETH/SULFA <=20 SENSITIVE Sensitive     AMPICILLIN/SULBACTAM >=32 RESISTANT Resistant     PIP/TAZO 16 SENSITIVE Sensitive     Extended ESBL NEGATIVE Sensitive     * KLEBSIELLA PNEUMONIAE  Blood Culture ID Panel (Reflexed)     Status: Abnormal   Collection Time: 01/31/18  8:17 AM  Result Value Ref Range Status   Enterococcus species NOT DETECTED NOT DETECTED Final   Listeria monocytogenes NOT DETECTED NOT DETECTED Final   Staphylococcus species NOT DETECTED NOT DETECTED Final   Staphylococcus aureus NOT DETECTED NOT DETECTED Final   Streptococcus species NOT DETECTED NOT DETECTED Final   Streptococcus agalactiae NOT DETECTED NOT DETECTED Final   Streptococcus pneumoniae NOT DETECTED NOT DETECTED Final   Streptococcus pyogenes NOT DETECTED NOT DETECTED Final   Acinetobacter baumannii NOT DETECTED NOT DETECTED Final   Enterobacteriaceae species DETECTED (A) NOT DETECTED Final    Comment: Enterobacteriaceae represent a large family of gram-negative bacteria, not a single organism. CRITICAL RESULT CALLED TO, READ BACK BY AND VERIFIED WITH: DAVID BESANTI ON 02/05/18 AT 0228 JAG    Enterobacter cloacae complex NOT DETECTED NOT DETECTED Final   Escherichia coli NOT DETECTED NOT DETECTED Final   Klebsiella oxytoca NOT DETECTED NOT DETECTED Final   Klebsiella pneumoniae DETECTED (A) NOT DETECTED Final    Comment: CRITICAL RESULT CALLED TO, READ BACK BY AND VERIFIED WITH: DAVID BESANTI ON 02/05/18 AT 0228 JAG    Proteus species NOT DETECTED NOT DETECTED Final   Serratia marcescens NOT DETECTED NOT DETECTED Final   Carbapenem resistance NOT DETECTED NOT DETECTED Final   Haemophilus influenzae NOT DETECTED NOT DETECTED Final   Neisseria meningitidis NOT DETECTED NOT DETECTED Final   Pseudomonas aeruginosa NOT DETECTED NOT DETECTED Final   Candida albicans NOT DETECTED NOT DETECTED Final   Candida glabrata NOT DETECTED NOT DETECTED Final   Candida  krusei NOT DETECTED NOT DETECTED Final   Candida parapsilosis NOT DETECTED NOT DETECTED Final   Candida tropicalis NOT DETECTED NOT DETECTED Final    Comment: Performed at [facility], [address] [place], [place] [address]  Urine Culture     Status: None   Collection Time: 01/31/18  8:30 AM  Result Value Ref Range Status   Specimen Description   Final  URINE, RANDOM Performed at [facility], [address], [place], [place] [address]    Special Requests   Final    NONE Performed at [facility], [address] [place], [place] [address]    Culture   Final    NO GROWTH Performed at [facility], [address] [place], [place] [address]    Report Status 02/01/2018 FINAL  Final  CULTURE, BLOOD (ROUTINE X 2) w Reflex to ID Panel     Status: Abnormal   Collection Time: 01/31/18  8:53 AM  Result Value Ref Range Status   Specimen Description   Final    BLOOD BLOOD RIGHT HAND Performed at [facility], [address], [place], [place] [address]    Special Requests   Final    BOTTLES DRAWN AEROBIC AND ANAEROBIC Blood Culture adequate volume Performed at [facility], [address], [place], [place] [address]    Culture  Setup Time   Final    GRAM NEGATIVE RODS AEROBIC BOTTLE ONLY CRITICAL RESULT CALLED TO, READ BACK BY AND VERIFIED WITH: DAVID BESANTI AT 0340 ON 02/05/18 [facility]. Performed at [facility], [address] [place], [place] [address]    Culture (A)  Final    KLEBSIELLA PNEUMONIAE SUSCEPTIBILITIES PERFORMED ON PREVIOUS CULTURE WITHIN THE LAST 5 DAYS. Performed at [facility], [address], [place], [place] [address]    Report Status 02/07/2018 FINAL  Final  Culture, respiratory (NON-Expectorated)     Status: None   Collection Time: 01/31/18 10:47 AM  Result Value Ref Range Status   Specimen Description   Final    TRACHEAL ASPIRATE Performed at [facility], [address] [place], [place] [address]    Special Requests   Final    NONE Performed at [facility], [address] [place], [place] [address]    Gram Stain   Final    MODERATE WBC PRESENT, PREDOMINANTLY PMN MODERATE GRAM POSITIVE COCCI MODERATE GRAM POSITIVE RODS    Culture   Final    Consistent with normal respiratory flora. Performed at [facility], [address], [place], [place] [address]    Report Status 02/02/2018 FINAL  Final  CULTURE, BLOOD (ROUTINE X 2) w Reflex to ID Panel     Status: None (Preliminary result)   Collection Time: 02/04/18  5:41 PM  Result Value Ref Range Status   Specimen Description BLOOD BLOOD LEFT HAND  Final   Special Requests   Final    BOTTLES DRAWN AEROBIC AND ANAEROBIC Blood Culture adequate volume   Culture   Final    NO GROWTH 3 DAYS Performed at [facility], [address], [place], [place] [address]    Report Status PENDING  Incomplete  CULTURE, BLOOD (ROUTINE X 2) w Reflex to ID Panel     Status: None (Preliminary result)   Collection Time: 02/04/18  8:50 PM  Result Value Ref Range Status   Specimen Description BLOOD LEFT HAND  Final   Special Requests   Final    BOTTLES DRAWN AEROBIC AND ANAEROBIC Blood Culture adequate volume   Culture   Final    NO GROWTH 2 DAYS Performed at [facility], [address], [place], [place] [address]    Report Status PENDING  Incomplete  MRSA PCR Screening     Status: None   Collection Time: 02/05/18  9:38 AM  Result Value Ref Range Status  MRSA by PCR NEGATIVE NEGATIVE Final    Comment:        The GeneXpert MRSA Assay (FDA approved for NASAL specimens only), is one component of a comprehensive MRSA colonization surveillance program. It is not intended to diagnose MRSA infection nor to guide or monitor treatment for MRSA infections. Performed at [facility], [address] [place], [place] [address]     Best Practice/Protocols:     Events:  Studies: Dg Abd 1 View  Result Date: 01/27/2018 CLINICAL DATA:  Ileus EXAM: ABDOMEN - 1 VIEW COMPARISON:  01/24/2018 FINDINGS: Diffuse gaseous distention of bowel again noted compatible with ileus, unchanged. No free air or organomegaly. No acute bony abnormality. IMPRESSION: Stable ileus pattern. Electronically Signed   By: Charlett Nose M.D.   On: 01/27/2018 07:51   Dg Abd 1 View  Result Date: 01/24/2018 CLINICAL DATA:  Constipation EXAM: ABDOMEN - 1 VIEW COMPARISON:  01/13/2018 FINDINGS: A gastrostomy tube is demonstrated over the stomach. There is mild gaseous distention of the stomach, colon, and small bowel. Prominent stool in the rectum. Changes may be due to adynamic ileus or pseudo-obstruction from impacted rectal stool. No radiopaque stones. Infiltration or atelectasis in the lung bases. IMPRESSION: Mildly gas distended stomach, small bowel, and colon with prominent stool in the rectum suggesting adynamic ileus or pseudo-obstruction due to stool impaction. Electronically Signed   By: Burman Nieves M.D.   On: 01/24/2018 23:47   Dg Abd 1 View  Result Date: 01/13/2018 CLINICAL DATA:  Orogastric tube placement EXAM: ABDOMEN - 1 VIEW COMPARISON:  CT 01/06/2018 FINDINGS: Consolidation at the left lung base. Esophageal tube tip projects over the proximal stomach. Air-filled dilated bowel in the upper abdomen. IMPRESSION: Esophageal tube tip overlies the proximal stomach. There is consolidation at the left lung base. Electronically Signed   By: Jasmine Pang M.D.   On: 01/13/2018 21:32   Dg Chest Port 1 View  Result Date: 02/08/2018 CLINICAL DATA:  Acute respiratory failure EXAM: PORTABLE CHEST 1 VIEW COMPARISON:  Feb 06, 2018. FINDINGS: The heart size and mediastinal contours are stable. The heart size is enlarged. Tracheostomy tube is identified in good position. Right central venous line is identified with distal tip in the superior vena cava. Diffuse opacity is identified  throughout bilateral lungs. There are probable bilateral pleural effusions. The visualized skeletal structures are stable. IMPRESSION: Diffuse airspace disease identified in both lungs unchanged compared to prior exam. Probable small bilateral pleural effusions. Electronically Signed   By: Sherian Rein M.D.   On: 02/08/2018 07:14   Dg Chest Port 1 View  Result Date: 02/06/2018 CLINICAL DATA:  Intubated patient with sepsis, pneumonia and possible aspiration. EXAM: PORTABLE CHEST 1 VIEW COMPARISON:  Single-view of the chest 02/05/2018 and 02/04/2018. FINDINGS: Tracheostomy tube and right PICC remain in place. Diffuse bilateral airspace disease is unchanged. No pneumothorax or pleural effusion. IMPRESSION: No change in diffuse bilateral airspace disease. Electronically Signed   By: Drusilla Kanner M.D.   On: 02/06/2018 11:04   Dg Chest Port 1 View  Result Date: 02/05/2018 CLINICAL DATA:  57 year old male with acute respiratory failure. EXAM: PORTABLE CHEST 1 VIEW COMPARISON:  Chest radiograph dated 02/04/2018 FINDINGS: Right-sided PICC in similar position. Tracheostomy above the carina. Diffuse airspace opacity throughout the lungs with no significant interval change compared to the prior radiograph. No pneumothorax. The cardiac borders are silhouetted. No acute osseous pathology. IMPRESSION: No significant interval change. Electronically Signed   By: Ceasar Mons.D.  On: 02/05/2018 06:22   Dg Chest Port 1 View  Result Date: 02/04/2018 CLINICAL DATA:  Acute respiratory failure, pulmonary edema, sepsis, hypertension, diabetes, current smoker. EXAM: PORTABLE CHEST 1 VIEW COMPARISON:  Portable chest x-ray of Feb 03, 2018 FINDINGS: Diffuse airspace opacities have worsened bilaterally. The cardiac silhouette is mildly enlarged. The pulmonary vascularity is indistinct. There is calcification in the wall of the aortic arch. There may be pleural fluid layering posteriorly on the left. The tracheostomy tube tip  projects between the clavicular heads. The right sided PICC line tip projects over the midportion of the SVC. IMPRESSION: Worsening airspace opacities bilaterally consistent with progressive pneumonia or pulmonary edema. Thoracic aortic atherosclerosis. Electronically Signed   By: David  [place] M.D.   On: 02/04/2018 10:28   Dg Chest Port 1 View  Result Date: 02/03/2018 CLINICAL DATA:  Follow-up pneumonia EXAM: PORTABLE CHEST 1 VIEW COMPARISON:  02/02/2018 FINDINGS: Cardiac shadow is stable. Right-sided PICC line and tracheostomy tube are noted and stable. Diffuse bilateral infiltrates are again seen and stable. No bony abnormality is noted. IMPRESSION: Stable appearance when compared with the previous exam. Electronically Signed   By: Alcide Clever M.D.   On: 02/03/2018 07:15   Dg Chest Port 1 View  Result Date: 02/02/2018 CLINICAL DATA:  Pneumonia EXAM: PORTABLE CHEST 1 VIEW COMPARISON:  Chest radiograph from one day prior. FINDINGS: Tracheostomy tube tip overlies the tracheal air column at the thoracic inlet. Right PICC terminates at the cavoatrial junction. Stable cardiomediastinal silhouette with normal heart size. No pneumothorax. No pleural effusion. Severe patchy opacity throughout both lungs is not appreciably changed. IMPRESSION: 1. Stable support structures as detailed. 2. Stable severe diffuse patchy lung opacities suggesting multilobar pneumonia and/or ARDS. Electronically Signed   By: Delbert Phenix M.D.   On: 02/02/2018 07:14   Dg Chest Port 1 View  Result Date: 02/01/2018 CLINICAL DATA:  Pneumonia EXAM: PORTABLE CHEST 1 VIEW COMPARISON:  Chest radiograph from one day prior. FINDINGS: Tracheostomy tube tip overlies the tracheal air column at the thoracic inlet. Right PICC terminates at the cavoatrial junction. Stable cardiomediastinal silhouette with normal heart size. No pneumothorax. No pleural effusion. Hazy airspace opacities throughout both lungs are slightly improved. IMPRESSION: Hazy  airspace opacities throughout both lungs, slightly improved. Electronically Signed   By: Delbert Phenix M.D.   On: 02/01/2018 09:03   Dg Chest Port 1 View  Result Date: 01/31/2018 CLINICAL DATA:  PICC line placement. EXAM: PORTABLE CHEST 1 VIEW COMPARISON:  01/31/2018 and 01/30/2018. FINDINGS: 1225 hours. Right arm PICC extends to the level of the superior cavoatrial junction. Tracheostomy appears unchanged. There are stable diffuse bilateral pulmonary opacities. No pneumothorax or significant pleural effusion. The heart size and mediastinal contours are grossly stable, partially obscured by the airspace opacities. IMPRESSION: PICC line terminates at the level of the superior cavoatrial junction. Persistent bilateral airspace opacities. Electronically Signed   By: Carey Bullocks M.D.   On: 01/31/2018 12:52   Dg Chest Port 1 View  Result Date: 01/31/2018 CLINICAL DATA:  Congestive heart failure. EXAM: PORTABLE CHEST 1 VIEW COMPARISON:  Radiograph of Jan 30, 2018. FINDINGS: Stable cardiomediastinal silhouette. Tracheostomy tube and right sided PICC line are unchanged in position. Hypoinflation of the lungs is noted. Stable bilateral lung opacities are noted concerning for edema or pneumonia. No pneumothorax or significant pleural effusion is noted. Bony thorax is unremarkable. IMPRESSION: Stable support apparatus. Hypoinflation of the lungs. Stable bilateral lung opacities concerning for edema or pneumonia. Electronically Signed   By:  Lupita Raider, M.D.   On: 01/31/2018 07:13   Dg Chest Port 1 View  Result Date: 01/30/2018 CLINICAL DATA:  Congestive heart failure EXAM: PORTABLE CHEST 1 VIEW COMPARISON:  Jan 29, 2018 FINDINGS: Tracheostomy catheter tip is 5.6 cm above the carina. Central catheter tip is in the superior vena cava. No pneumothorax. There remains cardiomegaly with pulmonary venous hypertension. There is patchy interstitial and alveolar opacity, likely edema. No adenopathy. No bone lesions.  IMPRESSION: Tube and catheter positions as described without pneumothorax. Pulmonary vascular congestion with interstitial and alveolar edema, stable from 1 day prior. No new opacity evident. Electronically Signed   By: [name].   On: 01/30/2018 09:07   Dg Chest Port 1 View  Result Date: 01/29/2018 CLINICAL DATA:  Shortness of breath EXAM: PORTABLE CHEST 1 VIEW COMPARISON:  January 28, 2018 FINDINGS: Tracheostomy catheter tip is 5.0 cm above the carina. Central catheter tip is in the superior vena cava. No pneumothorax. There is diffuse airspace opacity throughout the lungs bilaterally, similar to 1 day prior. There are small pleural effusions bilaterally. No new opacity evident. Heart is enlarged with pulmonary venous hypertension. No adenopathy appreciable. No bone lesions. IMPRESSION: Tube and catheter positions as described without pneumothorax. Cardiomegaly with pulmonary venous hypertension, consistent with pulmonary vascular congestion. Small pleural effusions bilaterally. Widespread airspace opacity bilaterally may represent alveolar edema or pneumonia. Both entities may exist concurrently. A degree of underlying ARDS is also possible. The overall appearance is essentially stable compared to 1 day prior. Electronically Signed   By: [name].   On: 01/29/2018 07:45   Dg Chest Port 1 View  Result Date: 01/28/2018 CLINICAL DATA:  Pneumonia EXAM: PORTABLE CHEST 1 VIEW COMPARISON:  01/23/2018 FINDINGS: Tracheostomy and right PICC line remain in place, unchanged. Cardiomegaly. Vascular congestion and diffuse bilateral airspace opacities are again noted, unchanged. Low lung volumes. No visible effusions or acute bony abnormality. IMPRESSION: Cardiomegaly with vascular congestion and diffuse bilateral airspace disease which could reflect edema or infection. No change. Electronically Signed   By: Charlett Nose M.D.   On: 01/28/2018 09:21   Dg Chest Port 1 View  Result Date:  01/23/2018 CLINICAL DATA:  Tracheostomy placement EXAM: PORTABLE CHEST 1 VIEW COMPARISON:  January 21, 2018 FINDINGS: Tracheostomy catheter tip is 4.1 cm above the carina. Central catheter tip is at the cavoatrial junction. No pneumothorax. There is persistent moderate interstitial and patchy alveolar edema with small pleural effusions bilaterally. Heart is mildly enlarged with pulmonary venous hypertension. No adenopathy. No bone lesions. IMPRESSION: Tube and catheter positions as described without pneumothorax. Evidence of a degree of congestive heart failure, similar to most recent prior study. Electronically Signed   By: [name].   On: 01/23/2018 12:06   Dg Chest Port 1 View  Result Date: 01/21/2018 CLINICAL DATA:  Hypoxia EXAM: PORTABLE CHEST 1 VIEW COMPARISON:  January 19, 2018 FINDINGS: Endotracheal tube tip is 3.7 cm above the carina. Nasogastric tube tip and side port are below the diaphragm. Central catheter tip is in the superior vena cava. No pneumothorax. There is moderate interstitial and patchy alveolar opacities, slightly increased. There is a small left pleural effusion. Heart is upper normal in size with pulmonary venous hypertension. No adenopathy. No bone lesions. IMPRESSION: Tube and catheter positions as described without pneumothorax. Increase interstitial and patchy alveolar pulmonary edema, likely indicative of a degree of congestive heart failure. Small left pleural effusion. Underlying pulmonary vascular congestion. Electronically Signed  By: [name].   On: 01/21/2018 07:04   Dg Chest Port 1 View  Result Date: 01/19/2018 CLINICAL DATA:  Acute respiratory failure. EXAM: PORTABLE CHEST 1 VIEW COMPARISON:  01/18/2018 FINDINGS: Endotracheal tube terminates 4 cm above the carina. Enteric tube can be followed to the GE junction but is not well visualized beyond this due to under penetration in the upper abdomen. Right PICC terminates at the cavoatrial  junction. The cardiomediastinal silhouette is unchanged allowing for differences in patient rotation. Interstitial and patchy airspace opacities are again seen bilaterally with mild improvement in the right base and without significant interval change on the left. No large pleural effusion or pneumothorax is identified. IMPRESSION: Bilateral interstitial and airspace opacities, slightly improved on the right and likely reflecting pneumonia. Electronically Signed   By: Sebastian Ache M.D.   On: 01/19/2018 07:49   Dg Chest Port 1 View  Result Date: 01/18/2018 CLINICAL DATA:  Oxygen desaturation EXAM: PORTABLE CHEST 1 VIEW COMPARISON:  01/18/2018 FINDINGS: Endotracheal tube, right PICC line and NG tube remain in place, unchanged. Bilateral interstitial and alveolar opacities are again noted, unchanged. Small bilateral effusions suspected. Heart is upper limits normal in size. IMPRESSION: Bilateral interstitial and airspace opacities with small effusions. No real change. Electronically Signed   By: Charlett Nose M.D.   On: 01/18/2018 16:39   Dg Chest Port 1 View  Result Date: 01/18/2018 CLINICAL DATA:  Acute respiratory failure. EXAM: PORTABLE CHEST 1 VIEW COMPARISON:  01/16/2018 and CT chest 01/06/2018. FINDINGS: Endotracheal tube terminates 4.9 cm above the carina. Nasogastric tube is followed into the stomach. Right PICC tip projects over the SVC RA junction. Heart size within normal limits. Lungs are somewhat low in volume with diffuse mixed interstitial and airspace opacification, possibly mildly worsened from 01/16/2018. There may be right middle lobe and left lower lobe consolidation. No definite pleural fluid. IMPRESSION: Mixed interstitial and airspace opacification, possibly slightly worsened from 01/16/2018, with possible consolidation in the right middle and left lower lobes. Findings are indicative of persistent pneumonia, especially when compared with 01/06/2018. Electronically Signed   By: Leanna Battles M.D.   On: 01/18/2018 08:12   Dg Chest Port 1 View  Result Date: 01/16/2018 CLINICAL DATA:  Hypoxia and acute respiratory failure. EXAM: PORTABLE CHEST 1 VIEW COMPARISON:  None. FINDINGS: Cardiomediastinal silhouette is unchanged. An endotracheal tube with tip 3.2 cm above the carina, NG tube entering the stomach, RIGHT PICC line with tip overlying the SUPERIOR cavoatrial junction are again noted. Interstitial/airspace opacities are again noted with slight improved aeration on the LEFT. Slightly increased RIGHT UPPER lobe atelectasis noted. There is no evidence of pneumothorax. IMPRESSION: Slightly increased RIGHT UPPER lobe atelectasis and slightly improved LEFT lung aeration. Otherwise no significant change. Electronically Signed   By: Harmon Pier M.D.   On: 01/16/2018 20:49   Dg Chest Port 1 View  Result Date: 01/13/2018 CLINICAL DATA:  Respiratory failure. EXAM: PORTABLE CHEST 1 VIEW COMPARISON:  One-view chest x-ray 01/12/2018. FINDINGS: Heart size is exaggerate by low lung volumes. Endotracheal tube is stable. Left greater than right interstitial and airspace disease is similar the prior exam. Overall lung volumes are slightly improved. IMPRESSION: 1. Stable appearance of left greater than right interstitial and airspace disease, likely a combination of edema and possible infection. 2. Support apparatus is stable. 3. Slight improvement in lung volumes. Electronically Signed   By: Marin Roberts M.D.   On: 01/13/2018 07:49   Dg Chest [facility]  Result Date: 01/12/2018 CLINICAL DATA:  Acute respiratory failure with hypoxia. Severe sepsis with septic shock. On ventilator. EXAM: PORTABLE CHEST 1 VIEW COMPARISON:  01/11/2018 FINDINGS: Endotracheal tube and nasogastric tube remain in appropriate position. Heart size remains within normal limits. Low lung volumes again seen with diffuse interstitial infiltrates. No evidence of focal consolidation or significant pleural effusion. IMPRESSION:  Stable low lung volumes with diffuse interstitial infiltrates/edema. Electronically Signed   By: Myles Rosenthal M.D.   On: 01/12/2018 07:37   Dg Chest Port 1 View  Result Date: 01/11/2018 CLINICAL DATA:  Respiratory failure EXAM: PORTABLE CHEST 1 VIEW COMPARISON:  January 10, 2018 FINDINGS: The ETT terminates 2.8 cm above the carina. No pneumothorax. The cardiomediastinal silhouette is stable. Pulmonary edema has decreased but persists. No other changes. IMPRESSION: 1. Stable support apparatus. 2. Persistent but decreased pulmonary edema. 3. No other change. Electronically Signed   By: Gerome Sam III M.D   On: 01/11/2018 07:01   Dg Chest Port 1 View  Result Date: 01/10/2018 CLINICAL DATA:  Respiratory failure EXAM: PORTABLE CHEST 1 VIEW COMPARISON:  01/08/2018 FINDINGS: Cardiac shadow is stable. Endotracheal tube and nasogastric catheter are again noted in satisfactory position. Bilateral infiltrates are again identified and stable. No new focal abnormality is seen. No bony abnormality is noted. IMPRESSION: Stable bilateral infiltrates. Electronically Signed   By: Alcide Clever M.D.   On: 01/10/2018 07:24   [place] Ekg Site Rite  Result Date: 01/13/2018 If Site Rite image not attached, placement could not be confirmed due to current cardiac rhythm.   Consults: Treatment Team:  Marcina Millard, MD Mick Sell, MD   Subjective:    Overnight patient Did not have any events. Still requiring high FiO2 at 90 and 10 of PEEP.   Objective:  Vital signs for last 24 hours: Temp:  [99.2 F (37.3 C)-100 F (37.8 C)] 100 F (37.8 C) (05/11 0400) Pulse Rate:  [50-77] 59 (05/11 0700) Resp:  [16-34] 17 (05/11 0700) BP: (107-125)/(46-78) 108/62 (05/11 0700) SpO2:  [85 %-100 %] 94 % (05/11 0713) FiO2 (%):  [85 %-100 %] 90 % (05/11 0713)  Hemodynamic parameters for last 24 hours:    Intake/Output from previous day: 05/10 0701 - 05/11 0700 In: 3246.3 [I.V.:856.3; NG/GT:1760; IV  Piggyback:600] Out: 3865 [Urine:3865]  Intake/Output this shift: No intake/output data recorded.  Vent settings for last 24 hours: Vent Mode: PRVC FiO2 (%):  [85 %-100 %] 90 % Set Rate:  [14 bmp] 14 bmp Vt Set:  [500 mL] 500 mL PEEP:  [10 cmH20] 10 cmH20 Plateau Pressure:  [26 cmH20] 26 cmH20  Physical Exam:  Patient is on mechanical ventilation through tracheostomy. Appears to track and minimally respond Vital signs: Please see the above listed vital signs HEENT: Tracheostomy in place with mechanical ventilation, trachea is midline, mild increased work of breathing, no noted jugular venous distention Cardiovascular: Regular rate and rhythm,  Pulmonary: Coarse expiratory rhonchi noted, inspiratory crackles appreciated bilateral Abdominal: PEG tube noted to be in place, mild distention, bowel sounds appreciated Extremity: Edema noted Neurologic: Patient moves extremities, appears to track  Assessment/Plan:   Patient 57 year old gentleman with traumatic brain injury with acute respiratory failure, severe bilateral pneumonia aspiration, sepsis. Still requiring high PEEP and FiO2. Pending morning CXR. Continue to diurese if blood pressure is not prohibitive. Presently being followed by infectious disease, on meropenem, vancomycin. Positive blood cultures for Enterobacter and Klebsiella. We'll continue to try to diurese monitoring CXR, BUN/Cr and BP, however physiology may be  more consistent with ARDS and volume  History of atrial fibrillation. On amiodarone And systemic anticoagulation  History of dilated cardiomyopathy with ejection fraction 20%  Anemia. No clear evidence of active bleeding. Hemoglobin is 6.9. Will give one unit of blood  Leukocytosis. White count is 12.2   Jameka Ivie 02/08/2018  *Care during the described time interval was provided by me and/or other providers on the critical care team.  I have reviewed this patient's available data, including medical history,  events of note, physical examination and test results as part of my evaluation. Patient ID: Elery Cadenhead, male   DOB: 1960-12-28, 57 y.o.   MRN: [id number] Patient ID: Harrold Fitchett, male   DOB: [birth year], 57 y.o.   MRN: [id number] Patient ID: Pape Parson, male   DOB: 1960/12/14, 57 y.o.   MRN: [id number] Patient ID: Corben Auzenne, male   DOB: 04-14-1961, 57 y.o.   MRN: [id number] Patient ID: Selvin Yun, male   DOB: 28-Nov-1960, 57 y.o.   MRN: [id number]

## 2018-02-08 NOTE — Progress Notes (Signed)
Pharmacy Electrolyte Monitoring Consult:  Pharmacy consulted to assist in monitoring and replacing electrolytes in this 57 y.o. male admitted on 01/06/2018   Patient requiring mechanical ventilation and has PEG tube.   Patient ordered furosemide  IV daily.  Labs:  Sodium (mmol/L)  Date Value  02/08/2018 138   Potassium (mmol/L)  Date Value  02/08/2018 4.3   Magnesium (mg/dL)  Date Value  96/12/5407 1.7   Phosphorus (mg/dL)  Date Value  81/19/1478 4.0   Calcium (mg/dL)  Date Value  29/56/2130 8.2 (L)   Albumin (g/dL)  Date Value  86/57/8469 1.6 (L)    Plan: Magnesium 1.7. Goal >2. Will order magnesium 2g IV x 1 dose. No additonal replacement warranted at this time.   Will  resume Q48hr electrolyte checks. Labs ordered for 5/13.  Pharmacy will continue to monitor and adjust per consult.   Gardner Candle, PharmD, BCPS Clinical Pharmacist 02/08/2018 8:34 AM

## 2018-02-09 ENCOUNTER — Inpatient Hospital Stay: Payer: Medicaid Other

## 2018-02-09 LAB — TRANSFUSION REACTION
DAT C3: NEGATIVE
Post RXN DAT IgG: POSITIVE

## 2018-02-09 LAB — BLOOD GAS, ARTERIAL
Acid-Base Excess: 21.3 mmol/L — ABNORMAL HIGH (ref 0.0–2.0)
BICARBONATE: 50.8 mmol/L — AB (ref 20.0–28.0)
FIO2: 100
O2 Saturation: 84.6 %
PEEP/CPAP: 15 cmH2O
PO2 ART: 52 mmHg — AB (ref 83.0–108.0)
PRESSURE CONTROL: 30 cmH2O
Patient temperature: 37
pCO2 arterial: 92 mmHg (ref 32.0–48.0)
pH, Arterial: 7.35 (ref 7.350–7.450)

## 2018-02-09 LAB — GLUCOSE, CAPILLARY
GLUCOSE-CAPILLARY: 176 mg/dL — AB (ref 65–99)
GLUCOSE-CAPILLARY: 189 mg/dL — AB (ref 65–99)
GLUCOSE-CAPILLARY: 145 mg/dL — AB (ref 65–99)
Glucose-Capillary: 172 mg/dL — ABNORMAL HIGH (ref 65–99)
Glucose-Capillary: 136 mg/dL — ABNORMAL HIGH (ref 65–99)

## 2018-02-09 LAB — CULTURE, BLOOD (ROUTINE X 2)
Culture: NO GROWTH
SPECIAL REQUESTS: ADEQUATE

## 2018-02-09 LAB — CBC
HEMATOCRIT: 20.5 % — AB (ref 40.0–52.0)
Hemoglobin: 7 g/dL — ABNORMAL LOW (ref 13.0–18.0)
MCH: 29 pg (ref 26.0–34.0)
MCHC: 34.2 g/dL (ref 32.0–36.0)
MCV: 84.8 fL (ref 80.0–100.0)
Platelets: 361 10*3/uL (ref 150–440)
RBC: 2.42 MIL/uL — AB (ref 4.40–5.90)
RDW: 18.2 % — ABNORMAL HIGH (ref 11.5–14.5)
WBC: 11.9 10*3/uL — AB (ref 3.8–10.6)

## 2018-02-09 LAB — PHOSPHORUS: PHOSPHORUS: 4.1 mg/dL (ref 2.5–4.6)

## 2018-02-09 LAB — BASIC METABOLIC PANEL
Anion gap: 3 — ABNORMAL LOW (ref 5–15)
BUN: 31 mg/dL — AB (ref 6–20)
CALCIUM: 8 mg/dL — AB (ref 8.9–10.3)
CO2: 42 mmol/L — ABNORMAL HIGH (ref 22–32)
CREATININE: 0.78 mg/dL (ref 0.61–1.24)
Chloride: 91 mmol/L — ABNORMAL LOW (ref 101–111)
GFR calc Af Amer: >60 mL/min (ref >60)
GLUCOSE: 193 mg/dL — AB (ref 65–99)
Potassium: 4.4 mmol/L (ref 3.5–5.1)
Sodium: 136 mmol/L (ref 135–145)

## 2018-02-09 LAB — MAGNESIUM: Magnesium: 1.8 mg/dL (ref 1.7–2.4)

## 2018-02-09 MED ORDER — FENTANYL CITRATE (PF) 100 MCG/2ML IJ SOLN
INTRAMUSCULAR | Status: AC
  Administered 2018-02-09: 50 ug via INTRAVENOUS
  Filled 2018-02-09: qty 2

## 2018-02-09 MED ORDER — FENTANYL CITRATE (PF) 100 MCG/2ML IJ SOLN
50.0000 ug | Freq: Once | INTRAMUSCULAR | Status: AC
  Administered 2018-02-09: 50 ug via INTRAVENOUS

## 2018-02-09 MED ORDER — VECURONIUM BROMIDE 10 MG IV SOLR
10.0000 mg | Freq: Once | INTRAVENOUS | Status: AC
  Administered 2018-02-09: 10 mg via INTRAVENOUS

## 2018-02-09 MED ORDER — FENTANYL 2500MCG IN NS 250ML (10MCG/ML) PREMIX INFUSION
INTRAVENOUS | Status: AC
  Administered 2018-02-09: 50 ug/h via INTRAVENOUS
  Filled 2018-02-09: qty 250

## 2018-02-09 MED ORDER — VECURONIUM BROMIDE 10 MG IV SOLR
INTRAVENOUS | Status: AC
  Administered 2018-02-09: 10 mg via INTRAVENOUS
  Filled 2018-02-09: qty 10

## 2018-02-09 MED ORDER — FENTANYL 2500MCG IN NS 250ML (10MCG/ML) PREMIX INFUSION
0.0000 ug/h | INTRAVENOUS | Status: DC
  Administered 2018-02-09: 50 ug/h via INTRAVENOUS
  Administered 2018-02-10 (×3): 400 ug/h via INTRAVENOUS
  Administered 2018-02-10: 300 ug/h via INTRAVENOUS
  Administered 2018-02-11: 400 ug/h via INTRAVENOUS
  Filled 2018-02-09 (×4): qty 250

## 2018-02-09 MED ORDER — DOPAMINE-DEXTROSE 3.2-5 MG/ML-% IV SOLN
INTRAVENOUS | Status: AC
  Administered 2018-02-09: 5 ug/kg/min via INTRAVENOUS
  Filled 2018-02-09: qty 250

## 2018-02-09 MED ORDER — DOPAMINE-DEXTROSE 3.2-5 MG/ML-% IV SOLN
0.0000 ug/kg/min | INTRAVENOUS | Status: DC
  Administered 2018-02-09: 5 ug/kg/min via INTRAVENOUS

## 2018-02-09 NOTE — Progress Notes (Signed)
Patient has been awake on and off this shift. Has had periods of sats in the 80s. RT had to suction thick white sputum at times. Continue on Precedex. Now at 1 mcg due to restlessness. Good urinary output.

## 2018-02-09 NOTE — Progress Notes (Signed)
Follow up - Critical Care Medicine Note  Patient Details:    Tommy Mathews is an 57 y.o. male.with a past medical history remarkable for diabetes, gastroesophageal reflux disease, hyperlipidemia, hypertension, traumatic brain injury, status post tracheostomy and PEG tube placement, is in the intensive care unit for sepsis, severe pneumonia, suspected aspiration  Lines, Airways, Drains: PICC Triple Lumen 01/31/18 PICC Right 46 cm 0 cm (Active)  Indication for Insertion or Continuance of Line Administration of hyperosmolar/irritating solutions (i.e. TPN, Vancomycin, etc.) 02/03/2018  8:00 AM  Exposed Catheter (cm) 0 cm 01/31/2018 12:00 PM  Site Assessment Clean;Dry;Intact 02/02/2018  8:50 PM  Lumen #1 Status Infusing 02/02/2018  8:50 PM  Lumen #2 Status Infusing 02/02/2018  8:50 PM  Lumen #3 Status Saline locked 02/02/2018  8:50 PM  Dressing Type Transparent 02/02/2018  8:50 PM  Dressing Status Clean;Dry;Intact 02/02/2018  8:50 PM  Line Care Connections checked and tightened 02/02/2018  8:50 PM  Line Adjustment (NICU/IV Team Only) No 02/01/2018 12:39 PM  Dressing Change Due 02/07/18 02/02/2018 11:06 AM     Gastrostomy/Enterostomy PEG-jejunostomy (Active)  Surrounding Skin Dry;Intact 02/03/2018  3:42 AM  Tube Status Patent 02/03/2018  3:42 AM  Drainage Appearance None 02/03/2018  3:42 AM  Dressing Status Clean;Dry;Intact 02/03/2018  3:42 AM  Dressing Intervention New dressing 01/31/2018  6:30 PM  Dressing Type Split gauze 02/03/2018  3:42 AM  Dressing Change Due 02/06/18 02/02/2018 11:30 PM  G Port Intake (mL) 80 ml 01/30/2018  5:30 PM     Urethral Catheter (Active)  Indication for Insertion or Continuance of Catheter Bladder outlet obstruction / other urologic reason 02/03/2018  3:42 AM  Site Assessment Intact;Clean 02/03/2018  3:42 AM  Catheter Maintenance Bag below level of bladder;No dependent loops;Seal intact;Bag emptied prior to transport 02/03/2018  3:42 AM  Collection Container Standard drainage bag 02/03/2018  3:42 AM   Securement Method Leg strap 02/03/2018  3:42 AM  Urinary Catheter Interventions Unclamped 02/03/2018  3:42 AM  Input (mL) 0 mL 01/15/2018 11:47 PM  Output (mL) 675 mL 02/03/2018  6:00 AM    Anti-infectives:  Anti-infectives (From admission, onward)   Start     Dose/Rate Route Frequency Ordered Stop   02/08/18 1200  ceFAZolin (ANCEF) IVPB 2g/100 mL premix     2 g 200 mL/hr over 30 Minutes Intravenous Every 8 hours 02/08/18 0955 02/10/18 1159   02/03/18 2100  vancomycin (VANCOCIN) 1,500 mg in sodium chloride 0.9 % 500 mL IVPB     1,500 mg 250 mL/hr over 120 Minutes Intravenous Every 12 hours 02/03/18 1950 02/07/18 2359   02/03/18 0522  vancomycin (VANCOCIN) 1,500 mg in sodium chloride 0.9 % 500 mL IVPB  Status:  Discontinued     1,500 mg 250 mL/hr over 120 Minutes Intravenous As needed 02/03/18 0523 02/03/18 1950   02/01/18 2100  vancomycin (VANCOCIN) 1,500 mg in sodium chloride 0.9 % 500 mL IVPB  Status:  Discontinued     1,500 mg 250 mL/hr over 120 Minutes Intravenous Every 8 hours 02/01/18 2038 02/03/18 0523   02/01/18 1800  anidulafungin (ERAXIS) 100 mg in sodium chloride 0.9 % 100 mL IVPB  Status:  Discontinued     100 mg 78 mL/hr over 100 Minutes Intravenous Every 24 hours 01/31/18 1734 02/04/18 0911   02/01/18 1500  anidulafungin (ERAXIS) 100 mg in sodium chloride 0.9 % 100 mL IVPB  Status:  Discontinued     100 mg 78 mL/hr over 100 Minutes Intravenous Every 24 hours 01/31/18 1540 01/31/18 1734  01/31/18 2000  vancomycin (VANCOCIN) 1,250 mg in sodium chloride 0.9 % 250 mL IVPB  Status:  Discontinued     1,250 mg 166.7 mL/hr over 90 Minutes Intravenous Every 8 hours 01/31/18 1734 02/01/18 2038   01/31/18 1900  vancomycin (VANCOCIN) 1,250 mg in sodium chloride 0.9 % 250 mL IVPB  Status:  Discontinued     1,250 mg 166.7 mL/hr over 90 Minutes Intravenous Every 8 hours 01/31/18 1728 01/31/18 1734   01/31/18 1545  anidulafungin (ERAXIS) 200 mg in sodium chloride 0.9 % 200 mL IVPB     200  mg 78 mL/hr over 200 Minutes Intravenous  Once 01/31/18 1540 01/31/18 2107   01/31/18 0930  vancomycin (VANCOCIN) IVPB 1000 mg/200 mL premix     1,000 mg 200 mL/hr over 60 Minutes Intravenous STAT 01/31/18 0922 01/31/18 1545   01/31/18 0800  meropenem (MERREM) 1 g in sodium chloride 0.9 % 100 mL IVPB     1 g 200 mL/hr over 30 Minutes Intravenous Every 8 hours 01/31/18 0748 02/07/18 2359   01/21/18 0600  ceFAZolin (ANCEF) 3 g in dextrose 5 % 50 mL IVPB     3 g 130 mL/hr over 30 Minutes Intravenous  Once 01/20/18 1829 01/21/18 0654   01/20/18 0600  vancomycin (VANCOCIN) 1,250 mg in sodium chloride 0.9 % 250 mL IVPB  Status:  Discontinued     1,250 mg 166.7 mL/hr over 90 Minutes Intravenous Every 12 hours 01/20/18 0153 01/20/18 0905   01/19/18 0200  vancomycin (VANCOCIN) 1,250 mg in sodium chloride 0.9 % 250 mL IVPB  Status:  Discontinued     1,250 mg 166.7 mL/hr over 90 Minutes Intravenous Every 8 hours 01/18/18 1953 01/20/18 0153   01/18/18 2000  vancomycin (VANCOCIN) 1,250 mg in sodium chloride 0.9 % 250 mL IVPB     1,250 mg 166.7 mL/hr over 90 Minutes Intravenous  Once 01/18/18 1953 01/18/18 2246   01/18/18 2000  ceFEPIme (MAXIPIME) 2 g in sodium chloride 0.9 % 100 mL IVPB  Status:  Discontinued     2 g 200 mL/hr over 30 Minutes Intravenous Every 8 hours 01/18/18 1953 01/20/18 0905   01/12/18 0900  vancomycin (VANCOCIN) IVPB 1000 mg/200 mL premix  Status:  Discontinued     1,000 mg 200 mL/hr over 60 Minutes Intravenous Every 24 hours 01/11/18 1329 01/13/18 1548   01/10/18 1000  ceFEPIme (MAXIPIME) 2 g in sodium chloride 0.9 % 100 mL IVPB     2 g 200 mL/hr over 30 Minutes Intravenous Every 12 hours 01/10/18 0919 01/13/18 2129   01/09/18 2200  vancomycin (VANCOCIN) IVPB 750 mg/150 ml premix  Status:  Discontinued     750 mg 150 mL/hr over 60 Minutes Intravenous Every 12 hours 01/09/18 1952 01/11/18 1329   01/08/18 1503  Ampicillin-Sulbactam (UNASYN) 3 g in sodium chloride 0.9 % 100 mL  IVPB  Status:  Discontinued     3 g 200 mL/hr over 30 Minutes Intravenous Every 6 hours 01/08/18 0931 01/08/18 1024   01/08/18 1230  ceFEPIme (MAXIPIME) 2 g in sodium chloride 0.9 % 100 mL IVPB  Status:  Discontinued     2 g 200 mL/hr over 30 Minutes Intravenous Every 12 hours 01/08/18 1151 01/10/18 0919   01/08/18 1100  metroNIDAZOLE (FLAGYL) IVPB 500 mg  Status:  Discontinued     500 mg 100 mL/hr over 60 Minutes Intravenous Every 8 hours 01/08/18 1024 01/13/18 1547   01/08/18 0800  Ampicillin-Sulbactam (UNASYN) 3 g in sodium chloride  0.9 % 100 mL IVPB  Status:  Discontinued     3 g 200 mL/hr over 30 Minutes Intravenous Every 12 hours 01/07/18 1906 01/08/18 0931   01/08/18 0600  vancomycin (VANCOCIN) IVPB 1000 mg/200 mL premix  Status:  Discontinued     1,000 mg 200 mL/hr over 60 Minutes Intravenous Every 12 hours 01/08/18 0545 01/09/18 1952   01/07/18 2100  ceFEPIme (MAXIPIME) 2 g in sodium chloride 0.9 % 100 mL IVPB  Status:  Discontinued     2 g 200 mL/hr over 30 Minutes Intravenous Every 24 hours 01/06/18 2349 01/07/18 1008   01/07/18 1800  Ampicillin-Sulbactam (UNASYN) 3 g in sodium chloride 0.9 % 100 mL IVPB  Status:  Discontinued     3 g 200 mL/hr over 30 Minutes Intravenous Every 6 hours 01/07/18 1436 01/07/18 1906   01/07/18 1015  Ampicillin-Sulbactam (UNASYN) 3 g in sodium chloride 0.9 % 100 mL IVPB  Status:  Discontinued     3 g 200 mL/hr over 30 Minutes Intravenous Every 12 hours 01/07/18 1008 01/07/18 1436   01/07/18 0600  vancomycin (VANCOCIN) 1,250 mg in sodium chloride 0.9 % 250 mL IVPB  Status:  Discontinued     1,250 mg 166.7 mL/hr over 90 Minutes Intravenous Every 24 hours 01/06/18 2349 01/07/18 1917   01/06/18 2100  ceFEPIme (MAXIPIME) 2 g in sodium chloride 0.9 % 100 mL IVPB     2 g 200 mL/hr over 30 Minutes Intravenous  Once 01/06/18 2056 01/06/18 2320   01/06/18 2100  vancomycin (VANCOCIN) IVPB 1000 mg/200 mL premix     1,000 mg 200 mL/hr over 60 Minutes  Intravenous  Once 01/06/18 2056 01/07/18 0024      Microbiology: Results for orders placed or performed during the hospital encounter of 01/06/18  Blood Culture (routine x 2)     Status: None   Collection Time: 01/06/18  7:44 PM  Result Value Ref Range Status   Specimen Description BLOOD BLOOD LEFT WRIST  Final   Special Requests   Final    BOTTLES DRAWN AEROBIC AND ANAEROBIC Blood Culture adequate volume   Culture   Final    NO GROWTH 5 DAYS Performed at [facility], [address] [place], [place] [address]    Report Status 01/11/2018 FINAL  Final  Urine culture     Status: Abnormal   Collection Time: 01/06/18  7:44 PM  Result Value Ref Range Status   Specimen Description   Final    URINE, RANDOM Performed at [facility], [address] [place], [place] [address]    Special Requests   Final    NONE Performed at [facility], [address] [place], [place] [address]    Culture (A)  Final    10,000 COLONIES/mL STAPHYLOCOCCUS SPECIES (COAGULASE NEGATIVE) CALL MICROBIOLOGY LAB IF SENSITIVITIES ARE REQUIRED. Performed at [facility], [address], [place], [place] [address]    Report Status 01/08/2018 FINAL  Final  MRSA PCR Screening     Status: Abnormal   Collection Time: 01/06/18 11:51 PM  Result Value Ref Range Status   MRSA by PCR POSITIVE (A) NEGATIVE Final    Comment:        The GeneXpert MRSA Assay (FDA approved for NASAL specimens only), is one component of a comprehensive MRSA colonization surveillance program. It is not intended to diagnose MRSA infection nor to guide or monitor treatment for MRSA infections. RESULT CALLED TO, READ BACK BY AND VERIFIED  WITH: Reynolds Bowl 01/07/18 @ 0121  MLK   Performed at [facility], [address] [place], [place] [address]   Blood Culture (routine x 2)     Status: None   Collection Time: 01/07/18  7:19 AM  Result Value Ref Range Status   Specimen Description BLOOD  RIGHT HAND  Final   Special Requests   Final    BOTTLES DRAWN AEROBIC AND ANAEROBIC Blood Culture results may not be optimal due to an inadequate volume of blood received in culture bottles   Culture   Final    NO GROWTH 5 DAYS Performed at [facility], [address] [place], [place] [address]    Report Status 01/12/2018 FINAL  Final  Culture, respiratory (NON-Expectorated)     Status: None   Collection Time: 01/07/18 12:28 PM  Result Value Ref Range Status   Specimen Description   Final    TRACHEAL ASPIRATE Performed at [facility], [address] [place], [place] [address]    Special Requests   Final    Normal Performed at [facility], [address] [place], [place] [address]    Gram Stain   Final    MODERATE WBC PRESENT,BOTH PMN AND MONONUCLEAR RARE GRAM POSITIVE COCCI RARE YEAST    Culture   Final    Consistent with normal respiratory flora. Performed at [facility], [address] [place], [place] [address]    Report Status 01/09/2018 FINAL  Final  Culture, expectorated sputum-assessment     Status: None   Collection Time: 01/18/18  5:07 PM  Result Value Ref Range Status   Specimen Description ENDOTRACHEAL  Final   Special Requests NONE  Final   Sputum evaluation   Final    THIS SPECIMEN IS ACCEPTABLE FOR SPUTUM CULTURE Performed at [facility], [address], [place], [place] [address]    Report Status 01/18/2018 FINAL  Final  Culture, respiratory (NON-Expectorated)     Status: None   Collection Time: 01/18/18  5:07 PM  Result Value Ref Range Status   Specimen Description   Final    ENDOTRACHEAL Performed at [facility], [address] [place], [place] [address]    Special Requests   Final    NONE Reflexed from [phone] Performed at [facility], [address] [place], [place] [address]    Gram Stain   Final    MODERATE WBC PRESENT,BOTH PMN AND MONONUCLEAR NO SQUAMOUS EPITHELIAL  CELLS SEEN RARE BUDDING YEAST SEEN    Culture   Final    RARE Consistent with normal respiratory flora. Performed at [facility], [address] [place], [place] [address]    Report Status 01/21/2018 FINAL  Final  MRSA PCR Screening     Status: Abnormal   Collection Time: 01/18/18  8:26 PM  Result Value Ref Range Status   MRSA by PCR POSITIVE (A) NEGATIVE Final    Comment:        The GeneXpert MRSA Assay (FDA approved for NASAL specimens only), is one component of a comprehensive MRSA colonization surveillance program. It is not intended to diagnose MRSA infection nor to guide or monitor treatment for MRSA infections. RESULT CALLED TO, READ BACK BY AND VERIFIED WITH: ANGELA BREHUN AT 2200 ON 01/18/18 RWW Performed at [facility], [address] [place], [place] [address]   CULTURE, BLOOD (ROUTINE X 2) w Reflex to ID Panel     Status: Abnormal  Collection Time: 01/31/18  8:17 AM  Result Value Ref Range Status   Specimen Description   Final    BLOOD LEFT HAND Performed at [facility], [address] [place], [place] [address]    Special Requests   Final    BOTTLES DRAWN AEROBIC AND ANAEROBIC Blood Culture results may not be optimal due to an excessive volume of blood received in culture bottles Performed at [place] [facility], [address] [place], [place] [address]    Culture  Setup Time   Final    GRAM NEGATIVE RODS AEROBIC BOTTLE ONLY CRITICAL RESULT CALLED TO, READ BACK BY AND VERIFIED WITH: DAVID BESANTI ON 02/05/18 AT 0228 JAG Performed at [facility], [address], [place], [place] [address]    Culture KLEBSIELLA PNEUMONIAE (A)  Final   Report Status 02/07/2018 FINAL  Final   Organism ID, Bacteria KLEBSIELLA PNEUMONIAE  Final      Susceptibility   Klebsiella pneumoniae - MIC*    AMPICILLIN >=32 RESISTANT Resistant     CEFAZOLIN <=4 SENSITIVE Sensitive     CEFEPIME <=1 SENSITIVE Sensitive     CEFTAZIDIME <=1 SENSITIVE  Sensitive     CEFTRIAXONE <=1 SENSITIVE Sensitive     CIPROFLOXACIN <=0.25 SENSITIVE Sensitive     GENTAMICIN <=1 SENSITIVE Sensitive     IMIPENEM <=0.25 SENSITIVE Sensitive     TRIMETH/SULFA <=20 SENSITIVE Sensitive     AMPICILLIN/SULBACTAM >=32 RESISTANT Resistant     PIP/TAZO 16 SENSITIVE Sensitive     Extended ESBL NEGATIVE Sensitive     * KLEBSIELLA PNEUMONIAE  Blood Culture ID Panel (Reflexed)     Status: Abnormal   Collection Time: 01/31/18  8:17 AM  Result Value Ref Range Status   Enterococcus species NOT DETECTED NOT DETECTED Final   Listeria monocytogenes NOT DETECTED NOT DETECTED Final   Staphylococcus species NOT DETECTED NOT DETECTED Final   Staphylococcus aureus NOT DETECTED NOT DETECTED Final   Streptococcus species NOT DETECTED NOT DETECTED Final   Streptococcus agalactiae NOT DETECTED NOT DETECTED Final   Streptococcus pneumoniae NOT DETECTED NOT DETECTED Final   Streptococcus pyogenes NOT DETECTED NOT DETECTED Final   Acinetobacter baumannii NOT DETECTED NOT DETECTED Final   Enterobacteriaceae species DETECTED (A) NOT DETECTED Final    Comment: Enterobacteriaceae represent a large family of gram-negative bacteria, not a single organism. CRITICAL RESULT CALLED TO, READ BACK BY AND VERIFIED WITH: DAVID BESANTI ON 02/05/18 AT 0228 JAG    Enterobacter cloacae complex NOT DETECTED NOT DETECTED Final   Escherichia coli NOT DETECTED NOT DETECTED Final   Klebsiella oxytoca NOT DETECTED NOT DETECTED Final   Klebsiella pneumoniae DETECTED (A) NOT DETECTED Final    Comment: CRITICAL RESULT CALLED TO, READ BACK BY AND VERIFIED WITH: DAVID BESANTI ON 02/05/18 AT 0228 JAG    Proteus species NOT DETECTED NOT DETECTED Final   Serratia marcescens NOT DETECTED NOT DETECTED Final   Carbapenem resistance NOT DETECTED NOT DETECTED Final   Haemophilus influenzae NOT DETECTED NOT DETECTED Final   Neisseria meningitidis NOT DETECTED NOT DETECTED Final   Pseudomonas aeruginosa NOT  DETECTED NOT DETECTED Final   Candida albicans NOT DETECTED NOT DETECTED Final   Candida glabrata NOT DETECTED NOT DETECTED Final   Candida krusei NOT DETECTED NOT DETECTED Final   Candida parapsilosis NOT DETECTED NOT DETECTED Final   Candida tropicalis NOT DETECTED NOT DETECTED Final    Comment: Performed at [facility], [address], [place], [place] [address]  Urine Culture     Status: None   Collection Time: 01/31/18  8:30 AM  Result Value Ref Range Status   Specimen Description   Final    URINE, RANDOM Performed at [facility], [address], [place], [place] [address]    Special Requests   Final    NONE Performed at [facility], [address], [place], [place] [address]    Culture   Final    NO GROWTH Performed at [facility], [address] [place], [place], [place] [address]    Report Status 02/01/2018 FINAL  Final  CULTURE, BLOOD (ROUTINE X 2) w Reflex to ID Panel     Status: Abnormal   Collection Time: 01/31/18  8:53 AM  Result Value Ref Range Status   Specimen Description   Final    BLOOD BLOOD RIGHT HAND Performed at [facility], [address], [place], [place] [address]    Special Requests   Final    BOTTLES DRAWN AEROBIC AND ANAEROBIC Blood Culture adequate volume Performed at [facility], [address] [place], [place] [address]    Culture  Setup Time   Final    GRAM NEGATIVE RODS AEROBIC BOTTLE ONLY CRITICAL RESULT CALLED TO, READ BACK BY AND VERIFIED WITH: DAVID BESANTI AT 0340 ON 02/05/18 [facility]. Performed at [facility], [address] [place], [place] [address]    Culture (A)  Final    KLEBSIELLA PNEUMONIAE SUSCEPTIBILITIES PERFORMED ON PREVIOUS CULTURE WITHIN THE LAST 5 DAYS. Performed at [facility], [address] [place], [place] [address]    Report Status 02/07/2018 FINAL  Final  Culture, respiratory (NON-Expectorated)     Status: None   Collection Time: 01/31/18 10:47  AM  Result Value Ref Range Status   Specimen Description   Final    TRACHEAL ASPIRATE Performed at [facility], [address] [place], [place] [address]    Special Requests   Final    NONE Performed at [facility], [address] [place], [place] [address]    Gram Stain   Final    MODERATE WBC PRESENT, PREDOMINANTLY PMN MODERATE GRAM POSITIVE COCCI MODERATE GRAM POSITIVE RODS    Culture   Final    Consistent with normal respiratory flora. Performed at [facility], [address] [place], [place] [address]    Report Status 02/02/2018 FINAL  Final  CULTURE, BLOOD (ROUTINE X 2) w Reflex to ID Panel     Status: None   Collection Time: 02/04/18  5:41 PM  Result Value Ref Range Status   Specimen Description BLOOD BLOOD LEFT HAND  Final   Special Requests   Final    BOTTLES DRAWN AEROBIC AND ANAEROBIC Blood Culture adequate volume   Culture   Final    NO GROWTH 5 DAYS Performed at [facility], [address] [place], [place] [address]    Report Status 02/09/2018 FINAL  Final  CULTURE, BLOOD (ROUTINE X 2) w Reflex to ID Panel     Status: None (Preliminary result)   Collection Time: 02/04/18  8:50 PM  Result Value Ref Range Status   Specimen Description BLOOD LEFT HAND  Final   Special Requests   Final    BOTTLES DRAWN AEROBIC AND ANAEROBIC Blood Culture adequate volume   Culture   Final    NO GROWTH 4 DAYS Performed at [facility], [address], [place], [place] [address]  Report Status PENDING  Incomplete  MRSA PCR Screening     Status: None   Collection Time: 02/05/18  9:38 AM  Result Value Ref Range Status   MRSA by PCR NEGATIVE NEGATIVE Final    Comment:        The GeneXpert MRSA Assay (FDA approved for NASAL specimens only), is one component of a comprehensive MRSA colonization surveillance program. It is not intended to diagnose MRSA infection nor to guide or monitor treatment for MRSA  infections. Performed at [facility], [address] [place], [place] [address]     Best Practice/Protocols:    Events:  Studies: Dg Abd 1 View  Result Date: 01/27/2018 CLINICAL DATA:  Ileus EXAM: ABDOMEN - 1 VIEW COMPARISON:  01/24/2018 FINDINGS: Diffuse gaseous distention of bowel again noted compatible with ileus, unchanged. No free air or organomegaly. No acute bony abnormality. IMPRESSION: Stable ileus pattern. Electronically Signed   By: Charlett Nose M.D.   On: 01/27/2018 07:51   Dg Abd 1 View  Result Date: 01/24/2018 CLINICAL DATA:  Constipation EXAM: ABDOMEN - 1 VIEW COMPARISON:  01/13/2018 FINDINGS: A gastrostomy tube is demonstrated over the stomach. There is mild gaseous distention of the stomach, colon, and small bowel. Prominent stool in the rectum. Changes may be due to adynamic ileus or pseudo-obstruction from impacted rectal stool. No radiopaque stones. Infiltration or atelectasis in the lung bases. IMPRESSION: Mildly gas distended stomach, small bowel, and colon with prominent stool in the rectum suggesting adynamic ileus or pseudo-obstruction due to stool impaction. Electronically Signed   By: Burman Nieves M.D.   On: 01/24/2018 23:47   Dg Abd 1 View  Result Date: 01/13/2018 CLINICAL DATA:  Orogastric tube placement EXAM: ABDOMEN - 1 VIEW COMPARISON:  CT 01/06/2018 FINDINGS: Consolidation at the left lung base. Esophageal tube tip projects over the proximal stomach. Air-filled dilated bowel in the upper abdomen. IMPRESSION: Esophageal tube tip overlies the proximal stomach. There is consolidation at the left lung base. Electronically Signed   By: Jasmine Pang M.D.   On: 01/13/2018 21:32   Dg Chest Port 1 View  Result Date: 02/08/2018 CLINICAL DATA:  Acute respiratory failure EXAM: PORTABLE CHEST 1 VIEW COMPARISON:  Feb 06, 2018. FINDINGS: The heart size and mediastinal contours are stable. The heart size is enlarged. Tracheostomy tube is identified in good  position. Right central venous line is identified with distal tip in the superior vena cava. Diffuse opacity is identified throughout bilateral lungs. There are probable bilateral pleural effusions. The visualized skeletal structures are stable. IMPRESSION: Diffuse airspace disease identified in both lungs unchanged compared to prior exam. Probable small bilateral pleural effusions. Electronically Signed   By: Sherian Rein M.D.   On: 02/08/2018 07:14   Dg Chest Port 1 View  Result Date: 02/06/2018 CLINICAL DATA:  Intubated patient with sepsis, pneumonia and possible aspiration. EXAM: PORTABLE CHEST 1 VIEW COMPARISON:  Single-view of the chest 02/05/2018 and 02/04/2018. FINDINGS: Tracheostomy tube and right PICC remain in place. Diffuse bilateral airspace disease is unchanged. No pneumothorax or pleural effusion. IMPRESSION: No change in diffuse bilateral airspace disease. Electronically Signed   By: Drusilla Kanner M.D.   On: 02/06/2018 11:04   Dg Chest Port 1 View  Result Date: 02/05/2018 CLINICAL DATA:  57 year old male with acute respiratory failure. EXAM: PORTABLE CHEST 1 VIEW COMPARISON:  Chest radiograph dated 02/04/2018 FINDINGS: Right-sided PICC in similar position. Tracheostomy above the carina. Diffuse airspace opacity throughout the lungs with no significant interval change compared  to the prior radiograph. No pneumothorax. The cardiac borders are silhouetted. No acute osseous pathology. IMPRESSION: No significant interval change. Electronically Signed   By: Elgie Collard M.D.   On: 02/05/2018 06:22   Dg Chest Port 1 View  Result Date: 02/04/2018 CLINICAL DATA:  Acute respiratory failure, pulmonary edema, sepsis, hypertension, diabetes, current smoker. EXAM: PORTABLE CHEST 1 VIEW COMPARISON:  Portable chest x-ray of Feb 03, 2018 FINDINGS: Diffuse airspace opacities have worsened bilaterally. The cardiac silhouette is mildly enlarged. The pulmonary vascularity is indistinct. There is  calcification in the wall of the aortic arch. There may be pleural fluid layering posteriorly on the left. The tracheostomy tube tip projects between the clavicular heads. The right sided PICC line tip projects over the midportion of the SVC. IMPRESSION: Worsening airspace opacities bilaterally consistent with progressive pneumonia or pulmonary edema. Thoracic aortic atherosclerosis. Electronically Signed   By: David  [place] M.D.   On: 02/04/2018 10:28   Dg Chest Port 1 View  Result Date: 02/03/2018 CLINICAL DATA:  Follow-up pneumonia EXAM: PORTABLE CHEST 1 VIEW COMPARISON:  02/02/2018 FINDINGS: Cardiac shadow is stable. Right-sided PICC line and tracheostomy tube are noted and stable. Diffuse bilateral infiltrates are again seen and stable. No bony abnormality is noted. IMPRESSION: Stable appearance when compared with the previous exam. Electronically Signed   By: Alcide Clever M.D.   On: 02/03/2018 07:15   Dg Chest Port 1 View  Result Date: 02/02/2018 CLINICAL DATA:  Pneumonia EXAM: PORTABLE CHEST 1 VIEW COMPARISON:  Chest radiograph from one day prior. FINDINGS: Tracheostomy tube tip overlies the tracheal air column at the thoracic inlet. Right PICC terminates at the cavoatrial junction. Stable cardiomediastinal silhouette with normal heart size. No pneumothorax. No pleural effusion. Severe patchy opacity throughout both lungs is not appreciably changed. IMPRESSION: 1. Stable support structures as detailed. 2. Stable severe diffuse patchy lung opacities suggesting multilobar pneumonia and/or ARDS. Electronically Signed   By: Delbert Phenix M.D.   On: 02/02/2018 07:14   Dg Chest Port 1 View  Result Date: 02/01/2018 CLINICAL DATA:  Pneumonia EXAM: PORTABLE CHEST 1 VIEW COMPARISON:  Chest radiograph from one day prior. FINDINGS: Tracheostomy tube tip overlies the tracheal air column at the thoracic inlet. Right PICC terminates at the cavoatrial junction. Stable cardiomediastinal silhouette with normal heart  size. No pneumothorax. No pleural effusion. Hazy airspace opacities throughout both lungs are slightly improved. IMPRESSION: Hazy airspace opacities throughout both lungs, slightly improved. Electronically Signed   By: Delbert Phenix M.D.   On: 02/01/2018 09:03   Dg Chest Port 1 View  Result Date: 01/31/2018 CLINICAL DATA:  PICC line placement. EXAM: PORTABLE CHEST 1 VIEW COMPARISON:  01/31/2018 and 01/30/2018. FINDINGS: 1225 hours. Right arm PICC extends to the level of the superior cavoatrial junction. Tracheostomy appears unchanged. There are stable diffuse bilateral pulmonary opacities. No pneumothorax or significant pleural effusion. The heart size and mediastinal contours are grossly stable, partially obscured by the airspace opacities. IMPRESSION: PICC line terminates at the level of the superior cavoatrial junction. Persistent bilateral airspace opacities. Electronically Signed   By: Carey Bullocks M.D.   On: 01/31/2018 12:52   Dg Chest Port 1 View  Result Date: 01/31/2018 CLINICAL DATA:  Congestive heart failure. EXAM: PORTABLE CHEST 1 VIEW COMPARISON:  Radiograph of Jan 30, 2018. FINDINGS: Stable cardiomediastinal silhouette. Tracheostomy tube and right sided PICC line are unchanged in position. Hypoinflation of the lungs is noted. Stable bilateral lung opacities are noted concerning for edema or pneumonia. No pneumothorax or  significant pleural effusion is noted. Bony thorax is unremarkable. IMPRESSION: Stable support apparatus. Hypoinflation of the lungs. Stable bilateral lung opacities concerning for edema or pneumonia. Electronically Signed   By: Lupita Raider, M.D.   On: 01/31/2018 07:13   Dg Chest Port 1 View  Result Date: 01/30/2018 CLINICAL DATA:  Congestive heart failure EXAM: PORTABLE CHEST 1 VIEW COMPARISON:  Jan 29, 2018 FINDINGS: Tracheostomy catheter tip is 5.6 cm above the carina. Central catheter tip is in the superior vena cava. No pneumothorax. There remains cardiomegaly with  pulmonary venous hypertension. There is patchy interstitial and alveolar opacity, likely edema. No adenopathy. No bone lesions. IMPRESSION: Tube and catheter positions as described without pneumothorax. Pulmonary vascular congestion with interstitial and alveolar edema, stable from 1 day prior. No new opacity evident. Electronically Signed   By: [name].   On: 01/30/2018 09:07   Dg Chest Port 1 View  Result Date: 01/29/2018 CLINICAL DATA:  Shortness of breath EXAM: PORTABLE CHEST 1 VIEW COMPARISON:  January 28, 2018 FINDINGS: Tracheostomy catheter tip is 5.0 cm above the carina. Central catheter tip is in the superior vena cava. No pneumothorax. There is diffuse airspace opacity throughout the lungs bilaterally, similar to 1 day prior. There are small pleural effusions bilaterally. No new opacity evident. Heart is enlarged with pulmonary venous hypertension. No adenopathy appreciable. No bone lesions. IMPRESSION: Tube and catheter positions as described without pneumothorax. Cardiomegaly with pulmonary venous hypertension, consistent with pulmonary vascular congestion. Small pleural effusions bilaterally. Widespread airspace opacity bilaterally may represent alveolar edema or pneumonia. Both entities may exist concurrently. A degree of underlying ARDS is also possible. The overall appearance is essentially stable compared to 1 day prior. Electronically Signed   By: [name].   On: 01/29/2018 07:45   Dg Chest Port 1 View  Result Date: 01/28/2018 CLINICAL DATA:  Pneumonia EXAM: PORTABLE CHEST 1 VIEW COMPARISON:  01/23/2018 FINDINGS: Tracheostomy and right PICC line remain in place, unchanged. Cardiomegaly. Vascular congestion and diffuse bilateral airspace opacities are again noted, unchanged. Low lung volumes. No visible effusions or acute bony abnormality. IMPRESSION: Cardiomegaly with vascular congestion and diffuse bilateral airspace disease which could reflect edema or  infection. No change. Electronically Signed   By: Charlett Nose M.D.   On: 01/28/2018 09:21   Dg Chest Port 1 View  Result Date: 01/23/2018 CLINICAL DATA:  Tracheostomy placement EXAM: PORTABLE CHEST 1 VIEW COMPARISON:  January 21, 2018 FINDINGS: Tracheostomy catheter tip is 4.1 cm above the carina. Central catheter tip is at the cavoatrial junction. No pneumothorax. There is persistent moderate interstitial and patchy alveolar edema with small pleural effusions bilaterally. Heart is mildly enlarged with pulmonary venous hypertension. No adenopathy. No bone lesions. IMPRESSION: Tube and catheter positions as described without pneumothorax. Evidence of a degree of congestive heart failure, similar to most recent prior study. Electronically Signed   By: [name].   On: 01/23/2018 12:06   Dg Chest Port 1 View  Result Date: 01/21/2018 CLINICAL DATA:  Hypoxia EXAM: PORTABLE CHEST 1 VIEW COMPARISON:  January 19, 2018 FINDINGS: Endotracheal tube tip is 3.7 cm above the carina. Nasogastric tube tip and side port are below the diaphragm. Central catheter tip is in the superior vena cava. No pneumothorax. There is moderate interstitial and patchy alveolar opacities, slightly increased. There is a small left pleural effusion. Heart is upper normal in size with pulmonary venous hypertension. No adenopathy. No bone lesions. IMPRESSION: Tube and catheter  positions as described without pneumothorax. Increase interstitial and patchy alveolar pulmonary edema, likely indicative of a degree of congestive heart failure. Small left pleural effusion. Underlying pulmonary vascular congestion. Electronically Signed   By: [name].   On: 01/21/2018 07:04   Dg Chest Port 1 View  Result Date: 01/19/2018 CLINICAL DATA:  Acute respiratory failure. EXAM: PORTABLE CHEST 1 VIEW COMPARISON:  01/18/2018 FINDINGS: Endotracheal tube terminates 4 cm above the carina. Enteric tube can be followed to the GE  junction but is not well visualized beyond this due to under penetration in the upper abdomen. Right PICC terminates at the cavoatrial junction. The cardiomediastinal silhouette is unchanged allowing for differences in patient rotation. Interstitial and patchy airspace opacities are again seen bilaterally with mild improvement in the right base and without significant interval change on the left. No large pleural effusion or pneumothorax is identified. IMPRESSION: Bilateral interstitial and airspace opacities, slightly improved on the right and likely reflecting pneumonia. Electronically Signed   By: Sebastian Ache M.D.   On: 01/19/2018 07:49   Dg Chest Port 1 View  Result Date: 01/18/2018 CLINICAL DATA:  Oxygen desaturation EXAM: PORTABLE CHEST 1 VIEW COMPARISON:  01/18/2018 FINDINGS: Endotracheal tube, right PICC line and NG tube remain in place, unchanged. Bilateral interstitial and alveolar opacities are again noted, unchanged. Small bilateral effusions suspected. Heart is upper limits normal in size. IMPRESSION: Bilateral interstitial and airspace opacities with small effusions. No real change. Electronically Signed   By: Charlett Nose M.D.   On: 01/18/2018 16:39   Dg Chest Port 1 View  Result Date: 01/18/2018 CLINICAL DATA:  Acute respiratory failure. EXAM: PORTABLE CHEST 1 VIEW COMPARISON:  01/16/2018 and CT chest 01/06/2018. FINDINGS: Endotracheal tube terminates 4.9 cm above the carina. Nasogastric tube is followed into the stomach. Right PICC tip projects over the SVC RA junction. Heart size within normal limits. Lungs are somewhat low in volume with diffuse mixed interstitial and airspace opacification, possibly mildly worsened from 01/16/2018. There may be right middle lobe and left lower lobe consolidation. No definite pleural fluid. IMPRESSION: Mixed interstitial and airspace opacification, possibly slightly worsened from 01/16/2018, with possible consolidation in the right middle and left lower  lobes. Findings are indicative of persistent pneumonia, especially when compared with 01/06/2018. Electronically Signed   By: Leanna Battles M.D.   On: 01/18/2018 08:12   Dg Chest Port 1 View  Result Date: 01/16/2018 CLINICAL DATA:  Hypoxia and acute respiratory failure. EXAM: PORTABLE CHEST 1 VIEW COMPARISON:  None. FINDINGS: Cardiomediastinal silhouette is unchanged. An endotracheal tube with tip 3.2 cm above the carina, NG tube entering the stomach, RIGHT PICC line with tip overlying the SUPERIOR cavoatrial junction are again noted. Interstitial/airspace opacities are again noted with slight improved aeration on the LEFT. Slightly increased RIGHT UPPER lobe atelectasis noted. There is no evidence of pneumothorax. IMPRESSION: Slightly increased RIGHT UPPER lobe atelectasis and slightly improved LEFT lung aeration. Otherwise no significant change. Electronically Signed   By: Harmon Pier M.D.   On: 01/16/2018 20:49   Dg Chest Port 1 View  Result Date: 01/13/2018 CLINICAL DATA:  Respiratory failure. EXAM: PORTABLE CHEST 1 VIEW COMPARISON:  One-view chest x-ray 01/12/2018. FINDINGS: Heart size is exaggerate by low lung volumes. Endotracheal tube is stable. Left greater than right interstitial and airspace disease is similar the prior exam. Overall lung volumes are slightly improved. IMPRESSION: 1. Stable appearance of left greater than right interstitial and airspace disease, likely a combination of edema and possible  infection. 2. Support apparatus is stable. 3. Slight improvement in lung volumes. Electronically Signed   By: Marin Roberts M.D.   On: 01/13/2018 07:49   Dg Chest Port 1 View  Result Date: 01/12/2018 CLINICAL DATA:  Acute respiratory failure with hypoxia. Severe sepsis with septic shock. On ventilator. EXAM: PORTABLE CHEST 1 VIEW COMPARISON:  01/11/2018 FINDINGS: Endotracheal tube and nasogastric tube remain in appropriate position. Heart size remains within normal limits. Low lung  volumes again seen with diffuse interstitial infiltrates. No evidence of focal consolidation or significant pleural effusion. IMPRESSION: Stable low lung volumes with diffuse interstitial infiltrates/edema. Electronically Signed   By: Myles Rosenthal M.D.   On: 01/12/2018 07:37   Dg Chest Port 1 View  Result Date: 01/11/2018 CLINICAL DATA:  Respiratory failure EXAM: PORTABLE CHEST 1 VIEW COMPARISON:  January 10, 2018 FINDINGS: The ETT terminates 2.8 cm above the carina. No pneumothorax. The cardiomediastinal silhouette is stable. Pulmonary edema has decreased but persists. No other changes. IMPRESSION: 1. Stable support apparatus. 2. Persistent but decreased pulmonary edema. 3. No other change. Electronically Signed   By: Gerome Sam III M.D   On: 01/11/2018 07:01   [place] Ekg Site Rite  Result Date: 01/13/2018 If Site Rite image not attached, placement could not be confirmed due to current cardiac rhythm.   Consults: Treatment Team:  Marcina Millard, MD Mick Sell, MD   Subjective:    Overnight patient Did not have any events. Still requiring high FiO2 at 100 and 14 of PEEP.received 1 unit of packed red blood cells yesterday, had to be stopped midway secondary to fever, blood bank notified, repeat hemoglobin is 7.0 this morning   Objective:  Vital signs for last 24 hours: Temp:  [98.5 F (36.9 C)-100.9 F (38.3 C)] 98.9 F (37.2 C) (05/12 0400) Pulse Rate:  [63-77] 64 (05/12 0600) Resp:  [15-28] 25 (05/12 0600) BP: (93-131)/(44-100) 100/64 (05/12 0600) SpO2:  [89 %-100 %] 97 % (05/12 0719) FiO2 (%):  [90 %-100 %] 100 % (05/12 0719)  Hemodynamic parameters for last 24 hours:    Intake/Output from previous day: 05/11 0701 - 05/12 0700 In: 3430.5 [I.V.:775.5; Blood:430; NG/GT:2025; IV Piggyback:200] Out: 2900 [Urine:2900]  Intake/Output this shift: No intake/output data recorded.  Vent settings for last 24 hours: Vent Mode: PRVC FiO2 (%):  [90 %-100 %] 100 % Set  Rate:  [14 bmp] 14 bmp Vt Set:  [500 mL] 500 mL PEEP:  [5 cmH20-10 cmH20] 10 cmH20 Plateau Pressure:  [18 cmH20-21 cmH20] 18 cmH20  Physical Exam:  Patient is on mechanical ventilation through tracheostomy. Appears to track and minimally respond Vital signs: Please see the above listed vital signs HEENT: Tracheostomy in place with mechanical ventilation, trachea is midline, mild increased work of breathing, no noted jugular venous distention Cardiovascular: Regular rate and rhythm,  Pulmonary: Coarse expiratory rhonchi noted, inspiratory crackles appreciated bilateral Abdominal: PEG tube noted to be in place, mild distention, bowel sounds appreciated Extremity: Edema noted Neurologic: Patient moves extremities, appears to track  Assessment/Plan:   Patient 56 year old gentleman with traumatic brain injury with acute respiratory failure, severe bilateral pneumonia aspiration, sepsis. On Roxanol and and Lasix.Still requiring high PEEP and FiO2. Chest x-ray reveals diffuse bilateral airspace disease.   History of atrial fibrillation. On amiodarone And systemic anticoagulation  History of dilated cardiomyopathy with ejection fraction 20%  Anemia.received 1 unit of packed RBCs, had to be stopped halfway secondary to possible transfusion reaction, repeat hemoglobin was 7.0  Leukocytosis. White count is  12.2   Marya Lowden 02/09/2018  *Care during the described time interval was provided by me and/or other providers on the critical care team.  I have reviewed this patient's available data, including medical history, events of note, physical examination and test results as part of my evaluation. Patient ID: Jayvion Stefanski, male   DOB: 1961-09-02, 57 y.o.   MRN: [id number] Patient ID: Jlen Wintle, male   DOB: 03-21-1961, 57 y.o.   MRN: [id number] Patient ID: Skye Rodarte, male   DOB: [birth year], 57 y.o.   MRN: [id number] Patient ID: Lieutenant Abarca, male   DOB: Apr 07, 1961, 57 y.o.   MRN:  [id number] Patient ID: Paxten Appelt, male   DOB: 24-May-1961, 57 y.o.   MRN: [id number] Patient ID: Biran Mayberry, male   DOB: 11/05/1960, 57 y.o.   MRN: [id number]

## 2018-02-09 NOTE — Progress Notes (Signed)
Patient ID: Tommy Mathews, male   DOB: September 17, 1961, 57 y.o.   MRN: [id number]  [facility] PROGRESS NOTE  Shawna Kiener IPJ:[id number] DOB: May 28, 1961 DOA: 01/06/2018 PCP: Isaias Cowman, MD  HPI/Subjective: Patient follows a few simple commands.  Objective: Vitals:   02/09/18 1300 02/09/18 1534  BP: (!) 99/55   Pulse: 65   Resp: 20   Temp:    SpO2: (!) 86% 90%    Filed Weights   01/19/18 0347 01/20/18 1200 01/26/18 0418  Weight: (!) 136.7 kg (301 lb 5.9 oz) (!) 139.5 kg (307 lb 8.7 oz) 133.2 kg (293 lb 10.4 oz)    ROS: Review of Systems  Unable to perform ROS: Acuity of condition   Exam: Physical Exam  Constitutional: He appears lethargic.  HENT:  Nose: No mucosal edema.  Mouth/Throat: No oropharyngeal exudate or posterior oropharyngeal edema.  Eyes: Pupils are equal, round, and reactive to light. Conjunctivae, EOM and lids are normal.  Neck: No JVD present. Carotid bruit is not present. No edema present. No thyroid mass and no thyromegaly present.  Cardiovascular: S1 normal and S2 normal. Exam reveals no gallop.  No murmur heard. Respiratory: No respiratory distress. He has decreased breath sounds in the right upper field, the right middle field, the right lower field, the left upper field, the left middle field and the left lower field. He has no wheezes. He has rhonchi in the right upper field, the right middle field, the right lower field, the left upper field, the left middle field and the left lower field. He has no rales.  GI: Soft. Bowel sounds are normal. He exhibits distension. There is no tenderness.  Musculoskeletal:       Right ankle: He exhibits swelling.       Left ankle: He exhibits swelling.  Lymphadenopathy:    He has no cervical adenopathy.  Neurological: He appears lethargic.  Able to wiggle toes and squeeze with his hands.  Skin: Skin is warm. Nails show no clubbing.  As per nursing staff stage II decubitus on buttock  Psychiatric:   Lethargic      Data Reviewed: Basic Metabolic Panel: Recent Labs  Lab 02/05/18 0938 02/06/18 1702 02/07/18 0455 02/08/18 0409 02/09/18 0558  NA 137 139 138 138 136  K 3.6 3.9 4.2 4.3 4.4  CL 96* 98* 96* 94* 91*  CO2 35* 36* 37* 39* 42*  GLUCOSE 182* 165* 154* 130* 193*  BUN _0 22* 31*  CREATININE 0.62 0.55* 0.61 0.67 0.78  CALCIUM 7.9* 7.8* 8.0* 8.2* 8.0*  MG  --   --   --  1.7 1.8  PHOS  --   --   --  4.0 4.1   Liver Function Tests: Recent Labs  Lab 02/07/18 0455  ALBUMIN 1.6*   CBC: Recent Labs  Lab 02/03/18 0441 02/04/18 0835 02/06/18 0430 02/07/18 0455 02/08/18 0409 02/08/18 1747 02/09/18 0558  WBC 12.1* 17.4* 15.5* 14.8* 12.7* 12.3* 11.9*  NEUTROABS 9.2* 12.7*  --  11.8*  --   --   --   HGB 7.6* 8.0* 7.2* 7.2* 6.9* 7.2* 7.0*  HCT 23.4* 25.2* 22.8* 22.5* 21.3* 22.2* 20.5*  MCV 85.0 85.9 85.6 85.0 85.3 85.1 84.8  PLT 370 452* 467* 480* 443* 389 361   BNP (last 3 results) Recent Labs    01/06/18 1944 01/16/18 2332  BNP 37.0 402.0*     CBG: Recent Labs  Lab 02/08/18 1946 02/08/18 2313 02/09/18 0402 02/09/18 0724 02/09/18 1220  GLUCAP  125* 196* 172* 176* 189*    Recent Results (from the past 240 hour(s))  CULTURE, BLOOD (ROUTINE X 2) w Reflex to ID Panel     Status: Abnormal   Collection Time: 01/31/18  8:17 AM  Result Value Ref Range Status   Specimen Description   Final    BLOOD LEFT HAND Performed at [facility], [place] [address], [place], [place] [address]    Special Requests   Final    BOTTLES DRAWN AEROBIC AND ANAEROBIC Blood Culture results may not be optimal due to an excessive volume of blood received in culture bottles Performed at [facility], [address] [place], [place] [address]    Culture  Setup Time   Final    GRAM NEGATIVE RODS AEROBIC BOTTLE ONLY CRITICAL RESULT CALLED TO, READ BACK BY AND VERIFIED WITH: DAVID BESANTI ON 02/05/18 AT 0228 JAG Performed at [facility], [place]  [address] [place], [place] [address]    Culture KLEBSIELLA PNEUMONIAE (A)  Final   Report Status 02/07/2018 FINAL  Final   Organism ID, Bacteria KLEBSIELLA PNEUMONIAE  Final      Susceptibility   Klebsiella pneumoniae - MIC*    AMPICILLIN >=32 RESISTANT Resistant     CEFAZOLIN <=4 SENSITIVE Sensitive     CEFEPIME <=1 SENSITIVE Sensitive     CEFTAZIDIME <=1 SENSITIVE Sensitive     CEFTRIAXONE <=1 SENSITIVE Sensitive     CIPROFLOXACIN <=0.25 SENSITIVE Sensitive     GENTAMICIN <=1 SENSITIVE Sensitive     IMIPENEM <=0.25 SENSITIVE Sensitive     TRIMETH/SULFA <=20 SENSITIVE Sensitive     AMPICILLIN/SULBACTAM >=32 RESISTANT Resistant     PIP/TAZO 16 SENSITIVE Sensitive     Extended ESBL NEGATIVE Sensitive     * KLEBSIELLA PNEUMONIAE  Blood Culture ID Panel (Reflexed)     Status: Abnormal   Collection Time: 01/31/18  8:17 AM  Result Value Ref Range Status   Enterococcus species NOT DETECTED NOT DETECTED Final   Listeria monocytogenes NOT DETECTED NOT DETECTED Final   Staphylococcus species NOT DETECTED NOT DETECTED Final   Staphylococcus aureus NOT DETECTED NOT DETECTED Final   Streptococcus species NOT DETECTED NOT DETECTED Final   Streptococcus agalactiae NOT DETECTED NOT DETECTED Final   Streptococcus pneumoniae NOT DETECTED NOT DETECTED Final   Streptococcus pyogenes NOT DETECTED NOT DETECTED Final   Acinetobacter baumannii NOT DETECTED NOT DETECTED Final   Enterobacteriaceae species DETECTED (A) NOT DETECTED Final    Comment: Enterobacteriaceae represent a large family of gram-negative bacteria, not a single organism. CRITICAL RESULT CALLED TO, READ BACK BY AND VERIFIED WITH: DAVID BESANTI ON 02/05/18 AT 0228 JAG    Enterobacter cloacae complex NOT DETECTED NOT DETECTED Final   Escherichia coli NOT DETECTED NOT DETECTED Final   Klebsiella oxytoca NOT DETECTED NOT DETECTED Final   Klebsiella pneumoniae DETECTED (A) NOT DETECTED Final    Comment: CRITICAL RESULT CALLED TO, READ BACK BY  AND VERIFIED WITH: DAVID BESANTI ON 02/05/18 AT 0228 JAG    Proteus species NOT DETECTED NOT DETECTED Final   Serratia marcescens NOT DETECTED NOT DETECTED Final   Carbapenem resistance NOT DETECTED NOT DETECTED Final   Haemophilus influenzae NOT DETECTED NOT DETECTED Final   Neisseria meningitidis NOT DETECTED NOT DETECTED Final   Pseudomonas aeruginosa NOT DETECTED NOT DETECTED Final   Candida albicans NOT DETECTED NOT DETECTED Final   Candida glabrata NOT DETECTED NOT DETECTED Final   Candida krusei NOT DETECTED NOT DETECTED Final  Candida parapsilosis NOT DETECTED NOT DETECTED Final   Candida tropicalis NOT DETECTED NOT DETECTED Final    Comment: Performed at [facility], [address], [place], [place] [address]  Urine Culture     Status: None   Collection Time: 01/31/18  8:30 AM  Result Value Ref Range Status   Specimen Description   Final    URINE, RANDOM Performed at [facility], [address], [place], [place] [address]    Special Requests   Final    NONE Performed at [facility], [address], [place], [place] [address]    Culture   Final    NO GROWTH Performed at [facility], [place] [address], [place], [place] [address]    Report Status 02/01/2018 FINAL  Final  CULTURE, BLOOD (ROUTINE X 2) w Reflex to ID Panel     Status: Abnormal   Collection Time: 01/31/18  8:53 AM  Result Value Ref Range Status   Specimen Description   Final    BLOOD BLOOD RIGHT HAND Performed at [facility], [address], [place], [place] [address]    Special Requests   Final    BOTTLES DRAWN AEROBIC AND ANAEROBIC Blood Culture adequate volume Performed at [facility], [address] [place], [place] [address]    Culture  Setup Time   Final    GRAM NEGATIVE RODS AEROBIC BOTTLE ONLY CRITICAL RESULT CALLED TO, READ BACK BY AND VERIFIED WITH: DAVID BESANTI AT 0258 ON 02/05/18 [place]. Performed at [facility], [address] [place], [place] [address]    Culture (A)  Final    KLEBSIELLA PNEUMONIAE SUSCEPTIBILITIES PERFORMED ON PREVIOUS CULTURE WITHIN THE LAST 5 DAYS. Performed at [facility], [place] [address] [place], [place] [address]    Report Status 02/07/2018 FINAL  Final  Culture, respiratory (NON-Expectorated)     Status: None   Collection Time: 01/31/18 10:47 AM  Result Value Ref Range Status   Specimen Description   Final    TRACHEAL ASPIRATE Performed at [facility], [address] [place], [place] [address]    Special Requests   Final    NONE Performed at [facility], [address] [place], [place] [address]    Gram Stain   Final    MODERATE WBC PRESENT, PREDOMINANTLY PMN MODERATE GRAM POSITIVE COCCI MODERATE GRAM POSITIVE RODS    Culture   Final    Consistent with normal respiratory flora. Performed at [facility], [place] [address], [place], [place] [address]    Report Status 02/02/2018 FINAL  Final  CULTURE, BLOOD (ROUTINE X 2) w Reflex to ID Panel     Status: None   Collection Time: 02/04/18  5:41 PM  Result Value Ref Range Status   Specimen Description BLOOD BLOOD LEFT HAND  Final   Special Requests   Final    BOTTLES DRAWN AEROBIC AND ANAEROBIC Blood Culture adequate volume   Culture   Final    NO GROWTH 5 DAYS Performed at [facility], [address] [place],  [address]    Report Status 02/09/2018 FINAL  Final  CULTURE, BLOOD (ROUTINE X 2) w Reflex to ID Panel     Status: None (Preliminary result)   Collection Time: 02/04/18  8:50 PM  Result Value Ref Range Status   Specimen Description BLOOD LEFT HAND  Final   Special Requests   Final    BOTTLES DRAWN AEROBIC  AND ANAEROBIC Blood Culture adequate volume   Culture   Final    NO GROWTH 4 DAYS Performed at [facility], [address] [place], [place] [address]    Report Status PENDING  Incomplete  MRSA PCR Screening     Status: None   Collection  Time: 02/05/18  9:38 AM  Result Value Ref Range Status   MRSA by PCR NEGATIVE NEGATIVE Final    Comment:        The GeneXpert MRSA Assay (FDA approved for NASAL specimens only), is one component of a comprehensive MRSA colonization surveillance program. It is not intended to diagnose MRSA infection nor to guide or monitor treatment for MRSA infections. Performed at [facility], [address] [place], [place] [address]      Studies: Dg Chest [facility] 1 View  Result Date: 02/09/2018 CLINICAL DATA:  Sudden onset low stats, known ARDS EXAM: PORTABLE CHEST 1 VIEW COMPARISON:  02/08/2018 FINDINGS: Tracheostomy tube appears stable in position. RIGHT-sided PICC line tip overlies the superior vena cava. The heart size is normal accounting for patient position. There are diffuse opacities throughout the lungs bilaterally which are associated with air bronchograms. More confluent opacities at the bases. Overall the appearance is stable accounting for slight differences in technique. IMPRESSION: Diffuse lung opacities, stable in appearance.  No pneumothorax. Electronically Signed   By: Nolon Nations M.D.   On: 02/09/2018 15:21   Dg Chest Port 1 View  Result Date: 02/08/2018 CLINICAL DATA:  Acute respiratory failure EXAM: PORTABLE CHEST 1 VIEW COMPARISON:  Feb 06, 2018. FINDINGS: The heart size and mediastinal contours are stable. The heart size is enlarged. Tracheostomy tube is identified in good position. Right central venous line is identified with distal tip in the superior vena cava. Diffuse opacity is identified throughout bilateral lungs. There are probable bilateral pleural effusions. The visualized skeletal structures are stable. IMPRESSION: Diffuse airspace disease identified in both lungs unchanged compared to prior exam. Probable small bilateral pleural effusions. Electronically Signed   By: Abelardo Diesel M.D.   On: 02/08/2018 07:14    Scheduled Meds: . amiodarone  400 mg Per  Tube BID  . atorvastatin  80 mg Per Tube q1800  . budesonide (PULMICORT) nebulizer solution  0.5 mg Nebulization BID  . chlorhexidine gluconate (MEDLINE KIT)  15 mL Mouth Rinse BID  . clonazePAM  1 mg Per Tube BID  . digoxin  0.25 mg Per Tube Daily  . enoxaparin (LOVENOX) injection  40 mg Subcutaneous Q24H  . fentaNYL  100 mcg Transdermal Q72H  . FLUoxetine  40 mg Per Tube Daily  . free water  200 mL Per Tube Q8H  . furosemide  40 mg Intravenous Daily  . gabapentin  600 mg Per Tube Q8H  . insulin aspart  0-20 Units Subcutaneous Q4H  . insulin glargine  20 Units Subcutaneous QHS  . ipratropium-albuterol  3 mL Nebulization Q6H  . lisinopril  5 mg Per Tube Daily  . mouth rinse  15 mL Mouth Rinse 10 times per day  . metoCLOPramide  10 mg Oral Q12H  . metolazone  10 mg Oral QPC breakfast  . metoprolol tartrate  25 mg Per Tube Q6H  . nutrition supplement (JUVEN)  1 packet Per Tube BID  . nystatin  5 mL Oral QID  . pantoprazole sodium  40 mg Per Tube Daily  . Valproate Sodium  1,000 mg Per Tube BID   Continuous Infusions: . sodium chloride    .  ceFAZolin (ANCEF) IV Stopped (02/09/18 1303)  . DOPamine 5 mcg/kg/min (02/09/18 1520)  . feeding supplement (GLUCERNA 1.5 CAL) 65 mL/hr at 02/09/18 1200  . fentaNYL infusion INTRAVENOUS 50 mcg/hr (02/09/18 1519)    Assessment/Plan:  1. Ventilator associated pneumonia.  Sepsis with Klebsiella and blood cultures.  As per infectious disease can stop Vanco and continue Ancef antibiotics through 5/13. 2. Acute hypoxic respiratory failure.  Case discussed with critical care specialist and this is concerning for ARDS.  Patient still on 100% FiO2.  Overall prognosis is poor being on oxygen high FiO2 for over a week.  Patient is still a full code. 3. Atrial fibrillation with rapid ventricular response and episodes of SVT.  Patient on amiodarone, digoxin and metoprolol 4. Acute on chronic systolic congestive heart failure with anasarca and scrotal edema  on IV Lasix and metolazone 5. Acute encephalopathy with history of traumatic brain injury 6. Recent GI bleed on PPI.  Endoscopy negative Xarelto stopped.  Last hemoglobin 6.9 7. Acute kidney injury.  Has improved 8. Type 2 diabetes mellitus on glargine insulin and sliding scale 9. Nutrition.  On tube feeds as per intensivist 10. Depression on Depakene 11. Hyperlipidemia unspecified on atorvastatin 12. Stage II decubitus on buttock  Code Status:     Code Status Orders  (From admission, onward)        Start     Ordered   01/06/18 2227  Full code  Continuous     01/06/18 2226    Code Status History    This patient has a current code status but no historical code status.     Family Communication: As per critical care specialist Disposition Plan: To be determined  Consultants:  Critical care specialist  Infectious disease  Antibiotics:  Ancef  Time spent: 25 minutes  [place]

## 2018-02-09 NOTE — Progress Notes (Signed)
Patient ID: Nemesio Castrillon, male   DOB: 22-Dec-1960, 57 y.o.   MRN: [id number] Pulmonary/critical care  Called to see patient secondary to desaturation.  Patient taken off the ventilator and bagged Suction catheter passed through tracheostomy site no evidence of obstruction.  Patient bagged with Peep valve with improved oxygen saturations Stat portable chest x-ray does not reveal any pneumothorax but reveals ARDS.  Patient placed back on mechanical ventilation, paralyzed, sedated, on ARDS protocol with 6 cc/kg tidal volume, higher PEEP, increasing rate.  Will check PCO2 and pH after patient settles on the settings.  Change to pressure control.  Call patient's mother and related how sick and critical he is.  She understands and is going to come in  [employer], DO

## 2018-02-10 ENCOUNTER — Inpatient Hospital Stay: Payer: Medicaid Other

## 2018-02-10 DIAGNOSIS — I471 Supraventricular tachycardia: Secondary | ICD-10-CM

## 2018-02-10 DIAGNOSIS — J95851 Ventilator associated pneumonia: Secondary | ICD-10-CM

## 2018-02-10 LAB — GLUCOSE, CAPILLARY
GLUCOSE-CAPILLARY: 151 mg/dL — AB (ref 65–99)
GLUCOSE-CAPILLARY: 159 mg/dL — AB (ref 65–99)
GLUCOSE-CAPILLARY: 162 mg/dL — AB (ref 65–99)
GLUCOSE-CAPILLARY: 177 mg/dL — AB (ref 65–99)
Glucose-Capillary: 181 mg/dL — ABNORMAL HIGH (ref 65–99)
Glucose-Capillary: 170 mg/dL — ABNORMAL HIGH (ref 65–99)
Glucose-Capillary: 154 mg/dL — ABNORMAL HIGH (ref 65–99)
Glucose-Capillary: 148 mg/dL — ABNORMAL HIGH (ref 65–99)

## 2018-02-10 LAB — BASIC METABOLIC PANEL
ANION GAP: 6 (ref 5–15)
BUN: 35 mg/dL — ABNORMAL HIGH (ref 6–20)
CHLORIDE: 89 mmol/L — AB (ref 101–111)
CO2: 43 mmol/L — ABNORMAL HIGH (ref 22–32)
Calcium: 8.4 mg/dL — ABNORMAL LOW (ref 8.9–10.3)
Creatinine, Ser: 0.75 mg/dL (ref 0.61–1.24)
GFR calc Af Amer: >60 mL/min (ref >60)
GLUCOSE: 191 mg/dL — AB (ref 65–99)
POTASSIUM: 4.2 mmol/L (ref 3.5–5.1)
Sodium: 138 mmol/L (ref 135–145)

## 2018-02-10 LAB — BLOOD GAS, ARTERIAL
ACID-BASE EXCESS: 24.9 mmol/L — AB (ref 0.0–2.0)
Bicarbonate: 51.1 mmol/L — ABNORMAL HIGH (ref 20.0–28.0)
FIO2: 1
LHR: 24 {breaths}/min
O2 Saturation: 96.2 %
PEEP: 15 cmH2O
Patient temperature: 37
pCO2 arterial: 67 mmHg (ref 32.0–48.0)
pH, Arterial: 7.49 — ABNORMAL HIGH (ref 7.350–7.450)
pO2, Arterial: 77 mmHg — ABNORMAL LOW (ref 83.0–108.0)

## 2018-02-10 LAB — CULTURE, BLOOD (ROUTINE X 2)
Culture: NO GROWTH
SPECIAL REQUESTS: ADEQUATE

## 2018-02-10 LAB — PHOSPHORUS: Phosphorus: 3.1 mg/dL (ref 2.5–4.6)

## 2018-02-10 LAB — CBC
HEMATOCRIT: 22.6 % — AB (ref 40.0–52.0)
HEMOGLOBIN: 7.4 g/dL — AB (ref 13.0–18.0)
MCH: 27.6 pg (ref 26.0–34.0)
MCHC: 32.8 g/dL (ref 32.0–36.0)
MCV: 84.1 fL (ref 80.0–100.0)
Platelets: 430 10*3/uL (ref 150–440)
RBC: 2.69 MIL/uL — AB (ref 4.40–5.90)
RDW: 18.4 % — ABNORMAL HIGH (ref 11.5–14.5)
WBC: 16.6 10*3/uL — AB (ref 3.8–10.6)

## 2018-02-10 LAB — TRIGLYCERIDES: Triglycerides: 587 mg/dL — ABNORMAL HIGH (ref <150)

## 2018-02-10 LAB — MAGNESIUM: Magnesium: 2 mg/dL (ref 1.7–2.4)

## 2018-02-10 MED ORDER — DEXMEDETOMIDINE HCL IN NACL 400 MCG/100ML IV SOLN
0.4000 ug/kg/h | INTRAVENOUS | Status: DC
  Administered 2018-02-10 (×2): 0.4 ug/kg/h via INTRAVENOUS
  Administered 2018-02-11: 0.574 ug/kg/h via INTRAVENOUS
  Filled 2018-02-10 (×3): qty 100

## 2018-02-10 MED ORDER — PROPOFOL 1000 MG/100ML IV EMUL
5.0000 ug/kg/min | INTRAVENOUS | Status: DC
  Administered 2018-02-10: 10 ug/kg/min via INTRAVENOUS

## 2018-02-10 MED ORDER — ACETYLCYSTEINE 20 % IN SOLN
2.0000 mL | Freq: Four times a day (QID) | RESPIRATORY_TRACT | Status: DC
  Administered 2018-02-10 – 2018-02-11 (×4): 2 mL via RESPIRATORY_TRACT
  Filled 2018-02-10 (×4): qty 4

## 2018-02-10 MED ORDER — PROPOFOL 1000 MG/100ML IV EMUL
INTRAVENOUS | Status: AC
  Administered 2018-02-10: 10 ug/kg/min via INTRAVENOUS
  Filled 2018-02-10: qty 100

## 2018-02-10 MED ORDER — METOPROLOL TARTRATE 5 MG/5ML IV SOLN
INTRAVENOUS | Status: AC
  Administered 2018-02-10: 5 mg via INTRAVENOUS
  Filled 2018-02-10: qty 5

## 2018-02-10 MED ORDER — FREE WATER
200.0000 mL | Freq: Two times a day (BID) | Status: DC
  Administered 2018-02-10: 200 mL

## 2018-02-10 MED ORDER — METOPROLOL TARTRATE 5 MG/5ML IV SOLN
5.0000 mg | Freq: Once | INTRAVENOUS | Status: AC
  Administered 2018-02-10: 5 mg via INTRAVENOUS

## 2018-02-10 NOTE — Progress Notes (Signed)
Pharmacy Electrolyte Monitoring Consult:  Pharmacy consulted to assist in monitoring and replacing electrolytes in this 57 y.o. male admitted on 01/06/2018   Patient requiring mechanical ventilation and has PEG tube.   Patient ordered furosemide  IV daily.  Labs:  Sodium (mmol/L)  Date Value  02/10/2018 138   Potassium (mmol/L)  Date Value  02/10/2018 4.2   Magnesium (mg/dL)  Date Value  16/07/9603 2.0   Phosphorus (mg/dL)  Date Value  54/06/8118 3.1   Calcium (mg/dL)  Date Value  14/78/2956 8.4 (L)   Albumin (g/dL)  Date Value  21/30/8657 1.6 (L)    Plan: Electrolyte WNL this AM.   Will  continue Q48hr electrolyte checks. Labs ordered for 5/15.  Pharmacy will continue to monitor and adjust per consult.   Gardner Candle, PharmD, BCPS Clinical Pharmacist 02/10/2018 8:29 AM

## 2018-02-10 NOTE — Progress Notes (Signed)
[place] INFECTIOUS DISEASE PROGRESS NOTE Date of Admission:  01/06/2018     ID: Tommy Mathews is a 57 y.o. male with fever Principal Problem:   Severe sepsis with septic shock ([place]) Active Problems:   GI bleed   Acute respiratory failure with hypoxia (HCC)   Multifocal pneumonia   UTI (urinary tract infection)   AKI (acute kidney injury) ([place])   Ileus (HCC)   Diabetes ([place])   Pressure injury of skin   Subjective: Some diff with HR and BP, wbc up to 16.  Lots of secretions. ROS  Unable to obtain Medications:  Antibiotics Given (last 72 hours)    Date/Time Action Medication Dose Rate   02/07/18 2200 New Bag/Given   vancomycin (VANCOCIN) 1,500 mg in sodium chloride 0.9 % 500 mL IVPB 1,500 mg 250 mL/hr   02/07/18 2236 New Bag/Given   meropenem (MERREM) 1 g in sodium chloride 0.9 % 100 mL IVPB 1 g 200 mL/hr   02/08/18 1202 New Bag/Given   ceFAZolin (ANCEF) IVPB 2g/100 mL premix 2 g 200 mL/hr   02/08/18 2029 New Bag/Given   ceFAZolin (ANCEF) IVPB 2g/100 mL premix 2 g 200 mL/hr   02/09/18 0429 New Bag/Given   ceFAZolin (ANCEF) IVPB 2g/100 mL premix 2 g 200 mL/hr   02/09/18 1233 New Bag/Given   ceFAZolin (ANCEF) IVPB 2g/100 mL premix 2 g 200 mL/hr   02/09/18 1950 New Bag/Given   ceFAZolin (ANCEF) IVPB 2g/100 mL premix 2 g 200 mL/hr   02/10/18 0455 New Bag/Given   ceFAZolin (ANCEF) IVPB 2g/100 mL premix 2 g 200 mL/hr     . acetylcysteine  2 mL Nebulization Q6H  . amiodarone  400 mg Per Tube BID  . atorvastatin  80 mg Per Tube q1800  . budesonide (PULMICORT) nebulizer solution  0.5 mg Nebulization BID  . chlorhexidine gluconate (MEDLINE KIT)  15 mL Mouth Rinse BID  . clonazePAM  1 mg Per Tube BID  . digoxin  0.25 mg Per Tube Daily  . enoxaparin (LOVENOX) injection  40 mg Subcutaneous Q24H  . FLUoxetine  40 mg Per Tube Daily  . free water  200 mL Per Tube Q8H  . furosemide  40 mg Intravenous Daily  . gabapentin  600 mg Per Tube Q8H  . insulin aspart  0-20 Units  Subcutaneous Q4H  . insulin glargine  20 Units Subcutaneous QHS  . ipratropium-albuterol  3 mL Nebulization Q6H  . lisinopril  5 mg Per Tube Daily  . mouth rinse  15 mL Mouth Rinse 10 times per day  . metoCLOPramide  10 mg Oral Q12H  . metolazone  10 mg Oral QPC breakfast  . metoprolol tartrate  25 mg Per Tube Q6H  . nutrition supplement (JUVEN)  1 packet Per Tube BID  . nystatin  5 mL Oral QID  . pantoprazole sodium  40 mg Per Tube Daily  . Valproate Sodium  1,000 mg Per Tube BID    Objective: Vital signs in last 24 hours: Temp:  [98.4 F (36.9 C)-100 F (37.8 C)] 99.8 F (37.7 C) (05/13 1200) Pulse Rate:  [64-167] 64 (05/13 1500) Resp:  [16-28] 28 (05/13 1500) BP: (85-139)/(33-105) 85/45 (05/13 1500) SpO2:  [80 %-100 %] 99 % (05/13 1500) FiO2 (%):  [98 %-100 %] 100 % (05/13 1335) Weight:  [116.4 kg (256 lb 9.9 oz)] 116.4 kg (256 lb 9.9 oz) (05/13 0500) Constitutional: lethargic, HENT: trach in place, anicteric Mouth/Throat: Oropharynx is clear and moist.  Cardiovascular: Normal rate, regular  rhythm and normal heart sounds.  Pulmonary/Chest: rhonchi bil Abdominal: Soft. Bowel sounds are normal. He exhibits no distension. There is no tenderness.  PEG Site wnl Lymphadenopathy: He has no cervical adenopathy.  Neurological: lethargic Skin: Skin is warm and dry. No rash noted. No erythema.  Psychiatric: trached PICC RUE WNL    Lab Results Recent Labs    02/09/18 0558 02/10/18 0509  WBC 11.9* 16.6*  HGB 7.0* 7.4*  HCT 20.5* 22.6*  NA 136 138  K 4.4 4.2  CL 91* 89*  CO2 42* 43*  BUN 31* 35*  CREATININE 0.78 0.75    Microbiology: Results for orders placed or performed during the hospital encounter of 01/06/18  Blood Culture (routine x 2)     Status: None   Collection Time: 01/06/18  7:44 PM  Result Value Ref Range Status   Specimen Description BLOOD BLOOD LEFT WRIST  Final   Special Requests   Final    BOTTLES DRAWN AEROBIC AND ANAEROBIC Blood Culture adequate  volume   Culture   Final    NO GROWTH 5 DAYS Performed at [facility], [address] [place], [place] [address]    Report Status 01/11/2018 FINAL  Final  Urine culture     Status: Abnormal   Collection Time: 01/06/18  7:44 PM  Result Value Ref Range Status   Specimen Description   Final    URINE, RANDOM Performed at [facility], [address], [place], [place] [address]    Special Requests   Final    NONE Performed at [facility], [address], [place], [place] [address]    Culture (A)  Final    10,000 COLONIES/mL STAPHYLOCOCCUS SPECIES (COAGULASE NEGATIVE) CALL MICROBIOLOGY LAB IF SENSITIVITIES ARE REQUIRED. Performed at [facility], [place] [address], [place], [place] [address]    Report Status 01/08/2018 FINAL  Final  MRSA PCR Screening     Status: Abnormal   Collection Time: 01/06/18 11:51 PM  Result Value Ref Range Status   MRSA by PCR POSITIVE (A) NEGATIVE Final    Comment:        The GeneXpert MRSA Assay (FDA approved for NASAL specimens only), is one component of a comprehensive MRSA colonization surveillance program. It is not intended to diagnose MRSA infection nor to guide or monitor treatment for MRSA infections. RESULT CALLED TO, READ BACK BY AND VERIFIED WITH: BETH BUONO 01/07/18 @ 0121  [place]   Performed at [facility], [address] [place], [place] [address]   Blood Culture (routine x 2)     Status: None   Collection Time: 01/07/18  7:19 AM  Result Value Ref Range Status   Specimen Description BLOOD RIGHT HAND  Final   Special Requests   Final    BOTTLES DRAWN AEROBIC AND ANAEROBIC Blood Culture results may not be optimal due to an inadequate volume of blood received in culture bottles   Culture   Final    NO GROWTH 5 DAYS Performed at [facility], [address], [place], [place] [address]    Report Status 01/12/2018 FINAL  Final  Culture, respiratory (NON-Expectorated)     Status: None    Collection Time: 01/07/18 12:28 PM  Result Value Ref Range Status   Specimen Description   Final    TRACHEAL ASPIRATE Performed at [facility], [address] [place], [place] [address]    Special Requests   Final    Normal Performed at  [place], [place] [address]    Gram Stain   Final    MODERATE WBC PRESENT,BOTH PMN AND MONONUCLEAR RARE GRAM POSITIVE COCCI RARE YEAST    Culture   Final    Consistent with normal respiratory flora. Performed at [facility], [place] [address] [place], [place] [address]    Report Status 01/09/2018 FINAL  Final  Culture, expectorated sputum-assessment     Status: None   Collection Time: 01/18/18  5:07 PM  Result Value Ref Range Status   Specimen Description ENDOTRACHEAL  Final   Special Requests NONE  Final   Sputum evaluation   Final    THIS SPECIMEN IS ACCEPTABLE FOR SPUTUM CULTURE Performed at [facility], [address], [place], [place] [address]    Report Status 01/18/2018 FINAL  Final  Culture, respiratory (NON-Expectorated)     Status: None   Collection Time: 01/18/18  5:07 PM  Result Value Ref Range Status   Specimen Description   Final    ENDOTRACHEAL Performed at [facility], [address], [place], [place] [address]    Special Requests   Final    NONE Reflexed from [id number] Performed at [facility], [address] [place], [place] [address]    Gram Stain   Final    MODERATE WBC PRESENT,BOTH PMN AND MONONUCLEAR NO SQUAMOUS EPITHELIAL CELLS SEEN RARE BUDDING YEAST SEEN    Culture   Final    RARE Consistent with normal respiratory flora. Performed at [facility], [place] [address], [place], [place] [address]    Report Status 01/21/2018 FINAL  Final  MRSA PCR Screening     Status: Abnormal   Collection Time: 01/18/18  8:26 PM  Result Value Ref Range Status   MRSA by PCR POSITIVE (A) NEGATIVE Final    Comment:        The  GeneXpert MRSA Assay (FDA approved for NASAL specimens only), is one component of a comprehensive MRSA colonization surveillance program. It is not intended to diagnose MRSA infection nor to guide or monitor treatment for MRSA infections. RESULT CALLED TO, READ BACK BY AND VERIFIED WITH: ANGELA BREHUN AT 2200 ON 01/18/18 RWW Performed at [facility], [address] [place], [place] [address]   CULTURE, BLOOD (ROUTINE X 2) w Reflex to ID Panel     Status: Abnormal   Collection Time: 01/31/18  8:17 AM  Result Value Ref Range Status   Specimen Description   Final    BLOOD LEFT HAND Performed at [facility], [place] [address] [place], [place] [address]    Special Requests   Final    BOTTLES DRAWN AEROBIC AND ANAEROBIC Blood Culture results may not be optimal due to an excessive volume of blood received in culture bottles Performed at [facility], [address] [place], [place] [address]    Culture  Setup Time   Final    GRAM NEGATIVE RODS AEROBIC BOTTLE ONLY CRITICAL RESULT CALLED TO, READ BACK BY AND VERIFIED WITH: Tasneem Cormier BESANTI ON 02/05/18 AT 0228 JAG Performed at [facility], [place] [address], [place], [place] [address]    Culture KLEBSIELLA PNEUMONIAE (A)  Final   Report Status 02/07/2018 FINAL  Final   Organism ID, Bacteria KLEBSIELLA PNEUMONIAE  Final      Susceptibility   Klebsiella pneumoniae - MIC*    AMPICILLIN >=32 RESISTANT Resistant     CEFAZOLIN <=4 SENSITIVE Sensitive  CEFEPIME <=1 SENSITIVE Sensitive     CEFTAZIDIME <=1 SENSITIVE Sensitive     CEFTRIAXONE <=1 SENSITIVE Sensitive     CIPROFLOXACIN <=0.25 SENSITIVE Sensitive     GENTAMICIN <=1 SENSITIVE Sensitive     IMIPENEM <=0.25 SENSITIVE Sensitive     TRIMETH/SULFA <=20 SENSITIVE Sensitive     AMPICILLIN/SULBACTAM >=32 RESISTANT Resistant     PIP/TAZO 16 SENSITIVE Sensitive     Extended ESBL NEGATIVE Sensitive     * KLEBSIELLA PNEUMONIAE  Blood Culture ID Panel  (Reflexed)     Status: Abnormal   Collection Time: 01/31/18  8:17 AM  Result Value Ref Range Status   Enterococcus species NOT DETECTED NOT DETECTED Final   Listeria monocytogenes NOT DETECTED NOT DETECTED Final   Staphylococcus species NOT DETECTED NOT DETECTED Final   Staphylococcus aureus NOT DETECTED NOT DETECTED Final   Streptococcus species NOT DETECTED NOT DETECTED Final   Streptococcus agalactiae NOT DETECTED NOT DETECTED Final   Streptococcus pneumoniae NOT DETECTED NOT DETECTED Final   Streptococcus pyogenes NOT DETECTED NOT DETECTED Final   Acinetobacter baumannii NOT DETECTED NOT DETECTED Final   Enterobacteriaceae species DETECTED (A) NOT DETECTED Final    Comment: Enterobacteriaceae represent a large family of gram-negative bacteria, not a single organism. CRITICAL RESULT CALLED TO, READ BACK BY AND VERIFIED WITH: Maggie Dworkin BESANTI ON 02/05/18 AT 0228 JAG    Enterobacter cloacae complex NOT DETECTED NOT DETECTED Final   Escherichia coli NOT DETECTED NOT DETECTED Final   Klebsiella oxytoca NOT DETECTED NOT DETECTED Final   Klebsiella pneumoniae DETECTED (A) NOT DETECTED Final    Comment: CRITICAL RESULT CALLED TO, READ BACK BY AND VERIFIED WITH: Milea Klink BESANTI ON 02/05/18 AT 0228 JAG    Proteus species NOT DETECTED NOT DETECTED Final   Serratia marcescens NOT DETECTED NOT DETECTED Final   Carbapenem resistance NOT DETECTED NOT DETECTED Final   Haemophilus influenzae NOT DETECTED NOT DETECTED Final   Neisseria meningitidis NOT DETECTED NOT DETECTED Final   Pseudomonas aeruginosa NOT DETECTED NOT DETECTED Final   Candida albicans NOT DETECTED NOT DETECTED Final   [place] glabrata NOT DETECTED NOT DETECTED Final   [place] krusei NOT DETECTED NOT DETECTED Final   [place] parapsilosis NOT DETECTED NOT DETECTED Final   Candida tropicalis NOT DETECTED NOT DETECTED Final    Comment: Performed at [facility], [address], [place], [place] [address]  Urine Culture      Status: None   Collection Time: 01/31/18  8:30 AM  Result Value Ref Range Status   Specimen Description   Final    URINE, RANDOM Performed at [facility], [address] [place] [address], [place], [place] [address]    Special Requests   Final    NONE Performed at [facility], [address], [place], [place] [address]    Culture   Final    NO GROWTH Performed at [facility], [place] [address], [place], [place] [address]    Report Status 02/01/2018 FINAL  Final  CULTURE, BLOOD (ROUTINE X 2) w Reflex to ID Panel     Status: Abnormal   Collection Time: 01/31/18  8:53 AM  Result Value Ref Range Status   Specimen Description   Final    BLOOD BLOOD RIGHT HAND Performed at [facility], [address], [place], [place] [address]    Special Requests   Final    BOTTLES DRAWN AEROBIC AND ANAEROBIC Blood Culture adequate volume Performed at [facility], [place],  [place], [place] [address]    Culture  Setup Time   Final    GRAM NEGATIVE RODS AEROBIC BOTTLE ONLY CRITICAL RESULT CALLED TO, READ BACK BY AND VERIFIED WITH: Keyanah Kozicki BESANTI AT [place] ON 02/05/18 [place]. Performed at [facility], [address] [place], [place] [address]    Culture (A)  Final    KLEBSIELLA PNEUMONIAE SUSCEPTIBILITIES PERFORMED ON PREVIOUS CULTURE WITHIN THE LAST 5 DAYS. Performed at [facility], [place] [address] [place], [name] [address]    Report Status 02/07/2018 FINAL  Final  Culture, respiratory (NON-Expectorated)     Status: None   Collection Time: 01/31/18 10:47 AM  Result Value Ref Range Status   Specimen Description   Final    TRACHEAL ASPIRATE Performed at [facility], [address] [place], [place] [address]    Special Requests   Final    NONE Performed at [facility], [address] [place], [place] [address]    Gram Stain   Final    MODERATE WBC PRESENT, PREDOMINANTLY PMN MODERATE GRAM POSITIVE COCCI MODERATE GRAM  POSITIVE RODS    Culture   Final    Consistent with normal respiratory flora. Performed at [facility], [place] [address], [place], [place] [address]    Report Status 02/02/2018 FINAL  Final  CULTURE, BLOOD (ROUTINE X 2) w Reflex to ID Panel     Status: None   Collection Time: 02/04/18  5:41 PM  Result Value Ref Range Status   Specimen Description BLOOD BLOOD LEFT HAND  Final   Special Requests   Final    BOTTLES DRAWN AEROBIC AND ANAEROBIC Blood Culture adequate volume   Culture   Final    NO GROWTH 5 DAYS Performed at [facility], [address] [place], [place] [address]    Report Status 02/09/2018 FINAL  Final  CULTURE, BLOOD (ROUTINE X 2) w Reflex to ID Panel     Status: None   Collection Time: 02/04/18  8:50 PM  Result Value Ref Range Status   Specimen Description BLOOD LEFT HAND  Final   Special Requests   Final    BOTTLES DRAWN AEROBIC AND ANAEROBIC Blood Culture adequate volume   Culture   Final    NO GROWTH 5 DAYS Performed at [facility], [address] [place], [place] [address]    Report Status 02/10/2018 FINAL  Final  MRSA PCR Screening     Status: None   Collection Time: 02/05/18  9:38 AM  Result Value Ref Range Status   MRSA by PCR NEGATIVE NEGATIVE Final    Comment:        The GeneXpert MRSA Assay (FDA approved for NASAL specimens only), is one component of a comprehensive MRSA colonization surveillance program. It is not intended to diagnose MRSA infection nor to guide or monitor treatment for MRSA infections. Performed at [facility], [address] [place], [place] [address]     Studies/Results: Dg Chest Port 1 View  Result Date: 02/10/2018 CLINICAL DATA:  Hypoxia. EXAM: PORTABLE CHEST 1 VIEW COMPARISON:  Chest x-ray from same day at 5:23 a.m. FINDINGS: Unchanged tracheostomy tube and right sided PICC line. Stable cardiomediastinal silhouette. Diffuse bilateral pulmonary infiltrates are similar to prior  study. No large pleural effusion or pneumothorax. No acute osseous abnormality. IMPRESSION: Diffuse bilateral pulmonary infiltrates, unchanged. Electronically Signed   By: Titus Dubin M.D.   On: 02/10/2018 12:51   Dg Chest [facility]  Result Date: 02/10/2018 CLINICAL DATA:  Acute respiratory failure EXAM: PORTABLE CHEST 1 VIEW COMPARISON:  02/09/2018 FINDINGS: Tracheostomy tube and right-sided PICC line are noted in satisfactory position. Diffuse bilateral infiltrates are again identified and stable. No new focal abnormality is noted. IMPRESSION: Stable appearance of bilateral infiltrates. Electronically Signed   By: Inez Catalina M.D.   On: 02/10/2018 07:12   Dg Chest Port 1 View  Result Date: 02/09/2018 CLINICAL DATA:  Sudden onset low stats, known ARDS EXAM: PORTABLE CHEST 1 VIEW COMPARISON:  02/08/2018 FINDINGS: Tracheostomy tube appears stable in position. RIGHT-sided PICC line tip overlies the superior vena cava. The heart size is normal accounting for patient position. There are diffuse opacities throughout the lungs bilaterally which are associated with air bronchograms. More confluent opacities at the bases. Overall the appearance is stable accounting for slight differences in technique. IMPRESSION: Diffuse lung opacities, stable in appearance.  No pneumothorax. Electronically Signed   By: Nolon Nations M.D.   On: 02/09/2018 15:21    Assessment/Plan: Tommy Mathews is a 57 y.o. 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 He has had a prolonged admission and is now trached and has PEG. He was treated with IV abx until 4/15 then again 4/20-21. Was then off of abx until developed fever 5/2 with wbc up some to 12. CXR however did not show any new opacities but has increasing resp requirements and increased sputum.  PICC line RUE changed. Prior sputum cx with Nml flora 4/20. Prior bcx neg. FU BCX  and sputum pending.  5/6- defervesced, bcx neg, sputum cx negative. WBC down 17->12. Day 4 of vanco and mero. Imaging shows no change in severe patchy opacities bilaterally.  5/8 had fever and increased wbc. cx now + klebsiellafrom 5/3 5/10 - no fevers, cx with Kleb sensitiive to multple abx  5/13- stop date today for abx. No fever wbc up some to 16.  Recommendations Today would be totay 10 day course coverage for the Kleb bacteremia- However increased HD issues, wbc up and persistent secretions so will continue for now.  Check sputum cx Thank you very much for the consult. Will follow with you.  Tommy Mathews   02/10/2018, 3:58 PM

## 2018-02-10 NOTE — Progress Notes (Signed)
PT Cancellation Note  Patient Details Name: Tommy Mathews MRN: [id number] DOB: 09/29/1961   Cancelled Treatment:    Reason Eval/Treat Not Completed: Medical issues which prohibited therapy(Chart reveiwed for re-attempt at patient treatment.  Patient remains in medically-fragile state; max ventilatory support with increased sedatives/paralytics required. Unable to tolerate activity/participate with session for 6 consistent attempts.  Will complete order at this time due to medical instability and overall inability to actively participate with session.  Please re-consult as patient medically stable and appropriate for active participation.)   Sorren Vallier H. [name], PT, DPT, NCS 02/10/18, 1:53 PM [phone]

## 2018-02-10 NOTE — Progress Notes (Signed)
Patient ID: Tommy Mathews, male   DOB: Aug 22, 1961, 57 y.o.   MRN: [id number]  [employer] [facility] PROGRESS NOTE  Tommy Mathews [id number]:[id number] DOB: June 07, 1961 DOA: 01/06/2018 PCP: Isaias Cowman, MD  HPI/Subjective: Patient trying to talk but unable to do so secondary to being on the ventilator with the tracheostomy.  Patient able to squeeze my hand.  Objective: Vitals:   02/10/18 1400 02/10/18 1500  BP: 108/67 (!) 85/45  Pulse: 90 64  Resp: (!) 23 (!) 28  Temp:    SpO2: 97% 99%    Filed Weights   01/20/18 1200 01/26/18 0418 02/10/18 0500  Weight: (!) 139.5 kg (307 lb 8.7 oz) 133.2 kg (293 lb 10.4 oz) 116.4 kg (256 lb 9.9 oz)    ROS: Review of Systems  Unable to perform ROS: Acuity of condition   Exam: Physical Exam  Constitutional: He appears lethargic.  HENT:  Nose: No mucosal edema.  Mouth/Throat: No oropharyngeal exudate or posterior oropharyngeal edema.  Eyes: Pupils are equal, round, and reactive to light. Conjunctivae, EOM and lids are normal.  Neck: No JVD present. Carotid bruit is not present. No edema present. No thyroid mass and no thyromegaly present.  Cardiovascular: S1 normal and S2 normal. Exam reveals no gallop.  No murmur heard. Respiratory: No respiratory distress. He has decreased breath sounds in the right upper field, the right middle field, the right lower field, the left upper field, the left middle field and the left lower field. He has no wheezes. He has rhonchi in the right upper field, the right middle field, the right lower field, the left upper field, the left middle field and the left lower field. He has no rales.  GI: Soft. Bowel sounds are normal. He exhibits distension. There is no tenderness.  Musculoskeletal:       Right ankle: He exhibits swelling.       Left ankle: He exhibits swelling.  Lymphadenopathy:    He has no cervical adenopathy.  Neurological: He appears lethargic.  Able to wiggle toes and squeeze with his hands.  Skin: Skin  is warm. Nails show no clubbing.  As per nursing staff stage II decubitus on buttock  Psychiatric:  Lethargic      Data Reviewed: Basic Metabolic Panel: Recent Labs  Lab 02/06/18 1702 02/07/18 0455 02/08/18 0409 02/09/18 0558 02/10/18 0509  NA 139 138 138 136 138  K 3.9 4.2 4.3 4.4 4.2  CL 98* 96* 94* 91* 89*  CO2 36* 37* 39* 42* 43*  GLUCOSE 165* 154* 130* 193* 191*  BUN 18 20 22* 31* 35*  CREATININE 0.55* 0.61 0.67 0.78 0.75  CALCIUM 7.8* 8.0* 8.2* 8.0* 8.4*  MG  --   --  1.7 1.8 2.0  PHOS  --   --  4.0 4.1 3.1   Liver Function Tests: Recent Labs  Lab 02/07/18 0455  ALBUMIN 1.6*   CBC: Recent Labs  Lab 02/04/18 0835  02/07/18 0455 02/08/18 0409 02/08/18 1747 02/09/18 0558 02/10/18 0509  WBC 17.4*   < > 14.8* 12.7* 12.3* 11.9* 16.6*  NEUTROABS 12.7*  --  11.8*  --   --   --   --   HGB 8.0*   < > 7.2* 6.9* 7.2* 7.0* 7.4*  HCT 25.2*   < > 22.5* 21.3* 22.2* 20.5* 22.6*  MCV 85.9   < > 85.0 85.3 85.1 84.8 84.1  PLT 452*   < > 480* 443* 389 361 430   < > = values in this interval  not displayed.   BNP (last 3 results) Recent Labs    01/06/18 1944 01/16/18 2332  BNP 37.0 402.0*     CBG: Recent Labs  Lab 02/10/18 0007 02/10/18 0505 02/10/18 0730 02/10/18 1057 02/10/18 1331  GLUCAP 177* 181* 162* 151* 170*    Recent Results (from the past 240 hour(s))  CULTURE, BLOOD (ROUTINE X 2) w Reflex to ID Panel     Status: None   Collection Time: 02/04/18  5:41 PM  Result Value Ref Range Status   Specimen Description BLOOD BLOOD LEFT HAND  Final   Special Requests   Final    BOTTLES DRAWN AEROBIC AND ANAEROBIC Blood Culture adequate volume   Culture   Final    NO GROWTH 5 DAYS Performed at [facility], [address] [place], [place] [address]    Report Status 02/09/2018 FINAL  Final  CULTURE, BLOOD (ROUTINE X 2) w Reflex to ID Panel     Status: None   Collection Time: 02/04/18  8:50 PM  Result Value Ref Range Status   Specimen  Description BLOOD LEFT HAND  Final   Special Requests   Final    BOTTLES DRAWN AEROBIC AND ANAEROBIC Blood Culture adequate volume   Culture   Final    NO GROWTH 5 DAYS Performed at [facility], [address] [place], [place] [address]    Report Status 02/10/2018 FINAL  Final  MRSA PCR Screening     Status: None   Collection Time: 02/05/18  9:38 AM  Result Value Ref Range Status   MRSA by PCR NEGATIVE NEGATIVE Final    Comment:        The GeneXpert MRSA Assay (FDA approved for NASAL specimens only), is one component of a comprehensive MRSA colonization surveillance program. It is not intended to diagnose MRSA infection nor to guide or monitor treatment for MRSA infections. Performed at [facility], [address], [place],  [address]      Studies: Dg Chest [facility] 1 View  Result Date: 02/10/2018 CLINICAL DATA:  Hypoxia. EXAM: PORTABLE CHEST 1 VIEW COMPARISON:  Chest x-ray from same day at 5:23 a.m. FINDINGS: Unchanged tracheostomy tube and right sided PICC line. Stable cardiomediastinal silhouette. Diffuse bilateral pulmonary infiltrates are similar to prior study. No large pleural effusion or pneumothorax. No acute osseous abnormality. IMPRESSION: Diffuse bilateral pulmonary infiltrates, unchanged. Electronically Signed   By: Titus Dubin M.D.   On: 02/10/2018 12:51   Dg Chest Port 1 View  Result Date: 02/10/2018 CLINICAL DATA:  Acute respiratory failure EXAM: PORTABLE CHEST 1 VIEW COMPARISON:  02/09/2018 FINDINGS: Tracheostomy tube and right-sided PICC line are noted in satisfactory position. Diffuse bilateral infiltrates are again identified and stable. No new focal abnormality is noted. IMPRESSION: Stable appearance of bilateral infiltrates. Electronically Signed   By: Inez Catalina M.D.   On: 02/10/2018 07:12   Dg Chest Port 1 View  Result Date: 02/09/2018 CLINICAL DATA:  Sudden onset low stats, known ARDS EXAM: PORTABLE CHEST 1 VIEW COMPARISON:   02/08/2018 FINDINGS: Tracheostomy tube appears stable in position. RIGHT-sided PICC line tip overlies the superior vena cava. The heart size is normal accounting for patient position. There are diffuse opacities throughout the lungs bilaterally which are associated with air bronchograms. More confluent opacities at the bases. Overall the appearance is stable accounting for slight differences in technique. IMPRESSION: Diffuse lung opacities, stable in appearance.  No pneumothorax. Electronically Signed   By: Nolon Nations M.D.   On:  02/09/2018 15:21    Scheduled Meds: . acetylcysteine  2 mL Nebulization Q6H  . amiodarone  400 mg Per Tube BID  . atorvastatin  80 mg Per Tube q1800  . budesonide (PULMICORT) nebulizer solution  0.5 mg Nebulization BID  . chlorhexidine gluconate (MEDLINE KIT)  15 mL Mouth Rinse BID  . clonazePAM  1 mg Per Tube BID  . digoxin  0.25 mg Per Tube Daily  . enoxaparin (LOVENOX) injection  40 mg Subcutaneous Q24H  . FLUoxetine  40 mg Per Tube Daily  . free water  200 mL Per Tube Q8H  . furosemide  40 mg Intravenous Daily  . gabapentin  600 mg Per Tube Q8H  . insulin aspart  0-20 Units Subcutaneous Q4H  . insulin glargine  20 Units Subcutaneous QHS  . ipratropium-albuterol  3 mL Nebulization Q6H  . lisinopril  5 mg Per Tube Daily  . mouth rinse  15 mL Mouth Rinse 10 times per day  . metoCLOPramide  10 mg Oral Q12H  . metolazone  10 mg Oral QPC breakfast  . metoprolol tartrate  25 mg Per Tube Q6H  . nutrition supplement (JUVEN)  1 packet Per Tube BID  . nystatin  5 mL Oral QID  . pantoprazole sodium  40 mg Per Tube Daily  . Valproate Sodium  1,000 mg Per Tube BID   Continuous Infusions: . sodium chloride    . DOPamine Stopped (02/10/18 1052)  . feeding supplement (GLUCERNA 1.5 CAL) 65 mL/hr at 02/10/18 1400  . fentaNYL infusion INTRAVENOUS 400 mcg/hr (02/10/18 1400)  . propofol (DIPRIVAN) infusion 10.023 mcg/kg/min (02/10/18 1400)     Assessment/Plan:  1. Ventilator associated pneumonia.  Sepsis with Klebsiella and blood cultures.  As per infectious disease can continue Ancef antibiotics through 5/13. 2. Acute hypoxic respiratory failure concerning for ARDS.  Patient still on 100% FiO2.  Overall prognosis is poor being on oxygen high FiO2 for over a week.  Patient is still a full code.  Spoke with mother and patient's sister at the bedside. Asked them what to do if the patient's heart stops.  They were not able to give me an answer at this time. 3. Atrial fibrillation with rapid ventricular response and episodes of SVT.  Heart rate improved on amiodarone, digoxin and metoprolol 4. Acute on chronic systolic congestive heart failure with anasarca and scrotal edema on IV Lasix and metolazone 5. Acute encephalopathy with history of traumatic brain injury 6. Recent GI bleed on PPI.  Endoscopy negative Xarelto stopped.  Last hemoglobin 7.4 7. Acute kidney injury.  Has resolved 8. Type 2 diabetes mellitus on glargine insulin and sliding scale 9. Nutrition.  On tube feeds as per intensivist 10. Depression on Depakene 11. Hyperlipidemia unspecified on atorvastatin 12. Stage II decubitus on buttock  Code Status:     Code Status Orders  (From admission, onward)        Start     Ordered   01/06/18 2227  Full code  Continuous     01/06/18 2226    Code Status History    This patient has a current code status but no historical code status.     Family Communication:  mother and sister at the bedside Disposition Plan: To be determined  Consultants:  Critical care specialist  Infectious disease  Antibiotics:  Ancef  Time spent: 28 minutes  [place]

## 2018-02-10 NOTE — Progress Notes (Signed)
PULMONARY / CRITICAL CARE MEDICINE   Name: Jaken Fregia MRN: [id number] DOB: August 05, 1961    ADMISSION DATE:  01/06/2018  HISTORY OF PRESENT ILLNESS:   This 57 year old gentleman has had a long ICU course. He initially presented after cardiac arrest and had been treated for ARDS.  REVIEW OF SYSTEMS:   unattainable  SUBJECTIVE:  Patient denies pain.  VITAL SIGNS: BP (!) 87/48   Pulse 71   Temp 97.8 F (36.6 C) (Axillary)   Resp (!) 28   Ht  (1.778 m)   Wt 256 lb 9.9 oz (116.4 kg)   SpO2 94%   BMI 36.82 kg/m   HEMODYNAMICS:  episode of SVT and desaturation today   VENTILATOR SETTINGS: Vent Mode: PCV FiO2 (%):  [90 %-100 %] 90 % Set Rate:  [24 bmp-28 bmp] 28 bmp PEEP:  [15 cmH20] 15 cmH20 Plateau Pressure:  [40 cmH20] 40 cmH20  INTAKE / OUTPUT: I/O last 3 completed shifts: In: 31326.3 [I.V.:7670.1; NG/GT:2540; IV Piggyback:21116.3] Out: 4625 [Urine:4550; Stool:75]  PHYSICAL EXAMINATION: Constitutional: Restless HENT:  Nose: No mucosal edema.  Mouth/Throat: No oropharyngeal exudate or posterior oropharyngeal edema.  Eyes: Pupils are equal, round, and reactive to light. Conjunctivae, EOM and lids are normal.  Neck: No JVD present. Carotid bruit is not present. No edema present. No thyroid mass and no thyromegaly present.  Cardiovascular: Sinus tachycardia, S1 normal and S2 normal.No MUrmur Respiratory:Diffuse rhonchi,  GI: Soft. Rectal tube in place, diarrhoea. Inaudible BS Musculoskeletal: ankle oedema bilaterally,.  Neurological: awake, restless, follows simple commands, moves all extremities Skin: Skin is warm.Stage 2 decubital ulcer on buttocks  LABS:  BMET Recent Labs  Lab 02/08/18 0409 02/09/18 0558 02/10/18 0509  NA 138 136 138  K 4.3 4.4 4.2  CL 94* 91* 89*  CO2 39* 42* 43*  BUN 22* 31* 35*  CREATININE 0.67 0.78 0.75  GLUCOSE 130* 193* 191*    Electrolytes Recent Labs  Lab 02/08/18 0409 02/09/18 0558 02/10/18 0509  CALCIUM 8.2*  8.0* 8.4*  MG 1.7 1.8 2.0  PHOS 4.0 4.1 3.1    CBC Recent Labs  Lab 02/08/18 1747 02/09/18 0558 02/10/18 0509  WBC 12.3* 11.9* 16.6*  HGB 7.2* 7.0* 7.4*  HCT 22.2* 20.5* 22.6*  PLT 389 361 430    Coag's No results for input(s): APTT, INR in the last 168 hours.  Sepsis Markers No results for input(s): LATICACIDVEN, PROCALCITON, O2SATVEN in the last 168 hours.  ABG Recent Labs  Lab 02/09/18 1612 02/10/18 0418  PHART 7.35 7.49*  PCO2ART 92* 67*  PO2ART 52* 77*    Liver Enzymes Recent Labs  Lab 02/07/18 0455  ALBUMIN 1.6*    Cardiac Enzymes No results for input(s): TROPONINI, PROBNP in the last 168 hours.  Glucose Recent Labs  Lab 02/10/18 0505 02/10/18 0730 02/10/18 1057 02/10/18 1331 02/10/18 1619 02/10/18 1939  GLUCAP 181* 162* 151* 170* 159* 154*    Imaging Dg Chest Port 1 View  Result Date: 02/10/2018 CLINICAL DATA:  Hypoxia. EXAM: PORTABLE CHEST 1 VIEW COMPARISON:  Chest x-ray from same day at 5:23 a.m. FINDINGS: Unchanged tracheostomy tube and right sided PICC line. Stable cardiomediastinal silhouette. Diffuse bilateral pulmonary infiltrates are similar to prior study. No large pleural effusion or pneumothorax. No acute osseous abnormality. IMPRESSION: Diffuse bilateral pulmonary infiltrates, unchanged. Electronically Signed   By: Obie Dredge M.D.   On: 02/10/2018 12:51   Dg Chest Port 1 View  Result Date: 02/10/2018 CLINICAL DATA:  Acute respiratory failure EXAM:  PORTABLE CHEST 1 VIEW COMPARISON:  02/09/2018 FINDINGS: Tracheostomy tube and right-sided PICC line are noted in satisfactory position. Diffuse bilateral infiltrates are again identified and stable. No new focal abnormality is noted. IMPRESSION: Stable appearance of bilateral infiltrates. Electronically Signed   By: Alcide Clever M.D.   On: 02/10/2018 07:12    CULTURES:   ANTIBIOTICS:  5/11 Ancef  SIGNIFICANT EVENTS: 5/13 SVT resolved with Metoprolol 5 mg IV 5/13 Desaturation-  improved with Precedex for sedation  LINES/TUBES: Rectal tube/ foley Trach/ Peg Picc  DISCUSSION: This 57 year old gentleman was admitted with acute hypoxemic respiratory failure, severe bilateral pulmonary infiltrates, severe sepsis/septic shock.  He has had a long ICU course that included ARDS resulting in tracheostomg and Peg tube placement.   ASSESSMENT / PLAN:  1. Ventilator associated pneumonia with Klebsiella and blood cultures.   Plan: continue Ancef as per ID. Cultures repeated today  2. Acute hypoxic respiratory failure with  ARDS and persistent hypoxia. Plan: continue lung protective vent strategy, Sedate to decrease work of breathing and oxygen demand.  3.   Atrial fibrillation with rapid ventricular response and episodes of SVT.   Plan: continue on  amiodarone, digoxin and metoprolol  4. Acute on chronic systolic congestive heart failure with anasarca and scrotal edema  Plan: continue on IV Lasix and metolazone  5. Type 2 diabetes mellitus on glargine insulin and sliding scale Plan: target Glucose management  6. Stage II decubitus on buttock Plan: skin care    FAMILY  - Updates: Wife and daughter updated. Patient remains full code  Thank you for allowing me the privileges to care for this patient.  I have dedicated a total of 40 minutes in critical care time minus all appropriate exclusions.  Jackson Latino, MD Pulmonary and Critical Care Medicine [facility] Pager: [phone]  02/10/2018, 8:43 PM

## 2018-02-10 NOTE — Progress Notes (Signed)
OT Cancellation Note  Patient Details Name: Tommy Mathews MRN: [id number] DOB: 1961/05/19   Cancelled Treatment:    Reason Eval/Treat Not Completed: Patient not medically ready. Chart reviewed for re-attempt to treat pt. Patient remains in medically-fragile state; max ventilatory support with increased sedatives/paralytics required. Unable to tolerate activity/participate with session for 6 consistent attempts.  Will complete order at this time due to medical instability and overall inability to actively participate with session.  Please re-consult as patient medically stable and appropriate for active participation.  Richrd Prime, MPH, MS, OTR/L ascom [phone] 02/10/18, 1:56 PM

## 2018-02-11 DIAGNOSIS — R7881 Bacteremia: Secondary | ICD-10-CM

## 2018-02-11 DIAGNOSIS — J150 Pneumonia due to Klebsiella pneumoniae: Secondary | ICD-10-CM

## 2018-02-11 LAB — CBC WITH DIFFERENTIAL/PLATELET
Basophils Absolute: 0.1 10*3/uL (ref 0–0.1)
Basophils Relative: 0 %
EOS ABS: 0.2 10*3/uL (ref 0–0.7)
Eosinophils Relative: 1 %
HCT: 20.3 % — ABNORMAL LOW (ref 40.0–52.0)
Hemoglobin: 6.4 g/dL — ABNORMAL LOW (ref 13.0–18.0)
LYMPHS ABS: 1.7 10*3/uL (ref 1.0–3.6)
LYMPHS PCT: 12 %
MCH: 26.9 pg (ref 26.0–34.0)
MCHC: 31.6 g/dL — ABNORMAL LOW (ref 32.0–36.0)
MCV: 85.1 fL (ref 80.0–100.0)
MONO ABS: 1.8 10*3/uL — AB (ref 0.2–1.0)
Monocytes Relative: 13 %
Neutro Abs: 10.3 10*3/uL — ABNORMAL HIGH (ref 1.4–6.5)
Neutrophils Relative %: 74 %
PLATELETS: 359 10*3/uL (ref 150–440)
RBC: 2.39 MIL/uL — AB (ref 4.40–5.90)
RDW: 18.5 % — AB (ref 11.5–14.5)
WBC: 13.9 10*3/uL — AB (ref 3.8–10.6)

## 2018-02-11 LAB — COMPREHENSIVE METABOLIC PANEL
ALT: 27 U/L (ref 17–63)
AST: 41 U/L (ref 15–41)
Albumin: 1.7 g/dL — ABNORMAL LOW (ref 3.5–5.0)
Alkaline Phosphatase: 90 U/L (ref 38–126)
Anion gap: 7 (ref 5–15)
BUN: 34 mg/dL — ABNORMAL HIGH (ref 6–20)
CHLORIDE: 91 mmol/L — AB (ref 101–111)
CO2: 42 mmol/L — AB (ref 22–32)
Calcium: 8.6 mg/dL — ABNORMAL LOW (ref 8.9–10.3)
Creatinine, Ser: 0.76 mg/dL (ref 0.61–1.24)
Glucose, Bld: 150 mg/dL — ABNORMAL HIGH (ref 65–99)
POTASSIUM: 3.6 mmol/L (ref 3.5–5.1)
SODIUM: 140 mmol/L (ref 135–145)
Total Bilirubin: 0.4 mg/dL (ref 0.3–1.2)
Total Protein: 6.4 g/dL — ABNORMAL LOW (ref 6.5–8.1)

## 2018-02-11 LAB — GLUCOSE, CAPILLARY
GLUCOSE-CAPILLARY: 151 mg/dL — AB (ref 65–99)
GLUCOSE-CAPILLARY: 140 mg/dL — AB (ref 65–99)
GLUCOSE-CAPILLARY: 143 mg/dL — AB (ref 65–99)
Glucose-Capillary: 137 mg/dL — ABNORMAL HIGH (ref 65–99)
Glucose-Capillary: 163 mg/dL — ABNORMAL HIGH (ref 65–99)
Glucose-Capillary: 153 mg/dL — ABNORMAL HIGH (ref 65–99)

## 2018-02-11 LAB — PREPARE RBC (CROSSMATCH)

## 2018-02-11 LAB — PHOSPHORUS: PHOSPHORUS: 2.2 mg/dL — AB (ref 2.5–4.6)

## 2018-02-11 LAB — MAGNESIUM: Magnesium: 1.8 mg/dL (ref 1.7–2.4)

## 2018-02-11 MED ORDER — SODIUM CHLORIDE 0.9 % IV SOLN
1.0000 g | INTRAVENOUS | Status: DC
  Administered 2018-02-11: 1 g via INTRAVENOUS
  Filled 2018-02-11: qty 1

## 2018-02-11 MED ORDER — FENTANYL CITRATE (PF) 100 MCG/2ML IJ SOLN: 100.0000 ug | Freq: Once | INTRAMUSCULAR | Status: DC | PRN

## 2018-02-11 MED ORDER — ARTIFICIAL TEARS OPHTHALMIC OINT
1.0000 "application " | TOPICAL_OINTMENT | Freq: Three times a day (TID) | OPHTHALMIC | Status: DC
  Administered 2018-02-11 – 2018-02-13 (×6): 1 via OPHTHALMIC
  Filled 2018-02-11: qty 3.5

## 2018-02-11 MED ORDER — BUDESONIDE 0.25 MG/2ML IN SUSP
0.2500 mg | Freq: Four times a day (QID) | RESPIRATORY_TRACT | Status: DC
  Administered 2018-02-11 – 2018-02-17 (×24): 0.25 mg via RESPIRATORY_TRACT
  Filled 2018-02-11 (×24): qty 2

## 2018-02-11 MED ORDER — SODIUM CHLORIDE 0.9 % IV SOLN
0.0000 ug/min | INTRAVENOUS | Status: DC
  Filled 2018-02-11: qty 1

## 2018-02-11 MED ORDER — SODIUM CHLORIDE 0.9 % IV SOLN: 2.0000 mg/h | INTRAVENOUS | Status: DC

## 2018-02-11 MED ORDER — INSULIN GLARGINE 100 UNIT/ML ~~LOC~~ SOLN
40.0000 [IU] | Freq: Every day | SUBCUTANEOUS | Status: DC
  Administered 2018-02-11 – 2018-02-13 (×3): 40 [IU] via SUBCUTANEOUS
  Filled 2018-02-11 (×4): qty 0.4

## 2018-02-11 MED ORDER — CISATRACURIUM BOLUS VIA INFUSION
10.0000 mg | Freq: Once | INTRAVENOUS | Status: AC
  Administered 2018-02-11: 10 mg via INTRAVENOUS
  Filled 2018-02-11: qty 10

## 2018-02-11 MED ORDER — SODIUM CHLORIDE 0.9 % IV SOLN
2.0000 mg/h | INTRAVENOUS | Status: DC
  Administered 2018-02-11: 4 mg/h via INTRAVENOUS
  Administered 2018-02-12: 2 mg/h via INTRAVENOUS
  Administered 2018-02-12: 5 mg/h via INTRAVENOUS
  Filled 2018-02-11 (×3): qty 10

## 2018-02-11 MED ORDER — METOPROLOL TARTRATE 25 MG PO TABS
12.5000 mg | ORAL_TABLET | Freq: Four times a day (QID) | ORAL | Status: DC
  Administered 2018-02-11 – 2018-03-19 (×116): 12.5 mg
  Filled 2018-02-11 (×119): qty 1

## 2018-02-11 MED ORDER — FENTANYL 100 MCG/HR TD PT72
100.0000 ug | MEDICATED_PATCH | TRANSDERMAL | Status: DC
  Administered 2018-02-11 – 2018-02-23 (×6): 100 ug via TRANSDERMAL
  Filled 2018-02-11 (×6): qty 1

## 2018-02-11 MED ORDER — MIDAZOLAM HCL 2 MG/2ML IJ SOLN: 2.0000 mg | Freq: Once | INTRAMUSCULAR | Status: DC | PRN

## 2018-02-11 MED ORDER — ACETAZOLAMIDE SODIUM 500 MG IJ SOLR
500.0000 mg | Freq: Once | INTRAMUSCULAR | Status: AC
  Administered 2018-02-11: 500 mg via INTRAVENOUS
  Filled 2018-02-11: qty 500

## 2018-02-11 MED ORDER — CLONAZEPAM 1 MG PO TABS: 2.0000 mg | ORAL_TABLET | Freq: Two times a day (BID) | ORAL | Status: DC

## 2018-02-11 MED ORDER — MIDAZOLAM HCL 2 MG/2ML IJ SOLN
2.0000 mg | Freq: Once | INTRAMUSCULAR | Status: AC
  Administered 2018-02-11: 2 mg via INTRAVENOUS
  Filled 2018-02-11: qty 2

## 2018-02-11 MED ORDER — PHENYLEPHRINE HCL-NACL 10-0.9 MG/250ML-% IV SOLN
0.0000 ug/min | INTRAVENOUS | Status: DC
Start: 2018-02-11 — End: 2018-02-13
  Filled 2018-02-11: qty 250

## 2018-02-11 MED ORDER — VECURONIUM BROMIDE 10 MG IV SOLR
10.0000 mg | Freq: Once | INTRAVENOUS | Status: AC
Start: 2018-02-11 — End: 2018-02-11
  Filled 2018-02-11: qty 10

## 2018-02-11 MED ORDER — SODIUM CHLORIDE 0.9 % IV SOLN
1.0000 g | Freq: Every day | INTRAVENOUS | Status: DC
  Administered 2018-02-12: 1 g via INTRAVENOUS
  Filled 2018-02-11: qty 1

## 2018-02-11 MED ORDER — METHYLPREDNISOLONE SODIUM SUCC 125 MG IJ SOLR
80.0000 mg | Freq: Two times a day (BID) | INTRAMUSCULAR | Status: DC
  Administered 2018-02-11 – 2018-02-12 (×4): 80 mg via INTRAVENOUS
  Filled 2018-02-11 (×4): qty 2

## 2018-02-11 MED ORDER — FENTANYL 2500MCG IN NS 250ML (10MCG/ML) PREMIX INFUSION
100.0000 ug/h | INTRAVENOUS | Status: DC
  Administered 2018-02-11 – 2018-02-12 (×3): 300 ug/h via INTRAVENOUS
  Administered 2018-02-12: 100 ug/h via INTRAVENOUS
  Administered 2018-02-13: 125 ug/h via INTRAVENOUS
  Filled 2018-02-11 (×4): qty 250

## 2018-02-11 MED ORDER — GABAPENTIN 250 MG/5ML PO SOLN
200.0000 mg | Freq: Three times a day (TID) | ORAL | Status: DC
  Administered 2018-02-11 – 2018-02-23 (×36): 200 mg
  Filled 2018-02-11 (×40): qty 4

## 2018-02-11 MED ORDER — SODIUM CHLORIDE 0.9 % IV SOLN
Freq: Once | INTRAVENOUS | Status: AC
  Administered 2018-02-11: 17:00:00 via INTRAVENOUS

## 2018-02-11 MED ORDER — FENTANYL BOLUS VIA INFUSION
50.0000 ug | INTRAVENOUS | Status: DC | PRN
  Filled 2018-02-11: qty 50

## 2018-02-11 MED ORDER — POTASSIUM CHLORIDE 20 MEQ/15ML (10%) PO SOLN
40.0000 meq | Freq: Two times a day (BID) | ORAL | Status: AC
  Administered 2018-02-11 (×2): 40 meq
  Filled 2018-02-11 (×2): qty 30

## 2018-02-11 MED ORDER — QUETIAPINE FUMARATE 100 MG PO TABS
100.0000 mg | ORAL_TABLET | Freq: Two times a day (BID) | ORAL | Status: DC
Start: 2018-02-11 — End: 2018-02-15
  Administered 2018-02-11 – 2018-02-15 (×9): 100 mg via ORAL
  Filled 2018-02-11 (×9): qty 4

## 2018-02-11 MED ORDER — GLUCERNA 1.5 CAL PO LIQD
1000.0000 mL | ORAL | Status: DC
  Administered 2018-02-11: 1000 mL

## 2018-02-11 MED ORDER — FREE WATER
200.0000 mL | Freq: Three times a day (TID) | Status: DC
  Administered 2018-02-11: 200 mL

## 2018-02-11 MED ORDER — MIDAZOLAM BOLUS VIA INFUSION: 2.0000 mg | INTRAVENOUS | Status: DC | PRN

## 2018-02-11 MED ORDER — FENTANYL CITRATE (PF) 100 MCG/2ML IJ SOLN
25.0000 ug | INTRAMUSCULAR | Status: DC | PRN
  Administered 2018-02-11 (×2): 100 ug via INTRAVENOUS
  Filled 2018-02-11 (×2): qty 2

## 2018-02-11 MED ORDER — ENOXAPARIN SODIUM 40 MG/0.4ML ~~LOC~~ SOLN
40.0000 mg | Freq: Every day | SUBCUTANEOUS | Status: DC
  Administered 2018-02-12 – 2018-02-24 (×13): 40 mg via SUBCUTANEOUS
  Filled 2018-02-11 (×13): qty 0.4

## 2018-02-11 MED ORDER — FENTANYL CITRATE (PF) 100 MCG/2ML IJ SOLN
100.0000 ug | Freq: Once | INTRAMUSCULAR | Status: AC
  Administered 2018-02-11: 100 ug via INTRAVENOUS

## 2018-02-11 MED ORDER — CLONAZEPAM 1 MG PO TABS
2.0000 mg | ORAL_TABLET | Freq: Two times a day (BID) | ORAL | Status: DC
  Administered 2018-02-11 – 2018-02-17 (×13): 2 mg
  Filled 2018-02-11 (×13): qty 2

## 2018-02-11 MED ORDER — SODIUM CHLORIDE 0.9 % IV SOLN
0.5000 ug/kg/min | INTRAVENOUS | Status: DC
  Administered 2018-02-11: 16 ug/kg/min via INTRAVENOUS
  Administered 2018-02-11: 15 ug/kg/min via INTRAVENOUS
  Administered 2018-02-11: 15.5 ug/kg/min via INTRAVENOUS
  Administered 2018-02-11: 3 ug/kg/min via INTRAVENOUS
  Administered 2018-02-12 (×2): 16.5 ug/kg/min via INTRAVENOUS
  Administered 2018-02-12: 15.5 ug/kg/min via INTRAVENOUS
  Administered 2018-02-12 (×2): 5 ug/kg/min via INTRAVENOUS
  Administered 2018-02-12: 16.5 ug/kg/min via INTRAVENOUS
  Administered 2018-02-13 (×2): 8 ug/kg/min via INTRAVENOUS
  Filled 2018-02-11 (×13): qty 20

## 2018-02-11 NOTE — Progress Notes (Signed)
Nutrition Follow-up  DOCUMENTATION CODES:   Obesity unspecified  INTERVENTION:  Continue Glucerna 1.5 Cal at 65 mL/hr (1560 mL goal daily volume). Provides 2340 kcal, 129 grams of protein, 25 grams of fiber, 1186 mL H2O daily.  With current free water flush of 200 mL Q8hrs patient is receiving a total of 1786 mL H2O daily including water in tube feeding.  Continue Juven BID per tube, each supplement provides 80 kcal, 14 grams amino acids, and vitamins/minerals needed for wound healing.  NUTRITION DIAGNOSIS:   Inadequate oral intake related to inability to eat as evidenced by NPO status.  Ongoing - addressing with TF regimen.  GOAL:   Provide needs based on ASPEN/SCCM guidelines  Met with TF regimen.  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   Patient remains on mechanical ventilation through tracheostomy tube. Rectal tube in place. Still pending placement. Per the last social work note on 5/8 it appears the Precedex gtt was a barrier to placement. He is coming off of Precedex today. RD completed another NFPE today. Patient still does not have any subcutaneous fat or muscle wasting.  Access: 20 Fr G-tube with C-clamp placed 4/23; placement verified by relook endoscopy; 4.5 cm at external bumper  MAP: 70-109 mmHg  TF: pt continues to tolerate Glucerna 1.5 at 65 mL/hr; per pump history patient received 1460 mL tube feeds in the past 24 hrs (93.6% goal daily volume)  Patient is currently intubated on ventilator  support Ve: 13.7 L/min Temp (24hrs), Avg:98.2 F (36.8 C), Min:97.8 F (36.6 C), Max:98.5 F (36.9 C)  Propofol: N/A  Medications reviewed and include: clonazepam 2 mg BID per tube, fentanyl patch, free water 200 mL Q8hrs, Novolog 0-20 units Q4hrs (received 25 units past 24 hrs), Lantus 40 units QHS, Solu-Medrol 80 mg Q12hrs IV, Juven BID per tube, nystatin 5 mL QID, Protonix per tube, potassium chloride 40 mEq BID per tube for 2 doses, Seroquel, phenylephrine gtt. Patient was briefly on propofol gtt yesterday but it was stopped a few hours later.  Labs reviewed: CBG 137-170 past 24 hrs, Chloride 91, CO2 42, BUN 34, Phosphorus 2.2. Potassium and Magnesium WNL. Triglycerides were 587 on 5/13.  I/O: 2325 mL UOP yesterday (0.8 mL/kg/hr); 275 mL output in rectal tube  Weight trend: 116.4 kg on 5/13; wt is slowly trending back down as patient was up to 139.5 kg on 4/22; still +9 kg from admission wt of 104.7 kg  Discussed with RN and on rounds. Adjustments were made to sedation and vent settings.  NUTRITION - FOCUSED PHYSICAL EXAM:    Most Recent Value  Orbital Region  No depletion  Upper Arm Region  No depletion  Thoracic and Lumbar Region  No depletion  Buccal Region  No depletion  Temple Region  No depletion  Clavicle Bone Region  No depletion  Clavicle and Acromion Bone Region  No depletion  Scapular Bone Region  Unable to assess  Dorsal Hand  No depletion  Patellar Region  No depletion  Anterior Thigh Region  No depletion  Posterior Calf Region  No depletion  Edema (RD Assessment)  Mild  Hair  Reviewed  Eyes  Unable to assess  Mouth  Unable to assess  Skin  Reviewed  Nails  Reviewed     Diet Order:   Diet Order    None      EDUCATION NEEDS:   Not appropriate for education at this time  Skin:  Skin Assessment: Skin Integrity Issues: Skin Integrity Issues:: Stage II, Other (Comment) Stage II: coccyx Other: open blister right thigh; MSAD to scrotum  Last BM:   02/11/2018 - medium type 7; rectal tube in place  Height:   Ht Readings from Last 1 Encounters:  01/07/18 '5\' 10"'  (1.778 m)    Weight:   Wt Readings from Last 1 Encounters:  02/10/18 256 lb 9.9 oz (116.4 kg)    Ideal Body Weight:  75.5 kg  BMI:  Body mass index is 36.82 kg/m.  Estimated Nutritional Needs:   Kcal:  2295 (PSU 2003b w/ MSJ 1919, Ve 11.9, Tmax 37.7)  Protein:  113-135 grams (1.5-1.8 grams/kg IBW)  Fluid:  1.8 L/day  Willey Blade, MS, RD, LDN Office: [phone] Pager: [phone] After Hours/Weekend Pager: [phone]

## 2018-02-11 NOTE — Progress Notes (Signed)
PULMONARY / CRITICAL CARE MEDICINE   Name: Tommy Mathews MRN: [id number] DOB: [birth year]    ADMISSION DATE:  01/06/2018   PT PROFILE:  46 M SNF resident due to psychiatric illness and history of TBI admitted with acute hypoxemic respiratory failure, severe bilateral pulmonary infiltrates, severe sepsis/septic shock.  Intubated in the ED shortly after arrival.  Was also noted to have dark coffee-ground material suctioned from stomach.  Patient chronically on rivaroxaban.   MAJOR EVENTS/TEST RESULTS: 04/08 Admission as above 04/08 CT chest: Multifocal severe pneumonia with RUL collapse and soft tissue obstructing RUL bronchus.  04/08 CTAP: Mild small bowel ileus, potential enteritis. Nasogastric tube terminates in distal stomach. Cholelithiasis without CT findings of acute cholecystitis.Consider bronchoscopy.  04/09 nephrology consultation: No acute indication for dialysis at present. 04/09 gastroenterology consultation: Recommend high-dose PPI therapy. 04/10 severely hypoxemic despite 100% FiO2.  Recruitment maneuver with significant improvement.  PEEP increased and ARDS strategy on ventilator implemented.  Neuromuscular blockade implemented due to ventilator dyssynchrony. 04/11 PAF with RVR > converted with amiodarone. Gas exchange improving. Off Nimbex 04/12 Echocardiogram: EF 20-25%.  Diffuse HK. 04/13 High airway pressure with vent dyssynchony; given Vecuronium; FIO2 increased to 50%  04/16 Cardiology consultation 04/13 Amiodarone D/C because of SB 04/15 increased HR, EF 20% afib with RVR 04/17 failed weaning trials, HR remains elevated 04/19 ENT consultation for trach tube placement 04/20 no significant changes in the last 24 hours. Has not tolerated any weaning  04/23 PEG placed. 04/23 intermittent ventilator dyssynchrony - sedation regimen adjusted 04/24 Somewhat more responsive. Not F/C 04/25 Trach tube placed 04/25 significantly more responsive 04/30 and hypoxemia and pulmonary  infiltrates 05/02 fever, leukocytosis 05/03 severe ARDS pattern on CXR 05/03 ID consultation 05/14 goals of care discussion with patient's sister and mother.  Agree to continue aggressive support but DNR in event of cardiac arrest    INDWELLING DEVICES: L femoral CVL 04/08 >> 4/16 ETT 04/08 >> 04/25 RUE PICC 4/15 >>  PEG 04/23 >>  Trach tube 04/25 >>   MICRO DATA: MRSA PCR 04/08, 04/20 >> POS, 05/08 >> NEG Urine 04/08 >> 10k coag negative staph Resp 04/09 >> c/w NOF Blood 04/08 >> NEG Blood 04/09 >> NEG Resp 04/20 >> NOF Urine 05/03 >> NEG  Resp 05/03 >> NOF Blood 05/03 >> 2/2 Klebsiella Blood 05/07 >> NEG Resp 05/13 >>     ANTIMICROBIALS:  Unasyn 04/08 >> 04/10 Vanc 04/08 >> 4/15, 05/03 >> 05/10 Cefepime 04/10 >> 4/15 Metronidazole 04/10 >> 4/15 Meropenem 05/03 >> 05/10 Cefazolin 05/11 >> 05/14 Ceftriaxone 05/14 >>    SUBJECTIVE:  Initially minimally responsive on high-dose fentanyl infusion.  This was discontinued.  Subsequently, patient was more responsive but dyssynchronous with ventilator and with desaturations.  Not following commands.  VITAL SIGNS: BP 111/66   Pulse 84   Temp 98.5 F (36.9 C) (Oral)   Resp (!) 28   Ht  (1.778 m)   Wt 256 lb 9.9 oz (116.4 kg)   SpO2 100%   BMI 36.82 kg/m    VENTILATOR SETTINGS: Vent Mode: PRVC FiO2 (%):  [80 %-100 %] 100 % Set Rate:  [28 bmp-35 bmp] 35 bmp Vt Set:  [400 mL] 400 mL PEEP:  [12 cmH20-15 cmH20] 12 cmH20 Plateau Pressure:  [40 cmH20-41 cmH20] 41 cmH20  INTAKE / OUTPUT: I/O last 3 completed shifts: In: 40981 [I.V.:8028.6; NG/GT:2754.1; IV Piggyback:21116.3] Out: 4400 [Urine:4125; Stool:275]  PHYSICAL EXAMINATION: General: RASS -4, not F/C Neuro: Cranial nerves intact, moves all extremities  HEENT: NCAT, sclerae white Cardiovascular: Regular, no M Lungs: Clear anteriorly Abdomen: Nontender, + BS Extremities: Continued improvement in BUE edema, symmetric BLE edema  LABS:  BMET Recent  Labs  Lab 02/09/18 0558 02/10/18 0509 02/11/18 0311  NA 136 138 140  K 4.4 4.2 3.6  CL 91* 89* 91*  CO2 42* 43* 42*  BUN 31* 35* 34*  CREATININE 0.78 0.75 0.76  GLUCOSE 193* 191* 150*    Electrolytes Recent Labs  Lab 02/09/18 0558 02/10/18 0509 02/11/18 0311  CALCIUM 8.0* 8.4* 8.6*  MG 1.8 2.0 1.8  PHOS 4.1 3.1 2.2*    CBC Recent Labs  Lab 02/09/18 0558 02/10/18 0509 02/11/18 0311  WBC 11.9* 16.6* 13.9*  HGB 7.0* 7.4* 6.4*  HCT 20.5* 22.6* 20.3*  PLT 361 430 359    Coag's No results for input(s): APTT, INR in the last 168 hours.  Sepsis Markers No results for input(s): LATICACIDVEN, PROCALCITON, O2SATVEN in the last 168 hours.  ABG Recent Labs  Lab 02/09/18 1612 02/10/18 0418 02/11/18 0500  PHART 7.35 7.49* 7.51*  PCO2ART 92* 67* 63*  PO2ART 52* 77* 66*    Liver Enzymes Recent Labs  Lab 02/07/18 0455 02/11/18 0311  AST  --  41  ALT  --  27  ALKPHOS  --  90  BILITOT  --  0.4  ALBUMIN 1.6* 1.7*    Cardiac Enzymes No results for input(s): TROPONINI, PROBNP in the last 168 hours.  Glucose Recent Labs  Lab 02/10/18 1939 02/10/18 2317 02/11/18 0338 02/11/18 0712 02/11/18 1124 02/11/18 1142  GLUCAP 154* 148* 151* 140* 143* 137*    CXR: ARDS pattern    ASSESSMENT / PLAN: PULMONARY A: Acute hypoxemic respiratory failure Aspiration pneumonia ARDS, recurrent Pulmonary edema Prolonged mechanical ventilation Tracheostomy tube status P:   Cont full vent support - settings reviewed and adjusted Cont vent bundle Daily SBT if/when meets criteria  Continue nebulized bronchodilators Discontinue amiodarone Empiric methylprednisolone for possible amiodarone induced lung injury  CARDIOVASCULAR A: septic shock, resolved Paroxysmal atrial fibrillation Dilated cardiomyopathy, etiology unclear Hypertension Intermittent sinus tachycardia P:  MAP goal > 65 mmHg Discontinue amiodarone as it might be contributing to lung injury Resume  hydrochlorothiazide at reduced dose Resume lisinopril Continue scheduled metoprolol Continue hydralazine as needed to maintain SBP < 160 mmHg  RENAL A:   AKI, resolved Hypernatremia, resolved Severe hypervolemia/anasarca Metabolic alkalosis due to loop diuresis P: Monitor BMET intermittently Monitor I/Os Correct electrolytes as indicated  Continue free water per G-tube Repeat acetazolamide x 1 dose 05/14  GASTROINTESTINAL A:   Abdominal distention, resolved Suspected UGIB, resolved G tube status P:   SUP: Enteral PPI Continue TF protocol  HEMATOLOGIC A: ICU acquired anemia No current evidence of acute blood loss P: DVT px: enoxaparin Monitor CBC intermittently  Transfuse per usual guidelines   INFECTIOUS A:   Severe sepsis with shock, resolved Aspiration pneumonia, treated Klebsiella pneumonia with bacteremia P:   Monitor temp, WBC count Micro and abx as above   ENDOCRINE A:   Type 2 diabetes, controlled P:   Continue Lantus - increased 05/14 with initiation of methylpred Continue resistant scale SSI  NEUROLOGIC A:   Ventilator dyssynchrony ICU/ventilator associated discomfort History of schizophrenia History of TBI P:   RASS goal: -2, -3 Increase clonazepam Resume Duragesic patch 05/14 Resume quetiapine 05/14 Continue intermittent Lorazepam and fentanyl   Goals of care discussion with patient's sister and mother.  Agree to continue aggressive support but DNR in event of cardiac arrest  CCM time: 60 mins The above time includes time spent in consultation with patient and/or family members and reviewing care plan on multidisciplinary rounds  Billy Fischer, MD PCCM service Mobile [phone] Pager [phone] 02/11/2018 2:04 PM

## 2018-02-11 NOTE — Progress Notes (Signed)
Patient ID: Tommy Mathews, male   DOB: 03/25/1961, 57 y.o.   MRN: [id number]  [facility] PROGRESS NOTE  Tommy Mathews AGT:[id number] DOB: Feb 02, 1961 DOA: 01/06/2018 PCP: Isaias Cowman, MD  HPI/Subjective: Patient less responsive today.  Nursing staff had trouble ventilating him earlier when they stop sedation.  When I saw him he was on 100% FiO2.  Objective: Vitals:   02/11/18 1050 02/11/18 1418  BP:  (!) 162/86  Pulse: 84 94  Resp:    Temp:    SpO2:      Filed Weights   01/20/18 1200 01/26/18 0418 02/10/18 0500  Weight: (!) 139.5 kg (307 lb 8.7 oz) 133.2 kg (293 lb 10.4 oz) 116.4 kg (256 lb 9.9 oz)    ROS: Review of Systems  Unable to perform ROS: Acuity of condition   Exam: Physical Exam  Constitutional: He appears lethargic.  HENT:  Nose: No mucosal edema.  Mouth/Throat: No oropharyngeal exudate or posterior oropharyngeal edema.  Eyes: Pupils are equal, round, and reactive to light. Conjunctivae, EOM and lids are normal.  Neck: No JVD present. Carotid bruit is not present. No edema present. No thyroid mass and no thyromegaly present.  Cardiovascular: S1 normal and S2 normal. Exam reveals no gallop.  No murmur heard. Respiratory: No respiratory distress. He has decreased breath sounds in the right upper field, the right middle field, the right lower field, the left upper field, the left middle field and the left lower field. He has no wheezes. He has rhonchi in the right upper field, the right middle field, the right lower field, the left upper field, the left middle field and the left lower field. He has no rales.  GI: Soft. Bowel sounds are normal. He exhibits distension. There is no tenderness.  Musculoskeletal:       Right ankle: He exhibits swelling.       Left ankle: He exhibits swelling.  Lymphadenopathy:    He has no cervical adenopathy.  Neurological: He appears lethargic.  Skin: Skin is warm. Nails show no clubbing.  As per nursing staff stage II  decubitus on buttock  Psychiatric:  Lethargic      Data Reviewed: Basic Metabolic Panel: Recent Labs  Lab 02/07/18 0455 02/08/18 0409 02/09/18 0558 02/10/18 0509 02/11/18 0311  NA 138 138 136 138 140  K 4.2 4.3 4.4 4.2 3.6  CL 96* 94* 91* 89* 91*  CO2 37* 39* 42* 43* 42*  GLUCOSE 154* 130* 193* 191* 150*  BUN 20 22* 31* 35* 34*  CREATININE 0.61 0.67 0.78 0.75 0.76  CALCIUM 8.0* 8.2* 8.0* 8.4* 8.6*  MG  --  1.7 1.8 2.0 1.8  PHOS  --  4.0 4.1 3.1 2.2*   Liver Function Tests: Recent Labs  Lab 02/07/18 0455 02/11/18 0311  AST  --  41  ALT  --  27  ALKPHOS  --  90  BILITOT  --  0.4  PROT  --  6.4*  ALBUMIN 1.6* 1.7*   CBC: Recent Labs  Lab 02/07/18 0455 02/08/18 0409 02/08/18 1747 02/09/18 0558 02/10/18 0509 02/11/18 0311  WBC 14.8* 12.7* 12.3* 11.9* 16.6* 13.9*  NEUTROABS 11.8*  --   --   --   --  10.3*  HGB 7.2* 6.9* 7.2* 7.0* 7.4* 6.4*  HCT 22.5* 21.3* 22.2* 20.5* 22.6* 20.3*  MCV 85.0 85.3 85.1 84.8 84.1 85.1  PLT 480* 443* 389 361 430 359   BNP (last 3 results) Recent Labs    01/06/18 1944 01/16/18 2332  BNP 37.0 402.0*     CBG: Recent Labs  Lab 02/10/18 2317 02/11/18 0338 02/11/18 0712 02/11/18 1124 02/11/18 1142  GLUCAP 148* 151* 140* 143* 137*    Recent Results (from the past 240 hour(s))  CULTURE, BLOOD (ROUTINE X 2) w Reflex to ID Panel     Status: None   Collection Time: 02/04/18  5:41 PM  Result Value Ref Range Status   Specimen Description BLOOD BLOOD LEFT HAND  Final   Special Requests   Final    BOTTLES DRAWN AEROBIC AND ANAEROBIC Blood Culture adequate volume   Culture   Final    NO GROWTH 5 DAYS Performed at [facility], [address] [place], [place] [address]    Report Status 02/09/2018 FINAL  Final  CULTURE, BLOOD (ROUTINE X 2) w Reflex to ID Panel     Status: None   Collection Time: 02/04/18  8:50 PM  Result Value Ref Range Status   Specimen Description BLOOD LEFT HAND  Final   Special Requests    Final    BOTTLES DRAWN AEROBIC AND ANAEROBIC Blood Culture adequate volume   Culture   Final    NO GROWTH 5 DAYS Performed at [facility], [address] [place], [place] [address]    Report Status 02/10/2018 FINAL  Final  MRSA PCR Screening     Status: None   Collection Time: 02/05/18  9:38 AM  Result Value Ref Range Status   MRSA by PCR NEGATIVE NEGATIVE Final    Comment:        The GeneXpert MRSA Assay (FDA approved for NASAL specimens only), is one component of a comprehensive MRSA colonization surveillance program. It is not intended to diagnose MRSA infection nor to guide or monitor treatment for MRSA infections. Performed at [facility], [address] [place], [place] [address]   Culture, respiratory (NON-Expectorated)     Status: None (Preliminary result)   Collection Time: 02/10/18 12:02 PM  Result Value Ref Range Status   Specimen Description   Final    TRACHEAL ASPIRATE Performed at [facility], [address], [place], [place] [address]    Special Requests   Final    NONE Performed at [facility], [address] [place], [place] [address]    Gram Stain   Final    MODERATE WBC PRESENT,BOTH PMN AND MONONUCLEAR NO ORGANISMS SEEN    Culture   Final    CULTURE REINCUBATED FOR BETTER GROWTH Performed at [facility], [place] [address] [place], [place] [address]    Report Status PENDING  Incomplete     Studies: Dg Chest Port 1 View  Result Date: 02/10/2018 CLINICAL DATA:  Hypoxia. EXAM: PORTABLE CHEST 1 VIEW COMPARISON:  Chest x-ray from same day at 5:23 a.m. FINDINGS: Unchanged tracheostomy tube and right sided PICC line. Stable cardiomediastinal silhouette. Diffuse bilateral pulmonary infiltrates are similar to prior study. No large pleural effusion or pneumothorax. No acute osseous abnormality. IMPRESSION: Diffuse bilateral pulmonary infiltrates, unchanged. Electronically Signed   By: Titus Dubin M.D.    On: 02/10/2018 12:51   Dg Chest Port 1 View  Result Date: 02/10/2018 CLINICAL DATA:  Acute respiratory failure EXAM: PORTABLE CHEST 1 VIEW COMPARISON:  02/09/2018 FINDINGS: Tracheostomy tube and right-sided PICC line are noted in satisfactory position. Diffuse bilateral infiltrates are again identified and stable. No new focal abnormality is noted. IMPRESSION: Stable appearance of bilateral infiltrates. Electronically Signed   By: Linus Mako.D.  On: 02/10/2018 07:12    Scheduled Meds: . budesonide (PULMICORT) nebulizer solution  0.25 mg Nebulization Q6H  . chlorhexidine gluconate (MEDLINE KIT)  15 mL Mouth Rinse BID  . clonazePAM  2 mg Per Tube BID  . digoxin  0.25 mg Per Tube Daily  . [START ON 02/12/2018] enoxaparin (LOVENOX) injection  40 mg Subcutaneous Daily  . fentaNYL  100 mcg Transdermal Q72H  . free water  200 mL Per Tube Q8H  . gabapentin  200 mg Per Tube Q8H  . insulin aspart  0-20 Units Subcutaneous Q4H  . insulin glargine  40 Units Subcutaneous QHS  . ipratropium-albuterol  3 mL Nebulization Q6H  . mouth rinse  15 mL Mouth Rinse 10 times per day  . methylPREDNISolone (SOLU-MEDROL) injection  80 mg Intravenous Q12H  . metoprolol tartrate  12.5 mg Per Tube Q6H  . nutrition supplement (JUVEN)  1 packet Per Tube BID  . nystatin  5 mL Oral QID  . pantoprazole sodium  40 mg Per Tube Daily  . potassium chloride  40 mEq Per Tube BID  . QUEtiapine  100 mg Oral BID  . Valproate Sodium  1,000 mg Per Tube BID  . vecuronium  10 mg Intravenous Once   Continuous Infusions: . sodium chloride    . [START ON 02/12/2018] cefTRIAXone (ROCEPHIN)  IV    . feeding supplement (GLUCERNA 1.5 CAL) 65 mL/hr at 02/11/18 0613  . phenylephrine (NEO-SYNEPHRINE) Adult infusion      Assessment/Plan:  1. Acute hypoxic respiratory failure concerning for ARDS.  Patient still on 100% FiO2.  Overall prognosis is poor being on oxygen high FiO2 for over a week.  Patient made a DO NOT RESUSCITATE by  critical care specialist.  Started on Solu-Medrol.  Given 1 dose of Diamox. 2. Ventilator associated pneumonia with sepsis with Klebsiella.  Finished antibiotics. 3. Atrial fibrillation with rapid ventricular response and episodes of SVT.  Heart rate improved on amiodarone, digoxin and metoprolol. 4. Acute on chronic systolic congestive heart failure with anasarca.  given dose of Diamox today. 5. Acute encephalopathy with history of traumatic brain injury. 6. Recent GI bleed on PPI.  Endoscopy negative Xarelto stopped.  Last hemoglobin 6.4. 7. Acute kidney injury.  Has resolved 8. Type 2 diabetes mellitus on glargine insulin and sliding scale 9. Nutrition.  On tube feeds as per intensivist 10. Depression on Depakene 11. Hyperlipidemia unspecified on atorvastatin 12. Stage II decubitus on buttock  Code Status:     Code Status Orders  (From admission, onward)        Start     Ordered   01/06/18 2227  Full code  Continuous     01/06/18 2226    Code Status History    This patient has a current code status but no historical code status.     Family Communication:  mother and sister at the bedside Disposition Plan: To be determined  Consultants:  Critical care specialist  Infectious disease  Time spent: 28 minutes, case discussed with critical care specialist.  [facility]         Patient ID: Tommy Mathews, male   DOB: 1961/08/21, 57 y.o.   MRN: [id number]

## 2018-02-11 NOTE — Progress Notes (Signed)
[facility] INFECTIOUS DISEASE PROGRESS NOTE Date of Admission:  01/06/2018     ID: Tommy Mathews is a 57 y.o. male with fever Principal Problem:   Severe sepsis with septic shock ([place]) Active Problems:   GI bleed   Acute respiratory failure with hypoxia (HCC)   Multifocal pneumonia   UTI (urinary tract infection)   AKI (acute kidney injury) ([place])   Ileus (HCC)   Diabetes ([place])   Pressure injury of skin   Subjective: No fevers, wbc down a little  ROS  Unable to obtain Medications:  Antibiotics Given (last 72 hours)    Date/Time Action Medication Dose Rate   02/08/18 2029 New Bag/Given   ceFAZolin (ANCEF) IVPB 2g/100 mL premix 2 g 200 mL/hr   02/09/18 0429 New Bag/Given   ceFAZolin (ANCEF) IVPB 2g/100 mL premix 2 g 200 mL/hr   02/09/18 1233 New Bag/Given   ceFAZolin (ANCEF) IVPB 2g/100 mL premix 2 g 200 mL/hr   02/09/18 1950 New Bag/Given   ceFAZolin (ANCEF) IVPB 2g/100 mL premix 2 g 200 mL/hr   02/10/18 0455 New Bag/Given   ceFAZolin (ANCEF) IVPB 2g/100 mL premix 2 g 200 mL/hr     . budesonide (PULMICORT) nebulizer solution  0.25 mg Nebulization Q6H  . chlorhexidine gluconate (MEDLINE KIT)  15 mL Mouth Rinse BID  . clonazePAM  2 mg Per Tube BID  . digoxin  0.25 mg Per Tube Daily  . enoxaparin (LOVENOX) injection  40 mg Subcutaneous Q24H  . fentaNYL  100 mcg Transdermal Q72H  . free water  200 mL Per Tube Q8H  . gabapentin  200 mg Per Tube Q8H  . insulin aspart  0-20 Units Subcutaneous Q4H  . insulin glargine  40 Units Subcutaneous QHS  . ipratropium-albuterol  3 mL Nebulization Q6H  . mouth rinse  15 mL Mouth Rinse 10 times per day  . methylPREDNISolone (SOLU-MEDROL) injection  80 mg Intravenous Q12H  . metoprolol tartrate  12.5 mg Per Tube Q6H  . nutrition supplement (JUVEN)  1 packet Per Tube BID  . nystatin  5 mL Oral QID  . pantoprazole sodium  40 mg Per Tube Daily  . potassium chloride  40 mEq Per Tube BID  . QUEtiapine  100 mg Oral BID  . Valproate  Sodium  1,000 mg Per Tube BID    Objective: Vital signs in last 24 hours: Temp:  [97.8 F (36.6 C)-98.5 F (36.9 C)] 98.5 F (36.9 C) (05/13 2000) Pulse Rate:  [54-90] 84 (05/14 1050) Resp:  [22-28] 28 (05/14 0900) BP: (85-166)/(45-92) 111/66 (05/14 0900) SpO2:  [90 %-100 %] 100 % (05/14 0900) FiO2 (%):  [80 %-100 %] 100 % (05/14 1100) Constitutional: lethargic, HENT: trach in place, anicteric Mouth/Throat: Oropharynx is clear and moist.  Cardiovascular: Normal rate, regular rhythm and normal heart sounds.  Pulmonary/Chest: rhonchi bil Abdominal: Soft. Bowel sounds are normal. He exhibits no distension. There is no tenderness.  PEG Site wnl Lymphadenopathy: He has no cervical adenopathy.  Neurological: lethargic Skin: Skin is warm and dry. No rash noted. No erythema.  Psychiatric: trached PICC RUE WNL    Lab Results Recent Labs    02/10/18 0509 02/11/18 0311  WBC 16.6* 13.9*  HGB 7.4* 6.4*  HCT 22.6* 20.3*  NA 138 140  K 4.2 3.6  CL 89* 91*  CO2 43* 42*  BUN 35* 34*  CREATININE 0.75 0.76    Microbiology: Results for orders placed or performed during the hospital encounter of 01/06/18  Blood Culture (  routine x 2)     Status: None   Collection Time: 01/06/18  7:44 PM  Result Value Ref Range Status   Specimen Description BLOOD BLOOD LEFT WRIST  Final   Special Requests   Final    BOTTLES DRAWN AEROBIC AND ANAEROBIC Blood Culture adequate volume   Culture   Final    NO GROWTH 5 DAYS Performed at [facility], [address], [place], [place] [address]    Report Status 01/11/2018 FINAL  Final  Urine culture     Status: Abnormal   Collection Time: 01/06/18  7:44 PM  Result Value Ref Range Status   Specimen Description   Final    URINE, RANDOM Performed at [facility], [address], [place], [place] [address]    Special Requests   Final    NONE Performed at [facility], [address], [place], [place] [address]    Culture  (A)  Final    10,000 COLONIES/mL STAPHYLOCOCCUS SPECIES (COAGULASE NEGATIVE) CALL MICROBIOLOGY LAB IF SENSITIVITIES ARE REQUIRED. Performed at [facility], [place] [address], [place], [place] [address]    Report Status 01/08/2018 FINAL  Final  MRSA PCR Screening     Status: Abnormal   Collection Time: 01/06/18 11:51 PM  Result Value Ref Range Status   MRSA by PCR POSITIVE (A) NEGATIVE Final    Comment:        The GeneXpert MRSA Assay (FDA approved for NASAL specimens only), is one component of a comprehensive MRSA colonization surveillance program. It is not intended to diagnose MRSA infection nor to guide or monitor treatment for MRSA infections. RESULT CALLED TO, READ BACK BY AND VERIFIED WITH: BETH BUONO 01/07/18 @ 0121  [place]   Performed at [facility], [address] [place], [place] [address]   Blood Culture (routine x 2)     Status: None   Collection Time: 01/07/18  7:19 AM  Result Value Ref Range Status   Specimen Description BLOOD RIGHT HAND  Final   Special Requests   Final    BOTTLES DRAWN AEROBIC AND ANAEROBIC Blood Culture results may not be optimal due to an inadequate volume of blood received in culture bottles   Culture   Final    NO GROWTH 5 DAYS Performed at [facility], [address], [place], [place] [address]    Report Status 01/12/2018 FINAL  Final  Culture, respiratory (NON-Expectorated)     Status: None   Collection Time: 01/07/18 12:28 PM  Result Value Ref Range Status   Specimen Description   Final    TRACHEAL ASPIRATE Performed at [facility], [address], [place], [place] [address]    Special Requests   Final    Normal Performed at [facility], [address] [place], [place] [address]    Gram Stain   Final    MODERATE WBC PRESENT,BOTH PMN AND MONONUCLEAR RARE GRAM POSITIVE COCCI RARE YEAST    Culture   Final    Consistent with normal respiratory flora. Performed at [facility], [place] [address], [place],  [address]    Report Status 01/09/2018 FINAL  Final  Culture, expectorated sputum-assessment     Status: None   Collection Time: 01/18/18  5:07 PM  Result Value Ref Range Status   Specimen Description ENDOTRACHEAL  Final   Special Requests NONE  Final   Sputum evaluation   Final    THIS SPECIMEN IS ACCEPTABLE  FOR SPUTUM CULTURE Performed at [facility], [address] [place], [place] [address]    Report Status 01/18/2018 FINAL  Final  Culture, respiratory (NON-Expectorated)     Status: None   Collection Time: 01/18/18  5:07 PM  Result Value Ref Range Status   Specimen Description   Final    ENDOTRACHEAL Performed at [facility], [address] [place], [place] [address]    Special Requests   Final    NONE Reflexed from [id number] Performed at [facility], [address] [place], [place] [address]    Gram Stain   Final    MODERATE WBC PRESENT,BOTH PMN AND MONONUCLEAR NO SQUAMOUS EPITHELIAL CELLS SEEN RARE BUDDING YEAST SEEN    Culture   Final    RARE Consistent with normal respiratory flora. Performed at [facility], [place] [address], [place], [place] [address]    Report Status 01/21/2018 FINAL  Final  MRSA PCR Screening     Status: Abnormal   Collection Time: 01/18/18  8:26 PM  Result Value Ref Range Status   MRSA by PCR POSITIVE (A) NEGATIVE Final    Comment:        The GeneXpert MRSA Assay (FDA approved for NASAL specimens only), is one component of a comprehensive MRSA colonization surveillance program. It is not intended to diagnose MRSA infection nor to guide or monitor treatment for MRSA infections. RESULT CALLED TO, READ BACK BY AND VERIFIED WITH: ANGELA BREHUN AT 2200 ON 01/18/18 RWW Performed at [facility], [address] [place], [place] [address]   CULTURE, BLOOD (ROUTINE X 2) w Reflex to ID Panel     Status: Abnormal   Collection Time: 01/31/18  8:17 AM  Result Value Ref  Range Status   Specimen Description   Final    BLOOD LEFT HAND Performed at [facility], [place] [address], [place], [place] [address]    Special Requests   Final    BOTTLES DRAWN AEROBIC AND ANAEROBIC Blood Culture results may not be optimal due to an excessive volume of blood received in culture bottles Performed at [facility], [address] [place],  [address]    Culture  Setup Time   Final    GRAM NEGATIVE RODS AEROBIC BOTTLE ONLY CRITICAL RESULT CALLED TO, READ BACK BY AND VERIFIED WITH: Lynnea Vandervoort BESANTI ON 02/05/18 AT 0228 JAG Performed at [facility], [place] [address] [place], [place] [address]    Culture KLEBSIELLA PNEUMONIAE (A)  Final   Report Status 02/07/2018 FINAL  Final   Organism ID, Bacteria KLEBSIELLA PNEUMONIAE  Final      Susceptibility   Klebsiella pneumoniae - MIC*    AMPICILLIN >=32 RESISTANT Resistant     CEFAZOLIN <=4 SENSITIVE Sensitive     CEFEPIME <=1 SENSITIVE Sensitive     CEFTAZIDIME <=1 SENSITIVE Sensitive     CEFTRIAXONE <=1 SENSITIVE Sensitive     CIPROFLOXACIN <=0.25 SENSITIVE Sensitive     GENTAMICIN <=1 SENSITIVE Sensitive     IMIPENEM <=0.25 SENSITIVE Sensitive     TRIMETH/SULFA <=20 SENSITIVE Sensitive     AMPICILLIN/SULBACTAM >=32 RESISTANT Resistant     PIP/TAZO 16 SENSITIVE Sensitive     Extended ESBL NEGATIVE Sensitive     * KLEBSIELLA PNEUMONIAE  Blood Culture ID Panel (Reflexed)     Status: Abnormal   Collection Time: 01/31/18  8:17 AM  Result Value Ref Range Status   Enterococcus species NOT DETECTED NOT DETECTED Final  Listeria monocytogenes NOT DETECTED NOT DETECTED Final   Staphylococcus species NOT DETECTED NOT DETECTED Final   Staphylococcus aureus NOT DETECTED NOT DETECTED Final   Streptococcus species NOT DETECTED NOT DETECTED Final   Streptococcus agalactiae NOT DETECTED NOT DETECTED Final   Streptococcus pneumoniae NOT DETECTED NOT DETECTED Final   Streptococcus pyogenes NOT DETECTED NOT  DETECTED Final   Acinetobacter baumannii NOT DETECTED NOT DETECTED Final   Enterobacteriaceae species DETECTED (A) NOT DETECTED Final    Comment: Enterobacteriaceae represent a large family of gram-negative bacteria, not a single organism. CRITICAL RESULT CALLED TO, READ BACK BY AND VERIFIED WITH: Marshall Kampf BESANTI ON 02/05/18 AT 0228 JAG    Enterobacter cloacae complex NOT DETECTED NOT DETECTED Final   Escherichia coli NOT DETECTED NOT DETECTED Final   Klebsiella oxytoca NOT DETECTED NOT DETECTED Final   Klebsiella pneumoniae DETECTED (A) NOT DETECTED Final    Comment: CRITICAL RESULT CALLED TO, READ BACK BY AND VERIFIED WITH: Nikiyah Fackler BESANTI ON 02/05/18 AT 0228 JAG    Proteus species NOT DETECTED NOT DETECTED Final   Serratia marcescens NOT DETECTED NOT DETECTED Final   Carbapenem resistance NOT DETECTED NOT DETECTED Final   Haemophilus influenzae NOT DETECTED NOT DETECTED Final   Neisseria meningitidis NOT DETECTED NOT DETECTED Final   Pseudomonas aeruginosa NOT DETECTED NOT DETECTED Final   Candida albicans NOT DETECTED NOT DETECTED Final   Candida glabrata NOT DETECTED NOT DETECTED Final   Candida krusei NOT DETECTED NOT DETECTED Final   Candida parapsilosis NOT DETECTED NOT DETECTED Final   Candida tropicalis NOT DETECTED NOT DETECTED Final    Comment: Performed at [facility], [address], [place], [place] [address]  Urine Culture     Status: None   Collection Time: 01/31/18  8:30 AM  Result Value Ref Range Status   Specimen Description   Final    URINE, RANDOM Performed at [facility], [address], [place], [place] [address]    Special Requests   Final    NONE Performed at [facility], [address], [place], [place] [address]    Culture   Final    NO GROWTH Performed at [facility], [place] [address] [place], [place] [address]    Report Status 02/01/2018 FINAL  Final  CULTURE, BLOOD (ROUTINE X 2) w Reflex to ID Panel      Status: Abnormal   Collection Time: 01/31/18  8:53 AM  Result Value Ref Range Status   Specimen Description   Final    BLOOD BLOOD RIGHT HAND Performed at [facility], [address] [place], [place] [address]    Special Requests   Final    BOTTLES DRAWN AEROBIC AND ANAEROBIC Blood Culture adequate volume Performed at [facility], [address], [place], [place] [address]    Culture  Setup Time   Final    GRAM NEGATIVE RODS AEROBIC BOTTLE ONLY CRITICAL RESULT CALLED TO, READ BACK BY AND VERIFIED WITH: Avenir Lozinski BESANTI AT 9629 ON 02/05/18 [place]. Performed at [facility], [address] [place], [place] [address]    Culture (A)  Final    KLEBSIELLA PNEUMONIAE SUSCEPTIBILITIES PERFORMED ON PREVIOUS CULTURE WITHIN THE LAST 5 DAYS. Performed at [facility], [place] [address] [place],  [address]    Report Status 02/07/2018 FINAL  Final  Culture, respiratory (NON-Expectorated)     Status: None   Collection Time: 01/31/18 10:47 AM  Result Value Ref Range Status  Specimen Description   Final    TRACHEAL ASPIRATE Performed at [facility], [address] [place], [place] [address]    Special Requests   Final    NONE Performed at [facility], [address] [place], [place] [address]    Gram Stain   Final    MODERATE WBC PRESENT, PREDOMINANTLY PMN MODERATE GRAM POSITIVE COCCI MODERATE GRAM POSITIVE RODS    Culture   Final    Consistent with normal respiratory flora. Performed at [facility], [place] [address] [place], [place] [address]    Report Status 02/02/2018 FINAL  Final  CULTURE, BLOOD (ROUTINE X 2) w Reflex to ID Panel     Status: None   Collection Time: 02/04/18  5:41 PM  Result Value Ref Range Status   Specimen Description BLOOD BLOOD LEFT HAND  Final   Special Requests   Final    BOTTLES DRAWN AEROBIC AND ANAEROBIC Blood Culture adequate volume   Culture   Final    NO GROWTH 5 DAYS Performed at  [facility], [address] [place], [place] [address]    Report Status 02/09/2018 FINAL  Final  CULTURE, BLOOD (ROUTINE X 2) w Reflex to ID Panel     Status: None   Collection Time: 02/04/18  8:50 PM  Result Value Ref Range Status   Specimen Description BLOOD LEFT HAND  Final   Special Requests   Final    BOTTLES DRAWN AEROBIC AND ANAEROBIC Blood Culture adequate volume   Culture   Final    NO GROWTH 5 DAYS Performed at [facility], [address] [place], [place] [address]    Report Status 02/10/2018 FINAL  Final  MRSA PCR Screening     Status: None   Collection Time: 02/05/18  9:38 AM  Result Value Ref Range Status   MRSA by PCR NEGATIVE NEGATIVE Final    Comment:        The GeneXpert MRSA Assay (FDA approved for NASAL specimens only), is one component of a comprehensive MRSA colonization surveillance program. It is not intended to diagnose MRSA infection nor to guide or monitor treatment for MRSA infections. Performed at [facility], [address] [place], [place] [address]   Culture, respiratory (NON-Expectorated)     Status: None (Preliminary result)   Collection Time: 02/10/18 12:02 PM  Result Value Ref Range Status   Specimen Description   Final    TRACHEAL ASPIRATE Performed at [facility], [address] [place], [place] [address]    Special Requests   Final    NONE Performed at [facility], [address] [place], [place] [address]    Gram Stain   Final    MODERATE WBC PRESENT,BOTH PMN AND MONONUCLEAR NO ORGANISMS SEEN    Culture   Final    CULTURE REINCUBATED FOR BETTER GROWTH Performed at [facility], [place] [address], [place], [place] [address]    Report Status PENDING  Incomplete    Studies/Results: Dg Chest Port 1 View  Result Date: 02/10/2018 CLINICAL DATA:  Hypoxia. EXAM: PORTABLE CHEST 1 VIEW COMPARISON:  Chest x-ray from same day at 5:23 a.m. FINDINGS: Unchanged tracheostomy  tube and right sided PICC line. Stable cardiomediastinal silhouette. Diffuse bilateral pulmonary infiltrates are similar to prior study. No large pleural effusion or pneumothorax. No acute osseous abnormality. IMPRESSION: Diffuse bilateral pulmonary infiltrates, unchanged. Electronically Signed   By: Titus Dubin M.D.   On: 02/10/2018  12:51   Dg Chest Port 1 View  Result Date: 02/10/2018 CLINICAL DATA:  Acute respiratory failure EXAM: PORTABLE CHEST 1 VIEW COMPARISON:  02/09/2018 FINDINGS: Tracheostomy tube and right-sided PICC line are noted in satisfactory position. Diffuse bilateral infiltrates are again identified and stable. No new focal abnormality is noted. IMPRESSION: Stable appearance of bilateral infiltrates. Electronically Signed   By: Inez Catalina M.D.   On: 02/10/2018 07:12   Dg Chest Port 1 View  Result Date: 02/09/2018 CLINICAL DATA:  Sudden onset low stats, known ARDS EXAM: PORTABLE CHEST 1 VIEW COMPARISON:  02/08/2018 FINDINGS: Tracheostomy tube appears stable in position. RIGHT-sided PICC line tip overlies the superior vena cava. The heart size is normal accounting for patient position. There are diffuse opacities throughout the lungs bilaterally which are associated with air bronchograms. More confluent opacities at the bases. Overall the appearance is stable accounting for slight differences in technique. IMPRESSION: Diffuse lung opacities, stable in appearance.  No pneumothorax. Electronically Signed   By: Nolon Nations M.D.   On: 02/09/2018 15:21    Assessment/Plan: Derrico Zhong is a 57 y.o. 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 He has had a prolonged admission and is now trached and has PEG. He was treated with IV abx until 4/15 then again 4/20-21. Was then off of abx until developed fever 5/2 with wbc up some to 12. CXR however did not show any new opacities but has  increasing resp requirements and increased sputum.  PICC line RUE changed. Prior sputum cx with Nml flora 4/20. Prior bcx neg. FU BCX and sputum pending.  5/6- defervesced, bcx neg, sputum cx negative. WBC down 17->12. Day 4 of vanco and mero. Imaging shows no change in severe patchy opacities bilaterally.  5/8 had fever and increased wbc. cx now + klebsiellafrom 5/3 5/10 - no fevers, cx with Kleb sensitiive to multple abx  5/13 - stop date today for abx. No fever wbc up some to 16.  5/14 - wbc down a little, no fevers, still with sputum - cx pending  Recommendations Will cont ceftriaxone pending culture of sputum sent 5/13. Continue to work on clearance of secretions.  Thank you very much for the consult. Will follow with you.  Leonel Ramsay   02/11/2018, 1:44 PM

## 2018-02-12 ENCOUNTER — Inpatient Hospital Stay: Payer: Medicaid Other

## 2018-02-12 LAB — BPAM RBC
Blood Product Expiration Date: 201906042359
Blood Product Expiration Date: 201906072359
ISSUE DATE / TIME: 201905111620
ISSUE DATE / TIME: 201905141715
Unit Type and Rh: 5100
Unit Type and Rh: 5100

## 2018-02-12 LAB — CBC
HEMATOCRIT: 22.3 % — AB (ref 40.0–52.0)
Hemoglobin: 6.9 g/dL — ABNORMAL LOW (ref 13.0–18.0)
MCH: 26.9 pg (ref 26.0–34.0)
MCHC: 31 g/dL — AB (ref 32.0–36.0)
MCV: 86.7 fL (ref 80.0–100.0)
PLATELETS: 310 10*3/uL (ref 150–440)
RBC: 2.57 MIL/uL — ABNORMAL LOW (ref 4.40–5.90)
RDW: 18.3 % — AB (ref 11.5–14.5)
WBC: 15.7 10*3/uL — AB (ref 3.8–10.6)

## 2018-02-12 LAB — COMPREHENSIVE METABOLIC PANEL
ALT: 23 U/L (ref 17–63)
ANION GAP: 7 (ref 5–15)
AST: 25 U/L (ref 15–41)
Albumin: 1.6 g/dL — ABNORMAL LOW (ref 3.5–5.0)
Alkaline Phosphatase: 95 U/L (ref 38–126)
BILIRUBIN TOTAL: 0.3 mg/dL (ref 0.3–1.2)
BUN: 38 mg/dL — AB (ref 6–20)
CHLORIDE: 97 mmol/L — AB (ref 101–111)
CO2: 37 mmol/L — ABNORMAL HIGH (ref 22–32)
Calcium: 7.8 mg/dL — ABNORMAL LOW (ref 8.9–10.3)
Creatinine, Ser: 0.73 mg/dL (ref 0.61–1.24)
Glucose, Bld: 143 mg/dL — ABNORMAL HIGH (ref 65–99)
POTASSIUM: 4.5 mmol/L (ref 3.5–5.1)
Sodium: 141 mmol/L (ref 135–145)
TOTAL PROTEIN: 6.1 g/dL — AB (ref 6.5–8.1)

## 2018-02-12 LAB — PREPARE RBC (CROSSMATCH)

## 2018-02-12 LAB — CULTURE, RESPIRATORY: CULTURE: NORMAL

## 2018-02-12 LAB — TYPE AND SCREEN
ABO/RH(D): O POS
Antibody Screen: NEGATIVE
UNIT DIVISION: 0
Unit division: 0

## 2018-02-12 LAB — GLUCOSE, CAPILLARY
GLUCOSE-CAPILLARY: 143 mg/dL — AB (ref 65–99)
GLUCOSE-CAPILLARY: 143 mg/dL — AB (ref 65–99)
GLUCOSE-CAPILLARY: 162 mg/dL — AB (ref 65–99)
GLUCOSE-CAPILLARY: 163 mg/dL — AB (ref 65–99)
Glucose-Capillary: 168 mg/dL — ABNORMAL HIGH (ref 65–99)
Glucose-Capillary: 199 mg/dL — ABNORMAL HIGH (ref 65–99)
Glucose-Capillary: 177 mg/dL — ABNORMAL HIGH (ref 65–99)
Glucose-Capillary: 140 mg/dL — ABNORMAL HIGH (ref 65–99)

## 2018-02-12 LAB — CULTURE, RESPIRATORY W GRAM STAIN

## 2018-02-12 LAB — BRAIN NATRIURETIC PEPTIDE: B NATRIURETIC PEPTIDE 5: 255 pg/mL — AB (ref 0.0–100.0)

## 2018-02-12 MED ORDER — LISINOPRIL 10 MG PO TABS
10.0000 mg | ORAL_TABLET | Freq: Every day | ORAL | Status: DC
  Administered 2018-02-12 – 2018-03-19 (×31): 10 mg
  Filled 2018-02-12: qty 2
  Filled 2018-02-12: qty 1
  Filled 2018-02-12 (×3): qty 2
  Filled 2018-02-12: qty 1
  Filled 2018-02-12 (×6): qty 2
  Filled 2018-02-12 (×2): qty 1
  Filled 2018-02-12: qty 2
  Filled 2018-02-12: qty 1
  Filled 2018-02-12 (×4): qty 2
  Filled 2018-02-12 (×3): qty 1
  Filled 2018-02-12: qty 2
  Filled 2018-02-12: qty 1
  Filled 2018-02-12 (×4): qty 2
  Filled 2018-02-12 (×2): qty 1

## 2018-02-12 MED ORDER — ALTEPLASE 2 MG IJ SOLR
2.0000 mg | Freq: Once | INTRAMUSCULAR | Status: AC
  Administered 2018-02-12: 2 mg

## 2018-02-12 MED ORDER — HYDRALAZINE HCL 20 MG/ML IJ SOLN
10.0000 mg | INTRAMUSCULAR | Status: DC | PRN
  Administered 2018-02-12 – 2018-02-17 (×5): 20 mg via INTRAVENOUS
  Filled 2018-02-12 (×6): qty 1

## 2018-02-12 MED ORDER — STERILE WATER FOR INJECTION IJ SOLN
INTRAMUSCULAR | Status: AC
  Administered 2018-02-12: 10 mL
  Filled 2018-02-12: qty 10

## 2018-02-12 MED ORDER — POTASSIUM CHLORIDE 10 MEQ/50ML IV SOLN
10.0000 meq | INTRAVENOUS | Status: AC
  Administered 2018-02-12 (×4): 10 meq via INTRAVENOUS
  Filled 2018-02-12 (×4): qty 50

## 2018-02-12 MED ORDER — FUROSEMIDE 10 MG/ML IJ SOLN
40.0000 mg | Freq: Once | INTRAMUSCULAR | Status: AC
  Administered 2018-02-12: 40 mg via INTRAVENOUS
  Filled 2018-02-12: qty 4

## 2018-02-12 MED ORDER — SODIUM CHLORIDE 0.9 % IV SOLN
Freq: Once | INTRAVENOUS | Status: AC
  Administered 2018-02-12: 10:00:00 via INTRAVENOUS

## 2018-02-12 MED ORDER — SODIUM CHLORIDE 0.9 % IV SOLN
Freq: Once | INTRAVENOUS | Status: AC
  Administered 2018-02-12: 07:00:00 via INTRAVENOUS

## 2018-02-12 NOTE — Clinical Social Work Note (Signed)
Patient continues to not be medically ready for discharge and is currently on a nimbex drip. CSW will follow up with [facility] once patient is medically stable. [place] Spaniel MSW,LCSW [phone]

## 2018-02-12 NOTE — Progress Notes (Signed)
Patient ID: Tommy Mathews, male   DOB: 1961/06/28, 57 y.o.   MRN: [id number]  [facility] PROGRESS NOTE  Tommy Mathews FYB:[id number] DOB: 12-05-1960 DOA: 01/06/2018 PCP: Tommy Cowman, MD  HPI/Subjective: Patient on sedation and paralysis when I saw him.  On 60% FiO2 on the ventilator.  Objective: Vitals:   02/12/18 1330 02/12/18 1345  BP: 137/70 (!) 173/82  Pulse: 84 93  Resp: (!) 35 (!) 35  Temp:    SpO2: 97% 98%    Filed Weights   01/20/18 1200 01/26/18 0418 02/10/18 0500  Weight: (!) 139.5 kg (307 lb 8.7 oz) 133.2 kg (293 lb 10.4 oz) 116.4 kg (256 lb 9.9 oz)    ROS: Review of Systems  Unable to perform ROS: Acuity of condition   Exam: Physical Exam  HENT:  Nose: No mucosal edema.  Mouth/Throat: No oropharyngeal exudate or posterior oropharyngeal edema.  Eyes: Conjunctivae and lids are normal.  Pupils dilated  Neck: No JVD present. Carotid bruit is not present. No edema present. No thyroid mass and no thyromegaly present.  Cardiovascular: S1 normal and S2 normal. Exam reveals no gallop.  No murmur heard. Respiratory: No respiratory distress. He has decreased breath sounds in the right lower field and the left lower field. He has no wheezes. He has rhonchi in the right lower field and the left lower field. He has no rales.  GI: Soft. Bowel sounds are normal. He exhibits distension. There is no tenderness.  Musculoskeletal:       Right ankle: He exhibits swelling.       Left ankle: He exhibits swelling.  Lymphadenopathy:    He has no cervical adenopathy.  Neurological: He is unresponsive.  Skin: Skin is warm. Nails show no clubbing.  As per nursing staff stage II decubitus on buttock  Psychiatric:  Unresponsive      Data Reviewed: Basic Metabolic Panel: Recent Labs  Lab 02/08/18 0409 02/09/18 0558 02/10/18 0509 02/11/18 0311 02/12/18 0453  NA 138 136 138 140 141  K 4.3 4.4 4.2 3.6 4.5  CL 94* 91* 89* 91* 97*  CO2 39* 42* 43* 42* 37*   GLUCOSE 130* 193* 191* 150* 143*  BUN 22* 31* 35* 34* 38*  CREATININE 0.67 0.78 0.75 0.76 0.73  CALCIUM 8.2* 8.0* 8.4* 8.6* 7.8*  MG 1.7 1.8 2.0 1.8  --   PHOS 4.0 4.1 3.1 2.2*  --    Liver Function Tests: Recent Labs  Lab 02/07/18 0455 02/11/18 0311 02/12/18 0453  AST  --  41 25  ALT  --  27 23  ALKPHOS  --  90 95  BILITOT  --  0.4 0.3  PROT  --  6.4* 6.1*  ALBUMIN 1.6* 1.7* 1.6*   CBC: Recent Labs  Lab 02/07/18 0455  02/08/18 1747 02/09/18 0558 02/10/18 0509 02/11/18 0311 02/12/18 0453  WBC 14.8*   < > 12.3* 11.9* 16.6* 13.9* 15.7*  NEUTROABS 11.8*  --   --   --   --  10.3*  --   HGB 7.2*   < > 7.2* 7.0* 7.4* 6.4* 6.9*  HCT 22.5*   < > 22.2* 20.5* 22.6* 20.3* 22.3*  MCV 85.0   < > 85.1 84.8 84.1 85.1 86.7  PLT 480*   < > 389 361 430 359 310   < > = values in this interval not displayed.   BNP (last 3 results) Recent Labs    01/06/18 1944 01/16/18 2332 02/12/18 0453  BNP 37.0 402.0* 255.0*  CBG: Recent Labs  Lab 02/11/18 2340 02/12/18 0027 02/12/18 0445 02/12/18 0747 02/12/18 1139  GLUCAP 168* 162* 143* 143* 163*    Recent Results (from the past 240 hour(s))  CULTURE, BLOOD (ROUTINE X 2) w Reflex to ID Panel     Status: None   Collection Time: 02/04/18  5:41 PM  Result Value Ref Range Status   Specimen Description BLOOD BLOOD LEFT HAND  Final   Special Requests   Final    BOTTLES DRAWN AEROBIC AND ANAEROBIC Blood Culture adequate volume   Culture   Final    NO GROWTH 5 DAYS Performed at [facility], [address] [place], [place] [address]    Report Status 02/09/2018 FINAL  Final  CULTURE, BLOOD (ROUTINE X 2) w Reflex to ID Panel     Status: None   Collection Time: 02/04/18  8:50 PM  Result Value Ref Range Status   Specimen Description BLOOD LEFT HAND  Final   Special Requests   Final    BOTTLES DRAWN AEROBIC AND ANAEROBIC Blood Culture adequate volume   Culture   Final    NO GROWTH 5 DAYS Performed at [facility], [address] [place], [place] [address]    Report Status 02/10/2018 FINAL  Final  MRSA PCR Screening     Status: None   Collection Time: 02/05/18  9:38 AM  Result Value Ref Range Status   MRSA by PCR NEGATIVE NEGATIVE Final    Comment:        The GeneXpert MRSA Assay (FDA approved for NASAL specimens only), is one component of a comprehensive MRSA colonization surveillance program. It is not intended to diagnose MRSA infection nor to guide or monitor treatment for MRSA infections. Performed at [facility], [address] [place], [place] [address]   Culture, respiratory (NON-Expectorated)     Status: None   Collection Time: 02/10/18 12:02 PM  Result Value Ref Range Status   Specimen Description   Final    TRACHEAL ASPIRATE Performed at [facility], [address], [place], [place] [address]    Special Requests   Final    NONE Performed at [facility], [address] [place], [place] [address]    Gram Stain   Final    MODERATE WBC PRESENT,BOTH PMN AND MONONUCLEAR NO ORGANISMS SEEN    Culture   Final    Consistent with normal respiratory flora. Performed at [facility], [place] [address] [place], [place] [address]    Report Status 02/12/2018 FINAL  Final     Studies: Dg Chest Port 1 View  Result Date: 02/12/2018 CLINICAL DATA:  Respiratory failure. EXAM: PORTABLE CHEST 1 VIEW COMPARISON:  Radiograph of Feb 10, 2018. FINDINGS: Stable cardiomediastinal silhouette. Tracheostomy tube is in grossly good position. Stable right-sided PICC line. Stable diffuse bilateral lung opacities are noted concerning for pneumonia or edema. No pneumothorax is noted. Bony thorax is unremarkable. IMPRESSION: Stable bilateral lung opacities as described above. Electronically Signed   By: Tommy Mathews, M.D.   On: 02/12/2018 07:07    Scheduled Meds: . artificial tears  1 application Both Eyes B4W  . budesonide (PULMICORT) nebulizer solution   0.25 mg Nebulization Q6H  . chlorhexidine gluconate (MEDLINE KIT)  15 mL Mouth Rinse BID  . clonazePAM  2 mg Per Tube BID  . digoxin  0.25 mg Per Tube Daily  . enoxaparin (LOVENOX) injection  40 mg Subcutaneous Daily  . fentaNYL  100 mcg Transdermal Q72H  . gabapentin  200 mg Per Tube Q8H  . insulin aspart  0-20 Units Subcutaneous Q4H  . insulin glargine  40 Units Subcutaneous QHS  . ipratropium-albuterol  3 mL Nebulization Q6H  . mouth rinse  15 mL Mouth Rinse 10 times per day  . methylPREDNISolone (SOLU-MEDROL) injection  80 mg Intravenous Q12H  . metoprolol tartrate  12.5 mg Per Tube Q6H  . nutrition supplement (JUVEN)  1 packet Per Tube BID  . nystatin  5 mL Oral QID  . pantoprazole sodium  40 mg Per Tube Daily  . QUEtiapine  100 mg Oral BID  . valproic acid  1,000 mg Per Tube BID   Continuous Infusions: . cefTRIAXone (ROCEPHIN)  IV    . cisatracurium (NIMBEX) infusion 4 mcg/kg/min (02/12/18 1200)  . feeding supplement (GLUCERNA 1.5 CAL) Stopped (02/12/18 0700)  . fentaNYL infusion INTRAVENOUS 100 mcg/hr (02/12/18 0849)  . midazolam (VERSED) infusion 2 mg/hr (02/12/18 1337)  . phenylephrine (NEO-SYNEPHRINE) Adult infusion Stopped (02/11/18 1015)  . potassium chloride 10 mEq (02/12/18 1337)    Assessment/Plan:  1. Acute hypoxic respiratory failure concerning for ARDS.  Patient still on 60% FiO2.  Patient made a DO NOT RESUSCITATE by critical care specialist.  Started on Solu-Medrol.  Currently on sedation and paralysis for better ventilation. 2. Ventilator associated pneumonia with sepsis with Klebsiella.  Started on Rocephin 3. Atrial fibrillation with rapid ventricular response and episodes of SVT.  Heart rate improved on digoxin and metoprolol.  Amiodarone discontinued. 4. Acute on chronic systolic congestive heart failure with anasarca. 5. Acute encephalopathy with history of traumatic brain injury. 6. Recent GI bleed on PPI.  Anemia.  Receiving 1 unit of blood today on  hemoglobin of 6.9. 7. Acute kidney injury.  Has resolved 8. Type 2 diabetes mellitus on glargine insulin and sliding scale 9. Nutrition.  On tube feeds as per intensivist 10. Depression on Depakene and Seroquel 11. Hyperlipidemia unspecified on atorvastatin 12. Stage II decubitus on buttock  Code Status:     Code Status Orders  (From admission, onward)        Start     Ordered   01/06/18 2227  Full code  Continuous     01/06/18 2226    Code Status History    This patient has a current code status but no historical code status.     Family Communication:  mother and sister at the bedside Disposition Plan: To be determined  Consultants:  Critical care specialist  Infectious disease  Time spent: 26 minutes, case discussed with critical care specialist.  [facility]

## 2018-02-12 NOTE — Progress Notes (Signed)
Pharmacy Electrolyte Monitoring Consult:  Pharmacy consulted to assist in monitoring and replacing electrolytes in this 57 y.o. male admitted on 01/06/2018   Patient requiring mechanical ventilation and has PEG tube.   Patient ordered furosemide  IV daily.  Labs:  Sodium (mmol/L)  Date Value  02/12/2018 141   Potassium (mmol/L)  Date Value  02/12/2018 4.5   Magnesium (mg/dL)  Date Value  16/07/9603 1.8   Phosphorus (mg/dL)  Date Value  54/06/8118 2.2 (L)   Calcium (mg/dL)  Date Value  14/78/2956 7.8 (L)   Albumin (g/dL)  Date Value  21/30/8657 1.6 (L)    Plan: Electrolyte WNL this AM.   Pharmacy will continue to monitor and adjust per consult.   Gardner Candle, PharmD, BCPS Clinical Pharmacist 02/12/2018 12:17 PM

## 2018-02-12 NOTE — Progress Notes (Signed)
[facility] INFECTIOUS DISE[facility] PROGRESS NOTE Date of Admission:  01/06/2018     ID: Tommy Mathews is a 57 y.o. male with fever Principal Problem:   Severe sepsis with septic shock ([place]) Active Problems:   GI bleed   Acute respiratory failure with hypoxia (HCC)   Multifocal pneumonia   UTI (urinary tract infection)   AKI (acute kidney injury) ([place])   Ileus (HCC)   Diabetes ([place])   Pressure injury of skin   Subjective: No fevers, wbc up some but started steroids.  ROS  Unable to obtain Medications:  Antibiotics Given (last 72 hours)    Date/Time Action Medication Dose Rate   02/09/18 1950 New Bag/Given   ceFAZolin (ANCEF) IVPB 2g/100 mL premix 2 g 200 mL/hr   02/10/18 0455 New Bag/Given   ceFAZolin (ANCEF) IVPB 2g/100 mL premix 2 g 200 mL/hr   02/11/18 1418 New Bag/Given   cefTRIAXone (ROCEPHIN) 1 g in sodium chloride 0.9 % 100 mL IVPB 1 g 200 mL/hr     . artificial tears  1 application Both Eyes Z6X  . budesonide (PULMICORT) nebulizer solution  0.25 mg Nebulization Q6H  . chlorhexidine gluconate (MEDLINE KIT)  15 mL Mouth Rinse BID  . clonazePAM  2 mg Per Tube BID  . digoxin  0.25 mg Per Tube Daily  . enoxaparin (LOVENOX) injection  40 mg Subcutaneous Daily  . fentaNYL  100 mcg Transdermal Q72H  . gabapentin  200 mg Per Tube Q8H  . insulin aspart  0-20 Units Subcutaneous Q4H  . insulin glargine  40 Units Subcutaneous QHS  . ipratropium-albuterol  3 mL Nebulization Q6H  . mouth rinse  15 mL Mouth Rinse 10 times per day  . methylPREDNISolone (SOLU-MEDROL) injection  80 mg Intravenous Q12H  . metoprolol tartrate  12.5 mg Per Tube Q6H  . nutrition supplement (JUVEN)  1 packet Per Tube BID  . nystatin  5 mL Oral QID  . pantoprazole sodium  40 mg Per Tube Daily  . QUEtiapine  100 mg Oral BID  . valproic acid  1,000 mg Per Tube BID    Objective: Vital signs in last 24 hours: Temp:  [97.6 F (36.4 C)-98.6 F (37 C)] 98.3 F (36.8 C) (05/15 1239) Pulse Rate:   [61-103] 101 (05/15 1445) Resp:  [21-35] 35 (05/15 1445) BP: (100-190)/(56-97) 182/83 (05/15 1445) SpO2:  [71 %-100 %] 97 % (05/15 1445) FiO2 (%):  [60 %-100 %] 60 % (05/15 1406) Constitutional: lethargic, HENT: trach in place, anicteric Mouth/Throat: Oropharynx is clear and moist.  Cardiovascular: Normal rate, regular rhythm and normal heart sounds.  Pulmonary/Chest: rhonchi bil Abdominal: Soft. Bowel sounds are normal. He exhibits no distension. There is no tenderness.  PEG Site wnl Lymphadenopathy: He has no cervical adenopathy.  Neurological: lethargic Skin: Skin is warm and dry. No rash noted. No erythema.  Psychiatric: trached PICC RUE WNL    Lab Results Recent Labs    02/11/18 0311 02/12/18 0453  WBC 13.9* 15.7*  HGB 6.4* 6.9*  HCT 20.3* 22.3*  NA 140 141  K 3.6 4.5  CL 91* 97*  CO2 42* 37*  BUN 34* 38*  CREATININE 0.76 0.73    Microbiology: Results for orders placed or performed during the hospital encounter of 01/06/18  Blood Culture (routine x 2)     Status: None   Collection Time: 01/06/18  7:44 PM  Result Value Ref Range Status   Specimen Description BLOOD BLOOD LEFT WRIST  Final   Special Requests  Final    BOTTLES DRAWN AEROBIC AND ANAEROBIC Blood Culture adequate volume   Culture   Final    NO GROWTH 5 DAYS Performed at [facility], [address] [place], [place] [address]    Report Status 01/11/2018 FINAL  Final  Urine culture     Status: Abnormal   Collection Time: 01/06/18  7:44 PM  Result Value Ref Range Status   Specimen Description   Final    URINE, RANDOM Performed at [facility], [address] [place], [place] [address]    Special Requests   Final    NONE Performed at [facility], [address] [place], [place] [address]    Culture (A)  Final    10,000 COLONIES/mL STAPHYLOCOCCUS SPECIES (COAGULASE NEGATIVE) CALL MICROBIOLOGY LAB IF SENSITIVITIES ARE REQUIRED. Performed at [place], [place] [address] [place], [place] [address]    Report Status 01/08/2018 FINAL  Final  MRSA PCR Screening     Status: Abnormal   Collection Time: 01/06/18 11:51 PM  Result Value Ref Range Status   MRSA by PCR POSITIVE (A) NEGATIVE Final    Comment:        The GeneXpert MRSA Assay (FDA approved for NASAL specimens only), is one component of a comprehensive MRSA colonization surveillance program. It is not intended to diagnose MRSA infection nor to guide or monitor treatment for MRSA infections. RESULT CALLED TO, READ BACK BY AND VERIFIED WITH: BETH BUONO 01/07/18 @ 0121  [place]   Performed at [facility], [address] [place], [place] [address]   Blood Culture (routine x 2)     Status: None   Collection Time: 01/07/18  7:19 AM  Result Value Ref Range Status   Specimen Description BLOOD RIGHT HAND  Final   Special Requests   Final    BOTTLES DRAWN AEROBIC AND ANAEROBIC Blood Culture results may not be optimal due to an inadequate volume of blood received in culture bottles   Culture   Final    NO GROWTH 5 DAYS Performed at [facility], [address], [place], [place] [address]    Report Status 01/12/2018 FINAL  Final  Culture, respiratory (NON-Expectorated)     Status: None   Collection Time: 01/07/18 12:28 PM  Result Value Ref Range Status   Specimen Description   Final    TRACHEAL ASPIRATE Performed at [facility], [address], [place], [place] [address]    Special Requests   Final    Normal Performed at [facility], [address] [place], [place] [address]    Gram Stain   Final    MODERATE WBC PRESENT,BOTH PMN AND MONONUCLEAR RARE GRAM POSITIVE COCCI RARE YEAST    Culture   Final    Consistent with normal respiratory flora. Performed at [facility], [place] [address], [place], [place] [address]    Report Status 01/09/2018 FINAL  Final  Culture, expectorated sputum-assessment     Status: None   Collection Time:  01/18/18  5:07 PM  Result Value Ref Range Status   Specimen Description ENDOTRACHEAL  Final   Special Requests NONE  Final   Sputum evaluation   Final    THIS SPECIMEN IS ACCEPTABLE FOR SPUTUM CULTURE Performed at [facility], [address] [place], [place] [address]    Report Status 01/18/2018 FINAL  Final  Culture, respiratory (NON-Expectorated)     Status: None   Collection Time: 01/18/18  5:07 PM  Result Value Ref Range Status   Specimen Description   Final    ENDOTRACHEAL Performed at [facility], [address] [place], [place] [address]    Special Requests   Final    NONE Reflexed from [id number] Performed at [facility], [address] [place], [place] [address]    Gram Stain   Final    MODERATE WBC PRESENT,BOTH PMN AND MONONUCLEAR NO SQUAMOUS EPITHELIAL CELLS SEEN RARE BUDDING YEAST SEEN    Culture   Final    RARE Consistent with normal respiratory flora. Performed at [facility], [place] [address], [place], [place] [address]    Report Status 01/21/2018 FINAL  Final  MRSA PCR Screening     Status: Abnormal   Collection Time: 01/18/18  8:26 PM  Result Value Ref Range Status   MRSA by PCR POSITIVE (A) NEGATIVE Final    Comment:        The GeneXpert MRSA Assay (FDA approved for NASAL specimens only), is one component of a comprehensive MRSA colonization surveillance program. It is not intended to diagnose MRSA infection nor to guide or monitor treatment for MRSA infections. RESULT CALLED TO, READ BACK BY AND VERIFIED WITH: ANGELA BREHUN AT 2200 ON 01/18/18 RWW Performed at [facility], [address] [place], [place] [address]   CULTURE, BLOOD (ROUTINE X 2) w Reflex to ID Panel     Status: Abnormal   Collection Time: 01/31/18  8:17 AM  Result Value Ref Range Status   Specimen Description   Final    BLOOD LEFT HAND Performed at [facility], [place] [address] [place], [place] [address]    Special Requests    Final    BOTTLES DRAWN AEROBIC AND ANAEROBIC Blood Culture results may not be optimal due to an excessive volume of blood received in culture bottles Performed at [facility], [address] [place], [place] [address]    Culture  Setup Time   Final    GRAM NEGATIVE RODS AEROBIC BOTTLE ONLY CRITICAL RESULT CALLED TO, READ BACK BY AND VERIFIED WITH: Jashawna Reever BESANTI ON 02/05/18 AT 0228 JAG Performed at [facility], [place] [address], [place], [place] [address]    Culture KLEBSIELLA PNEUMONIAE (A)  Final   Report Status 02/07/2018 FINAL  Final   Organism ID, Bacteria KLEBSIELLA PNEUMONIAE  Final      Susceptibility   Klebsiella pneumoniae - MIC*    AMPICILLIN >=32 RESISTANT Resistant     CEFAZOLIN <=4 SENSITIVE Sensitive     CEFEPIME <=1 SENSITIVE Sensitive     CEFTAZIDIME <=1 SENSITIVE Sensitive     CEFTRIAXONE <=1 SENSITIVE Sensitive     CIPROFLOXACIN <=0.25 SENSITIVE Sensitive     GENTAMICIN <=1 SENSITIVE Sensitive     IMIPENEM <=0.25 SENSITIVE Sensitive     TRIMETH/SULFA <=20 SENSITIVE Sensitive     AMPICILLIN/SULBACTAM >=32 RESISTANT Resistant     PIP/TAZO 16 SENSITIVE Sensitive     Extended ESBL NEGATIVE Sensitive     * KLEBSIELLA PNEUMONIAE  Blood Culture ID Panel (Reflexed)     Status: Abnormal   Collection Time: 01/31/18  8:17 AM  Result Value Ref Range Status   Enterococcus species NOT DETECTED NOT DETECTED Final   Listeria monocytogenes NOT DETECTED NOT DETECTED Final   Staphylococcus species NOT DETECTED NOT DETECTED Final   Staphylococcus aureus NOT DETECTED NOT DETECTED Final   Streptococcus species NOT DETECTED NOT DETECTED Final   Streptococcus agalactiae  NOT DETECTED NOT DETECTED Final   Streptococcus pneumoniae NOT DETECTED NOT DETECTED Final   Streptococcus pyogenes NOT DETECTED NOT DETECTED Final   Acinetobacter baumannii NOT DETECTED NOT DETECTED Final   Enterobacteriaceae species DETECTED (A) NOT DETECTED Final    Comment: Enterobacteriaceae  represent a large family of gram-negative bacteria, not a single organism. CRITICAL RESULT CALLED TO, READ BACK BY AND VERIFIED WITH: Dyson Sevey BESANTI ON 02/05/18 AT 0228 JAG    Enterobacter cloacae complex NOT DETECTED NOT DETECTED Final   Escherichia coli NOT DETECTED NOT DETECTED Final   Klebsiella oxytoca NOT DETECTED NOT DETECTED Final   Klebsiella pneumoniae DETECTED (A) NOT DETECTED Final    Comment: CRITICAL RESULT CALLED TO, READ BACK BY AND VERIFIED WITH: Reynalda Canny BESANTI ON 02/05/18 AT 0228 JAG    Proteus species NOT DETECTED NOT DETECTED Final   Serratia marcescens NOT DETECTED NOT DETECTED Final   Carbapenem resistance NOT DETECTED NOT DETECTED Final   Haemophilus influenzae NOT DETECTED NOT DETECTED Final   Neisseria meningitidis NOT DETECTED NOT DETECTED Final   Pseudomonas aeruginosa NOT DETECTED NOT DETECTED Final   Candida albicans NOT DETECTED NOT DETECTED Final   Candida glabrata NOT DETECTED NOT DETECTED Final   Candida krusei NOT DETECTED NOT DETECTED Final   Candida parapsilosis NOT DETECTED NOT DETECTED Final   Candida tropicalis NOT DETECTED NOT DETECTED Final    Comment: Performed at [facility], [address], [place], [place] [address]  Urine Culture     Status: None   Collection Time: 01/31/18  8:30 AM  Result Value Ref Range Status   Specimen Description   Final    URINE, RANDOM Performed at [facility], [address] [place], [place] [address]    Special Requests   Final    NONE Performed at [facility], [address] [place], [place] [address]    Culture   Final    NO GROWTH Performed at [facility], [place] [address], [place], [place] [address]    Report Status 02/01/2018 FINAL  Final  CULTURE, BLOOD (ROUTINE X 2) w Reflex to ID Panel     Status: Abnormal   Collection Time: 01/31/18  8:53 AM  Result Value Ref Range Status   Specimen Description   Final    BLOOD BLOOD RIGHT HAND Performed at [facility], [address] [place], [place] [address]    Special Requests   Final    BOTTLES DRAWN AEROBIC AND ANAEROBIC Blood Culture adequate volume Performed at [facility], [address] [place], [place] [address]    Culture  Setup Time   Final    GRAM NEGATIVE RODS AEROBIC BOTTLE ONLY CRITICAL RESULT CALLED TO, READ BACK BY AND VERIFIED WITH: Vera Wishart BESANTI AT 4356 ON 02/05/18 [place]. Performed at [facility], [address] [place], [place] [address]    Culture (A)  Final    KLEBSIELLA PNEUMONIAE SUSCEPTIBILITIES PERFORMED ON PREVIOUS CULTURE WITHIN THE LAST 5 DAYS. Performed at [facility], [place] [address], [place], [place] [address]    Report Status 02/07/2018 FINAL  Final  Culture, respiratory (NON-Expectorated)     Status: None   Collection Time: 01/31/18 10:47 AM  Result Value Ref Range Status   Specimen Description   Final    TRACHEAL ASPIRATE Performed at [facility], [address] [place], [place] [address]    Special Requests   Final    NONE Performed at [facility], [place], [place] [address]    Gram Stain   Final    MODERATE WBC PRESENT, PREDOMINANTLY PMN MODERATE GRAM POSITIVE COCCI MODERATE GRAM POSITIVE RODS    Culture   Final    Consistent with normal respiratory flora. Performed at [facility], [place] [address], [place], [place] [address]    Report Status 02/02/2018 FINAL  Final  CULTURE, BLOOD (ROUTINE X 2) w Reflex to ID Panel     Status: None   Collection Time: 02/04/18  5:41 PM  Result Value Ref Range Status   Specimen Description BLOOD BLOOD LEFT HAND  Final   Special Requests   Final    BOTTLES DRAWN AEROBIC AND ANAEROBIC Blood Culture adequate volume   Culture   Final    NO GROWTH 5 DAYS Performed at [facility], [address] [place], [place] [address]    Report Status 02/09/2018 FINAL  Final  CULTURE, BLOOD (ROUTINE X 2) w Reflex to ID Panel      Status: None   Collection Time: 02/04/18  8:50 PM  Result Value Ref Range Status   Specimen Description BLOOD LEFT HAND  Final   Special Requests   Final    BOTTLES DRAWN AEROBIC AND ANAEROBIC Blood Culture adequate volume   Culture   Final    NO GROWTH 5 DAYS Performed at [facility], [address] [place], [place] [address]    Report Status 02/10/2018 FINAL  Final  MRSA PCR Screening     Status: None   Collection Time: 02/05/18  9:38 AM  Result Value Ref Range Status   MRSA by PCR NEGATIVE NEGATIVE Final    Comment:        The GeneXpert MRSA Assay (FDA approved for NASAL specimens only), is one component of a comprehensive MRSA colonization surveillance program. It is not intended to diagnose MRSA infection nor to guide or monitor treatment for MRSA infections. Performed at [facility], [address] [place], [place] [address]   Culture, respiratory (NON-Expectorated)     Status: None   Collection Time: 02/10/18 12:02 PM  Result Value Ref Range Status   Specimen Description   Final    TRACHEAL ASPIRATE Performed at [facility], [address], [place], [place] [address]    Special Requests   Final    NONE Performed at [facility], [address] [place], [place] [address]    Gram Stain   Final    MODERATE WBC PRESENT,BOTH PMN AND MONONUCLEAR NO ORGANISMS SEEN    Culture   Final    Consistent with normal respiratory flora. Performed at [facility], [place] [address] [place], [place] [address]    Report Status 02/12/2018 FINAL  Final    Studies/Results: Dg Chest Port 1 View  Result Date: 02/12/2018 CLINICAL DATA:  Respiratory failure. EXAM: PORTABLE CHEST 1 VIEW COMPARISON:  Radiograph of Feb 10, 2018. FINDINGS: Stable cardiomediastinal silhouette. Tracheostomy tube is in grossly good position. Stable right-sided PICC line. Stable diffuse bilateral lung opacities are noted concerning for pneumonia or edema. No  pneumothorax is noted. Bony thorax is unremarkable. IMPRESSION: Stable bilateral lung opacities as described above. Electronically Signed   By: Marijo Conception, M.D.   On: 02/12/2018 07:07    Assessment/Plan: Uvaldo Rybacki is a 57 y.o. 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 He  has had a prolonged admission and is now trached and has PEG. He was treated with IV abx until 4/15 then again 4/20-21. Was then off of abx until developed fever 5/2 with wbc up some to 12. CXR however did not show any new opacities but has increasing resp requirements and increased sputum.  PICC line RUE changed. Prior sputum cx with Nml flora 4/20. Prior bcx neg. FU BCX and sputum pending.  5/6- defervesced, bcx neg, sputum cx negative. WBC down 17->12. Day 4 of vanco and mero. Imaging shows no change in severe patchy opacities bilaterally.  5/8 had fever and increased wbc. cx now + klebsiellafrom 5/3 5/10 - no fevers, cx with Kleb sensitiive to multple abx  5/13 - stop date today for abx. No fever wbc up some to 16.  5/14 - wbc down a little, no fevers, still with sputum - cx pending 5/15- no fevers, wbc remains a little elevated. Sputum cx with nml flora.   Recommendations Can dc abx and monitor  Continue to work on clearance of secretions.  Thank you very much for the consult. Will follow with you.  Leonel Ramsay   02/12/2018, 2:55 PM

## 2018-02-12 NOTE — Progress Notes (Signed)
PULMONARY / CRITICAL CARE MEDICINE   Name: Tommy Mathews MRN: [id number] DOB: 1961-08-03    ADMISSION DATE:  01/06/2018   PT PROFILE:  71 M SNF resident due to psychiatric illness and history of TBI admitted with acute hypoxemic respiratory failure, severe bilateral pulmonary infiltrates, severe sepsis/septic shock.  Intubated in the ED shortly after arrival.  Was also noted to have dark coffee-ground material suctioned from stomach.  Patient chronically on rivaroxaban.   MAJOR EVENTS/TEST RESULTS: 04/08 Admission as above 04/08 CT chest: Multifocal severe pneumonia with RUL collapse and soft tissue obstructing RUL bronchus.  04/08 CTAP: Mild small bowel ileus, potential enteritis. Nasogastric tube terminates in distal stomach. Cholelithiasis without CT findings of acute cholecystitis.Consider bronchoscopy.  04/09 nephrology consultation: No acute indication for dialysis at present. 04/09 gastroenterology consultation: Recommend high-dose PPI therapy. 04/10 severely hypoxemic despite 100% FiO2.  Recruitment maneuver with significant improvement.  PEEP increased and ARDS strategy on ventilator implemented.  Neuromuscular blockade implemented due to ventilator dyssynchrony. 04/11 PAF with RVR > converted with amiodarone. Gas exchange improving. Off Nimbex 04/12 Echocardiogram: EF 20-25%.  Diffuse HK. 04/13 High airway pressure with vent dyssynchony; given Vecuronium; FIO2 increased to 50%  04/16 Cardiology consultation 04/13 Amiodarone D/C because of SB 04/15 increased HR, EF 20% afib with RVR 04/17 failed weaning trials, HR remains elevated 04/19 ENT consultation for trach tube placement 04/20 no significant changes in the last 24 hours. Has not tolerated any weaning  04/23 PEG placed. 04/23 intermittent ventilator dyssynchrony - sedation regimen adjusted 04/24 Somewhat more responsive. Not F/C 04/25 Trach tube placed 04/25 significantly more responsive 04/30 and hypoxemia and pulmonary  infiltrates 05/02 fever, leukocytosis 05/03 severe ARDS pattern on CXR 05/03 ID consultation 05/14 goals of care discussion with patient's sister and mother.  Agree to continue aggressive support but DNR in event of cardiac arrest. Received one unit RBCs for Hgb 6.4. For severe ARDS, NMB protocol initiated 05/15 Gas exchange much improved. Remains on NMB. Received one unit RBCs for Hgb 6.9    INDWELLING DEVICES: L femoral CVL 04/08 >> 4/16 ETT 04/08 >> 04/25 RUE PICC 4/15 >>  PEG 04/23 >>  Trach tube 04/25 >>   MICRO DATA: MRSA PCR 04/08, 04/20 >> POS, 05/08 >> NEG Urine 04/08 >> 10k coag negative staph Resp 04/09 >> c/w NOF Blood 04/08 >> NEG Blood 04/09 >> NEG Resp 04/20 >> NOF Urine 05/03 >> NEG  Resp 05/03 >> NOF Blood 05/03 >> 2/2 Klebsiella Blood 05/07 >> NEG Resp 05/13 >> NOF   ANTIMICROBIALS:  Unasyn 04/08 >> 04/10 Vanc 04/08 >> 4/15, 05/03 >> 05/10 Cefepime 04/10 >> 4/15 Metronidazole 04/10 >> 4/15 Meropenem 05/03 >> 05/10 Cefazolin 05/11 >> 05/14 Ceftriaxone 05/14 >>    SUBJECTIVE:  On NMB, continuous sedation. BIS < 40  VITAL SIGNS: BP 132/71   Pulse 95   Temp 98.3 F (36.8 C) (Axillary)   Resp (!) 35   Ht  (1.778 m)   Wt 256 lb 9.9 oz (116.4 kg)   SpO2 97%   BMI 36.82 kg/m    VENTILATOR SETTINGS: Vent Mode: PRVC FiO2 (%):  [60 %-100 %] 60 % Set Rate:  [35 bmp] 35 bmp Vt Set:  [400 mL] 400 mL PEEP:  [12 cmH20-18 cmH20] 12 cmH20  INTAKE / OUTPUT: I/O last 3 completed shifts: In: 3611.6 [I.V.:1069.8; Blood:660; NG/GT:1881.8] Out: 1675 [Urine:1475; Stool:200]  PHYSICAL EXAMINATION: General: RASS N/A on NMB Neuro: PERRL HEENT: NCAT, sclerae white Cardiovascular: Regular, no M Lungs: No  wheezes, few crackles dependently Abdomen: Nontender, + BS GU: minimal scrotal edema Extremities: Symmetric BLE edema  LABS:  BMET Recent Labs  Lab 02/10/18 0509 02/11/18 0311 02/12/18 0453  NA 138 140 141  K 4.2 3.6 4.5  CL 89* 91* 97*   CO2 43* 42* 37*  BUN 35* 34* 38*  CREATININE 0.75 0.76 0.73  GLUCOSE 191* 150* 143*    Electrolytes Recent Labs  Lab 02/09/18 0558 02/10/18 0509 02/11/18 0311 02/12/18 0453  CALCIUM 8.0* 8.4* 8.6* 7.8*  MG 1.8 2.0 1.8  --   PHOS 4.1 3.1 2.2*  --     CBC Recent Labs  Lab 02/10/18 0509 02/11/18 0311 02/12/18 0453  WBC 16.6* 13.9* 15.7*  HGB 7.4* 6.4* 6.9*  HCT 22.6* 20.3* 22.3*  PLT 430 359 310    Coag's No results for input(s): APTT, INR in the last 168 hours.  Sepsis Markers No results for input(s): LATICACIDVEN, PROCALCITON, O2SATVEN in the last 168 hours.  ABG Recent Labs  Lab 02/09/18 1612 02/10/18 0418 02/11/18 0500  PHART 7.35 7.49* 7.51*  PCO2ART 92* 67* 63*  PO2ART 52* 77* 66*    Liver Enzymes Recent Labs  Lab 02/07/18 0455 02/11/18 0311 02/12/18 0453  AST  --  41 25  ALT  --  27 23  ALKPHOS  --  90 95  BILITOT  --  0.4 0.3  ALBUMIN 1.6* 1.7* 1.6*    Cardiac Enzymes No results for input(s): TROPONINI, PROBNP in the last 168 hours.  Glucose Recent Labs  Lab 02/11/18 2030 02/11/18 2340 02/12/18 0027 02/12/18 0445 02/12/18 0747 02/12/18 1139  GLUCAP 153* 168* 162* 143* 143* 163*    CXR: ARDS pattern    ASSESSMENT / PLAN: PULMONARY A: Acute hypoxemic respiratory failure Aspiration pneumonia ARDS, recurrent Pulmonary edema Prolonged mechanical ventilation Tracheostomy tube status P:   Cont full vent support - Lung protection strategy Cont vent bundle Daily SBT if/when meets criteria  Continue nebulized bronchodilators Cont empiric methylprednisolone for possible amiodarone induced lung injury  CARDIOVASCULAR A: septic shock, resolved Paroxysmal atrial fibrillation - NSR presently Dilated cardiomyopathy, etiology unclear Hypertension Intermittent sinus tachycardia P:  MAP goal > 65 mmHg Discontinue amiodarone as it might be contributing to lung injury Resume hydrochlorothiazide at reduced dose Continue  scheduled metoprolol Resume lisinopril Continue hydralazine as needed to maintain SBP < 160 mmHg  RENAL A:   AKI, resolved Hypernatremia, resolved Hypervolemia/anasarca - improving Metabolic alkalosis due to loop diuresis, improving P: Monitor BMET intermittently Monitor I/Os Correct electrolytes as indicated  Continue free water per G-tube Furosemide X 1 dose 05/15  GASTROINTESTINAL A:   Abdominal distention, resolved Suspected UGIB, resolved G tube status Gastric ileus  P:   SUP: Enteral PPI Continue TF protocol - holding presently  HEMATOLOGIC A: ICU acquired anemia P: DVT px: enoxaparin Monitor CBC intermittently  Transfuse per usual guidelines   INFECTIOUS A:   Severe sepsis with shock, resolved Aspiration pneumonia, treated Klebsiella pneumonia with bacteremia P:   Monitor temp, WBC count Micro and abx as above   ENDOCRINE A:   Type 2 diabetes, adequately controlled P:   Continue Lantus - increased 05/14 with initiation of methylpred Continue resistant scale SSI  NEUROLOGIC A:   Ventilator dyssynchrony ICU/ventilator associated discomfort History of schizophrenia History of TBI P:   RASS goal: N/A Cont clonazepam Cont Duragesic patch - resumed 05/14 Cont quetiapine - resumed 05/14 Cont fent, midaz infusions while on NMB  BIS goal 40-60   Family updated @  bedside   CCM time: 40 mins The above time includes time spent in consultation with patient and/or family members and reviewing care plan on multidisciplinary rounds  Billy Fischer, MD PCCM service Mobile [phone] Pager [phone] 02/12/2018 3:20 PM

## 2018-02-12 NOTE — Progress Notes (Signed)
Nimbex drip remains infusing.  BIS results reported to Tommy Harman, NP.  No new orders given.RASS at -5.  Pt is synchronous with the vent  One unit of PRBCs ordered for hemoglobin of 6.9.

## 2018-02-13 ENCOUNTER — Inpatient Hospital Stay: Payer: Medicaid Other

## 2018-02-13 LAB — CBC
HEMATOCRIT: 25.8 % — AB (ref 40.0–52.0)
HEMOGLOBIN: 8.1 g/dL — AB (ref 13.0–18.0)
MCH: 26.9 pg (ref 26.0–34.0)
MCHC: 31.4 g/dL — AB (ref 32.0–36.0)
MCV: 85.6 fL (ref 80.0–100.0)
Platelets: 352 10*3/uL (ref 150–440)
RBC: 3.01 MIL/uL — ABNORMAL LOW (ref 4.40–5.90)
RDW: 17.7 % — AB (ref 11.5–14.5)
WBC: 18.9 10*3/uL — ABNORMAL HIGH (ref 3.8–10.6)

## 2018-02-13 LAB — COMPREHENSIVE METABOLIC PANEL
ALK PHOS: 94 U/L (ref 38–126)
ALT: 22 U/L (ref 17–63)
ANION GAP: 8 (ref 5–15)
AST: 24 U/L (ref 15–41)
Albumin: 1.7 g/dL — ABNORMAL LOW (ref 3.5–5.0)
BUN: 39 mg/dL — ABNORMAL HIGH (ref 6–20)
CALCIUM: 8.5 mg/dL — AB (ref 8.9–10.3)
CO2: 37 mmol/L — ABNORMAL HIGH (ref 22–32)
Chloride: 94 mmol/L — ABNORMAL LOW (ref 101–111)
Creatinine, Ser: 0.76 mg/dL (ref 0.61–1.24)
GFR calc non Af Amer: >60 mL/min (ref >60)
Glucose, Bld: 165 mg/dL — ABNORMAL HIGH (ref 65–99)
Potassium: 4.3 mmol/L (ref 3.5–5.1)
SODIUM: 139 mmol/L (ref 135–145)
TOTAL PROTEIN: 6.3 g/dL — AB (ref 6.5–8.1)
Total Bilirubin: 0.4 mg/dL (ref 0.3–1.2)

## 2018-02-13 LAB — TYPE AND SCREEN
ABO/RH(D): O POS
Antibody Screen: NEGATIVE
UNIT DIVISION: 0

## 2018-02-13 LAB — GLUCOSE, CAPILLARY
GLUCOSE-CAPILLARY: 156 mg/dL — AB (ref 65–99)
GLUCOSE-CAPILLARY: 128 mg/dL — AB (ref 65–99)
Glucose-Capillary: 170 mg/dL — ABNORMAL HIGH (ref 65–99)
Glucose-Capillary: 136 mg/dL — ABNORMAL HIGH (ref 65–99)
Glucose-Capillary: 107 mg/dL — ABNORMAL HIGH (ref 65–99)

## 2018-02-13 LAB — BPAM RBC
BLOOD PRODUCT EXPIRATION DATE: 201906062359
ISSUE DATE / TIME: 201905150937
UNIT TYPE AND RH: 5100

## 2018-02-13 LAB — PHOSPHORUS: PHOSPHORUS: 4.1 mg/dL (ref 2.5–4.6)

## 2018-02-13 MED ORDER — FUROSEMIDE 10 MG/ML IJ SOLN
40.0000 mg | Freq: Once | INTRAMUSCULAR | Status: AC
  Administered 2018-02-13: 40 mg via INTRAVENOUS
  Filled 2018-02-13: qty 4

## 2018-02-13 MED ORDER — METHYLPREDNISOLONE SODIUM SUCC 40 MG IJ SOLR
40.0000 mg | Freq: Two times a day (BID) | INTRAMUSCULAR | Status: DC
  Administered 2018-02-13 – 2018-02-14 (×2): 40 mg via INTRAVENOUS
  Filled 2018-02-13 (×2): qty 1

## 2018-02-13 MED ORDER — VITAL HIGH PROTEIN PO LIQD: 1000.0000 mL | ORAL | Status: DC

## 2018-02-13 MED ORDER — BISACODYL 10 MG RE SUPP
10.0000 mg | Freq: Every day | RECTAL | Status: DC | PRN
  Administered 2018-02-26: 10 mg via RECTAL
  Filled 2018-02-13 (×2): qty 1

## 2018-02-13 MED ORDER — MIDAZOLAM HCL 2 MG/2ML IJ SOLN
2.0000 mg | INTRAMUSCULAR | Status: DC | PRN
  Administered 2018-02-13 – 2018-02-19 (×16): 2 mg via INTRAVENOUS
  Filled 2018-02-13 (×16): qty 2

## 2018-02-13 MED ORDER — POTASSIUM CHLORIDE 20 MEQ/15ML (10%) PO SOLN
40.0000 meq | Freq: Once | ORAL | Status: AC
  Administered 2018-02-13: 40 meq
  Filled 2018-02-13: qty 30

## 2018-02-13 MED ORDER — FENTANYL CITRATE (PF) 100 MCG/2ML IJ SOLN
100.0000 ug | INTRAMUSCULAR | Status: DC | PRN
  Administered 2018-02-13 – 2018-02-19 (×14): 100 ug via INTRAVENOUS
  Filled 2018-02-13 (×14): qty 2

## 2018-02-13 MED ORDER — DOCUSATE SODIUM 50 MG/5ML PO LIQD
100.0000 mg | Freq: Two times a day (BID) | ORAL | Status: DC | PRN
  Filled 2018-02-13 (×2): qty 10

## 2018-02-13 MED ORDER — ACETAZOLAMIDE SODIUM 500 MG IJ SOLR
500.0000 mg | Freq: Once | INTRAMUSCULAR | Status: AC
  Administered 2018-02-13: 500 mg via INTRAVENOUS
  Filled 2018-02-13: qty 500

## 2018-02-13 MED ORDER — PRO-STAT SUGAR FREE PO LIQD
30.0000 mL | Freq: Two times a day (BID) | ORAL | Status: DC
  Administered 2018-02-13: 30 mL

## 2018-02-13 MED ORDER — FREE WATER
200.0000 mL | Freq: Three times a day (TID) | Status: DC
  Administered 2018-02-13 – 2018-02-21 (×23): 200 mL

## 2018-02-13 MED ORDER — GLUCERNA 1.5 CAL PO LIQD
1000.0000 mL | ORAL | Status: DC
  Administered 2018-02-13 – 2018-03-04 (×22): 1000 mL

## 2018-02-13 MED ORDER — DEXMEDETOMIDINE HCL IN NACL 400 MCG/100ML IV SOLN
0.0000 ug/kg/h | INTRAVENOUS | Status: DC
  Administered 2018-02-13: 2 ug/kg/h via INTRAVENOUS
  Administered 2018-02-13: 1.2 ug/kg/h via INTRAVENOUS
  Administered 2018-02-13: 2 ug/kg/h via INTRAVENOUS
  Administered 2018-02-13: 1.8 ug/kg/h via INTRAVENOUS
  Administered 2018-02-14 (×6): 2 ug/kg/h via INTRAVENOUS
  Administered 2018-02-14: 1.8 ug/kg/h via INTRAVENOUS
  Administered 2018-02-14 – 2018-02-16 (×10): 2 ug/kg/h via INTRAVENOUS
  Administered 2018-02-16: 1.4 ug/kg/h via INTRAVENOUS
  Administered 2018-02-16 (×2): 2 ug/kg/h via INTRAVENOUS
  Administered 2018-02-16: 1.8 ug/kg/h via INTRAVENOUS
  Administered 2018-02-17: 0.7 ug/kg/h via INTRAVENOUS
  Administered 2018-02-17: 0.8 ug/kg/h via INTRAVENOUS
  Administered 2018-02-17: 0.794 ug/kg/h via INTRAVENOUS
  Administered 2018-02-17: 0.8 ug/kg/h via INTRAVENOUS
  Administered 2018-02-17 – 2018-02-18 (×2): 0.7 ug/kg/h via INTRAVENOUS
  Administered 2018-02-18 (×2): 1 ug/kg/h via INTRAVENOUS
  Administered 2018-02-19: 0.4 ug/kg/h via INTRAVENOUS
  Filled 2018-02-13 (×11): qty 100
  Filled 2018-02-13: qty 200
  Filled 2018-02-13 (×33): qty 100

## 2018-02-13 NOTE — Progress Notes (Signed)
PULMONARY / CRITICAL CARE MEDICINE   Name: Tommy Mathews MRN: [id number] DOB: 1961/05/09    ADMISSION DATE:  01/06/2018   PT PROFILE:  45 M SNF resident due to psychiatric illness and history of TBI admitted with acute hypoxemic respiratory failure, severe bilateral pulmonary infiltrates, severe sepsis/septic shock.  Intubated in the ED shortly after arrival.  Was also noted to have dark coffee-ground material suctioned from stomach.  Patient chronically on rivaroxaban.   MAJOR EVENTS/TEST RESULTS: 04/08 Admission as above 04/08 CT chest: Multifocal severe pneumonia with RUL collapse and soft tissue obstructing RUL bronchus.  04/08 CTAP: Mild small bowel ileus, potential enteritis. Nasogastric tube terminates in distal stomach. Cholelithiasis without CT findings of acute cholecystitis.Consider bronchoscopy.  04/09 nephrology consultation: No acute indication for dialysis at present. 04/09 gastroenterology consultation: Recommend high-dose PPI therapy. 04/10 severely hypoxemic despite 100% FiO2.  Recruitment maneuver with significant improvement.  PEEP increased and ARDS strategy on ventilator implemented.  Neuromuscular blockade implemented due to ventilator dyssynchrony. 04/11 PAF with RVR > converted with amiodarone. Gas exchange improving. Off Nimbex 04/12 Echocardiogram: EF 20-25%.  Diffuse HK. 04/13 High airway pressure with vent dyssynchony; given Vecuronium; FIO2 increased to 50%  04/16 Cardiology consultation 04/13 Amiodarone D/C because of SB 04/15 increased HR, EF 20% afib with RVR 04/17 failed weaning trials, HR remains elevated 04/19 ENT consultation for trach tube placement 04/20 no significant changes in the last 24 hours. Has not tolerated any weaning  04/23 PEG placed. 04/23 intermittent ventilator dyssynchrony - sedation regimen adjusted 04/24 Somewhat more responsive. Not F/C 04/25 Trach tube placed 04/25 significantly more responsive 04/30 and hypoxemia and pulmonary  infiltrates 05/02 fever, leukocytosis 05/03 severe ARDS pattern on CXR 05/03 ID consultation 05/14 goals of care discussion with patient's sister and mother.  Agree to continue aggressive support but DNR in event of cardiac arrest. Received one unit RBCs for Hgb 6.4. For severe ARDS, NMB protocol initiated 05/15 Gas exchange much improved. Remains on NMB. Received one unit RBCs for Hgb 6.9 05/16 Nimbex DC'd. Cognition improved and + F/C. Gas exchange improved. Tolerating PCV mode of ventilation best. Further diuresis with furosemide and acetazolamide. Increased resp secretions > repeat resp culture sent  INDWELLING DEVICES: L femoral CVL 04/08 >> 4/16 ETT 04/08 >> 04/25 RUE PICC 4/15 >>  PEG 04/23 >>  Trach tube 04/25 >>   MICRO DATA: MRSA PCR 04/08, 04/20 >> POS, 05/08 >> NEG Urine 04/08 >> 10k coag negative staph Resp 04/09 >> c/w NOF Blood 04/08 >> NEG Blood 04/09 >> NEG Resp 04/20 >> NOF Urine 05/03 >> NEG  Resp 05/03 >> NOF Blood 05/03 >> 2/2 Klebsiella Blood 05/07 >> NEG Resp 05/13 >> NOF Resp 05/16 >>    ANTIMICROBIALS:  Unasyn 04/08 >> 04/10 Vanc 04/08 >> 4/15, 05/03 >> 05/10 Cefepime 04/10 >> 4/15 Metronidazole 04/10 >> 4/15 Meropenem 05/03 >> 05/10 Cefazolin 05/11 >> 05/14 Ceftriaxone 05/14 >> 05/16   SUBJECTIVE:  RASS 0. + F/C for RN. Tolerating PCV mode of MV  VITAL SIGNS: BP 137/72   Pulse (!) 106   Temp 98.9 F (37.2 C) (Oral)   Resp (!) 21   Ht  (1.778 m)   Wt 258 lb 6.1 oz (117.2 kg)   SpO2 92%   BMI 37.07 kg/m    VENTILATOR SETTINGS: Vent Mode: PCV FiO2 (%):  [50 %-60 %] 55 % Set Rate:  [18 bmp-35 bmp] 18 bmp Vt Set:  [400 mL] 400 mL PEEP:  [10 cmH20-12 cmH20] 12  cmH20  INTAKE / OUTPUT: I/O last 3 completed shifts: In: 2692.2 [I.V.:1642.2; Blood:630; NG/GT:120; IV Piggyback:300] Out: 3725 [Urine:3725]  PHYSICAL EXAMINATION: General: RASS 0, + F/C Neuro: PERRL, EOMI, MAEs HEENT: NCAT, sclerae white Cardiovascular: Regular,  no M Lungs: Few scattered rhonchi, no wheezes Abdomen: Nontender, + BS Extremities: Mild symmetric BLE, BLL edema  LABS:  BMET Recent Labs  Lab 02/11/18 0311 02/12/18 0453 02/13/18 0443  NA 140 141 139  K 3.6 4.5 4.3  CL 91* 97* 94*  CO2 42* 37* 37*  BUN 34* 38* 39*  CREATININE 0.76 0.73 0.76  GLUCOSE 150* 143* 165*    Electrolytes Recent Labs  Lab 02/09/18 0558 02/10/18 0509 02/11/18 0311 02/12/18 0453 02/13/18 0443  CALCIUM 8.0* 8.4* 8.6* 7.8* 8.5*  MG 1.8 2.0 1.8  --   --   PHOS 4.1 3.1 2.2*  --  4.1    CBC Recent Labs  Lab 02/11/18 0311 02/12/18 0453 02/13/18 0443  WBC 13.9* 15.7* 18.9*  HGB 6.4* 6.9* 8.1*  HCT 20.3* 22.3* 25.8*  PLT 359 310 352    Coag's No results for input(s): APTT, INR in the last 168 hours.  Sepsis Markers No results for input(s): LATICACIDVEN, PROCALCITON, O2SATVEN in the last 168 hours.  ABG Recent Labs  Lab 02/09/18 1612 02/10/18 0418 02/11/18 0500  PHART 7.35 7.49* 7.51*  PCO2ART 92* 67* 63*  PO2ART 52* 77* 66*    Liver Enzymes Recent Labs  Lab 02/11/18 0311 02/12/18 0453 02/13/18 0443  AST 41 25 24  ALT ALKPHOS 90 95 94  BILITOT 0.4 0.3 0.4  ALBUMIN 1.7* 1.6* 1.7*    Cardiac Enzymes No results for input(s): TROPONINI, PROBNP in the last 168 hours.  Glucose Recent Labs  Lab 02/12/18 1632 02/12/18 1948 02/12/18 2325 02/13/18 0317 02/13/18 0728 02/13/18 1137  GLUCAP 199* 177* 140* 170* 136* 156*    CXR: Overall improving ARDS pattern with confluence and LLL    ASSESSMENT / PLAN: PULMONARY A: Acute hypoxemic respiratory failure Aspiration pneumonia ARDS, recurrent Pulmonary edema Prolonged mechanical ventilation Tracheostomy tube status Purulent respiratory secretions 5/16 P:   Cont full vent support -PCV mode best tolerated Cont vent bundle Daily SBT if/when meets criteria  Continue nebulized bronchodilators Cont empiric methylprednisolone for possible amiodarone induced  lung injury  Dose reduced 5/16  CARDIOVASCULAR A:  Septic shock, resolved Paroxysmal atrial fibrillation - NSR presently Dilated cardiomyopathy, etiology unclear Hypertension Intermittent sinus tachycardia P:  Avoid amiodarone as potential contributor to lung injury Continue scheduled lisinopril and metoprolol Continue digoxin Continue hydralazine as needed to maintain SBP < 160 mmHg  RENAL A:   AKI, resolved Hypernatremia, resolved Hypervolemia/anasarca - improving Metabolic alkalosis due to loop diuresis, improving P: Monitor BMET intermittently Monitor I/Os Correct electrolytes as indicated  Continue free water per G-tube Furosemide X 1 dose, acetazolamide x1 dose 05/16  GASTROINTESTINAL A:   Abdominal distention, resolved Suspected UGIB, resolved G tube status Gastric ileus, resolved P:   SUP: Enteral PPI Resume TF protocol   HEMATOLOGIC A: ICU acquired anemia P: DVT px: enoxaparin Monitor CBC intermittently  Transfuse per usual guidelines   INFECTIOUS A:   Severe sepsis, resolved Aspiration pneumonia, treated Klebsiella pneumonia with bacteremia, treated  Possible new LLL VAP 5/16 P:   Monitor temp, WBC count Micro and abx as above   ENDOCRINE A:   Type 2 diabetes, adequately controlled P:   Continue Lantus Continue resistant scale SSI  NEUROLOGIC A:   Ventilator dyssynchrony -improved  ICU/ventilator associated discomfort History of schizophrenia History of TBI P:   RASS goal: -1, -2 Cont scheduled clonazepam Cont Duragesic patch - resumed 05/14 Cont quetiapine - resumed 05/14 Continue PRN fentanyl, midazolam    No family @ bedside   CCM time: 35 mins The above time includes time spent in consultation with patient and/or family members and reviewing care plan on multidisciplinary rounds  Billy Fischer, MD PCCM service Mobile [phone] Pager [phone] 02/13/2018 3:52 PM

## 2018-02-13 NOTE — Progress Notes (Signed)
Patient ID: Tommy Mathews, male   DOB: 1961/08/03, 57 y.o.   MRN: [id number]  Tommy Mathews PROGRESS NOTE  Pelham Hennick JME:[id number] DOB: 08-06-1961 DOA: 01/06/2018 PCP: Isaias Cowman, MD  HPI/Subjective: Patient down to 60% FiO2.  More responsive today.  Off paralytic agent.  Follow some simple commands.  Objective: Vitals:   02/13/18 1100 02/13/18 1127  BP:    Pulse: 89   Resp: (!) 23   Temp:    SpO2: 93% 93%    Filed Weights   01/26/18 0418 02/10/18 0500 02/13/18 0520  Weight: 133.2 kg (293 lb 10.4 oz) 116.4 kg (256 lb 9.9 oz) 117.2 kg (258 lb 6.1 oz)    ROS: Review of Systems  Unable to perform ROS: Acuity of condition   Exam: Physical Exam  HENT:  Nose: No mucosal edema.  Mouth/Throat: No oropharyngeal exudate or posterior oropharyngeal edema.  Eyes: Pupils are equal, round, and reactive to light. Conjunctivae and lids are normal.  Neck: No JVD present. Carotid bruit is not present. No edema present. No thyroid mass and no thyromegaly present.  Cardiovascular: S1 normal and S2 normal. Exam reveals no gallop.  No murmur heard. Respiratory: No respiratory distress. He has decreased breath sounds in the right middle field, the right lower field, the left middle field and the left lower field. He has no wheezes. He has rhonchi in the right middle field, the right lower field, the left middle field and the left lower field. He has no rales.  GI: Soft. Bowel sounds are normal. He exhibits distension. There is no tenderness.  Musculoskeletal:       Right wrist: He exhibits swelling.       Left wrist: He exhibits swelling.       Right ankle: He exhibits swelling.       Left ankle: He exhibits swelling.  Lymphadenopathy:    He has no cervical adenopathy.  Neurological: He is alert.  Able to squeeze my hands and wiggles toes.  Skin: Skin is warm. Nails show no clubbing.  As per nursing staff stage II decubitus on buttock  Psychiatric: His affect is blunt.       Data Reviewed: Basic Metabolic Panel: Recent Labs  Lab 02/08/18 0409 02/09/18 0558 02/10/18 0509 02/11/18 0311 02/12/18 0453 02/13/18 0443  NA 138 136 138 140 141 139  K 4.3 4.4 4.2 3.6 4.5 4.3  CL 94* 91* 89* 91* 97* 94*  CO2 39* 42* 43* 42* 37* 37*  GLUCOSE 130* 193* 191* 150* 143* 165*  BUN 22* 31* 35* 34* 38* 39*  CREATININE 0.67 0.78 0.75 0.76 0.73 0.76  CALCIUM 8.2* 8.0* 8.4* 8.6* 7.8* 8.5*  MG 1.7 1.8 2.0 1.8  --   --   PHOS 4.0 4.1 3.1 2.2*  --  4.1   Liver Function Tests: Recent Labs  Lab 02/07/18 0455 02/11/18 0311 02/12/18 0453 02/13/18 0443  AST  --  41 25 24  ALT  --  '27 23 22  ' ALKPHOS  --  90 95 94  BILITOT  --  0.4 0.3 0.4  PROT  --  6.4* 6.1* 6.3*  ALBUMIN 1.6* 1.7* 1.6* 1.7*   CBC: Recent Labs  Lab 02/07/18 0455  02/09/18 0558 02/10/18 0509 02/11/18 0311 02/12/18 0453 02/13/18 0443  WBC 14.8*   < > 11.9* 16.6* 13.9* 15.7* 18.9*  NEUTROABS 11.8*  --   --   --  10.3*  --   --   HGB 7.2*   < >  7.0* 7.4* 6.4* 6.9* 8.1*  HCT 22.5*   < > 20.5* 22.6* 20.3* 22.3* 25.8*  MCV 85.0   < > 84.8 84.1 85.1 86.7 85.6  PLT 480*   < > 361 430 359 310 352   < > = values in this interval not displayed.   BNP (last 3 results) Recent Labs    01/06/18 1944 01/16/18 2332 02/12/18 0453  BNP 37.0 402.0* 255.0*     CBG: Recent Labs  Lab 02/12/18 1948 02/12/18 2325 02/13/18 0317 02/13/18 0728 02/13/18 1137  GLUCAP 177* 140* 170* 136* 156*    Recent Results (from the past 240 hour(s))  CULTURE, BLOOD (ROUTINE X 2) w Reflex to ID Panel     Status: None   Collection Time: 02/04/18  5:41 PM  Result Value Ref Range Status   Specimen Description BLOOD BLOOD LEFT HAND  Final   Special Requests   Final    BOTTLES DRAWN AEROBIC AND ANAEROBIC Blood Culture adequate volume   Culture   Final    NO GROWTH 5 DAYS Performed at [facility], [address] [place], [place] [address]    Report Status 02/09/2018 FINAL  Final  CULTURE, BLOOD  (ROUTINE X 2) w Reflex to ID Panel     Status: None   Collection Time: 02/04/18  8:50 PM  Result Value Ref Range Status   Specimen Description BLOOD LEFT HAND  Final   Special Requests   Final    BOTTLES DRAWN AEROBIC AND ANAEROBIC Blood Culture adequate volume   Culture   Final    NO GROWTH 5 DAYS Performed at [facility], [address] [place], [place] [address]    Report Status 02/10/2018 FINAL  Final  MRSA PCR Screening     Status: None   Collection Time: 02/05/18  9:38 AM  Result Value Ref Range Status   MRSA by PCR NEGATIVE NEGATIVE Final    Comment:        The GeneXpert MRSA Assay (FDA approved for NASAL specimens only), is one component of a comprehensive MRSA colonization surveillance program. It is not intended to diagnose MRSA infection nor to guide or monitor treatment for MRSA infections. Performed at [facility], [address] [place], [place] [address]   Culture, respiratory (NON-Expectorated)     Status: None   Collection Time: 02/10/18 12:02 PM  Result Value Ref Range Status   Specimen Description   Final    TRACHEAL ASPIRATE Performed at [facility], [address] [place], [place] [address]    Special Requests   Final    NONE Performed at [facility], [address] [place], [place] [address]    Gram Stain   Final    MODERATE WBC PRESENT,BOTH PMN AND MONONUCLEAR NO ORGANISMS SEEN    Culture   Final    Consistent with normal respiratory flora. Performed at [facility], [place] [address], [place],  [address]    Report Status 02/12/2018 FINAL  Final     Studies: Dg Chest Port 1 View  Result Date: 02/13/2018 CLINICAL DATA:  Respiratory failure EXAM: PORTABLE CHEST 1 VIEW COMPARISON:  02/12/2018 FINDINGS: Tracheostomy is unchanged. Diffuse bilateral airspace opacities are again noted. Cardiomegaly. No visible significant effusions. No significant change since prior study. IMPRESSION: Diffuse  bilateral airspace disease could reflect edema or infection. No change. Electronically Signed   By: Rolm Baptise M.D.   On: 02/13/2018 09:43   Dg Chest [facility]  1 View  Result Date: 02/12/2018 CLINICAL DATA:  Respiratory failure. EXAM: PORTABLE CHEST 1 VIEW COMPARISON:  Radiograph of Feb 10, 2018. FINDINGS: Stable cardiomediastinal silhouette. Tracheostomy tube is in grossly good position. Stable right-sided PICC line. Stable diffuse bilateral lung opacities are noted concerning for pneumonia or edema. No pneumothorax is noted. Bony thorax is unremarkable. IMPRESSION: Stable bilateral lung opacities as described above. Electronically Signed   By: Marijo Conception, M.D.   On: 02/12/2018 07:07    Scheduled Meds: . acetaZOLAMIDE  500 mg Intravenous Once  . budesonide (PULMICORT) nebulizer solution  0.25 mg Nebulization Q6H  . chlorhexidine gluconate (MEDLINE KIT)  15 mL Mouth Rinse BID  . clonazePAM  2 mg Per Tube BID  . digoxin  0.25 mg Per Tube Daily  . enoxaparin (LOVENOX) injection  40 mg Subcutaneous Daily  . fentaNYL  100 mcg Transdermal Q72H  . free water  200 mL Per Tube Q8H  . gabapentin  200 mg Per Tube Q8H  . insulin aspart  0-20 Units Subcutaneous Q4H  . insulin glargine  40 Units Subcutaneous QHS  . ipratropium-albuterol  3 mL Nebulization Q6H  . lisinopril  10 mg Per Tube Daily  . mouth rinse  15 mL Mouth Rinse 10 times per day  . methylPREDNISolone (SOLU-MEDROL) injection  40 mg Intravenous Q12H  . metoprolol tartrate  12.5 mg Per Tube Q6H  . nutrition supplement (JUVEN)  1 packet Per Tube BID  . nystatin  5 mL Oral QID  . pantoprazole sodium  40 mg Per Tube Daily  . QUEtiapine  100 mg Oral BID  . valproic acid  1,000 mg Per Tube BID   Continuous Infusions: . feeding supplement (GLUCERNA 1.5 CAL) 1,000 mL (02/13/18 1129)    Assessment/Plan:  1. Acute hypoxic respiratory failure concerning for ARDS.  Patient still on 60% FiO2.  Patient made a DNR.  On steroids.  Given 1 dose  of Lasix.  Patient off paralytics and on sedation only. 2. Ventilator associated pneumonia with sepsis with Klebsiella.  Finished antibiotics. 3. Atrial fibrillation and episodes of SVT.  Heart rate improved on digoxin and metoprolol.  Amiodarone discontinued secondary to worsening respiratory status. 4. Acute on chronic systolic congestive heart failure with anasarca.  given dose of  Lasix IV today.  On lisinopril and metoprolol. 5. Acute encephalopathy with history of traumatic brain injury. 6. Recent GI bleed on PPI.  Endoscopy negative. patient was given a unit of blood yesterday. 7. Acute kidney injury.  Has resolved 8. Type 2 diabetes mellitus on glargine insulin and sliding scale 9. Nutrition.  On tube feeds as per intensivist 10. Depression on Depakene, Seroquel 11. Hyperlipidemia unspecified on atorvastatin 12. Stage II decubitus on buttock  Code Status:     Code Status Orders  (From admission, onward)        Start     Ordered   01/06/18 2227  Full code  Continuous     01/06/18 2226    Code Status History    This patient has a current code status but no historical code status.     Family Communication:  mother and sister at the bedside yesterday   disposition Plan: To be determined  Consultants:  Critical care specialist  Infectious disease  Time spent: 25 minutes.  Jacquette Canales [employer]         is

## 2018-02-14 DIAGNOSIS — R6521 Severe sepsis with septic shock: Secondary | ICD-10-CM

## 2018-02-14 DIAGNOSIS — A419 Sepsis, unspecified organism: Secondary | ICD-10-CM

## 2018-02-14 LAB — COMPREHENSIVE METABOLIC PANEL
ALK PHOS: 84 U/L (ref 38–126)
ALT: 28 U/L (ref 17–63)
AST: 31 U/L (ref 15–41)
Albumin: 1.7 g/dL — ABNORMAL LOW (ref 3.5–5.0)
Anion gap: 7 (ref 5–15)
BUN: 40 mg/dL — AB (ref 6–20)
CALCIUM: 8.1 mg/dL — AB (ref 8.9–10.3)
CHLORIDE: 103 mmol/L (ref 101–111)
CO2: 33 mmol/L — ABNORMAL HIGH (ref 22–32)
Creatinine, Ser: 0.56 mg/dL — ABNORMAL LOW (ref 0.61–1.24)
Glucose, Bld: 184 mg/dL — ABNORMAL HIGH (ref 65–99)
Potassium: 3.7 mmol/L (ref 3.5–5.1)
Sodium: 143 mmol/L (ref 135–145)
Total Bilirubin: 0.5 mg/dL (ref 0.3–1.2)
Total Protein: 6 g/dL — ABNORMAL LOW (ref 6.5–8.1)

## 2018-02-14 LAB — GLUCOSE, CAPILLARY
GLUCOSE-CAPILLARY: 179 mg/dL — AB (ref 65–99)
GLUCOSE-CAPILLARY: 185 mg/dL — AB (ref 65–99)
GLUCOSE-CAPILLARY: 166 mg/dL — AB (ref 65–99)
Glucose-Capillary: 135 mg/dL — ABNORMAL HIGH (ref 65–99)
Glucose-Capillary: 220 mg/dL — ABNORMAL HIGH (ref 65–99)
Glucose-Capillary: 250 mg/dL — ABNORMAL HIGH (ref 65–99)
Glucose-Capillary: 90 mg/dL (ref 65–99)

## 2018-02-14 LAB — CBC
HEMATOCRIT: 27.6 % — AB (ref 40.0–52.0)
HEMOGLOBIN: 8.7 g/dL — AB (ref 13.0–18.0)
MCH: 27.6 pg (ref 26.0–34.0)
MCHC: 31.7 g/dL — ABNORMAL LOW (ref 32.0–36.0)
MCV: 86.9 fL (ref 80.0–100.0)
Platelets: 307 10*3/uL (ref 150–440)
RBC: 3.17 MIL/uL — AB (ref 4.40–5.90)
RDW: 18.1 % — ABNORMAL HIGH (ref 11.5–14.5)
WBC: 13.9 10*3/uL — AB (ref 3.8–10.6)

## 2018-02-14 LAB — MAGNESIUM: MAGNESIUM: 1.7 mg/dL (ref 1.7–2.4)

## 2018-02-14 LAB — DIGOXIN LEVEL: DIGOXIN LVL: 1 ng/mL (ref 0.8–2.0)

## 2018-02-14 MED ORDER — HYDROCORTISONE NA SUCCINATE PF 100 MG IJ SOLR
25.0000 mg | Freq: Two times a day (BID) | INTRAMUSCULAR | Status: DC
Start: 2018-02-14 — End: 2018-02-17
  Administered 2018-02-14 – 2018-02-16 (×6): 25 mg via INTRAVENOUS
  Filled 2018-02-14 (×6): qty 2

## 2018-02-14 MED ORDER — SENNOSIDES 8.8 MG/5ML PO SYRP
5.0000 mL | ORAL_SOLUTION | Freq: Every day | ORAL | Status: DC
  Administered 2018-02-14 – 2018-02-17 (×4): 5 mL
  Filled 2018-02-14 (×5): qty 5

## 2018-02-14 MED ORDER — DOCUSATE SODIUM 50 MG/5ML PO LIQD
100.0000 mg | Freq: Two times a day (BID) | ORAL | Status: DC
  Administered 2018-02-14 – 2018-02-17 (×7): 100 mg
  Filled 2018-02-14 (×5): qty 10

## 2018-02-14 MED ORDER — INSULIN GLARGINE 100 UNIT/ML ~~LOC~~ SOLN
20.0000 [IU] | Freq: Every day | SUBCUTANEOUS | Status: DC
  Administered 2018-02-14 – 2018-03-18 (×33): 20 [IU] via SUBCUTANEOUS
  Filled 2018-02-14 (×34): qty 0.2

## 2018-02-14 NOTE — Progress Notes (Signed)
Patient ID: Tommy Mathews, male   DOB: 13-Dec-1960, 57 y.o.   MRN: [id number]  [facility] PROGRESS NOTE  Tommy Mathews VZD:[id number] DOB: 08/08/1961 DOA: 01/06/2018 PCP: Tommy Cowman, MD  HPI/Subjective: Patient down to 60% FiO2.  Sedated today.  Objective: Vitals:   02/14/18 1300 02/14/18 1430  BP: 130/67 137/69  Pulse: 61 70  Resp: (!) 24   Temp:    SpO2: 99%     Filed Weights   02/10/18 0500 02/13/18 0520 02/14/18 0502  Weight: 116.4 kg (256 lb 9.9 oz) 117.2 kg (258 lb 6.1 oz) 116 kg (255 lb 11.7 oz)    ROS: Review of Systems  Unable to perform ROS: Acuity of condition   Exam: Physical Exam  Constitutional: He appears lethargic.  HENT:  Nose: No mucosal edema.  Mouth/Throat: No oropharyngeal exudate or posterior oropharyngeal edema.  Eyes: Pupils are equal, round, and reactive to light. Conjunctivae and lids are normal.  Neck: No JVD present. Carotid bruit is not present. No edema present. No thyroid mass and no thyromegaly present.  Cardiovascular: S1 normal and S2 normal. Exam reveals no gallop.  No murmur heard. Respiratory: No respiratory distress. He has decreased breath sounds in the right middle field, the right lower field, the left middle field and the left lower field. He has no wheezes. He has rhonchi in the right lower field and the left lower field. He has no rales.  GI: Soft. Bowel sounds are normal. He exhibits distension. There is no tenderness.  Musculoskeletal:       Right wrist: He exhibits swelling.       Left wrist: He exhibits swelling.       Right ankle: He exhibits swelling.       Left ankle: He exhibits swelling.  Lymphadenopathy:    He has no cervical adenopathy.  Neurological: He appears lethargic.  Sedated  Skin: Skin is warm. Nails show no clubbing.  As per nursing staff stage II decubitus on buttock  Psychiatric:  Sedated      Data Reviewed: Basic Metabolic Panel: Recent Labs  Lab 02/08/18 0409 02/09/18 0558  02/10/18 0509 02/11/18 0311 02/12/18 0453 02/13/18 0443 02/14/18 0632  NA 138 136 138 140 141 139 143  K 4.3 4.4 4.2 3.6 4.5 4.3 3.7  CL 94* 91* 89* 91* 97* 94* 103  CO2 39* 42* 43* 42* 37* 37* 33*  GLUCOSE 130* 193* 191* 150* 143* 165* 184*  BUN 22* 31* 35* 34* 38* 39* 40*  CREATININE 0.67 0.78 0.75 0.76 0.73 0.76 0.56*  CALCIUM 8.2* 8.0* 8.4* 8.6* 7.8* 8.5* 8.1*  MG 1.7 1.8 2.0 1.8  --   --  1.7  PHOS 4.0 4.1 3.1 2.2*  --  4.1  --    Liver Function Tests: Recent Labs  Lab 02/11/18 0311 02/12/18 0453 02/13/18 0443 02/14/18 0632  AST 41 _0 ALT _1 ALKPHOS 90 95 94 84  BILITOT 0.4 0.3 0.4 0.5  PROT 6.4* 6.1* 6.3* 6.0*  ALBUMIN 1.7* 1.6* 1.7* 1.7*   CBC: Recent Labs  Lab 02/10/18 0509 02/11/18 0311 02/12/18 0453 02/13/18 0443 02/14/18 0632  WBC 16.6* 13.9* 15.7* 18.9* 13.9*  NEUTROABS  --  10.3*  --   --   --   HGB 7.4* 6.4* 6.9* 8.1* 8.7*  HCT 22.6* 20.3* 22.3* 25.8* 27.6*  MCV 84.1 85.1 86.7 85.6 86.9  PLT 430 359 310 352 307   BNP (last 3 results) Recent Labs  01/06/18 1944 01/16/18 2332 02/12/18 0453  BNP 37.0 402.0* 255.0*     CBG: Recent Labs  Lab 02/13/18 1941 02/13/18 2355 02/14/18 0346 02/14/18 0742 02/14/18 1123  GLUCAP 107* 90 135* 220* 250*    Recent Results (from the past 240 hour(s))  CULTURE, BLOOD (ROUTINE X 2) w Reflex to ID Panel     Status: None   Collection Time: 02/04/18  5:41 PM  Result Value Ref Range Status   Specimen Description BLOOD BLOOD LEFT HAND  Final   Special Requests   Final    BOTTLES DRAWN AEROBIC AND ANAEROBIC Blood Culture adequate volume   Culture   Final    NO GROWTH 5 DAYS Performed at [facility], [address] [place], [place] [address]    Report Status 02/09/2018 FINAL  Final  CULTURE, BLOOD (ROUTINE X 2) w Reflex to ID Panel     Status: None   Collection Time: 02/04/18  8:50 PM  Result Value Ref Range Status   Specimen Description BLOOD LEFT HAND  Final    Special Requests   Final    BOTTLES DRAWN AEROBIC AND ANAEROBIC Blood Culture adequate volume   Culture   Final    NO GROWTH 5 DAYS Performed at [facility], [address] [place], [place] [address]    Report Status 02/10/2018 FINAL  Final  MRSA PCR Screening     Status: None   Collection Time: 02/05/18  9:38 AM  Result Value Ref Range Status   MRSA by PCR NEGATIVE NEGATIVE Final    Comment:        The GeneXpert MRSA Assay (FDA approved for NASAL specimens only), is one component of a comprehensive MRSA colonization surveillance program. It is not intended to diagnose MRSA infection nor to guide or monitor treatment for MRSA infections. Performed at [facility], [address] [place], [place] [address]   Culture, respiratory (NON-Expectorated)     Status: None   Collection Time: 02/10/18 12:02 PM  Result Value Ref Range Status   Specimen Description   Final    TRACHEAL ASPIRATE Performed at [facility], [address] [place], [place] [address]    Special Requests   Final    NONE Performed at [facility], [address] [place], [place] [address]    Gram Stain   Final    MODERATE WBC PRESENT,BOTH PMN AND MONONUCLEAR NO ORGANISMS SEEN    Culture   Final    Consistent with normal respiratory flora. Performed at [facility], [address] [address] [place], [place] [address]    Report Status 02/12/2018 FINAL  Final  Culture, respiratory (NON-Expectorated)     Status: None (Preliminary result)   Collection Time: 02/13/18  1:42 PM  Result Value Ref Range Status   Specimen Description   Final    TRACHEAL ASPIRATE Performed at [facility], [address] [place], [place] [address]    Special Requests   Final    NONE Performed at [facility], [address] [place], [place] [address]    Gram Stain   Final    ABUNDANT WBC PRESENT,BOTH PMN AND MONONUCLEAR RARE YEAST RARE GRAM VARIABLE ROD     Culture   Final    CULTURE REINCUBATED FOR BETTER GROWTH Performed at [facility], [place] [address], [place], [place] [address]    Report Status PENDING  Incomplete     Studies: Dg Chest [facility] [address]  Result Date: 02/13/2018 CLINICAL DATA:  Respiratory failure EXAM: PORTABLE CHEST 1 VIEW COMPARISON:  02/12/2018 FINDINGS: Tracheostomy is unchanged. Diffuse bilateral airspace opacities are again noted. Cardiomegaly. No visible significant effusions. No significant change since prior study. IMPRESSION: Diffuse bilateral airspace disease could reflect edema or infection. No change. Electronically Signed   By: Rolm Baptise M.D.   On: 02/13/2018 09:43    Scheduled Meds: . budesonide (PULMICORT) nebulizer solution  0.25 mg Nebulization Q6H  . chlorhexidine gluconate (MEDLINE KIT)  15 mL Mouth Rinse BID  . clonazePAM  2 mg Per Tube BID  . digoxin  0.25 mg Per Tube Daily  . docusate  100 mg Per Tube BID  . enoxaparin (LOVENOX) injection  40 mg Subcutaneous Daily  . fentaNYL  100 mcg Transdermal Q72H  . free water  200 mL Per Tube Q8H  . gabapentin  200 mg Per Tube Q8H  . hydrocortisone sod succinate (SOLU-CORTEF) inj  25 mg Intravenous Q12H  . insulin aspart  0-20 Units Subcutaneous Q4H  . insulin glargine  20 Units Subcutaneous QHS  . ipratropium-albuterol  3 mL Nebulization Q6H  . lisinopril  10 mg Per Tube Daily  . mouth rinse  15 mL Mouth Rinse 10 times per day  . metoprolol tartrate  12.5 mg Per Tube Q6H  . nutrition supplement (JUVEN)  1 packet Per Tube BID  . nystatin  5 mL Oral QID  . pantoprazole sodium  40 mg Per Tube Daily  . QUEtiapine  100 mg Oral BID  . sennosides  5 mL Per Tube QHS  . valproic acid  1,000 mg Per Tube BID   Continuous Infusions: . dexmedetomidine (PRECEDEX) IV infusion 2 mcg/kg/hr (02/14/18 1403)  . feeding supplement (GLUCERNA 1.5 CAL) 1,000 mL (02/14/18 1403)    Assessment/Plan:  1. Acute hypoxic respiratory failure concerning for ARDS.  Patient  still on 60% FiO2.  Patient made a DNR.  On Solu-Cortef taper.  Patient on sedation with Precedex today.  As per nursing staff does have desaturation episodes and mucous plugging. 2. Ventilator associated pneumonia with sepsis with Klebsiella.  Finished antibiotics. 3. Atrial fibrillation and episodes of SVT.  Heart rate improved on digoxin and metoprolol.  Amiodarone discontinued secondary to worsening respiratory status. 4. Acute on chronic systolic congestive heart failure with anasarca.   On lisinopril and metoprolol. 5. Acute encephalopathy with history of traumatic brain injury. 6. Recent GI bleed on PPI.  Endoscopy negative.  Continue to watch hemoglobin closely.  Received a unit of blood yesterday.  Today's hemoglobin 8.7. 7. Acute kidney injury.  Has resolved 8. Type 2 diabetes mellitus on glargine insulin and sliding scale 9. Nutrition.  On tube feeds as per intensivist 10. Depression on Depakene, Seroquel 11. Stage II decubitus on buttock  Code Status:     Code Status Orders  (From admission, onward)        Start     Ordered   01/06/18 2227  Full code  Continuous     01/06/18 2226    Code Status History    This patient has a current code status but no historical code status.     Family Communication: As per critical care specialist  disposition Plan: To be determined  Consultants:  Critical care specialist  Infectious disease  Time spent: 23 minutes.  Tommy Mathews [employer]

## 2018-02-14 NOTE — Progress Notes (Signed)
PULMONARY / CRITICAL CARE MEDICINE   Name: Lamari Beckles MRN: [id number] DOB: [birth year]    ADMISSION DATE:  01/06/2018   PT PROFILE:  67 M SNF resident due to psychiatric illness and history of TBI admitted with acute hypoxemic respiratory failure, severe bilateral pulmonary infiltrates, severe sepsis/septic shock.  Intubated in the ED shortly after arrival.  Was also noted to have dark coffee-ground material suctioned from stomach.  Patient chronically on rivaroxaban.   MAJOR EVENTS/TEST RESULTS: 04/08 Admission as above 04/08 CT chest: Multifocal severe pneumonia with RUL collapse and soft tissue obstructing RUL bronchus.  04/08 CTAP: Mild small bowel ileus, potential enteritis. Nasogastric tube terminates in distal stomach. Cholelithiasis without CT findings of acute cholecystitis.Consider bronchoscopy.  04/09 nephrology consultation: No acute indication for dialysis at present. 04/09 gastroenterology consultation: Recommend high-dose PPI therapy. 04/10 severely hypoxemic despite 100% FiO2.  Recruitment maneuver with significant improvement.  PEEP increased and ARDS strategy on ventilator implemented.  Neuromuscular blockade implemented due to ventilator dyssynchrony. 04/11 PAF with RVR > converted with amiodarone. Gas exchange improving. Off Nimbex 04/12 Echocardiogram: EF 20-25%.  Diffuse HK. 04/13 High airway pressure with vent dyssynchony; given Vecuronium; FIO2 increased to 50%  04/16 Cardiology consultation 04/13 Amiodarone D/C because of SB 04/15 increased HR, EF 20% afib with RVR 04/17 failed weaning trials, HR remains elevated 04/19 ENT consultation for trach tube placement 04/20 no significant changes in the last 24 hours. Has not tolerated any weaning  04/23 PEG placed. 04/23 intermittent ventilator dyssynchrony - sedation regimen adjusted 04/24 Somewhat more responsive. Not F/C 04/25 Trach tube placed 04/25 significantly more responsive 04/30 and hypoxemia and pulmonary  infiltrates 05/02 fever, leukocytosis 05/03 severe ARDS pattern on CXR 05/03 ID consultation 05/14 goals of care discussion with patient's sister and mother.  Agree to continue aggressive support but DNR in event of cardiac arrest. Received one unit RBCs for Hgb 6.4. For severe ARDS, NMB protocol initiated 05/15 Gas exchange much improved. Remains on NMB. Received one unit RBCs for Hgb 6.9    INDWELLING DEVICES: L femoral CVL 04/08 >> 4/16 ETT 04/08 >> 04/25 RUE PICC 4/15 >>  PEG 04/23 >>  Trach tube 04/25 >>   MICRO DATA: MRSA PCR 04/08, 04/20 >> POS, 05/08 >> NEG Urine 04/08 >> 10k coag negative staph Resp 04/09 >> c/w NOF Blood 04/08 >> NEG Blood 04/09 >> NEG Resp 04/20 >> NOF Urine 05/03 >> NEG  Resp 05/03 >> NOF Blood 05/03 >> 2/2 Klebsiella Blood 05/07 >> NEG Resp 05/13 >> NOF   ANTIMICROBIALS:  Unasyn 04/08 >> 04/10 Vanc 04/08 >> 4/15, 05/03 >> 05/10 Cefepime 04/10 >> 4/15 Metronidazole 04/10 >> 4/15 Meropenem 05/03 >> 05/10 Cefazolin 05/11 >> 05/14 Ceftriaxone 05/14 >>    SUBJECTIVE:  Patient without significant change. Still requiring high PEEP and FiO2 requirements. Patient is responsive difficult to communicate with on Precedex and mechanical ventilation  VITAL SIGNS: BP (!) 142/69   Pulse 66   Temp 98.8 F (37.1 C) (Oral)   Resp (!) 23   Ht  (1.778 m)   Wt 255 lb 11.7 oz (116 kg)   SpO2 96%   BMI 36.69 kg/m    VENTILATOR SETTINGS: Vent Mode: PCV FiO2 (%):  [50 %-70 %] 70 % Set Rate:  [18 bmp] 18 bmp PEEP:  [10 cmH20-12 cmH20] 12 cmH20  INTAKE / OUTPUT: I/O last 3 completed shifts: In: 2001.9 [I.V.:1047.5; NG/GT:954.4] Out: 4650 [Urine:4650]  PHYSICAL EXAMINATION: General: RASS N/A on NMB Neuro: PERRL HEENT: NCAT,  sclerae white Cardiovascular: Regular, no M Lungs: No wheezes, few crackles dependently Abdomen: Nontender, + BS GU: minimal scrotal edema Extremities: Symmetric BLE edema  LABS:  BMET Recent Labs  Lab  02/12/18 0453 02/13/18 0443 02/14/18 0632  NA 141 139 143  K 4.5 4.3 3.7  CL 97* 94* 103  CO2 37* 37* 33*  BUN 38* 39* 40*  CREATININE 0.73 0.76 0.56*  GLUCOSE 143* 165* 184*    Electrolytes Recent Labs  Lab 02/10/18 0509 02/11/18 0311 02/12/18 0453 02/13/18 0443 02/14/18 0632  CALCIUM 8.4* 8.6* 7.8* 8.5* 8.1*  MG 2.0 1.8  --   --  1.7  PHOS 3.1 2.2*  --  4.1  --     CBC Recent Labs  Lab 02/12/18 0453 02/13/18 0443 02/14/18 0632  WBC 15.7* 18.9* 13.9*  HGB 6.9* 8.1* 8.7*  HCT 22.3* 25.8* 27.6*  PLT 310 352 307    Coag's No results for input(s): APTT, INR in the last 168 hours.  Sepsis Markers No results for input(s): LATICACIDVEN, PROCALCITON, O2SATVEN in the last 168 hours.  ABG Recent Labs  Lab 02/09/18 1612 02/10/18 0418 02/11/18 0500  PHART 7.35 7.49* 7.51*  PCO2ART 92* 67* 63*  PO2ART 52* 77* 66*    Liver Enzymes Recent Labs  Lab 02/12/18 0453 02/13/18 0443 02/14/18 0632  AST ALT ALKPHOS 95 94 84  BILITOT 0.3 0.4 0.5  ALBUMIN 1.6* 1.7* 1.7*    Cardiac Enzymes No results for input(s): TROPONINI, PROBNP in the last 168 hours.  Glucose Recent Labs  Lab 02/13/18 1137 02/13/18 1644 02/13/18 1941 02/13/18 2355 02/14/18 0346 02/14/18 0742  GLUCAP 156* 128* 107* 90 135* 220*    CXR: ARDS pattern    ASSESSMENT / PLAN: PULMONARY A: Acute hypoxemic respiratory failure Aspiration pneumonia ARDS, recurrent Pulmonary edema Prolonged mechanical ventilation Tracheostomy tube status P:   Cont full vent support - Lung protection strategy Cont vent bundle Daily SBT if/when meets criteria  Continue nebulized bronchodilators Cont empiric methylprednisolone for possible amiodarone induced lung injury  CARDIOVASCULAR A: septic shock, resolved Paroxysmal atrial fibrillation - NSR presently Dilated cardiomyopathy, etiology unclear Hypertension Intermittent sinus tachycardia P:  MAP goal > 65 mmHg Discontinue  amiodarone as it might be contributing to lung injury Resume hydrochlorothiazide at reduced dose Continue scheduled metoprolol Resume lisinopril Continue hydralazine as needed to maintain SBP < 160 mmHg  RENAL A:   AKI, resolved Hypernatremia, resolved Hypervolemia/anasarca - improving Metabolic alkalosis due to loop diuresis, improving P: Monitor BMET intermittently Monitor I/Os Correct electrolytes as indicated  Continue free water per G-tube Furosemide X 1 dose 05/15  GASTROINTESTINAL A:   Abdominal distention, resolved Suspected UGIB, resolved G tube status Gastric ileus  P:   SUP: Enteral PPI Continue TF protocol - holding presently  HEMATOLOGIC A: ICU acquired anemia P: DVT px: enoxaparin Monitor CBC intermittently  Transfuse per usual guidelines   INFECTIOUS A:   Severe sepsis with shock, resolved Aspiration pneumonia, treated Klebsiella pneumonia with bacteremia P:   Monitor temp, WBC count Micro and abx as above   ENDOCRINE A:   Type 2 diabetes, adequately controlled P:   Continue Lantus - increased 05/14 with initiation of methylpred Continue resistant scale SSI  NEUROLOGIC A:   Ventilator dyssynchrony ICU/ventilator associated discomfort History of schizophrenia History of TBI P:   RASS goal: N/A Cont clonazepam Cont Duragesic patch - resumed 05/14 Cont quetiapine - resumed 05/14 Cont fent, midaz infusions while on NMB  BIS goal 40-60   Family updated @ bedside   CCM time: 35 mins  The above time includes time spent in consultation with patient and/or family members and reviewing care plan on multidisciplinary rounds  Tora Kindred, DO

## 2018-02-15 LAB — BASIC METABOLIC PANEL
Anion gap: 6 (ref 5–15)
BUN: 38 mg/dL — ABNORMAL HIGH (ref 6–20)
CO2: 31 mmol/L (ref 22–32)
CREATININE: 0.59 mg/dL — AB (ref 0.61–1.24)
Calcium: 7.5 mg/dL — ABNORMAL LOW (ref 8.9–10.3)
Chloride: 109 mmol/L (ref 101–111)
GFR calc Af Amer: >60 mL/min (ref >60)
Glucose, Bld: 169 mg/dL — ABNORMAL HIGH (ref 65–99)
POTASSIUM: 3.3 mmol/L — AB (ref 3.5–5.1)
SODIUM: 146 mmol/L — AB (ref 135–145)

## 2018-02-15 LAB — GLUCOSE, CAPILLARY
GLUCOSE-CAPILLARY: 152 mg/dL — AB (ref 65–99)
GLUCOSE-CAPILLARY: 144 mg/dL — AB (ref 65–99)
GLUCOSE-CAPILLARY: 187 mg/dL — AB (ref 65–99)
Glucose-Capillary: 186 mg/dL — ABNORMAL HIGH (ref 65–99)
Glucose-Capillary: 178 mg/dL — ABNORMAL HIGH (ref 65–99)
Glucose-Capillary: 174 mg/dL — ABNORMAL HIGH (ref 65–99)

## 2018-02-15 LAB — AMMONIA: Ammonia: 24 umol/L (ref 9–35)

## 2018-02-15 LAB — MAGNESIUM: MAGNESIUM: 1.6 mg/dL — AB (ref 1.7–2.4)

## 2018-02-15 LAB — CBC WITH DIFFERENTIAL/PLATELET
BASOS PCT: 1 %
Basophils Absolute: 0.1 10*3/uL (ref 0–0.1)
EOS ABS: 0 10*3/uL (ref 0–0.7)
EOS PCT: 0 %
HCT: 26.6 % — ABNORMAL LOW (ref 40.0–52.0)
Hemoglobin: 8.3 g/dL — ABNORMAL LOW (ref 13.0–18.0)
Lymphocytes Relative: 7 %
Lymphs Abs: 1 10*3/uL (ref 1.0–3.6)
MCH: 27 pg (ref 26.0–34.0)
MCHC: 31.1 g/dL — ABNORMAL LOW (ref 32.0–36.0)
MCV: 86.8 fL (ref 80.0–100.0)
MONO ABS: 1.1 10*3/uL — AB (ref 0.2–1.0)
Monocytes Relative: 8 %
Neutro Abs: 11.6 10*3/uL — ABNORMAL HIGH (ref 1.4–6.5)
Neutrophils Relative %: 84 %
PLATELETS: 329 10*3/uL (ref 150–440)
RBC: 3.07 MIL/uL — ABNORMAL LOW (ref 4.40–5.90)
RDW: 18.4 % — AB (ref 11.5–14.5)
WBC: 13.8 10*3/uL — ABNORMAL HIGH (ref 3.8–10.6)

## 2018-02-15 LAB — VALPROIC ACID LEVEL: Valproic Acid Lvl: 33 ug/mL — ABNORMAL LOW (ref 50.0–100.0)

## 2018-02-15 MED ORDER — POTASSIUM CHLORIDE 10 MEQ/50ML IV SOLN
10.0000 meq | INTRAVENOUS | Status: AC
  Administered 2018-02-15 (×2): 10 meq via INTRAVENOUS
  Filled 2018-02-15 (×2): qty 50

## 2018-02-15 MED ORDER — INSULIN ASPART 100 UNIT/ML ~~LOC~~ SOLN
SUBCUTANEOUS | Status: AC
  Filled 2018-02-15: qty 1

## 2018-02-15 MED ORDER — MAGNESIUM SULFATE 2 GM/50ML IV SOLN
2.0000 g | Freq: Once | INTRAVENOUS | Status: AC
  Administered 2018-02-15: 2 g via INTRAVENOUS
  Filled 2018-02-15: qty 50

## 2018-02-15 MED ORDER — QUETIAPINE FUMARATE 25 MG PO TABS
200.0000 mg | ORAL_TABLET | Freq: Once | ORAL | Status: AC
  Administered 2018-02-15: 200 mg via ORAL
  Filled 2018-02-15: qty 8

## 2018-02-15 MED ORDER — QUETIAPINE FUMARATE 100 MG PO TABS
300.0000 mg | ORAL_TABLET | Freq: Every day | ORAL | Status: AC
  Administered 2018-02-15: 300 mg via ORAL
  Filled 2018-02-15: qty 1

## 2018-02-15 MED ORDER — QUETIAPINE FUMARATE 200 MG PO TABS
400.0000 mg | ORAL_TABLET | Freq: Two times a day (BID) | ORAL | Status: DC
  Administered 2018-02-16: 400 mg via ORAL
  Filled 2018-02-15: qty 2

## 2018-02-15 NOTE — Progress Notes (Signed)
Patient ID: Tommy Mathews, male   DOB: 16-Feb-1961, 57 y.o.   MRN: [id number]  [facility] PROGRESS NOTE  Tommy Mathews [id number] DOB: October 27, 1960 DOA: 01/06/2018 PCP: Tommy Cowman, MD  HPI/Subjective: Patient down to 60% FiO2.  Lethargic  Objective: Vitals:   02/15/18 1118 02/15/18 1300  BP:    Pulse:    Resp:    Temp:    SpO2: 94% 96%    Filed Weights   02/13/18 0520 02/14/18 0502 02/15/18 0334  Weight: 117.2 kg (258 lb 6.1 oz) 116 kg (255 lb 11.7 oz) 117.4 kg (258 lb 13.1 oz)    ROS: Review of Systems  Unable to perform ROS: Acuity of condition   Exam: Physical Exam  Constitutional: He appears lethargic.  HENT:  Nose: No mucosal edema.  Mouth/Throat: No oropharyngeal exudate or posterior oropharyngeal edema.  Tracheostomy site intact  Eyes: Pupils are equal, round, and reactive to light. Conjunctivae and lids are normal.  Neck: No JVD present. Carotid bruit is not present. No edema present. No thyroid mass and no thyromegaly present.  Cardiovascular: S1 normal and S2 normal. Exam reveals no gallop.  No murmur heard. Respiratory: No respiratory distress. He has decreased breath sounds in the right middle field, the right lower field, the left middle field and the left lower field. He has no wheezes. He has rhonchi in the right lower field and the left lower field. He has no rales.  GI: Soft. Bowel sounds are normal. He exhibits distension. There is no tenderness.  Musculoskeletal:       Right wrist: He exhibits swelling.       Left wrist: He exhibits swelling.       Right ankle: He exhibits swelling.       Left ankle: He exhibits swelling.  Lymphadenopathy:    He has no cervical adenopathy.  Neurological: He appears lethargic.  Sedated  Skin: Skin is warm. Nails show no clubbing.  As per nursing staff stage II decubitus on buttock  Psychiatric:  Sedated      Data Reviewed: Basic Metabolic Panel: Recent Labs  Lab 02/09/18 0558 02/10/18 0509  02/11/18 0311 02/12/18 0453 02/13/18 0443 02/14/18 0632 02/15/18 0538  NA 136 138 140 141 139 143 146*  K 4.4 4.2 3.6 4.5 4.3 3.7 3.3*  CL 91* 89* 91* 97* 94* 103 109  CO2 42* 43* 42* 37* 37* 33* 31  GLUCOSE 193* 191* 150* 143* 165* 184* 169*  BUN 31* 35* 34* 38* 39* 40* 38*  CREATININE 0.78 0.75 0.76 0.73 0.76 0.56* 0.59*  CALCIUM 8.0* 8.4* 8.6* 7.8* 8.5* 8.1* 7.5*  MG 1.8 2.0 1.8  --   --  1.7 1.6*  PHOS 4.1 3.1 2.2*  --  4.1  --   --    Liver Function Tests: Recent Labs  Lab 02/11/18 0311 02/12/18 0453 02/13/18 0443 02/14/18 0632  AST 41 '25 24 31  ' ALT '27 23 22 28  ' ALKPHOS 90 95 94 84  BILITOT 0.4 0.3 0.4 0.5  PROT 6.4* 6.1* 6.3* 6.0*  ALBUMIN 1.7* 1.6* 1.7* 1.7*   CBC: Recent Labs  Lab 02/11/18 0311 02/12/18 0453 02/13/18 0443 02/14/18 0632 02/15/18 0538  WBC 13.9* 15.7* 18.9* 13.9* 13.8*  NEUTROABS 10.3*  --   --   --  11.6*  HGB 6.4* 6.9* 8.1* 8.7* 8.3*  HCT 20.3* 22.3* 25.8* 27.6* 26.6*  MCV 85.1 86.7 85.6 86.9 86.8  PLT 359 310 352 307 329   BNP (last 3 results) Recent  Labs    01/06/18 1944 01/16/18 2332 02/12/18 0453  BNP 37.0 402.0* 255.0*     CBG: Recent Labs  Lab 02/14/18 1924 02/14/18 2310 02/15/18 0321 02/15/18 0723 02/15/18 1223  GLUCAP 185* 166* 186* 178* 174*    Recent Results (from the past 240 hour(s))  Culture, respiratory (NON-Expectorated)     Status: None   Collection Time: 02/10/18 12:02 PM  Result Value Ref Range Status   Specimen Description   Final    TRACHEAL ASPIRATE Performed at [facility], [address], [place], [place] [address]    Special Requests   Final    NONE Performed at [facility], [address] [place], [place] [address]    Gram Stain   Final    MODERATE WBC PRESENT,BOTH PMN AND MONONUCLEAR NO ORGANISMS SEEN    Culture   Final    Consistent with normal respiratory flora. Performed at [facility], [place] [address], [place], [place] [address]    Report Status  02/12/2018 FINAL  Final  Culture, respiratory (NON-Expectorated)     Status: None (Preliminary result)   Collection Time: 02/13/18  1:42 PM  Result Value Ref Range Status   Specimen Description   Final    TRACHEAL ASPIRATE Performed at [facility], [address], [place], [place] [address]    Special Requests   Final    NONE Performed at [facility], [address] [place], [place] [address]    Gram Stain   Final    ABUNDANT WBC PRESENT,BOTH PMN AND MONONUCLEAR RARE YEAST RARE GRAM VARIABLE ROD    Culture   Final    FEW KLEBSIELLA PNEUMONIAE FEW GRAM NEGATIVE RODS SUSCEPTIBILITIES TO FOLLOW FOR ORGANISM 1 IDENTIFICATION AND SUSCEPTIBILITIES TO FOLLOW FOR ORGANISM 2 Performed at [facility], [place] [address] [place], [place] [address]    Report Status PENDING  Incomplete     Studies: No results found.  Scheduled Meds: . insulin aspart      . budesonide (PULMICORT) nebulizer solution  0.25 mg Nebulization Q6H  . chlorhexidine gluconate (MEDLINE KIT)  15 mL Mouth Rinse BID  . clonazePAM  2 mg Per Tube BID  . digoxin  0.25 mg Per Tube Daily  . docusate  100 mg Per Tube BID  . enoxaparin (LOVENOX) injection  40 mg Subcutaneous Daily  . fentaNYL  100 mcg Transdermal Q72H  . free water  200 mL Per Tube Q8H  . gabapentin  200 mg Per Tube Q8H  . hydrocortisone sod succinate (SOLU-CORTEF) inj  25 mg Intravenous Q12H  . insulin aspart  0-20 Units Subcutaneous Q4H  . insulin glargine  20 Units Subcutaneous QHS  . ipratropium-albuterol  3 mL Nebulization Q6H  . lisinopril  10 mg Per Tube Daily  . mouth rinse  15 mL Mouth Rinse 10 times per day  . metoprolol tartrate  12.5 mg Per Tube Q6H  . nutrition supplement (JUVEN)  1 packet Per Tube BID  . nystatin  5 mL Oral QID  . pantoprazole sodium  40 mg Per Tube Daily  . QUEtiapine  100 mg Oral BID  . sennosides  5 mL Per Tube QHS  . valproic acid  1,000 mg Per Tube BID   Continuous Infusions: .  dexmedetomidine (PRECEDEX) IV infusion 2 mcg/kg/hr (02/15/18 0600)  . feeding supplement (GLUCERNA 1.5 CAL) 60 mL/hr at 02/15/18 0600    Assessment/Plan:  1. Acute hypoxic respiratory failure concerning for ARDS.  Status post tracheostomy    patient made a DNR.  On Solu-Cortef taper.  Patient on sedation with Precedex    2. Ventilator associated pneumonia with sepsis with Klebsiella.  Finished antibiotics. 3. Atrial fibrillation and episodes of SVT.  Heart rate improved on digoxin and metoprolol.  Amiodarone discontinued secondary to worsening respiratory status. 4. Acute on chronic systolic congestive heart failure with anasarca.   On lisinopril and metoprolol. 5. Acute encephalopathy with history of traumatic brain injury. 6. Recent GI bleed on PPI.  Endoscopy negative.  Continue to watch hemoglobin closely.  Received a unit of blood yesterday.  Today's hemoglobin 8.3. 7. Acute kidney injury.  Has resolved 8. Type 2 diabetes mellitus on glargine insulin and sliding scale 9. Nutrition.  On tube feeds as per intensivist 10. Depression on Depakene, Seroquel 11. Stage II decubitus on buttock-continue wound care  Code Status:     Code Status Orders  (From admission, onward)        Start     Ordered   01/06/18 2227  Full code  Continuous     01/06/18 2226    Code Status History    This patient has a current code status but no historical code status.     Family Communication: As per critical care specialist  disposition Plan: To be determined  Consultants:  Critical care specialist  Infectious disease  Time spent: 23 minutes.  Nicholes Mango  [employer]

## 2018-02-15 NOTE — Progress Notes (Signed)
PULMONARY / CRITICAL CARE MEDICINE   Name: Tommy Mathews MRN: [id number] DOB: [birth year]    ADMISSION DATE:  01/06/2018   PT PROFILE:  62 M SNF resident due to psychiatric illness and history of TBI admitted with acute hypoxemic respiratory failure, severe bilateral pulmonary infiltrates, severe sepsis/septic shock.  Intubated in the ED shortly after arrival.  Was also noted to have dark coffee-ground material suctioned from stomach.  Patient chronically on rivaroxaban.   MAJOR EVENTS/TEST RESULTS: 04/08 Admission as above 04/08 CT chest: Multifocal severe pneumonia with RUL collapse and soft tissue obstructing RUL bronchus.  04/08 CTAP: Mild small bowel ileus, potential enteritis. Nasogastric tube terminates in distal stomach. Cholelithiasis without CT findings of acute cholecystitis.Consider bronchoscopy.  04/09 nephrology consultation: No acute indication for dialysis at present. 04/09 gastroenterology consultation: Recommend high-dose PPI therapy. 04/10 severely hypoxemic despite 100% FiO2.  Recruitment maneuver with significant improvement.  PEEP increased and ARDS strategy on ventilator implemented.  Neuromuscular blockade implemented due to ventilator dyssynchrony. 04/11 PAF with RVR > converted with amiodarone. Gas exchange improving. Off Nimbex 04/12 Echocardiogram: EF 20-25%.  Diffuse HK. 04/13 High airway pressure with vent dyssynchony; given Vecuronium; FIO2 increased to 50%  04/16 Cardiology consultation 04/13 Amiodarone D/C because of SB 04/15 increased HR, EF 20% afib with RVR 04/17 failed weaning trials, HR remains elevated 04/19 ENT consultation for trach tube placement 04/20 no significant changes in the last 24 hours. Has not tolerated any weaning  04/23 PEG placed. 04/23 intermittent ventilator dyssynchrony - sedation regimen adjusted 04/24 Somewhat more responsive. Not F/C 04/25 Trach tube placed 04/25 significantly more responsive 04/30 and hypoxemia and pulmonary  infiltrates 05/02 fever, leukocytosis 05/03 severe ARDS pattern on CXR 05/03 ID consultation 05/14 goals of care discussion with patient's sister and mother.  Agree to continue aggressive support but DNR in event of cardiac arrest. Received one unit RBCs for Hgb 6.4. For severe ARDS, NMB protocol initiated 05/15 Gas exchange much improved. Remains on NMB. Received one unit RBCs for Hgb 6.9    INDWELLING DEVICES: L femoral CVL 04/08 >> 4/16 ETT 04/08 >> 04/25 RUE PICC 4/15 >>  PEG 04/23 >>  Trach tube 04/25 >>   MICRO DATA: MRSA PCR 04/08, 04/20 >> POS, 05/08 >> NEG Urine 04/08 >> 10k coag negative staph Resp 04/09 >> c/w NOF Blood 04/08 >> NEG Blood 04/09 >> NEG Resp 04/20 >> NOF Urine 05/03 >> NEG  Resp 05/03 >> NOF Blood 05/03 >> 2/2 Klebsiella Blood 05/07 >> NEG Resp 05/13 >> NOF   ANTIMICROBIALS:  Unasyn 04/08 >> 04/10 Vanc 04/08 >> 4/15, 05/03 >> 05/10 Cefepime 04/10 >> 4/15 Metronidazole 04/10 >> 4/15 Meropenem 05/03 >> 05/10 Cefazolin 05/11 >> 05/14 Ceftriaxone 05/14 >>    SUBJECTIVE:  Patient without significant change. Still requiring high PEEP and FiO2 requirements. Patient is responsive difficult to communicate with on Precedex and mechanical ventilation  VITAL SIGNS: BP (!) 147/77   Pulse 67   Temp 98 F (36.7 C) (Oral)   Resp (!) 23   Ht  (1.778 m)   Wt 258 lb 13.1 oz (117.4 kg)   SpO2 92%   BMI 37.14 kg/m    VENTILATOR SETTINGS: Vent Mode: PCV FiO2 (%):  [60 %] 60 % Set Rate:  [18 bmp] 18 bmp PEEP:  [12 cmH20] 12 cmH20 Plateau Pressure:  [12 cmH20] 12 cmH20  INTAKE / OUTPUT: I/O last 3 completed shifts: In: 4000.8 [I.V.:1560.2; Other:30; NG/GT:2410.6] Out: 3500 [Urine:3500]  PHYSICAL EXAMINATION: General: RASS N/A  on NMB Neuro: PERRL HEENT: NCAT, sclerae white Cardiovascular: Regular, no M Lungs: No wheezes, few crackles dependently Abdomen: Nontender, + BS GU: minimal scrotal edema Extremities: Symmetric BLE  edema  LABS:  BMET Recent Labs  Lab 02/13/18 0443 02/14/18 0632 02/15/18 0538  NA 139 143 146*  K 4.3 3.7 3.3*  CL 94* 103 109  CO2 37* 33* 31  BUN 39* 40* 38*  CREATININE 0.76 0.56* 0.59*  GLUCOSE 165* 184* 169*    Electrolytes Recent Labs  Lab 02/10/18 0509 02/11/18 0311  02/13/18 0443 02/14/18 0632 02/15/18 0538  CALCIUM 8.4* 8.6*   < > 8.5* 8.1* 7.5*  MG 2.0 1.8  --   --  1.7 1.6*  PHOS 3.1 2.2*  --  4.1  --   --    < > = values in this interval not displayed.    CBC Recent Labs  Lab 02/13/18 0443 02/14/18 0632 02/15/18 0538  WBC 18.9* 13.9* 13.8*  HGB 8.1* 8.7* 8.3*  HCT 25.8* 27.6* 26.6*  PLT 352 307 329    Coag's No results for input(s): APTT, INR in the last 168 hours.  Sepsis Markers No results for input(s): LATICACIDVEN, PROCALCITON, O2SATVEN in the last 168 hours.  ABG Recent Labs  Lab 02/09/18 1612 02/10/18 0418 02/11/18 0500  PHART 7.35 7.49* 7.51*  PCO2ART 92* 67* 63*  PO2ART 52* 77* 66*    Liver Enzymes Recent Labs  Lab 02/12/18 0453 02/13/18 0443 02/14/18 0632  AST ALT ALKPHOS 95 94 84  BILITOT 0.3 0.4 0.5  ALBUMIN 1.6* 1.7* 1.7*    Cardiac Enzymes No results for input(s): TROPONINI, PROBNP in the last 168 hours.  Glucose Recent Labs  Lab 02/14/18 1123 02/14/18 1608 02/14/18 1924 02/14/18 2310 02/15/18 0321 02/15/18 0723  GLUCAP 250* 179* 185* 166* 186* 178*    CXR: ARDS pattern    ASSESSMENT / PLAN: PULMONARY A: Acute hypoxemic respiratory failure Aspiration pneumonia ARDS, recurrent Pulmonary edema Prolonged mechanical ventilation Tracheostomy tube status P:   Cont full vent support - Lung protection strategy Cont vent bundle Daily SBT if/when meets criteria  Continue nebulized bronchodilators Cont empiric methylprednisolone for possible amiodarone induced lung injury  CARDIOVASCULAR A: septic shock, resolved Paroxysmal atrial fibrillation - NSR presently Dilated  cardiomyopathy, etiology unclear Hypertension Intermittent sinus tachycardia P:  MAP goal > 65 mmHg Discontinue amiodarone as it might be contributing to lung injury Resume hydrochlorothiazide at reduced dose Continue scheduled metoprolol Resume lisinopril Continue hydralazine as needed to maintain SBP < 160 mmHg  RENAL A:   AKI, resolved Hypernatremia, resolved Hypervolemia/anasarca - improving Metabolic alkalosis due to loop diuresis, improving P: Monitor BMET intermittently Monitor I/Os Correct electrolytes as indicated  Continue free water per G-tube Furosemide X 1 dose 05/15  GASTROINTESTINAL A:   Abdominal distention, resolved Suspected UGIB, resolved G tube status Gastric ileus  P:   SUP: Enteral PPI Continue TF protocol - holding presently  HEMATOLOGIC A: ICU acquired anemia P: DVT px: enoxaparin Monitor CBC intermittently  Transfuse per usual guidelines   INFECTIOUS A:   Severe sepsis with shock, resolved Aspiration pneumonia, treated Klebsiella pneumonia with bacteremia P:   Monitor temp, WBC count Micro and abx as above   ENDOCRINE A:   Type 2 diabetes, adequately controlled P:   Continue Lantus - increased 05/14 with initiation of methylpred Continue resistant scale SSI  NEUROLOGIC A:   Ventilator dyssynchrony ICU/ventilator associated discomfort History of schizophrenia History of TBI  P:   Psychiatry consult to assist with severe anxiety agitation and delirium prohibiting weaning attempts on mechanical ventilation   Tora Kindred, DOPatient ID: Tommy Mathews, male   DOB: 1961-07-19, 57 y.o.   MRN: [id number]

## 2018-02-15 NOTE — Consult Note (Signed)
I was ask to manage psychotropic medication in a patient with a history of schizophrenia, now off the respirator, but still on Precedex due to agitation.  Chart reviewed. Unable to interview the patient.   He was on Seroquel 500 mg BID and Depakote 500 mg BID in the community. Here Seroquel 100 mg BID and Depakote 1000 mg BID. VPA level on 5/2 was 53  PLAN: 1. Increase Seroquel to 500 mg BID gradually 200 mg now, 300 mg tonight. 400 mg BID tomorrow.  2. Continue Depakote 1000 mg BID. I will recheck VPA and ammonia level tonight.  3. Will see patient tomorrow.

## 2018-02-16 LAB — BASIC METABOLIC PANEL
Anion gap: 6 (ref 5–15)
BUN: 34 mg/dL — AB (ref 6–20)
CHLORIDE: 105 mmol/L (ref 101–111)
CO2: 34 mmol/L — AB (ref 22–32)
CREATININE: 0.43 mg/dL — AB (ref 0.61–1.24)
Calcium: 7.7 mg/dL — ABNORMAL LOW (ref 8.9–10.3)
GFR calc Af Amer: >60 mL/min (ref >60)
GFR calc non Af Amer: >60 mL/min (ref >60)
GLUCOSE: 182 mg/dL — AB (ref 65–99)
Potassium: 3.5 mmol/L (ref 3.5–5.1)
Sodium: 145 mmol/L (ref 135–145)

## 2018-02-16 LAB — CULTURE, RESPIRATORY

## 2018-02-16 LAB — CBC WITH DIFFERENTIAL/PLATELET
Band Neutrophils: 1 %
Basophils Absolute: 0 10*3/uL (ref 0–0.1)
Basophils Relative: 0 %
Blasts: 0 %
Eosinophils Absolute: 0 10*3/uL (ref 0–0.7)
Eosinophils Relative: 0 %
HCT: 28.1 % — ABNORMAL LOW (ref 40.0–52.0)
Hemoglobin: 8.9 g/dL — ABNORMAL LOW (ref 13.0–18.0)
Lymphocytes Relative: 10 %
Lymphs Abs: 1.2 10*3/uL (ref 1.0–3.6)
MCH: 27.6 pg (ref 26.0–34.0)
MCHC: 31.6 g/dL — ABNORMAL LOW (ref 32.0–36.0)
MCV: 87.3 fL (ref 80.0–100.0)
Metamyelocytes Relative: 2 %
Monocytes Absolute: 0.7 10*3/uL (ref 0.2–1.0)
Monocytes Relative: 6 %
Myelocytes: 1 %
Neutro Abs: 10.5 10*3/uL — ABNORMAL HIGH (ref 1.4–6.5)
Neutrophils Relative %: 80 %
Other: 0 %
Platelets: 290 10*3/uL (ref 150–440)
Promyelocytes Relative: 0 %
RBC: 3.21 MIL/uL — ABNORMAL LOW (ref 4.40–5.90)
RDW: 19.2 % — ABNORMAL HIGH (ref 11.5–14.5)
WBC: 12.4 10*3/uL — ABNORMAL HIGH (ref 3.8–10.6)
nRBC: 5 /100 WBC — ABNORMAL HIGH

## 2018-02-16 LAB — COMPREHENSIVE METABOLIC PANEL
ALK PHOS: 69 U/L (ref 38–126)
ALT: 19 U/L (ref 17–63)
AST: 20 U/L (ref 15–41)
Albumin: 1.8 g/dL — ABNORMAL LOW (ref 3.5–5.0)
Anion gap: 6 (ref 5–15)
BUN: 37 mg/dL — ABNORMAL HIGH (ref 6–20)
CALCIUM: 7.9 mg/dL — AB (ref 8.9–10.3)
CO2: 34 mmol/L — ABNORMAL HIGH (ref 22–32)
CREATININE: 0.56 mg/dL — AB (ref 0.61–1.24)
Chloride: 104 mmol/L (ref 101–111)
GFR calc Af Amer: >60 mL/min (ref >60)
GFR calc non Af Amer: >60 mL/min (ref >60)
Glucose, Bld: 206 mg/dL — ABNORMAL HIGH (ref 65–99)
Potassium: 4 mmol/L (ref 3.5–5.1)
SODIUM: 144 mmol/L (ref 135–145)
Total Bilirubin: 0.4 mg/dL (ref 0.3–1.2)
Total Protein: 5.5 g/dL — ABNORMAL LOW (ref 6.5–8.1)

## 2018-02-16 LAB — GLUCOSE, CAPILLARY
Glucose-Capillary: 169 mg/dL — ABNORMAL HIGH (ref 65–99)
Glucose-Capillary: 180 mg/dL — ABNORMAL HIGH (ref 65–99)
Glucose-Capillary: 173 mg/dL — ABNORMAL HIGH (ref 65–99)
Glucose-Capillary: 175 mg/dL — ABNORMAL HIGH (ref 65–99)
Glucose-Capillary: 189 mg/dL — ABNORMAL HIGH (ref 65–99)
Glucose-Capillary: 184 mg/dL — ABNORMAL HIGH (ref 65–99)

## 2018-02-16 LAB — CULTURE, RESPIRATORY W GRAM STAIN

## 2018-02-16 MED ORDER — QUETIAPINE FUMARATE 200 MG PO TABS
500.0000 mg | ORAL_TABLET | Freq: Two times a day (BID) | ORAL | Status: DC
  Administered 2018-02-16 – 2018-02-24 (×16): 500 mg via ORAL
  Filled 2018-02-16 (×17): qty 1

## 2018-02-16 NOTE — Consult Note (Signed)
This is follow up on this gravely ill patient. I am again unable to interview the patient. Spoke with his nurse who reports improvement.  Yesterday we increased Seroquel from 100 mg BID to 400 mg BID. His regular dose is 500 mg BID. We continued Depakene 1000 mg BID. VPA level is low, ammonia level normal.  PLAN: 1. I will increase Seroquel to the target dose of 500 mg BID.  2. Continue Depakote.  3. Psychiatry will follow along.

## 2018-02-16 NOTE — Progress Notes (Addendum)
PULMONARY / CRITICAL CARE MEDICINE   Name: Tommy Mathews MRN: [id number] DOB: 11-Aug-1961    ADMISSION DATE:  01/06/2018   PT PROFILE:  87 M SNF resident due to psychiatric illness and history of TBI admitted with acute hypoxemic respiratory failure, severe bilateral pulmonary infiltrates, severe sepsis/septic shock.  Intubated in the ED shortly after arrival.  Was also noted to have dark coffee-ground material suctioned from stomach.  Patient chronically on rivaroxaban.   MAJOR EVENTS/TEST RESULTS: 04/08 Admission as above 04/08 CT chest: Multifocal severe pneumonia with RUL collapse and soft tissue obstructing RUL bronchus.  04/08 CTAP: Mild small bowel ileus, potential enteritis. Nasogastric tube terminates in distal stomach. Cholelithiasis without CT findings of acute cholecystitis.Consider bronchoscopy.  04/09 nephrology consultation: No acute indication for dialysis at present. 04/09 gastroenterology consultation: Recommend high-dose PPI therapy. 04/10 severely hypoxemic despite 100% FiO2.  Recruitment maneuver with significant improvement.  PEEP increased and ARDS strategy on ventilator implemented.  Neuromuscular blockade implemented due to ventilator dyssynchrony. 04/11 PAF with RVR > converted with amiodarone. Gas exchange improving. Off Nimbex 04/12 Echocardiogram: EF 20-25%.  Diffuse HK. 04/13 High airway pressure with vent dyssynchony; given Vecuronium; FIO2 increased to 50%  04/16 Cardiology consultation 04/13 Amiodarone D/C because of SB 04/15 increased HR, EF 20% afib with RVR 04/17 failed weaning trials, HR remains elevated 04/19 ENT consultation for trach tube placement 04/20 no significant changes in the last 24 hours. Has not tolerated any weaning  04/23 PEG placed. 04/23 intermittent ventilator dyssynchrony - sedation regimen adjusted 04/24 Somewhat more responsive. Not F/C 04/25 Trach tube placed 04/25 significantly more responsive 04/30 and hypoxemia and pulmonary  infiltrates 05/02 fever, leukocytosis 05/03 severe ARDS pattern on CXR 05/03 ID consultation 05/14 goals of care discussion with patient's sister and mother.  Agree to continue aggressive support but DNR in event of cardiac arrest. Received one unit RBCs for Hgb 6.4. For severe ARDS, NMB protocol initiated 05/15 Gas exchange much improved. Remains on NMB. Received one unit RBCs for Hgb 6.9    INDWELLING DEVICES: L femoral CVL 04/08 >> 4/16 ETT 04/08 >> 04/25 RUE PICC 4/15 >>  PEG 04/23 >>  Trach tube 04/25 >>   MICRO DATA: MRSA PCR 04/08, 04/20 >> POS, 05/08 >> NEG Urine 04/08 >> 10k coag negative staph Resp 04/09 >> c/w NOF Blood 04/08 >> NEG Blood 04/09 >> NEG Resp 04/20 >> NOF Urine 05/03 >> NEG  Resp 05/03 >> NOF Blood 05/03 >> 2/2 Klebsiella Blood 05/07 >> NEG Resp 05/13 >> NOF   ANTIMICROBIALS:  Unasyn 04/08 >> 04/10 Vanc 04/08 >> 4/15, 05/03 >> 05/10 Cefepime 04/10 >> 4/15 Metronidazole 04/10 >> 4/15 Meropenem 05/03 >> 05/10 Cefazolin 05/11 >> 05/14 Ceftriaxone 05/14 >>    SUBJECTIVE:  Patient without significant change. Still requiring high PEEP and FiO2 requirements. Greatly appreciate psychiatry consultation and assistance with management of agitation. Will follow recommendations  VITAL SIGNS: BP (!) 154/75   Pulse 60   Temp 98.3 F (36.8 C)   Resp (!) 3   Ht  (1.778 m)   Wt 259 lb 0.7 oz (117.5 kg)   SpO2 96%   BMI 37.17 kg/m    VENTILATOR SETTINGS: Vent Mode: PCV FiO2 (%):  [60 %] 60 % Set Rate:  [18 bmp] 18 bmp PEEP:  [12 cmH20] 12 cmH20 Plateau Pressure:  [25 cmH20-29 cmH20] 26 cmH20  INTAKE / OUTPUT: I/O last 3 completed shifts: In: 2509 [I.V.:1589; NG/GT:920] Out: 2650 [Urine:2650]  PHYSICAL EXAMINATION: General: RASS N/A  on NMB Neuro: PERRL HEENT: NCAT, sclerae white Cardiovascular: Regular, no M Lungs: No wheezes, few crackles dependently Abdomen: Nontender, + BS GU: minimal scrotal edema Extremities: Symmetric BLE  edema  LABS:  BMET Recent Labs  Lab 02/13/18 0443 02/14/18 0632 02/15/18 0538  NA 139 143 146*  K 4.3 3.7 3.3*  CL 94* 103 109  CO2 37* 33* 31  BUN 39* 40* 38*  CREATININE 0.76 0.56* 0.59*  GLUCOSE 165* 184* 169*    Electrolytes Recent Labs  Lab 02/10/18 0509 02/11/18 0311  02/13/18 0443 02/14/18 0632 02/15/18 0538  CALCIUM 8.4* 8.6*   < > 8.5* 8.1* 7.5*  MG 2.0 1.8  --   --  1.7 1.6*  PHOS 3.1 2.2*  --  4.1  --   --    < > = values in this interval not displayed.    CBC Recent Labs  Lab 02/13/18 0443 02/14/18 0632 02/15/18 0538  WBC 18.9* 13.9* 13.8*  HGB 8.1* 8.7* 8.3*  HCT 25.8* 27.6* 26.6*  PLT 352 307 329    Coag's No results for input(s): APTT, INR in the last 168 hours.  Sepsis Markers No results for input(s): LATICACIDVEN, PROCALCITON, O2SATVEN in the last 168 hours.  ABG Recent Labs  Lab 02/09/18 1612 02/10/18 0418 02/11/18 0500  PHART 7.35 7.49* 7.51*  PCO2ART 92* 67* 63*  PO2ART 52* 77* 66*    Liver Enzymes Recent Labs  Lab 02/12/18 0453 02/13/18 0443 02/14/18 0632  AST ALT ALKPHOS 95 94 84  BILITOT 0.3 0.4 0.5  ALBUMIN 1.6* 1.7* 1.7*    Cardiac Enzymes No results for input(s): TROPONINI, PROBNP in the last 168 hours.  Glucose Recent Labs  Lab 02/15/18 1223 02/15/18 1545 02/15/18 1918 02/15/18 2313 02/16/18 0309 02/16/18 0729  GLUCAP 174* 152* 144* 187* 169* 180*    CXR: ARDS pattern    ASSESSMENT / PLAN: PULMONARY A: Acute hypoxemic respiratory failure Aspiration pneumonia ARDS. Sputum culture on 5/16 showed a few isolates of Klebsiella, patient is afebrile, will repeat white count and chest x-ray if any changes and infiltrate will empirically cover  CARDIOVASCULAR A: septic shock, resolved Paroxysmal atrial fibrillation - NSR presently Dilated cardiomyopathy, etiology unclear Hypertension Intermittent sinus tachycardia P:  MAP goal > 65 mmHg Discontinue amiodarone as it might  be contributing to lung injury Resume hydrochlorothiazide at reduced dose Continue scheduled metoprolol Resume lisinopril Continue hydralazine as needed to maintain SBP < 160 mmHg  RENAL A:   AKI, resolved Hypernatremia, resolved Hypervolemia/anasarca - improving Metabolic alkalosis due to loop diuresis, improving P: Monitor BMET intermittently Monitor I/Os Correct electrolytes as indicated  Continue free water per G-tube Furosemide X 1 dose 05/15  GASTROINTESTINAL A:   Abdominal distention, resolved Suspected UGIB, resolved G tube status Gastric ileus  P:   SUP: Enteral PPI Continue TF protocol - holding presently  HEMATOLOGIC A: ICU acquired anemia P: DVT px: enoxaparin Monitor CBC intermittently  Transfuse per usual guidelines   INFECTIOUS A:   Severe sepsis with shock, resolved Aspiration pneumonia, treated Klebsiella pneumonia with bacteremia P:   Monitor temp, WBC count Micro and abx as above   ENDOCRINE A:   Type 2 diabetes, adequately controlled P:   Continue Lantus - increased 05/14 with initiation of methylpred Continue resistant scale SSI  NEUROLOGIC A:   Ventilator dyssynchrony ICU/ventilator associated discomfort History of schizophrenia History of TBI P:   Psychiatry consult to assist with severe anxiety agitation and delirium  prohibiting weaning attempts on mechanical ventilation   Tora Kindred, DOPatient ID: Tommy Mathews, male   DOB: 1961/04/27, 57 y.o.   MRN: [id number] Patient ID: Tommy Mathews, male   DOB: 12/27/1960, 57 y.o.   MRN: [id number]

## 2018-02-16 NOTE — Progress Notes (Signed)
Patient ID: Tommy Mathews, male   DOB: Jun 29, 1961, 57 y.o.   MRN: [id number]  [facility] PROGRESS NOTE  Kaylub Detienne VEH:[id number] DOB: Feb 23, 1961 DOA: 01/06/2018 PCP: Isaias Cowman, MD  HPI/Subjective: Patient down to 60% FiO2,.  PEEP 12, still lethargic getting tube feeds Glucerna  Objective: Vitals:   02/16/18 2100 02/16/18 2111  BP: (!) 167/82   Pulse: (!) 59 (!) 57  Resp: (!) 34 (!) 35  Temp:  98.4 F (36.9 C)  SpO2: 95% 96%    Filed Weights   02/14/18 0502 02/15/18 0334 02/16/18 0340  Weight: 116 kg (255 lb 11.7 oz) 117.4 kg (258 lb 13.1 oz) 117.5 kg (259 lb 0.7 oz)    ROS: Review of Systems  Unable to perform ROS: Acuity of condition   Exam: Physical Exam  Constitutional: He appears lethargic.  HENT:  Nose: No mucosal edema.  Mouth/Throat: No oropharyngeal exudate or posterior oropharyngeal edema.  Tracheostomy site intact  Eyes: Pupils are equal, round, and reactive to light. Conjunctivae and lids are normal.  Neck: No JVD present. Carotid bruit is not present. No edema present. No thyroid mass and no thyromegaly present.  Cardiovascular: S1 normal and S2 normal. Exam reveals no gallop.  No murmur heard. Respiratory: No respiratory distress. He has decreased breath sounds in the right middle field, the right lower field, the left middle field and the left lower field. He has no wheezes. He has rhonchi in the right lower field and the left lower field. He has no rales.  GI: Soft. Bowel sounds are normal. He exhibits distension. There is no tenderness.  Musculoskeletal:       Right wrist: He exhibits swelling.       Left wrist: He exhibits swelling.       Right ankle: He exhibits swelling.       Left ankle: He exhibits swelling.  Lymphadenopathy:    He has no cervical adenopathy.  Neurological: He appears lethargic.  Sedated  Skin: Skin is warm. Nails show no clubbing.  As per nursing staff stage II decubitus on buttock  Psychiatric:  Sedated       Data Reviewed: Basic Metabolic Panel: Recent Labs  Lab 02/10/18 0509 02/11/18 0311 02/12/18 0453 02/13/18 0443 02/14/18 0632 02/15/18 0538 02/16/18 0805  NA 138 140 141 139 143 146* 145  K 4.2 3.6 4.5 4.3 3.7 3.3* 3.5  CL 89* 91* 97* 94* 103 109 105  CO2 43* 42* 37* 37* 33* 31 34*  GLUCOSE 191* 150* 143* 165* 184* 169* 182*  BUN 35* 34* 38* 39* 40* 38* 34*  CREATININE 0.75 0.76 0.73 0.76 0.56* 0.59* 0.43*  CALCIUM 8.4* 8.6* 7.8* 8.5* 8.1* 7.5* 7.7*  MG 2.0 1.8  --   --  1.7 1.6*  --   PHOS 3.1 2.2*  --  4.1  --   --   --    Liver Function Tests: Recent Labs  Lab 02/11/18 0311 02/12/18 0453 02/13/18 0443 02/14/18 0632  AST 41 '25 24 31  ' ALT '27 23 22 28  ' ALKPHOS 90 95 94 84  BILITOT 0.4 0.3 0.4 0.5  PROT 6.4* 6.1* 6.3* 6.0*  ALBUMIN 1.7* 1.6* 1.7* 1.7*   CBC: Recent Labs  Lab 02/11/18 0311 02/12/18 0453 02/13/18 0443 02/14/18 0632 02/15/18 0538 02/16/18 0805  WBC 13.9* 15.7* 18.9* 13.9* 13.8* 12.4*  NEUTROABS 10.3*  --   --   --  11.6* 10.5*  HGB 6.4* 6.9* 8.1* 8.7* 8.3* 8.9*  HCT 20.3* 22.3*  25.8* 27.6* 26.6* 28.1*  MCV 85.1 86.7 85.6 86.9 86.8 87.3  PLT 359 310 352 307 329 290   BNP (last 3 results) Recent Labs    01/06/18 1944 01/16/18 2332 02/12/18 0453  BNP 37.0 402.0* 255.0*     CBG: Recent Labs  Lab 02/16/18 0309 02/16/18 0729 02/16/18 1139 02/16/18 1600 02/16/18 1925  GLUCAP 169* 180* 173* 175* 189*    Recent Results (from the past 240 hour(s))  Culture, respiratory (NON-Expectorated)     Status: None   Collection Time: 02/10/18 12:02 PM  Result Value Ref Range Status   Specimen Description   Final    TRACHEAL ASPIRATE Performed at [facility], [address], [place], [place] [address]    Special Requests   Final    NONE Performed at [facility], [address] [place], [place] [address]    Gram Stain   Final    MODERATE WBC PRESENT,BOTH PMN AND MONONUCLEAR NO ORGANISMS SEEN    Culture    Final    Consistent with normal respiratory flora. Performed at [facility], [place] [address] [place], [place] [address]    Report Status 02/12/2018 FINAL  Final  Culture, respiratory (NON-Expectorated)     Status: None   Collection Time: 02/13/18  1:42 PM  Result Value Ref Range Status   Specimen Description   Final    TRACHEAL ASPIRATE Performed at [facility], [address] [name], [place] [address]    Special Requests   Final    NONE Performed at [facility], [address] [place], [place] [address]    Gram Stain   Final    ABUNDANT WBC PRESENT,BOTH PMN AND MONONUCLEAR RARE YEAST RARE GRAM VARIABLE ROD Performed at [facility], [place] [address] [place], [place] [address]    Culture FEW KLEBSIELLA PNEUMONIAE  Final   Report Status 02/16/2018 FINAL  Final   Organism ID, Bacteria KLEBSIELLA PNEUMONIAE  Final      Susceptibility   Klebsiella pneumoniae - MIC*    AMPICILLIN >=32 RESISTANT Resistant     CEFAZOLIN <=4 SENSITIVE Sensitive     CEFEPIME <=1 SENSITIVE Sensitive     CEFTAZIDIME <=1 SENSITIVE Sensitive     CEFTRIAXONE <=1 SENSITIVE Sensitive     CIPROFLOXACIN <=0.25 SENSITIVE Sensitive     GENTAMICIN <=1 SENSITIVE Sensitive     IMIPENEM <=0.25 SENSITIVE Sensitive     TRIMETH/SULFA <=20 SENSITIVE Sensitive     AMPICILLIN/SULBACTAM 16 INTERMEDIATE Intermediate     PIP/TAZO 16 SENSITIVE Sensitive     Extended ESBL NEGATIVE Sensitive     * FEW KLEBSIELLA PNEUMONIAE     Studies: No results found.  Scheduled Meds: . budesonide (PULMICORT) nebulizer solution  0.25 mg Nebulization Q6H  . chlorhexidine gluconate (MEDLINE KIT)  15 mL Mouth Rinse BID  . clonazePAM  2 mg Per Tube BID  . digoxin  0.25 mg Per Tube Daily  . docusate  100 mg Per Tube BID  . enoxaparin (LOVENOX) injection  40 mg Subcutaneous Daily  . fentaNYL  100 mcg Transdermal Q72H  . free water  200 mL Per Tube Q8H  . gabapentin  200 mg Per Tube Q8H  .  hydrocortisone sod succinate (SOLU-CORTEF) inj  25 mg Intravenous Q12H  . insulin aspart  0-20 Units Subcutaneous Q4H  . insulin glargine  20 Units Subcutaneous QHS  . ipratropium-albuterol  3 mL Nebulization Q6H  . lisinopril  10 mg Per Tube  Daily  . mouth rinse  15 mL Mouth Rinse 10 times per day  . metoprolol tartrate  12.5 mg Per Tube Q6H  . nutrition supplement (JUVEN)  1 packet Per Tube BID  . nystatin  5 mL Oral QID  . pantoprazole sodium  40 mg Per Tube Daily  . QUEtiapine  500 mg Oral BID  . sennosides  5 mL Per Tube QHS  . valproic acid  1,000 mg Per Tube BID   Continuous Infusions: . dexmedetomidine (PRECEDEX) IV infusion 1.8 mcg/kg/hr (02/16/18 2108)  . feeding supplement (GLUCERNA 1.5 CAL) 60 mL/hr at 02/16/18 1800    Assessment/Plan:  1. Acute hypoxic respiratory failure concerning for ARDS.  Status post tracheostomy    patient made a DNR.  On Solu-Cortef taper.  Patient getting fentanyl /Versed as needed basis 2. Ventilator associated pneumonia with sepsis with Klebsiella.  Finished antibiotics. 3. Atrial fibrillation and episodes of SVT.  Currently sinus rhythm heart rate improved on digoxin and metoprolol.  Amiodarone discontinued secondary to worsening respiratory status. 4. Acute on chronic systolic congestive heart failure with anasarca.   On lisinopril and metoprolol. 5. Acute encephalopathy with history of traumatic brain injury. 6. Recent GI bleed on PPI.  Endoscopy negative.  Continue to watch hemoglobin closely.  Received a unit of blood yesterday.  hemoglobin is stable 7. Acute kidney injury.  Has resolved 8. Type 2 diabetes mellitus on glargine insulin and sliding scale 9. Nutrition.  On tube feeds as per intensivist 10. History of traumatic brain injury, depression with severe anxiety-seen by psychiatrist plan is to continue Depakene, Seroquel dose increased to his home dose 500 mg  twice daily 11. Stage II decubitus on buttock-continue wound care  Code  Status:     Code Status Orders  (From admission, onward)        Start     Ordered   01/06/18 2227  Full code  Continuous     01/06/18 2226    Code Status History    This patient has a current code status but no historical code status.     Family Communication: As per critical care specialist  disposition Plan: To be determined  Consultants:  Critical care specialist  Infectious disease  Time spent: 23 minutes.  Nicholes Mango  [employer]

## 2018-02-16 NOTE — Progress Notes (Signed)
Lavaged patient with saline prior to tracheal suctioning, got back mod amt thick tan/yellow secretions.

## 2018-02-17 DIAGNOSIS — J189 Pneumonia, unspecified organism: Secondary | ICD-10-CM

## 2018-02-17 DIAGNOSIS — J9601 Acute respiratory failure with hypoxia: Secondary | ICD-10-CM

## 2018-02-17 LAB — CBC
HCT: 25 % — ABNORMAL LOW (ref 40.0–52.0)
Hemoglobin: 8.1 g/dL — ABNORMAL LOW (ref 13.0–18.0)
MCH: 28.5 pg (ref 26.0–34.0)
MCHC: 32.4 g/dL (ref 32.0–36.0)
MCV: 88 fL (ref 80.0–100.0)
Platelets: 241 10*3/uL (ref 150–440)
RBC: 2.84 MIL/uL — ABNORMAL LOW (ref 4.40–5.90)
RDW: 19.2 % — AB (ref 11.5–14.5)
WBC: 9.9 10*3/uL (ref 3.8–10.6)

## 2018-02-17 LAB — GLUCOSE, CAPILLARY
GLUCOSE-CAPILLARY: 196 mg/dL — AB (ref 65–99)
Glucose-Capillary: 103 mg/dL — ABNORMAL HIGH (ref 65–99)
Glucose-Capillary: 147 mg/dL — ABNORMAL HIGH (ref 65–99)
Glucose-Capillary: 153 mg/dL — ABNORMAL HIGH (ref 65–99)
Glucose-Capillary: 169 mg/dL — ABNORMAL HIGH (ref 65–99)
Glucose-Capillary: 171 mg/dL — ABNORMAL HIGH (ref 65–99)

## 2018-02-17 LAB — MAGNESIUM: Magnesium: 1.4 mg/dL — ABNORMAL LOW (ref 1.7–2.4)

## 2018-02-17 LAB — PHOSPHORUS: PHOSPHORUS: 2.7 mg/dL (ref 2.5–4.6)

## 2018-02-17 MED ORDER — FUROSEMIDE 10 MG/ML IJ SOLN
60.0000 mg | Freq: Once | INTRAMUSCULAR | Status: AC
  Administered 2018-02-17: 60 mg via INTRAVENOUS
  Filled 2018-02-17: qty 6

## 2018-02-17 MED ORDER — ALPRAZOLAM 1 MG PO TABS
1.0000 mg | ORAL_TABLET | Freq: Three times a day (TID) | ORAL | Status: DC
  Administered 2018-02-17 – 2018-02-19 (×6): 1 mg via ORAL
  Filled 2018-02-17 (×6): qty 1

## 2018-02-17 MED ORDER — MAGNESIUM OXIDE 400 (241.3 MG) MG PO TABS
400.0000 mg | ORAL_TABLET | Freq: Two times a day (BID) | ORAL | Status: DC
  Administered 2018-02-17 – 2018-03-19 (×61): 400 mg
  Filled 2018-02-17 (×61): qty 1

## 2018-02-17 MED ORDER — IPRATROPIUM-ALBUTEROL 0.5-2.5 (3) MG/3ML IN SOLN
3.0000 mL | RESPIRATORY_TRACT | Status: DC
Start: 2018-02-17 — End: 2018-03-09
  Administered 2018-02-17 – 2018-03-09 (×117): 3 mL via RESPIRATORY_TRACT
  Filled 2018-02-17 (×116): qty 3

## 2018-02-17 MED ORDER — BUDESONIDE 0.5 MG/2ML IN SUSP
0.5000 mg | Freq: Two times a day (BID) | RESPIRATORY_TRACT | Status: DC
  Administered 2018-02-17 – 2018-03-19 (×60): 0.5 mg via RESPIRATORY_TRACT
  Filled 2018-02-17 (×61): qty 2

## 2018-02-17 MED ORDER — POTASSIUM CHLORIDE 20 MEQ PO PACK
40.0000 meq | PACK | Freq: Once | ORAL | Status: AC
  Administered 2018-02-17: 40 meq
  Filled 2018-02-17: qty 2

## 2018-02-17 MED ORDER — MAGNESIUM SULFATE 4 GM/100ML IV SOLN
4.0000 g | Freq: Once | INTRAVENOUS | Status: AC
  Administered 2018-02-17: 4 g via INTRAVENOUS
  Filled 2018-02-17: qty 100

## 2018-02-17 MED ORDER — METHYLPREDNISOLONE SODIUM SUCC 40 MG IJ SOLR
20.0000 mg | Freq: Two times a day (BID) | INTRAMUSCULAR | Status: DC
Start: 2018-02-17 — End: 2018-02-19
  Administered 2018-02-17 – 2018-02-18 (×4): 20 mg via INTRAVENOUS
  Filled 2018-02-17 (×5): qty 1

## 2018-02-17 NOTE — Progress Notes (Signed)
PULMONARY / CRITICAL CARE MEDICINE   Name: Tommy Mathews MRN: [id number] DOB: 10-14-1960    ADMISSION DATE:  01/06/2018    PT PROFILE:  57 M SNF resident due to psychiatric illness and history of TBI admitted with acute hypoxemic respiratory failure, severe bilateral pulmonary infiltrates, severe sepsis/septic shock.  Intubated in the ED shortly after arrival.  Was also noted to have dark coffee-ground material suctioned from stomach.  Patient chronically on rivaroxaban.   MAJOR EVENTS/TEST RESULTS: 04/08 Admission as above 04/08 CT chest: Multifocal severe pneumonia with RUL collapse and soft tissue obstructing RUL bronchus.  04/08 CTAP: Mild small bowel ileus, potential enteritis. Nasogastric tube terminates in distal stomach. Cholelithiasis without CT findings of acute cholecystitis.Consider bronchoscopy.  04/09 nephrology consultation: No acute indication for dialysis at present. 04/09 gastroenterology consultation: Recommend high-dose PPI therapy. 04/10 severely hypoxemic despite 100% FiO2.  Recruitment maneuver with significant improvement.  PEEP increased and ARDS strategy on ventilator implemented.  Neuromuscular blockade implemented due to ventilator dyssynchrony. 04/11 PAF with RVR > converted with amiodarone. Gas exchange improving. Off Nimbex 04/12 Echocardiogram: EF 20-25%.  Diffuse HK. 04/13 High airway pressure with vent dyssynchony; given Vecuronium; FIO2 increased to 50%  04/16 Cardiology consultation 04/13 Amiodarone D/C because of SB 04/15 increased HR, EF 20% afib with RVR 04/17 failed weaning trials, HR remains elevated 04/19 ENT consultation for trach tube placement 04/20 no significant changes in the last 24 hours. Has not tolerated any weaning  04/23 PEG placed. 04/23 intermittent ventilator dyssynchrony - sedation regimen adjusted 04/24 Somewhat more responsive. Not F/C 04/25 Trach tube placed 04/25 significantly more responsive 04/30 and hypoxemia and pulmonary  infiltrates 05/02 fever, leukocytosis 05/03 severe ARDS pattern on CXR 05/03 ID consultation 05/14 goals of care discussion with patient's sister and mother.  Agree to continue aggressive support but DNR in event of cardiac arrest. Received one unit RBCs for Hgb 6.4. For severe ARDS, NMB protocol initiated 05/15 Gas exchange much improved. Remains on NMB. Received one unit RBCs for Hgb 6.9 05/20 high fio2 requirements at 60%, PEEP at 12-plan for diuresis  CC follow up resp failure  SUBJECTIVE Remains on high vent support Remains critically ill +fluid balance 27L Needs vent to survive at this time  INDWELLING DEVICES: L femoral CVL 04/08 >> 4/16 ETT 04/08 >> 04/25 RUE PICC 4/15 >>  PEG 04/23 >>  Trach tube 04/25 >>   MICRO DATA: MRSA PCR 04/08, 04/20 >> POS, 05/08 >> NEG Urine 04/08 >> 10k coag negative staph Resp 04/09 >> c/w NOF Blood 04/08 >> NEG Blood 04/09 >> NEG Resp 04/20 >> NOF Urine 05/03 >> NEG  Resp 05/03 >> NOF Blood 05/03 >> 2/2 Klebsiella Blood 05/07 >> NEG Resp 05/13 >> NOF   ANTIMICROBIALS:  Unasyn 04/08 >> 04/10 Vanc 04/08 >> 4/15, 05/03 >> 05/10 Cefepime 04/10 >> 4/15 Metronidazole 04/10 >> 4/15 Meropenem 05/03 >> 05/10 Cefazolin 05/11 >> 05/14 Ceftriaxone 05/14 >> 05/15     VITAL SIGNS: BP (!) 142/77   Pulse 73   Temp 99.3 F (37.4 C) (Oral)   Resp (!) 29   Ht  (1.778 m)   Wt 261 lb 0.4 oz (118.4 kg)   SpO2 97%   BMI 37.45 kg/m    VENTILATOR SETTINGS: Vent Mode: PCV FiO2 (%):  [60 %] 60 % Set Rate:  [18 bmp] 18 bmp PEEP:  [12 cmH20] 12 cmH20 Pressure Support:  [12 cmH20] 12 cmH20 Plateau Pressure:  [26 cmH20-38 cmH20] 26 cmH20  INTAKE /  OUTPUT: I/O last 3 completed shifts: In: 5317.2 [I.V.:2031.2; NG/GT:3286] Out: 2175 [Urine:2175]  PHYSICAL EXAMINATION: General: RASS N/A on NMB Neuro: PERRL HEENT: NCAT, sclerae white Cardiovascular: Regular, no M Lungs: No wheezes, few crackles dependently Abdomen: Nontender, +  BS GU: minimal scrotal edema Extremities: Symmetric BLE edema  LABS:  BMET Recent Labs  Lab 02/15/18 0538 02/16/18 0805 02/16/18 2316  NA 146* 145 144  K 3.3* 3.5 4.0  CL 109 105 104  CO2 31 34* 34*  BUN 38* 34* 37*  CREATININE 0.59* 0.43* 0.56*  GLUCOSE 169* 182* 206*    Electrolytes Recent Labs  Lab 02/11/18 0311  02/13/18 0443 02/14/18 1610 02/15/18 0538 02/16/18 0805 02/16/18 2316 02/17/18 0513  CALCIUM 8.6*   < > 8.5* 8.1* 7.5* 7.7* 7.9*  --   MG 1.8  --   --  1.7 1.6*  --   --  1.4*  PHOS 2.2*  --  4.1  --   --   --   --  2.7   < > = values in this interval not displayed.    CBC Recent Labs  Lab 02/15/18 0538 02/16/18 0805 02/17/18 0513  WBC 13.8* 12.4* 9.9  HGB 8.3* 8.9* 8.1*  HCT 26.6* 28.1* 25.0*  PLT 329 290 241    Coag's No results for input(s): APTT, INR in the last 168 hours.  Sepsis Markers No results for input(s): LATICACIDVEN, PROCALCITON, O2SATVEN in the last 168 hours.  ABG Recent Labs  Lab 02/11/18 0500  PHART 7.51*  PCO2ART 63*  PO2ART 66*    Liver Enzymes Recent Labs  Lab 02/13/18 0443 02/14/18 0632 02/16/18 2316  AST ALT ALKPHOS 94 84 69  BILITOT 0.4 0.5 0.4  ALBUMIN 1.7* 1.7* 1.8*    Cardiac Enzymes No results for input(s): TROPONINI, PROBNP in the last 168 hours.  Glucose Recent Labs  Lab 02/16/18 1600 02/16/18 1925 02/16/18 2321 02/17/18 0426 02/17/18 0742 02/17/18 1145  GLUCAP 175* 189* 184* 196* 153* 103*    CXR: ARDS pattern    ASSESSMENT / PLAN:  57 yo AAM with h/o TBI admitted 42 days ago for severe resp failure and severe shock and pneumonia complicated by multiorgan failure and severe agitation failure to wean from vent s/p trach with ongoing severe hypoxic resp failure from edema and pneumonia      PULMONARY A: Acute hypoxemic respiratory failure Aspiration pneumonia ARDS. Sputum culture on 5/16 showed a few isolates of Klebsiella, patient is afebrile, will  repeat white count and chest x-ray if any changes and infiltrate will empirically cover  CARDIOVASCULAR A: septic shock, resolved Paroxysmal atrial fibrillation - NSR presently Dilated cardiomyopathy, etiology unclear Hypertension Intermittent sinus tachycardia P:  MAP goal > 65 mmHg Discontinue amiodarone as it might be contributing to lung injury Resume hydrochlorothiazide at reduced dose Continue scheduled metoprolol Resume lisinopril Continue hydralazine as needed to maintain SBP < 160 mmHg  RENAL A:   AKI, resolved Hypernatremia, resolved Hypervolemia/anasarca - improving Metabolic alkalosis due to loop diuresis, improving P: Monitor BMET intermittently Monitor I/Os Correct electrolytes as indicated  Continue free water per G-tube Furosemide X 1 dose 5/20  GASTROINTESTINAL A:   Abdominal distention, resolved Suspected UGIB, resolved G tube status Gastric ileus  P:   SUP: Enteral PPI Continue TF protocol - holding presently  HEMATOLOGIC A: ICU acquired anemia P: DVT px: enoxaparin Monitor CBC intermittently  Transfuse per usual guidelines   INFECTIOUS A:   Severe sepsis  with shock, resolved Aspiration pneumonia, treated Klebsiella pneumonia with bacteremia P:   Monitor temp, WBC count Micro and abx as above   ENDOCRINE A:   Type 2 diabetes, adequately controlled P:   Continue Lantus - increased 05/14 with initiation of methylpred Continue resistant scale SSI  NEUROLOGIC A:   Ventilator dyssynchrony ICU/ventilator associated discomfort History of schizophrenia History of TBI P:   Psychiatry consult to assist with severe anxiety agitation and delirium prohibiting weaning attempts on mechanical ventilation   Critical Care Time devoted to patient care services described in this note is 34 minutes.   Overall, patient is critically ill, prognosis is guarded. high risk for cardiac arrest and death.    Lucie Leather, M.D.  [facility]  Pulmonary & Critical Care Medicine  Medical Director [facility] Medical Director [facility] Cardio-Pulmonary Department

## 2018-02-17 NOTE — Progress Notes (Signed)
Patient ID: Tommy Mathews, male   DOB: [birth year], 57 y.o.   MRN: [id number]  [facility] PROGRESS NOTE  Tommy Mathews HBZ:[id number] DOB: [birth year] DOA: 01/06/2018 PCP: Isaias Cowman, MD  HPI/Subjective: Patient down to 60% FiO2,.  PEEP 12, still lethargic getting tube feeds Glucerna, on Precedex drip  Objective: Vitals:   02/17/18 1600 02/17/18 1612  BP: (!) 149/109   Pulse: 90 80  Resp: (!) 39 (!) 36  Temp:    SpO2: 94% 92%    Filed Weights   02/15/18 0334 02/16/18 0340 02/17/18 0226  Weight: 117.4 kg (258 lb 13.1 oz) 117.5 kg (259 lb 0.7 oz) 118.4 kg (261 lb 0.4 oz)    ROS: Review of Systems  Unable to perform ROS: Acuity of condition   Exam: Physical Exam  Constitutional: He appears lethargic.  HENT:  Nose: No mucosal edema.  Mouth/Throat: No oropharyngeal exudate or posterior oropharyngeal edema.  Tracheostomy site intact  Eyes: Pupils are equal, round, and reactive to light. Conjunctivae and lids are normal.  Neck: No JVD present. Carotid bruit is not present. No edema present. No thyroid mass and no thyromegaly present.  Cardiovascular: S1 normal and S2 normal. Exam reveals no gallop.  No murmur heard. Respiratory: No respiratory distress. He has decreased breath sounds in the right middle field, the right lower field, the left middle field and the left lower field. He has no wheezes. He has rhonchi in the right lower field and the left lower field. He has no rales.  GI: Soft. Bowel sounds are normal. He exhibits distension. There is no tenderness.  Musculoskeletal:       Right wrist: He exhibits swelling.       Left wrist: He exhibits swelling.       Right ankle: He exhibits swelling.       Left ankle: He exhibits swelling.  Lymphadenopathy:    He has no cervical adenopathy.  Neurological: He appears lethargic.  Sedated  Skin: Skin is warm. Nails show no clubbing.  As per nursing staff stage II decubitus on buttock  Psychiatric:  Sedated       Data Reviewed: Basic Metabolic Panel: Recent Labs  Lab 02/11/18 0311  02/13/18 0443 02/14/18 1017 02/15/18 0538 02/16/18 0805 02/16/18 2316 02/17/18 0513  NA 140   < > 139 143 146* 145 144  --   K 3.6   < > 4.3 3.7 3.3* 3.5 4.0  --   CL 91*   < > 94* 103 109 105 104  --   CO2 42*   < > 37* 33* 31 34* 34*  --   GLUCOSE 150*   < > 165* 184* 169* 182* 206*  --   BUN 34*   < > 39* 40* 38* 34* 37*  --   CREATININE 0.76   < > 0.76 0.56* 0.59* 0.43* 0.56*  --   CALCIUM 8.6*   < > 8.5* 8.1* 7.5* 7.7* 7.9*  --   MG 1.8  --   --  1.7 1.6*  --   --  1.4*  PHOS 2.2*  --  4.1  --   --   --   --  2.7   < > = values in this interval not displayed.   Liver Function Tests: Recent Labs  Lab 02/11/18 0311 02/12/18 0453 02/13/18 0443 02/14/18 0632 02/16/18 2316  AST 41 _0 ALT _1 ALKPHOS 90 95 94 84 69  BILITOT  0.4 0.3 0.4 0.5 0.4  PROT 6.4* 6.1* 6.3* 6.0* 5.5*  ALBUMIN 1.7* 1.6* 1.7* 1.7* 1.8*   CBC: Recent Labs  Lab 02/11/18 0311  02/13/18 0443 02/14/18 0632 02/15/18 0538 02/16/18 0805 02/17/18 0513  WBC 13.9*   < > 18.9* 13.9* 13.8* 12.4* 9.9  NEUTROABS 10.3*  --   --   --  11.6* 10.5*  --   HGB 6.4*   < > 8.1* 8.7* 8.3* 8.9* 8.1*  HCT 20.3*   < > 25.8* 27.6* 26.6* 28.1* 25.0*  MCV 85.1   < > 85.6 86.9 86.8 87.3 88.0  PLT 359   < > 352 307 329 290 241   < > = values in this interval not displayed.   BNP (last 3 results) Recent Labs    01/06/18 1944 01/16/18 2332 02/12/18 0453  BNP 37.0 402.0* 255.0*     CBG: Recent Labs  Lab 02/16/18 1925 02/16/18 2321 02/17/18 0426 02/17/18 0742 02/17/18 1145  GLUCAP 189* 184* 196* 153* 103*    Recent Results (from the past 240 hour(s))  Culture, respiratory (NON-Expectorated)     Status: None   Collection Time: 02/10/18 12:02 PM  Result Value Ref Range Status   Specimen Description   Final    TRACHEAL ASPIRATE Performed at [facility], [address], [place], [place]  [address]    Special Requests   Final    NONE Performed at [facility], [address] [place], [place] [address]    Gram Stain   Final    MODERATE WBC PRESENT,BOTH PMN AND MONONUCLEAR NO ORGANISMS SEEN    Culture   Final    Consistent with normal respiratory flora. Performed at [facility], [place] [address] [place], [place] [address]    Report Status 02/12/2018 FINAL  Final  Culture, respiratory (NON-Expectorated)     Status: None   Collection Time: 02/13/18  1:42 PM  Result Value Ref Range Status   Specimen Description   Final    TRACHEAL ASPIRATE Performed at [facility], [address], [place], [place] [address]    Special Requests   Final    NONE Performed at [facility], [address] [place], [place] [address]    Gram Stain   Final    ABUNDANT WBC PRESENT,BOTH PMN AND MONONUCLEAR RARE YEAST RARE GRAM VARIABLE ROD Performed at [facility], [place] [address] [place], [place] [address]    Culture FEW KLEBSIELLA PNEUMONIAE  Final   Report Status 02/16/2018 FINAL  Final   Organism ID, Bacteria KLEBSIELLA PNEUMONIAE  Final      Susceptibility   Klebsiella pneumoniae - MIC*    AMPICILLIN >=32 RESISTANT Resistant     CEFAZOLIN <=4 SENSITIVE Sensitive     CEFEPIME <=1 SENSITIVE Sensitive     CEFTAZIDIME <=1 SENSITIVE Sensitive     CEFTRIAXONE <=1 SENSITIVE Sensitive     CIPROFLOXACIN <=0.25 SENSITIVE Sensitive     GENTAMICIN <=1 SENSITIVE Sensitive     IMIPENEM <=0.25 SENSITIVE Sensitive     TRIMETH/SULFA <=20 SENSITIVE Sensitive     AMPICILLIN/SULBACTAM 16 INTERMEDIATE Intermediate     PIP/TAZO 16 SENSITIVE Sensitive     Extended ESBL NEGATIVE Sensitive     * FEW KLEBSIELLA PNEUMONIAE     Studies: No results found.  Scheduled Meds: . ALPRAZolam  1 mg Oral TID  . budesonide (PULMICORT) nebulizer solution  0.5 mg Nebulization BID  . chlorhexidine gluconate (MEDLINE KIT)  15  mL Mouth Rinse BID  . digoxin  0.25 mg Per  Tube Daily  . docusate  100 mg Per Tube BID  . enoxaparin (LOVENOX) injection  40 mg Subcutaneous Daily  . fentaNYL  100 mcg Transdermal Q72H  . free water  200 mL Per Tube Q8H  . gabapentin  200 mg Per Tube Q8H  . insulin aspart  0-20 Units Subcutaneous Q4H  . insulin glargine  20 Units Subcutaneous QHS  . ipratropium-albuterol  3 mL Nebulization Q4H  . lisinopril  10 mg Per Tube Daily  . magnesium oxide  400 mg Per Tube BID  . mouth rinse  15 mL Mouth Rinse 10 times per day  . methylPREDNISolone (SOLU-MEDROL) injection  20 mg Intravenous Q12H  . metoprolol tartrate  12.5 mg Per Tube Q6H  . nutrition supplement (JUVEN)  1 packet Per Tube BID  . nystatin  5 mL Oral QID  . pantoprazole sodium  40 mg Per Tube Daily  . QUEtiapine  500 mg Oral BID  . sennosides  5 mL Per Tube QHS  . valproic acid  1,000 mg Per Tube BID   Continuous Infusions: . dexmedetomidine (PRECEDEX) IV infusion 0.8 mcg/kg/hr (02/17/18 1227)  . feeding supplement (GLUCERNA 1.5 CAL) 1,000 mL (02/17/18 0606)    Assessment/Plan:  1. Acute hypoxic respiratory failure/asp pna , concerning for ARDS.  Status post tracheostomy    patient made a DNR.  On Solu-Cortef taper.  Patient started on Precedex drip 2. Ventilator associated pneumonia with sepsis with Klebsiella.  Finished antibiotics. 3. Atrial fibrillation and episodes of SVT.  Currently sinus rhythm heart rate improved on digoxin and metoprolol.  Amiodarone discontinued secondary to worsening respiratory status. 4. Acute on chronic systolic congestive heart failure with anasarca.   On lisinopril and metoprolol. 5. Acute encephalopathy with history of traumatic brain injury. 6. Recent GI bleed on PPI.  Endoscopy negative.  Continue to watch hemoglobin closely.  Received a unit of blood yesterday.  hemoglobin is stable 7. Acute kidney injury.  Has resolved 8. Type 2 diabetes mellitus on glargine insulin and sliding scale 9. Nutrition.  On tube feeds as per  intensivist 10. History of traumatic brain injury, depression with severe anxiety-seen by psychiatrist plan is to continue Depakene, Seroquel dose increased to his home dose 500 mg  twice daily 11. Stage II decubitus on buttock-continue wound care  Code Status:     Code Status Orders  (From admission, onward)        Start     Ordered   01/06/18 2227  Full code  Continuous     01/06/18 2226    Code Status History    This patient has a current code status but no historical code status.     Family Communication: As per critical care specialist  disposition Plan: To be determined  Consultants:  Critical care specialist  Infectious disease  Time spent: 23 minutes.  Nicholes Mango  [employer]

## 2018-02-17 NOTE — Progress Notes (Signed)
Pharmacy Electrolyte Monitoring Consult:  Pharmacy consulted to assist in monitoring and replacing electrolytes in this 57 y.o. male admitted on 01/06/2018 with Respiratory Distress  Patient ordered furosemide  IV X 1 on 5/20.   Labs:  Sodium (mmol/L)  Date Value  02/16/2018 144   Potassium (mmol/L)  Date Value  02/16/2018 4.0   Magnesium (mg/dL)  Date Value  96/12/5407 1.4 (L)   Phosphorus (mg/dL)  Date Value  81/19/1478 2.7   Calcium (mg/dL)  Date Value  29/56/2130 7.9 (L)   Albumin (g/dL)  Date Value  86/57/8469 1.8 (L)   Corrected Calcium: 9.6  Assessment/Plan: Will order potassium VT x 1 and magnesium 4g IV x 1. Will recheck electrolytes with am labs.   Pharmacy will continue to monitor and adjust per consult.   Zanovia Rotz L 02/17/2018 2:59 PM

## 2018-02-17 NOTE — Clinical Social Work Note (Signed)
Patient back on precedex drip. Not yet ready for discharge. York Spaniel MSW,LCSW [phone]

## 2018-02-18 LAB — BASIC METABOLIC PANEL
Anion gap: 2 — ABNORMAL LOW (ref 5–15)
BUN: 34 mg/dL — AB (ref 6–20)
CALCIUM: 7.6 mg/dL — AB (ref 8.9–10.3)
CHLORIDE: 103 mmol/L (ref 101–111)
CO2: 39 mmol/L — ABNORMAL HIGH (ref 22–32)
CREATININE: 0.61 mg/dL (ref 0.61–1.24)
GFR calc Af Amer: >60 mL/min (ref >60)
GFR calc non Af Amer: >60 mL/min (ref >60)
Glucose, Bld: 187 mg/dL — ABNORMAL HIGH (ref 65–99)
Potassium: 4.7 mmol/L (ref 3.5–5.1)
Sodium: 144 mmol/L (ref 135–145)

## 2018-02-18 LAB — ALBUMIN: ALBUMIN: 1.8 g/dL — AB (ref 3.5–5.0)

## 2018-02-18 LAB — GLUCOSE, CAPILLARY
GLUCOSE-CAPILLARY: 180 mg/dL — AB (ref 65–99)
GLUCOSE-CAPILLARY: 188 mg/dL — AB (ref 65–99)
GLUCOSE-CAPILLARY: 169 mg/dL — AB (ref 65–99)
Glucose-Capillary: 169 mg/dL — ABNORMAL HIGH (ref 65–99)
Glucose-Capillary: 210 mg/dL — ABNORMAL HIGH (ref 65–99)
Glucose-Capillary: 210 mg/dL — ABNORMAL HIGH (ref 65–99)

## 2018-02-18 LAB — MAGNESIUM: MAGNESIUM: 2.2 mg/dL (ref 1.7–2.4)

## 2018-02-18 MED ORDER — FUROSEMIDE 10 MG/ML IJ SOLN
60.0000 mg | Freq: Once | INTRAMUSCULAR | Status: AC
  Administered 2018-02-18: 60 mg via INTRAVENOUS
  Filled 2018-02-18: qty 6

## 2018-02-18 NOTE — Progress Notes (Signed)
Patient ID: Tommy Mathews, male   DOB: 06-Jul-1961, 57 y.o.   MRN: [id number]  [facility] PROGRESS NOTE  Chevy Sweigert [facility]:[id number] DOB: 1961-05-14 DOA: 01/06/2018 PCP: Isaias Cowman, MD  HPI/Subjective: Patient down to 50% FiO2,.  PEEP 12, communicates some as reported by the ICU staff ,getting tube feeds Glucerna  Objective: Vitals:   02/18/18 1500 02/18/18 1516  BP: (!) 147/75   Pulse: 85 78  Resp: (!) 30 (!) 24  Temp:    SpO2: 93% 98%    Filed Weights   02/16/18 0340 02/17/18 0226 02/18/18 0451  Weight: 117.5 kg (259 lb 0.7 oz) 118.4 kg (261 lb 0.4 oz) 119.1 kg (262 lb 9.1 oz)    ROS: Review of Systems  Unable to perform ROS: Acuity of condition   Exam: Physical Exam  Constitutional: He appears lethargic.  HENT:  Nose: No mucosal edema.  Mouth/Throat: No oropharyngeal exudate or posterior oropharyngeal edema.  Tracheostomy site intact  Eyes: Pupils are equal, round, and reactive to light. Conjunctivae and lids are normal.  Neck: No JVD present. Carotid bruit is not present. No edema present. No thyroid mass and no thyromegaly present.  Cardiovascular: S1 normal and S2 normal. Exam reveals no gallop.  No murmur heard. Respiratory: No respiratory distress. He has decreased breath sounds in the right middle field, the right lower field, the left middle field and the left lower field. He has no wheezes. He has rhonchi in the right lower field and the left lower field. He has no rales.  GI: Soft. Bowel sounds are normal. He exhibits distension. There is no tenderness.  Musculoskeletal:       Right wrist: He exhibits swelling.       Left wrist: He exhibits swelling.       Right ankle: He exhibits swelling.       Left ankle: He exhibits swelling.  Lymphadenopathy:    He has no cervical adenopathy.  Neurological: He appears lethargic.  Sedated  Skin: Skin is warm. Nails show no clubbing.  As per nursing staff stage II decubitus on buttock  Psychiatric:   Sedated      Data Reviewed: Basic Metabolic Panel: Recent Labs  Lab 02/13/18 0443 02/14/18 6659 02/15/18 0538 02/16/18 0805 02/16/18 2316 02/17/18 0513 02/18/18 0452  NA 139 143 146* 145 144  --  144  K 4.3 3.7 3.3* 3.5 4.0  --  4.7  CL 94* 103 109 105 104  --  103  CO2 37* 33* 31 34* 34*  --  39*  GLUCOSE 165* 184* 169* 182* 206*  --  187*  BUN 39* 40* 38* 34* 37*  --  34*  CREATININE 0.76 0.56* 0.59* 0.43* 0.56*  --  0.61  CALCIUM 8.5* 8.1* 7.5* 7.7* 7.9*  --  7.6*  MG  --  1.7 1.6*  --   --  1.4* 2.2  PHOS 4.1  --   --   --   --  2.7  --    Liver Function Tests: Recent Labs  Lab 02/12/18 0453 02/13/18 0443 02/14/18 0632 02/16/18 2316 02/18/18 0452  AST '25 24 31 20  ' --   ALT '23 22 28 19  ' --   ALKPHOS 95 94 84 69  --   BILITOT 0.3 0.4 0.5 0.4  --   PROT 6.1* 6.3* 6.0* 5.5*  --   ALBUMIN 1.6* 1.7* 1.7* 1.8* 1.8*   CBC: Recent Labs  Lab 02/13/18 0443 02/14/18 9357 02/15/18 0538 02/16/18 0805 02/17/18  0513  WBC 18.9* 13.9* 13.8* 12.4* 9.9  NEUTROABS  --   --  11.6* 10.5*  --   HGB 8.1* 8.7* 8.3* 8.9* 8.1*  HCT 25.8* 27.6* 26.6* 28.1* 25.0*  MCV 85.6 86.9 86.8 87.3 88.0  PLT 352 307 329 290 241   BNP (last 3 results) Recent Labs    01/06/18 1944 01/16/18 2332 02/12/18 0453  BNP 37.0 402.0* 255.0*     CBG: Recent Labs  Lab 02/17/18 1945 02/17/18 2341 02/18/18 0408 02/18/18 0747 02/18/18 1137  GLUCAP 169* 171* 169* 180* 210*    Recent Results (from the past 240 hour(s))  Culture, respiratory (NON-Expectorated)     Status: None   Collection Time: 02/10/18 12:02 PM  Result Value Ref Range Status   Specimen Description   Final    TRACHEAL ASPIRATE Performed at [facility], [address], [place], [place] [address]    Special Requests   Final    NONE Performed at [facility], [address] [place], [place] [address]    Gram Stain   Final    MODERATE WBC PRESENT,BOTH PMN AND MONONUCLEAR NO ORGANISMS SEEN     Culture   Final    Consistent with normal respiratory flora. Performed at [facility], [place] [address] [place], [place] [address]    Report Status 02/12/2018 FINAL  Final  Culture, respiratory (NON-Expectorated)     Status: None   Collection Time: 02/13/18  1:42 PM  Result Value Ref Range Status   Specimen Description   Final    TRACHEAL ASPIRATE Performed at [facility], [address], [place], [place] [address]    Special Requests   Final    NONE Performed at [facility], [address] [place], [place] [address]    Gram Stain   Final    ABUNDANT WBC PRESENT,BOTH PMN AND MONONUCLEAR RARE YEAST RARE GRAM VARIABLE ROD Performed at [facility], [place] [address] [place],  [address]    Culture FEW KLEBSIELLA PNEUMONIAE  Final   Report Status 02/16/2018 FINAL  Final   Organism ID, Bacteria KLEBSIELLA PNEUMONIAE  Final      Susceptibility   Klebsiella pneumoniae - MIC*    AMPICILLIN >=32 RESISTANT Resistant     CEFAZOLIN <=4 SENSITIVE Sensitive     CEFEPIME <=1 SENSITIVE Sensitive     CEFTAZIDIME <=1 SENSITIVE Sensitive     CEFTRIAXONE <=1 SENSITIVE Sensitive     CIPROFLOXACIN <=0.25 SENSITIVE Sensitive     GENTAMICIN <=1 SENSITIVE Sensitive     IMIPENEM <=0.25 SENSITIVE Sensitive     TRIMETH/SULFA <=20 SENSITIVE Sensitive     AMPICILLIN/SULBACTAM 16 INTERMEDIATE Intermediate     PIP/TAZO 16 SENSITIVE Sensitive     Extended ESBL NEGATIVE Sensitive     * FEW KLEBSIELLA PNEUMONIAE     Studies: No results found.  Scheduled Meds: . ALPRAZolam  1 mg Oral TID  . budesonide (PULMICORT) nebulizer solution  0.5 mg Nebulization BID  . chlorhexidine gluconate (MEDLINE KIT)  15 mL Mouth Rinse BID  . digoxin  0.25 mg Per Tube Daily  . enoxaparin (LOVENOX) injection  40 mg Subcutaneous Daily  . fentaNYL  100 mcg Transdermal Q72H  . free water  200 mL Per Tube Q8H  . gabapentin  200 mg Per Tube Q8H  . insulin aspart  0-20 Units  Subcutaneous Q4H  . insulin glargine  20 Units Subcutaneous QHS  . ipratropium-albuterol  3 mL Nebulization Q4H  .  lisinopril  10 mg Per Tube Daily  . magnesium oxide  400 mg Per Tube BID  . mouth rinse  15 mL Mouth Rinse 10 times per day  . methylPREDNISolone (SOLU-MEDROL) injection  20 mg Intravenous Q12H  . metoprolol tartrate  12.5 mg Per Tube Q6H  . nutrition supplement (JUVEN)  1 packet Per Tube BID  . nystatin  5 mL Oral QID  . pantoprazole sodium  40 mg Per Tube Daily  . QUEtiapine  500 mg Oral BID  . valproic acid  1,000 mg Per Tube BID   Continuous Infusions: . dexmedetomidine (PRECEDEX) IV infusion 1.2 mcg/kg/hr (02/18/18 1345)  . feeding supplement (GLUCERNA 1.5 CAL) 1,000 mL (02/18/18 1517)    Assessment/Plan:  1. Acute hypoxic respiratory failure/asp pna , concerning for ARDS.  Status post tracheostomy    patient made a DNR.  On Solu-Cortef taper.  2. Ventilator associated pneumonia with sepsis with Klebsiella.  Finished antibiotics.  Flutter device 3. Atrial fibrillation and episodes of SVT.  Currently sinus rhythm heart rate improved on digoxin and metoprolol.  Amiodarone discontinued secondary to worsening respiratory status. 4. Acute on chronic systolic congestive heart failure with anasarca.   On lisinopril and metoprolol. 5. Acute encephalopathy with history of traumatic brain injury. 6. Recent GI bleed on PPI.  Endoscopy negative.  Continue to watch hemoglobin closely.  Received a unit of blood yesterday.  hemoglobin is stable 7. Acute kidney injury.  Has resolved 8. Type 2 diabetes mellitus on glargine insulin and sliding scale 9. Nutrition.  On tube feeds as per intensivist 10. History of traumatic brain injury, depression with severe anxiety-seen by psychiatrist plan is to continue Depakene, Seroquel dose increased to his home dose 500 mg  twice daily 11. Stage II decubitus on buttock-continue wound care  Code Status:     Code Status Orders  (From  admission, onward)        Start     Ordered   01/06/18 2227  Full code  Continuous     01/06/18 2226    Code Status History    This patient has a current code status but no historical code status.     Family Communication: As per critical care specialist  disposition Plan: To be determined  Consultants:  Critical care specialist  Infectious disease  Time spent: 23 minutes.  Nicholes Mango  [employer]

## 2018-02-18 NOTE — Progress Notes (Signed)
Pharmacy Electrolyte Monitoring Consult:  Pharmacy consulted to assist in monitoring and replacing electrolytes in this 57 y.o. male admitted on 01/06/2018 with Respiratory Distress  Patient ordered furosemide  IV X 1 on 5/21.   Labs:  Sodium (mmol/L)  Date Value  02/18/2018 144   Potassium (mmol/L)  Date Value  02/18/2018 4.7   Magnesium (mg/dL)  Date Value  11/91/4782 2.2   Phosphorus (mg/dL)  Date Value  95/62/1308 2.7   Calcium (mg/dL)  Date Value  65/78/4696 7.6 (L)   Albumin (g/dL)  Date Value  29/52/8413 1.8 (L)   Corrected Calcium: 9.4  Assessment/Plan: No replacement warranted. Will recheck electrolytes with am labs.   Pharmacy will continue to monitor and adjust per consult.   Tommy Mathews 02/18/2018 2:46 PM

## 2018-02-18 NOTE — Progress Notes (Signed)
Nutrition Follow-up  DOCUMENTATION CODES:   Obesity unspecified  INTERVENTION:  Continue Glucerna 1.5 Cal at 65 mL/hr (1560 mL goal daily volume). Provides 2340 kcal, 129 grams of protein, 25 grams of fiber, 1186 mL H2O daily.  Continue free water flush of 200 mL Q8hrs, which provides a total of 1786 mL H2O daily including water in tube feeding.  Continue Juven BID per tube, each supplement provides 80 kcal, 14 grams of amino acids, and vitamins/minerals needed for wound healing.  NUTRITION DIAGNOSIS:   Inadequate oral intake related to inability to eat as evidenced by NPO status.  Ongoing - addressing with TF regimen.  GOAL:   Provide needs based on ASPEN/SCCM guidelines  Met with TF regimen.  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   -Tube feeds were held on 5/15 as patient had regurgitation in the AM. Were resumed on 5/16.  Patient remains on mechanical ventilation via tracheostomy tube. Currently in pressure control mode. On Precedex gtt at 0.5 mg/kg/hr with plan to continue weaning. Patient has had multiple rectal tubes and last was removed on 5/16. Had multiple large bowel movements yesterday (type 6). Per documentation of wound, 100% of his stage II wound base is now red or granulating.  Access: 20 Fr G-tube with C-clamp placed 4/23; placement verified by relook endoscopy; 4.5 cm at external bumper  MAP: 68-108 mmHg  TF: pt continues to tolerate Glucerna 1.5 at 65 mL/hr; per pump history he received 1366  mL tube feeds in the past 24 hrs (87.6% goal daily volume)  Patient is currently intubated on ventilator support Ve: 10 L/min Temp (24hrs), Avg:99 F (37.2 C), Min:98.2 F (36.8 C), Max:99.7 F (37.6 C)  Propofol: N/A  Medications reviewed and include: Xanax 1 mg TID, digoxin, fentanyl patch, free water flush 200 mL Q8hrs, Lasix 60 mg once today IV, gabapentin, Novolog 0-20 units Q4hrs, Lantus 20 units QHS, magnesium oxide 400 mg BID, Solu-Medrol 20 mg Q12hrs IV, Juven BID, Protonix, Seroquel, valproic acid, Precedex gtt.  Labs reviewed: CBG 103-180 past 24 hrs, CO2 39, BUN 34, Magnesium 1.8. Potassium and Phosphorus WNL.  I/O: 3600 mL UOP yesterday (1.3 mL/kg/hr); 4 BMs yesterday (all large type 6)  Weight trend: 119.1 kg on 5/21; wt trending down from highest weight of 139.5 kg on 4/22; still +11.7 kg from admission  Discussed with RN and on rounds. Patient received a one-time dose of Lasix yesterday for diuresis and responded well. Plan is for another dose today as patient is still volume overloaded/edematous.  Diet Order:   Diet Order    None      EDUCATION NEEDS:   Not appropriate for education at this time  Skin:  Skin Assessment: Skin Integrity Issues: Skin Integrity Issues:: Stage II, Other (Comment) Stage II: coccyx (now 100% of wound base red or granulating) Other: open blister b/l thighs; MSAD to perineum  Last BM:  02/18/2018 - large type 6  Height:   Ht Readings from Last 1 Encounters:  01/07/18 '5\' 10"'  (1.778 m)    Weight:   Wt Readings  from Last 1 Encounters:  02/18/18 262 lb 9.1 oz (119.1 kg)    Ideal Body Weight:  75.5 kg  BMI:  Body mass index is 37.67 kg/m.  Estimated Nutritional Needs:   Kcal:  2295 (PSU 2003b w/ MSJ 1919, Ve 11.9, Tmax 37.7)  Protein:  113-135 grams (1.5-1.8 grams/kg IBW)  Fluid:  1.8 L/day  Willey Blade, MS, RD, LDN Office: [phone] Pager: [phone] After Hours/Weekend Pager: [phone]

## 2018-02-18 NOTE — Progress Notes (Signed)
PULMONARY / CRITICAL CARE MEDICINE   Name: Tommy Mathews MRN: [id number] DOB: [birth year]    ADMISSION DATE:  01/06/2018    PT PROFILE:  57 M SNF resident due to psychiatric illness and history of TBI admitted with acute hypoxemic respiratory failure, severe bilateral pulmonary infiltrates, severe sepsis/septic shock.  Intubated in the ED shortly after arrival.  Was also noted to have dark coffee-ground material suctioned from stomach.  Patient chronically on rivaroxaban.   MAJOR EVENTS/TEST RESULTS: 04/08 Admission as above 04/08 CT chest: Multifocal severe pneumonia with RUL collapse and soft tissue obstructing RUL bronchus.  04/08 CTAP: Mild small bowel ileus, potential enteritis. Nasogastric tube terminates in distal stomach. Cholelithiasis without CT findings of acute cholecystitis.Consider bronchoscopy.  04/09 nephrology consultation: No acute indication for dialysis at present. 04/09 gastroenterology consultation: Recommend high-dose PPI therapy. 04/10 severely hypoxemic despite 100% FiO2.  Recruitment maneuver with significant improvement.  PEEP increased and ARDS strategy on ventilator implemented.  Neuromuscular blockade implemented due to ventilator dyssynchrony. 04/11 PAF with RVR > converted with amiodarone. Gas exchange improving. Off Nimbex 04/12 Echocardiogram: EF 20-25%.  Diffuse HK. 04/13 High airway pressure with vent dyssynchony; given Vecuronium; FIO2 increased to 50%  04/16 Cardiology consultation 04/13 Amiodarone D/C because of SB 04/15 increased HR, EF 20% afib with RVR 04/17 failed weaning trials, HR remains elevated 04/19 ENT consultation for trach tube placement 04/20 no significant changes in the last 24 hours. Has not tolerated any weaning  04/23 PEG placed. 04/23 intermittent ventilator dyssynchrony - sedation regimen adjusted 04/24 Somewhat more responsive. Not F/C 04/25 Trach tube placed 04/25 significantly more responsive 04/30 and hypoxemia and pulmonary  infiltrates 05/02 fever, leukocytosis 05/03 severe ARDS pattern on CXR 05/03 ID consultation 05/14 goals of care discussion with patient's sister and mother.  Agree to continue aggressive support but DNR in event of cardiac arrest. Received one unit RBCs for Hgb 6.4. For severe ARDS, NMB protocol initiated 05/15 Gas exchange much improved. Remains on NMB. Received one unit RBCs for Hgb 6.9 05/20 high fio2 requirements at 60%, PEEP at 12-plan for diuresis  CC follow up resp failure  SUBJECTIVE Remains on high vent support Remains critically ill +fluid balance 27L Needs vent to survive at this time  INDWELLING DEVICES: L femoral CVL 04/08 >> 4/16 ETT 04/08 >> 04/25 RUE PICC 4/15 >>  PEG 04/23 >>  Trach tube 04/25 >>   MICRO DATA: MRSA PCR 04/08, 04/20 >> POS, 05/08 >> NEG Urine 04/08 >> 10k coag negative staph Resp 04/09 >> c/w NOF Blood 04/08 >> NEG Blood 04/09 >> NEG Resp 04/20 >> NOF Urine 05/03 >> NEG  Resp 05/03 >> NOF Blood 05/03 >> 2/2 Klebsiella Blood 05/07 >> NEG Resp 05/13 >> NOF   ANTIMICROBIALS:  Unasyn 04/08 >> 04/10 Vanc 04/08 >> 4/15, 05/03 >> 05/10 Cefepime 04/10 >> 4/15 Metronidazole 04/10 >> 4/15 Meropenem 05/03 >> 05/10 Cefazolin 05/11 >> 05/14 Ceftriaxone 05/14 >> 05/15     VITAL SIGNS: BP 129/67   Pulse 71   Temp 98.9 F (37.2 C) (Oral)   Resp 14   Ht  (1.778 m)   Wt 262 lb 9.1 oz (119.1 kg)   SpO2 99%   BMI 37.67 kg/m    VENTILATOR SETTINGS: Vent Mode: PCV FiO2 (%):  [60 %] 60 % Set Rate:  [18 bmp] 18 bmp PEEP:  [12 cmH20] 12 cmH20  INTAKE / OUTPUT: I/O last 3 completed shifts: In: 4481.7 [I.V.:802.2; Other:300; NG/GT:3279.5; IV Piggyback:100] Out: 4275 [Urine:4275] PHYSICAL  EXAMINATION: General: RASS N/A on NMB Neuro: PERRL HEENT: NCAT, sclerae white Cardiovascular: Regular, no M Lungs: No wheezes, few crackles dependently Abdomen: Nontender, + BS GU: minimal scrotal edema Extremities: Symmetric BLE  edema   LABS:  BMET Recent Labs  Lab 02/16/18 0805 02/16/18 2316 02/18/18 0452  NA 145 144 144  K 3.5 4.0 4.7  CL 105 104 103  CO2 34* 34* 39*  BUN 34* 37* 34*  CREATININE 0.43* 0.56* 0.61  GLUCOSE 182* 206* 187*    Electrolytes Recent Labs  Lab 02/13/18 0443  02/15/18 0538 02/16/18 0805 02/16/18 2316 02/17/18 0513 02/18/18 0452  CALCIUM 8.5*   < > 7.5* 7.7* 7.9*  --  7.6*  MG  --    < > 1.6*  --   --  1.4* 2.2  PHOS 4.1  --   --   --   --  2.7  --    < > = values in this interval not displayed.    CBC Recent Labs  Lab 02/15/18 0538 02/16/18 0805 02/17/18 0513  WBC 13.8* 12.4* 9.9  HGB 8.3* 8.9* 8.1*  HCT 26.6* 28.1* 25.0*  PLT 329 290 241    Coag's No results for input(s): APTT, INR in the last 168 hours.  Sepsis Markers No results for input(s): LATICACIDVEN, PROCALCITON, O2SATVEN in the last 168 hours.  ABG No results for input(s): PHART, PCO2ART, PO2ART in the last 168 hours.  Liver Enzymes Recent Labs  Lab 02/13/18 0443 02/14/18 0632 02/16/18 2316 02/18/18 0452  AST --   ALT --   ALKPHOS 94 84 69  --   BILITOT 0.4 0.5 0.4  --   ALBUMIN 1.7* 1.7* 1.8* 1.8*    Cardiac Enzymes No results for input(s): TROPONINI, PROBNP in the last 168 hours.  Glucose Recent Labs  Lab 02/17/18 1145 02/17/18 1651 02/17/18 1945 02/17/18 2341 02/18/18 0408 02/18/18 0747  GLUCAP 103* 147* 169* 171* 169* 180*    CXR: ARDS pattern    ASSESSMENT / PLAN:  57 yo AAM with h/o TBI admitted 42 days ago for severe resp failure and severe shock and pneumonia complicated by multiorgan failure and severe agitation failure to wean from vent s/p trach with ongoing severe hypoxic resp failure from edema and pneumonia     PULMONARY A: Acute hypoxemic respiratory failure Aspiration pneumonia ARDS. Sputum culture on 5/16 showed a few isolates of Klebsiella, patient is afebrile, will repeat white count and chest x-ray if any changes and  infiltrate will empirically cover  CARDIOVASCULAR A: septic shock, resolved Paroxysmal atrial fibrillation - NSR presently Dilated cardiomyopathy, etiology unclear Hypertension Intermittent sinus tachycardia P:  MAP goal > 65 mmHg Discontinue amiodarone as it might be contributing to lung injury Resume hydrochlorothiazide at reduced dose Continue scheduled metoprolol Resume lisinopril Continue hydralazine as needed to maintain SBP < 160 mmHg  RENAL A:   AKI, resolved Hypernatremia, resolved Hypervolemia/anasarca - improving Metabolic alkalosis due to loop diuresis, improving P: Monitor BMET intermittently Monitor I/Os Correct electrolytes as indicated  Continue free water per G-tube Furosemide again today  GASTROINTESTINAL A:   Abdominal distention, resolved Suspected UGIB, resolved G tube status Gastric ileus  P:   SUP: Enteral PPI Continue TF protocol - holding presently  HEMATOLOGIC A: ICU acquired anemia P: DVT px: enoxaparin Monitor CBC intermittently  Transfuse per usual guidelines   INFECTIOUS A:   Severe sepsis with shock, resolved Aspiration pneumonia, treated Klebsiella pneumonia with bacteremia P:  Monitor temp, WBC count Micro and abx as above   ENDOCRINE A:   Type 2 diabetes, adequately controlled P:   Continue Lantus - increased 05/14 with initiation of methylpred Continue resistant scale SSI  NEUROLOGIC A:   Ventilator dyssynchrony ICU/ventilator associated discomfort History of schizophrenia History of TBI P:   Psychiatry consult to assist with severe anxiety agitation and delirium prohibiting weaning attempts on mechanical ventilation   Critical Care Time devoted to patient care services described in this note is 31 minutes.   Overall, patient is critically ill, prognosis is guarded. high risk for cardiac arrest and death.    Lucie Leather, M.D.  [facility]  Medical Director [facility]  [facility] Medical Director [facility] Cardio-Pulmonary Department

## 2018-02-19 LAB — PHOSPHORUS: PHOSPHORUS: 3.9 mg/dL (ref 2.5–4.6)

## 2018-02-19 LAB — BASIC METABOLIC PANEL
ANION GAP: 7 (ref 5–15)
BUN: 35 mg/dL — ABNORMAL HIGH (ref 6–20)
CO2: 37 mmol/L — AB (ref 22–32)
Calcium: 8.2 mg/dL — ABNORMAL LOW (ref 8.9–10.3)
Chloride: 98 mmol/L — ABNORMAL LOW (ref 101–111)
Creatinine, Ser: 0.6 mg/dL — ABNORMAL LOW (ref 0.61–1.24)
GLUCOSE: 206 mg/dL — AB (ref 65–99)
POTASSIUM: 4.6 mmol/L (ref 3.5–5.1)
Sodium: 142 mmol/L (ref 135–145)

## 2018-02-19 LAB — CBC WITH DIFFERENTIAL/PLATELET
BASOS ABS: 0.1 10*3/uL (ref 0–0.1)
BASOS PCT: 1 %
EOS ABS: 0 10*3/uL (ref 0–0.7)
Eosinophils Relative: 0 %
HEMATOCRIT: 27.4 % — AB (ref 40.0–52.0)
HEMOGLOBIN: 8.7 g/dL — AB (ref 13.0–18.0)
LYMPHS PCT: 14 %
Lymphs Abs: 1.3 10*3/uL (ref 1.0–3.6)
MCH: 27.8 pg (ref 26.0–34.0)
MCHC: 31.8 g/dL — AB (ref 32.0–36.0)
MCV: 87.4 fL (ref 80.0–100.0)
MONOS PCT: 5 %
Monocytes Absolute: 0.5 10*3/uL (ref 0.2–1.0)
NEUTROS ABS: 7.6 10*3/uL — AB (ref 1.4–6.5)
NEUTROS PCT: 80 %
Platelets: 226 10*3/uL (ref 150–440)
RBC: 3.14 MIL/uL — ABNORMAL LOW (ref 4.40–5.90)
RDW: 19.7 % — ABNORMAL HIGH (ref 11.5–14.5)
WBC: 9.5 10*3/uL (ref 3.8–10.6)

## 2018-02-19 LAB — GLUCOSE, CAPILLARY
GLUCOSE-CAPILLARY: 211 mg/dL — AB (ref 65–99)
GLUCOSE-CAPILLARY: 71 mg/dL (ref 65–99)
GLUCOSE-CAPILLARY: 106 mg/dL — AB (ref 65–99)
Glucose-Capillary: 158 mg/dL — ABNORMAL HIGH (ref 65–99)
Glucose-Capillary: 105 mg/dL — ABNORMAL HIGH (ref 65–99)
Glucose-Capillary: 138 mg/dL — ABNORMAL HIGH (ref 65–99)

## 2018-02-19 LAB — MAGNESIUM: Magnesium: 1.9 mg/dL (ref 1.7–2.4)

## 2018-02-19 MED ORDER — ALPRAZOLAM 1 MG PO TABS
1.5000 mg | ORAL_TABLET | Freq: Three times a day (TID) | ORAL | Status: DC
  Administered 2018-02-19 – 2018-02-22 (×10): 1.5 mg via ORAL
  Filled 2018-02-19 (×9): qty 1

## 2018-02-19 MED ORDER — FUROSEMIDE 10 MG/ML IJ SOLN
60.0000 mg | Freq: Once | INTRAMUSCULAR | Status: AC
  Administered 2018-02-19: 60 mg via INTRAVENOUS
  Filled 2018-02-19: qty 6

## 2018-02-19 MED ORDER — ZIPRASIDONE MESYLATE 20 MG IM SOLR
20.0000 mg | Freq: Two times a day (BID) | INTRAMUSCULAR | Status: DC
  Administered 2018-02-19 – 2018-02-21 (×5): 20 mg via INTRAMUSCULAR
  Filled 2018-02-19 (×10): qty 20

## 2018-02-19 MED ORDER — ZIPRASIDONE MESYLATE 20 MG IM SOLR
20.0000 mg | Freq: Once | INTRAMUSCULAR | Status: AC
  Administered 2018-02-19: 20 mg via INTRAMUSCULAR
  Filled 2018-02-19: qty 20

## 2018-02-19 NOTE — Progress Notes (Signed)
PULMONARY / CRITICAL CARE MEDICINE   Name: Tommy Mathews MRN: [id number] DOB: [birth year]    ADMISSION DATE:  01/06/2018   PT PROFILE:  29 M SNF resident due to psychiatric illness and history of TBI admitted with acute hypoxemic respiratory failure, severe bilateral pulmonary infiltrates, severe sepsis/septic shock.  Intubated in the ED shortly after arrival.  Was also noted to have dark coffee-ground material suctioned from stomach.  Patient chronically on rivaroxaban.   MAJOR EVENTS/TEST RESULTS: 04/08 Admission as above 04/08 CT chest: Multifocal severe pneumonia with RUL collapse and soft tissue obstructing RUL bronchus.  04/08 CTAP: Mild small bowel ileus, potential enteritis. Nasogastric tube terminates in distal stomach. Cholelithiasis without CT findings of acute cholecystitis.Consider bronchoscopy.  04/09 nephrology consultation: No acute indication for dialysis at present. 04/09 gastroenterology consultation: Recommend high-dose PPI therapy. 04/10 severely hypoxemic despite 100% FiO2.  Recruitment maneuver with significant improvement.  PEEP increased and ARDS strategy on ventilator implemented.  Neuromuscular blockade implemented due to ventilator dyssynchrony. 04/11 PAF with RVR > converted with amiodarone. Gas exchange improving. Off Nimbex 04/12 Echocardiogram: EF 20-25%.  Diffuse HK. 04/13 High airway pressure with vent dyssynchony; given Vecuronium; FIO2 increased to 50%  04/16 Cardiology consultation 04/13 Amiodarone D/C because of SB 04/15 increased HR, EF 20% afib with RVR 04/17 failed weaning trials, HR remains elevated 04/19 ENT consultation for trach tube placement 04/20 no significant changes in the last 24 hours. Has not tolerated any weaning  04/23 PEG placed. 04/23 intermittent ventilator dyssynchrony - sedation regimen adjusted 04/24 Somewhat more responsive. Not F/C 04/25 Trach tube placed 04/25 significantly more responsive 04/30 and hypoxemia and pulmonary  infiltrates 05/02 fever, leukocytosis 05/03 severe ARDS pattern on CXR 05/03 ID consultation 05/14 goals of care discussion with patient's sister and mother.  Agree to continue aggressive support but DNR in event of cardiac arrest. Received one unit RBCs for Hgb 6.4. For severe ARDS, NMB protocol initiated 05/15 Gas exchange much improved. Remains on NMB. Received one unit RBCs for Hgb 6.9 05/20 high fio2 requirements at 60%, PEEP at 12-plan for diuresis   CC hypoxic resp failure  SUBJECTIVE Remains on high vent support Remains critically ill +fluid balance 27L Needs vent to survive at this time  INDWELLING DEVICES: L femoral CVL 04/08 >> 4/16 ETT 04/08 >> 04/25 RUE PICC 4/15 >>  PEG 04/23 >>  Trach tube 04/25 >>   MICRO DATA: MRSA PCR 04/08, 04/20 >> POS, 05/08 >> NEG Urine 04/08 >> 10k coag negative staph Resp 04/09 >> c/w NOF Blood 04/08 >> NEG Blood 04/09 >> NEG Resp 04/20 >> NOF Urine 05/03 >> NEG  Resp 05/03 >> NOF Blood 05/03 >> 2/2 Klebsiella Blood 05/07 >> NEG Resp 05/13 >> NOF   ANTIMICROBIALS:  Unasyn 04/08 >> 04/10 Vanc 04/08 >> 4/15, 05/03 >> 05/10 Cefepime 04/10 >> 4/15 Metronidazole 04/10 >> 4/15 Meropenem 05/03 >> 05/10 Cefazolin 05/11 >> 05/14 Ceftriaxone 05/14 >> 05/15     VITAL SIGNS: BP (!) 141/65   Pulse 61   Temp 97.7 F (36.5 C) (Axillary)   Resp (!) 21   Ht  (1.778 m)   Wt 254 lb 10.1 oz (115.5 kg)   SpO2 100%   BMI 36.54 kg/m    VENTILATOR SETTINGS: Vent Mode: PCV FiO2 (%):  [35 %-50 %] 35 % Set Rate:  [18 bmp] 18 bmp PEEP:  [12 cmH20] 12 cmH20  INTAKE / OUTPUT: I/O last 3 completed shifts: In: 3803.9 [I.V.:568.9; Other:360; NG/GT:2875] Out: 4265 [Urine:4265]  PHYSICAL EXAMINATION: General: RASS N/A on NMB Neuro: PERRL HEENT: NCAT, sclerae white Cardiovascular: Regular, no M Lungs: No wheezes, few crackles dependently Abdomen: Nontender, + BS GU: minimal scrotal edema Extremities: Symmetric BLE  edema  LABS:  BMET Recent Labs  Lab 02/16/18 2316 02/18/18 0452 02/19/18 0458  NA 144 144 142  K 4.0 4.7 4.6  CL 104 103 98*  CO2 34* 39* 37*  BUN 37* 34* 35*  CREATININE 0.56* 0.61 0.60*  GLUCOSE 206* 187* 206*    Electrolytes Recent Labs  Lab 02/13/18 0443  02/15/18 0538  02/16/18 2316 02/17/18 0513 02/18/18 0452 02/19/18 0458  CALCIUM 8.5*   < > 7.5*   < > 7.9*  --  7.6* 8.2*  MG  --    < > 1.6*  --   --  1.4* 2.2  --   PHOS 4.1  --   --   --   --  2.7  --   --    < > = values in this interval not displayed.    CBC Recent Labs  Lab 02/16/18 0805 02/17/18 0513 02/19/18 0458  WBC 12.4* 9.9 9.5  HGB 8.9* 8.1* 8.7*  HCT 28.1* 25.0* 27.4*  PLT 290 241 226    Coag's No results for input(s): APTT, INR in the last 168 hours.  Sepsis Markers No results for input(s): LATICACIDVEN, PROCALCITON, O2SATVEN in the last 168 hours.  ABG No results for input(s): PHART, PCO2ART, PO2ART in the last 168 hours.  Liver Enzymes Recent Labs  Lab 02/13/18 0443 02/14/18 0632 02/16/18 2316 02/18/18 0452  AST --   ALT --   ALKPHOS 94 84 69  --   BILITOT 0.4 0.5 0.4  --   ALBUMIN 1.7* 1.7* 1.8* 1.8*    Cardiac Enzymes No results for input(s): TROPONINI, PROBNP in the last 168 hours.  Glucose Recent Labs  Lab 02/18/18 1137 02/18/18 1557 02/18/18 1928 02/18/18 2340 02/19/18 0312 02/19/18 0729  GLUCAP 210* 188* 169* 210* 211* 158*    CXR: ARDS pattern    ASSESSMENT / PLAN:  57 yo AAM with h/o TBI admitted 42 days ago for severe resp failure and severe shock and pneumonia complicated by multiorgan failure and severe agitation failure to wean from vent s/p trach with ongoing severe hypoxic resp failure from edema and pneumonia     PULMONARY A: Acute hypoxemic respiratory failure Aspiration pneumonia ARDS. Sputum culture on 5/16 showed a few isolates of Klebsiella, patient is afebrile, will repeat white count and chest x-ray if any  changes and infiltrate will empirically cover  CARDIOVASCULAR A: septic shock, resolved Paroxysmal atrial fibrillation - NSR presently Dilated cardiomyopathy, etiology unclear Hypertension Intermittent sinus tachycardia P:  MAP goal > 65 mmHg BP control with meds as needed  RENAL Furosemide again today  GASTROINTESTINAL SUP: Enteral PPI Continue TF protocol   HEMATOLOGIC DVT px: enoxaparin Monitor CBC intermittently  Transfuse per usual guidelines   INFECTIOUS Klebsiella pneumonia with bacteremia Monitor temp, WBC count Micro and abx as above   ENDOCRINE Continue Lantus - increased 05/14 with initiation of methylpred Continue resistant scale SSI  NEUROLOGIC A:   Ventilator dyssynchrony ICU/ventilator associated discomfort History of schizophrenia History of TBI P:   Psychiatry consult to assist with severe anxiety agitation and delirium prohibiting weaning attempts on mechanical ventilation precedex and seroquel as prescribed  Critical Care Time devoted to patient care services described in this note is 30 minutes.  Overall, patient is critically ill, prognosis is guarded.    Lucie Leather, M.D.  [facility]  Medical Director [facility] Medical Director [facility] Cardio-Pulmonary Department

## 2018-02-19 NOTE — Clinical Social Work Note (Signed)
CSW contacted patient's mother via phone this morning: Tommy Mathews: [phone]. CSW updated patient's mother that patient may be ready for transition to next level of care soon. CSW explained that there is no bed at [facility] in [place] but that [facility] in [place], [place] is interested. CSW informed patient's mother that updated information will be sent to [facility] and that someone from [facility] might be getting in touch with her to apply for medicaid for [place]. She verbalized understanding. York Spaniel MSW,LCSW [phone]

## 2018-02-19 NOTE — Clinical Social Work Note (Signed)
Nurse called CSW this morning wanting to know a disposition now that patient is off of precedex. Patient's precedex was just discontinued this morning. The vent/snf facility will want patient off of precedex for at minimum 24-48 hours. CSW informed nurse of this information. CSW will continue to follow and update accordingly. York Spaniel MSW,LCSW [phone]

## 2018-02-19 NOTE — Progress Notes (Signed)
Patient ID: Tommy Mathews, male   DOB: 11-Aug-1961, 57 y.o.   MRN: [id number]  [employer] PROGRESS NOTE  Tommy Mathews QMG:[id number] DOB: November 28, 1960 DOA: 01/06/2018 PCP: Isaias Cowman, MD  HPI/Subjective: Patient down to 35 % FiO2,.  PEEP 10, communicates some as reported by the ICU staff ,getting tube feeds Glucerna  Objective: Vitals:   02/19/18 1300 02/19/18 1400  BP: 116/62 136/84  Pulse: (!) 104 (!) 109  Resp: (!) 22 (!) 23  Temp:    SpO2: 94% 92%    Filed Weights   02/17/18 0226 02/18/18 0451 02/19/18 0307  Weight: 118.4 kg (261 lb 0.4 oz) 119.1 kg (262 lb 9.1 oz) 115.5 kg (254 lb 10.1 oz)    ROS: Review of Systems  Unable to perform ROS: Acuity of condition   Exam: Physical Exam  Constitutional: He appears lethargic.  HENT:  Nose: No mucosal edema.  Mouth/Throat: No oropharyngeal exudate or posterior oropharyngeal edema.  Tracheostomy site intact  Eyes: Pupils are equal, round, and reactive to light. Conjunctivae and lids are normal.  Neck: No JVD present. Carotid bruit is not present. No edema present. No thyroid mass and no thyromegaly present.  Cardiovascular: S1 normal and S2 normal. Exam reveals no gallop.  No murmur heard. Respiratory: No respiratory distress. He has decreased breath sounds in the right middle field, the right lower field, the left middle field and the left lower field. He has no wheezes. He has rhonchi in the right lower field and the left lower field. He has no rales.  GI: Soft. Bowel sounds are normal. He exhibits distension. There is no tenderness.  Musculoskeletal:       Right wrist: He exhibits swelling.       Left wrist: He exhibits swelling.       Right ankle: He exhibits swelling.       Left ankle: He exhibits swelling.  Lymphadenopathy:    He has no cervical adenopathy.  Neurological: He appears lethargic.  Sedated  Skin: Skin is warm. Nails show no clubbing.  As per nursing staff stage II decubitus on buttock   Psychiatric:  Sedated      Data Reviewed: Basic Metabolic Panel: Recent Labs  Lab 02/13/18 0443 02/14/18 3267 02/15/18 1245 02/16/18 0805 02/16/18 2316 02/17/18 0513 02/18/18 0452 02/19/18 0458  NA 139 143 146* 145 144  --  144 142  K 4.3 3.7 3.3* 3.5 4.0  --  4.7 4.6  CL 94* 103 109 105 104  --  103 98*  CO2 37* 33* 31 34* 34*  --  39* 37*  GLUCOSE 165* 184* 169* 182* 206*  --  187* 206*  BUN 39* 40* 38* 34* 37*  --  34* 35*  CREATININE 0.76 0.56* 0.59* 0.43* 0.56*  --  0.61 0.60*  CALCIUM 8.5* 8.1* 7.5* 7.7* 7.9*  --  7.6* 8.2*  MG  --  1.7 1.6*  --   --  1.4* 2.2 1.9  PHOS 4.1  --   --   --   --  2.7  --  3.9   Liver Function Tests: Recent Labs  Lab 02/13/18 0443 02/14/18 0632 02/16/18 2316 02/18/18 0452  AST _0 --   ALT _1 --   ALKPHOS 94 84 69  --   BILITOT 0.4 0.5 0.4  --   PROT 6.3* 6.0* 5.5*  --   ALBUMIN 1.7* 1.7* 1.8* 1.8*   CBC: Recent Labs  Lab 02/14/18 8099 02/15/18  2878 02/16/18 0805 02/17/18 0513 02/19/18 0458  WBC 13.9* 13.8* 12.4* 9.9 9.5  NEUTROABS  --  11.6* 10.5*  --  7.6*  HGB 8.7* 8.3* 8.9* 8.1* 8.7*  HCT 27.6* 26.6* 28.1* 25.0* 27.4*  MCV 86.9 86.8 87.3 88.0 87.4  PLT 307 329 290 241 226   BNP (last 3 results) Recent Labs    01/06/18 1944 01/16/18 2332 02/12/18 0453  BNP 37.0 402.0* 255.0*     CBG: Recent Labs  Lab 02/18/18 1928 02/18/18 2340 02/19/18 0312 02/19/18 0729 02/19/18 1213  GLUCAP 169* 210* 211* 158* 71    Recent Results (from the past 240 hour(s))  Culture, respiratory (NON-Expectorated)     Status: None   Collection Time: 02/10/18 12:02 PM  Result Value Ref Range Status   Specimen Description   Final    TRACHEAL ASPIRATE Performed at [facility], [address] [place], [place] [address]    Special Requests   Final    NONE Performed at [facility], [address] [place], [place] [address]    Gram Stain   Final    MODERATE WBC PRESENT,BOTH PMN AND  MONONUCLEAR NO ORGANISMS SEEN    Culture   Final    Consistent with normal respiratory flora. Performed at [facility], [place] [address] [place], [place] [address]    Report Status 02/12/2018 FINAL  Final  Culture, respiratory (NON-Expectorated)     Status: None   Collection Time: 02/13/18  1:42 PM  Result Value Ref Range Status   Specimen Description   Final    TRACHEAL ASPIRATE Performed at [facility], [address], [place], [place] [address]    Special Requests   Final    NONE Performed at [facility], [address] [place], [place] [address]    Gram Stain   Final    ABUNDANT WBC PRESENT,BOTH PMN AND MONONUCLEAR RARE YEAST RARE GRAM VARIABLE ROD Performed at [facility], [place] [address] [place], [place] [address]    Culture FEW KLEBSIELLA PNEUMONIAE  Final   Report Status 02/16/2018 FINAL  Final   Organism ID, Bacteria KLEBSIELLA PNEUMONIAE  Final      Susceptibility   Klebsiella pneumoniae - MIC*    AMPICILLIN >=32 RESISTANT Resistant     CEFAZOLIN <=4 SENSITIVE Sensitive     CEFEPIME <=1 SENSITIVE Sensitive     CEFTAZIDIME <=1 SENSITIVE Sensitive     CEFTRIAXONE <=1 SENSITIVE Sensitive     CIPROFLOXACIN <=0.25 SENSITIVE Sensitive     GENTAMICIN <=1 SENSITIVE Sensitive     IMIPENEM <=0.25 SENSITIVE Sensitive     TRIMETH/SULFA <=20 SENSITIVE Sensitive     AMPICILLIN/SULBACTAM 16 INTERMEDIATE Intermediate     PIP/TAZO 16 SENSITIVE Sensitive     Extended ESBL NEGATIVE Sensitive     * FEW KLEBSIELLA PNEUMONIAE     Studies: No results found.  Scheduled Meds: . ALPRAZolam  1.5 mg Oral TID  . budesonide (PULMICORT) nebulizer solution  0.5 mg Nebulization BID  . chlorhexidine gluconate (MEDLINE KIT)  15 mL Mouth Rinse BID  . digoxin  0.25 mg Per Tube Daily  . enoxaparin (LOVENOX) injection  40 mg Subcutaneous Daily  . fentaNYL  100 mcg Transdermal Q72H  . free water  200 mL Per Tube Q8H  . gabapentin  200 mg Per Tube Q8H  .  insulin aspart  0-20 Units Subcutaneous Q4H  . insulin glargine  20 Units Subcutaneous QHS  . ipratropium-albuterol  3  mL Nebulization Q4H  . lisinopril  10 mg Per Tube Daily  . magnesium oxide  400 mg Per Tube BID  . mouth rinse  15 mL Mouth Rinse 10 times per day  . metoprolol tartrate  12.5 mg Per Tube Q6H  . nutrition supplement (JUVEN)  1 packet Per Tube BID  . nystatin  5 mL Oral QID  . pantoprazole sodium  40 mg Per Tube Daily  . QUEtiapine  500 mg Oral BID  . valproic acid  1,000 mg Per Tube BID  . ziprasidone  20 mg Intramuscular BID   Continuous Infusions: . dexmedetomidine (PRECEDEX) IV infusion Stopped (02/19/18 0744)  . feeding supplement (GLUCERNA 1.5 CAL) 65 mL/hr at 02/19/18 1400    Assessment/Plan:  1. Acute hypoxic respiratory failure/asp pna , concerning for ARDS.  Status post tracheostomy    patient made a DNR.  Off Solu-Cortef  2. Ventilator associated pneumonia with sepsis with Klebsiella.  Finished antibiotics.  Flutter device 3. Atrial fibrillation and episodes of SVT.  Currently sinus rhythm heart rate improved on digoxin and metoprolol.  Amiodarone discontinued secondary to worsening respiratory status. 4. Acute on chronic systolic congestive heart failure with anasarca.   On lisinopril and metoprolol. 5. Acute encephalopathy with history of traumatic brain injury. 6. Recent GI bleed on PPI.  Endoscopy negative.  Continue to watch hemoglobin closely.  Received a unit of blood yesterday.  hemoglobin is stable 7. Acute kidney injury.  Has resolved 8. Type 2 diabetes mellitus on glargine insulin and sliding scale 9. Nutrition.  On tube feeds as per intensivist 10. History of traumatic brain injury, depression with severe anxiety-seen by psychiatrist plan is to continue Depakene, Seroquel dose increased to his home dose 500 mg  twice daily 11. Stage II decubitus on buttock-continue wound care  Code Status:     Code Status Orders  (From admission, onward)         Start     Ordered   01/06/18 2227  Full code  Continuous     01/06/18 2226    Code Status History    This patient has a current code status but no historical code status.     Family Communication: As per critical care specialist  disposition Plan: To be determined  Consultants:  Critical care specialist  Infectious disease  Time spent: 23 minutes.  Nicholes Mango  [employer]

## 2018-02-20 LAB — CBC WITH DIFFERENTIAL/PLATELET
BASOS PCT: 1 %
Basophils Absolute: 0.1 10*3/uL (ref 0–0.1)
EOS PCT: 2 %
Eosinophils Absolute: 0.2 10*3/uL (ref 0–0.7)
HEMATOCRIT: 28.7 % — AB (ref 40.0–52.0)
Hemoglobin: 9.2 g/dL — ABNORMAL LOW (ref 13.0–18.0)
LYMPHS ABS: 2 10*3/uL (ref 1.0–3.6)
Lymphocytes Relative: 21 %
MCH: 28.1 pg (ref 26.0–34.0)
MCHC: 32.1 g/dL (ref 32.0–36.0)
MCV: 87.3 fL (ref 80.0–100.0)
MONO ABS: 0.9 10*3/uL (ref 0.2–1.0)
Monocytes Relative: 9 %
Neutro Abs: 6.5 10*3/uL (ref 1.4–6.5)
Neutrophils Relative %: 67 %
PLATELETS: 204 10*3/uL (ref 150–440)
RBC: 3.29 MIL/uL — ABNORMAL LOW (ref 4.40–5.90)
RDW: 20.5 % — AB (ref 11.5–14.5)
WBC: 9.7 10*3/uL (ref 3.8–10.6)

## 2018-02-20 LAB — GLUCOSE, CAPILLARY
GLUCOSE-CAPILLARY: 133 mg/dL — AB (ref 65–99)
GLUCOSE-CAPILLARY: 175 mg/dL — AB (ref 65–99)
Glucose-Capillary: 118 mg/dL — ABNORMAL HIGH (ref 65–99)
Glucose-Capillary: 122 mg/dL — ABNORMAL HIGH (ref 65–99)
Glucose-Capillary: 131 mg/dL — ABNORMAL HIGH (ref 65–99)
Glucose-Capillary: 139 mg/dL — ABNORMAL HIGH (ref 65–99)
Glucose-Capillary: 153 mg/dL — ABNORMAL HIGH (ref 65–99)

## 2018-02-20 MED ORDER — FUROSEMIDE 10 MG/ML IJ SOLN
60.0000 mg | Freq: Once | INTRAMUSCULAR | Status: AC
  Administered 2018-02-20: 60 mg via INTRAVENOUS
  Filled 2018-02-20: qty 6

## 2018-02-20 NOTE — Progress Notes (Signed)
CC follow up hypoxic resp failure  SUBJECTIVE Remains on vent Severe resp distress and agitation given lasix last several days   MAJOR EVENTS/TEST RESULTS: 04/08 Admission as above 04/08 CT chest: Multifocal severe pneumonia with RUL collapse and soft tissue obstructing RUL bronchus.  04/08 CTAP: Mild small bowel ileus, potential enteritis. Nasogastric tube terminates in distal stomach. Cholelithiasis without CT findings of acute cholecystitis.Consider bronchoscopy.  04/09 nephrology consultation: No acute indication for dialysis at present. 04/09 gastroenterology consultation: Recommend high-dose PPI therapy. 04/10 severely hypoxemic despite 100% FiO2.  Recruitment maneuver with significant improvement.  PEEP increased and ARDS strategy on ventilator implemented.  Neuromuscular blockade implemented due to ventilator dyssynchrony. 04/11 PAF with RVR > converted with amiodarone. Gas exchange improving. Off Nimbex 04/12 Echocardiogram: EF 20-25%.  Diffuse HK. 04/13 High airway pressure with vent dyssynchony; given Vecuronium; FIO2 increased to 50%  04/16 Cardiology consultation 04/13 Amiodarone D/C because of SB 04/15 increased HR, EF 20% afib with RVR 04/17 failed weaning trials, HR remains elevated 04/19 ENT consultation for trach tube placement 04/20 no significant changes in the last 24 hours. Has not tolerated any weaning  04/23 PEG placed. 04/23 intermittent ventilator dyssynchrony - sedation regimen adjusted 04/24 Somewhat more responsive. Not F/C 04/25 Trach tube placed 04/25 significantly more responsive 04/30 and hypoxemia and pulmonary infiltrates 05/02 fever, leukocytosis 05/03 severe ARDS pattern on CXR 05/03 ID consultation 05/14 goals of care discussion with patient's sister and mother.  Agree to continue aggressive support but DNR in event of cardiac arrest. Received one unit RBCs for Hgb 6.4. For severe ARDS, NMB protocol initiated 05/15 Gas exchange much improved.  Remains on NMB. Received one unit RBCs for Hgb 6.9 05/20 high fio2 requirements at 60%, PEEP at 12-plan for diuresis    BP (!) 142/81 (BP Location: Left Arm)   Pulse 93   Temp 99.4 F (37.4 C) (Oral)   Resp (!) 21   Ht  (1.778 m)   Wt 257 lb 15 oz (117 kg)   SpO2 90%   BMI 37.01 kg/m   PHYSICAL EXAMINATION:  GENERAL:critically ill appearing, +resp distress HEAD: Normocephalic, atraumatic.  EYES: Pupils equal, round, reactive to light.  No scleral icterus.  MOUTH: Moist mucosal membrane. NECK: Supple. No thyromegaly. No nodules. No JVD.  PULMONARY: +rhonchi,  CARDIOVASCULAR: S1 and S2. Regular rate and rhythm. No murmurs, rubs, or gallops.  GASTROINTESTINAL: Soft, nontender, -distended. No masses. Positive bowel sounds. No hepatosplenomegaly.  MUSCULOSKELETAL: No swelling, clubbing, or edema.  NEUROLOGIC: agitated SKIN:intact,warm,dry   LABS Reviewed 57 yo AAM with h/o TBI admitted 42 days ago for severe resp failure and severe shock and pneumonia complicated by multiorgan failure and severe agitation failure to wean from vent s/p trach with ongoing severe hypoxic resp failure from edema and pneumonia  Severe Hypoxic and Hypercapnic Respiratory Failure-remains on full  Vent support fio2 slowly improving -continue Full MV support -continue Bronchodilator Therapy -Wean Fio2 and PEEP as tolerated Lasix again today-place foley catheter    DVT/GI PRX ordered TRANSFUSIONS AS NEEDED MONITOR FSBS ASSESS the need for LABS as needed   NEUROLOGY meds for severe agitation Avoid infusions if possible    Critical Care Time devoted to patient care services described in this note is 31 minutes.   Overall, patient is critically ill, prognosis is guarded.    Lucie Leather, M.D.  [facility]  Medical Director [facility] Medical Director [facility] Cardio-Pulmonary Department

## 2018-02-20 NOTE — Progress Notes (Signed)
   02/20/18 1540  Clinical Encounter Type  Visited With Patient  Visit Type Follow-up  Spiritual Encounters  Spiritual Needs Prayer   Patient sleeping, chaplain offered silent prayers for patient, family, and care team.

## 2018-02-20 NOTE — Progress Notes (Signed)
Patient ID: Tommy Mathews, male   DOB: June 10, 1961, 57 y.o.   MRN: [id number]  [facility] PROGRESS NOTE  Tommy Mathews WGN:[id number] DOB: 1961/01/21 DOA: 01/06/2018 PCP: Isaias Cowman, MD  HPI/Subjective: Continues to be on trach little better able to speak some  Objective: Vitals:   02/20/18 1543 02/20/18 1600  BP:    Pulse:  100  Resp:  19  Temp:    SpO2: 98% 95%    Filed Weights   02/18/18 0451 02/19/18 0307 02/20/18 0500  Weight: 119.1 kg (262 lb 9.1 oz) 115.5 kg (254 lb 10.1 oz) 117 kg (257 lb 15 oz)    ROS: Review of Systems  Unable to perform ROS: Acuity of condition   Exam: Physical Exam  HENT:  Nose: No mucosal edema.  Mouth/Throat: No oropharyngeal exudate or posterior oropharyngeal edema.  Tracheostomy site intact  Eyes: Pupils are equal, round, and reactive to light. Conjunctivae and lids are normal.  Neck: No JVD present. Carotid bruit is not present. No edema present. No thyroid mass and no thyromegaly present.  Cardiovascular: S1 normal and S2 normal. Exam reveals no gallop.  No murmur heard. Respiratory: No respiratory distress. He has decreased breath sounds in the right middle field, the right lower field, the left middle field and the left lower field. He has no wheezes. He has rhonchi in the right lower field and the left lower field. He has no rales.  GI: Soft. Bowel sounds are normal. He exhibits distension. There is no tenderness.  Musculoskeletal:       Right wrist: He exhibits swelling.       Left wrist: He exhibits swelling.       Right ankle: He exhibits swelling.       Left ankle: He exhibits swelling.  Lymphadenopathy:    He has no cervical adenopathy.  Neurological:  Awake  Skin: Skin is warm. Nails show no clubbing.  As per nursing staff stage II decubitus on buttock      Data Reviewed: Basic Metabolic Panel: Recent Labs  Lab 02/14/18 0632 02/15/18 0538 02/16/18 0805 02/16/18 2316 02/17/18 0513 02/18/18 0452  02/19/18 0458  NA 143 146* 145 144  --  144 142  K 3.7 3.3* 3.5 4.0  --  4.7 4.6  CL 103 109 105 104  --  103 98*  CO2 33* 31 34* 34*  --  39* 37*  GLUCOSE 184* 169* 182* 206*  --  187* 206*  BUN 40* 38* 34* 37*  --  34* 35*  CREATININE 0.56* 0.59* 0.43* 0.56*  --  0.61 0.60*  CALCIUM 8.1* 7.5* 7.7* 7.9*  --  7.6* 8.2*  MG 1.7 1.6*  --   --  1.4* 2.2 1.9  PHOS  --   --   --   --  2.7  --  3.9   Liver Function Tests: Recent Labs  Lab 02/14/18 0632 02/16/18 2316 02/18/18 0452  AST 31 20  --   ALT 28 19  --   ALKPHOS 84 69  --   BILITOT 0.5 0.4  --   PROT 6.0* 5.5*  --   ALBUMIN 1.7* 1.8* 1.8*   CBC: Recent Labs  Lab 02/15/18 0538 02/16/18 0805 02/17/18 0513 02/19/18 0458 02/20/18 0427  WBC 13.8* 12.4* 9.9 9.5 9.7  NEUTROABS 11.6* 10.5*  --  7.6* 6.5  HGB 8.3* 8.9* 8.1* 8.7* 9.2*  HCT 26.6* 28.1* 25.0* 27.4* 28.7*  MCV 86.8 87.3 88.0 87.4 87.3  PLT 329 290  241 226 204   BNP (last 3 results) Recent Labs    01/06/18 1944 01/16/18 2332 02/12/18 0453  BNP 37.0 402.0* 255.0*     CBG: Recent Labs  Lab 02/20/18 0013 02/20/18 0353 02/20/18 0758 02/20/18 1301 02/20/18 1558  GLUCAP 139* 118* 122* 131* 133*    Recent Results (from the past 240 hour(s))  Culture, respiratory (NON-Expectorated)     Status: None   Collection Time: 02/13/18  1:42 PM  Result Value Ref Range Status   Specimen Description   Final    TRACHEAL ASPIRATE Performed at [facility], [address], [place], [place] [address]    Special Requests   Final    NONE Performed at [facility], [address] [place], [place] [address]    Gram Stain   Final    ABUNDANT WBC PRESENT,BOTH PMN AND MONONUCLEAR RARE YEAST RARE GRAM VARIABLE ROD Performed at [facility], [place] [address] [place],  [address]    Culture FEW KLEBSIELLA PNEUMONIAE  Final   Report Status 02/16/2018 FINAL  Final   Organism ID, Bacteria KLEBSIELLA PNEUMONIAE  Final       Susceptibility   Klebsiella pneumoniae - MIC*    AMPICILLIN >=32 RESISTANT Resistant     CEFAZOLIN <=4 SENSITIVE Sensitive     CEFEPIME <=1 SENSITIVE Sensitive     CEFTAZIDIME <=1 SENSITIVE Sensitive     CEFTRIAXONE <=1 SENSITIVE Sensitive     CIPROFLOXACIN <=0.25 SENSITIVE Sensitive     GENTAMICIN <=1 SENSITIVE Sensitive     IMIPENEM <=0.25 SENSITIVE Sensitive     TRIMETH/SULFA <=20 SENSITIVE Sensitive     AMPICILLIN/SULBACTAM 16 INTERMEDIATE Intermediate     PIP/TAZO 16 SENSITIVE Sensitive     Extended ESBL NEGATIVE Sensitive     * FEW KLEBSIELLA PNEUMONIAE     Studies: No results found.  Scheduled Meds: . ALPRAZolam  1.5 mg Oral TID  . budesonide (PULMICORT) nebulizer solution  0.5 mg Nebulization BID  . chlorhexidine gluconate (MEDLINE KIT)  15 mL Mouth Rinse BID  . digoxin  0.25 mg Per Tube Daily  . enoxaparin (LOVENOX) injection  40 mg Subcutaneous Daily  . fentaNYL  100 mcg Transdermal Q72H  . free water  200 mL Per Tube Q8H  . gabapentin  200 mg Per Tube Q8H  . insulin aspart  0-20 Units Subcutaneous Q4H  . insulin glargine  20 Units Subcutaneous QHS  . ipratropium-albuterol  3 mL Nebulization Q4H  . lisinopril  10 mg Per Tube Daily  . magnesium oxide  400 mg Per Tube BID  . mouth rinse  15 mL Mouth Rinse 10 times per day  . metoprolol tartrate  12.5 mg Per Tube Q6H  . nutrition supplement (JUVEN)  1 packet Per Tube BID  . nystatin  5 mL Oral QID  . pantoprazole sodium  40 mg Per Tube Daily  . QUEtiapine  500 mg Oral BID  . valproic acid  1,000 mg Per Tube BID  . ziprasidone  20 mg Intramuscular BID   Continuous Infusions: . dexmedetomidine (PRECEDEX) IV infusion Stopped (02/19/18 0744)  . feeding supplement (GLUCERNA 1.5 CAL) 65 mL/hr at 02/20/18 0700    Assessment/Plan:  1. Acute hypoxic respiratory failure/asp pna , concerning for ARDS.  Status post tracheostomy    continue current care per the intensive 2. Ventilator associated pneumonia with sepsis with  Klebsiella.  Finished antibiotics.  Flutter device 3. Atrial fibrillation and episodes of SVT.  Currently sinus rhythm heart  rate improved on digoxin and metoprolol.  Amiodarone discontinued secondary to worsening respiratory status. 4. Acute on chronic systolic congestive heart failure with anasarca.  Continue on lisinopril and metoprolol. 5. Acute encephalopathy with history of traumatic brain injury. 6. Recent GI bleed on PPI.  Endoscopy negative.  Continue to watch hemoglobin closely.  Received a unit of blood yesterday.  hemoglobin is stable 7. Acute kidney injury.  Has resolved 8. Type 2 diabetes mellitus on glargine insulin and sliding scale 9. Nutrition.  On tube feeds as per intensivist 10. History of traumatic brain injury, depression with severe anxiety-seen by psychiatrist plan is to continue Depakene, Seroquel dose increased to his home dose 500 mg  twice daily 11. Stage II decubitus on buttock-continue wound care  Code Status:     Code Status Orders  (From admission, onward)        Start     Ordered   01/06/18 2227  Full code  Continuous     01/06/18 2226    Code Status History    This patient has a current code status but no historical code status.     Family Communication: As per critical care specialist  disposition Plan: To be determined  Consultants:  Critical care specialist  Infectious disease  Time spent: 23 minutes.  Tommy Mathews [facility]

## 2018-02-21 ENCOUNTER — Inpatient Hospital Stay: Payer: Medicaid Other

## 2018-02-21 DIAGNOSIS — Z9911 Dependence on respirator [ventilator] status: Secondary | ICD-10-CM

## 2018-02-21 DIAGNOSIS — J810 Acute pulmonary edema: Secondary | ICD-10-CM

## 2018-02-21 LAB — BLOOD GAS, ARTERIAL
Acid-Base Excess: 23.1 mmol/L — ABNORMAL HIGH (ref 0.0–2.0)
Bicarbonate: 50.3 mmol/L — ABNORMAL HIGH (ref 20.0–28.0)
FIO2: 1
LHR: 28 {breaths}/min
O2 SAT: 94.5 %
PEEP/CPAP: 15 cmH2O
Patient temperature: 37
Pressure control: 30 cmH2O
pCO2 arterial: 63 mmHg — ABNORMAL HIGH (ref 32.0–48.0)
pH, Arterial: 7.51 — ABNORMAL HIGH (ref 7.350–7.450)
pO2, Arterial: 66 mmHg — ABNORMAL LOW (ref 83.0–108.0)

## 2018-02-21 LAB — BASIC METABOLIC PANEL
ANION GAP: 5 (ref 5–15)
BUN: 32 mg/dL — ABNORMAL HIGH (ref 6–20)
CALCIUM: 8.3 mg/dL — AB (ref 8.9–10.3)
CO2: 41 mmol/L — ABNORMAL HIGH (ref 22–32)
Chloride: 93 mmol/L — ABNORMAL LOW (ref 101–111)
Creatinine, Ser: 0.68 mg/dL (ref 0.61–1.24)
GLUCOSE: 135 mg/dL — AB (ref 65–99)
POTASSIUM: 4.4 mmol/L (ref 3.5–5.1)
Sodium: 139 mmol/L (ref 135–145)

## 2018-02-21 LAB — GLUCOSE, CAPILLARY
GLUCOSE-CAPILLARY: 122 mg/dL — AB (ref 65–99)
Glucose-Capillary: 142 mg/dL — ABNORMAL HIGH (ref 65–99)
Glucose-Capillary: 136 mg/dL — ABNORMAL HIGH (ref 65–99)
Glucose-Capillary: 153 mg/dL — ABNORMAL HIGH (ref 65–99)
Glucose-Capillary: 142 mg/dL — ABNORMAL HIGH (ref 65–99)
Glucose-Capillary: 113 mg/dL — ABNORMAL HIGH (ref 65–99)

## 2018-02-21 MED ORDER — FREE WATER
150.0000 mL | Freq: Three times a day (TID) | Status: DC
  Administered 2018-02-21 – 2018-03-17 (×72): 150 mL

## 2018-02-21 NOTE — Progress Notes (Signed)
[facility] - [place] at [facility]   PATIENT NAME: Tommy Mathews    MR#:  [id number]  DATE OF BIRTH:  May 15, 1961  SUBJECTIVE:   Patient has a trach and remains on ventilator  REVIEW OF SYSTEMS:  Unable to obtain Tolerating Diet: yes       DRUG ALLERGIES:  No Known Allergies  VITALS:  Blood pressure 126/70, pulse 91, temperature 98.8 F (37.1 C), temperature source Oral, resp. rate 20, height  (1.778 m), weight 117 kg (257 lb 15 oz), SpO2 100 %.  PHYSICAL EXAMINATION:  Constitutional: Appears well-developed and well-nourished. No distress. HENT: Normocephalic. Marland Kitchen Oropharynx is clear and moist.  Tracheostomy site is intact Eyes: Conjunctivae and EOM are normal. PERRLA, no scleral icterus.  Neck: Normal ROM. Neck supple. No JVD. No tracheal deviation. CVS: RRR, S1/S2 +, no murmurs, no gallops, no carotid bruit.  Pulmonary: Coarse breath sounds throughout lung fields Abdominal: Soft. BS +,  no distension, tenderness, rebound or guarding.  Musculoskeletal: Normal range of motion.  1+ lower extremity edema and no tenderness.  Neuro: Alert. CN 2-12 grossly intact. No focal deficits. Skin: Stage II decubitus ulcer buttock psychiatric: Normal mood and affect.      LABORATORY PANEL:   CBC Recent Labs  Lab 02/20/18 0427  WBC 9.7  HGB 9.2*  HCT 28.7*  PLT 204   ------------------------------------------------------------------------------------------------------------------  Chemistries  Recent Labs  Lab 02/16/18 2316  02/19/18 0458 02/21/18 0407  NA 144   < > 142 139  K 4.0   < > 4.6 4.4  CL 104   < > 98* 93*  CO2 34*   < > 37* 41*  GLUCOSE 206*   < > 206* 135*  BUN 37*   < > 35* 32*  CREATININE 0.56*   < > 0.60* 0.68  CALCIUM 7.9*   < > 8.2* 8.3*  MG  --    < > 1.9  --   AST 20  --   --   --   ALT 19  --   --   --   ALKPHOS 69  --   --   --   BILITOT 0.4  --   --   --    < > = values in this interval not displayed.    ------------------------------------------------------------------------------------------------------------------  Cardiac Enzymes No results for input(s): TROPONINI in the last 168 hours. ------------------------------------------------------------------------------------------------------------------  RADIOLOGY:  Dg Chest Port 1 View  Result Date: 02/21/2018 CLINICAL DATA:  Respiratory failure EXAM: PORTABLE CHEST 1 VIEW COMPARISON:  02/13/2018, 02/12/2018, 02/10/2018 FINDINGS: Tracheostomy tube tip about 5 cm superior to carina. Right upper extremity catheter tip overlies the cavoatrial region. Mild cardiomegaly. Diffuse pulmonary edema or infiltrates, slightly improved. No large effusion. No pneumothorax. IMPRESSION: 1. Support lines and tubes as above 2. Mild to moderate diffuse pulmonary edema or infiltrate, slightly improved since most recent prior. Electronically Signed   By: Jasmine Pang M.D.   On: 02/21/2018 01:24     ASSESSMENT AND PLAN:   57 year old male with history of TBI admitted for severe respiratory failure and severe shock with pneumonia.  1.  Severe hypoxic and hypercapnic respiratory failure: Patient remains on vent support via tracheostomy Management as per intensivist  2.  Ventilator associated pneumonia with sepsis and Klebsiella: Patient has completed antibiotics  3.  PAF: Patient is currently in sinus rhythm Continue digoxin and metoprolol Amiodarone discontinued due to worsening of respiratory status  4.  Acute on chronic systolic heart failure: Continue  Lasix as needed, lisinopril and metoprolol  5.  TBI/depression: Patient evaluated by psychiatry Continue valproic acid and Geodon 6.  Recent GI bleed on PPI with negative endoscopy Monitor hemoglobin  7.  Acute kidney injury in the setting of sepsis which is resolved  8.  Diabetes: Continue Lantus with sliding scale  9.  Stage II decubitus buttock ulcer: Continue wound care       CODE  STATUS: DNR  TOTAL TIME TAKING CARE OF THIS PATIENT: 22 minutes.     POSSIBLE D/C ??, DEPENDING ON CLINICAL CONDITION.   Girolamo Lortie M.D on 02/21/2018 at 9:04 AM  Between 7am to 6pm - Pager - [phone] After 6pm go to [email or web address] - password [email or web address]  [facility]  Office  [phone]  CC: Primary care physician; Marcina Millard, MD  Note: This dictation was prepared with Dragon dictation along with smaller phrase technology. Any transcriptional errors that result from this process are unintentional.

## 2018-02-21 NOTE — Progress Notes (Signed)
Pharmacy Electrolyte Monitoring Consult:  Pharmacy consulted to assist in monitoring and replacing electrolytes in this 57 y.o. male admitted on 01/06/2018 with Respiratory Distress Patient receiving Magnesium oxide 400 mg PO BID.  Labs:  Sodium (mmol/L)  Date Value  02/21/2018 139   Potassium (mmol/L)  Date Value  02/21/2018 4.4   Magnesium (mg/dL)  Date Value  16/07/9603 1.9   Phosphorus (mg/dL)  Date Value  54/06/8118 3.9   Calcium (mg/dL)  Date Value  14/78/2956 8.3 (L)   Albumin (g/dL)  Date Value  21/30/8657 1.8 (L)   Corrected Calcium: 10.6  Assessment/Plan: No replacement warranted. Will recheck electrolytes with am labs.   Pharmacy will continue to monitor and adjust per consult.   Mirna Mires 02/21/2018 2:20 PM

## 2018-02-21 NOTE — Clinical Social Work Note (Signed)
CSW faxed more updated information to Boneta Lucks at [facility] and Boneta Lucks will be coordinating with patient's mother to complete the [facility] [place] paperwork which will have to be completed and mother will have to turn in all requested documents prior to patient transferring to [facility]. York Spaniel MSW,LcSW [phone]

## 2018-02-21 NOTE — Progress Notes (Signed)
CC follow up hypoxic resp failure  SUBJECTIVE Remains on vent Severe resp distress and agitation given lasix last several days   MAJOR EVENTS/TEST RESULTS: 04/08 Admission as above 04/08 CT chest: Multifocal severe pneumonia with RUL collapse and soft tissue obstructing RUL bronchus.  04/08 CTAP: Mild small bowel ileus, potential enteritis. Nasogastric tube terminates in distal stomach. Cholelithiasis without CT findings of acute cholecystitis.Consider bronchoscopy.  04/09 nephrology consultation: No acute indication for dialysis at present. 04/09 gastroenterology consultation: Recommend high-dose PPI therapy. 04/10 severely hypoxemic despite 100% FiO2.  Recruitment maneuver with significant improvement.  PEEP increased and ARDS strategy on ventilator implemented.  Neuromuscular blockade implemented due to ventilator dyssynchrony. 04/11 PAF with RVR > converted with amiodarone. Gas exchange improving. Off Nimbex 04/12 Echocardiogram: EF 20-25%.  Diffuse HK. 04/13 High airway pressure with vent dyssynchony; given Vecuronium; FIO2 increased to 50%  04/16 Cardiology consultation 04/13 Amiodarone D/C because of SB 04/15 increased HR, EF 20% afib with RVR 04/17 failed weaning trials, HR remains elevated 04/19 ENT consultation for trach tube placement 04/20 no significant changes in the last 24 hours. Has not tolerated any weaning  04/23 PEG placed. 04/23 intermittent ventilator dyssynchrony - sedation regimen adjusted 04/24 Somewhat more responsive. Not F/C 04/25 Trach tube placed 04/25 significantly more responsive 04/30 and hypoxemia and pulmonary infiltrates 05/02 fever, leukocytosis 05/03 severe ARDS pattern on CXR 05/03 ID consultation 05/14 goals of care discussion with patient's sister and mother.  Agree to continue aggressive support but DNR in event of cardiac arrest. Received one unit RBCs for Hgb 6.4. For severe ARDS, NMB protocol initiated 05/15 Gas exchange much improved.  Remains on NMB. Received one unit RBCs for Hgb 6.9 05/20 high fio2 requirements at 60%, PEEP at 12-plan for diuresis    BP 126/70   Pulse 91   Temp 98.8 F (37.1 C) (Oral)   Resp 20   Ht  (1.778 m)   Wt 257 lb 15 oz (117 kg)   SpO2 93%   BMI 37.01 kg/m   PHYSICAL EXAMINATION:  GENERAL:comfortable on vent today HEAD: Normocephalic, atraumatic.  EYES: Pupils equal, round, reactive to light.  No scleral icterus.  MOUTH: Moist mucosal membrane. NECK: Supple. No thyromegaly. No nodules. No JVD.  PULMONARY: +rhonchi,  CARDIOVASCULAR: S1 and S2. Regular rate and rhythm. No murmurs, rubs, or gallops.  GASTROINTESTINAL: Soft, nontender, -distended. No masses. Positive bowel sounds. No hepatosplenomegaly.  MUSCULOSKELETAL: No swelling, clubbing, or edema.  NEUROLOGIC: followed simple commands SKIN:intact,warm,dry   LABS Reviewed 57 yo AAM with h/o TBI admitted 42 days ago for severe resp failure and severe shock and pneumonia complicated by multiorgan failure and severe agitation failure to wean from vent s/p trach with ongoing severe hypoxic resp failure from edema and pneumonia- radiographically and clinically better today  Severe Hypoxic and Hypercapnic Respiratory Failure-remains on full  Vent dependent/ pulmonary oedema fio2 slowly improving -continue Full MV support -continue Bronchodilator Therapy -Wean Fio2 and PEEP as tolerated - decrease free water to 150 ml/8hr in order to maintain euvolemia    DVT/GI PRX ordered TRANSFUSIONS AS NEEDED MONITOR FSBS ASSESS the need for LABS as needed   NEUROLOGY meds for severe agitation Avoid infusions if possible    Critical Care Time devoted to patient care services described in this note is 31 minutes.   Overall, patient is critically ill, prognosis is guarded.    Jackson Latino, M.D.  [facility]

## 2018-02-22 ENCOUNTER — Inpatient Hospital Stay: Payer: Medicaid Other

## 2018-02-22 DIAGNOSIS — R400 Somnolence: Secondary | ICD-10-CM

## 2018-02-22 DIAGNOSIS — J9602 Acute respiratory failure with hypercapnia: Secondary | ICD-10-CM

## 2018-02-22 LAB — GLUCOSE, CAPILLARY
GLUCOSE-CAPILLARY: 139 mg/dL — AB (ref 65–99)
GLUCOSE-CAPILLARY: 162 mg/dL — AB (ref 65–99)
GLUCOSE-CAPILLARY: 156 mg/dL — AB (ref 65–99)
GLUCOSE-CAPILLARY: 114 mg/dL — AB (ref 65–99)
Glucose-Capillary: 124 mg/dL — ABNORMAL HIGH (ref 65–99)
Glucose-Capillary: 110 mg/dL — ABNORMAL HIGH (ref 65–99)

## 2018-02-22 LAB — CBC WITH DIFFERENTIAL/PLATELET
BAND NEUTROPHILS: 0 %
BLASTS: 0 %
Basophils Absolute: 0 10*3/uL (ref 0–0.1)
Basophils Relative: 0 %
EOS ABS: 0.2 10*3/uL (ref 0–0.7)
Eosinophils Relative: 3 %
HCT: 28.7 % — ABNORMAL LOW (ref 40.0–52.0)
Hemoglobin: 9 g/dL — ABNORMAL LOW (ref 13.0–18.0)
Lymphocytes Relative: 23 %
Lymphs Abs: 1.8 10*3/uL (ref 1.0–3.6)
MCH: 27.5 pg (ref 26.0–34.0)
MCHC: 31.3 g/dL — ABNORMAL LOW (ref 32.0–36.0)
MCV: 87.8 fL (ref 80.0–100.0)
MONOS PCT: 8 %
Metamyelocytes Relative: 2 %
Monocytes Absolute: 0.6 10*3/uL (ref 0.2–1.0)
Myelocytes: 0 %
NEUTROS PCT: 62 %
NRBC: 0 /100{WBCs}
Neutro Abs: 5 10*3/uL (ref 1.4–6.5)
OTHER: 2 %
PLATELETS: 173 10*3/uL (ref 150–440)
PROMYELOCYTES RELATIVE: 0 %
RBC: 3.27 MIL/uL — ABNORMAL LOW (ref 4.40–5.90)
RDW: 20.3 % — ABNORMAL HIGH (ref 11.5–14.5)
SMEAR REVIEW: ADEQUATE
WBC: 7.8 10*3/uL (ref 3.8–10.6)

## 2018-02-22 LAB — BASIC METABOLIC PANEL
Anion gap: 3 — ABNORMAL LOW (ref 5–15)
BUN: 22 mg/dL — ABNORMAL HIGH (ref 6–20)
CHLORIDE: 94 mmol/L — AB (ref 101–111)
CO2: 40 mmol/L — ABNORMAL HIGH (ref 22–32)
CREATININE: 0.73 mg/dL (ref 0.61–1.24)
Calcium: 8.2 mg/dL — ABNORMAL LOW (ref 8.9–10.3)
Glucose, Bld: 143 mg/dL — ABNORMAL HIGH (ref 65–99)
POTASSIUM: 4.3 mmol/L (ref 3.5–5.1)
SODIUM: 137 mmol/L (ref 135–145)

## 2018-02-22 LAB — AMMONIA: AMMONIA: 37 umol/L — AB (ref 9–35)

## 2018-02-22 LAB — LIPASE, BLOOD: Lipase: 24 U/L (ref 11–51)

## 2018-02-22 MED ORDER — HYDRALAZINE HCL 20 MG/ML IJ SOLN: 10.0000 mg | INTRAMUSCULAR | Status: DC | PRN

## 2018-02-22 NOTE — Progress Notes (Signed)
CC follow up hypoxic resp failure  SUBJECTIVE Remains on vent. Has become more somnolent over the course of today.   MAJOR EVENTS/TEST RESULTS: 04/08 Admission as above 04/08 CT chest: Multifocal severe pneumonia with RUL collapse and soft tissue obstructing RUL bronchus.  04/08 CTAP: Mild small bowel ileus, potential enteritis. Nasogastric tube terminates in distal stomach. Cholelithiasis without CT findings of acute cholecystitis.Consider bronchoscopy.  04/09 nephrology consultation: No acute indication for dialysis at present. 04/09 gastroenterology consultation: Recommend high-dose PPI therapy. 04/10 severely hypoxemic despite 100% FiO2.  Recruitment maneuver with significant improvement.  PEEP increased and ARDS strategy on ventilator implemented.  Neuromuscular blockade implemented due to ventilator dyssynchrony. 04/11 PAF with RVR > converted with amiodarone. Gas exchange improving. Off Nimbex 04/12 Echocardiogram: EF 20-25%.  Diffuse HK. 04/13 High airway pressure with vent dyssynchony; given Vecuronium; FIO2 increased to 50%  04/16 Cardiology consultation 04/13 Amiodarone D/C because of SB 04/15 increased HR, EF 20% afib with RVR 04/17 failed weaning trials, HR remains elevated 04/19 ENT consultation for trach tube placement 04/20 no significant changes in the last 24 hours. Has not tolerated any weaning  04/23 PEG placed. 04/23 intermittent ventilator dyssynchrony - sedation regimen adjusted 04/24 Somewhat more responsive. Not F/C 04/25 Trach tube placed 04/25 significantly more responsive 04/30 and hypoxemia and pulmonary infiltrates 05/02 fever, leukocytosis 05/03 severe ARDS pattern on CXR 05/03 ID consultation 05/14 goals of care discussion with patient's sister and mother.  Agree to continue aggressive support but DNR in event of cardiac arrest. Received one unit RBCs for Hgb 6.4. For severe ARDS, NMB protocol initiated 05/15 Gas exchange much improved. Remains on NMB.  Received one unit RBCs for Hgb 6.9 05/20 high fio2 requirements at 60%, PEEP at 12-plan for diuresis    BP 115/61   Pulse 82   Temp 99.4 F (37.4 C) (Axillary)   Resp 19   Ht  (1.778 m)   Wt 246 lb 4.1 oz (111.7 kg)   SpO2 94%   BMI 35.33 kg/m   PHYSICAL EXAMINATION:  GENERAL:comfortable on vent today HEAD: Normocephalic, atraumatic.  EYES: Pupils equal, round, reactive to light.  No scleral icterus.  MOUTH: Moist mucosal membrane. NECK: Supple. No thyromegaly. No nodules. No JVD.  PULMONARY: +rhonchi,  CARDIOVASCULAR: S1 and S2. Regular rate and rhythm. No murmurs, rubs, or gallops.  GASTROINTESTINAL:Soft,nontender,distended.No masses. Positive bowel sounds. No hepatosplenomegaly.  MUSCULOSKELETAL: No swelling, clubbing, or edema.  NEUROLOGIC: followed simple commands this AM. This afternoon extremely lethargic, ocasional eye opening but followed no commands SKIN:intact,warm,dry   LABS Reviewed 57 yo AAM with h/o TBI admitted 42 days ago for severe resp failure and severe shock and pneumonia complicated by multiorgan failure and severe agitation failure to wean from vent s/p trach with ongoing severe hypoxic resp failure from edema and pneumonia- radiographically and clinically improved.  However mentally depressed.   NEUROLOGY Altered mental status R/ O etiology - ABG continues to show permissive hypercapnia close to base line - Will get CT head - Will D/C Geodon and check Valproic level - Check Ammonia level - will continue to avoid sedative infusion   Severe Hypoxic and Hypercapnic Respiratory Failure-remains on full  Vent dependent/ pulmonary oedema fio2 slowly improving -continue Full MV support -continue Bronchodilator Therapy -Wean Fio2 and PEEP as tolerated - decrease free water to 150 ml/8hr in order to maintain euvolemia    DVT/GI PRX ordered TRANSFUSIONS AS NEEDED MONITOR FSBS ASSESS the need for LABS as needed   Update: Wife and daughter  updated.   Critical Care Time devoted to patient care services described in this note is 37 minutes.   Overall, patient is critically ill, prognosis is guarded.    Jackson Latino, M.D.  [facility]

## 2018-02-22 NOTE — Plan of Care (Signed)
Patient continues on vent at this time.  No acute distress noted.  Patient alert and able to communicate by mouthing words.  Tolerated meds. Will continue to monitor.

## 2018-02-22 NOTE — Progress Notes (Signed)
[employer] - [place] at [facility]   PATIENT NAME: Tommy Mathews    MR#:  [id number]  DATE OF BIRTH:  1961/06/17  SUBJECTIVE:   Patient receiving chest PT currently.  No acute events overnight.  Remains on vent  REVIEW OF SYSTEMS:  Unable to obtain Tolerating Diet: yes       DRUG ALLERGIES:  No Known Allergies  VITALS:  Blood pressure (!) 116/59, pulse 91, temperature 99.1 F (37.3 C), temperature source Oral, resp. rate 15, height  (1.778 m), weight 111.7 kg (246 lb 4.1 oz), SpO2 96 %.  PHYSICAL EXAMINATION:  Constitutional: Appears well-developed and well-nourished. No distress. HENT: Normocephalic. Marland Kitchen Oropharynx is clear and moist.  Tracheostomy site is intact Eyes: Conjunctivae and EOM are normal. PERRLA, no scleral icterus.  Neck: Normal ROM. Neck supple. No JVD. No tracheal deviation. CVS: RRR, S1/S2 +, no murmurs, no gallops, no carotid bruit.  Pulmonary: Coarse breath sounds throughout lung fields Abdominal: Soft. BS +,  no distension, tenderness, rebound or guarding.  Musculoskeletal: Normal range of motion.  No lower extremity edema and no tenderness.  Neuro: Alert. CN 2-12 grossly intact. No focal deficits. Skin: Stage II decubitus ulcer buttock psychiatric: Normal mood and affect.      LABORATORY PANEL:   CBC Recent Labs  Lab 02/20/18 0427  WBC 9.7  HGB 9.2*  HCT 28.7*  PLT 204   ------------------------------------------------------------------------------------------------------------------  Chemistries  Recent Labs  Lab 02/16/18 2316  02/19/18 0458  02/22/18 0622  NA 144   < > 142   < > 137  K 4.0   < > 4.6   < > 4.3  CL 104   < > 98*   < > 94*  CO2 34*   < > 37*   < > 40*  GLUCOSE 206*   < > 206*   < > 143*  BUN 37*   < > 35*   < > 22*  CREATININE 0.56*   < > 0.60*   < > 0.73  CALCIUM 7.9*   < > 8.2*   < > 8.2*  MG  --    < > 1.9  --   --   AST 20  --   --   --   --   ALT 19  --   --   --   --   ALKPHOS 69   --   --   --   --   BILITOT 0.4  --   --   --   --    < > = values in this interval not displayed.   ------------------------------------------------------------------------------------------------------------------  Cardiac Enzymes No results for input(s): TROPONINI in the last 168 hours. ------------------------------------------------------------------------------------------------------------------  RADIOLOGY:  Dg Chest Port 1 View  Result Date: 02/21/2018 CLINICAL DATA:  Respiratory failure EXAM: PORTABLE CHEST 1 VIEW COMPARISON:  02/13/2018, 02/12/2018, 02/10/2018 FINDINGS: Tracheostomy tube tip about 5 cm superior to carina. Right upper extremity catheter tip overlies the cavoatrial region. Mild cardiomegaly. Diffuse pulmonary edema or infiltrates, slightly improved. No large effusion. No pneumothorax. IMPRESSION: 1. Support lines and tubes as above 2. Mild to moderate diffuse pulmonary edema or infiltrate, slightly improved since most recent prior. Electronically Signed   By: Jasmine Pang M.D.   On: 02/21/2018 01:24     ASSESSMENT AND PLAN:   57 year old male with history of TBI admitted for severe respiratory failure and severe shock with pneumonia.  1.  Severe hypoxic and hypercapnic respiratory failure: Patient remains  on vent support via tracheostomy Management as per intensivist  2.  Ventilator associated pneumonia with sepsis and Klebsiella: Patient has completed antibiotics  3.  PAF: Patient is currently in sinus rhythm Continue digoxin and metoprolol Amiodarone discontinued due to worsening of respiratory status  4.  Acute on chronic systolic heart failure: Currently euvolemic  continue Lasix as needed, lisinopril and metoprolol  5.  TBI/depression: Patient evaluated by psychiatry Continue valproic acid and Geodon 6.  Recent GI bleed on PPI with negative endoscopy Monitor hemoglobin  7.  Acute kidney injury in the setting of sepsis which is resolved  8.   Diabetes: Continue Lantus with sliding scale  9.  Stage II decubitus buttock ulcer: Continue wound care    Discussed with intensivist  Awaiting placement   CODE STATUS: DNR  TOTAL TIME TAKING CARE OF THIS PATIENT: 22 minutes.     POSSIBLE D/C ??, DEPENDING ON CLINICAL CONDITION.   Nissi Doffing M.D on 02/22/2018 at 8:37 AM  Between 7am to 6pm - Pager - [phone] After 6pm go to [email or web address] - password [email or web address]  [facility] [place] [facility]  Office  [phone]  CC: Primary care physician; Marcina Millard, MD  Note: This dictation was prepared with Dragon dictation along with smaller phrase technology. Any transcriptional errors that result from this process are unintentional.

## 2018-02-22 NOTE — Progress Notes (Signed)
Pharmacy Electrolyte Monitoring Consult:  Pharmacy consulted to assist in monitoring and replacing electrolytes in this 57 y.o. male admitted on 01/06/2018 with Respiratory Distress Patient receiving Magnesium oxide 400 mg PO BID.  Labs:  Sodium (mmol/L)  Date Value  02/22/2018 137   Potassium (mmol/L)  Date Value  02/22/2018 4.3   Magnesium (mg/dL)  Date Value  40/98/1191 1.9   Phosphorus (mg/dL)  Date Value  47/82/9562 3.9   Calcium (mg/dL)  Date Value  13/05/6577 8.2 (L)   Albumin (g/dL)  Date Value  46/96/2952 1.8 (L)   Corrected Calcium: 10.6  Assessment/Plan: No replacement warranted. Will recheck electrolytes with am labs.   Pharmacy will continue to monitor and adjust per consult.   Stormy Card, [facility] 02/22/2018 9:55 AM

## 2018-02-23 DIAGNOSIS — G9341 Metabolic encephalopathy: Secondary | ICD-10-CM

## 2018-02-23 LAB — VALPROIC ACID LEVEL: VALPROIC ACID LVL: 66 ug/mL (ref 50.0–100.0)

## 2018-02-23 LAB — BASIC METABOLIC PANEL
Anion gap: 2 — ABNORMAL LOW (ref 5–15)
BUN: 20 mg/dL (ref 6–20)
CO2: 41 mmol/L — ABNORMAL HIGH (ref 22–32)
Calcium: 8.3 mg/dL — ABNORMAL LOW (ref 8.9–10.3)
Chloride: 94 mmol/L — ABNORMAL LOW (ref 101–111)
Creatinine, Ser: 0.64 mg/dL (ref 0.61–1.24)
GFR calc Af Amer: >60 mL/min (ref >60)
Glucose, Bld: 150 mg/dL — ABNORMAL HIGH (ref 65–99)
POTASSIUM: 4.5 mmol/L (ref 3.5–5.1)
SODIUM: 137 mmol/L (ref 135–145)

## 2018-02-23 LAB — GLUCOSE, CAPILLARY
GLUCOSE-CAPILLARY: 168 mg/dL — AB (ref 65–99)
Glucose-Capillary: 140 mg/dL — ABNORMAL HIGH (ref 65–99)
Glucose-Capillary: 141 mg/dL — ABNORMAL HIGH (ref 65–99)
Glucose-Capillary: 135 mg/dL — ABNORMAL HIGH (ref 65–99)
Glucose-Capillary: 144 mg/dL — ABNORMAL HIGH (ref 65–99)
Glucose-Capillary: 157 mg/dL — ABNORMAL HIGH (ref 65–99)
Glucose-Capillary: 164 mg/dL — ABNORMAL HIGH (ref 65–99)

## 2018-02-23 LAB — BLOOD GAS, ARTERIAL
ALLENS TEST (PASS/FAIL): POSITIVE — AB
Acid-Base Excess: 20.6 mmol/L — ABNORMAL HIGH (ref 0.0–2.0)
BICARBONATE: 48 mmol/L — AB (ref 20.0–28.0)
FIO2: 0.45
O2 SAT: 94.5 %
PEEP: 5 cmH2O
PH ART: 7.42 (ref 7.350–7.450)
PO2 ART: 72 mmHg — AB (ref 83.0–108.0)
PRESSURE CONTROL: 14 cmH2O
Patient temperature: 37
RATE: 18 resp/min
pCO2 arterial: 74 mmHg (ref 32.0–48.0)

## 2018-02-23 LAB — URINE CULTURE: CULTURE: NO GROWTH

## 2018-02-23 LAB — MAGNESIUM: MAGNESIUM: 1.8 mg/dL (ref 1.7–2.4)

## 2018-02-23 MED ORDER — ACETYLCYSTEINE 20 % IN SOLN
2.0000 mL | Freq: Three times a day (TID) | RESPIRATORY_TRACT | Status: DC
  Administered 2018-02-23 – 2018-02-27 (×11): 2 mL via RESPIRATORY_TRACT
  Filled 2018-02-23 (×11): qty 4

## 2018-02-23 MED ORDER — FENTANYL CITRATE (PF) 100 MCG/2ML IJ SOLN: 75.0000 ug | INTRAMUSCULAR | Status: DC | PRN

## 2018-02-23 MED ORDER — ALPRAZOLAM 1 MG PO TABS
1.0000 mg | ORAL_TABLET | Freq: Two times a day (BID) | ORAL | Status: DC
  Administered 2018-02-23 (×2): 1 mg via ORAL
  Filled 2018-02-23 (×2): qty 1

## 2018-02-23 MED ORDER — ALBUMIN HUMAN 5 % IV SOLN
12.5000 g | Freq: Once | INTRAVENOUS | Status: AC
  Administered 2018-02-23: 12.5 g via INTRAVENOUS
  Filled 2018-02-23: qty 250

## 2018-02-23 MED ORDER — FUROSEMIDE 10 MG/ML IJ SOLN
40.0000 mg | Freq: Once | INTRAMUSCULAR | Status: AC
Start: 2018-02-23 — End: 2018-02-23
  Administered 2018-02-23: 40 mg via INTRAVENOUS
  Filled 2018-02-23: qty 4

## 2018-02-23 MED ORDER — GABAPENTIN 250 MG/5ML PO SOLN
200.0000 mg | Freq: Two times a day (BID) | ORAL | Status: DC
  Administered 2018-02-23 – 2018-03-18 (×46): 200 mg
  Filled 2018-02-23 (×56): qty 4

## 2018-02-23 NOTE — Progress Notes (Signed)
CC follow up hypoxic resp failure  SUBJECTIVE Remains on vent. Has become more somnolent over the course of today.   MAJOR EVENTS/TEST RESULTS: 04/08 Admission as above 04/08 CT chest: Multifocal severe pneumonia with RUL collapse and soft tissue obstructing RUL bronchus.  04/08 CTAP: Mild small bowel ileus, potential enteritis. Nasogastric tube terminates in distal stomach. Cholelithiasis without CT findings of acute cholecystitis.Consider bronchoscopy.  04/09 nephrology consultation: No acute indication for dialysis at present. 04/09 gastroenterology consultation: Recommend high-dose PPI therapy. 04/10 severely hypoxemic despite 100% FiO2.  Recruitment maneuver with significant improvement.  PEEP increased and ARDS strategy on ventilator implemented.  Neuromuscular blockade implemented due to ventilator dyssynchrony. 04/11 PAF with RVR > converted with amiodarone. Gas exchange improving. Off Nimbex 04/12 Echocardiogram: EF 20-25%.  Diffuse HK. 04/13 High airway pressure with vent dyssynchony; given Vecuronium; FIO2 increased to 50%  04/16 Cardiology consultation 04/13 Amiodarone D/C because of SB 04/15 increased HR, EF 20% afib with RVR 04/17 failed weaning trials, HR remains elevated 04/19 ENT consultation for trach tube placement 04/20 no significant changes in the last 24 hours. Has not tolerated any weaning  04/23 PEG placed. 04/23 intermittent ventilator dyssynchrony - sedation regimen adjusted 04/24 Somewhat more responsive. Not F/C 04/25 Trach tube placed 04/25 significantly more responsive 04/30 and hypoxemia and pulmonary infiltrates 05/02 fever, leukocytosis 05/03 severe ARDS pattern on CXR 05/03 ID consultation 05/14 goals of care discussion with patient's sister and mother.  Agree to continue aggressive support but DNR in event of cardiac arrest. Received one unit RBCs for Hgb 6.4. For severe ARDS, NMB protocol initiated 05/15 Gas exchange much improved. Remains on NMB.  Received one unit RBCs for Hgb 6.9 05/20 high fio2 requirements at 60%, PEEP at 12-plan for diuresis 05/25 CT head   BP 114/70 (BP Location: Left Arm)   Pulse 97   Temp 98.8 F (37.1 C) (Axillary)   Resp 15   Ht  (1.778 m)   Wt 236 lb 8.9 oz (107.3 kg)   SpO2 94%   BMI 33.94 kg/m   PHYSICAL EXAMINATION:  GENERAL:comfortable on vent today HEAD: Normocephalic, atraumatic.  EYES: Pupils equal, round, reactive to light.  No scleral icterus.  MOUTH: Moist mucosal membrane. NECK: Supple. No thyromegaly. No nodules. No JVD.  PULMONARY: +rhonchi, good air entry bilaterally CARDIOVASCULAR: S1 and S2. Regular rate and rhythm. No murmurs, rubs, or gallops.  GASTROINTESTINAL:Soft,nontender,distended.No masses. Positive bowel sounds. No hepatosplenomegaly.  MUSCULOSKELETAL: Right > left upper extremity oedema, lower extremities oedema, scrotal oedema has decreased NEUROLOGIC: awake this AM and follows simple commands but still lethargic SKIN:intact,warm,dry   LABS Reviewed 57 yo AAM with h/o TBI admitted 42 days ago for severe resp failure and severe shock and pneumonia complicated by multiorgan failure and severe agitation failure to wean from vent s/p trach with ongoing severe hypoxic resp failure from edema and pneumonia- radiographically and clinically improved.  Neurologically better today although still more lethargic than before.  NEUROLOGY Acute metabolic encephalopathy is slowly improving - Will decrease Xanax to 1 mg Q 12 hr and decrease prn Fentanyl  to - will continue to avoid sedative infusion   Severe Hypoxic and Hypercapnic Respiratory Failure-remains on full  Vent dependent/ pulmonary oedema, secretions are thick so will add Mucomyst fio2 slowly improving -continue Full MV support -continue Bronchodilator Therapy -Wean Fio2 and PEEP as tolerated - will give dose of Albumin and lasix today.    DVT/GI PRX ordered TRANSFUSIONS AS NEEDED MONITOR  FSBS ASSESS the need for  LABS as needed   Update: Wife and daughter updated.   Critical Care Time devoted to patient care services described in this note is 35 minutes.   Overall, patient is critically ill, prognosis is guarded.    Jackson Latino, M.D.  [facility]

## 2018-02-23 NOTE — Progress Notes (Signed)
Pharmacy Electrolyte Monitoring Consult:  Pharmacy consulted to assist in monitoring and replacing electrolytes in this 57 y.o. male admitted on 01/06/2018 with Respiratory Distress Patient receiving Magnesium oxide 400 mg PO BID.  Labs:  Sodium (mmol/L)  Date Value  02/23/2018 137   Potassium (mmol/L)  Date Value  02/23/2018 4.5   Magnesium (mg/dL)  Date Value  16/07/9603 1.8   Phosphorus (mg/dL)  Date Value  54/06/8118 3.9   Calcium (mg/dL)  Date Value  14/78/2956 8.3 (L)   Albumin (g/dL)  Date Value  21/30/8657 1.8 (L)   Corrected Calcium: 10.6  Assessment/Plan: No replacement warranted. Will recheck electrolytes with am labs.   Pharmacy will continue to monitor and adjust per consult.   Stormy Card, [facility] 02/23/2018 9:31 AM

## 2018-02-23 NOTE — Consult Note (Signed)
WOC Nurse wound consult note Reason for Consult: full thickness tissue injury to medial right wrist Wound type: etiology not known Pressure Injury POA: N/A Measurement:2cm x 1.2cm with black eschar obscuring wound depth Wound bed:As described above Drainage (amount, consistency, odor) None Periwound:intact, dry Dressing procedure/placement/frequency:I will provide guidance via the orders for twice daily NS cleanse with topical care consisting of the silicone foam dressing.  WOC nursing team will not follow, but will remain available to this patient, the nursing and medical teams.  Please re-consult if needed. Thanks, Ladona Mow, MSN, RN, GNP, [place]  Pager# [phone]

## 2018-02-23 NOTE — Progress Notes (Signed)
[employer] - [place] at [facility]   PATIENT NAME: Tommy Mathews    MR#:  [id number]  DATE OF BIRTH:  [birth year]  SUBJECTIVE:   Patient more alert this am   REVIEW OF SYSTEMS:  Unable to obtain Tolerating Diet: yes       DRUG ALLERGIES:  No Known Allergies  VITALS:  Blood pressure 110/65, pulse 94, temperature 99.3 F (37.4 C), temperature source Axillary, resp. rate 18, height  (1.778 m), weight 107.3 kg (236 lb 8.9 oz), SpO2 97 %.  PHYSICAL EXAMINATION:  Constitutional: Appears well-developed and well-nourished. No distress. HENT: Normocephalic. Marland Kitchen Oropharynx is clear and moist.  Tracheostomy site is intact Eyes: Conjunctivae and EOM are normal. , no scleral icterus.  Neck: Normal ROM. Neck supple. No JVD. No tracheal deviation. CVS: RRR, S1/S2 +, no murmurs, no gallops, no carotid bruit.  Pulmonary: Coarse breath sounds throughout lung fields Abdominal: Soft. BS +,  no distension, tenderness, rebound or guarding.  Musculoskeletal:1+ LEE Neuro: Alert. No focal deficits. Skin: Stage II decubitus ulcer buttock psychiatric: flat affect      LABORATORY PANEL:   CBC Recent Labs  Lab 02/22/18 1334  WBC 7.8  HGB 9.0*  HCT 28.7*  PLT 173   ------------------------------------------------------------------------------------------------------------------  Chemistries  Recent Labs  Lab 02/16/18 2316  02/23/18 0611  NA 144   < > 137  K 4.0   < > 4.5  CL 104   < > 94*  CO2 34*   < > 41*  GLUCOSE 206*   < > 150*  BUN 37*   < > 20  CREATININE 0.56*   < > 0.64  CALCIUM 7.9*   < > 8.3*  MG  --    < > 1.8  AST 20  --   --   ALT 19  --   --   ALKPHOS 69  --   --   BILITOT 0.4  --   --    < > = values in this interval not displayed.   ------------------------------------------------------------------------------------------------------------------  Cardiac Enzymes No results for input(s): TROPONINI in the last 168  hours. ------------------------------------------------------------------------------------------------------------------  RADIOLOGY:  Ct Head Wo Contrast  Result Date: 02/22/2018 CLINICAL DATA:  Altered mental status change. More sleepy than yesterday. EXAM: CT HEAD WITHOUT CONTRAST TECHNIQUE: Contiguous axial images were obtained from the base of the skull through the vertex without intravenous contrast. COMPARISON:  None FINDINGS: Brain: No evidence of acute infarction, hemorrhage, hydrocephalus, extra-axial collection or mass lesion/mass effect. There is mild diffuse low-attenuation within the subcortical and periventricular white matter compatible with chronic microvascular disease. Prominence of the sulci and ventricles compatible with brain atrophy. Vascular: No hyperdense vessel or unexpected calcification. Skull: Normal. Negative for fracture or focal lesion. Sinuses/Orbits: There is opacification of bilateral mastoid air cells. The paranasal sinuses appear clear. Other: None IMPRESSION: No acute intracranial abnormality. Small vessel ischemic disease and brain atrophy. Bilateral mastoid air cell opacification. Electronically Signed   By: Signa Kell M.D.   On: 02/22/2018 17:43   Dg Chest Port 1 View  Result Date: 02/22/2018 CLINICAL DATA:  Respiratory distress EXAM: PORTABLE CHEST 1 VIEW COMPARISON:  02/21/2018 FINDINGS: Tracheostomy tube and right upper extremity PICC are stable. Hazy airspace disease throughout both lungs is stable. No pneumothorax. No pleural effusion. IMPRESSION: Stable bilateral airspace disease. Consider pulmonary edema or ARDS. Electronically Signed   By: Jolaine Click M.D.   On: 02/22/2018 13:55     ASSESSMENT AND PLAN:   57 year old  male with history of TBI admitted for severe respiratory failure and severe shock with pneumonia.  1.  Severe hypoxic and hypercapnic respiratory failure: Patient remains on vent support via tracheostomy Management as per  intensivist  2.  Ventilator associated pneumonia with sepsis and Klebsiella: Patient has completed antibiotics  3.  PAF: Patient is currently in sinus rhythm Continue digoxin and metoprolol Amiodarone discontinued due to worsening of respiratory status  4.  Acute on chronic systolic heart failure: Currently euvolemic  continue Lasix as needed, lisinopril and metoprolol  5.  TBI/depression: Patient evaluated by psychiatry Continue valproic acid and Geodon  6.  Recent GI bleed on PPI with negative endoscopy Monitor hemoglobin  7.  Acute kidney injury in the setting of sepsis which is resolved  8.  Diabetes: Continue Lantus with sliding scale  9.  Stage II decubitus buttock ulcer: Continue wound care    Discussed with intensivist  Awaiting placement   CODE STATUS: DNR  TOTAL TIME TAKING CARE OF THIS PATIENT: 22 minutes.     POSSIBLE D/C ??, DEPENDING ON CLINICAL CONDITION.   Asante Blanda M.D on 02/23/2018 at 11:36 AM  Between 7am to 6pm - Pager - [phone] After 6pm go to [email or web address] - password [email or web address]  [facility] [place] [facility]  Office  [phone]  CC: Primary care physician; Marcina Millard, MD  Note: This dictation was prepared with Dragon dictation along with smaller phrase technology. Any transcriptional errors that result from this process are unintentional.

## 2018-02-23 NOTE — Plan of Care (Signed)
Patient continues on trach/vent.  Very drowsy this shift.  Not much interaction with staff this shift.  Responsive to his name being called. Good urine output.  Bathed. Will continue to monitor.

## 2018-02-24 ENCOUNTER — Inpatient Hospital Stay: Payer: Medicaid Other

## 2018-02-24 LAB — BLOOD GAS, ARTERIAL
ACID-BASE EXCESS: 21.2 mmol/L — AB (ref 0.0–2.0)
Bicarbonate: 48.2 mmol/L — ABNORMAL HIGH (ref 20.0–28.0)
Expiratory PAP: 5
FIO2: 0.45
Inspiratory PAP: 14
O2 SAT: 89.8 %
PATIENT TEMPERATURE: 37
PO2 ART: 56 mmHg — AB (ref 83.0–108.0)
RATE: 18 resp/min
pCO2 arterial: 71 mmHg (ref 32.0–48.0)
pH, Arterial: 7.44 (ref 7.350–7.450)

## 2018-02-24 LAB — CBC
HCT: 29.8 % — ABNORMAL LOW (ref 40.0–52.0)
HEMOGLOBIN: 9.4 g/dL — AB (ref 13.0–18.0)
MCH: 27.6 pg (ref 26.0–34.0)
MCHC: 31.6 g/dL — ABNORMAL LOW (ref 32.0–36.0)
MCV: 87.3 fL (ref 80.0–100.0)
PLATELETS: 190 10*3/uL (ref 150–440)
RBC: 3.41 MIL/uL — AB (ref 4.40–5.90)
RDW: 20.1 % — ABNORMAL HIGH (ref 11.5–14.5)
WBC: 8.6 10*3/uL (ref 3.8–10.6)

## 2018-02-24 LAB — VALPROIC ACID LEVEL: VALPROIC ACID LVL: 57 ug/mL (ref 50.0–100.0)

## 2018-02-24 LAB — GLUCOSE, CAPILLARY
GLUCOSE-CAPILLARY: 170 mg/dL — AB (ref 65–99)
Glucose-Capillary: 131 mg/dL — ABNORMAL HIGH (ref 65–99)
Glucose-Capillary: 132 mg/dL — ABNORMAL HIGH (ref 65–99)
Glucose-Capillary: 141 mg/dL — ABNORMAL HIGH (ref 65–99)
Glucose-Capillary: 162 mg/dL — ABNORMAL HIGH (ref 65–99)
Glucose-Capillary: 165 mg/dL — ABNORMAL HIGH (ref 65–99)

## 2018-02-24 LAB — BASIC METABOLIC PANEL
Anion gap: 6 (ref 5–15)
BUN: 20 mg/dL (ref 6–20)
CHLORIDE: 92 mmol/L — AB (ref 101–111)
CO2: 40 mmol/L — ABNORMAL HIGH (ref 22–32)
Calcium: 8.3 mg/dL — ABNORMAL LOW (ref 8.9–10.3)
Creatinine, Ser: 0.71 mg/dL (ref 0.61–1.24)
Glucose, Bld: 168 mg/dL — ABNORMAL HIGH (ref 65–99)
POTASSIUM: 4.4 mmol/L (ref 3.5–5.1)
SODIUM: 138 mmol/L (ref 135–145)

## 2018-02-24 LAB — CULTURE, RESPIRATORY W GRAM STAIN: Culture: NORMAL

## 2018-02-24 LAB — LACTIC ACID, PLASMA
LACTIC ACID, VENOUS: 1 mmol/L (ref 0.5–1.9)
Lactic Acid, Venous: 0.7 mmol/L (ref 0.5–1.9)

## 2018-02-24 LAB — CULTURE, RESPIRATORY

## 2018-02-24 LAB — MAGNESIUM: MAGNESIUM: 1.8 mg/dL (ref 1.7–2.4)

## 2018-02-24 LAB — CK: Total CK: 70 U/L (ref 49–397)

## 2018-02-24 MED ORDER — VANCOMYCIN HCL 10 G IV SOLR
1250.0000 mg | Freq: Three times a day (TID) | INTRAVENOUS | Status: DC
  Administered 2018-02-24 – 2018-02-25 (×2): 1250 mg via INTRAVENOUS
  Filled 2018-02-24 (×5): qty 1250

## 2018-02-24 MED ORDER — SODIUM CHLORIDE 0.9 % IV SOLN
2.0000 g | Freq: Three times a day (TID) | INTRAVENOUS | Status: DC
  Administered 2018-02-24 – 2018-02-25 (×3): 2 g via INTRAVENOUS
  Filled 2018-02-24 (×5): qty 2

## 2018-02-24 MED ORDER — VALPROATE SODIUM 500 MG/5ML IV SOLN
750.0000 mg | Freq: Every day | INTRAVENOUS | Status: DC
  Administered 2018-02-24: 750 mg via INTRAVENOUS
  Filled 2018-02-24: qty 7.5

## 2018-02-24 MED ORDER — VALPROIC ACID 250 MG/5ML PO SOLN: 750.0000 mg | Freq: Every day | ORAL | Status: DC

## 2018-02-24 MED ORDER — VANCOMYCIN HCL IN DEXTROSE 1-5 GM/200ML-% IV SOLN
1000.0000 mg | Freq: Once | INTRAVENOUS | Status: AC
  Administered 2018-02-24: 1000 mg via INTRAVENOUS
  Filled 2018-02-24: qty 200

## 2018-02-24 MED ORDER — VALPROIC ACID 250 MG/5ML PO SOLN
750.0000 mg | Freq: Every day | ORAL | Status: DC
Start: 2018-02-25 — End: 2018-02-27
  Administered 2018-02-25 – 2018-02-27 (×3): 750 mg
  Filled 2018-02-24 (×3): qty 15

## 2018-02-24 MED ORDER — ALPRAZOLAM 0.5 MG PO TABS: 0.5000 mg | ORAL_TABLET | Freq: Two times a day (BID) | ORAL | Status: DC

## 2018-02-24 NOTE — Progress Notes (Signed)
Pharmacy Electrolyte Monitoring Consult:  Pharmacy consulted to assist in monitoring and replacing electrolytes in this 57 y.o. male admitted on 01/06/2018 with Respiratory Distress Patient receiving Magnesium oxide 400 mg PO BID.  Labs:  Sodium (mmol/L)  Date Value  02/24/2018 138   Potassium (mmol/L)  Date Value  02/24/2018 4.4   Magnesium (mg/dL)  Date Value  16/07/9603 1.8   Phosphorus (mg/dL)  Date Value  54/06/8118 3.9   Calcium (mg/dL)  Date Value  14/78/2956 8.3 (L)   Albumin (g/dL)  Date Value  21/30/8657 1.8 (L)   Corrected Calcium: 10.1  Assessment/Plan: No replacement warranted. Will recheck electrolytes with am labs.   Pharmacy will continue to monitor and adjust per consult.   Marty Heck, [facility] 02/24/2018 9:50 AM

## 2018-02-24 NOTE — Progress Notes (Signed)
Pharmacy Antibiotic Note  Tommy Mathews is a 57 y.o. male being treated for possible HCAP/ pneumonia. Patient has had extended/complicated stay at hospital. Pharmacy has been consulted for cefepime and vancomycin dosing. Patient currently requiring mechanical ventilation and has a PEG.   Patient previously treated for aspiration pneumonia this admission as well as klebsiella bacteremia.  Plan: Vancomycin  x once  Followed by Vancomycin  Q8H Cefepime 2g Q8H  Vancomycin trough will be obtained prior to 4th dose. Pharmacy will continue to follow and adjust as needed.   Ke= 0.109 T1/2 = 6.4 hours Vd=59.85 Cmin = 16.7 Stack dose @ 6 hours   Height:  (177.8 cm) Weight: 229 lb 15 oz (104.3 kg) IBW/kg (Calculated) : 73  Adjusted Body Weight: 98kg   Temp (24hrs), Avg:99.6 F (37.6 C), Min:98.9 F (37.2 C), Max:101 F (38.3 C)  Recent Labs  Lab 02/19/18 0458 02/20/18 0427 02/21/18 0407 02/22/18 0622 02/22/18 1334 02/23/18 0611 02/24/18 0528  WBC 9.5 9.7  --   --  7.8  --  8.6  CREATININE 0.60*  --  0.68 0.73  --  0.64 0.71    Estimated Creatinine Clearance: 126.2 mL/min (by C-G formula based on SCr of 0.71 mg/dL).    No Known Allergies  Antimicrobials this admission: Vancomycin 5/3 >> 5/10 Meropenem 5/3 >> 5/10 Anidulafungin 5/3 >> 5/6 Cefazolin 5/11 >> 5/13    Microbiology results: 5/3 BCx: Klebsiella Pneumoniae 5/7 BCx: no growth  5/3, 5/25 UCx: NG  5/3 Tracheal Aspirate: mod GPC, GPR  4/20, 5/8 MRSA PCR: positive 5/16 Trach aspirate: Kleb pneumo 5/25 Trach aspirate: normal resp flora  5/27 Trach aspirate: GPC in pairs/chains, GNR 5/27 BCx: NG < 12 hrs  Thank you for allowing pharmacy to be a part of this patient's care.  Carolynne Edouard, PharmD Pharmacy Resident  02/24/2018 12:07 PM

## 2018-02-24 NOTE — Progress Notes (Signed)
Pt remains somnolent, FC in am ->wiggles toes, lightly squeezed hands, opens eyes to voice->decreased LOC as day progressed-seemingly after med administration-valproic acid & gabapentin. Afebrile this shift.  Pt has remained on 45%, secretions remain thick and tan/yellow.  Pt has remained in NSR, BP has been appropriate, soft at times. All BP meds have been HELD not meeting parameters.

## 2018-02-24 NOTE — Clinical Social Work Note (Signed)
Patient does not appear to be medically ready for transfer at this time. Boneta Lucks at [facility] is out of the office today since today it is a holiday. CSW will follow up with Boneta Lucks tomorrow as she stated last Friday she would be coming into town to meet with patient's mother to complete [place] paperwork.  York Spaniel MSW,LCSW [phone]

## 2018-02-24 NOTE — Progress Notes (Signed)
[facility] [place] [facility]   PATIENT NAME: Tommy Mathews    MR#:  [id number]  DATE OF BIRTH:  01/13/1961  SUBJECTIVE:   Patient drowsy this morning.  Patient had fever.  Blood cultures ordered.  REVIEW OF SYSTEMS:  Unable to obtain Tolerating Diet: yes       DRUG ALLERGIES:  No Known Allergies  VITALS:  Blood pressure 116/69, pulse (!) 105, temperature (!) 101 F (38.3 C), temperature source Axillary, resp. rate 15, height  (1.778 m), weight 104.3 kg (229 lb 15 oz), SpO2 95 %.  PHYSICAL EXAMINATION:  Constitutional: Appears Eckley ill-appearing. No distress. HENT: Normocephalic. Marland Kitchen Oropharynx is clear and moist.  Tracheostomy site is intact Eyes: Conjunctivae and EOM are normal. , no scleral icterus.  Neck: Normal ROM. Neck supple. No JVD. No tracheal deviation. CVS: RRR, S1/S2 +, no murmurs, no gallops, no carotid bruit.  Pulmonary: Coarse breath sounds throughout lung fields Abdominal: Soft. BS +,  no distension, tenderness, rebound or guarding.  Musculoskeletal:1+ LEE Neuro: Lethargic this morning.  Skin: Stage II decubitus ulcer buttock psychiatric: Lethargic    LABORATORY PANEL:   CBC Recent Labs  Lab 02/24/18 0528  WBC 8.6  HGB 9.4*  HCT 29.8*  PLT 190   ------------------------------------------------------------------------------------------------------------------  Chemistries  Recent Labs  Lab 02/24/18 0528  NA 138  K 4.4  CL 92*  CO2 40*  GLUCOSE 168*  BUN 20  CREATININE 0.71  CALCIUM 8.3*  MG 1.8   ------------------------------------------------------------------------------------------------------------------  Cardiac Enzymes No results for input(s): TROPONINI in the last 168 hours. ------------------------------------------------------------------------------------------------------------------  RADIOLOGY:  Ct Head Wo Contrast  Result Date: 02/22/2018 CLINICAL DATA:  Altered mental status  change. More sleepy than yesterday. EXAM: CT HEAD WITHOUT CONTRAST TECHNIQUE: Contiguous axial images were obtained from the base of the skull through the vertex without intravenous contrast. COMPARISON:  None FINDINGS: Brain: No evidence of acute infarction, hemorrhage, hydrocephalus, extra-axial collection or mass lesion/mass effect. There is mild diffuse low-attenuation within the subcortical and periventricular white matter compatible with chronic microvascular disease. Prominence of the sulci and ventricles compatible with brain atrophy. Vascular: No hyperdense vessel or unexpected calcification. Skull: Normal. Negative for fracture or focal lesion. Sinuses/Orbits: There is opacification of bilateral mastoid air cells. The paranasal sinuses appear clear. Other: None IMPRESSION: No acute intracranial abnormality. Small vessel ischemic disease and brain atrophy. Bilateral mastoid air cell opacification. Electronically Signed   By: Signa Kell M.D.   On: 02/22/2018 17:43   Dg Chest Port 1 View  Result Date: 02/24/2018 CLINICAL DATA:  Respiratory failure.  Follow-up exam. EXAM: PORTABLE CHEST 1 VIEW COMPARISON:  02/22/2018 and older studies. FINDINGS: Irregular bilateral heterogeneous interstitial lung opacities are similar to prior exams allowing for differences in patient positioning and technique. No convincing new lung abnormalities. No visualized pleural effusions.  No pneumothorax. Tracheostomy tube and right PICC are stable. IMPRESSION: 1. No change in lung aeration. Persistent bilateral heterogeneous interstitial lung opacities suspicious for ARDS given the limited interval change. Electronically Signed   By: Amie Portland M.D.   On: 02/24/2018 08:09   Dg Chest Port 1 View  Result Date: 02/22/2018 CLINICAL DATA:  Respiratory distress EXAM: PORTABLE CHEST 1 VIEW COMPARISON:  02/21/2018 FINDINGS: Tracheostomy tube and right upper extremity PICC are stable. Hazy airspace disease throughout both  lungs is stable. No pneumothorax. No pleural effusion. IMPRESSION: Stable bilateral airspace disease. Consider pulmonary edema or ARDS. Electronically Signed   By: Jolaine Click M.D.   On:  02/22/2018 13:55     ASSESSMENT AND PLAN:   57 year old male with history of TBI admitted for severe respiratory failure and severe shock with pneumonia.  1.  Severe hypoxic and hypercapnic respiratory failure/ARDS: Patient remains on vent support via tracheostomy Management as per intensivist  2.  Ventilator associated pneumonia with sepsis and Klebsiella: Patient has completed antibiotics  3.  PAF: Patient is currently in sinus rhythm Continue digoxin and metoprolol Amiodarone discontinued due to worsening of respiratory status  4.  Acute on chronic systolic heart failure: Currently euvolemic  continue Lasix as needed, lisinopril and metoprolol  5.  TBI/depression: Patient evaluated by psychiatry Continue valproic acid and Seroquel  6.  Recent GI bleed on PPI with negative endoscopy Monitor hemoglobin  7.  Acute kidney injury in the setting of sepsis which is resolved  8.  Diabetes: Continue Lantus with sliding scale  9.  Stage II decubitus buttock ulcer: Continue wound care  10.  Nutrition: On tube feeds  11.  Lethargy with fever: Follow-up on blood culture. Consider decreasing dose of fentanyl patch Chest x-ray this morning shows persistent ARDS    Awaiting placement   CODE STATUS: DNR  TOTAL TIME TAKING CARE OF THIS PATIENT: 23 minutes.     POSSIBLE D/C ??, DEPENDING ON CLINICAL CONDITION.   Dasia Guerrier M.D on 02/24/2018 at 8:52 AM  Between 7am to 6pm - Pager - [phone] After 6pm go to [email or web address] - password [email or web address]  [facility]  Office  [phone]  CC: Primary care physician; Marcina Millard, MD  Note: This dictation was prepared with Dragon dictation along with smaller phrase technology. Any transcriptional errors that result from  this process are unintentional.

## 2018-02-24 NOTE — Progress Notes (Addendum)
Name: Tommy Mathews MRN: [id number] DOB: 05-Aug-1961     CONSULTATION DATE: 01/06/2018  Subjective & Objective: Febrile Tmax 101. PEG tube malfunction. And somnolent.  PAST MEDICAL HISTORY :   has a past medical history of Diabetes (HCC), GERD (gastroesophageal reflux disease), HLD (hyperlipidemia), and HTN (hypertension).  has a past surgical history that includes PEG placement (N/A, 01/21/2018) and Tracheostomy tube placement (N/A, 01/23/2018). Prior to Admission medications   Medication Sig Start Date End Date Taking? Authorizing Provider  atorvastatin (LIPITOR) 80 MG tablet Take 80 mg by mouth at bedtime.   Yes [provider]  divalproex (DEPAKOTE ER) 500 MG 24 hr tablet Take 500 mg by mouth 2 (two) times daily.   Yes [provider]  estradiol (ESTRACE) 2 MG tablet Take 2 mg by mouth daily.   Yes [provider]  FLUoxetine (PROZAC) 40 MG capsule Take 40 mg by mouth daily.   Yes [provider]  folic acid (FOLVITE) 800 MCG tablet Take 800 mcg by mouth daily.   Yes [provider]  hydrochlorothiazide (HYDRODIURIL) 50 MG tablet Take 50 mg by mouth daily.   Yes [provider]  lisinopril (PRINIVIL,ZESTRIL) 5 MG tablet Take 5 mg by mouth daily.   Yes [provider]  metFORMIN (GLUCOPHAGE) 1000 MG tablet Take 1,000 mg by mouth 2 (two) times daily with a meal.   Yes [provider]  methotrexate (RHEUMATREX) 2.5 MG tablet Take 15 mg by mouth once a week.   Yes [provider]  metoprolol tartrate (LOPRESSOR) 50 MG tablet Take 50 mg by mouth 2 (two) times daily.   Yes [provider]  Omega-3 Fatty Acids (FISH OIL) 1000 MG CAPS Take 1,000 mg by mouth daily.   Yes [provider]  QUEtiapine Fumarate (SEROQUEL PO) Take 500 mg by mouth 2 (two) times daily.   Yes [provider]  ranitidine (ZANTAC) 150 MG tablet Take 150 mg by mouth daily.   Yes [provider]  rivaroxaban  (XARELTO) 10 MG TABS tablet Take 10 mg by mouth daily.   Yes [provider]  senna-docusate (SENOKOT-S) 8.6-50 MG tablet Take 2 tablets by mouth at bedtime.   Yes [provider]  sodium chloride 1 g tablet Take 1 g by mouth daily.   Yes [provider]   No Known Allergies  FAMILY HISTORY:  family history is not on file. SOCIAL HISTORY:  reports that he has been smoking cigarettes.  He started smoking about 19 years ago. He has a 40.00 pack-year smoking history. He has never used smokeless tobacco. He reports that he does not drink alcohol or use drugs.  REVIEW OF SYSTEMS:   Unable to obtain due to critical illness   VITAL SIGNS: Temp:  [98.8 F (37.1 C)-101 F (38.3 C)] 98.9 F (37.2 C) (05/27 0800) Pulse Rate:  [93-112] 94 (05/27 1016) Resp:  [14-28] 23 (05/27 0900) BP: (94-136)/(50-84) 110/62 (05/27 0900) SpO2:  [85 %-100 %] 99 % (05/27 0900) FiO2 (%):  [40 %-55 %] 55 % (05/27 0800) Weight:  [104.3 kg (229 lb 15 oz)] 104.3 kg (229 lb 15 oz) (05/27 0400)  Physical Examination:  Awake, tracks and making eye contact. Not following commands Pm Vent, trach in place. PC, no distress. BEAE and no rales. S1 & S2 audible and no murmur PEG in place, benign abdomen with feeble peristalses wasted extremities and no edema   ASSESSMENT / PLAN:  Acute on chronic respiratory failure s/p tracheostomy.  ARDS with P/F on 45% PEEP of 5, on PC. -Monitor P/F and continue with vent support.  Altered mental status. 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 CT head 5/25 no acute intracranial abnormality. On Valproic acid + Seroquel + Xanax + Gabapentin + Fentanyl l patch and iv PRN. -Optimize Valproic acid, hold Xanax , Seroquel and Fentanyl.  Atelectasis and pneumonia. Bil. Airspace disease with fever and increased FIO2 requirement.  -HCAP coverage with Cefep + Vanc -Monitor CXR + CBC + FIO2 and cultures.  Fever. BLD Cult 5/27 remains -ve, Urine Cult 5/27 -ve and  Sput 5/17 -ve  PAF. ECHO 12/2017 LV EF 20-25%  -Currently in NSR.  GI bleeding. -ve endoscopy -PPI  DM -Glycemic control.  Sacral decub St/ III -Wound care  Anemia -Keep HB > 7 gm/dl  DNR  DVT & GI prophylaxis.Continue with supportive care Family was updated at the bedside.  Critical care time 35 min

## 2018-02-24 NOTE — Plan of Care (Signed)
Patient continues on trach/vent at 55%.  Very drowsy and lethargic this shift. Temp 101.0.  Given APAP 650 mg.  Morning ABG reported to provider.  Sputum and blood cultures done. Will continue to monitor.

## 2018-02-25 ENCOUNTER — Inpatient Hospital Stay: Payer: Medicaid Other

## 2018-02-25 LAB — BLOOD GAS, ARTERIAL
Acid-Base Excess: 19.7 mmol/L — ABNORMAL HIGH (ref 0.0–2.0)
BICARBONATE: 47.1 mmol/L — AB (ref 20.0–28.0)
EXPIRATORY PAP: 5
FIO2: 0.45
INSPIRATORY PAP: 14
O2 SAT: 91.3 %
PATIENT TEMPERATURE: 37.2
PCO2 ART: 71 mmHg — AB (ref 32.0–48.0)
PH ART: 7.43 (ref 7.350–7.450)
PO2 ART: 61 mmHg — AB (ref 83.0–108.0)
RATE: 18 resp/min

## 2018-02-25 LAB — BLOOD CULTURE ID PANEL (REFLEXED)
Acinetobacter baumannii: NOT DETECTED
CANDIDA GLABRATA: NOT DETECTED
CARBAPENEM RESISTANCE: NOT DETECTED
Candida albicans: NOT DETECTED
Candida krusei: NOT DETECTED
Candida parapsilosis: NOT DETECTED
Candida tropicalis: NOT DETECTED
ENTEROCOCCUS SPECIES: NOT DETECTED
ESCHERICHIA COLI: NOT DETECTED
Enterobacter cloacae complex: NOT DETECTED
Enterobacteriaceae species: DETECTED — AB
Haemophilus influenzae: NOT DETECTED
Klebsiella oxytoca: NOT DETECTED
Klebsiella pneumoniae: DETECTED — AB
LISTERIA MONOCYTOGENES: NOT DETECTED
Neisseria meningitidis: NOT DETECTED
PROTEUS SPECIES: NOT DETECTED
Pseudomonas aeruginosa: NOT DETECTED
SERRATIA MARCESCENS: NOT DETECTED
STAPHYLOCOCCUS AUREUS BCID: NOT DETECTED
STAPHYLOCOCCUS SPECIES: NOT DETECTED
Streptococcus agalactiae: NOT DETECTED
Streptococcus pneumoniae: NOT DETECTED
Streptococcus pyogenes: NOT DETECTED
Streptococcus species: NOT DETECTED

## 2018-02-25 LAB — CBC WITH DIFFERENTIAL/PLATELET
BASOS ABS: 0.1 10*3/uL (ref 0–0.1)
Basophils Relative: 1 %
EOS PCT: 3 %
Eosinophils Absolute: 0.3 10*3/uL (ref 0–0.7)
HCT: 26.1 % — ABNORMAL LOW (ref 40.0–52.0)
Hemoglobin: 8.4 g/dL — ABNORMAL LOW (ref 13.0–18.0)
LYMPHS PCT: 21 %
Lymphs Abs: 2.3 10*3/uL (ref 1.0–3.6)
MCH: 28 pg (ref 26.0–34.0)
MCHC: 32.3 g/dL (ref 32.0–36.0)
MCV: 86.7 fL (ref 80.0–100.0)
Monocytes Absolute: 2.5 10*3/uL — ABNORMAL HIGH (ref 0.2–1.0)
Monocytes Relative: 23 %
Neutro Abs: 5.6 10*3/uL (ref 1.4–6.5)
Neutrophils Relative %: 52 %
PLATELETS: 151 10*3/uL (ref 150–440)
RBC: 3.01 MIL/uL — ABNORMAL LOW (ref 4.40–5.90)
RDW: 19.3 % — ABNORMAL HIGH (ref 11.5–14.5)
WBC: 10.9 10*3/uL — ABNORMAL HIGH (ref 3.8–10.6)

## 2018-02-25 LAB — VALPROIC ACID LEVEL, FREE: Valproic Acid, Free: 33.4 ug/mL — ABNORMAL HIGH (ref 6.0–22.0)

## 2018-02-25 LAB — COMPREHENSIVE METABOLIC PANEL
ALBUMIN: 1.8 g/dL — AB (ref 3.5–5.0)
ALK PHOS: 59 U/L (ref 38–126)
ALT: 33 U/L (ref 17–63)
AST: 50 U/L — AB (ref 15–41)
Anion gap: 5 (ref 5–15)
BILIRUBIN TOTAL: 0.9 mg/dL (ref 0.3–1.2)
BUN: 24 mg/dL — AB (ref 6–20)
CO2: 36 mmol/L — ABNORMAL HIGH (ref 22–32)
CREATININE: 0.64 mg/dL (ref 0.61–1.24)
Calcium: 8.1 mg/dL — ABNORMAL LOW (ref 8.9–10.3)
Chloride: 95 mmol/L — ABNORMAL LOW (ref 101–111)
GFR calc Af Amer: >60 mL/min (ref >60)
GFR calc non Af Amer: >60 mL/min (ref >60)
GLUCOSE: 155 mg/dL — AB (ref 65–99)
POTASSIUM: 4.1 mmol/L (ref 3.5–5.1)
Sodium: 136 mmol/L (ref 135–145)
TOTAL PROTEIN: 5.8 g/dL — AB (ref 6.5–8.1)

## 2018-02-25 LAB — PHOSPHORUS: Phosphorus: 2.7 mg/dL (ref 2.5–4.6)

## 2018-02-25 LAB — GLUCOSE, CAPILLARY
GLUCOSE-CAPILLARY: 178 mg/dL — AB (ref 65–99)
GLUCOSE-CAPILLARY: 135 mg/dL — AB (ref 65–99)
GLUCOSE-CAPILLARY: 158 mg/dL — AB (ref 65–99)
GLUCOSE-CAPILLARY: 143 mg/dL — AB (ref 65–99)
Glucose-Capillary: 139 mg/dL — ABNORMAL HIGH (ref 65–99)

## 2018-02-25 LAB — MAGNESIUM: MAGNESIUM: 1.7 mg/dL (ref 1.7–2.4)

## 2018-02-25 MED ORDER — MAGNESIUM SULFATE IN D5W 1-5 GM/100ML-% IV SOLN
1.0000 g | Freq: Once | INTRAVENOUS | Status: AC
  Administered 2018-02-25: 1 g via INTRAVENOUS
  Filled 2018-02-25: qty 100

## 2018-02-25 MED ORDER — FUROSEMIDE 10 MG/ML IJ SOLN
20.0000 mg | Freq: Four times a day (QID) | INTRAMUSCULAR | Status: AC
  Administered 2018-02-25 (×2): 20 mg via INTRAVENOUS
  Filled 2018-02-25 (×2): qty 2

## 2018-02-25 MED ORDER — SODIUM CHLORIDE 0.9 % IV SOLN
1.0000 g | Freq: Three times a day (TID) | INTRAVENOUS | Status: DC
  Administered 2018-02-25 – 2018-02-27 (×6): 1 g via INTRAVENOUS
  Filled 2018-02-25 (×8): qty 1

## 2018-02-25 MED ORDER — ENOXAPARIN SODIUM 40 MG/0.4ML ~~LOC~~ SOLN
40.0000 mg | Freq: Two times a day (BID) | SUBCUTANEOUS | Status: DC
  Administered 2018-02-25 – 2018-02-26 (×3): 40 mg via SUBCUTANEOUS
  Filled 2018-02-25 (×3): qty 0.4

## 2018-02-25 NOTE — Progress Notes (Signed)
Nutrition Follow-up  DOCUMENTATION CODES:   Obesity unspecified  INTERVENTION:  Continue Glucerna 1.5 Cal at 65 mL/hr (1560 mL goal daily volume). Provides 2340 kcal, 129 grams of protein, 25 grams of fiber, 1186 mL H2O daily.  With free water flush of 150 mL Q8hrs patient is receiving a total of 1636 mL H2O daily including water in tube feeding.  Continue Juven BID per tube, each supplement provides 80 kcal, 14 grams of amino acids, and vitamins/minerals needed for wound healing.  NUTRITION DIAGNOSIS:   Inadequate oral intake related to inability to eat as evidenced by NPO status.  Ongoing - addressing with TF regimen.  GOAL:   Provide needs based on ASPEN/SCCM guidelines  Met with TF regimen.  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   -Patient's G-tube was clogged on 5/27, but issue was resolved and tube feedings were resumed.  Patient remains on mechanical ventilation via tracheostomy tube. At time of RD assessment this AM he was in pressure control mode. Patient had previously been having regular bowel movements. Now last bowel movement documented was on 5/22. Consider resuming bowel regimen.  Access: 20 Fr G-tube with C-clamp placed 4/23; placement verified by relook endoscopy; 4.5 cm at external bumper  TF: pt continues to tolerate tube feeding; RD notified by RN prior to seeing patient that Glucerna 1.2 Cal was hanging instead of Glucerna 1.5 Cal; plan is to switch patient back to Glucerna 1.5 Cal as  ordered  Patient is currently intubated on ventilator support Ve: 15.5 L/min Temp (24hrs), Avg:99.4 F (37.4 C), Min:98.6 F (37 C), Max:101 F (38.3 C)  Propofol: N/A  Medications reviewed and include: free water flush 150 mL Q8hrs, Lasix 20 mg Q6hrs IV, gabapentin, Novolog 0-20 units Q4hrs, Lantus 20 units QHS, magnesium oxide 400 mg BID, Juven BID, Protonix, valproic acid, magnesium sulfate 1 gram IV once today, meropenem.  Labs reviewed: CBG 131-178, Chloride 95, CO2 36, BUN 24.  I/O: 1300 mL UOP yesterday (0.3 mL/kg/hr)  Weight trend: 107.1 kg on 5/28; pt is back near admission wt of 107.4 kg and edema has improved  Discussed with RN and on rounds.  Diet Order:   Diet Order    None      EDUCATION NEEDS:   Not appropriate for education at this time  Skin:  Skin Assessment: Skin Integrity Issues: Skin Integrity Issues:: Stage II, Other (Comment) Stage II: coccyx (100% of wound base red or granulating) Other: open blister b/l thighs; MSAD to groin  Last BM:  02/19/2018 - large type 6  Height:   Ht Readings from Last 1 Encounters:  01/07/18 _0  (1.778 m)    Weight:   Wt Readings from Last 1 Encounters:  02/25/18 236 lb 1.8 oz (107.1 kg)    Ideal Body Weight:  75.5 kg  BMI:  Body mass index is 33.88 kg/m.  Estimated Nutritional Needs:   Kcal:  2295 (PSU 2003b w/ MSJ 1919)  Protein:  113-135 grams (1.5-1.8 grams/kg IBW)  Fluid:  1.8 L/day  Willey Blade, MS, RD, LDN Office: [phone]  Pager: [phone] After Hours/Weekend Pager: [phone]

## 2018-02-25 NOTE — Progress Notes (Signed)
Name: Tommy Mathews MRN: [id number] DOB: October 11, 1960     CONSULTATION DATE: 01/06/2018  Improved mental status and no major issues last night   PAST MEDICAL HISTORY :   has a past medical history of Diabetes (HCC), GERD (gastroesophageal reflux disease), HLD (hyperlipidemia), and HTN (hypertension).  has a past surgical history that includes PEG placement (N/A, 01/21/2018) and Tracheostomy tube placement (N/A, 01/23/2018). Prior to Admission medications   Medication Sig Start Date End Date Taking? Authorizing Provider  atorvastatin (LIPITOR) 80 MG tablet Take 80 mg by mouth at bedtime.   Yes [provider]  divalproex (DEPAKOTE ER) 500 MG 24 hr tablet Take 500 mg by mouth 2 (two) times daily.   Yes [provider]  estradiol (ESTRACE) 2 MG tablet Take 2 mg by mouth daily.   Yes [provider]  FLUoxetine (PROZAC) 40 MG capsule Take 40 mg by mouth daily.   Yes [provider]  folic acid (FOLVITE) 800 MCG tablet Take 800 mcg by mouth daily.   Yes [provider]  hydrochlorothiazide (HYDRODIURIL) 50 MG tablet Take 50 mg by mouth daily.   Yes [provider]  lisinopril (PRINIVIL,ZESTRIL) 5 MG tablet Take 5 mg by mouth daily.   Yes [provider]  metFORMIN (GLUCOPHAGE) 1000 MG tablet Take 1,000 mg by mouth 2 (two) times daily with a meal.   Yes [provider]  methotrexate (RHEUMATREX) 2.5 MG tablet Take 15 mg by mouth once a week.   Yes [provider]  metoprolol tartrate (LOPRESSOR) 50 MG tablet Take 50 mg by mouth 2 (two) times daily.   Yes [provider]  Omega-3 Fatty Acids (FISH OIL) 1000 MG CAPS Take 1,000 mg by mouth daily.   Yes [provider]  QUEtiapine Fumarate (SEROQUEL PO) Take 500 mg by mouth 2 (two) times daily.   Yes [provider]  ranitidine (ZANTAC) 150 MG tablet Take 150 mg by mouth daily.   Yes [provider]  rivaroxaban (XARELTO) 10 MG TABS  tablet Take 10 mg by mouth daily.   Yes [provider]  senna-docusate (SENOKOT-S) 8.6-50 MG tablet Take 2 tablets by mouth at bedtime.   Yes [provider]  sodium chloride 1 g tablet Take 1 g by mouth daily.   Yes [provider]   No Known Allergies  FAMILY HISTORY:  family history is not on file. SOCIAL HISTORY:  reports that he has been smoking cigarettes.  He started smoking about 19 years ago. He has a 40.00 pack-year smoking history. He has never used smokeless tobacco. He reports that he does not drink alcohol or use drugs.    VITAL SIGNS: Temp:  [98.6 F (37 C)-101 F (38.3 C)] 98.6 F (37 C) (05/28 0800) Pulse Rate:  [87-110] 96 (05/28 1025) Resp:  [15-27] 18 (05/28 1000) BP: (99-137)/(52-80) 127/68 (05/28 1025) SpO2:  [91 %-99 %] 93 % (05/28 1000) FiO2 (%):  [45 %-55 %] 45 % (05/28 0750) Weight:  [171.5 kg (378 lb 1.4 oz)] 171.5 kg (378 lb 1.4 oz) (05/28 0418)  Physical Examination:  Awake, tracks and making eye contact and following simple commands Pm Vent, trach in place. PC, no distress. BEAE and no rales. Moderate respiratory secretions. S1 & S2 audible and no murmur PEG in place, benign abdomen with feeble peristalses wasted extremities and no edema  ASSESSMENT / PLAN:  Acute on chronic respiratory failure s/p tracheostomy. ARDS (improved) with P/F135 on 45% PEEP of 5, on  PC. -Monitor P/F and continue with vent support.  Altered mental status (improved). 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 CT head 5/25 no acute intracranial abnormality. On Valproic acid + Seroquel + Xanax + Gabapentin + Fentanyl l patch and iv PRN. -Optimize Valproic acid, hold Xanax , Seroquel and Fentanyl.  Atelectasis and pneumonia. Worsening Bil. Airspace disease pulmonary congestion. Treat for HCAP with Merop and follow with tracheal aspirate culture. -Monitor CXR + CBC + FIO2 and cultures. -Diuresis to improve lung compliance.  Sepsis with Kleb  bacteremia.BLD Cult 5/27 remains -ve, Urine Cult 5/27 -ve and Sput 5/17 -ve -Start on Merop, d/c Vanc and Cefep -Follow with ID consult   PAF. ECHO 12/2017 LV EF 20-25%  -Currently in NSR.  GI bleeding. -ve endoscopy -PPI  DM -Glycemic control.  Sacral decub St/ III -Wound care  Anemia -Keep HB > 7 gm/dl  DNR  DVT & GI prophylaxis.Continue with supportive care Family was updated at the bedside.  Critical care time 35 min

## 2018-02-25 NOTE — Clinical Social Work Note (Signed)
Boneta Lucks with [facility], is taking medicaid information from patient's mother and having her corporate office review. If everything is accepted, patient may be able to transfer as soon as later tomorrow. York Spaniel MSW,LCSW [phone]

## 2018-02-25 NOTE — Progress Notes (Signed)
[facility] INFECT[facility] PROGRESS NOTE Date of Admission:  01/06/2018     ID: Tommy Mathews is a 57 y.o. male with fever Principal Problem:   Severe sepsis with septic shock ([place]) Active Problems:   GI bleed   Acute respiratory failure with hypoxia (HCC)   Multifocal pneumonia   UTI (urinary tract infection)   AKI (acute kidney injury) (Tommy Mathews)   Ileus (HCC)   Diabetes (Tommy Mathews)   Pressure injury of skin   Subjective: Was off abx for 10 days but bx + Klebsiella. Remains on vent not on pressors.  ROS  Unable to obtain Medications:  Antibiotics Given (last 72 hours)    Date/Time Action Medication Dose Rate   02/24/18 1300 New Bag/Given   vancomycin (VANCOCIN) IVPB 1000 mg/200 mL premix 1,000 mg 200 mL/hr   02/24/18 1502 New Bag/Given   ceFEPIme (MAXIPIME) 2 g in sodium chloride 0.9 % 100 mL IVPB 2 g 200 mL/hr   02/24/18 1724 New Bag/Given   vancomycin (VANCOCIN) 1,250 mg in sodium chloride 0.9 % 250 mL IVPB 1,250 mg 166.7 mL/hr   02/24/18 2113 New Bag/Given   ceFEPIme (MAXIPIME) 2 g in sodium chloride 0.9 % 100 mL IVPB 2 g 200 mL/hr   02/25/18 0213 New Bag/Given   vancomycin (VANCOCIN) 1,250 mg in sodium chloride 0.9 % 250 mL IVPB 1,250 mg 166.7 mL/hr   02/25/18 0556 New Bag/Given   ceFEPIme (MAXIPIME) 2 g in sodium chloride 0.9 % 100 mL IVPB 2 g 200 mL/hr   02/25/18 1420 New Bag/Given   meropenem (MERREM) 1 g in sodium chloride 0.9 % 100 mL IVPB 1 g 200 mL/hr     . acetylcysteine  2 mL Nebulization Q8H  . budesonide (PULMICORT) nebulizer solution  0.5 mg Nebulization BID  . chlorhexidine gluconate (MEDLINE KIT)  15 mL Mouth Rinse BID  . digoxin  0.25 mg Per Tube Daily  . enoxaparin (LOVENOX) injection  40 mg Subcutaneous Q12H  . free water  150 mL Per Tube Q8H  . furosemide  20 mg Intravenous Q6H  . gabapentin  200 mg Per Tube Q12H  . insulin aspart  0-20 Units Subcutaneous Q4H  . insulin glargine  20 Units Subcutaneous QHS  . ipratropium-albuterol  3 mL  Nebulization Q4H  . lisinopril  10 mg Per Tube Daily  . magnesium oxide  400 mg Per Tube BID  . mouth rinse  15 mL Mouth Rinse 10 times per day  . metoprolol tartrate  12.5 mg Per Tube Q6H  . nutrition supplement (JUVEN)  1 packet Per Tube BID  . nystatin  5 mL Oral QID  . pantoprazole sodium  40 mg Per Tube Daily  . valproic acid  750 mg Per Tube Daily    Objective: Vital signs in last 24 hours: Temp:  [98.6 F (37 C)-101 F (38.3 C)] 98.6 F (37 C) (05/28 0800) Pulse Rate:  [90-110] 95 (05/28 1600) Resp:  [16-36] 36 (05/28 1600) BP: (114-137)/(59-80) 124/59 (05/28 1600) SpO2:  [88 %-98 %] 92 % (05/28 1600) FiO2 (%):  [45 %] 45 % (05/28 1550) Weight:  [107.1 kg (236 lb 1.8 oz)-171.5 kg (378 lb 1.4 oz)] 107.1 kg (236 lb 1.8 oz) (05/28 1638) Constitutional: lethargic, HENT: trach in place, anicteric Mouth/Throat: Oropharynx is clear and moist.  Cardiovascular: Normal rate, regular rhythm and normal heart sounds.  Pulmonary/Chest: rhonchi bil Abdominal: Soft. Bowel sounds are normal. He exhibits no distension. There is no tenderness.  PEG Site wnl Lymphadenopathy: He  has no cervical adenopathy.  Neurological: lethargic Skin: Skin is warm and dry. No rash noted. No erythema.  Psychiatric: trached PICC RUE WNL    Lab Results Recent Labs    02/24/18 0528 02/25/18 0425  WBC 8.6 10.9*  HGB 9.4* 8.4*  HCT 29.8* 26.1*  NA 138 136  K 4.4 4.1  CL 92* 95*  CO2 40* 36*  BUN 20 24*  CREATININE 0.71 0.64    Microbiology: Results for orders placed or performed during the hospital encounter of 01/06/18  Blood Culture (routine x 2)     Status: None   Collection Time: 01/06/18  7:44 PM  Result Value Ref Range Status   Specimen Description BLOOD BLOOD LEFT WRIST  Final   Special Requests   Final    BOTTLES DRAWN AEROBIC AND ANAEROBIC Blood Culture adequate volume   Culture   Final    NO GROWTH 5 DAYS Performed at [facility], [address], [place], [place]  [address]    Report Status 01/11/2018 FINAL  Final  Urine culture     Status: Abnormal   Collection Time: 01/06/18  7:44 PM  Result Value Ref Range Status   Specimen Description   Final    URINE, RANDOM Performed at [facility], [address] [place], [place] [address]    Special Requests   Final    NONE Performed at [facility], [address] [place], [place] [address]    Culture (A)  Final    10,000 COLONIES/mL STAPHYLOCOCCUS SPECIES (COAGULASE NEGATIVE) CALL MICROBIOLOGY LAB IF SENSITIVITIES ARE REQUIRED. Performed at [facility], [place] [address] [place], [place] [address]    Report Status 01/08/2018 FINAL  Final  MRSA PCR Screening     Status: Abnormal   Collection Time: 01/06/18 11:51 PM  Result Value Ref Range Status   MRSA by PCR POSITIVE (A) NEGATIVE Final    Comment:        The GeneXpert MRSA Assay (FDA approved for NASAL specimens only), is one component of a comprehensive MRSA colonization surveillance program. It is not intended to diagnose MRSA infection nor to guide or monitor treatment for MRSA infections. RESULT CALLED TO, READ BACK BY AND VERIFIED WITH: BETH BUONO 01/07/18 @ 0121  [place]   Performed at [facility], [address] [place], [place] [address]   Blood Culture (routine x 2)     Status: None   Collection Time: 01/07/18  7:19 AM  Result Value Ref Range Status   Specimen Description BLOOD RIGHT HAND  Final   Special Requests   Final    BOTTLES DRAWN AEROBIC AND ANAEROBIC Blood Culture results may not be optimal due to an inadequate volume of blood received in culture bottles   Culture   Final    NO GROWTH 5 DAYS Performed at [facility], [address], [place], [place] [address]    Report Status 01/12/2018 FINAL  Final  Culture, respiratory (NON-Expectorated)     Status: None   Collection Time: 01/07/18 12:28 PM  Result Value Ref Range Status   Specimen Description   Final    TRACHEAL  ASPIRATE Performed at [facility], [address], [place], [place] [address]    Special Requests   Final    Normal Performed at [facility], [address] [place],  [address]    Gram Stain   Final    MODERATE WBC PRESENT,BOTH PMN AND MONONUCLEAR RARE Lonell Grandchild  POSITIVE COCCI RARE YEAST    Culture   Final    Consistent with normal respiratory flora. Performed at [facility], [place] [address] [place], [place] [address]    Report Status 01/09/2018 FINAL  Final  Culture, expectorated sputum-assessment     Status: None   Collection Time: 01/18/18  5:07 PM  Result Value Ref Range Status   Specimen Description ENDOTRACHEAL  Final   Special Requests NONE  Final   Sputum evaluation   Final    THIS SPECIMEN IS ACCEPTABLE FOR SPUTUM CULTURE Performed at [facility], [address] [place], [place] [address]    Report Status 01/18/2018 FINAL  Final  Culture, respiratory (NON-Expectorated)     Status: None   Collection Time: 01/18/18  5:07 PM  Result Value Ref Range Status   Specimen Description   Final    ENDOTRACHEAL Performed at [facility], [address], [place], [place] [address]    Special Requests   Final    NONE Reflexed from [id number] Performed at [facility], [address] [place], [place] [address]    Gram Stain   Final    MODERATE WBC PRESENT,BOTH PMN AND MONONUCLEAR NO SQUAMOUS EPITHELIAL CELLS SEEN RARE BUDDING YEAST SEEN    Culture   Final    RARE Consistent with normal respiratory flora. Performed at [facility], [place] [address] [place], [place] [address]    Report Status 01/21/2018 FINAL  Final  MRSA PCR Screening     Status: Abnormal   Collection Time: 01/18/18  8:26 PM  Result Value Ref Range Status   MRSA by PCR POSITIVE (A) NEGATIVE Final    Comment:        The GeneXpert MRSA Assay (FDA approved for NASAL specimens only), is one component of a comprehensive MRSA  colonization surveillance program. It is not intended to diagnose MRSA infection nor to guide or monitor treatment for MRSA infections. RESULT CALLED TO, READ BACK BY AND VERIFIED WITH: ANGELA BREHUN AT 2200 ON 01/18/18 RWW Performed at [facility], [address] [place], [place] [address]   CULTURE, BLOOD (ROUTINE X 2) w Reflex to ID Panel     Status: Abnormal   Collection Time: 01/31/18  8:17 AM  Result Value Ref Range Status   Specimen Description   Final    BLOOD LEFT HAND Performed at [facility], [place] [address], [place], [place] [address]    Special Requests   Final    BOTTLES DRAWN AEROBIC AND ANAEROBIC Blood Culture results may not be optimal due to an excessive volume of blood received in culture bottles Performed at [facility], [address] [place], [place] [address]    Culture  Setup Time   Final    GRAM NEGATIVE RODS AEROBIC BOTTLE ONLY CRITICAL RESULT CALLED TO, READ BACK BY AND VERIFIED WITH: Yamila Cragin BESANTI ON 02/05/18 AT 0228 JAG Performed at [facility], [place] [address], [place], [place] [address]    Culture KLEBSIELLA PNEUMONIAE (A)  Final   Report Status 02/07/2018 FINAL  Final   Organism ID, Bacteria KLEBSIELLA PNEUMONIAE  Final      Susceptibility   Klebsiella pneumoniae - MIC*    AMPICILLIN >=32 RESISTANT Resistant     CEFAZOLIN <=4 SENSITIVE Sensitive     CEFEPIME <=1 SENSITIVE Sensitive     CEFTAZIDIME <=1 SENSITIVE Sensitive     CEFTRIAXONE <=1 SENSITIVE Sensitive     CIPROFLOXACIN <=  0.25 SENSITIVE Sensitive     GENTAMICIN <=1 SENSITIVE Sensitive     IMIPENEM <=0.25 SENSITIVE Sensitive     TRIMETH/SULFA <=20 SENSITIVE Sensitive     AMPICILLIN/SULBACTAM >=32 RESISTANT Resistant     PIP/TAZO 16 SENSITIVE Sensitive     Extended ESBL NEGATIVE Sensitive     * KLEBSIELLA PNEUMONIAE  Blood Culture ID Panel (Reflexed)     Status: Abnormal   Collection Time: 01/31/18  8:17 AM  Result Value Ref Range Status    Enterococcus species NOT DETECTED NOT DETECTED Final   Listeria monocytogenes NOT DETECTED NOT DETECTED Final   Staphylococcus species NOT DETECTED NOT DETECTED Final   Staphylococcus aureus NOT DETECTED NOT DETECTED Final   Streptococcus species NOT DETECTED NOT DETECTED Final   Streptococcus agalactiae NOT DETECTED NOT DETECTED Final   Streptococcus pneumoniae NOT DETECTED NOT DETECTED Final   Streptococcus pyogenes NOT DETECTED NOT DETECTED Final   Acinetobacter baumannii NOT DETECTED NOT DETECTED Final   Enterobacteriaceae species DETECTED (A) NOT DETECTED Final    Comment: Enterobacteriaceae represent a large family of gram-negative bacteria, not a single organism. CRITICAL RESULT CALLED TO, READ BACK BY AND VERIFIED WITH: Julia Alkhatib BESANTI ON 02/05/18 AT 0228 JAG    Enterobacter cloacae complex NOT DETECTED NOT DETECTED Final   Escherichia coli NOT DETECTED NOT DETECTED Final   Klebsiella oxytoca NOT DETECTED NOT DETECTED Final   Klebsiella pneumoniae DETECTED (A) NOT DETECTED Final    Comment: CRITICAL RESULT CALLED TO, READ BACK BY AND VERIFIED WITH: Raniyah Curenton BESANTI ON 02/05/18 AT 0228 JAG    Proteus species NOT DETECTED NOT DETECTED Final   Serratia marcescens NOT DETECTED NOT DETECTED Final   Carbapenem resistance NOT DETECTED NOT DETECTED Final   Haemophilus influenzae NOT DETECTED NOT DETECTED Final   Neisseria meningitidis NOT DETECTED NOT DETECTED Final   Pseudomonas aeruginosa NOT DETECTED NOT DETECTED Final   Candida albicans NOT DETECTED NOT DETECTED Final   Candida glabrata NOT DETECTED NOT DETECTED Final   Candida krusei NOT DETECTED NOT DETECTED Final   Candida parapsilosis NOT DETECTED NOT DETECTED Final   Candida tropicalis NOT DETECTED NOT DETECTED Final    Comment: Performed at [facility], [address] [place], [place] [address]  Urine Culture     Status: None   Collection Time: 01/31/18  8:30 AM  Result Value Ref Range Status   Specimen  Description   Final    URINE, RANDOM Performed at [facility], [address] [place], [place] [address]    Special Requests   Final    NONE Performed at [facility], [address], [place], [place] [address]    Culture   Final    NO GROWTH Performed at [facility], [place] [address], [place], [place] [address]    Report Status 02/01/2018 FINAL  Final  CULTURE, BLOOD (ROUTINE X 2) w Reflex to ID Panel     Status: Abnormal   Collection Time: 01/31/18  8:53 AM  Result Value Ref Range Status   Specimen Description   Final    BLOOD BLOOD RIGHT HAND Performed at [facility], [address], [place], [place] [address]    Special Requests   Final    BOTTLES DRAWN AEROBIC AND ANAEROBIC Blood Culture adequate volume Performed at [facility], [address] [place], [place] [address]    Culture  Setup Time   Final    GRAM NEGATIVE RODS AEROBIC BOTTLE ONLY CRITICAL RESULT CALLED  TO, READ BACK BY AND VERIFIED WITH: Isobel Eisenhuth BESANTI AT 8099 ON 02/05/18 [place]. Performed at [facility], [address] [place], [place] [address]    Culture (A)  Final    KLEBSIELLA PNEUMONIAE SUSCEPTIBILITIES PERFORMED ON PREVIOUS CULTURE WITHIN THE LAST 5 DAYS. Performed at [facility], [place] [address] [place], [place] [address]    Report Status 02/07/2018 FINAL  Final  Culture, respiratory (NON-Expectorated)     Status: None   Collection Time: 01/31/18 10:47 AM  Result Value Ref Range Status   Specimen Description   Final    TRACHEAL ASPIRATE Performed at [facility], [address] [place], [place] [address]    Special Requests   Final    NONE Performed at [facility], [address] [place], [place] [address]    Gram Stain   Final    MODERATE WBC PRESENT, PREDOMINANTLY PMN MODERATE GRAM POSITIVE COCCI MODERATE GRAM POSITIVE RODS    Culture   Final    Consistent with normal respiratory flora. Performed at  [facility], [place] [address] [place], [place] [address]    Report Status 02/02/2018 FINAL  Final  CULTURE, BLOOD (ROUTINE X 2) w Reflex to ID Panel     Status: None   Collection Time: 02/04/18  5:41 PM  Result Value Ref Range Status   Specimen Description BLOOD BLOOD LEFT HAND  Final   Special Requests   Final    BOTTLES DRAWN AEROBIC AND ANAEROBIC Blood Culture adequate volume   Culture   Final    NO GROWTH 5 DAYS Performed at [facility], [address] [place], [place] [address]    Report Status 02/09/2018 FINAL  Final  CULTURE, BLOOD (ROUTINE X 2) w Reflex to ID Panel     Status: None   Collection Time: 02/04/18  8:50 PM  Result Value Ref Range Status   Specimen Description BLOOD LEFT HAND  Final   Special Requests   Final    BOTTLES DRAWN AEROBIC AND ANAEROBIC Blood Culture adequate volume   Culture   Final    NO GROWTH 5 DAYS Performed at [facility], [address] [place], [place] [address]    Report Status 02/10/2018 FINAL  Final  MRSA PCR Screening     Status: None   Collection Time: 02/05/18  9:38 AM  Result Value Ref Range Status   MRSA by PCR NEGATIVE NEGATIVE Final    Comment:        The GeneXpert MRSA Assay (FDA approved for NASAL specimens only), is one component of a comprehensive MRSA colonization surveillance program. It is not intended to diagnose MRSA infection nor to guide or monitor treatment for MRSA infections. Performed at [facility], [address] [place], [place] [address]   Culture, respiratory (NON-Expectorated)     Status: None   Collection Time: 02/10/18 12:02 PM  Result Value Ref Range Status   Specimen Description   Final    TRACHEAL ASPIRATE Performed at [facility], [address] [place], [place] [address]    Special Requests   Final    NONE Performed at [facility], [address] [place], [place] [address]    Gram Stain   Final    MODERATE WBC PRESENT,BOTH  PMN AND MONONUCLEAR NO ORGANISMS SEEN    Culture   Final    Consistent with normal respiratory flora. Performed at [facility], [place] [address],  [place], [place] [address]    Report Status 02/12/2018 FINAL  Final  Culture, respiratory (NON-Expectorated)     Status: None   Collection Time: 02/13/18  1:42 PM  Result Value Ref Range Status   Specimen Description   Final    TRACHEAL ASPIRATE Performed at [facility], [address] [place], [place] [address]    Special Requests   Final    NONE Performed at [facility], [address] [place], [place] [address]    Gram Stain   Final    ABUNDANT WBC PRESENT,BOTH PMN AND MONONUCLEAR RARE YEAST RARE GRAM VARIABLE ROD Performed at [facility], [place] [address] [place], [place] [address]    Culture FEW KLEBSIELLA PNEUMONIAE  Final   Report Status 02/16/2018 FINAL  Final   Organism ID, Bacteria KLEBSIELLA PNEUMONIAE  Final      Susceptibility   Klebsiella pneumoniae - MIC*    AMPICILLIN >=32 RESISTANT Resistant     CEFAZOLIN <=4 SENSITIVE Sensitive     CEFEPIME <=1 SENSITIVE Sensitive     CEFTAZIDIME <=1 SENSITIVE Sensitive     CEFTRIAXONE <=1 SENSITIVE Sensitive     CIPROFLOXACIN <=0.25 SENSITIVE Sensitive     GENTAMICIN <=1 SENSITIVE Sensitive     IMIPENEM <=0.25 SENSITIVE Sensitive     TRIMETH/SULFA <=20 SENSITIVE Sensitive     AMPICILLIN/SULBACTAM 16 INTERMEDIATE Intermediate     PIP/TAZO 16 SENSITIVE Sensitive     Extended ESBL NEGATIVE Sensitive     * FEW KLEBSIELLA PNEUMONIAE  Culture, respiratory (NON-Expectorated)     Status: None   Collection Time: 02/22/18  1:25 PM  Result Value Ref Range Status   Specimen Description   Final    TRACHEAL ASPIRATE Performed at [facility], [address], [place], [place] [address]    Special Requests   Final    NONE Performed at [facility], [address] [place], [place] [address]    Gram Stain   Final    MODERATE WBC  PRESENT, PREDOMINANTLY PMN RARE SQUAMOUS EPITHELIAL CELLS PRESENT MODERATE GRAM POSITIVE RODS MODERATE GRAM POSITIVE COCCI IN PAIRS    Culture   Final    Consistent with normal respiratory flora. Performed at [facility], [place] [address], [place], [place] [address]    Report Status 02/24/2018 FINAL  Final  Urine Culture     Status: None   Collection Time: 02/22/18  1:34 PM  Result Value Ref Range Status   Specimen Description   Final    URINE, RANDOM Performed at [facility], [address] [place], [place] [address]    Special Requests   Final    NONE Performed at [facility], [address] [place], [place] [address]    Culture   Final    NO GROWTH Performed at [facility], [place] [address] [place], [place] [address]    Report Status 02/23/2018 FINAL  Final  Culture, respiratory (NON-Expectorated)     Status: None (Preliminary result)   Collection Time: 02/24/18  5:10 AM  Result Value Ref Range Status   Specimen Description   Final    TRACHEAL ASPIRATE Performed at [facility], [address] [place], [place] [address]    Special Requests   Final    NONE Performed at [facility], [place] [place],  [address]    Gram Stain   Final    ABUNDANT WBC PRESENT, PREDOMINANTLY PMN FEW SQUAMOUS EPITHELIAL  CELLS PRESENT ABUNDANT GRAM POSITIVE COCCI IN PAIRS IN CHAINS MODERATE GRAM NEGATIVE RODS RARE GRAM POSITIVE RODS    Culture   Final    CULTURE REINCUBATED FOR BETTER GROWTH Performed at [facility], [place] [address], [place], [place] [address]    Report Status PENDING  Incomplete  CULTURE, BLOOD (ROUTINE X 2) w Reflex to ID Panel     Status: None (Preliminary result)   Collection Time: 02/24/18  5:24 AM  Result Value Ref Range Status   Specimen Description BLOOD L HAND  Final   Special Requests   Final    BOTTLES DRAWN AEROBIC AND ANAEROBIC Blood Culture adequate volume   Culture   Final     NO GROWTH 1 DAY Performed at [facility], [address] [place], [place] [address]    Report Status PENDING  Incomplete  CULTURE, BLOOD (ROUTINE X 2) w Reflex to ID Panel     Status: None (Preliminary result)   Collection Time: 02/24/18  5:27 AM  Result Value Ref Range Status   Specimen Description   Final    BLOOD RIGHT HAND Performed at [facility], [place] [address], [place], [place] [address]    Special Requests   Final    BOTTLES DRAWN AEROBIC AND ANAEROBIC Blood Culture adequate volume Performed at [facility], [address] [place], [place] [address]    Culture  Setup Time   Final    ANAEROBIC BOTTLE ONLY CRITICAL RESULT CALLED TO, READ BACK BY AND VERIFIED WITH: C/ MATT MCBANE '@0317'  02/25/18 [facility] GRAM NEGATIVE RODS Performed at [facility], [place] [address], [place],  [address]    Culture GRAM NEGATIVE RODS  Final   Report Status PENDING  Incomplete  Blood Culture ID Panel (Reflexed)     Status: Abnormal   Collection Time: 02/24/18  5:27 AM  Result Value Ref Range Status   Enterococcus species NOT DETECTED NOT DETECTED Final   Listeria monocytogenes NOT DETECTED NOT DETECTED Final   Staphylococcus species NOT DETECTED NOT DETECTED Final   Staphylococcus aureus NOT DETECTED NOT DETECTED Final   Streptococcus species NOT DETECTED NOT DETECTED Final   Streptococcus agalactiae NOT DETECTED NOT DETECTED Final   Streptococcus pneumoniae NOT DETECTED NOT DETECTED Final   Streptococcus pyogenes NOT DETECTED NOT DETECTED Final   Acinetobacter baumannii NOT DETECTED NOT DETECTED Final   Enterobacteriaceae species DETECTED (A) NOT DETECTED Final    Comment: Enterobacteriaceae represent a large family of gram-negative bacteria, not a single organism.   Enterobacter cloacae complex NOT DETECTED NOT DETECTED Final   Escherichia coli NOT DETECTED NOT DETECTED Final   Klebsiella oxytoca NOT DETECTED NOT DETECTED Final   Klebsiella pneumoniae  DETECTED (A) NOT DETECTED Final    Comment: CRITICAL RESULT CALLED TO, READ BACK BY AND VERIFIED WITH: MATT MCBANE AT 0317 ON 02/25/18 FLC.    Proteus species NOT DETECTED NOT DETECTED Final   Serratia marcescens NOT DETECTED NOT DETECTED Final   Carbapenem resistance NOT DETECTED NOT DETECTED Final   Haemophilus influenzae NOT DETECTED NOT DETECTED Final   Neisseria meningitidis NOT DETECTED NOT DETECTED Final   Pseudomonas aeruginosa NOT DETECTED NOT DETECTED Final   Candida albicans NOT DETECTED NOT DETECTED Final   Candida glabrata NOT DETECTED NOT DETECTED Final   Candida krusei NOT DETECTED NOT DETECTED Final   Candida parapsilosis NOT DETECTED NOT DETECTED Final   Candida tropicalis NOT DETECTED NOT DETECTED Final    Comment: Performed  at [facility], [address] [place], [place] [address]    Studies/Results: Dg Abd 1 View  Result Date: 02/24/2018 CLINICAL DATA:  Hypoactive bowel sounds, clogged feeding tube EXAM: ABDOMEN - 1 VIEW COMPARISON:  01/27/2018 FINDINGS: Gastrostomy device projects in the left upper abdomen. Mild gaseous distention of the stomach. Small bowel is nondilated. Moderate fecal material in the proximal colon, rectum decompressed. Regional bones unremarkable. No abnormal abdominal calcifications. IMPRESSION: 1. Nonobstructive bowel gas pattern with moderate proximal colonic fecal material. 2. Gastrostomy catheter projects in expected location. Electronically Signed   By: Lucrezia Europe M.D.   On: 02/24/2018 11:28   Dg Chest Port 1 View  Result Date: 02/25/2018 CLINICAL DATA:  Pneumonia. EXAM: PORTABLE CHEST 1 VIEW COMPARISON:  02/24/2018. FINDINGS: Tracheostomy tube in stable position. PICC line tip at cavoatrial junction. Stable cardiomegaly. Diffuse bilateral pulmonary infiltrates/edema again noted. No interim change. No pleural effusion or pneumothorax. IMPRESSION: 1. Tracheostomy tube in stable position. PICC line noted with tip at cavoatrial  junction. 2. Stable cardiomegaly. Diffuse bilateral pulmonary infiltrates/edema without interim change. No pleural effusion or pneumothorax. Electronically Signed   By: Marcello Moores  Register   On: 02/25/2018 06:47   Dg Chest Port 1 View  Result Date: 02/24/2018 CLINICAL DATA:  Respiratory failure.  Follow-up exam. EXAM: PORTABLE CHEST 1 VIEW COMPARISON:  02/22/2018 and older studies. FINDINGS: Irregular bilateral heterogeneous interstitial lung opacities are similar to prior exams allowing for differences in patient positioning and technique. No convincing new lung abnormalities. No visualized pleural effusions.  No pneumothorax. Tracheostomy tube and right PICC are stable. IMPRESSION: 1. No change in lung aeration. Persistent bilateral heterogeneous interstitial lung opacities suspicious for ARDS given the limited interval change. Electronically Signed   By: Lajean Manes M.D.   On: 02/24/2018 08:09    Assessment/Plan: Tommy Mathews is a 57 y.o. 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 He has had a prolonged admission and is now trached and has PEG. He was treated with IV abx until 4/15 then again 4/20-21. Was then off of abx until developed fever 5/2 with wbc up some to 12. CXR however did not show any new opacities but has increasing resp requirements and increased sputum.  PICC line RUE changed. Prior sputum cx with Nml flora 4/20. Prior bcx neg. FU BCX and sputum pending.  5/6- defervesced, bcx neg, sputum cx negative. WBC down 17->12. Day 4 of vanco and mero. Imaging shows no change in severe patchy opacities bilaterally.  5/8 had fever and increased wbc. cx now + klebsiellafrom 5/3 5/10 - no fevers, cx with Kleb sensitiive to multple abx  5/13 - stop date today for abx. No fever wbc up some to 16.  5/14 - wbc down a little, no fevers, still with sputum - cx pending 5/15- no fevers, wbc remains a  little elevated. Sputum cx with nml flora.  5/28 bacteremia recurred likely pulm source.  After being off abx from 5/16- 5/26.   CXR stable  Recommendations Cont meropenem pending cx results.  Will plan on a 14 day course at this point- can narrow abx based on sensitivities.  Continue to work on clearance of secretions.  Thank you very much for the consult. Will follow with you.  Leonel Ramsay   02/25/2018, 5:23 PM

## 2018-02-25 NOTE — Progress Notes (Signed)
[facility] [place] at [facility]   PATIENT NAME: Tommy Mathews    MR#:  [id number]  DATE OF BIRTH:  1961-08-25  SUBJECTIVE:   Patient started on cefepime and vancomycin yesterday for pneumonia  REVIEW OF SYSTEMS:  Unable to obtain Tolerating Diet: yes       DRUG ALLERGIES:  No Known Allergies  VITALS:  Blood pressure 124/65, pulse 98, temperature 99.7 F (37.6 C), resp. rate 18, height  (1.778 m), weight (!) 171.5 kg (378 lb 1.4 oz), SpO2 92 %.  PHYSICAL EXAMINATION:  Constitutional: Critically ill-appearing. No distress. HENT: Normocephalic. Marland Kitchen Oropharynx is clear and moist.  Tracheostomy site is intact Eyes: Conjunctivae and EOM are normal. , no scleral icterus.  Neck: Normal ROM. Neck supple. No JVD. No tracheal deviation. CVS: RRR, S1/S2 +, no murmurs, no gallops, no carotid bruit.  Pulmonary: Coarse breath sounds throughout lung fields Abdominal: Soft. BS +,  no distension, tenderness, rebound or guarding.  Musculoskeletal:1+ LEE/hands with mild edema this morning as well Neuro: Opens eyes however overall lethargic Skin: Stage II decubitus ulcer buttock psychiatric: Lethargic    LABORATORY PANEL:   CBC Recent Labs  Lab 02/25/18 0425  WBC 10.9*  HGB 8.4*  HCT 26.1*  PLT 151   ------------------------------------------------------------------------------------------------------------------  Chemistries  Recent Labs  Lab 02/25/18 0425  NA 136  K 4.1  CL 95*  CO2 36*  GLUCOSE 155*  BUN 24*  CREATININE 0.64  CALCIUM 8.1*  MG 1.7  AST 50*  ALT 33  ALKPHOS 59  BILITOT 0.9   ------------------------------------------------------------------------------------------------------------------  Cardiac Enzymes No results for input(s): TROPONINI in the last 168 hours. ------------------------------------------------------------------------------------------------------------------  RADIOLOGY:  Dg Abd 1 View  Result Date:  02/24/2018 CLINICAL DATA:  Hypoactive bowel sounds, clogged feeding tube EXAM: ABDOMEN - 1 VIEW COMPARISON:  01/27/2018 FINDINGS: Gastrostomy device projects in the left upper abdomen. Mild gaseous distention of the stomach. Small bowel is nondilated. Moderate fecal material in the proximal colon, rectum decompressed. Regional bones unremarkable. No abnormal abdominal calcifications. IMPRESSION: 1. Nonobstructive bowel gas pattern with moderate proximal colonic fecal material. 2. Gastrostomy catheter projects in expected location. Electronically Signed   By: Corlis Leak M.D.   On: 02/24/2018 11:28   Dg Chest Port 1 View  Result Date: 02/25/2018 CLINICAL DATA:  Pneumonia. EXAM: PORTABLE CHEST 1 VIEW COMPARISON:  02/24/2018. FINDINGS: Tracheostomy tube in stable position. PICC line tip at cavoatrial junction. Stable cardiomegaly. Diffuse bilateral pulmonary infiltrates/edema again noted. No interim change. No pleural effusion or pneumothorax. IMPRESSION: 1. Tracheostomy tube in stable position. PICC line noted with tip at cavoatrial junction. 2. Stable cardiomegaly. Diffuse bilateral pulmonary infiltrates/edema without interim change. No pleural effusion or pneumothorax. Electronically Signed   By: Maisie Fus  Register   On: 02/25/2018 06:47   Dg Chest Port 1 View  Result Date: 02/24/2018 CLINICAL DATA:  Respiratory failure.  Follow-up exam. EXAM: PORTABLE CHEST 1 VIEW COMPARISON:  02/22/2018 and older studies. FINDINGS: Irregular bilateral heterogeneous interstitial lung opacities are similar to prior exams allowing for differences in patient positioning and technique. No convincing new lung abnormalities. No visualized pleural effusions.  No pneumothorax. Tracheostomy tube and right PICC are stable. IMPRESSION: 1. No change in lung aeration. Persistent bilateral heterogeneous interstitial lung opacities suspicious for ARDS given the limited interval change. Electronically Signed   By: Amie Portland M.D.   On:  02/24/2018 08:09     ASSESSMENT AND PLAN:   57 year old male with history of TBI admitted for severe respiratory  failure and severe shock with pneumonia.  1.  Severe hypoxic and hypercapnic respiratory failure/ARDS: Patient remains on vent support via tracheostomy Management as per intensivist  2.  Ventilator associated pneumonia with sepsis and Klebsiella: Patient had completed antibiotics Had another fever on May 27 and therefore vancomycin and cefepime started for ventilator associated pneumonia  3.  PAF: Patient is currently in sinus rhythm Continue digoxin and metoprolol Amiodarone discontinued due to worsening of respiratory status  4.  Acute on chronic systolic heart failure: Currently euvolemic  continue Lasix as needed, lisinopril and metoprolol  5.  TBI/depression: Patient evaluated by psychiatry Continue valproic acid   6.  Recent GI bleed on PPI with negative endoscopy Monitor hemoglobin  7.  Acute kidney injury in the setting of sepsis which is resolved  8.  Diabetes: Continue Lantus with sliding scale  9.  Stage II decubitus buttock ulcer: Continue wound care  10.  Nutrition: On tube feeds     Awaiting placement/Medicaid   CODE STATUS: DNR  TOTAL TIME TAKING CARE OF THIS PATIENT: 21 minutes.     POSSIBLE D/C ??, DEPENDING ON CLINICAL CONDITION.   Koy Lamp M.D on 02/25/2018 at 8:57 AM  Between 7am to 6pm - Pager - [phone] After 6pm go to [email or web address] - password [email or web address]  [facility]  Office  [phone]  CC: Primary care physician; Marcina Millard, MD  Note: This dictation was prepared with Dragon dictation along with smaller phrase technology. Any transcriptional errors that result from this process are unintentional.

## 2018-02-25 NOTE — Progress Notes (Addendum)
Pharmacy Electrolyte Monitoring Consult:  Pharmacy consulted to assist in monitoring and replacing electrolytes in this 57 y.o. male admitted on 01/06/2018 with Respiratory Distress Patient receiving Magnesium oxide 400 mg PO BID.  Labs:  Sodium (mmol/L)  Date Value  02/25/2018 136   Potassium (mmol/L)  Date Value  02/25/2018 4.1   Magnesium (mg/dL)  Date Value  16/07/9603 1.7   Phosphorus (mg/dL)  Date Value  54/06/8118 2.7   Calcium (mg/dL)  Date Value  14/78/2956 8.1 (L)   Albumin (g/dL)  Date Value  21/30/8657 1.8 (L)   Corrected Calcium: 9.86  Assessment/Plan: In the setting of AF, will replace magnesium with magnesium sulfate 1 g iv once and f/u AM labs.   Pharmacy will continue to monitor and adjust per consult.   Mirna Mires  Pharmacy Student 02/25/2018 2:22 PM

## 2018-02-25 NOTE — Clinical Social Work Note (Signed)
Boneta Lucks with [facility] will be meeting patient's mother today to do begin [place] medicaid paperwork at 12. York Spaniel MSW,LCSW [phone]

## 2018-02-25 NOTE — Progress Notes (Signed)
Pharmacy Antibiotic Note  Tommy Mathews is a 57 y.o. male being treated for possible HCAP/ pneumonia. Patient has had extended/complicated stay at hospital. Pharmacy has been consulted for meropenem. Patient has recurrent Klebsiella bacteremia.   Plan: Meropenem 1 g iv q 8 hours. MD is aware of drug interaction with VPA. Patient does not have a h/o seizure disorder. VPA likely being used as a mood stabilizer or for neuropathy.    Height:  (177.8 cm) Weight: (!) 378 lb 1.4 oz (171.5 kg) IBW/kg (Calculated) : 73  Adjusted Body Weight: 98kg   Temp (24hrs), Avg:99.3 F (37.4 C), Min:98.6 F (37 C), Max:101 F (38.3 C)  Recent Labs  Lab 02/19/18 0458 02/20/18 0427 02/21/18 0407 02/22/18 0622 02/22/18 1334 02/23/18 0611 02/24/18 0528 02/24/18 1226 02/24/18 1958 02/25/18 0425  WBC 9.5 9.7  --   --  7.8  --  8.6  --   --  10.9*  CREATININE 0.60*  --  0.68 0.73  --  0.64 0.71  --   --  0.64  LATICACIDVEN  --   --   --   --   --   --   --  0.7 1.0  --     Estimated Creatinine Clearance: 165.9 mL/min (by C-G formula based on SCr of 0.64 mg/dL).    No Known Allergies  Antimicrobials this admission: Vancomycin 5/3 >> 5/10, 5/27 >> 5/28 Meropenem 5/3 >> 5/10, meropenem 5/28 >> Anidulafungin 5/3 >> 5/6 Cefazolin 5/11 >> 5/13   Cefepime 5/27 >> 5/28  Microbiology results: 5/3 BCx: Klebsiella Pneumoniae 5/7 BCx: no growth  5/3, 5/25 UCx: NG  5/3 Tracheal Aspirate: mod GPC, GPR  4/20, 5/8 MRSA PCR: positive 5/16 Trach aspirate: Kleb pneumo 5/25 Trach aspirate: normal resp flora  5/27 Trach aspirate: GPC in pairs/chains, GNR 5/27 BCx: GNR, Klebsiella pneumoniae  Thank you for allowing pharmacy to be a part of this patient's care.  Valentina Gu, PharmD 02/25/2018 2:25 PM

## 2018-02-25 NOTE — Progress Notes (Signed)
PHARMACY - PHYSICIAN COMMUNICATION CRITICAL VALUE ALERT - BLOOD CULTURE IDENTIFICATION (BCID)  Tommy Mathews is an 57 y.o. male who presented to [facility] on 01/06/2018 with a chief complaint of   Assessment:  1/4 K. pneumoniae (include suspected source if known)  Name of physician (or Provider) Contacted: Arsenio Loader  Current antibiotics: vanc cefepime  Changes to prescribed antibiotics recommended:  n/a  Results for orders placed or performed during the hospital encounter of 01/06/18  Blood Culture ID Panel (Reflexed) (Collected: 01/31/2018  8:17 AM)  Result Value Ref Range   Enterococcus species NOT DETECTED NOT DETECTED   Listeria monocytogenes NOT DETECTED NOT DETECTED   Staphylococcus species NOT DETECTED NOT DETECTED   Staphylococcus aureus NOT DETECTED NOT DETECTED   Streptococcus species NOT DETECTED NOT DETECTED   Streptococcus agalactiae NOT DETECTED NOT DETECTED   Streptococcus pneumoniae NOT DETECTED NOT DETECTED   Streptococcus pyogenes NOT DETECTED NOT DETECTED   Acinetobacter baumannii NOT DETECTED NOT DETECTED   Enterobacteriaceae species DETECTED (A) NOT DETECTED   Enterobacter cloacae complex NOT DETECTED NOT DETECTED   Escherichia coli NOT DETECTED NOT DETECTED   Klebsiella oxytoca NOT DETECTED NOT DETECTED   Klebsiella pneumoniae DETECTED (A) NOT DETECTED   Proteus species NOT DETECTED NOT DETECTED   Serratia marcescens NOT DETECTED NOT DETECTED   Carbapenem resistance NOT DETECTED NOT DETECTED   Haemophilus influenzae NOT DETECTED NOT DETECTED   Neisseria meningitidis NOT DETECTED NOT DETECTED   Pseudomonas aeruginosa NOT DETECTED NOT DETECTED   Candida albicans NOT DETECTED NOT DETECTED   Candida glabrata NOT DETECTED NOT DETECTED   Candida krusei NOT DETECTED NOT DETECTED   Candida parapsilosis NOT DETECTED NOT DETECTED   Candida tropicalis NOT DETECTED NOT DETECTED    Tommy Mathews S 02/25/2018  3:20 AM

## 2018-02-25 NOTE — Progress Notes (Signed)
Pharmacy Lovenox Dosing  57 y.o. male admitted with Respiratory Distress . Patient ordered Lovenox 40 mg daily for VTE prophylaxis.   Filed Weights   02/23/18 0400 02/24/18 0400 02/25/18 0418  Weight: 236 lb 8.9 oz (107.3 kg) 229 lb 15 oz (104.3 kg) (!) 378 lb 1.4 oz (171.5 kg)    Body mass index is 54.25 kg/m.  Estimated Creatinine Clearance: 165.9 mL/min (by C-G formula based on SCr of 0.64 mg/dL).  Will adjust Lovenox dosing to 40 mg bid for weight > 100 kg and BMI > 40.  Luisa Hart D 02/25/2018 8:18 AM

## 2018-02-26 ENCOUNTER — Inpatient Hospital Stay: Payer: Medicaid Other

## 2018-02-26 LAB — HEMOGLOBIN AND HEMATOCRIT, BLOOD
HCT: 27.4 % — ABNORMAL LOW (ref 40.0–52.0)
HCT: 26.8 % — ABNORMAL LOW (ref 40.0–52.0)
HEMOGLOBIN: 8.4 g/dL — AB (ref 13.0–18.0)
Hemoglobin: 8.7 g/dL — ABNORMAL LOW (ref 13.0–18.0)

## 2018-02-26 LAB — BLOOD GAS, ARTERIAL
ACID-BASE EXCESS: 21.2 mmol/L — AB (ref 0.0–2.0)
Bicarbonate: 46.8 mmol/L — ABNORMAL HIGH (ref 20.0–28.0)
FIO2: 0.45
LHR: 18 {breaths}/min
O2 Saturation: 84.1 %
PCO2 ART: 60 mmHg — AB (ref 32.0–48.0)
PEEP/CPAP: 5 cmH2O
Patient temperature: 37
Pressure control: 14 cmH2O
pH, Arterial: 7.5 — ABNORMAL HIGH (ref 7.350–7.450)
pO2, Arterial: 44 mmHg — ABNORMAL LOW (ref 83.0–108.0)

## 2018-02-26 LAB — BASIC METABOLIC PANEL
ANION GAP: 6 (ref 5–15)
BUN: 29 mg/dL — ABNORMAL HIGH (ref 6–20)
CO2: 38 mmol/L — ABNORMAL HIGH (ref 22–32)
CREATININE: 0.66 mg/dL (ref 0.61–1.24)
Calcium: 8.5 mg/dL — ABNORMAL LOW (ref 8.9–10.3)
Chloride: 94 mmol/L — ABNORMAL LOW (ref 101–111)
Glucose, Bld: 139 mg/dL — ABNORMAL HIGH (ref 65–99)
Potassium: 3.8 mmol/L (ref 3.5–5.1)
SODIUM: 138 mmol/L (ref 135–145)

## 2018-02-26 LAB — CULTURE, RESPIRATORY W GRAM STAIN

## 2018-02-26 LAB — CALCIUM, IONIZED: CALCIUM, IONIZED, SERUM: 5.1 mg/dL (ref 4.5–5.6)

## 2018-02-26 LAB — GLUCOSE, CAPILLARY
GLUCOSE-CAPILLARY: 125 mg/dL — AB (ref 65–99)
GLUCOSE-CAPILLARY: 132 mg/dL — AB (ref 65–99)
GLUCOSE-CAPILLARY: 145 mg/dL — AB (ref 65–99)
GLUCOSE-CAPILLARY: 169 mg/dL — AB (ref 65–99)
Glucose-Capillary: 108 mg/dL — ABNORMAL HIGH (ref 65–99)
Glucose-Capillary: 180 mg/dL — ABNORMAL HIGH (ref 65–99)
Glucose-Capillary: 137 mg/dL — ABNORMAL HIGH (ref 65–99)

## 2018-02-26 LAB — CULTURE, RESPIRATORY: CULTURE: NORMAL

## 2018-02-26 LAB — MAGNESIUM: Magnesium: 1.8 mg/dL (ref 1.7–2.4)

## 2018-02-26 MED ORDER — MAGNESIUM SULFATE IN D5W 1-5 GM/100ML-% IV SOLN
1.0000 g | Freq: Once | INTRAVENOUS | Status: AC
  Administered 2018-02-26: 1 g via INTRAVENOUS
  Filled 2018-02-26: qty 100

## 2018-02-26 MED ORDER — POTASSIUM CHLORIDE 20 MEQ PO PACK
20.0000 meq | PACK | Freq: Once | ORAL | Status: AC
  Administered 2018-02-26: 20 meq
  Filled 2018-02-26: qty 1

## 2018-02-26 MED ORDER — DOCUSATE SODIUM 50 MG/5ML PO LIQD
50.0000 mg | Freq: Two times a day (BID) | ORAL | Status: DC
  Administered 2018-02-27 – 2018-03-19 (×29): 50 mg
  Filled 2018-02-26 (×34): qty 10

## 2018-02-26 MED ORDER — ENOXAPARIN SODIUM 40 MG/0.4ML ~~LOC~~ SOLN
40.0000 mg | SUBCUTANEOUS | Status: DC
  Administered 2018-02-27 – 2018-03-19 (×21): 40 mg via SUBCUTANEOUS
  Filled 2018-02-26 (×21): qty 0.4

## 2018-02-26 MED ORDER — FLUOXETINE HCL 20 MG/5ML PO SOLN
20.0000 mg | Freq: Every day | ORAL | Status: DC
  Administered 2018-02-26 – 2018-03-19 (×22): 20 mg via ORAL
  Filled 2018-02-26 (×23): qty 5

## 2018-02-26 MED ORDER — QUETIAPINE FUMARATE 25 MG PO TABS
100.0000 mg | ORAL_TABLET | Freq: Every day | ORAL | Status: DC
  Administered 2018-02-26 (×2): 100 mg via ORAL
  Filled 2018-02-26 (×2): qty 4

## 2018-02-26 NOTE — Progress Notes (Addendum)
Pharmacy Electrolyte Monitoring Consult:  Pharmacy consulted to assist in monitoring and replacing electrolytes in this 57 y.o. male admitted on 01/06/2018 with Respiratory Distress Patient receiving Magnesium oxide 400 mg PO BID.  Labs:  Sodium (mmol/L)  Date Value  02/26/2018 138   Potassium (mmol/L)  Date Value  02/26/2018 3.8   Magnesium (mg/dL)  Date Value  16/07/9603 1.8   Phosphorus (mg/dL)  Date Value  54/06/8118 2.7   Calcium (mg/dL)  Date Value  14/78/2956 8.5 (L)   Albumin (g/dL)  Date Value  21/30/8657 1.8 (L)     Assessment/Plan: In the setting of AF, will replace magnesium with magnesium sulfate 1 g iv once and f/u AM labs.   Pharmacy will continue to monitor and adjust per consult.   Gardner Candle, PharmD, BCPS Clinical Pharmacist 02/26/2018 7:32 AM

## 2018-02-26 NOTE — Progress Notes (Signed)
[facility] INF[facility] PROGRESS NOTE Date of Admission:  01/06/2018     ID: Tommy Mathews is a 58 y.o. male with fever Principal Problem:   Severe sepsis with septic shock ([place]) Active Problems:   GI bleed   Acute respiratory failure with hypoxia (HCC)   Multifocal pneumonia   UTI (urinary tract infection)   AKI (acute kidney injury) ([place])   Ileus (HCC)   Diabetes ([place])   Pressure injury of skin   Subjective: No fevers.  Remains on vent not on pressors. FiO2 up to 55%  ROS  Unable to obtain Medications:  Antibiotics Given (last 72 hours)    Date/Time Action Medication Dose Rate   02/24/18 1300 New Bag/Given   vancomycin (VANCOCIN) IVPB 1000 mg/200 mL premix 1,000 mg 200 mL/hr   02/24/18 1502 New Bag/Given   ceFEPIme (MAXIPIME) 2 g in sodium chloride 0.9 % 100 mL IVPB 2 g 200 mL/hr   02/24/18 1724 New Bag/Given   vancomycin (VANCOCIN) 1,250 mg in sodium chloride 0.9 % 250 mL IVPB 1,250 mg 166.7 mL/hr   02/24/18 2113 New Bag/Given   ceFEPIme (MAXIPIME) 2 g in sodium chloride 0.9 % 100 mL IVPB 2 g 200 mL/hr   02/25/18 0213 New Bag/Given   vancomycin (VANCOCIN) 1,250 mg in sodium chloride 0.9 % 250 mL IVPB 1,250 mg 166.7 mL/hr   02/25/18 0556 New Bag/Given   ceFEPIme (MAXIPIME) 2 g in sodium chloride 0.9 % 100 mL IVPB 2 g 200 mL/hr   02/25/18 1420 New Bag/Given   meropenem (MERREM) 1 g in sodium chloride 0.9 % 100 mL IVPB 1 g 200 mL/hr   02/25/18 2135 New Bag/Given   meropenem (MERREM) 1 g in sodium chloride 0.9 % 100 mL IVPB 1 g 200 mL/hr   02/26/18 0544 New Bag/Given   meropenem (MERREM) 1 g in sodium chloride 0.9 % 100 mL IVPB 1 g 200 mL/hr   02/26/18 1445 New Bag/Given   meropenem (MERREM) 1 g in sodium chloride 0.9 % 100 mL IVPB 1 g 200 mL/hr     . acetylcysteine  2 mL Nebulization Q8H  . budesonide (PULMICORT) nebulizer solution  0.5 mg Nebulization BID  . chlorhexidine gluconate (MEDLINE KIT)  15 mL Mouth Rinse BID  . digoxin  0.25 mg Per Tube Daily   . [START ON 02/27/2018] docusate  50 mg Per Tube BID  . [START ON 02/27/2018] enoxaparin (LOVENOX) injection  40 mg Subcutaneous Q24H  . FLUoxetine  20 mg Oral Daily  . free water  150 mL Per Tube Q8H  . gabapentin  200 mg Per Tube Q12H  . insulin aspart  0-20 Units Subcutaneous Q4H  . insulin glargine  20 Units Subcutaneous QHS  . ipratropium-albuterol  3 mL Nebulization Q4H  . lisinopril  10 mg Per Tube Daily  . magnesium oxide  400 mg Per Tube BID  . mouth rinse  15 mL Mouth Rinse 10 times per day  . metoprolol tartrate  12.5 mg Per Tube Q6H  . nutrition supplement (JUVEN)  1 packet Per Tube BID  . nystatin  5 mL Oral QID  . pantoprazole sodium  40 mg Per Tube Daily  . QUEtiapine  100 mg Oral QHS  . valproic acid  750 mg Per Tube Daily    Objective: Vital signs in last 24 hours: Temp:  [98.9 F (37.2 C)-100 F (37.8 C)] 99 F (37.2 C) (05/29 1100) Pulse Rate:  [85-101] 93 (05/29 1416) Resp:  [15-36] 15 (05/29  1416) BP: (107-153)/(55-98) 116/58 (05/29 1416) SpO2:  [85 %-100 %] 94 % (05/29 1416) FiO2 (%):  [45 %-55 %] 55 % (05/29 1500) Weight:  [104 kg (229 lb 4.5 oz)-107.1 kg (236 lb 1.8 oz)] 104 kg (229 lb 4.5 oz) (05/29 0238) Constitutional: lethargic, HENT: trach in place, anicteric Mouth/Throat: Oropharynx is clear and moist.  Cardiovascular: Normal rate, regular rhythm and normal heart sounds.  Pulmonary/Chest: rhonchi bil Abdominal: Soft. Bowel sounds are normal. He exhibits no distension. There is no tenderness.  PEG Site wnl Lymphadenopathy: He has no cervical adenopathy.  Neurological: lethargic Skin: Skin is warm and dry. No rash noted. No erythema.  Psychiatric: trached PICC RUE WNL    Lab Results Recent Labs    02/24/18 0528 02/25/18 0425 02/26/18 0415 02/26/18 1020  WBC 8.6 10.9*  --   --   HGB 9.4* 8.4*  --  8.7*  HCT 29.8* 26.1*  --  27.4*  NA 138 136 138  --   K 4.4 4.1 3.8  --   CL 92* 95* 94*  --   CO2 40* 36* 38*  --   BUN 20 24* 29*  --    CREATININE 0.71 0.64 0.66  --     Microbiology: Results for orders placed or performed during the hospital encounter of 01/06/18  Blood Culture (routine x 2)     Status: None   Collection Time: 01/06/18  7:44 PM  Result Value Ref Range Status   Specimen Description BLOOD BLOOD LEFT WRIST  Final   Special Requests   Final    BOTTLES DRAWN AEROBIC AND ANAEROBIC Blood Culture adequate volume   Culture   Final    NO GROWTH 5 DAYS Performed at [facility], [address] [place], [place] [address]    Report Status 01/11/2018 FINAL  Final  Urine culture     Status: Abnormal   Collection Time: 01/06/18  7:44 PM  Result Value Ref Range Status   Specimen Description   Final    URINE, RANDOM Performed at [facility], [address], [place], [place] [address]    Special Requests   Final    NONE Performed at [facility], [address] [place], [place] [address]    Culture (A)  Final    10,000 COLONIES/mL STAPHYLOCOCCUS SPECIES (COAGULASE NEGATIVE) CALL MICROBIOLOGY LAB IF SENSITIVITIES ARE REQUIRED. Performed at [facility], [place] [address] [place] [address] [place], [place] [address]    Report Status 01/08/2018 FINAL  Final  MRSA PCR Screening     Status: Abnormal   Collection Time: 01/06/18 11:51 PM  Result Value Ref Range Status   MRSA by PCR POSITIVE (A) NEGATIVE Final    Comment:        The GeneXpert MRSA Assay (FDA approved for NASAL specimens only), is one component of a comprehensive MRSA colonization surveillance program. It is not intended to diagnose MRSA infection nor to guide or monitor treatment for MRSA infections. RESULT CALLED TO, READ BACK BY AND VERIFIED WITH: BETH BUONO 01/07/18 @ 0121  [place]   Performed at [facility], [address] [place], [place] [address]   Blood Culture (routine x 2)     Status: None   Collection Time: 01/07/18  7:19 AM  Result Value Ref Range Status   Specimen Description BLOOD RIGHT HAND  Final    Special Requests   Final    BOTTLES DRAWN AEROBIC AND ANAEROBIC Blood Culture results may not be optimal  due to an inadequate volume of blood received in culture bottles   Culture   Final    NO GROWTH 5 DAYS Performed at [facility], [address] [place], [place] [address]    Report Status 01/12/2018 FINAL  Final  Culture, respiratory (NON-Expectorated)     Status: None   Collection Time: 01/07/18 12:28 PM  Result Value Ref Range Status   Specimen Description   Final    TRACHEAL ASPIRATE Performed at [facility], [address] [place], [place] [address]    Special Requests   Final    Normal Performed at [facility], [place], [place] [address]    Gram Stain   Final    MODERATE WBC PRESENT,BOTH PMN AND MONONUCLEAR RARE GRAM POSITIVE COCCI RARE YEAST    Culture   Final    Consistent with normal respiratory flora. Performed at [facility], [place] [address], [place], [place] [address]    Report Status 01/09/2018 FINAL  Final  Culture, expectorated sputum-assessment     Status: None   Collection Time: 01/18/18  5:07 PM  Result Value Ref Range Status   Specimen Description ENDOTRACHEAL  Final   Special Requests NONE  Final   Sputum evaluation   Final    THIS SPECIMEN IS ACCEPTABLE FOR SPUTUM CULTURE Performed at [facility], [address] [place], [place] [address]    Report Status 01/18/2018 FINAL  Final  Culture, respiratory (NON-Expectorated)     Status: None   Collection Time: 01/18/18  5:07 PM  Result Value Ref Range Status   Specimen Description   Final    ENDOTRACHEAL Performed at [facility], [address], [place], [place] [address]    Special Requests   Final    NONE Reflexed from [id number] Performed at [facility], [address] [place], [place] [address]    Gram Stain   Final    MODERATE WBC PRESENT,BOTH PMN AND MONONUCLEAR NO SQUAMOUS EPITHELIAL CELLS SEEN RARE  BUDDING YEAST SEEN    Culture   Final    RARE Consistent with normal respiratory flora. Performed at [facility], [place] [address] [place], [place] [address]    Report Status 01/21/2018 FINAL  Final  MRSA PCR Screening     Status: Abnormal   Collection Time: 01/18/18  8:26 PM  Result Value Ref Range Status   MRSA by PCR POSITIVE (A) NEGATIVE Final    Comment:        The GeneXpert MRSA Assay (FDA approved for NASAL specimens only), is one component of a comprehensive MRSA colonization surveillance program. It is not intended to diagnose MRSA infection nor to guide or monitor treatment for MRSA infections. RESULT CALLED TO, READ BACK BY AND VERIFIED WITH: ANGELA BREHUN AT 2200 ON 01/18/18 RWW Performed at [facility], [address] [place], [place] [address]   CULTURE, BLOOD (ROUTINE X 2) w Reflex to ID Panel     Status: Abnormal   Collection Time: 01/31/18  8:17 AM  Result Value Ref Range Status   Specimen Description   Final    BLOOD LEFT HAND Performed at [facility], [place] [address] [place], [place] [address]    Special Requests   Final    BOTTLES DRAWN AEROBIC AND ANAEROBIC Blood Culture results may not be optimal due to an excessive volume of blood received in culture bottles Performed at [facility], [address] [place], [place] [address]    Culture  Setup Time   Final    GRAM NEGATIVE RODS AEROBIC BOTTLE ONLY CRITICAL RESULT CALLED TO, READ BACK BY AND VERIFIED WITH: DAVID BESANTI ON 02/05/18 AT 0228 JAG Performed at [facility], [place] [address], [place], [place] [address]    Culture KLEBSIELLA PNEUMONIAE (A)  Final   Report Status 02/07/2018 FINAL  Final   Organism ID, Bacteria KLEBSIELLA PNEUMONIAE  Final      Susceptibility   Klebsiella pneumoniae - MIC*    AMPICILLIN >=32 RESISTANT Resistant     CEFAZOLIN <=4 SENSITIVE Sensitive     CEFEPIME <=1 SENSITIVE Sensitive     CEFTAZIDIME <=1 SENSITIVE Sensitive      CEFTRIAXONE <=1 SENSITIVE Sensitive     CIPROFLOXACIN <=0.25 SENSITIVE Sensitive     GENTAMICIN <=1 SENSITIVE Sensitive     IMIPENEM <=0.25 SENSITIVE Sensitive     TRIMETH/SULFA <=20 SENSITIVE Sensitive     AMPICILLIN/SULBACTAM >=32 RESISTANT Resistant     PIP/TAZO 16 SENSITIVE Sensitive     Extended ESBL NEGATIVE Sensitive     * KLEBSIELLA PNEUMONIAE  Blood Culture ID Panel (Reflexed)     Status: Abnormal   Collection Time: 01/31/18  8:17 AM  Result Value Ref Range Status   Enterococcus species NOT DETECTED NOT DETECTED Final   Listeria monocytogenes NOT DETECTED NOT DETECTED Final   Staphylococcus species NOT DETECTED NOT DETECTED Final   Staphylococcus aureus NOT DETECTED NOT DETECTED Final   Streptococcus species NOT DETECTED NOT DETECTED Final   Streptococcus agalactiae NOT DETECTED NOT DETECTED Final   Streptococcus pneumoniae NOT DETECTED NOT DETECTED Final   Streptococcus pyogenes NOT DETECTED NOT DETECTED Final   Acinetobacter baumannii NOT DETECTED NOT DETECTED Final   Enterobacteriaceae species DETECTED (A) NOT DETECTED Final    Comment: Enterobacteriaceae represent a large family of gram-negative bacteria, not a single organism. CRITICAL RESULT CALLED TO, READ BACK BY AND VERIFIED WITH: DAVID BESANTI ON 02/05/18 AT 0228 JAG    Enterobacter cloacae complex NOT DETECTED NOT DETECTED Final   Escherichia coli NOT DETECTED NOT DETECTED Final   Klebsiella oxytoca NOT DETECTED NOT DETECTED Final   Klebsiella pneumoniae DETECTED (A) NOT DETECTED Final    Comment: CRITICAL RESULT CALLED TO, READ BACK BY AND VERIFIED WITH: DAVID BESANTI ON 02/05/18 AT 0228 JAG    Proteus species NOT DETECTED NOT DETECTED Final   Serratia marcescens NOT DETECTED NOT DETECTED Final   Carbapenem resistance NOT DETECTED NOT DETECTED Final   Haemophilus influenzae NOT DETECTED NOT DETECTED Final   Neisseria meningitidis NOT DETECTED NOT DETECTED Final   Pseudomonas aeruginosa NOT DETECTED NOT  DETECTED Final   Candida albicans NOT DETECTED NOT DETECTED Final   Candida glabrata NOT DETECTED NOT DETECTED Final   Candida krusei NOT DETECTED NOT DETECTED Final   Candida parapsilosis NOT DETECTED NOT DETECTED Final   Candida tropicalis NOT DETECTED NOT DETECTED Final    Comment: Performed at [facility], [address], [place], [place] [address]  Urine Culture     Status: None   Collection Time: 01/31/18  8:30 AM  Result Value Ref Range Status   Specimen Description   Final    URINE, RANDOM Performed at [facility], [address], [place], [place] [address]    Special Requests   Final    NONE Performed at [facility], [address], [place],  [address]    Culture   Final  NO GROWTH Performed at [facility], [place] [address] [place], [place] [address]    Report Status 02/01/2018 FINAL  Final  CULTURE, BLOOD (ROUTINE X 2) w Reflex to ID Panel     Status: Abnormal   Collection Time: 01/31/18  8:53 AM  Result Value Ref Range Status   Specimen Description   Final    BLOOD BLOOD RIGHT HAND Performed at [facility], [address], [place], [place] [address]    Special Requests   Final    BOTTLES DRAWN AEROBIC AND ANAEROBIC Blood Culture adequate volume Performed at [facility], [address], [place], [place] [address]    Culture  Setup Time   Final    GRAM NEGATIVE RODS AEROBIC BOTTLE ONLY CRITICAL RESULT CALLED TO, READ BACK BY AND VERIFIED WITH: DAVID BESANTI AT 5621 ON 02/05/18 [place]. Performed at [facility], [address] [place], [place] [address]    Culture (A)  Final    KLEBSIELLA PNEUMONIAE SUSCEPTIBILITIES PERFORMED ON PREVIOUS CULTURE WITHIN THE LAST 5 DAYS. Performed at [facility], [place] [address] [place], [place] [address]    Report Status 02/07/2018 FINAL  Final  Culture, respiratory (NON-Expectorated)     Status: None   Collection Time: 01/31/18 10:47 AM  Result  Value Ref Range Status   Specimen Description   Final    TRACHEAL ASPIRATE Performed at [facility], [address], [place], [place] [address]    Special Requests   Final    NONE Performed at [facility], [address] [place], [place] [address]    Gram Stain   Final    MODERATE WBC PRESENT, PREDOMINANTLY PMN MODERATE GRAM POSITIVE COCCI MODERATE GRAM POSITIVE RODS    Culture   Final    Consistent with normal respiratory flora. Performed at [facility], [place] [address] [place], [place] [address]    Report Status 02/02/2018 FINAL  Final  CULTURE, BLOOD (ROUTINE X 2) w Reflex to ID Panel     Status: None   Collection Time: 02/04/18  5:41 PM  Result Value Ref Range Status   Specimen Description BLOOD BLOOD LEFT HAND  Final   Special Requests   Final    BOTTLES DRAWN AEROBIC AND ANAEROBIC Blood Culture adequate volume   Culture   Final    NO GROWTH 5 DAYS Performed at [facility], [address] [place], [place] [address]    Report Status 02/09/2018 FINAL  Final  CULTURE, BLOOD (ROUTINE X 2) w Reflex to ID Panel     Status: None   Collection Time: 02/04/18  8:50 PM  Result Value Ref Range Status   Specimen Description BLOOD LEFT HAND  Final   Special Requests   Final    BOTTLES DRAWN AEROBIC AND ANAEROBIC Blood Culture adequate volume   Culture   Final    NO GROWTH 5 DAYS Performed at [facility], [address] [place], [place] [address]    Report Status 02/10/2018 FINAL  Final  MRSA PCR Screening     Status: None   Collection Time: 02/05/18  9:38 AM  Result Value Ref Range Status   MRSA by PCR NEGATIVE NEGATIVE Final    Comment:        The GeneXpert MRSA Assay (FDA approved for NASAL specimens only), is one component of a comprehensive MRSA colonization surveillance program. It is not intended to diagnose MRSA infection nor to guide or  monitor treatment for MRSA infections. Performed at [facility],  [address] [place], [place] [address]   Culture, respiratory (NON-Expectorated)     Status: None   Collection Time: 02/10/18 12:02 PM  Result Value Ref Range Status   Specimen Description   Final    TRACHEAL ASPIRATE Performed at [facility], [address], [place], [place] [address]    Special Requests   Final    NONE Performed at [facility], [address] [place], [place] [address]    Gram Stain   Final    MODERATE WBC PRESENT,BOTH PMN AND MONONUCLEAR NO ORGANISMS SEEN    Culture   Final    Consistent with normal respiratory flora. Performed at [facility], [place] [address], [place], [place] [address]    Report Status 02/12/2018 FINAL  Final  Culture, respiratory (NON-Expectorated)     Status: None   Collection Time: 02/13/18  1:42 PM  Result Value Ref Range Status   Specimen Description   Final    TRACHEAL ASPIRATE Performed at [facility], [address] [place], [place] [address]    Special Requests   Final    NONE Performed at [facility], [address] [place], [place] [address]    Gram Stain   Final    ABUNDANT WBC PRESENT,BOTH PMN AND MONONUCLEAR RARE YEAST RARE GRAM VARIABLE ROD Performed at [facility], [place] [address] [place], [place] [address]    Culture FEW KLEBSIELLA PNEUMONIAE  Final   Report Status 02/16/2018 FINAL  Final   Organism ID, Bacteria KLEBSIELLA PNEUMONIAE  Final      Susceptibility   Klebsiella pneumoniae - MIC*    AMPICILLIN >=32 RESISTANT Resistant     CEFAZOLIN <=4 SENSITIVE Sensitive     CEFEPIME <=1 SENSITIVE Sensitive     CEFTAZIDIME <=1 SENSITIVE Sensitive     CEFTRIAXONE <=1 SENSITIVE Sensitive     CIPROFLOXACIN <=0.25 SENSITIVE Sensitive     GENTAMICIN <=1 SENSITIVE Sensitive     IMIPENEM <=0.25 SENSITIVE Sensitive     TRIMETH/SULFA <=20 SENSITIVE Sensitive     AMPICILLIN/SULBACTAM 16 INTERMEDIATE Intermediate     PIP/TAZO 16 SENSITIVE Sensitive      Extended ESBL NEGATIVE Sensitive     * FEW KLEBSIELLA PNEUMONIAE  Culture, respiratory (NON-Expectorated)     Status: None   Collection Time: 02/22/18  1:25 PM  Result Value Ref Range Status   Specimen Description   Final    TRACHEAL ASPIRATE Performed at [facility], [address], [place], [place] [address]    Special Requests   Final    NONE Performed at [facility], [address] [place], [place] [address]    Gram Stain   Final    MODERATE WBC PRESENT, PREDOMINANTLY PMN RARE SQUAMOUS EPITHELIAL CELLS PRESENT MODERATE GRAM POSITIVE RODS MODERATE GRAM POSITIVE COCCI IN PAIRS    Culture   Final    Consistent with normal respiratory flora. Performed at [facility], [place] [address] [place], [place] [address]    Report Status 02/24/2018 FINAL  Final  Urine Culture     Status: None   Collection Time: 02/22/18  1:34 PM  Result Value Ref Range Status   Specimen Description   Final    URINE, RANDOM Performed at [facility], [address] [place], [place] [address]    Special Requests   Final    NONE Performed at [facility], [address] [place], [place] [address]    Culture   Final    NO GROWTH Performed at [facility], [place] [address] [place], [place] [address]    Report Status 02/23/2018 FINAL  Final  Culture, respiratory (NON-Expectorated)     Status: None   Collection Time: 02/24/18  5:10 AM  Result Value Ref Range Status   Specimen Description   Final    TRACHEAL ASPIRATE Performed at [facility], [address], [place], [place] [address]    Special Requests   Final    NONE Performed at [facility] [address] [place], [place] [address]    Gram Stain   Final    ABUNDANT WBC PRESENT, PREDOMINANTLY PMN FEW SQUAMOUS EPITHELIAL CELLS PRESENT ABUNDANT GRAM POSITIVE COCCI IN PAIRS IN CHAINS MODERATE GRAM NEGATIVE RODS RARE GRAM POSITIVE RODS    Culture   Final     Consistent with normal respiratory flora. Performed at [facility], [place] [address], [place], [place] [address]    Report Status 02/26/2018 FINAL  Final  CULTURE, BLOOD (ROUTINE X 2) w Reflex to ID Panel     Status: None (Preliminary result)   Collection Time: 02/24/18  5:24 AM  Result Value Ref Range Status   Specimen Description BLOOD L HAND  Final   Special Requests   Final    BOTTLES DRAWN AEROBIC AND ANAEROBIC Blood Culture adequate volume   Culture   Final    NO GROWTH 2 DAYS Performed at [facility], [address], [place], [place] [address]    Report Status PENDING  Incomplete  CULTURE, BLOOD (ROUTINE X 2) w Reflex to ID Panel     Status: Abnormal (Preliminary result)   Collection Time: 02/24/18  5:27 AM  Result Value Ref Range Status   Specimen Description   Final    BLOOD RIGHT HAND Performed at [facility], [place] [address], [place], [place] [address]    Special Requests   Final    BOTTLES DRAWN AEROBIC AND ANAEROBIC Blood Culture adequate volume Performed at [facility], [address] [place], [place] [address]    Culture  Setup Time   Final    ANAEROBIC BOTTLE ONLY CRITICAL RESULT CALLED TO, READ BACK BY AND VERIFIED WITH: C/ MATT MCBANE '@0317'  02/25/18 [facility] GRAM NEGATIVE RODS    Culture (A)  Final    KLEBSIELLA PNEUMONIAE SUSCEPTIBILITIES TO FOLLOW Performed at [facility], [place] [address] [place] [address], [place],  [address]    Report Status PENDING  Incomplete  Blood Culture ID Panel (Reflexed)     Status: Abnormal   Collection Time: 02/24/18  5:27 AM  Result Value Ref Range Status   Enterococcus species NOT DETECTED NOT DETECTED Final   Listeria monocytogenes NOT DETECTED NOT DETECTED Final   Staphylococcus species NOT DETECTED NOT DETECTED Final   Staphylococcus aureus NOT DETECTED NOT DETECTED Final   Streptococcus species NOT DETECTED NOT DETECTED Final   Streptococcus agalactiae NOT DETECTED NOT DETECTED Final    Streptococcus pneumoniae NOT DETECTED NOT DETECTED Final   Streptococcus pyogenes NOT DETECTED NOT DETECTED Final   Acinetobacter baumannii NOT DETECTED NOT DETECTED Final   Enterobacteriaceae species DETECTED (A) NOT DETECTED Final    Comment: Enterobacteriaceae represent a large family of gram-negative bacteria, not a single organism.   Enterobacter cloacae complex NOT DETECTED NOT DETECTED Final   Escherichia coli NOT DETECTED NOT DETECTED Final   Klebsiella  oxytoca NOT DETECTED NOT DETECTED Final   Klebsiella pneumoniae DETECTED (A) NOT DETECTED Final    Comment: CRITICAL RESULT CALLED TO, READ BACK BY AND VERIFIED WITH: MATT MCBANE AT 0317 ON 02/25/18 [facility].    Proteus species NOT DETECTED NOT DETECTED Final   Serratia marcescens NOT DETECTED NOT DETECTED Final   Carbapenem resistance NOT DETECTED NOT DETECTED Final   Haemophilus influenzae NOT DETECTED NOT DETECTED Final   Neisseria meningitidis NOT DETECTED NOT DETECTED Final   Pseudomonas aeruginosa NOT DETECTED NOT DETECTED Final   Candida albicans NOT DETECTED NOT DETECTED Final   Candida glabrata NOT DETECTED NOT DETECTED Final   Candida krusei NOT DETECTED NOT DETECTED Final   Candida parapsilosis NOT DETECTED NOT DETECTED Final   Candida tropicalis NOT DETECTED NOT DETECTED Final    Comment: Performed at [facility], [address], [place], [place] [address]    Studies/Results: Dg Chest Port 1 View  Result Date: 02/26/2018 CLINICAL DATA:  Pneumonia. EXAM: PORTABLE CHEST 1 VIEW COMPARISON:  02/25/2018 FINDINGS: Tracheostomy tube overlies the airway. A right PICC terminates over the lower SVC. The cardiomediastinal silhouette is unchanged. Diffuse airspace opacity throughout both lungs has not significantly changed. No large pleural effusion or pneumothorax is identified. No acute osseous abnormality is seen. IMPRESSION: Unchanged diffuse airspace disease which may reflect ARDS. Electronically Signed   By: Logan Bores M.D.   On: 02/26/2018 11:37   Dg Chest Port 1 View  Result Date: 02/25/2018 CLINICAL DATA:  Pneumonia. EXAM: PORTABLE CHEST 1 VIEW COMPARISON:  02/24/2018. FINDINGS: Tracheostomy tube in stable position. PICC line tip at cavoatrial junction. Stable cardiomegaly. Diffuse bilateral pulmonary infiltrates/edema again noted. No interim change. No pleural effusion or pneumothorax. IMPRESSION: 1. Tracheostomy tube in stable position. PICC line noted with tip at cavoatrial junction. 2. Stable cardiomegaly. Diffuse bilateral pulmonary infiltrates/edema without interim change. No pleural effusion or pneumothorax. Electronically Signed   By: Marcello Moores  Register   On: 02/25/2018 06:47    Assessment/Plan: Adem Costlow is a 57 y.o. 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 He has had a prolonged admission and is now trached and has PEG. He was treated with IV abx until 4/15 then again 4/20-21. Was then off of abx until developed fever 5/2 with wbc up some to 12. CXR however did not show any new opacities but has increasing resp requirements and increased sputum.  PICC line RUE changed. Prior sputum cx with Nml flora 4/20. Prior bcx neg. FU BCX and sputum pending.  5/6- defervesced, bcx neg, sputum cx negative. WBC down 17->12. Day 4 of vanco and mero. Imaging shows no change in severe patchy opacities bilaterally.  5/8 had fever and increased wbc. cx now + klebsiellafrom 5/3 5/10 - no fevers, cx with Kleb sensitiive to multple abx  5/13 - stop date today for abx. No fever wbc up some to 16.  5/14 - wbc down a little, no fevers, still with sputum - cx pending 5/15- no fevers, wbc remains a little elevated. Sputum cx with nml flora.  5/28 bacteremia recurred likely pulm source.  After being off abx from 5/16- 5/26.   CXR stable 5/29 - no fevers, suspectibilities  pending Recommendations Cont meropenem pending  cx results.  Will plan on a 14 day course at this point- can narrow abx based on sensitivities.  Continue to work on clearance of secretions.  Thank you very much  for the consult. Will follow with you.  Leonel Ramsay   02/26/2018, 3:34 PM

## 2018-02-26 NOTE — Progress Notes (Signed)
Name: Tommy Mathews MRN: [id number] DOB: 1960-11-14     CONSULTATION DATE: 01/06/2018  Subjective & objectives: Improved level of alertness and no major issues last night.  He has been off Precedex for 48 hours  PAST MEDICAL HISTORY :   has a past medical history of Diabetes (HCC), GERD (gastroesophageal reflux disease), HLD (hyperlipidemia), and HTN (hypertension).  has a past surgical history that includes PEG placement (N/A, 01/21/2018) and Tracheostomy tube placement (N/A, 01/23/2018). Prior to Admission medications   Medication Sig Start Date End Date Taking? Authorizing Provider  atorvastatin (LIPITOR) 80 MG tablet Take 80 mg by mouth at bedtime.   Yes [provider]  divalproex (DEPAKOTE ER) 500 MG 24 hr tablet Take 500 mg by mouth 2 (two) times daily.   Yes [provider]  estradiol (ESTRACE) 2 MG tablet Take 2 mg by mouth daily.   Yes [provider]  FLUoxetine (PROZAC) 40 MG capsule Take 40 mg by mouth daily.   Yes [provider]  folic acid (FOLVITE) 800 MCG tablet Take 800 mcg by mouth daily.   Yes [provider]  hydrochlorothiazide (HYDRODIURIL) 50 MG tablet Take 50 mg by mouth daily.   Yes [provider]  lisinopril (PRINIVIL,ZESTRIL) 5 MG tablet Take 5 mg by mouth daily.   Yes [provider]  metFORMIN (GLUCOPHAGE) 1000 MG tablet Take 1,000 mg by mouth 2 (two) times daily with a meal.   Yes [provider]  methotrexate (RHEUMATREX) 2.5 MG tablet Take 15 mg by mouth once a week.   Yes [provider]  metoprolol tartrate (LOPRESSOR) 50 MG tablet Take 50 mg by mouth 2 (two) times daily.   Yes [provider]  Omega-3 Fatty Acids (FISH OIL) 1000 MG CAPS Take 1,000 mg by mouth daily.   Yes [provider]  QUEtiapine Fumarate (SEROQUEL PO) Take 500 mg by mouth 2 (two) times daily.   Yes [provider]  ranitidine (ZANTAC) 150 MG tablet Take 150 mg by mouth daily.    Yes [provider]  rivaroxaban (XARELTO) 10 MG TABS tablet Take 10 mg by mouth daily.   Yes [provider]  senna-docusate (SENOKOT-S) 8.6-50 MG tablet Take 2 tablets by mouth at bedtime.   Yes [provider]  sodium chloride 1 g tablet Take 1 g by mouth daily.   Yes [provider]   No Known Allergies  FAMILY HISTORY:  family history is not on file. SOCIAL HISTORY:  reports that he has been smoking cigarettes.  He started smoking about 19 years ago. He has a 40.00 pack-year smoking history. He has never used smokeless tobacco. He reports that he does not drink alcohol or use drugs.  REVIEW OF SYSTEMS:   Unable to obtain due to critical illness   VITAL SIGNS: Temp:  [98.7 F (37.1 C)-100 F (37.8 C)] 99.2 F (37.3 C) (05/29 0739) Pulse Rate:  [85-101] 85 (05/29 0904) Resp:  [17-36] 30 (05/29 0904) BP: (107-153)/(55-98) 120/69 (05/29 0900) SpO2:  [85 %-100 %] 90 % (05/29 0904) FiO2 (%):  [45 %] 45 % (05/29 0900) Weight:  [104 kg (229 lb 4.5 oz)-107.1 kg (236 lb 1.8 oz)] 104 kg (229 lb 4.5 oz) (05/29 0238)  Physical Examination:  Awake, tracks and making eye contact and following simple commands On vent, trach in place. PC, no distress. BEAE and no rales. Moderate respiratory secretions. S1 & S2 audible and no murmur PEG in place, benign abdomen with feeble  peristalses wasted extremities and no edema  ASSESSMENT / PLAN:  Acute on chronic respiratory failure s/p tracheostomy. ARDS (improved) with P/F135 on 45%PEEP of 5, on PC. -Monitor P/F and continue with vent support.  Altered mental status (improved). 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 CT head 5/25 no acute intracranial abnormality.On Valproic acid + Seroquel + Xanax + Gabapentin + Fentanyl l patch and iv PRN. -OptimizeValproic acid, hold Xanax , Seroquel and Fentanyl.  Atelectasis and pneumonia. Worsening Bil. Airspace disease pulmonary congestion. Treat for HCAP with  Merop and follow with tracheal aspirate culture. -Monitor CXR + CBC + FIO2 and cultures. -Diuresis to improve lung compliance.  Sepsis with Enterobacteriaceae, Kleb Pneum bacteremia.BLD Cult 5/27 remains -ve, Urine Cult 5/27 -ve and Sput 5/17 -ve -Merop for 14 days as advised by ID.s/p  Vanc and Cefep -Follow with ID consult   PAF. ECHO 12/2017 LV EF 20-25% -Currently in NSR.  GI bleeding. -ve endoscopy -PPI  DM -Glycemic control.  Sacral decub St/ III -Wound care  Anemia. HB dropped 1 gm within the last 24 h after starting on Lovenox. -Monitor and keep HB > 7 gm/dl  DNR  DVT&GI prophylaxis.Continue with supportive care  Critical care time 35 min

## 2018-02-26 NOTE — Progress Notes (Signed)
[facility] [place] [facility]   PATIENT NAME: Tommy Mathews    MR#:  [id number]  DATE OF BIRTH:  24-Jul-1961  SUBJECTIVE:   Patient has been off of Precedex to 48 hours.  There is a chance he may be transferred today to facility  REVIEW OF SYSTEMS:  Unable to obtain Tolerating Diet: yes       DRUG ALLERGIES:  No Known Allergies  VITALS:  Blood pressure (!) 114/58, pulse 94, temperature 99.2 F (37.3 C), temperature source Oral, resp. rate (!) 26, height  (1.778 m), weight 104 kg (229 lb 4.5 oz), SpO2 95 %.  PHYSICAL EXAMINATION:  Constitutional: Critically ill-appearing. No distress. HENT: Normocephalic. Marland Kitchen Oropharynx is clear and moist.  Tracheostomy site is intact Eyes: Conjunctivae and EOM are normal. , no scleral icterus.  Neck: Normal ROM. Neck supple. No JVD. No tracheal deviation. CVS: RRR, S1/S2 +, no murmurs, no gallops, no carotid bruit.  Pulmonary: Bilateral rhonchi no wheezing  abdominal: Soft. BS +,  no distension, tenderness, rebound or guarding.  Musculoskeletal:1+ LEE/hands with mild edema  Neuro: Moves extremities  skin: Stage II decubitus ulcer buttock psychiatric: Seems to be at baseline   LABORATORY PANEL:   CBC Recent Labs  Lab 02/25/18 0425  WBC 10.9*  HGB 8.4*  HCT 26.1*  PLT 151   ------------------------------------------------------------------------------------------------------------------  Chemistries  Recent Labs  Lab 02/25/18 0425 02/26/18 0415  NA 136 138  K 4.1 3.8  CL 95* 94*  CO2 36* 38*  GLUCOSE 155* 139*  BUN 24* 29*  CREATININE 0.64 0.66  CALCIUM 8.1* 8.5*  MG 1.7 1.8  AST 50*  --   ALT 33  --   ALKPHOS 59  --   BILITOT 0.9  --    ------------------------------------------------------------------------------------------------------------------  Cardiac Enzymes No results for input(s): TROPONINI in the last 168  hours. ------------------------------------------------------------------------------------------------------------------  RADIOLOGY:  Dg Abd 1 View  Result Date: 02/24/2018 CLINICAL DATA:  Hypoactive bowel sounds, clogged feeding tube EXAM: ABDOMEN - 1 VIEW COMPARISON:  01/27/2018 FINDINGS: Gastrostomy device projects in the left upper abdomen. Mild gaseous distention of the stomach. Small bowel is nondilated. Moderate fecal material in the proximal colon, rectum decompressed. Regional bones unremarkable. No abnormal abdominal calcifications. IMPRESSION: 1. Nonobstructive bowel gas pattern with moderate proximal colonic fecal material. 2. Gastrostomy catheter projects in expected location. Electronically Signed   By: Corlis Leak M.D.   On: 02/24/2018 11:28   Dg Chest Port 1 View  Result Date: 02/25/2018 CLINICAL DATA:  Pneumonia. EXAM: PORTABLE CHEST 1 VIEW COMPARISON:  02/24/2018. FINDINGS: Tracheostomy tube in stable position. PICC line tip at cavoatrial junction. Stable cardiomegaly. Diffuse bilateral pulmonary infiltrates/edema again noted. No interim change. No pleural effusion or pneumothorax. IMPRESSION: 1. Tracheostomy tube in stable position. PICC line noted with tip at cavoatrial junction. 2. Stable cardiomegaly. Diffuse bilateral pulmonary infiltrates/edema without interim change. No pleural effusion or pneumothorax. Electronically Signed   By: Maisie Fus  Register   On: 02/25/2018 06:47     ASSESSMENT AND PLAN:   57 year old male with history of TBI admitted for severe respiratory failure and severe shock with pneumonia.  1.  Severe hypoxic and hypercapnic respiratory failure/ARDS: Patient remains on vent support via tracheostomy Management as per intensivist  2.  Ventilator associated pneumonia with sepsis and Klebsiella: Patient had completed antibiotics Had another fever on May 27 and therefore on meropenem as per recommendations by intensivist.  3.  PAF: Patient is currently in  sinus rhythm Continue digoxin and  metoprolol Amiodarone discontinued due to worsening of respiratory status  4.  Acute on chronic systolic heart failure: Currently euvolemic  continue Lasix as needed, lisinopril and metoprolol  5.  TBI/depression: Patient evaluated by psychiatry Continue valproic acid and Seroquel  6.  Recent GI bleed on PPI with negative endoscopy Monitor hemoglobin Continue PPI 7.  Acute kidney injury in the setting of sepsis which has resolved  8.  Diabetes: Continue Lantus with sliding scale  9.  Stage II decubitus buttock ulcer: Continue local wound care  10.  Nutrition: On tube feeds     Awaiting placement/Medicaid   CODE STATUS: DNR  TOTAL TIME TAKING CARE OF THIS PATIENT: 21 minutes.     Possible discharge today   Kesley Mullens M.D on 02/26/2018 at 8:23 AM  Between 7am to 6pm - Pager - [phone] After 6pm go to [email or web address] - password [email or web address]  [facility]  Office  [phone]  CC: Primary care physician; Marcina Millard, MD  Note: This dictation was prepared with Dragon dictation along with smaller phrase technology. Any transcriptional errors that result from this process are unintentional.

## 2018-02-26 NOTE — Clinical Social Work Note (Signed)
Boneta Lucks with [facility] stated that she has to get some forms from patient's former group home and that she needed more updates sent. She stated patient would either need to be off his celexa or have a diagnosis of major depression, schizophrenia, or bipolar. The physician has discontinued patient's celexa. Patient will also need to be below 45% FIO2 and CSW noticed that patient's FIO2 today was 55%. CSW contacted patient's nurse and she was going to speak with Respiratory Therapy to have them work on weaning him back down to at least 45%. CSW sent all updates requested. Boneta Lucks stated that she also will need to hear back from her administrators about the medicaid pending clearance prior to taking patient. We are hoping for a discharge this week but everything may not fall into place by then. York Spaniel MSW,LCSW [phone]

## 2018-02-26 NOTE — Progress Notes (Signed)
Pharmacy Electrolyte Monitoring Consult:  Pharmacy consulted to assist in monitoring and replacing electrolytes in this 57 y.o. male admitted on 01/06/2018 with Respiratory Distress Patient receiving Magnesium oxide 400 mg PO BID.  Labs:  Sodium (mmol/L)  Date Value  02/26/2018 138   Potassium (mmol/L)  Date Value  02/26/2018 3.8   Magnesium (mg/dL)  Date Value  91/47/8295 1.8   Phosphorus (mg/dL)  Date Value  62/13/0865 2.7   Calcium (mg/dL)  Date Value  78/46/9629 8.5 (L)   Albumin (g/dL)  Date Value  52/84/1324 1.8 (L)     Assessment/Plan: In the setting of AF, will replace magnesium with magnesium sulfate 1 g iv once and KCL x 1 and f/u AM labs.   Pharmacy will continue to monitor and adjust per consult.   Gardner Candle, PharmD, BCPS Clinical Pharmacist 02/26/2018 7:33 AM

## 2018-02-26 NOTE — Progress Notes (Signed)
   02/26/18 1100  Adult Ventilator Settings  Vent Type Servo i  Humidity HME  Vent Mode PCV  Set Rate 18 bmp  FiO2 (%) 55 %  I Time 0.7 Sec(s)  PEEP 7 cmH20

## 2018-02-26 NOTE — Progress Notes (Signed)
Pharmacy Antibiotic Note  Tommy Mathews is a 57 y.o. male being treated for possible HCAP/ pneumonia. Patient has had extended/complicated stay at hospital. Pharmacy has been consulted for meropenem. Patient has recurrent Klebsiella bacteremia.   Plan: Continue Meropenem 1 g iv q 8 hours pending Cx results. Per ID, planning for 14 course of Abx.   MD is aware of drug interaction with VPA. Patient does not have a h/o seizure disorder. VPA likely being used as a mood stabilizer or for neuropathy.    Height:  (177.8 cm) Weight: 229 lb 4.5 oz (104 kg) IBW/kg (Calculated) : 73  Adjusted Body Weight: 98kg   Temp (24hrs), Avg:99.1 F (37.3 C), Min:98.6 F (37 C), Max:100 F (37.8 C)  Recent Labs  Lab 02/20/18 0427  02/22/18 0622 02/22/18 1334 02/23/18 0611 02/24/18 0528 02/24/18 1226 02/24/18 1958 02/25/18 0425 02/26/18 0415  WBC 9.7  --   --  7.8  --  8.6  --   --  10.9*  --   CREATININE  --    < > 0.73  --  0.64 0.71  --   --  0.64 0.66  LATICACIDVEN  --   --   --   --   --   --  0.7 1.0  --   --    < > = values in this interval not displayed.    Estimated Creatinine Clearance: 126 mL/min (by C-G formula based on SCr of 0.66 mg/dL).    No Known Allergies  Antimicrobials this admission: Vancomycin 5/3 >> 5/10, 5/27 >> 5/28 Meropenem 5/3 >> 5/10, meropenem 5/28 >> Anidulafungin 5/3 >> 5/6 Cefazolin 5/11 >> 5/13   Cefepime 5/27 >> 5/28  Microbiology results: 5/3 BCx: Klebsiella Pneumoniae 5/7 BCx: no growth  5/3, 5/25 UCx: NG  5/3 Tracheal Aspirate: mod GPC, GPR  4/20, 5/8 MRSA PCR: positive 5/16 Trach aspirate: Kleb pneumo 5/25 Trach aspirate: normal resp flora  5/27 Trach aspirate: GPC in pairs/chains, GNR 5/27 BCx: GNR, Klebsiella pneumoniae  Thank you for allowing pharmacy to be a part of this patient's care.  Gardner Candle, PharmD, BCPS Clinical Pharmacist 02/26/2018 7:36 AM

## 2018-02-26 NOTE — Progress Notes (Signed)
Alert with a few periods of restlessness and agitation. Prozac was started. Abg this am with pO2 44 and repeat pO2 48. Fio2 was increased to 55% for several hours. Currently at 50% fiO2 on vent. Secretions have been thick white. LTAC facility reportedly wants fio2 less than 45% at transfer. No fevers. On 14 day course of merrem. Seen by ID service Dr Sampson Goon . Additional H&H drawn today. Hgb stable at 8.7 and 8.4. On protonix. Had large brown soft bm today with dulcolax supp.. Tolerating TF.

## 2018-02-27 DIAGNOSIS — F203 Undifferentiated schizophrenia: Secondary | ICD-10-CM

## 2018-02-27 DIAGNOSIS — F209 Schizophrenia, unspecified: Secondary | ICD-10-CM

## 2018-02-27 LAB — BASIC METABOLIC PANEL
ANION GAP: 7 (ref 5–15)
BUN: 26 mg/dL — ABNORMAL HIGH (ref 6–20)
CO2: 35 mmol/L — ABNORMAL HIGH (ref 22–32)
Calcium: 8.4 mg/dL — ABNORMAL LOW (ref 8.9–10.3)
Chloride: 96 mmol/L — ABNORMAL LOW (ref 101–111)
Creatinine, Ser: 0.56 mg/dL — ABNORMAL LOW (ref 0.61–1.24)
GLUCOSE: 144 mg/dL — AB (ref 65–99)
POTASSIUM: 3.7 mmol/L (ref 3.5–5.1)
Sodium: 138 mmol/L (ref 135–145)

## 2018-02-27 LAB — CULTURE, BLOOD (ROUTINE X 2): SPECIAL REQUESTS: ADEQUATE

## 2018-02-27 LAB — HEMOGLOBIN AND HEMATOCRIT, BLOOD
HCT: 25.6 % — ABNORMAL LOW (ref 40.0–52.0)
HEMATOCRIT: 26.1 % — AB (ref 40.0–52.0)
Hemoglobin: 8.1 g/dL — ABNORMAL LOW (ref 13.0–18.0)
Hemoglobin: 8.4 g/dL — ABNORMAL LOW (ref 13.0–18.0)

## 2018-02-27 LAB — GLUCOSE, CAPILLARY
GLUCOSE-CAPILLARY: 127 mg/dL — AB (ref 65–99)
GLUCOSE-CAPILLARY: 126 mg/dL — AB (ref 65–99)
GLUCOSE-CAPILLARY: 148 mg/dL — AB (ref 65–99)
GLUCOSE-CAPILLARY: 130 mg/dL — AB (ref 65–99)
Glucose-Capillary: 130 mg/dL — ABNORMAL HIGH (ref 65–99)
Glucose-Capillary: 157 mg/dL — ABNORMAL HIGH (ref 65–99)

## 2018-02-27 LAB — MAGNESIUM: MAGNESIUM: 1.8 mg/dL (ref 1.7–2.4)

## 2018-02-27 MED ORDER — VALPROIC ACID 250 MG/5ML PO SOLN
500.0000 mg | Freq: Two times a day (BID) | ORAL | Status: DC
  Administered 2018-02-27 – 2018-03-19 (×40): 500 mg
  Filled 2018-02-27 (×42): qty 10

## 2018-02-27 MED ORDER — QUETIAPINE FUMARATE 300 MG PO TABS
500.0000 mg | ORAL_TABLET | Freq: Every day | ORAL | Status: DC
  Administered 2018-02-27 – 2018-03-08 (×10): 500 mg via ORAL
  Filled 2018-02-27 (×9): qty 1

## 2018-02-27 MED ORDER — MAGNESIUM SULFATE 2 GM/50ML IV SOLN
2.0000 g | Freq: Once | INTRAVENOUS | Status: AC
  Administered 2018-02-27: 2 g via INTRAVENOUS
  Filled 2018-02-27: qty 50

## 2018-02-27 MED ORDER — CLONAZEPAM 0.5 MG PO TABS
0.2500 mg | ORAL_TABLET | Freq: Two times a day (BID) | ORAL | Status: DC | PRN
  Administered 2018-03-01 – 2018-03-03 (×3): 0.25 mg via ORAL
  Filled 2018-02-27 (×6): qty 1

## 2018-02-27 MED ORDER — DEXTROSE 5 % IV SOLN
3.0000 g | Freq: Three times a day (TID) | INTRAVENOUS | Status: DC
  Administered 2018-02-27 – 2018-02-28 (×3): 3 g via INTRAVENOUS
  Filled 2018-02-27: qty 3000
  Filled 2018-02-27 (×3): qty 3
  Filled 2018-02-27: qty 3000

## 2018-02-27 MED ORDER — ALTEPLASE 2 MG IJ SOLR: 2.0000 mg | Freq: Once | INTRAMUSCULAR | Status: DC

## 2018-02-27 MED ORDER — CLONAZEPAM 0.5 MG PO TABS
0.5000 mg | ORAL_TABLET | Freq: Two times a day (BID) | ORAL | Status: DC
  Administered 2018-02-27 – 2018-03-08 (×19): 0.5 mg via ORAL
  Filled 2018-02-27 (×19): qty 1

## 2018-02-27 MED ORDER — ACETYLCYSTEINE 20 % IN SOLN
2.0000 mL | Freq: Three times a day (TID) | RESPIRATORY_TRACT | Status: DC
  Administered 2018-02-28 – 2018-03-02 (×7): 2 mL via RESPIRATORY_TRACT
  Administered 2018-03-02: 4 mL via RESPIRATORY_TRACT
  Administered 2018-03-02: 2 mL via RESPIRATORY_TRACT
  Administered 2018-03-03: 4 mL via RESPIRATORY_TRACT
  Administered 2018-03-03 – 2018-03-04 (×3): 2 mL via RESPIRATORY_TRACT
  Administered 2018-03-04: 4 mL via RESPIRATORY_TRACT
  Administered 2018-03-04 – 2018-03-05 (×2): 2 mL via RESPIRATORY_TRACT
  Filled 2018-02-27 (×17): qty 4

## 2018-02-27 MED ORDER — ACETYLCYSTEINE 20 % IN SOLN: 2.0000 mL | Freq: Three times a day (TID) | RESPIRATORY_TRACT | Status: DC

## 2018-02-27 MED ORDER — FUROSEMIDE 10 MG/ML IJ SOLN
20.0000 mg | Freq: Four times a day (QID) | INTRAMUSCULAR | Status: AC
  Administered 2018-02-27 – 2018-02-28 (×3): 20 mg via INTRAVENOUS
  Filled 2018-02-27 (×3): qty 2

## 2018-02-27 MED ORDER — ACETYLCYSTEINE 20 % IN SOLN
2.0000 mL | Freq: Three times a day (TID) | RESPIRATORY_TRACT | Status: DC
  Administered 2018-02-27: 2 mL via RESPIRATORY_TRACT
  Filled 2018-02-27: qty 4

## 2018-02-27 MED ORDER — LORAZEPAM 2 MG/ML IJ SOLN
2.0000 mg | Freq: Once | INTRAMUSCULAR | Status: AC
  Administered 2018-02-27: 2 mg via INTRAVENOUS
  Filled 2018-02-27: qty 1

## 2018-02-27 NOTE — Progress Notes (Signed)
   02/26/18 2017  Vent Select  Invasive or Noninvasive Invasive  Adult Vent Y  Tracheostomy Shiley 8 mm Cuffed  Placement Date/Time: 01/23/18 1050   Placed By: (c) Other (Comment)  Brand: Shiley  Size (mm): 8 mm  Style: Cuffed  Serial / Lot #: [id number]  Expiration Date: 05/22/22  Status Secured  Site Assessment Clean;Dry  Ties Assessment Clean;Dry;Secure  Cuff pressure (cm) 28 cm  Tracheostomy Equipment at bedside Yes and checklist posted at head of bed  Adult Ventilator Settings  Vent Type Servo i  Humidity HME  Vent Mode PCV  Set Rate 18 bmp  FiO2 (%) 50 %  I Time 0.7 Sec(s)  Pressure Control 14 cmH20  PEEP 7 cmH20  High PEEP 10 sec  Low PEEP 5 sec  Adult Ventilator Measurements  Peak Airway Pressure 25 L/min  Mean Airway Pressure 15 cmH20  Resp Rate Total 42 br/min  Exhaled Vt 558 mL  Measured Ve 17.7 mL  I:E Ratio Measured 1:1.1  End Tidal CO2 44  Adult Ventilator Alarms  Alarms On Y  Ve High Alarm 30 L/min  Ve Low Alarm 2 L/min  Resp Rate High Alarm 45 br/min  Resp Rate Low Alarm 5  PEEP Low Alarm 5 cmH2O  Press High Alarm 50 cmH2O  VAP Prevention  HME changed Yes  HOB> 30 Degrees Y  Breath Sounds  Bilateral Breath Sounds Rhonchi  Airway Suctioning/Secretions  Suction Type Tracheal  Suction Device  Catheter  Secretion Amount Moderate  Secretion Color White  Secretion Consistency Thick  Suction Tolerance Tolerated fairly well  Suctioning Adverse Effects Anxiety

## 2018-02-27 NOTE — Progress Notes (Signed)
   02/26/18 0351  Vent Select  Invasive or Noninvasive Invasive  Adult Vent Y  Tracheostomy Shiley 8 mm Cuffed  Placement Date/Time: 01/23/18 1050   Placed By: (c) Other (Comment)  Brand: Shiley  Size (mm): 8 mm  Style: Cuffed  Serial / Lot #: [id number]  Expiration Date: 05/22/22  Status Secured  Site Assessment Dry;Clean  Ties Assessment Dry;Clean  Cuff pressure (cm) 22 cm  Tracheostomy Equipment at bedside Yes and checklist posted at head of bed  Adult Ventilator Settings  Vent Type Servo i  Humidity HME  Vent Mode PRVC  Set Rate 18 bmp  FiO2 (%) 45 %  I Time 0.7 Sec(s)  I:E Ratio Set 1:3.7  Pressure Control 14 cmH20  PEEP 5 cmH20  Adult Ventilator Measurements  Peak Airway Pressure 20 L/min  Mean Airway Pressure 10 cmH20  Plateau Pressure 25 cmH20  Resp Rate Total 27 br/min  Exhaled Vt 434 mL  Spont TV 467 mL  Measured Ve 12.1 mL  I:E Ratio Measured 1:2.1  Total PEEP 5 cmH20  End Tidal CO2 50  SpO2 98 %  Adult Ventilator Alarms  Alarms On Y  Ve High Alarm 20 L/min  Ve Low Alarm 2 L/min  Resp Rate High Alarm 40 br/min  Resp Rate Low Alarm 5  PEEP Low Alarm 2 cmH2O  Press High Alarm 50 cmH2O  VAP Prevention  HME changed Yes  Transported while on vent No  HOB> 30 Degrees N  Equipment wiped down Yes  Breath Sounds  Bilateral Breath Sounds Rhonchi  Vent Respiratory Assessment  Respiratory Pattern Regular  Airway Suctioning/Secretions  Suction Type Tracheal  Suction Device  Catheter  Secretion Amount Small  Secretion Color Yellow  Secretion Consistency Thick  Suction Tolerance Tolerated well  Suctioning Adverse Effects None

## 2018-02-27 NOTE — Progress Notes (Signed)
   02/27/18 0342  Vent Select  Invasive or Noninvasive Invasive  Adult Vent Y  Tracheostomy Shiley 8 mm Cuffed  Placement Date/Time: 01/23/18 1050   Placed By: (c) Other (Comment)  Brand: Shiley  Size (mm): 8 mm  Style: Cuffed  Serial / Lot #: [id number]  Expiration Date: 05/22/22  Status Secured  Site Assessment Clean;Dry  Ties Assessment Clean;Dry;Secure  Cuff pressure (cm) 30 cm  Tracheostomy Equipment at bedside Yes and checklist posted at head of bed  Adult Ventilator Settings  Vent Type Servo i  Humidity HME  Vent Mode PCV  Set Rate 18 bmp  FiO2 (%) 45 %  I Time 0.7 Sec(s)  PEEP 7 cmH20  High PEEP 10 sec  Low PEEP 5 sec  Adult Ventilator Measurements  Peak Airway Pressure 24 L/min  Mean Airway Pressure 14 cmH20  Resp Rate Spontaneous 22 br/min  Resp Rate Total 40 br/min  Exhaled Vt 427 mL  Measured Ve 16.9 mL  I:E Ratio Measured 1:1.1  End Tidal CO2 42  Adult Ventilator Alarms  Alarms On Y  Ve High Alarm 30 L/min  Ve Low Alarm 2 L/min  Resp Rate High Alarm 45 br/min  Resp Rate Low Alarm 5  PEEP Low Alarm 5 cmH2O  Press High Alarm 50 cmH2O  VAP Prevention  HOB> 30 Degrees Y  Breath Sounds  Bilateral Breath Sounds Rhonchi  Airway Suctioning/Secretions  Suction Type Tracheal  Suction Device  Catheter  Secretion Amount Small  Secretion Color White  Secretion Consistency Thick  Suction Tolerance Tolerated well  Suctioning Adverse Effects None

## 2018-02-27 NOTE — Progress Notes (Signed)
   02/26/18 1628  Vent Select  Invasive or Noninvasive Invasive  Adult Vent Y  Tracheostomy Shiley 8 mm Cuffed  Placement Date/Time: 01/23/18 1050   Placed By: (c) Other (Comment)  Brand: Shiley  Size (mm): 8 mm  Style: Cuffed  Serial / Lot #: [id number]  Expiration Date: 05/22/22  Status Secured  Site Assessment Dry;Clean  Ties Assessment Clean;Dry  Cuff pressure (cm) 24 cm  Tracheostomy Equipment at bedside Yes and checklist posted at head of bed  Adult Ventilator Settings  Vent Type Servo i  Humidity HME  Vent Mode PCV  Set Rate 18 bmp  FiO2 (%) 50 %  I Time 0.7 Sec(s)  Pressure Control 14 cmH20  PEEP 7 cmH20  Adult Ventilator Measurements  Peak Airway Pressure 23 L/min  Mean Airway Pressure 16 cmH20  Resp Rate Total 34 br/min  Exhaled Vt 480 mL  Measured Ve 14 mL  I:E Ratio Measured 1:2  End Tidal CO2 48  SpO2 98 %  Adult Ventilator Alarms  Alarms On Y  Ve High Alarm 30 L/min  Ve Low Alarm 2 L/min  Resp Rate High Alarm 40 br/min  Resp Rate Low Alarm 5  PEEP Low Alarm 5 cmH2O  VAP Prevention  HOB> 30 Degrees Y  Breath Sounds  Bilateral Breath Sounds Rhonchi  Airway Suctioning/Secretions  Suction Type Tracheal  Suction Device  Catheter  Secretion Amount Moderate  Secretion Color White  Secretion Consistency Thick  Suction Tolerance Tolerated well  Suctioning Adverse Effects None

## 2018-02-27 NOTE — Progress Notes (Signed)
   02/26/18 2345  Vent Select  Invasive or Noninvasive Invasive  Adult Vent Y  Tracheostomy Shiley 8 mm Cuffed  Placement Date/Time: 01/23/18 1050   Placed By: (c) Other (Comment)  Brand: Shiley  Size (mm): 8 mm  Style: Cuffed  Serial / Lot #: [id number]  Expiration Date: 05/22/22  Status Secured  Site Assessment Clean;Dry  Ties Assessment Clean;Dry;Secure  Cuff pressure (cm) 30 cm  Tracheostomy Equipment at bedside Yes and checklist posted at head of bed  Adult Ventilator Settings  Vent Type Servo i  Humidity HME  Vent Mode PCV  Set Rate 18 bmp  FiO2 (%) 45 %  I Time 0.7 Sec(s)  Pressure Control 14 cmH20  PEEP 7 cmH20  High PEEP 10 sec  Low PEEP 5 sec  Adult Ventilator Measurements  Peak Airway Pressure 25 L/min  Mean Airway Pressure 14 cmH20  Resp Rate Total 37 br/min  Exhaled Vt 456 mL  Measured Ve 16 mL  I:E Ratio Measured 1:1.3  End Tidal CO2 39  Adult Ventilator Alarms  Alarms On Y  Ve High Alarm 30 L/min  Ve Low Alarm 2 L/min  Resp Rate High Alarm 45 br/min  Resp Rate Low Alarm 5  PEEP Low Alarm 5 cmH2O  Press High Alarm 50 cmH2O  VAP Prevention  HME changed Yes  HOB> 30 Degrees Y  Breath Sounds  Bilateral Breath Sounds Rhonchi  Airway Suctioning/Secretions  Suction Type Tracheal  Suction Device  Catheter  Secretion Amount Small  Secretion Color White  Secretion Consistency Thick  Suction Tolerance Tolerated well  Suctioning Adverse Effects None   

## 2018-02-27 NOTE — Progress Notes (Signed)
   02/26/18 2345  Vent Select  Invasive or Noninvasive Invasive  Adult Vent Y  Tracheostomy Shiley 8 mm Cuffed  Placement Date/Time: 01/23/18 1050   Placed By: (c) Other (Comment)  Brand: Shiley  Size (mm): 8 mm  Style: Cuffed  Serial / Lot #: [id number]  Expiration Date: 05/22/22  Status Secured  Site Assessment Clean;Dry  Ties Assessment Clean;Dry;Secure  Cuff pressure (cm) 30 cm  Tracheostomy Equipment at bedside Yes and checklist posted at head of bed  Adult Ventilator Settings  Vent Type Servo i  Humidity HME  Vent Mode PCV  Set Rate 18 bmp  FiO2 (%) 45 %  I Time 0.7 Sec(s)  Pressure Control 14 cmH20  PEEP 7 cmH20  High PEEP 10 sec  Low PEEP 5 sec  Adult Ventilator Measurements  Peak Airway Pressure 25 L/min  Mean Airway Pressure 14 cmH20  Resp Rate Total 37 br/min  Exhaled Vt 456 mL  Measured Ve 16 mL  I:E Ratio Measured 1:1.3  End Tidal CO2 39  Adult Ventilator Alarms  Alarms On Y  Ve High Alarm 30 L/min  Ve Low Alarm 2 L/min  Resp Rate High Alarm 45 br/min  Resp Rate Low Alarm 5  PEEP Low Alarm 5 cmH2O  Press High Alarm 50 cmH2O  VAP Prevention  HME changed Yes  HOB> 30 Degrees Y  Breath Sounds  Bilateral Breath Sounds Rhonchi  Airway Suctioning/Secretions  Suction Type Tracheal  Suction Device  Catheter  Secretion Amount Small  Secretion Color White  Secretion Consistency Thick  Suction Tolerance Tolerated well  Suctioning Adverse Effects None

## 2018-02-27 NOTE — Progress Notes (Signed)
Pharmacy Antibiotic Note  Tommy Mathews is a 57 y.o. male being treated for possible HCAP/ pneumonia. Patient has had extended/complicated stay at hospital. Pharmacy has been consulted for cefazolin. Patient has recurrent Klebsiella bacteremia.   Plan: Per BCx sensitivities, will narrow meropenem to cefazolin 3 g IV Q8h.  Per ID, planning for 14 course of Abx.    Height:  (177.8 cm) Weight: 230 lb 2.6 oz (104.4 kg) IBW/kg (Calculated) : 73  Adjusted Body Weight: 98kg   Temp (24hrs), Avg:99 F (37.2 C), Min:98.3 F (36.8 C), Max:99.8 F (37.7 C)  Recent Labs  Lab 02/22/18 1334 02/23/18 0611 02/24/18 0528 02/24/18 1226 02/24/18 1958 02/25/18 0425 02/26/18 0415 02/27/18 0418  WBC 7.8  --  8.6  --   --  10.9*  --   --   CREATININE  --  0.64 0.71  --   --  0.64 0.66 0.56*  LATICACIDVEN  --   --   --  0.7 1.0  --   --   --     Estimated Creatinine Clearance: 126.3 mL/min (A) (by C-G formula based on SCr of 0.56 mg/dL (L)).    No Known Allergies  Antimicrobials this admission: Vancomycin 5/3 >> 5/10, 5/27 >> 5/28 Meropenem 5/3 >> 5/10, meropenem 5/28 >> 5/30 Anidulafungin 5/3 >> 5/6 Cefazolin 5/11 >> 5/13, 5/30 >>  Cefepime 5/27 >> 5/28  Microbiology results: 5/3 BCx: Klebsiella Pneumoniae 5/7 BCx: no growth  5/3, 5/25 UCx: NG  5/3 Tracheal Aspirate: mod GPC, GPR  4/20, 5/8 MRSA PCR: positive 5/16 Trach aspirate: Kleb pneumo 5/25 Trach aspirate: normal resp flora  5/27 Trach aspirate: GPC in pairs/chains, GNR 5/27 BCx: Klebsiella pneumoniae, sensitive to cefazolin  Thank you for allowing pharmacy to be a part of this patient's care.  Mirna Mires, Pharmacy Student 02/27/2018 11:37 AM

## 2018-02-27 NOTE — Progress Notes (Signed)
   02/26/18 1100  Vent Select  Invasive or Noninvasive Invasive  Adult Vent Y  Tracheostomy Shiley 8 mm Cuffed  Placement Date/Time: 01/23/18 1050   Placed By: (c) Other (Comment)  Brand: Shiley  Size (mm): 8 mm  Style: Cuffed  Serial / Lot #: [id number]  Expiration Date: 05/22/22  Status Secured  Site Assessment Dry;Clean  Ties Assessment Clean;Dry  Cuff pressure (cm) 24 cm  Tracheostomy Equipment at bedside Yes and checklist posted at head of bed  Adult Ventilator Settings  Vent Type Servo i  Humidity HME  Vent Mode PCV  Set Rate 18 bmp  FiO2 (%) 55 %  I Time 0.7 Sec(s)  PEEP 7 cmH20  Adult Ventilator Measurements  Peak Airway Pressure 26 L/min  Mean Airway Pressure 12 cmH20  Resp Rate Total 34 br/min  Exhaled Vt 470 mL  Measured Ve 15.6 mL  I:E Ratio Measured 1:1.7  End Tidal CO2 48  SpO2 95 %  Adult Ventilator Alarms  Alarms On Y  Ve High Alarm 30 L/min  Ve Low Alarm 2 L/min  Resp Rate High Alarm 40 br/min  Resp Rate Low Alarm 5  PEEP Low Alarm 5 cmH2O  Press High Alarm 50 cmH2O  VAP Prevention  HOB> 30 Degrees Y  Breath Sounds  Bilateral Breath Sounds Rhonchi  Vent Respiratory Assessment  Level of Consciousness Alert  Respiratory Pattern Regular  [place] Agitation Sedation Scale  [place] Agitation Sedation Scale (RASS) 1  RASS Goal 0

## 2018-02-27 NOTE — Progress Notes (Signed)
Pharmacy Electrolyte Monitoring Consult:  Pharmacy consulted to assist in monitoring and replacing electrolytes in this 57 y.o. male admitted on 01/06/2018 with Respiratory Distress Patient receiving Magnesium oxide 400 mg PO BID.  Labs:  Sodium (mmol/L)  Date Value  02/27/2018 138   Potassium (mmol/L)  Date Value  02/27/2018 3.7   Magnesium (mg/dL)  Date Value  47/42/5956 1.8   Phosphorus (mg/dL)  Date Value  38/75/6433 2.7   Calcium (mg/dL)  Date Value  29/51/8841 8.4 (L)   Albumin (g/dL)  Date Value  66/03/3015 1.8 (L)   Corrected Ca 10.2  Assessment/Plan: In the setting of AF, replaced magnesium with magnesium sulfate 2 g IV x1.  Will continue to monitor and f/u AM labs.   Pharmacy will continue to monitor and adjust per consult.   Mirna Mires, Pharmacy Student 02/27/2018 11:29 AM

## 2018-02-27 NOTE — Progress Notes (Signed)
Name: Tommy Mathews MRN: [id number] DOB: 1961/09/08     CONSULTATION DATE: 01/06/2018  Subjective & objective: Patient continues to be agitated.  PAST MEDICAL HISTORY :   has a past medical history of Diabetes (HCC), GERD (gastroesophageal reflux disease), HLD (hyperlipidemia), and HTN (hypertension).  has a past surgical history that includes PEG placement (N/A, 01/21/2018) and Tracheostomy tube placement (N/A, 01/23/2018). Prior to Admission medications   Medication Sig Start Date End Date Taking? Authorizing Provider  atorvastatin (LIPITOR) 80 MG tablet Take 80 mg by mouth at bedtime.   Yes [provider]  divalproex (DEPAKOTE ER) 500 MG 24 hr tablet Take 500 mg by mouth 2 (two) times daily.   Yes [provider]  estradiol (ESTRACE) 2 MG tablet Take 2 mg by mouth daily.   Yes [provider]  FLUoxetine (PROZAC) 40 MG capsule Take 40 mg by mouth daily.   Yes [provider]  folic acid (FOLVITE) 800 MCG tablet Take 800 mcg by mouth daily.   Yes [provider]  hydrochlorothiazide (HYDRODIURIL) 50 MG tablet Take 50 mg by mouth daily.   Yes [provider]  lisinopril (PRINIVIL,ZESTRIL) 5 MG tablet Take 5 mg by mouth daily.   Yes [provider]  metFORMIN (GLUCOPHAGE) 1000 MG tablet Take 1,000 mg by mouth 2 (two) times daily with a meal.   Yes [provider]  methotrexate (RHEUMATREX) 2.5 MG tablet Take 15 mg by mouth once a week.   Yes [provider]  metoprolol tartrate (LOPRESSOR) 50 MG tablet Take 50 mg by mouth 2 (two) times daily.   Yes [provider]  Omega-3 Fatty Acids (FISH OIL) 1000 MG CAPS Take 1,000 mg by mouth daily.   Yes [provider]  QUEtiapine Fumarate (SEROQUEL PO) Take 500 mg by mouth 2 (two) times daily.   Yes [provider]  ranitidine (ZANTAC) 150 MG tablet Take 150 mg by mouth daily.   Yes [provider]  rivaroxaban (XARELTO) 10 MG TABS  tablet Take 10 mg by mouth daily.   Yes [provider]  senna-docusate (SENOKOT-S) 8.6-50 MG tablet Take 2 tablets by mouth at bedtime.   Yes [provider]  sodium chloride 1 g tablet Take 1 g by mouth daily.   Yes [provider]   No Known Allergies  FAMILY HISTORY:  family history is not on file. SOCIAL HISTORY:  reports that he has been smoking cigarettes.  He started smoking about 19 years ago. He has a 40.00 pack-year smoking history. He has never used smokeless tobacco. He reports that he does not drink alcohol or use drugs.  REVIEW OF SYSTEMS:   Unable to obtain due to critical illness   VITAL SIGNS: Temp:  [98.3 F (36.8 C)-99.8 F (37.7 C)] 98.3 F (36.8 C) (05/30 0700) Pulse Rate:  [81-93] 84 (05/30 1300) Resp:  [16-43] 27 (05/30 1300) BP: (104-149)/(35-90) 115/67 (05/30 1300) SpO2:  [91 %-100 %] 96 % (05/30 1300) FiO2 (%):  [40 %-55 %] 40 % (05/30 1132) Weight:  [104.4 kg (230 lb 2.6 oz)] 104.4 kg (230 lb 2.6 oz) (05/30 0100)  Physical Examination:  Restless and has to be in mittens for attempting pulling lines, however awake andfollowingsimplecommands.  On vent, trach in place. PC, no distress. BEAE and no rales.small respiratory secretions. S1 & S2 audible and no murmur PEG in place, benign abdomen with feeble peristalses wastedextremities and no edema  ASSESSMENT / PLAN:  Acute on chronic respiratory  failure s/p tracheostomy. ARDS(improved)with P/F135on 45%PEEP of 5, on PC. -Monitor P/F and continue with vent support.  Altered mental status(improved). 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 CT head 5/25 no acute intracranial abnormality. -OptimizeValproic acid, Klonapin , Seroquel. Plan to avoid using iv meds as requested by accepting facility for LTAC.  Atelectasis and pneumonia.WorseningBil. Airspacedisease pulmonary congestion.Treat for HCAP with Merop and follow with tracheal aspirate culture. -Monitor CXR  + CBC + FIO2 and cultures. -Diuresis to improve lung compliance.  Sepsis with Enterobacteriaceae, Kleb Pneum bacteremia.BLD Cult 5/27 remains -ve, Urine Cult 5/27 -ve and Sput 5/17 -ve -Merop for 14 days as advised by ID.s/p  Vanc and Cefep -Follow with ID   PAF. ECHO 12/2017 LV EF 20-25% -Currently in NSR.  GI bleeding. -ve endoscopy -PPI  DM -Glycemic control.  Sacral decub St/ III -Wound care  Anemia. -Monitor and keep HB > 7 gm/dl  DNR  DVT&GI prophylaxis.Continue with supportive care  Critical care time 35 min

## 2018-02-27 NOTE — Progress Notes (Signed)
   02/27/18 1500  Clinical Encounter Type  Visited With Patient  Visit Type Follow-up  Spiritual Encounters  Spiritual Needs Prayer   Chaplain offered silent prayer at patient bedside.

## 2018-02-27 NOTE — Progress Notes (Signed)
   02/25/18 2349  Vent Select  Invasive or Noninvasive Invasive  Adult Vent Y  Tracheostomy Shiley 8 mm Cuffed  Placement Date/Time: 01/23/18 1050   Placed By: (c) Other (Comment)  Brand: Shiley  Size (mm): 8 mm  Style: Cuffed  Serial / Lot #: [id number]  Expiration Date: 05/22/22  Status Secured  Site Assessment Dry  Ties Assessment Clean;Secure  Cuff pressure (cm) 31 cm  Tracheostomy Equipment at bedside Yes and checklist posted at head of bed  Adult Ventilator Settings  Vent Type Servo i  Humidity HME  Vent Mode PCV  Set Rate 18 bmp  FiO2 (%) 45 %  I Time 0.7 Sec(s)  I:E Ratio Set 1:3.7  Pressure Control 14 cmH20  PEEP 5 cmH20  Adult Ventilator Measurements  Peak Airway Pressure 24 L/min  Mean Airway Pressure 12 cmH20  Resp Rate Total 37 br/min  Exhaled Vt 561 mL  Spont TV 541 mL  Measured Ve 18.3 mL  I:E Ratio Measured 1:1.4  Total PEEP 5 cmH20  End Tidal CO2 48  SpO2 94 %  Adult Ventilator Alarms  Alarms On Y  Ve High Alarm 20 L/min  Ve Low Alarm 2 L/min  Resp Rate High Alarm 40 br/min  Resp Rate Low Alarm 5  PEEP Low Alarm 2 cmH2O  Press High Alarm 50 cmH2O  VAP Prevention  HME changed Yes  Ventilator changed No  Transported while on vent No  HOB> 30 Degrees Y  Breath Sounds  Bilateral Breath Sounds Rhonchi  Vent Respiratory Assessment  Respiratory Pattern Regular  Airway Suctioning/Secretions  Suction Type Tracheal  Suction Device  Catheter  Secretion Amount Copious  Secretion Color Yellow  Secretion Consistency Thick  Suction Tolerance Tolerated well  Suctioning Adverse Effects None   

## 2018-02-27 NOTE — Progress Notes (Signed)
   02/25/18 2349  Vent Select  Invasive or Noninvasive Invasive  Adult Vent Y  Tracheostomy Shiley 8 mm Cuffed  Placement Date/Time: 01/23/18 1050   Placed By: (c) Other (Comment)  Brand: Shiley  Size (mm): 8 mm  Style: Cuffed  Serial / Lot #: [id number]  Expiration Date: 05/22/22  Status Secured  Site Assessment Dry  Ties Assessment Clean;Secure  Cuff pressure (cm) 31 cm  Tracheostomy Equipment at bedside Yes and checklist posted at head of bed  Adult Ventilator Settings  Vent Type Servo i  Humidity HME  Vent Mode PCV  Set Rate 18 bmp  FiO2 (%) 45 %  I Time 0.7 Sec(s)  I:E Ratio Set 1:3.7  Pressure Control 14 cmH20  PEEP 5 cmH20  Adult Ventilator Measurements  Peak Airway Pressure 24 L/min  Mean Airway Pressure 12 cmH20  Resp Rate Total 37 br/min  Exhaled Vt 561 mL  Spont TV 541 mL  Measured Ve 18.3 mL  I:E Ratio Measured 1:1.4  Total PEEP 5 cmH20  End Tidal CO2 48  SpO2 94 %  Adult Ventilator Alarms  Alarms On Y  Ve High Alarm 20 L/min  Ve Low Alarm 2 L/min  Resp Rate High Alarm 40 br/min  Resp Rate Low Alarm 5  PEEP Low Alarm 2 cmH2O  Press High Alarm 50 cmH2O  VAP Prevention  HME changed Yes  Ventilator changed No  Transported while on vent No  HOB> 30 Degrees Y  Breath Sounds  Bilateral Breath Sounds Rhonchi  Vent Respiratory Assessment  Respiratory Pattern Regular  Airway Suctioning/Secretions  Suction Type Tracheal  Suction Device  Catheter  Secretion Amount Copious  Secretion Color Yellow  Secretion Consistency Thick  Suction Tolerance Tolerated well  Suctioning Adverse Effects None

## 2018-02-27 NOTE — Progress Notes (Signed)
[employer] [facility] [name] [facility]   PATIENT NAME: Tommy Mathews    MR#:  [id number]  DATE OF BIRTH:  1960-12-29  SUBJECTIVE:   Patient alert this morning.  No acute events overnight.  REVIEW OF SYSTEMS:  Unable to obtain Tolerating Diet: yes       DRUG ALLERGIES:  No Known Allergies  VITALS:  Blood pressure (!) 141/83, pulse 93, temperature 98.3 F (36.8 C), resp. rate (!) 42, height  (1.778 m), weight 104.4 kg (230 lb 2.6 oz), SpO2 92 %.  PHYSICAL EXAMINATION:  Constitutional: Critically ill-appearing. No distress. HENT: Normocephalic. Marland Kitchen Oropharynx is clear and moist.  Tracheostomy site is intact Eyes: Conjunctivae and EOM are normal. , no scleral icterus.  Neck: Normal ROM. Neck supple. No JVD. No tracheal deviation. CVS: RRR, S1/S2 +, no murmurs, no gallops, no carotid bruit.  Pulmonary: Bilateral rhonchi no wheezing  abdominal: Soft. BS +,  no distension, tenderness, rebound or guarding.  Musculoskeletal:1+ LEE/hands with mild edema  Neuro: Moves extremities  skin: Stage II decubitus ulcer buttock psychiatric: Seems to be at baseline   LABORATORY PANEL:   CBC Recent Labs  Lab 02/25/18 0425  02/27/18 0240  WBC 10.9*  --   --   HGB 8.4*   < > 8.1*  HCT 26.1*   < > 25.6*  PLT 151  --   --    < > = values in this interval not displayed.   ------------------------------------------------------------------------------------------------------------------  Chemistries  Recent Labs  Lab 02/25/18 0425  02/27/18 0418  NA 136   < > 138  K 4.1   < > 3.7  CL 95*   < > 96*  CO2 36*   < > 35*  GLUCOSE 155*   < > 144*  BUN 24*   < > 26*  CREATININE 0.64   < > 0.56*  CALCIUM 8.1*   < > 8.4*  MG 1.7   < > 1.8  AST 50*  --   --   ALT 33  --   --   ALKPHOS 59  --   --   BILITOT 0.9  --   --    < > = values in this interval not displayed.    ------------------------------------------------------------------------------------------------------------------  Cardiac Enzymes No results for input(s): TROPONINI in the last 168 hours. ------------------------------------------------------------------------------------------------------------------  RADIOLOGY:  Dg Chest Port 1 View  Result Date: 02/26/2018 CLINICAL DATA:  Pneumonia. EXAM: PORTABLE CHEST 1 VIEW COMPARISON:  02/25/2018 FINDINGS: Tracheostomy tube overlies the airway. A right PICC terminates over the lower SVC. The cardiomediastinal silhouette is unchanged. Diffuse airspace opacity throughout both lungs has not significantly changed. No large pleural effusion or pneumothorax is identified. No acute osseous abnormality is seen. IMPRESSION: Unchanged diffuse airspace disease which may reflect ARDS. Electronically Signed   By: Sebastian Ache M.D.   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  1.  Severe hypoxic and hypercapnic respiratory failure/ARDS: Patient remains on vent support via tracheostomy Management as per intensivist  2.  Ventilator associated pneumonia with sepsis and Klebsiella: Patient had completed antibiotics however she had a fever on May 27 and cultures are positive for Klebsiella  Continue meropenem as per ID recommendations.  Follow-up on sensitivities.    3.  PAF: Patient is currently in sinus rhythm Continue digoxin and metoprolol Amiodarone discontinued due to worsening of respiratory  status  4.  Acute on chronic systolic heart failure: Currently euvolemic  continue Lasix as needed, lisinopril and metoprolol  5.  [name] evaluated by psychiatry Continue valproic acid  He needs to be off of Seroquel in order to go facility.  6.  Recent GI bleed on PPI with negative endoscopy Monitor  hemoglobin Continue PPI  7.  Acute kidney injury in the setting of sepsis which has resolved  8.  Diabetes: Continue Lantus with sliding scale  9.  Stage II decubitus buttock ulcer: Continue local wound care  10.  Nutrition: On tube feeds     Awaiting placement/Medicaid   CODE STATUS: DNR  TOTAL TIME TAKING CARE OF THIS PATIENT: 21 minutes.     Possible discharge today   Maximillion Gill M.D on 02/27/2018 at 9:09 AM  Between 7am to 6pm - Pager - [phone] After 6pm go to [email or web address] - password [email or web address]  [facility]  Office  [phone]  CC: Primary care physician; Marcina Millard, MD  Note: This dictation was prepared with Dragon dictation along with smaller phrase technology. Any transcriptional errors that result from this process are unintentional.

## 2018-02-27 NOTE — Progress Notes (Signed)
   02/26/18 0750  Vent Select  Invasive or Noninvasive Invasive  Adult Vent Y  Tracheostomy Shiley 8 mm Cuffed  Placement Date/Time: 01/23/18 1050   Placed By: (c) Other (Comment)  Brand: Shiley  Size (mm): 8 mm  Style: Cuffed  Serial / Lot #: [id number]  Expiration Date: 05/22/22  Status Secured  Site Assessment Dry;Clean  Ties Assessment Dry;Clean  Cuff pressure (cm) 24 cm  Tracheostomy Equipment at bedside Yes and checklist posted at head of bed  Adult Ventilator Settings  Vent Type Servo i  Humidity HME  Vent Mode PCV  Set Rate 18 bmp  FiO2 (%) 45 %  I Time 0.7 Sec(s)  Pressure Control 14 cmH20  PEEP 5 cmH20  Adult Ventilator Measurements  Peak Airway Pressure 24 L/min  Mean Airway Pressure 12 cmH20  Resp Rate Total 36 br/min  Exhaled Vt 506 mL  Measured Ve 15 mL  I:E Ratio Measured 1:3.7  End Tidal CO2 51  SpO2 94 %  Adult Ventilator Alarms  Alarms On Y  Ve High Alarm 30 L/min  Ve Low Alarm 2 L/min  Resp Rate High Alarm 40 br/min  Resp Rate Low Alarm 5  PEEP Low Alarm 2 cmH2O  Press High Alarm 50 cmH2O  VAP Prevention  HOB> 30 Degrees Y  Daily Weaning Assessment  Daily Assessment of Readiness to Wean Wean protocol criteria not met  Reason not met Fi02 > 40%;Tracheostomy tube in place  Breath Sounds  Bilateral Breath Sounds Rhonchi  Airway Suctioning/Secretions  Suction Type Tracheal  Suction Device  Catheter  Secretion Amount Moderate  Secretion Color Tan;White  Secretion Consistency Thick  Suction Tolerance Tolerated well  Suctioning Adverse Effects None

## 2018-02-27 NOTE — Progress Notes (Signed)
   02/25/18 2002  Vent Select  Invasive or Noninvasive Invasive  Adult Vent Y  Tracheostomy Shiley 8 mm Cuffed  Placement Date/Time: 01/23/18 1050   Placed By: (c) Other (Comment)  Brand: Shiley  Size (mm): 8 mm  Style: Cuffed  Serial / Lot #: [id number]  Expiration Date: 05/22/22  Status Secured  Site Assessment Dry;Clean  Cuff pressure (cm) 32 cm  Tracheostomy Equipment at bedside Yes and checklist posted at head of bed  Adult Ventilator Settings  Vent Type Servo i  Humidity HME  Vent Mode PCV  Set Rate 18 bmp  FiO2 (%) 45 %  I Time 0.7 Sec(s)  I:E Ratio Set 1:3.7  Pressure Control 14 cmH20  PEEP 5 cmH20  Adult Ventilator Measurements  Peak Airway Pressure 22 L/min  Mean Airway Pressure 11 cmH20  Resp Rate Total 35 br/min  Exhaled Vt 536 mL  Spont TV 505 mL  Measured Ve 17.4 mL  I:E Ratio Measured 1:1.2  Total PEEP 5 cmH20  End Tidal CO2 49  SpO2 (!) 89 %  Adult Ventilator Alarms  Alarms On Y  Ve High Alarm 20 L/min  Ve Low Alarm 2 L/min  Resp Rate High Alarm 40 br/min  Resp Rate Low Alarm 5  PEEP Low Alarm 2 cmH2O  Press High Alarm 50 cmH2O  VAP Prevention  HOB> 30 Degrees Y  Breath Sounds  Bilateral Breath Sounds Rhonchi  Vent Respiratory Assessment  Level of Consciousness Alert  Respiratory Pattern Regular  Airway Suctioning/Secretions  Suction Type Tracheal  Suction Device  Catheter  Secretion Amount Moderate  Secretion Color Yellow  Secretion Consistency Thick  Suction Tolerance Tolerated well  Suctioning Adverse Effects None

## 2018-02-27 NOTE — Progress Notes (Signed)
   02/27/18 0827  Vent Select  Invasive or Noninvasive Invasive  Adult Vent Y  Tracheostomy Shiley 8 mm Cuffed  Placement Date/Time: 01/23/18 1050   Placed By: (c) Other (Comment)  Brand: Shiley  Size (mm): 8 mm  Style: Cuffed  Serial / Lot #: [id number]  Expiration Date: 05/22/22  Status Secured  Site Assessment Clean;Dry  Cuff pressure (cm) 28 cm  Adult Ventilator Settings  Vent Type Servo i  Humidity HME  Vent Mode PCV  Ve 14 mL  Set Rate 18 bmp  FiO2 (%) 45 %  PEEP 7 cmH20  High PEEP 10 sec  Low PEEP 5 sec  Adult Ventilator Measurements  Peak Airway Pressure 26 L/min  Mean Airway Pressure 16 cmH20  Resp Rate Spontaneous 18 br/min  Resp Rate Total 36 br/min  Exhaled Vt 476 mL  End Tidal CO2 51  SpO2 92 %  Adult Ventilator Alarms  Alarms On Y  Ve High Alarm 30 L/min  Ve Low Alarm 2 L/min  Resp Rate High Alarm 45 br/min  Resp Rate Low Alarm 5  Press High Alarm 50 cmH2O  Breath Sounds  Bilateral Breath Sounds Rhonchi  Airway Suctioning/Secretions  Suction Type Tracheal  Suction Device  Catheter  Secretion Amount Small  Secretion Color White  Secretion Consistency Thick  Suction Tolerance Tolerated well  Suctioning Adverse Effects None

## 2018-02-27 NOTE — Progress Notes (Signed)
   02/25/18 1133  Vent Select  Invasive or Noninvasive Invasive  Adult Vent Y  Tracheostomy Shiley 8 mm Cuffed  Placement Date/Time: 01/23/18 1050   Placed By: (c) Other (Comment)  Brand: Shiley  Size (mm): 8 mm  Style: Cuffed  Serial / Lot #: [id number]  Expiration Date: 05/22/22  Status Secured  Ties Assessment Secure  Tracheostomy Equipment at bedside Yes and checklist posted at head of bed  Adult Ventilator Settings  Vent Type Servo i  Humidity HME  Vent Mode PCV  Set Rate 18 bmp  FiO2 (%) 45 %  I Time 0.71 Sec(s)  Pressure Control 14 cmH20  PEEP 5 cmH20  High PEEP 10 sec  Low PEEP 2 sec  Adult Ventilator Measurements  Peak Airway Pressure 21 L/min  Mean Airway Pressure 9 cmH20  Resp Rate Spontaneous 7 br/min  Resp Rate Total 25 br/min  Exhaled Vt 590 mL  Measured Ve 15.5 mL  I:E Ratio Measured 1:2  End Tidal CO2 48  SpO2 98 %  Adult Ventilator Alarms  Alarms On Y  Ve High Alarm 20 L/min  Ve Low Alarm 2 L/min  Resp Rate High Alarm 40 br/min  Resp Rate Low Alarm 5  PEEP Low Alarm 2 cmH2O  Press High Alarm 50 cmH2O  VAP Prevention  HME changed Yes  HOB> 30 Degrees Y  Daily Weaning Assessment  Daily Assessment of Readiness to Wean Wean protocol criteria not met  Reason not met Tracheostomy tube in place;Fi02 > 40%  Breath Sounds  Bilateral Breath Sounds Rhonchi  Airway Suctioning/Secretions  Suction Type Tracheal  Suction Device  Catheter  Secretion Amount Copious  Secretion Color Yellow  Secretion Consistency Thick  Suction Tolerance Tolerated well  Suctioning Adverse Effects None

## 2018-02-27 NOTE — Progress Notes (Signed)
   02/25/18 1550  Vent Select  Invasive or Noninvasive Invasive  Adult Vent Y  Tracheostomy Shiley 8 mm Cuffed  Placement Date/Time: 01/23/18 1050   Placed By: (c) Other (Comment)  Brand: Shiley  Size (mm): 8 mm  Style: Cuffed  Serial / Lot #: [id number]  Expiration Date: 05/22/22  Status Secured  Ties Assessment Secure  Tracheostomy Equipment at bedside Yes and checklist posted at head of bed  Adult Ventilator Settings  Vent Type Servo i  Humidity HME  Vent Mode PCV  Set Rate 18 bmp  FiO2 (%) 45 %  I Time 0.71 Sec(s)  Pressure Control 14 cmH20  PEEP 5 cmH20  High PEEP 10 sec  Low PEEP 2 sec  Adult Ventilator Measurements  Peak Airway Pressure 21 L/min  Mean Airway Pressure 10 cmH20  Resp Rate Spontaneous 11 br/min  Resp Rate Total 29 br/min  Exhaled Vt 550 mL  Measured Ve 14.6 mL  I:E Ratio Measured 1:2  End Tidal CO2 27  Adult Ventilator Alarms  Alarms On Y  Ve High Alarm 20 L/min  Ve Low Alarm 2 L/min  Resp Rate High Alarm 40 br/min  Resp Rate Low Alarm 5  PEEP Low Alarm 2 cmH2O  Press High Alarm 50 cmH2O  VAP Prevention  HME changed Yes  HOB> 30 Degrees Y  Daily Weaning Assessment  Daily Assessment of Readiness to Wean Wean protocol criteria not met  Reason not met Fi02 > 40%;Tracheostomy tube in place  Breath Sounds  Bilateral Breath Sounds Rhonchi  Airway Suctioning/Secretions  Suction Type Tracheal  Suction Device  Catheter  Secretion Amount Copious  Secretion Color Yellow  Secretion Consistency Thick  Suction Tolerance Tolerated well

## 2018-02-27 NOTE — Consult Note (Signed)
[facility] Consult   Reason for Consult: Follow-up consult for this 57 year old man severely ill in the intensive care unit Referring Physician: Mody Patient Identification: Tommy Mathews MRN:  [id number] Principal Diagnosis: Schizophrenia [facility]) Diagnosis:   Patient Active Problem List   Diagnosis Date Noted  . Schizophrenia ([place]) [F20.9] 02/27/2018  . Pressure injury of skin [L89.90] 01/18/2018  . GI bleed [K92.2] 01/06/2018  . Acute respiratory failure with hypoxia ([place]) [J96.01] 01/06/2018  . Severe sepsis with septic shock ([place]) [A41.9, R65.21] 01/06/2018  . Multifocal pneumonia [J18.9] 01/06/2018  . UTI (urinary tract infection) [N39.0] 01/06/2018  . AKI (acute kidney injury) ([place]) [N17.9] 01/06/2018  . Ileus ([place]) [K56.7] 01/06/2018  . Diabetes ([place]) [E11.9] 01/06/2018    Total Time spent with patient: 20 minutes  Subjective:   Tommy Mathews is a 57 y.o. male patient admitted with patient not able to give history.  HPI: Chart reviewed.  Patient was seen by Dr. Mamie Nick a couple weeks ago about his psychiatric medicine.  This is a 57 year old man came into the hospital with respiratory distress and has developed further medical problems.  Now on a tracheostomy chronically impaired.  Patient is not able to give any history to me.  We have nothing in the chart and old history but we do have a list of his medicines prior to coming into the hospital and a mention that he has a past history of schizophrenia.  Patient's medications before admission of Depakote, Prozac and Seroquel are consistent with a diagnosis of schizophrenia.  Patient's doses have been titrated down since Dr. Mamie Nick saw the patient but he appears to be tolerating them well.  Past Psychiatric History: No really clear information except for his medication and a mention of previous diagnosis.  No access to any old psychiatric records.  Risk to Self: Is patient at risk for suicide?: No Risk to Others:   Prior  Inpatient Therapy:   Prior Outpatient Therapy:    Past Medical History:  Past Medical History:  Diagnosis Date  . Diabetes ([place])   . GERD (gastroesophageal reflux disease)   . HLD (hyperlipidemia)   . HTN (hypertension)     Past Surgical History:  Procedure Laterality Date  . PEG PLACEMENT N/A 01/21/2018   Procedure: PERCUTANEOUS ENDOSCOPIC GASTROSTOMY (PEG) PLACEMENT;  Surgeon: [name], MD;  Location: [facility] ENDOSCOPY;  Service: Gastroenterology;  Laterality: N/A;  . TRACHEOSTOMY TUBE PLACEMENT N/A 01/23/2018   Procedure: TRACHEOSTOMY;  Surgeon: Carloyn Manner, MD;  Location: [facility] ORS;  Service: ENT;  Laterality: N/A;   Family History: History reviewed. No pertinent family history. Family Psychiatric  History: Unknown Social History:  Social History   Substance and Sexual Activity  Alcohol Use Never  . Frequency: Never     Social History   Substance and Sexual Activity  Drug Use Never    Social History   Socioeconomic History  . Marital status: Single    Spouse name: Not on file  . Number of children: Not on file  . Years of education: Not on file  . Highest education level: Not on file  Occupational History  . Not on file  Social Needs  . Financial resource strain: Somewhat hard  . Food insecurity:    Worry: Patient refused    Inability: Patient refused  . Transportation needs:    Medical: Yes    Non-medical: Yes  Tobacco Use  . Smoking status: Current Every Day Smoker    Packs/day: 1.00    Years: 40.00  Pack years: 40.00    Types: Cigarettes    Start date: 2000  . Smokeless tobacco: Never Used  Substance and Sexual Activity  . Alcohol use: Never    Frequency: Never  . Drug use: Never  . Sexual activity: Never  Lifestyle  . Physical activity:    Days per week: 0 days    Minutes per session: 0 min  . Stress: Only a little  Relationships  . Social connections:    Talks on phone: Not on file    Gets together: Not on file    Attends religious  service: Not on file    Active member of club or organization: Not on file    Attends meetings of clubs or organizations: Not on file    Relationship status: Not on file  Other Topics Concern  . Not on file  Social History Narrative   Patient's sister states patient needs assistance with life management (bills, taking care of house,etc)the patient is wheelchair bound but can stand and walk around house short distances. Pt lived in a group home setting prior to hospital. Pt's mother is next of kin. Etta Quill rn   Additional Social History:    Allergies:  No Known Allergies  Labs:  Results for orders placed or performed during the hospital encounter of 01/06/18 (from the past 48 hour(s))  Glucose, capillary     Status: Abnormal   Collection Time: 02/25/18  9:09 PM  Result Value Ref Range   Glucose-Capillary 143 (H) 65 - 99 mg/dL  Glucose, capillary     Status: Abnormal   Collection Time: 02/26/18 12:34 AM  Result Value Ref Range   Glucose-Capillary 145 (H) 65 - 99 mg/dL  Glucose, capillary     Status: Abnormal   Collection Time: 02/26/18  3:33 AM  Result Value Ref Range   Glucose-Capillary 125 (H) 65 - 99 mg/dL  Basic metabolic panel     Status: Abnormal   Collection Time: 02/26/18  4:15 AM  Result Value Ref Range   Sodium 138 135 - 145 mmol/L   Potassium 3.8 3.5 - 5.1 mmol/L   Chloride 94 (L) 101 - 111 mmol/L   CO2 38 (H) 22 - 32 mmol/L   Glucose, Bld 139 (H) 65 - 99 mg/dL   BUN 29 (H) 6 - 20 mg/dL   Creatinine, Ser 0.66 0.61 - 1.24 mg/dL   Calcium 8.5 (L) 8.9 - 10.3 mg/dL   GFR calc non Af Amer >60 >60 mL/min   GFR calc Af Amer >60 >60 mL/min    Comment: (NOTE) The eGFR has been calculated using the CKD EPI equation. This calculation has not been validated in all clinical situations. eGFR's persistently <60 mL/min signify possible Chronic Kidney Disease.    Anion gap 6 5 - 15    Comment: Performed at [facility], [address] [place], [place]  [address]  Magnesium     Status: None   Collection Time: 02/26/18  4:15 AM  Result Value Ref Range   Magnesium 1.8 1.7 - 2.4 mg/dL    Comment: Performed at [facility], [address] [place], [place] [address]  Glucose, capillary     Status: Abnormal   Collection Time: 02/26/18  7:50 AM  Result Value Ref Range   Glucose-Capillary 108 (H) 65 - 99 mg/dL  Blood gas, arterial     Status: Abnormal   Collection Time: 02/26/18  9:35 AM  Result Value Ref Range   FIO2 0.45  Mode      Value is consistent with previously reported and called value.   LHR 18 resp/min   Peep/cpap 5.0 cm H20   Pressure control 14 cm H20   pH, Arterial 7.50 (H) 7.350 - 7.450   pCO2 arterial 60 (H) 32.0 - 48.0 mmHg   pO2, Arterial 44 (L) 83.0 - 108.0 mmHg   Bicarbonate 46.8 (H) 20.0 - 28.0 mmol/L   Acid-Base Excess 21.2 (H) 0.0 - 2.0 mmol/L   O2 Saturation 84.1 %   Patient temperature 37.0    Collection site LEFT RADIAL    Sample type ARTERIAL DRAW    Allens test (pass/fail) PASS PASS    Comment: Performed at [facility], [address] [place], [place] [address]  Blood gas, arterial     Status: Abnormal (Preliminary result)   Collection Time: 02/26/18 10:15 AM  Result Value Ref Range   FIO2 0.45    Delivery systems VENTILATOR    Mode PRESSURE CONTROL    LHR 18 resp/min   Peep/cpap 5.0 cm H20   Inspiratory PAP PENDING    Expiratory PAP PENDING    Pressure control 14 cm H20   pH, Arterial 7.47 (H) 7.350 - 7.450   pCO2 arterial 62 (H) 32.0 - 48.0 mmHg   pO2, Arterial 48 (L) 83.0 - 108.0 mmHg   Bicarbonate 45.1 (H) 20.0 - 28.0 mmol/L   Acid-Base Excess 19.0 (H) 0.0 - 2.0 mmol/L   O2 Saturation 86.1 %   Patient temperature 37.0    Collection site LEFT RADIAL    Sample type ARTERIAL DRAW     Comment: Performed at [facility], [address] [place], [place] [address]  Hemoglobin and hematocrit, blood     Status: Abnormal   Collection Time: 02/26/18 10:20 AM  Result  Value Ref Range   Hemoglobin 8.7 (L) 13.0 - 18.0 g/dL   HCT 27.4 (L) 40.0 - 52.0 %    Comment: Performed at [facility], [address] [place],  [address]  Glucose, capillary     Status: Abnormal   Collection Time: 02/26/18 11:30 AM  Result Value Ref Range   Glucose-Capillary 180 (H) 65 - 99 mg/dL  Glucose, capillary     Status: Abnormal   Collection Time: 02/26/18  4:46 PM  Result Value Ref Range   Glucose-Capillary 137 (H) 65 - 99 mg/dL  Hemoglobin and hematocrit, blood     Status: Abnormal   Collection Time: 02/26/18  5:36 PM  Result Value Ref Range   Hemoglobin 8.4 (L) 13.0 - 18.0 g/dL   HCT 26.8 (L) 40.0 - 52.0 %    Comment: Performed at [facility], [address] [place], [place] [address]  Glucose, capillary     Status: Abnormal   Collection Time: 02/26/18  7:52 PM  Result Value Ref Range   Glucose-Capillary 169 (H) 65 - 99 mg/dL  Glucose, capillary     Status: Abnormal   Collection Time: 02/26/18 11:40 PM  Result Value Ref Range   Glucose-Capillary 132 (H) 65 - 99 mg/dL  Hemoglobin and hematocrit, blood     Status: Abnormal   Collection Time: 02/27/18  2:40 AM  Result Value Ref Range   Hemoglobin 8.1 (L) 13.0 - 18.0 g/dL   HCT 25.6 (L) 40.0 - 52.0 %    Comment: Performed at [facility], [address] [place], [place] [address]  Glucose, capillary     Status: Abnormal   Collection Time:  02/27/18  3:44 AM  Result Value Ref Range   Glucose-Capillary 127 (H) 65 - 99 mg/dL  Magnesium     Status: None   Collection Time: 02/27/18  4:18 AM  Result Value Ref Range   Magnesium 1.8 1.7 - 2.4 mg/dL    Comment: Performed at [facility], [address] [place], [place] [address]  Basic metabolic panel     Status: Abnormal   Collection Time: 02/27/18  4:18 AM  Result Value Ref Range   Sodium 138 135 - 145 mmol/L   Potassium 3.7 3.5 - 5.1 mmol/L   Chloride 96 (L) 101 - 111 mmol/L   CO2 35 (H) 22 - 32 mmol/L   Glucose,  Bld 144 (H) 65 - 99 mg/dL   BUN 26 (H) 6 - 20 mg/dL   Creatinine, Ser 0.56 (L) 0.61 - 1.24 mg/dL   Calcium 8.4 (L) 8.9 - 10.3 mg/dL   GFR calc non Af Amer >60 >60 mL/min   GFR calc Af Amer >60 >60 mL/min    Comment: (NOTE) The eGFR has been calculated using the CKD EPI equation. This calculation has not been validated in all clinical situations. eGFR's persistently <60 mL/min signify possible Chronic Kidney Disease.    Anion gap 7 5 - 15    Comment: Performed at [facility], [place], [place] [address]  Glucose, capillary     Status: Abnormal   Collection Time: 02/27/18  7:49 AM  Result Value Ref Range   Glucose-Capillary 126 (H) 65 - 99 mg/dL  Glucose, capillary     Status: Abnormal   Collection Time: 02/27/18  4:34 PM  Result Value Ref Range   Glucose-Capillary 148 (H) 65 - 99 mg/dL    Current Facility-Administered Medications  Medication Dose Route Frequency Provider Last Rate Last Dose  . acetaminophen (TYLENOL) suppository 650 mg  650 mg Rectal Q4H PRN Samaan, Maged, MD      . acetaminophen (TYLENOL) tablet 650 mg  650 mg Per Tube Q4H PRN Wilhelmina Mcardle, MD   650 mg at 02/25/18 0336  . acetylcysteine (MUCOMYST) 20 % nebulizer / oral solution 2 mL  2 mL Nebulization Q8H Mody, Sital, MD      . alteplase (CATHFLO ACTIVASE) injection 2 mg  2 mg Intracatheter Once Blakeney, Dana G, NP      . alteplase (CATHFLO ACTIVASE) injection 2 mg  2 mg Intracatheter Once Blakeney, Dana G, NP      . alteplase (CATHFLO ACTIVASE) injection 2 mg  2 mg Intracatheter Once Awilda Bill, NP      . bisacodyl (DULCOLAX) suppository 10 mg  10 mg Rectal Daily PRN Wilhelmina Mcardle, MD   10 mg at 01/20/18 1048  . bisacodyl (DULCOLAX) suppository 10 mg  10 mg Rectal Daily PRN Wilhelmina Mcardle, MD   10 mg at 02/26/18 1501  . budesonide (PULMICORT) nebulizer solution 0.5 mg  0.5 mg Nebulization BID Flora Lipps, MD   0.5 mg at 02/27/18 0823  . ceFAZolin (ANCEF) 3 g in dextrose 5  % 50 mL IVPB  3 g Intravenous Q8H Bettey Costa, MD   Stopped at 02/27/18 1421  . chlorhexidine gluconate (MEDLINE KIT) (PERIDEX) 0.12 % solution 15 mL  15 mL Mouth Rinse BID Wilhelmina Mcardle, MD   15 mL at 02/27/18 0804  . clonazePAM (KLONOPIN) tablet 0.25 mg  0.25 mg Oral BID PRN Bettey Costa, MD      . clonazePAM Bobbye [place]) tablet  0.5 mg  0.5 mg Oral BID Cassandria Santee, MD   0.5 mg at 02/27/18 1114  . digoxin (LANOXIN) tablet 0.25 mg  0.25 mg Per Tube Daily Paraschos, Alexander, MD   0.25 mg at 02/27/18 0956  . docusate (COLACE) 50 MG/5ML liquid 100 mg  100 mg Per Tube BID PRN Wilhelmina Mcardle, MD      . docusate (COLACE) 50 MG/5ML liquid 50 mg  50 mg Per Tube BID Samaan, Maged, MD      . enoxaparin (LOVENOX) injection 40 mg  40 mg Subcutaneous Q24H Soyla Murphy, Maged, MD   40 mg at 02/27/18 0956  . feeding supplement (GLUCERNA 1.5 CAL) liquid 1,000 mL  1,000 mL Per Tube Continuous Wilhelmina Mcardle, MD 65 mL/hr at 02/27/18 0957 1,000 mL at 02/27/18 0957  . FLUoxetine (PROZAC) 20 MG/5ML solution 20 mg  20 mg Oral Daily Cassandria Santee, MD   20 mg at 02/27/18 0956  . free water 150 mL  150 mL Per Tube Q8H Cammie Sickle A, MD   150 mL at 02/27/18 1328  . furosemide (LASIX) injection 20 mg  20 mg Intravenous Q6H Samaan, Maged, MD   20 mg at 02/27/18 1628  . gabapentin (NEURONTIN) 250 MG/5ML solution 200 mg  200 mg Per Tube Q12H Lafayette Dragon, MD   200 mg at 02/27/18 1000  . hydrALAZINE (APRESOLINE) injection 10-40 mg  10-40 mg Intravenous Q4H PRN Lafayette Dragon, MD      . insulin aspart (novoLOG) injection 0-20 Units  0-20 Units Subcutaneous Q4H Wilhelmina Mcardle, MD   3 Units at 02/27/18 1657  . insulin glargine (LANTUS) injection 20 Units  20 Units Subcutaneous QHS Conforti, John, DO   20 Units at 02/26/18 2359  . ipratropium-albuterol (DUONEB) 0.5-2.5 (3) MG/3ML nebulizer solution 3 mL  3 mL Nebulization Q4H Flora Lipps, MD   3 mL at 02/27/18 1543  . lisinopril (PRINIVIL,ZESTRIL) tablet 10 mg  10 mg  Per Tube Daily Lafayette Dragon, MD   10 mg at 02/27/18 0956  . magnesium oxide (MAG-OX) tablet 400 mg  400 mg Per Tube BID Flora Lipps, MD   400 mg at 02/27/18 0956  . MEDLINE mouth rinse  15 mL Mouth Rinse 10 times per day Wilhelmina Mcardle, MD   15 mL at 02/27/18 1656  . metoprolol tartrate (LOPRESSOR) tablet 12.5 mg  12.5 mg Per Tube Q6H [place] Dragon, MD   12.5 mg at 02/27/18 1326  . nutrition supplement (JUVEN) (JUVEN) powder packet 1 packet  1 packet Per Tube BID Flora Lipps, MD   1 packet at 02/27/18 0001  . nystatin (MYCOSTATIN) 100000 UNIT/ML suspension 500,000 Units  5 mL Oral QID Loletha Grayer, MD   500,000 Units at 02/27/18 1700  . pantoprazole sodium (PROTONIX) 40 mg/20 mL oral suspension 40 mg  40 mg Per Tube Daily Wilhelmina Mcardle, MD   40 mg at 02/27/18 1000  . QUEtiapine (SEROQUEL) tablet 500 mg  500 mg Oral Daily Soyla Murphy, Maged, MD   500 mg at 02/27/18 1114  . sodium chloride flush (NS) 0.9 % injection 10-40 mL  10-40 mL Intracatheter PRN Wilhelmina Mcardle, MD      . valproic acid (DEPAKENE) solution 500 mg  500 mg Per Tube BID Cassandria Santee, MD        Musculoskeletal: Strength & Muscle Tone: decreased Gait & Station: unable to stand Patient leans: N/A  Psychiatric Specialty Exam: Physical Exam  Nursing note and  vitals reviewed. Constitutional: He appears well-developed and well-nourished.  HENT:  Head: Normocephalic and atraumatic.  Eyes: Pupils are equal, round, and reactive to light. Conjunctivae are normal.  Neck: Normal range of motion.  Cardiovascular: Normal heart sounds.  Respiratory: Effort normal.      GI: Soft.  Musculoskeletal: Normal range of motion.  Neurological: He is alert.  Skin: Skin is warm and dry.  Psychiatric: His affect is blunt.    Review of Systems  Unable to perform ROS: Intubated    Blood pressure 133/82, pulse 88, temperature 98.4 F (36.9 C), resp. rate 20, height 5' 10" (1.778 m), weight 104.4 kg (230 lb 2.6 oz), SpO2  100 %.Body mass index is 33.02 kg/m.  General Appearance: Negative  Eye Contact:  Negative  Speech:  Negative  Volume:  Decreased  Mood:  Negative  Affect:  Negative  Thought Process:  NA  Orientation:  Negative  Thought Content:  Negative  Suicidal Thoughts:  No  Homicidal Thoughts:  No  Memory:  Negative  Judgement:  Negative  Insight:  Negative  Psychomotor Activity:  Negative  Concentration:  Concentration: Negative  Recall:  Negative  Fund of Knowledge:  Negative  Language:  Negative  Akathisia:  Negative  Handed:  Right  AIMS (if indicated):     Assets:  Resilience  ADL's:  Impaired  Cognition:  Impaired,  Severe  Sleep:        Treatment Plan Summary: Medication management and Plan Patient continues to be on Seroquel Prozac and Depakote.  Appear to be well tolerated.  Behavior has been calm.  I have made sure to add the diagnosis of schizophrenia to his problem list.  No change to medication.  I will follow as needed.  Disposition: Patient does not meet criteria for psychiatric inpatient admission. Supportive therapy provided about ongoing stressors.  Alethia Berthold, MD 02/27/2018 5:39 PM

## 2018-02-27 NOTE — Clinical Social Work Note (Signed)
Boneta Lucks at [facility] stated that she has been unable to get in touch with patient's group home to get financial information. CSW called and spoke with the Director of the group home: Jimmye Norman and she stated that she has never been paid for patient being there and that she thinks that his check might be going to the previous facility from them: [facility]. Boneta Lucks will have to confirm with Marcy Panning how much his check was for. CSW informed Boneta Lucks as well that patient had to receive intravenous ativan today and that it is being looked at again as to whether to diagnose with major depression. Boneta Lucks stated that they can accept the following meds: prozac, zoloft, effexor, and paxil. In addition, patient has to remain 45% FIO2 consistently and patient has had readings of 55% and 60%. CSW is asking how many days does he need to be at 45% or below to be considered consistent. Patient's discharge at this time will not take place before next week. York Spaniel MSW,LCSW [phone]

## 2018-02-28 ENCOUNTER — Inpatient Hospital Stay: Payer: Medicaid Other

## 2018-02-28 LAB — BLOOD GAS, ARTERIAL
Acid-Base Excess: 19 mmol/L — ABNORMAL HIGH (ref 0.0–2.0)
Acid-Base Excess: 16 mmol/L — ABNORMAL HIGH (ref 0.0–2.0)
BICARBONATE: 45.1 mmol/L — AB (ref 20.0–28.0)
BICARBONATE: 41.2 mmol/L — AB (ref 20.0–28.0)
FIO2: 0.45
FIO2: 0.4
LHR: 18 {breaths}/min
Mechanical Rate: 18
O2 SAT: 88.1 %
O2 Saturation: 86.1 %
PATIENT TEMPERATURE: 37
PATIENT TEMPERATURE: 37
PCO2 ART: 54 mmHg — AB (ref 32.0–48.0)
PEEP/CPAP: 5 cmH2O
PEEP: 7 cmH2O
PO2 ART: 50 mmHg — AB (ref 83.0–108.0)
Pressure control: 14 cmH2O
Pressure control: 14 cmH2O
pCO2 arterial: 62 mmHg — ABNORMAL HIGH (ref 32.0–48.0)
pH, Arterial: 7.47 — ABNORMAL HIGH (ref 7.350–7.450)
pH, Arterial: 7.49 — ABNORMAL HIGH (ref 7.350–7.450)
pO2, Arterial: 48 mmHg — ABNORMAL LOW (ref 83.0–108.0)

## 2018-02-28 LAB — COMPREHENSIVE METABOLIC PANEL
ALBUMIN: 2.2 g/dL — AB (ref 3.5–5.0)
ALT: 36 U/L (ref 17–63)
ANION GAP: 8 (ref 5–15)
AST: 38 U/L (ref 15–41)
Alkaline Phosphatase: 74 U/L (ref 38–126)
BUN: 20 mg/dL (ref 6–20)
CO2: 32 mmol/L (ref 22–32)
Calcium: 8.4 mg/dL — ABNORMAL LOW (ref 8.9–10.3)
Chloride: 100 mmol/L — ABNORMAL LOW (ref 101–111)
Creatinine, Ser: 0.55 mg/dL — ABNORMAL LOW (ref 0.61–1.24)
GFR calc Af Amer: >60 mL/min (ref >60)
GFR calc non Af Amer: >60 mL/min (ref >60)
Glucose, Bld: 154 mg/dL — ABNORMAL HIGH (ref 65–99)
POTASSIUM: 3.6 mmol/L (ref 3.5–5.1)
SODIUM: 140 mmol/L (ref 135–145)
Total Bilirubin: 0.4 mg/dL (ref 0.3–1.2)
Total Protein: 6.8 g/dL (ref 6.5–8.1)

## 2018-02-28 LAB — MAGNESIUM: Magnesium: 1.7 mg/dL (ref 1.7–2.4)

## 2018-02-28 LAB — GLUCOSE, CAPILLARY
GLUCOSE-CAPILLARY: 106 mg/dL — AB (ref 65–99)
GLUCOSE-CAPILLARY: 144 mg/dL — AB (ref 65–99)
GLUCOSE-CAPILLARY: 127 mg/dL — AB (ref 65–99)
Glucose-Capillary: 146 mg/dL — ABNORMAL HIGH (ref 65–99)
Glucose-Capillary: 137 mg/dL — ABNORMAL HIGH (ref 65–99)

## 2018-02-28 LAB — PHOSPHORUS: Phosphorus: 1.8 mg/dL — ABNORMAL LOW (ref 2.5–4.6)

## 2018-02-28 MED ORDER — QUETIAPINE FUMARATE 25 MG PO TABS: 100.0000 mg | ORAL_TABLET | Freq: Every day | ORAL | Status: DC

## 2018-02-28 MED ORDER — POTASSIUM & SODIUM PHOSPHATES 280-160-250 MG PO PACK
2.0000 | PACK | Freq: Three times a day (TID) | ORAL | Status: DC
  Administered 2018-02-28 (×3): 2
  Filled 2018-02-28 (×3): qty 2

## 2018-02-28 MED ORDER — CEFAZOLIN SODIUM-DEXTROSE 2-4 GM/100ML-% IV SOLN
2.0000 g | Freq: Three times a day (TID) | INTRAVENOUS | Status: DC
  Administered 2018-02-28 – 2018-03-02 (×5): 2 g via INTRAVENOUS
  Filled 2018-02-28 (×7): qty 100

## 2018-02-28 MED ORDER — QUETIAPINE FUMARATE 25 MG PO TABS
100.0000 mg | ORAL_TABLET | ORAL | Status: AC
  Administered 2018-02-28: 100 mg via ORAL
  Filled 2018-02-28: qty 4

## 2018-02-28 MED ORDER — MAGNESIUM SULFATE 2 GM/50ML IV SOLN
2.0000 g | Freq: Once | INTRAVENOUS | Status: AC
  Administered 2018-02-28: 2 g via INTRAVENOUS
  Filled 2018-02-28: qty 50

## 2018-02-28 MED ORDER — QUETIAPINE FUMARATE 200 MG PO TABS
200.0000 mg | ORAL_TABLET | Freq: Every day | ORAL | Status: DC
  Administered 2018-03-01 – 2018-03-18 (×19): 200 mg
  Filled 2018-02-28 (×18): qty 1
  Filled 2018-02-28: qty 8
  Filled 2018-02-28 (×2): qty 1

## 2018-02-28 NOTE — Progress Notes (Signed)
Pharmacy Electrolyte Monitoring Consult:  Pharmacy consulted to assist in monitoring and replacing electrolytes in this 57 y.o. male admitted on 01/06/2018 with Respiratory Distress Patient receiving Magnesium oxide 400 mg PO BID.  Patient received furosemide  IV x 3 doses on 5/30.    Labs:  Sodium (mmol/L)  Date Value  02/28/2018 140   Potassium (mmol/L)  Date Value  02/28/2018 3.6   Magnesium (mg/dL)  Date Value  16/07/9603 1.7   Phosphorus (mg/dL)  Date Value  54/06/8118 1.8 (L)   Calcium (mg/dL)  Date Value  14/78/2956 8.4 (L)   Albumin (g/dL)  Date Value  21/30/8657 2.2 (L)    Assessment/Plan: Magnesium 2g IV x 1 and potassium phosphate 2 packets VT TID x 3 doses. Continue magnesium  VT BID.  Will continue to monitor and f/u AM labs.   Pharmacy will continue to monitor and adjust per consult.   Keiran Sias L, 02/28/2018 11:16 PM

## 2018-02-28 NOTE — Progress Notes (Signed)
[facility] [place] [facility]   PATIENT NAME: Tommy Mathews    MR#:  [id number]  DATE OF BIRTH:  14-Aug-1961  SUBJECTIVE:   No acute events overnight.   REVIEW OF SYSTEMS:  Unable to obtain Tolerating Diet: yes       DRUG ALLERGIES:  No Known Allergies  VITALS:  Blood pressure (!) 142/88, pulse 88, temperature 99 F (37.2 C), resp. rate (!) 44, height  (1.778 m), weight 106.1 kg (233 lb 14.5 oz), SpO2 95 %.  PHYSICAL EXAMINATION:  Constitutional: Critically ill-appearing. No distress. HENT: Normocephalic..  Tracheostomy site is intact Eyes: Conjunctivae are normal. , no scleral icterus.  Neck:  Neck supple. No JVD. No tracheal deviation. CVS: RRR, S1/S2 +, no murmurs, no gallops, no carotid bruit.  Pulmonary: CTAB no wheezing today abdominal: Soft. BS +,  no distension, tenderness, rebound or guarding.  Musculoskeletal:1+ LEE/hands with mild edema  Neuro: Moves extremities  skin: Stage II decubitus ulcer buttock PSYCH seems to be at baseline   LABORATORY PANEL:   CBC Recent Labs  Lab 02/25/18 0425  02/27/18 1700  WBC 10.9*  --   --   HGB 8.4*   < > 8.4*  HCT 26.1*   < > 26.1*  PLT 151  --   --    < > = values in this interval not displayed.   ------------------------------------------------------------------------------------------------------------------  Chemistries  Recent Labs  Lab 02/28/18 0630  NA 140  K 3.6  CL 100*  CO2 32  GLUCOSE 154*  BUN 20  CREATININE 0.55*  CALCIUM 8.4*  MG 1.7  AST 38  ALT 36  ALKPHOS 74  BILITOT 0.4   ------------------------------------------------------------------------------------------------------------------  Cardiac Enzymes No results for input(s): TROPONINI in the last 168 hours. ------------------------------------------------------------------------------------------------------------------  RADIOLOGY:  Dg Chest Port 1 View  Result Date: 02/28/2018 CLINICAL DATA:   CHF EXAM: PORTABLE CHEST 1 VIEW COMPARISON:  02/26/2018 FINDINGS: Tracheostomy in good position and unchanged. Diffuse bilateral airspace disease unchanged from the prior study. Bibasilar atelectasis. Small left effusion. Right arm PICC tip in the SVC unchanged. IMPRESSION: Diffuse bilateral airspace disease unchanged. Mild left lower lobe atelectasis and effusion. Differential diagnosis includes ARDS, edema, and pneumonia. Electronically Signed   By: Marlan Palau M.D.   On: 02/28/2018 07:41   Dg Chest Port 1 View  Result Date: 02/26/2018 CLINICAL DATA:  Pneumonia. EXAM: PORTABLE CHEST 1 VIEW COMPARISON:  02/25/2018 FINDINGS: Tracheostomy tube overlies the airway. A right PICC terminates over the lower SVC. The cardiomediastinal silhouette is unchanged. Diffuse airspace opacity throughout both lungs has not significantly changed. No large pleural effusion or pneumothorax is identified. No acute osseous abnormality is seen. IMPRESSION: Unchanged diffuse airspace disease which may reflect ARDS. Electronically Signed   By: Sebastian Ache M.D.   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  1.  Severe hypoxic and hypercapnic respiratory failure/ARDS: Patient remains on vent support via tracheostomy Management as per intensivist  2.  Ventilator associated pneumonia with sepsis and Klebsiella: Patient had completed antibiotics however she had a fever on May 27 and cultures are positive for Klebsiella  Continue meropenem as per ID recommendations.  Follow-up on sensitivities.    3.  PAF: Patient is currently in sinus rhythm Continue digoxin and metoprolol Amiodarone discontinued due to worsening of respiratory status  4.  Acute on chronic systolic heart failure: Currently  euvolemic  continue Lasix as needed, lisinopril and metoprolol  5.  [name] evaluated by  psychiatry Continue valproic acid  2 Prozac He needs to be on Seroquel as this helps with his mood 6.  Recent GI bleed on PPI with negative endoscopy Monitor hemoglobin Continue PPI  7.  Acute kidney injury in the setting of sepsis which has resolved  8.  Diabetes: Continue Lantus with sliding scale  9.  Stage II decubitus buttock ulcer: Continue local wound care  10.  Nutrition: On tube feeds     Clinical social work assisting with placement  CODE STATUS: DNR  TOTAL TIME TAKING CARE OF THIS PATIENT: 21 minutes.     Discussed with Dr. Toni Amend   Cinch Ormond M.D on 02/28/2018 at 11:23 AM  Between 7am to 6pm - Pager - [phone] After 6pm go to [email or web address] - password [email or web address]  [facility]  Office  [phone]  CC: Primary care physician; Marcina Millard, MD  Note: This dictation was prepared with Dragon dictation along with smaller phrase technology. Any transcriptional errors that result from this process are unintentional.

## 2018-02-28 NOTE — Progress Notes (Signed)
Pharmacy Antibiotic Note  Ronal Maybury is a 57 y.o. male being treated for possible HCAP/ pneumonia. Patient has had extended/complicated stay at hospital. Pharmacy has been consulted for cefazolin. Patient has recurrent Klebsiella bacteremia.   Plan: Continue cefazolin 2 g IV Q8h.  Per ID, planning for 14 course of Abx.    Height:  (177.8 cm) Weight: 233 lb 14.5 oz (106.1 kg) IBW/kg (Calculated) : 73  Adjusted Body Weight: 98kg   Temp (24hrs), Avg:98.6 F (37 C), Min:98.4 F (36.9 C), Max:99 F (37.2 C)  Recent Labs  Lab 02/22/18 1334  02/24/18 0528 02/24/18 1226 02/24/18 1958 02/25/18 0425 02/26/18 0415 02/27/18 0418 02/28/18 0630  WBC 7.8  --  8.6  --   --  10.9*  --   --   --   CREATININE  --    < > 0.71  --   --  0.64 0.66 0.56* 0.55*  LATICACIDVEN  --   --   --  0.7 1.0  --   --   --   --    < > = values in this interval not displayed.    Estimated Creatinine Clearance: 127.2 mL/min (A) (by C-G formula based on SCr of 0.55 mg/dL (L)).    No Known Allergies  Antimicrobials this admission: Vancomycin 5/3 >> 5/10, 5/27 >> 5/28 Meropenem 5/3 >> 5/10, meropenem 5/28 >> 5/30 Anidulafungin 5/3 >> 5/6 Cefazolin 5/11 >> 5/13, 5/30 >>  Cefepime 5/27 >> 5/28  Microbiology results: 5/3 BCx: Klebsiella Pneumoniae 5/7 BCx: no growth  5/3, 5/25 UCx: NG  5/3 Tracheal Aspirate: mod GPC, GPR  4/20, 5/8 MRSA PCR: positive 5/16 Trach aspirate: Kleb pneumo 5/25 Trach aspirate: normal resp flora  5/27 Trach aspirate: GPC in pairs/chains, GNR 5/27 BCx: Klebsiella pneumoniae, sensitive to cefazolin  Thank you for allowing pharmacy to be a part of this patient's care.  Jakyron Fabro L, 02/28/2018 11:20 PM

## 2018-03-01 LAB — PHOSPHORUS: PHOSPHORUS: 3 mg/dL (ref 2.5–4.6)

## 2018-03-01 LAB — GLUCOSE, CAPILLARY
GLUCOSE-CAPILLARY: 123 mg/dL — AB (ref 65–99)
GLUCOSE-CAPILLARY: 130 mg/dL — AB (ref 65–99)
GLUCOSE-CAPILLARY: 178 mg/dL — AB (ref 65–99)
Glucose-Capillary: 127 mg/dL — ABNORMAL HIGH (ref 65–99)
Glucose-Capillary: 154 mg/dL — ABNORMAL HIGH (ref 65–99)
Glucose-Capillary: 133 mg/dL — ABNORMAL HIGH (ref 65–99)
Glucose-Capillary: 160 mg/dL — ABNORMAL HIGH (ref 65–99)

## 2018-03-01 LAB — BASIC METABOLIC PANEL
Anion gap: 7 (ref 5–15)
BUN: 21 mg/dL — AB (ref 6–20)
CO2: 30 mmol/L (ref 22–32)
CREATININE: 0.56 mg/dL — AB (ref 0.61–1.24)
Calcium: 8.2 mg/dL — ABNORMAL LOW (ref 8.9–10.3)
Chloride: 101 mmol/L (ref 101–111)
Glucose, Bld: 126 mg/dL — ABNORMAL HIGH (ref 65–99)
POTASSIUM: 3.7 mmol/L (ref 3.5–5.1)
SODIUM: 138 mmol/L (ref 135–145)

## 2018-03-01 LAB — CULTURE, BLOOD (ROUTINE X 2)
CULTURE: NO GROWTH
SPECIAL REQUESTS: ADEQUATE

## 2018-03-01 LAB — MAGNESIUM: MAGNESIUM: 1.8 mg/dL (ref 1.7–2.4)

## 2018-03-01 LAB — CALCIUM, IONIZED: Calcium, Ionized, Serum: 5.2 mg/dL (ref 4.5–5.6)

## 2018-03-01 MED ORDER — MAGNESIUM SULFATE IN D5W 1-5 GM/100ML-% IV SOLN
1.0000 g | Freq: Once | INTRAVENOUS | Status: AC
Start: 2018-03-01 — End: 2018-03-01
  Administered 2018-03-01: 1 g via INTRAVENOUS
  Filled 2018-03-01: qty 100

## 2018-03-01 MED ORDER — FUROSEMIDE 10 MG/ML IJ SOLN
20.0000 mg | Freq: Every day | INTRAMUSCULAR | Status: DC
Start: 2018-03-01 — End: 2018-03-17
  Administered 2018-03-01 – 2018-03-16 (×16): 20 mg via INTRAVENOUS
  Filled 2018-03-01 (×16): qty 2

## 2018-03-01 NOTE — Progress Notes (Signed)
Alert much of day. Mouths words and follows commands.Pt had klonopin as scheduled and one dose prn klonopin at 1215 for agitation and tachypnea. Tracheal secretions are white and thick. 30-40% fio2 on vent. UOP good

## 2018-03-01 NOTE — Progress Notes (Signed)
Pharmacy Electrolyte Monitoring Consult:  Pharmacy consulted to assist in monitoring and replacing electrolytes in this 57 y.o. male admitted on 01/06/2018 with Respiratory Distress Patient receiving Magnesium oxide 400 mg PO BID.  Patient received furosemide 20mg  IV x 3 doses on 5/30.    Labs:  Sodium (mmol/L)  Date Value  03/01/2018 138   Potassium (mmol/L)  Date Value  03/01/2018 3.7   Magnesium (mg/dL)  Date Value  16/10/960406/10/2017 1.8   Phosphorus (mg/dL)  Date Value  54/09/811906/10/2017 3.0   Calcium (mg/dL)  Date Value  14/78/295606/10/2017 8.2 (L)   Albumin (g/dL)  Date Value  21/30/865705/31/2019 2.2 (L)    Assessment/Plan: K 3.7, Phos 3.0, Mag 1.8  Scr 0.56 Patient on  magnesium 400mg  VT BID. Patient on digoxin. Will order Magnesium 1 gram IV x 1  Will continue to monitor and f/u AM labs.   Pharmacy will continue to monitor and adjust per consult.   Ira Busbin A, 03/01/2018 8:49 AM

## 2018-03-01 NOTE — Progress Notes (Signed)
[employer] [facility] [facility] [place] at [facility]   PATIENT NAME: Tommy Mathews    MR#:  [id number]  DATE OF BIRTH:  05-14-1961  SUBJECTIVE:   No acute events overnight.   REVIEW OF SYSTEMS:  Unable to obtain Tolerating Diet: yes   DRUG ALLERGIES:  No Known Allergies  VITALS:  Blood pressure 139/63, pulse 91, temperature 98.9 F (37.2 C), temperature source Oral, resp. rate (!) 40, height 5\' 10"  (1.778 m), weight 106.1 kg (233 lb 14.5 oz), SpO2 97 %.  PHYSICAL EXAMINATION:  Constitutional: Critically ill-appearing. No distress. HENT: Normocephalic..  Tracheostomy site is intact Eyes: Conjunctivae are normal. , no scleral icterus.  Neck:  Neck supple. No JVD. No tracheal deviation. CVS: RRR, S1/S2 +, no murmurs, no gallops, no carotid bruit.  Pulmonary: CTAB no wheezing today abdominal: Soft. BS +,  no distension, tenderness, rebound or guarding.  Musculoskeletal:1+ LEE/hands with mild edema  Neuro: Moves extremities  skin: Stage II decubitus ulcer buttock PSYCH seems to be at baseline   LABORATORY PANEL:   CBC Recent Labs  Lab 02/25/18 0425  02/27/18 1700  WBC 10.9*  --   --   HGB 8.4*   < > 8.4*  HCT 26.1*   < > 26.1*  PLT 151  --   --    < > = values in this interval not displayed.   ------------------------------------------------------------------------------------------------------------------  Chemistries  Recent Labs  Lab 02/28/18 0630 03/01/18 0806  NA 140 138  K 3.6 3.7  CL 100* 101  CO2 32 30  GLUCOSE 154* 126*  BUN 20 21*  CREATININE 0.55* 0.56*  CALCIUM 8.4* 8.2*  MG 1.7 1.8  AST 38  --   ALT 36  --   ALKPHOS 74  --   BILITOT 0.4  --    ------------------------------------------------------------------------------------------------------------------  Cardiac Enzymes No results for input(s): TROPONINI in the last 168  hours. ------------------------------------------------------------------------------------------------------------------  RADIOLOGY:  Dg Chest Port 1 View  Result Date: 02/28/2018 CLINICAL DATA:  CHF EXAM: PORTABLE CHEST 1 VIEW COMPARISON:  02/26/2018 FINDINGS: Tracheostomy in good position and unchanged. Diffuse bilateral airspace disease unchanged from the prior study. Bibasilar atelectasis. Small left effusion. Right arm PICC tip in the SVC unchanged. IMPRESSION: Diffuse bilateral airspace disease unchanged. Mild left lower lobe atelectasis and effusion. Differential diagnosis includes ARDS, edema, and pneumonia. Electronically Signed   By: Marlan Palauharles  Clark M.D.   On: 02/28/2018 07:41   ASSESSMENT AND PLAN:   57 year old male with history of TBI admitted for severe respiratory failure and severe shock with pneumonia.  1.  Severe hypoxic and hypercapnic respiratory failure/ARDS: Patient remains on vent support via tracheostomy Management as per intensivist  2.  Ventilator associated pneumonia with sepsis and Klebsiella: Patient had completed antibiotics however she had a fever on May 27 and cultures are positive for Klebsiella  Continue meropenem as per ID recommendations.     3.  PAF: Patient is currently in sinus rhythm Continue digoxin and metoprolol Amiodarone discontinued due to worsening of respiratory status  4.  Acute on chronic systolic heart failure: Currently euvolemic  continue Lasix as needed, lisinopril and metoprolol  5.  [name] evaluated by psychiatry Continue valproic acid  He needs to be on Seroquel as this helps with his mood  6.  Recent GI bleed on PPI with negative endoscopy Monitor hemoglobin Continue PPI  7.  Acute kidney injury in the setting of sepsis which has resolved  8.  Diabetes: Continue Lantus with sliding scale  9.  Stage II decubitus buttock ulcer: Continue local wound care  10.  Nutrition: On tube  feeds  Clinical social work assisting with placement  CODE STATUS: DNR  TOTAL TIME TAKING CARE OF THIS PATIENT: 21 minutes.     Enedina Finner M.D on 03/01/2018 at 1:01 PM  Between 7am to 6pm - Pager - [phone] After 6pm go to [email or web address] - password [email or web address]  [facility]  Office  [phone]  CC: Primary care physician; Marcina Millard, MD  Note: This dictation was prepared with Dragon dictation along with smaller phrase technology. Any transcriptional errors that result from this process are unintentional.

## 2018-03-01 NOTE — Progress Notes (Signed)
Name: Tommy Mathews MRN: [id number] DOB: [birth year]     CONSULTATION DATE: 01/06/2018  Subjective & objective: No major issues last night.  Less frequent episodes of restless  PAST MEDICAL HISTORY :   has a past medical history of Diabetes (HCC), GERD (gastroesophageal reflux disease), HLD (hyperlipidemia), and HTN (hypertension).  has a past surgical history that includes PEG placement (N/A, 01/21/2018) and Tracheostomy tube placement (N/A, 01/23/2018). Prior to Admission medications   Medication Sig Start Date End Date Taking? Authorizing Provider  atorvastatin (LIPITOR) 80 MG tablet Take 80 mg by mouth at bedtime.   Yes [provider]  divalproex (DEPAKOTE ER) 500 MG 24 hr tablet Take 500 mg by mouth 2 (two) times daily.   Yes [provider]  estradiol (ESTRACE) 2 MG tablet Take 2 mg by mouth daily.   Yes [provider]  FLUoxetine (PROZAC) 40 MG capsule Take 40 mg by mouth daily.   Yes [provider]  folic acid (FOLVITE) 800 MCG tablet Take 800 mcg by mouth daily.   Yes [provider]  hydrochlorothiazide (HYDRODIURIL) 50 MG tablet Take 50 mg by mouth daily.   Yes [provider]  lisinopril (PRINIVIL,ZESTRIL) 5 MG tablet Take 5 mg by mouth daily.   Yes [provider]  metFORMIN (GLUCOPHAGE) 1000 MG tablet Take 1,000 mg by mouth 2 (two) times daily with a meal.   Yes [provider]  methotrexate (RHEUMATREX) 2.5 MG tablet Take 15 mg by mouth once a week.   Yes [provider]  metoprolol tartrate (LOPRESSOR) 50 MG tablet Take 50 mg by mouth 2 (two) times daily.   Yes [provider]  Omega-3 Fatty Acids (FISH OIL) 1000 MG CAPS Take 1,000 mg by mouth daily.   Yes [provider]  QUEtiapine Fumarate (SEROQUEL PO) Take 500 mg by mouth 2 (two) times daily.   Yes [provider]  ranitidine (ZANTAC) 150 MG tablet Take 150 mg by mouth daily.   Yes [provider]    rivaroxaban (XARELTO) 10 MG TABS tablet Take 10 mg by mouth daily.   Yes [provider]  senna-docusate (SENOKOT-S) 8.6-50 MG tablet Take 2 tablets by mouth at bedtime.   Yes [provider]  sodium chloride 1 g tablet Take 1 g by mouth daily.   Yes [provider]   No Known Allergies  FAMILY HISTORY:  family history is not on file. SOCIAL HISTORY:  reports that he has been smoking cigarettes.  He started smoking about 19 years ago. He has a 40.00 pack-year smoking history. He has never used smokeless tobacco. He reports that he does not drink alcohol or use drugs.  REVIEW OF SYSTEMS:   Unable to obtain due to critical illness   VITAL SIGNS: Temp:  [98.4 F (36.9 C)-98.9 F (37.2 C)] 98.9 F (37.2 C) (06/01 0800) Pulse Rate:  [46-98] 93 (06/01 0816) Resp:  [0-43] 43 (06/01 0816) BP: (103-145)/(61-90) 117/73 (06/01 0400) SpO2:  [83 %-100 %] 83 % (06/01 0816) FiO2 (%):  [30 %-40 %] 40 % (06/01 0824) Weight:  [106.1 kg (233 lb 14.5 oz)] 106.1 kg (233 lb 14.5 oz) (06/01 0432)  Physical Examination: Awake andfollowingsimplecommands.  On vent, trach in place. PC, no distress. BEAE and no rales.small respiratory secretions. S1 & S2 audible and no murmur PEG in place, benign abdomen with feeble peristalses wastedextremities and no edema  ASSESSMENT / PLAN:  Acute on chronic respiratory failure s/p tracheostomy. ARDS(improved)with P/F135on 45%PEEP  of 5, on PC. -Monitor P/F and continue with vent support.  Altered mental status(improved). 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 CT head 5/25 no acute intracranial abnormality. -OptimizeValproic acid, Klonapin , Seroquel. Plan to avoid using iv meds as requested by accepting facility for LTAC.  Atelectasis and pneumonia.improvedBil. Airspacedisease pulmonary congestion.Treat for HCAP with Merop and follow with tracheal aspirate culture. -Monitor CXR + CBC + FIO2 and cultures. -Diuresis  to improve lung compliance.  Sepsis withEnterobacteriaceae,KlebPneumbacteremia.BLD Cult 5/27 remains -ve, Urine Cult 5/27 -ve and Sput 5/17 -ve -On Cefazolin as per ID -Follow with ID   PAF. HFrEF ECHO 12/2017 LV EF 20-25% -Currently in NSR. -Diuresis and ACE inhibitors.  GI bleeding. -ve endoscopy -PPI  DM -Glycemic control.  Sacral decub St/ III -Wound care  Anemia. -Monitor and keep HB > 7 gm/dl  DNR  DVT&GI prophylaxis.Continue with supportive care  Critical care time 35 min

## 2018-03-01 NOTE — Progress Notes (Signed)
Pharmacy Antibiotic Note  Tommy Mathews is a 57 y.o. male being treated for possible HCAP/ pneumonia. Patient has had extended/complicated stay at hospital. Pharmacy has been consulted for cefazolin. Patient has recurrent Klebsiella bacteremia.   Plan: Continue cefazolin 2 g IV Q8h.  Per ID, planning for 14 course of Abx.    Height: 5\' 10"  (177.8 cm) Weight: 233 lb 14.5 oz (106.1 kg) IBW/kg (Calculated) : 73  Adjusted Body Weight: 98kg   Temp (24hrs), Avg:98.6 F (37 C), Min:98.4 F (36.9 C), Max:98.9 F (37.2 C)  Recent Labs  Lab 02/22/18 1334  02/24/18 0528 02/24/18 1226 02/24/18 1958 02/25/18 0425 02/26/18 0415 02/27/18 0418 02/28/18 0630 03/01/18 0806  WBC 7.8  --  8.6  --   --  10.9*  --   --   --   --   CREATININE  --    < > 0.71  --   --  0.64 0.66 0.56* 0.55* 0.56*  LATICACIDVEN  --   --   --  0.7 1.0  --   --   --   --   --    < > = values in this interval not displayed.    Estimated Creatinine Clearance: 127.2 mL/min (A) (by C-G formula based on SCr of 0.56 mg/dL (L)).    No Known Allergies  Antimicrobials this admission: Vancomycin 5/3 >> 5/10, 5/27 >> 5/28 Meropenem 5/3 >> 5/10, meropenem 5/28 >> 5/30 Anidulafungin 5/3 >> 5/6 Cefazolin 5/11 >> 5/13, 5/30 >>  Cefepime 5/27 >> 5/28  Microbiology results: 5/3 BCx: Klebsiella Pneumoniae 5/7 BCx: no growth  5/3, 5/25 UCx: NG  5/3 Tracheal Aspirate: mod GPC, GPR  4/20, 5/8 MRSA PCR: positive 5/16 Trach aspirate: Kleb pneumo 5/25 Trach aspirate: normal resp flora  5/27 Trach aspirate: GPC in pairs/chains, GNR 5/27 BCx: Klebsiella pneumoniae, sensitive to cefazolin  Thank you for allowing pharmacy to be a part of this patient's care.  Saydi Kobel A, 03/01/2018 8:58 AM

## 2018-03-02 ENCOUNTER — Inpatient Hospital Stay: Payer: Medicaid Other

## 2018-03-02 LAB — GLUCOSE, CAPILLARY
GLUCOSE-CAPILLARY: 111 mg/dL — AB (ref 65–99)
GLUCOSE-CAPILLARY: 147 mg/dL — AB (ref 65–99)
Glucose-Capillary: 114 mg/dL — ABNORMAL HIGH (ref 65–99)
Glucose-Capillary: 117 mg/dL — ABNORMAL HIGH (ref 65–99)
Glucose-Capillary: 135 mg/dL — ABNORMAL HIGH (ref 65–99)
Glucose-Capillary: 147 mg/dL — ABNORMAL HIGH (ref 65–99)

## 2018-03-02 LAB — BASIC METABOLIC PANEL
Anion gap: 7 (ref 5–15)
BUN: 21 mg/dL — ABNORMAL HIGH (ref 6–20)
CHLORIDE: 102 mmol/L (ref 101–111)
CO2: 29 mmol/L (ref 22–32)
CREATININE: 0.54 mg/dL — AB (ref 0.61–1.24)
Calcium: 8.5 mg/dL — ABNORMAL LOW (ref 8.9–10.3)
GFR calc non Af Amer: >60 mL/min (ref >60)
GLUCOSE: 131 mg/dL — AB (ref 65–99)
Potassium: 4 mmol/L (ref 3.5–5.1)
Sodium: 138 mmol/L (ref 135–145)

## 2018-03-02 LAB — MAGNESIUM: Magnesium: 1.9 mg/dL (ref 1.7–2.4)

## 2018-03-02 LAB — PHOSPHORUS: Phosphorus: 3.6 mg/dL (ref 2.5–4.6)

## 2018-03-02 MED ORDER — CEFAZOLIN SODIUM-DEXTROSE 2-4 GM/100ML-% IV SOLN
2.0000 g | Freq: Three times a day (TID) | INTRAVENOUS | Status: AC
  Administered 2018-03-02 – 2018-03-09 (×23): 2 g via INTRAVENOUS
  Filled 2018-03-02 (×25): qty 100

## 2018-03-02 NOTE — Progress Notes (Signed)
PT Cancellation Note  Patient Details Name: Tommy Mathews MRN: [id number] DOB: January 19, 1961   Cancelled Treatment:    Reason Eval/Treat Not Completed: Fatigue/lethargy limiting ability to participate;Patient's level of consciousness.  Pt was unable to do therapy due to wakefulness, so will try again at a better time.   Ivar DrapeRuth E Paxton Binns 03/02/2018, 12:39 PM   Samul Dadauth Erasto Sleight, PT MS Acute Rehab Dept. Number: [facility] [id number] and [facility] [phone]

## 2018-03-02 NOTE — Progress Notes (Signed)
[employer] - [place] at [facility]   PATIENT NAME: Tommy Mathews    MR#:  [id number]  DATE OF BIRTH:  04-07-1961  SUBJECTIVE:   No acute events overnight.  FiO2 at 35% with PEEP of 7 REVIEW OF SYSTEMS:  Unable to obtain Tolerating Diet: yes   DRUG ALLERGIES:  No Known Allergies  VITALS:  Blood pressure (!) 141/81, pulse 79, temperature 98.5 F (36.9 C), temperature source Oral, resp. rate (!) 38, height 5\' 10"  (1.778 m), weight 107.1 kg (236 lb 1.8 oz), SpO2 100 %.  PHYSICAL EXAMINATION:  Constitutional: Critically ill-appearing. No distress. HENT: Normocephalic..  Tracheostomy site is intact Eyes: Conjunctivae are normal. , no scleral icterus.  Neck:  Neck supple. No JVD. No tracheal deviation. CVS: RRR, S1/S2 +, no murmurs, no gallops, no carotid bruit.  Pulmonary: CTAB no wheezing today abdominal: Soft. BS +,  no distension, tenderness, rebound or guarding.  Musculoskeletal:1+ LEE/hands with mild edema  Neuro: Moves extremities  skin: Stage II decubitus ulcer buttock PSYCH seems to be at baseline   LABORATORY PANEL:   CBC Recent Labs  Lab 02/25/18 0425  02/27/18 1700  WBC 10.9*  --   --   HGB 8.4*   < > 8.4*  HCT 26.1*   < > 26.1*  PLT 151  --   --    < > = values in this interval not displayed.   ------------------------------------------------------------------------------------------------------------------  Chemistries  Recent Labs  Lab 02/28/18 0630  03/02/18 0515  NA 140   < > 138  K 3.6   < > 4.0  CL 100*   < > 102  CO2 32   < > 29  GLUCOSE 154*   < > 131*  BUN 20   < > 21*  CREATININE 0.55*   < > 0.54*  CALCIUM 8.4*   < > 8.5*  MG 1.7   < > 1.9  AST 38  --   --   ALT 36  --   --   ALKPHOS 74  --   --   BILITOT 0.4  --   --    < > = values in this interval not displayed.   ------------------------------------------------------------------------------------------------------------------  Cardiac Enzymes No results for  input(s): TROPONINI in the last 168 hours. ------------------------------------------------------------------------------------------------------------------  RADIOLOGY:  Dg Chest Port 1 View  Result Date: 03/02/2018 CLINICAL DATA:  Pneumonia EXAM: PORTABLE CHEST 1 VIEW COMPARISON:  Feb 28, 2018 FINDINGS: The tracheostomy tube and right PICC line are stable. Diffuse bilateral patchy pulmonary opacities are stable. Stable cardiomediastinal silhouette. IMPRESSION: 1. Stable support apparatus. 2. Diffuse bilateral patchy pulmonary opacities are stable. Electronically Signed   By: Gerome Sam III M.D   On: 03/02/2018 07:13   ASSESSMENT AND PLAN:   57 year old male with history of TBI admitted for severe respiratory failure and severe shock with pneumonia.  1.  Severe hypoxic and hypercapnic respiratory failure/ARDS: Patient remains on vent support via tracheostomy Management as per intensivist, currently on FiO2 35%  2.  Sepsis secondary to ventilator associated pneumonia with  recurrent Klebsiella: Patient had completed antibiotics however she had a fever on May 27 and cultures are positive for Klebsiella -recurrent; patient is started on cefazolin we will continue 2 g IV every 8 hours as recommended by ID    3.  PAF: Patient is currently in sinus rhythm Continue digoxin and metoprolol Amiodarone discontinued due to worsening of respiratory status  4.  Acute on chronic systolic heart failure: Currently euvolemic  continue Lasix as needed, lisinopril and metoprolol  5.  [name] evaluated by psychiatry Continue valproic acid  He needs to be on Seroquel as this helps with his mood  6.  Recent GI bleed on PPI with negative endoscopy Monitor hemoglobin Continue PPI  7.  Acute kidney injury in the setting of sepsis which has resolved  8.  Diabetes: Continue Lantus with sliding scale  9.  Stage II decubitus buttock ulcer: Continue local wound care  10.   Nutrition: On tube feeds  Clinical social work assisting with placement  CODE STATUS: DNR  TOTAL TIME TAKING CARE OF THIS PATIENT: 21 minutes.     Ramonita LabAruna Taha Dimond M.D on 03/02/2018 at 10:38 PM  Between 7am to 6pm - Pager - [phone] After 6pm go to [email or web address] - password [email or web address]  [facility] [place] [facility]  Office  [phone]  CC: Primary care physician; Marcina MillardParaschos, Alexander, MD  Note: This dictation was prepared with Dragon dictation along with smaller phrase technology. Any transcriptional errors that result from this process are unintentional.

## 2018-03-02 NOTE — Progress Notes (Addendum)
Patient has been awake and alert most of day and has actively participated in his care. He has helped turn, completed ROM exercises, helped with bath and mouthcare. Vent setting have improved from an Fi02 of 40 % to 30 % and a PEEP of 8 to 7, while mainating sats of 94-97 % . PT consult, but pt too lethargic to participate  . Family updated

## 2018-03-02 NOTE — Progress Notes (Signed)
Pharmacy Antibiotic Note  Tommy Mathews is a 57 y.o. male being treated for possible HCAP/ pneumonia. Patient has had extended/complicated stay at hospital. Pharmacy has been consulted for cefazolin. Patient has recurrent Klebsiella bacteremia.   Plan: Continue cefazolin 2 g IV Q8h.  Per ID, planning for 14 course of Abx.    Height: 5\' 10"  (177.8 cm) Weight: 236 lb 1.8 oz (107.1 kg) IBW/kg (Calculated) : 73  Adjusted Body Weight: 98kg   Temp (24hrs), Avg:98.8 F (37.1 C), Min:98.4 F (36.9 C), Max:99.1 F (37.3 C)  Recent Labs  Lab 02/24/18 0528 02/24/18 1226 02/24/18 1958 02/25/18 0425 02/26/18 0415 02/27/18 0418 02/28/18 0630 03/01/18 0806 03/02/18 0515  WBC 8.6  --   --  10.9*  --   --   --   --   --   CREATININE 0.71  --   --  0.64 0.66 0.56* 0.55* 0.56* 0.54*  LATICACIDVEN  --  0.7 1.0  --   --   --   --   --   --     Estimated Creatinine Clearance: 127.8 mL/min (A) (by C-G formula based on SCr of 0.54 mg/dL (L)).    No Known Allergies  Antimicrobials this admission: Vancomycin 5/3 >> 5/10, 5/27 >> 5/28 Meropenem 5/3 >> 5/10, meropenem 5/28 >> 5/30 Anidulafungin 5/3 >> 5/6 Cefazolin 5/11 >> 5/13, 5/30 >>  Cefepime 5/27 >> 5/28  Microbiology results: 5/3 BCx: Klebsiella Pneumoniae 5/7 BCx: no growth  5/3, 5/25 UCx: NG  5/3 Tracheal Aspirate: mod GPC, GPR  4/20, 5/8 MRSA PCR: positive 5/16 Trach aspirate: Kleb pneumo 5/25 Trach aspirate: normal resp flora  5/27 Trach aspirate: GPC in pairs/chains, GNR 5/27 BCx: Klebsiella pneumoniae, sensitive to cefazolin  Thank you for allowing pharmacy to be a part of this patient's care.  Victoriano Campion A, 03/02/2018 2:12 PM

## 2018-03-02 NOTE — Progress Notes (Addendum)
Name: Tommy Mathews MRN: [id number] DOB: 1960/12/11     CONSULTATION DATE: 01/06/2018 Subjective & objective: No major issues last night.  PAST MEDICAL HISTORY :   has a past medical history of Diabetes (HCC), GERD (gastroesophageal reflux disease), HLD (hyperlipidemia), and HTN (hypertension).  has a past surgical history that includes PEG placement (N/A, 01/21/2018) and Tracheostomy tube placement (N/A, 01/23/2018). Prior to Admission medications   Medication Sig Start Date End Date Taking? Authorizing Provider  atorvastatin (LIPITOR) 80 MG tablet Take 80 mg by mouth at bedtime.   Yes [provider]  divalproex (DEPAKOTE ER) 500 MG 24 hr tablet Take 500 mg by mouth 2 (two) times daily.   Yes [provider]  estradiol (ESTRACE) 2 MG tablet Take 2 mg by mouth daily.   Yes [provider]  FLUoxetine (PROZAC) 40 MG capsule Take 40 mg by mouth daily.   Yes [provider]  folic acid (FOLVITE) 800 MCG tablet Take 800 mcg by mouth daily.   Yes [provider]  hydrochlorothiazide (HYDRODIURIL) 50 MG tablet Take 50 mg by mouth daily.   Yes [provider]  lisinopril (PRINIVIL,ZESTRIL) 5 MG tablet Take 5 mg by mouth daily.   Yes [provider]  metFORMIN (GLUCOPHAGE) 1000 MG tablet Take 1,000 mg by mouth 2 (two) times daily with a meal.   Yes [provider]  methotrexate (RHEUMATREX) 2.5 MG tablet Take 15 mg by mouth once a week.   Yes [provider]  metoprolol tartrate (LOPRESSOR) 50 MG tablet Take 50 mg by mouth 2 (two) times daily.   Yes [provider]  Omega-3 Fatty Acids (FISH OIL) 1000 MG CAPS Take 1,000 mg by mouth daily.   Yes [provider]  QUEtiapine Fumarate (SEROQUEL PO) Take 500 mg by mouth 2 (two) times daily.   Yes [provider]  ranitidine (ZANTAC) 150 MG tablet Take 150 mg by mouth daily.   Yes [provider]  rivaroxaban (XARELTO) 10 MG TABS tablet  Take 10 mg by mouth daily.   Yes [provider]  senna-docusate (SENOKOT-S) 8.6-50 MG tablet Take 2 tablets by mouth at bedtime.   Yes [provider]  sodium chloride 1 g tablet Take 1 g by mouth daily.   Yes [provider]   No Known Allergies  FAMILY HISTORY:  family history is not on file. SOCIAL HISTORY:  reports that he has been smoking cigarettes.  He started smoking about 19 years ago. He has a 40.00 pack-year smoking history. He has never used smokeless tobacco. He reports that he does not drink alcohol or use drugs.  REVIEW OF SYSTEMS:   Unable to obtain due to critical illness   VITAL SIGNS: Temp:  [98.4 F (36.9 C)-98.9 F (37.2 C)] 98.4 F (36.9 C) (06/02 0704) Pulse Rate:  [50-95] 88 (06/02 0929) Resp:  [24-43] 42 (06/02 0700) BP: (105-162)/(57-96) 147/91 (06/02 0931) SpO2:  [91 %-100 %] 98 % (06/02 0313) FiO2 (%):  [30 %-40 %] 35 % (06/02 1000) Weight:  [107.1 kg (236 lb 1.8 oz)] 107.1 kg (236 lb 1.8 oz) (06/02 0409)  Physical Examination:  Awakeandfollowingcommands. On vent, trach in place. PC, no distress. BEAE and no rales.smallrespiratory secretions. S1 & S2 audible and no murmur PEG in place, benign abdomen with feeble peristalses wastedextremities and no edema  ASSESSMENT / PLAN:  Acute on chronic respiratory failure s/p tracheostomy. ARDS(improved)with P/F177on 35%PEEP of 5, on PC. -Monitor P/F and continue with vent  support.  Altered mental status(improved). 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 CT head 5/25 no acute intracranial abnormality. -OptimizeValproic acid,Klonapin, Seroquel. Plan to avoid using iv meds as requested by accepting facility for LTAC.  Atelectasis and pneumonia.improvedBil. Airspacedisease pulmonary congestion.on Ancef. -Monitor CXR + CBC + FIO2 and cultures. -Diuresis to improve lung compliance.  Sepsis withEnterobacteriaceae,KlebPneumbacteremia.BLD Cult 5/27 remains  + ve sensitive to Ancef , Urine Cult 5/27 -ve and Sput 5/17 -ve -c/w Cefazolin and  -Follow with ID   PAF. HFrEF ECHO 12/2017 LV EF 20-25% -Currently in NSR. -Diuresis and ACE inhibitors.  GI bleeding. -ve endoscopy -PPI  DM -Glycemic control.  Sacral decub St/ III -Wound care  Anemia. -Monitor and keep HB > 7 gm/dl  DNR  DVT&GI prophylaxis.Continue with supportive care  Critical care time 35 min

## 2018-03-02 NOTE — Progress Notes (Signed)
PT Cancellation Note  Patient Details Name: Tommy PangJames Huffstetler MRN: [id number] DOB: March 11, 1961   Cancelled Treatment:    Reason Eval/Treat Not Completed: Medical issues which prohibited therapy.  Nursing asks PT to wait as FiO2 just decreased from 40 to 35% and will get a blood gas later.  Will reattempt as pt and time allow.   Ivar DrapeRuth E Wilmot Quevedo 03/02/2018, 10:28 AM   Samul Dadauth Arlicia Paquette, PT MS Acute Rehab Dept. Number: [facility] [phone] and [facility] [phone]

## 2018-03-02 NOTE — Progress Notes (Signed)
Pharmacy Electrolyte Monitoring Consult:  Pharmacy consulted to assist in monitoring and replacing electrolytes in this 57 y.o. male admitted on 01/06/2018 with Respiratory Distress Patient receiving Magnesium oxide 400 mg PO BID.  Patient received furosemide 20mg  IV x 3 doses on 5/30.    Labs:  Sodium (mmol/L)  Date Value  03/02/2018 138   Potassium (mmol/L)  Date Value  03/02/2018 4.0   Magnesium (mg/dL)  Date Value  16/10/960406/11/2017 1.9   Phosphorus (mg/dL)  Date Value  54/09/811906/11/2017 3.6   Calcium (mg/dL)  Date Value  14/78/295606/11/2017 8.5 (L)   Albumin (g/dL)  Date Value  21/30/865705/31/2019 2.2 (L)    Assessment/Plan: No further supplementation at current time Patient on  magnesium 400mg  VT BID. Patient on digoxin.  Will continue to monitor and f/u AM labs.   Pharmacy will continue to monitor and adjust per consult.   Tasheika Kitzmiller A, 03/02/2018 11:39 AM

## 2018-03-03 ENCOUNTER — Inpatient Hospital Stay: Payer: Medicaid Other

## 2018-03-03 DIAGNOSIS — F3130 Bipolar disorder, current episode depressed, mild or moderate severity, unspecified: Secondary | ICD-10-CM

## 2018-03-03 DIAGNOSIS — F332 Major depressive disorder, recurrent severe without psychotic features: Secondary | ICD-10-CM

## 2018-03-03 LAB — BLOOD GAS, ARTERIAL
ACID-BASE EXCESS: 8.8 mmol/L — AB (ref 0.0–2.0)
Acid-Base Excess: 9.6 mmol/L — ABNORMAL HIGH (ref 0.0–2.0)
Bicarbonate: 34.8 mmol/L — ABNORMAL HIGH (ref 20.0–28.0)
Bicarbonate: 32.8 mmol/L — ABNORMAL HIGH (ref 20.0–28.0)
FIO2: 0.35
FIO2: 0.3
LHR: 18 {breaths}/min
O2 SAT: 92.5 %
O2 SAT: 92.5 %
PATIENT TEMPERATURE: 37
PCO2 ART: 50 mmHg — AB (ref 32.0–48.0)
PCO2 ART: 42 mmHg (ref 32.0–48.0)
PEEP: 7 cmH2O
PH ART: 7.45 (ref 7.350–7.450)
PH ART: 7.5 — AB (ref 7.350–7.450)
PO2 ART: 59 mmHg — AB (ref 83.0–108.0)
PRESSURE CONTROL: 14 cmH2O
Patient temperature: 37
Pressure control: 14 cmH2O
RATE: 18 resp/min
pO2, Arterial: 62 mmHg — ABNORMAL LOW (ref 83.0–108.0)

## 2018-03-03 LAB — CBC WITH DIFFERENTIAL/PLATELET
BASOS PCT: 1 %
Basophils Absolute: 0.2 10*3/uL — ABNORMAL HIGH (ref 0–0.1)
EOS ABS: 0.3 10*3/uL (ref 0–0.7)
EOS PCT: 2 %
HCT: 26.9 % — ABNORMAL LOW (ref 40.0–52.0)
HEMOGLOBIN: 8.5 g/dL — AB (ref 13.0–18.0)
Lymphocytes Relative: 23 %
Lymphs Abs: 2.9 10*3/uL (ref 1.0–3.6)
MCH: 27.1 pg (ref 26.0–34.0)
MCHC: 31.6 g/dL — AB (ref 32.0–36.0)
MCV: 85.8 fL (ref 80.0–100.0)
MONOS PCT: 15 %
Monocytes Absolute: 1.9 10*3/uL — ABNORMAL HIGH (ref 0.2–1.0)
Neutro Abs: 7.5 10*3/uL — ABNORMAL HIGH (ref 1.4–6.5)
Neutrophils Relative %: 59 %
PLATELETS: 291 10*3/uL (ref 150–440)
RBC: 3.13 MIL/uL — AB (ref 4.40–5.90)
RDW: 19.8 % — ABNORMAL HIGH (ref 11.5–14.5)
WBC: 12.7 10*3/uL — AB (ref 3.8–10.6)

## 2018-03-03 LAB — BASIC METABOLIC PANEL
Anion gap: 6 (ref 5–15)
BUN: 21 mg/dL — AB (ref 6–20)
CALCIUM: 8.2 mg/dL — AB (ref 8.9–10.3)
CHLORIDE: 100 mmol/L — AB (ref 101–111)
CO2: 29 mmol/L (ref 22–32)
CREATININE: 0.53 mg/dL — AB (ref 0.61–1.24)
Glucose, Bld: 120 mg/dL — ABNORMAL HIGH (ref 65–99)
Potassium: 4.1 mmol/L (ref 3.5–5.1)
SODIUM: 135 mmol/L (ref 135–145)

## 2018-03-03 LAB — MAGNESIUM: Magnesium: 1.8 mg/dL (ref 1.7–2.4)

## 2018-03-03 LAB — GLUCOSE, CAPILLARY
GLUCOSE-CAPILLARY: 159 mg/dL — AB (ref 65–99)
GLUCOSE-CAPILLARY: 104 mg/dL — AB (ref 65–99)
GLUCOSE-CAPILLARY: 127 mg/dL — AB (ref 65–99)
Glucose-Capillary: 109 mg/dL — ABNORMAL HIGH (ref 65–99)
Glucose-Capillary: 122 mg/dL — ABNORMAL HIGH (ref 65–99)
Glucose-Capillary: 138 mg/dL — ABNORMAL HIGH (ref 65–99)

## 2018-03-03 LAB — PHOSPHORUS: PHOSPHORUS: 3.8 mg/dL (ref 2.5–4.6)

## 2018-03-03 NOTE — Consult Note (Signed)
  Psychiatry: Patient seen chart reviewed.  Patient remains on ventilator not able to give any history.  Mostly calm.  Psychiatric medications currently continue as prescribed.  I have spoken several times over the last few days with members of the treatment team.  I am told that the facility that [place] going [place] in [place] has stated that he must have certain diagnoses to support his psychiatric medicines otherwise he will not be allowed to take them.  We do not have any past history on this patient or any direct information from him that would allow us to come to an absolutely definitive diagnosis.  Therefore I think it is fair to include diagnoses on his chart that seems most consistent with the medicines that he has been prescribed particularly since those medicines seem to be helpful and well-tolerated.  Therefore I have added diagnoses of major depression and bipolar disorder to his chart along with the diagnosis of schizophrenia that we had listed previously.

## 2018-03-03 NOTE — Progress Notes (Addendum)
Pharmacy Electrolyte Monitoring Consult:  Pharmacy consulted to assist in monitoring and replacing electrolytes in this 57 y.o. male admitted on 01/06/2018 with Respiratory Distress Patient receiving Magnesium oxide 400 mg PO BID.  Labs:  Sodium (mmol/L)  Date Value  03/03/2018 135   Potassium (mmol/L)  Date Value  03/03/2018 4.1   Magnesium (mg/dL)  Date Value  16/10/960406/11/2017 1.8   Phosphorus (mg/dL)  Date Value  54/09/811906/11/2017 3.8   Calcium (mg/dL)  Date Value  14/78/295606/11/2017 8.2 (L)   Albumin (g/dL)  Date Value  21/30/865705/31/2019 2.2 (L)    Assessment/Plan: No further supplementation warranted.   Will continue to monitor and f/u AM labs on 6/6.  Pharmacy will continue to monitor and adjust per consult.   Simpson,Michael L, 03/03/2018 11:14 PM

## 2018-03-03 NOTE — Evaluation (Signed)
Physical Therapy Evaluation Patient Details Name: Tommy Mathews MRN: [id number] DOB: [birth year] Today's Date: 03/03/2018   History of Present Illness  57 yo male presented to ER (4/8) secondary to respiratory distress (requiring intubation in ED); admitted with severe sepsis, shock due to multifocal PNA (possible aspiration event), UTI.  Mod amount of coffee-ground material suctioned through OG tube initially; suspected with GIB.  Hospital course further significant for difficulty weaning and persistent ventilator dyssynchrony, requiring administration of vecuronium; afib; PEG placement (4/23), trach placement (4/25); intermittent afib with RVR (controlled with medication as needed).  Required extended time for vent weaning; now tolerating trach collar this date.  Clinical Impression  Patient with marked improvement in alertness, ability to interact and participate compared to initial evaluation during this hospitalization.  Patient following 60-70% simple verbal commands, 80-90% with task demonstration; attempting to mouth words and communicate wants/needs to therapist as able.  Demonstrates some degree of active movement throughout all extremities, though increased weakness noted throughout R hemi-body (grossly 3-/5).  Mild preference towards cervical rotation/lateral flexion towards L, but corrects with max verbal cuing.  Currently dep for all positioning and bed mobility; will continue to assess/progress mobility in subsequent sessions as appropriate. Would benefit from skilled PT to address above deficits and promote optimal return to PLOF; recommend transition to STR upon discharge from acute hospitalization.     Follow Up Recommendations (vent SNF)    Equipment Recommendations       Recommendations for Other Services       Precautions / Restrictions Precautions Precautions: Fall Precaution Comments: NPO/PEG, trach Restrictions Weight Bearing Restrictions: No      Mobility  Bed  Mobility               General bed mobility comments: plan to assess mobility next date  Transfers                    Ambulation/Gait                Stairs            Wheelchair Mobility    Modified Rankin (Stroke Patients Only)       Balance                                             Pertinent Vitals/Pain Pain Assessment: Faces Faces Pain Scale: Hurts little more Pain Location: facial grimacing with L hip flexion towards 90 degrees Pain Descriptors / Indicators: Grimacing Pain Intervention(s): Limited activity within patient's tolerance;Repositioned;Monitored during session    Home Living Family/patient expects to be discharged to:: Group home                      Prior Function           Comments: Patient unable to provide accurate social history; will verify with family/facility as available.     Hand Dominance        Extremity/Trunk Assessment   Upper Extremity Assessment Upper Extremity Assessment: Generalized weakness(R UE grossly 3-/5, L UE grossly 4/5)    Lower Extremity Assessment Lower Extremity Assessment: Generalized weakness(R LE grossly 2+ to 3-/5, L LE grossly 3/5.  Mild difficulty with motor planning and purposeful control (R > L) at times.  Good ankle ROM, no clonus, negative babinski)    Cervical / Trunk Assessment Cervical / Trunk Assessment: (  preference towards cervical rotation and lateral flexion towards L; does mobilize to neutral and initiate rotation towards R with max cuing)  Communication   Communication: Tracheostomy  Cognition Arousal/Alertness: Awake/alert Behavior During Therapy: WFL for tasks assessed/performed                                   General Comments: Patient with trach but able to mouth name and attempts to mouth words/communicate but difficult to understand; inconsistent yes/no responses; able to follow simple verbal commands 60-75% time,  80-90% with task demonstration from therapist      General Comments      Exercises Other Exercises Other Exercises: Supine LE therex, 1x5-8, act assist ROM for strength/flexibility.  Constant manual/verbal cuing for attention to and initiate of isolated movement patterns.  Improved strength/control L > R LE. Other Exercises: Attempted to allow patient to write for improved communication-unable to maintain adequate grasp on standard pen (many benefit from built up handles), unable to write/formulate appropriate words. May benefit from picture board to maximize ability to communicate wants/needs.   Assessment/Plan    PT Assessment Patient needs continued PT services  PT Problem List Decreased strength;Decreased activity tolerance;Decreased mobility;Decreased cognition;Decreased safety awareness;Cardiopulmonary status limiting activity;Decreased skin integrity;Decreased range of motion;Decreased balance;Decreased coordination;Decreased knowledge of use of DME;Decreased knowledge of precautions       PT Treatment Interventions DME instruction;Gait training;Functional mobility training;Therapeutic activities;Therapeutic exercise;Balance training;Neuromuscular re-education;Cognitive remediation;Patient/family education    PT Goals (Current goals can be found in the Care Plan section)  Acute Rehab PT Goals PT Goal Formulation: Patient unable to participate in goal setting Time For Goal Achievement: 03/17/18 Potential to Achieve Goals: Fair Additional Goals Additional Goal #1: Assess and establish goals for OOB activities as medically appropriate.    Frequency Min 2X/week   Barriers to discharge Decreased caregiver support      Co-evaluation               AM-PAC PT "6 Clicks" Daily Activity  Outcome Measure Difficulty turning over in bed (including adjusting bedclothes, sheets and blankets)?: Unable Difficulty moving from lying on back to sitting on the side of the bed? :  Unable Difficulty sitting down on and standing up from a chair with arms (e.g., wheelchair, bedside commode, etc,.)?: Unable Help needed moving to and from a bed to chair (including a wheelchair)?: Total Help needed walking in hospital room?: Total Help needed climbing 3-5 steps with a railing? : Total 6 Click Score: 6    End of Session Equipment Utilized During Treatment: Oxygen Activity Tolerance: Patient tolerated treatment well Patient left: in bed;with call bell/phone within reach Nurse Communication: Mobility status PT Visit Diagnosis: Difficulty in walking, not elsewhere classified (R26.2);Muscle weakness (generalized) (M62.81)    Time: 1535-1600 PT Time Calculation (min) (ACUTE ONLY): 25 min   Charges:   PT Evaluation $PT Re-evaluation: 1 Re-eval PT Treatments $Therapeutic Exercise: 8-22 mins   PT G Codes:        Arriyanna Mersch H. Manson PasseyBrown, PT, DPT, NCS 03/03/18, 9:55 PM [phone]

## 2018-03-03 NOTE — Clinical Social Work Note (Signed)
CSW spoke with Boneta [facility]ucksJenny at [facility] and informed her that patient's schizophrenia diagnosis is old and thus he can be on seroquel. Patient has been on 45% fio2 for over 4 days. The only thing at this time that is holding up transfer is getting patient's check changed to be delivered to their facility. CSW was able to contact Dept of Social Services today and spoke with Carollee HerterShannon, our hospital medicaid liason was able to look patient up and see that patient's medicaid [facility] was [facility] [place]. CSW has notified Boneta LucksJenny so she can attempt to get this changed. York SpanielMonica Roshawn Lacina MSW,LCSW [phone]

## 2018-03-03 NOTE — Progress Notes (Signed)
Tolerating trach collar since around 1130 this morning.  Follows commands and interactive with staff.  Denies pain.

## 2018-03-03 NOTE — Progress Notes (Signed)
Name: Lj Miyamoto MRN: [id number] DOB: July 21, 1961     CONSULTATION DATE: 01/06/2018 Subjective & objective: No major issues last night.  PAST MEDICAL HISTORY :   has a past medical history of Diabetes (HCC), GERD (gastroesophageal reflux disease), HLD (hyperlipidemia), and HTN (hypertension).  has a past surgical history that includes PEG placement (N/A, 01/21/2018) and Tracheostomy tube placement (N/A, 01/23/2018). Prior to Admission medications   Medication Sig Start Date End Date Taking? Authorizing Provider  atorvastatin (LIPITOR) 80 MG tablet Take 80 mg by mouth at bedtime.   Yes [provider]  divalproex (DEPAKOTE ER) 500 MG 24 hr tablet Take 500 mg by mouth 2 (two) times daily.   Yes [provider]  estradiol (ESTRACE) 2 MG tablet Take 2 mg by mouth daily.   Yes [provider]  FLUoxetine (PROZAC) 40 MG capsule Take 40 mg by mouth daily.   Yes [provider]  folic acid (FOLVITE) 800 MCG tablet Take 800 mcg by mouth daily.   Yes [provider]  hydrochlorothiazide (HYDRODIURIL) 50 MG tablet Take 50 mg by mouth daily.   Yes [provider]  lisinopril (PRINIVIL,ZESTRIL) 5 MG tablet Take 5 mg by mouth daily.   Yes [provider]  metFORMIN (GLUCOPHAGE) 1000 MG tablet Take 1,000 mg by mouth 2 (two) times daily with a meal.   Yes [provider]  methotrexate (RHEUMATREX) 2.5 MG tablet Take 15 mg by mouth once a week.   Yes [provider]  metoprolol tartrate (LOPRESSOR) 50 MG tablet Take 50 mg by mouth 2 (two) times daily.   Yes [provider]  Omega-3 Fatty Acids (FISH OIL) 1000 MG CAPS Take 1,000 mg by mouth daily.   Yes [provider]  QUEtiapine Fumarate (SEROQUEL PO) Take 500 mg by mouth 2 (two) times daily.   Yes [provider]  ranitidine (ZANTAC) 150 MG tablet Take 150 mg by mouth daily.   Yes [provider]  rivaroxaban (XARELTO) 10 MG TABS tablet  Take 10 mg by mouth daily.   Yes [provider]  senna-docusate (SENOKOT-S) 8.6-50 MG tablet Take 2 tablets by mouth at bedtime.   Yes [provider]  sodium chloride 1 g tablet Take 1 g by mouth daily.   Yes [provider]   No Known Allergies  FAMILY HISTORY:  family history is not on file. SOCIAL HISTORY:  reports that he has been smoking cigarettes.  He started smoking about 19 years ago. He has a 40.00 pack-year smoking history. He has never used smokeless tobacco. He reports that he does not drink alcohol or use drugs.  REVIEW OF SYSTEMS:   Unable to obtain due to critical illness   VITAL SIGNS: Temp:  [98.5 F (36.9 C)-99.1 F (37.3 C)] 98.6 F (37 C) (06/03 0400) Pulse Rate:  [73-97] 83 (06/03 0600) Resp:  [18-42] 40 (06/03 0600) BP: (113-173)/(71-115) 148/99 (06/03 0600) SpO2:  [96 %-100 %] 100 % (06/03 0600) FiO2 (%):  [30 %-40 %] 40 % (06/03 0355) Weight:  [227 lb 15.3 oz (103.4 kg)] 227 lb 15.3 oz (103.4 kg) (06/03 0451)  Physical Examination:  Awakeandfollowingcommands. On vent, trach in place. PC, no distress. BEAE and no rales.smallrespiratory secretions. S1 & S2 audible and no murmur PEG in place, benign abdomen with feeble peristalses wastedextremities and no edema  ASSESSMENT / PLAN:  Acute on chronic respiratory failure s/p tracheostomy. ARDS(improved)with P/F177on 35%PEEP of 5, on PC. Monitor P/F and continue with vent  support.  Altered mental status(improved). 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 CT head 5/25 no acute intracranial abnormality. OptimizeValproic acid,Klonapin, Seroquel. Plan to avoid using iv meds as requested by accepting facility for LTAC.  Atelectasis and pneumonia.improvedBil. Airspacedisease pulmonary congestion.on Ancef. Monitor CXR + CBC + FIO2 and cultures. Diuresis to improve lung compliance.  Sepsis withEnterobacteriaceae,KlebPneumbacteremia.BLD Cult 5/27 remains +  ve sensitive to Ancef , Urine Cult 5/27 -ve and Sput 5/17 -ve on Ancef, Follow with ID   PAF. HFrEF ECHO 12/2017 LV EF 20-25%,Currently in NSR,Diuresis and ACE inhibitors.  GI bleeding. -ve endoscopy -PPI  DM. Glycemic control.  Sacral decub. Wound care   Patient ID: Zorita PangJames Hottinger, male   DOB: 10-02-1960, 57 y.o.   MRN: [id number]

## 2018-03-03 NOTE — Progress Notes (Signed)
[employer] - [place] at [facility]   PATIENT NAME: Tommy Mathews    MR#:  [id number]  DATE OF BIRTH:  [birth year]  SUBJECTIVE:   No overnight incidents FiO2 at 35%-45%  with PEEP of 7 REVIEW OF SYSTEMS:  Unable to obtain Tolerating Diet: yes   DRUG ALLERGIES:  No Known Allergies  VITALS:  Blood pressure 133/77, pulse 82, temperature 98.7 F (37.1 C), temperature source Oral, resp. rate (!) 26, height 5\' 10"  (1.778 m), weight 103.4 kg (227 lb 15.3 oz), SpO2 98 %.  PHYSICAL EXAMINATION:  Constitutional: Critically ill-appearing. No distress. HENT: Normocephalic..  Tracheostomy site is intact Eyes: Conjunctivae are normal. , no scleral icterus.  Neck:  Neck supple. No JVD. No tracheal deviation. CVS: RRR, S1/S2 +, no murmurs, no gallops, no carotid bruit.  Pulmonary: CTAB no wheezing today abdominal: Soft. BS +,  no distension, tenderness, rebound or guarding.  Musculoskeletal:1+ LEE/hands with mild edema  Neuro: Moves extremities  skin: Stage II decubitus ulcer buttock PSYCH seems to be at baseline   LABORATORY PANEL:   CBC Recent Labs  Lab 03/03/18 0541  WBC 12.7*  HGB 8.5*  HCT 26.9*  PLT 291   ------------------------------------------------------------------------------------------------------------------  Chemistries  Recent Labs  Lab 02/28/18 0630  03/03/18 0541  NA 140   < > 135  K 3.6   < > 4.1  CL 100*   < > 100*  CO2 32   < > 29  GLUCOSE 154*   < > 120*  BUN 20   < > 21*  CREATININE 0.55*   < > 0.53*  CALCIUM 8.4*   < > 8.2*  MG 1.7   < > 1.8  AST 38  --   --   ALT 36  --   --   ALKPHOS 74  --   --   BILITOT 0.4  --   --    < > = values in this interval not displayed.   ------------------------------------------------------------------------------------------------------------------  Cardiac Enzymes No results for input(s): TROPONINI in the last 168  hours. ------------------------------------------------------------------------------------------------------------------  RADIOLOGY:  Dg Chest Port 1 View  Result Date: 03/03/2018 CLINICAL DATA:  Pneumonia, tracheostomy patient, acute respiratory failure and sepsis EXAM: PORTABLE CHEST 1 VIEW COMPARISON:  Portable chest x-ray of March 02, 2018 FINDINGS: The lungs are well-expanded. Fluffy airspace opacities persist bilaterally but are less conspicuous today. There is no pneumothorax or pneumomediastinum. No significant pleural effusion is observed. The heart is top-normal in size. The pulmonary vascularity is not clearly engorged. The tracheostomy tube tip projects between the clavicular heads. The right-sided PICC line tip projects over the midportion of the SVC. IMPRESSION: Slight interval improvement in interstitial and alveolar opacities may reflect improving pneumonia. No overt pulmonary edema. Electronically Signed   By: David  [place]az[place] M.D.   On: 03/03/2018 07:18   Dg Chest Port 1 View  Result Date: 03/02/2018 CLINICAL DATA:  Pneumonia EXAM: PORTABLE CHEST 1 VIEW COMPARISON:  Feb 28, 2018 FINDINGS: The tracheostomy tube and right PICC line are stable. Diffuse bilateral patchy pulmonary opacities are stable. Stable cardiomediastinal silhouette. IMPRESSION: 1. Stable support apparatus. 2. Diffuse bilateral patchy pulmonary opacities are stable. Electronically Signed   By: Gerome Sam III M.D   On: 03/02/2018 07:13   ASSESSMENT AND PLAN:   57 year old male with history of TBI admitted for severe respiratory failure and severe shock with pneumonia.  1.  Severe hypoxic and hypercapnic respiratory failure/ARDS: Patient remains on vent support via tracheostomy Management as per  intensivist, currently on FiO2 35%-45 %   2.  Sepsis secondary to ventilator associated pneumonia with  recurrent Klebsiella: Patient had completed antibiotics however she had a fever on May 27 and cultures are positive for  Klebsiella -recurrent; patient is started on cefazolin we will continue 2 g IV every 8 hours as recommended by ID    3.  PAF: Patient is currently in sinus rhythm Continue digoxin and metoprolol Amiodarone discontinued due to worsening of respiratory status  4.  Acute on chronic systolic heart failure: Currently euvolemic  continue Lasix as needed, lisinopril and metoprolol  5.  [name] evaluated by psychiatry Continue valproic acid  He needs to be on Seroquel as this helps with his mood  6.  Recent GI bleed on PPI with negative endoscopy Monitor hemoglobin Continue PPI  7.  Acute kidney injury in the setting of sepsis which has resolved  8.  Diabetes: Continue Lantus with sliding scale  9.  Stage II decubitus buttock ulcer: Continue local wound care  10.  Nutrition: On tube feeds-  Disposition:our hospital medicaid liason was able to look patient up and see that patient's medicaid check [facility] [place]. CSW has notified    Clinical social work assisting with placement  CODE STATUS: DNR  TOTAL TIME TAKING CARE OF THIS PATIENT: 21 minutes.     Ramonita LabAruna Victorina Kable M.D on 03/03/2018 at 4:31 PM  Between 7am to 6pm - Pager - [phone] After 6pm go to [email or web address] - password [email or web address]  [facility]  Office  [phone]  CC: Primary care physician; Marcina MillardParaschos, Alexander, MD  Note: This dictation was prepared with Dragon dictation along with smaller phrase technology. Any transcriptional errors that result from this process are unintentional.

## 2018-03-04 LAB — GLUCOSE, CAPILLARY
Glucose-Capillary: 106 mg/dL — ABNORMAL HIGH (ref 65–99)
Glucose-Capillary: 116 mg/dL — ABNORMAL HIGH (ref 65–99)
Glucose-Capillary: 127 mg/dL — ABNORMAL HIGH (ref 65–99)
Glucose-Capillary: 129 mg/dL — ABNORMAL HIGH (ref 65–99)
Glucose-Capillary: 138 mg/dL — ABNORMAL HIGH (ref 65–99)
Glucose-Capillary: 140 mg/dL — ABNORMAL HIGH (ref 65–99)

## 2018-03-04 MED ORDER — GLUCERNA 1.5 CAL PO LIQD
1000.0000 mL | ORAL | Status: DC
  Administered 2018-03-04 – 2018-03-11 (×8): 1000 mL

## 2018-03-04 NOTE — Care Management (Addendum)
Trach collar trails appear to be going pretty good for patient; continue vent at night for now.  Care team trying to avoid IV medications to prepare for transition of care out of hospital setting to PO Rx (via gastric tube) regimen.

## 2018-03-04 NOTE — Progress Notes (Signed)
Rested during shift.  Awake this afternoon while sister visited at bedside.  Tolerating high flow via Tbar.  VSS.

## 2018-03-04 NOTE — Progress Notes (Signed)
Nutrition Follow-up  DOCUMENTATION CODES:   Obesity unspecified  INTERVENTION:  Will increase to new goal TF regimen of Glucerna 1.5 Cal at 70 mL/hr (1680 mL goal daily volume) via G-tube. Provides 2520 kcal, 139 grams of protein, 1277 mL H2O daily.  With free water flush of 150 mL Q8hrs patient will receive a total of 1727 mL H2O daily.  Continue Juven BID per tube, each supplement provides 80 kcal, 14 grams of amino acids, and vitamins/minerals needed for wound healing.  NUTRITION DIAGNOSIS:   Inadequate oral intake related to inability to eat as evidenced by NPO status.  Ongoing - addressing with TF regimen.  GOAL:   Provide needs based on ASPEN/SCCM guidelines  Met with TF regimen.  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   Patient tolerated tracheostomy collar for approximately 14 hours yesterday. Went back on ventilator overnight. Currently on High Flo2 heated T. bar per oxygen therapy flow sheet. FiO2 45%. Patient still pending placement at vent/SNF.  Access: 20 Fr G-tube with C-clamp placed 4/23; placement verified by relook endoscopy; 4.5 cm at external bumper  TF: pt continues to tolerate Glucerna 1.5 at 65 mL/hr; per pump history he received 1428 mL tube feeds in the past 24 hrs (91.5% goal daily volume)  Propofol: N/A  Medications reviewed and include: clonazepam, Colace, free water flush 150 mL Q8hrs, Lasix 20 mg daily IV, gabapentin, Novolog 0-20 units Q4hrs, Lantus 20 units QHS, lisinopril,  magnesium oxide 400 mg BID per tube, Juven BID, Protonix, Seroquel, valproic acid, cefazolin.  Labs reviewed: CBG 104-159 past 24 hrs.  I/O: 4300 mL UOP yesterday (1.8 mL/kr/hr); 1 BM yesterday  Weight trend: 99.4 kg on 6/4; pt is now -8 kg from admission  Discussed with RN and on rounds.  Diet Order:   Diet Order    None      EDUCATION NEEDS:   Not appropriate for education at this time  Skin:  Skin Assessment: Skin Integrity Issues: Skin Integrity Issues:: Stage II, Other (Comment) Stage II: coccyx (100% of wound base red or granulating) Other: open blister to bilateral thighs; MSAD to groin  Last BM:  03/03/2018 - large type 6  Height:   Ht Readings from Last 1 Encounters:  01/07/18 '5\' 10"'  (1.778 m)    Weight:   Wt Readings from Last 1 Encounters:  03/04/18 219 lb 2.2 oz (99.4 kg)    Ideal Body Weight:  75.5 kg  BMI:  Body mass index is 31.44 kg/m.  Estimated Nutritional Needs:   Kcal:  8466-5993 (MSJ x 1.2-1.4); 2485 (25 kcal/kg)  Protein:  113-135 grams (1.5-1.8 grams/kg IBW)  Fluid:  1.8 L/day  Willey Blade, MS, RD, LDN Office: [phone] Pager: [phone] After Hours/Weekend Pager: [phone]

## 2018-03-04 NOTE — Progress Notes (Signed)
[employer] - [place] at [facility]   PATIENT NAME: Tommy Mathews    MR#:  [id number]  DATE OF BIRTH:  [birth year]  SUBJECTIVE:   No overnight incidents FiO2 at 35%-45%  with PEEP of 7, no family members at bedside REVIEW OF SYSTEMS:  Unable to obtain Tolerating  tube feeds Glucerna  DRUG ALLERGIES:  No Known Allergies  VITALS:  Blood pressure (!) 98/55, pulse 89, temperature 99.3 F (37.4 C), temperature source Oral, resp. rate (!) 24, height 5\' 10"  (1.778 m), weight 99.4 kg (219 lb 2.2 oz), SpO2 94 %.  PHYSICAL EXAMINATION:  Constitutional: Critically ill-appearing. No distress. HENT: Normocephalic..  Tracheostomy site is intact Eyes: Conjunctivae are normal. , no scleral icterus.  Neck:  Neck supple. No JVD. No tracheal deviation. CVS: RRR, S1/S2 +, no murmurs, no gallops, no carotid bruit.  Pulmonary: CTAB no wheezing today abdominal: Soft. BS +,  no distension, tenderness, rebound or guarding.  Musculoskeletal:1+ LEE/hands with mild edema  Neuro: Moves extremities  skin: Stage II decubitus ulcer buttock PSYCH seems to be at baseline   LABORATORY PANEL:   CBC Recent Labs  Lab 03/03/18 0541  WBC 12.7*  HGB 8.5*  HCT 26.9*  PLT 291   ------------------------------------------------------------------------------------------------------------------  Chemistries  Recent Labs  Lab 02/28/18 0630  03/03/18 0541  NA 140   < > 135  K 3.6   < > 4.1  CL 100*   < > 100*  CO2 32   < > 29  GLUCOSE 154*   < > 120*  BUN 20   < > 21*  CREATININE 0.55*   < > 0.53*  CALCIUM 8.4*   < > 8.2*  MG 1.7   < > 1.8  AST 38  --   --   ALT 36  --   --   ALKPHOS 74  --   --   BILITOT 0.4  --   --    < > = values in this interval not displayed.   ------------------------------------------------------------------------------------------------------------------  Cardiac Enzymes No results for input(s): TROPONINI in the last 168  hours. ------------------------------------------------------------------------------------------------------------------  RADIOLOGY:  Dg Chest Port 1 View  Result Date: 03/03/2018 CLINICAL DATA:  Pneumonia, tracheostomy patient, acute respiratory failure and sepsis EXAM: PORTABLE CHEST 1 VIEW COMPARISON:  Portable chest x-ray of March 02, 2018 FINDINGS: The lungs are well-expanded. Fluffy airspace opacities persist bilaterally but are less conspicuous today. There is no pneumothorax or pneumomediastinum. No significant pleural effusion is observed. The heart is top-normal in size. The pulmonary vascularity is not clearly engorged. The tracheostomy tube tip projects between the clavicular heads. The right-sided PICC line tip projects over the midportion of the SVC. IMPRESSION: Slight interval improvement in interstitial and alveolar opacities may reflect improving pneumonia. No overt pulmonary edema. Electronically Signed   By: David  Swaziland M.D.   On: 03/03/2018 07:18   ASSESSMENT AND PLAN:   57 year old male with history of TBI admitted for severe respiratory failure and severe shock with pneumonia.  1.  Severe hypoxic and hypercapnic respiratory failure/ARDS: Patient remains on vent support via tracheostomy Management as per intensivist, currently on FiO2 35%-45 %   2.  Sepsis secondary to ventilator associated pneumonia with  recurrent Klebsiella: Patient had completed antibiotics however she had a fever on May 27 and cultures are positive for Klebsiella -recurrent; patient is started on cefazolin we will continue 2 g IV every 8 hours as recommended by ID    3.  PAF: Patient is currently  in sinus rhythm Continue digoxin and metoprolol Amiodarone discontinued due to worsening of respiratory status  4.  Acute on chronic systolic heart failure: Currently euvolemic  continue Lasix as needed, lisinopril and metoprolol  5.  [name] evaluated by psychiatry Continue  valproic acid  He needs to be on Seroquel as this helps with his mood  6.  Recent GI bleed on PPI with negative endoscopy Monitor hemoglobin Continue PPI  7.  Acute kidney injury in the setting of sepsis which has resolved  8.  Diabetes: Continue Lantus with sliding scale  9.  Stage II decubitus buttock ulcer: Continue local wound care  10.  Nutrition: On tube feeds-Glucerna  Disposition:our hospital medicaid liason was able to look patient up and see that patient's medicaid check was [facility] [place]. CSW has notified, his Medicaid check need to be transferred from the rehab center [place] new facility [place] where [place] go eventually.  Social workers are working     Physiological scientistClinical social work Conservation officer, natureassisting with placement  CODE STATUS: DNR  TOTAL TIME TAKING CARE OF THIS PATIENT: 21 minutes.     Ramonita LabAruna Vonne Mcdanel M.D on 03/04/2018 at 2:52 PM  Between 7am to 6pm - Pager - [phone] After 6pm go to [email or web address] - password [email or web address]  [facility]  Office  [phone]  CC: Primary care physician; Marcina MillardParaschos, Alexander, MD  Note: This dictation was prepared with Dragon dictation along with smaller phrase technology. Any transcriptional errors that result from this process are unintentional.

## 2018-03-04 NOTE — Evaluation (Signed)
Occupational Therapy Re-evaluation Patient Details Name: Tommy Mathews MRN: [id number] DOB: June 23, 1961 Today's Date: 03/04/2018    History of Present Illness 57 yo male presented to ER (4/8) secondary to respiratory distress (requiring intubation in ED); admitted with severe sepsis, shock due to multifocal PNA (possible aspiration event), UTI.  Mod amount of coffee-ground material suctioned through OG tube initially; suspected with GIB.  Hospital course further significant for difficulty weaning and persistent ventilator dyssynchrony, requiring administration of vecuronium; afib; PEG placement (4/23), trach placement (4/25); intermittent afib with RVR (controlled with medication as needed).  Required extended time for vent weaning; now tolerating trach collar this date.   Clinical Impression   Pt seen for OT re-evaluation this date. Pt has been in hospital since 4/8 and seen by rehabilitation services since 5/2. SW note indicates pt was previously at [facility] and [facility] (potentially resident). Currently pt demonstrates gross weakness of all extremities and trunk as well as continued difficulty with respiratory function. However, function improved as pt is now on trach collar trials with high flow oxygen. Pt with limited ROM secondary to gross weakness (R>L) requiring near total dependence to MAX A for all aspects of self care. Pt has required MAX A x2 for bed mobility and MAX A x3 for sup to sit on most recent transfer trials. Pt continues to benefit from skilled OT to address noted impairments and functional limitations (see below for any additional details) in order to maximize safety and independence while minimizing falls risk and caregiver burden.  Goals updated to reflect current status and progress. Upon hospital discharge, recommend pt discharge to SNF for continued follow up with skilled OT.    Follow Up Recommendations  SNF    Equipment Recommendations  Other  (comment)(TBD)    Recommendations for Other Services       Precautions / Restrictions Precautions Precautions: Fall Precaution Comments: NPO/PEG, trach Restrictions Weight Bearing Restrictions: No      Mobility Bed Mobility Overal bed mobility: Needs Assistance Bed Mobility: Supine to Sit;Rolling Rolling: Total assist            Transfers Overall transfer level: Needs assistance   Transfers: (supine to long sitting)           General transfer comment: Pt requires moderate assist to change position from supine with HOB > 30 degrees to long sitting with use of BUEs.     Balance Overall balance assessment: Needs assistance   Sitting balance-Leahy Scale: Poor Sitting balance - Comments: Pt required moderate assist with BUE support for modified long sitting in bed with HOB elevated.  Postural control: Posterior lean     Standing balance comment: Not appropriate to attempt at this time                           ADL either performed or assessed with clinical judgement   ADL Overall ADL's : Needs assistance/impaired Eating/Feeding: NPO Eating/Feeding Details (indicate cue type and reason): PEG tube Grooming: Bed level;Maximal assistance Grooming Details (indicate cue type and reason): max verbal cues and set up to initiate oral care with swab with R hand and max assist to continue through task completion including moving hand ant/post and laterally                   Toilet Transfer Details (indicate cue type and reason): unsafe to attempt Toileting- Clothing Manipulation and Hygiene: Total assistance Toileting - Clothing Manipulation Details (indicate cue  type and reason): urinary catheter             Vision   Additional Comments: Pt visually tracks OT's finger laterally, vertically and diagonally, difficulty to formally assess due to lethargy with minimal eye opening.      Perception     Praxis      Pertinent Vitals/Pain Pain  Assessment: Faces Faces Pain Scale: Hurts little more Pain Location: L hip Pain Descriptors / Indicators: Grimacing Pain Intervention(s): Limited activity within patient's tolerance     Hand Dominance Right   Extremity/Trunk Assessment Upper Extremity Assessment Upper Extremity Assessment: Generalized weakness RUE Deficits / Details: R UE grossly 2+/5 LUE Deficits / Details: L UE grossly 3-/5   Lower Extremity Assessment Lower Extremity Assessment: Defer to PT evaluation   Cervical / Trunk Assessment Cervical / Trunk Exceptions: L lateral lean   Communication Communication Communication: Tracheostomy   Cognition Arousal/Alertness: Lethargic Behavior During Therapy: Flat affect Overall Cognitive Status: Difficult to assess                                 General Comments: Able to follow one step verbal commands with 50% accuracy due to lethargy and need for re-direction, stimuli for arousal.    General Comments  IV, trach, PEG and foley monitored with all movement during re-eval    Exercises Other Exercises Other Exercises: moderate verbal cues for lateral movement of oral swab while participating in oral care due to lethargy and overall cognitive status.    Shoulder Instructions      Home Living                                   Additional Comments: Pt unable to provide detailed background information concerning prior living environment d/t limited communication w/ trach and cognitive status.      Prior Functioning/Environment          Comments: SW note indicates that pt was at [facility] prior. Unclear if this was place of residence or temporary placement for rehab.         OT Problem List: Decreased strength;Decreased range of motion;Decreased activity tolerance;Impaired balance (sitting and/or standing);Cardiopulmonary status limiting activity;Decreased cognition;Decreased safety awareness       OT Treatment/Interventions: Self-care/ADL training;Therapeutic activities;Therapeutic exercise;Energy conservation;Patient/family education;Balance training;Cognitive remediation/compensation    OT Goals(Current goals can be found in the care plan section) Acute Rehab OT Goals OT Goal Formulation: Patient unable to participate in goal setting Time For Goal Achievement: 03/18/18 Potential to Achieve Goals: Fair  OT Frequency: Min 2X/week   Barriers to D/C: Other (comment)  SW note indicates difficulty with d/c planning d/t delay in locating pt's dissibility check       Co-evaluation       OT goals addressed during session: ADL's and self-care;Strengthening/ROM      AM-PAC PT "6 Clicks" Daily Activity     Outcome Measure Help from another person eating meals?: Total Help from another person taking care of personal grooming?: A Lot Help from another person toileting, which includes using toliet, bedpan, or urinal?: Total Help from another person bathing (including washing, rinsing, drying)?: A Lot Help from another person to put on and taking off regular upper body clothing?: Total Help from another person to put on and taking off regular lower body clothing?: Total 6 Click Score: 8  End of Session    Activity Tolerance: Patient limited by lethargy Patient left: in bed;with bed alarm set;with call bell/phone within reach  OT Visit Diagnosis: Other abnormalities of gait and mobility (R26.89);Muscle weakness (generalized) (M62.81);Other symptoms and signs involving cognitive function                Time: [id number] OT Time Calculation (min): 28 min Charges:  OT General Charges $OT Visit: 1 Visit OT Evaluation $OT Re-eval: 1 Re-eval  Rejeana BrockAlison Symantha Steeber, MS, OTR/L ascom [phone] or [phone] 03/04/18, 4:20 PM

## 2018-03-04 NOTE — Progress Notes (Signed)
Name: Tommy Mathews MRN: [id number] DOB: 03/06/1961     CONSULTATION DATE: 01/06/2018 Subjective & objective: Patient did well yesterday, was able to be placed on trach collar trials  PAST MEDICAL HISTORY :   has a past medical history of Diabetes (HCC), GERD (gastroesophageal reflux disease), HLD (hyperlipidemia), and HTN (hypertension).  has a past surgical history that includes PEG placement (N/A, 01/21/2018) and Tracheostomy tube placement (N/A, 01/23/2018).   Prior to Admission medications   Medication Sig Start Date End Date Taking? Authorizing Provider  atorvastatin (LIPITOR) 80 MG tablet Take 80 mg by mouth at bedtime.   Yes [provider]  divalproex (DEPAKOTE ER) 500 MG 24 hr tablet Take 500 mg by mouth 2 (two) times daily.   Yes [provider]  estradiol (ESTRACE) 2 MG tablet Take 2 mg by mouth daily.   Yes [provider]  FLUoxetine (PROZAC) 40 MG capsule Take 40 mg by mouth daily.   Yes [provider]  folic acid (FOLVITE) 800 MCG tablet Take 800 mcg by mouth daily.   Yes [provider]  hydrochlorothiazide (HYDRODIURIL) 50 MG tablet Take 50 mg by mouth daily.   Yes [provider]  lisinopril (PRINIVIL,ZESTRIL) 5 MG tablet Take 5 mg by mouth daily.   Yes [provider]  metFORMIN (GLUCOPHAGE) 1000 MG tablet Take 1,000 mg by mouth 2 (two) times daily with a meal.   Yes [provider]  methotrexate (RHEUMATREX) 2.5 MG tablet Take 15 mg by mouth once a week.   Yes [provider]  metoprolol tartrate (LOPRESSOR) 50 MG tablet Take 50 mg by mouth 2 (two) times daily.   Yes [provider]  Omega-3 Fatty Acids (FISH OIL) 1000 MG CAPS Take 1,000 mg by mouth daily.   Yes [provider]  QUEtiapine Fumarate (SEROQUEL PO) Take 500 mg by mouth 2 (two) times daily.   Yes [provider]  ranitidine (ZANTAC) 150 MG tablet Take 150 mg by mouth daily.   Yes [provider]  rivaroxaban (XARELTO) 10 MG TABS tablet Take 10 mg by mouth daily.   Yes [provider]  senna-docusate (SENOKOT-S) 8.6-50 MG tablet Take 2 tablets by mouth at bedtime.   Yes [provider]  sodium chloride 1 g tablet Take 1 g by mouth daily.   Yes [provider]   No Known Allergies  FAMILY HISTORY:  family history is not on file. SOCIAL HISTORY:  reports that he has been smoking cigarettes.  He started smoking about 19 years ago. He has a 40.00 pack-year smoking history. He has never used smokeless tobacco. He reports that he does not drink alcohol or use drugs.  REVIEW OF SYSTEMS:   Unable to obtain due to critical illness   VITAL SIGNS: Temp:  [98.4 F (36.9 C)-99.2 F (37.3 C)] 99.2 F (37.3 C) (06/04 0740) Pulse Rate:  [71-95] 80 (06/04 0700) Resp:  [0-39] 24 (06/04 0700) BP: (117-153)/(72-114) 146/81 (06/04 0700) SpO2:  [81 %-100 %] 94 % (06/04 0700) FiO2 (%):  [40 %] 40 % (06/04 0207) Weight:  [219 lb 2.2 oz (99.4 kg)] 219 lb 2.2 oz (99.4 kg) (06/04 0500)  Physical Examination:  Awakeandfollowingcommands. Vital signs: Please see the above listed vital signs HEENT: Tracheostomy in place Cardiovascular: Regular rate and rhythm Pulmonary: Clear to auscultation Abdominal: Soft exam, PEG tube noted Extremities: Edema appreciated Neurologic: Patient is much more responsive and interactive and moves all extremities  ASSESSMENT / PLAN:  Acute on chronic respiratory failure s/p tracheostomy. Will continue trach collar trials throughout the day and mechanical ventilation at night  Altered mental status(improved). 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 OptimizeValproic acid,Klonapin, Seroquel. Plan to avoid using iv meds as requested by accepting facility for LTAC.  Atelectasis and pneumonia.improvedBil. Airspacedisease pulmonary congestion.on Ancef. Monitor CXR + CBC + FIO2 and cultures. Diuresis to improve lung  compliance.  Sepsis withEnterobacteriaceae,KlebPneumbacteremia.BLD Cult 5/27 remains + ve sensitive to Ancef , Urine Cult 5/27 -ve and Sput 5/17 -ve on Ancef, Follow with ID   PAF. HFrEF ECHO 12/2017 LV EF 20-25%,Currently in NSR,Diuresis and ACE inhibitors.  GI bleeding. -ve endoscopy -PPI  DM. Glycemic control.  Sacral decub. Wound care   Patient ID: Zorita PangJames Divelbiss, male   DOB: [birth year], 57 y.o.   MRN: [id number] Patient ID: Zorita PangJames Mcroberts, male   DOB: [birth year], 57 y.o.   MRN: [id number]

## 2018-03-04 NOTE — Clinical Social Work Note (Signed)
The current issue at hand now that is a discharge barrier is that patient's disability check cannot be obtained and this would have to be worked through prior to patient transitioning to vent/snf. Boneta LucksJenny with [facility] is also the admissions coordinator for [facility] and the only vent/snf bed available is at [place]. CSW was able to track down that patient's disability [facility] was [facility]. CSW spoke with Angelique Blonderenise at [facility] and she stated that their facility was bought out 01/29/18 and that patient's check was no longer being sent to them but is being sent to the prior company that owned them: [facility]. She provided the contact for them: Victoriano Laineresa Sheehan: [phone] and CSW provided this number to GalenaJenny with [facility] [place]. Boneta LucksJenny will call me back to update me regarding their process with patient's check.  York SpanielMonica Wateen Varon MSW,LCSW [phone]

## 2018-03-04 NOTE — Progress Notes (Signed)
Attempted Trach collar trial on patient with 40% fio2. Patients saturations progressively dropped despite increasing FIO2 up to 60% via TC. Sats dropped to 79% on 60% TC. Patient was subsequently placed baclk on vent on PSV of 10/7 and 40%.Baloon was reinflated for trach. Patients saturations increased to 95% and his WOB improved. Left on PSV and will continue to monitor.

## 2018-03-04 NOTE — Progress Notes (Signed)
PT Cancellation Note  Patient Details Name: Tommy Mathews MRN: [id number] DOB: 1961-06-09   Cancelled Treatment:    Reason Eval/Treat Not Completed: Fatigue/lethargy limiting ability to participate(Treatment session attempted. Patient sleeping soundly; only briefly opens eyes with max cuing/stimulation.  Unable to maintain alertness for full participation with session.  Appears to be more alert in AM; will plan to trial session tomorrow AM.)   Kellan Boehlke H. Manson PasseyBrown, PT, DPT, NCS 03/04/18, 2:27 PM [phone]

## 2018-03-05 LAB — COMPREHENSIVE METABOLIC PANEL
ALT: 12 U/L — AB (ref 17–63)
AST: 18 U/L (ref 15–41)
Albumin: 2.4 g/dL — ABNORMAL LOW (ref 3.5–5.0)
Alkaline Phosphatase: 61 U/L (ref 38–126)
Anion gap: 7 (ref 5–15)
BUN: 22 mg/dL — ABNORMAL HIGH (ref 6–20)
CHLORIDE: 101 mmol/L (ref 101–111)
CO2: 29 mmol/L (ref 22–32)
CREATININE: 0.66 mg/dL (ref 0.61–1.24)
Calcium: 8.6 mg/dL — ABNORMAL LOW (ref 8.9–10.3)
GFR calc non Af Amer: >60 mL/min (ref >60)
Glucose, Bld: 126 mg/dL — ABNORMAL HIGH (ref 65–99)
Potassium: 3.9 mmol/L (ref 3.5–5.1)
SODIUM: 137 mmol/L (ref 135–145)
Total Bilirubin: 0.3 mg/dL (ref 0.3–1.2)
Total Protein: 7.2 g/dL (ref 6.5–8.1)

## 2018-03-05 LAB — CBC WITH DIFFERENTIAL/PLATELET
BASOS ABS: 0 10*3/uL (ref 0–0.1)
Basophils Relative: 0 %
EOS ABS: 0.3 10*3/uL (ref 0–0.7)
EOS PCT: 3 %
HCT: 30.3 % — ABNORMAL LOW (ref 40.0–52.0)
Hemoglobin: 9.6 g/dL — ABNORMAL LOW (ref 13.0–18.0)
Lymphocytes Relative: 26 %
Lymphs Abs: 3.2 10*3/uL (ref 1.0–3.6)
MCH: 27.2 pg (ref 26.0–34.0)
MCHC: 31.5 g/dL — ABNORMAL LOW (ref 32.0–36.0)
MCV: 86.1 fL (ref 80.0–100.0)
Monocytes Absolute: 1.5 10*3/uL — ABNORMAL HIGH (ref 0.2–1.0)
Monocytes Relative: 13 %
Neutro Abs: 7.1 10*3/uL — ABNORMAL HIGH (ref 1.4–6.5)
Neutrophils Relative %: 58 %
PLATELETS: 371 10*3/uL (ref 150–440)
RBC: 3.52 MIL/uL — AB (ref 4.40–5.90)
RDW: 20.6 % — ABNORMAL HIGH (ref 11.5–14.5)
WBC: 12.1 10*3/uL — AB (ref 3.8–10.6)

## 2018-03-05 LAB — GLUCOSE, CAPILLARY
GLUCOSE-CAPILLARY: 114 mg/dL — AB (ref 65–99)
GLUCOSE-CAPILLARY: 129 mg/dL — AB (ref 65–99)
GLUCOSE-CAPILLARY: 157 mg/dL — AB (ref 65–99)
Glucose-Capillary: 146 mg/dL — ABNORMAL HIGH (ref 65–99)
Glucose-Capillary: 116 mg/dL — ABNORMAL HIGH (ref 65–99)
Glucose-Capillary: 123 mg/dL — ABNORMAL HIGH (ref 65–99)

## 2018-03-05 MED ORDER — ALTEPLASE 2 MG IJ SOLR
2.0000 mg | Freq: Once | INTRAMUSCULAR | Status: AC
  Administered 2018-03-05: 2 mg
  Filled 2018-03-05: qty 2

## 2018-03-05 MED ORDER — STERILE WATER FOR INJECTION IJ SOLN
INTRAMUSCULAR | Status: AC
  Administered 2018-03-05: 10 mL
  Filled 2018-03-05: qty 10

## 2018-03-05 NOTE — Progress Notes (Signed)
[facility] [place] at [facility]   PATIENT NAME: Tommy Mathews    MR#:  [id number]  DATE OF BIRTH:  [birth year]  SUBJECTIVE:   No overnight incidents FiO2 at 35%-45%  with PEEP of 7, pt is communicating on trach , asking cough med REVIEW OF SYSTEMS:  Unable to obtain Tolerating  tube feeds Glucerna  DRUG ALLERGIES:  No Known Allergies  VITALS:  Blood pressure 103/65, pulse 93, temperature 99 F (37.2 C), temperature source Oral, resp. rate (!) 34, height 5\' 10"  (1.778 m), weight 99.3 kg (218 lb 14.7 oz), SpO2 96 %.  PHYSICAL EXAMINATION:  Constitutional: Critically ill-appearing. No distress. HENT: Normocephalic..  Tracheostomy site is intact Eyes: Conjunctivae are normal. , no scleral icterus.  Neck:  Neck supple. No JVD. No tracheal deviation. CVS: RRR, S1/S2 +, no murmurs, no gallops, no carotid bruit.  Pulmonary: CTAB no wheezing today abdominal: Soft. BS +,  no distension, tenderness, rebound or guarding.  Musculoskeletal:1+ LEE/hands with mild edema  Neuro: Moves extremities  skin: Stage II decubitus ulcer buttock PSYCH seems to be at baseline   LABORATORY PANEL:   CBC Recent Labs  Lab 03/05/18 0902  WBC 12.1*  HGB 9.6*  HCT 30.3*  PLT 371   ------------------------------------------------------------------------------------------------------------------  Chemistries  Recent Labs  Lab 03/03/18 0541 03/05/18 0902  NA 135 137  K 4.1 3.9  CL 100* 101  CO2 29 29  GLUCOSE 120* 126*  BUN 21* 22*  CREATININE 0.53* 0.66  CALCIUM 8.2* 8.6*  MG 1.8  --   AST  --  18  ALT  --  12*  ALKPHOS  --  61  BILITOT  --  0.3   ------------------------------------------------------------------------------------------------------------------  Cardiac Enzymes No results for input(s): TROPONINI in the last 168 hours. ------------------------------------------------------------------------------------------------------------------  RADIOLOGY:   No results found. ASSESSMENT AND PLAN:   57 year old male with history of TBI admitted for severe respiratory failure and severe shock with pneumonia.  1.  Severe hypoxic and hypercapnic respiratory failure/ARDS: Patient remains on vent support via tracheostomy Management as per intensivist  2.  Sepsis secondary to ventilator associated pneumonia with  recurrent Klebsiella: Patient had completed antibiotics however she had a fever on May 27 and cultures are positive for Klebsiella -recurrent; patient is started on cefazolin we will continue 2 g IV every 8 hours as recommended by ID    3.  PAF: Patient is currently in sinus rhythm Continue digoxin and metoprolol Amiodarone discontinued due to worsening of respiratory status  4.  Acute on chronic systolic heart failure: Currently euvolemic  continue Lasix as needed, lisinopril and metoprolol  5.  [name] evaluated by psychiatry Continue valproic acid  He needs to be on Seroquel as this helps with his mood  6.  Recent GI bleed on PPI with negative endoscopy Monitor hemoglobin Continue PPI  7.  Acute kidney injury in the setting of sepsis which has resolved  8.  Diabetes: Continue Lantus with sliding scale  9.  Stage II decubitus buttock ulcer: Continue local wound care  10.  Nutrition: On tube feeds-Glucerna  Disposition:our hospital medicaid liason was able to look patient up and see that patient's medicaid check was still going to the [facility] and [facility] in [place]. CSW has notified, his Medicaid check need to be transferred from the rehab center to the new facility in [place] where he will go eventually.  Social workers are working     Clinical social work assisting with placement  CODE STATUS: DNR  TOTAL TIME TAKING CARE OF THIS PATIENT: 21 minutes.     Ramonita LabAruna Deandre Brannan M.D on 03/05/2018 at 4:05 PM  Between 7am to 6pm - Pager - [phone] After 6pm go to  [email or web address] - password [email or web address]  [facility] [place] [place] [facility]  Office  [phone]  CC: Primary care physician; Marcina MillardParaschos, Alexander, MD  Note: This dictation was prepared with Dragon dictation along with smaller phrase technology. Any transcriptional errors that result from this process are unintentional.

## 2018-03-05 NOTE — Progress Notes (Signed)
Pt had good day. More alert. He was so glad to work with PT. Wanting to wear his own clothes and not wear a gown. Assisted getting himself dressed. Wanted his beard shaved off. He has been on 40% high flow T bar all day. Secretions are thick white. He has very good cough effort. Denies pain. UOP good.

## 2018-03-05 NOTE — Progress Notes (Addendum)
Physical Therapy Treatment Patient Details Name: Tommy Mathews MRN: [id number] DOB: [birth year] Today's Date: 03/05/2018    History of Present Illness 57 yo male presented to ER (4/8) secondary to respiratory distress (requiring intubation in ED); admitted with severe sepsis, shock due to multifocal PNA (possible aspiration event), UTI.  Mod amount of coffee-ground material suctioned through OG tube initially; suspected with GIB.  Hospital course further significant for difficulty weaning and persistent ventilator dyssynchrony, requiring administration of vecuronium; afib; PEG placement (4/23), trach placement (4/25); intermittent afib with RVR (controlled with medication as needed).  Required extended time for vent weaning; now tolerating trach collar this date.    PT Comments    Signficant improvement in level of alertness and overall particiaption this date.  Demonstrates strength at least 3+ to 4-/5 in all extremities with purposeful, active movement noted throughout session.  Able to complete transition to unsupported sitting edge of bed, mod/max assist +2 this date; level of assist required for balance fluctuating from max to min/sup assist with dynamic balance training.  Did attempt partial sit/stand x2 reps, max assist +2 with bilat HHA. Vitals stable and WFL on TC throughout session. Excellent effort and participation with session; may consider OOB to chair next session if appropriate.    Follow Up Recommendations  (vent SNF)     Equipment Recommendations       Recommendations for Other Services       Precautions / Restrictions Precautions Precautions: Fall Precaution Comments: NPO/PEG, trach Restrictions Weight Bearing Restrictions: No    Mobility  Bed Mobility Overal bed mobility: Needs Assistance Bed Mobility: Supine to Sit;Sit to Supine     Supine to sit: Mod assist;+2 for physical assistance Sit to supine: Max assist;+2 for physical assistance   General bed  mobility comments: hand-over-hand to sequence and initiate movement  Transfers Overall transfer level: Needs assistance Equipment used: 2 person hand held assist Transfers: Sit to/from Stand Sit to Stand: Max assist;+2 physical assistance         General transfer comment: extensive assist for lift off from seating surface, anterior weight translation and standing balance.  Clears buttocks from seating surface, but unable to achieve full/neutral postural extension.  Ambulation/Gait             General Gait Details: unsafe/unable   Stairs             Wheelchair Mobility    Modified Rankin (Stroke Patients Only)       Balance Overall balance assessment: Needs assistance Sitting-balance support: No upper extremity supported;Feet supported Sitting balance-Leahy Scale: Fair Sitting balance - Comments: mild/mod pushing towards L with excessive posterior trunk lean/weight shift; level of assist fluctuates from max to min assist   Standing balance support: Bilateral upper extremity supported Standing balance-Leahy Scale: Zero Standing balance comment: max assist +2 for partial stance; dep for standing balance                            Cognition Arousal/Alertness: Awake/alert Behavior During Therapy: WFL for tasks assessed/performed Overall Cognitive Status: Difficult to assess                                 General Comments: consistentyl follows simple commands; mild perserveration at times.  Limited insight into deficits and need for assist evident.      Exercises Other Exercises Other Exercises: Unsupported sitting balance edge of  bed, R UE positioned on elevated pillows (to R lateral aspect) to reduce pushing behaviors.  Initially requiring max assist/cuing for R ant/lateral weight shift and truncal extension.  With progressive activity and dynamic R UE reaching (modified D2 diagonal) to improve midline orientation, improves sitting  balance to min assist (brief periods of close sup). Other Exercises: Partial sit/stand from edge of bed, max assist +2 with bilat HHA.  Extensive assist for lift off, anterior weight shift and postural extension.  Fully lifts/clears buttocks from bed surface, but unable to achieve full postural extension. Mod assist to block/stabilize bilat knees for safety/stability. Other Exercises: See OT note for additional detail regarding ADL performance during session.    General Comments        Pertinent Vitals/Pain Pain Assessment: Faces Faces Pain Scale: No hurt    Home Living                      Prior Function            PT Goals (current goals can now be found in the care plan section) Acute Rehab PT Goals PT Goal Formulation: Patient unable to participate in goal setting Time For Goal Achievement: 03/17/18 Potential to Achieve Goals: Fair Additional Goals Additional Goal #1: Assess and establish goals for OOB activities as medically appropriate. Progress towards PT goals: Progressing toward goals    Frequency    Min 2X/week      PT Plan Current plan remains appropriate    Co-evaluation PT/OT/SLP Co-Evaluation/Treatment: Yes Reason for Co-Treatment: Complexity of the patient's impairments (multi-system involvement);For patient/therapist safety;To address functional/ADL transfers PT goals addressed during session: Mobility/safety with mobility;Balance;Strengthening/ROM OT goals addressed during session: ADL's and self-care      AM-PAC PT "6 Clicks" Daily Activity  Outcome Measure  Difficulty turning over in bed (including adjusting bedclothes, sheets and blankets)?: Unable Difficulty moving from lying on back to sitting on the side of the bed? : Unable Difficulty sitting down on and standing up from a chair with arms (e.g., wheelchair, bedside commode, etc,.)?: Unable Help needed moving to and from a bed to chair (including a wheelchair)?: Total Help needed  walking in hospital room?: Total Help needed climbing 3-5 steps with a railing? : Total 6 Click Score: 6    End of Session Equipment Utilized During Treatment: Oxygen Activity Tolerance: Patient tolerated treatment well Patient left: in bed;with call bell/phone within reach;with bed alarm set Nurse Communication: Mobility status PT Visit Diagnosis: Difficulty in walking, not elsewhere classified (R26.2);Muscle weakness (generalized) (M62.81)     Time: [id number] PT Time Calculation (min) (ACUTE ONLY): 54 min  Charges:  $Therapeutic Activity: 8-22 mins $Neuromuscular Re-education: 8-22 mins                    G Codes:      Pascual Mantel H. Manson PasseyBrown, PT, DPT, NCS 03/05/18, 4:13 PM [phone]

## 2018-03-05 NOTE — Progress Notes (Signed)
Name: Tommy Mathews MRN: [id number] DOB: Oct 17, 1960     CONSULTATION DATE: 01/06/2018 Subjective & objective: Patient did well yesterday, was able to be placed on trach collar trials  PAST MEDICAL HISTORY :   has a past medical history of Diabetes (HCC), GERD (gastroesophageal reflux disease), HLD (hyperlipidemia), and HTN (hypertension).  has a past surgical history that includes PEG placement (N/A, 01/21/2018) and Tracheostomy tube placement (N/A, 01/23/2018).   Prior to Admission medications   Medication Sig Start Date End Date Taking? Authorizing Provider  atorvastatin (LIPITOR) 80 MG tablet Take 80 mg by mouth at bedtime.   Yes [provider]  divalproex (DEPAKOTE ER) 500 MG 24 hr tablet Take 500 mg by mouth 2 (two) times daily.   Yes [provider]  estradiol (ESTRACE) 2 MG tablet Take 2 mg by mouth daily.   Yes [provider]  FLUoxetine (PROZAC) 40 MG capsule Take 40 mg by mouth daily.   Yes [provider]  folic acid (FOLVITE) 800 MCG tablet Take 800 mcg by mouth daily.   Yes [provider]  hydrochlorothiazide (HYDRODIURIL) 50 MG tablet Take 50 mg by mouth daily.   Yes [provider]  lisinopril (PRINIVIL,ZESTRIL) 5 MG tablet Take 5 mg by mouth daily.   Yes [provider]  metFORMIN (GLUCOPHAGE) 1000 MG tablet Take 1,000 mg by mouth 2 (two) times daily with a meal.   Yes [provider]  methotrexate (RHEUMATREX) 2.5 MG tablet Take 15 mg by mouth once a week.   Yes [provider]  metoprolol tartrate (LOPRESSOR) 50 MG tablet Take 50 mg by mouth 2 (two) times daily.   Yes [provider]  Omega-3 Fatty Acids (FISH OIL) 1000 MG CAPS Take 1,000 mg by mouth daily.   Yes [provider]  QUEtiapine Fumarate (SEROQUEL PO) Take 500 mg by mouth 2 (two) times daily.   Yes [provider]  ranitidine (ZANTAC) 150 MG tablet Take 150 mg by mouth daily.   Yes [provider]  rivaroxaban (XARELTO) 10 MG TABS tablet Take 10 mg by mouth daily.   Yes [provider]  senna-docusate (SENOKOT-S) 8.6-50 MG tablet Take 2 tablets by mouth at bedtime.   Yes [provider]  sodium chloride 1 g tablet Take 1 g by mouth daily.   Yes [provider]   No Known Allergies  FAMILY HISTORY:  family history is not on file. SOCIAL HISTORY:  reports that he has been smoking cigarettes.  He started smoking about 19 years ago. He has a 40.00 pack-year smoking history. He has never used smokeless tobacco. He reports that he does not drink alcohol or use drugs.  REVIEW OF SYSTEMS:   Unable to obtain due to critical illness   VITAL SIGNS: Temp:  [98.7 F (37.1 C)-99.6 F (37.6 C)] 99 F (37.2 C) (06/05 0744) Pulse Rate:  [72-89] 78 (06/05 0700) Resp:  [21-40] 33 (06/05 0700) BP: (98-157)/(55-99) 142/88 (06/05 0700) SpO2:  [92 %-100 %] 97 % (06/05 0700) FiO2 (%):  [40 %-45 %] 40 % (06/05 0700) Weight:  [218 lb 14.7 oz (99.3 kg)] 218 lb 14.7 oz (99.3 kg) (06/05 0412)  Physical Examination:  Awakeandfollowingcommands. Vital signs: Please see the above listed vital signs HEENT: Tracheostomy in place Cardiovascular: Regular rate and rhythm Pulmonary: Clear to auscultation Abdominal: Soft exam, PEG tube noted Extremities: Edema appreciated Neurologic: Patient is much more responsive and interactive and moves all extremities  ASSESSMENT / PLAN:  Acute on chronic respiratory failure s/p tracheostomy. Will continue trach collar trials throughout the day and mechanical ventilation at night  Altered mental status(improved). 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 OptimizeValproic acid,Klonapin, Seroquel. Plan to avoid using iv meds as requested by accepting facility for LTAC.  Atelectasis and pneumonia.improvedBil. Airspacedisease pulmonary congestion.on Ancef. Monitor CXR + CBC + FIO2 and cultures. Diuresis to improve lung  compliance.  Sepsis withEnterobacteriaceae,KlebPneumbacteremia.BLD Cult 5/27 remains + ve sensitive to Ancef , Urine Cult 5/27 -ve and Sput 5/17 -ve on Ancef, Follow with ID   PAF. HFrEF ECHO 12/2017 LV EF 20-25%,Currently in NSR,Diuresis and ACE inhibitors.  GI bleeding. -ve endoscopy -PPI  DM. Glycemic control.  Sacral decub. Wound care   Patient ID: Tommy Mathews, male   DOB: 1961-06-22, 57 y.o.   MRN: [id number] Patient ID: Tommy Mathews, male   DOB: 1961-06-22, 57 y.o.   MRN: [id number] Patient ID: Tommy Mathews, male   DOB: 1961-06-22, 57 y.o.   MRN: [id number]

## 2018-03-05 NOTE — Progress Notes (Signed)
Occupational Therapy Treatment Patient Details Name: Tommy Mathews MRN: [id number] DOB: 11-Apr-1961 Today's Date: 03/05/2018    History of present illness 57 yo male presented to ER (4/8) secondary to respiratory distress (requiring intubation in ED); admitted with severe sepsis, shock due to multifocal PNA (possible aspiration event), UTI.  Mod amount of coffee-ground material suctioned through OG tube initially; suspected with GIB.  Hospital course further significant for difficulty weaning and persistent ventilator dyssynchrony, requiring administration of vecuronium; afib; PEG placement (4/23), trach placement (4/25); intermittent afib with RVR (controlled with medication as needed).  Required extended time for vent weaning; now tolerating trach collar this date.   OT comments  Pt seen for co-tx session with PT to address functional mobility and ADL. Pt follows basic commands well this date, occasionally perseverating. Pt demonstrating good initiation during self care to don boxers, sweatpants, and tshirt with assist (see below for detail). Pt required mod Ax2 from PT for sup>sit EOB with varying assist required for sitting balance (at one point SBA). Pt tolerated sitting EOB for extended period of time to perform grooming and dressing tasks. Able to lean forward while sitting EOB to initiate donning of LB clothing and requiring min assist to initiate donning of boxers over feet, mod-max assist to complete over hips while PT provided max +2 assist to stand. Pt motivated to participate this date, demonstrated excellent progress from initial evaluation and improved alertness and participation from re-evaluation completed the previous date. Will continue to progress. Vent SNF remains appropriate while pt remains on vent.    Follow Up Recommendations  SNF    Equipment Recommendations       Recommendations for Other Services      Precautions / Restrictions Precautions Precautions: Fall Precaution  Comments: NPO/PEG, trach Restrictions Weight Bearing Restrictions: No       Mobility Bed Mobility Overal bed mobility: Needs Assistance Bed Mobility: Supine to Sit;Sit to Supine     Supine to sit: Mod assist;+2 for physical assistance Sit to supine: Max assist;+2 for physical assistance   General bed mobility comments: see PT note for additional detail  Transfers Overall transfer level: Needs assistance Equipment used: 2 person hand held assist Transfers: Sit to/from Stand Sit to Stand: Max assist;+2 physical assistance         General transfer comment: see PT note for additional detail    Balance Overall balance assessment: Needs assistance Sitting-balance support: No upper extremity supported;Feet supported Sitting balance-Leahy Scale: Fair Sitting balance - Comments: mild/mod pushing towards L with excessive posterior trunk lean/weight shift; level of assist fluctuates from max to min assist Postural control: Posterior lean Standing balance support: Bilateral upper extremity supported Standing balance-Leahy Scale: Zero Standing balance comment: max assist +2 for partial stance; dep for standing balance                           ADL either performed or assessed with clinical judgement   ADL Overall ADL's : Needs assistance/impaired Eating/Feeding: NPO Eating/Feeding Details (indicate cue type and reason): PEG tube Grooming: Sitting;Set up;Min guard;Minimal assistance Grooming Details (indicate cue type and reason): CGA to min assist for sitting balance, but able to perform grooming tasks to wash face with set up of washcloth and PRN VC to improve posture         Upper Body Dressing : Sitting;Moderate assistance;Maximal assistance Upper Body Dressing Details (indicate cue type and reason): mod-max assist 2:2 fatigue near end of session and due  to all lines/leads/trach for safety Lower Body Dressing: Sit to/from stand;+2 for physical assistance;Moderate  assistance Lower Body Dressing Details (indicate cue type and reason): Pt able to sit EOB with PT providing varying physical assist for sitting balance and initiate donning of boxers and then sweatpants over feet with min assist to thread over feet and mod assist to complete donning over hips with PT providing +2 max assist to stand and OT providing assist to don pants                     Vision       Perception     Praxis      Cognition Arousal/Alertness: Awake/alert Behavior During Therapy: WFL for tasks assessed/performed Overall Cognitive Status: Difficult to assess                                 General Comments: consistently follows simple commands; mild perserveration at times.  Limited insight into deficits and need for assist evident.         Exercises Other Exercises Other Exercises: With PT providing assist for sitting balance (please see PT note for additional detail) pt participated in dynamic reaching activity to pull clothing out of his bag from tray table positioned to his right to minimize lateral L lean and improve midline orientation Other Exercises: Partial sit/stand from edge of bed, max assist +2 with bilat HHA.  Extensive assist for lift off, anterior weight shift and postural extension.  Fully lifts/clears buttocks from bed surface, but unable to achieve full postural extension. Mod assist to block/stabilize bilat knees for safety/stability. Other Exercises: See OT note for additional detail regarding ADL performance during session.   Shoulder Instructions       General Comments      Pertinent Vitals/ Pain       Pain Assessment: Faces Faces Pain Scale: No hurt Pain Intervention(s): Limited activity within patient's tolerance;Monitored during session  Home Living                                          Prior Functioning/Environment              Frequency  Min 2X/week        Progress Toward  Goals  OT Goals(current goals can now be found in the care plan section)  Progress towards OT goals: Progressing toward goals  Acute Rehab OT Goals OT Goal Formulation: Patient unable to participate in goal setting Time For Goal Achievement: 03/18/18 Potential to Achieve Goals: Good  Plan Discharge plan remains appropriate;Frequency remains appropriate    Co-evaluation    PT/OT/SLP Co-Evaluation/Treatment: Yes Reason for Co-Treatment: Complexity of the patient's impairments (multi-system involvement);For patient/therapist safety;To address functional/ADL transfers PT goals addressed during session: Mobility/safety with mobility;Balance;Strengthening/ROM OT goals addressed during session: ADL's and self-care      AM-PAC PT "6 Clicks" Daily Activity     Outcome Measure   Help from another person eating meals?: Total(PEG) Help from another person taking care of personal grooming?: A Little Help from another person toileting, which includes using toliet, bedpan, or urinal?: A Lot Help from another person bathing (including washing, rinsing, drying)?: A Lot Help from another person to put on and taking off regular upper body clothing?: A Lot Help from another person to put on and  taking off regular lower body clothing?: A Lot 6 Click Score: 12    End of Session    OT Visit Diagnosis: Other abnormalities of gait and mobility (R26.89);Muscle weakness (generalized) (M62.81);Other symptoms and signs involving cognitive function   Activity Tolerance Patient tolerated treatment well   Patient Left in bed;with bed alarm set;with call bell/phone within reach   Nurse Communication          Time: 1610-9604 OT Time Calculation (min): 54 min  Charges: OT General Charges $OT Visit: 1 Visit OT Treatments $Self Care/Home Management : 23-37 mins   Richrd Prime, MPH, MS, OTR/L ascom [phone] 03/05/18, 4:54 PM

## 2018-03-06 LAB — GLUCOSE, CAPILLARY
GLUCOSE-CAPILLARY: 140 mg/dL — AB (ref 65–99)
GLUCOSE-CAPILLARY: 138 mg/dL — AB (ref 65–99)
Glucose-Capillary: 115 mg/dL — ABNORMAL HIGH (ref 65–99)
Glucose-Capillary: 124 mg/dL — ABNORMAL HIGH (ref 65–99)
Glucose-Capillary: 149 mg/dL — ABNORMAL HIGH (ref 65–99)
Glucose-Capillary: 154 mg/dL — ABNORMAL HIGH (ref 65–99)

## 2018-03-06 MED ORDER — KETOCONAZOLE 2 % EX CREA
TOPICAL_CREAM | Freq: Every day | CUTANEOUS | Status: DC | PRN
Start: 2018-03-06 — End: 2018-03-19
  Administered 2018-03-09 – 2018-03-13 (×2): via TOPICAL
  Filled 2018-03-06: qty 15

## 2018-03-06 NOTE — Progress Notes (Signed)
Physical Therapy Treatment Patient Details Name: Tommy Mathews MRN: [id number] DOB: 18-Sep-1961 Today's Date: 03/06/2018    History of Present Illness 57 yo male presented to ER (4/8) secondary to respiratory distress (requiring intubation in ED); admitted with severe sepsis, shock due to multifocal PNA (possible aspiration event), UTI.  Mod amount of coffee-ground material suctioned through OG tube initially; suspected with GIB.  Hospital course further significant for difficulty weaning and persistent ventilator dyssynchrony, requiring administration of vecuronium; afib; PEG placement (4/23), trach placement (4/25); intermittent afib with RVR (controlled with medication as needed).  Required extended time for vent weaning; now tolerating trach collar this date.    PT Comments    Patient with continued eagerness for participation with session this date.  Does fatigue quickly, but puts forth good effort with all functional activities.  Significant elongation of R lateral trunk musculature noted in sitting/standing postures this date; requiring mod/max manual cuing throughout session for core strength/stabiltiy and midline orientation throughout session. May benefit from positioning to wedge/elevate R IT, promote closure of R lateral trunk. Was able to initiate/complete OOB to chair transfer this date, dep assist +2, for lateral/scoot pivot transfer over level surfaces.  Recommend return to bed via total lift with nursing when appropriate (sling taken to room for use when ready). May consider trial of tilt bed for continued WBing and progressive upright activities outside of therapy sessions.  Will discuss with team and initiate when appropriate.  Goals updated to reflect progress with mobility.    Follow Up Recommendations  (vent SNF)     Equipment Recommendations       Recommendations for Other Services       Precautions / Restrictions Precautions Precautions: Fall Precaution Comments:  NPO/PEG, trach Restrictions Weight Bearing Restrictions: No    Mobility  Bed Mobility Overal bed mobility: Needs Assistance Bed Mobility: Supine to Sit Rolling: Mod assist   Supine to sit: Max assist        Transfers Overall transfer level: Needs assistance Equipment used: Rolling walker (2 wheeled) Transfers: Sit to/from Stand Sit to Stand: Mod assist;Max assist;+2 physical assistance         General transfer comment: elevated bed surface to assist with movement transition; constant assist from therapist for standing balance, postural extension and upward gaze.  improved ability to actively initiate with bilat UE support on RW  Ambulation/Gait             General Gait Details: unsafe/unable   Stairs             Wheelchair Mobility    Modified Rankin (Stroke Patients Only)       Balance Overall balance assessment: Needs assistance Sitting-balance support: No upper extremity supported;Feet supported Sitting balance-Leahy Scale: Fair Sitting balance - Comments: excessive R lateral trunk elongation and downward tilt of R pelvis in unsupported sitting; decreased pushing towards L this date, but moderate sway,  LOB in all planes wiht dynamic activities.   Standing balance support: Bilateral upper extremity supported Standing balance-Leahy Scale: Zero Standing balance comment: mod/max assist +2 for sit/stand and static stance                            Cognition Arousal/Alertness: Awake/alert Behavior During Therapy: WFL for tasks assessed/performed  General Comments: consistently follows simple commands; mild perserveration at times.  Limited insight into deficits and need for assist evident.       Exercises Other Exercises Other Exercises: Rolling bilat for lower body dressing in supine, mod assist +1-2 bilat; max assist for threading pants and pulling over hips.  Patient with good efforts  to position self in hooklying with partial bridge to assist with pants in supine; unable to fully clear buttocks from bed surface Other Exercises: Unsupported sitting edge of bed, fluctuating from min to max assist throughout, for postural extension and midline orientation in all planes.  Incorporated bimanual coordination/fine motor activities with dynamic reaching to promote postural extension and closure of R lateral trunk this date.  Would benefit from wedge under R IT as able in subsequent sessions to promote neutral pelvic/postural alignment. Other Exercises: Sit/stand x3 with RW, mod/max assist +2 from elevated bed surface.  Improved active assist with postural extension with bilat UE support on RW.  Assist to block and facilitate extension of bilat knees, hips and trunk.  Fatigues quickly; unable to maintain >5-8 seconds each trial. Other Exercises: Lateral scoot pivot transfer, bed/chair, over level surfaces, dep assist +2 for safety.  Constant cuing/assist for trunk lean and trunk control, lateral movement and UE/LE placement throughout.  Wedge under R IT in sitting for neutral alignment    General Comments        Pertinent Vitals/Pain Pain Assessment: Faces Faces Pain Scale: No hurt    Home Living                      Prior Function            PT Goals (current goals can now be found in the care plan section) Acute Rehab PT Goals PT Goal Formulation: Patient unable to participate in goal setting Time For Goal Achievement: 03/17/18 Potential to Achieve Goals: Fair Progress towards PT goals: Progressing toward goals    Frequency    Min 2X/week      PT Plan Current plan remains appropriate    Co-evaluation              AM-PAC PT "6 Clicks" Daily Activity  Outcome Measure  Difficulty turning over in bed (including adjusting bedclothes, sheets and blankets)?: Unable Difficulty moving from lying on back to sitting on the side of the bed? :  Unable Difficulty sitting down on and standing up from a chair with arms (e.g., wheelchair, bedside commode, etc,.)?: Unable Help needed moving to and from a bed to chair (including a wheelchair)?: Total Help needed walking in hospital room?: Total Help needed climbing 3-5 steps with a railing? : Total 6 Click Score: 6    End of Session Equipment Utilized During Treatment: Gait belt;Oxygen Activity Tolerance: Patient tolerated treatment well Patient left: in chair;with call bell/phone within reach;with nursing/sitter in room(nursing at bedside; to apply alarm as available) Nurse Communication: Mobility status PT Visit Diagnosis: Difficulty in walking, not elsewhere classified (R26.2);Muscle weakness (generalized) (M62.81) Pain - Right/Left: Left Pain - part of body: Hip     Time: 0923-1021 PT Time Calculation (min) (ACUTE ONLY): 58 min  Charges:  $Therapeutic Activity: 23-37 mins $Neuromuscular Re-education: 23-37 mins                    G Codes:       Tersea Aulds H. Manson PasseyBrown, PT, DPT, NCS 03/06/18, 11:27 AM [phone]

## 2018-03-06 NOTE — Progress Notes (Signed)
Pharmacy Antibiotic Note  Tommy Mathews is a 57 y.o. male being treated for possible HCAP/ pneumonia. Patient has had extended/complicated stay at hospital. Pharmacy has been consulted for cefazolin. Patient has recurrent Klebsiella bacteremia.   Plan: Continue cefazolin 2 g IV Q8h.  Per ID, planning for 14 course of Abx.    Height: 5\' 10"  (177.8 cm) Weight: 218 lb 14.7 oz (99.3 kg) IBW/kg (Calculated) : 73  Adjusted Body Weight: 98kg   Temp (24hrs), Avg:98.6 F (37 C), Min:98.2 F (36.8 C), Max:99.8 F (37.7 C)  Recent Labs  Lab 02/28/18 0630 03/01/18 0806 03/02/18 0515 03/03/18 0541 03/05/18 0902  WBC  --   --   --  12.7* 12.1*  CREATININE 0.55* 0.56* 0.54* 0.53* 0.66    Estimated Creatinine Clearance: 123.2 mL/min (by C-G formula based on SCr of 0.66 mg/dL).    No Known Allergies  Antimicrobials this admission: Vancomycin 5/3 >> 5/10, 5/27 >> 5/28 Meropenem 5/3 >> 5/10, meropenem 5/28 >> 5/30 Anidulafungin 5/3 >> 5/6 Cefazolin 5/11 >> 5/13, 5/30 >>  Cefepime 5/27 >> 5/28  Microbiology results: 5/3 BCx: Klebsiella Pneumoniae 5/7 BCx: no growth  5/3, 5/25 UCx: NG  5/3 Tracheal Aspirate: mod GPC, GPR  4/20, 5/8 MRSA PCR: positive 5/16 Trach aspirate: Kleb pneumo 5/25 Trach aspirate: normal resp flora  5/27 Trach aspirate: GPC in pairs/chains, GNR 5/27 BCx: Klebsiella pneumoniae, sensitive to cefazolin  Thank you for allowing pharmacy to be a part of this patient's care.  Tommy Mathews, 03/06/2018 6:59 PM

## 2018-03-06 NOTE — Progress Notes (Signed)
Per Dr. Lonn Georgiaonforti, pt may stay on aerosol trach collar at night.

## 2018-03-06 NOTE — Progress Notes (Signed)
Name: Zorita PangJames Bouler MRN: [id number] DOB: 07-25-1961     CONSULTATION DATE: 01/06/2018 Subjective & objective: Patient has done well on trach collar trials throughout the day  PAST MEDICAL HISTORY :   has a past medical history of Diabetes (HCC), GERD (gastroesophageal reflux disease), HLD (hyperlipidemia), and HTN (hypertension).  has a past surgical history that includes PEG placement (N/A, 01/21/2018) and Tracheostomy tube placement (N/A, 01/23/2018).   Prior to Admission medications   Medication Sig Start Date End Date Taking? Authorizing Provider  atorvastatin (LIPITOR) 80 MG tablet Take 80 mg by mouth at bedtime.   Yes [provider]  divalproex (DEPAKOTE ER) 500 MG 24 hr tablet Take 500 mg by mouth 2 (two) times daily.   Yes [provider]  estradiol (ESTRACE) 2 MG tablet Take 2 mg by mouth daily.   Yes [provider]  FLUoxetine (PROZAC) 40 MG capsule Take 40 mg by mouth daily.   Yes [provider]  folic acid (FOLVITE) 800 MCG tablet Take 800 mcg by mouth daily.   Yes [provider]  hydrochlorothiazide (HYDRODIURIL) 50 MG tablet Take 50 mg by mouth daily.   Yes [provider]  lisinopril (PRINIVIL,ZESTRIL) 5 MG tablet Take 5 mg by mouth daily.   Yes [provider]  metFORMIN (GLUCOPHAGE) 1000 MG tablet Take 1,000 mg by mouth 2 (two) times daily with a meal.   Yes [provider]  methotrexate (RHEUMATREX) 2.5 MG tablet Take 15 mg by mouth once a week.   Yes [provider]  metoprolol tartrate (LOPRESSOR) 50 MG tablet Take 50 mg by mouth 2 (two) times daily.   Yes [provider]  Omega-3 Fatty Acids (FISH OIL) 1000 MG CAPS Take 1,000 mg by mouth daily.   Yes [provider]  QUEtiapine Fumarate (SEROQUEL PO) Take 500 mg by mouth 2 (two) times daily.   Yes [provider]  ranitidine (ZANTAC) 150 MG tablet Take 150 mg by mouth daily.   Yes [provider]    rivaroxaban (XARELTO) 10 MG TABS tablet Take 10 mg by mouth daily.   Yes [provider]  senna-docusate (SENOKOT-S) 8.6-50 MG tablet Take 2 tablets by mouth at bedtime.   Yes [provider]  sodium chloride 1 g tablet Take 1 g by mouth daily.   Yes [provider]   No Known Allergies  FAMILY HISTORY:  family history is not on file. SOCIAL HISTORY:  reports that he has been smoking cigarettes.  He started smoking about 19 years ago. He has a 40.00 pack-year smoking history. He has never used smokeless tobacco. He reports that he does not drink alcohol or use drugs.  REVIEW OF SYSTEMS:   Unable to obtain due to critical illness   VITAL SIGNS: Temp:  [98.2 F (36.8 C)-99 F (37.2 C)] 98.2 F (36.8 C) (06/06 0400) Pulse Rate:  [72-93] 91 (06/06 0800) Resp:  [12-42] 27 (06/06 0800) BP: (103-157)/(44-89) 135/89 (06/06 0800) SpO2:  [90 %-100 %] 98 % (06/06 0800) FiO2 (%):  [40 %-60 %] 40 % (06/06 0431) Weight:  [218 lb 14.7 oz (99.3 kg)] 218 lb 14.7 oz (99.3 kg) (06/06 0500)  Physical Examination:  Awakeandfollowingcommands. Vital signs: Please see the above listed vital signs HEENT: Tracheostomy in place Cardiovascular: Regular rate and rhythm Pulmonary: Clear to auscultation Abdominal: Soft exam, PEG tube noted Extremities: Edema appreciated Neurologic: Patient is much more responsive and interactive and moves all extremities  ASSESSMENT / PLAN:  Acute on chronic respiratory failure s/p tracheostomy. Will continue trach collar trials throughout the day and mechanical ventilation at night  Altered mental status(improved). 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 OptimizeValproic acid,Klonapin, Seroquel. Plan to avoid using iv meds as requested by accepting facility for LTAC.  Atelectasis and pneumonia.improvedBil. Airspacedisease pulmonary congestion.on Ancef. Monitor CXR + CBC + FIO2 and cultures. Diuresis to improve lung  compliance.  Sepsis withEnterobacteriaceae,KlebPneumbacteremia.BLD Cult 5/27 remains + ve sensitive to Ancef , Urine Cult 5/27 -ve and Sput 5/17 -ve on Ancef, Follow with ID   PAF. HFrEF ECHO 12/2017 LV EF 20-25%,Currently in NSR,Diuresis and ACE inhibitors.  GI bleeding. -ve endoscopy -PPI  DM. Glycemic control.  Sacral decub. Wound care   Patient ID: Nayib Remer, male   DOB: [birth year], 57 y.o.   MRN: [id number] Patient ID: Ciel Yanes, male   DOB: [birth year], 57 y.o.   MRN: [id number] Patient ID: Kimon Loewen, male   DOB: [birth year], 57 y.o.   MRN: [id number] Patient ID: Kinston Magnan, male   DOB: Dec 16, 1960, 57 y.o.   MRN: [id number]

## 2018-03-06 NOTE — Progress Notes (Signed)
Pharmacy Electrolyte Monitoring Consult:  Pharmacy consulted to assist in monitoring and replacing electrolytes in this 57 y.o. male admitted on 01/06/2018 with Respiratory Distress Patient receiving Magnesium oxide 400 mg PO BID.  Labs:  Sodium (mmol/L)  Date Value  03/05/2018 137   Potassium (mmol/L)  Date Value  03/05/2018 3.9   Magnesium (mg/dL)  Date Value  16/10/960406/11/2017 1.8   Phosphorus (mg/dL)  Date Value  54/09/811906/11/2017 3.8   Calcium (mg/dL)  Date Value  14/78/295606/01/2018 8.6 (L)   Albumin (g/dL)  Date Value  21/30/865706/01/2018 2.4 (L)    Assessment/Plan: No further supplementation warranted.   Will continue to monitor and adjust as needed.  Pharmacy will continue to monitor and adjust per consult.   Cadince Hilscher L, 03/06/2018 6:57 PM

## 2018-03-06 NOTE — Progress Notes (Signed)
[facility] - [place] at [facility]   PATIENT NAME: Tommy Mathews    MR#:  [id number]  DATE OF BIRTH:  08-13-1961  SUBJECTIVE:   OOB to chair , physical therapy  working with the patient  no overnight incidents FiO2 at 35%-45%  with PEEP of 7, pt is communicating on trach  REVIEW OF SYSTEMS:  Unable to obtain Tolerating  tube feeds Glucerna  DRUG ALLERGIES:  No Known Allergies  VITALS:  Blood pressure 135/89, pulse 91, temperature 98.9 F (37.2 C), temperature source Oral, resp. rate (!) 27, height 5\' 10"  (1.778 m), weight 99.3 kg (218 lb 14.7 oz), SpO2 98 %.  PHYSICAL EXAMINATION:  Constitutional: Critically ill-appearing. No distress. HENT: Normocephalic..  Tracheostomy site is intact Eyes: Conjunctivae are normal. , no scleral icterus.  Neck:  Neck supple. No JVD. No tracheal deviation. CVS: RRR, S1/S2 +, no murmurs, no gallops, no carotid bruit.  Pulmonary: CTAB no wheezing today abdominal: Soft. BS +,  no distension, tenderness, rebound or guarding.  Musculoskeletal:1+ LEE/hands with mild edema  Neuro: Moves extremities  skin: Stage II decubitus ulcer buttock PSYCH seems to be at baseline   LABORATORY PANEL:   CBC Recent Labs  Lab 03/05/18 0902  WBC 12.1*  HGB 9.6*  HCT 30.3*  PLT 371   ------------------------------------------------------------------------------------------------------------------  Chemistries  Recent Labs  Lab 03/03/18 0541 03/05/18 0902  NA 135 137  K 4.1 3.9  CL 100* 101  CO2 29 29  GLUCOSE 120* 126*  BUN 21* 22*  CREATININE 0.53* 0.66  CALCIUM 8.2* 8.6*  MG 1.8  --   AST  --  18  ALT  --  12*  ALKPHOS  --  61  BILITOT  --  0.3   ------------------------------------------------------------------------------------------------------------------  Cardiac Enzymes No results for input(s): TROPONINI in the last 168  hours. ------------------------------------------------------------------------------------------------------------------  RADIOLOGY:  No results found. ASSESSMENT AND PLAN:   57 year old male with history of TBI admitted for severe respiratory failure and severe shock with pneumonia.  1.  Severe hypoxic and hypercapnic respiratory failure/ARDS: Patient remains on vent support via tracheostomy Management as per intensivist  2.  Sepsis secondary to ventilator associated pneumonia with  recurrent Klebsiella: Patient had completed antibiotics however she had a fever on May 27 and cultures are positive for Klebsiella -recurrent; patient is started on cefazolin we will continue 2 g IV every 8 hours as recommended by ID    3.  PAF: Patient is currently in sinus rhythm Continue digoxin and metoprolol Amiodarone discontinued due to worsening of respiratory status  4.  Acute on chronic systolic heart failure: Currently euvolemic  continue Lasix as needed, lisinopril and metoprolol  5.  [name] evaluated by psychiatry Continue valproic acid  He needs to be on Seroquel as this helps with his mood  6.  Recent GI bleed on PPI with negative endoscopy Monitor hemoglobin ,at 9.6 Continue PPI  7.  Acute kidney injury in the setting of sepsis which has resolved  8.  Diabetes: Continue Lantus with sliding scale  9.  Stage II decubitus buttock ulcer: Continue local wound care  10.  Nutrition: On tube feeds-Glucerna  Disposition:our hospital medicaid liason was able to look patient up and see that patient's medicaid check was still going to the [facility] in [place]. CSW has notified, his Medicaid check need to be transferred from the rehab center to the new facility in [place] where he will go eventually.  Social workers are  working   [facility]  Clinical social work assisting with placement  CODE STATUS: DNR  TOTAL TIME TAKING  CARE OF THIS PATIENT: 21 minutes.     Ramonita LabAruna Addasyn Mcbreen M.D on 03/06/2018 at 3:45 PM  Between 7am to 6pm - Pager - [phone] After 6pm go to [email or web address] - password [email or web address]  [facility] [place] [facility]  Office  [phone]  CC: Primary care physician; Marcina MillardParaschos, Alexander, MD  Note: This dictation was prepared with Dragon dictation along with smaller phrase technology. Any transcriptional errors that result from this process are unintentional.

## 2018-03-07 LAB — GLUCOSE, CAPILLARY
GLUCOSE-CAPILLARY: 142 mg/dL — AB (ref 65–99)
GLUCOSE-CAPILLARY: 161 mg/dL — AB (ref 65–99)
Glucose-Capillary: 113 mg/dL — ABNORMAL HIGH (ref 65–99)
Glucose-Capillary: 114 mg/dL — ABNORMAL HIGH (ref 65–99)
Glucose-Capillary: 117 mg/dL — ABNORMAL HIGH (ref 65–99)
Glucose-Capillary: 169 mg/dL — ABNORMAL HIGH (ref 65–99)

## 2018-03-07 NOTE — Progress Notes (Signed)
Patient interacting and mouthing words this shift, following commands and attempting to make conversation. Patient denied any pain. Worked with Physical Therapy, who dressed patient and got patient out of bed to chair. Patient sat up for approximately an hour, then requested to return to bed. All linens changed and patient returned to bed and positioned for comfort. Tube feeding infusing, patient resting and observed watching television in bed.

## 2018-03-07 NOTE — Progress Notes (Signed)
Patient has been awake on and off this shift. Has required frequent suctioning via trach for copious thick white sputum. Had a large BM. Able to follow commands and makes his needs understood. Continue on Glucerna tube feeding via peg tube. Foley patient and draining amber colored urine. No indications of pain or discomfort but acknowledged he was cold and wanted blankets returned to his bed.

## 2018-03-07 NOTE — Clinical Social Work Note (Signed)
As of today, [facility] with [facility] are now able to take patient. An unexpected hurdle arose however and now we are unable to find transportation for patient. There usually are no issues with transportation but because some of the EMS companies contract with medicaid they are restricted to certain distances. Other companies could not tie up their staff to go the distance of the facility. [facility] no longer will transport vent/snf patients. CSW working with Chief Executive OfficerCSW Director to continue to attempt to find an EMS company that will transport vent/snf. York SpanielMonica Yittel Emrich MSW,LCSW [phone]

## 2018-03-07 NOTE — Progress Notes (Signed)
Name: Tommy Mathews MRN: [id number] DOB: Jul 19, 1961     CONSULTATION DATE: 01/06/2018 Subjective & objective: Patient has done well on trach collar trials throughout the day  PAST MEDICAL HISTORY :   has a past medical history of Diabetes (HCC), GERD (gastroesophageal reflux disease), HLD (hyperlipidemia), and HTN (hypertension).  has a past surgical history that includes PEG placement (N/A, 01/21/2018) and Tracheostomy tube placement (N/A, 01/23/2018).   Prior to Admission medications   Medication Sig Start Date End Date Taking? Authorizing Provider  atorvastatin (LIPITOR) 80 MG tablet Take 80 mg by mouth at bedtime.   Yes [provider]  divalproex (DEPAKOTE ER) 500 MG 24 hr tablet Take 500 mg by mouth 2 (two) times daily.   Yes [provider]  estradiol (ESTRACE) 2 MG tablet Take 2 mg by mouth daily.   Yes [provider]  FLUoxetine (PROZAC) 40 MG capsule Take 40 mg by mouth daily.   Yes [provider]  folic acid (FOLVITE) 800 MCG tablet Take 800 mcg by mouth daily.   Yes [provider]  hydrochlorothiazide (HYDRODIURIL) 50 MG tablet Take 50 mg by mouth daily.   Yes [provider]  lisinopril (PRINIVIL,ZESTRIL) 5 MG tablet Take 5 mg by mouth daily.   Yes [provider]  metFORMIN (GLUCOPHAGE) 1000 MG tablet Take 1,000 mg by mouth 2 (two) times daily with a meal.   Yes [provider]  methotrexate (RHEUMATREX) 2.5 MG tablet Take 15 mg by mouth once a week.   Yes [provider]  metoprolol tartrate (LOPRESSOR) 50 MG tablet Take 50 mg by mouth 2 (two) times daily.   Yes [provider]  Omega-3 Fatty Acids (FISH OIL) 1000 MG CAPS Take 1,000 mg by mouth daily.   Yes [provider]  QUEtiapine Fumarate (SEROQUEL PO) Take 500 mg by mouth 2 (two) times daily.   Yes [provider]  ranitidine (ZANTAC) 150 MG tablet Take 150 mg by mouth daily.   Yes [provider]    rivaroxaban (XARELTO) 10 MG TABS tablet Take 10 mg by mouth daily.   Yes [provider]  senna-docusate (SENOKOT-S) 8.6-50 MG tablet Take 2 tablets by mouth at bedtime.   Yes [provider]  sodium chloride 1 g tablet Take 1 g by mouth daily.   Yes [provider]   No Known Allergies  FAMILY HISTORY:  family history is not on file. SOCIAL HISTORY:  reports that he has been smoking cigarettes.  He started smoking about 19 years ago. He has a 40.00 pack-year smoking history. He has never used smokeless tobacco. He reports that he does not drink alcohol or use drugs.  VITAL SIGNS: Temp:  [98.2 F (36.8 C)-99.3 F (37.4 C)] 99.3 F (37.4 C) (06/07 0400) Pulse Rate:  [74-91] 88 (06/07 0700) Resp:  [5-32] 28 (06/07 0700) BP: (92-142)/(64-104) 142/104 (06/07 0700) SpO2:  [87 %-98 %] 97 % (06/07 0700) FiO2 (%):  [35 %-40 %] 35 % (06/07 0500) Weight:  [97.2 kg (214 lb 4.6 oz)] 97.2 kg (214 lb 4.6 oz) (06/07 0500)  Physical Examination:  Awakeandfollowingcommands. Vital signs: Please see the above listed vital signs HEENT: Tracheostomy in place Cardiovascular: Regular rate and rhythm Pulmonary: Clear to auscultation Abdominal: Soft exam, PEG tube noted Extremities: Edema appreciated Neurologic: Patient is much more responsive and interactive and moves all extremities  ASSESSMENT / PLAN:  Acute on chronic respiratory failure s/p tracheostomy. Patient remain off the ventilator throughout the  day yesterday. Doing well this morning awake alert and communicating  Altered mental status(improved). 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 OptimizeValproic acid,Klonapin, Seroquel. Plan to avoid using iv meds as requested by accepting facility for LTAC.  Sepsis withEnterobacteriaceae,KlebPneumbacteremia.BLD Cult 5/27 remains + ve sensitive to Ancef , Urine Cult 5/27 -ve and Sput 5/17 -ve on Ancef, Follow with ID   PAF. HFrEF ECHO 12/2017 LV EF  20-25%,Currently in NSR,Diuresis and ACE inhibitors.  GI bleeding. -ve endoscopy -PPI  DM. Glycemic control.  Sacral decub. Wound care   Patient ID: Tommy Mathews, male   DOB: 27-Feb-1961, 57 y.o.   MRN: [id number] Patient ID: Tommy Mathews, male   DOB: 27-Feb-1961, 57 y.o.   MRN: [id number] Patient ID: Tommy Mathews, male   DOB: 27-Feb-1961, 57 y.o.   MRN: [id number] Patient ID: Tommy Mathews, male   DOB: 27-Feb-1961, 57 y.o.   MRN: [id number]

## 2018-03-07 NOTE — Progress Notes (Signed)
Physical Therapy Treatment Patient Details Name: Tommy Mathews MRN: [id number] DOB: [birth year] Today's Date: 03/07/2018    History of Present Illness 57 yo male presented to ER (4/8) secondary to respiratory distress (requiring intubation in ED); admitted with severe sepsis, shock due to multifocal PNA (possible aspiration event), UTI.  Mod amount of coffee-ground material suctioned through OG tube initially; suspected with GIB.  Hospital course further significant for difficulty weaning and persistent ventilator dyssynchrony, requiring administration of vecuronium; afib; PEG placement (4/23), trach placement (4/25); intermittent afib with RVR (controlled with medication as needed).  Required extended time for vent weaning; now tolerating trach collar this date.    PT Comments    Continued improvement in alertness, interaction with therapist and effort with session. Much more engaged and dynamic, clearly communicating wants/needs throughout session.  Able to initiate OOB to chair via SPT with RW, max assist +2 this date.  Able to indep advance LEs with appropriate weight shift (facilitated from therapist); much more active effort to initiate postural extension with all standing activities. Will plan to address sitting balance on more firm surface next session (chair vs therapy table?) and progress mobility as appropriate. Vitals stable and WFL on aerosolized trach collar throughout session.  Recommend use of total lift for return to bed; sling left in room, nursing instructed in use.   Follow Up Recommendations  (vent SNF)     Equipment Recommendations       Recommendations for Other Services       Precautions / Restrictions Precautions Precautions: Fall Precaution Comments: NPO/PEG, trach Restrictions Weight Bearing Restrictions: No    Mobility  Bed Mobility Overal bed mobility: Needs Assistance Bed Mobility: Supine to Sit Rolling: Mod assist   Supine to sit: Mod assist;Max  assist        Transfers Overall transfer level: Needs assistance Equipment used: Rolling walker (2 wheeled) Transfers: Sit to/from Stand Sit to Stand: Mod assist;+2 physical assistance         General transfer comment: elevated bed surface to assist with movement transition; improving trunk control and postural extension  Ambulation/Gait Ambulation/Gait assistance: Max assist;+2 physical assistance Ambulation Distance (Feet): 5 Feet Assistive device: Rolling walker (2 wheeled)       General Gait Details: max assist +2 for standing balance, weight shifting (to unweight/advance each LE).  Very short, shuffling steps; supports body weight in LEs without buckling.   Stairs             Wheelchair Mobility    Modified Rankin (Stroke Patients Only)       Balance Overall balance assessment: Needs assistance Sitting-balance support: No upper extremity supported;Feet supported Sitting balance-Leahy Scale: Fair Sitting balance - Comments: excessive R lateral trunk elongation and downward tilt of R pelvis in unsupported sitting; decreased pushing towards L this date, but moderate sway,  LOB in all planes wiht dynamic activities.   Standing balance support: Bilateral upper extremity supported Standing balance-Leahy Scale: Poor Standing balance comment: mod/max assist +2 for sit/stand and static stance                            Cognition Arousal/Alertness: Awake/alert Behavior During Therapy: WFL for tasks assessed/performed Overall Cognitive Status: Within Functional Limits for tasks assessed                                 General Comments: consistently follows simple commands;  makes needs/wants known; appropriate social interaction      Exercises Other Exercises Other Exercises: rolling bilat mod assist for management of LBD; continues to attempt bridging for pulling pants over hips (completed in sidelying) Other Exercises: Unsupported  sitting, continued efforts with alternate UE reaching (anterior, R anterior/lateral quadrant) to promote core activation, midline orientation and dynamic weight shifting.  R UE propped on pillows to R lateral aspect in WBing to encourage additioanl R lateral weight shift. Other Exercises: Sit/stand x3 with RW, mod assist +2, progressing to include bilat hip/trunk flexion/extension activities.  Actively initiates postural extension, upward gaze when cued by therapist and sustains for brief periods of time Other Exercises: Bed/chair transfer, SPT with RW, max assist +2, for lateral weight shift, standing balance.  Able to advance LEs (with appropriate weight shift from therapist) and support body weight in LEs without buckling.    General Comments        Pertinent Vitals/Pain Pain Assessment: Faces Faces Pain Scale: No hurt    Home Living                      Prior Function            PT Goals (current goals can now be found in the care plan section) Acute Rehab PT Goals PT Goal Formulation: Patient unable to participate in goal setting Time For Goal Achievement: 03/17/18 Potential to Achieve Goals: Fair Progress towards PT goals: Progressing toward goals    Frequency    Min 2X/week      PT Plan Current plan remains appropriate    Co-evaluation              AM-PAC PT "6 Clicks" Daily Activity  Outcome Measure  Difficulty turning over in bed (including adjusting bedclothes, sheets and blankets)?: Unable Difficulty moving from lying on back to sitting on the side of the bed? : Unable Difficulty sitting down on and standing up from a chair with arms (e.g., wheelchair, bedside commode, etc,.)?: Unable Help needed moving to and from a bed to chair (including a wheelchair)?: Total Help needed walking in hospital room?: Total Help needed climbing 3-5 steps with a railing? : Total 6 Click Score: 6    End of Session Equipment Utilized During Treatment: Gait  belt;Oxygen Activity Tolerance: Patient tolerated treatment well Patient left: in chair;with nursing/sitter in room;with call bell/phone within reach Nurse Communication: Mobility status PT Visit Diagnosis: Difficulty in walking, not elsewhere classified (R26.2);Muscle weakness (generalized) (M62.81)     Time: [id number] PT Time Calculation (min) (ACUTE ONLY): 60 min  Charges:  $Therapeutic Exercise: 8-22 mins $Therapeutic Activity: 23-37 mins $Neuromuscular Re-education: 8-22 mins                    G Codes:       Ulys Favia H. Manson PasseyBrown, PT, DPT, NCS 03/07/18, 5:32 PM [phone]

## 2018-03-07 NOTE — Progress Notes (Signed)
Pharmacy Electrolyte Monitoring Consult:  Pharmacy consulted to assist in monitoring and replacing electrolytes in this 57 y.o. male admitted on 01/06/2018 with Respiratory Distress Patient receiving Magnesium oxide 400 mg PO BID.  Labs:  Sodium (mmol/L)  Date Value  03/05/2018 137   Potassium (mmol/L)  Date Value  03/05/2018 3.9   Magnesium (mg/dL)  Date Value  78/46/962906/11/2017 1.8   Phosphorus (mg/dL)  Date Value  52/84/132406/11/2017 3.8   Calcium (mg/dL)  Date Value  40/10/272506/01/2018 8.6 (L)   Albumin (g/dL)  Date Value  36/64/403406/01/2018 2.4 (L)    Assessment/Plan: No further supplementation warranted.   Will continue to monitor and adjust as needed.  Pharmacy will continue to monitor and adjust per consult.   Zoanne Newill L, 03/07/2018 4:34 PM

## 2018-03-07 NOTE — Progress Notes (Signed)
Pharmacy Antibiotic Note  Tommy Mathews is a 57 y.o. male being treated for possible HCAP/ pneumonia. Patient has had extended/complicated stay at hospital. Pharmacy has been consulted for cefazolin. Patient has recurrent Klebsiella bacteremia.   Plan: Continue cefazolin 2 g IV Q8h.  Per ID, planning for 14 course of Abx; last day 6/9.    Height: 5\' 10"  (177.8 cm) Weight: 214 lb 4.6 oz (97.2 kg) IBW/kg (Calculated) : 73  Adjusted Body Weight: 98kg   Temp (24hrs), Avg:99.1 F (37.3 C), Min:98.7 F (37.1 C), Max:99.4 F (37.4 C)  Recent Labs  Lab 03/01/18 0806 03/02/18 0515 03/03/18 0541 03/05/18 0902  WBC  --   --  12.7* 12.1*  CREATININE 0.56* 0.54* 0.53* 0.66    Estimated Creatinine Clearance: 122 mL/min (by C-G formula based on SCr of 0.66 mg/dL).    No Known Allergies  Antimicrobials this admission: Vancomycin 5/3 >> 5/10, 5/27 >> 5/28 Meropenem 5/3 >> 5/10, meropenem 5/28 >> 5/30 Anidulafungin 5/3 >> 5/6 Cefazolin 5/11 >> 5/13, 5/30 >> 6/9 Cefepime 5/27 >> 5/28  Microbiology results: 5/3 BCx: Klebsiella Pneumoniae 5/7 BCx: no growth  5/3, 5/25 UCx: NG  5/3 Tracheal Aspirate: mod GPC, GPR  4/20, 5/8 MRSA PCR: positive 5/16 Trach aspirate: Kleb pneumo 5/25 Trach aspirate: normal resp flora  5/27 Trach aspirate: GPC in pairs/chains, GNR 5/27 BCx: Klebsiella pneumoniae, sensitive to cefazolin  Thank you for allowing pharmacy to be a part of this patient's care.  Simpson,Michael L, 03/07/2018 4:34 PM

## 2018-03-07 NOTE — Progress Notes (Signed)
[facility] - [place] at [facility]   PATIENT NAME: Tommy Mathews    MR#:  [id number]  DATE OF BIRTH:  25-Oct-1960  SUBJECTIVE:   OOB to chair , physical therapy  working with the patient Trach collar trials today REVIEW OF SYSTEMS:  Unable to obtain Tolerating  tube feeds Glucerna  DRUG ALLERGIES:  No Known Allergies  VITALS:  Blood pressure (!) 187/159, pulse (!) 40, temperature 99.2 F (37.3 C), temperature source Oral, resp. rate (!) 22, height 5\' 10"  (1.778 m), weight 97.2 kg (214 lb 4.6 oz), SpO2 93 %.  PHYSICAL EXAMINATION:  Constitutional: Critically ill-appearing. No distress. HENT: Normocephalic..  Tracheostomy site is intact Eyes: Conjunctivae are normal. , no scleral icterus.  Neck:  Neck supple. No JVD. No tracheal deviation. CVS: RRR, S1/S2 +, no murmurs, no gallops, no carotid bruit.  Pulmonary: CTAB no wheezing today abdominal: Soft. BS +,  no distension, tenderness, rebound or guarding.  Musculoskeletal:1+ LEE/hands with mild edema  Neuro: Moves extremities  skin: Stage II decubitus ulcer buttock PSYCH seems to be at baseline   LABORATORY PANEL:   CBC Recent Labs  Lab 03/05/18 0902  WBC 12.1*  HGB 9.6*  HCT 30.3*  PLT 371   ------------------------------------------------------------------------------------------------------------------  Chemistries  Recent Labs  Lab 03/03/18 0541 03/05/18 0902  NA 135 137  K 4.1 3.9  CL 100* 101  CO2 29 29  GLUCOSE 120* 126*  BUN 21* 22*  CREATININE 0.53* 0.66  CALCIUM 8.2* 8.6*  MG 1.8  --   AST  --  18  ALT  --  12*  ALKPHOS  --  61  BILITOT  --  0.3   ------------------------------------------------------------------------------------------------------------------  Cardiac Enzymes No results for input(s): TROPONINI in the last 168 hours. ------------------------------------------------------------------------------------------------------------------  RADIOLOGY:  No  results found. ASSESSMENT AND PLAN:   57 year old male with history of TBI admitted for severe respiratory failure and severe shock with pneumonia.  1.  Severe hypoxic and hypercapnic respiratory failure/ARDS: Patient remains on vent support via tracheostomy Management as per intensivist Trach collar trials  2.  Sepsis secondary to ventilator associated pneumonia with  recurrent Klebsiella: Patient had completed antibiotics however she had a fever on May 27 and cultures are positive for Klebsiella -recurrent; patient is started on cefazolin we will continue 2 g IV every 8 hours as recommended by ID    3.  PAF: Patient is currently in sinus rhythm Continue digoxin and metoprolol Amiodarone discontinued due to worsening of respiratory status  4.  Acute on chronic systolic heart failure: Currently euvolemic  continue Lasix as needed, lisinopril and metoprolol  5.  [name] evaluated by psychiatry Continue valproic acid  He needs to be on Seroquel as this helps with his mood  6.  Recent GI bleed on PPI with negative endoscopy Monitor hemoglobin ,at 9.6 Continue PPI  7.  Acute kidney injury in the setting of sepsis which has resolved  8.  Diabetes: Continue Lantus with sliding scale  9.  Stage II decubitus buttock ulcer: Continue local wound care  10.  Nutrition: On tube feeds-Glucerna  Disposition:our hospital medicaid liason was able to look patient up and see that patient's medicaid [facility] [place]. CSW has notified, his Medicaid check need to be transferred from the rehab center to the new facility in [place] where he will go eventually.  Social workers are working   [facility]  Clinical social work  assisting with placement  CODE STATUS: DNR  TOTAL TIME TAKING CARE OF THIS PATIENT: 21 minutes.     Ramonita Lab M.D on 03/07/2018 at 2:29 PM  Between 7am to 6pm - Pager -  [phone] After 6pm go to [email or web address] - password [email or web address]  [facility] [place] [facility]  Office  [phone]  CC: Primary care physician; Marcina Millard, MD  Note: This dictation was prepared with Dragon dictation along with smaller phrase technology. Any transcriptional errors that result from this process are unintentional.

## 2018-03-08 LAB — MRSA PCR SCREENING: MRSA BY PCR: NEGATIVE

## 2018-03-08 LAB — GLUCOSE, CAPILLARY
GLUCOSE-CAPILLARY: 107 mg/dL — AB (ref 65–99)
GLUCOSE-CAPILLARY: 144 mg/dL — AB (ref 65–99)
GLUCOSE-CAPILLARY: 156 mg/dL — AB (ref 65–99)
GLUCOSE-CAPILLARY: 138 mg/dL — AB (ref 65–99)
Glucose-Capillary: 142 mg/dL — ABNORMAL HIGH (ref 65–99)

## 2018-03-08 MED ORDER — CLONAZEPAM 0.5 MG PO TABS
0.2500 mg | ORAL_TABLET | Freq: Two times a day (BID) | ORAL | Status: DC
  Administered 2018-03-08 – 2018-03-19 (×22): 0.25 mg via ORAL
  Filled 2018-03-08 (×22): qty 1

## 2018-03-08 NOTE — Progress Notes (Signed)
Suctioned lg amt of thick tan secretions via trach.

## 2018-03-08 NOTE — Progress Notes (Signed)
Physical Therapy Re-Assessment Patient Details Name: Tommy Mathews MRN: [id number] DOB: June 25, 1961 Today's Date: 03/08/2018    History of Present Illness 57 yo male presented to ER (4/8) secondary to respiratory distress (requiring intubation in ED); admitted with severe sepsis, shock due to multifocal PNA (possible aspiration event), UTI.  Mod amount of coffee-ground material suctioned through OG tube initially; suspected with GIB.  Hospital course further significant for difficulty weaning and persistent ventilator dyssynchrony, requiring administration of vecuronium; afib; PEG placement (4/23), trach placement (4/25); intermittent afib with RVR (controlled with medication as needed).  Required extended time for vent weaning; now tolerating trach collar this date.    PT Comments    Re-assessment completed with pt making progress with mobility since initial evaluation.  The pt demonstrates increased fatigue and lethargy this session compared to the last (RN suspects possibly due to high dose of Seroquel). Pt demonstrates strength grossly 2+/5 during session today but suspect this was impacted by lethargy. Pt put forth great effort and was able to sit EOB for up to 3 seconds with min guard assist and at least 1UE support but did require min>mod assist for remainder of time.  Unable to perform sit>stand this session due to fatigue and ultimately assisted back to supine.  SpO2 94% on trach during seated activity EOB.  Recommended to RN to place order for air mattress as pt with sacral ulcer. SNF remains most appropriate d/c plan at this time.     Follow Up Recommendations  Other (comment)(vent SNF)     Equipment Recommendations  Other (comment)(TBD at next facility)    Recommendations for Other Services       Precautions / Restrictions Precautions Precautions: Fall;Other (comment) Precaution Comments: NPO/PEG, trach Restrictions Weight Bearing Restrictions: No    Mobility  Bed  Mobility Overal bed mobility: Needs Assistance Bed Mobility: Supine to Sit;Sit to Supine Rolling: Max assist;+2 for physical assistance   Supine to sit: Max assist;+2 for physical assistance Sit to supine: Max assist;+2 for physical assistance;HOB elevated   General bed mobility comments: Pt require max A +2 for all bed mobility with VC's for hand placement to use railing  Transfers Overall transfer level: Needs assistance Equipment used: Rolling walker (2 wheeled) Transfers: Sit to/from Stand Sit to Stand: Total assist;From elevated surface         General transfer comment: Sit <>stand attempted x2 with total A and no lift off from bed  Ambulation/Gait             General Gait Details: Unable to assess   Stairs             Wheelchair Mobility    Modified Rankin (Stroke Patients Only)       Balance Overall balance assessment: Needs assistance Sitting-balance support: Single extremity supported;Feet supported Sitting balance-Leahy Scale: Poor Sitting balance - Comments: Pt requires at least 1UE support when sitting EOB and is able to sit with min guard assist for up to 3 seconds but otherwise requires min>mod assist for sitting EOB activity.   Postural control: Posterior lean                                  Cognition Arousal/Alertness: Lethargic(RN reports high dosage of seroquel, could contribute to drowsiness) Behavior During Therapy: WFL for tasks assessed/performed;Flat affect Overall Cognitive Status: Within Functional Limits for tasks assessed  General Comments: consistently following one-two step commands, makes needs/ wants known to therapist      Exercises Other Exercises Other Exercises: Sitting EOB with 1UE support and intermittent min>mod assist for postural stability, pt reaching outside of BOS to promote core activation and anterior lean.  x4 Other Exercises: Pt sat EOB and  washed face (see OT note for details) Other Exercises: Pt performed LB dressing activity in supine (see OT note for details)    General Comments General comments (skin integrity, edema, etc.): VSS throughout session.  Pt did demonstrate moderate amount of secretion at start of session and RN was called to suction with improvement.   Requested air mattress from RN for pt as he has a sacral ulcer.  In supine the pt was unable to maintain hips in neutral when in hooklying due to poor hip IR and adduction strength.  Functionally the pt presented with strength grossly 2+/5 BLEs.       Pertinent Vitals/Pain Pain Assessment: Faces Pain Score: 0-No pain Faces Pain Scale: No hurt Pain Location: Pt shook head no when asked about pain, no grimacing noted Pain Intervention(s): Monitored during session    Home Living                      Prior Function            PT Goals (current goals can now be found in the care plan section) Acute Rehab PT Goals PT Goal Formulation: Patient unable to participate in goal setting Time For Goal Achievement: 03/22/18 Potential to Achieve Goals: Fair Progress towards PT goals: Progressing toward goals    Frequency    Min 2X/week      PT Plan Current plan remains appropriate    Co-evaluation PT/OT/SLP Co-Evaluation/Treatment: Yes Reason for Co-Treatment: Complexity of the patient's impairments (multi-system involvement);For patient/therapist safety;To address functional/ADL transfers PT goals addressed during session: Mobility/safety with mobility;Balance;Strengthening/ROM OT goals addressed during session: ADL's and self-care      AM-PAC PT "6 Clicks" Daily Activity  Outcome Measure  Difficulty turning over in bed (including adjusting bedclothes, sheets and blankets)?: Unable Difficulty moving from lying on back to sitting on the side of the bed? : Unable Difficulty sitting down on and standing up from a chair with arms (e.g., wheelchair,  bedside commode, etc,.)?: Unable Help needed moving to and from a bed to chair (including a wheelchair)?: Total Help needed walking in hospital room?: Total Help needed climbing 3-5 steps with a railing? : Total 6 Click Score: 6    End of Session Equipment Utilized During Treatment: Gait belt;Oxygen Activity Tolerance: Patient limited by fatigue Patient left: in bed;with call bell/phone within reach;with bed alarm set;Other (comment)(Respiratory therapy in room; pillow under LEs to float heels) Nurse Communication: Mobility status PT Visit Diagnosis: Difficulty in walking, not elsewhere classified (R26.2);Muscle weakness (generalized) (M62.81) Pain - Right/Left: Left Pain - part of body: Hip     Time: 1510-1555 PT Time Calculation (min) (ACUTE ONLY): 45 min  Charges:                       G CodesEncarnacion Chu:       Ashley Abashian PT, DPT 03/08/2018, 4:20 PM

## 2018-03-08 NOTE — Progress Notes (Signed)
OT Cancellation Note  Patient Details Name: Tommy Mathews MRN: [id number] DOB: 1961-03-25   Cancelled Treatment:     Attempted to see pt this date, with nursing receiving meds to prepare t/f to floor. Increased time needed to give meds, will return to pt when available and appropriate.  Dalphine HandingKaylee Allee Busk, MSOT, OTR/L   [place]lee Albert Devaul 03/08/2018, 11:40 AM

## 2018-03-08 NOTE — Progress Notes (Signed)
Pharmacy Electrolyte Monitoring Consult:  Pharmacy consulted to assist in monitoring and replacing electrolytes in this 57 y.o. male admitted on 01/06/2018 with Respiratory Distress Patient receiving Magnesium oxide 400 mg PO BID.  Labs:  Sodium (mmol/L)  Date Value  03/05/2018 137   Potassium (mmol/L)  Date Value  03/05/2018 3.9   Magnesium (mg/dL)  Date Value  81/19/147806/11/2017 1.8   Phosphorus (mg/dL)  Date Value  29/56/213006/11/2017 3.8   Calcium (mg/dL)  Date Value  86/57/846906/01/2018 8.6 (L)   Albumin (g/dL)  Date Value  62/95/284106/01/2018 2.4 (L)    Assessment/Plan: Will continue to monitor and adjust as needed.  Pharmacy will continue to monitor and adjust per consult.   Simpson,Michael L, 03/08/2018 7:50 AM

## 2018-03-08 NOTE — Consult Note (Signed)
[facility] Psychiatry Consult   Reason for Consult:  Hx of schizophrenia Referring Physician:  hospital Patient Identification: Tommy Mathews MRN:  [id number] Principal Diagnosis: Schizophrenia [facility]) Diagnosis:   Patient Active Problem List   Diagnosis Date Noted  . Severe recurrent major depression without psychotic features ([place]) [F33.2] 03/03/2018  . Bipolar I disorder, most recent episode depressed ([place]) [F31.30] 03/03/2018  . Schizophrenia ([place]) [F20.9] 02/27/2018  . Pressure injury of skin [L89.90] 01/18/2018  . GI bleed [K92.2] 01/06/2018  . Acute respiratory failure with hypoxia ([place]) [J96.01] 01/06/2018  . Severe sepsis with septic shock ([place]) [A41.9, R65.21] 01/06/2018  . Multifocal pneumonia [J18.9] 01/06/2018  . UTI (urinary tract infection) [N39.0] 01/06/2018  . AKI (acute kidney injury) ([place]) [N17.9] 01/06/2018  . Ileus ([place]) [K56.7] 01/06/2018  . Diabetes ([place]) [E11.9] 01/06/2018    Total Time spent with patient: 20 minutes  Subjective:   Tommy Mathews is a 57 y.o. male patient with schizophrenia, admitted for respiratory failure. .  F/u consulted.  Psychiatry: Patient seen chart reviewed.  Patient remains on trach collar and tube feeding. Tommy Mathews is stable enough to be transferred out of the unit.  Psych is called to check on the patient.  Tommy Mathews is awake, alert and can nod to answer "yes" or "no" questions, and is calm and cooperative. RN stated that Tommy Mathews is able to follow simple commends, and agitation is minimal, but seems to be sedated most of the day.     Past Psychiatric History:  Hx of schizophrenia.  Risk to Self: Is patient at risk for suicide?: No Risk to Others:   Prior Inpatient Therapy:   Prior Outpatient Therapy:    Past Medical History:  Past Medical History:  Diagnosis Date  . Diabetes ([place])   . GERD (gastroesophageal reflux disease)   . HLD (hyperlipidemia)   . HTN (hypertension)     Past Surgical History:  Procedure Laterality Date  .  PEG PLACEMENT N/A 01/21/2018   Procedure: PERCUTANEOUS ENDOSCOPIC GASTROSTOMY (PEG) PLACEMENT;  Surgeon: [name], MD;  Location: [facility] ENDOSCOPY;  Service: Gastroenterology;  Laterality: N/A;  . TRACHEOSTOMY TUBE PLACEMENT N/A 01/23/2018   Procedure: TRACHEOSTOMY;  Surgeon: Carloyn Manner, MD;  Location: [facility] ORS;  Service: ENT;  Laterality: N/A;   Family History: History reviewed. No pertinent family history. Family Psychiatric  History:  unknown Social History:  Social History   Substance and Sexual Activity  Alcohol Use Never  . Frequency: Never     Social History   Substance and Sexual Activity  Drug Use Never    Social History   Socioeconomic History  . Marital status: Single    Spouse name: Not on file  . Number of children: Not on file  . Years of education: Not on file  . Highest education level: Not on file  Occupational History  . Not on file  Social Needs  . Financial resource strain: Somewhat hard  . Food insecurity:    Worry: Patient refused    Inability: Patient refused  . Transportation needs:    Medical: Yes    Non-medical: Yes  Tobacco Use  . Smoking status: Current Every Day Smoker    Packs/day: 1.00    Years: 40.00    Pack years: 40.00    Types: Cigarettes    Start date: 2000  . Smokeless tobacco: Never Used  Substance and Sexual Activity  . Alcohol use: Never    Frequency: Never  . Drug use: Never  . Sexual activity: Never  Lifestyle  . Physical activity:    Days per week: 0 days    Minutes per session: 0 min  . Stress: Only a little  Relationships  . Social connections:    Talks on phone: Not on file    Gets together: Not on file    Attends religious service: Not on file    Active member of club or organization: Not on file    Attends meetings of clubs or organizations: Not on file    Relationship status: Not on file  Other Topics Concern  . Not on file  Social History Narrative   Patient's sister states patient needs  assistance with life management (bills, taking care of house,etc)the patient is wheelchair bound but can stand and walk around house short distances. Pt lived in a group home setting prior to hospital. Pt's mother is next of kin. Etta Quill rn   Additional Social History:    Allergies:  No Known Allergies  Labs:  Results for orders placed or performed during the hospital encounter of 01/06/18 (from the past 48 hour(s))  Glucose, capillary     Status: Abnormal   Collection Time: 03/06/18  5:44 PM  Result Value Ref Range   Glucose-Capillary 138 (H) 65 - 99 mg/dL  Glucose, capillary     Status: Abnormal   Collection Time: 03/06/18  8:06 PM  Result Value Ref Range   Glucose-Capillary 154 (H) 65 - 99 mg/dL  Glucose, capillary     Status: Abnormal   Collection Time: 03/06/18 11:35 PM  Result Value Ref Range   Glucose-Capillary 124 (H) 65 - 99 mg/dL  Glucose, capillary     Status: Abnormal   Collection Time: 03/07/18  4:21 AM  Result Value Ref Range   Glucose-Capillary 113 (H) 65 - 99 mg/dL  Glucose, capillary     Status: Abnormal   Collection Time: 03/07/18  7:31 AM  Result Value Ref Range   Glucose-Capillary 142 (H) 65 - 99 mg/dL  Glucose, capillary     Status: Abnormal   Collection Time: 03/07/18 11:41 AM  Result Value Ref Range   Glucose-Capillary 117 (H) 65 - 99 mg/dL  Glucose, capillary     Status: Abnormal   Collection Time: 03/07/18  4:10 PM  Result Value Ref Range   Glucose-Capillary 161 (H) 65 - 99 mg/dL  Glucose, capillary     Status: Abnormal   Collection Time: 03/07/18  8:23 PM  Result Value Ref Range   Glucose-Capillary 114 (H) 65 - 99 mg/dL  Glucose, capillary     Status: Abnormal   Collection Time: 03/07/18 11:36 PM  Result Value Ref Range   Glucose-Capillary 169 (H) 65 - 99 mg/dL  Glucose, capillary     Status: Abnormal   Collection Time: 03/08/18  4:10 AM  Result Value Ref Range   Glucose-Capillary 107 (H) 65 - 99 mg/dL  Glucose, capillary     Status:  Abnormal   Collection Time: 03/08/18  7:16 AM  Result Value Ref Range   Glucose-Capillary 144 (H) 65 - 99 mg/dL  MRSA PCR Screening     Status: None   Collection Time: 03/08/18 10:58 AM  Result Value Ref Range   MRSA by PCR NEGATIVE NEGATIVE    Comment:        The GeneXpert MRSA Assay (FDA approved for NASAL specimens only), is one component of a comprehensive MRSA colonization surveillance program. It is not intended to diagnose MRSA infection nor to guide or monitor treatment for  MRSA infections. Performed at [facility], [address] [place], [place] [address]     Current Facility-Administered Medications  Medication Dose Route Frequency Provider Last Rate Last Dose  . acetaminophen (TYLENOL) suppository 650 mg  650 mg Rectal Q4H PRN Samaan, Maged, MD      . acetaminophen (TYLENOL) tablet 650 mg  650 mg Per Tube Q4H PRN Wilhelmina Mcardle, MD   650 mg at 02/25/18 0336  . bisacodyl (DULCOLAX) suppository 10 mg  10 mg Rectal Daily PRN Wilhelmina Mcardle, MD   10 mg at 01/20/18 1048  . bisacodyl (DULCOLAX) suppository 10 mg  10 mg Rectal Daily PRN Wilhelmina Mcardle, MD   10 mg at 02/26/18 1501  . budesonide (PULMICORT) nebulizer solution 0.5 mg  0.5 mg Nebulization BID Flora Lipps, MD   0.5 mg at 03/08/18 0845  . ceFAZolin (ANCEF) IVPB 2g/100 mL premix  2 g Intravenous Q8H Leonel Ramsay, MD   Stopped at 03/08/18 [id number]  . chlorhexidine gluconate (MEDLINE KIT) (PERIDEX) 0.12 % solution 15 mL  15 mL Mouth Rinse BID Wilhelmina Mcardle, MD   15 mL at 03/08/18 0728  . clonazePAM (KLONOPIN) tablet 0.25 mg  0.25 mg Oral BID PRN Bettey Costa, MD   0.25 mg at 03/03/18 1457  . clonazePAM (KLONOPIN) tablet 0.5 mg  0.5 mg Oral BID Cassandria Santee, MD   0.5 mg at 03/08/18 1027  . digoxin (LANOXIN) tablet 0.25 mg  0.25 mg Per Tube Daily Paraschos, Alexander, MD   0.25 mg at 03/08/18 1028  . docusate (COLACE) 50 MG/5ML liquid 100 mg  100 mg Per Tube BID PRN Wilhelmina Mcardle, MD      .  docusate (COLACE) 50 MG/5ML liquid 50 mg  50 mg Per Tube BID Cassandria Santee, MD   50 mg at 03/08/18 1027  . enoxaparin (LOVENOX) injection 40 mg  40 mg Subcutaneous Q24H Samaan, Maged, MD   40 mg at 03/08/18 1028  . feeding supplement (GLUCERNA 1.5 CAL) liquid 1,000 mL  1,000 mL Per Tube Continuous Conforti, John, DO 70 mL/hr at 03/08/18 1040 1,000 mL at 03/08/18 1040  . FLUoxetine (PROZAC) 20 MG/5ML solution 20 mg  20 mg Oral Daily Samaan, Maged, MD   20 mg at 03/08/18 1028  . free water 150 mL  150 mL Per Tube Q8H Lafayette Dragon, MD   150 mL at 03/08/18 0525  . furosemide (LASIX) injection 20 mg  20 mg Intravenous Daily Cassandria Santee, MD   20 mg at 03/08/18 1028  . gabapentin (NEURONTIN) 250 MG/5ML solution 200 mg  200 mg Per Tube Q12H Lafayette Dragon, MD   200 mg at 03/08/18 1028  . hydrALAZINE (APRESOLINE) injection 10-40 mg  10-40 mg Intravenous Q4H PRN Lafayette Dragon, MD      . insulin aspart (novoLOG) injection 0-20 Units  0-20 Units Subcutaneous Q4H Wilhelmina Mcardle, MD   3 Units at 03/08/18 0720  . insulin glargine (LANTUS) injection 20 Units  20 Units Subcutaneous QHS Conforti, John, DO   20 Units at 03/07/18 2139  . ipratropium-albuterol (DUONEB) 0.5-2.5 (3) MG/3ML nebulizer solution 3 mL  3 mL Nebulization Q4H Flora Lipps, MD   3 mL at 03/08/18 1114  . ketoconazole (NIZORAL) 2 % cream   Topical Daily PRN Awilda Bill, NP      . lisinopril (PRINIVIL,ZESTRIL) tablet 10 mg  10 mg Per Tube Daily Lafayette Dragon, MD   10 mg at 03/08/18  1027  . magnesium oxide (MAG-OX) tablet 400 mg  400 mg Per Tube BID Flora Lipps, MD   400 mg at 03/08/18 1027  . MEDLINE mouth rinse  15 mL Mouth Rinse 10 times per day Wilhelmina Mcardle, MD   15 mL at 03/08/18 1235  . metoprolol tartrate (LOPRESSOR) tablet 12.5 mg  12.5 mg Per Tube Q6H [place] Dragon, MD   12.5 mg at 03/08/18 9924  . nutrition supplement (JUVEN) (JUVEN) powder packet 1 packet  1 packet Per Tube BID Flora Lipps, MD   1 packet  at 03/08/18 1029  . nystatin (MYCOSTATIN) 100000 UNIT/ML suspension 500,000 Units  5 mL Oral QID Loletha Grayer, MD   500,000 Units at 03/08/18 1027  . pantoprazole sodium (PROTONIX) 40 mg/20 mL oral suspension 40 mg  40 mg Per Tube Daily Wilhelmina Mcardle, MD   40 mg at 03/08/18 1027  . QUEtiapine (SEROQUEL) tablet 200 mg  200 mg Per Tube QHS Fritzi Mandes, MD   200 mg at 03/07/18 2107  . QUEtiapine (SEROQUEL) tablet 500 mg  500 mg Oral Daily Samaan, Maged, MD   500 mg at 03/08/18 1031  . sodium chloride flush (NS) 0.9 % injection 10-40 mL  10-40 mL Intracatheter PRN Wilhelmina Mcardle, MD      . valproic acid (DEPAKENE) solution 500 mg  500 mg Per Tube BID Cassandria Santee, MD   500 mg at 03/08/18 1028    Psychiatric Specialty Exam: Physical Exam  ROS  Blood pressure 116/77, pulse 93, temperature 98.9 F (37.2 C), temperature source Oral, resp. rate 17, height _0  (1.778 m), weight 98.2 kg (216 lb 7.9 oz), SpO2 92 %.Body mass index is 31.06 kg/m.  General Appearance: Casual and Fairly Groomed  Eye Contact:  Good  Speech:  unable to speach due to Trach  Volume:  unable to speak.   Mood:  nodded for feeling "ok"  Affect:  Constricted  Thought Process:  NA  Orientation:  NA  Thought Content:  NA  Suicidal Thoughts:  No  Homicidal Thoughts:  No  Memory:  NA  Judgement:  NA  Insight:  NA  Psychomotor Activity:  NA  Concentration:  Concentration: unable to assess  Recall:  unable to assess  Fund of Knowledge:  Poor  Language:  unable to assess  Akathisia:  No  Handed:  unknown  AIMS (if indicated):     Assets:  Resilience  ADL's:  Impaired  Cognition:  Impaired,  Moderate  Sleep:        Treatment Plan Summary: Daily contact with patient to assess and evaluate symptoms and progress in treatment and Medication management  Disposition: No evidence of imminent risk to self or others at present.   Patient does not meet criteria for psychiatric inpatient admission.    Schizophrenia -- continue seroquel 262m qhs and 5085mqam. Monitor sedation, consider to change to 20021mam and 500m52ms to minimize day time sedation, or consider low total seroquel dose.   Mood disorder -- continue Depakote 500 mg BID. -- continue Prozac 20mg62mly.  -- lower klonopin standing to 0.25mg 56m and consider taper off standing dose of BZD.  -- stopped klonopin PRN.    Khaliah Barnick, MD 03/08/2018 2:35 PM

## 2018-03-08 NOTE — Progress Notes (Signed)
Pt transferring to unit 1C today. Pt notified of transfer. Assessment is unchanged from this morning and receiving RN has been given report. Belongings sent with pt at bedside.

## 2018-03-08 NOTE — Progress Notes (Signed)
[facility] - [place] [facility]   PATIENT NAME: Tommy Mathews    MR#:  [id number]  DATE OF BIRTH:  1961-05-21  SUBJECTIVE:  CHIEF COMPLAINT:   Chief Complaint  Patient presents with  . Respiratory Distress    -Patient has been here for almost 2 months.  Patient is currently on trach collar and also getting tube feeds -Medically stable.  Alert and following commands.  REVIEW OF SYSTEMS:  Review of Systems  Unable to perform ROS: Critical illness    DRUG ALLERGIES:  No Known Allergies  VITALS:  Blood pressure (!) 134/102, pulse 98, temperature 98.4 F (36.9 C), temperature source Oral, resp. rate (!) 36, height 5\' 10"  (1.778 m), weight 98.2 kg (216 lb 7.9 oz), SpO2 93 %.  PHYSICAL EXAMINATION:  Physical Exam  GENERAL:  57 y.o.-year-old patient lying in the bed with no acute distress.  EYES: Pupils equal, round, reactive to light and accommodation. No scleral icterus. Extraocular muscles intact.  HEENT: Head atraumatic, normocephalic. Oropharynx and nasopharynx clear.  NECK:  Supple, no jugular venous distention. No thyroid enlargement, no tenderness.  Tracheostomy in place LUNGS: Normal breath sounds bilaterally, no wheezing, rales,rhonchi or crepitation. No use of accessory muscles of respiration.  Decreased bibasilar breath sounds CARDIOVASCULAR: S1, S2 normal. No murmurs, rubs, or gallops.  ABDOMEN: Soft, nontender, nondistended. Bowel sounds present. No organomegaly or mass.  PEG tube is present EXTREMITIES: No pedal edema, cyanosis, or clubbing.  NEUROLOGIC: No facial droop noted.  Cranial nerves otherwise seem intact.  Following simple commands.  Motor strength is same in all extremities.  Sensation is intact.  PSYCHIATRIC: The patient is alert   SKIN: No obvious rash, lesion, or ulcer.    LABORATORY PANEL:   CBC Recent Labs  Lab 03/05/18 0902  WBC 12.1*  HGB 9.6*  HCT 30.3*  PLT 371    ------------------------------------------------------------------------------------------------------------------  Chemistries  Recent Labs  Lab 03/03/18 0541 03/05/18 0902  NA 135 137  K 4.1 3.9  CL 100* 101  CO2 29 29  GLUCOSE 120* 126*  BUN 21* 22*  CREATININE 0.53* 0.66  CALCIUM 8.2* 8.6*  MG 1.8  --   AST  --  18  ALT  --  12*  ALKPHOS  --  61  BILITOT  --  0.3   ------------------------------------------------------------------------------------------------------------------  Cardiac Enzymes No results for input(s): TROPONINI in the last 168 hours. ------------------------------------------------------------------------------------------------------------------  RADIOLOGY:  No results found.  EKG:   Orders placed or performed during the hospital encounter of 01/06/18  . EKG 12-Lead  . EKG 12-Lead    ASSESSMENT AND PLAN:   57 year old male with past medical history significant for traumatic brain injury, paroxysmal A. fib, systolic heart failure and diabetes presents to hospital secondary to sepsis and respiratory failure.  1.  Severe hypoxic and hypercapnic respiratory failure-patient was intubated and then had a tracheostomy. -Currently on trach collar saturating well. -Appreciate intensivist consult  2.  Sepsis-secondary to VAP Klebsiella pneumonia -Finished antibiotics.  3.  Paroxysmal atrial fibrillation-rate controlled.  Continue digoxin and metoprolol. - not a Candidate for anticoagulation due to recent GI bleed  4.  GI bleed-with recent negative endoscopy.  Hemoglobin is stable at this time.  Continue PPI  5.  History of TBI/depression and schizophrenia-appreciate psych consult.  Continue valproic acid.  Also on Seroquel  6.  Diabetes mellitus-continue Lantus and sliding scale insulin  7.  Stage II decubitus ulcer on buttock-appreciate wound consult.  Continue current management  8.  DVT prophylaxis-on Lovenox  9.  Disposition-social  worker working on placement and getting his Medicaid check to the right rehab center in [place]    All the records are reviewed and case discussed with Care Management/Social Workerr. Management plans discussed with the patient, family and they are in agreement.  CODE STATUS: DNR  TOTAL TIME TAKING CARE OF THIS PATIENT: 32 minutes.   POSSIBLE D/C IN 2 DAYS, DEPENDING ON CLINICAL CONDITION.   Enid BaasKALISETTI,Caelin Rayl M.D on 03/08/2018 at 8:18 AM  Between 7am to 6pm - Pager - [phone]  After 6pm go to [email or web address] - Social research officer, governmentpassword EPAS [facility]  [facility] [place] [facility]  Office  [phone]  CC: Primary care physician; Marcina MillardParaschos, Alexander, MD

## 2018-03-08 NOTE — Progress Notes (Signed)
Name: Zorita PangJames Harten MRN: [id number] DOB: 09/23/1961     CONSULTATION DATE: 01/06/2018 Subjective & objective: Patient has done well, now off the ventilator for 48 hours. Was out of bed to a chair yesterday, no difficulties related throughout the day or night  PAST MEDICAL HISTORY :   has a past medical history of Diabetes (HCC), GERD (gastroesophageal reflux disease), HLD (hyperlipidemia), and HTN (hypertension).  has a past surgical history that includes PEG placement (N/A, 01/21/2018) and Tracheostomy tube placement (N/A, 01/23/2018).   Prior to Admission medications   Medication Sig Start Date End Date Taking? Authorizing Provider  atorvastatin (LIPITOR) 80 MG tablet Take 80 mg by mouth at bedtime.   Yes [provider]  divalproex (DEPAKOTE ER) 500 MG 24 hr tablet Take 500 mg by mouth 2 (two) times daily.   Yes [provider]  estradiol (ESTRACE) 2 MG tablet Take 2 mg by mouth daily.   Yes [provider]  FLUoxetine (PROZAC) 40 MG capsule Take 40 mg by mouth daily.   Yes [provider]  folic acid (FOLVITE) 800 MCG tablet Take 800 mcg by mouth daily.   Yes [provider]  hydrochlorothiazide (HYDRODIURIL) 50 MG tablet Take 50 mg by mouth daily.   Yes [provider]  lisinopril (PRINIVIL,ZESTRIL) 5 MG tablet Take 5 mg by mouth daily.   Yes [provider]  metFORMIN (GLUCOPHAGE) 1000 MG tablet Take 1,000 mg by mouth 2 (two) times daily with a meal.   Yes [provider]  methotrexate (RHEUMATREX) 2.5 MG tablet Take 15 mg by mouth once a week.   Yes [provider]  metoprolol tartrate (LOPRESSOR) 50 MG tablet Take 50 mg by mouth 2 (two) times daily.   Yes [provider]  Omega-3 Fatty Acids (FISH OIL) 1000 MG CAPS Take 1,000 mg by mouth daily.   Yes [provider]  QUEtiapine Fumarate (SEROQUEL PO) Take 500 mg by mouth 2 (two) times daily.   Yes [provider]  ranitidine  (ZANTAC) 150 MG tablet Take 150 mg by mouth daily.   Yes [provider]  rivaroxaban (XARELTO) 10 MG TABS tablet Take 10 mg by mouth daily.   Yes [provider]  senna-docusate (SENOKOT-S) 8.6-50 MG tablet Take 2 tablets by mouth at bedtime.   Yes [provider]  sodium chloride 1 g tablet Take 1 g by mouth daily.   Yes [provider]   No Known Allergies  FAMILY HISTORY:  family history is not on file. SOCIAL HISTORY:  reports that he has been smoking cigarettes.  He started smoking about 19 years ago. He has a 40.00 pack-year smoking history. He has never used smokeless tobacco. He reports that he does not drink alcohol or use drugs.  VITAL SIGNS: Temp:  [98.3 F (36.8 C)-99.2 F (37.3 C)] 98.4 F (36.9 C) (06/08 0745) Pulse Rate:  [40-98] 98 (06/08 0700) Resp:  [19-36] 36 (06/08 0700) BP: (102-187)/(61-159) 134/102 (06/08 0700) SpO2:  [81 %-100 %] 93 % (06/08 0700) FiO2 (%):  [35 %-40 %] 35 % (06/08 0359) Weight:  [98.2 kg (216 lb 7.9 oz)] 98.2 kg (216 lb 7.9 oz) (06/08 0419)  Physical Examination:  Awakeandfollowingcommands. Vital signs: Please see the above listed vital signs HEENT: Tracheostomy in place Cardiovascular: Regular rate and rhythm Pulmonary: Clear to auscultation Abdominal: Soft exam, PEG tube noted Extremities: Edema appreciated Neurologic: Patient is much more responsive and interactive and moves all extremities  ASSESSMENT / PLAN:  Acute on chronic respiratory failure s/p tracheostomy. Patient remain off the ventilator no difficulties throughout the night  Altered mental status(improved). 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  Sepsis withEnterobacteriaceae,KlebPneumbacteremia.BLD Cult 5/27 remains + ve sensitive to Ancef , Urine Cult 5/27 -ve and Sput 5/17 -ve on Ancef  PAF. HFrEF ECHO 12/2017 LV EF 20-25%,Currently in NSR,Diuresis and ACE inhibitors.  GI bleeding. -ve endoscopy PPI  DM.  Glycemic control.  Sacral decub. Wound care  At this point patient has remained stable off mechanical ventilation 48 hours, hemodynamics have been stable, stable for floor transfer   Patient ID: Lorimer Tiberio, male   DOB: [birth year], 57 y.o.   MRN: [id number] Patient ID: Raven Harmes, male   DOB: 01/27/1961, 57 y.o.   MRN: [id number] Patient ID: Naven Giambalvo, male   DOB: July 30, 1961, 57 y.o.   MRN: [id number] Patient ID: Caylen Yardley, male   DOB: 25-Nov-1960, 57 y.o.   MRN: [id number]

## 2018-03-08 NOTE — Progress Notes (Signed)
Occupational Therapy Treatment Patient Details Name: Tommy Mathews MRN: [id number] DOB: 1961/05/31 Today's Date: 03/08/2018    History of present illness 57 yo male presented to ER (4/8) secondary to respiratory distress (requiring intubation in ED); admitted with severe sepsis, shock due to multifocal PNA (possible aspiration event), UTI.  Mod amount of coffee-ground material suctioned through OG tube initially; suspected with GIB.  Hospital course further significant for difficulty weaning and persistent ventilator dyssynchrony, requiring administration of vecuronium; afib; PEG placement (4/23), trach placement (4/25); intermittent afib with RVR (controlled with medication as needed).  Required extended time for vent weaning; now tolerating trach collar this date.   OT comments  Met pt lying in bed, agreeable to OT treatment this date. Pt donned undergarments with max A while bed level with 4-5 bridging attempts to get undergarments over hips. Pt needed OT to thread feet and catheter through undergarments and VC's to complete the entirety of the task. Supine <> sit completed with max A x2 and skilled management of lines and airway. Pt washed face with bathing wipe while seated EOB with set up A, needing mod- max A to maintain sitting balance with 1UE supported. VSS with O2 at 94 (on trach collar) and HR 85. Sit to stand with 2WW attempted x2 with total A, no success of pt even clearing bed. Sit <> supine completed with max A x2. Total A to boost pt up in bed. RN notified intermittently throughout session to suction. RN also notified to order air mattress to support skin integrity (pt with stage II sacral ulcer).   Follow Up Recommendations  SNF    Equipment Recommendations  Other (comment)(TBD)    Recommendations for Other Services      Precautions / Restrictions Precautions Precautions: Fall;Other (comment) Precaution Comments: NPO/PEG, trach Restrictions Weight Bearing Restrictions: No        Mobility Bed Mobility Overal bed mobility: Needs Assistance Bed Mobility: Supine to Sit;Sit to Supine Rolling: Max assist;+2 for physical assistance   Supine to sit: Max assist;+2 for physical assistance Sit to supine: Max assist;+2 for physical assistance;HOB elevated   General bed mobility comments: Pt require max A +2 for all bed mobility with VC's for hand placement to use railing  Transfers Overall transfer level: Needs assistance Equipment used: Rolling walker (2 wheeled) Transfers: Sit to/from Stand Sit to Stand: Total assist;From elevated surface         General transfer comment: Sit <>stand attempted x2 with total A and no lift off from bed    Balance Overall balance assessment: Needs assistance Sitting-balance support: Single extremity supported;Feet supported Sitting balance-Leahy Scale: Poor Sitting balance - Comments: Pt uses at lease 1UE support, able to sit min guard for few seconds, then needed mod A x2 between two therapists to maintain upright posture Postural control: Posterior lean                                 ADL either performed or assessed with clinical judgement   ADL Overall ADL's : Needs assistance/impaired Eating/Feeding: NPO Eating/Feeding Details (indicate cue type and reason): PEG tube Grooming: Sitting;Moderate assistance   Upper Body Bathing: Sitting;Moderate assistance Upper Body Bathing Details (indicate cue type and reason): mod A needed to maintain sitting balance while pt washed face, frequent VC's given for pt to maintain sitting balance, still needed support form PT to maintain Lower Body Bathing: Bed level;Total assistance   Upper Body Dressing :  Sitting;Moderate assistance;Maximal assistance Upper Body Dressing Details (indicate cue type and reason): mod-max assist 2:2 fatigue near end of session and due to all lines/leads/trach for safety Lower Body Dressing: Bed level;Maximal assistance Lower Body  Dressing Details (indicate cue type and reason): pt pulled up undergarments while bridging with cues from PT. Pt needed undergarments threaded through feet and cath, then pulled up while bridging 3-5 times for adequate wear   Toilet Transfer Details (indicate cue type and reason): unsafe/unable to attempt this date, foley in place Toileting- Clothing Manipulation and Hygiene: Total assistance Toileting - Clothing Manipulation Details (indicate cue type and reason): foley in place   Tub/Shower Transfer Details (indicate cue type and reason): unsafe/unable to attempt this date Functional mobility during ADLs: Maximal assistance;+2 for physical assistance;+2 for safety/equipment       Vision Baseline Vision/History: (pt unable to verbalize) Patient Visual Report: (pt unable to verbalize)     Perception     Praxis      Cognition Arousal/Alertness: Lethargic(RN reports high dosage of seroquel, could contribute to drowsiness) Behavior During Therapy: WFL for tasks assessed/performed;Flat affect Overall Cognitive Status: Within Functional Limits for tasks assessed                                 General Comments: consistently following one-two step commands, makes needs/ wants known to therapist        Exercises Exercises: Other exercises Other Exercises Other Exercises: Sitting EOB with 1UE support and intermittent min>mod assist for postural stability, pt reaching outside of BOS to promote core activation and anterior lean.  x4 Other Exercises: Pt sat EOB and washed face (see OT note for details) Other Exercises: Pt performed LB dressing activity in supine (see OT note for details)   Shoulder Instructions       General Comments Pt with saacral ulcer at baseline    Pertinent Vitals/ Pain       Pain Assessment: Faces Pain Score: 0-No pain Faces Pain Scale: No hurt Pain Location: Pt shook head no when asked about pain, no grimacing noted Pain Intervention(s):  Monitored during session  Home Living                                          Prior Functioning/Environment              Frequency  Min 2X/week        Progress Toward Goals  OT Goals(current goals can now be found in the care plan section)  Progress towards OT goals: Progressing toward goals  Acute Rehab OT Goals OT Goal Formulation: Patient unable to participate in goal setting Time For Goal Achievement: 03/18/18 Potential to Achieve Goals: Good  Plan Discharge plan remains appropriate;Frequency remains appropriate    Co-evaluation    PT/OT/SLP Co-Evaluation/Treatment: Yes Reason for Co-Treatment: Complexity of the patient's impairments (multi-system involvement);For patient/therapist safety;To address functional/ADL transfers PT goals addressed during session: Mobility/safety with mobility;Balance;Strengthening/ROM OT goals addressed during session: ADL's and self-care      AM-PAC PT "6 Clicks" Daily Activity     Outcome Measure   Help from another person eating meals?: Total Help from another person taking care of personal grooming?: A Little Help from another person toileting, which includes using toliet, bedpan, or urinal?: Total Help from another person bathing (including washing, rinsing, drying)?:  A Lot Help from another person to put on and taking off regular upper body clothing?: A Lot Help from another person to put on and taking off regular lower body clothing?: A Lot 6 Click Score: 11    End of Session Equipment Utilized During Treatment: Gait belt;Rolling walker;Other (comment)(trach collar)  OT Visit Diagnosis: Other abnormalities of gait and mobility (R26.89);Muscle weakness (generalized) (M62.81);Other symptoms and signs involving cognitive function   Activity Tolerance Patient limited by lethargy;Patient limited by fatigue   Patient Left in bed;with call bell/phone within reach;with bed alarm set   Nurse Communication  Mobility status;Other (comment)(intermittent suction needed)        Time: 1510-1555 OT Time Calculation (min): 45 min  Charges: OT General Charges $OT Visit: 1 Visit OT Treatments $Self Care/Home Management : 23-37 mins  Zenovia Jarred, MSOT, OTR/L   [place] 03/08/2018, 4:26 PM

## 2018-03-09 LAB — GLUCOSE, CAPILLARY
Glucose-Capillary: 128 mg/dL — ABNORMAL HIGH (ref 65–99)
Glucose-Capillary: 92 mg/dL (ref 65–99)
Glucose-Capillary: 144 mg/dL — ABNORMAL HIGH (ref 65–99)
Glucose-Capillary: 156 mg/dL — ABNORMAL HIGH (ref 65–99)

## 2018-03-09 MED ORDER — IPRATROPIUM-ALBUTEROL 0.5-2.5 (3) MG/3ML IN SOLN: 3.0000 mL | RESPIRATORY_TRACT | Status: DC | PRN

## 2018-03-09 MED ORDER — ACETYLCYSTEINE 20 % IN SOLN
4.0000 mL | Freq: Four times a day (QID) | RESPIRATORY_TRACT | Status: DC | PRN
  Administered 2018-03-09 – 2018-03-10 (×2): 4 mL via RESPIRATORY_TRACT
  Filled 2018-03-09 (×4): qty 4

## 2018-03-09 MED ORDER — QUETIAPINE FUMARATE 200 MG PO TABS
400.0000 mg | ORAL_TABLET | Freq: Every day | ORAL | Status: DC
  Administered 2018-03-09 – 2018-03-19 (×11): 400 mg via ORAL
  Filled 2018-03-09 (×11): qty 2

## 2018-03-09 MED ORDER — IPRATROPIUM-ALBUTEROL 0.5-2.5 (3) MG/3ML IN SOLN
3.0000 mL | Freq: Four times a day (QID) | RESPIRATORY_TRACT | Status: DC
  Administered 2018-03-09 – 2018-03-10 (×6): 3 mL via RESPIRATORY_TRACT
  Filled 2018-03-09 (×6): qty 3

## 2018-03-09 NOTE — Progress Notes (Signed)
Suctioned moderate amt thick tan secretions from trach

## 2018-03-09 NOTE — Progress Notes (Addendum)
[facility] -  at [facility]   PATIENT NAME: Tommy Mathews    MR#:  [id number]  DATE OF BIRTH:  10/17/1960  SUBJECTIVE:  CHIEF COMPLAINT:   Chief Complaint  Patient presents with  . Respiratory Distress    - nonverbal, on trach collar - no change. Thick secretions  REVIEW OF SYSTEMS:  Review of Systems  Unable to perform ROS: Patient nonverbal    DRUG ALLERGIES:  No Known Allergies  VITALS:  Blood pressure 132/75, pulse 80, temperature 99.5 F (37.5 C), temperature source Oral, resp. rate 19, height 5\' 10"  (1.778 m), weight 95.8 kg (211 lb 4.8 oz), SpO2 98 %.  PHYSICAL EXAMINATION:  Physical Exam  GENERAL:  57 y.o.-year-old patient lying in the bed with no acute distress.  EYES: Pupils equal, round, reactive to light and accommodation. No scleral icterus. Extraocular muscles intact.  HEENT: Head atraumatic, normocephalic. Oropharynx and nasopharynx clear.  NECK:  Supple, no jugular venous distention. No thyroid enlargement, no tenderness.  Tracheostomy in place LUNGS: Normal breath sounds bilaterally, no wheezing, rales,rhonchi or crepitation. No use of accessory muscles of respiration.  Decreased bibasilar breath sounds CARDIOVASCULAR: S1, S2 normal. No murmurs, rubs, or gallops.  ABDOMEN: Soft, nontender, nondistended. Bowel sounds present. No organomegaly or mass.  PEG tube is present EXTREMITIES: No pedal edema, cyanosis, or clubbing.  NEUROLOGIC: No facial droop noted.  Cranial nerves otherwise seem intact.  Following simple commands.  Motor strength is same in all extremities.  Sensation is intact.  PSYCHIATRIC: The patient is alert   SKIN: No obvious rash, lesion, or ulcer.    LABORATORY PANEL:   CBC Recent Labs  Lab 03/05/18 0902  WBC 12.1*  HGB 9.6*  HCT 30.3*  PLT 371   ------------------------------------------------------------------------------------------------------------------  Chemistries  Recent Labs  Lab  03/03/18 0541 03/05/18 0902  NA 135 137  K 4.1 3.9  CL 100* 101  CO2 29 29  GLUCOSE 120* 126*  BUN 21* 22*  CREATININE 0.53* 0.66  CALCIUM 8.2* 8.6*  MG 1.8  --   AST  --  18  ALT  --  12*  ALKPHOS  --  61  BILITOT  --  0.3   ------------------------------------------------------------------------------------------------------------------  Cardiac Enzymes No results for input(s): TROPONINI in the last 168 hours. ------------------------------------------------------------------------------------------------------------------  RADIOLOGY:  No results found.  EKG:   Orders placed or performed during the hospital encounter of 01/06/18  . EKG 12-Lead  . EKG 12-Lead    ASSESSMENT AND PLAN:   57 year old male with past medical history significant for traumatic brain injury, paroxysmal A. fib, systolic heart failure and diabetes presents to hospital secondary to sepsis and respiratory failure.  1.  Severe hypoxic and hypercapnic respiratory failure-patient was intubated and then had a tracheostomy this admission. -Currently on trach collar saturating well. -prn suctioning, mucinex for thick secretions - need vent SNF  2.  Sepsis-secondary to VAP Klebsiella pneumonia - also positive for bacteremia for klebsiella - on ancef for 14 days, until 03/11/18.  3.  Paroxysmal atrial fibrillation-rate controlled.  Continue digoxin and metoprolol. - not a Candidate for anticoagulation due to recent GI bleed  4.  GI bleed-with recent negative endoscopy.  Hemoglobin is stable at this time.  Continue PPI  5.  History of TBI/depression and schizophrenia-appreciate psych consult.  Continue valproic acid.  Also on Seroquel  6.  Diabetes mellitus-continue Lantus and sliding scale insulin  7.  Stage II decubitus ulcer on buttock-appreciate wound consult.  Continue current management  8.  DVT prophylaxis-on Lovenox  9.  Disposition-social worker working on placement and getting his  Medicaid  to the right rehab center in [place]    All the records are reviewed and case discussed with Care Management/Social Workerr. Management plans discussed with the patient, family and they are in agreement.  CODE STATUS: DNR  TOTAL TIME TAKING CARE OF THIS PATIENT: 28 minutes.   POSSIBLE D/C IN 1-2 DAYS, DEPENDING ON CLINICAL CONDITION.   Enid Baas M.D on 03/09/2018 at 12:08 PM  Between 7am to 6pm - Pager - [phone]  After 6pm go to [email or web address] - Social research officer, government  [facility] [facility]  Office  [phone]  CC: Primary care physician; Marcina Millard, MD

## 2018-03-09 NOTE — Progress Notes (Signed)
Trach suctioned for lg amt of thick tan secretions 

## 2018-03-10 LAB — GLUCOSE, CAPILLARY
GLUCOSE-CAPILLARY: 140 mg/dL — AB (ref 65–99)
GLUCOSE-CAPILLARY: 121 mg/dL — AB (ref 65–99)
GLUCOSE-CAPILLARY: 120 mg/dL — AB (ref 65–99)
Glucose-Capillary: 145 mg/dL — ABNORMAL HIGH (ref 65–99)
Glucose-Capillary: 144 mg/dL — ABNORMAL HIGH (ref 65–99)
Glucose-Capillary: 168 mg/dL — ABNORMAL HIGH (ref 65–99)
Glucose-Capillary: 126 mg/dL — ABNORMAL HIGH (ref 65–99)
Glucose-Capillary: 133 mg/dL — ABNORMAL HIGH (ref 65–99)

## 2018-03-10 MED ORDER — IPRATROPIUM-ALBUTEROL 0.5-2.5 (3) MG/3ML IN SOLN
3.0000 mL | Freq: Three times a day (TID) | RESPIRATORY_TRACT | Status: DC
  Administered 2018-03-10 – 2018-03-12 (×5): 3 mL via RESPIRATORY_TRACT
  Filled 2018-03-10 (×6): qty 3

## 2018-03-10 NOTE — Clinical Social Work Note (Signed)
Patient now off of ventilator and no longer requires a vent/snf facility. Patient now will require a snf that will accommodate trachs. CSW will follow up with this. [place] SpanielMonica Devoiry Corriher MSW,LCSW [phone]

## 2018-03-10 NOTE — Progress Notes (Signed)
Nutrition Follow-up  DOCUMENTATION CODES:   Obesity unspecified  INTERVENTION:  Continue Glucerna 1.5 Cal at 70 mL/hr via G-tube. Provides 2520 kcal, 139 grams of protein, 1277 mL H2O daily.  Continue free water flush of 150 mL Q8hrs. Provides a total of 1727 mL H2O daily including water in tube feeding.  Continue Juven BID per tube, each supplement provides 80 kcal, 14 grams of amino acids, and vitamins/minerals needed for wound healing.  NUTRITION DIAGNOSIS:   Inadequate oral intake related to inability to eat as evidenced by NPO status.  Ongoing - addressing with TF.  GOAL:   Provide needs based on ASPEN/SCCM guidelines  Met with TF.  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   -Patient has now been on tracheostomy collar since the AM of 6/6 and has not needed any nocturnal ventilation. -Patient transferred to 1C on 6/8. -Patient will no longer require a vent/SNF and can go to a SNF that can accept patients with a tracheostomy.  Met with patient in room but he was sleeping with blankets pulled up over his head. Per discussion with RN patient is tolerating tube feeds well still. Edema has improved significantly and he now just has some mild pitting edema.  Enteral Access: 20 Fr G-tube with C-clamp placed 4/23; placement verified by relook endoscopy; 4.5 cm at external bumper  TF: pt is tolerating Glucerna 1.5 Cal at 70 mL/hr  Medications reviewed and include: clonazapam, Colace, free water flush 150 mL Q8hrs,  Lasix 20 mg daily IV, gabapentin, Novolog 0-20 units Q4hrs (received 16 units past 24 hrs), Lantus 20 units QHS, magnesium oxide 400 mg BID per tube, Protonix, Seroquel, valpoic acid.  Labs reviewed: CBG 120-168 past 24 hrs.  I/O: 2000 mL UOP yesterday (0.9 mL/kg/hr)  Weight trend: 94.6 kg on 6/10; -12.8 kg from admission  Discussed with RN.  Diet Order:   Diet Order    None      EDUCATION NEEDS:   Not appropriate for education at this time  Skin:  Skin Assessment: Skin Integrity Issues: Skin Integrity Issues:: Stage II, Other (Comment) Stage II: coccyx (100% of wound base red or granulating) Other: open blister to bilateral thighs; MSAD to groin  Last BM:  03/10/2018 - smear type 6  Height:   Ht Readings from Last 1 Encounters:  01/07/18 '5\' 10"'  (1.778 m)    Weight:   Wt Readings from Last 1 Encounters:  03/10/18 208 lb 8 oz (94.6 kg)    Ideal Body Weight:  75.5 kg  BMI:  Body mass index is 29.92 kg/m.  Estimated Nutritional Needs:   Kcal:  7096-4383 (MSJ x 1.2-1.4); 2485 (25 kcal/kg)  Protein:  113-135 grams (1.5-1.8 grams/kg IBW)  Fluid:  1.8 L/day  Willey Blade, MS, RD, LDN Office: [phone] Pager: [phone] After Hours/Weekend Pager: [phone]

## 2018-03-10 NOTE — Progress Notes (Addendum)
[facility] [place] [facility]   PATIENT NAME: Tommy Mathews    MR#:  [id number]  DATE OF BIRTH:  Jan 17, 1961  SUBJECTIVE:  CHIEF COMPLAINT:   Chief Complaint  Patient presents with  . Respiratory Distress    - nonverbal, on trach collar - no change. Still has secretions  REVIEW OF SYSTEMS:  Review of Systems  Unable to perform ROS: Patient nonverbal    DRUG ALLERGIES:  No Known Allergies  VITALS:  Blood pressure 121/81, pulse 88, temperature 99.1 F (37.3 C), temperature source Oral, resp. rate 20, height 5\' 10"  (1.778 m), weight 94.6 kg (208 lb 8 oz), SpO2 93 %.  PHYSICAL EXAMINATION:  Physical Exam  GENERAL:  57 y.o.-year-old patient lying in the bed with no acute distress.  EYES: Pupils equal, round, reactive to light and accommodation. No scleral icterus. Extraocular muscles intact.  HEENT: Head atraumatic, normocephalic. Oropharynx and nasopharynx clear.  NECK:  Supple, no jugular venous distention. No thyroid enlargement, no tenderness.  Tracheostomy in place LUNGS: Normal breath sounds bilaterally, no wheezing, rales,rhonchi or crepitation. No use of accessory muscles of respiration.  Decreased bibasilar breath sounds CARDIOVASCULAR: S1, S2 normal. No murmurs, rubs, or gallops.  ABDOMEN: Soft, nontender, nondistended. Bowel sounds present. No organomegaly or mass.  PEG tube is present EXTREMITIES: No pedal edema, cyanosis, or clubbing.  NEUROLOGIC: No facial droop noted.  Cranial nerves otherwise seem intact.  Following simple commands.  Motor strength is same in all extremities.  Sensation is intact.  PSYCHIATRIC: The patient is alert   SKIN: No obvious rash, lesion, or ulcer.    LABORATORY PANEL:   CBC Recent Labs  Lab 03/05/18 0902  WBC 12.1*  HGB 9.6*  HCT 30.3*  PLT 371   ------------------------------------------------------------------------------------------------------------------  Chemistries  Recent Labs  Lab  03/05/18 0902  NA 137  K 3.9  CL 101  CO2 29  GLUCOSE 126*  BUN 22*  CREATININE 0.66  CALCIUM 8.6*  AST 18  ALT 12*  ALKPHOS 61  BILITOT 0.3   ------------------------------------------------------------------------------------------------------------------  Cardiac Enzymes No results for input(s): TROPONINI in the last 168 hours. ------------------------------------------------------------------------------------------------------------------  RADIOLOGY:  No results found.  EKG:   Orders placed or performed during the hospital encounter of 01/06/18  . EKG 12-Lead  . EKG 12-Lead    ASSESSMENT AND PLAN:   57 year old male with past medical history significant for traumatic brain injury, paroxysmal A. fib, systolic heart failure and diabetes presents to hospital secondary to sepsis and respiratory failure.  1.  Severe hypoxic and hypercapnic respiratory failure-patient was intubated and then had a tracheostomy this admission. -Currently on trach collar saturating well. -prn suctioning, mucinex for thick secretions - needs SNF that supports trach collar -Social worker f/u  2.  Sepsis-secondary to VAP Klebsiella pneumonia - also positive for bacteremia for klebsiella - on ancef for 14 days, until 03/11/18.  3.  Paroxysmal atrial fibrillation-rate controlled.  Continue digoxin and metoprolol. - not a Candidate for anticoagulation due to recent GI bleed  4.  GI bleed-with recent negative endoscopy.  Hemoglobin is stable at this time.  Continue PPI  5.  History of TBI/depression and schizophrenia-appreciate psych consult.  Continue valproic acid.  Also on Seroquel  6.  Diabetes mellitus-continue Lantus and sliding scale insulin  7.  Stage II decubitus ulcer on buttock-appreciate wound consult.  Continue current management  8.  DVT prophylaxis-on Lovenox  9.  Disposition-social worker working on placement and getting his Medicaid  to the  right rehab center    All  the records are reviewed and case discussed with Care Management/Social Workerr. Management plans discussed with the patient, family and they are in agreement.  CODE STATUS: DNR  TOTAL TIME TAKING CARE OF THIS PATIENT:  25 minutes.   POSSIBLE D/C IN 1-2 DAYS, DEPENDING ON CLINICAL CONDITION AND AVAILABILITY OF BED AT SNF   Vivien Rota Pyreddy M.D on 03/10/2018 at 1:24 PM  Between 7am to 6pm - Pager - [phone]  After 6pm go to [email or web address] - Social research officer, government  [facility]  Office  [phone]  CC: Primary care physician; Marcina Millard, MD

## 2018-03-10 NOTE — Progress Notes (Signed)
OT Cancellation Note  Patient Details Name: Tommy Mathews MRN: [id number] DOB: 1960-11-01   Cancelled Treatment:    Reason Eval/Treat Not Completed: Fatigue/lethargy limiting ability to participate. Spoke with PT who just worked with pt. Pt extremely fatigued after session. Pt sleeping now. Will re-attempt OT tx next date.  Richrd PrimeJamie Stiller, MPH, MS, OTR/L ascom [phone] 03/10/18, 2:58 PM

## 2018-03-10 NOTE — Progress Notes (Signed)
Physical Therapy Treatment Patient Details Name: Tommy Mathews MRN: [id number] DOB: 1960/12/30 Today's Date: 03/10/2018    History of Present Illness 57 yo male presented to ER (4/8) secondary to respiratory distress (requiring intubation in ED); admitted with severe sepsis, shock due to multifocal PNA (possible aspiration event), UTI.  Mod amount of coffee-ground material suctioned through OG tube initially; suspected with GIB.  Hospital course further significant for difficulty weaning and persistent ventilator dyssynchrony, requiring administration of vecuronium; afib; PEG placement (4/23), trach placement (4/25); intermittent afib with RVR (controlled with medication as needed).  Required extended time for vent weaning; now tolerating trach collar this date.    PT Comments    Patient notably fatigued this date, closing eyes/falling asleep multiple times during session.  Opens eyes to cuing, but unable to maintain for meaningful participation with session despite constant change of position and stimulation. Did transition to Vital Go Tilt bed, but tilt deferred to next session due to decreased alertness.  Will plan to attempt session in AM as schedule allows.  RN informed/aware of transition.    Follow Up Recommendations  SNF     Equipment Recommendations       Recommendations for Other Services       Precautions / Restrictions Precautions Precautions: Fall;Other (comment) Precaution Comments: NPO/PEG, trach Restrictions Weight Bearing Restrictions: No    Mobility  Bed Mobility Overal bed mobility: Needs Assistance Bed Mobility: Supine to Sit;Sit to Supine Rolling: Mod assist;Max assist   Supine to sit: Max assist Sit to supine: Max assist;Total assist;+2 for physical assistance      Transfers Overall transfer level: Needs assistance   Transfers: Sit to/from Stand;Lateral/Scoot Transfers           General transfer comment: lateral/scoot over level surfaces,  max/dep assist +2; sit/stand from chair surface, max/total assist +2 (with RW).  Heavy  L lateral lean this date  Ambulation/Gait             General Gait Details: unsafe/unable   Stairs             Wheelchair Mobility    Modified Rankin (Stroke Patients Only)       Balance Overall balance assessment: Needs assistance Sitting-balance support: No upper extremity supported;Feet supported Sitting balance-Leahy Scale: Poor       Standing balance-Leahy Scale: Zero                              Cognition Arousal/Alertness: Lethargic Behavior During Therapy: WFL for tasks assessed/performed Overall Cognitive Status: Difficult to assess                                 General Comments: consistently following one-two step commands, makes needs/ wants known to therapist      Exercises Other Exercises Other Exercises: Total assist for donning LE undergarments and pants in supine; mod assist for upper body dressing in unsupported sitting Other Exercises: Transitioned to recliner in preparation for transition to Tilt bed; constant cuing/assist for midline (progressive L latearl lean with fatigue).  Patient notably fatigued this date, closing eyes/falling asleep multiple times during session.  Opens eyes to cuing, but unable to maintain for meaningful participation with session despite constant change of position and stimulation. Other Exercises: Total lift via sit/stand lift for return to bed Other Exercises: Tilt deferred to next date due to decreased alertness (vitals stable and  WFL). RN informed/aware of transition.    General Comments        Pertinent Vitals/Pain Pain Assessment: Faces Faces Pain Scale: No hurt    Home Living                      Prior Function            PT Goals (current goals can now be found in the care plan section) Acute Rehab PT Goals PT Goal Formulation: With patient Time For Goal Achievement:  03/22/18 Potential to Achieve Goals: Fair Progress towards PT goals: Not progressing toward goals - comment    Frequency    Min 2X/week      PT Plan Current plan remains appropriate    Co-evaluation              AM-PAC PT "6 Clicks" Daily Activity  Outcome Measure  Difficulty turning over in bed (including adjusting bedclothes, sheets and blankets)?: Unable Difficulty moving from lying on back to sitting on the side of the bed? : Unable Difficulty sitting down on and standing up from a chair with arms (e.g., wheelchair, bedside commode, etc,.)?: Unable Help needed moving to and from a bed to chair (including a wheelchair)?: Total Help needed walking in hospital room?: Total Help needed climbing 3-5 steps with a railing? : Total 6 Click Score: 6    End of Session Equipment Utilized During Treatment: Gait belt Activity Tolerance: Patient limited by fatigue Patient left: in bed;with bed alarm set;Other (comment)(nursing to locate/place call bell) Nurse Communication: Mobility status PT Visit Diagnosis: Difficulty in walking, not elsewhere classified (R26.2);Muscle weakness (generalized) (M62.81)     Time: 1325-1450 PT Time Calculation (min) (ACUTE ONLY): 85 min  Charges:  $Therapeutic Activity: 68-82 mins                    G Codes:      Kemoni Ortega H. Manson PasseyBrown, PT, DPT, NCS 03/10/18, 9:37 PM [phone]

## 2018-03-10 NOTE — NC FL2 (Signed)
[place] LEVEL OF CARE SCREENING TOOL     IDENTIFICATION  Patient Name: Tommy Mathews Birthdate: 1960/11/17 Sex: male Admission Date (Current Location): 01/06/2018  [place] and [place] Number:  [name] [id number] [place] and Address:  [facility], [address] [place], [place] [address]      Provider Number: [id number]  Attending Physician Name and Address:  Saundra Shelling, MD  Relative Name and Phone Number:  [name], mother [phone]    Current Level of Care: Hospital Recommended Level of Care: [facility] Prior Approval Number:    Date Approved/Denied:   PASRR Number: [id number]  Discharge Plan: SNF    Current Diagnoses: Patient Active Problem List   Diagnosis Date Noted  . Severe recurrent major depression without psychotic features ([place]) 03/03/2018  . Bipolar I disorder, most recent episode depressed ([place]) 03/03/2018  . Schizophrenia ([place]) 02/27/2018  . Pressure injury of skin 01/18/2018  . GI bleed 01/06/2018  . Acute respiratory failure with hypoxia ([place]) 01/06/2018  . Severe sepsis with septic shock ([place]) 01/06/2018  . Multifocal pneumonia 01/06/2018  . UTI (urinary tract infection) 01/06/2018  . AKI (acute kidney injury) ([place]) 01/06/2018  . Ileus ([place]) 01/06/2018  . Diabetes ([place]) 01/06/2018    Orientation RESPIRATION BLADDER Height & Weight     Self  Tracheostomy(NEW TRACH: 01/23/18) Continent Weight: 208 lb 8 oz (94.6 kg) Height:  '5\' 10"'  (177.8 cm)  BEHAVIORAL SYMPTOMS/MOOD NEUROLOGICAL BOWEL NUTRITION STATUS  (None) (None) Continent Feeding tube(PEG placed 01/21/18)  AMBULATORY STATUS COMMUNICATION OF NEEDS Skin   Extensive Assist Non-Verbally PU Stage and Appropriate Care(Stage 2 on Coccyx)   PU Stage 2 Dressing: (Every 3 days)                   Personal Care Assistance Level of Assistance  Dressing, Feeding, Bathing Bathing Assistance: Maximum assistance Feeding  assistance: Limited assistance Dressing Assistance: Limited assistance     Functional Limitations Info  Sight, Hearing, Speech Sight Info: Adequate Hearing Info: Adequate Speech Info: Impaired    SPECIAL CARE FACTORS FREQUENCY  OT (By licensed OT), PT (By licensed PT), Speech therapy                    Contractures Contractures Info: Not present    Additional Factors Info  Code Status, Allergies Code Status Info: DNR Allergies Info: NKA Psychotropic Info: QUEtiapine (SEROQUEL) tablet 100 mg  Insulin Sliding Scale Info: insulin aspart (novoLOG) injection 0-20 Units every 4 hours Isolation Precautions Info: Contact precautions for MRSA     Current Medications (03/10/2018):  This is the current hospital active medication list Current Facility-Administered Medications  Medication Dose Route Frequency Provider Last Rate Last Dose  . acetaminophen (TYLENOL) suppository 650 mg  650 mg Rectal Q4H PRN Samaan, Maged, MD      . acetaminophen (TYLENOL) tablet 650 mg  650 mg Per Tube Q4H PRN Wilhelmina Mcardle, MD   650 mg at 02/25/18 0336  . acetylcysteine (MUCOMYST) 20 % nebulizer / oral solution 4 mL  4 mL Nebulization Q6H PRN Gladstone Lighter, MD   4 mL at 03/10/18 0800  . bisacodyl (DULCOLAX) suppository 10 mg  10 mg Rectal Daily PRN Wilhelmina Mcardle, MD   10 mg at 01/20/18 1048  . bisacodyl (DULCOLAX) suppository 10 mg  10 mg Rectal Daily PRN Wilhelmina Mcardle, MD   10 mg at 02/26/18 1501  . budesonide (PULMICORT) nebulizer solution 0.5 mg  0.5 mg Nebulization BID  Flora Lipps, MD   0.5 mg at 03/10/18 0800  . chlorhexidine gluconate (MEDLINE KIT) (PERIDEX) 0.12 % solution 15 mL  15 mL Mouth Rinse BID Wilhelmina Mcardle, MD   15 mL at 03/09/18 0820  . clonazePAM (KLONOPIN) tablet 0.25 mg  0.25 mg Oral BID He, Jun, MD   0.25 mg at 03/10/18 1751  . digoxin (LANOXIN) tablet 0.25 mg  0.25 mg Per Tube Daily Paraschos, Alexander, MD   0.25 mg at 03/09/18 1344  . docusate (COLACE) 50 MG/5ML  liquid 100 mg  100 mg Per Tube BID PRN Wilhelmina Mcardle, MD      . docusate (COLACE) 50 MG/5ML liquid 50 mg  50 mg Per Tube BID Cassandria Santee, MD   50 mg at 03/09/18 2113  . enoxaparin (LOVENOX) injection 40 mg  40 mg Subcutaneous Q24H Cassandria Santee, MD   40 mg at 03/10/18 0834  . feeding supplement (GLUCERNA 1.5 CAL) liquid 1,000 mL  1,000 mL Per Tube Continuous Gladstone Lighter, MD 70 mL/hr at 03/09/18 0500 1,000 mL at 03/09/18 0500  . FLUoxetine (PROZAC) 20 MG/5ML solution 20 mg  20 mg Oral Daily Samaan, Maged, MD   20 mg at 03/09/18 1344  . free water 150 mL  150 mL Per Tube Q8H Lafayette Dragon, MD   150 mL at 03/10/18 0630  . furosemide (LASIX) injection 20 mg  20 mg Intravenous Daily Cassandria Santee, MD   20 mg at 03/10/18 0835  . gabapentin (NEURONTIN) 250 MG/5ML solution 200 mg  200 mg Per Tube Q12H Lafayette Dragon, MD   200 mg at 03/09/18 2113  . hydrALAZINE (APRESOLINE) injection 10-40 mg  10-40 mg Intravenous Q4H PRN Lafayette Dragon, MD      . insulin aspart (novoLOG) injection 0-20 Units  0-20 Units Subcutaneous Q4H Wilhelmina Mcardle, MD   3 Units at 03/10/18 [phone]  . insulin glargine (LANTUS) injection 20 Units  20 Units Subcutaneous QHS Conforti, John, DO   20 Units at 03/09/18 2112  . ipratropium-albuterol (DUONEB) 0.5-2.5 (3) MG/3ML nebulizer solution 3 mL  3 mL Nebulization Q6H Gouru, Aruna, MD   3 mL at 03/10/18 0800  . ipratropium-albuterol (DUONEB) 0.5-2.5 (3) MG/3ML nebulizer solution 3 mL  3 mL Nebulization Q4H PRN Gouru, Aruna, MD      . ketoconazole (NIZORAL) 2 % cream   Topical Daily PRN Awilda Bill, NP      . lisinopril (PRINIVIL,ZESTRIL) tablet 10 mg  10 mg Per Tube Daily Lafayette Dragon, MD   10 mg at 03/10/18 5277  . magnesium oxide (MAG-OX) tablet 400 mg  400 mg Per Tube BID Flora Lipps, MD   400 mg at 03/10/18 0832  . MEDLINE mouth rinse  15 mL Mouth Rinse 10 times per day Wilhelmina Mcardle, MD   15 mL at 03/10/18 0239  . metoprolol tartrate (LOPRESSOR)  tablet 12.5 mg  12.5 mg Per Tube Q6H Lafayette Dragon, MD   12.5 mg at 03/10/18 8242  . nutrition supplement (JUVEN) (JUVEN) powder packet 1 packet  1 packet Per Tube BID Flora Lipps, MD   1 packet at 03/09/18 2113  . nystatin (MYCOSTATIN) 100000 UNIT/ML suspension 500,000 Units  5 mL Oral QID Loletha Grayer, MD   500,000 Units at 03/09/18 2113  . pantoprazole sodium (PROTONIX) 40 mg/20 mL oral suspension 40 mg  40 mg Per Tube Daily Wilhelmina Mcardle, MD   40 mg at 03/10/18 3536  . QUEtiapine (SEROQUEL)  tablet 200 mg  200 mg Per Tube QHS Fritzi Mandes, MD   200 mg at 03/09/18 2113  . QUEtiapine (SEROQUEL) tablet 400 mg  400 mg Oral Daily Gladstone Lighter, MD   400 mg at 03/09/18 0817  . sodium chloride flush (NS) 0.9 % injection 10-40 mL  10-40 mL Intracatheter PRN Wilhelmina Mcardle, MD   30 mL at 03/08/18 2057  . valproic acid (DEPAKENE) solution 500 mg  500 mg Per Tube BID Cassandria Santee, MD   500 mg at 03/09/18 2113     Discharge Medications: Please see discharge summary for a list of discharge medications.  Relevant Imaging Results:  Relevant Lab Results:   Additional Information SSN: [id number]  Annamaria Boots, [place]

## 2018-03-10 NOTE — Progress Notes (Signed)
Suctioned lg amt of thick tan secretions

## 2018-03-11 DIAGNOSIS — R079 Chest pain, unspecified: Secondary | ICD-10-CM

## 2018-03-11 LAB — GLUCOSE, CAPILLARY
GLUCOSE-CAPILLARY: 81 mg/dL (ref 65–99)
GLUCOSE-CAPILLARY: 123 mg/dL — AB (ref 65–99)
GLUCOSE-CAPILLARY: 154 mg/dL — AB (ref 65–99)
GLUCOSE-CAPILLARY: 105 mg/dL — AB (ref 65–99)
GLUCOSE-CAPILLARY: 148 mg/dL — AB (ref 65–99)
Glucose-Capillary: 130 mg/dL — ABNORMAL HIGH (ref 65–99)

## 2018-03-11 LAB — TROPONIN I

## 2018-03-11 MED ORDER — OXYCODONE-ACETAMINOPHEN 5-325 MG PO TABS
1.0000 | ORAL_TABLET | Freq: Four times a day (QID) | ORAL | Status: DC | PRN
  Administered 2018-03-12 – 2018-03-14 (×2): 1
  Filled 2018-03-11 (×2): qty 1

## 2018-03-11 NOTE — Progress Notes (Signed)
Physical Therapy Treatment Patient Details Name: Tommy Mathews MRN: [id number] DOB: 1961/02/24 Today's Date: 03/11/2018    History of Present Illness 57 yo male presented to ER (4/8) secondary to respiratory distress (requiring intubation in ED); admitted with severe sepsis, shock due to multifocal PNA (possible aspiration event), UTI.  Mod amount of coffee-ground material suctioned through OG tube initially; suspected with GIB.  Hospital course further significant for difficulty weaning and persistent ventilator dyssynchrony, requiring administration of vecuronium; afib; PEG placement (4/23), trach placement (4/25); intermittent afib with RVR (controlled with medication as needed).  Required extended time for vent weaning; now tolerating trach collar this date.    PT Comments    Initiated progressive tilt/transition to upright on Tilt bed.  Tolerated total tilt for approx 20 minutes, 45 degrees max and 115 pounds of LE WBing. Vitals stable and WFL throughout; mild drop in BP with initial transition, but accommodates to position, asymptomatic.  Increased alertness and active participation/effort noted in upright position.  Plan to continue as appropriate.   Follow Up Recommendations  SNF     Equipment Recommendations       Recommendations for Other Services       Precautions / Restrictions Precautions Precautions: Fall;Other (comment) Precaution Comments: NPO/PEG, trach Restrictions Weight Bearing Restrictions: No    Mobility  Bed Mobility                  Transfers                    Ambulation/Gait                 Stairs             Wheelchair Mobility    Modified Rankin (Stroke Patients Only)       Balance                                            Cognition Arousal/Alertness: Lethargic Behavior During Therapy: WFL for tasks assessed/performed                                   General Comments:  consistently following one-two step commands, makes needs/ wants known to therapist      Exercises Other Exercises Other Exercises: Initiated use of Vital Go Tilt bed this date to promote LE WBing, tolerance to upright.  Progressively (incrementally) elevated to 45 degrees upright; mild drop in BP, but accommodates to position and stabilizes appropriately.  Significant L lateral lean with fatigue (and progressive upright), requiring frequent cuing/assist for correction to midline.  With bilat UEs positioned on bedrails, able to self-correct position 75% time.  While upright, progressed from static position to incorporation of bilat UE reaching, ADL/self-care activities and bilat LE TKE (see OT note for details).  Good participation/effort with all tasks; improved alertness as session continued.  Tolerated total tilt for approx 20 minutes, 45 degrees max and 115 pounds of LE WBing.  Returned to supine and positioned to comfort; vitals remain stable and WFL.    General Comments        Pertinent Vitals/Pain Pain Assessment: Faces Faces Pain Scale: No hurt    Home Living  Prior Function            PT Goals (current goals can now be found in the care plan section) Acute Rehab PT Goals PT Goal Formulation: With patient Time For Goal Achievement: 03/22/18 Potential to Achieve Goals: Fair Progress towards PT goals: Progressing toward goals    Frequency    Min 2X/week      PT Plan Current plan remains appropriate    Co-evaluation              AM-PAC PT "6 Clicks" Daily Activity  Outcome Measure  Difficulty turning over in bed (including adjusting bedclothes, sheets and blankets)?: Unable Difficulty moving from lying on back to sitting on the side of the bed? : Unable Difficulty sitting down on and standing up from a chair with arms (e.g., wheelchair, bedside commode, etc,.)?: Unable Help needed moving to and from a bed to chair (including a  wheelchair)?: Total Help needed walking in hospital room?: Total Help needed climbing 3-5 steps with a railing? : Total 6 Click Score: 6    End of Session Equipment Utilized During Treatment: Oxygen Activity Tolerance: Patient tolerated treatment well Patient left: in bed;with call bell/phone within reach;with bed alarm set(chair alarm pad placed under patient in bed) Nurse Communication: Mobility status PT Visit Diagnosis: Difficulty in walking, not elsewhere classified (R26.2);Muscle weakness (generalized) (M62.81)     Time: [id number] PT Time Calculation (min) (ACUTE ONLY): 57 min  Charges:  $Therapeutic Activity: 23-37 mins                    G Codes:       Bryce Kimble H. Manson PasseyBrown, PT, DPT, NCS 03/11/18, 3:46 PM [phone]

## 2018-03-11 NOTE — Progress Notes (Signed)
Occupational Therapy Treatment Patient Details Name: Tommy Mathews MRN: [id number] DOB: [birth year] Today's Date: 03/11/2018    History of present illness 57 yo male presented to ER (4/8) secondary to respiratory distress (requiring intubation in ED); admitted with severe sepsis, shock due to multifocal PNA (possible aspiration event), UTI.  Mod amount of coffee-ground material suctioned through OG tube initially; suspected with GIB.  Hospital course further significant for difficulty weaning and persistent ventilator dyssynchrony, requiring administration of vecuronium; afib; PEG placement (4/23), trach placement (4/25); intermittent afib with RVR (controlled with medication as needed).  Required extended time for vent weaning; now tolerating trach collar this date.   OT comments  Initiated progressive tilt/transition to upright on Tilt bed.  Tolerated total tilt for approx 20 minutes, 45 degrees max and 115 pounds of LE WBing. Pt participated in dynamic UB weight shifting and reaching as well as grooming tasks with set up and occasional verbal cues to support upright posture. Vitals stable and WFL throughout; mild drop in BP with initial transition, but accommodates to position, asymptomatic.  Increased alertness and active participation/effort noted in upright position.  Plan to continue as appropriate.   Follow Up Recommendations  SNF    Equipment Recommendations       Recommendations for Other Services      Precautions / Restrictions Precautions Precautions: Fall;Other (comment) Precaution Comments: NPO/PEG, trach Restrictions Weight Bearing Restrictions: No       Mobility Bed Mobility                  Transfers                      Balance                                           ADL either performed or assessed with clinical judgement   ADL Overall ADL's : Needs assistance/impaired Eating/Feeding: NPO Eating/Feeding Details (indicate  cue type and reason): PEG tube   Grooming Details (indicate cue type and reason): In tilt bed, at approx 45 degrees and 115# WBing through BLE, pt able to wash face with set up, fatigued quickly                                     Vision       Perception     Praxis      Cognition Arousal/Alertness: Lethargic Behavior During Therapy: [facility] for tasks assessed/performed Overall Cognitive Status: Difficult to assess                                 General Comments: consistently following one-two step commands, makes needs/ wants known to therapist        Exercises Other Exercises Other Exercises: Initiated use of Vital Go Tilt bed this date to promote LE WBing, tolerance to upright.  Progressively (incrementally) elevated to 45 degrees upright; mild drop in BP, but accommodates to position and stabilizes appropriately.  Significant L lateral lean with fatigue (and progressive upright), requiring frequent cuing/assist for correction to midline.  With bilat UEs positioned on bedrails, able to self-correct position 75% time.  While upright, progressed from static position to incorporation of bilat UE reaching in all planes, grooming tasks,  and bilat LE TKE.  Good participation/effort with all tasks; improved alertness as session continued.  Tolerated total tilt for approx 20 minutes, 45 degrees max and 115 pounds of LE WBing.  Returned to supine and positioned to comfort; vitals remain stable and WFL.   Shoulder Instructions       General Comments      Pertinent Vitals/ Pain       Pain Assessment: Faces Faces Pain Scale: No hurt Pain Intervention(s): Limited activity within patient's tolerance;Monitored during session  Home Living                                          Prior Functioning/Environment              Frequency  Min 2X/week        Progress Toward Goals  OT Goals(current goals can now be found in the care plan  section)  Progress towards OT goals: Progressing toward goals  Acute Rehab OT Goals OT Goal Formulation: With patient Time For Goal Achievement: 03/18/18 Potential to Achieve Goals: Good  Plan Discharge plan remains appropriate;Frequency remains appropriate    Co-evaluation    PT/OT/SLP Co-Evaluation/Treatment: Yes Reason for Co-Treatment: Complexity of the patient's impairments (multi-system involvement);For patient/therapist safety;To address functional/ADL transfers   OT goals addressed during session: ADL's and self-care;Strengthening/ROM      AM-PAC PT "6 Clicks" Daily Activity     Outcome Measure   Help from another person eating meals?: Total Help from another person taking care of personal grooming?: A Little Help from another person toileting, which includes using toliet, bedpan, or urinal?: Total Help from another person bathing (including washing, rinsing, drying)?: A Lot Help from another person to put on and taking off regular upper body clothing?: A Lot Help from another person to put on and taking off regular lower body clothing?: A Lot 6 Click Score: 11    End of Session    OT Visit Diagnosis: Other abnormalities of gait and mobility (R26.89);Muscle weakness (generalized) (M62.81);Other symptoms and signs involving cognitive function   Activity Tolerance Patient tolerated treatment well   Patient Left in bed;with call bell/phone within reach;with bed alarm set   Nurse Communication          Time: [id number] OT Time Calculation (min): 46 min  Charges: OT General Charges $OT Visit: 1 Visit OT Treatments $Self Care/Home Management : 23-37 mins  Richrd PrimeJamie Stiller, MPH, MS, OTR/L ascom [phone] 03/11/18, 4:00 PM

## 2018-03-11 NOTE — Progress Notes (Addendum)
[facility] - [place] at [facility]   PATIENT NAME: Tommy Mathews    MR#:  [id number]  DATE OF BIRTH:  May 18, 1961  SUBJECTIVE:  CHIEF COMPLAINT:   Chief Complaint  Patient presents with  . Respiratory Distress  -Patient seen and evaluated today - Has a special mobile bed  - nonverbal, on trach collar - no change. Still has secretions - complaints of chest pain  REVIEW OF SYSTEMS:  Review of Systems  Unable to perform ROS: Patient nonverbal    DRUG ALLERGIES:  No Known Allergies  VITALS:  Blood pressure (!) 141/82, pulse 74, temperature 98.9 F (37.2 C), temperature source Oral, resp. rate 18, height 5\' 10"  (1.778 m), weight 94.6 kg (208 lb 8 oz), SpO2 94 %.  PHYSICAL EXAMINATION:  Physical Exam  GENERAL:  57 y.o.-year-old patient lying in the bed with no acute distress.  EYES: Pupils equal, round, reactive to light and accommodation. No scleral icterus. Extraocular muscles intact.  HEENT: Head atraumatic, normocephalic. Oropharynx and nasopharynx clear.  NECK:  Supple, no jugular venous distention. No thyroid enlargement, no tenderness.  Tracheostomy in place LUNGS: Normal breath sounds bilaterally, no wheezing, rales,rhonchi or crepitation. No use of accessory muscles of respiration.  Decreased bibasilar breath sounds CARDIOVASCULAR: S1, S2 normal. No murmurs, rubs, or gallops.  ABDOMEN: Soft, nontender, nondistended. Bowel sounds present. No organomegaly or mass.  PEG tube is present EXTREMITIES: No pedal edema, cyanosis, or clubbing.  NEUROLOGIC: No facial droop noted.  Cranial nerves otherwise seem intact.  Following simple commands.  Motor strength is same in all extremities.  Sensation is intact.  PSYCHIATRIC: The patient is alert   SKIN: No obvious rash, lesion, or ulcer.    LABORATORY PANEL:   CBC Recent Labs  Lab 03/05/18 0902  WBC 12.1*  HGB 9.6*  HCT 30.3*  PLT 371    ------------------------------------------------------------------------------------------------------------------  Chemistries  Recent Labs  Lab 03/05/18 0902  NA 137  K 3.9  CL 101  CO2 29  GLUCOSE 126*  BUN 22*  CREATININE 0.66  CALCIUM 8.6*  AST 18  ALT 12*  ALKPHOS 61  BILITOT 0.3   ------------------------------------------------------------------------------------------------------------------  Cardiac Enzymes No results for input(s): TROPONINI in the last 168 hours. ------------------------------------------------------------------------------------------------------------------  RADIOLOGY:  No results found.  EKG:   Orders placed or performed during the hospital encounter of 01/06/18  . EKG 12-Lead  . EKG 12-Lead    ASSESSMENT AND PLAN:   57 year old male with past medical history significant for traumatic brain injury, paroxysmal A. fib, systolic heart failure and diabetes presents to hospital secondary to sepsis and respiratory failure.  1.  Severe hypoxic and hypercapnic respiratory failure-patient was intubated and then had a tracheostomy this admission. -Currently on trach collar saturating well. -prn suctioning, mucinex for thick secretions - needs SNF that supports trach collar -Social worker f/u  2.  Sepsis-secondary to VAP Klebsiella pneumonia - also positive for bacteremia for klebsiella - on ancef for 14 days, until 03/11/18. -DC ancef from today  3. Chest pain -Checked 12 lead EKG : Normal sinus rhythm -No ST segment changes noted -Cycle troponin to rule out ischemia   4.  Paroxysmal atrial fibrillation-rate controlled.  Continue digoxin and metoprolol. - not a Candidate for anticoagulation due to recent GI bleed  5.  GI bleed-with recent negative endoscopy.  Hemoglobin is stable at this time.  Continue PPI  6.  History of TBI/depression and schizophrenia-appreciate psych consult.  Continue valproic acid.  Also on Seroquel  7.  Diabetes mellitus-continue Lantus and sliding scale insulin  8.  Stage II decubitus ulcer on buttock-appreciate wound consult.  Continue current management  9.  DVT prophylaxis-on Lovenox  10.  Disposition-social worker working on placement and getting his Medicaid  to the right rehab center    All the records are reviewed and case discussed with Care Management/Social Workerr. Management plans discussed with the patient, family and they are in agreement.  CODE STATUS: DNR  TOTAL TIME TAKING CARE OF THIS PATIENT:  25 minutes.   POSSIBLE D/C IN 1-2 DAYS, DEPENDING ON CLINICAL CONDITION AND AVAILABILITY OF BED AT SNF   Ihor Austin M.D on 03/11/2018 at 10:22 AM  Between 7am to 6pm - Pager - [phone]  After 6pm go to [email or web address] - Social research officer, government  [facility]  Office  [phone]  CC: Primary care physician; Marcina Millard, MD

## 2018-03-11 NOTE — Progress Notes (Signed)
OT Cancellation Note  Patient Details Name: Tommy Mathews MRN: [id number] DOB: Mar 25, 1961   Cancelled Treatment:    Reason Eval/Treat Not Completed: Fatigue/lethargy limiting ability to participate. Spoke with RN coming out of pt's room, states pt is very tired. Met with pt, sister and mother in room as well. Pt fatigued, difficult keeping his eyes open but does respond to OT's voice. Pt request OT come back later this date after he has had time to rest. Family in agreement. PT notified as well to help coordinate afternoon session. Will re-attempt as pt is able to participate.  Jeni Salles, MPH, MS, OTR/L ascom [phone] 03/11/18, 11:37 AM

## 2018-03-11 NOTE — Clinical Social Work Note (Signed)
CSW [place] with updated information for SNF. Patient has received no bed offers at this time. CSW has also reached out to several of the facilities with no success. CSW made CSW/CM management aware of situation. Per Management patient will be placed on the difficult to place list. CSW will continue to look for a SNF bed for patient.   Ruthe Mannanandace Jyquan Kenley MSW, [address]CS[address] [phone]

## 2018-03-12 ENCOUNTER — Inpatient Hospital Stay: Payer: Medicaid Other

## 2018-03-12 DIAGNOSIS — J9600 Acute respiratory failure, unspecified whether with hypoxia or hypercapnia: Secondary | ICD-10-CM

## 2018-03-12 LAB — BLOOD GAS, ARTERIAL
ACID-BASE EXCESS: 9 mmol/L — AB (ref 0.0–2.0)
Bicarbonate: 34.1 mmol/L — ABNORMAL HIGH (ref 20.0–28.0)
FIO2: 0.35
O2 SAT: 94.5 %
Patient temperature: 37
pCO2 arterial: 48 mmHg (ref 32.0–48.0)
pH, Arterial: 7.46 — ABNORMAL HIGH (ref 7.350–7.450)
pO2, Arterial: 69 mmHg — ABNORMAL LOW (ref 83.0–108.0)

## 2018-03-12 LAB — GLUCOSE, CAPILLARY
GLUCOSE-CAPILLARY: 135 mg/dL — AB (ref 65–99)
Glucose-Capillary: 123 mg/dL — ABNORMAL HIGH (ref 65–99)
Glucose-Capillary: 133 mg/dL — ABNORMAL HIGH (ref 65–99)
Glucose-Capillary: 133 mg/dL — ABNORMAL HIGH (ref 65–99)
Glucose-Capillary: 153 mg/dL — ABNORMAL HIGH (ref 65–99)
Glucose-Capillary: 159 mg/dL — ABNORMAL HIGH (ref 65–99)
Glucose-Capillary: 182 mg/dL — ABNORMAL HIGH (ref 65–99)

## 2018-03-12 LAB — TROPONIN I
Troponin I: <0.03 ng/mL (ref <0.03)
Troponin I: <0.03 ng/mL (ref <0.03)

## 2018-03-12 MED ORDER — IPRATROPIUM-ALBUTEROL 0.5-2.5 (3) MG/3ML IN SOLN
3.0000 mL | RESPIRATORY_TRACT | Status: DC
  Administered 2018-03-12 – 2018-03-19 (×42): 3 mL via RESPIRATORY_TRACT
  Filled 2018-03-12 (×42): qty 3

## 2018-03-12 MED ORDER — ACETYLCYSTEINE 20 % IN SOLN
4.0000 mL | Freq: Two times a day (BID) | RESPIRATORY_TRACT | Status: DC
  Administered 2018-03-12 – 2018-03-16 (×9): 4 mL via RESPIRATORY_TRACT
  Filled 2018-03-12 (×9): qty 4

## 2018-03-12 NOTE — Plan of Care (Signed)
Wked with representative for the tilt bed.  Patient stood at 45 degrees for 5 min and 50 degrees for 10 min.  We then put pt in chair position for approx 15 min.  He tolerated really well.

## 2018-03-12 NOTE — Evaluation (Addendum)
[name] Speaking [facility]ve - Evaluation Patient Details  Name: Tommy Mathews MRN: [id number] Date of Birth: 08/07/1961  Today's Date: 03/12/2018 Time: 0915-1000 SLP Time Calculation (min) (ACUTE ONLY): 45 min  Past Medical History:  Past Medical History:  Diagnosis Date  . Diabetes (HCC)   . GERD (gastroesophageal reflux disease)   . HLD (hyperlipidemia)   . HTN (hypertension)    Past Surgical History:  Past Surgical History:  Procedure Laterality Date  . PEG PLACEMENT N/A 01/21/2018   Procedure: PERCUTANEOUS ENDOSCOPIC GASTROSTOMY (PEG) PLACEMENT;  Surgeon: Wyline Mood, MD;  Location: [facility] ENDOSCOPY;  Service: Gastroenterology;  Laterality: N/A;  . TRACHEOSTOMY TUBE PLACEMENT N/A 01/23/2018   Procedure: TRACHEOSTOMY;  Surgeon: Bud Face, MD;  Location: [facility] ORS;  Service: ENT;  Laterality: N/A;   HPI:   Pt is a 57 y.o. male w/ h/o DM, GERD, HLD, HTN who presents with respiratory failure with hypoxia.  Patient required intubation here in the ED.  He also had a large amount of dark coffee-ground type material suctioned from his OG tube.  Strong concern for possible GI bleed.  Patient is on Xarelto.  He presented from a facility with little known medical history, he does have a med list which shows Xarelto on a number of other medications.  He is also found to have multifocal pneumonia, hypertension, likely UTI.     Assessment / Plan / Recommendation Clinical Impression  Pt was seen today for PMV trial, toleration of PMV placement, and use of verbal communication w/ others. Pt was alert, oriented to self, surroundings. He followed basic commands during PMV session w/ SLP. Pt has a Shiley 8mm w/ Cuff - Cuff is deflated now at baseline per RT. Pt presented w/ decreased O2 sats on 35% at 8 liters - 76-84%. RT and NSG reported episodes and charted notes that this is characteristic of pt's baseline when in ICU. Pt was suctioned by SLP/NSG and RT x2 w/ O2 support increased to 40% resulting  in O2 sats in the upper 80's. In hopes of encouraging pt to cough to expectorate tracheal secretions vs tracheal suctioning again, NSG/RT agreed w/ SLP and the trial of PMV placement w/ encouragement to cough. Finger occlusion revealed adequate voicing and no discomfort. PMV placed for ~3-4 minutes w/ similar results as well as no further decline in O2 sats - often remaining ~84% w/ PMV placed. Pt was unable to attain a strong, effective cough independently, however, he was able converse w/ SLP anwering a few questions verbally re: self and family. Pt's vocal quality was clear w/ adequate volume. He exhibited no decline respiratory effort/rate; no discomfort reported. PMV then removed d/t low O2 sats. Pt nodded in agreement.  ST services will discuss parameters for pt's ability to wear the PMV for verbal communication w/ others as well as address wants/needs and communication re: his care w/ Staff in light of his declined O2 saturations. NSG and MD updated on pt's presentation w/ PMV use/placement as well as concern for declined O2 sats at rest.   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)    SLP Assessment  Patient needs continued Speech Lanaguage Pathology Services(TBD)    Follow Up Recommendations  LTACH(TBD)    Frequency and Duration (TBD)  (TBD)    PMSV Trial PMSV was placed for: ~3-4 minutes Able to redirect subglottic air through upper airway: Yes Able to Attain Phonation: Yes Voice Quality: Normal Able to Expectorate Secretions: No Level of Secretion Expectoration with PMSV: Not observed Breath Support  for Phonation: Adequate(appeared for the brief time placed) Intelligibility: Intelligible Respirations During Trial: 25 SpO2 During Trial: (!) 84 % Pulse During Trial: 91 Behavior: Alert;Cooperative;Expresses self well;Good eye contact;Responsive to questions   Tracheostomy Tube  Additional Tracheostomy Tube Assessment Trach Collar Period: ongoing Secretion  Description: moderate; yellow Frequency of Tracheal Suctioning: frequent Level of Secretion Expectoration: Tracheal    Vent Dependency  Vent Dependent: No FiO2 (%): 40 %    Cuff Deflation Trial  GO Tolerated Cuff Deflation: Yes Length of Time for Cuff Deflation Trial: (ongoing; at baseline) Behavior: Good eye contact;Smiling;Cooperative Cuff Deflation Trial - Comments: cuff deflated at baseline        Tommy Mathews 03/12/2018, 2:30 PM Jerilynn SomKatherine Rahmah Mccamy, MS, CCC-SLP

## 2018-03-12 NOTE — Progress Notes (Signed)
[employer] - [place] at [facility]   PATIENT NAME: Tommy Mathews    MR#:  [id number]  DATE OF BIRTH:  June 08, 1961  SUBJECTIVE:  CHIEF COMPLAINT:   Chief Complaint  Patient presents with  . Respiratory Distress  Acute respiratory distress this morning due to copious secretions, now stable after aggressive suctioning, blood gas noted for appropriate compensation/mild hypoxia, chest x-ray noted for improving infiltrates bilaterally, case discussed with ICU nursing staff-recommended floor monitoring with close observation with frequent suctioning  REVIEW OF SYSTEMS:  CONSTITUTIONAL: No fever, fatigue or weakness.  EYES: No blurred or double vision.  EARS, NOSE, AND THROAT: No tinnitus or ear pain.  RESPIRATORY: No cough, shortness of breath, wheezing or hemoptysis.  CARDIOVASCULAR: No chest pain, orthopnea, edema.  GASTROINTESTINAL: No nausea, vomiting, diarrhea or abdominal pain.  GENITOURINARY: No dysuria, hematuria.  ENDOCRINE: No polyuria, nocturia,  HEMATOLOGY: No anemia, easy bruising or bleeding SKIN: No rash or lesion. MUSCULOSKELETAL: No joint pain or arthritis.   NEUROLOGIC: No tingling, numbness, weakness.  PSYCHIATRY: No anxiety or depression.   ROS  DRUG ALLERGIES:  No Known Allergies  VITALS:  Blood pressure 111/70, pulse 99, temperature 98.9 F (37.2 C), temperature source Oral, resp. rate 20, height 5\' 10"  (1.778 m), weight 99.8 kg (220 lb), SpO2 93 %.  PHYSICAL EXAMINATION:  GENERAL:  57 y.o.-year-old patient lying in the bed with no acute distress.  EYES: Pupils equal, round, reactive to light and accommodation. No scleral icterus. Extraocular muscles intact.  HEENT: Head atraumatic, normocephalic. Oropharynx and nasopharynx clear.  NECK:  Supple, no jugular venous distention. No thyroid enlargement, no tenderness.  LUNGS: Normal breath sounds bilaterally, no wheezing, rales,rhonchi or crepitation. No use of accessory muscles of respiration.   CARDIOVASCULAR: S1, S2 normal. No murmurs, rubs, or gallops.  ABDOMEN: Soft, nontender, nondistended. Bowel sounds present. No organomegaly or mass.  EXTREMITIES: No pedal edema, cyanosis, or clubbing.  NEUROLOGIC: Cranial nerves II through XII are intact. Muscle strength 5/5 in all extremities. Sensation intact. Gait not checked.  PSYCHIATRIC: The patient is alert and oriented x 3.  SKIN: No obvious rash, lesion, or ulcer.   Physical Exam LABORATORY PANEL:   CBC No results for input(s): WBC, HGB, HCT, PLT in the last 168 hours. ------------------------------------------------------------------------------------------------------------------  Chemistries  No results for input(s): NA, K, CL, CO2, GLUCOSE, BUN, CREATININE, CALCIUM, MG, AST, ALT, ALKPHOS, BILITOT in the last 168 hours.  Invalid input(s): GFRCGP ------------------------------------------------------------------------------------------------------------------  Cardiac Enzymes Recent Labs  Lab 03/12/18 0205 03/12/18 1016  TROPONINI <0.03 <0.03   ------------------------------------------------------------------------------------------------------------------  RADIOLOGY:  Dg Chest 1 View  Result Date: 03/12/2018 CLINICAL DATA:  Hypoxia EXAM: CHEST  1 VIEW COMPARISON:  03/03/2018 FINDINGS: Cardiac shadow is stable. Tracheostomy tube is again seen. Right-sided PICC line is noted in satisfactory position. Previously seen infiltrative changes are again identified but mildly improved when compared with the previous exam. No bony abnormality is noted. IMPRESSION: Persistent but improving infiltrates bilaterally. Electronically Signed   By: Alcide Clever M.D.   On: 03/12/2018 10:09    ASSESSMENT AND PLAN:  57 year old male with past medical history significant for traumatic brain injury, paroxysmal A. fib, systolic heart failure and diabetes presents to hospital secondary to sepsis and respiratory failure.  *Acute Severe  hypoxic and hypercapnic respiratory failure Patient had an episode of respiratory distress this morning due to secretions, tube feeds held for now, will continue close medical monitoring with frequent suctioning and discussion with ICU nursing staff and floor nurse Did require ICU  care, vent management, receive tracheostomy this admission, on trach collar, continue mucinex, awaiting SNF that supports trach collar-awaiting placement   *Acute Sepsis Resolved secondary to VAP Klebsiella pneumonia Treated with 14-day course of Ancef-discontinued March 11, 2018  *Acute atypical chest pain  Ruled out for acute coronary syndrome, EKG benign  *Paroxysmal atrial fibrillation Controlled on digoxin and metoprolol not a Candidate for anticoagulation due to recent GI bleed  *Hx of GIB recent negative endoscopy Continue PPI  *History of TBI/depression and schizophrenia Psychiatry did see patient while in-house  Continue valproic acid and Seroquel  * Diabetes mellitus Stable on current regiment   *Chronic stage II decubitus ulcer on buttock- Wound care nurse consulted-continue local wound care    Disposition-social worker working on placement and getting his Medicaid to the right rehab center   All the records are reviewed and case discussed with Care Management/Social Workerr. Management plans discussed with the patient, family and they are in agreement.  CODE STATUS: DNR  TOTAL TIME TAKING CARE OF THIS PATIENT: 35 minutes.     POSSIBLE D/C IN 1-3 DAYS, DEPENDING ON CLINICAL CONDITION.   Evelena AsaMontell D Salary M.D on 03/12/2018   Between 7am to 6pm - Pager - [phone]  After 6pm go to [email or web address] - password [email or web address]  [facility] [place] [facility]  Office  [phone]  CC: Primary care physician; Marcina MillardParaschos, Alexander, MD  Note: This dictation was prepared with Dragon dictation along with smaller phrase technology. Any transcriptional errors that result from this process are  unintentional.

## 2018-03-12 NOTE — Progress Notes (Signed)
Came to assess patient per request of SWOT RN and 1C RN, Corrie DandyMary. Patient had incident at 08:59 where O2 saturations dropped (see vital signs flowsheets). Patient had required suctioning and had very thick secretions and FiO2 increased to 40%. Patient was resting comfortably in bed when this RN arrived connected to continuous pulse ox monitoring, breathing unlabored. He was able to nod head/shake his head to questions and denied shortness of breath. Lungs were diminished. Patient had ABG and chest x-ray done this morning, see results flowsheet. Discussed care with patient's RN Corrie DandyMary and SWOT RN Merry ProudBrandi. Currently Corrie DandyMary and [place] felt patient was appropriate to stay on 1C, this RN concurred. Will discuss care with patient's MD.

## 2018-03-12 NOTE — Progress Notes (Signed)
Spoke with Dr. Katheren ShamsSalary on the phone about the patient's condition with desaturation incident earlier and thick secretions. MD discussed patient's condition and this RN confirmed that patient's RN Corrie DandyMary felt patient's condition was still appropriate for 1C level of care. MD ordered pulmonology consult and mucomyst nebulizers and discontinued transfer to step down discontinued. This RN spoke with Jesse SansMary RN and Bambi RT with updated plan of care.

## 2018-03-12 NOTE — Progress Notes (Signed)
[facility]* [facility]     Assessment and Plan:  Acute on hypoxic respiratory failure with copious secretions.  Poor airway clearance due to tracheostomy and weakness/debility.  S/p trach and feeding tube.   D/w RN; continue suctioning about every 2 hours today.  Will increase nebs to q4, to be followed by suctioning.  Agree with mucomyst.  Advance activity as tolerated (up in chair during day if feasible).   Date: 03/12/2018  MRN# [id number] Tommy Mathews [birth year]   Tommy Mathews is a 57 y.o. old male seen in follow up for chief complaint of  Chief Complaint  Patient presents with  . Respiratory Distress     HPI:  The patient is a 57 year old male admitted to the hospital on 01/06/2018 for acute GI bleeding.  He had acute hypoxic respiratory failure at that time and required intubation.  His course was complicated by a severe pneumonia, atrial fibrillation, acute kidney injury, ARDS.  Over the ensuing weeks he improved, however he could not be weaned on the ventilator.  He subsequently underwent a PEG, then trach on 4/25 He was seen by cardiology, noted to have dilated cardiomyopathy, maintained on amiodarone.  Currently the pulmonary service was reconsulted as patient has been having increasing dyspnea on the floor with copious secretions, requiring frequent suctioning.  He currently has a size 8 trach with cuff deflated.  He is requiring oxygen at 35% - 40% trach collar, he is requiring frequent suctioning, approximately every 1.5 hours over the past shift, but is now doing better.   He has been started on Mucomyst nebulizer today, he remains on Pulmicort twice daily.  Imaging personally reviewed, chest x-ray 03/12/2018; bilateral chronic infiltrates, unchanged to slightly improved from previous.  MAJOR EVENTS/TEST RESULTS: 04/08 Admission as above 04/08 CT chest: Multifocal severe pneumonia with RUL collapse and soft tissue obstructing RUL bronchus. 04/08 CTAP: Mild  small bowel ileus, potential enteritis. Nasogastric tube terminates in distal stomach. Cholelithiasis without CT findings of acute cholecystitis.Consider bronchoscopy.  04/09 nephrology consultation: No acute indication for dialysis at present. 04/09 gastroenterology consultation: Recommend high-dose PPI therapy. 04/10 severely hypoxemic despite 100% FiO2.  Recruitment maneuver with significant improvement.  PEEP increased and ARDS strategy on ventilator implemented.  Neuromuscular blockade implemented due to ventilator dyssynchrony. 04/11 PAF with RVR > converted with amiodarone. Gas exchange improving. Off Nimbex 04/13 High airway pressure with vent dyssynchony; given Vecuronium; FIO2 increased to 50%  4/13 Amiodarone D/C because of SB 4/15 increased HR, EF 20% afib with RVR 4/16 remains intubated 4/17 failed weaning trials, HR remains elevated 4/23 PEG placement 4/25 tracheostomy 6/08 transfer to floor; tolerated trach collar x48 hours 06/12 Desat with copious secretions.   **Echocardiogram 01/10/2018; EF equals 20%, PA peak pressure equals 40 mmHg.    Medication:    Current Facility-Administered Medications:  .  acetaminophen (TYLENOL) suppository 650 mg, 650 mg, Rectal, Q4H PRN, Samaan, Maged, MD .  acetaminophen (TYLENOL) tablet 650 mg, 650 mg, Per Tube, Q4H PRN, Merwyn KatosSimonds, David B, MD, 650 mg at 03/11/18 1127 .  acetylcysteine (MUCOMYST) 20 % nebulizer / oral solution 4 mL, 4 mL, Nebulization, Q6H PRN, Enid BaasKalisetti, Radhika, MD, 4 mL at 03/10/18 0800 .  acetylcysteine (MUCOMYST) 20 % nebulizer / oral solution 4 mL, 4 mL, Nebulization, BID, Salary, Montell D, MD, 4 mL at 03/12/18 1304 .  bisacodyl (DULCOLAX) suppository 10 mg, 10 mg, Rectal, Daily PRN, Merwyn KatosSimonds, David B, MD, 10 mg at 01/20/18 1048 .  bisacodyl (DULCOLAX) suppository 10 mg, 10  mg, Rectal, Daily PRN, Merwyn Katos, MD, 10 mg at 02/26/18 1501 .  budesonide (PULMICORT) nebulizer solution 0.5 mg, 0.5 mg, Nebulization, BID,  Kasa, Kurian, MD, 0.5 mg at 03/12/18 0834 .  clonazePAM (KLONOPIN) tablet 0.25 mg, 0.25 mg, Oral, BID, He, Jun, MD, 0.25 mg at 03/12/18 0931 .  digoxin (LANOXIN) tablet 0.25 mg, 0.25 mg, Per Tube, Daily, Paraschos, Alexander, MD, 0.25 mg at 03/12/18 0933 .  docusate (COLACE) 50 MG/5ML liquid 100 mg, 100 mg, Per Tube, BID PRN, Merwyn Katos, MD .  docusate (COLACE) 50 MG/5ML liquid 50 mg, 50 mg, Per Tube, BID, Duanne Limerick, Maged, MD, 50 mg at 03/12/18 0927 .  enoxaparin (LOVENOX) injection 40 mg, 40 mg, Subcutaneous, Q24H, Samaan, Maged, MD, 40 mg at 03/12/18 0931 .  FLUoxetine (PROZAC) 20 MG/5ML solution 20 mg, 20 mg, Oral, Daily, Samaan, Maged, MD, 20 mg at 03/12/18 0929 .  free water 150 mL, 150 mL, Per Tube, Q8H, Annett Fabian, MD, 150 mL at 03/12/18 1346 .  furosemide (LASIX) injection 20 mg, 20 mg, Intravenous, Daily, Samaan, Maged, MD, 20 mg at 03/12/18 0930 .  gabapentin (NEURONTIN) 250 MG/5ML solution 200 mg, 200 mg, Per Tube, Q12H, Annett Fabian, MD, 200 mg at 03/12/18 1610 .  hydrALAZINE (APRESOLINE) injection 10-40 mg, 10-40 mg, Intravenous, Q4H PRN, Annett Fabian, MD .  insulin aspart (novoLOG) injection 0-20 Units, 0-20 Units, Subcutaneous, Q4H, Merwyn Katos, MD, 4 Units at 03/12/18 1342 .  insulin glargine (LANTUS) injection 20 Units, 20 Units, Subcutaneous, QHS, Conforti, John, DO, 20 Units at 03/11/18 2306 .  ipratropium-albuterol (DUONEB) 0.5-2.5 (3) MG/3ML nebulizer solution 3 mL, 3 mL, Nebulization, Q4H PRN, Gouru, Aruna, MD .  ipratropium-albuterol (DUONEB) 0.5-2.5 (3) MG/3ML nebulizer solution 3 mL, 3 mL, Nebulization, TID, Gouru, Aruna, MD, 3 mL at 03/12/18 1304 .  ketoconazole (NIZORAL) 2 % cream, , Topical, Daily PRN, Eugenie Norrie, NP .  lisinopril (PRINIVIL,ZESTRIL) tablet 10 mg, 10 mg, Per Tube, Daily, Annett Fabian, MD, 10 mg at 03/12/18 0932 .  magnesium oxide (MAG-OX) tablet 400 mg, 400 mg, Per Tube, BID, Erin Fulling, MD, 400 mg at 03/12/18 0933 .   MEDLINE mouth rinse, 15 mL, Mouth Rinse, 10 times per day, Merwyn Katos, MD, 15 mL at 03/12/18 1342 .  metoprolol tartrate (LOPRESSOR) tablet 12.5 mg, 12.5 mg, Per Tube, Q6H, Annett Fabian, MD, 12.5 mg at 03/12/18 0133 .  nutrition supplement (JUVEN) (JUVEN) powder packet 1 packet, 1 packet, Per Tube, BID, Erin Fulling, MD, 1 packet at 03/12/18 0930 .  nystatin (MYCOSTATIN) 100000 UNIT/ML suspension 500,000 Units, 5 mL, Oral, QID, Alford Highland, MD, 500,000 Units at 03/12/18 1342 .  oxyCODONE-acetaminophen (PERCOCET/ROXICET) 5-325 MG per tablet 1 tablet, 1 tablet, Per Tube, Q6H PRN, Ihor Austin, MD, 1 tablet at 03/12/18 0932 .  pantoprazole sodium (PROTONIX) 40 mg/20 mL oral suspension 40 mg, 40 mg, Per Tube, Daily, Merwyn Katos, MD, 40 mg at 03/12/18 0933 .  QUEtiapine (SEROQUEL) tablet 200 mg, 200 mg, Per Tube, QHS, Enedina Finner, MD, 200 mg at 03/11/18 2306 .  QUEtiapine (SEROQUEL) tablet 400 mg, 400 mg, Oral, Daily, Enid Baas, MD, 400 mg at 03/12/18 0933 .  sodium chloride flush (NS) 0.9 % injection 10-40 mL, 10-40 mL, Intracatheter, PRN, Merwyn Katos, MD, 30 mL at 03/08/18 2057 .  valproic acid (DEPAKENE) solution 500 mg, 500 mg, Per Tube, BID, Duanne Limerick, Maged, MD, 500 mg at 03/12/18 9604   Allergies:  Patient  has no known allergies.  Review of Systems: Could not provide ROS due to TBI.   Physical Examination:   VS: BP 120/77   Pulse 90   Temp 98.7 F (37.1 C) (Oral)   Resp 18   Ht 5\' 10"  (1.778 m)   Wt 220 lb (99.8 kg)   SpO2 98%   BMI 31.57 kg/m    General Appearance: No distress  Neuro:without focal findings,  speech normal,  HEENT: PERRLA, EOM intact. Pulmonary: Diffuse bilateral rhonchi, R>L.   CardiovascularNormal S1,S2.  No m/r/g.   Abdomen: Benign, Soft, non-tender. Renal:  No costovertebral tenderness  GU:  Not performed at this time. Endoc: No evident thyromegaly, no signs of acromegaly. Skin:   warm, no rash. Extremities: normal, no  cyanosis, clubbing.   LABORATORY PANEL:   CBC No results for input(s): WBC, HGB, HCT, PLT in the last 168 hours. ------------------------------------------------------------------------------------------------------------------  Chemistries  No results for input(s): NA, K, CL, CO2, GLUCOSE, BUN, CREATININE, CALCIUM, MG, AST, ALT, ALKPHOS, BILITOT in the last 168 hours.  Invalid input(s): GFRCGP ------------------------------------------------------------------------------------------------------------------  Cardiac Enzymes Recent Labs  Lab 03/12/18 1016  TROPONINI <0.03   ------------------------------------------------------------  RADIOLOGY:   No results found for this or any previous visit. No results found for this or any previous visit. ------------------------------------------------------------------------------------------------------------------  Thank  you for allowing [facility] to assist in the care of your patient. Our recommendations are noted above.  Please contact us if we can be of further service.   Wells Guiles, MD.  [facility] Office Number: [phone]  Santiago Glad, M.D.  Billy Fischer, M.D  03/12/2018

## 2018-03-13 LAB — GLUCOSE, CAPILLARY
GLUCOSE-CAPILLARY: 109 mg/dL — AB (ref 65–99)
GLUCOSE-CAPILLARY: 94 mg/dL (ref 65–99)
GLUCOSE-CAPILLARY: 145 mg/dL — AB (ref 65–99)
GLUCOSE-CAPILLARY: 126 mg/dL — AB (ref 65–99)
Glucose-Capillary: 116 mg/dL — ABNORMAL HIGH (ref 65–99)
Glucose-Capillary: 160 mg/dL — ABNORMAL HIGH (ref 65–99)

## 2018-03-13 MED ORDER — GLUCERNA 1.5 CAL PO LIQD
1000.0000 mL | ORAL | Status: DC
  Administered 2018-03-13 – 2018-03-16 (×5): 1000 mL

## 2018-03-13 NOTE — Progress Notes (Signed)
[employer] - [place] at [facility]   PATIENT NAME: Tommy PangJames Mentzel    MR#:  [id number]  DATE OF BIRTH:  24-Feb-1961  SUBJECTIVE:  CHIEF COMPLAINT:   Chief Complaint  Patient presents with  . Respiratory Distress  No events overnight, patient did well, and discussion with dietary/nutritionist-we will restart tube feeds at a lower rate and continue close medical monitoring  REVIEW OF SYSTEMS:  CONSTITUTIONAL: No fever, fatigue or weakness.  EYES: No blurred or double vision.  EARS, NOSE, AND THROAT: No tinnitus or ear pain.  RESPIRATORY: No cough, shortness of breath, wheezing or hemoptysis.  CARDIOVASCULAR: No chest pain, orthopnea, edema.  GASTROINTESTINAL: No nausea, vomiting, diarrhea or abdominal pain.  GENITOURINARY: No dysuria, hematuria.  ENDOCRINE: No polyuria, nocturia,  HEMATOLOGY: No anemia, easy bruising or bleeding SKIN: No rash or lesion. MUSCULOSKELETAL: No joint pain or arthritis.   NEUROLOGIC: No tingling, numbness, weakness.  PSYCHIATRY: No anxiety or depression.   ROS  DRUG ALLERGIES:  No Known Allergies  VITALS:  Blood pressure 134/90, pulse 80, temperature 98.8 F (37.1 C), temperature source Oral, resp. rate 18, height 5\' 10"  (1.778 m), weight 99.8 kg (220 lb), SpO2 96 %.  PHYSICAL EXAMINATION:  GENERAL:  57 y.o.-year-old patient lying in the bed with no acute distress.  EYES: Pupils equal, round, reactive to light and accommodation. No scleral icterus. Extraocular muscles intact.  HEENT: Head atraumatic, normocephalic. Oropharynx and nasopharynx clear.  NECK:  Supple, no jugular venous distention. No thyroid enlargement, no tenderness.  LUNGS: Normal breath sounds bilaterally, no wheezing, rales,rhonchi or crepitation. No use of accessory muscles of respiration.  CARDIOVASCULAR: S1, S2 normal. No murmurs, rubs, or gallops.  ABDOMEN: Soft, nontender, nondistended. Bowel sounds present. No organomegaly or mass.  EXTREMITIES: No pedal  edema, cyanosis, or clubbing.  NEUROLOGIC: Cranial nerves II through XII are intact. Muscle strength 5/5 in all extremities. Sensation intact. Gait not checked.  PSYCHIATRIC: The patient is alert and oriented x 3.  SKIN: No obvious rash, lesion, or ulcer.   Physical Exam LABORATORY PANEL:   CBC No results for input(s): WBC, HGB, HCT, PLT in the last 168 hours. ------------------------------------------------------------------------------------------------------------------  Chemistries  No results for input(s): NA, K, CL, CO2, GLUCOSE, BUN, CREATININE, CALCIUM, MG, AST, ALT, ALKPHOS, BILITOT in the last 168 hours.  Invalid input(s): GFRCGP ------------------------------------------------------------------------------------------------------------------  Cardiac Enzymes Recent Labs  Lab 03/12/18 1233 03/12/18 1252  TROPONINI <0.03 <0.03   ------------------------------------------------------------------------------------------------------------------  RADIOLOGY:  Dg Chest 1 View  Result Date: 03/12/2018 CLINICAL DATA:  Hypoxia EXAM: CHEST  1 VIEW COMPARISON:  03/03/2018 FINDINGS: Cardiac shadow is stable. Tracheostomy tube is again seen. Right-sided PICC line is noted in satisfactory position. Previously seen infiltrative changes are again identified but mildly improved when compared with the previous exam. No bony abnormality is noted. IMPRESSION: Persistent but improving infiltrates bilaterally. Electronically Signed   By: Alcide CleverMark  Lukens M.D.   On: 03/12/2018 10:09    ASSESSMENT AND PLAN:  57 year old male with past medical history significant for traumatic brain injury, paroxysmal A. fib, systolic heart failure and diabetes presents to hospital secondary to sepsis and respiratory failure.  *AcuteSevere hypoxic and hypercapnic respiratory failure Resolved Did require ICU care, vent management, s/p tracheostomy this admission, on trach collar, continue mucinex, suctioning as  needed, restart tube feeds, and transfer to SNF that supports trach collar when bed is available  *Acute dysphagia Status post PEG tube placement Continue tube feeds  *AcuteSepsis Resolved secondary to VAP Klebsiella pneumonia Treated with 14-day course  of Ancef-discontinued March 11, 2018  *Acute atypical chest pain  Ruled out for acute coronary syndrome, EKG benign  *Paroxysmal atrial fibrillation Controlled on digoxin and metoprolol not a Candidate for anticoagulation due to recent GI bleed  *Hx of GIB recent negative endoscopy Continue PPI  *History of TBI/depression and schizophrenia Psychiatry did see patient while in-house  Continue valproic acid and Seroquel  *Diabetes mellitus Stable on current regiment   *Chronic stage II decubitus ulcer on buttock- Wound care nurse consulted-continue local wound care    Disposition-social worker working on placement and getting his Medicaidto the right rehab center  All the records are reviewed and case discussed with Care Management/Social Workerr. Management plans discussed with the patient, family and they are in agreement.  CODE STATUS: DNR  TOTAL TIME TAKING CARE OF THIS PATIENT: 35 minutes.     POSSIBLE D/C IN 5 DAYS, DEPENDING ON CLINICAL CONDITION.   [name] M.D on 03/13/2018   Between 7am to 6pm - Pager - [phone]  After 6pm go to [email or web address] - password [email or web address]  [facility] [place] [facility]  Office  [phone]  CC: Primary care physician; Marcina Millard, MD  Note: This dictation was prepared with Dragon dictation along with smaller phrase technology. Any transcriptional errors that result from this process are unintentional.

## 2018-03-13 NOTE — Progress Notes (Signed)
Physical Therapy Treatment Patient Details Name: Tommy Mathews MRN: [id number] DOB: 24-Jan-1961 Today's Date: 03/13/2018    History of Present Illness 57 yo male presented to ER (4/8) secondary to respiratory distress (requiring intubation in ED); admitted with severe sepsis, shock due to multifocal PNA (possible aspiration event), UTI.  Mod amount of coffee-ground material suctioned through OG tube initially; suspected with GIB.  Hospital course further significant for difficulty weaning and persistent ventilator dyssynchrony, requiring administration of vecuronium; afib; PEG placement (4/23), trach placement (4/25); intermittent afib with RVR (controlled with medication as needed).  Required extended time for vent weaning; now tolerating trach collar this date.    PT Comments    Progressed to 60 degree tilt in tilt bed this date; improved midline orientation, overall alertness and active participation/tolerance of UE, LE and core therex.  Vitals stable and WFL on ATC throughout (>95% on 40% FiO2 at 10L) Mother/sister present to observe and encourage/support patient.  Pleased with progress and participation.   Follow Up Recommendations  SNF     Equipment Recommendations       Recommendations for Other Services       Precautions / Restrictions Precautions Precautions: Fall;Other (comment) Precaution Comments: NPO/PEG, trach Restrictions Weight Bearing Restrictions: No    Mobility  Bed Mobility                  Transfers                    Ambulation/Gait                 Stairs             Wheelchair Mobility    Modified Rankin (Stroke Patients Only)       Balance                                            Cognition Arousal/Alertness: Awake/alert Behavior During Therapy: WFL for tasks assessed/performed Overall Cognitive Status: Within Functional Limits for tasks assessed                                        Exercises Other Exercises Other Exercises: Additional training with Vital Go Tilt bed-45 degrees x5 minutes with progression to 60 degrees for additional 10 minutes.  Incorporated bilat LE TKE, mini squats for increased closed-chain activation and muscular endurance; bilat UE reaching, PNF diagnoals and rhythmic/core stabilization activities.  Able to maintain midline orientation without difficulty, very minimal self-correction required this date.  Excellent participation and tolerance for activity this date.    General Comments        Pertinent Vitals/Pain Pain Assessment: Faces Faces Pain Scale: No hurt    Home Living                      Prior Function            PT Goals (current goals can now be found in the care plan section) Acute Rehab PT Goals PT Goal Formulation: With patient Time For Goal Achievement: 03/22/18 Potential to Achieve Goals: Fair Progress towards PT goals: Progressing toward goals    Frequency    Min 2X/week      PT Plan Current plan remains appropriate    Co-evaluation  AM-PAC PT "6 Clicks" Daily Activity  Outcome Measure  Difficulty turning over in bed (including adjusting bedclothes, sheets and blankets)?: Unable Difficulty moving from lying on back to sitting on the side of the bed? : Unable Difficulty sitting down on and standing up from a chair with arms (e.g., wheelchair, bedside commode, etc,.)?: Unable Help needed moving to and from a bed to chair (including a wheelchair)?: Total Help needed walking in hospital room?: Total Help needed climbing 3-5 steps with a railing? : Total 6 Click Score: 6    End of Session Equipment Utilized During Treatment: Oxygen Activity Tolerance: Patient tolerated treatment well Patient left: in bed;with call bell/phone within reach;with bed alarm set Nurse Communication: Mobility status PT Visit Diagnosis: Difficulty in walking, not elsewhere classified  (R26.2);Muscle weakness (generalized) (M62.81) Pain - Right/Left: Left Pain - part of body: Hip     Time: 1610-9604 PT Time Calculation (min) (ACUTE ONLY): 24 min  Charges:  $Therapeutic Exercise: 8-22 mins $Therapeutic Activity: 8-22 mins                    G Codes:      Evaluation/treatment session was lead and completed by Iris Pert, SPT; directly observed, guided and documented by Cephus Slater, PT.  Tommy Mathews. [name], PT, DPT, NCS 03/13/18, 4:57 PM [phone]

## 2018-03-13 NOTE — Progress Notes (Addendum)
  Speech Language Pathology Treatment: Tommy Mathews Speaking valve(family present)  Patient Details Name: Tommy Mathews MRN: [id number] DOB: Aug 11, 1961 Today's Date: 03/13/2018 Time: 1130-1200 SLP Time Calculation (min) (ACUTE ONLY): 30 min  Assessment / Plan / Recommendation Clinical Impression  Pt seen for ongoing assessment and toleration of use of PMV for verbal communication w/ others. Pt was drowsy post recent PT session but awakened briefly to participate in tx session. He tolerated PMV placement for ~10-15 mins w/ no decompensation but remained drowsy so PMV was removed.  Pt requires increased O2 support at 40%; 8 liters at this time for maintaining O2 sats in mid90s. Pt receives frequent tracheal suctioning for management of secretions. PMV would need to be strictly monitored by staff w/ 100% supervision by staff. Education(thorough) w/ family present in room was conducted to inform about the PMV, its use, and precautions. Discussed w/ NSG the use of the PMV w/ SLP and during 100% supervision by NSG/MD at this time d/t pt's increased tracheal secretions requiring scheduled tracheal suctioning and monitoring. NSG agreed, pt/family agreed. ST services will continue to f/u w/ pt's status and use of PMV; readiness for po trials ONCE tracheal secretions are MINIMAL and CONTROLLED.    HPI  Pt is a56 y.o.malew/ h/o DM, GERD, HLD, HTN who presents with respiratory failure with hypoxia. Patient required intubation here in the ED. He also had a large amount of dark coffee-ground type material suctioned from his OG tube. Strong concern for possible GI bleed. Patient is on Xarelto. He presented from a facility with little known medical history, he does have a med list which shows Xarelto on a number of other medications. He is also found to have multifocal pneumonia, hypertension, likely UTI.  Pt is now tolerating trach collar wear w/ cuff deflated but continues to have moderate+ thick tracheal  secretions requiring frequent suctioning to manage. Pt does require increased O2 support currently.       SLP Plan  Continue with current plan of care(PMV use and education)       Recommendations  Diet recommendations: (n/a )      Patient may use Passy-Mathews Speech Valve: with SLP only;Intermittently with supervision(MD, NSG) PMSV Supervision: Full         Oral Care Recommendations: Oral care QID;Staff/trained caregiver to provide oral care Follow up Recommendations: LTACH(TBD) SLP Visit Diagnosis: Aphonia (R49.1)(tracheostomy) Plan: Continue with current plan of care(PMV use and education)       GO                Jerilynn SomKatherine Juan Olthoff, MS, CCC-SLP Tommy Mathews 03/13/2018, 5:18 PM

## 2018-03-13 NOTE — Progress Notes (Signed)
[facility]     Assessment and Plan:  Acute on hypoxic respiratory failure with copious secretions.  Poor airway clearance due to tracheostomy and weakness/debility.  S/p trach and feeding tube.   D/w RN; continue suctioning about every 2 hours today.  Continue nebs q4 followed by suctioning.  Agree with mucomyst.  Advance activity as tolerated (up in chair during day if feasible).   Date: 03/13/2018  MRN# [id number] Tommy Mathews 10-17-1960   Tommy Mathews is a 57 y.o. old male seen in follow up for chief complaint of  Chief Complaint  Patient presents with  . Respiratory Distress     Synopsis The patient is a 57 year old male admitted to the hospital on 01/06/2018 for acute GI bleeding.  He had acute hypoxic respiratory failure at that time and required intubation.  His course was complicated by a severe pneumonia, atrial fibrillation, acute kidney injury, ARDS.  Over the ensuing weeks he improved, however he could not be weaned on the ventilator.  He subsequently underwent a PEG, then trach on 4/25 He was seen by cardiology, noted to have dilated cardiomyopathy, maintained on amiodarone. On 6/12 pulmonary service was reconsulted as patient has been having increasing dyspnea on the floor with copious secretions, requiring frequent suctioning.    Subjective  Patient notes that he is doing better today, breathing appears more comfortable.   chest x-ray 03/12/2018; bilateral chronic infiltrates, unchanged to slightly improved from previous.  MAJOR EVENTS/TEST RESULTS: 04/08 Admission as above 04/08 CT chest: Multifocal severe pneumonia with RUL collapse and soft tissue obstructing RUL bronchus. 04/08 CTAP: Mild small bowel ileus, potential enteritis. Nasogastric tube terminates in distal stomach. Cholelithiasis without CT findings of acute cholecystitis.Consider bronchoscopy.  04/09 nephrology consultation: No acute indication for dialysis at present. 04/09  gastroenterology consultation: Recommend high-dose PPI therapy. 04/10 severely hypoxemic despite 100% FiO2.  Recruitment maneuver with significant improvement.  PEEP increased and ARDS strategy on ventilator implemented.  Neuromuscular blockade implemented due to ventilator dyssynchrony. 04/11 PAF with RVR > converted with amiodarone. Gas exchange improving. Off Nimbex 04/13 High airway pressure with vent dyssynchony; given Vecuronium; FIO2 increased to 50%  4/13 Amiodarone D/C because of SB 4/15 increased HR, EF 20% afib with RVR 4/16 remains intubated 4/17 failed weaning trials, HR remains elevated 4/23 PEG placement 4/25 tracheostomy 6/08 transfer to floor; tolerated trach collar x48 hours 06/12 Desat with copious secretions.  6/13 Doing better with airway clearance.   **Echocardiogram 01/10/2018; EF equals 20%, PA peak pressure equals 40 mmHg.    Medication:    Current Facility-Administered Medications:  .  acetaminophen (TYLENOL) suppository 650 mg, 650 mg, Rectal, Q4H PRN, Samaan, Maged, MD .  acetaminophen (TYLENOL) tablet 650 mg, 650 mg, Per Tube, Q4H PRN, Merwyn KatosSimonds, David B, MD, 650 mg at 03/11/18 1127 .  acetylcysteine (MUCOMYST) 20 % nebulizer / oral solution 4 mL, 4 mL, Nebulization, Q6H PRN, Enid BaasKalisetti, Radhika, MD, 4 mL at 03/10/18 0800 .  acetylcysteine (MUCOMYST) 20 % nebulizer / oral solution 4 mL, 4 mL, Nebulization, BID, Salary, Montell D, MD, 4 mL at 03/13/18 0745 .  bisacodyl (DULCOLAX) suppository 10 mg, 10 mg, Rectal, Daily PRN, Merwyn KatosSimonds, David B, MD, 10 mg at 01/20/18 1048 .  bisacodyl (DULCOLAX) suppository 10 mg, 10 mg, Rectal, Daily PRN, Merwyn KatosSimonds, David B, MD, 10 mg at 02/26/18 1501 .  budesonide (PULMICORT) nebulizer solution 0.5 mg, 0.5 mg, Nebulization, BID, Kasa, Kurian, MD, 0.5 mg at 03/13/18 0745 .  clonazePAM (KLONOPIN) tablet 0.25 mg,  0.25 mg, Oral, BID, He, Jun, MD, 0.25 mg at 03/13/18 1007 .  digoxin (LANOXIN) tablet 0.25 mg, 0.25 mg, Per Tube, Daily,  Paraschos, Alexander, MD, 0.25 mg at 03/13/18 1007 .  docusate (COLACE) 50 MG/5ML liquid 100 mg, 100 mg, Per Tube, BID PRN, Merwyn Katos, MD .  docusate (COLACE) 50 MG/5ML liquid 50 mg, 50 mg, Per Tube, BID, Samaan, Maged, MD, 50 mg at 03/13/18 1006 .  enoxaparin (LOVENOX) injection 40 mg, 40 mg, Subcutaneous, Q24H, Samaan, Maged, MD, 40 mg at 03/13/18 1006 .  feeding supplement (GLUCERNA 1.5 CAL) liquid 1,000 mL, 1,000 mL, Per Tube, Continuous, Salary, Montell D, MD, Last Rate: 70 mL/hr at 03/13/18 1009, 1,000 mL at 03/13/18 1009 .  FLUoxetine (PROZAC) 20 MG/5ML solution 20 mg, 20 mg, Oral, Daily, Samaan, Maged, MD, 20 mg at 03/13/18 1006 .  free water 150 mL, 150 mL, Per Tube, Q8H, Annett Fabian, MD, 150 mL at 03/13/18 0534 .  furosemide (LASIX) injection 20 mg, 20 mg, Intravenous, Daily, Samaan, Maged, MD, 20 mg at 03/13/18 1007 .  gabapentin (NEURONTIN) 250 MG/5ML solution 200 mg, 200 mg, Per Tube, Q12H, Annett Fabian, MD, 200 mg at 03/13/18 1015 .  hydrALAZINE (APRESOLINE) injection 10-40 mg, 10-40 mg, Intravenous, Q4H PRN, Annett Fabian, MD .  insulin aspart (novoLOG) injection 0-20 Units, 0-20 Units, Subcutaneous, Q4H, Merwyn Katos, MD, 4 Units at 03/12/18 2124 .  insulin glargine (LANTUS) injection 20 Units, 20 Units, Subcutaneous, QHS, Conforti, John, DO, 20 Units at 03/12/18 2123 .  ipratropium-albuterol (DUONEB) 0.5-2.5 (3) MG/3ML nebulizer solution 3 mL, 3 mL, Nebulization, Q4H PRN, Gouru, Aruna, MD .  ipratropium-albuterol (DUONEB) 0.5-2.5 (3) MG/3ML nebulizer solution 3 mL, 3 mL, Nebulization, Q4H, Nicholos Johns, Justeen Hehr, MD, 3 mL at 03/13/18 1137 .  ketoconazole (NIZORAL) 2 % cream, , Topical, Daily PRN, Eugenie Norrie, NP .  lisinopril (PRINIVIL,ZESTRIL) tablet 10 mg, 10 mg, Per Tube, Daily, Annett Fabian, MD, 10 mg at 03/13/18 1007 .  magnesium oxide (MAG-OX) tablet 400 mg, 400 mg, Per Tube, BID, Erin Fulling, MD, 400 mg at 03/13/18 1007 .  MEDLINE mouth  rinse, 15 mL, Mouth Rinse, 10 times per day, Merwyn Katos, MD, 15 mL at 03/13/18 1006 .  metoprolol tartrate (LOPRESSOR) tablet 12.5 mg, 12.5 mg, Per Tube, Q6H, Annett Fabian, MD, 12.5 mg at 03/13/18 1007 .  nutrition supplement (JUVEN) (JUVEN) powder packet 1 packet, 1 packet, Per Tube, BID, Erin Fulling, MD, 1 packet at 03/13/18 1009 .  nystatin (MYCOSTATIN) 100000 UNIT/ML suspension 500,000 Units, 5 mL, Oral, QID, Alford Highland, MD, 500,000 Units at 03/13/18 1007 .  oxyCODONE-acetaminophen (PERCOCET/ROXICET) 5-325 MG per tablet 1 tablet, 1 tablet, Per Tube, Q6H PRN, Ihor Austin, MD, 1 tablet at 03/12/18 0932 .  pantoprazole sodium (PROTONIX) 40 mg/20 mL oral suspension 40 mg, 40 mg, Per Tube, Daily, Merwyn Katos, MD, 40 mg at 03/13/18 1006 .  QUEtiapine (SEROQUEL) tablet 200 mg, 200 mg, Per Tube, QHS, Enedina Finner, MD, 200 mg at 03/12/18 2119 .  QUEtiapine (SEROQUEL) tablet 400 mg, 400 mg, Oral, Daily, Nemiah Commander, Radhika, MD, 400 mg at 03/13/18 1007 .  sodium chloride flush (NS) 0.9 % injection 10-40 mL, 10-40 mL, Intracatheter, PRN, Merwyn Katos, MD, 30 mL at 03/08/18 2057 .  valproic acid (DEPAKENE) solution 500 mg, 500 mg, Per Tube, BID, Samaan, Maged, MD, 500 mg at 03/13/18 1006   Allergies:  Patient has no known allergies.  Review of Systems: Could  not provide ROS due to TBI.   Physical Examination:   VS: BP 134/90 (BP Location: Left Arm)   Pulse 80   Temp 98.8 F (37.1 C) (Oral)   Resp 18   Ht 5\' 10"  (1.778 m)   Wt 220 lb (99.8 kg)   SpO2 96%   BMI 31.57 kg/m    General Appearance: No distress, feels better.   Neuro:without focal findings,  speech normal,  HEENT: PERRLA, EOM intact. Pulmonary: scattered bilateral rhonchi, R>L.   CardiovascularNormal S1,S2.  No m/r/g.   Abdomen: Benign, Soft, non-tender. Renal:  No costovertebral tenderness  GU:  Not performed at this time. Endoc: No evident thyromegaly, no signs of acromegaly. Skin:   warm, no  rash. Extremities: normal, no cyanosis, clubbing.   LABORATORY PANEL:   CBC No results for input(s): WBC, HGB, HCT, PLT in the last 168 hours. ------------------------------------------------------------------------------------------------------------------  Chemistries  No results for input(s): NA, K, CL, CO2, GLUCOSE, BUN, CREATININE, CALCIUM, MG, AST, ALT, ALKPHOS, BILITOT in the last 168 hours.  Invalid input(s): GFRCGP ------------------------------------------------------------------------------------------------------------------  Cardiac Enzymes Recent Labs  Lab 03/12/18 1252  TROPONINI <0.03   ------------------------------------------------------------  RADIOLOGY:   No results found for this or any previous visit. No results found for this or any previous visit. ------------------------------------------------------------------------------------------------------------------  Thank  you for allowing [facility] to assist in the care of your patient. Our recommendations are noted above.  Please contact us if we can be of further service.   Wells Guiles, MD.  [facility] Office Number: [phone]  Santiago Glad, M.D.  Billy Fischer, M.D  03/13/2018

## 2018-03-14 LAB — GLUCOSE, CAPILLARY
GLUCOSE-CAPILLARY: 119 mg/dL — AB (ref 65–99)
Glucose-Capillary: 119 mg/dL — ABNORMAL HIGH (ref 65–99)
Glucose-Capillary: 134 mg/dL — ABNORMAL HIGH (ref 65–99)
Glucose-Capillary: 141 mg/dL — ABNORMAL HIGH (ref 65–99)
Glucose-Capillary: 146 mg/dL — ABNORMAL HIGH (ref 65–99)
Glucose-Capillary: 147 mg/dL — ABNORMAL HIGH (ref 65–99)
Glucose-Capillary: 163 mg/dL — ABNORMAL HIGH (ref 65–99)

## 2018-03-14 LAB — CBC
HEMATOCRIT: 35.5 % — AB (ref 40.0–52.0)
Hemoglobin: 11.3 g/dL — ABNORMAL LOW (ref 13.0–18.0)
MCH: 27.2 pg (ref 26.0–34.0)
MCHC: 31.8 g/dL — AB (ref 32.0–36.0)
MCV: 85.5 fL (ref 80.0–100.0)
Platelets: 304 10*3/uL (ref 150–440)
RBC: 4.15 MIL/uL — ABNORMAL LOW (ref 4.40–5.90)
RDW: 19.5 % — AB (ref 11.5–14.5)
WBC: 10.7 10*3/uL — ABNORMAL HIGH (ref 3.8–10.6)

## 2018-03-14 NOTE — Progress Notes (Signed)
[employer] [facility] [place] [facility]   PATIENT NAME: Tommy Mathews    MR#:  [id number]  DATE OF BIRTH:  09/10/1961  SUBJECTIVE:  CHIEF COMPLAINT:   Chief Complaint  Patient presents with  . Respiratory Distress  No events overnight, patient has done well, awaiting placement  REVIEW OF SYSTEMS:  CONSTITUTIONAL: No fever, fatigue or weakness.  EYES: No blurred or double vision.  EARS, NOSE, AND THROAT: No tinnitus or ear pain.  RESPIRATORY: No cough, shortness of breath, wheezing or hemoptysis.  CARDIOVASCULAR: No chest pain, orthopnea, edema.  GASTROINTESTINAL: No nausea, vomiting, diarrhea or abdominal pain.  GENITOURINARY: No dysuria, hematuria.  ENDOCRINE: No polyuria, nocturia,  HEMATOLOGY: No anemia, easy bruising or bleeding SKIN: No rash or lesion. MUSCULOSKELETAL: No joint pain or arthritis.   NEUROLOGIC: No tingling, numbness, weakness.  PSYCHIATRY: No anxiety or depression.   ROS  DRUG ALLERGIES:  No Known Allergies  VITALS:  Blood pressure 133/86, pulse 87, temperature 98.6 F (37 C), temperature source Oral, resp. rate 18, height 5\' 10"  (1.778 m), weight 95.7 kg (211 lb), SpO2 95 %.  PHYSICAL EXAMINATION:  GENERAL:  57 y.o.-year-old patient lying in the bed with no acute distress.  EYES: Pupils equal, round, reactive to light and accommodation. No scleral icterus. Extraocular muscles intact.  HEENT: Head atraumatic, normocephalic. Oropharynx and nasopharynx clear.  NECK:  Supple, no jugular venous distention. No thyroid enlargement, no tenderness.  LUNGS: Normal breath sounds bilaterally, no wheezing, rales,rhonchi or crepitation. No use of accessory muscles of respiration.  CARDIOVASCULAR: S1, S2 normal. No murmurs, rubs, or gallops.  ABDOMEN: Soft, nontender, nondistended. Bowel sounds present. No organomegaly or mass.  EXTREMITIES: No pedal edema, cyanosis, or clubbing.  NEUROLOGIC: Cranial nerves II through XII are intact. Muscle strength  5/5 in all extremities. Sensation intact. Gait not checked.  PSYCHIATRIC: The patient is alert and oriented x 3.  SKIN: No obvious rash, lesion, or ulcer.   Physical Exam LABORATORY PANEL:   CBC Recent Labs  Lab 03/14/18 0500  WBC 10.7*  HGB 11.3*  HCT 35.5*  PLT 304   ------------------------------------------------------------------------------------------------------------------  Chemistries  No results for input(s): NA, K, CL, CO2, GLUCOSE, BUN, CREATININE, CALCIUM, MG, AST, ALT, ALKPHOS, BILITOT in the last 168 hours.  Invalid input(s): GFRCGP ------------------------------------------------------------------------------------------------------------------  Cardiac Enzymes Recent Labs  Lab 03/12/18 1233 03/12/18 1252  TROPONINI <0.03 <0.03   ------------------------------------------------------------------------------------------------------------------  RADIOLOGY:  No results found.  ASSESSMENT AND PLAN:  57 year old male with past medical history significant for traumatic brain injury, paroxysmal A. fib, systolic heart failure and diabetes presents to hospital secondary to sepsis and respiratory failure.  *AcuteSevere hypoxic and hypercapnic respiratory failure Resolved Did require ICU care w/ vent management, s/p tracheostomythis admission,on trach collar,pulmonology input greatly appreciated, breathing treatments as needed, mucinex, suctioning as needed, continue TFs,  aspiration/fall precautions, and transfer to SNF that supports trach collar when bed is available  *Acute dysphagia Status post PEG tube placement Continue tube feeds  *AcuteSepsis Resolved secondary to VAP Klebsiella pneumonia Treated with 14-day course of Ancef-discontinued March 11, 2018  *Acute atypical chest pain  Ruled out for acute coronary syndrome, EKG benign  *Paroxysmal atrial fibrillation Controlled ondigoxin and metoprolol not a Candidate for anticoagulation due  to recent GI bleed  *Hx of GIB recent negative endoscopy Continue PPI  *History of TBI/depression and schizophrenia Psychiatry did see patient while in-house Continue valproic acidandSeroquel  *Diabetes mellitus Stable oncurrent regiment  *Chronic stageII decubitus ulcer on buttock- Wound care nurse consulted-continue  local wound care   Disposition-social worker working on placement and getting his Medicaidto the right rehab center   All the records are reviewed and case discussed with Care Management/Social Workerr. Management plans discussed with the patient, family and they are in agreement.  CODE STATUS: DNR  TOTAL TIME TAKING CARE OF THIS PATIENT: 35 minutes.     POSSIBLE D/C IN 1 DAYS, DEPENDING ON CLINICAL CONDITION.   Evelena AsaMontell D Joellyn Grandt M.D on 03/14/2018   Between 7am to 6pm - Pager - [phone]  After 6pm go to [email or web address] - password [email or web address]  [facility]  Office  [phone]  CC: Primary care physician; Marcina MillardParaschos, Alexander, MD  Note: This dictation was prepared with Dragon dictation along with smaller phrase technology. Any transcriptional errors that result from this process are unintentional.

## 2018-03-14 NOTE — Progress Notes (Signed)
Occupational Therapy Treatment Patient Details Name: Allah Reason MRN: [id number] DOB: 1961-03-21 Today's Date: 03/14/2018    History of present illness 57 yo male presented to ER (4/8) secondary to respiratory distress (requiring intubation in ED); admitted with severe sepsis, shock due to multifocal PNA (possible aspiration event), UTI.  Mod amount of coffee-ground material suctioned through OG tube initially; suspected with GIB.  Hospital course further significant for difficulty weaning and persistent ventilator dyssynchrony, requiring administration of vecuronium; afib; PEG placement (4/23), trach placement (4/25); intermittent afib with RVR (controlled with medication as needed).  Required extended time for vent weaning; now tolerating trach collar this date.   OT comments  Met pt lying in bed, just having had suction from RT and agreeable to OT this date. SLP contacted to trial passy muir during session, pt tolerated well and able to communicate with therapist. Tilt bed used as therapeutic tool to progress pt in functional mobility. Pt completed x10 ceiling punches while at 60 degrees tilt, fatiguing quickly. Pt also perseverating on doing small squats (learned from last PT session) and needing frequent VC's to attend to activity. Pt then completed x10 shoulder abduction exercises, some difficulty following commands. Pt washed face with set up A and encouraged reaching while tilted at 70 degrees. Pt needing frequent VC's to take deep breaths and cough with passy muir valve on. Pt with productive cough and spit x1, coughing continued at end of session with sats hovering below 88. Tilt removed, pt placed in chair position. SLP removed passy muir and sats recovered.   Follow Up Recommendations  SNF    Equipment Recommendations  Other (comment)(TBD)    Recommendations for Other Services      Precautions / Restrictions Precautions Precautions: Fall;Other (comment) Precaution Comments:  BPO/PEG, trach Restrictions Weight Bearing Restrictions: No       Mobility Bed Mobility                  Transfers                 General transfer comment: tilt bed option used this date    Balance                                           ADL either performed or assessed with clinical judgement   ADL Overall ADL's : Needs assistance/impaired Eating/Feeding: NPO Eating/Feeding Details (indicate cue type and reason): PEG tube   Grooming Details (indicate cue type and reason): Pt in tilt bed at 70 degrees, washed face with set up A, pt desat and fatigued quickly                               General ADL Comments: slowly progressing ADLs while in tilt bed     Vision Baseline Vision/History: (unable to verbalize) Patient Visual Report: (unable to verbalize)     Perception     Praxis      Cognition Arousal/Alertness: Awake/alert Behavior During Therapy: WFL for tasks assessed/performed Overall Cognitive Status: Within Functional Limits for tasks assessed                                 General Comments: pt able to verbalize this tx with passy muir, easily distracted by TV, consistent VC's  needed, able to further assess cognition with passy trials        Exercises     Shoulder Instructions       General Comments      Pertinent Vitals/ Pain       Pain Assessment: No/denies pain Pain Location: Pt with passy muir valve in, verbally reports no pain other than mild pain in knees  Home Living                                          Prior Functioning/Environment              Frequency  Min 2X/week        Progress Toward Goals  OT Goals(current goals can now be found in the care plan section)  Progress towards OT goals: Progressing toward goals  Acute Rehab OT Goals OT Goal Formulation: With patient Time For Goal Achievement: 03/18/18 Potential to Achieve Goals: Good   Plan Discharge plan remains appropriate;Frequency remains appropriate    Co-evaluation                 AM-PAC PT "6 Clicks" Daily Activity     Outcome Measure   Help from another person eating meals?: Total Help from another person taking care of personal grooming?: A Little Help from another person toileting, which includes using toliet, bedpan, or urinal?: Total Help from another person bathing (including washing, rinsing, drying)?: A Lot Help from another person to put on and taking off regular upper body clothing?: A Lot Help from another person to put on and taking off regular lower body clothing?: A Lot 6 Click Score: 11    End of Session Equipment Utilized During Treatment: Other (comment)(tilt bed)  OT Visit Diagnosis: Other abnormalities of gait and mobility (R26.89);Muscle weakness (generalized) (M62.81);Other symptoms and signs involving cognitive function   Activity Tolerance Patient tolerated treatment well   Patient Left in bed;with call bell/phone within reach;with bed alarm set   Nurse Communication          Time: 4944-9675 OT Time Calculation (min): 40 min  Charges: OT General Charges $OT Visit: 1 Visit OT Treatments $Self Care/Home Management : 23-37 mins   Zenovia Jarred, MSOT, OTR/L  [place] 03/14/2018, 5:21 PM

## 2018-03-14 NOTE — Progress Notes (Signed)
[facility] [place] [facility]     Assessment and Plan:  Acute on hypoxic respiratory failure with copious secretions and atelectasis.  Poor airway clearance due to tracheostomy and weakness/debility, appears improved today.  S/p trach and feeding tube.   Continue nebs q4 followed by suctioning.  Continue Mucomyst.  Advance activity as tolerated (up in chair during day if feasible).  Wean down oxygen as tolerated.  [name] valve as tolerated. Could consider decannulation once his secretion/suctioning requirements are reduced as well as his oxygen requirements.  Date: 03/14/2018  MRN# [id number] Tommy Mathews March 09, 1961   Tommy Mathews is a 57 y.o. old male seen in follow up for chief complaint of  Chief Complaint  Patient presents with  . Respiratory Distress     Synopsis The patient is a 57 year old male admitted to the hospital on 01/06/2018 for acute GI bleeding.  He had acute hypoxic respiratory failure at that time and required intubation.  His course was complicated by a severe pneumonia, atrial fibrillation, acute kidney injury, ARDS.  Over the ensuing weeks he improved, however he could not be weaned on the ventilator.  He subsequently underwent a PEG, then trach on 4/25 He was seen by cardiology, noted to have dilated cardiomyopathy, maintained on amiodarone. On 6/12 pulmonary service was reconsulted as patient has been having increasing dyspnea on the floor with copious secretions, requiring frequent suctioning.    Subjective  Patient notes that he is doing better today, breathing appears more comfortable.   chest x-ray 03/12/2018; bilateral chronic infiltrates, unchanged to slightly improved from previous.  MAJOR EVENTS/TEST RESULTS: 04/08 Admission as above 04/08 CT chest: Multifocal severe pneumonia with RUL collapse and soft tissue obstructing RUL bronchus. 04/08 CTAP: Mild small bowel ileus, potential enteritis. Nasogastric tube terminates in distal stomach.  Cholelithiasis without CT findings of acute cholecystitis.Consider bronchoscopy.  04/09 nephrology consultation: No acute indication for dialysis at present. 04/09 gastroenterology consultation: Recommend high-dose PPI therapy. 04/10 severely hypoxemic despite 100% FiO2.  Recruitment maneuver with significant improvement.  PEEP increased and ARDS strategy on ventilator implemented.  Neuromuscular blockade implemented due to ventilator dyssynchrony. 04/11 PAF with RVR > converted with amiodarone. Gas exchange improving. Off Nimbex 04/13 High airway pressure with vent dyssynchony; given Vecuronium; FIO2 increased to 50%  4/13 Amiodarone D/C because of SB 4/15 increased HR, EF 20% afib with RVR 4/16 remains intubated 4/17 failed weaning trials, HR remains elevated 4/23 PEG placement 4/25 tracheostomy 6/08 transfer to floor; tolerated trach collar x48 hours 06/12 Desat with copious secretions.  6/13 Doing better with airway clearance.   **Echocardiogram 01/10/2018; EF equals 20%, PA peak pressure equals 40 mmHg.    Medication:    Current Facility-Administered Medications:  .  acetaminophen (TYLENOL) suppository 650 mg, 650 mg, Rectal, Q4H PRN, Samaan, Maged, MD .  acetaminophen (TYLENOL) tablet 650 mg, 650 mg, Per Tube, Q4H PRN, Merwyn Katos, MD, 650 mg at 03/14/18 0834 .  acetylcysteine (MUCOMYST) 20 % nebulizer / oral solution 4 mL, 4 mL, Nebulization, Q6H PRN, Enid Baas, MD, 4 mL at 03/10/18 0800 .  acetylcysteine (MUCOMYST) 20 % nebulizer / oral solution 4 mL, 4 mL, Nebulization, BID, Salary, Montell D, MD, 4 mL at 03/14/18 0732 .  bisacodyl (DULCOLAX) suppository 10 mg, 10 mg, Rectal, Daily PRN, Merwyn Katos, MD, 10 mg at 01/20/18 1048 .  bisacodyl (DULCOLAX) suppository 10 mg, 10 mg, Rectal, Daily PRN, Merwyn Katos, MD, 10 mg at 02/26/18 1501 .  budesonide (PULMICORT) nebulizer solution 0.5 mg,  0.5 mg, Nebulization, BID, Belia HemanKasa, Kurian, MD, 0.5 mg at 03/14/18 0731 .   clonazePAM (KLONOPIN) tablet 0.25 mg, 0.25 mg, Oral, BID, He, Jun, MD, 0.25 mg at 03/14/18 0834 .  digoxin (LANOXIN) tablet 0.25 mg, 0.25 mg, Per Tube, Daily, Paraschos, Alexander, MD, 0.25 mg at 03/14/18 0831 .  docusate (COLACE) 50 MG/5ML liquid 100 mg, 100 mg, Per Tube, BID PRN, Merwyn KatosSimonds, David B, MD .  docusate (COLACE) 50 MG/5ML liquid 50 mg, 50 mg, Per Tube, BID, Duanne LimerickSamaan, Maged, MD, 50 mg at 03/14/18 0834 .  enoxaparin (LOVENOX) injection 40 mg, 40 mg, Subcutaneous, Q24H, Samaan, Maged, MD, 40 mg at 03/14/18 0832 .  feeding supplement (GLUCERNA 1.5 CAL) liquid 1,000 mL, 1,000 mL, Per Tube, Continuous, Salary, Montell D, MD, Last Rate: 70 mL/hr at 03/14/18 0830, 1,000 mL at 03/14/18 0830 .  FLUoxetine (PROZAC) 20 MG/5ML solution 20 mg, 20 mg, Oral, Daily, Samaan, Maged, MD, 20 mg at 03/14/18 0831 .  free water 150 mL, 150 mL, Per Tube, Q8H, Annett Fabianichards, Karol A, MD, 150 mL at 03/14/18 0542 .  furosemide (LASIX) injection 20 mg, 20 mg, Intravenous, Daily, Samaan, Maged, MD, 20 mg at 03/14/18 0834 .  gabapentin (NEURONTIN) 250 MG/5ML solution 200 mg, 200 mg, Per Tube, Q12H, Annett Fabianichards, Karol A, MD, 200 mg at 03/14/18 0831 .  hydrALAZINE (APRESOLINE) injection 10-40 mg, 10-40 mg, Intravenous, Q4H PRN, Jackson Latinoichards, Karol A, MD .  insulin aspart (novoLOG) injection 0-20 Units, 0-20 Units, Subcutaneous, Q4H, Merwyn KatosSimonds, David B, MD, 3 Units at 03/14/18 1219 .  insulin glargine (LANTUS) injection 20 Units, 20 Units, Subcutaneous, QHS, Conforti, John, DO, 20 Units at 03/13/18 2055 .  ipratropium-albuterol (DUONEB) 0.5-2.5 (3) MG/3ML nebulizer solution 3 mL, 3 mL, Nebulization, Q4H PRN, Gouru, Aruna, MD .  ipratropium-albuterol (DUONEB) 0.5-2.5 (3) MG/3ML nebulizer solution 3 mL, 3 mL, Nebulization, Q4H, Nicholos Johnsamachandran, Janesa Dockery, MD, 3 mL at 03/14/18 1236 .  ketoconazole (NIZORAL) 2 % cream, , Topical, Daily PRN, Eugenie NorrieBlakeney, Dana G, NP .  lisinopril (PRINIVIL,ZESTRIL) tablet 10 mg, 10 mg, Per Tube, Daily, Annett Fabianichards, Karol  A, MD, 10 mg at 03/14/18 0834 .  magnesium oxide (MAG-OX) tablet 400 mg, 400 mg, Per Tube, BID, Erin FullingKasa, Kurian, MD, 400 mg at 03/14/18 0834 .  MEDLINE mouth rinse, 15 mL, Mouth Rinse, 10 times per day, Merwyn KatosSimonds, David B, MD, 15 mL at 03/14/18 [id number] .  metoprolol tartrate (LOPRESSOR) tablet 12.5 mg, 12.5 mg, Per Tube, Q6H, Annett Fabianichards, Karol A, MD, 12.5 mg at 03/14/18 0835 .  nutrition supplement (JUVEN) (JUVEN) powder packet 1 packet, 1 packet, Per Tube, BID, Erin FullingKasa, Kurian, MD, 1 packet at 03/14/18 0832 .  nystatin (MYCOSTATIN) 100000 UNIT/ML suspension 500,000 Units, 5 mL, Oral, QID, Alford HighlandWieting, Richard, MD, 500,000 Units at 03/14/18 0831 .  oxyCODONE-acetaminophen (PERCOCET/ROXICET) 5-325 MG per tablet 1 tablet, 1 tablet, Per Tube, Q6H PRN, Ihor AustinPyreddy, Pavan, MD, 1 tablet at 03/14/18 0542 .  pantoprazole sodium (PROTONIX) 40 mg/20 mL oral suspension 40 mg, 40 mg, Per Tube, Daily, Merwyn KatosSimonds, David B, MD, 40 mg at 03/14/18 [id number] .  QUEtiapine (SEROQUEL) tablet 200 mg, 200 mg, Per Tube, QHS, Enedina FinnerPatel, Sona, MD, 200 mg at 03/13/18 2056 .  QUEtiapine (SEROQUEL) tablet 400 mg, 400 mg, Oral, Daily, Enid BaasKalisetti, Radhika, MD, 400 mg at 03/14/18 0831 .  sodium chloride flush (NS) 0.9 % injection 10-40 mL, 10-40 mL, Intracatheter, PRN, Merwyn KatosSimonds, David B, MD, 30 mL at 03/08/18 2057 .  valproic acid (DEPAKENE) solution 500 mg, 500 mg, Per Tube, BID, Uvaldo RisingSamaan, Maged, MD,  500 mg at 03/14/18 1478   Allergies:  Patient has no known allergies.  Review of Systems: Could not provide ROS due to TBI.   Physical Examination:   VS: BP 133/86 (BP Location: Left Arm)   Pulse 87   Temp 98.6 F (37 C) (Oral)   Resp 18   Ht 5\' 10"  (1.778 m)   Wt 211 lb (95.7 kg)   SpO2 95%   BMI 30.28 kg/m    General Appearance: No distress, feels better.   Neuro:without focal findings, nonverbal HEENT: PERRLA, EOM intact.  Tracheostomy in place Pulmonary: Continued scattered bilateral rhonchi, R>L.   CardiovascularNormal S1,S2.  No m/r/g.     Abdomen: Benign, Soft, non-tender. Renal:  No costovertebral tenderness  GU:  Not performed at this time. Endoc: No evident thyromegaly, no signs of acromegaly. Skin:   warm, no rash. Extremities: normal, no cyanosis, clubbing.   LABORATORY PANEL:   CBC Recent Labs  Lab 03/14/18 0500  WBC 10.7*  HGB 11.3*  HCT 35.5*  PLT 304   ------------------------------------------------------------------------------------------------------------------  Chemistries  No results for input(s): NA, K, CL, CO2, GLUCOSE, BUN, CREATININE, CALCIUM, MG, AST, ALT, ALKPHOS, BILITOT in the last 168 hours.  Invalid input(s): GFRCGP ------------------------------------------------------------------------------------------------------------------  Cardiac Enzymes Recent Labs  Lab 03/12/18 1252  TROPONINI <0.03   ------------------------------------------------------------  RADIOLOGY:   No results found for this or any previous visit. No results found for this or any previous visit. ------------------------------------------------------------------------------------------------------------------  Thank  you for allowing [facility] to assist in the care of your patient. Our recommendations are noted above.  Please contact us if we can be of further service.   Wells Guiles, MD.  [place] [facility] Office Number: [phone]  Santiago Glad, M.D.  Billy Fischer, M.D  03/14/2018

## 2018-03-14 NOTE — Clinical Social Work Note (Signed)
Patient remains on the difficult to place list and has no SNF bed offers at this time. CSW will continue to follow for discharge planning.    Tommy Mathews MSW, [address] Archer RdCSWA [phone]

## 2018-03-14 NOTE — Progress Notes (Signed)
Pt. Suctioned for moderate amt of thick yellow secretions post neb tx.Pt. Tolerated well.

## 2018-03-15 LAB — GLUCOSE, CAPILLARY
GLUCOSE-CAPILLARY: 118 mg/dL — AB (ref 65–99)
Glucose-Capillary: 114 mg/dL — ABNORMAL HIGH (ref 65–99)
Glucose-Capillary: 150 mg/dL — ABNORMAL HIGH (ref 65–99)
Glucose-Capillary: 139 mg/dL — ABNORMAL HIGH (ref 65–99)
Glucose-Capillary: 135 mg/dL — ABNORMAL HIGH (ref 65–99)

## 2018-03-15 NOTE — Progress Notes (Signed)
Physical Therapy Treatment Patient Details Name: Mathis Cashman MRN: [id number] DOB: 06-01-1961 Today's Date: 03/15/2018    History of Present Illness 57 yo male presented to ER (4/8) secondary to respiratory distress (requiring intubation in ED); admitted with severe sepsis, shock due to multifocal PNA (possible aspiration event), UTI.  Mod amount of coffee-ground material suctioned through OG tube initially; suspected with GIB.  Hospital course further significant for difficulty weaning and persistent ventilator dyssynchrony, requiring administration of vecuronium; afib; PEG placement (4/23), trach placement (4/25); intermittent afib with RVR (controlled with medication as needed).  Required extended time for vent weaning; now tolerating trach collar this date.    PT Comments    Pt awake and ready for session.  Tolerated Vital lift well today and was able to progress from 60-70 degrees today for a total of 10 minutes and articipated in exercises as described below.  Tube feeding was paused by RN during session for bed to be transitioned in flat position.  Overall tolerated well.    Follow Up Recommendations  SNF     Equipment Recommendations       Recommendations for Other Services       Precautions / Restrictions Precautions Precautions: Fall;Other (comment) Precaution Comments: BPO/PEG, trach Restrictions Weight Bearing Restrictions: No    Mobility  Bed Mobility                  Transfers                    Ambulation/Gait                 Stairs             Wheelchair Mobility    Modified Rankin (Stroke Patients Only)       Balance                                            Cognition Arousal/Alertness: Awake/alert Behavior During Therapy: WFL for tasks assessed/performed Overall Cognitive Status: Within Functional Limits for tasks assessed                                 General Comments: passy  muir not in during session today but was able to communicate well during session.      Exercises Other Exercises Other Exercises: VitalGo tilt bed.  Pt was able to progress to 60 degrees for 3 minutes, 70 degrees for 7 minutes with mini-squats with focus on terminal knee ext, head and neck posture and UE exercise.  Fatigued with trials today but overall tolerates well.    General Comments        Pertinent Vitals/Pain Pain Assessment: No/denies pain Pain Intervention(s): Monitored during session    Home Living                      Prior Function            PT Goals (current goals can now be found in the care plan section) Progress towards PT goals: Progressing toward goals    Frequency    Min 2X/week      PT Plan Current plan remains appropriate    Co-evaluation              AM-PAC PT "6 Clicks" Daily Activity  Outcome Measure  Difficulty turning over in bed (including adjusting bedclothes, sheets and blankets)?: Unable Difficulty moving from lying on back to sitting on the side of the bed? : Unable Difficulty sitting down on and standing up from a chair with arms (e.g., wheelchair, bedside commode, etc,.)?: Unable Help needed moving to and from a bed to chair (including a wheelchair)?: Total Help needed walking in hospital room?: Total Help needed climbing 3-5 steps with a railing? : Total 6 Click Score: 6    End of Session Equipment Utilized During Treatment: Oxygen Activity Tolerance: Patient tolerated treatment well Patient left: in bed;with call bell/phone within reach;with bed alarm set Nurse Communication: Other (comment) Pain - Right/Left: Left Pain - part of body: Hip     Time: [id number] PT Time Calculation (min) (ACUTE ONLY): 16 min  Charges:  $Therapeutic Exercise: 8-22 mins                    G Codes:      Danielle DessSarah Creighton Longley, PTA 03/15/18, 12:18 PM

## 2018-03-15 NOTE — Progress Notes (Signed)
[employer] [facility] [place] [facility]   PATIENT NAME: Tommy Mathews    MR#:  [id number]  DATE OF BIRTH:  08-02-1961  SUBJECTIVE:  CHIEF COMPLAINT:   Chief Complaint  Patient presents with  . Respiratory Distress  No events overnight per nursing staff, patient without complaint, awaiting placement  REVIEW OF SYSTEMS:  CONSTITUTIONAL: No fever, fatigue or weakness.  EYES: No blurred or double vision.  EARS, NOSE, AND THROAT: No tinnitus or ear pain.  RESPIRATORY: No cough, shortness of breath, wheezing or hemoptysis.  CARDIOVASCULAR: No chest pain, orthopnea, edema.  GASTROINTESTINAL: No nausea, vomiting, diarrhea or abdominal pain.  GENITOURINARY: No dysuria, hematuria.  ENDOCRINE: No polyuria, nocturia,  HEMATOLOGY: No anemia, easy bruising or bleeding SKIN: No rash or lesion. MUSCULOSKELETAL: No joint pain or arthritis.   NEUROLOGIC: No tingling, numbness, weakness.  PSYCHIATRY: No anxiety or depression.   ROS  DRUG ALLERGIES:  No Known Allergies  VITALS:  Blood pressure 131/71, pulse (!) 104, temperature 98.6 F (37 C), temperature source Oral, resp. rate (!) 21, height 5\' 10"  (1.778 m), weight 95.7 kg (211 lb), SpO2 96 %.  PHYSICAL EXAMINATION:  GENERAL:  57 y.o.-year-old patient lying in the bed with no acute distress.  EYES: Pupils equal, round, reactive to light and accommodation. No scleral icterus. Extraocular muscles intact.  HEENT: Head atraumatic, normocephalic. Oropharynx and nasopharynx clear.  NECK:  Supple, no jugular venous distention. No thyroid enlargement, no tenderness.  LUNGS: Normal breath sounds bilaterally, no wheezing, rales,rhonchi or crepitation. No use of accessory muscles of respiration.  CARDIOVASCULAR: S1, S2 normal. No murmurs, rubs, or gallops.  ABDOMEN: Soft, nontender, nondistended. Bowel sounds present. No organomegaly or mass.  EXTREMITIES: No pedal edema, cyanosis, or clubbing.  NEUROLOGIC: Cranial nerves II through  XII are intact. Muscle strength 5/5 in all extremities. Sensation intact. Gait not checked.  PSYCHIATRIC: The patient is alert and oriented x 3.  SKIN: No obvious rash, lesion, or ulcer.   Physical Exam LABORATORY PANEL:   CBC Recent Labs  Lab 03/14/18 0500  WBC 10.7*  HGB 11.3*  HCT 35.5*  PLT 304   ------------------------------------------------------------------------------------------------------------------  Chemistries  No results for input(s): NA, K, CL, CO2, GLUCOSE, BUN, CREATININE, CALCIUM, MG, AST, ALT, ALKPHOS, BILITOT in the last 168 hours.  Invalid input(s): GFRCGP ------------------------------------------------------------------------------------------------------------------  Cardiac Enzymes Recent Labs  Lab 03/12/18 1233 03/12/18 1252  TROPONINI <0.03 <0.03   ------------------------------------------------------------------------------------------------------------------  RADIOLOGY:  No results found.  ASSESSMENT AND PLAN:  57 year old male with past medical history significant for traumatic brain injury, paroxysmal A. fib, systolic heart failure and diabetes presents to hospital secondary to sepsis and respiratory failure.  *AcuteSevere hypoxic and hypercapnic respiratory failure Resolved Did require ICU care w/ vent management,s/ptracheostomythis admission,on trach collar,pulmonology input greatly appreciated, BTs prn, mucinex,suctioning as needed, continue TFs, aspiration/fall precautions, and transfer toSNF that supports trach collarwhen bed is available  *Acute dysphagia Status post PEG tube placement Continue tube feeds  *AcuteSepsis Resolved secondary to VAP Klebsiella pneumonia Treated with 14-day course of Ancef-discontinued March 11, 2018  *Acute atypical chest pain  Ruled out for acute coronary syndrome, EKG benign  *Paroxysmal atrial fibrillation Controlled ondigoxin and metoprolol not a Candidate for  anticoagulation due to recent GI bleed  *Hx of GIB recent negative endoscopy Continue PPI  *History of TBI/depression and schizophrenia Psychiatry did see patient while in-house Continue valproic acidandSeroquel  *Diabetes mellitus Stable oncurrent regiment  *Chronic stageII decubitus ulcer on buttock- Wound care nurse consulted-continue local wound care  Disposition-social worker working on placement and getting his Medicaidto the right rehab center  All the records are reviewed and case discussed with Care Management/Social Workerr. Management plans discussed with the patient, family and they are in agreement.  CODE STATUS: DNR  TOTAL TIME TAKING CARE OF THIS PATIENT: 35 minutes.     POSSIBLE D/C IN 5 DAYS, DEPENDING ON CLINICAL CONDITION.   Evelena AsaMontell D Salary M.D on 03/15/2018   Between 7am to 6pm - Pager - [phone]  After 6pm go to [email or web address] - password [email or web address]  [facility]  Office  [phone]  CC: Primary care physician; Marcina MillardParaschos, Alexander, MD  Note: This dictation was prepared with Dragon dictation along with smaller phrase technology. Any transcriptional errors that result from this process are unintentional.

## 2018-03-16 LAB — BASIC METABOLIC PANEL
Anion gap: 9 (ref 5–15)
BUN: 25 mg/dL — ABNORMAL HIGH (ref 6–20)
CHLORIDE: 99 mmol/L — AB (ref 101–111)
CO2: 28 mmol/L (ref 22–32)
CREATININE: 0.53 mg/dL — AB (ref 0.61–1.24)
Calcium: 8.7 mg/dL — ABNORMAL LOW (ref 8.9–10.3)
GFR calc non Af Amer: >60 mL/min (ref >60)
Glucose, Bld: 127 mg/dL — ABNORMAL HIGH (ref 65–99)
POTASSIUM: 4.2 mmol/L (ref 3.5–5.1)
SODIUM: 136 mmol/L (ref 135–145)

## 2018-03-16 LAB — GLUCOSE, CAPILLARY
GLUCOSE-CAPILLARY: 115 mg/dL — AB (ref 65–99)
GLUCOSE-CAPILLARY: 135 mg/dL — AB (ref 65–99)
GLUCOSE-CAPILLARY: 142 mg/dL — AB (ref 65–99)
GLUCOSE-CAPILLARY: 169 mg/dL — AB (ref 65–99)
Glucose-Capillary: 131 mg/dL — ABNORMAL HIGH (ref 65–99)
Glucose-Capillary: 167 mg/dL — ABNORMAL HIGH (ref 65–99)

## 2018-03-16 MED ORDER — GUAIFENESIN 100 MG/5ML PO SOLN
10.0000 mL | Freq: Four times a day (QID) | ORAL | Status: DC
  Administered 2018-03-16 – 2018-03-17 (×5): 200 mg via ORAL
  Filled 2018-03-16 (×8): qty 10

## 2018-03-16 MED ORDER — ACETYLCYSTEINE 10% NICU INHALATION SOLUTION
4.0000 mL | Freq: Two times a day (BID) | RESPIRATORY_TRACT | Status: DC | PRN
  Filled 2018-03-16: qty 4

## 2018-03-16 MED ORDER — SCOPOLAMINE 1 MG/3DAYS TD PT72
1.0000 | MEDICATED_PATCH | TRANSDERMAL | Status: DC
  Administered 2018-03-16 – 2018-03-19 (×2): 1.5 mg via TRANSDERMAL
  Filled 2018-03-16 (×2): qty 1

## 2018-03-16 MED ORDER — SODIUM CHLORIDE 3 % IN NEBU
4.0000 mL | INHALATION_SOLUTION | Freq: Every day | RESPIRATORY_TRACT | Status: DC
  Administered 2018-03-16 – 2018-03-17 (×2): 4 mL via RESPIRATORY_TRACT
  Administered 2018-03-18: 15 mL via RESPIRATORY_TRACT
  Administered 2018-03-19: 08:00:00 4 mL via RESPIRATORY_TRACT
  Filled 2018-03-16 (×4): qty 4

## 2018-03-16 NOTE — Progress Notes (Signed)
Assessment done. Pt awake and able to mouth words to communicate. Cooperative with care. Trach sx'd for small amt thick white/yellow sec. 40% trach collar on and no resp distress noted. Peg intact; flushed without diff, cont tf of glucerna on pump with programmed water flush. No c/o expressed at this time.

## 2018-03-16 NOTE — Progress Notes (Signed)
[employer] - [place] at [facility]   PATIENT NAME: Tommy Mathews    MR#:  [id number]  DATE OF BIRTH:  July 23, 1961  SUBJECTIVE:  CHIEF COMPLAINT:   Chief Complaint  Patient presents with  . Respiratory Distress  Case discussed with respiratory therapy as well as nursing, patient continues to have copious thick secretions, will start on Mucinex 4 times daily, pulmonary lavage with saline, and trial of scopolamine patch  REVIEW OF SYSTEMS:  CONSTITUTIONAL: No fever, fatigue or weakness.  EYES: No blurred or double vision.  EARS, NOSE, AND THROAT: No tinnitus or ear pain.  RESPIRATORY: No cough, shortness of breath, wheezing or hemoptysis.  CARDIOVASCULAR: No chest pain, orthopnea, edema.  GASTROINTESTINAL: No nausea, vomiting, diarrhea or abdominal pain.  GENITOURINARY: No dysuria, hematuria.  ENDOCRINE: No polyuria, nocturia,  HEMATOLOGY: No anemia, easy bruising or bleeding SKIN: No rash or lesion. MUSCULOSKELETAL: No joint pain or arthritis.   NEUROLOGIC: No tingling, numbness, weakness.  PSYCHIATRY: No anxiety or depression.   ROS  DRUG ALLERGIES:  No Known Allergies  VITALS:  Blood pressure (!) 148/87, pulse 83, temperature 98.4 F (36.9 C), temperature source Oral, resp. rate 20, height 5\' 10"  (1.778 m), weight 95.7 kg (211 lb), SpO2 96 %.  PHYSICAL EXAMINATION:  GENERAL:  57 y.o.-year-old patient lying in the bed with no acute distress.  EYES: Pupils equal, round, reactive to light and accommodation. No scleral icterus. Extraocular muscles intact.  HEENT: Head atraumatic, normocephalic. Oropharynx and nasopharynx clear.  NECK:  Supple, no jugular venous distention. No thyroid enlargement, no tenderness.  LUNGS: Normal breath sounds bilaterally, no wheezing, rales,rhonchi or crepitation. No use of accessory muscles of respiration.  CARDIOVASCULAR: S1, S2 normal. No murmurs, rubs, or gallops.  ABDOMEN: Soft, nontender, nondistended. Bowel sounds present.  No organomegaly or mass.  EXTREMITIES: No pedal edema, cyanosis, or clubbing.  NEUROLOGIC: Cranial nerves II through XII are intact. Muscle strength 5/5 in all extremities. Sensation intact. Gait not checked.  PSYCHIATRIC: The patient is alert and oriented x 3.  SKIN: No obvious rash, lesion, or ulcer.   Physical Exam LABORATORY PANEL:   CBC Recent Labs  Lab 03/14/18 0500  WBC 10.7*  HGB 11.3*  HCT 35.5*  PLT 304   ------------------------------------------------------------------------------------------------------------------  Chemistries  Recent Labs  Lab 03/16/18 0500  NA 136  K 4.2  CL 99*  CO2 28  GLUCOSE 127*  BUN 25*  CREATININE 0.53*  CALCIUM 8.7*   ------------------------------------------------------------------------------------------------------------------  Cardiac Enzymes Recent Labs  Lab 03/12/18 1233 03/12/18 1252  TROPONINI <0.03 <0.03   ------------------------------------------------------------------------------------------------------------------  RADIOLOGY:  No results found.  ASSESSMENT AND PLAN:  57 year old male with past medical history significant for traumatic brain injury, paroxysmal A. fib, systolic heart failure and diabetes presents to hospital secondary to sepsis and respiratory failure.  *AcuteSevere hypoxic and hypercapnic respiratory failure Resolved Did require ICU carew/vent management,s/ptracheostomythis admission,on trach collar,pulmonology input greatly appreciated, BTs prn,Robitussin,suctioning as needed,scopolamine patch, tracheal suctioning with saline, continue TFs,aspiration/fall precautions, and transfer toSNF that supports trach collarwhen bed is available  *Acute dysphagia Status post PEG tube placement Continue tube feeds  *AcuteSepsis Resolved secondary to VAP Klebsiella pneumonia Treated with 14-day course of Ancef-discontinued March 11, 2018  *Acute atypical chest pain  Ruled out  for acute coronary syndrome, EKG benign  *Paroxysmal atrial fibrillation Controlled ondigoxin and metoprolol not a Candidate for anticoagulation due to recent GI bleed  *Hx of GIB recent negative endoscopy Continue PPI  *History of TBI/depression and schizophrenia Psychiatry did see  patient while in-house Continue valproic acidandSeroquel  *Diabetes mellitus Stable oncurrent regiment  *Chronic stageII decubitus ulcer on buttock- Wound care nurse consulted-continue local wound care   Disposition-social worker working on placement and getting his Medicaidto the right rehab center       All the records are reviewed and case discussed with Care Management/Social Workerr. Management plans discussed with the patient, family and they are in agreement.  CODE STATUS: dnr  TOTAL TIME TAKING CARE OF THIS PATIENT: 35 minutes.     POSSIBLE D/C IN 1-3 DAYS, DEPENDING ON CLINICAL CONDITION.   Tommy Mathews M.D on 03/16/2018   Between 7am to 6pm - Pager - [phone]  After 6pm go to [email or web address] - password [email or web address]  [facility] [place] [facility]  Office  [phone]  CC: Primary care physician; Marcina Millard, MD  Note: This dictation was prepared with Dragon dictation along with smaller phrase technology. Any transcriptional errors that result from this process are unintentional.

## 2018-03-16 NOTE — Progress Notes (Signed)
Suctioned mod amt of thick tan secretions 

## 2018-03-16 NOTE — Progress Notes (Signed)
Pharmacy Electrolyte Monitoring Consult:  Pharmacy consulted to assist in monitoring and replacing electrolytes in this 57 y.o. male admitted on 01/06/2018 with Respiratory Distress   Labs:  Sodium (mmol/L)  Date Value  03/16/2018 136   Potassium (mmol/L)  Date Value  03/16/2018 4.2   Magnesium (mg/dL)  Date Value  47/82/956206/11/2017 1.8   Phosphorus (mg/dL)  Date Value  13/08/657806/11/2017 3.8   Calcium (mg/dL)  Date Value  46/96/295206/16/2019 8.7 (L)   Albumin (g/dL)  Date Value  84/13/244006/01/2018 2.4 (L)    Assessment/Plan: Electrolytes WNL. Stable from last labs 6/5. Will sign off.   Luisa HartChristy, Keiji Melland D 03/16/2018 1:56 PM

## 2018-03-17 LAB — GLUCOSE, CAPILLARY
Glucose-Capillary: 117 mg/dL — ABNORMAL HIGH (ref 65–99)
Glucose-Capillary: 128 mg/dL — ABNORMAL HIGH (ref 65–99)
Glucose-Capillary: 132 mg/dL — ABNORMAL HIGH (ref 65–99)
Glucose-Capillary: 138 mg/dL — ABNORMAL HIGH (ref 65–99)
Glucose-Capillary: 144 mg/dL — ABNORMAL HIGH (ref 65–99)
Glucose-Capillary: 145 mg/dL — ABNORMAL HIGH (ref 65–99)

## 2018-03-17 MED ORDER — GUAIFENESIN 100 MG/5ML PO SOLN
10.0000 mL | Freq: Four times a day (QID) | ORAL | Status: DC
  Administered 2018-03-18 – 2018-03-19 (×5): 200 mg
  Filled 2018-03-17 (×8): qty 10

## 2018-03-17 MED ORDER — FREE WATER
200.0000 mL | Freq: Three times a day (TID) | Status: DC
  Administered 2018-03-17 – 2018-03-19 (×7): 200 mL

## 2018-03-17 MED ORDER — FUROSEMIDE 20 MG PO TABS
20.0000 mg | ORAL_TABLET | Freq: Every day | ORAL | Status: DC
  Administered 2018-03-17 – 2018-03-19 (×3): 20 mg
  Filled 2018-03-17 (×3): qty 1

## 2018-03-17 MED ORDER — GLUCERNA 1.5 CAL PO LIQD
1000.0000 mL | ORAL | Status: DC
  Administered 2018-03-17 – 2018-03-19 (×3): 1000 mL

## 2018-03-17 NOTE — Progress Notes (Signed)
Speech Language Pathology Treatment: Tommy Mathews Speaking valve  Patient Details Name: Tommy Mathews MRN: [id number] DOB: [birth year] Today's Date: 03/17/2018 Time: [id number] SLP Time Calculation (min) (ACUTE ONLY): 42 min  Assessment / Plan / Recommendation Clinical Impression  Pt seen for ongoing assessment and toleration of use of PMV for verbal communication w/ others. Pt more awake today during, and post, OT/PT sessions then w/ SLP now. He has been tolerating PMV placement for several mins w/ Staff Supervision w/ no decompensation in breathing or discomfort during use.  Pt requires increased O2 support at 40%; 60% during PMV use w/ therapy sessions(OT/PT) at this time for maintaining O2 sats in mid90s. Pt receives frequent tracheal suctioning for management of secretions. Pt has cuff deflated at baseline; on trach collar O2 support.  Speaking valve placed again for verbal communication w/ pt. He reported he felt "good" wearing it during his recent PT session today - encouraged him to wear it so that he can communicate w/ the therapists during sessions. No O2 desaturation or difficulty breathing was noted during this session; pt verbalized easily w/ SLP w/ adequate volume and vocal quality - loss of volume x1 noted. Pt has a Shiley #8 in place; will recommend a downsize when pt is able to maintain/reduce tracheal secretions d/t concern for mucous plugging. PMV removed and pt allowed to rest and look at his magazine/TV. Education was conducted to inform pt about the PMV, its use, and precautions - also showed pt how to clean it w/ warm/soapy water. Discussed w/ NSG the use of the PMV w/ Therapy session w/ 100% supervision during; use of PMV by NSG/MD when in room - do not recommend unsupervised placement d/t pt's increased tracheal secretions requiring scheduled tracheal suctioning and their thickness at this time. NSG agreed, pt agreed. ST services will continue to f/u w/ pt's status and use of PMV  more PRN but when SUPERVISED. Readiness for po trials can be considered ONCE tracheal secretions are MINIMAL and CONTROLLED, and when pt is able to wear the PMV more PRN. Recommend frequent oral care for hygiene and stimulation. NSG/SW agreed.    HPI HPI: Pt is a 57 y.o. male w/ h/o DM, GERD, HLD, HTN who presents with respiratory failure with hypoxia.  Patient required intubation here in the ED.  He also had a large amount of dark coffee-ground type material suctioned from his OG tube.  Strong concern for possible GI bleed.  Patient is on Xarelto.  He presented from a facility with little known medical history, he does have a med list which shows Xarelto on a number of other medications.  He is also found to have multifocal pneumonia, hypertension, likely UTI.  Pt is now tolerating trach collar wear w/ cuff deflated but continues to have moderate+ thick tracheal secretions requiring frequent suctioning to manage. Pt does require increased O2 support currently at 40%.       SLP Plan  Continue with current plan of care(PMV use and education w/ staff/pt/family)       Recommendations  Diet recommendations: (n/a) Liquids provided via: (n/a) Medication Administration: Via alternative means(PEG placement)      Patient may use Passy-Mathews Speech Valve: During all therapies with supervision;Caregiver trained to provide supervision(w/ MD/NSG) PMSV Supervision: Full(d/t secretion level)         General recommendations: (ongoing Rehab f/u) Oral Care Recommendations: Oral care QID;Staff/trained caregiver to provide oral care Follow up Recommendations: LTACH SLP Visit Diagnosis: Aphonia (R49.1)(tracheostomy) Plan: Continue with current plan  of care(PMV use and education w/ staff/pt/family)       GO                Tommy Som, MS, CCC-SLP Tommy Mathews 03/17/2018, 2:20 PM

## 2018-03-17 NOTE — Progress Notes (Signed)
Nutrition Follow-up  DOCUMENTATION CODES:   Obesity unspecified  INTERVENTION:  Continue Glucerna 1.5 Cal at 70 mL/hr via G-tube. Provides 2520 kcal, 139 grams of protein, 1277 mL H2O daily.  Increase free water flush to 200 mL Q8hrs. Provides a total of 1877 mL H2O daily including water in tube feeding.  Will discontinue Juven BID at this time as stage II pressure ulcer has now healed.  NUTRITION DIAGNOSIS:   Inadequate oral intake related to inability to eat as evidenced by NPO status.  Ongoing - addressing with TF regimen.  GOAL:   Provide needs based on ASPEN/SCCM guidelines  Met with TF regimen.  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   -On 6/12 O2 saturations dropped and patient had very thick secretions. Tube feeds were held, but were restarted on 6/13. -Pending placement at a SNF that can accept patients with tracheostomy. Patient is on difficult to place list and there are no SNF bed offers at this time.  Patient was working with OT at time of RD assessment. He has also been working with PT, and using [name] Speech Valve with SLP. Per documentation in chart patient is receiving frequent suctioning for secretions. Per discussion with RN patient still tolerating tube feeds well.  Enteral Access: 20 Fr. G-tube with C-clamp placed 4/23; placement verified by relook endoscopy, 4.5 cm at external bumper  TF: pt is tolerating Glucerna 1.5 Cal at 70 mL/hr  Medications reviewed and include: Colace, free  water flush 150 mL Q8hrs, Lasix 20 mg daily per tube, Novolog 0-20 units Q4hrs, Lantus 20 units QHS, magnesium oxide 400 mg BID per tube, Protonix, Seroquel, valproic acid.  Labs reviewed: CBG 117-169 past 24 hrs.  I/O: 1550 mL UOP yesterday (0.7 mL/kg/hr); 1 BM yesterday  Weight trend: 95.7 kg on 6/14; fairly wt stable from last week  Diet Order:   Diet Order    None      EDUCATION NEEDS:   Not appropriate for education at this time  Skin:  Skin Assessment: Skin Integrity Issues: Skin Integrity Issues:: Stage II, Other (Comment) Stage II: N/A - no longer being documented Other: MSAD to groin  Last BM:  03/16/2018 - large type 6  Height:   Ht Readings from Last 1 Encounters:  01/07/18 '5\' 10"'  (1.778 m)    Weight:   Wt Readings from Last 1 Encounters:  03/14/18 211 lb (95.7 kg)    Ideal Body Weight:  75.5 kg  BMI:  Body mass index is 30.28 kg/m.  Estimated Nutritional Needs:   Kcal:  6283-6629 (MSJ x 1.2-1.4); 2485 (25 kcal/kg)  Protein:  113-135 grams (1.5-1.8 grams/kg IBW)  Fluid:  1.8 L/day  Willey Blade, MS, RD, LDN Office: [phone] Pager: [phone] After Hours/Weekend Pager: [phone]

## 2018-03-17 NOTE — Progress Notes (Signed)
Occupational Therapy Treatment Patient Details Name: Tommy Mathews MRN: [id number] DOB: 1961-04-23 Today's Date: 03/17/2018    History of present illness 57 yo male presented to ER (4/8) secondary to respiratory distress (requiring intubation in ED); admitted with severe sepsis, shock due to multifocal PNA (possible aspiration event), UTI.  Mod amount of coffee-ground material suctioned through OG tube initially; suspected with GIB.  Hospital course further significant for difficulty weaning and persistent ventilator dyssynchrony, requiring administration of vecuronium; afib; PEG placement (4/23), trach placement (4/25); intermittent afib with RVR (controlled with medication as needed).  Required extended time for vent weaning; now tolerating trach collar this date.   OT comments  Pt seen for OT tx this date. Pt with PMV in place, able to verbalize needs throughout session. RT at start of session increased TC O2 to 60%. Pt alert, oriented, and eager to work with therapist. VitalGo tilt bed used this session with proper set up and management of all lines/leads to ensure safety. Pt O2 sats monitored t/o. PEG feedings put on hold during session. Pt was able to progress to 60 degrees for 5 minutes and then 70 degrees for 10 minutes with focus on performing UB grooming tasks with set up assist. O2 sats remained >94% on 60% TC throughout. Min-mod verbal cues increasing to mod-max verbal cues for midline posture (L lateral lean progresses with fatigue). Pt reporting fatigue and returned to supine position then with HOB elevated to pt's comfort. Pt verbalizes being pleased with his session and eager to work with PT in the afternoon. O2 TC decreased to 40% at end of session, PEG feeding resumed. Pt with all needs in reach at end of session. PMV in place, RN notified directly after session to remove. Pt tolerated session well this date. Will continue to progress.    Follow Up Recommendations  SNF    Equipment  Recommendations  Other (comment)(TBD)    Recommendations for Other Services      Precautions / Restrictions Precautions Precautions: Fall;Other (comment) Precaution Comments: BPO/PEG, trach Restrictions Weight Bearing Restrictions: No       Mobility Bed Mobility                  Transfers                 General transfer comment: tilt bed option used this date    Balance                                           ADL either performed or assessed with clinical judgement   ADL Overall ADL's : Needs assistance/impaired Eating/Feeding: NPO Eating/Feeding Details (indicate cue type and reason): PEG tube Grooming: Set up Grooming Details (indicate cue type and reason): Pt in tilt bed at 70 degrees, washed face and applied moisterizer to face and BUE with set up assist and min-mod verbal cues to correct L lateral lean. O2 sats remained >94% on 60% TC.                                      Vision Patient Visual Report: No change from baseline     Perception     Praxis      Cognition Arousal/Alertness: Awake/alert Behavior During Therapy: WFL for tasks assessed/performed Overall Cognitive Status: Within Functional Limits  for tasks assessed                                 General Comments: passy muir used this session and pt alert and oriented, able to verbalize needs/wants        Exercises Other Exercises Other Exercises: VitalGo tilt bed.  Pt was able to progress to 60 degrees for 5 minutes, 70 degrees for 10 minutes to perform UB grooming tasks. O2 sats remained >94% on 60% TC throughout. Min-mod verbal cues increasing to mod-max verbal cues for midline posture (L lateral lean progresses with fatigue).    Shoulder Instructions       General Comments      Pertinent Vitals/ Pain       Pain Assessment: No/denies pain Pain Intervention(s): Monitored during session  Home Living                                           Prior Functioning/Environment              Frequency  Min 2X/week        Progress Toward Goals  OT Goals(current goals can now be found in the care plan section)  Progress towards OT goals: Progressing toward goals  Acute Rehab OT Goals OT Goal Formulation: With patient Time For Goal Achievement: 03/18/18 Potential to Achieve Goals: Good  Plan Discharge plan remains appropriate;Frequency remains appropriate    Co-evaluation                 AM-PAC PT "6 Clicks" Daily Activity     Outcome Measure   Help from another person eating meals?: Total Help from another person taking care of personal grooming?: A Little Help from another person toileting, which includes using toliet, bedpan, or urinal?: Total Help from another person bathing (including washing, rinsing, drying)?: A Lot Help from another person to put on and taking off regular upper body clothing?: A Lot Help from another person to put on and taking off regular lower body clothing?: A Lot 6 Click Score: 11    End of Session Equipment Utilized During Treatment: Other (comment)(Vitalgo tilt bed)  OT Visit Diagnosis: Other abnormalities of gait and mobility (R26.89);Muscle weakness (generalized) (M62.81);Other symptoms and signs involving cognitive function   Activity Tolerance Patient tolerated treatment well   Patient Left in bed;with call bell/phone within reach;with bed alarm set   Nurse Communication Mobility status;Other (comment)(PEG turned back on, PMV in place (RN to remove))        Time: [id number] OT Time Calculation (min): 54 min  Charges: OT General Charges $OT Visit: 1 Visit OT Treatments $Self Care/Home Management : 53-67 mins  Richrd Prime, MPH, MS, OTR/L ascom [phone] 03/17/18, 11:57 AM

## 2018-03-17 NOTE — Progress Notes (Signed)
Physical Therapy Treatment Patient Details Name: Tommy Mathews MRN: [id number] DOB: 1961-01-01 Today's Date: 03/17/2018    History of Present Illness 57 yo male presented to ER (4/8) secondary to respiratory distress (requiring intubation in ED); admitted with severe sepsis, shock due to multifocal PNA (possible aspiration event), UTI.  Mod amount of coffee-ground material suctioned through OG tube initially; suspected with GIB.  Hospital course further significant for difficulty weaning and persistent ventilator dyssynchrony, requiring administration of vecuronium; afib; PEG placement (4/23), trach placement (4/25); intermittent afib with RVR (controlled with medication as needed).  Required extended time for vent weaning; now tolerating trach collar this date.    PT Comments    Patient tolerating session on ATC with PMV (FiO2 increased to 60% per respiratory).  Good voice control; good ability to respond to/interact with therapists and appropriately follow commands, direct care.  Sats >88% throughout session. Improved sitting balance edge of bed compared to previous sessions.  Continues with L lateral lean, but corrects with verbal cuing and environmental support.  Able to complete sit/stand with RW, mod assist +2 from elevated bed surface; significant difficulty maintaining truncal extension with dynamic activity, requiring max assist +2 to complete bed/chair with RW. Patient remains engaged throughout session; more alert and participatory, tolerating longer duration, increased intensity of activity.    Follow Up Recommendations  SNF     Equipment Recommendations       Recommendations for Other Services       Precautions / Restrictions Precautions Precautions: Fall;Other (comment) Precaution Comments: NPO/PEG, trach Restrictions Weight Bearing Restrictions: No    Mobility  Bed Mobility Overal bed mobility: Needs Assistance Bed Mobility: Supine to Sit     Supine to sit: Min  assist     General bed mobility comments: indep mobilizes bilat LEs and initiates use of bedrail to assist with movement transition.  significant improvement in task initiation, trunk control and overall task independence  Transfers Overall transfer level: Needs assistance Equipment used: Rolling walker (2 wheeled) Transfers: Sit to/from Stand Sit to Stand: Mod assist;+2 physical assistance         General transfer comment: elevated bed surface  Ambulation/Gait Ambulation/Gait assistance: Max assist;+2 physical assistance Gait Distance (Feet): 5 Feet Assistive device: Rolling walker (2 wheeled)       General Gait Details: significant difficulty maintaining truncal extension this date; max/dep assist to unweight/advance LEs   Stairs             Wheelchair Mobility    Modified Rankin (Stroke Patients Only)       Balance Overall balance assessment: Needs assistance Sitting-balance support: Feet supported;No upper extremity supported Sitting balance-Leahy Scale: Fair Sitting balance - Comments: maintains with close sup this date.  Increased lean to L, but corrects with verbal cuing and environmental support (of pillows positioned under R UE) Postural control: Left lateral lean Standing balance support: Bilateral upper extremity supported Standing balance-Leahy Scale: Poor Standing balance comment: static stance with RW, mod assist +2; dynamic stance, max assist +2.  Poor ability to maintain truncal extension with dynamic activities                            Cognition Arousal/Alertness: Awake/alert Behavior During Therapy: WFL for tasks assessed/performed Overall Cognitive Status: Within Functional Limits for tasks assessed  General Comments: passy muir used this session (placed/removed by respiratory) and pt alert and oriented, able to verbalize needs/wants      Exercises      General Comments         Pertinent Vitals/Pain Pain Assessment: No/denies pain Pain Score: 0-No pain    Home Living                      Prior Function            PT Goals (current goals can now be found in the care plan section) Acute Rehab PT Goals PT Goal Formulation: With patient Time For Goal Achievement: 03/22/18 Potential to Achieve Goals: Fair Progress towards PT goals: Progressing toward goals    Frequency    Min 2X/week      PT Plan Current plan remains appropriate    Co-evaluation              AM-PAC PT "6 Clicks" Daily Activity  Outcome Measure  Difficulty turning over in bed (including adjusting bedclothes, sheets and blankets)?: Unable Difficulty moving from lying on back to sitting on the side of the bed? : Unable Difficulty sitting down on and standing up from a chair with arms (e.g., wheelchair, bedside commode, etc,.)?: Unable Help needed moving to and from a bed to chair (including a wheelchair)?: Total Help needed walking in hospital room?: Total Help needed climbing 3-5 steps with a railing? : Total 6 Click Score: 6    End of Session Equipment Utilized During Treatment: Gait belt;Oxygen Activity Tolerance: Patient tolerated treatment well Patient left: in chair;with call bell/phone within reach;with chair alarm set Nurse Communication: Mobility status(recommend use of total lift for return to bed (sling in room)) PT Visit Diagnosis: Difficulty in walking, not elsewhere classified (R26.2);Muscle weakness (generalized) (M62.81) Pain - Right/Left: Left Pain - part of body: Hip     Time: 1225-1252 PT Time Calculation (min) (ACUTE ONLY): 27 min  Charges:  $Therapeutic Activity: 23-37 mins                    G Codes:       Evaluation/treatment session was lead and completed by Iris Pert, SPT; directly observed, guided and documented by Cephus Slater, PT.  Tommy Rainwater. [name], PT, DPT, NCS 03/17/18, 5:14 PM [phone]

## 2018-03-17 NOTE — Progress Notes (Signed)
Suctioned mod amt of tan secretions

## 2018-03-17 NOTE — Progress Notes (Signed)
Spoke with Trey PaulaJeff, RT regarding patient's O2 being 87%.  RT to come and increase patients settings on trach collar.  Pt appears in no distress.  Pt refusing suctioning at this time.  Will continue to monitor.

## 2018-03-17 NOTE — Clinical Social Work Note (Signed)
Patient remains on the difficult to place list. CSW has reached out to multiple SNF but no one has any long term medicaid beds available. CSW will continue to look for long term placement for patient.   Ruthe Mannanandace Doyal Saric MSW, [address]CSWA [phone]

## 2018-03-17 NOTE — Progress Notes (Signed)
No acute events overnight. Sat on 40%TC have maintained between 92-97%. Pt has been trach suctioned x2 by RT this shift for mod thick secretions. Slept in intervals. Tolerating TF via peg with scant residuals. Repositioned through the night, denied pain. Follows verbal commands and is approp with care.

## 2018-03-17 NOTE — Progress Notes (Signed)
[facility]     Assessment and Plan:  Acute on hypoxic respiratory failure with copious secretions and atelectasis.  Poor airway clearance due to tracheostomy and weakness/debility, appears improved today. Pt is able to clear more secretions. D/w nursing and RT, pt continues to have copious thick secretions.  S/p trach and feeding tube.   Continue nebs q4 followed by suctioning.  Continue Mucomyst.  Up in chair as tolerated.  Wean down oxygen as tolerated.  [name] valve as tolerated.   Date: 03/17/2018  MRN# [id number] Tommy Mathews June 11, 1961   Tommy Mathews is a 57 y.o. old male seen in follow up for chief complaint of  Chief Complaint  Patient presents with  . Respiratory Distress     Synopsis The patient is a 57 year old male admitted to the hospital on 01/06/2018 for acute GI bleeding.  He had acute hypoxic respiratory failure at that time and required intubation.  His course was complicated by a severe pneumonia, atrial fibrillation, acute kidney injury, ARDS.  Over the ensuing weeks he improved, however he could not be weaned on the ventilator.  He subsequently underwent a PEG, then trach on 4/25 He was seen by cardiology, noted to have dilated cardiomyopathy, maintained on amiodarone. On 6/12 pulmonary service was reconsulted as patient has been having increasing dyspnea on the floor with copious secretions, requiring frequent suctioning.  Guaifenesin 4 times daily, duo nebs every 4 hours followed by suctioning.  Subjective  Patient feels well today. He is not up in a chair. Patient remains on trach collar 35%.  He is being maintained on Pulmicort nebulized twice daily.   MAJOR EVENTS/TEST RESULTS: 04/08 Admission as above 04/08 CT chest: Multifocal severe pneumonia with RUL collapse and soft tissue obstructing RUL bronchus. 04/08 CTAP: Mild small bowel ileus, potential enteritis. Nasogastric tube terminates in distal stomach. Cholelithiasis  without CT findings of acute cholecystitis.Consider bronchoscopy.  04/09 nephrology consultation: No acute indication for dialysis at present. 04/09 gastroenterology consultation: Recommend high-dose PPI therapy. 04/10 severely hypoxemic despite 100% FiO2.  Recruitment maneuver with significant improvement.  PEEP increased and ARDS strategy on ventilator implemented.  Neuromuscular blockade implemented due to ventilator dyssynchrony. 04/11 PAF with RVR > converted with amiodarone. Gas exchange improving. Off Nimbex 04/13 High airway pressure with vent dyssynchony; given Vecuronium; FIO2 increased to 50%  4/13 Amiodarone D/C because of SB 4/15 increased HR, EF 20% afib with RVR 4/16 remains intubated 4/17 failed weaning trials, HR remains elevated 4/23 PEG placement 4/25 tracheostomy 6/08 transfer to floor; tolerated trach collar x48 hours 06/12 Desat with copious secretions.  6/13 Doing better with airway clearance.   **Echocardiogram 01/10/2018; EF equals 20%, PA peak pressure equals 40 mmHg.    Medication:    Current Facility-Administered Medications:  .  acetaminophen (TYLENOL) suppository 650 mg, 650 mg, Rectal, Q4H PRN, Samaan, Maged, MD .  acetaminophen (TYLENOL) tablet 650 mg, 650 mg, Per Tube, Q4H PRN, Merwyn Katos, MD, 650 mg at 03/14/18 0834 .  acetylcysteine (MUCOMYST) NICU inhalation solution 10%, 4 mL, Nebulization, Q12H PRN, Salary, Montell D, MD .  bisacodyl (DULCOLAX) suppository 10 mg, 10 mg, Rectal, Daily PRN, Merwyn Katos, MD, 10 mg at 01/20/18 1048 .  bisacodyl (DULCOLAX) suppository 10 mg, 10 mg, Rectal, Daily PRN, Merwyn Katos, MD, 10 mg at 02/26/18 1501 .  budesonide (PULMICORT) nebulizer solution 0.5 mg, 0.5 mg, Nebulization, BID, Kasa, Kurian, MD, 0.5 mg at 03/17/18 0803 .  clonazePAM (KLONOPIN) tablet 0.25 mg, 0.25 mg, Oral,  BID, He, Jun, MD, 0.25 mg at 03/16/18 2017 .  digoxin (LANOXIN) tablet 0.25 mg, 0.25 mg, Per Tube, Daily, Paraschos, Alexander,  MD, 0.25 mg at 03/16/18 0845 .  docusate (COLACE) 50 MG/5ML liquid 100 mg, 100 mg, Per Tube, BID PRN, Merwyn KatosSimonds, David B, MD .  docusate (COLACE) 50 MG/5ML liquid 50 mg, 50 mg, Per Tube, BID, Samaan, Maged, MD, 50 mg at 03/16/18 0845 .  enoxaparin (LOVENOX) injection 40 mg, 40 mg, Subcutaneous, Q24H, Samaan, Maged, MD, 40 mg at 03/16/18 0844 .  feeding supplement (GLUCERNA 1.5 CAL) liquid 1,000 mL, 1,000 mL, Per Tube, Continuous, Salary, Montell D, MD, Last Rate: 70 mL/hr at 03/16/18 1152, 1,000 mL at 03/16/18 1152 .  FLUoxetine (PROZAC) 20 MG/5ML solution 20 mg, 20 mg, Oral, Daily, Samaan, Maged, MD, 20 mg at 03/16/18 0845 .  free water 150 mL, 150 mL, Per Tube, Q8H, Jackson Latinoichards, Karol A, MD, 150 mL at 03/17/18 0500 .  furosemide (LASIX) tablet 20 mg, 20 mg, Per Tube, Daily, Salary, Montell D, MD .  gabapentin (NEURONTIN) 250 MG/5ML solution 200 mg, 200 mg, Per Tube, Q12H, Annett Fabianichards, Karol A, MD, 200 mg at 03/16/18 2218 .  guaiFENesin (ROBITUSSIN) 100 MG/5ML solution 200 mg, 10 mL, Oral, QID, Salary, Montell D, MD, 200 mg at 03/16/18 2015 .  hydrALAZINE (APRESOLINE) injection 10-40 mg, 10-40 mg, Intravenous, Q4H PRN, Jackson Latinoichards, Karol A, MD .  insulin aspart (novoLOG) injection 0-20 Units, 0-20 Units, Subcutaneous, Q4H, Merwyn KatosSimonds, David B, MD, 3 Units at 03/17/18 [id number] .  insulin glargine (LANTUS) injection 20 Units, 20 Units, Subcutaneous, QHS, Conforti, John, DO, 20 Units at 03/16/18 2016 .  ipratropium-albuterol (DUONEB) 0.5-2.5 (3) MG/3ML nebulizer solution 3 mL, 3 mL, Nebulization, Q4H PRN, Gouru, Aruna, MD .  ipratropium-albuterol (DUONEB) 0.5-2.5 (3) MG/3ML nebulizer solution 3 mL, 3 mL, Nebulization, Q4H, Nicholos Johnsamachandran, Saquoia Sianez, MD, 3 mL at 03/17/18 0803 .  ketoconazole (NIZORAL) 2 % cream, , Topical, Daily PRN, Eugenie NorrieBlakeney, Dana G, NP .  lisinopril (PRINIVIL,ZESTRIL) tablet 10 mg, 10 mg, Per Tube, Daily, Annett Fabianichards, Karol A, MD, 10 mg at 03/16/18 0845 .  magnesium oxide (MAG-OX) tablet 400 mg, 400 mg, Per  Tube, BID, Erin FullingKasa, Kurian, MD, 400 mg at 03/16/18 2018 .  MEDLINE mouth rinse, 15 mL, Mouth Rinse, 10 times per day, Merwyn KatosSimonds, David B, MD, 15 mL at 03/17/18 0513 .  metoprolol tartrate (LOPRESSOR) tablet 12.5 mg, 12.5 mg, Per Tube, Q6H, Annett Fabianichards, Karol A, MD, 12.5 mg at 03/17/18 0500 .  nutrition supplement (JUVEN) (JUVEN) powder packet 1 packet, 1 packet, Per Tube, BID, Erin FullingKasa, Kurian, MD, 1 packet at 03/16/18 2019 .  nystatin (MYCOSTATIN) 100000 UNIT/ML suspension 500,000 Units, 5 mL, Oral, QID, Alford HighlandWieting, Richard, MD, 500,000 Units at 03/16/18 2020 .  oxyCODONE-acetaminophen (PERCOCET/ROXICET) 5-325 MG per tablet 1 tablet, 1 tablet, Per Tube, Q6H PRN, Ihor AustinPyreddy, Pavan, MD, 1 tablet at 03/14/18 0542 .  pantoprazole sodium (PROTONIX) 40 mg/20 mL oral suspension 40 mg, 40 mg, Per Tube, Daily, Merwyn KatosSimonds, David B, MD, 40 mg at 03/16/18 0844 .  QUEtiapine (SEROQUEL) tablet 200 mg, 200 mg, Per Tube, QHS, Enedina FinnerPatel, Sona, MD, 200 mg at 03/16/18 2017 .  QUEtiapine (SEROQUEL) tablet 400 mg, 400 mg, Oral, Daily, Enid BaasKalisetti, Radhika, MD, 400 mg at 03/16/18 0845 .  scopolamine (TRANSDERM-SCOP) 1 MG/3DAYS 1.5 mg, 1 patch, Transdermal, Q72H, Salary, Montell D, MD, 1.5 mg at 03/16/18 1152 .  sodium chloride flush (NS) 0.9 % injection 10-40 mL, 10-40 mL, Intracatheter, PRN, Merwyn KatosSimonds, David B, MD, 40 mL at  03/14/18 2013 .  sodium chloride HYPERTONIC 3 % nebulizer solution 4 mL, 4 mL, Nebulization, Daily, Salary, Montell D, MD, 4 mL at 03/17/18 0803 .  valproic acid (DEPAKENE) solution 500 mg, 500 mg, Per Tube, BID, Samaan, Maged, MD, 500 mg at 03/16/18 2016   Allergies:  Patient has no known allergies.  Review of Systems: Could not provide ROS due to TBI.   Physical Examination:   VS: BP (!) 141/84 (BP Location: Left Arm)   Pulse 70   Temp 98 F (36.7 C) (Oral)   Resp 18   Ht 5\' 10"  (1.778 m)   Wt 211 lb (95.7 kg)   SpO2 92%   BMI 30.28 kg/m    General Appearance: No distress, feels better.   Neuro:without focal  findings, nonverbal HEENT: PERRLA, EOM intact.  Tracheostomy in place Pulmonary: Continued scattered bilateral rhonchi, R>L.   CardiovascularNormal S1,S2.  No m/r/g.   Abdomen: Benign, Soft, non-tender. Renal:  No costovertebral tenderness  GU:  Not performed at this time. Endoc: No evident thyromegaly, no signs of acromegaly. Skin:   warm, no rash. Extremities: normal, no cyanosis, clubbing.   LABORATORY PANEL:   CBC Recent Labs  Lab 03/14/18 0500  WBC 10.7*  HGB 11.3*  HCT 35.5*  PLT 304   ------------------------------------------------------------------------------------------------------------------  Chemistries  Recent Labs  Lab 03/16/18 0500  NA 136  K 4.2  CL 99*  CO2 28  GLUCOSE 127*  BUN 25*  CREATININE 0.53*  CALCIUM 8.7*   ------------------------------------------------------------------------------------------------------------------  Cardiac Enzymes Recent Labs  Lab 03/12/18 1252  TROPONINI <0.03   ------------------------------------------------------------  RADIOLOGY:   No results found for this or any previous visit. No results found for this or any previous visit. ------------------------------------------------------------------------------------------------------------------  Thank  you for allowing [facility] to assist in the care of your patient. Our recommendations are noted above.  Please contact us if we can be of further service.   Wells Guiles, MD.  [facility] Office Number: [phone]  Santiago Glad, M.D.  Billy Fischer, M.D  03/17/2018

## 2018-03-17 NOTE — Progress Notes (Signed)
[employer] [facility] [place] [facility]   PATIENT NAME: Tommy Mathews    MR#:  [id number]  DATE OF BIRTH:  1961/07/18  SUBJECTIVE:  CHIEF COMPLAINT:   Chief Complaint  Patient presents with  . Respiratory Distress  Patient without complaint, no events overnight per nursing staff, continued secretions letter small to moderate amount, receiving frequent suctioning  REVIEW OF SYSTEMS:  CONSTITUTIONAL: No fever, fatigue or weakness.  EYES: No blurred or double vision.  EARS, NOSE, AND THROAT: No tinnitus or ear pain.  RESPIRATORY: No cough, shortness of breath, wheezing or hemoptysis.  CARDIOVASCULAR: No chest pain, orthopnea, edema.  GASTROINTESTINAL: No nausea, vomiting, diarrhea or abdominal pain.  GENITOURINARY: No dysuria, hematuria.  ENDOCRINE: No polyuria, nocturia,  HEMATOLOGY: No anemia, easy bruising or bleeding SKIN: No rash or lesion. MUSCULOSKELETAL: No joint pain or arthritis.   NEUROLOGIC: No tingling, numbness, weakness.  PSYCHIATRY: No anxiety or depression.   ROS  DRUG ALLERGIES:  No Known Allergies  VITALS:  Blood pressure (!) 141/84, pulse 70, temperature 98 F (36.7 C), temperature source Oral, resp. rate 18, height 5\' 10"  (1.778 m), weight 95.7 kg (211 lb), SpO2 92 %.  PHYSICAL EXAMINATION:  GENERAL:  57 y.o.-year-old patient lying in the bed with no acute distress.  EYES: Pupils equal, round, reactive to light and accommodation. No scleral icterus. Extraocular muscles intact.  HEENT: Head atraumatic, normocephalic. Oropharynx and nasopharynx clear.  NECK:  Supple, no jugular venous distention. No thyroid enlargement, no tenderness.  LUNGS: Normal breath sounds bilaterally, no wheezing, rales,rhonchi or crepitation. No use of accessory muscles of respiration.  CARDIOVASCULAR: S1, S2 normal. No murmurs, rubs, or gallops.  ABDOMEN: Soft, nontender, nondistended. Bowel sounds present. No organomegaly or mass.  EXTREMITIES: No pedal edema,  cyanosis, or clubbing.  NEUROLOGIC: Cranial nerves II through XII are intact. Muscle strength 5/5 in all extremities. Sensation intact. Gait not checked.  PSYCHIATRIC: The patient is alert and oriented x 3.  SKIN: No obvious rash, lesion, or ulcer.   Physical Exam LABORATORY PANEL:   CBC Recent Labs  Lab 03/14/18 0500  WBC 10.7*  HGB 11.3*  HCT 35.5*  PLT 304   ------------------------------------------------------------------------------------------------------------------  Chemistries  Recent Labs  Lab 03/16/18 0500  NA 136  K 4.2  CL 99*  CO2 28  GLUCOSE 127*  BUN 25*  CREATININE 0.53*  CALCIUM 8.7*   ------------------------------------------------------------------------------------------------------------------  Cardiac Enzymes Recent Labs  Lab 03/12/18 1233 03/12/18 1252  TROPONINI <0.03 <0.03   ------------------------------------------------------------------------------------------------------------------  RADIOLOGY:  No results found.  ASSESSMENT AND PLAN:  57 year old male with past medical history significant for traumatic brain injury, paroxysmal A. fib, systolic heart failure and diabetes presents to hospital secondary to sepsis and respiratory failure.  *AcuteSevere hypoxic and hypercapnic respiratory failure Resolved Did require ICU carew/vent management,s/ptracheostomythis admission,on trach collar,pulmonology input greatly appreciated,BTs prn,Robitussin,suctioning as needed,scopolamine patch, tracheal suctioning with saline, continue TFs,aspiration/fall precautions, and transfer toLTAC/SNF that supports trach collarwhen bed is available  *Acute dysphagia Status post PEG tube placement Continue tube feeds  *AcuteSepsis Resolved secondary to VAP Klebsiella pneumonia Treated with 14-day course of Ancef-discontinued March 11, 2018  *Acute atypical chest pain  Ruled out for acute coronary syndrome, EKG  benign  *Paroxysmal atrial fibrillation Controlled ondigoxin and metoprolol not a Candidate for anticoagulation due to recent GI bleed  *Hx of GIB recent negative endoscopy Continue PPI  *History of TBI/depression and schizophrenia Psychiatry did see patient while in-house Continue valproic acidandSeroquel  *Diabetes mellitus Stable oncurrent regiment  *Chronic  stageII 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 Management plans discussed with the patient, family and they are in agreement.  CODE STATUS: DNR  TOTAL TIME TAKING CARE OF THIS PATIENT: 35 minutes.     POSSIBLE D/C IN 1-3 DAYS, DEPENDING ON CLINICAL CONDITION.   Evelena Asa Ledora Delker M.D on 03/17/2018   Between 7am to 6pm - Pager - [phone]  After 6pm go to [email or web address] - password [email or web address]  [facility]  Office  [phone]  CC: Primary care physician; Marcina Millard, MD  Note: This dictation was prepared with Dragon dictation along with smaller phrase technology. Any transcriptional errors that result from this process are unintentional.

## 2018-03-18 LAB — GLUCOSE, CAPILLARY
GLUCOSE-CAPILLARY: 133 mg/dL — AB (ref 65–99)
GLUCOSE-CAPILLARY: 107 mg/dL — AB (ref 65–99)
GLUCOSE-CAPILLARY: 136 mg/dL — AB (ref 65–99)
Glucose-Capillary: 114 mg/dL — ABNORMAL HIGH (ref 65–99)
Glucose-Capillary: 146 mg/dL — ABNORMAL HIGH (ref 65–99)
Glucose-Capillary: 156 mg/dL — ABNORMAL HIGH (ref 65–99)

## 2018-03-18 NOTE — Progress Notes (Signed)
[employer] [facility] [facility] [place] [facility]   PATIENT NAME: Tommy PangJames Memoli    MR#:  [id number]  DATE OF BIRTH:  Mar 11, 1961  SUBJECTIVE:  CHIEF COMPLAINT:   Chief Complaint  Patient presents with  . Respiratory Distress  Patient has hypoxia due to secretion, receiving frequent suctioning.  He has no complaints.  REVIEW OF SYSTEMS:  CONSTITUTIONAL: No fever, fatigue or weakness.  EYES: No blurred or double vision.  EARS, NOSE, AND THROAT: No tinnitus or ear pain.  RESPIRATORY: No cough, shortness of breath, wheezing or hemoptysis.  CARDIOVASCULAR: No chest pain, orthopnea, edema.  GASTROINTESTINAL: No nausea, vomiting, diarrhea or abdominal pain.  GENITOURINARY: No dysuria, hematuria.  ENDOCRINE: No polyuria, nocturia,  HEMATOLOGY: No anemia, easy bruising or bleeding SKIN: No rash or lesion. MUSCULOSKELETAL: No joint pain or arthritis.   NEUROLOGIC: No tingling, numbness, weakness.  PSYCHIATRY: No anxiety or depression.   ROS  DRUG ALLERGIES:  No Known Allergies  VITALS:  Blood pressure 120/84, pulse 88, temperature 99 F (37.2 C), temperature source Oral, resp. rate 20, height 5\' 10"  (1.778 m), weight 214 lb (97.1 kg), SpO2 96 %.  PHYSICAL EXAMINATION:  GENERAL:  57 y.o.-year-old patient lying in the bed with no acute distress.  EYES: Pupils equal, round, reactive to light and accommodation. No scleral icterus. Extraocular muscles intact.  HEENT: Head atraumatic, normocephalic. Oropharynx and nasopharynx clear.  NECK:  Supple, no jugular venous distention. No thyroid enlargement, no tenderness.  LUNGS: Normal breath sounds bilaterally, no wheezing, rales,rhonchi or crepitation. No use of accessory muscles of respiration.  CARDIOVASCULAR: S1, S2 normal. No murmurs, rubs, or gallops.  ABDOMEN: Soft, nontender, nondistended. Bowel sounds present. No organomegaly or mass.  EXTREMITIES: No pedal edema, cyanosis, or clubbing.  NEUROLOGIC: Cranial nerves II through XII  are intact. Muscle strength 5/5 in all extremities. Sensation intact. Gait not checked.  PSYCHIATRIC: The patient is alert and oriented x 3.  SKIN: No obvious rash, lesion, or ulcer.   Physical Exam LABORATORY PANEL:   CBC Recent Labs  Lab 03/14/18 0500  WBC 10.7*  HGB 11.3*  HCT 35.5*  PLT 304   ------------------------------------------------------------------------------------------------------------------  Chemistries  Recent Labs  Lab 03/16/18 0500  NA 136  K 4.2  CL 99*  CO2 28  GLUCOSE 127*  BUN 25*  CREATININE 0.53*  CALCIUM 8.7*   ------------------------------------------------------------------------------------------------------------------  Cardiac Enzymes Recent Labs  Lab 03/12/18 1233 03/12/18 1252  TROPONINI <0.03 <0.03   ------------------------------------------------------------------------------------------------------------------  RADIOLOGY:  No results found.  ASSESSMENT AND PLAN:  57 year old male with past medical history significant for traumatic brain injury, paroxysmal A. fib, systolic heart failure and diabetes presents to hospital secondary to sepsis and respiratory failure.  *AcuteSevere hypoxic and hypercapnic respiratory failure Resolved Did require ICU carew/vent management,s/ptracheostomythis admission,on trach collar,pulmonology input greatly appreciated,BTs prn,Robitussin,suctioning as needed,scopolamine patch, tracheal suctioning with saline, continue TFs,aspiration/fall precautions, and transfer toLTAC/SNF that supports trach collarwhen bed is available  *Acute dysphagia Status post PEG tube placement Continue tube feeds  *AcuteSepsis Resolved secondary to VAP Klebsiella pneumonia Treated with 14-day course of Ancef-discontinued March 11, 2018  *Acute atypical chest pain  Ruled out for acute coronary syndrome, EKG benign  *Paroxysmal atrial fibrillation Controlled ondigoxin and metoprolol not  a Candidate for anticoagulation due to recent GI bleed  *Hx of GIB recent negative endoscopy Continue PPI  *History of TBI/depression and schizophrenia Psychiatry did see patient while in-house Continue valproic acidandSeroquel  *Diabetes mellitus Stable oncurrent regiment  *Chronic stageII decubitus ulcer on buttock- Wound  care nurse consulted-continue local wound care  Still waiting for LTAC placement.  No news so far poor support her.  All the records are reviewed and case discussed with Care Management/Social Workerr. Management plans discussed with the patient, family and they are in agreement.  CODE STATUS: DNR  TOTAL TIME TAKING CARE OF THIS PATIENT: 33 minutes.   POSSIBLE D/C IN ? DAYS, DEPENDING ON CLINICAL CONDITION.   Shaune Pollack M.D on 03/18/2018   Between 7am to 6pm - Pager - [phone]  After 6pm go to [email or web address] - password [email or web address]  [facility] [place] [facility]  Office  [phone]  CC: Primary care physician; Marcina Millard, MD  Note: This dictation was prepared with Dragon dictation along with smaller phrase technology. Any transcriptional errors that result from this process are unintentional.

## 2018-03-18 NOTE — Progress Notes (Signed)
[facility]     Assessment and Plan:  Acute on hypoxic respiratory failure with copious secretions and atelectasis.  Poor airway clearance due to tracheostomy and weakness/debility, appears improved today. Pt is able to clear more secretions, pt continues to have copious thick secretions.  S/p trach and feeding tube.   Continue nebs q4 followed by suctioning.  Continue Mucomyst.  Up in chair as tolerated.  Wean down oxygen as tolerated.  [name] valve as tolerated.   Date: 03/18/2018  MRN# [id number] Tommy Mathews [birth year]   Tommy Mathews is a 57 y.o. old male seen in follow up for chief complaint of  Chief Complaint  Patient presents with  . Respiratory Distress     Synopsis The patient is a 57 year old male admitted to the hospital on 01/06/2018 for acute GI bleeding.  He had acute hypoxic respiratory failure at that time and required intubation.  His course was complicated by a severe pneumonia, atrial fibrillation, acute kidney injury, ARDS.  Over the ensuing weeks he improved, however he could not be weaned on the ventilator.  He subsequently underwent a PEG, then trach on 4/25 He was seen by cardiology, noted to have dilated cardiomyopathy, maintained on amiodarone. On 6/12 pulmonary service was reconsulted as patient has been having increasing dyspnea on the floor with copious secretions, requiring frequent suctioning.  Guaifenesin 4 times daily, duo nebs every 4 hours followed by suctioning.  Subjective  Patient appears well today, appears comfortable.  Currently on 40% nasal cannula, was increased briefly to 60% overnight, and back down to 40%.  Patient tolerated [name] valve yesterday.   MAJOR EVENTS/TEST RESULTS: 04/08 Admission as above 04/08 CT chest: Multifocal severe pneumonia with RUL collapse and soft tissue obstructing RUL bronchus. 04/08 CTAP: Mild small bowel ileus, potential enteritis. Nasogastric tube terminates in distal  stomach. Cholelithiasis without CT findings of acute cholecystitis.Consider bronchoscopy.  04/09 nephrology consultation: No acute indication for dialysis at present. 04/09 gastroenterology consultation: Recommend high-dose PPI therapy. 04/10 severely hypoxemic despite 100% FiO2.  Recruitment maneuver with significant improvement.  PEEP increased and ARDS strategy on ventilator implemented.  Neuromuscular blockade implemented due to ventilator dyssynchrony. 04/11 PAF with RVR > converted with amiodarone. Gas exchange improving. Off Nimbex 04/13 High airway pressure with vent dyssynchony; given Vecuronium; FIO2 increased to 50%  4/13 Amiodarone D/C because of SB 4/15 increased HR, EF 20% afib with RVR 4/16 remains intubated 4/17 failed weaning trials, HR remains elevated 4/23 PEG placement 4/25 tracheostomy 6/08 transfer to floor; tolerated trach collar x48 hours 06/12 Desat with copious secretions.  6/13 Doing better with airway clearance.  06/18 tolerated [name] valve, still has copious secretions  **Echocardiogram 01/10/2018; EF equals 20%, PA peak pressure equals 40 mmHg.    Medication:    Current Facility-Administered Medications:  .  acetaminophen (TYLENOL) suppository 650 mg, 650 mg, Rectal, Q4H PRN, Samaan, Maged, MD .  acetaminophen (TYLENOL) tablet 650 mg, 650 mg, Per Tube, Q4H PRN, Merwyn KatosSimonds, David B, MD, 650 mg at 03/14/18 0834 .  acetylcysteine (MUCOMYST) NICU inhalation solution 10%, 4 mL, Nebulization, Q12H PRN, Salary, Montell D, MD .  bisacodyl (DULCOLAX) suppository 10 mg, 10 mg, Rectal, Daily PRN, Merwyn KatosSimonds, David B, MD, 10 mg at 01/20/18 1048 .  budesonide (PULMICORT) nebulizer solution 0.5 mg, 0.5 mg, Nebulization, BID, Kasa, Kurian, MD, 0.5 mg at 03/18/18 0821 .  clonazePAM (KLONOPIN) tablet 0.25 mg, 0.25 mg, Oral, BID, He, Jun, MD, 0.25 mg at 03/17/18 2307 .  digoxin (LANOXIN) tablet 0.25  mg, 0.25 mg, Per Tube, Daily, Paraschos, Alexander, MD, 0.25 mg at 03/18/18  1033 .  docusate (COLACE) 50 MG/5ML liquid 100 mg, 100 mg, Per Tube, BID PRN, Merwyn Katos, MD .  docusate (COLACE) 50 MG/5ML liquid 50 mg, 50 mg, Per Tube, BID, Samaan, Maged, MD, 50 mg at 03/18/18 1033 .  enoxaparin (LOVENOX) injection 40 mg, 40 mg, Subcutaneous, Q24H, Samaan, Maged, MD, 40 mg at 03/17/18 1218 .  feeding supplement (GLUCERNA 1.5 CAL) liquid 1,000 mL, 1,000 mL, Per Tube, Continuous, Salary, Montell D, MD, Last Rate: 70 mL/hr at 03/17/18 1221, 1,000 mL at 03/17/18 1221 .  FLUoxetine (PROZAC) 20 MG/5ML solution 20 mg, 20 mg, Oral, Daily, Samaan, Maged, MD, 20 mg at 03/18/18 1033 .  free water 200 mL, 200 mL, Per Tube, Q8H, Salary, Montell D, MD, 200 mL at 03/18/18 0641 .  furosemide (LASIX) tablet 20 mg, 20 mg, Per Tube, Daily, Salary, Montell D, MD, 20 mg at 03/17/18 1219 .  gabapentin (NEURONTIN) 250 MG/5ML solution 200 mg, 200 mg, Per Tube, Q12H, Annett Fabian, MD, 200 mg at 03/17/18 2313 .  guaiFENesin (ROBITUSSIN) 100 MG/5ML solution 200 mg, 10 mL, Per Tube, QID, Oralia Manis, MD, 200 mg at 03/18/18 1032 .  hydrALAZINE (APRESOLINE) injection 10-40 mg, 10-40 mg, Intravenous, Q4H PRN, Jackson Latino A, MD .  insulin aspart (novoLOG) injection 0-20 Units, 0-20 Units, Subcutaneous, Q4H, Merwyn Katos, MD, 3 Units at 03/18/18 0356 .  insulin glargine (LANTUS) injection 20 Units, 20 Units, Subcutaneous, QHS, Conforti, John, DO, 20 Units at 03/17/18 2305 .  ipratropium-albuterol (DUONEB) 0.5-2.5 (3) MG/3ML nebulizer solution 3 mL, 3 mL, Nebulization, Q4H PRN, Gouru, Aruna, MD .  ipratropium-albuterol (DUONEB) 0.5-2.5 (3) MG/3ML nebulizer solution 3 mL, 3 mL, Nebulization, Q4H, Nicholos Johns, Salbador Fiveash, MD, 3 mL at 03/18/18 1130 .  ketoconazole (NIZORAL) 2 % cream, , Topical, Daily PRN, Eugenie Norrie, NP .  lisinopril (PRINIVIL,ZESTRIL) tablet 10 mg, 10 mg, Per Tube, Daily, Annett Fabian, MD, 10 mg at 03/17/18 1219 .  magnesium oxide (MAG-OX) tablet 400 mg, 400 mg, Per  Tube, BID, Erin Fulling, MD, 400 mg at 03/17/18 2308 .  MEDLINE mouth rinse, 15 mL, Mouth Rinse, 10 times per day, Merwyn Katos, MD, 15 mL at 03/18/18 1032 .  metoprolol tartrate (LOPRESSOR) tablet 12.5 mg, 12.5 mg, Per Tube, Q6H, Annett Fabian, MD, 12.5 mg at 03/18/18 1610 .  nystatin (MYCOSTATIN) 100000 UNIT/ML suspension 500,000 Units, 5 mL, Oral, QID, Alford Highland, MD, 500,000 Units at 03/18/18 1032 .  oxyCODONE-acetaminophen (PERCOCET/ROXICET) 5-325 MG per tablet 1 tablet, 1 tablet, Per Tube, Q6H PRN, Ihor Austin, MD, 1 tablet at 03/14/18 0542 .  pantoprazole sodium (PROTONIX) 40 mg/20 mL oral suspension 40 mg, 40 mg, Per Tube, Daily, Merwyn Katos, MD, 40 mg at 03/17/18 1220 .  QUEtiapine (SEROQUEL) tablet 200 mg, 200 mg, Per Tube, QHS, Enedina Finner, MD, 200 mg at 03/17/18 2307 .  QUEtiapine (SEROQUEL) tablet 400 mg, 400 mg, Oral, Daily, Enid Baas, MD, 400 mg at 03/18/18 1033 .  scopolamine (TRANSDERM-SCOP) 1 MG/3DAYS 1.5 mg, 1 patch, Transdermal, Q72H, Salary, Montell D, MD, 1.5 mg at 03/16/18 1152 .  sodium chloride flush (NS) 0.9 % injection 10-40 mL, 10-40 mL, Intracatheter, PRN, Merwyn Katos, MD, 40 mL at 03/14/18 2013 .  sodium chloride HYPERTONIC 3 % nebulizer solution 4 mL, 4 mL, Nebulization, Daily, Salary, Montell D, MD, 15 mL at 03/18/18 0821 .  valproic acid (DEPAKENE) solution 500  mg, 500 mg, Per Tube, BID, Samaan, Maged, MD, 500 mg at 03/18/18 1034   Allergies:  Patient has no known allergies.  Review of Systems: Could not provide ROS due to TBI.   Physical Examination:   VS: BP 134/88   Pulse 81   Temp 98.7 F (37.1 C) (Oral)   Resp 20   Ht 5\' 10"  (1.778 m)   Wt 214 lb (97.1 kg)   SpO2 96%   BMI 30.71 kg/m    General Appearance: No distress, feels better.   Neuro:without focal findings, nonverbal HEENT: PERRLA, EOM intact.  Tracheostomy in place Pulmonary: Continued scattered bilateral rhonchi, R>L.   CardiovascularNormal S1,S2.   No m/r/g.   Abdomen: Benign, Soft, non-tender. Renal:  No costovertebral tenderness  GU:  Not performed at this time. Endoc: No evident thyromegaly, no signs of acromegaly. Skin:   warm, no rash. Extremities: normal, no cyanosis, clubbing.   LABORATORY PANEL:   CBC Recent Labs  Lab 03/14/18 0500  WBC 10.7*  HGB 11.3*  HCT 35.5*  PLT 304   ------------------------------------------------------------------------------------------------------------------  Chemistries  Recent Labs  Lab 03/16/18 0500  NA 136  K 4.2  CL 99*  CO2 28  GLUCOSE 127*  BUN 25*  CREATININE 0.53*  CALCIUM 8.7*   ------------------------------------------------------------------------------------------------------------------  Cardiac Enzymes Recent Labs  Lab 03/12/18 1252  TROPONINI <0.03   ------------------------------------------------------------  RADIOLOGY:   No results found for this or any previous visit. No results found for this or any previous visit. ------------------------------------------------------------------------------------------------------------------  Thank  you for allowing [facility] to assist in the care of your patient. Our recommendations are noted above.  Please contact us if we can be of further service.   Wells Guiles, MD.  [facility] Office Number: [phone]  Santiago Glad, M.D.  Billy Fischer, M.D  03/18/2018

## 2018-03-19 LAB — GLUCOSE, CAPILLARY
GLUCOSE-CAPILLARY: 115 mg/dL — AB (ref 65–99)
Glucose-Capillary: 112 mg/dL — ABNORMAL HIGH (ref 65–99)
Glucose-Capillary: 116 mg/dL — ABNORMAL HIGH (ref 65–99)
Glucose-Capillary: 167 mg/dL — ABNORMAL HIGH (ref 65–99)
Glucose-Capillary: 175 mg/dL — ABNORMAL HIGH (ref 65–99)

## 2018-03-19 MED ORDER — FREE WATER: 200.0000 mL | Freq: Three times a day (TID) | Status: DC

## 2018-03-19 MED ORDER — GABAPENTIN 250 MG/5ML PO SOLN: 200.0000 mg | Freq: Two times a day (BID) | ORAL | 12 refills | Status: DC

## 2018-03-19 MED ORDER — IPRATROPIUM-ALBUTEROL 0.5-2.5 (3) MG/3ML IN SOLN: 3.0000 mL | RESPIRATORY_TRACT | Status: DC

## 2018-03-19 MED ORDER — LISINOPRIL 10 MG PO TABS: 10.0000 mg | ORAL_TABLET | Freq: Every day | ORAL | Status: DC

## 2018-03-19 MED ORDER — SCOPOLAMINE 1 MG/3DAYS TD PT72: 1.0000 | MEDICATED_PATCH | TRANSDERMAL | 12 refills | Status: DC

## 2018-03-19 MED ORDER — ORAL CARE MOUTH RINSE: 15.0000 mL | Freq: Every day | OROMUCOSAL | 0 refills | Status: DC

## 2018-03-19 MED ORDER — FLUOXETINE HCL 20 MG/5ML PO SOLN: 20.0000 mg | Freq: Every day | ORAL | 0 refills | Status: DC

## 2018-03-19 MED ORDER — DOCUSATE SODIUM 50 MG/5ML PO LIQD: 50.0000 mg | Freq: Two times a day (BID) | ORAL | 0 refills | Status: DC

## 2018-03-19 MED ORDER — NYSTATIN 100000 UNIT/ML MT SUSP: 5.0000 mL | Freq: Four times a day (QID) | OROMUCOSAL | 0 refills | Status: DC

## 2018-03-19 MED ORDER — INSULIN GLARGINE 100 UNIT/ML ~~LOC~~ SOLN: 20.0000 [IU] | Freq: Every day | SUBCUTANEOUS | 11 refills | Status: DC

## 2018-03-19 MED ORDER — SODIUM CHLORIDE 3 % IN NEBU: 4.0000 mL | INHALATION_SOLUTION | Freq: Every day | RESPIRATORY_TRACT | 12 refills | Status: DC

## 2018-03-19 MED ORDER — MAGNESIUM OXIDE 400 (241.3 MG) MG PO TABS: 400.0000 mg | ORAL_TABLET | Freq: Two times a day (BID) | ORAL | Status: DC

## 2018-03-19 MED ORDER — CLONAZEPAM 0.5 MG PO TABS: 0.2500 mg | ORAL_TABLET | Freq: Two times a day (BID) | ORAL | 0 refills | Status: DC

## 2018-03-19 MED ORDER — METOPROLOL TARTRATE 25 MG PO TABS: 12.5000 mg | ORAL_TABLET | Freq: Four times a day (QID) | ORAL | Status: DC

## 2018-03-19 MED ORDER — BISACODYL 10 MG RE SUPP: 10.0000 mg | Freq: Every day | RECTAL | 0 refills | Status: DC | PRN

## 2018-03-19 MED ORDER — KETOCONAZOLE 2 % EX CREA: TOPICAL_CREAM | Freq: Every day | CUTANEOUS | 0 refills | Status: DC | PRN

## 2018-03-19 MED ORDER — FUROSEMIDE 20 MG PO TABS: 20.0000 mg | ORAL_TABLET | Freq: Every day | ORAL | Status: DC

## 2018-03-19 MED ORDER — BUDESONIDE 0.5 MG/2ML IN SUSP: 0.5000 mg | Freq: Two times a day (BID) | RESPIRATORY_TRACT | 12 refills | Status: DC

## 2018-03-19 MED ORDER — QUETIAPINE FUMARATE 200 MG PO TABS: 200.0000 mg | ORAL_TABLET | Freq: Every day | ORAL | Status: DC

## 2018-03-19 MED ORDER — IPRATROPIUM-ALBUTEROL 0.5-2.5 (3) MG/3ML IN SOLN: 3.0000 mL | RESPIRATORY_TRACT | Status: DC | PRN

## 2018-03-19 MED ORDER — DIGOXIN 250 MCG PO TABS: 0.2500 mg | ORAL_TABLET | Freq: Every day | ORAL | Status: DC

## 2018-03-19 MED ORDER — VALPROIC ACID 250 MG/5ML PO SOLN: 500.0000 mg | Freq: Two times a day (BID) | ORAL | Status: DC

## 2018-03-19 MED ORDER — QUETIAPINE FUMARATE 400 MG PO TABS: 400.0000 mg | ORAL_TABLET | Freq: Every day | ORAL | Status: DC

## 2018-03-19 MED ORDER — PANTOPRAZOLE SODIUM 40 MG PO PACK: 40.0000 mg | PACK | Freq: Every day | ORAL | Status: DC

## 2018-03-19 MED ORDER — ACETYLCYSTEINE 10% NICU INHALATION SOLUTION: 4.0000 mL | Freq: Two times a day (BID) | RESPIRATORY_TRACT | Status: DC | PRN

## 2018-03-19 MED ORDER — DOCUSATE SODIUM 50 MG/5ML PO LIQD: 100.0000 mg | Freq: Two times a day (BID) | ORAL | 0 refills | Status: DC | PRN

## 2018-03-19 MED ORDER — GLUCERNA 1.5 CAL PO LIQD: 1000.0000 mL | ORAL | Status: DC

## 2018-03-19 MED ORDER — GUAIFENESIN 100 MG/5ML PO SOLN: 10.0000 mL | Freq: Four times a day (QID) | ORAL | 0 refills | Status: DC

## 2018-03-19 NOTE — Progress Notes (Signed)
Patient discharged to SNF per MD order. Report called to [facility] at facility. Foley changed out. EMS called for transport.

## 2018-03-19 NOTE — Progress Notes (Signed)
Unwitnessed descent to floor around [phone]. Staff entered patient room and found him supine with head resting against the recliner. Pt was assessed for injuries. There were no apparent injuries.Vital signs were obtained. Dr Sheryle Hailiamond notified to see if he wanted additional test. No new orders were given. AC notified -Cheryl. She suggested maybe placing a telesitter. Director Carollee HerterShannon Wineman notified. Post huddle performed by all night shift staff. SZP submitted. Pt already in front of nurses station. Items placed within his reach. Chair alarm placed in bed, bed does not have alarm. Telephone call placed to pt mother Fatima Sangerlizabeth Conkey- no answer. Message left. Post fall flowsheet initiatied Will continue to monitor.

## 2018-03-19 NOTE — Progress Notes (Signed)
[facility] [place] [facility]   PATIENT NAME: Tommy Mathews    MR#:  [id number]  DATE OF BIRTH:  Jun 22, 1961  SUBJECTIVE:  CHIEF COMPLAINT:   Chief Complaint  Patient presents with  . Respiratory Distress  Patient fell this am but no injury. On trach collar with O2 40%. No complaints. REVIEW OF SYSTEMS:  CONSTITUTIONAL: No fever, fatigue or weakness.  EYES: No blurred or double vision.  EARS, NOSE, AND THROAT: No tinnitus or ear pain.  RESPIRATORY: No cough, shortness of breath, wheezing or hemoptysis.  CARDIOVASCULAR: No chest pain, orthopnea, edema.  GASTROINTESTINAL: No nausea, vomiting, diarrhea or abdominal pain.  GENITOURINARY: No dysuria, hematuria.  ENDOCRINE: No polyuria, nocturia,  HEMATOLOGY: No anemia, easy bruising or bleeding SKIN: No rash or lesion. MUSCULOSKELETAL: No joint pain or arthritis.   NEUROLOGIC: No tingling, numbness, weakness.  PSYCHIATRY: No anxiety or depression.   ROS  DRUG ALLERGIES:  No Known Allergies  VITALS:  Blood pressure 130/80, pulse 91, temperature 98.1 F (36.7 C), temperature source Oral, resp. rate 18, height 5\' 10"  (1.778 m), weight 216 lb (98 kg), SpO2 97 %.  PHYSICAL EXAMINATION:  GENERAL:  57 y.o.-year-old patient lying in the bed with no acute distress.  EYES: Pupils equal, round, reactive to light and accommodation. No scleral icterus. Extraocular muscles intact.  HEENT: Head atraumatic, normocephalic. On trach collar.  NECK:  Supple, no jugular venous distention. No thyroid enlargement, no tenderness.  LUNGS: Normal breath sounds bilaterally, no wheezing, rales,rhonchi or crepitation. No use of accessory muscles of respiration.  CARDIOVASCULAR: S1, S2 normal. No murmurs, rubs, or gallops.  ABDOMEN: Soft, nontender, nondistended. Bowel sounds present. No organomegaly or mass.  EXTREMITIES: No pedal edema, cyanosis, or clubbing.  NEUROLOGIC: Cranial nerves II through XII are intact. Muscle strength  4/5 in all extremities. Sensation intact. Gait not checked.  PSYCHIATRIC: The patient is alert and oriented x 3.  SKIN: No obvious rash, lesion, or ulcer.   Physical Exam LABORATORY PANEL:   CBC Recent Labs  Lab 03/14/18 0500  WBC 10.7*  HGB 11.3*  HCT 35.5*  PLT 304   ------------------------------------------------------------------------------------------------------------------  Chemistries  Recent Labs  Lab 03/16/18 0500  NA 136  K 4.2  CL 99*  CO2 28  GLUCOSE 127*  BUN 25*  CREATININE 0.53*  CALCIUM 8.7*   ------------------------------------------------------------------------------------------------------------------  Cardiac Enzymes Recent Labs  Lab 03/12/18 1252  TROPONINI <0.03   ------------------------------------------------------------------------------------------------------------------  RADIOLOGY:  No results found.  ASSESSMENT AND PLAN:  57 year old male with past medical history significant for traumatic brain injury, paroxysmal A. fib, systolic heart failure and diabetes presents to hospital secondary to sepsis and respiratory failure.  *AcuteSevere hypoxic and hypercapnic respiratory failure Resolved Did require ICU carew/vent management,s/ptracheostomythis admission,on trach collar,pulmonology input greatly appreciated,BTs prn,Robitussin,suctioning as needed,scopolamine patch, tracheal suctioning with saline, continue TFs,aspiration/fall precautions, and transfer toLTAC/SNF that supports trach collarwhen bed is available  *Acute dysphagia  Status post PEG tube placement Continue tube feeds  *AcuteSepsis Resolved secondary to VAP Klebsiella pneumonia Treated with 14-day course of Ancef-discontinued March 11, 2018  *Acute atypical chest pain  Ruled out for acute coronary syndrome, EKG benign  *Paroxysmal atrial fibrillation Controlled ondigoxin and metoprolol not a Candidate for anticoagulation due to recent  GI bleed  *Hx of GIB recent negative endoscopy Continue PPI  *History of TBI/depression and schizophrenia Psychiatry did see patient while in-house Continue valproic acidandSeroquel  *Diabetes mellitus Stable oncurrent regiment  *Chronic stageII decubitus ulcer on buttock- Wound care nurse consulted-continue  local wound care  Still waiting for long term care facility placement.  All the records are reviewed and case discussed with Care Management/Social Workerr. Management plans discussed with the patient, family and they are in agreement.  CODE STATUS: DNR  TOTAL TIME TAKING CARE OF THIS PATIENT: 28 minutes.   POSSIBLE D/C IN ? DAYS, DEPENDING ON CLINICAL CONDITION.   Tommy PollackQing Tommy Mathews M.D on 03/19/2018   Between 7am to 6pm - Pager - [phone]  After 6pm go to [email or web address] - password [email or web address]  [facility]  Office  [phone]  CC: Primary care physician; Tommy MillardParaschos, Alexander, MD  Note: This dictation was prepared with Dragon dictation along with smaller phrase technology. Any transcriptional errors that result from this process are unintentional.

## 2018-03-19 NOTE — Progress Notes (Signed)
OT Cancellation Note  Patient Details Name: Tommy Mathews MRN: [id number] DOB: 13-Feb-1961   Cancelled Treatment:    Reason Eval/Treat Not Completed: Other (comment). Upon attempt, pt preparing for discharge to SNF. Unavailable for therapy. Will re-attempt next date if for some reason pt does not discharge as planned this afternoon.   Richrd PrimeJamie Stiller, MPH, MS, OTR/L ascom [phone] 03/19/18, 3:46 PM

## 2018-03-19 NOTE — Discharge Summary (Signed)
[employer] - [place] [facility]   PATIENT NAME: Tommy Mathews    MR#:  [id number]  DATE OF BIRTH:  04/22/1961  DATE OF ADMISSION:  01/06/2018   ADMITTING PHYSICIAN: Oralia Manis, MD  DATE OF DISCHARGE: 03/19/2018 PRIMARY CARE PHYSICIAN: Marcina Millard, MD   ADMISSION DIAGNOSIS:  Shock (HCC) [R57.9] Sepsis, due to unspecified organism [facility]) [A41.9] Acute respiratory failure with hypoxemia (HCC) [J96.01] DISCHARGE DIAGNOSIS:  Principal Problem:   Schizophrenia (HCC) Active Problems:   GI bleed   Acute respiratory failure with hypoxia (HCC)   Severe sepsis with septic shock (HCC)   Multifocal pneumonia   UTI (urinary tract infection)   AKI (acute kidney injury) (HCC)   Ileus (HCC)   Diabetes (HCC)   Pressure injury of skin   Severe recurrent major depression without psychotic features (HCC)   Bipolar I disorder, most recent episode depressed (HCC)   Acute respiratory failure (HCC)  SECONDARY DIAGNOSIS:   Past Medical History:  Diagnosis Date  . Diabetes (HCC)   . GERD (gastroesophageal reflux disease)   . HLD (hyperlipidemia)   . HTN (hypertension)    HOSPITAL COURSE:   57 year old male with past medical history significant for traumatic brain injury, paroxysmal A. fib, systolic heart failure and diabetes presents to hospital secondary to sepsis and respiratory failure.  *AcuteSevere hypoxic and hypercapnic respiratory failure Resolved. Did require ICU carew/vent management,s/ptracheostomythis admission,on trach collar,pulmonology input greatly appreciated,BTs prn,Robitussin,suctioning as needed,scopolamine patch, tracheal suctioning with saline,continue TFs,aspiration/fall precautions, and transfer toLTAC/SNF that supports trach collarwhen bed is available. Need frequent suction for secretion.  *Acute dysphagia  Status post PEG tube placement Continue tube feeds  *AcuteSepsis Resolved secondary to VAP Klebsiella  pneumonia Treated with 14-day course of Ancef-discontinued March 11, 2018  *Acute atypical chest pain  Ruled out for acute coronary syndrome, EKG benign  *Paroxysmal atrial fibrillation Controlled ondigoxin and metoprolol not a Candidate for anticoagulation due to recent GI bleed  *Hx of GIB recent negative endoscopy Continue PPI  *History of TBI/depression and schizophrenia Psychiatry did see patient while in-house Continue valproic acidandSeroquel  *Diabetes mellitus Stable oncurrent regiment  *Chronic stageII decubitus ulcer on buttock- Wound care nurse consulted-continue local wound care DISCHARGE CONDITIONS:  Stable, discharge to skilled nursing facility today. CONSULTS OBTAINED:  Treatment Team:  Shari Prows, MD Clapacs, Jackquline Denmark, MD He, Jun, MD Shane Crutch, MD DRUG ALLERGIES:  No Known Allergies DISCHARGE MEDICATIONS:   Allergies as of 03/19/2018   No Known Allergies     Medication List    STOP taking these medications   atorvastatin 80 MG tablet Commonly known as:  LIPITOR   divalproex 500 MG 24 hr tablet Commonly known as:  DEPAKOTE ER   estradiol 2 MG tablet Commonly known as:  ESTRACE   Fish Oil 1000 MG Caps   FLUoxetine 40 MG capsule Commonly known as:  PROZAC Replaced by:  FLUoxetine 20 MG/5ML solution   folic acid 800 MCG tablet Commonly known as:  FOLVITE   hydrochlorothiazide 50 MG tablet Commonly known as:  HYDRODIURIL   metFORMIN 1000 MG tablet Commonly known as:  GLUCOPHAGE   methotrexate 2.5 MG tablet Commonly known as:  RHEUMATREX   ranitidine 150 MG tablet Commonly known as:  ZANTAC   rivaroxaban 10 MG Tabs tablet Commonly known as:  XARELTO   senna-docusate 8.6-50 MG tablet Commonly known as:  Senokot-S   sodium chloride 1 g tablet     TAKE these medications   acetylcysteine 10% Soln Commonly  known as:  MUCOMYST Take 4 mLs by nebulization every 12 (twelve) hours as needed  (secretions).   bisacodyl 10 MG suppository Commonly known as:  DULCOLAX Place 1 suppository (10 mg total) rectally daily as needed for moderate constipation.   budesonide 0.5 MG/2ML nebulizer solution Commonly known as:  PULMICORT Take 2 mLs (0.5 mg total) by nebulization 2 (two) times daily.   clonazePAM 0.5 MG tablet Commonly known as:  KLONOPIN Take 0.5 tablets (0.25 mg total) by mouth 2 (two) times daily.   digoxin 0.25 MG tablet Commonly known as:  LANOXIN Place 1 tablet (0.25 mg total) into feeding tube daily. Start taking on:  03/20/2018   docusate 50 MG/5ML liquid Commonly known as:  COLACE Place 10 mLs (100 mg total) into feeding tube 2 (two) times daily as needed for mild constipation.   docusate 50 MG/5ML liquid Commonly known as:  COLACE Place 5 mLs (50 mg total) into feeding tube 2 (two) times daily.   feeding supplement (GLUCERNA 1.5 CAL) Liqd Place 1,000 mLs into feeding tube continuous.   FLUoxetine 20 MG/5ML solution Commonly known as:  PROZAC Place 5 mLs (20 mg total) into feeding tube daily. Start taking on:  03/20/2018 Replaces:  FLUoxetine 40 MG capsule   free water Soln Place 200 mLs into feeding tube every 8 (eight) hours.   furosemide 20 MG tablet Commonly known as:  LASIX Place 1 tablet (20 mg total) into feeding tube daily. Start taking on:  03/20/2018   gabapentin 250 MG/5ML solution Commonly known as:  NEURONTIN Place 4 mLs (200 mg total) into feeding tube every 12 (twelve) hours.   guaiFENesin 100 MG/5ML Soln Commonly known as:  ROBITUSSIN Place 10 mLs (200 mg total) into feeding tube 4 (four) times daily.   insulin glargine 100 UNIT/ML injection Commonly known as:  LANTUS Inject 0.2 mLs (20 Units total) into the skin at bedtime.   ipratropium-albuterol 0.5-2.5 (3) MG/3ML Soln Commonly known as:  DUONEB Take 3 mLs by nebulization every 4 (four) hours as needed.   ipratropium-albuterol 0.5-2.5 (3) MG/3ML Soln Commonly known as:   DUONEB Take 3 mLs by nebulization every 4 (four) hours.   ketoconazole 2 % cream Commonly known as:  NIZORAL Apply topically daily as needed for irritation.   lisinopril 10 MG tablet Commonly known as:  PRINIVIL,ZESTRIL Place 1 tablet (10 mg total) into feeding tube daily. Please hold for BP < 130 mmhg Start taking on:  03/20/2018 What changed:    medication strength  how much to take  how to take this  additional instructions   magnesium oxide 400 (241.3 Mg) MG tablet Commonly known as:  MAG-OX Place 1 tablet (400 mg total) into feeding tube 2 (two) times daily.   metoprolol tartrate 25 MG tablet Commonly known as:  LOPRESSOR Place 0.5 tablets (12.5 mg total) into feeding tube every 6 (six) hours. Please hold for BP < 130 mmhg What changed:    medication strength  how much to take  how to take this  when to take this  additional instructions   mouth rinse Liqd solution 15 mLs by Mouth Rinse route daily. 10 times per day.   nystatin 100000 UNIT/ML suspension Commonly known as:  MYCOSTATIN Take 5 mLs (500,000 Units total) by mouth 4 (four) times daily.   pantoprazole sodium 40 mg/20 mL Pack Commonly known as:  PROTONIX Place 20 mLs (40 mg total) into feeding tube daily. Start taking on:  03/20/2018   QUEtiapine 200 MG tablet  Commonly known as:  SEROQUEL Place 1 tablet (200 mg total) into feeding tube at bedtime. What changed:    medication strength  how much to take  how to take this  when to take this   QUEtiapine 400 MG tablet Commonly known as:  SEROQUEL Place 1 tablet (400 mg total) into feeding tube daily. Start taking on:  03/20/2018 What changed:  You were already taking a medication with the same name, and this prescription was added. Make sure you understand how and when to take each.   scopolamine 1 MG/3DAYS Commonly known as:  TRANSDERM-SCOP Place 1 patch (1.5 mg total) onto the skin every 3 (three) days. Start taking on:  03/22/2018     sodium chloride HYPERTONIC 3 % nebulizer solution Take 4 mLs by nebulization daily. Start taking on:  03/20/2018   valproic acid 250 MG/5ML Soln solution Commonly known as:  DEPAKENE Place 10 mLs (500 mg total) into feeding tube 2 (two) times daily.        DISCHARGE INSTRUCTIONS:  See AVS.  If you experience worsening of your admission symptoms, develop shortness of breath, life threatening emergency, suicidal or homicidal thoughts you must seek medical attention immediately by calling 911 or calling your MD immediately  if symptoms less severe.  You Must read complete instructions/literature along with all the possible adverse reactions/side effects for all the Medicines you take and that have been prescribed to you. Take any new Medicines after you have completely understood and accpet all the possible adverse reactions/side effects.   Please note  You were cared for by a hospitalist during your hospital stay. If you have any questions about your discharge medications or the care you received while you were in the hospital after you are discharged, you can call the unit and asked to speak with the hospitalist on call if the hospitalist that took care of you is not available. Once you are discharged, your primary care physician will handle any further medical issues. Please note that NO REFILLS for any discharge medications will be authorized once you are discharged, as it is imperative that you return to your primary care physician (or establish a relationship with a primary care physician if you do not have one) for your aftercare needs so that they can reassess your need for medications and monitor your lab values.    On the day of Discharge:  VITAL SIGNS:  Blood pressure 130/80, pulse 91, temperature 98.1 F (36.7 C), temperature source Oral, resp. rate 18, height 5\' 10"  (1.778 m), weight 216 lb (98 kg), SpO2 97 %. PHYSICAL EXAMINATION:  GENERAL:  57 y.o.-year-old patient lying in  the bed with no acute distress.  EYES: Pupils equal, round, reactive to light and accommodation. No scleral icterus. Extraocular muscles intact.  HEENT: Head atraumatic, normocephalic.on trach collar. NECK:  Supple, no jugular venous distention. No thyroid enlargement, no tenderness.  LUNGS: Normal breath sounds bilaterally, no wheezing, rales,rhonchi or crepitation. No use of accessory muscles of respiration.  CARDIOVASCULAR: S1, S2 normal. No murmurs, rubs, or gallops.  ABDOMEN: Soft, non-tender, non-distended. Bowel sounds present. No organomegaly or mass. PEG in situ.  EXTREMITIES: No pedal edema, cyanosis, or clubbing.  NEUROLOGIC: Cranial nerves II through XII are intact. Muscle strength 4/5 in all extremities. Sensation intact. Gait not checked.  PSYCHIATRIC: The patient is alert and oriented x 3.  SKIN: No obvious rash, lesion, or ulcer.  DATA REVIEW:   CBC Recent Labs  Lab 03/14/18 0500  WBC 10.7*  HGB 11.3*  HCT 35.5*  PLT 304    Chemistries  Recent Labs  Lab 03/16/18 0500  NA 136  K 4.2  CL 99*  CO2 28  GLUCOSE 127*  BUN 25*  CREATININE 0.53*  CALCIUM 8.7*     Microbiology Results  Results for orders placed or performed during the hospital encounter of 01/06/18  Blood Culture (routine x 2)     Status: None   Collection Time: 01/06/18  7:44 PM  Result Value Ref Range Status   Specimen Description BLOOD BLOOD LEFT WRIST  Final   Special Requests   Final    BOTTLES DRAWN AEROBIC AND ANAEROBIC Blood Culture adequate volume   Culture   Final    NO GROWTH 5 DAYS Performed at [facility], [address], [place], [place] [address]    Report Status 01/11/2018 FINAL  Final  Urine culture     Status: Abnormal   Collection Time: 01/06/18  7:44 PM  Result Value Ref Range Status   Specimen Description   Final    URINE, RANDOM Performed at [facility], [address] [place], [place] [address]    Special Requests   Final     NONE Performed at [facility], [address], [place], [place] [address]    Culture (A)  Final    10,000 COLONIES/mL STAPHYLOCOCCUS SPECIES (COAGULASE NEGATIVE) CALL MICROBIOLOGY LAB IF SENSITIVITIES ARE REQUIRED. Performed at [facility], [address], [place], [place] [address]    Report Status 01/08/2018 FINAL  Final  MRSA PCR Screening     Status: Abnormal   Collection Time: 01/06/18 11:51 PM  Result Value Ref Range Status   MRSA by PCR POSITIVE (A) NEGATIVE Final    Comment:        The GeneXpert MRSA Assay (FDA approved for NASAL specimens only), is one component of a comprehensive MRSA colonization surveillance program. It is not intended to diagnose MRSA infection nor to guide or monitor treatment for MRSA infections. RESULT CALLED TO, READ BACK BY AND VERIFIED WITH: BETH BUONO 01/07/18 @ 0121  MLK   Performed at [facility], [address] [place], [place] [address]   Blood Culture (routine x 2)     Status: None   Collection Time: 01/07/18  7:19 AM  Result Value Ref Range Status   Specimen Description BLOOD RIGHT HAND  Final   Special Requests   Final    BOTTLES DRAWN AEROBIC AND ANAEROBIC Blood Culture results may not be optimal due to an inadequate volume of blood received in culture bottles   Culture   Final    NO GROWTH 5 DAYS Performed at [facility], [address], [place], [place] [address]    Report Status 01/12/2018 FINAL  Final  Culture, respiratory (NON-Expectorated)     Status: None   Collection Time: 01/07/18 12:28 PM  Result Value Ref Range Status   Specimen Description   Final    TRACHEAL ASPIRATE Performed at [facility], [address], [place], [place] [address]    Special Requests   Final    Normal Performed at [facility], [address] [place], [place] [address]    Gram Stain   Final    MODERATE WBC PRESENT,BOTH PMN AND MONONUCLEAR RARE GRAM POSITIVE COCCI RARE YEAST     Culture   Final    Consistent with normal respiratory flora. Performed at [facility], [address] [place], [place] [address]    Report Status 01/09/2018 FINAL  Final  Culture, expectorated sputum-assessment     Status: None   Collection Time: 01/18/18  5:07 PM  Result Value Ref Range Status   Specimen Description ENDOTRACHEAL  Final   Special Requests NONE  Final   Sputum evaluation   Final    THIS SPECIMEN IS ACCEPTABLE FOR SPUTUM CULTURE Performed at [facility], [address] [place], [place] [address]    Report Status 01/18/2018 FINAL  Final  Culture, respiratory (NON-Expectorated)     Status: None   Collection Time: 01/18/18  5:07 PM  Result Value Ref Range Status   Specimen Description   Final    ENDOTRACHEAL Performed at [facility], [address] [place], [place] [address]    Special Requests   Final    NONE Reflexed from [id number] Performed at [facility], [address] [place], [place] [address]    Gram Stain   Final    MODERATE WBC PRESENT,BOTH PMN AND MONONUCLEAR NO SQUAMOUS EPITHELIAL CELLS SEEN RARE BUDDING YEAST SEEN    Culture   Final    RARE Consistent with normal respiratory flora. Performed at [facility], [address] [place], [place] [address]    Report Status 01/21/2018 FINAL  Final  MRSA PCR Screening     Status: Abnormal   Collection Time: 01/18/18  8:26 PM  Result Value Ref Range Status   MRSA by PCR POSITIVE (A) NEGATIVE Final    Comment:        The GeneXpert MRSA Assay (FDA approved for NASAL specimens only), is one component of a comprehensive MRSA colonization surveillance program. It is not intended to diagnose MRSA infection nor to guide or monitor treatment for MRSA infections. RESULT CALLED TO, READ BACK BY AND VERIFIED WITH: ANGELA BREHUN AT 2200 ON 01/18/18 RWW Performed at [facility], [address] [place], [place] [address]   CULTURE, BLOOD (ROUTINE X 2)  w Reflex to ID Panel     Status: Abnormal   Collection Time: 01/31/18  8:17 AM  Result Value Ref Range Status   Specimen Description   Final    BLOOD LEFT HAND Performed at [facility], [address] [place], [place] [address]    Special Requests   Final    BOTTLES DRAWN AEROBIC AND ANAEROBIC Blood Culture results may not be optimal due to an excessive volume of blood received in culture bottles Performed at [facility], [address] [place], [place] [address]    Culture  Setup Time   Final    GRAM NEGATIVE RODS AEROBIC BOTTLE ONLY CRITICAL RESULT CALLED TO, READ BACK BY AND VERIFIED WITH: DAVID BESANTI ON 02/05/18 AT 0228 JAG Performed at [facility], [address], [place], [place] [address]    Culture KLEBSIELLA PNEUMONIAE (A)  Final   Report Status 02/07/2018 FINAL  Final   Organism ID, Bacteria KLEBSIELLA PNEUMONIAE  Final      Susceptibility   Klebsiella pneumoniae - MIC*    AMPICILLIN >=32 RESISTANT Resistant     CEFAZOLIN <=4 SENSITIVE Sensitive     CEFEPIME <=1 SENSITIVE Sensitive     CEFTAZIDIME <=1 SENSITIVE Sensitive     CEFTRIAXONE <=1 SENSITIVE Sensitive     CIPROFLOXACIN <=0.25 SENSITIVE Sensitive     GENTAMICIN <=1 SENSITIVE Sensitive     IMIPENEM <=0.25 SENSITIVE Sensitive     TRIMETH/SULFA <=20 SENSITIVE Sensitive  AMPICILLIN/SULBACTAM >=32 RESISTANT Resistant     PIP/TAZO 16 SENSITIVE Sensitive     Extended ESBL NEGATIVE Sensitive     * KLEBSIELLA PNEUMONIAE  Blood Culture ID Panel (Reflexed)     Status: Abnormal   Collection Time: 01/31/18  8:17 AM  Result Value Ref Range Status   Enterococcus species NOT DETECTED NOT DETECTED Final   Listeria monocytogenes NOT DETECTED NOT DETECTED Final   Staphylococcus species NOT DETECTED NOT DETECTED Final   Staphylococcus aureus NOT DETECTED NOT DETECTED Final   Streptococcus species NOT DETECTED NOT DETECTED Final   Streptococcus agalactiae NOT DETECTED NOT DETECTED Final    Streptococcus pneumoniae NOT DETECTED NOT DETECTED Final   Streptococcus pyogenes NOT DETECTED NOT DETECTED Final   Acinetobacter baumannii NOT DETECTED NOT DETECTED Final   Enterobacteriaceae species DETECTED (A) NOT DETECTED Final    Comment: Enterobacteriaceae represent a large family of gram-negative bacteria, not a single organism. CRITICAL RESULT CALLED TO, READ BACK BY AND VERIFIED WITH: DAVID BESANTI ON 02/05/18 AT 0228 JAG    Enterobacter cloacae complex NOT DETECTED NOT DETECTED Final   Escherichia coli NOT DETECTED NOT DETECTED Final   Klebsiella oxytoca NOT DETECTED NOT DETECTED Final   Klebsiella pneumoniae DETECTED (A) NOT DETECTED Final    Comment: CRITICAL RESULT CALLED TO, READ BACK BY AND VERIFIED WITH: DAVID BESANTI ON 02/05/18 AT 0228 JAG    Proteus species NOT DETECTED NOT DETECTED Final   Serratia marcescens NOT DETECTED NOT DETECTED Final   Carbapenem resistance NOT DETECTED NOT DETECTED Final   Haemophilus influenzae NOT DETECTED NOT DETECTED Final   Neisseria meningitidis NOT DETECTED NOT DETECTED Final   Pseudomonas aeruginosa NOT DETECTED NOT DETECTED Final   Candida albicans NOT DETECTED NOT DETECTED Final   Candida glabrata NOT DETECTED NOT DETECTED Final   Candida krusei NOT DETECTED NOT DETECTED Final   Candida parapsilosis NOT DETECTED NOT DETECTED Final   Candida tropicalis NOT DETECTED NOT DETECTED Final    Comment: Performed at [facility], [address], [place], [place] [address]  Urine Culture     Status: None   Collection Time: 01/31/18  8:30 AM  Result Value Ref Range Status   Specimen Description   Final    URINE, RANDOM Performed at [facility], [address] [place], [place] [address]    Special Requests   Final    NONE Performed at [facility], [address], [place], [place] [address]    Culture   Final    NO GROWTH Performed at [facility], [address], [place], [place] [address]     Report Status 02/01/2018 FINAL  Final  CULTURE, BLOOD (ROUTINE X 2) w Reflex to ID Panel     Status: Abnormal   Collection Time: 01/31/18  8:53 AM  Result Value Ref Range Status   Specimen Description   Final    BLOOD BLOOD RIGHT HAND Performed at [facility], [address], [place], [place] [address]    Special Requests   Final    BOTTLES DRAWN AEROBIC AND ANAEROBIC Blood Culture adequate volume Performed at [facility], [address], [place], [place] [address]    Culture  Setup Time   Final    GRAM NEGATIVE RODS AEROBIC BOTTLE ONLY CRITICAL RESULT CALLED TO, READ BACK BY AND VERIFIED WITH: DAVID BESANTI AT 0340 ON 02/05/18 [facility]. Performed at [facility], [address], [place], [place] [address]    Culture (A)  Final    KLEBSIELLA PNEUMONIAE SUSCEPTIBILITIES PERFORMED ON PREVIOUS CULTURE WITHIN THE LAST 5 DAYS. Performed at [facility], [address] [place], [place] [address]    Report Status 02/07/2018 FINAL  Final  Culture, respiratory (NON-Expectorated)     Status: None   Collection Time: 01/31/18 10:47 AM  Result Value Ref Range Status   Specimen Description   Final    TRACHEAL ASPIRATE Performed at [facility], [address] [place], [place] [address]    Special Requests   Final    NONE Performed at [facility], [address] [place], [place] [address]    Gram Stain   Final    MODERATE WBC PRESENT, PREDOMINANTLY PMN MODERATE GRAM POSITIVE COCCI MODERATE GRAM POSITIVE RODS    Culture   Final    Consistent with normal respiratory flora. Performed at [facility], [address] [place], [place] [address]    Report Status 02/02/2018 FINAL  Final  CULTURE, BLOOD (ROUTINE X 2) w Reflex to ID Panel     Status: None   Collection Time: 02/04/18  5:41 PM  Result Value Ref Range Status   Specimen Description BLOOD BLOOD LEFT HAND  Final   Special Requests   Final    BOTTLES DRAWN AEROBIC AND  ANAEROBIC Blood Culture adequate volume   Culture   Final    NO GROWTH 5 DAYS Performed at [facility], [address] [place], [place] [address]    Report Status 02/09/2018 FINAL  Final  CULTURE, BLOOD (ROUTINE X 2) w Reflex to ID Panel     Status: None   Collection Time: 02/04/18  8:50 PM  Result Value Ref Range Status   Specimen Description BLOOD LEFT HAND  Final   Special Requests   Final    BOTTLES DRAWN AEROBIC AND ANAEROBIC Blood Culture adequate volume   Culture   Final    NO GROWTH 5 DAYS Performed at [facility], [address] [place], [place] [address]    Report Status 02/10/2018 FINAL  Final  MRSA PCR Screening     Status: None   Collection Time: 02/05/18  9:38 AM  Result Value Ref Range Status   MRSA by PCR NEGATIVE NEGATIVE Final    Comment:        The GeneXpert MRSA Assay (FDA approved for NASAL specimens only), is one component of a comprehensive MRSA colonization surveillance program. It is not intended to diagnose MRSA infection nor to guide or monitor treatment for MRSA infections. Performed at [facility], [address] [place], [place] [address]   Culture, respiratory (NON-Expectorated)     Status: None   Collection Time: 02/10/18 12:02 PM  Result Value Ref Range Status   Specimen Description   Final    TRACHEAL ASPIRATE Performed at [facility], [address] [place], [place] [address]    Special Requests   Final    NONE Performed at [facility], [address] [place], [place] [address]    Gram Stain   Final    MODERATE WBC PRESENT,BOTH PMN AND MONONUCLEAR NO ORGANISMS SEEN    Culture   Final    Consistent with normal respiratory flora. Performed at [facility], [address], [place], [place] [address]    Report Status 02/12/2018 FINAL  Final  Culture, respiratory (NON-Expectorated)     Status: None   Collection Time: 02/13/18  1:42 PM  Result Value  Ref Range Status    Specimen Description   Final    TRACHEAL ASPIRATE Performed at [facility], [address] [place], [place] [address]    Special Requests   Final    NONE Performed at [facility], [address] [place], [place] [address]    Gram Stain   Final    ABUNDANT WBC PRESENT,BOTH PMN AND MONONUCLEAR RARE YEAST RARE GRAM VARIABLE ROD Performed at [facility], [address] [place], [place] [address]    Culture FEW KLEBSIELLA PNEUMONIAE  Final   Report Status 02/16/2018 FINAL  Final   Organism ID, Bacteria KLEBSIELLA PNEUMONIAE  Final      Susceptibility   Klebsiella pneumoniae - MIC*    AMPICILLIN >=32 RESISTANT Resistant     CEFAZOLIN <=4 SENSITIVE Sensitive     CEFEPIME <=1 SENSITIVE Sensitive     CEFTAZIDIME <=1 SENSITIVE Sensitive     CEFTRIAXONE <=1 SENSITIVE Sensitive     CIPROFLOXACIN <=0.25 SENSITIVE Sensitive     GENTAMICIN <=1 SENSITIVE Sensitive     IMIPENEM <=0.25 SENSITIVE Sensitive     TRIMETH/SULFA <=20 SENSITIVE Sensitive     AMPICILLIN/SULBACTAM 16 INTERMEDIATE Intermediate     PIP/TAZO 16 SENSITIVE Sensitive     Extended ESBL NEGATIVE Sensitive     * FEW KLEBSIELLA PNEUMONIAE  Culture, respiratory (NON-Expectorated)     Status: None   Collection Time: 02/22/18  1:25 PM  Result Value Ref Range Status   Specimen Description   Final    TRACHEAL ASPIRATE Performed at [facility], [address] [place], [place] [address]    Special Requests   Final    NONE Performed at [facility], [address] [place], [place] [address]    Gram Stain   Final    MODERATE WBC PRESENT, PREDOMINANTLY PMN RARE SQUAMOUS EPITHELIAL CELLS PRESENT MODERATE GRAM POSITIVE RODS MODERATE GRAM POSITIVE COCCI IN PAIRS    Culture   Final    Consistent with normal respiratory flora. Performed at [facility], [address] [place], [place] [address]    Report Status 02/24/2018 FINAL  Final  Urine Culture     Status: None    Collection Time: 02/22/18  1:34 PM  Result Value Ref Range Status   Specimen Description   Final    URINE, RANDOM Performed at [facility], [address] [place], [place] [address]    Special Requests   Final    NONE Performed at [facility], [address] [place], [place] [address]    Culture   Final    NO GROWTH Performed at [facility], [address] [place], [place] [address]    Report Status 02/23/2018 FINAL  Final  Culture, respiratory (NON-Expectorated)     Status: None   Collection Time: 02/24/18  5:10 AM  Result Value Ref Range Status   Specimen Description   Final    TRACHEAL ASPIRATE Performed at [facility], [address] [place], [place] [address]    Special Requests   Final    NONE Performed at [facility], [address] [place], [place] [address]    Gram Stain   Final    ABUNDANT WBC PRESENT, PREDOMINANTLY PMN FEW SQUAMOUS EPITHELIAL CELLS PRESENT ABUNDANT GRAM POSITIVE COCCI IN PAIRS IN CHAINS MODERATE GRAM NEGATIVE RODS RARE GRAM POSITIVE RODS    Culture   Final    Consistent with normal respiratory flora. Performed  at [facility], [address], [place], [place] [address]    Report Status 02/26/2018 FINAL  Final  CULTURE, BLOOD (ROUTINE X 2) w Reflex to ID Panel     Status: None   Collection Time: 02/24/18  5:24 AM  Result Value Ref Range Status   Specimen Description BLOOD L HAND  Final   Special Requests   Final    BOTTLES DRAWN AEROBIC AND ANAEROBIC Blood Culture adequate volume   Culture   Final    NO GROWTH 5 DAYS Performed at [facility], [address] [place], [place] [address]    Report Status 03/01/2018 FINAL  Final  CULTURE, BLOOD (ROUTINE X 2) w Reflex to ID Panel     Status: Abnormal   Collection Time: 02/24/18  5:27 AM  Result Value Ref Range Status   Specimen Description   Final    BLOOD RIGHT HAND Performed at [facility], [address], [place], [place] [address]    Special Requests   Final    BOTTLES DRAWN AEROBIC AND ANAEROBIC Blood Culture adequate volume Performed at [facility], [address] [place], [place] [address]    Culture  Setup Time   Final    ANAEROBIC BOTTLE ONLY CRITICAL RESULT CALLED TO, READ BACK BY AND VERIFIED WITH: C/ MATT MCBANE @0317  02/25/18 [facility] GRAM NEGATIVE RODS Performed at [facility], [address], [place], [place] [address]    Culture KLEBSIELLA PNEUMONIAE (A)  Final   Report Status 02/27/2018 FINAL  Final   Organism ID, Bacteria KLEBSIELLA PNEUMONIAE  Final      Susceptibility   Klebsiella pneumoniae - MIC*    AMPICILLIN >=32 RESISTANT Resistant     CEFAZOLIN <=4 SENSITIVE Sensitive     CEFEPIME <=1 SENSITIVE Sensitive     CEFTAZIDIME <=1 SENSITIVE Sensitive     CEFTRIAXONE <=1 SENSITIVE Sensitive     CIPROFLOXACIN <=0.25 SENSITIVE Sensitive     GENTAMICIN <=1 SENSITIVE Sensitive     IMIPENEM <=0.25 SENSITIVE Sensitive     TRIMETH/SULFA <=20 SENSITIVE Sensitive     AMPICILLIN/SULBACTAM 16 INTERMEDIATE Intermediate     PIP/TAZO 16 SENSITIVE Sensitive     Extended ESBL NEGATIVE Sensitive     * KLEBSIELLA PNEUMONIAE  Blood Culture ID Panel (Reflexed)     Status: Abnormal   Collection Time: 02/24/18  5:27 AM  Result Value Ref Range Status   Enterococcus species NOT DETECTED NOT DETECTED Final   Listeria monocytogenes NOT DETECTED NOT DETECTED Final   Staphylococcus species NOT DETECTED NOT DETECTED Final   Staphylococcus aureus NOT DETECTED NOT DETECTED Final   Streptococcus species NOT DETECTED NOT DETECTED Final   Streptococcus agalactiae NOT DETECTED NOT DETECTED Final   Streptococcus pneumoniae NOT DETECTED NOT DETECTED Final   Streptococcus pyogenes NOT DETECTED NOT DETECTED Final   Acinetobacter baumannii NOT DETECTED NOT DETECTED Final   Enterobacteriaceae species DETECTED (A) NOT DETECTED Final    Comment: Enterobacteriaceae represent a large family  of gram-negative bacteria, not a single organism.   Enterobacter cloacae complex NOT DETECTED NOT DETECTED Final   Escherichia coli NOT DETECTED NOT DETECTED Final   Klebsiella oxytoca NOT DETECTED NOT DETECTED Final   Klebsiella pneumoniae DETECTED (A) NOT DETECTED Final    Comment: CRITICAL RESULT CALLED TO, READ BACK BY AND VERIFIED WITH: MATT MCBANE AT 0317 ON 02/25/18 FLC.    Proteus species NOT DETECTED NOT DETECTED Final   Serratia marcescens NOT DETECTED NOT  DETECTED Final   Carbapenem resistance NOT DETECTED NOT DETECTED Final   Haemophilus influenzae NOT DETECTED NOT DETECTED Final   Neisseria meningitidis NOT DETECTED NOT DETECTED Final   Pseudomonas aeruginosa NOT DETECTED NOT DETECTED Final   Candida albicans NOT DETECTED NOT DETECTED Final   Candida glabrata NOT DETECTED NOT DETECTED Final   Candida krusei NOT DETECTED NOT DETECTED Final   Candida parapsilosis NOT DETECTED NOT DETECTED Final   Candida tropicalis NOT DETECTED NOT DETECTED Final    Comment: Performed at [facility], [address] [place], [place] [address]  MRSA PCR Screening     Status: None   Collection Time: 03/08/18 10:58 AM  Result Value Ref Range Status   MRSA by PCR NEGATIVE NEGATIVE Final    Comment:        The GeneXpert MRSA Assay (FDA approved for NASAL specimens only), is one component of a comprehensive MRSA colonization surveillance program. It is not intended to diagnose MRSA infection nor to guide or monitor treatment for MRSA infections. Performed at [facility], [address] [place], [place] [address]     RADIOLOGY:  No results found.   Management plans discussed with the patient, family and they are in agreement.  CODE STATUS: DNR   TOTAL TIME TAKING CARE OF THIS PATIENT: 36 minutes.    Shaune Pollack M.D on 03/19/2018 at 1:28 PM  Between 7am to 6pm - Pager - [phone]  After 6pm go to [email or web address] - Social research officer, government  [employer]  [place] [facility]  Office  [phone]  CC: Primary care physician; Marcina Millard, MD   Note: This dictation was prepared with Dragon dictation along with smaller phrase technology. Any transcriptional errors that result from this process are unintentional.

## 2018-03-19 NOTE — Clinical Social Work Note (Signed)
CSW was able to find SNF placement for patient at [facility] [place]. Velna HatchetSheila at [facility] states that they can accept patient today. CSW notified patient's mother Orland Declizabeth Lindsay [phone] that patient will [facility]. Mother is in agreement. Patient will need to be transferred by EMS. RN will call report and call for transport.   Ruthe Mannanandace Sharita Bienaime MSW, [address] [phone]

## 2018-04-11 ENCOUNTER — Other Ambulatory Visit (HOSPITAL_COMMUNITY): Payer: Self-pay | Admitting: Internal Medicine

## 2018-04-11 DIAGNOSIS — R1310 Dysphagia, unspecified: Secondary | ICD-10-CM

## 2018-04-15 ENCOUNTER — Ambulatory Visit (HOSPITAL_COMMUNITY)
Admission: RE | Admit: 2018-04-15 | Discharge: 2018-04-15 | Disposition: A | Discharge status: Home / Self Care | Payer: Medicaid Other | Source: Ambulatory Visit | Attending: Internal Medicine | Admitting: Internal Medicine

## 2018-04-15 DIAGNOSIS — R1312 Dysphagia, oropharyngeal phase: Secondary | ICD-10-CM | POA: Insufficient documentation

## 2018-04-15 DIAGNOSIS — R1310 Dysphagia, unspecified: Secondary | ICD-10-CM

## 2018-07-24 NOTE — Progress Notes (Signed)
[facility] Consultation      Assessment and Plan:  Chronic respiratory failure s/p tracheostomy.  - The patient had a long hospitalization, and underwent tracheostomy due to inability to wean from ventilator.  He was subsequently able to be weaned, and presents now for possible decannulation. - Patient does not have symptoms of dyspnea at this time, he is able to use a [name] speaking valve without difficulty throughout the day.  I would suspect that he could do well with decannulation, however I would like further testing to confirm this, particularly in light of his history of schizophrenia and traumatic brain injury, I am not sure that his history is particular reliable, and he has no caregivers with him that can provide further history.  COPD.  - COPD is suspected though I cannot do a spirometry or pulmonary function test to confirm this as the patient has a tracheostomy in place. - Can continue current bronchodilator therapy. - I will check an overnight oximetry on room air with the trach capped.  And can also sit or walk test if the patient is able.  Nicotine Abuse.  - Patient is currently smoking about 1 pack/day, discussed smoking cessation for 3 minutes, he is not currently interested in smoke cessation.  Excessive daytime sleepiness.  -Symptoms and signs of obstructive sleep apnea. - Due to his history of traumatic brain injury, schizophrenia, poor historian I think it would be best to have his trach cap trial done in the observed setting of a sleep lab.  Orders Placed This Encounter  Procedures  . Blood Gas, Arterial  . Split night study   Return in about 1 month (around 08/25/2018) for follow up after tests..  Greater than 50% of the 40 minute visit was spent in counseling/coordination of care regarding testing for sleep apnea, COPD, dyspnea to see if the patient can be safely decannulated.    Date: 07/25/2018  MRN# [id number] Tommy Mathews  March 02, 1961  Referring Physician: hospitalist for trach decannulation.   Tommy Mathews is a 57 y.o. old male seen in consultation for chief complaint of:    Chief Complaint  Patient presents with  . Consult    pt lives in a facility: he is here for eval of trach removal.    HPI:   The patient is a 57 yo male with history of TBI, schizophrenia. He had a recent long hospital admission from 01/06/18 to 03/19/18.  On review of his medical record, to summarize: He was intubated upon presentation on 4/8 due to respiratory distress with bilateral infiltrates and presumed pneumonia, he was also thought to have a GI bleed while on Xarelto. He was found to be in severe septic shock with ARF. He could not be weaned from the ventilator and underwent trach on 4/25 and PEG on 4/23, EGD at that time was normal.   Currently he is living in rehab, he has a trach in place, therfore he is non-vocal but can otherwise communicate on putting his finger over the trach.  He feels that his breathing is doing well. He does rehab at his facility, he has what sounds like a PM valve that allows him to speak. He left it at the facility and did not bring it with him.  He does not complain of dyspnea.  A transportation person is present, but she is not able to provide any history.  He smokes about a pack per day. He notes that he is sleepy during the day.  He  is not interested in quitting at this time.  **CXR 03/12/18>>Images personally reviewed, there is bilateral increased interstitial marking, reduced lung volumes. Trach in place.   Results for Tommy, Mathews (MRN [id number]) as of 07/24/2018 17:23  Ref. Range 03/12/2018 09:53  Sample type Unknown ARTERIAL DRAW  Delivery systems Unknown TRACH COLLAR/TRACH TUBE  FIO2 Unknown 0.35  pH, Arterial Latest Ref Range: 7.350 - 7.450  7.46 (H)  pCO2 arterial Latest Ref Range: 32.0 - 48.0 mmHg 48  pO2, Arterial Latest Ref Range: 83.0 - 108.0 mmHg 69 (L)  Acid-Base Excess Latest Ref  Range: 0.0 - 2.0 mmol/L 9.0 (H)  Bicarbonate Latest Ref Range: 20.0 - 28.0 mmol/L 34.1 (H)  O2 Saturation Latest Units: % 94.5  Patient temperature Unknown 37.0  Collection site Unknown LEFT RADIAL      PMHX:   Past Medical History:  Diagnosis Date  . Diabetes (HCC)   . GERD (gastroesophageal reflux disease)   . HLD (hyperlipidemia)   . HTN (hypertension)    Surgical Hx:  Past Surgical History:  Procedure Laterality Date  . PEG PLACEMENT N/A 01/21/2018   Procedure: PERCUTANEOUS ENDOSCOPIC GASTROSTOMY (PEG) PLACEMENT;  Surgeon: Wyline Mood, MD;  Location: [facility] ENDOSCOPY;  Service: Gastroenterology;  Laterality: N/A;  . TRACHEOSTOMY TUBE PLACEMENT N/A 01/23/2018   Procedure: TRACHEOSTOMY;  Surgeon: Bud Face, MD;  Location: [facility] ORS;  Service: ENT;  Laterality: N/A;   Family Hx:  History reviewed. No pertinent family history. Social Hx:   Social History   Tobacco Use  . Smoking status: Current Every Day Smoker    Packs/day: 1.00    Years: 40.00    Pack years: 40.00    Types: Cigarettes    Start date: 2000  . Smokeless tobacco: Never Used  Substance Use Topics  . Alcohol use: Never    Frequency: Never  . Drug use: Never   Medication:    Current Outpatient Medications:  .  acetylcysteine (MUCOMYST) 10% SOLN, Take 4 mLs by nebulization every 12 (twelve) hours as needed (secretions)., Disp: , Rfl:  .  bisacodyl (DULCOLAX) 10 MG suppository, Place 1 suppository (10 mg total) rectally daily as needed for moderate constipation., Disp: 12 suppository, Rfl: 0 .  budesonide (PULMICORT) 0.5 MG/2ML nebulizer solution, Take 2 mLs (0.5 mg total) by nebulization 2 (two) times daily., Disp: , Rfl: 12 .  clonazePAM (KLONOPIN) 0.5 MG tablet, Take 0.5 tablets (0.25 mg total) by mouth 2 (two) times daily., Disp: 30 tablet, Rfl: 0 .  digoxin (LANOXIN) 0.25 MG tablet, Place 1 tablet (0.25 mg total) into feeding tube daily., Disp: , Rfl:  .  docusate (COLACE) 50 MG/5ML liquid, Place  10 mLs (100 mg total) into feeding tube 2 (two) times daily as needed for mild constipation., Disp: 100 mL, Rfl: 0 .  docusate (COLACE) 50 MG/5ML liquid, Place 5 mLs (50 mg total) into feeding tube 2 (two) times daily., Disp: 100 mL, Rfl: 0 .  FLUoxetine (PROZAC) 20 MG/5ML solution, Place 5 mLs (20 mg total) into feeding tube daily., Disp: 120 mL, Rfl: 0 .  furosemide (LASIX) 20 MG tablet, Place 1 tablet (20 mg total) into feeding tube daily., Disp: 30 tablet, Rfl:  .  gabapentin (NEURONTIN) 250 MG/5ML solution, Place 4 mLs (200 mg total) into feeding tube every 12 (twelve) hours., Disp: , Rfl: 12 .  guaiFENesin (ROBITUSSIN) 100 MG/5ML SOLN, Place 10 mLs (200 mg total) into feeding tube 4 (four) times daily., Disp: 1200 mL, Rfl: 0 .  insulin glargine (LANTUS)  100 UNIT/ML injection, Inject 0.2 mLs (20 Units total) into the skin at bedtime., Disp: 10 mL, Rfl: 11 .  ipratropium-albuterol (DUONEB) 0.5-2.5 (3) MG/3ML SOLN, Take 3 mLs by nebulization every 4 (four) hours as needed., Disp: 360 mL, Rfl:  .  ipratropium-albuterol (DUONEB) 0.5-2.5 (3) MG/3ML SOLN, Take 3 mLs by nebulization every 4 (four) hours., Disp: 360 mL, Rfl:  .  ketoconazole (NIZORAL) 2 % cream, Apply topically daily as needed for irritation., Disp: 15 g, Rfl: 0 .  lisinopril (PRINIVIL,ZESTRIL) 10 MG tablet, Place 1 tablet (10 mg total) into feeding tube daily. Please hold for BP < 130 mmhg, Disp: , Rfl:  .  magnesium oxide (MAG-OX) 400 (241.3 Mg) MG tablet, Place 1 tablet (400 mg total) into feeding tube 2 (two) times daily., Disp: , Rfl:  .  metoprolol tartrate (LOPRESSOR) 25 MG tablet, Place 0.5 tablets (12.5 mg total) into feeding tube every 6 (six) hours. Please hold for BP < 130 mmhg, Disp: , Rfl:  .  mouth rinse LIQD solution, 15 mLs by Mouth Rinse route daily. 10 times per day., Disp: , Rfl: 0 .  Nutritional Supplements (FEEDING SUPPLEMENT, GLUCERNA 1.5 CAL,) LIQD, Place 1,000 mLs into feeding tube continuous., Disp: , Rfl:  .   nystatin (MYCOSTATIN) 100000 UNIT/ML suspension, Take 5 mLs (500,000 Units total) by mouth 4 (four) times daily., Disp: 60 mL, Rfl: 0 .  pantoprazole sodium (PROTONIX) 40 mg/20 mL PACK, Place 20 mLs (40 mg total) into feeding tube daily., Disp: 30 each, Rfl:  .  QUEtiapine (SEROQUEL) 200 MG tablet, Place 1 tablet (200 mg total) into feeding tube at bedtime., Disp: , Rfl:  .  QUEtiapine (SEROQUEL) 400 MG tablet, Place 1 tablet (400 mg total) into feeding tube daily., Disp: , Rfl:  .  scopolamine (TRANSDERM-SCOP) 1 MG/3DAYS, Place 1 patch (1.5 mg total) onto the skin every 3 (three) days., Disp: 10 patch, Rfl: 12 .  sodium chloride HYPERTONIC 3 % nebulizer solution, Take 4 mLs by nebulization daily., Disp: 750 mL, Rfl: 12 .  valproic acid (DEPAKENE) 250 MG/5ML SOLN solution, Place 10 mLs (500 mg total) into feeding tube 2 (two) times daily., Disp: 600 mL, Rfl:  .  Water For Irrigation, Sterile (FREE WATER) SOLN, Place 200 mLs into feeding tube every 8 (eight) hours., Disp: , Rfl:    Allergies:  Patient has no known allergies.  Review of Systems: Gen:  Denies  fever, sweats, chills HEENT: Denies blurred vision, double vision. bleeds, sore throat Cvc:  No dizziness, chest pain. Resp:   Denies cough or sputum production, shortness of breath Gi: Denies swallowing difficulty, stomach pain. Gu:  Denies bladder incontinence, burning urine Ext:   No Joint pain, stiffness. Skin: No skin rash,  hives  Endoc:  No polyuria, polydipsia. Psych: No depression, insomnia. Other:  All other systems were reviewed with the patient and were negative other that what is mentioned in the HPI.   Physical Examination:   VS: BP 116/76 (BP Location: Left Arm, Cuff Size: Large)   Pulse 64   Resp 16   Ht 5\' 10"  (1.778 m)   SpO2 93%   BMI 30.99 kg/m   General Appearance: No distress  Neuro:without focal findings,  speech normal,  HEENT: PERRLA, EOM intact.   Pulmonary: normal breath sounds, No wheezing.    CardiovascularNormal S1,S2.  No m/r/g.   Abdomen: Benign, Soft, non-tender. Renal:  No costovertebral tenderness  GU:  No performed at this time. Endoc: No evident thyromegaly, no  signs of acromegaly. Skin:   warm, no rashes, no ecchymosis  Extremities: normal, no cyanosis, clubbing.  Other findings:    LABORATORY PANEL:   CBC No results for input(s): WBC, HGB, HCT, PLT in the last 168 hours. ------------------------------------------------------------------------------------------------------------------  Chemistries  No results for input(s): NA, K, CL, CO2, GLUCOSE, BUN, CREATININE, CALCIUM, MG, AST, ALT, ALKPHOS, BILITOT in the last 168 hours.  Invalid input(s): GFRCGP ------------------------------------------------------------------------------------------------------------------  Cardiac Enzymes No results for input(s): TROPONINI in the last 168 hours. ------------------------------------------------------------  RADIOLOGY:  No results found.     Thank  you for the consultation and for allowing [facility] to assist in the care of your patient. Our recommendations are noted above.  Please contact us if we can be of further service.   Wells Guiles, M.D., F.C.C.P.  Board Certified in Internal Medicine, Pulmonary Medicine, Critical Care Medicine, and Sleep Medicine.  [facility] Office Number: [phone]   07/25/2018

## 2018-07-25 ENCOUNTER — Ambulatory Visit: Payer: Medicaid Other

## 2018-07-25 ENCOUNTER — Encounter: Payer: Self-pay | Admitting: Internal Medicine

## 2018-07-25 ENCOUNTER — Ambulatory Visit (INDEPENDENT_AMBULATORY_CARE_PROVIDER_SITE_OTHER): Payer: Medicaid Other | Admitting: Internal Medicine

## 2018-07-25 ENCOUNTER — Other Ambulatory Visit: Payer: Self-pay | Admitting: Internal Medicine

## 2018-07-25 ENCOUNTER — Other Ambulatory Visit
Admission: RE | Admit: 2018-07-25 | Discharge: 2018-07-25 | Disposition: A | Discharge status: Home / Self Care | Payer: Medicaid Other | Source: Ambulatory Visit | Attending: Internal Medicine | Admitting: Internal Medicine

## 2018-07-25 VITALS — BP 116/76 | HR 64 | Resp 16 | Ht 70.0 in

## 2018-07-25 DIAGNOSIS — F17210 Nicotine dependence, cigarettes, uncomplicated: Secondary | ICD-10-CM

## 2018-07-25 DIAGNOSIS — J449 Chronic obstructive pulmonary disease, unspecified: Secondary | ICD-10-CM | POA: Diagnosis not present

## 2018-07-25 DIAGNOSIS — J9611 Chronic respiratory failure with hypoxia: Secondary | ICD-10-CM

## 2018-07-25 DIAGNOSIS — J9610 Chronic respiratory failure, unspecified whether with hypoxia or hypercapnia: Secondary | ICD-10-CM | POA: Insufficient documentation

## 2018-07-25 LAB — BLOOD GAS, ARTERIAL
Acid-Base Excess: 1.7 mmol/L (ref 0.0–2.0)
Bicarbonate: 26 mmol/L (ref 20.0–28.0)
FIO2: 0.21
O2 SAT: 89.5 %
PATIENT TEMPERATURE: 37
PO2 ART: 57 mmHg — AB (ref 83.0–108.0)
pCO2 arterial: 42 mmHg (ref 32.0–48.0)
pH, Arterial: 7.41 (ref 7.350–7.450)

## 2018-07-25 NOTE — Patient Instructions (Addendum)
Will send you for a sleep study. Your tracheostomy must be capped during this study.  Will send you for a blood gas test with your trach capped.  Will send you for a 6 minute walk test with your trach capped.   --Quitting smoking is the most important thing that you can do for your health.  --Quitting smoking will have greater affect on your health than any medicine that we can give you.

## 2018-08-05 ENCOUNTER — Other Ambulatory Visit: Payer: Self-pay | Admitting: Internal Medicine

## 2018-08-05 DIAGNOSIS — R633 Feeding difficulties, unspecified: Secondary | ICD-10-CM

## 2018-08-15 ENCOUNTER — Encounter (HOSPITAL_COMMUNITY): Payer: Self-pay | Admitting: Diagnostic Radiology

## 2018-08-15 ENCOUNTER — Ambulatory Visit (HOSPITAL_COMMUNITY)
Admission: RE | Admit: 2018-08-15 | Discharge: 2018-08-15 | Disposition: A | Discharge status: Home / Self Care | Payer: Medicaid Other | Source: Ambulatory Visit | Attending: Internal Medicine | Admitting: Internal Medicine

## 2018-08-15 DIAGNOSIS — R633 Feeding difficulties, unspecified: Secondary | ICD-10-CM

## 2018-08-15 DIAGNOSIS — Z431 Encounter for attention to gastrostomy: Secondary | ICD-10-CM | POA: Diagnosis present

## 2018-08-15 HISTORY — PX: IR GASTROSTOMY TUBE REMOVAL: IMG5492

## 2018-08-15 MED ORDER — LIDOCAINE VISCOUS HCL 2 % MT SOLN
OROMUCOSAL | Status: AC
  Filled 2018-08-15: qty 15

## 2018-08-15 MED ORDER — LIDOCAINE VISCOUS HCL 2 % MT SOLN
OROMUCOSAL | Status: DC | PRN
  Administered 2018-08-15: 15 mL via OROMUCOSAL

## 2018-08-15 NOTE — Procedures (Signed)
Gastrostomy tube was easily removed with traction.  Tube was completely removed without complication.  No bleeding.

## 2018-08-16 ENCOUNTER — Emergency Department (HOSPITAL_COMMUNITY): Payer: Medicaid Other

## 2018-08-16 ENCOUNTER — Emergency Department (HOSPITAL_COMMUNITY)
Admission: EM | Admit: 2018-08-16 | Discharge: 2018-08-17 | Disposition: A | Discharge status: Skilled Nursing Facility | Payer: Medicaid Other | Attending: Emergency Medicine | Admitting: Emergency Medicine

## 2018-08-16 ENCOUNTER — Other Ambulatory Visit: Payer: Self-pay

## 2018-08-16 ENCOUNTER — Encounter (HOSPITAL_COMMUNITY): Payer: Self-pay | Admitting: *Deleted

## 2018-08-16 DIAGNOSIS — F17210 Nicotine dependence, cigarettes, uncomplicated: Secondary | ICD-10-CM | POA: Diagnosis not present

## 2018-08-16 DIAGNOSIS — Z930 Tracheostomy status: Secondary | ICD-10-CM | POA: Insufficient documentation

## 2018-08-16 DIAGNOSIS — E119 Type 2 diabetes mellitus without complications: Secondary | ICD-10-CM | POA: Insufficient documentation

## 2018-08-16 DIAGNOSIS — R042 Hemoptysis: Secondary | ICD-10-CM | POA: Diagnosis not present

## 2018-08-16 DIAGNOSIS — I10 Essential (primary) hypertension: Secondary | ICD-10-CM | POA: Insufficient documentation

## 2018-08-16 DIAGNOSIS — Z79899 Other long term (current) drug therapy: Secondary | ICD-10-CM | POA: Diagnosis not present

## 2018-08-16 LAB — CBC
HEMATOCRIT: 42.7 % (ref 39.0–52.0)
Hemoglobin: 12.7 g/dL — ABNORMAL LOW (ref 13.0–17.0)
MCH: 25.2 pg — ABNORMAL LOW (ref 26.0–34.0)
MCHC: 29.7 g/dL — ABNORMAL LOW (ref 30.0–36.0)
MCV: 84.9 fL (ref 80.0–100.0)
NRBC: 0 % (ref 0.0–0.2)
PLATELETS: 202 10*3/uL (ref 150–400)
RBC: 5.03 MIL/uL (ref 4.22–5.81)
RDW: 17.3 % — ABNORMAL HIGH (ref 11.5–15.5)
WBC: 9.6 10*3/uL (ref 4.0–10.5)

## 2018-08-16 LAB — BASIC METABOLIC PANEL
ANION GAP: 7 (ref 5–15)
BUN: 14 mg/dL (ref 6–20)
CO2: 29 mmol/L (ref 22–32)
Calcium: 9 mg/dL (ref 8.9–10.3)
Chloride: 105 mmol/L (ref 98–111)
Creatinine, Ser: 0.98 mg/dL (ref 0.61–1.24)
GFR calc Af Amer: >60 mL/min (ref >60)
GFR calc non Af Amer: >60 mL/min (ref >60)
Glucose, Bld: 113 mg/dL — ABNORMAL HIGH (ref 70–99)
POTASSIUM: 4 mmol/L (ref 3.5–5.1)
Sodium: 141 mmol/L (ref 135–145)

## 2018-08-16 NOTE — ED Triage Notes (Signed)
Per EMS pt coming [facility] with a c/o hemoptysis that started earlier this evening. Per EMS staff at NH reported pt had G-tube removed yesterday and Dr wanted him evaluated after he started coughing up blood.

## 2018-08-16 NOTE — Discharge Instructions (Signed)
Work-up for the hemoptysis here without any acute findings.  Chest x-ray negative labs with normal hemoglobin hematocrit.  No concerns clinically for pulmonary embolus oxygen saturations are normal on room air and patient not tachycardic.  Respiratory therapy saw the patient I do recommend having the the tracheal cannula removed and cleaned twice daily.  And also recommend that he is on humidified air.  The area was very dry and side.

## 2018-08-16 NOTE — ED Notes (Signed)
Patient transported to X-ray 

## 2018-08-16 NOTE — Progress Notes (Signed)
Called to ED to provide trach care.  Inner cannula removed and was completely occluded with dried secretions.  Pt. Claims that inner cannula was changed less than 24 hours ago.  Dried crusty secretions noted around, under and behind trach plate.  Area completely cleaned and new fresh dressing applied.  Suctioned patient for large mucus plug X2.  PMV replaced and educated patient on importance of humidification for airway.  Patient stated that he understood.

## 2018-08-16 NOTE — ED Provider Notes (Signed)
[facility]-EMERGENCY DEPT Provider Note   CSN: [id number] Arrival date & time: 08/16/18  2043     History   Chief Complaint Chief Complaint  Patient presents with  . coughing up blood    HPI Tommy Mathews is a 57 y.o. male.  Patient sent in from nursing facility with complaint of hemoptysis that started earlier this evening.  Nursing home reported the patient had a G-tube removed yesterday.  Dr. wanted no abdominal pain no chest pain no shortness of breath.  Him evaluated after he started coughing up blood.  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  Patient had a tracheostomy tube placed in April 2019.  Patient without any specific complaints other than states that he has been coughing says he has cough since then and has not been blood.  He does not vomit.  Patient is from the [facility] facility.  Nursing triage notes with some confusion about mention of hematemesis.  The patient states it was just coughing up blood.  Patient hemodynamically stable in route.  Upon arrival here no fever respiratory rate 20 blood pressure 133/87 heart rate 87.  Patient is not normally on oxygen.  Oxygen saturation was 95% on room air.     Past Medical History:  Diagnosis Date  . Diabetes (HCC)   . GERD (gastroesophageal reflux disease)   . HLD (hyperlipidemia)   . HTN (hypertension)     Patient Active Problem List   Diagnosis Date Noted  . Acute respiratory failure (HCC)   . 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/2018  . Bipolar I disorder, most recent episode depressed (HCC) 03/03/2018  . Schizophrenia (HCC) 02/27/2018  . Pressure injury of skin 01/18/2018  . GI bleed 01/06/2018  . Acute respiratory failure with hypoxia (HCC) 01/06/2018  . Severe sepsis with septic shock (HCC) 01/06/2018  . Multifocal pneumonia 01/06/2018  . UTI (urinary tract infection) 01/06/2018  . AKI (acute kidney injury) (HCC) 01/06/2018  . Ileus  (HCC) 01/06/2018  . Diabetes (HCC) 01/06/2018    Past Surgical History:  Procedure Laterality Date  . IR GASTROSTOMY TUBE REMOVAL  08/15/2018  . PEG PLACEMENT N/A 01/21/2018   Procedure: PERCUTANEOUS ENDOSCOPIC GASTROSTOMY (PEG) PLACEMENT;  Surgeon: Wyline Mood, MD;  Location: [facility] ENDOSCOPY;  Service: Gastroenterology;  Laterality: N/A;  . TRACHEOSTOMY TUBE PLACEMENT N/A 01/23/2018   Procedure: TRACHEOSTOMY;  Surgeon: Bud Face, MD;  Location: [facility] ORS;  Service: ENT;  Laterality: N/A;        Home Medications    Prior to Admission medications   Medication Sig Start Date End Date Taking? Authorizing Provider  acetylcysteine (MUCOMYST) 10% SOLN Take 4 mLs by nebulization every 12 (twelve) hours as needed (secretions). 03/19/18   Shaune Pollack, MD  bisacodyl (DULCOLAX) 10 MG suppository Place 1 suppository (10 mg total) rectally daily as needed for moderate constipation. 03/19/18   Shaune Pollack, MD  budesonide (PULMICORT) 0.5 MG/2ML nebulizer solution Take 2 mLs (0.5 mg total) by nebulization 2 (two) times daily. 03/19/18   Shaune Pollack, MD  clonazePAM (KLONOPIN) 0.5 MG tablet Take 0.5 tablets (0.25 mg total) by mouth 2 (two) times daily. 03/19/18   Shaune Pollack, MD  digoxin (LANOXIN) 0.25 MG tablet Place 1 tablet (0.25 mg total) into feeding tube daily. 03/20/18   Shaune Pollack, MD  docusate (COLACE) 50 MG/5ML liquid Place 10 mLs (100 mg total) into feeding tube 2 (two) times daily as needed for mild constipation. 03/19/18   Shaune Pollack,  MD  docusate (COLACE) 50 MG/5ML liquid Place 5 mLs (50 mg total) into feeding tube 2 (two) times daily. 03/19/18   Shaune Pollackhen, Qing, MD  FLUoxetine (PROZAC) 20 MG/5ML solution Place 5 mLs (20 mg total) into feeding tube daily. 03/20/18   Shaune Pollackhen, Qing, MD  furosemide (LASIX) 20 MG tablet Place 1 tablet (20 mg total) into feeding tube daily. 03/20/18   Shaune Pollackhen, Qing, MD  gabapentin (NEURONTIN) 250 MG/5ML solution Place 4 mLs (200 mg total) into feeding tube every 12 (twelve) hours.  03/19/18   Shaune Pollackhen, Qing, MD  guaiFENesin (ROBITUSSIN) 100 MG/5ML SOLN Place 10 mLs (200 mg total) into feeding tube 4 (four) times daily. 03/19/18   Shaune Pollackhen, Qing, MD  insulin glargine (LANTUS) 100 UNIT/ML injection Inject 0.2 mLs (20 Units total) into the skin at bedtime. 03/19/18   Shaune Pollackhen, Qing, MD  ipratropium-albuterol (DUONEB) 0.5-2.5 (3) MG/3ML SOLN Take 3 mLs by nebulization every 4 (four) hours as needed. 03/19/18   Shaune Pollackhen, Qing, MD  ipratropium-albuterol (DUONEB) 0.5-2.5 (3) MG/3ML SOLN Take 3 mLs by nebulization every 4 (four) hours. 03/19/18   Shaune Pollackhen, Qing, MD  ketoconazole (NIZORAL) 2 % cream Apply topically daily as needed for irritation. 03/19/18   Shaune Pollackhen, Qing, MD  lisinopril (PRINIVIL,ZESTRIL) 10 MG tablet Place 1 tablet (10 mg total) into feeding tube daily. Please hold for BP < 130 mmhg 03/20/18   Shaune Pollackhen, Qing, MD  magnesium oxide (MAG-OX) 400 (241.3 Mg) MG tablet Place 1 tablet (400 mg total) into feeding tube 2 (two) times daily. 03/19/18   Shaune Pollackhen, Qing, MD  metoprolol tartrate (LOPRESSOR) 25 MG tablet Place 0.5 tablets (12.5 mg total) into feeding tube every 6 (six) hours. Please hold for BP < 130 mmhg 03/19/18   Shaune Pollackhen, Qing, MD  mouth rinse LIQD solution 15 mLs by Mouth Rinse route daily. 10 times per day. 03/19/18   Shaune Pollackhen, Qing, MD  Nutritional Supplements (FEEDING SUPPLEMENT, GLUCERNA 1.5 CAL,) LIQD Place 1,000 mLs into feeding tube continuous. 03/19/18   Shaune Pollackhen, Qing, MD  nystatin (MYCOSTATIN) 100000 UNIT/ML suspension Take 5 mLs (500,000 Units total) by mouth 4 (four) times daily. 03/19/18   Shaune Pollackhen, Qing, MD  pantoprazole sodium (PROTONIX) 40 mg/20 mL PACK Place 20 mLs (40 mg total) into feeding tube daily. 03/20/18   Shaune Pollackhen, Qing, MD  QUEtiapine (SEROQUEL) 200 MG tablet Place 1 tablet (200 mg total) into feeding tube at bedtime. 03/19/18   Shaune Pollackhen, Qing, MD  QUEtiapine (SEROQUEL) 400 MG tablet Place 1 tablet (400 mg total) into feeding tube daily. 03/20/18   Shaune Pollackhen, Qing, MD  scopolamine (TRANSDERM-SCOP) 1 MG/3DAYS  Place 1 patch (1.5 mg total) onto the skin every 3 (three) days. 03/22/18   Shaune Pollackhen, Qing, MD  sodium chloride HYPERTONIC 3 % nebulizer solution Take 4 mLs by nebulization daily. 03/20/18   Shaune Pollackhen, Qing, MD  valproic acid (DEPAKENE) 250 MG/5ML SOLN solution Place 10 mLs (500 mg total) into feeding tube 2 (two) times daily. 03/19/18   Shaune Pollackhen, Qing, MD  Water For Irrigation, Sterile (FREE WATER) SOLN Place 200 mLs into feeding tube every 8 (eight) hours. 03/19/18   Shaune Pollackhen, Qing, MD    Family History No family history on file.  Social History Social History   Tobacco Use  . Smoking status: Current Every Day Smoker    Packs/day: 1.00    Years: 40.00    Pack years: 40.00    Types: Cigarettes    Start date: 2000  . Smokeless tobacco: Never Used  Substance Use Topics  . Alcohol  use: Never    Frequency: Never  . Drug use: Never     Allergies   Patient has no known allergies.   Review of Systems Review of Systems  Constitutional: Negative for fever.  HENT: Negative for congestion.   Eyes: Negative for redness.  Respiratory: Positive for cough. Negative for shortness of breath.   Cardiovascular: Negative for chest pain and leg swelling.  Gastrointestinal: Negative for abdominal pain and vomiting.  Genitourinary: Negative for hematuria.  Neurological: Negative for syncope.  Hematological: Does not bruise/bleed easily.     Physical Exam Updated Vital Signs BP 133/87 (BP Location: Left Arm)   Pulse 88   Temp 98 F (36.7 C) (Oral)   Resp (!) 22   SpO2 96%   Physical Exam  Constitutional: He is oriented to person, place, and time. He appears well-developed and well-nourished. No distress.  HENT:  Head: Normocephalic and atraumatic.  Mouth/Throat: Oropharynx is clear and moist. No oropharyngeal exudate.  Oropharynx clear no signs of any dried blood.  Eyes: Pupils are equal, round, and reactive to light. Conjunctivae and EOM are normal.  Neck: Neck supple.  Trach and trach tube in  place.  No surrounding erythema.  Patient talking fine.  No evidence of any blood around the trach tube.  Cardiovascular: Normal rate, regular rhythm and normal heart sounds.  Pulmonary/Chest: Effort normal and breath sounds normal. No respiratory distress.  Abdominal: Soft. Bowel sounds are normal.  G-tube site in the epigastric area without evidence of any bleeding.  Musculoskeletal: Normal range of motion. He exhibits no edema.  Neurological: He is alert and oriented to person, place, and time. No cranial nerve deficit or sensory deficit. He exhibits normal muscle tone. Coordination abnormal.  Skin: Skin is warm.  Nursing note and vitals reviewed.    ED Treatments / Results  Labs (all labs ordered are listed, but only abnormal results are displayed) Labs Reviewed  CBC - Abnormal; Notable for the following components:      Result Value   Hemoglobin 12.7 (*)    MCH 25.2 (*)    MCHC 29.7 (*)    RDW 17.3 (*)    All other components within normal limits  BASIC METABOLIC PANEL - Abnormal; Notable for the following components:   Glucose, Bld 113 (*)    All other components within normal limits    EKG None  Radiology Dg Chest 2 View  Result Date: 08/16/2018 CLINICAL DATA:  Hemoptysis EXAM: CHEST - 2 VIEW COMPARISON:  03/12/2018 FINDINGS: Tracheostomy is unchanged. Heart is normal size. No confluent airspace opacities or effusions. No acute bony abnormality. IMPRESSION: No active cardiopulmonary disease. Electronically Signed   By: Charlett Nose M.D.   On: 08/16/2018 22:35   Ir Gastrostomy Tube Removal  Result Date: 08/15/2018 INDICATION: 57 year old with history of dysphagia. A PEG was placed by Dr. Wyline Mood on 01/21/2018. The patient no longer needs the gastrostomy tube and request for tube removal. EXAM: REMOVAL OF GASTROSTOMY TUBE WITHOUT FLUOROSCOPY MEDICATIONS: Viscous lidocaine ANESTHESIA/SEDATION: None CONTRAST:  None FLUOROSCOPY TIME:  None COMPLICATIONS: None immediate.  PROCEDURE: Informed written consent was obtained from the patient after a thorough discussion of the procedural risks, benefits and alternatives. All questions were addressed. The tube skin site was lubricated with viscous lidocaine. The tube was easily moved with manual traction. Inspection of the tube confirmed that the tube was entirely intact. Bandage placed over the gastrostomy tube exit site. IMPRESSION: Successful removal of the gastrostomy tube. Electronically Signed  By: Richarda Overlie M.D.   On: 08/15/2018 09:41    Procedures Procedures (including critical care time)  Medications Ordered in ED Medications - No data to display   Initial Impression / Assessment and Plan / ED Course  I have reviewed the triage vital signs and the nursing notes.  Pertinent labs & imaging results that were available during my care of the patient were reviewed by me and considered in my medical decision making (see chart for details).    Patient here without any evidence of bleeding at the trach site.  Respiratory therapy did evaluated.  They are recommending that the trach to be cleaned twice a day and that he have humidified air to prevent drying out of the area.  Clinically some concern will be had mix notes it appears that definite nursing home they were mostly worried about hemoptysis not hematemesis.  But even if there was some concern for hematemesis.  Hemoglobin stable here abdomen soft nontender no evidence of any significant bleed.  No evidence for any significant bleed in the lungs.  No concerns for pulmonary embolus clinically patient not tachycardic not hypoxic.  Patient stable for discharge back home to nursing facility.  Patient to return for any worse bleeding.   Final Clinical Impressions(s) / ED Diagnoses   Final diagnoses:  Hemoptysis    ED Discharge Orders    None       Vanetta Mulders, MD 08/17/18 0001

## 2018-08-16 NOTE — ED Notes (Signed)
Bed: WA09 Expected date:  Expected time:  Means of arrival:  Comments: EMS : Hememesis

## 2018-08-17 NOTE — ED Notes (Signed)
[facility] called for transportation  

## 2018-08-17 NOTE — ED Notes (Signed)
[facility]

## 2018-09-05 ENCOUNTER — Ambulatory Visit: Payer: Medicaid Other | Admitting: Internal Medicine

## 2018-10-21 ENCOUNTER — Ambulatory Visit (HOSPITAL_BASED_OUTPATIENT_CLINIC_OR_DEPARTMENT_OTHER): Payer: Medicaid Other | Attending: Internal Medicine

## 2018-12-20 IMAGING — DX DG ABDOMEN 1V
2 series · 2 of 2 positions shown · non-contrast
Comparison: 01/13/2018

CLINICAL DATA: Constipation

EXAM:
ABDOMEN - 1 VIEW

[abdomen kub (1 of 2)]
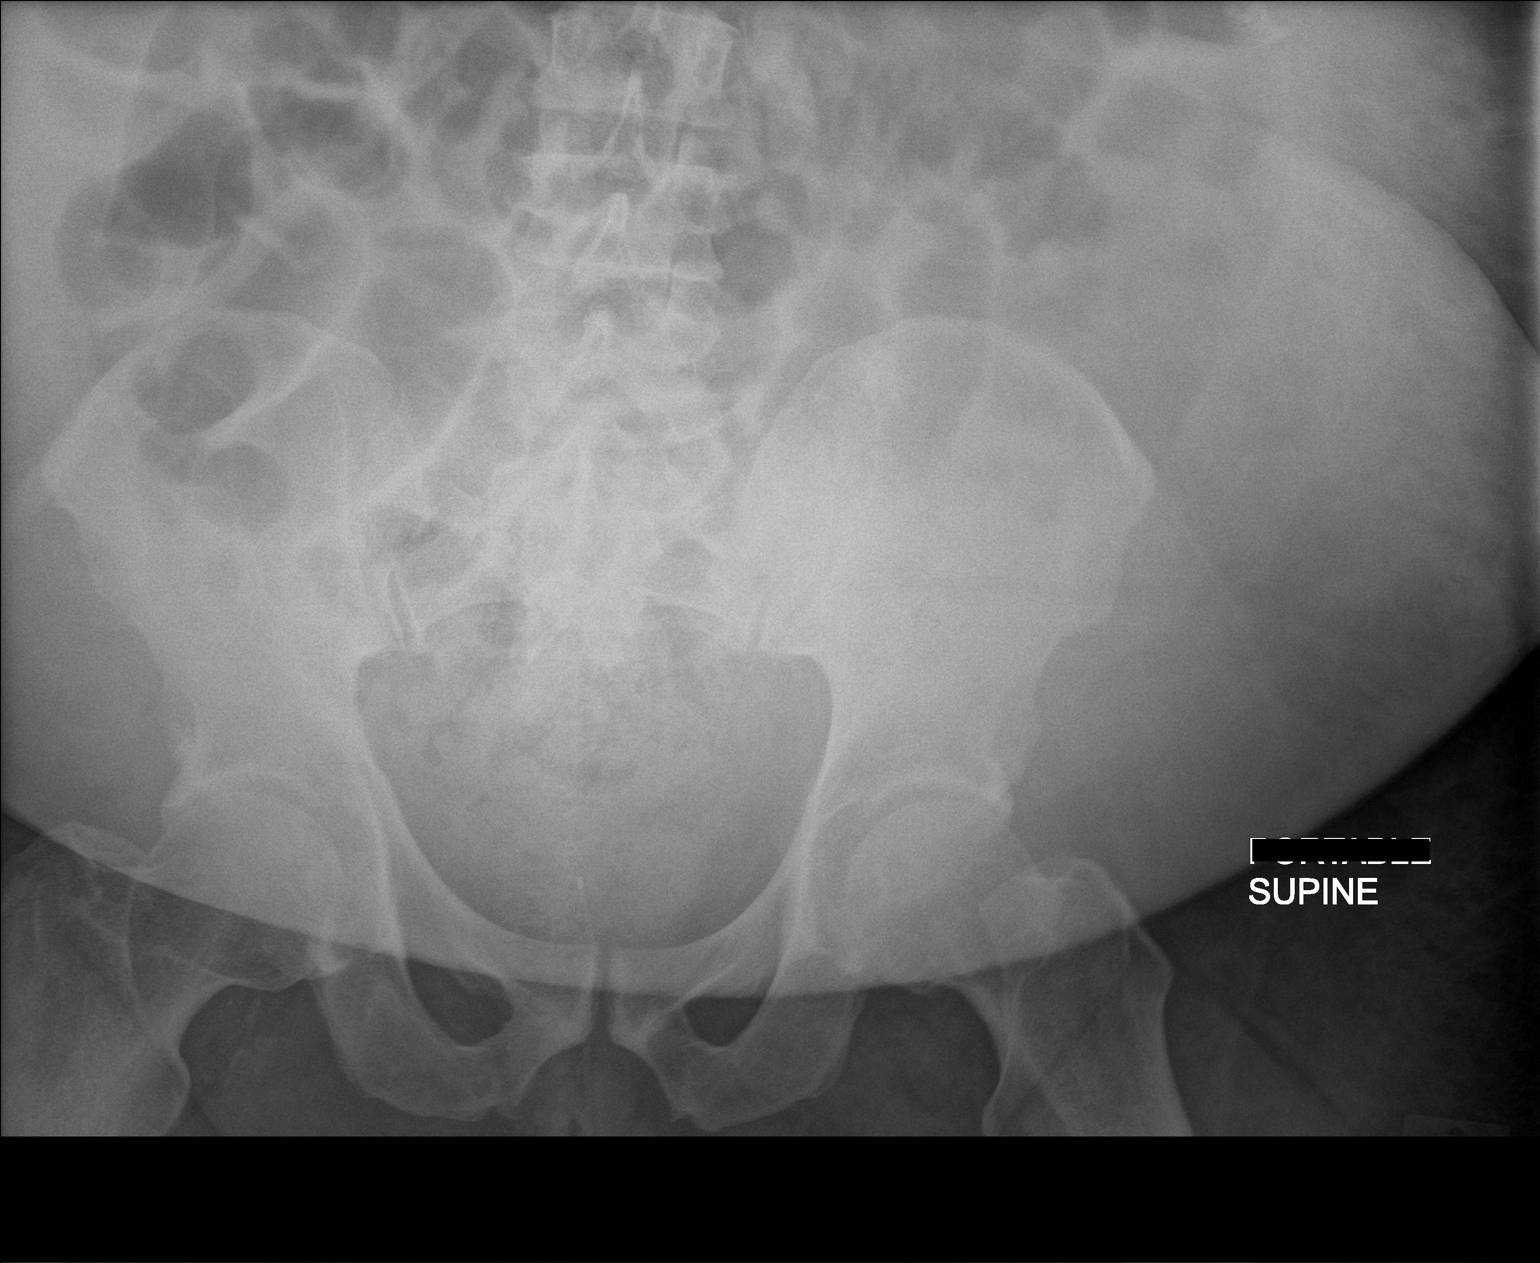

[abdomen kub (2 of 2)]
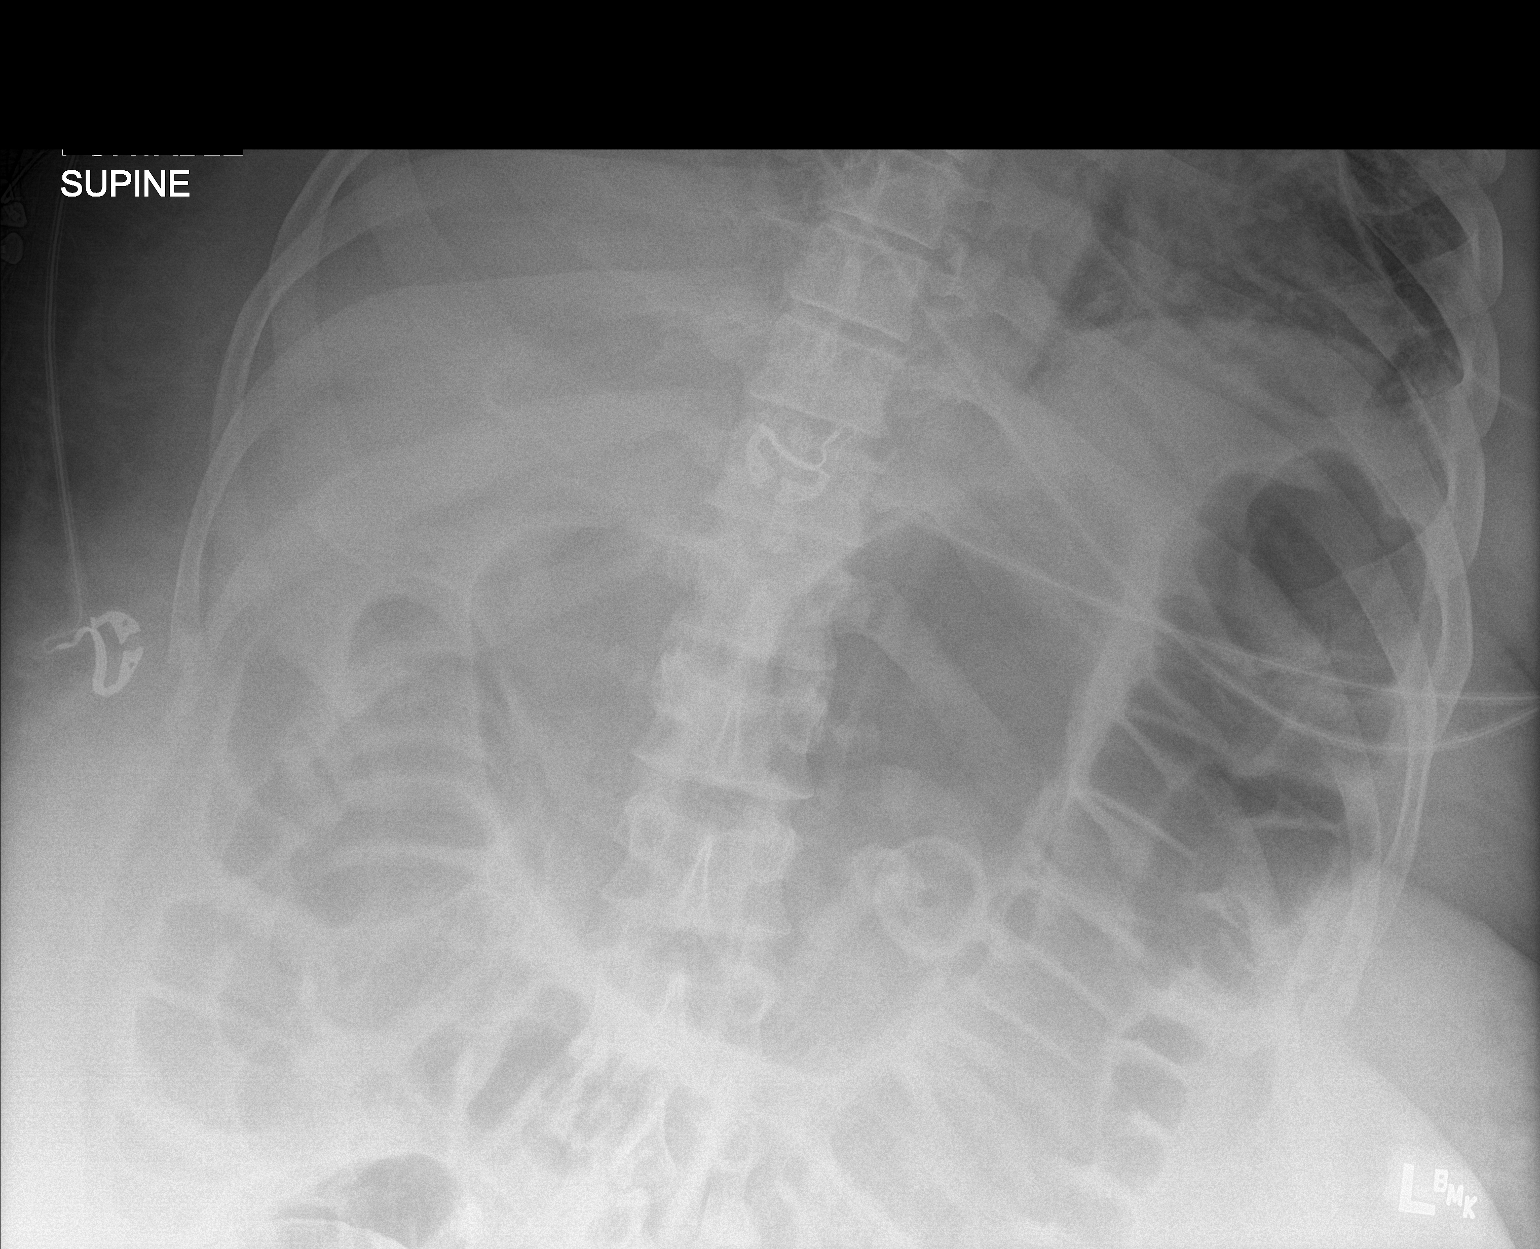

[2 of 2 positions shown; findings below may reference images not displayed]

FINDINGS: A gastrostomy tube is demonstrated over the stomach. There is mild
gaseous distention of the stomach, colon, and small bowel. Prominent
stool in the rectum. Changes may be due to adynamic ileus or
pseudo-obstruction from impacted rectal stool. No radiopaque stones.
Infiltration or atelectasis in the lung bases.
IMPRESSION: Mildly gas distended stomach, small bowel, and colon with prominent
stool in the rectum suggesting adynamic ileus or pseudo-obstruction
due to stool impaction.

## 2018-12-24 IMAGING — DX DG CHEST 1V PORT
1 series · 1 of 1 positions shown · non-contrast
Comparison: 01/23/2018

CLINICAL DATA: Pneumonia

EXAM:
PORTABLE CHEST 1 VIEW

[chest ap]
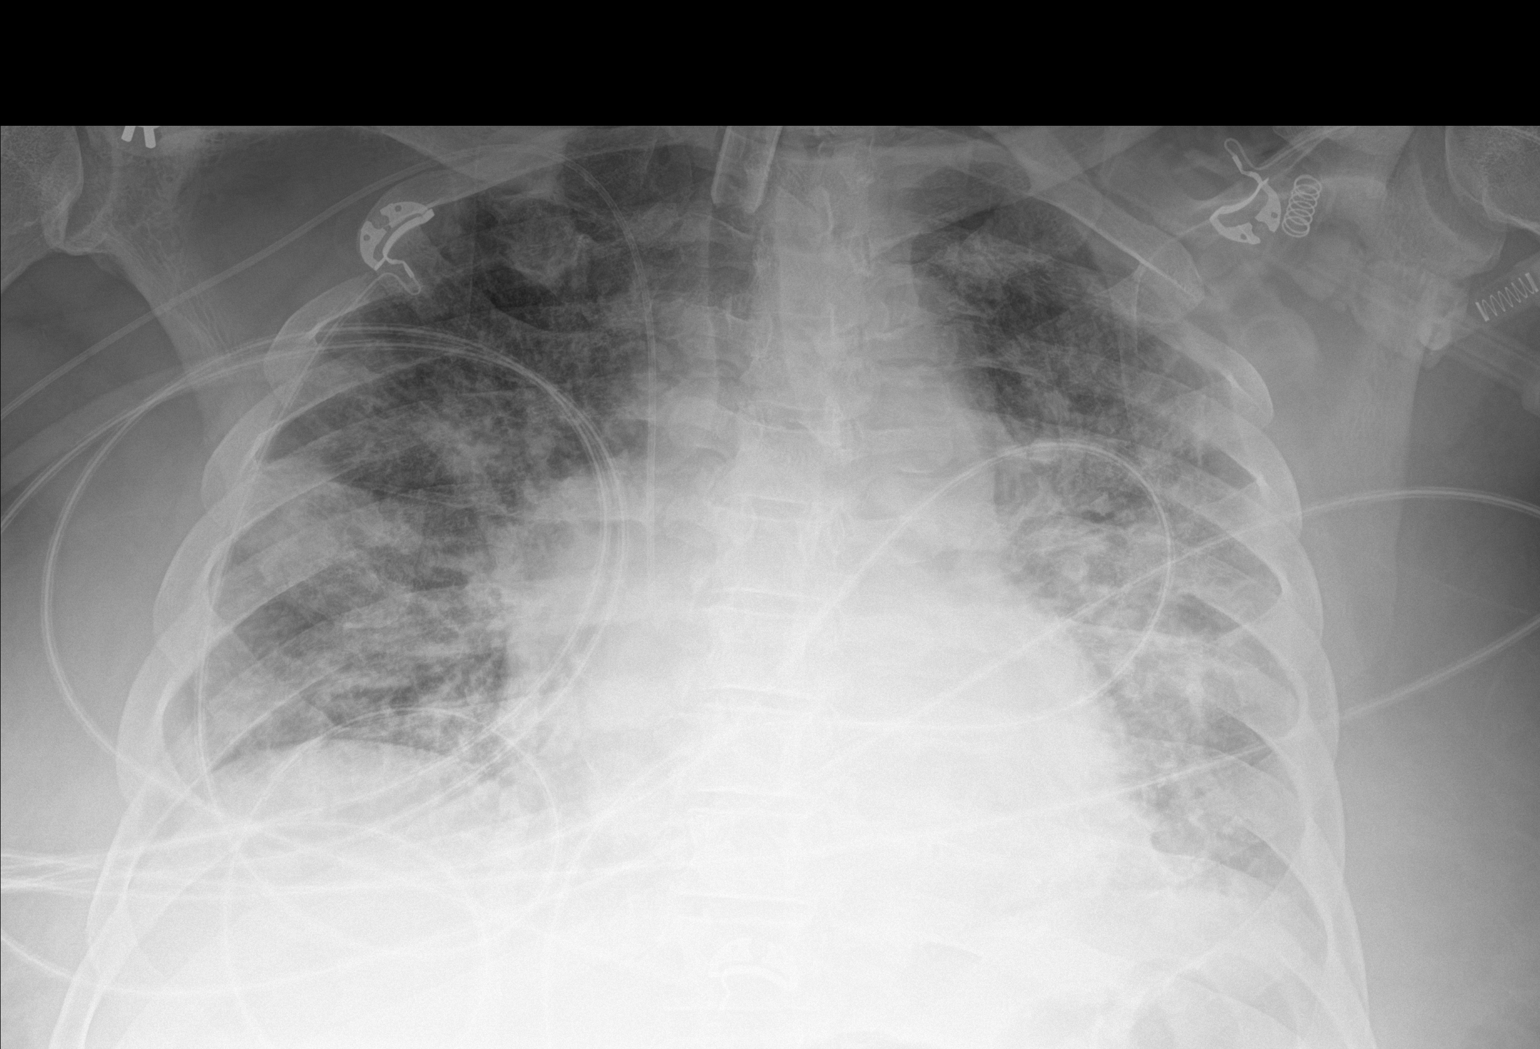

[1 of 1 positions shown; findings below may reference images not displayed]

FINDINGS: Tracheostomy and right PICC line remain in place, unchanged.
Cardiomegaly. Vascular congestion and diffuse bilateral airspace
opacities are again noted, unchanged. Low lung volumes. No visible
effusions or acute bony abnormality.
IMPRESSION: Cardiomegaly with vascular congestion and diffuse bilateral airspace
disease which could reflect edema or infection. No change.

## 2018-12-28 IMAGING — DX DG CHEST 1V PORT
1 series · 2 of 2 positions shown · non-contrast
Comparison: Chest radiograph from one day prior.

CLINICAL DATA: Pneumonia

EXAM:
PORTABLE CHEST 1 VIEW

[Series 1: chest ap · 0.14mm/px · 2 of 2 slices shown]
[im 1/2]
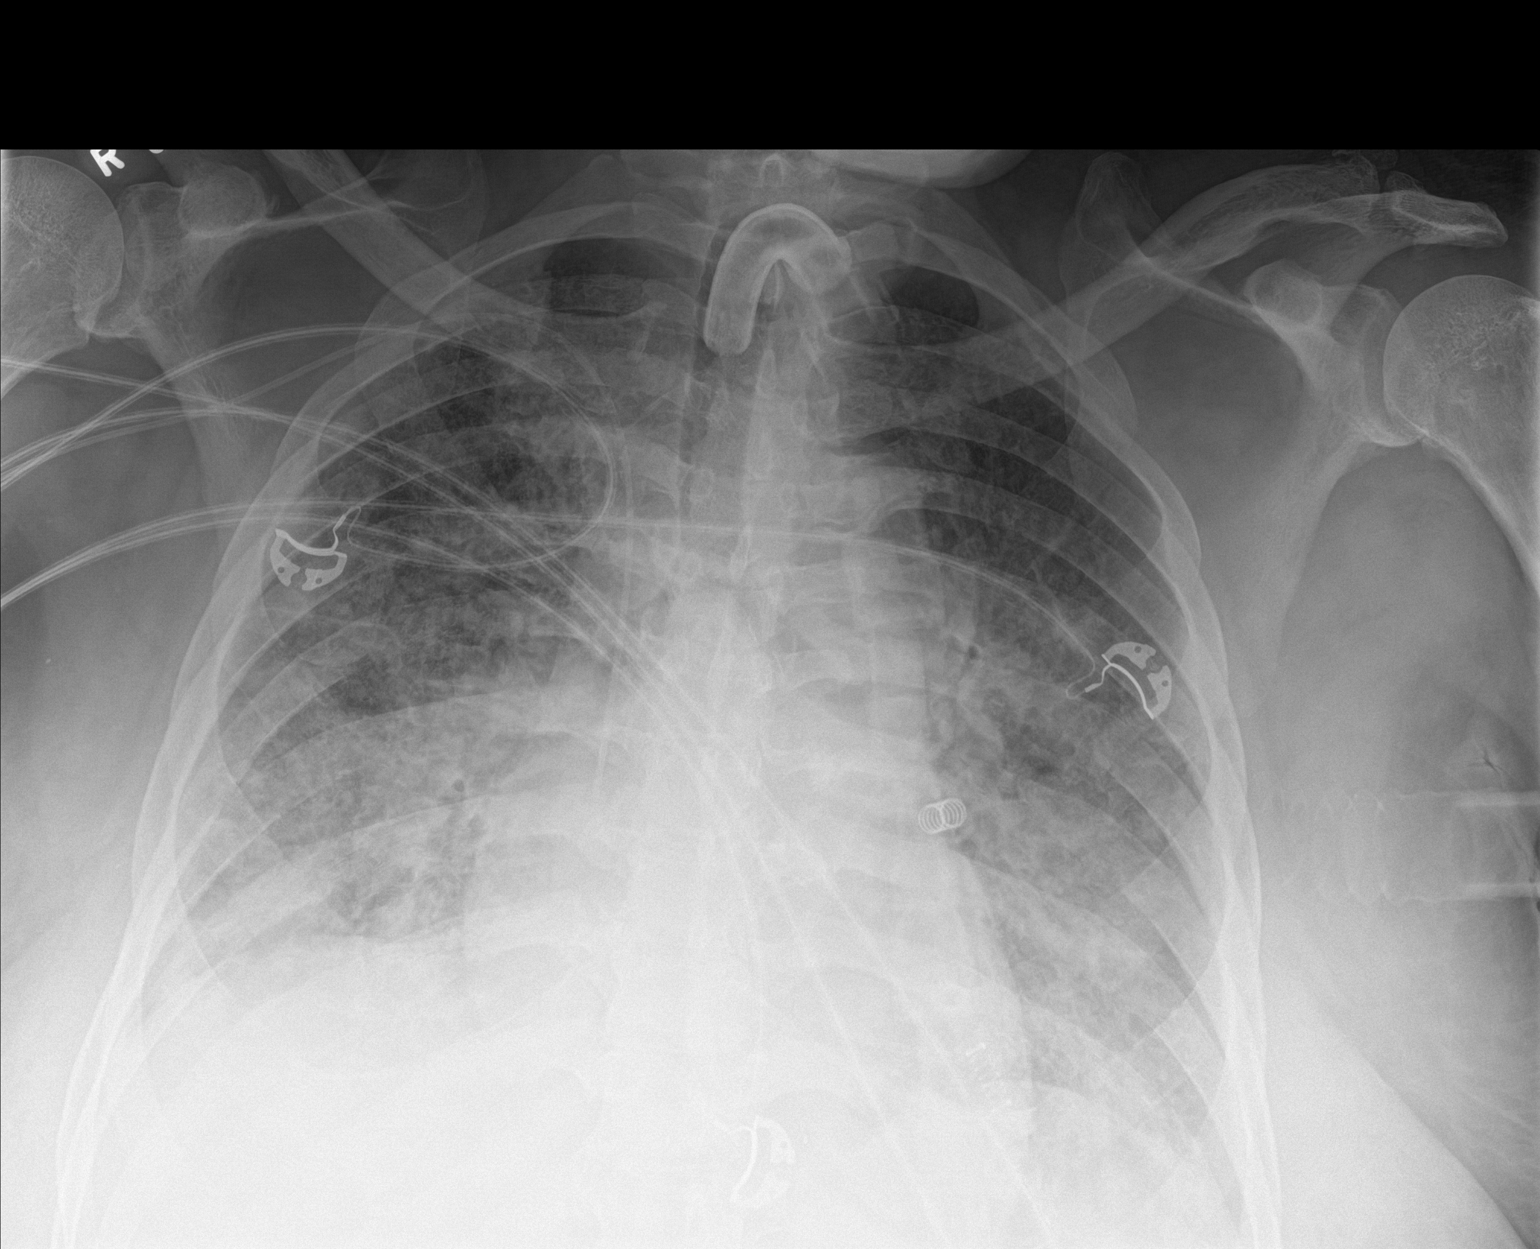
[im 2/2]
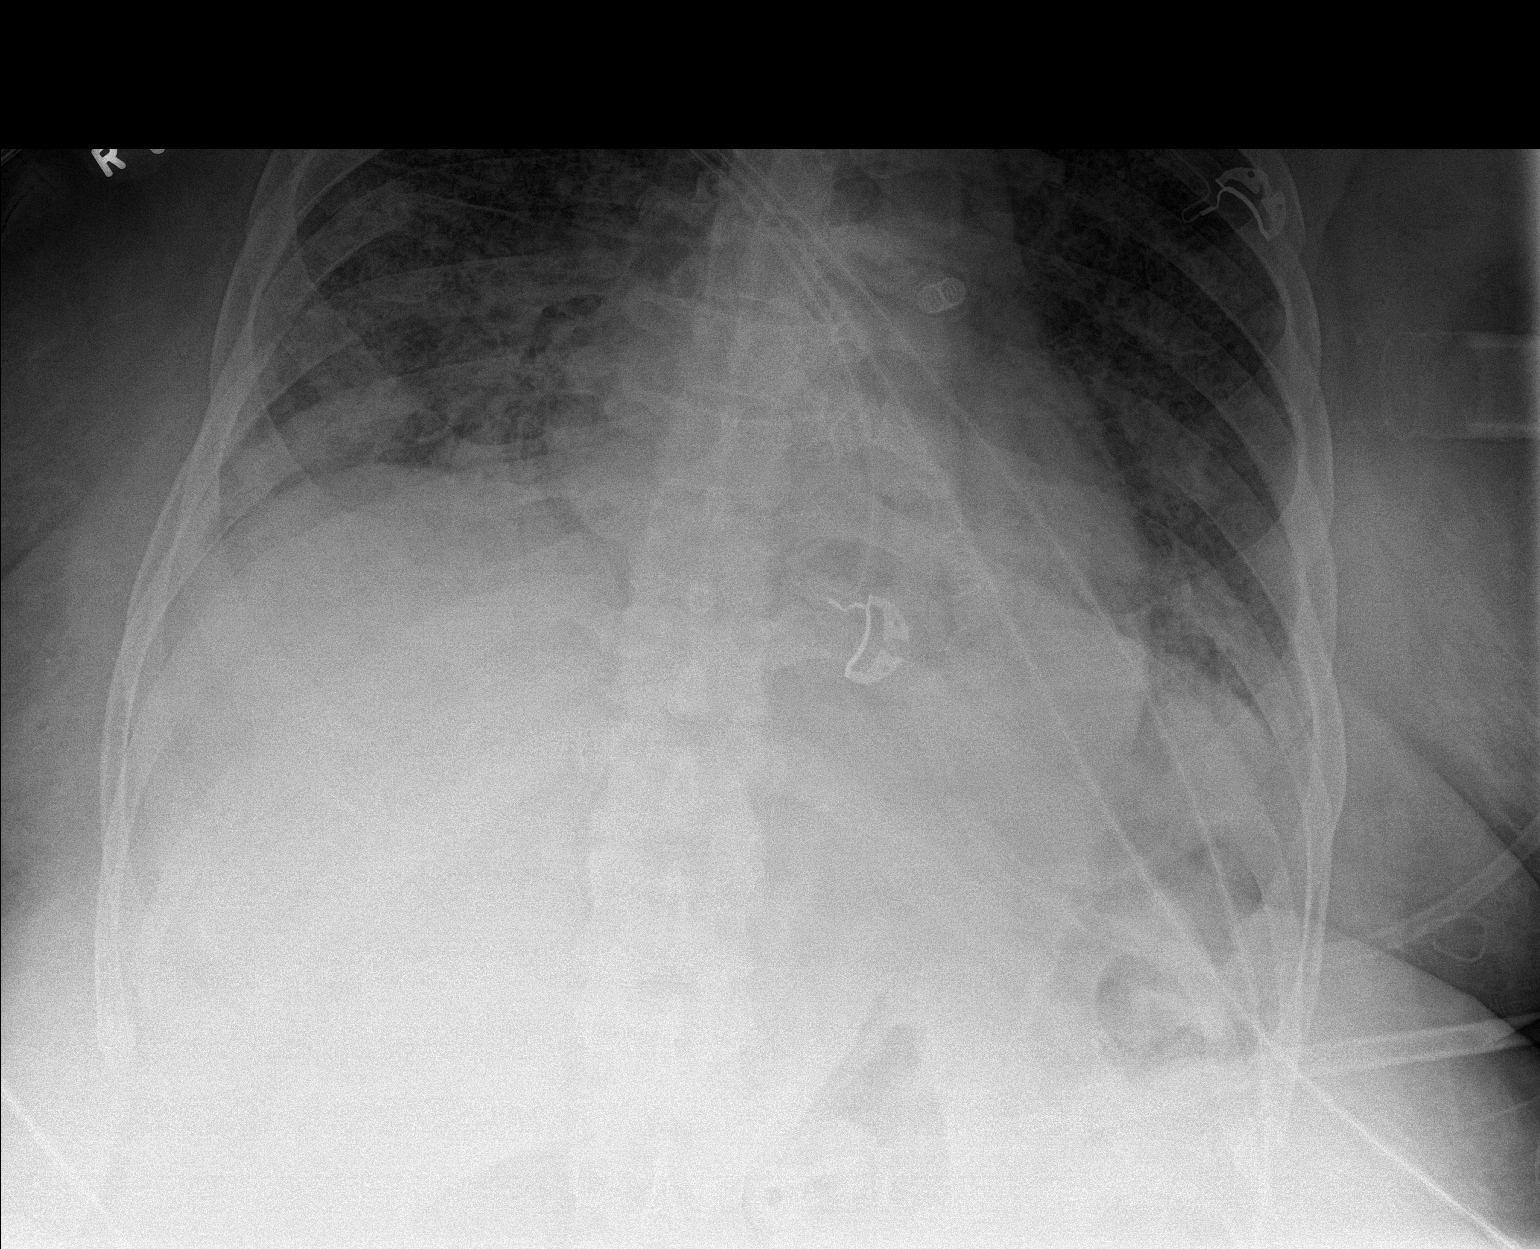

[2 of 2 positions shown; findings below may reference images not displayed]

FINDINGS: Tracheostomy tube tip overlies the tracheal air column at the
thoracic inlet. Right PICC terminates at the cavoatrial junction.
Stable cardiomediastinal silhouette with normal heart size. No
pneumothorax. No pleural effusion. Hazy airspace opacities
throughout both lungs are slightly improved.
IMPRESSION: Hazy airspace opacities throughout both lungs, slightly improved.

## 2019-01-09 IMAGING — DX DG CHEST 1V PORT
1 series · 1 of 1 positions shown · non-contrast
Comparison: 02/12/2018

CLINICAL DATA: Respiratory failure

EXAM:
PORTABLE CHEST 1 VIEW

[chest ap]
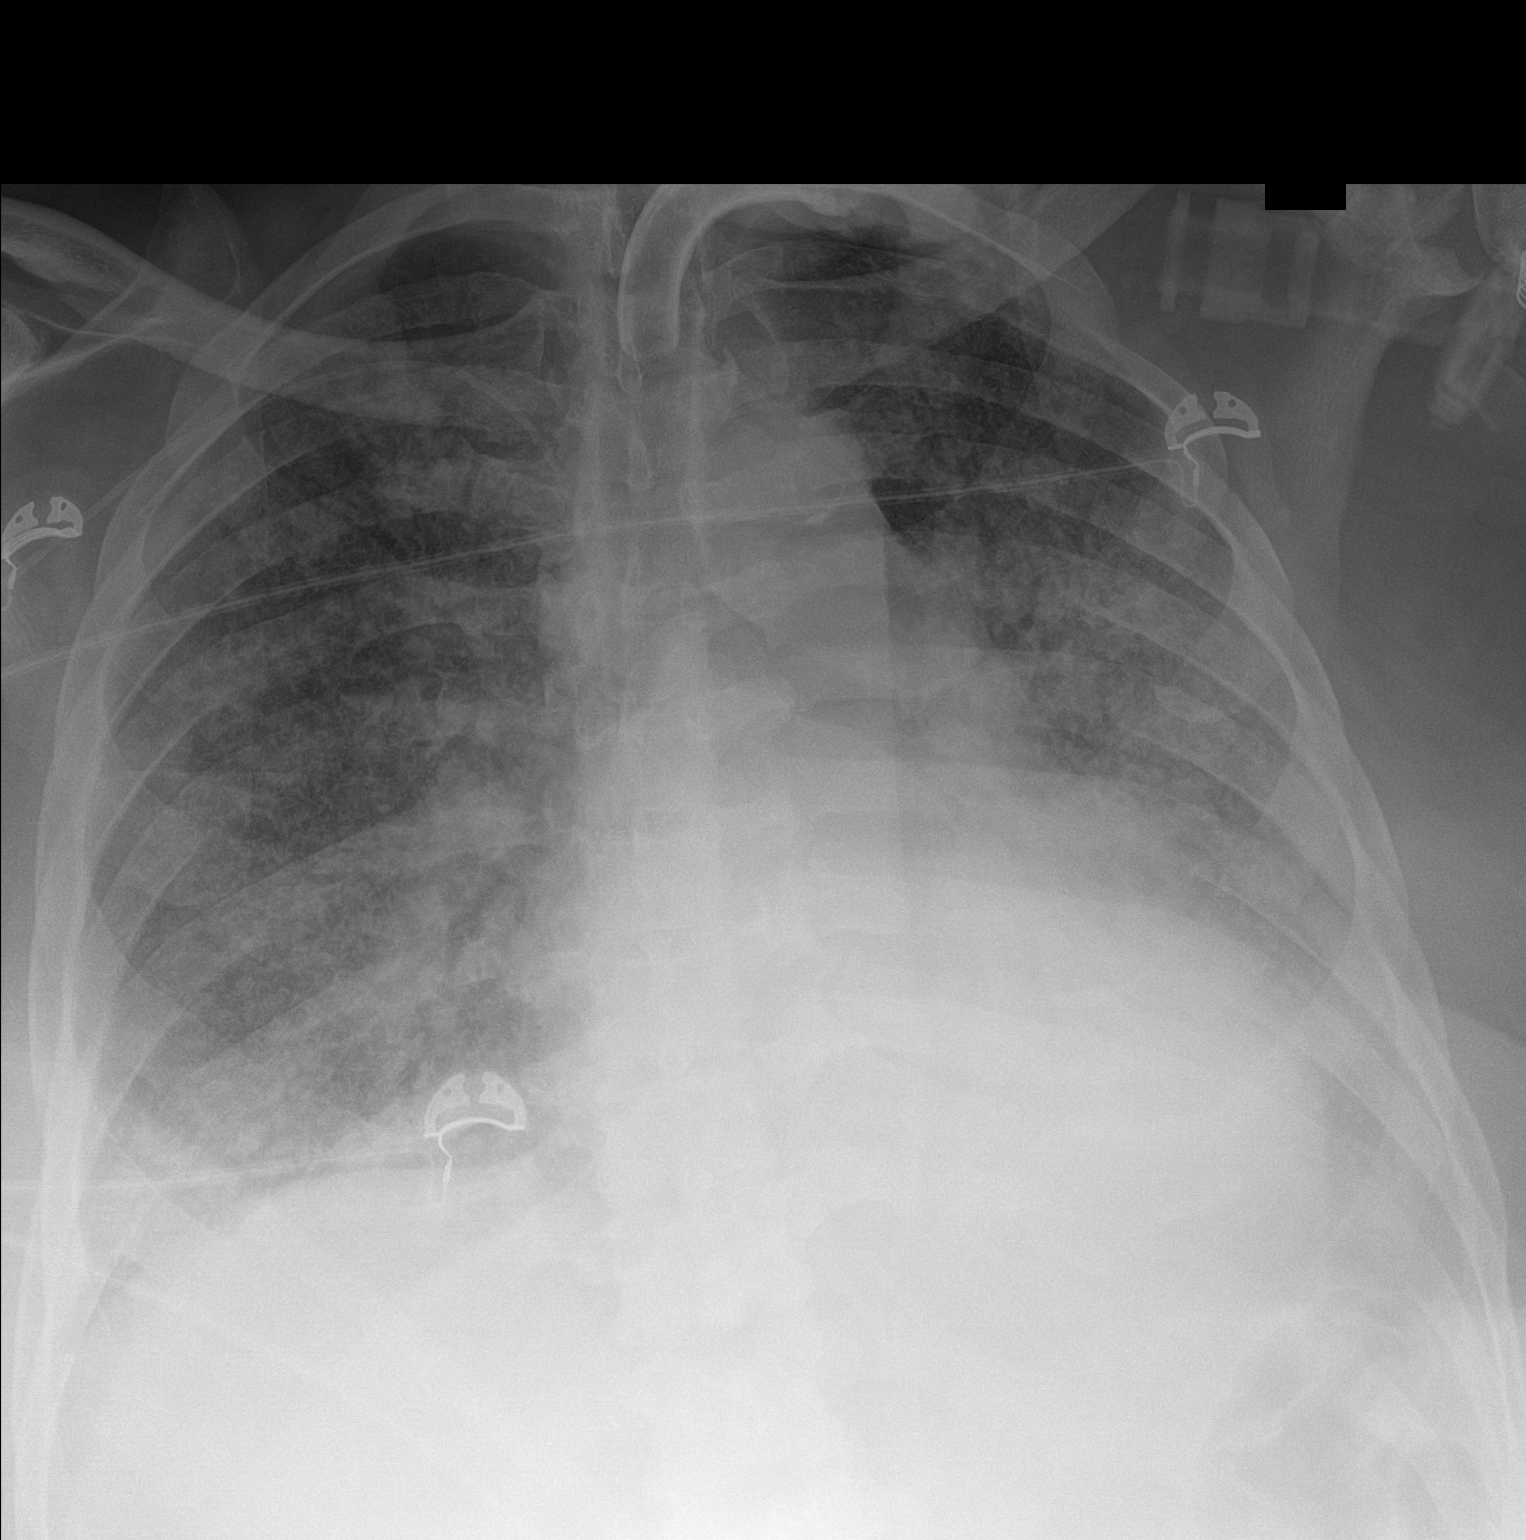

[1 of 1 positions shown; findings below may reference images not displayed]

FINDINGS: Tracheostomy is unchanged. Diffuse bilateral airspace opacities are
again noted. Cardiomegaly. No visible significant effusions. No
significant change since prior study.
IMPRESSION: Diffuse bilateral airspace disease could reflect edema or infection.
No change.

## 2019-08-24 ENCOUNTER — Emergency Department (HOSPITAL_COMMUNITY): Payer: Medicaid Other

## 2019-08-24 ENCOUNTER — Inpatient Hospital Stay (HOSPITAL_COMMUNITY)
Admission: EM | Admit: 2019-08-24 | Discharge: 2019-09-01 | DRG: 871 | Disposition: A | Discharge status: Skilled Nursing Facility | Payer: Medicaid Other | Source: Skilled Nursing Facility | Attending: Student in an Organized Health Care Education/Training Program | Admitting: Student in an Organized Health Care Education/Training Program

## 2019-08-24 ENCOUNTER — Inpatient Hospital Stay (HOSPITAL_COMMUNITY): Payer: Medicaid Other

## 2019-08-24 DIAGNOSIS — F319 Bipolar disorder, unspecified: Secondary | ICD-10-CM | POA: Diagnosis not present

## 2019-08-24 DIAGNOSIS — N390 Urinary tract infection, site not specified: Secondary | ICD-10-CM | POA: Diagnosis present

## 2019-08-24 DIAGNOSIS — J159 Unspecified bacterial pneumonia: Secondary | ICD-10-CM | POA: Diagnosis present

## 2019-08-24 DIAGNOSIS — J441 Chronic obstructive pulmonary disease with (acute) exacerbation: Secondary | ICD-10-CM | POA: Diagnosis present

## 2019-08-24 DIAGNOSIS — Z7951 Long term (current) use of inhaled steroids: Secondary | ICD-10-CM

## 2019-08-24 DIAGNOSIS — D6959 Other secondary thrombocytopenia: Secondary | ICD-10-CM | POA: Diagnosis present

## 2019-08-24 DIAGNOSIS — Z794 Long term (current) use of insulin: Secondary | ICD-10-CM | POA: Diagnosis not present

## 2019-08-24 DIAGNOSIS — Z930 Tracheostomy status: Secondary | ICD-10-CM | POA: Diagnosis not present

## 2019-08-24 DIAGNOSIS — Z79899 Other long term (current) drug therapy: Secondary | ICD-10-CM | POA: Diagnosis not present

## 2019-08-24 DIAGNOSIS — I110 Hypertensive heart disease with heart failure: Secondary | ICD-10-CM | POA: Diagnosis present

## 2019-08-24 DIAGNOSIS — I5022 Chronic systolic (congestive) heart failure: Secondary | ICD-10-CM | POA: Diagnosis present

## 2019-08-24 DIAGNOSIS — A419 Sepsis, unspecified organism: Secondary | ICD-10-CM | POA: Diagnosis present

## 2019-08-24 DIAGNOSIS — I2721 Secondary pulmonary arterial hypertension: Secondary | ICD-10-CM | POA: Diagnosis not present

## 2019-08-24 DIAGNOSIS — J189 Pneumonia, unspecified organism: Secondary | ICD-10-CM | POA: Diagnosis not present

## 2019-08-24 DIAGNOSIS — F209 Schizophrenia, unspecified: Secondary | ICD-10-CM | POA: Diagnosis present

## 2019-08-24 DIAGNOSIS — J181 Lobar pneumonia, unspecified organism: Secondary | ICD-10-CM | POA: Diagnosis not present

## 2019-08-24 DIAGNOSIS — F17210 Nicotine dependence, cigarettes, uncomplicated: Secondary | ICD-10-CM | POA: Diagnosis present

## 2019-08-24 DIAGNOSIS — Z8701 Personal history of pneumonia (recurrent): Secondary | ICD-10-CM

## 2019-08-24 DIAGNOSIS — J440 Chronic obstructive pulmonary disease with acute lower respiratory infection: Secondary | ICD-10-CM | POA: Diagnosis present

## 2019-08-24 DIAGNOSIS — J449 Chronic obstructive pulmonary disease, unspecified: Secondary | ICD-10-CM | POA: Diagnosis present

## 2019-08-24 DIAGNOSIS — E785 Hyperlipidemia, unspecified: Secondary | ICD-10-CM | POA: Diagnosis present

## 2019-08-24 DIAGNOSIS — J9621 Acute and chronic respiratory failure with hypoxia: Secondary | ICD-10-CM | POA: Diagnosis present

## 2019-08-24 DIAGNOSIS — K219 Gastro-esophageal reflux disease without esophagitis: Secondary | ICD-10-CM | POA: Diagnosis present

## 2019-08-24 DIAGNOSIS — F3130 Bipolar disorder, current episode depressed, mild or moderate severity, unspecified: Secondary | ICD-10-CM | POA: Diagnosis present

## 2019-08-24 DIAGNOSIS — Z9981 Dependence on supplemental oxygen: Secondary | ICD-10-CM | POA: Diagnosis not present

## 2019-08-24 DIAGNOSIS — E119 Type 2 diabetes mellitus without complications: Secondary | ICD-10-CM | POA: Diagnosis present

## 2019-08-24 DIAGNOSIS — J90 Pleural effusion, not elsewhere classified: Secondary | ICD-10-CM

## 2019-08-24 DIAGNOSIS — I5020 Unspecified systolic (congestive) heart failure: Secondary | ICD-10-CM

## 2019-08-24 DIAGNOSIS — D72829 Elevated white blood cell count, unspecified: Secondary | ICD-10-CM | POA: Diagnosis not present

## 2019-08-24 DIAGNOSIS — J9601 Acute respiratory failure with hypoxia: Secondary | ICD-10-CM

## 2019-08-24 DIAGNOSIS — I272 Pulmonary hypertension, unspecified: Secondary | ICD-10-CM | POA: Diagnosis present

## 2019-08-24 DIAGNOSIS — Z87440 Personal history of urinary (tract) infections: Secondary | ICD-10-CM | POA: Diagnosis not present

## 2019-08-24 DIAGNOSIS — E8809 Other disorders of plasma-protein metabolism, not elsewhere classified: Secondary | ICD-10-CM | POA: Diagnosis present

## 2019-08-24 DIAGNOSIS — Z20828 Contact with and (suspected) exposure to other viral communicable diseases: Secondary | ICD-10-CM | POA: Diagnosis present

## 2019-08-24 DIAGNOSIS — D696 Thrombocytopenia, unspecified: Secondary | ICD-10-CM | POA: Diagnosis present

## 2019-08-24 LAB — URINALYSIS, ROUTINE W REFLEX MICROSCOPIC
Bilirubin Urine: NEGATIVE
Glucose, UA: NEGATIVE mg/dL
Hgb urine dipstick: NEGATIVE
Ketones, ur: NEGATIVE mg/dL
Nitrite: NEGATIVE
Protein, ur: 30 mg/dL — AB
Specific Gravity, Urine: 1.027 (ref 1.005–1.030)
pH: 5 (ref 5.0–8.0)

## 2019-08-24 LAB — COMPREHENSIVE METABOLIC PANEL
ALT: 10 U/L (ref 0–44)
AST: 21 U/L (ref 15–41)
Albumin: 2.9 g/dL — ABNORMAL LOW (ref 3.5–5.0)
Alkaline Phosphatase: 50 U/L (ref 38–126)
Anion gap: 9 (ref 5–15)
BUN: 12 mg/dL (ref 6–20)
CO2: 26 mmol/L (ref 22–32)
Calcium: 9.3 mg/dL (ref 8.9–10.3)
Chloride: 102 mmol/L (ref 98–111)
Creatinine, Ser: 1.03 mg/dL (ref 0.61–1.24)
GFR calc Af Amer: >60 mL/min (ref >60)
GFR calc non Af Amer: >60 mL/min (ref >60)
Glucose, Bld: 140 mg/dL — ABNORMAL HIGH (ref 70–99)
Potassium: 4.3 mmol/L (ref 3.5–5.1)
Sodium: 137 mmol/L (ref 135–145)
Total Bilirubin: 0.8 mg/dL (ref 0.3–1.2)
Total Protein: 6.3 g/dL — ABNORMAL LOW (ref 6.5–8.1)

## 2019-08-24 LAB — CBC WITH DIFFERENTIAL/PLATELET
Abs Immature Granulocytes: 0.07 10*3/uL (ref 0.00–0.07)
Basophils Absolute: 0.1 10*3/uL (ref 0.0–0.1)
Basophils Relative: 0 %
Eosinophils Absolute: 0 10*3/uL (ref 0.0–0.5)
Eosinophils Relative: 0 %
HCT: 48.2 % (ref 39.0–52.0)
Hemoglobin: 14.6 g/dL (ref 13.0–17.0)
Immature Granulocytes: 1 %
Lymphocytes Relative: 10 %
Lymphs Abs: 1.4 10*3/uL (ref 0.7–4.0)
MCH: 26.4 pg (ref 26.0–34.0)
MCHC: 30.3 g/dL (ref 30.0–36.0)
MCV: 87.3 fL (ref 80.0–100.0)
Monocytes Absolute: 1.7 10*3/uL — ABNORMAL HIGH (ref 0.1–1.0)
Monocytes Relative: 12 %
Neutro Abs: 11.4 10*3/uL — ABNORMAL HIGH (ref 1.7–7.7)
Neutrophils Relative %: 77 %
Platelets: 144 10*3/uL — ABNORMAL LOW (ref 150–400)
RBC: 5.52 MIL/uL (ref 4.22–5.81)
RDW: 16.3 % — ABNORMAL HIGH (ref 11.5–15.5)
WBC: 14.7 10*3/uL — ABNORMAL HIGH (ref 4.0–10.5)
nRBC: 0 % (ref 0.0–0.2)

## 2019-08-24 LAB — POCT I-STAT 7, (LYTES, BLD GAS, ICA,H+H)
Acid-Base Excess: 1 mmol/L (ref 0.0–2.0)
Bicarbonate: 27.1 mmol/L (ref 20.0–28.0)
Calcium, Ion: 1.29 mmol/L (ref 1.15–1.40)
HCT: 46 % (ref 39.0–52.0)
Hemoglobin: 15.6 g/dL (ref 13.0–17.0)
O2 Saturation: 92 %
Potassium: 3.9 mmol/L (ref 3.5–5.1)
Sodium: 136 mmol/L (ref 135–145)
TCO2: 29 mmol/L (ref 22–32)
pCO2 arterial: 47.7 mmHg (ref 32.0–48.0)
pH, Arterial: 7.364 (ref 7.350–7.450)
pO2, Arterial: 67 mmHg — ABNORMAL LOW (ref 83.0–108.0)

## 2019-08-24 LAB — LACTATE DEHYDROGENASE: LDH: 270 U/L — ABNORMAL HIGH (ref 98–192)

## 2019-08-24 LAB — RESPIRATORY PANEL BY PCR

## 2019-08-24 LAB — CBG MONITORING, ED: Glucose-Capillary: 127 mg/dL — ABNORMAL HIGH (ref 70–99)

## 2019-08-24 LAB — LACTIC ACID, PLASMA
Lactic Acid, Venous: 2.1 mmol/L (ref 0.5–1.9)
Lactic Acid, Venous: 1.2 mmol/L (ref 0.5–1.9)

## 2019-08-24 LAB — SARS CORONAVIRUS 2 (TAT 6-24 HRS): SARS Coronavirus 2: NEGATIVE

## 2019-08-24 LAB — PROTEIN, TOTAL: Total Protein: 6.8 g/dL (ref 6.5–8.1)

## 2019-08-24 LAB — PROTIME-INR
INR: 1 (ref 0.8–1.2)
Prothrombin Time: 12.6 seconds (ref 11.4–15.2)

## 2019-08-24 LAB — HEMOGLOBIN A1C
Hgb A1c MFr Bld: 7.7 % — ABNORMAL HIGH (ref 4.8–5.6)
Mean Plasma Glucose: 174.29 mg/dL

## 2019-08-24 LAB — BRAIN NATRIURETIC PEPTIDE: B Natriuretic Peptide: 42.7 pg/mL (ref 0.0–100.0)

## 2019-08-24 LAB — POC SARS CORONAVIRUS 2 AG -  ED: SARS Coronavirus 2 Ag: NEGATIVE

## 2019-08-24 LAB — APTT: aPTT: 31 seconds (ref 24–36)

## 2019-08-24 LAB — GLUCOSE, CAPILLARY
Glucose-Capillary: 113 mg/dL — ABNORMAL HIGH (ref 70–99)
Glucose-Capillary: 124 mg/dL — ABNORMAL HIGH (ref 70–99)

## 2019-08-24 LAB — PROCALCITONIN: Procalcitonin: 0.75 ng/mL

## 2019-08-24 LAB — HIV ANTIBODY (ROUTINE TESTING W REFLEX): HIV Screen 4th Generation wRfx: NONREACTIVE

## 2019-08-24 LAB — DIGOXIN LEVEL: Digoxin Level: 0.6 ng/mL — ABNORMAL LOW (ref 0.8–2.0)

## 2019-08-24 MED ORDER — LIDOCAINE HCL 1 % IJ SOLN
INTRAMUSCULAR | Status: AC
  Filled 2019-08-24: qty 20

## 2019-08-24 MED ORDER — FUROSEMIDE 20 MG PO TABS
20.0000 mg | ORAL_TABLET | Freq: Every day | ORAL | Status: DC
  Administered 2019-08-24 – 2019-09-01 (×9): 20 mg via ORAL
  Filled 2019-08-24 (×8): qty 1

## 2019-08-24 MED ORDER — QUETIAPINE FUMARATE 200 MG PO TABS: 200.0000 mg | ORAL_TABLET | Freq: Every day | ORAL | Status: DC

## 2019-08-24 MED ORDER — GUAIFENESIN 200 MG PO TABS
200.0000 mg | ORAL_TABLET | Freq: Four times a day (QID) | ORAL | Status: DC
  Administered 2019-08-24 – 2019-09-01 (×33): 200 mg via ORAL
  Filled 2019-08-24 (×36): qty 1

## 2019-08-24 MED ORDER — VALPROIC ACID 250 MG PO CAPS
500.0000 mg | ORAL_CAPSULE | Freq: Two times a day (BID) | ORAL | Status: DC
  Administered 2019-08-24 – 2019-08-26 (×5): 500 mg via ORAL
  Filled 2019-08-24 (×5): qty 2

## 2019-08-24 MED ORDER — SODIUM CHLORIDE 0.9 % IV SOLN
2.0000 g | Freq: Once | INTRAVENOUS | Status: AC
  Administered 2019-08-24: 2 g via INTRAVENOUS
  Filled 2019-08-24: qty 2

## 2019-08-24 MED ORDER — SODIUM CHLORIDE 0.9 % IV SOLN
1000.0000 mL | INTRAVENOUS | Status: AC
  Administered 2019-08-24: 1000 mL via INTRAVENOUS

## 2019-08-24 MED ORDER — MAGNESIUM OXIDE 400 (241.3 MG) MG PO TABS: 400.0000 mg | ORAL_TABLET | Freq: Two times a day (BID) | ORAL | Status: DC

## 2019-08-24 MED ORDER — BUDESONIDE 0.5 MG/2ML IN SUSP
0.5000 mg | Freq: Two times a day (BID) | RESPIRATORY_TRACT | Status: DC
  Administered 2019-08-24 – 2019-09-01 (×17): 0.5 mg via RESPIRATORY_TRACT
  Filled 2019-08-24 (×20): qty 2

## 2019-08-24 MED ORDER — IPRATROPIUM-ALBUTEROL 0.5-2.5 (3) MG/3ML IN SOLN: 3.0000 mL | RESPIRATORY_TRACT | Status: DC | PRN

## 2019-08-24 MED ORDER — METOPROLOL TARTRATE 25 MG PO TABS: 12.5000 mg | ORAL_TABLET | Freq: Four times a day (QID) | ORAL | Status: DC

## 2019-08-24 MED ORDER — BISACODYL 10 MG RE SUPP: 10.0000 mg | Freq: Every day | RECTAL | Status: DC | PRN

## 2019-08-24 MED ORDER — INSULIN ASPART 100 UNIT/ML ~~LOC~~ SOLN
0.0000 [IU] | Freq: Every day | SUBCUTANEOUS | Status: DC
  Administered 2019-08-31: 2 [IU] via SUBCUTANEOUS

## 2019-08-24 MED ORDER — LISINOPRIL 10 MG PO TABS: 10.0000 mg | ORAL_TABLET | Freq: Every day | ORAL | Status: DC

## 2019-08-24 MED ORDER — CLONAZEPAM 0.5 MG PO TABS: 0.2500 mg | ORAL_TABLET | Freq: Two times a day (BID) | ORAL | Status: DC

## 2019-08-24 MED ORDER — FUROSEMIDE 20 MG PO TABS: 20.0000 mg | ORAL_TABLET | Freq: Every day | ORAL | Status: DC

## 2019-08-24 MED ORDER — ONDANSETRON HCL 4 MG PO TABS: 4.0000 mg | ORAL_TABLET | Freq: Four times a day (QID) | ORAL | Status: DC | PRN

## 2019-08-24 MED ORDER — INSULIN GLARGINE 100 UNIT/ML ~~LOC~~ SOLN
20.0000 [IU] | Freq: Every day | SUBCUTANEOUS | Status: DC
  Filled 2019-08-24: qty 0.2

## 2019-08-24 MED ORDER — LISINOPRIL 10 MG PO TABS
10.0000 mg | ORAL_TABLET | Freq: Every day | ORAL | Status: DC
  Administered 2019-08-24 – 2019-09-01 (×9): 10 mg via ORAL
  Filled 2019-08-24 (×9): qty 1

## 2019-08-24 MED ORDER — FLUOXETINE HCL 20 MG PO CAPS
20.0000 mg | ORAL_CAPSULE | Freq: Every day | ORAL | Status: DC
  Administered 2019-08-24 – 2019-09-01 (×9): 20 mg via ORAL
  Filled 2019-08-24 (×10): qty 1

## 2019-08-24 MED ORDER — SODIUM CHLORIDE 0.9 % IV SOLN
1000.0000 mL | INTRAVENOUS | Status: DC
  Administered 2019-08-24: 1000 mL via INTRAVENOUS

## 2019-08-24 MED ORDER — VANCOMYCIN HCL 10 G IV SOLR
2000.0000 mg | Freq: Once | INTRAVENOUS | Status: AC
  Administered 2019-08-24: 2000 mg via INTRAVENOUS
  Filled 2019-08-24: qty 2000

## 2019-08-24 MED ORDER — ACETAMINOPHEN 650 MG RE SUPP: 650.0000 mg | Freq: Four times a day (QID) | RECTAL | Status: DC | PRN

## 2019-08-24 MED ORDER — PANTOPRAZOLE SODIUM 40 MG PO TBEC
40.0000 mg | DELAYED_RELEASE_TABLET | Freq: Every day | ORAL | Status: DC
  Administered 2019-08-24 – 2019-09-01 (×9): 40 mg via ORAL
  Filled 2019-08-24 (×9): qty 1

## 2019-08-24 MED ORDER — SODIUM CHLORIDE 0.9 % IV BOLUS (SEPSIS)
1000.0000 mL | Freq: Once | INTRAVENOUS | Status: AC
  Administered 2019-08-24: 1000 mL via INTRAVENOUS

## 2019-08-24 MED ORDER — VANCOMYCIN HCL 10 G IV SOLR
1250.0000 mg | Freq: Two times a day (BID) | INTRAVENOUS | Status: DC
  Administered 2019-08-24 – 2019-08-25 (×2): 1250 mg via INTRAVENOUS
  Filled 2019-08-24 (×4): qty 1250

## 2019-08-24 MED ORDER — FREE WATER: 200.0000 mL | Freq: Three times a day (TID) | Status: DC

## 2019-08-24 MED ORDER — FLUOXETINE HCL 20 MG/5ML PO SOLN: 20.0000 mg | Freq: Every day | ORAL | Status: DC

## 2019-08-24 MED ORDER — DOCUSATE SODIUM 50 MG/5ML PO LIQD
100.0000 mg | Freq: Two times a day (BID) | ORAL | Status: DC | PRN
  Filled 2019-08-24: qty 10

## 2019-08-24 MED ORDER — MAGNESIUM OXIDE 400 (241.3 MG) MG PO TABS
400.0000 mg | ORAL_TABLET | Freq: Two times a day (BID) | ORAL | Status: DC
  Administered 2019-08-24 – 2019-09-01 (×16): 400 mg via ORAL
  Filled 2019-08-24 (×17): qty 1

## 2019-08-24 MED ORDER — GUAIFENESIN 100 MG/5ML PO SOLN
10.0000 mL | Freq: Four times a day (QID) | ORAL | Status: DC
  Filled 2019-08-24 (×2): qty 10

## 2019-08-24 MED ORDER — ACETYLCYSTEINE 20 % IN SOLN
4.0000 mL | Freq: Two times a day (BID) | RESPIRATORY_TRACT | Status: DC | PRN
  Administered 2019-08-31: 4 mL via RESPIRATORY_TRACT
  Filled 2019-08-24 (×5): qty 4

## 2019-08-24 MED ORDER — SODIUM CHLORIDE 0.9 % IV SOLN
2.0000 g | Freq: Three times a day (TID) | INTRAVENOUS | Status: AC
  Administered 2019-08-24 – 2019-08-28 (×14): 2 g via INTRAVENOUS
  Filled 2019-08-24 (×14): qty 2

## 2019-08-24 MED ORDER — QUETIAPINE FUMARATE 25 MG PO TABS
200.0000 mg | ORAL_TABLET | Freq: Every day | ORAL | Status: DC
  Administered 2019-08-24 – 2019-08-31 (×8): 200 mg via ORAL
  Filled 2019-08-24 (×8): qty 8
  Filled 2019-08-24: qty 1

## 2019-08-24 MED ORDER — ENOXAPARIN SODIUM 40 MG/0.4ML ~~LOC~~ SOLN: 40.0000 mg | SUBCUTANEOUS | Status: DC

## 2019-08-24 MED ORDER — ACETAMINOPHEN 325 MG PO TABS: 650.0000 mg | ORAL_TABLET | Freq: Four times a day (QID) | ORAL | Status: DC | PRN

## 2019-08-24 MED ORDER — GLUCERNA 1.5 CAL PO LIQD: 1000.0000 mL | ORAL | Status: DC

## 2019-08-24 MED ORDER — CLONAZEPAM 0.25 MG PO TBDP
0.2500 mg | ORAL_TABLET | Freq: Two times a day (BID) | ORAL | Status: DC
  Administered 2019-08-24 – 2019-09-01 (×16): 0.25 mg via ORAL
  Filled 2019-08-24 (×16): qty 1

## 2019-08-24 MED ORDER — DOCUSATE SODIUM 100 MG PO CAPS: 100.0000 mg | ORAL_CAPSULE | Freq: Two times a day (BID) | ORAL | Status: DC | PRN

## 2019-08-24 MED ORDER — VALPROIC ACID 250 MG/5ML PO SOLN: 500.0000 mg | Freq: Two times a day (BID) | ORAL | Status: DC

## 2019-08-24 MED ORDER — INSULIN ASPART 100 UNIT/ML ~~LOC~~ SOLN
0.0000 [IU] | Freq: Three times a day (TID) | SUBCUTANEOUS | Status: DC
  Administered 2019-08-24: 2 [IU] via SUBCUTANEOUS
  Administered 2019-08-25: 5 [IU] via SUBCUTANEOUS
  Administered 2019-08-25: 13:00:00 3 [IU] via SUBCUTANEOUS
  Administered 2019-08-26: 5 [IU] via SUBCUTANEOUS
  Administered 2019-08-26 – 2019-08-27 (×3): 2 [IU] via SUBCUTANEOUS
  Administered 2019-08-27: 3 [IU] via SUBCUTANEOUS
  Administered 2019-08-27: 8 [IU] via SUBCUTANEOUS
  Administered 2019-08-28: 5 [IU] via SUBCUTANEOUS
  Administered 2019-08-28: 3 [IU] via SUBCUTANEOUS
  Administered 2019-08-29: 5 [IU] via SUBCUTANEOUS
  Administered 2019-08-29 – 2019-08-30 (×3): 3 [IU] via SUBCUTANEOUS
  Administered 2019-08-30: 2 [IU] via SUBCUTANEOUS
  Administered 2019-08-31 – 2019-09-01 (×2): 3 [IU] via SUBCUTANEOUS

## 2019-08-24 MED ORDER — VANCOMYCIN HCL IN DEXTROSE 1-5 GM/200ML-% IV SOLN: 1000.0000 mg | Freq: Once | INTRAVENOUS | Status: DC

## 2019-08-24 MED ORDER — SODIUM CHLORIDE 3 % IN NEBU
4.0000 mL | INHALATION_SOLUTION | Freq: Every day | RESPIRATORY_TRACT | Status: AC
  Administered 2019-08-24 – 2019-08-26 (×3): 4 mL via RESPIRATORY_TRACT
  Filled 2019-08-24 (×3): qty 4

## 2019-08-24 MED ORDER — DIGOXIN 125 MCG PO TABS
0.2500 mg | ORAL_TABLET | Freq: Every day | ORAL | Status: DC
  Administered 2019-08-24 – 2019-09-01 (×6): 0.25 mg via ORAL
  Filled 2019-08-24 (×8): qty 2

## 2019-08-24 MED ORDER — ONDANSETRON HCL 4 MG/2ML IJ SOLN: 4.0000 mg | Freq: Four times a day (QID) | INTRAMUSCULAR | Status: DC | PRN

## 2019-08-24 MED ORDER — ACETYLCYSTEINE 10% NICU INHALATION SOLUTION: 4.0000 mL | Freq: Two times a day (BID) | RESPIRATORY_TRACT | Status: DC | PRN

## 2019-08-24 MED ORDER — INSULIN GLARGINE 100 UNIT/ML ~~LOC~~ SOLN
15.0000 [IU] | Freq: Every day | SUBCUTANEOUS | Status: DC
  Administered 2019-08-24 – 2019-08-31 (×8): 15 [IU] via SUBCUTANEOUS
  Filled 2019-08-24 (×10): qty 0.15

## 2019-08-24 MED ORDER — ENOXAPARIN SODIUM 60 MG/0.6ML ~~LOC~~ SOLN
0.5000 mg/kg | SUBCUTANEOUS | Status: DC
  Administered 2019-08-24 – 2019-08-25 (×2): 60 mg via SUBCUTANEOUS
  Filled 2019-08-24 (×3): qty 0.6

## 2019-08-24 MED ORDER — METRONIDAZOLE IN NACL 5-0.79 MG/ML-% IV SOLN
500.0000 mg | Freq: Once | INTRAVENOUS | Status: AC
  Administered 2019-08-24: 500 mg via INTRAVENOUS
  Filled 2019-08-24: qty 100

## 2019-08-24 MED ORDER — SCOPOLAMINE 1 MG/3DAYS TD PT72: 1.0000 | MEDICATED_PATCH | TRANSDERMAL | Status: DC

## 2019-08-24 MED ORDER — DIGOXIN 125 MCG PO TABS: 0.2500 mg | ORAL_TABLET | Freq: Every day | ORAL | Status: DC

## 2019-08-24 MED ORDER — GABAPENTIN 250 MG/5ML PO SOLN: 200.0000 mg | Freq: Two times a day (BID) | ORAL | Status: DC

## 2019-08-24 MED ORDER — GABAPENTIN 100 MG PO CAPS
200.0000 mg | ORAL_CAPSULE | Freq: Two times a day (BID) | ORAL | Status: DC
  Administered 2019-08-24 – 2019-09-01 (×16): 200 mg via ORAL
  Filled 2019-08-24 (×16): qty 2

## 2019-08-24 MED ORDER — METOPROLOL TARTRATE 12.5 MG HALF TABLET
12.5000 mg | ORAL_TABLET | Freq: Four times a day (QID) | ORAL | Status: DC
  Administered 2019-08-24 – 2019-08-26 (×10): 12.5 mg via ORAL
  Filled 2019-08-24 (×12): qty 1

## 2019-08-24 MED ORDER — PANTOPRAZOLE SODIUM 40 MG PO PACK
40.0000 mg | PACK | Freq: Every day | ORAL | Status: DC
  Filled 2019-08-24: qty 20

## 2019-08-24 MED ORDER — FUROSEMIDE 10 MG/ML IJ SOLN
40.0000 mg | Freq: Once | INTRAMUSCULAR | Status: AC
  Administered 2019-08-24: 40 mg via INTRAVENOUS
  Filled 2019-08-24: qty 4

## 2019-08-24 NOTE — Progress Notes (Signed)
Patient presents for therapeutic and diagnostic right sided thoracentesis. [place] limited chest shows trace amount of pleural fluid notes insufficient to perform a safe right sided thoracentesis. Procedure not performed.

## 2019-08-24 NOTE — Progress Notes (Signed)
Changed trach mask to closed suction t-bar with balard with attached hepa filter to prevent spread of aerosolized germs.  No complications noted.  Vital signs are stable.

## 2019-08-24 NOTE — ED Provider Notes (Signed)
Physical Exam  BP (!) 122/93   Pulse 95   Temp (!) 101.4 F (38.6 C)   Resp (!) 25   SpO2 94%   Physical Exam  ED Course/Procedures   Clinical Course as of Aug 24 743  [facility] Aug 24, 2019  0641 Pt with SPO2 92% on 60% via trach collar.    [HM]  0641 Hazy opacities.  DG Chest Port 1 View [HM]  [phone] Febrile here.  Patient received 650 mg of Tylenol around 530.  Will monitor.  Temp(!): 101.4 F (38.6 C) [HM]    Clinical Course User Index [HM] Muthersbaugh, Boyd Kerbs    Procedures  MDM   Patient signed out to me by H. Muthersbaugh, PA-C. Please see previous notes for further history.  In brief, patient presenting for evaluation of hypoxia and fever.  Physical exam shows patient who appears in no acute distress.  However, he is mildly tachypneic.  He has a trach collar at 60%, satting well.  Patient fell out of bed this morning, when staff checked his vital signs, his oxygen was at 50% on room air.  Additionally, he was found to be febrile.  He was placed on nonrebreather during transport, sats improved.  Transfer from EMS to the hospital bed resulted in a drop of sats to 70%.  Patient states he is feeling fine, denies shortness of breath at this time.  No known Covid contacts.  Patient with a history of COPD.  Documented history of CHF, however patient is on diuretics.  Patient has a trach because in 2019 he had a long hospitalization where they were unable to wean him off the vent.  He has recently been doing better, has had discussions about decannulation.  He was satting at 95% on room air at his last ED visit 2 weeks ago.  Patient will need to be admitted due to his hypoxia.  He is pending labs, x-ray, and Covid test.  Likely Covid due to his severe hypoxia in a patient who appears well.  Code sepsis was called by previous provider.  No fluids were given due to stable blood pressure and no sign of septic shock.  Labs show leukocytosis at 14.7.  Otherwise reassuring.  Lactic is  mildly elevated at 2.1.  ABG has a PO2 of 67.  Otherwise reassuring.  Chest x-ray viewed interpreted by me, shows hazy opacities.  Radiology reads as likely CHF exacerbation, does not mention pneumonia or atypical pneumonia.  However patient remains well-appearing, I do not believe he needs BiPAP.  I still have high concern for Covid, rapid test pending.  Rapid Covid test negative.  However I have a high suspicion for Covid, as such patient will obtain the 6 to 24-hour test and be considered a PUI.  Will call for admission.  Discussed with IMTS, patient to be admitted.  He remains stable on the trach collar.  Krist[place]her [place] was evaluated in Emergency Department on 08/24/2019 for the symptoms described in the history of present illness. He was evaluated in the context of the global COVID-19 pandemic, which necessitated consideration that the patient might be at risk for infection with the SARS-CoV-2 virus that causes COVID-19. Institutional protocols and algorithms that pertain to the evaluation of patients at risk for COVID-19 are in a state of rapid change based on information released by regulatory bodies including the CDC and federal and state organizations. These policies and algorithms were followed during the patient's care in the ED.  Franchot Heidelberg, PA-C 08/24/19 1856    Wyvonnia Dusky, MD 08/24/19 1517

## 2019-08-24 NOTE — H&P (Addendum)
Date: 08/24/2019               Patient Name:  Tommy Mathews MRN: [id number]  DOB: July 11, 1961 Age / Sex: 58 y.o., male   PCP: Patient, [name]         Medical Service: Internal Medicine Teaching Service         Attending Physician: Dr. Reymundo PollGuilloud, Carolyn, MD    First Contact: Dr. Yetta BarreJones Pager: [phone]  Second Contact: Dr. Dortha SchwalbeAgyei Pager: [phone]       After Hours (After 5p/  First Contact Pager: [phone]  weekends / holidays): Second Contact Pager: [phone]   Chief Complaint: Hypoxia   History of Present Illness: Tommy PangJames Gause is a 58 y.o male [facility] respiratory failure s/p tracheostomy in 12/2017 after a prolonged admission for acute hypoxic respiratory failure in the setting of sepsis, diabetes mellitus, schizophrenia, and bipolar who presented to the ED with hypoxia. History per the patient was limited due to his hypoxia and was primarily obtained using yes/no questions. Supplemental history was obtained through chart review and [facility].   Patient was in his usual state of health until this AM when he subsequently fell out of bed. He was not injured with the fall, did not hit his head, or experience LOC. The nurse checked his vital signs to find him febrile and hypoxic with an SaO2 of ~50%. The patient was placed on supplemental oxygen (8L/min), given albuterol, and EMS was called. With these interventions the patient's SaO2 improved to 90%. Upon arrival of EMS the patient was found sitting in a wheel chair in no immediate distress. Initial vitals per EMS were; HR 120, RR 36, SPO2 72% and T 101.15F. He was given 625mg  of Tylenol and transported to the ED for further evaluation. Per the patient, he has been feeling well and denies headaches, sinus congestion, subjective fevers/chills, visual changes, neck stiffness, SHOB, chest pain, abdominal pain, diarrhea, constipation, myalgias, arthralgias, rash, bed sores. He does  acknowledge some dysuria recently.   Meds:  No current facility-administered medications on file prior to encounter.    Current Outpatient Medications on File Prior to Encounter  Medication Sig Dispense Refill   budesonide (PULMICORT) 0.5 MG/2ML nebulizer solution Take 2 mLs (0.5 mg total) by nebulization 2 (two) times daily.  12   clobetasol cream (TEMOVATE) 0.05 % Apply 1 application topically 2 (two) times daily. Apply to back and chest     digoxin (LANOXIN) 0.25 MG tablet Place 1 tablet (0.25 mg total) into feeding tube daily.     divalproex (DEPAKOTE) 500 MG DR tablet Take 500 mg by mouth 2 (two) times daily.     escitalopram (LEXAPRO) 5 MG tablet Take 5 mg by mouth daily.     furosemide (LASIX) 20 MG tablet Place 1 tablet (20 mg total) into feeding tube daily. 30 tablet    lisinopril (PRINIVIL,ZESTRIL) 10 MG tablet Place 1 tablet (10 mg total) into feeding tube daily. Please hold for BP < 130 mmhg     pantoprazole sodium (PROTONIX) 40 mg/20 mL PACK Place 20 mLs (40 mg total) into feeding tube daily. (Patient taking differently: Take 40 mg by mouth daily. 40 mg tablet daily) 30 each    QUEtiapine (SEROQUEL) 200 MG tablet Place 1 tablet (200 mg total) into feeding tube at bedtime. (Patient taking differently: Take 200 mg by mouth daily. )     acetylcysteine (MUCOMYST) 10% SOLN Take 4 mLs  by nebulization every 12 (twelve) hours as needed (secretions).     bisacodyl (DULCOLAX) 10 MG suppository Place 1 suppository (10 mg total) rectally daily as needed for moderate constipation. 12 suppository 0   clonazePAM (KLONOPIN) 0.5 MG tablet Take 0.5 tablets (0.25 mg total) by mouth 2 (two) times daily. 30 tablet 0   docusate (COLACE) 50 MG/5ML liquid Place 10 mLs (100 mg total) into feeding tube 2 (two) times daily as needed for mild constipation. 100 mL 0   docusate (COLACE) 50 MG/5ML liquid Place 5 mLs (50 mg total) into feeding tube 2 (two) times daily. 100 mL 0   FLUoxetine (PROZAC)  20 MG/5ML solution Place 5 mLs (20 mg total) into feeding tube daily. 120 mL 0   gabapentin (NEURONTIN) 250 MG/5ML solution Place 4 mLs (200 mg total) into feeding tube every 12 (twelve) hours.  12   guaiFENesin (ROBITUSSIN) 100 MG/5ML SOLN Place 10 mLs (200 mg total) into feeding tube 4 (four) times daily. 1200 mL 0   insulin glargine (LANTUS) 100 UNIT/ML injection Inject 0.2 mLs (20 Units total) into the skin at bedtime. 10 mL 11   ipratropium-albuterol (DUONEB) 0.5-2.5 (3) MG/3ML SOLN Take 3 mLs by nebulization every 4 (four) hours as needed. 360 mL    ipratropium-albuterol (DUONEB) 0.5-2.5 (3) MG/3ML SOLN Take 3 mLs by nebulization every 4 (four) hours. 360 mL    ketoconazole (NIZORAL) 2 % cream Apply topically daily as needed for irritation. 15 g 0   magnesium oxide (MAG-OX) 400 (241.3 Mg) MG tablet Place 1 tablet (400 mg total) into feeding tube 2 (two) times daily.     metoprolol tartrate (LOPRESSOR) 25 MG tablet Place 0.5 tablets (12.5 mg total) into feeding tube every 6 (six) hours. Please hold for BP < 130 mmhg     mouth rinse LIQD solution 15 mLs by Mouth Rinse route daily. 10 times per day.  0   Nutritional Supplements (FEEDING SUPPLEMENT, GLUCERNA 1.5 CAL,) LIQD Place 1,000 mLs into feeding tube continuous.     nystatin (MYCOSTATIN) 100000 UNIT/ML suspension Take 5 mLs (500,000 Units total) by mouth 4 (four) times daily. 60 mL 0   QUEtiapine (SEROQUEL) 400 MG tablet Place 1 tablet (400 mg total) into feeding tube daily.     scopolamine (TRANSDERM-SCOP) 1 MG/3DAYS Place 1 patch (1.5 mg total) onto the skin every 3 (three) days. 10 patch 12   sodium chloride HYPERTONIC 3 % nebulizer solution Take 4 mLs by nebulization daily. 750 mL 12   valproic acid (DEPAKENE) 250 MG/5ML SOLN solution Place 10 mLs (500 mg total) into feeding tube 2 (two) times daily. (Patient not taking: Reported on 08/24/2019) 600 mL    Water For Irrigation, Sterile (FREE WATER) SOLN Place 200 mLs into  feeding tube every 8 (eight) hours.     Allergies: Allergies as of 08/24/2019   (No Known Allergies)   Past Medical History:  Diagnosis Date   Diabetes (HCC)    GERD (gastroesophageal reflux disease)    HLD (hyperlipidemia)    HTN (hypertension)    Unable to obtain Family history as patient was unable to answer questions secondary to his acute hypoxic respiratory failure.   Unable to obtain Social history as patient was unable to answer questions secondary to his acute hypoxic respiratory failure.  Review of Systems: A complete ROS was negative except as per HPI.  Physical Exam: Blood pressure 121/80, pulse 87, temperature 99 F (37.2 C), temperature source Oral, resp. rate (!) 23, height 6'  1" (1.854 m), weight 120 kg, SpO2 95 %.  General: Obese male in no acute distress HENT: Normocephalic, atraumatic, moist mucus membranes Pulm: Good air movement with diffuse rhonchi throughout   CV: RRR, no murmurs, no rubs  Abdomen: Active bowel sounds, soft, non-distended, no tenderness to palpation  Extremities: Pulses palpable in all extremities, no LE edema  Skin: Warm and dry  Neuro: Alert and oriented x 3  EKG: personally reviewed: my interpretation is sinus rhythm with normal axis. Normal P wave morphology.   CXR: personally reviewed my interpretation is rotated. Trach in place. Diffuse bilateral infiltrates.   Assessment & Plan by Problem: Active Problems:   Acute respiratory failure with hypoxia [facility])  Jesaiah Fabiano is a 58 y.o male who currently lives at [facility] with chronic respiratory failure s/p tracheostomy in 12/2017 after a prolonged admission for acute hypoxic respiratory failure in the setting of sepsis, HFrEF, diabetes mellitus, schizophrenia, and bipolar who presented to the ED with acute hypoxic respiratory failure 2/2 to sepsis likely secondary to pulmonary source. He was subsequently admitted for further evaluation and management.   Acute Hypoxic  Respiratory Failure  Sepsis likely secondary to pulmonary source  Chronic respiratory failure s/p tracheostomy in 12/2017 - Patient presented with tachycardia, tachypneia, and temperature of 101.44F with a leukocytosis and diffuse bilateral infiltrates on CXR. BNP < 100 ruling out CHF for the cause of his bilateral pulmonary infiltrates. He is at risk for MDR organisms; therefore, we will continue Vancomycin and Cefepime. Initial COVID negative but give high suspicion we will re-check COVID, RVP, and procalcitonin. If COVID returns positive he will need to be started on Dexamethasone   - Does have symptoms consistent with UTI and UA does have pyuria and rare bacteruria. This will be covered with broad spectrum Abx.   - He does not use oxygen at [facility]; we will wean his oxygen as tolerated.  - Follow-up blood cultures  - Has received 1L NS bolus followed by 125 cc/hr infusion. Will monitor volume status closely and trend lactic acid  - Continue mucolytic agents   HFrEF - Last echo on 01/10/2018: LVEF 20-25% with mild MR, RV dilation, and Pulm HTN - Currently euvolemic and BNP < 100 - Monitor volume status closely  - Continue Digoxin 0.25 mg QD, furosemide 20 mg QD, lisinopril 10 mg QD, metoprolol tartrate 12.5 mg BID - Check Digoxin level  Diabetes Mellitus  - Outpatient medications include lantus 20 units QD  - Will start Lanuts 15 units QD + SSI  - Goal CBG < 180   Schizophrenia  Bipolar Disorder  - Continue Valproic acid 500 mg BID, Quetiapine 200 mg QHS, Fluoxetine 20 mg QD, and clonazepam 0.25 mg BID  Thrombocytopenia  - Likely secondary to acute infection  - PT/INR and aPTT normal. Hgb stable. Do not suspect destruction process.   Diet: Carb mod  VTE ppx: Lovenox  CODE STATUS: Full Code  Dispo: Admit patient to Inpatient with expected length of stay greater than 2 midnights.  SignedIna Homes, MD 08/24/2019, 10:26 AM  Pager: [phone]

## 2019-08-24 NOTE — ED Notes (Signed)
Lunch Tray Ordered @ 1059.  

## 2019-08-24 NOTE — Progress Notes (Signed)
Pharmacy Antibiotic Note  Chin Wachter is a 58 y.o. male admitted on 08/24/2019 with sepsis.  Pharmacy has been consulted for cefepime and vancomycin dosing.  Presented with fever and hypoxia. History of trach with coughing and white sputum production. Scr 1.03, WBC 14.7, Temp 101.4 F, RR 25  Plan: Start cefepime 2g IV q8h  Start vancomycin 2g IV once, then 1250mg  IV q12h (estimated AUC 530) Monitor renal function, culture results, clinical picture, and levels as indicated.  Height: 6\' 1"  (185.4 cm) Weight: 264 lb 8.8 oz (120 kg) IBW/kg (Calculated) : 79.9  Temp (24hrs), Avg:101.4 F (38.6 C), Min:101.4 F (38.6 C), Max:101.4 F (38.6 C)  Recent Labs  Lab 08/24/19 0615  WBC 14.7*  CREATININE 1.03  LATICACIDVEN 2.1*    Estimated Creatinine Clearance: 107.3 mL/min (by C-G formula based on SCr of 1.03 mg/dL).    No Known Allergies  Antimicrobials this admission: vancomycin 11/23 >>  cefepime 11/23 >>  Metronidazole 11/23   Dose adjustments this admission: N/A  Microbiology results: 11/23 BCx: sent 11/23 UCx: sent   Thank you for allowing pharmacy to be a part of this patient's care.  Leonor Liv 08/24/2019 8:10 AM

## 2019-08-24 NOTE — ED Provider Notes (Signed)
[facility] EMERGENCY DEPARTMENT Provider Note   CSN: [id number] Arrival date & time: 08/24/19  8657     History   Chief Complaint Chief Complaint  Patient presents with  . Shortness of Breath    HPI Tommy Mathews is a 58 y.o. male with a hx of respiratory failure with trach, recurrent sepsis, recurrent pneumonia and UTI, diabetes, schizophrenia, bipolar disorder presents to the Emergency Department via EMS with fever and hypoxia.  Per EMS, patient had a fall from bed this morning.  He was uninjured and the facility assisted him back to bed however when they checked his vital signs they noted him to be hypoxic in the 50s.  EMS was called.  Per EMS, patient found to be hypoxic in the 70s upon their arrival and febrile to 101.  Patient given 650 mg of Tylenol prior to arrival.  He did have some coughing with thick white sputum production from his trach while in route to the hospital.  Patient is without specific complaints.  He reports he feels okay and is not more short of breath than usual.  RN at facility reported to EMS that they were concerned about possible UTI as well.  Patient denies headache chest pain, shortness of breath abdominal pain, nausea, vomiting, diarrhea, weakness, dizziness, syncope.  MS reports improvement in hypoxia to 92% with 8 L via nonrebreather.        The history is provided by the patient, the EMS personnel and medical records. No language interpreter was used.    Past Medical History:  Diagnosis Date  . Diabetes (HCC)   . GERD (gastroesophageal reflux disease)   . HLD (hyperlipidemia)   . HTN (hypertension)     Patient Active Problem List   Diagnosis Date Noted  . Acute respiratory failure (HCC)   . 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/2018  . Bipolar I disorder, most recent episode depressed (HCC) 03/03/2018  . Schizophrenia (HCC) 02/27/2018  . Pressure injury of skin 01/18/2018  . GI bleed  01/06/2018  . Acute respiratory failure with hypoxia (HCC) 01/06/2018  . Severe sepsis with septic shock (HCC) 01/06/2018  . Multifocal pneumonia 01/06/2018  . UTI (urinary tract infection) 01/06/2018  . AKI (acute kidney injury) (HCC) 01/06/2018  . Ileus (HCC) 01/06/2018  . Diabetes (HCC) 01/06/2018    Past Surgical History:  Procedure Laterality Date  . IR GASTROSTOMY TUBE REMOVAL  08/15/2018  . PEG PLACEMENT N/A 01/21/2018   Procedure: PERCUTANEOUS ENDOSCOPIC GASTROSTOMY (PEG) PLACEMENT;  Surgeon: Wyline Mood, MD;  Location: [facility] ENDOSCOPY;  Service: Gastroenterology;  Laterality: N/A;  . TRACHEOSTOMY TUBE PLACEMENT N/A 01/23/2018   Procedure: TRACHEOSTOMY;  Surgeon: Bud Face, MD;  Location: [facility] ORS;  Service: ENT;  Laterality: N/A;        Home Medications    Prior to Admission medications   Medication Sig Start Date End Date Taking? Authorizing Provider  acetylcysteine (MUCOMYST) 10% SOLN Take 4 mLs by nebulization every 12 (twelve) hours as needed (secretions). 03/19/18   Shaune Pollack, MD  bisacodyl (DULCOLAX) 10 MG suppository Place 1 suppository (10 mg total) rectally daily as needed for moderate constipation. 03/19/18   Shaune Pollack, MD  budesonide (PULMICORT) 0.5 MG/2ML nebulizer solution Take 2 mLs (0.5 mg total) by nebulization 2 (two) times daily. 03/19/18   Shaune Pollack, MD  clonazePAM (KLONOPIN) 0.5 MG tablet Take 0.5 tablets (0.25 mg total) by mouth 2 (two) times daily. 03/19/18   Shaune Pollack, MD  digoxin Margit Banda)  0.25 MG tablet Place 1 tablet (0.25 mg total) into feeding tube daily. 03/20/18   Shaune Pollack, MD  docusate (COLACE) 50 MG/5ML liquid Place 10 mLs (100 mg total) into feeding tube 2 (two) times daily as needed for mild constipation. 03/19/18   Shaune Pollack, MD  docusate (COLACE) 50 MG/5ML liquid Place 5 mLs (50 mg total) into feeding tube 2 (two) times daily. 03/19/18   Shaune Pollack, MD  FLUoxetine (PROZAC) 20 MG/5ML solution Place 5 mLs (20 mg total) into feeding tube  daily. 03/20/18   Shaune Pollack, MD  furosemide (LASIX) 20 MG tablet Place 1 tablet (20 mg total) into feeding tube daily. 03/20/18   Shaune Pollack, MD  gabapentin (NEURONTIN) 250 MG/5ML solution Place 4 mLs (200 mg total) into feeding tube every 12 (twelve) hours. 03/19/18   Shaune Pollack, MD  guaiFENesin (ROBITUSSIN) 100 MG/5ML SOLN Place 10 mLs (200 mg total) into feeding tube 4 (four) times daily. 03/19/18   Shaune Pollack, MD  insulin glargine (LANTUS) 100 UNIT/ML injection Inject 0.2 mLs (20 Units total) into the skin at bedtime. 03/19/18   Shaune Pollack, MD  ipratropium-albuterol (DUONEB) 0.5-2.5 (3) MG/3ML SOLN Take 3 mLs by nebulization every 4 (four) hours as needed. 03/19/18   Shaune Pollack, MD  ipratropium-albuterol (DUONEB) 0.5-2.5 (3) MG/3ML SOLN Take 3 mLs by nebulization every 4 (four) hours. 03/19/18   Shaune Pollack, MD  ketoconazole (NIZORAL) 2 % cream Apply topically daily as needed for irritation. 03/19/18   Shaune Pollack, MD  lisinopril (PRINIVIL,ZESTRIL) 10 MG tablet Place 1 tablet (10 mg total) into feeding tube daily. Please hold for BP < 130 mmhg 03/20/18   Shaune Pollack, MD  magnesium oxide (MAG-OX) 400 (241.3 Mg) MG tablet Place 1 tablet (400 mg total) into feeding tube 2 (two) times daily. 03/19/18   Shaune Pollack, MD  metoprolol tartrate (LOPRESSOR) 25 MG tablet Place 0.5 tablets (12.5 mg total) into feeding tube every 6 (six) hours. Please hold for BP < 130 mmhg 03/19/18   Shaune Pollack, MD  mouth rinse LIQD solution 15 mLs by Mouth Rinse route daily. 10 times per day. 03/19/18   Shaune Pollack, MD  Nutritional Supplements (FEEDING SUPPLEMENT, GLUCERNA 1.5 CAL,) LIQD Place 1,000 mLs into feeding tube continuous. 03/19/18   Shaune Pollack, MD  nystatin (MYCOSTATIN) 100000 UNIT/ML suspension Take 5 mLs (500,000 Units total) by mouth 4 (four) times daily. 03/19/18   Shaune Pollack, MD  pantoprazole sodium (PROTONIX) 40 mg/20 mL PACK Place 20 mLs (40 mg total) into feeding tube daily. 03/20/18   Shaune Pollack, MD  QUEtiapine (SEROQUEL)  200 MG tablet Place 1 tablet (200 mg total) into feeding tube at bedtime. 03/19/18   Shaune Pollack, MD  QUEtiapine (SEROQUEL) 400 MG tablet Place 1 tablet (400 mg total) into feeding tube daily. 03/20/18   Shaune Pollack, MD  scopolamine (TRANSDERM-SCOP) 1 MG/3DAYS Place 1 patch (1.5 mg total) onto the skin every 3 (three) days. 03/22/18   Shaune Pollack, MD  sodium chloride HYPERTONIC 3 % nebulizer solution Take 4 mLs by nebulization daily. 03/20/18   Shaune Pollack, MD  valproic acid (DEPAKENE) 250 MG/5ML SOLN solution Place 10 mLs (500 mg total) into feeding tube 2 (two) times daily. 03/19/18   Shaune Pollack, MD  Water For Irrigation, Sterile (FREE WATER) SOLN Place 200 mLs into feeding tube every 8 (eight) hours. 03/19/18   Shaune Pollack, MD    Family History No family history on file.  Social History Social History   Tobacco Use  .  Smoking status: Current Every Day Smoker    Packs/day: 1.00    Years: 40.00    Pack years: 40.00    Types: Cigarettes    Start date: 2000  . Smokeless tobacco: Never Used  Substance Use Topics  . Alcohol use: Never    Frequency: Never  . Drug use: Never     Allergies   Patient has no known allergies.   Review of Systems Review of Systems  Constitutional: Positive for fever. Negative for appetite change, diaphoresis, fatigue and unexpected weight change.  HENT: Negative for mouth sores.   Eyes: Negative for visual disturbance.  Respiratory: Positive for cough. Negative for chest tightness, shortness of breath and wheezing.   Cardiovascular: Negative for chest pain.  Gastrointestinal: Negative for abdominal pain, constipation, diarrhea, nausea and vomiting.  Endocrine: Negative for polydipsia, polyphagia and polyuria.  Genitourinary: Negative for dysuria, frequency, hematuria and urgency.  Musculoskeletal: Negative for back pain and neck stiffness.  Skin: Negative for rash.  Allergic/Immunologic: Negative for immunocompromised state.  Neurological: Negative for  syncope, light-headedness and headaches.  Hematological: Does not bruise/bleed easily.  Psychiatric/Behavioral: Negative for sleep disturbance. The patient is not nervous/anxious.      Physical Exam Updated Vital Signs BP 134/81   Pulse 100   Temp (!) 101.4 F (38.6 C)   Resp (!) 23   SpO2 98%   Physical Exam Vitals signs and nursing note reviewed.  Constitutional:      General: He is not in acute distress.    Appearance: He is not diaphoretic.     Comments: Chronically ill-appearing  HENT:     Head: Normocephalic.  Eyes:     General: No scleral icterus.    Conjunctiva/sclera: Conjunctivae normal.  Neck:     Musculoskeletal: Normal range of motion.     Comments: Trach in place Cardiovascular:     Rate and Rhythm: Regular rhythm. Tachycardia present.     Pulses: Normal pulses.          Radial pulses are 2+ on the right side and 2+ on the left side.  Pulmonary:     Effort: Tachypnea present. No accessory muscle usage, prolonged expiration, respiratory distress or retractions.     Breath sounds: No stridor. Rhonchi ( throughout) present.     Comments: Equal chest rise. Mild increased work of breathing. Abdominal:     General: There is no distension.     Palpations: Abdomen is soft.     Tenderness: There is no abdominal tenderness. There is no guarding or rebound.  Musculoskeletal:     Comments: Moves all extremities equally and without difficulty.  Skin:    General: Skin is warm and dry.     Capillary Refill: Capillary refill takes less than 2 seconds.     Comments: Hot to touch  Neurological:     Mental Status: He is alert.     GCS: GCS eye subscore is 4. GCS verbal subscore is 5. GCS motor subscore is 6.     Comments: Speech is clear and goal oriented.  Psychiatric:        Mood and Affect: Mood normal.      ED Treatments / Results  Labs (all labs ordered are listed, but only abnormal results are displayed) Labs Reviewed  POCT I-STAT 7, (LYTES, BLD GAS,  ICA,H+H) - Abnormal; Notable for the following components:      Result Value   pO2, Arterial 67.0 (*)    All other components within normal limits  CULTURE, BLOOD (ROUTINE X 2)  CULTURE, BLOOD (ROUTINE X 2)  URINE CULTURE  LACTIC ACID, PLASMA  LACTIC ACID, PLASMA  COMPREHENSIVE METABOLIC PANEL  CBC WITH DIFFERENTIAL/PLATELET  APTT  PROTIME-INR  URINALYSIS, ROUTINE W REFLEX MICROSCOPIC  BRAIN NATRIURETIC PEPTIDE  BLOOD GAS, ARTERIAL  POC SARS CORONAVIRUS 2 AG -  ED    EKG EKG Interpretation  Date/Time:  Monday August 24 2019 06:35:05 EST Ventricular Rate:  102 PR Interval:    QRS Duration: 96 QT Interval:  319 QTC Calculation: 416 R Axis:   36 Text Interpretation: Sinus tachycardia RSR' in V1 or V2, right VCD or RVH No significant change since last tracing other than rate is faster Confirmed by Rochele RaringWard, Kristen [phone] on 08/24/2019 6:42:13 AM   Radiology No results found.  Procedures .Critical Care Performed by: Dierdre ForthMuthersbaugh, Pieter Fooks, PA-C Authorized by: Dierdre ForthMuthersbaugh, Gurney Balthazor, PA-C   Critical care provider statement:    Critical care time (minutes):  45   Critical care time was exclusive of:  Separately billable procedures and treating other patients   Critical care was necessary to treat or prevent imminent or life-threatening deterioration of the following conditions:  Respiratory failure   Critical care was time spent personally by me on the following activities:  Discussions with consultants, evaluation of patient's response to treatment, examination of patient, ordering and performing treatments and interventions, ordering and review of laboratory studies, ordering and review of radiographic studies, pulse oximetry, re-evaluation of patient's condition, obtaining history from patient or surrogate and review of old charts   I assumed direction of critical care for this patient from another provider in my specialty: no     (including critical care time)  Medications  Ordered in ED Medications  ceFEPIme (MAXIPIME) 2 g in sodium chloride 0.9 % 100 mL IVPB (2 g Intravenous New Bag/Given 08/24/19 0630)  metroNIDAZOLE (FLAGYL) IVPB 500 mg (has no administration in time range)  sodium chloride 0.9 % bolus 1,000 mL (1,000 mLs Intravenous New Bag/Given 08/24/19 0630)    Followed by  0.9 %  sodium chloride infusion (has no administration in time range)  vancomycin (VANCOCIN) 2,000 mg in sodium chloride 0.9 % 500 mL IVPB (has no administration in time range)     Initial Impression / Assessment and Plan / ED Course  I have reviewed the triage vital signs and the nursing notes.  Pertinent labs & imaging results that were available during my care of the patient were reviewed by me and considered in my medical decision making (see chart for details).  Clinical Course as of Aug 23 650  [facility]  [id number] Pt with SPO2 92% on 60% via trach collar.    [HM]  0641 Hazy opacities.  DG Chest Port 1 View [HM]  [phone] Febrile here.  Patient received 650 mg of Tylenol around 530.  Will monitor.  Temp(!): 101.4 F (38.6 C) [HM]    Clinical Course User Index [HM] Issabelle Mcraney, Boyd KerbsHannah, PA-C        Tommy Mathews was evaluated in Emergency Department on 08/24/2019 for the symptoms described in the history of present illness. He was evaluated in the context of the global COVID-19 pandemic, which necessitated consideration that the patient might be at risk for infection with the SARS-CoV-2 virus that causes COVID-19. Institutional protocols and algorithms that pertain to the evaluation of patients at risk for COVID-19 are in a state of rapid change based on information released by regulatory bodies including the CDC and federal and state  organizations. These policies and algorithms were followed during the patient's care in the ED.   Patient presents with sepsis to the emergency department.  Tachycardic, hypoxic and febrile.  Patient has a history of recurrent pneumonia  and UTIs.  Concern for possible Covid.  Sepsis work-up initiated.  Patient is not hypotensive.  Fluids given but as patient is currently undifferentiated pneumonia versus Covid will not give 30 mL/kg.  Patient will need admission.  6:50 AM Patient maintaining oxygen saturations with trach collar.  Will hold on vent placement at this time.  At shift change care was transferred to [facility], PA-C who will follow pending studies, re-evaulate and determine disposition.    The patient was discussed with and seen by Dr. Elesa Massed who agrees with the treatment plan.   Final Clinical Impressions(s) / ED Diagnoses   Final diagnoses:  Sepsis with acute hypoxic respiratory failure without septic shock, due to unspecified organism [facility])    ED Discharge Orders    None       Milta Deiters 08/24/19 8295    Ward, Layla Maw, DO 08/24/19 [phone]

## 2019-08-24 NOTE — ED Triage Notes (Signed)
Pt. Arrived via [facility]. EMS stated that Pt. [place] out of [facility]  [facility] Vitals upon checking Pt. Ran in 50's-70's -O2. [facility] RN gave 2 1/2 albuterol. Pt. also has trach and also had a coughing fit. [facility] RN suctioned Pt. Twice. Pt had temp of 101.1. [facility] RN gave 650 tylenol. EMS advised vitals 140/80, HR 110, 92% on 8L O2.

## 2019-08-25 ENCOUNTER — Inpatient Hospital Stay (HOSPITAL_COMMUNITY): Payer: Medicaid Other

## 2019-08-25 DIAGNOSIS — A419 Sepsis, unspecified organism: Secondary | ICD-10-CM | POA: Diagnosis present

## 2019-08-25 LAB — BASIC METABOLIC PANEL
Anion gap: 9 (ref 5–15)
BUN: 10 mg/dL (ref 6–20)
CO2: 26 mmol/L (ref 22–32)
Calcium: 8.4 mg/dL — ABNORMAL LOW (ref 8.9–10.3)
Chloride: 104 mmol/L (ref 98–111)
Creatinine, Ser: 1.09 mg/dL (ref 0.61–1.24)
GFR calc Af Amer: >60 mL/min (ref >60)
GFR calc non Af Amer: >60 mL/min (ref >60)
Glucose, Bld: 104 mg/dL — ABNORMAL HIGH (ref 70–99)
Potassium: 4.1 mmol/L (ref 3.5–5.1)
Sodium: 139 mmol/L (ref 135–145)

## 2019-08-25 LAB — GLUCOSE, CAPILLARY
Glucose-Capillary: 93 mg/dL (ref 70–99)
Glucose-Capillary: 183 mg/dL — ABNORMAL HIGH (ref 70–99)
Glucose-Capillary: 208 mg/dL — ABNORMAL HIGH (ref 70–99)
Glucose-Capillary: 156 mg/dL — ABNORMAL HIGH (ref 70–99)

## 2019-08-25 LAB — CBC
HCT: 43.7 % (ref 39.0–52.0)
Hemoglobin: 13.4 g/dL (ref 13.0–17.0)
MCH: 26.7 pg (ref 26.0–34.0)
MCHC: 30.7 g/dL (ref 30.0–36.0)
MCV: 87.2 fL (ref 80.0–100.0)
Platelets: 131 10*3/uL — ABNORMAL LOW (ref 150–400)
RBC: 5.01 MIL/uL (ref 4.22–5.81)
RDW: 16.4 % — ABNORMAL HIGH (ref 11.5–15.5)
WBC: 11.7 10*3/uL — ABNORMAL HIGH (ref 4.0–10.5)
nRBC: 0 % (ref 0.0–0.2)

## 2019-08-25 LAB — MRSA PCR SCREENING: MRSA by PCR: NEGATIVE

## 2019-08-25 LAB — URINE CULTURE: Culture: 100 — AB

## 2019-08-25 MED ORDER — IPRATROPIUM-ALBUTEROL 0.5-2.5 (3) MG/3ML IN SOLN
3.0000 mL | Freq: Two times a day (BID) | RESPIRATORY_TRACT | Status: DC
  Administered 2019-08-25 – 2019-09-01 (×15): 3 mL via RESPIRATORY_TRACT
  Filled 2019-08-25 (×17): qty 3

## 2019-08-25 MED ORDER — IOHEXOL 350 MG/ML SOLN
100.0000 mL | Freq: Once | INTRAVENOUS | Status: AC | PRN
  Administered 2019-08-25: 100 mL via INTRAVENOUS

## 2019-08-25 MED ORDER — ENOXAPARIN SODIUM 60 MG/0.6ML ~~LOC~~ SOLN
50.0000 mg | SUBCUTANEOUS | Status: DC
  Administered 2019-08-26 – 2019-09-01 (×7): 50 mg via SUBCUTANEOUS
  Filled 2019-08-25 (×7): qty 0.6

## 2019-08-25 MED ORDER — IPRATROPIUM-ALBUTEROL 0.5-2.5 (3) MG/3ML IN SOLN: 3.0000 mL | Freq: Four times a day (QID) | RESPIRATORY_TRACT | Status: DC

## 2019-08-25 MED ORDER — METHYLPREDNISOLONE SODIUM SUCC 40 MG IJ SOLR
40.0000 mg | INTRAMUSCULAR | Status: AC
  Administered 2019-08-25 – 2019-08-29 (×5): 40 mg via INTRAVENOUS
  Filled 2019-08-25 (×5): qty 1

## 2019-08-25 NOTE — Progress Notes (Signed)
Subjective: Pt seen at the bedside this morning with RN and RT after replacing trach collar. On 10L with 60% FiO2, with O2 saturations around 87-90%. Per RT, pt having a lot of secretions. Pt asking about when he is going home. He denies chest pain. Explained that we are concerned about his breathing and he needs further work-up and monitoring. Pt expressed understanding.  Objective:  Vital signs in last 24 hours: Vitals:   08/24/19 2319 08/25/19 0000 08/25/19 0341 08/25/19 0400  BP:  107/73  112/73  Pulse: 86 75 70 68  Resp: 20 18 20 18   Temp:  98.6 F (37 C)  98.5 F (36.9 C)  TempSrc:  Oral  Oral  SpO2: 96% 100% 93% 96%  Weight:      Height:       Physical Exam Vitals signs and nursing note reviewed.  Constitutional:      Appearance: He is ill-appearing.  Cardiovascular:     Rate and Rhythm: Normal rate and regular rhythm.     Heart sounds: Normal heart sounds.  Pulmonary:     Effort: Tachypnea and respiratory distress present.     Breath sounds: Decreased air movement present.     Comments: Trach collar in place, on 10L with FiO2 60%. Diffuse wheezing and rhonchi throughout Musculoskeletal:     Right lower leg: No edema.     Left lower leg: No edema.     Comments: Bilateral lower extremities symmetric and without edema, tenderness to palpation, or overlaying skin changes.  Skin:    General: Skin is warm and dry.  Neurological:     Mental Status: He is alert.    Assessment/Plan:  Principal Problem:   Acute respiratory failure with hypoxia (HCC) Active Problems:   Diabetes (HCC)   Schizophrenia (HCC)   Bipolar I disorder, most recent episode depressed ([place])   Chronic HFrEF (heart failure with reduced ejection fraction) ([place])   Thrombocytopenia [facility])   Mr. Willette is a 58 year old M who lives at [facility] with chronic respiratory failure s/p tracheostomy in 12/2017 after a prolonged admission for acute hypoxic respiratory failure in the setting of sepsis,  HFrEF, diabetes mellitus, schizophrenia, and bipolar who presented with acute hypoxic respiratory failure. He was subsequently admitted for further evaluation and management.    Acute Hypoxic Respiratory Failure  Chronic respiratory failure s/p tracheostomy in 12/2017 Differential includes bacterial pneumonia vs. COPD vs. PE. Presented with tachycardia, tachypneia, and temperature of 101.11F with a leukocytosis and diffuse bilateral infiltrates on CXR initially concerning for pneumonia. COVID negative x 2 with negative RVP panel. On examination today, pt had decreased air movement throughout with diffuse wheezing consistent with a ?COPD exacerbation. Pt has history of 40 pack year smoking history though no asthma/COPD per chart review. He is without exam finds of DVT. Well's PE score 0 and Geneva score low-risk at 3, though could be falsely low due to HR measurement while pt is on a beta blocker. Remains hypoxic on 10L with FiO2 60%. - ordered ABG and respiratory aspirate culture - could consider CTA if pt remains hypoxic/does not respond to treatments - trial scheduled duonebs BID and solumedrol 40mg  IV daily - will continue vancomycin and cefepime due to pt's risk for MDR organism - if MRSA swab negative, will discontinue vancomycin  - follow-up blood cultures  - continue mucolytics - pt does not use oxygen at home, will wean oxygen as tolerated.    Thrombocytopenia  Plts 144 on presentation, with  normal baseline around 200. PT/INR and aPTT normal. Hgb remains stable. He is not on heparin therapy and pattern does not appear consumptive based on lab work. Question if secondary to acute infectious process. - peripheral blood smear ordered - will continue to monitor  HFrEF Echo on 01/10/2018 with LVEF 20-25% with mild MR, RV dilation, and pulm HTN. BNP on admission < 100. Appears euvolemic today. - continue home Digoxin 0.25mg  QD, furosemide 20mg  QD, lisinopril 10mg  QD, metoprolol tartrate  12.5mg  BID - Digoxin level low at 0.6  Diabetes Mellitus  A1c 7.7 on admission. Previously taking Lantus 20u daily - continue Lantus 15 units daily + SSI   Schizophrenia  Bipolar Disorder  - continue home medications valproic acid 500mg  BID, quetiapine 200mg  daily, fluoxetine 20mg  daily, and clonazepam 0.25mg  BID   Diet: carb modified Fluids: none VTE ppx: enoxaparin  CODE STATUS: FULL , MD 08/25/2019, 6:37 AM Pager: [phone]

## 2019-08-25 NOTE — Progress Notes (Signed)
RT attempted to assess pt and trach. Pt not in room at this time. RT will check back later.

## 2019-08-25 NOTE — Progress Notes (Signed)
Dr. Philipp Ovens made aware that pt insistent he is not going to be stuck for ABG. MD aware RT able to wean FiO2 and is currently tolerating.  Per MD okay to d/c ABG stick. RT will continue to monitor.

## 2019-08-26 DIAGNOSIS — J441 Chronic obstructive pulmonary disease with (acute) exacerbation: Secondary | ICD-10-CM | POA: Diagnosis present

## 2019-08-26 DIAGNOSIS — J449 Chronic obstructive pulmonary disease, unspecified: Secondary | ICD-10-CM | POA: Diagnosis present

## 2019-08-26 DIAGNOSIS — J189 Pneumonia, unspecified organism: Secondary | ICD-10-CM

## 2019-08-26 LAB — COMPREHENSIVE METABOLIC PANEL
ALT: 15 U/L (ref 0–44)
AST: 38 U/L (ref 15–41)
Albumin: 2.6 g/dL — ABNORMAL LOW (ref 3.5–5.0)
Alkaline Phosphatase: 46 U/L (ref 38–126)
Anion gap: 9 (ref 5–15)
BUN: 13 mg/dL (ref 6–20)
CO2: 27 mmol/L (ref 22–32)
Calcium: 8.5 mg/dL — ABNORMAL LOW (ref 8.9–10.3)
Chloride: 101 mmol/L (ref 98–111)
Creatinine, Ser: 0.97 mg/dL (ref 0.61–1.24)
GFR calc Af Amer: >60 mL/min (ref >60)
GFR calc non Af Amer: >60 mL/min (ref >60)
Glucose, Bld: 141 mg/dL — ABNORMAL HIGH (ref 70–99)
Potassium: 4 mmol/L (ref 3.5–5.1)
Sodium: 137 mmol/L (ref 135–145)
Total Bilirubin: 0.7 mg/dL (ref 0.3–1.2)
Total Protein: 6.1 g/dL — ABNORMAL LOW (ref 6.5–8.1)

## 2019-08-26 LAB — CBC
HCT: 44.8 % (ref 39.0–52.0)
Hemoglobin: 14 g/dL (ref 13.0–17.0)
MCH: 26.9 pg (ref 26.0–34.0)
MCHC: 31.3 g/dL (ref 30.0–36.0)
MCV: 86 fL (ref 80.0–100.0)
Platelets: 178 10*3/uL (ref 150–400)
RBC: 5.21 MIL/uL (ref 4.22–5.81)
RDW: 16.1 % — ABNORMAL HIGH (ref 11.5–15.5)
WBC: 13 10*3/uL — ABNORMAL HIGH (ref 4.0–10.5)
nRBC: 0 % (ref 0.0–0.2)

## 2019-08-26 LAB — GLUCOSE, CAPILLARY
Glucose-Capillary: 146 mg/dL — ABNORMAL HIGH (ref 70–99)
Glucose-Capillary: 207 mg/dL — ABNORMAL HIGH (ref 70–99)
Glucose-Capillary: 144 mg/dL — ABNORMAL HIGH (ref 70–99)
Glucose-Capillary: 177 mg/dL — ABNORMAL HIGH (ref 70–99)

## 2019-08-26 MED ORDER — DIVALPROEX SODIUM 250 MG PO DR TAB
500.0000 mg | DELAYED_RELEASE_TABLET | Freq: Two times a day (BID) | ORAL | Status: DC
  Administered 2019-08-26 – 2019-09-01 (×12): 500 mg via ORAL
  Filled 2019-08-26 (×12): qty 2

## 2019-08-26 NOTE — NC FL2 (Signed)
[place] MEDICAID FL2 LEVEL OF CARE SCREENING TOOL     IDENTIFICATION  Patient Name: Tommy Mathews Birthdate: 06-Jul-1961 Sex: male Admission Date (Current Location): 08/24/2019  [facility] and [place] Number:  Producer, television/film/video and Address:  [facility] [place], [address] [place], [place] [address]      Provider Number: [id number]  Attending Physician Name and Address:  Reymundo Poll, MD  Relative Name and Phone Number:  Orland Dec ([phone]    Current Level of Care: Hospital Recommended Level of Care: Skilled Nursing Facility Prior Approval Number:    Date Approved/Denied:   PASRR Number: [id number]  Discharge Plan: SNF    Current Diagnoses: Patient Active Problem List   Diagnosis Date Noted  . COPD exacerbation (HCC)   . Sepsis with acute hypoxic respiratory failure without septic shock (HCC)   . Chronic HFrEF (heart failure with reduced ejection fraction) (HCC) 08/24/2019  . Thrombocytopenia (HCC) 08/24/2019  . Acute respiratory failure (HCC)   . 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/2018  . Bipolar I disorder, most recent episode depressed (HCC) 03/03/2018  . Schizophrenia (HCC) 02/27/2018  . Pressure injury of skin 01/18/2018  . GI bleed 01/06/2018  . Acute respiratory failure with hypoxia (HCC) 01/06/2018  . Severe sepsis with septic shock (HCC) 01/06/2018  . Pneumonia of both lungs due to infectious organism 01/06/2018  . UTI (urinary tract infection) 01/06/2018  . AKI (acute kidney injury) (HCC) 01/06/2018  . Ileus (HCC) 01/06/2018  . Diabetes (HCC) 01/06/2018    Orientation RESPIRATION BLADDER Height & Weight     Self, Time, Situation, Place  Tracheostomy, O2 Incontinent Weight: 103.9 kg Height:  6\' 1"  (185.4 cm)  BEHAVIORAL SYMPTOMS/MOOD NEUROLOGICAL BOWEL NUTRITION STATUS      Continent Diet(refer  to d/c summary)  AMBULATORY STATUS COMMUNICATION OF NEEDS Skin    Extensive Assist Verbally(pt with a trach) Normal                       Personal Care Assistance Level of Assistance  Bathing, Feeding, Dressing Bathing Assistance: Maximum assistance Feeding assistance: Maximum assistance Dressing Assistance: Maximum assistance     Functional Limitations Info  Sight, Hearing, Speech Sight Info: Adequate Hearing Info: Adequate Speech Info: (pt with trach)    SPECIAL CARE FACTORS FREQUENCY  OT (By licensed OT), PT (By licensed PT)     PT Frequency: 5x / week OT Frequency: 5x / week            Contractures Contractures Info: Not present    Additional Factors Info  Code Status, Allergies, Psychotropic Code Status Info: Full code Allergies Info: : No Known Allergies Psychotropic Info: seroquel, klonopin         Current Medications (08/26/2019):  This is the current hospital active medication list Current Facility-Administered Medications  Medication Dose Route Frequency Provider Last Rate Last Dose  . acetaminophen (TYLENOL) tablet 650 mg  650 mg Oral Q6H PRN 08/28/2019, MD       Or  . acetaminophen (TYLENOL) suppository 650 mg  650 mg Rectal Q6H PRN Helberg, Levora Dredge, MD      . acetylcysteine (MUCOMYST) 10 % nebulizer solution 4 mL  4 mL Nebulization Q12H PRN Jill Alexanders, MD      . bisacodyl (DULCOLAX) suppository 10 mg  10 mg Rectal Daily PRN Helberg, Reymundo Poll, MD      . budesonide (PULMICORT) nebulizer solution 0.5 mg  0.5 mg Nebulization BID Helberg,  Larkin Ina, MD   0.5 mg at 08/26/19 0730  . ceFEPIme (MAXIPIME) 2 g in sodium chloride 0.9 % 100 mL IVPB  2 g Intravenous Q8H Helberg, Justin, MD 200 mL/hr at 08/26/19 0659 2 g at 08/26/19 0659  . clonazePAM (KLONOPIN) disintegrating tablet 0.25 mg  0.25 mg Oral BID Velna Ochs, MD   0.25 mg at 08/26/19 0944  . digoxin (LANOXIN) tablet 0.25 mg  0.25 mg Oral Daily Velna Ochs, MD   0.25 mg at 08/26/19 0944  . divalproex (DEPAKOTE) DR tablet 500 mg  500 mg Oral Q12H  Hammons, Theone Murdoch, RPH      . docusate sodium (COLACE) capsule 100 mg  100 mg Oral BID PRN Velna Ochs, MD      . enoxaparin (LOVENOX) injection 50 mg  50 mg Subcutaneous Q24H Skeet Simmer, RPH   50 mg at 08/26/19 0948  . FLUoxetine (PROZAC) capsule 20 mg  20 mg Oral Daily Agyei, Obed K, MD   20 mg at 08/26/19 0944  . furosemide (LASIX) tablet 20 mg  20 mg Oral Daily Velna Ochs, MD   20 mg at 08/26/19 0944  . gabapentin (NEURONTIN) capsule 200 mg  200 mg Oral BID Jean Rosenthal, MD   200 mg at 08/26/19 0944  . guaiFENesin tablet 200 mg  200 mg Oral Q6H Velna Ochs, MD   200 mg at 08/26/19 1254  . insulin aspart (novoLOG) injection 0-15 Units  0-15 Units Subcutaneous TID WC Ina Homes, MD   5 Units at 08/26/19 1253  . insulin aspart (novoLOG) injection 0-5 Units  0-5 Units Subcutaneous QHS Helberg, Justin, MD      . insulin glargine (LANTUS) injection 15 Units  15 Units Subcutaneous QHS Ina Homes, MD   15 Units at 08/25/19 2233  . ipratropium-albuterol (DUONEB) 0.5-2.5 (3) MG/3ML nebulizer solution 3 mL  3 mL Nebulization BID Velna Ochs, MD   3 mL at 08/26/19 0730  . lisinopril (ZESTRIL) tablet 10 mg  10 mg Oral Daily Velna Ochs, MD   10 mg at 08/26/19 0944  . magnesium oxide (MAG-OX) tablet 400 mg  400 mg Oral BID Velna Ochs, MD   400 mg at 08/26/19 0944  . methylPREDNISolone sodium succinate (SOLU-MEDROL) 40 mg/mL injection 40 mg  40 mg Intravenous Q24H Agyei, Obed K, MD   40 mg at 08/26/19 0945  . metoprolol tartrate (LOPRESSOR) tablet 12.5 mg  12.5 mg Oral Q6H Velna Ochs, MD   12.5 mg at 08/26/19 1254  . ondansetron (ZOFRAN) tablet 4 mg  4 mg Oral Q6H PRN Ina Homes, MD       Or  . ondansetron (ZOFRAN) injection 4 mg  4 mg Intravenous Q6H PRN Helberg, Justin, MD      . pantoprazole (PROTONIX) EC tablet 40 mg  40 mg Oral Daily Velna Ochs, MD   40 mg at 08/26/19 0944  . QUEtiapine (SEROQUEL) tablet 200 mg  200 mg Oral QHS  Velna Ochs, MD   200 mg at 08/25/19 2231     Discharge Medications: Please see discharge summary for a list of discharge medications.  Relevant Imaging Results:  Relevant Lab Results:   Additional Information SSN: [id number]  Sharin Mons, RN

## 2019-08-26 NOTE — TOC Initial Note (Signed)
Transition of Care [facility]) - Initial/Assessment Note    Patient Details  Name: Tommy Mathews MRN: [id number] Date of Birth: [birth year]  Transition of [facility]) CM/SW Contact:    Sharin Mons, RN Phone Number: 08/26/2019, 1:37 PM  Clinical Narrative:    Presents with Acute respiratory failure, sepsis 2/2 PNA and COPD  exacerbation. Hx of  chronic respiratory failure s/p tracheostomy in 12/2017 ,CHF,diabetes mellitus, schizophrenia, and bipolar.  From [facility] ( LTC).        Discharge plan: return to  [facility] when medically ready. NCM  confirmed with pt/ pt's mom. Pt has been @ [facility] since June 2019 per mom. Iona Beard (Mother)      [phone]      [facility] team following for disposition needs ....  Expected Discharge Plan: Skilled Nursing Facility(LTC) Barriers to Discharge: Continued Medical Work up   Patient Goals and CMS Choice        Expected Discharge Plan and Services Expected Discharge Plan: Skilled Nursing Facility(LTC)   Discharge Planning Services: CM Consult                                          Prior Living Arrangements/Services                       Activities of Daily Living      Permission Sought/Granted                  Emotional Assessment              Admission diagnosis:  Pleural effusion [J90] Sepsis with acute hypoxic respiratory failure without septic shock, due to unspecified organism ([place]) [A41.9, R65.20, J96.01] Acute respiratory failure with hypoxia ([place]) [J96.01] Patient Active Problem List   Diagnosis Date Noted  . COPD exacerbation ([place])   . Sepsis with acute hypoxic respiratory failure without septic shock ([place])   . Chronic HFrEF (heart failure with reduced ejection fraction) ([place]) 08/24/2019  . Thrombocytopenia ([place]) 08/24/2019  . Acute respiratory failure ([place])   . Severe recurrent major depression without psychotic features ([place]) 03/03/2018  . Bipolar I disorder,  most recent episode depressed ([place]) 03/03/2018  . Schizophrenia ([place]) 02/27/2018  . Pressure injury of skin 01/18/2018  . GI bleed 01/06/2018  . Acute respiratory failure with hypoxia ([place]) 01/06/2018  . Severe sepsis with septic shock ([place]) 01/06/2018  . Pneumonia of both lungs due to infectious organism 01/06/2018  . UTI (urinary tract infection) 01/06/2018  . AKI (acute kidney injury) ([place]) 01/06/2018  . Ileus ([place]) 01/06/2018  . Diabetes ([place]) 01/06/2018   PCP:  Patient, [name] Pharmacy:   [facility], [address] [place] [address] Phone: [phone] Fax: [phone]     Social Determinants of Health (SDOH) Interventions    Readmission Risk Interventions No flowsheet data found.

## 2019-08-26 NOTE — Progress Notes (Signed)
Subjective: Pt seen at the bedside this morning. Appears more comfortable today. Pt agrees that his breathing is improved. O2 saturation 100 on 8L with FiO2 of 35%. No acute concerns at this time.  Objective:  Vital signs in last 24 hours: Vitals:   08/25/19 2320 08/26/19 0020 08/26/19 0329 08/26/19 0405  BP:  125/73  118/73  Pulse: 75 70 70 63  Resp: 20 (!) 22 (!) 22 (!) 23  Temp:  98.2 F (36.8 C)  98.4 F (36.9 C)  TempSrc:  Oral  Oral  SpO2: 95% 94% 100% 96%  Weight:      Height:       Physical Exam Vitals signs and nursing note reviewed.  Constitutional:      General: He is not in acute distress. Cardiovascular:     Rate and Rhythm: Normal rate and regular rhythm.  Pulmonary:     Effort: Pulmonary effort is normal. No tachypnea or respiratory distress.     Comments: Diffuse rhonchi and end-expiratory wheezing. Air movement much improved from yesterday. Skin:    General: Skin is warm and dry.  Neurological:     Mental Status: He is alert.    Assessment/Plan:  Principal Problem:   Acute respiratory failure with hypoxia (HCC) Active Problems:   Diabetes (HCC)   Schizophrenia (HCC)   Bipolar I disorder, most recent episode depressed (HCC)   Chronic HFrEF (heart failure with reduced ejection fraction) (HCC)   Thrombocytopenia (HCC)   Sepsis with acute hypoxic respiratory failure without septic shock [facility])   Tommy Mathews is a 58 year old M who lives at [facility] with chronic respiratory failure s/p tracheostomy in 12/2017 after a prolonged admission for acute hypoxic respiratory failure in the setting of sepsis,HFrEF,diabetes mellitus, schizophrenia, and bipolar who presented withacute hypoxic respiratory failure. He was subsequently admitted for further evaluation and management.    Acute Hypoxic Respiratory Failure  Chronic respiratory failure s/p tracheostomy in 12/2017 Treating as bacterial pneumonia superimposed on COPD exacerbation. Presented with  tachycardia, tachypneia, and temperature of 101.59F with a leukocytosis and diffuse bilateral infiltrates on CXR initially concerning for pneumonia. COVID negative x 2 with negative RVP panel. Pt also had decreased air movement throughout with diffuse wheezing consistent with a ?COPD exacerbation.  - respiratory aspirate culture with moderate gram + rods, and rate gram+ cocci/gram variable rods - CTA significant for no PE, bibasilar consolidations with small bilateral pleural effusions, and PAH. - blood cultures with no growth in 1 day  - continue scheduled duonebs BID and solumedrol 40mg  IV daily - continue cefepime due to pt's risk for MDR organism - continue mucolytics - pt does not use oxygen at home, will wean oxygen as tolerated.    Thrombocytopenia - resolved Plts 144 on presentation, with normal baseline around 200. PT/INR and aPTT normal. Hgb remains stable. - plts resolved to 178 this morning - follow-up peripheral blood smear  - continue to monitor   HFrEF Echo on 01/10/2018 with LVEF 20-25% with mild MR, RV dilation, and pulm HTN.  - net +528cc this admission, though CT chest with generalized volume overload - continue home digoxin 0.25mg  QD, furosemide 20mg  QD, lisinopril 10mg  QD, metoprolol tartrate 12.5mg  BID - monitor volume status closely  Diabetes Mellitus  A1c 7.7 on admission. Previously taking Lantus 20u daily - continue Lantus 15 units daily + SSI   Schizophrenia  Bipolar Disorder  - continue home medications valproic acid 500mg  BID, quetiapine 200mg  daily, fluoxetine 20mg  daily, and clonazepam 0.25mg   BID   Diet:carb modified Fluids: none VTE MIT:[id number]  CODE STATUS:FULL CODE   Ladona Horns, MD 08/26/2019, 6:40 AM Pager: [phone]

## 2019-08-27 LAB — COMPREHENSIVE METABOLIC PANEL
ALT: 14 U/L (ref 0–44)
AST: 25 U/L (ref 15–41)
Albumin: 2.4 g/dL — ABNORMAL LOW (ref 3.5–5.0)
Alkaline Phosphatase: 47 U/L (ref 38–126)
Anion gap: 7 (ref 5–15)
BUN: 11 mg/dL (ref 6–20)
CO2: 29 mmol/L (ref 22–32)
Calcium: 8.7 mg/dL — ABNORMAL LOW (ref 8.9–10.3)
Chloride: 104 mmol/L (ref 98–111)
Creatinine, Ser: 0.94 mg/dL (ref 0.61–1.24)
GFR calc Af Amer: >60 mL/min (ref >60)
GFR calc non Af Amer: >60 mL/min (ref >60)
Glucose, Bld: 120 mg/dL — ABNORMAL HIGH (ref 70–99)
Potassium: 3.8 mmol/L (ref 3.5–5.1)
Sodium: 140 mmol/L (ref 135–145)
Total Bilirubin: 0.7 mg/dL (ref 0.3–1.2)
Total Protein: 6 g/dL — ABNORMAL LOW (ref 6.5–8.1)

## 2019-08-27 LAB — GLUCOSE, CAPILLARY
Glucose-Capillary: 135 mg/dL — ABNORMAL HIGH (ref 70–99)
Glucose-Capillary: 256 mg/dL — ABNORMAL HIGH (ref 70–99)
Glucose-Capillary: 155 mg/dL — ABNORMAL HIGH (ref 70–99)
Glucose-Capillary: 165 mg/dL — ABNORMAL HIGH (ref 70–99)
Glucose-Capillary: 176 mg/dL — ABNORMAL HIGH (ref 70–99)

## 2019-08-27 LAB — CBC
HCT: 45.4 % (ref 39.0–52.0)
Hemoglobin: 13.9 g/dL (ref 13.0–17.0)
MCH: 26.7 pg (ref 26.0–34.0)
MCHC: 30.6 g/dL (ref 30.0–36.0)
MCV: 87.1 fL (ref 80.0–100.0)
Platelets: 200 10*3/uL (ref 150–400)
RBC: 5.21 MIL/uL (ref 4.22–5.81)
RDW: 16.1 % — ABNORMAL HIGH (ref 11.5–15.5)
WBC: 11.8 10*3/uL — ABNORMAL HIGH (ref 4.0–10.5)
nRBC: 0 % (ref 0.0–0.2)

## 2019-08-27 MED ORDER — FUROSEMIDE 10 MG/ML IJ SOLN
40.0000 mg | Freq: Once | INTRAMUSCULAR | Status: AC
  Administered 2019-08-27: 40 mg via INTRAVENOUS
  Filled 2019-08-27: qty 4

## 2019-08-27 NOTE — Progress Notes (Signed)
Midnight and 0600 dose of metoprolol held due to HR 48-55.

## 2019-08-27 NOTE — Progress Notes (Signed)
Pharmacy Antibiotic Note  Tommy Mathews is a 58 y.o. male admitted on 08/24/2019 with sepsis.  Pharmacy has been consulted for cefepime dosing.  Presented with fever and hypoxia. History of trach with coughing and white sputum production. Scr 0.94, leukocytosis improving 13>11.8, afebrile, FiO2 60>35%. D#4 of antibiotic therapy.   Plan: - Continue cefepime 2g IV q8hr - Monitor renal function, cx/s, and clinical improvement - F/u length of therapy   Height: 6\' 1"  (185.4 cm) Weight: 229 lb 0.9 oz (103.9 kg) IBW/kg (Calculated) : 79.9  Temp (24hrs), Avg:98.4 F (36.9 C), Min:98.1 F (36.7 C), Max:98.8 F (37.1 C)  Recent Labs  Lab 08/24/19 0615 08/24/19 0806 08/25/19 0308 08/26/19 0711 08/27/19 0739  WBC 14.7*  --  11.7* 13.0* 11.8*  CREATININE 1.03  --  1.09 0.97 0.94  LATICACIDVEN 2.1* 1.2  --   --   --     Estimated Creatinine Clearance: 109.8 mL/min (by C-G formula based on SCr of 0.94 mg/dL).    No Known Allergies  Antimicrobials this admission: Metronidazole 11/23 x1 Vancomycin 11/23 >> 11/24 Cefepime 11/23 >>    Dose adjustments this admission: N/A  Microbiology results: 11/24 trach aspirate: mod GP rods, rare GPC, rare gram variable rod (squamous epithelial cells present) 11/24 MRSA PCR: negative 11/23Blood x 2: No growth x2 days 11/23Urine: insignificant growth, final 11/23 respiratory panel: negative  Thank you for allowing pharmacy to be a part of this patient's care.  Agnes Lawrence, PharmD PGY1 Pharmacy Resident

## 2019-08-27 NOTE — Progress Notes (Signed)
Subjective: Pt seen at the bedside this morning. Sleeping and resting comfortably in the bed. Nods head indicating yes when asked if his breathing has improved. Denies shortness of breath or chest pain. On 8L FiO2 35% with O2 saturation 93-95% in the room.  Objective:  Vital signs in last 24 hours: Vitals:   08/27/19 0049 08/27/19 0054 08/27/19 0450 08/27/19 0457  BP: (!) 144/83   (!) 155/77  Pulse: (!) 49 (!) 52 (!) 57 (!) 55  Resp:  18  (!) 22  Temp:  98.1 F (36.7 C)  98.4 F (36.9 C)  TempSrc:  Oral  Oral  SpO2:  91% 93% 95%  Weight:      Height:       Physical Exam Vitals signs and nursing note reviewed.  Constitutional:      General: He is not in acute distress.    Appearance: He is not ill-appearing.  Pulmonary:     Effort: Pulmonary effort is normal. No tachypnea or respiratory distress.     Comments: Diffuse rhonchi and wheezing throughout lungs. Air movement improved from admission. Musculoskeletal:     Right lower leg: No edema.     Left lower leg: No edema.  Skin:    General: Skin is warm and dry.  Neurological:     Mental Status: He is alert.    Assessment/Plan:  Principal Problem:   Acute respiratory failure with hypoxia (HCC) Active Problems:   Pneumonia of both lungs due to infectious organism   Diabetes (HCC)   Schizophrenia (HCC)   Bipolar I disorder, most recent episode depressed ([place])   Chronic HFrEF (heart failure with reduced ejection fraction) (HCC)   Thrombocytopenia (HCC)   Sepsis with acute hypoxic respiratory failure without septic shock ([place])   COPD exacerbation ([place])   Mr.Mcglory is a 58yearold Mwho [facility] with chronic respiratory failure s/p tracheostomy in 12/2017 after a prolonged admission for acute hypoxic respiratory failure in the setting of sepsis,HFrEF,diabetes mellitus, schizophrenia, and bipolar who presented withacute hypoxic respiratory failure. He was subsequently admitted for further evaluation and  management.    Acute Hypoxic Respiratory Failure  Chronic respiratory failure s/p tracheostomy in 12/2017 Treating as bacterial pneumonia superimposed on COPD exacerbation. Pt has remained afebrile since hospital day 1. CTA on 11/24 significant for no PE, bibasilar consolidations with small bilateral pleural effusions, and PAH. - respiratory aspirate culture with moderate gram + rods, and rate gram+ cocci/gram variable rods, no predominate species isolated yet. -blood cultures with no growth in 2 days  Pt still on 8L with FiO2 of 35% this morning. On exam, air movement improved though still with significant rhonchi and wheeze. - wean oxygen as tolerated to maintain O2 88-92.  - continue scheduled duonebs BID and solumedrol 40mg  IV daily - day 4 of cefepime due to pt's risk for MDR organism -continue mucolytics   Thrombocytopenia- resolved on 11/25 Plts 144 on presentation, with normal baseline around 200.PT/INR and aPTT normal. Hgb remainsstable. - peripheral blood smear not collected as of 11/26  - will continue to monitor   HFrEF Echo on 4/12/2019withLVEF 20-25% with mild MR, RV dilation, andpulm HTN.  - net -1L this admission - will give extra dose of furosemide 40mg  IV today due to continued hypoxia and generalized volume overload on CT from 11/24 -continuehomedigoxin 0.25mg  QD, furosemide 20mg  QD, lisinopril 10mg  QD, metoprolol tartrate 12.5mg  BID - monitor volume status closely  Diabetes Mellitus A1c 7.7 on admission. Previously taking Lantus 20u daily. -  AM Glc today 120 -continue Lantus15 unitsdaily+ SSI   Schizophrenia  Bipolar Disorder  -continuehome medications valproic acid 500mg  BID,quetiapine 200mg daily,fluoxetine 20mg daily, and clonazepam 0.25mg  BID   Diet:carb modified Fluids: none VTE CODE STATUS:FULL CODE   Dispo: Anticipated discharge pending weaning off supplemental oxygen, pt will discharge back to  , MD 08/27/2019, 7:02 AM Pager: [phone]

## 2019-08-27 NOTE — Plan of Care (Signed)

## 2019-08-28 LAB — GLUCOSE, CAPILLARY
Glucose-Capillary: 112 mg/dL — ABNORMAL HIGH (ref 70–99)
Glucose-Capillary: 221 mg/dL — ABNORMAL HIGH (ref 70–99)
Glucose-Capillary: 183 mg/dL — ABNORMAL HIGH (ref 70–99)
Glucose-Capillary: 158 mg/dL — ABNORMAL HIGH (ref 70–99)

## 2019-08-28 LAB — COMPREHENSIVE METABOLIC PANEL
ALT: 15 U/L (ref 0–44)
AST: 19 U/L (ref 15–41)
Albumin: 2.6 g/dL — ABNORMAL LOW (ref 3.5–5.0)
Alkaline Phosphatase: 43 U/L (ref 38–126)
Anion gap: 12 (ref 5–15)
BUN: 13 mg/dL (ref 6–20)
CO2: 29 mmol/L (ref 22–32)
Calcium: 9 mg/dL (ref 8.9–10.3)
Chloride: 100 mmol/L (ref 98–111)
Creatinine, Ser: 0.86 mg/dL (ref 0.61–1.24)
GFR calc Af Amer: >60 mL/min (ref >60)
GFR calc non Af Amer: >60 mL/min (ref >60)
Glucose, Bld: 147 mg/dL — ABNORMAL HIGH (ref 70–99)
Potassium: 3.7 mmol/L (ref 3.5–5.1)
Sodium: 141 mmol/L (ref 135–145)
Total Bilirubin: 0.4 mg/dL (ref 0.3–1.2)
Total Protein: 5.9 g/dL — ABNORMAL LOW (ref 6.5–8.1)

## 2019-08-28 LAB — CULTURE, RESPIRATORY W GRAM STAIN: Culture: NORMAL

## 2019-08-28 LAB — CBC
HCT: 45.7 % (ref 39.0–52.0)
Hemoglobin: 14.4 g/dL (ref 13.0–17.0)
MCH: 27.2 pg (ref 26.0–34.0)
MCHC: 31.5 g/dL (ref 30.0–36.0)
MCV: 86.2 fL (ref 80.0–100.0)
Platelets: 200 10*3/uL (ref 150–400)
RBC: 5.3 MIL/uL (ref 4.22–5.81)
RDW: 15.9 % — ABNORMAL HIGH (ref 11.5–15.5)
WBC: 12.5 10*3/uL — ABNORMAL HIGH (ref 4.0–10.5)
nRBC: 0 % (ref 0.0–0.2)

## 2019-08-28 LAB — PATHOLOGIST SMEAR REVIEW

## 2019-08-28 MED ORDER — DOXYCYCLINE HYCLATE 100 MG PO TABS
100.0000 mg | ORAL_TABLET | Freq: Two times a day (BID) | ORAL | Status: AC
  Administered 2019-08-29 – 2019-08-30 (×3): 100 mg via ORAL
  Filled 2019-08-28 (×4): qty 1

## 2019-08-28 NOTE — Progress Notes (Signed)
Duplicate charting.

## 2019-08-28 NOTE — TOC Progression Note (Signed)
Transition of Care [facility]) - Progression Note    Patient Details  Name: Tommy Mathews MRN: [id number] Date of Birth: [birth year]  [facility]) CM/SW Contact  Bartholomew Crews, RN Phone Number: [phone] 08/28/2019, 4:30 PM  Clinical Narrative:    Noted in MD note from today that patient may be ready to transition back to [facility] this weekend. Left message for Freda Munro at [facility] - call back pending.    Expected Discharge Plan: Skilled Nursing Facility(LTC) Barriers to Discharge: Continued Medical Work up  Expected Discharge Plan and Services Expected Discharge Plan: Skilled Nursing Facility(LTC)   Discharge Planning Services: CM Consult                                           Social Determinants of Health (SDOH) Interventions    Readmission Risk Interventions No flowsheet data found.

## 2019-08-28 NOTE — Plan of Care (Signed)

## 2019-08-28 NOTE — Progress Notes (Signed)
   Subjective: Pt seen at the bedside this morning. Agrees that his breathing continues to improve. On 5L of FiO2 28% this morning with O2 saturations around 95. No acute questions or concerns at this time.  Objective:  Vital signs in last 24 hours: Vitals:   08/27/19 2338 08/28/19 0006 08/28/19 0411 08/28/19 0520  BP:  (!) 146/83 (!) 152/87   Pulse: (!) 59 (!) 55 (!) 54 (!) 57  Resp:  20 18 18   Temp:  98.7 F (37.1 C) 98.3 F (36.8 C)   TempSrc:  Oral Oral   SpO2: 93% 94% 93% 93%  Weight:      Height:       Physical Exam Vitals signs and nursing note reviewed.  Constitutional:      General: He is not in acute distress.    Appearance: He is not ill-appearing.  Cardiovascular:     Rate and Rhythm: Normal rate and regular rhythm.     Heart sounds: Normal heart sounds.  Pulmonary:     Effort: Pulmonary effort is normal. No tachypnea or respiratory distress.     Comments: On 5L at 28% FiO2 Improved air movement. Scattered rhonchi. Skin:    General: Skin is warm and dry.  Neurological:     Mental Status: He is alert.    Assessment/Plan:  Principal Problem:   Acute respiratory failure with hypoxia (HCC) Active Problems:   Pneumonia of both lungs due to infectious organism   Diabetes (HCC)   Schizophrenia (HCC)   Bipolar I disorder, most recent episode depressed ([place])   Chronic HFrEF (heart failure with reduced ejection fraction) (HCC)   Thrombocytopenia (HCC)   Sepsis with acute hypoxic respiratory failure without septic shock ([place])   COPD exacerbation ([place])   Tommy Mathews is a 58yearold Mwho lives at [facility] with chronic respiratory failure s/p tracheostomy in 12/2017 after a prolonged admission for acute hypoxic respiratory failure in the setting of sepsis,HFrEF,diabetes mellitus, schizophrenia, and bipolar who presented withacute hypoxic respiratory failure. He was subsequently admitted for further evaluation and management.    Acute Hypoxic Respiratory  Failure  Chronic respiratory failure s/p tracheostomy in 12/2017 Treating asbacterial pneumonia superimposed onCOPD exacerbation. CTA on 11/24significant for bibasilar consolidations with small bilateral pleural effusions, and PAH. - respiratory aspirate culturefrom trach polymicrobial at this time without predominate species -blood cultureswith no growth in 3 days  Pt still on supplemental oxygen with 5L with FiO2 of 28%. Exam with improved air movement and scattered rhonchi and wheeze - wean oxygen as tolerated to maintain O2 88-92.  -continuescheduled duonebs BID  - day 4/5 solumedrol 40mg  IV daily - day 5/7 of cefepime due to pt's risk for MDR organism -continue mucolytics  HFrEF Echo on 4/12/2019withLVEF 20-25% with mild MR, RV dilation, andpulm HTN. - net -967cc this admission and net -401cc yesterday after extra dose of furosemide 40mg  IV -continuehomedigoxin 0.25mg  QD, furosemide 20mg  QD, lisinopril 10mg  QD, metoprolol tartrate 12.5mg  BID -appears euvolemic, though will monitor volume status closely  Diabetes Mellitus A1c 7.7 on admission. Previously taking Lantus 20u daily. AM Glc today 112. -continue Lantus15 unitsdaily+ SSI   Schizophrenia  Bipolar Disorder  -continuehome medications valproic acid 500mg  BID,quetiapine 200mg daily,fluoxetine 20mg daily, and clonazepam 0.25mg  BID   Diet:carb modified Fluids: none VTE [id number] CODE STATUS:FULL CODE   Dispo: Anticipated discharge in approximately 1-2 day(s).   Ladona Horns, MD 08/28/2019, 6:25 AM Pager: [phone]

## 2019-08-29 ENCOUNTER — Other Ambulatory Visit: Payer: Self-pay

## 2019-08-29 ENCOUNTER — Encounter (HOSPITAL_COMMUNITY): Payer: Self-pay

## 2019-08-29 DIAGNOSIS — I2721 Secondary pulmonary arterial hypertension: Secondary | ICD-10-CM

## 2019-08-29 LAB — BASIC METABOLIC PANEL
Anion gap: 12 (ref 5–15)
BUN: 14 mg/dL (ref 6–20)
CO2: 27 mmol/L (ref 22–32)
Calcium: 8.9 mg/dL (ref 8.9–10.3)
Chloride: 100 mmol/L (ref 98–111)
Creatinine, Ser: 0.94 mg/dL (ref 0.61–1.24)
GFR calc Af Amer: >60 mL/min (ref >60)
GFR calc non Af Amer: >60 mL/min (ref >60)
Glucose, Bld: 153 mg/dL — ABNORMAL HIGH (ref 70–99)
Potassium: 4.1 mmol/L (ref 3.5–5.1)
Sodium: 139 mmol/L (ref 135–145)

## 2019-08-29 LAB — CULTURE, BLOOD (ROUTINE X 2)
Culture: NO GROWTH
Culture: NO GROWTH
Special Requests: ADEQUATE

## 2019-08-29 LAB — GLUCOSE, CAPILLARY
Glucose-Capillary: 174 mg/dL — ABNORMAL HIGH (ref 70–99)
Glucose-Capillary: 176 mg/dL — ABNORMAL HIGH (ref 70–99)
Glucose-Capillary: 188 mg/dL — ABNORMAL HIGH (ref 70–99)
Glucose-Capillary: 214 mg/dL — ABNORMAL HIGH (ref 70–99)
Glucose-Capillary: 150 mg/dL — ABNORMAL HIGH (ref 70–99)

## 2019-08-29 LAB — CBC
HCT: 46.3 % (ref 39.0–52.0)
Hemoglobin: 14.5 g/dL (ref 13.0–17.0)
MCH: 26.9 pg (ref 26.0–34.0)
MCHC: 31.3 g/dL (ref 30.0–36.0)
MCV: 85.7 fL (ref 80.0–100.0)
Platelets: 212 10*3/uL (ref 150–400)
RBC: 5.4 MIL/uL (ref 4.22–5.81)
RDW: 15.8 % — ABNORMAL HIGH (ref 11.5–15.5)
WBC: 10.9 10*3/uL — ABNORMAL HIGH (ref 4.0–10.5)
nRBC: 0 % (ref 0.0–0.2)

## 2019-08-29 MED ORDER — ALBUTEROL SULFATE (2.5 MG/3ML) 0.083% IN NEBU
2.5000 mg | INHALATION_SOLUTION | RESPIRATORY_TRACT | Status: DC | PRN
  Administered 2019-08-31: 2.5 mg via RESPIRATORY_TRACT
  Filled 2019-08-29 (×3): qty 3

## 2019-08-29 NOTE — Progress Notes (Signed)
Pt refused CPT 

## 2019-08-29 NOTE — Progress Notes (Signed)
   Subjective: HD#5   Overnight: No acute overnight events reported.  Today, Tommy Mathews is able to answer yes or no for me as much as he can but he currently denies fevers, chills, shortness of breath.  Initially he had refused chest physiotherapy is now agreeable.  He does report significant improvement to his clinical status since admission.  Objective:  Vital signs in last 24 hours: Vitals:   08/29/19 0140 08/29/19 0400 08/29/19 0511 08/29/19 0556  BP:   (!) 141/93   Pulse: 77 62 62   Resp: 16 16    Temp:   98.7 F (37.1 C)   TempSrc:   Oral   SpO2: 95% 94% 92%   Weight:    105.5 kg  Height:       Const: In no apparent distress, lying comfortably in bed, conversational Resp: CTA BL, no wheezes, crackles, rhonchi, remains on 5L via trach collar 26% FiO2  Assessment/Plan:  Principal Problem:   Acute respiratory failure with hypoxia (HCC) Active Problems:   Pneumonia of both lungs due to infectious organism   Diabetes (HCC)   Schizophrenia (HCC)   Bipolar I disorder, most recent episode depressed (HCC)   Chronic HFrEF (heart failure with reduced ejection fraction) (HCC)   Thrombocytopenia (HCC)   Sepsis with acute hypoxic respiratory failure without septic shock (HCC)   COPD exacerbation (HCC)  Tommy Mathews is a 58yearold Mwho lives [facility] with chronic respiratory failure s/p tracheostomy in 12/2017 after a prolonged admission for acute hypoxic respiratory failure in the setting of sepsis,HFrEF,diabetes mellitus, schizophrenia, and bipolar who presented withacute hypoxic respiratory failure. He was subsequently admitted for further evaluation and management.    Acute Hypoxic Respiratory Failure  Chronic respiratory failure s/p tracheostomy in 12/2017 Treating asbacterial pneumonia superimposed onCOPD exacerbation. CTAon 11/24significant for bibasilar consolidations with small bilateral pleural effusions, and PAH.  Pt still on supplemental oxygen  with 5L with FiO2 of 28%. - Wean oxygen as toleratedto maintain O2 88-92.  -Continuescheduled duonebs BID  - s/p 5/5 solumedrol 40mg  IV daily - s/p 5/5 ofcefepime (transitioned to doxycycline to finish a 7-day course) -Continue mucolytics - Chest physiotherapy   HFrEF Echo on 4/12/2019withLVEF 20-25% with mild MR, RV dilation, andpulm HTN. -continuehomedigoxin 0.25mg  QD, furosemide 20mg  QD, lisinopril 10mg  QD - Metoprolol held due to bradycardia -appears euvolemic   Diabetes Mellitus -continue Lantus15 unitsdaily+ SSI    Schizophrenia  Bipolar Disorder  -continuehome medications valproic acid 500mg  BID,quetiapine 200mg daily,fluoxetine 20mg daily, and clonazepam 0.25mg  BID   Diet:carb modified Fluids: none VTE ZOX:[id number] CODE STATUS:FULL CODE   Dispo: Anticipated discharge in approximately 1-2 day(s).   Jean Rosenthal, MD 08/29/2019, 7:42 AM Pager: [phone] Internal Medicine Teaching Service

## 2019-08-29 NOTE — Progress Notes (Signed)
Pt just ate, will do CPT next tx.

## 2019-08-29 NOTE — Plan of Care (Signed)
  Problem: Education: Goal: Knowledge of General Education information will improve Description: Including pain rating scale, medication(s)/side effects and non-pharmacologic comfort measures Outcome: Progressing   Problem: Health Behavior/Discharge Planning: Goal: Ability to manage health-related needs will improve Outcome: Progressing   Problem: Clinical Measurements: Goal: Will remain free from infection Outcome: Progressing Goal: Diagnostic test results will improve Outcome: Progressing Goal: Cardiovascular complication will be avoided Outcome: Progressing   Problem: Activity: Goal: Risk for activity intolerance will decrease Outcome: Progressing   Problem: Nutrition: Goal: Adequate nutrition will be maintained Outcome: Progressing   Problem: Coping: Goal: Level of anxiety will decrease Outcome: Progressing   Problem: Elimination: Goal: Will not experience complications related to bowel motility Outcome: Progressing Goal: Will not experience complications related to urinary retention Outcome: Progressing   Problem: Pain Managment: Goal: General experience of comfort will improve Outcome: Progressing   Problem: Safety: Goal: Ability to remain free from injury will improve Outcome: Progressing   Problem: Skin Integrity: Goal: Risk for impaired skin integrity will decrease Outcome: Progressing   

## 2019-08-30 LAB — BASIC METABOLIC PANEL
Anion gap: 13 (ref 5–15)
BUN: 18 mg/dL (ref 6–20)
CO2: 28 mmol/L (ref 22–32)
Calcium: 9.1 mg/dL (ref 8.9–10.3)
Chloride: 99 mmol/L (ref 98–111)
Creatinine, Ser: 0.95 mg/dL (ref 0.61–1.24)
GFR calc Af Amer: >60 mL/min (ref >60)
GFR calc non Af Amer: >60 mL/min (ref >60)
Glucose, Bld: 111 mg/dL — ABNORMAL HIGH (ref 70–99)
Potassium: 4.5 mmol/L (ref 3.5–5.1)
Sodium: 140 mmol/L (ref 135–145)

## 2019-08-30 LAB — CBC
HCT: 47 % (ref 39.0–52.0)
Hemoglobin: 14.7 g/dL (ref 13.0–17.0)
MCH: 27 pg (ref 26.0–34.0)
MCHC: 31.3 g/dL (ref 30.0–36.0)
MCV: 86.2 fL (ref 80.0–100.0)
Platelets: 263 10*3/uL (ref 150–400)
RBC: 5.45 MIL/uL (ref 4.22–5.81)
RDW: 15.8 % — ABNORMAL HIGH (ref 11.5–15.5)
WBC: 14.6 10*3/uL — ABNORMAL HIGH (ref 4.0–10.5)
nRBC: 0.1 % (ref 0.0–0.2)

## 2019-08-30 LAB — GLUCOSE, CAPILLARY
Glucose-Capillary: 98 mg/dL (ref 70–99)
Glucose-Capillary: 173 mg/dL — ABNORMAL HIGH (ref 70–99)
Glucose-Capillary: 132 mg/dL — ABNORMAL HIGH (ref 70–99)
Glucose-Capillary: 143 mg/dL — ABNORMAL HIGH (ref 70–99)

## 2019-08-30 LAB — SARS CORONAVIRUS 2 (TAT 6-24 HRS): SARS Coronavirus 2: NEGATIVE

## 2019-08-30 NOTE — Progress Notes (Signed)
Patient's heart rate spiked to the 140's while moving around in the bed. Upon assessment, patient's bp 156/93. Asymptomatic. Denies any complaints. Dr. Court Joy paged to make aware of situation. Will continue to monitor.  Hiram Comber, RN 08/30/2019 4:48 PM

## 2019-08-30 NOTE — Progress Notes (Signed)
   Subjective: HD#6   Overnight: No acute events reported.  Today, Tommy Mathews was examined at bedside and was found working with respiratory therapy.  His procedure was going quite well and he felt comfortable.  He denies worsening dyspnea, fevers or chills.  He still has quite a bit of sputum production which respiratory therapy helps to suction.  Objective:  Vital signs in last 24 hours: Vitals:   08/29/19 2135 08/30/19 0045 08/30/19 0435 08/30/19 0450  BP: (!) 151/86  (!) 162/89   Pulse: 61  (!) 56 (!) 58  Resp:      Temp: 97.9 F (36.6 C)  98.1 F (36.7 C)   TempSrc: Oral  Oral   SpO2: 92% 95% 91% 93%  Weight:      Height:       Const: In no apparent distress, lying comfortably in bed Resp: CTA BL anteriorly, no wheezes, crackles, rhonchi   Assessment/Plan:  Principal Problem:   Acute respiratory failure with hypoxia (HCC) Active Problems:   Pneumonia of both lungs due to infectious organism   Diabetes (HCC)   Schizophrenia (HCC)   Bipolar I disorder, most recent episode depressed (HCC)   Chronic HFrEF (heart failure with reduced ejection fraction) (HCC)   Thrombocytopenia (HCC)   Sepsis with acute hypoxic respiratory failure without septic shock ([place])   COPD exacerbation ([place])  Tommy Mathews is a 58yearold Mwho lives at [facility] with chronic respiratory failure s/p tracheostomy in 12/2017 after a prolonged admission for acute hypoxic respiratory failure in the setting of sepsis,HFrEF,diabetes mellitus, schizophrenia, and bipolar who presented withacute hypoxic respiratory failure. He was subsequently admitted for further evaluation and management.    Acute Hypoxic Respiratory Failure  Chronic respiratory failure s/p tracheostomy in 12/2017 Treating asbacterial pneumonia superimposed onCOPD exacerbation. CTAon 11/24significant for bibasilar consolidations with small bilateral pleural effusions, and PAH.  He is still onsupplemental oxygen with  5Lwith FiO2 of28%. - Wean oxygen as toleratedto maintain O2 88-92.  -Continuescheduled duonebs BID - s/p 5/5solumedrol 40mg  IV daily - s/p5/5ofcefepime (transitioned to doxycycline to finish a 7-day course) -Continue mucolytics - Chest physiotherapy  He has remained stable on 5 L supplemental oxygen via trach collar with 20% of FiO2 for the past 4 days.  Given his improved clinical condition, we believe he will be stable to discharge back to his LTAC for continued aggressive support.   HFrEF Echo on 4/12/2019withLVEF 20-25% with mild MR, RV dilation, andpulm HTN. -continuehomedigoxin 0.25mg  QD, furosemide 20mg  QD, lisinopril 10mg  QD - Metoprolol held due to bradycardia -appears euvolemic   Diabetes Mellitus -continue Lantus15 unitsdaily+ SSI    Schizophrenia  Bipolar Disorder  -continuehome medications valproic acid 500mg  BID,quetiapine 200mg daily,fluoxetine 20mg daily, and clonazepam 0.25mg  BID   Diet:carb modified Fluids: none VTE TTS:[id number] CODE STATUS:FULL CODE   Dispo: Anticipated discharge in approximately1-2day(s).   Jean Rosenthal, MD 08/30/2019, 6:32 AM Pager: [phone] Internal Medicine Teaching Service

## 2019-08-30 NOTE — Plan of Care (Signed)

## 2019-08-31 DIAGNOSIS — J159 Unspecified bacterial pneumonia: Secondary | ICD-10-CM

## 2019-08-31 LAB — GLUCOSE, CAPILLARY
Glucose-Capillary: 99 mg/dL (ref 70–99)
Glucose-Capillary: 199 mg/dL — ABNORMAL HIGH (ref 70–99)
Glucose-Capillary: 104 mg/dL — ABNORMAL HIGH (ref 70–99)
Glucose-Capillary: 208 mg/dL — ABNORMAL HIGH (ref 70–99)

## 2019-08-31 LAB — BASIC METABOLIC PANEL
Anion gap: 9 (ref 5–15)
BUN: 14 mg/dL (ref 6–20)
CO2: 29 mmol/L (ref 22–32)
Calcium: 8.9 mg/dL (ref 8.9–10.3)
Chloride: 100 mmol/L (ref 98–111)
Creatinine, Ser: 0.93 mg/dL (ref 0.61–1.24)
GFR calc Af Amer: >60 mL/min (ref >60)
GFR calc non Af Amer: >60 mL/min (ref >60)
Glucose, Bld: 115 mg/dL — ABNORMAL HIGH (ref 70–99)
Potassium: 3.9 mmol/L (ref 3.5–5.1)
Sodium: 138 mmol/L (ref 135–145)

## 2019-08-31 NOTE — Progress Notes (Signed)
Patient on oxygen 4 L - sat O2 91%. No complaints at this time. Will continue to monitor.

## 2019-08-31 NOTE — Progress Notes (Addendum)
   Subjective: Pt seen at the bedside this morning. On 5L FiO2 28% currently. Agrees that he is feeling well. Denies any acute concerns at this time.  Objective:  Vital signs in last 24 hours: Vitals:   08/30/19 2027 08/30/19 2047 08/31/19 0039 08/31/19 0506  BP:  (!) 141/91 134/88 116/78  Pulse: 77 (!) 58 67 66  Resp: (!) 21  19   Temp:  (!) 97.5 F (36.4 C) 98 F (36.7 C) 98.5 F (36.9 C)  TempSrc:  Oral Axillary Oral  SpO2: 96% 90% 94%   Weight:      Height:       Physical Exam Constitutional:      General: He is not in acute distress. Pulmonary:     Effort: Pulmonary effort is normal. No tachypnea or respiratory distress.     Breath sounds: No decreased air movement.     Comments: Scattered occasional expiratory wheeze, otherwise normal breath sounds. Musculoskeletal:     Right lower leg: No edema.     Left lower leg: No edema.  Skin:    General: Skin is warm and dry.  Neurological:     Mental Status: He is alert.    Assessment/Plan:  Principal Problem:   Pneumonia of both lungs due to infectious organism Active Problems:   Diabetes ([place])   Chronic HFrEF (heart failure with reduced ejection fraction) (HCC)   Thrombocytopenia (HCC)   Sepsis with acute hypoxic respiratory failure without septic shock (HCC)   COPD exacerbation (HCC)   Tommy Mathews is a 58yearold Mwho lives at [facility] with chronic respiratory failure s/p tracheostomy in 12/2017 after a prolonged admission for acute hypoxic respiratory failure in the setting of sepsis,HFrEF,diabetes mellitus, schizophrenia, and bipolar who presented withacute hypoxic respiratory failure. He was subsequently admitted for further evaluation and management.    Acute Hypoxic Respiratory Failure  Chronic respiratory failure s/p tracheostomy in 12/2017 Treated asbacterial pneumonia superimposed onCOPD exacerbation. Pt still on supplemental oxygen with 5L with FiO2 of 28%. Breath sounds much improved on  exam - wean oxygen as toleratedto maintain O2 88-92.  -continuescheduled duonebs BID  -continue mucolytics - completed 5 days solumedrol 40mg  IV daily - completed 5dayscefepime then 2 days of doxycycline  - CTAon 11/24significant for bibasilar consolidations with small bilateral pleural effusions, and PAH. - respiratory aspirate culturefrom trach with normal respiratory flora -blood cultures on admission negative   HFrEF Echo on 4/12/2019withLVEF 20-25% with mild MR, RV dilation, andpulm HTN. -continuehomedigoxin 0.25mg  QD, furosemide 20mg  QD, and lisinopril 10mg  QD - metoprolol tartrate 12.5mg  BID held due to bradycardia - continues to be euvolemic on exam  Diabetes Mellitus A1c 7.7 on admission. Previously taking Lantus 20u daily. -continue Lantus15 unitsdaily+ SSI   Schizophrenia  Bipolar Disorder  -continuehome medications valproic acid 500mg  BID,quetiapine 200mg daily,fluoxetine 20mg daily, and clonazepam 0.25mg  BID   Diet:carb modified Fluids: none VTE WFU:[id number] CODE STATUS:FULL CODE   Dispo: Medically stable for discharge whenever bed available at home LTAC. COVID negative 11/29.  Ladona Horns, MD 08/31/2019, 6:24 AM Pager: [phone]

## 2019-09-01 DIAGNOSIS — F319 Bipolar disorder, unspecified: Secondary | ICD-10-CM

## 2019-09-01 DIAGNOSIS — I5022 Chronic systolic (congestive) heart failure: Secondary | ICD-10-CM

## 2019-09-01 DIAGNOSIS — Z79899 Other long term (current) drug therapy: Secondary | ICD-10-CM

## 2019-09-01 DIAGNOSIS — F209 Schizophrenia, unspecified: Secondary | ICD-10-CM

## 2019-09-01 DIAGNOSIS — Z930 Tracheostomy status: Secondary | ICD-10-CM

## 2019-09-01 DIAGNOSIS — E119 Type 2 diabetes mellitus without complications: Secondary | ICD-10-CM

## 2019-09-01 DIAGNOSIS — Z9981 Dependence on supplemental oxygen: Secondary | ICD-10-CM

## 2019-09-01 DIAGNOSIS — Z794 Long term (current) use of insulin: Secondary | ICD-10-CM

## 2019-09-01 DIAGNOSIS — J181 Lobar pneumonia, unspecified organism: Secondary | ICD-10-CM

## 2019-09-01 DIAGNOSIS — J9621 Acute and chronic respiratory failure with hypoxia: Secondary | ICD-10-CM

## 2019-09-01 DIAGNOSIS — J449 Chronic obstructive pulmonary disease, unspecified: Secondary | ICD-10-CM

## 2019-09-01 LAB — CBC
HCT: 49.7 % (ref 39.0–52.0)
Hemoglobin: 15.3 g/dL (ref 13.0–17.0)
MCH: 26.3 pg (ref 26.0–34.0)
MCHC: 30.8 g/dL (ref 30.0–36.0)
MCV: 85.5 fL (ref 80.0–100.0)
Platelets: 205 10*3/uL (ref 150–400)
RBC: 5.81 MIL/uL (ref 4.22–5.81)
RDW: 16 % — ABNORMAL HIGH (ref 11.5–15.5)
WBC: 8.7 10*3/uL (ref 4.0–10.5)
nRBC: 0 % (ref 0.0–0.2)

## 2019-09-01 LAB — BASIC METABOLIC PANEL
Anion gap: 12 (ref 5–15)
BUN: 17 mg/dL (ref 6–20)
CO2: 27 mmol/L (ref 22–32)
Calcium: 9 mg/dL (ref 8.9–10.3)
Chloride: 99 mmol/L (ref 98–111)
Creatinine, Ser: 0.9 mg/dL (ref 0.61–1.24)
GFR calc Af Amer: >60 mL/min (ref >60)
GFR calc non Af Amer: >60 mL/min (ref >60)
Glucose, Bld: 99 mg/dL (ref 70–99)
Potassium: 4 mmol/L (ref 3.5–5.1)
Sodium: 138 mmol/L (ref 135–145)

## 2019-09-01 LAB — GLUCOSE, CAPILLARY
Glucose-Capillary: 112 mg/dL — ABNORMAL HIGH (ref 70–99)
Glucose-Capillary: 170 mg/dL — ABNORMAL HIGH (ref 70–99)
Glucose-Capillary: 111 mg/dL — ABNORMAL HIGH (ref 70–99)
Glucose-Capillary: 158 mg/dL — ABNORMAL HIGH (ref 70–99)

## 2019-09-01 MED ORDER — FUROSEMIDE 20 MG PO TABS: 20.0000 mg | ORAL_TABLET | Freq: Every day | ORAL | 0 refills | Status: DC

## 2019-09-01 MED ORDER — FLUOXETINE HCL 20 MG PO CAPS: 20.0000 mg | ORAL_CAPSULE | Freq: Every day | ORAL | 0 refills | Status: AC

## 2019-09-01 MED ORDER — LISINOPRIL 10 MG PO TABS: 10.0000 mg | ORAL_TABLET | Freq: Every day | ORAL | 0 refills | Status: DC

## 2019-09-01 MED ORDER — DIGOXIN 250 MCG PO TABS: 0.2500 mg | ORAL_TABLET | Freq: Every day | ORAL | 0 refills | Status: AC

## 2019-09-01 MED ORDER — QUETIAPINE FUMARATE 200 MG PO TABS: 200.0000 mg | ORAL_TABLET | Freq: Every day | ORAL | 0 refills | Status: AC

## 2019-09-01 NOTE — Progress Notes (Signed)
NAME:  Tommy Mathews, MRN:  [id number], DOB:  [birth year], LOS: 8 ADMISSION DATE:  08/24/2019, Primary: Patient, [name]  CHIEF COMPLAINT:  hypoxia   Brief History  58 yo male with PMH of chronic respiratory failure with tracheostomy placement in 2019, HFrEF, DM, schizophrenia and bipolar disorder who resides at [facility] living facility for acute hypoxic respiratory failure.  Tracheostomy placed following prolonged period of intubation in 2019 secondary to severe sepsis. On RA prior to admission.   Subjective  VSS. No overnight events. No complaints this morning.  Objective   Blood pressure 106/82, pulse 80, temperature 98.4 F (36.9 C), temperature source Oral, resp. rate 19, height 6\' 1"  (1.854 m), weight 105.5 kg, SpO2 90 %.     Intake/Output Summary (Last 24 hours) at 09/01/2019 0610 Last data filed at 09/01/2019 0443 Gross per 24 hour  Intake 230 ml  Output 1100 ml  Net -870 ml   Filed Weights   08/24/19 0700 08/24/19 1730 08/29/19 0556  Weight: 120 kg 103.9 kg 105.5 kg    Examination: GENERAL: in no acute distress HEENT: tracheostomy present CARDIAC: No peripheral edema. No JVD PULMONARY: rhonchi appreciated bilaterally. Currently on 4L supplemental O2 SKIN: no rash or lesions on limited exam    Significant Diagnostic Tests:  11/23 CXR: findings suggestive of CHF with pulm edema and mild cardiomegaly. 11/24 CTA chest: limited evaluation due to motion artifact. No pe noted. Main pulm artery approx 4cm. Rounded opacity of LLL. Evidence of volume overload.  Micro Data:  11/23 BC: no growth 11/23 UC: no significant growth 11/24 respriatory culture: NGTD Antimicrobials:  Cefepime 11/23>>11/28 Doxycycline 11/28>>11/30  Labs    CBC Latest Ref Rng & Units 08/30/2019 08/29/2019 08/28/2019  WBC 4.0 - 10.5 K/uL 14.6(H) 10.9(H) 12.5(H)  Hemoglobin 13.0 - 17.0 g/dL 14.7 14.5 14.4  Hematocrit 39.0 - 52.0 % 47.0 46.3 45.7  Platelets 150 - 400 K/uL 263 212 200    BMP Latest Ref Rng & Units 09/01/2019 08/31/2019 08/30/2019  Glucose 70 - 99 mg/dL 99 115(H) 111(H)  BUN 6 - 20 mg/dL 17 14 18   Creatinine 0.61 - 1.24 mg/dL 0.90 0.93 0.95  Sodium 135 - 145 mmol/L 138 138 140  Potassium 3.5 - 5.1 mmol/L 4.0 3.9 4.5  Chloride 98 - 111 mmol/L 99 100 99  CO2 22 - 32 mmol/L 27 29 28   Calcium 8.9 - 10.3 mg/dL 9.0 8.9 9.1    Summary  58 yo male with PMH of chronic respiratory failure with tracheostomy placed in 2019 presenting with acute on chronic respiratory failure and found to have bilateral interstitial infiltrates consistent with pneumonia vs heart failure exacerbation.  Assessment & Plan:  Principal Problem:   Pneumonia of both lungs due to infectious organism Active Problems:   Diabetes ([place])   Chronic HFrEF (heart failure with reduced ejection fraction) (HCC)   Thrombocytopenia (HCC)   Sepsis with acute hypoxic respiratory failure without septic shock (HCC)   COPD exacerbation (HCC)  Acute on chronic respiratory failure with tracheostomy. CXR on admission consistent with pneumonia. Completed 5d course of cefepime + 2d course of doxy for presumed pneumonia. Tracheal aspriates with normal respiratory flora. Afebrile. Still requiring 4L supplemental oxygen which he does not require at baseline. Suspect that he will have prolonged respiratory sx secondary to chronic respiratory failure. If there is not improvement over several weeks, would consider continued evaluation for heart failure exacerbation however this is less likely given his BNP <100 on admission and lack of  PE findings. HFrEF. Last echo in 2019 suggests LVEF 20-25%. Imaging during this admission suggestive of volume overload with PA diameter of approx 4cm. Physical exam findings not consistent with hypervolemia. Plan Continue lasix 20mg  Continue to work on weaning O2 supplementation Continue dignoxin and lisinopril.  Diabetes mellitus. Continue latus + SSI. Schizoprenia. Bipolar disorder.  Continue valproic acid, quetiapine, fluoxetine and clonazepam Best practice:  CODE STATUS: full Diet: diabetic DVT for prophylaxis: lovenox Dispo: discharge today   , MD INTERNAL MEDICINE RESIDENT PGY-1 PAGER #: [phone] 09/01/19 6:10 AM

## 2019-09-01 NOTE — Progress Notes (Addendum)
Patient was discharged to nursing home [facility]) by MD order; discharged instructions review and sent to facility with care notes; IV DIC; facility was called and report was given to the nurse who is going to receive the patient; patient will be transported to facility via [place].

## 2019-09-01 NOTE — Progress Notes (Signed)
Pt discharged off unit in route to [facility].

## 2019-09-01 NOTE — Discharge Summary (Addendum)
Name: Tommy Mathews MRN: [id number] DOB: [birth year] 58 y.o. PCP: Patient, [name]  Date of Admission: 08/24/2019  5:58 AM Date of Discharge:  09/01/2019 Attending Physician: Tommy Mathews, *  Discharge Diagnosis:  Principal Problem:   Pneumonia of both lungs due to infectious organism Active Problems:   Diabetes (HCC)   Chronic HFrEF (heart failure with reduced ejection fraction) (HCC)   Thrombocytopenia (HCC)   Sepsis with acute hypoxic respiratory failure without septic shock (HCC)   COPD exacerbation (HCC)   Discharge Medications: Allergies as of 09/01/2019   No Known Allergies      Medication List     STOP taking these medications    escitalopram 5 MG tablet Commonly known as: LEXAPRO   feeding supplement (GLUCERNA 1.5 CAL) Liqd   FLUoxetine 20 MG/5ML solution Commonly known as: PROZAC Replaced by: FLUoxetine 20 MG capsule   free water Soln   mouth rinse Liqd solution   nystatin 100000 UNIT/ML suspension Commonly known as: MYCOSTATIN   scopolamine 1 MG/3DAYS Commonly known as: TRANSDERM-SCOP   valproic acid 250 MG/5ML solution Commonly known as: DEPAKENE       TAKE these medications    acetylcysteine 10% Soln Commonly known as: MUCOMYST Take 4 mLs by nebulization every 12 (twelve) hours as needed (secretions).   bisacodyl 10 MG suppository Commonly known as: DULCOLAX Place 1 suppository (10 mg total) rectally daily as needed for moderate constipation.   budesonide 0.5 MG/2ML nebulizer solution Commonly known as: PULMICORT Take 2 mLs (0.5 mg total) by nebulization 2 (two) times daily.   clobetasol cream 0.05 % Commonly known as: TEMOVATE Apply 1 application topically 2 (two) times daily. Apply to back and chest   clonazePAM 0.5 MG tablet Commonly known as: KLONOPIN Take 0.5 tablets (0.25 mg total) by mouth 2 (two) times daily.   digoxin 0.25 MG tablet Commonly known as: LANOXIN Take 1 tablet (0.25 mg total) by mouth  daily. Start taking on: September 02, 2019 What changed: how to take this   divalproex 500 MG DR tablet Commonly known as: DEPAKOTE Take 500 mg by mouth 2 (two) times daily.   docusate sodium 100 MG capsule Commonly known as: COLACE Take 100 mg by mouth 2 (two) times daily. What changed: Another medication with the same name was removed. Continue taking this medication, and follow the directions you see here.   FLUoxetine 20 MG capsule Commonly known as: PROZAC Take 1 capsule (20 mg total) by mouth daily. Start taking on: September 02, 2019 Replaces: FLUoxetine 20 MG/5ML solution   furosemide 20 MG tablet Commonly known as: LASIX Take 1 tablet (20 mg total) by mouth daily. Start taking on: September 02, 2019   gabapentin 100 MG capsule Commonly known as: NEURONTIN Take 200 mg by mouth every 12 (twelve) hours. What changed: Another medication with the same name was removed. Continue taking this medication, and follow the directions you see here.   guaiFENesin 600 MG 12 hr tablet Commonly known as: MUCINEX Take 600 mg by mouth 2 (two) times daily. What changed: Another medication with the same name was removed. Continue taking this medication, and follow the directions you see here.   insulin glargine 100 UNIT/ML injection Commonly known as: LANTUS Inject 0.2 mLs (20 Units total) into the skin at bedtime. What changed: how much to take   ipratropium-albuterol 0.5-2.5 (3) MG/3ML Soln Commonly known as: DUONEB Take 3 mLs by nebulization every 4 (four) hours as needed.   ipratropium-albuterol 0.5-2.5 (3) MG/3ML  Soln Commonly known as: DUONEB Take 3 mLs by nebulization every 4 (four) hours.   ketoconazole 2 % cream Commonly known as: NIZORAL Apply topically daily as needed for irritation.   lisinopril 10 MG tablet Commonly known as: ZESTRIL Take 1 tablet (10 mg total) by mouth daily. Start taking on: September 02, 2019 What changed:  how to take this additional instructions    magnesium oxide 400 (241.3 Mg) MG tablet Commonly known as: MAG-OX Place 1 tablet (400 mg total) into feeding tube 2 (two) times daily. What changed: how to take this   metoprolol tartrate 25 MG tablet Commonly known as: LOPRESSOR Place 0.5 tablets (12.5 mg total) into feeding tube every 6 (six) hours. Please hold for BP < 130 mmhg What changed: how to take this   pantoprazole sodium 40 mg/20 mL Pack Commonly known as: PROTONIX Place 20 mLs (40 mg total) into feeding tube daily. What changed:  how to take this additional instructions   QUEtiapine 200 MG tablet Commonly known as: SEROQUEL Take 1 tablet (200 mg total) by mouth at bedtime. What changed:  how to take this Another medication with the same name was removed. Continue taking this medication, and follow the directions you see here.   sodium chloride HYPERTONIC 3 % nebulizer solution Take 4 mLs by nebulization daily.   triamcinolone cream 0.5 % Commonly known as: KENALOG Apply 1 application topically 2 (two) times daily. Apply to facial rash        Disposition and follow-up:   Tommy.Tommy [facility].  At the Mathews follow up visit please address:  1.  Acute on chronic hypoxic respiratory failure. Likely 2/2 pneumonia. Completed 5d course of cefepime and 2d course of doxycycline during hospitalization. Previously on room air. Now requiring 4L supplemental oxygen. Please re-evaluate for oxygen needs and work on weaning as able.  2.  Labs / imaging needed at time of follow-up: none  3.  Pending labs/ test needing follow-up: none  Follow-up Appointments: PCP within 5d of discharge    Mathews Course: 58 yo male who [facility] for evaluation of acute on chronic respiratory failure, fever and leukocytosis. Initially required 10L supplemental oxygen. CXR at time of admission significant for diffuse bilateral infiltrates. BNP < 100 and  no significant findings of fluid overload, essentially ruling out heart failure exacerbation. He was started on vancomycin and cefepime for presumed pneumonia. Received 3d course of vancomycin, 6d course of cefepime and 2d course of doxycycline. He also received 5d course of solumedrol. Blood and respiratory cultures negative.  Fever resolved over his hospitalization and he was able to be weaned to 4L supplemental oxygen. White count 8.7 on day of discharge.   Discharge Vitals:   BP 98/69 (BP Location: Right Arm)   Pulse 70   Temp 98.2 F (36.8 C) (Oral)   Resp 17   Ht 6\' 1"  (1.854 m)   Wt 105.5 kg   SpO2 90%   BMI 30.69 kg/m   Pertinent Labs, Studies, and Procedures:  CTA chest: bibasilar consolidation and atelectasis with small bilateral pleural effusions. Cardiomegaly present. Vascular findings consistent with PAH. Also noted: hepatic steatosis and cholelithiasis without signs of acute cholecystitis.  Discharge Instructions: Discharge Instructions     Diet - low sodium heart healthy   Complete by: As directed    Increase activity slowly   Complete by: As directed        Signed: Windle Huebert,  Tanique Matney, MD 09/01/2019, 1:32 PM   Pager: [phone]

## 2019-09-01 NOTE — TOC Transition Note (Addendum)
Transition of Care [facility]) - CM/SW Discharge Note   Patient Details  Name: Tommy Mathews MRN: [id number] Date of Birth: March 26, 1961  Transition of Care [facility]) CM/SW Contact:  Sharin Mons, RN Phone Number: [phone] 09/01/2019, 1:22 PM   Clinical Narrative:     Patient will DC to: [place] Anticipated DC date: 09/01/2019 Family notified: Benjamine Mola ( mom), [phone] Transport by: Corey Harold   Per MD patient ready for DC today to [place], patient, patient's family, and facility notified of DC. Discharge Summary pending from MD... will need to be reviewed by [facility] prior to d/c.  FL2 sent to facility. RN to call report prior to discharge ([phone]) once d/c summary available. DC packet on chart. Ambulance transport will be requested once facility has read d/c summary and is ready to receive patient.   09/01/2019 @ [place] requested for transportation to [facility]. Pt will go to the [facility], rm 502. Discharge summary faxed to facility. Nurse aware to call and give report.  NCM will sign off for now as intervention is no longer needed. Please consult [place] again if new needs arise.  Final next level of care: Skilled Nursing Facility Barriers to Discharge: No Barriers Identified   Patient Goals and CMS Choice        Discharge Placement                       Discharge Plan and Services   Discharge Planning Services: CM Consult                                 Social Determinants of Health (SDOH) Interventions     Readmission Risk Interventions No flowsheet data found.

## 2019-09-01 NOTE — TOC Progression Note (Signed)
Transition of Care [facility]) - Progression Note    Patient Details  Name: Tommy Mathews MRN: [id number] Date of Birth: 01-11-1961  Transition of Care [facility]) CM/SW [place], LCSW Phone Number: 09/01/2019, 11:46 AM  Clinical Narrative:   Still awaiting response from [facility] on if patient can return today.   Expected Discharge Plan: Skilled Nursing Facility(LTC) Barriers to Discharge: Continued Medical Work up  Expected Discharge Plan and Services Expected Discharge Plan: Skilled Nursing Facility(LTC)   Discharge Planning Services: CM Consult                                           Social Determinants of Health (SDOH) Interventions    Readmission Risk Interventions No flowsheet data found.

## 2019-09-01 NOTE — Progress Notes (Signed)
Patient refused CPT.

## 2019-09-01 NOTE — TOC Progression Note (Addendum)
Transition of Care [facility]) - Progression Note    Patient Details  Name: Tommy Mathews MRN: [id number] Date of Birth: 1960-10-22  Transition of Care [facility]) CM/SW Contact  Sharin Mons, RN Phone Number: [phone] 09/01/2019, 12:52 PM  Clinical Narrative:    NCM received text from [facility] admission liaison. Liaison stated they are ready to receive pt today. MD, nurse and pt  made aware by NCM.   Expected Discharge Plan: Skilled Nursing Facility(LTC) Barriers to Discharge: Continued Medical Work up  Expected Discharge Plan and Services Expected Discharge Plan: Skilled Nursing Facility(LTC)   Discharge Planning Services: CM Consult    Social Determinants of Health (SDOH) Interventions    Readmission Risk Interventions No flowsheet data found.

## 2019-10-29 ENCOUNTER — Other Ambulatory Visit: Payer: Self-pay

## 2019-10-29 ENCOUNTER — Emergency Department (HOSPITAL_COMMUNITY)
Admission: EM | Admit: 2019-10-29 | Discharge: 2019-10-29 | Disposition: A | Discharge status: Home / Self Care | Payer: Medicaid Other | Attending: Emergency Medicine | Admitting: Emergency Medicine

## 2019-10-29 DIAGNOSIS — E119 Type 2 diabetes mellitus without complications: Secondary | ICD-10-CM | POA: Insufficient documentation

## 2019-10-29 DIAGNOSIS — F17210 Nicotine dependence, cigarettes, uncomplicated: Secondary | ICD-10-CM | POA: Insufficient documentation

## 2019-10-29 DIAGNOSIS — J9500 Unspecified tracheostomy complication: Secondary | ICD-10-CM | POA: Diagnosis not present

## 2019-10-29 DIAGNOSIS — I110 Hypertensive heart disease with heart failure: Secondary | ICD-10-CM | POA: Insufficient documentation

## 2019-10-29 DIAGNOSIS — Z430 Encounter for attention to tracheostomy: Secondary | ICD-10-CM | POA: Diagnosis present

## 2019-10-29 DIAGNOSIS — I5022 Chronic systolic (congestive) heart failure: Secondary | ICD-10-CM | POA: Diagnosis not present

## 2019-10-29 DIAGNOSIS — Z79899 Other long term (current) drug therapy: Secondary | ICD-10-CM | POA: Insufficient documentation

## 2019-10-29 DIAGNOSIS — Z794 Long term (current) use of insulin: Secondary | ICD-10-CM | POA: Insufficient documentation

## 2019-10-29 DIAGNOSIS — J449 Chronic obstructive pulmonary disease, unspecified: Secondary | ICD-10-CM | POA: Insufficient documentation

## 2019-10-29 NOTE — ED Triage Notes (Signed)
Pt here from [facility], trach out. No distress, no complaints from patient.

## 2019-10-29 NOTE — ED Notes (Signed)
Ptar Called for transport back to [facility].

## 2019-10-29 NOTE — ED Provider Notes (Signed)
[facility] EMERGENCY DEPARTMENT Provider Note   CSN: [id number] Arrival date & time: 10/29/19  1818     History No chief complaint on file.   Angeldejesus Callaham is a 59 y.o. male.  59 year old male with extensive past medical history below including chronic respiratory failure s/p trach, bipolar d/o, HFrEF, COPD who p/w trach dislodgment.  Patient was sent here by EMS from his nursing facility [facility], where his trach came out.  Is unclear whether it fell out or the patient pulled it out.  When EMS arrived to the patient, he was breathing comfortably in a wheelchair on room air with normal O2 saturations.  He has had no respiratory problems in transport.  The history is provided by the patient and the EMS personnel.       Past Medical History:  Diagnosis Date  . Diabetes (HCC)   . GERD (gastroesophageal reflux disease)   . HLD (hyperlipidemia)   . HTN (hypertension)     Patient Active Problem List   Diagnosis Date Noted  . COPD exacerbation (HCC)   . Sepsis with acute hypoxic respiratory failure without septic shock (HCC)   . Chronic HFrEF (heart failure with reduced ejection fraction) (HCC) 08/24/2019  . Thrombocytopenia (HCC) 08/24/2019  . 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/2018  . Bipolar I disorder, most recent episode depressed (HCC) 03/03/2018  . Schizophrenia (HCC) 02/27/2018  . GI bleed 01/06/2018  . Pneumonia of both lungs due to infectious organism 01/06/2018  . Diabetes (HCC) 01/06/2018    Past Surgical History:  Procedure Laterality Date  . IR GASTROSTOMY TUBE REMOVAL  08/15/2018  . PEG PLACEMENT N/A 01/21/2018   Procedure: PERCUTANEOUS ENDOSCOPIC GASTROSTOMY (PEG) PLACEMENT;  Surgeon: Wyline Mood, MD;  Location: [facility] ENDOSCOPY;  Service: Gastroenterology;  Laterality: N/A;  . TRACHEOSTOMY TUBE PLACEMENT N/A 01/23/2018   Procedure: TRACHEOSTOMY;  Surgeon: Bud Face, MD;  Location: [facility] ORS;   Service: ENT;  Laterality: N/A;       No family history on file.  Social History   Tobacco Use  . Smoking status: Current Every Day Smoker    Packs/day: 1.00    Years: 40.00    Pack years: 40.00    Types: Cigarettes    Start date: 2000  . Smokeless tobacco: Never Used  Substance Use Topics  . Alcohol use: Never  . Drug use: Never    Home Medications Prior to Admission medications   Medication Sig Start Date End Date Taking? Authorizing Provider  acetylcysteine (MUCOMYST) 10% SOLN Take 4 mLs by nebulization every 12 (twelve) hours as needed (secretions). 03/19/18   Shaune Pollack, MD  bisacodyl (DULCOLAX) 10 MG suppository Place 1 suppository (10 mg total) rectally daily as needed for moderate constipation. 03/19/18   Shaune Pollack, MD  budesonide (PULMICORT) 0.5 MG/2ML nebulizer solution Take 2 mLs (0.5 mg total) by nebulization 2 (two) times daily. 03/19/18   Shaune Pollack, MD  clobetasol cream (TEMOVATE) 0.05 % Apply 1 application topically 2 (two) times daily. Apply to back and chest    [provider]  clonazePAM (KLONOPIN) 0.5 MG tablet Take 0.5 tablets (0.25 mg total) by mouth 2 (two) times daily. 03/19/18   Shaune Pollack, MD  digoxin (LANOXIN) 0.25 MG tablet Take 1 tablet (0.25 mg total) by mouth daily. 09/02/19   Elige Radon, MD  divalproex (DEPAKOTE) 500 MG DR tablet Take 500 mg by mouth 2 (two) times daily.    [provider]  docusate sodium (COLACE)  100 MG capsule Take 100 mg by mouth 2 (two) times daily.    [provider]  FLUoxetine (PROZAC) 20 MG capsule Take 1 capsule (20 mg total) by mouth daily. 09/02/19   Mitzi Hansen, MD  furosemide (LASIX) 20 MG tablet Take 1 tablet (20 mg total) by mouth daily. 09/02/19   Mitzi Hansen, MD  gabapentin (NEURONTIN) 100 MG capsule Take 200 mg by mouth every 12 (twelve) hours.    [provider]  guaiFENesin (MUCINEX) 600 MG 12 hr tablet Take 600 mg by mouth 2 (two) times daily.    [provider]  insulin glargine (LANTUS) 100 UNIT/ML injection Inject 0.2 mLs (20 Units total) into the skin at bedtime. Patient taking differently: Inject 10 Units into the skin at bedtime.  03/19/18   Demetrios Loll, MD  ipratropium-albuterol (DUONEB) 0.5-2.5 (3) MG/3ML SOLN Take 3 mLs by nebulization every 4 (four) hours as needed. 03/19/18   Demetrios Loll, MD  ipratropium-albuterol (DUONEB) 0.5-2.5 (3) MG/3ML SOLN Take 3 mLs by nebulization every 4 (four) hours. 03/19/18   Demetrios Loll, MD  ketoconazole (NIZORAL) 2 % cream Apply topically daily as needed for irritation. 03/19/18   Demetrios Loll, MD  lisinopril (ZESTRIL) 10 MG tablet Take 1 tablet (10 mg total) by mouth daily. 09/02/19   Mitzi Hansen, MD  magnesium oxide (MAG-OX) 400 (241.3 Mg) MG tablet Place 1 tablet (400 mg total) into feeding tube 2 (two) times daily. Patient taking differently: Take 400 mg by mouth 2 (two) times daily.  03/19/18   Demetrios Loll, MD  metoprolol tartrate (LOPRESSOR) 25 MG tablet Place 0.5 tablets (12.5 mg total) into feeding tube every 6 (six) hours. Please hold for BP < 130 mmhg Patient taking differently: Take 12.5 mg by mouth every 6 (six) hours. Please hold for BP < 130 mmhg 03/19/18   Demetrios Loll, MD  pantoprazole sodium (PROTONIX) 40 mg/20 mL PACK Place 20 mLs (40 mg total) into feeding tube daily. Patient taking differently: Take 40 mg by mouth daily. 40 mg tablet daily 03/20/18   Demetrios Loll, MD  QUEtiapine (SEROQUEL) 200 MG tablet Take 1 tablet (200 mg total) by mouth at bedtime. 09/01/19   Christian, Rylee, MD  sodium chloride HYPERTONIC 3 % nebulizer solution Take 4 mLs by nebulization daily. 03/20/18   Demetrios Loll, MD  triamcinolone cream (KENALOG) 0.5 % Apply 1 application topically 2 (two) times daily. Apply to facial rash    [provider]    Allergies    Patient has no known allergies.  Review of Systems   Review of Systems All other systems reviewed and are negative except that which was mentioned in  HPI  Physical Exam Updated Vital Signs BP 124/81   Pulse 72   Temp 98.5 F (36.9 C) (Oral)   Resp 16   SpO2 (!) 89%   Physical Exam Vitals and nursing note reviewed.  Constitutional:      General: He is not in acute distress.    Appearance: Normal appearance. He is well-developed.  HENT:     Head: Normocephalic and atraumatic.  Eyes:     Conjunctiva/sclera: Conjunctivae normal.  Pulmonary:     Effort: Pulmonary effort is normal.     Breath sounds: Normal breath sounds.     Comments: Mature trach stoma in place, no bleeding or drainage Musculoskeletal:     Cervical back: Neck supple.  Skin:    General: Skin is warm and dry.  Neurological:     Mental  Status: He is alert and oriented to person, place, and time.  Psychiatric:        Judgment: Judgment normal.     ED Results / Procedures / Treatments   Labs (all labs ordered are listed, but only abnormal results are displayed) Labs Reviewed - No data to display  EKG None  Radiology No results found.  Procedures Procedures (including critical care time)  Medications Ordered in ED Medications - No data to display  ED Course  I have reviewed the triage vital signs and the nursing notes.     MDM Rules/Calculators/A&P                      PT comfortable on RA, O2 sat 91-94%. No respiratory distress.  I could not find any recent documentation of his trach size; he had an 8 Shiley cuffed documented in 2019 which we tried but it was too large.  Successfully replaced a 6 cuffed Shiley.  Patient observed afterwards and he has remained comfortable with no complaints.  He feels well on reassessment.  Discharged back to nursing facility. Final Clinical Impression(s) / ED Diagnoses Final diagnoses:  Tracheostomy complication, unspecified complication type [facility])    Rx / DC Orders ED Discharge Orders    None       Jezreel Sisk, Ambrose Finland, MD 10/29/19 1909

## 2019-10-29 NOTE — ED Provider Notes (Signed)
TRACHEOSTOMY REPLACEMENT  Date/Time: 10/29/2019 7:05 PM Performed by: Maxwell Caul, PA-C Authorized by: Maxwell Caul, PA-C  Consent: Verbal consent obtained. Consent given by: patient Patient understanding: patient states understanding of the procedure being performed Patient consent: the patient's understanding of the procedure matches consent given Procedure consent: procedure consent matches procedure scheduled Relevant documents: relevant documents present and verified Test results: test results not available Site marked: the operative site was marked Imaging studies: imaging studies not available Patient identity confirmed: verbally with patient Indications: became dislodged Local anesthesia used: no  Anesthesia: Local anesthesia used: no Tube type: Shiley  Tube cuff: single cuff Tube size: 6.0 mm Cuff inflation: deflated Cuff type: air Patient tolerance: patient tolerated the procedure well with no immediate complications    Portions of this note were generated with Scientist, clinical (histocompatibility and immunogenetics). Dictation errors may occur despite best attempts at proofreading.     Wassim, Kirksey, PA-C 10/29/19 1908    Little, Ambrose Finland, MD 10/30/19 1810

## 2019-12-14 ENCOUNTER — Encounter (HOSPITAL_COMMUNITY): Payer: Self-pay | Admitting: Emergency Medicine

## 2019-12-14 ENCOUNTER — Other Ambulatory Visit: Payer: Self-pay

## 2019-12-14 ENCOUNTER — Inpatient Hospital Stay (HOSPITAL_COMMUNITY)
Admission: EM | Admit: 2019-12-14 | Discharge: 2019-12-20 | DRG: 291 | Disposition: A | Discharge status: Skilled Nursing Facility | Payer: Medicaid Other | Source: Skilled Nursing Facility | Attending: Student | Admitting: Student

## 2019-12-14 ENCOUNTER — Emergency Department (HOSPITAL_COMMUNITY): Payer: Medicaid Other

## 2019-12-14 ENCOUNTER — Inpatient Hospital Stay (HOSPITAL_COMMUNITY): Payer: Medicaid Other

## 2019-12-14 DIAGNOSIS — I110 Hypertensive heart disease with heart failure: Principal | ICD-10-CM | POA: Diagnosis present

## 2019-12-14 DIAGNOSIS — F329 Major depressive disorder, single episode, unspecified: Secondary | ICD-10-CM | POA: Diagnosis not present

## 2019-12-14 DIAGNOSIS — F319 Bipolar disorder, unspecified: Secondary | ICD-10-CM | POA: Diagnosis present

## 2019-12-14 DIAGNOSIS — I5023 Acute on chronic systolic (congestive) heart failure: Secondary | ICD-10-CM | POA: Diagnosis present

## 2019-12-14 DIAGNOSIS — Z79899 Other long term (current) drug therapy: Secondary | ICD-10-CM

## 2019-12-14 DIAGNOSIS — I509 Heart failure, unspecified: Secondary | ICD-10-CM

## 2019-12-14 DIAGNOSIS — Z794 Long term (current) use of insulin: Secondary | ICD-10-CM

## 2019-12-14 DIAGNOSIS — J449 Chronic obstructive pulmonary disease, unspecified: Secondary | ICD-10-CM | POA: Diagnosis present

## 2019-12-14 DIAGNOSIS — E1165 Type 2 diabetes mellitus with hyperglycemia: Secondary | ICD-10-CM | POA: Diagnosis present

## 2019-12-14 DIAGNOSIS — J9811 Atelectasis: Secondary | ICD-10-CM | POA: Diagnosis present

## 2019-12-14 DIAGNOSIS — F3130 Bipolar disorder, current episode depressed, mild or moderate severity, unspecified: Secondary | ICD-10-CM | POA: Diagnosis not present

## 2019-12-14 DIAGNOSIS — Z7951 Long term (current) use of inhaled steroids: Secondary | ICD-10-CM | POA: Diagnosis not present

## 2019-12-14 DIAGNOSIS — I480 Paroxysmal atrial fibrillation: Secondary | ICD-10-CM | POA: Diagnosis present

## 2019-12-14 DIAGNOSIS — Z20822 Contact with and (suspected) exposure to covid-19: Secondary | ICD-10-CM | POA: Diagnosis present

## 2019-12-14 DIAGNOSIS — Z87820 Personal history of traumatic brain injury: Secondary | ICD-10-CM | POA: Diagnosis not present

## 2019-12-14 DIAGNOSIS — K219 Gastro-esophageal reflux disease without esophagitis: Secondary | ICD-10-CM | POA: Diagnosis present

## 2019-12-14 DIAGNOSIS — I10 Essential (primary) hypertension: Secondary | ICD-10-CM | POA: Diagnosis not present

## 2019-12-14 DIAGNOSIS — J9601 Acute respiratory failure with hypoxia: Secondary | ICD-10-CM | POA: Diagnosis not present

## 2019-12-14 DIAGNOSIS — E119 Type 2 diabetes mellitus without complications: Secondary | ICD-10-CM | POA: Diagnosis not present

## 2019-12-14 DIAGNOSIS — I5021 Acute systolic (congestive) heart failure: Secondary | ICD-10-CM

## 2019-12-14 DIAGNOSIS — F209 Schizophrenia, unspecified: Secondary | ICD-10-CM | POA: Diagnosis present

## 2019-12-14 DIAGNOSIS — B372 Candidiasis of skin and nail: Secondary | ICD-10-CM | POA: Diagnosis not present

## 2019-12-14 DIAGNOSIS — R1310 Dysphagia, unspecified: Secondary | ICD-10-CM | POA: Diagnosis present

## 2019-12-14 DIAGNOSIS — I5033 Acute on chronic diastolic (congestive) heart failure: Secondary | ICD-10-CM | POA: Diagnosis present

## 2019-12-14 DIAGNOSIS — Z930 Tracheostomy status: Secondary | ICD-10-CM

## 2019-12-14 DIAGNOSIS — F0390 Unspecified dementia without behavioral disturbance: Secondary | ICD-10-CM | POA: Diagnosis present

## 2019-12-14 DIAGNOSIS — E785 Hyperlipidemia, unspecified: Secondary | ICD-10-CM | POA: Diagnosis present

## 2019-12-14 DIAGNOSIS — J9621 Acute and chronic respiratory failure with hypoxia: Secondary | ICD-10-CM | POA: Diagnosis present

## 2019-12-14 DIAGNOSIS — F17210 Nicotine dependence, cigarettes, uncomplicated: Secondary | ICD-10-CM | POA: Diagnosis present

## 2019-12-14 DIAGNOSIS — J189 Pneumonia, unspecified organism: Secondary | ICD-10-CM

## 2019-12-14 DIAGNOSIS — R0902 Hypoxemia: Secondary | ICD-10-CM | POA: Diagnosis present

## 2019-12-14 DIAGNOSIS — F419 Anxiety disorder, unspecified: Secondary | ICD-10-CM | POA: Diagnosis not present

## 2019-12-14 LAB — TROPONIN I (HIGH SENSITIVITY): Troponin I (High Sensitivity): 19 ng/L — ABNORMAL HIGH (ref <18)

## 2019-12-14 LAB — COMPREHENSIVE METABOLIC PANEL
ALT: 9 U/L (ref 0–44)
AST: 14 U/L — ABNORMAL LOW (ref 15–41)
Albumin: 3 g/dL — ABNORMAL LOW (ref 3.5–5.0)
Alkaline Phosphatase: 54 U/L (ref 38–126)
Anion gap: 10 (ref 5–15)
BUN: 12 mg/dL (ref 6–20)
CO2: 28 mmol/L (ref 22–32)
Calcium: 8.8 mg/dL — ABNORMAL LOW (ref 8.9–10.3)
Chloride: 101 mmol/L (ref 98–111)
Creatinine, Ser: 1.12 mg/dL (ref 0.61–1.24)
GFR calc Af Amer: >60 mL/min (ref >60)
GFR calc non Af Amer: >60 mL/min (ref >60)
Glucose, Bld: 96 mg/dL (ref 70–99)
Potassium: 4.1 mmol/L (ref 3.5–5.1)
Sodium: 139 mmol/L (ref 135–145)
Total Bilirubin: 0.5 mg/dL (ref 0.3–1.2)
Total Protein: 6.7 g/dL (ref 6.5–8.1)

## 2019-12-14 LAB — CBC WITH DIFFERENTIAL/PLATELET
Abs Immature Granulocytes: 0.15 10*3/uL — ABNORMAL HIGH (ref 0.00–0.07)
Basophils Absolute: 0.1 10*3/uL (ref 0.0–0.1)
Basophils Relative: 1 %
Eosinophils Absolute: 0.1 10*3/uL (ref 0.0–0.5)
Eosinophils Relative: 1 %
HCT: 49.8 % (ref 39.0–52.0)
Hemoglobin: 14.9 g/dL (ref 13.0–17.0)
Immature Granulocytes: 2 %
Lymphocytes Relative: 30 %
Lymphs Abs: 3.1 10*3/uL (ref 0.7–4.0)
MCH: 27.1 pg (ref 26.0–34.0)
MCHC: 29.9 g/dL — ABNORMAL LOW (ref 30.0–36.0)
MCV: 90.7 fL (ref 80.0–100.0)
Monocytes Absolute: 1.2 10*3/uL — ABNORMAL HIGH (ref 0.1–1.0)
Monocytes Relative: 11 %
Neutro Abs: 5.8 10*3/uL (ref 1.7–7.7)
Neutrophils Relative %: 55 %
Platelets: 221 10*3/uL (ref 150–400)
RBC: 5.49 MIL/uL (ref 4.22–5.81)
RDW: 15.8 % — ABNORMAL HIGH (ref 11.5–15.5)
WBC: 10.3 10*3/uL (ref 4.0–10.5)
nRBC: 0.7 % — ABNORMAL HIGH (ref 0.0–0.2)

## 2019-12-14 LAB — URINALYSIS, ROUTINE W REFLEX MICROSCOPIC
Bacteria, UA: NONE SEEN
Glucose, UA: NEGATIVE mg/dL
Hgb urine dipstick: NEGATIVE
Ketones, ur: 5 mg/dL — AB
Nitrite: NEGATIVE
Protein, ur: 30 mg/dL — AB
Specific Gravity, Urine: 1.029 (ref 1.005–1.030)
pH: 5 (ref 5.0–8.0)

## 2019-12-14 LAB — ECHOCARDIOGRAM COMPLETE
Height: 75 in
Weight: 3721.36 oz

## 2019-12-14 LAB — LACTIC ACID, PLASMA
Lactic Acid, Venous: 0.8 mmol/L (ref 0.5–1.9)
Lactic Acid, Venous: 1 mmol/L (ref 0.5–1.9)

## 2019-12-14 LAB — POCT I-STAT 7, (LYTES, BLD GAS, ICA,H+H)
Acid-Base Excess: 4 mmol/L — ABNORMAL HIGH (ref 0.0–2.0)
Bicarbonate: 30.9 mmol/L — ABNORMAL HIGH (ref 20.0–28.0)
Calcium, Ion: 1.22 mmol/L (ref 1.15–1.40)
HCT: 45 % (ref 39.0–52.0)
Hemoglobin: 15.3 g/dL (ref 13.0–17.0)
O2 Saturation: 82 %
Patient temperature: 97.8
Potassium: 3.7 mmol/L (ref 3.5–5.1)
Sodium: 140 mmol/L (ref 135–145)
TCO2: 32 mmol/L (ref 22–32)
pCO2 arterial: 50.4 mmHg — ABNORMAL HIGH (ref 32.0–48.0)
pH, Arterial: 7.394 (ref 7.350–7.450)
pO2, Arterial: 46 mmHg — ABNORMAL LOW (ref 83.0–108.0)

## 2019-12-14 LAB — CBG MONITORING, ED
Glucose-Capillary: 91 mg/dL (ref 70–99)
Glucose-Capillary: 74 mg/dL (ref 70–99)
Glucose-Capillary: 111 mg/dL — ABNORMAL HIGH (ref 70–99)
Glucose-Capillary: 120 mg/dL — ABNORMAL HIGH (ref 70–99)
Glucose-Capillary: 110 mg/dL — ABNORMAL HIGH (ref 70–99)

## 2019-12-14 LAB — PROTIME-INR
INR: 1 (ref 0.8–1.2)
Prothrombin Time: 12.9 seconds (ref 11.4–15.2)

## 2019-12-14 LAB — SARS CORONAVIRUS 2 (TAT 6-24 HRS): SARS Coronavirus 2: NEGATIVE

## 2019-12-14 LAB — APTT: aPTT: 36 seconds (ref 24–36)

## 2019-12-14 LAB — POC SARS CORONAVIRUS 2 AG -  ED: SARS Coronavirus 2 Ag: NEGATIVE

## 2019-12-14 LAB — BRAIN NATRIURETIC PEPTIDE: B Natriuretic Peptide: 877.2 pg/mL — ABNORMAL HIGH (ref 0.0–100.0)

## 2019-12-14 MED ORDER — FUROSEMIDE 10 MG/ML IJ SOLN
40.0000 mg | Freq: Two times a day (BID) | INTRAMUSCULAR | Status: DC
  Administered 2019-12-14 – 2019-12-17 (×6): 40 mg via INTRAVENOUS
  Filled 2019-12-14 (×6): qty 4

## 2019-12-14 MED ORDER — BUDESONIDE 0.5 MG/2ML IN SUSP
0.5000 mg | Freq: Two times a day (BID) | RESPIRATORY_TRACT | Status: DC
  Administered 2019-12-15 – 2019-12-20 (×11): 0.5 mg via RESPIRATORY_TRACT
  Filled 2019-12-14 (×12): qty 2

## 2019-12-14 MED ORDER — ALBUTEROL SULFATE (2.5 MG/3ML) 0.083% IN NEBU
5.0000 mg | INHALATION_SOLUTION | Freq: Once | RESPIRATORY_TRACT | Status: DC
  Filled 2019-12-14: qty 6

## 2019-12-14 MED ORDER — IPRATROPIUM-ALBUTEROL 0.5-2.5 (3) MG/3ML IN SOLN: 3.0000 mL | RESPIRATORY_TRACT | Status: DC | PRN

## 2019-12-14 MED ORDER — ACETAMINOPHEN 325 MG PO TABS: 650.0000 mg | ORAL_TABLET | ORAL | Status: DC | PRN

## 2019-12-14 MED ORDER — GABAPENTIN 100 MG PO CAPS
200.0000 mg | ORAL_CAPSULE | Freq: Two times a day (BID) | ORAL | Status: DC
  Administered 2019-12-14 – 2019-12-20 (×13): 200 mg via ORAL
  Filled 2019-12-14 (×13): qty 2

## 2019-12-14 MED ORDER — INSULIN GLARGINE 100 UNIT/ML ~~LOC~~ SOLN
5.0000 [IU] | Freq: Every day | SUBCUTANEOUS | Status: DC
  Administered 2019-12-14 – 2019-12-19 (×6): 5 [IU] via SUBCUTANEOUS
  Filled 2019-12-14 (×7): qty 0.05

## 2019-12-14 MED ORDER — GUAIFENESIN ER 600 MG PO TB12
600.0000 mg | ORAL_TABLET | Freq: Two times a day (BID) | ORAL | Status: DC
  Administered 2019-12-14 – 2019-12-20 (×13): 600 mg via ORAL
  Filled 2019-12-14 (×13): qty 1

## 2019-12-14 MED ORDER — ONDANSETRON HCL 4 MG/2ML IJ SOLN: 4.0000 mg | Freq: Four times a day (QID) | INTRAMUSCULAR | Status: DC | PRN

## 2019-12-14 MED ORDER — DIVALPROEX SODIUM 500 MG PO DR TAB
500.0000 mg | DELAYED_RELEASE_TABLET | Freq: Two times a day (BID) | ORAL | Status: DC
  Administered 2019-12-14 – 2019-12-20 (×13): 500 mg via ORAL
  Filled 2019-12-14 (×13): qty 1
  Filled 2019-12-14 (×2): qty 2

## 2019-12-14 MED ORDER — SODIUM CHLORIDE 0.9% FLUSH
3.0000 mL | Freq: Two times a day (BID) | INTRAVENOUS | Status: DC
  Administered 2019-12-14 – 2019-12-20 (×12): 3 mL via INTRAVENOUS

## 2019-12-14 MED ORDER — SODIUM CHLORIDE 3 % IN NEBU
4.0000 mL | INHALATION_SOLUTION | Freq: Every day | RESPIRATORY_TRACT | Status: AC
  Administered 2019-12-15 – 2019-12-16 (×2): 4 mL via RESPIRATORY_TRACT
  Filled 2019-12-14 (×4): qty 4

## 2019-12-14 MED ORDER — DIGOXIN 125 MCG PO TABS
0.2500 mg | ORAL_TABLET | Freq: Every day | ORAL | Status: DC
  Administered 2019-12-14 – 2019-12-16 (×3): 0.25 mg via ORAL
  Filled 2019-12-14 (×3): qty 2

## 2019-12-14 MED ORDER — FUROSEMIDE 10 MG/ML IJ SOLN
40.0000 mg | Freq: Once | INTRAMUSCULAR | Status: AC
  Administered 2019-12-14: 40 mg via INTRAVENOUS
  Filled 2019-12-14: qty 4

## 2019-12-14 MED ORDER — SODIUM CHLORIDE 0.9% FLUSH: 3.0000 mL | INTRAVENOUS | Status: DC | PRN

## 2019-12-14 MED ORDER — MAGNESIUM OXIDE 400 (241.3 MG) MG PO TABS
400.0000 mg | ORAL_TABLET | Freq: Two times a day (BID) | ORAL | Status: DC
  Administered 2019-12-14 – 2019-12-20 (×13): 400 mg via ORAL
  Filled 2019-12-14 (×13): qty 1

## 2019-12-14 MED ORDER — IPRATROPIUM BROMIDE 0.02 % IN SOLN
0.5000 mg | Freq: Once | RESPIRATORY_TRACT | Status: DC
  Filled 2019-12-14: qty 2.5

## 2019-12-14 MED ORDER — QUETIAPINE FUMARATE 100 MG PO TABS
200.0000 mg | ORAL_TABLET | Freq: Every day | ORAL | Status: DC
  Administered 2019-12-14 – 2019-12-19 (×6): 200 mg via ORAL
  Filled 2019-12-14: qty 2
  Filled 2019-12-14: qty 1
  Filled 2019-12-14 (×4): qty 2

## 2019-12-14 MED ORDER — METOPROLOL TARTRATE 25 MG PO TABS
25.0000 mg | ORAL_TABLET | Freq: Two times a day (BID) | ORAL | Status: DC
  Administered 2019-12-14 – 2019-12-16 (×5): 25 mg via ORAL
  Filled 2019-12-14 (×5): qty 1

## 2019-12-14 MED ORDER — FLUOXETINE HCL 20 MG PO CAPS
20.0000 mg | ORAL_CAPSULE | Freq: Every day | ORAL | Status: DC
  Administered 2019-12-14 – 2019-12-20 (×7): 20 mg via ORAL
  Filled 2019-12-14 (×7): qty 1

## 2019-12-14 MED ORDER — LISINOPRIL 10 MG PO TABS
10.0000 mg | ORAL_TABLET | Freq: Every day | ORAL | Status: DC
  Administered 2019-12-14 – 2019-12-16 (×3): 10 mg via ORAL
  Filled 2019-12-14 (×3): qty 1

## 2019-12-14 MED ORDER — INSULIN ASPART 100 UNIT/ML ~~LOC~~ SOLN
0.0000 [IU] | Freq: Three times a day (TID) | SUBCUTANEOUS | Status: DC
  Administered 2019-12-15 – 2019-12-16 (×2): 1 [IU] via SUBCUTANEOUS
  Administered 2019-12-17: 2 [IU] via SUBCUTANEOUS
  Administered 2019-12-18: 1 [IU] via SUBCUTANEOUS
  Administered 2019-12-19: 2 [IU] via SUBCUTANEOUS
  Administered 2019-12-19: 1 [IU] via SUBCUTANEOUS
  Administered 2019-12-20: 2 [IU] via SUBCUTANEOUS

## 2019-12-14 MED ORDER — ACETYLCYSTEINE 20 % IN SOLN: 4.0000 mL | Freq: Two times a day (BID) | RESPIRATORY_TRACT | Status: DC | PRN

## 2019-12-14 MED ORDER — PANTOPRAZOLE SODIUM 40 MG PO TBEC
40.0000 mg | DELAYED_RELEASE_TABLET | Freq: Every day | ORAL | Status: DC
  Administered 2019-12-14 – 2019-12-20 (×7): 40 mg via ORAL
  Filled 2019-12-14 (×7): qty 1

## 2019-12-14 MED ORDER — CLONAZEPAM 0.25 MG PO TBDP
0.2500 mg | ORAL_TABLET | Freq: Two times a day (BID) | ORAL | Status: DC
  Administered 2019-12-14 – 2019-12-20 (×13): 0.25 mg via ORAL
  Filled 2019-12-14 (×6): qty 1
  Filled 2019-12-14: qty 2
  Filled 2019-12-14 (×5): qty 1
  Filled 2019-12-14 (×2): qty 2

## 2019-12-14 MED ORDER — SODIUM CHLORIDE 0.9 % IV SOLN: 250.0000 mL | INTRAVENOUS | Status: DC | PRN

## 2019-12-14 MED ORDER — ENOXAPARIN SODIUM 40 MG/0.4ML ~~LOC~~ SOLN
40.0000 mg | Freq: Every day | SUBCUTANEOUS | Status: DC
  Administered 2019-12-14 – 2019-12-15 (×2): 40 mg via SUBCUTANEOUS
  Filled 2019-12-14 (×2): qty 0.4

## 2019-12-14 MED ORDER — FLUCONAZOLE 100 MG PO TABS
100.0000 mg | ORAL_TABLET | Freq: Every day | ORAL | Status: DC
  Administered 2019-12-15 – 2019-12-20 (×6): 100 mg via ORAL
  Filled 2019-12-14 (×7): qty 1

## 2019-12-14 NOTE — Progress Notes (Signed)
  Echocardiogram 2D Echocardiogram has been performed.  Tommy Mathews 12/14/2019, 3:33 PM

## 2019-12-14 NOTE — ED Notes (Signed)
Report attempt to 2C unsuccessful. Will call back.

## 2019-12-14 NOTE — ED Notes (Signed)
Brother Mariana Kaufman and Mother would like an update. [phone]

## 2019-12-14 NOTE — ED Notes (Signed)
Dr Cardama informed POC covid test neg 

## 2019-12-14 NOTE — ED Notes (Signed)
Pt taken of o2 for evaluation. SPo2 dropped to 88%. Pt replaced onto 2L. Sat returned to 95%

## 2019-12-14 NOTE — ED Notes (Signed)
Help patient with his lunch tray

## 2019-12-14 NOTE — H&P (Signed)
History and Physical    Jacody Beneke OAC:[id number] DOB: 1961/08/26 DOA: 12/14/2019  PCP: Georgann Housekeeper, MD   Patient coming from: SNF   Chief Complaint: SOB, low oxygen saturation   HPI: Tommy Mathews is a 59 y.o. male with medical history significant for tracheostomy, chronic systolic CHF, paroxysmal atrial fibrillation not anticoagulated due to history of GI bleeding, COPD, bipolar disorder, and insulin-dependent diabetes mellitus, now presenting to the emergency department for evaluation of shortness of breath and hypoxia.  Patient reports that he began to feel more short of breath yesterday, has had a mild nonproductive cough, but denies any fevers, chills, or chest pain.  He has not noticed any leg swelling but notes that he has not checked.  He has not noticed any orthopnea.  Reports that his shortness of breath worsened overnight, he was found to have oxygen saturations in the 60s, improved to mid 70s after suction and breathing treatment, and then up to the 90s when placed on 5 L/min of supplemental oxygen by EMS.  ED Course: Upon arrival to the ED, patient is found to be afebrile, saturating mid 80s on room air, and with stable blood pressure.  EKG features a sinus rhythm and chest x-ray notable for bibasilar opacities and small pleural effusions concerning for acute CHF.  Chemistry panel and CBC are unremarkable and BNP is elevated to 877.  Patient was given 40 mg IV Lasix in the ED and COVID-19 PCR screening test is in process.  Review of Systems:  All other systems reviewed and apart from HPI, are negative.  Past Medical History:  Diagnosis Date  . Diabetes (HCC)   . GERD (gastroesophageal reflux disease)   . HLD (hyperlipidemia)   . HTN (hypertension)     Past Surgical History:  Procedure Laterality Date  . IR GASTROSTOMY TUBE REMOVAL  08/15/2018  . PEG PLACEMENT N/A 01/21/2018   Procedure: PERCUTANEOUS ENDOSCOPIC GASTROSTOMY (PEG) PLACEMENT;  Surgeon: Wyline Mood, MD;   Location: [facility] ENDOSCOPY;  Service: Gastroenterology;  Laterality: N/A;  . TRACHEOSTOMY TUBE PLACEMENT N/A 01/23/2018   Procedure: TRACHEOSTOMY;  Surgeon: Bud Face, MD;  Location: [facility] ORS;  Service: ENT;  Laterality: N/A;     reports that he has been smoking cigarettes. He started smoking about 21 years ago. He has a 40.00 pack-year smoking history. He has never used smokeless tobacco. He reports that he does not drink alcohol or use drugs.  No Known Allergies  History reviewed. No pertinent family history.   Prior to Admission medications   Medication Sig Start Date End Date Taking? Authorizing Provider  acetylcysteine (MUCOMYST) 10% SOLN Take 4 mLs by nebulization every 12 (twelve) hours as needed (secretions). 03/19/18  Yes Shaune Pollack, MD  bisacodyl (DULCOLAX) 10 MG suppository Place 1 suppository (10 mg total) rectally daily as needed for moderate constipation. 03/19/18  Yes Shaune Pollack, MD  budesonide (PULMICORT) 0.5 MG/2ML nebulizer solution Take 2 mLs (0.5 mg total) by nebulization 2 (two) times daily. 03/19/18  Yes Shaune Pollack, MD  clonazePAM (KLONOPIN) 0.5 MG tablet Take 0.5 tablets (0.25 mg total) by mouth 2 (two) times daily. 03/19/18  Yes Shaune Pollack, MD  digoxin (LANOXIN) 0.25 MG tablet Take 1 tablet (0.25 mg total) by mouth daily. 09/02/19  Yes Christian, Rylee, MD  divalproex (DEPAKOTE) 500 MG DR tablet Take 500 mg by mouth 2 (two) times daily.   Yes [provider]  docusate sodium (COLACE) 100 MG capsule Take 100 mg by mouth 2 (two) times daily.  Yes [provider]  FLUoxetine (PROZAC) 20 MG capsule Take 1 capsule (20 mg total) by mouth daily. 09/02/19  Yes Christian, Rylee, MD  furosemide (LASIX) 20 MG tablet Take 1 tablet (20 mg total) by mouth daily. 09/02/19  Yes Christian, Rylee, MD  gabapentin (NEURONTIN) 100 MG capsule Take 200 mg by mouth every 12 (twelve) hours.   Yes [provider]  guaiFENesin (MUCINEX) 600 MG 12 hr tablet Take 600 mg  by mouth 2 (two) times daily.   Yes [provider]  insulin glargine (LANTUS) 100 UNIT/ML injection Inject 0.2 mLs (20 Units total) into the skin at bedtime. 03/19/18  Yes Shaune Pollack, MD  ipratropium-albuterol (DUONEB) 0.5-2.5 (3) MG/3ML SOLN Take 3 mLs by nebulization every 4 (four) hours as needed. 03/19/18  Yes Shaune Pollack, MD  ipratropium-albuterol (DUONEB) 0.5-2.5 (3) MG/3ML SOLN Take 3 mLs by nebulization every 4 (four) hours. 03/19/18  Yes Shaune Pollack, MD  lisinopril (ZESTRIL) 10 MG tablet Take 1 tablet (10 mg total) by mouth daily. 09/02/19  Yes Christian, Rylee, MD  magnesium oxide (MAG-OX) 400 (241.3 Mg) MG tablet Place 1 tablet (400 mg total) into feeding tube 2 (two) times daily. Patient taking differently: Take 400 mg by mouth 2 (two) times daily.  03/19/18  Yes Shaune Pollack, MD  metoprolol tartrate (LOPRESSOR) 25 MG tablet Place 0.5 tablets (12.5 mg total) into feeding tube every 6 (six) hours. Please hold for BP < 130 mmhg Patient taking differently: Take 12.5 mg by mouth every 6 (six) hours. Please hold for BP < 130 mmhg 03/19/18  Yes Shaune Pollack, MD  pantoprazole sodium (PROTONIX) 40 mg/20 mL PACK Place 20 mLs (40 mg total) into feeding tube daily. Patient taking differently: Take 40 mg by mouth daily. 40 mg tablet daily 03/20/18  Yes Shaune Pollack, MD  QUEtiapine (SEROQUEL) 200 MG tablet Take 1 tablet (200 mg total) by mouth at bedtime. 09/01/19  Yes Christian, Rylee, MD  sodium chloride HYPERTONIC 3 % nebulizer solution Take 4 mLs by nebulization daily. 03/20/18  Yes Shaune Pollack, MD    Physical Exam: Vitals:   12/14/19 0250 12/14/19 0408 12/14/19 0429 12/14/19 0500  BP:    111/64  Pulse:  75  72  Resp:  18  20  Temp:   97.7 F (36.5 C)   TempSrc:   Rectal   SpO2: 95% 100%  96%  Weight:      Height:        Constitutional: NAD, calm  Eyes: PERTLA, lids and conjunctivae normal ENMT: Mucous membranes are moist. Posterior pharynx clear of any exudate or lesions.   Neck: normal,  supple, no masses, no thyromegaly Respiratory:  no wheezing, diminished bilaterally. No accessory muscle use.  Cardiovascular: S1 & S2 heard, regular rate and rhythm. Pretibial pitting edema. Abdomen: No distension, no tenderness, soft. Bowel sounds active.  Musculoskeletal: no clubbing / cyanosis. No joint deformity upper and lower extremities.   Skin: no significant rashes, lesions, ulcers. Warm, dry, well-perfused. Neurologic: No gross facial asymmetry. Sensation intact. Moving all extremities.  Psychiatric: Alert and oriented to person, place, and situation. Pleasant and cooperative.     Labs and Imaging on Admission: I have personally reviewed following labs and imaging studies  CBC: Recent Labs  Lab 12/14/19 0240 12/14/19 0327  WBC 10.3  --   NEUTROABS 5.8  --   HGB 14.9 15.3  HCT 49.8 45.0  MCV 90.7  --   PLT 221  --    Basic Metabolic  Panel: Recent Labs  Lab 12/14/19 0240 12/14/19 0327  NA 139 140  K 4.1 3.7  CL 101  --   CO2 28  --   GLUCOSE 96  --   BUN 12  --   CREATININE 1.12  --   CALCIUM 8.8*  --    GFR: Estimated Creatinine Clearance: 95.6 mL/min (by C-G formula based on SCr of 1.12 mg/dL). Liver Function Tests: Recent Labs  Lab 12/14/19 0240  AST 14*  ALT 9  ALKPHOS 54  BILITOT 0.5  PROT 6.7  ALBUMIN 3.0*   No results for input(s): LIPASE, AMYLASE in the last 168 hours. No results for input(s): AMMONIA in the last 168 hours. Coagulation Profile: Recent Labs  Lab 12/14/19 0240  INR 1.0   Cardiac Enzymes: No results for input(s): CKTOTAL, CKMB, CKMBINDEX, TROPONINI in the last 168 hours. BNP (last 3 results) No results for input(s): PROBNP in the last 8760 hours. HbA1C: No results for input(s): HGBA1C in the last 72 hours. CBG: No results for input(s): GLUCAP in the last 168 hours. Lipid Profile: No results for input(s): CHOL, HDL, LDLCALC, TRIG, CHOLHDL, LDLDIRECT in the last 72 hours. Thyroid Function Tests: No results for  input(s): TSH, T4TOTAL, FREET4, T3FREE, THYROIDAB in the last 72 hours. Anemia Panel: No results for input(s): VITAMINB12, FOLATE, FERRITIN, TIBC, IRON, RETICCTPCT in the last 72 hours. Urine analysis:    Component Value Date/Time   COLORURINE YELLOW 12/14/2019 0430   APPEARANCEUR CLEAR 12/14/2019 0430   LABSPEC 1.029 12/14/2019 0430   PHURINE 5.0 12/14/2019 0430   GLUCOSEU NEGATIVE 12/14/2019 0430   HGBUR NEGATIVE 12/14/2019 0430   BILIRUBINUR SMALL (A) 12/14/2019 0430   KETONESUR 5 (A) 12/14/2019 0430   PROTEINUR 30 (A) 12/14/2019 0430   NITRITE NEGATIVE 12/14/2019 0430   LEUKOCYTESUR TRACE (A) 12/14/2019 0430   Sepsis Labs: @LABRCNTIP (procalcitonin:4,lacticidven:4) )No results found for this or any previous visit (from the past 240 hour(s)).   Radiological Exams on Admission: DG Chest Port 1 View  Result Date: 12/14/2019 CLINICAL DATA:  Hypoxia EXAM: PORTABLE CHEST 1 VIEW COMPARISON:  08/24/2019 FINDINGS: Unchanged position of tracheostomy tube with tip at the level of the clavicular heads. Bilateral basilar predominant airspace opacities. Small pleural effusions. IMPRESSION: Bibasilar predominant opacities, likely pulmonary edema. Small pleural effusions. Electronically Signed   By: Ulyses Jarred M.D.   On: 12/14/2019 03:19    EKG: Independently reviewed. Sinus rhythm, non-specific ST-T abnormality appears similar to prior.   Assessment/Plan   1. Acute on chronic systolic CHF; acute hypoxic respiratory failure  - Presents from SNF with SOB and hypoxia, found to have peripheral edema, CXR findings consistent with acute CHF, and elevated BNP (previously normal) - He had echo in April 2019 with EF 20-25%, diffuse hypokinesis, mild MR, mild elevation in PA pressures  - Started on supplemental O2 with EMS and treated with Lasix 40 mg IV in ED  - Continue diuresis with IV Lasix, update echo, continue digoxin, continue ACE-i, consider changing beta-blocker to Coreg or metoprolol  succinate, follow daily wt and I/Os, monitor electrolytes, continue supplemental O2 as needed    2. Insulin-dependent DM  - A1c was 7.7% in November  - Continue CBG checks and insulin    3. Bipolar disorder  - Calm and cooperative in ED  - Continue Depakote, Prozac, Seroquel, Klonopin     4. Paroxysmal atrial fibrillation  - In sinus rhythm on admission  - CHADS-VASc is at least 2 (CHF, DM), not anticoagulated due to  hx of GIB   5. COPD  - No wheezing, patient denies recent sputum production  - Continue nebs     - Continue trach care    DVT prophylaxis: Lovenox  Code Status: Full  Family Communication: Discussed with patient  Disposition Plan: Likely back to SNF in 3-4 days pending improvement in respiratory status  Consults called: None  Admission status: Inpatient     Briscoe Deutscher, MD [facility] Pager: See [email or web address]  If 7AM-7PM, please contact the daytime attending [email or web address]  12/14/2019, 5:58 AM

## 2019-12-14 NOTE — ED Provider Notes (Signed)
[facility] EMERGENCY DEPARTMENT Provider Note   CSN: [id number] Arrival date & time: 12/14/19  8101     History Chief Complaint  Patient presents with  . Shortness of Breath    Tommy Mathews is a 59 y.o. male with a hx of COPD, chronic heart failure, schizophrenia, dementia, TBI, trach in place presents to the Emergency Department via EMS after he was found to be severely hypoxic at the nursing home.  Per EMS, staff found patient with oxygen saturations of 65%.  His trach was suctioned and oxygen was applied with an improvement to 75%.  Upon their arrival patient with oxygen saturation of 95% on trach collar.  Facility reported temperature 100.1 earlier in the day.  Patient was given Tylenol at 12:30 AM.  Last albuterol was at 1:50 AM.  Level 5 caveat.  History provided by medical record, EMS and staff at nursing facility.  Discussed with Cordelia Pen from [facility].  She reports that when checking his vital signs around 12AM his SPO2 was found to be 65%.  She reports pt appeared SOB and rhonchi which is not normal for him.  Pt denied pain. She reports he has been well over the the last few days.    The history is provided by the patient and medical records. No language interpreter was used.       Past Medical History:  Diagnosis Date  . Diabetes (HCC)   . GERD (gastroesophageal reflux disease)   . HLD (hyperlipidemia)   . HTN (hypertension)     Patient Active Problem List   Diagnosis Date Noted  . Acute on chronic systolic CHF (congestive heart failure) (HCC) 12/14/2019  . COPD (chronic obstructive pulmonary disease) (HCC)   . Sepsis with acute hypoxic respiratory failure without septic shock (HCC)   . Chronic HFrEF (heart failure with reduced ejection fraction) (HCC) 08/24/2019  . Thrombocytopenia (HCC) 08/24/2019  . 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/2018  . Bipolar I disorder, most recent episode depressed (HCC)  03/03/2018  . Schizophrenia (HCC) 02/27/2018  . GI bleed 01/06/2018  . Acute respiratory failure with hypoxia (HCC) 01/06/2018  . Pneumonia of both lungs due to infectious organism 01/06/2018  . Insulin-requiring or dependent type II diabetes mellitus (HCC) 01/06/2018    Past Surgical History:  Procedure Laterality Date  . IR GASTROSTOMY TUBE REMOVAL  08/15/2018  . PEG PLACEMENT N/A 01/21/2018   Procedure: PERCUTANEOUS ENDOSCOPIC GASTROSTOMY (PEG) PLACEMENT;  Surgeon: Wyline Mood, MD;  Location: [facility] ENDOSCOPY;  Service: Gastroenterology;  Laterality: N/A;  . TRACHEOSTOMY TUBE PLACEMENT N/A 01/23/2018   Procedure: TRACHEOSTOMY;  Surgeon: Bud Face, MD;  Location: [facility] ORS;  Service: ENT;  Laterality: N/A;       History reviewed. No pertinent family history.  Social History   Tobacco Use  . Smoking status: Current Every Day Smoker    Packs/day: 1.00    Years: 40.00    Pack years: 40.00    Types: Cigarettes    Start date: 2000  . Smokeless tobacco: Never Used  Substance Use Topics  . Alcohol use: Never  . Drug use: Never    Home Medications Prior to Admission medications   Medication Sig Start Date End Date Taking? Authorizing Provider  acetylcysteine (MUCOMYST) 10% SOLN Take 4 mLs by nebulization every 12 (twelve) hours as needed (secretions). 03/19/18  Yes Shaune Pollack, MD  bisacodyl (DULCOLAX) 10 MG suppository Place 1 suppository (10 mg total) rectally daily as needed for moderate constipation. 03/19/18  Yes Shaune Pollack, MD  budesonide (PULMICORT) 0.5 MG/2ML nebulizer solution Take 2 mLs (0.5 mg total) by nebulization 2 (two) times daily. 03/19/18  Yes Shaune Pollack, MD  clonazePAM (KLONOPIN) 0.5 MG tablet Take 0.5 tablets (0.25 mg total) by mouth 2 (two) times daily. 03/19/18  Yes Shaune Pollack, MD  digoxin (LANOXIN) 0.25 MG tablet Take 1 tablet (0.25 mg total) by mouth daily. 09/02/19  Yes Christian, Rylee, MD  divalproex (DEPAKOTE) 500 MG DR tablet Take 500 mg by mouth 2  (two) times daily.   Yes [provider]  docusate sodium (COLACE) 100 MG capsule Take 100 mg by mouth 2 (two) times daily.   Yes [provider]  FLUoxetine (PROZAC) 20 MG capsule Take 1 capsule (20 mg total) by mouth daily. 09/02/19  Yes Christian, Rylee, MD  furosemide (LASIX) 20 MG tablet Take 1 tablet (20 mg total) by mouth daily. 09/02/19  Yes Christian, Rylee, MD  gabapentin (NEURONTIN) 100 MG capsule Take 200 mg by mouth every 12 (twelve) hours.   Yes [provider]  guaiFENesin (MUCINEX) 600 MG 12 hr tablet Take 600 mg by mouth 2 (two) times daily.   Yes [provider]  insulin glargine (LANTUS) 100 UNIT/ML injection Inject 0.2 mLs (20 Units total) into the skin at bedtime. 03/19/18  Yes Shaune Pollack, MD  ipratropium-albuterol (DUONEB) 0.5-2.5 (3) MG/3ML SOLN Take 3 mLs by nebulization every 4 (four) hours as needed. 03/19/18  Yes Shaune Pollack, MD  ipratropium-albuterol (DUONEB) 0.5-2.5 (3) MG/3ML SOLN Take 3 mLs by nebulization every 4 (four) hours. 03/19/18  Yes Shaune Pollack, MD  lisinopril (ZESTRIL) 10 MG tablet Take 1 tablet (10 mg total) by mouth daily. 09/02/19  Yes Christian, Rylee, MD  magnesium oxide (MAG-OX) 400 (241.3 Mg) MG tablet Place 1 tablet (400 mg total) into feeding tube 2 (two) times daily. Patient taking differently: Take 400 mg by mouth 2 (two) times daily.  03/19/18  Yes Shaune Pollack, MD  metoprolol tartrate (LOPRESSOR) 25 MG tablet Place 0.5 tablets (12.5 mg total) into feeding tube every 6 (six) hours. Please hold for BP < 130 mmhg Patient taking differently: Take 12.5 mg by mouth every 6 (six) hours. Please hold for BP < 130 mmhg 03/19/18  Yes Shaune Pollack, MD  pantoprazole sodium (PROTONIX) 40 mg/20 mL PACK Place 20 mLs (40 mg total) into feeding tube daily. Patient taking differently: Take 40 mg by mouth daily. 40 mg tablet daily 03/20/18  Yes Shaune Pollack, MD  QUEtiapine (SEROQUEL) 200 MG tablet Take 1 tablet (200 mg total) by mouth at bedtime.  09/01/19  Yes Christian, Rylee, MD  sodium chloride HYPERTONIC 3 % nebulizer solution Take 4 mLs by nebulization daily. 03/20/18  Yes Shaune Pollack, MD    Allergies    Patient has no known allergies.  Review of Systems   Review of Systems  Unable to perform ROS: Dementia    Physical Exam Updated Vital Signs BP 116/74 (BP Location: Left Arm)   Pulse 79   Temp 97.8 F (36.6 C) (Oral)   Resp 15   Ht 6\' 3"  (1.905 m)   Wt 105.5 kg   SpO2 95%   BMI 29.07 kg/m   Physical Exam Vitals and nursing note reviewed.  Constitutional:      General: He is sleeping. He is not in acute distress.    Appearance: He is well-developed.     Comments: Pt arouses briefly to mumble, but immediately falls back asleep.  HENT:  Head: Normocephalic.  Eyes:     General: No scleral icterus.    Extraocular Movements: Extraocular movements intact.     Conjunctiva/sclera: Conjunctivae normal.  Neck:   Cardiovascular:     Rate and Rhythm: Normal rate.     Pulses:          Radial pulses are 2+ on the right side and 2+ on the left side.  Pulmonary:     Effort: Pulmonary effort is normal. Tachypnea present.     Breath sounds: Rhonchi (throughout) present.  Abdominal:     General: There is no distension.     Palpations: Abdomen is soft.     Tenderness: There is no abdominal tenderness. There is no guarding.    Musculoskeletal:        General: Normal range of motion.     Cervical back: Normal range of motion.     Right lower leg: Edema present.     Left lower leg: Edema present.  Skin:    General: Skin is warm and dry.     Comments: Hot to touch  Neurological:     Mental Status: He is lethargic.     GCS: GCS eye subscore is 3. GCS verbal subscore is 2. GCS motor subscore is 5.     ED Results / Procedures / Treatments   Labs (all labs ordered are listed, but only abnormal results are displayed) Labs Reviewed  COMPREHENSIVE METABOLIC PANEL - Abnormal; Notable for the following components:       Result Value   Calcium 8.8 (*)    Albumin 3.0 (*)    AST 14 (*)    All other components within normal limits  CBC WITH DIFFERENTIAL/PLATELET - Abnormal; Notable for the following components:   MCHC 29.9 (*)    RDW 15.8 (*)    nRBC 0.7 (*)    Monocytes Absolute 1.2 (*)    Abs Immature Granulocytes 0.15 (*)    All other components within normal limits  URINALYSIS, ROUTINE W REFLEX MICROSCOPIC - Abnormal; Notable for the following components:   Bilirubin Urine SMALL (*)    Ketones, ur 5 (*)    Protein, ur 30 (*)    Leukocytes,Ua TRACE (*)    All other components within normal limits  BRAIN NATRIURETIC PEPTIDE - Abnormal; Notable for the following components:   B Natriuretic Peptide 877.2 (*)    All other components within normal limits  POCT I-STAT 7, (LYTES, BLD GAS, ICA,H+H) - Abnormal; Notable for the following components:   pCO2 arterial 50.4 (*)    pO2, Arterial 46.0 (*)    Bicarbonate 30.9 (*)    Acid-Base Excess 4.0 (*)    All other components within normal limits  TROPONIN I (HIGH SENSITIVITY) - Abnormal; Notable for the following components:   Troponin I (High Sensitivity) 19 (*)    All other components within normal limits  CULTURE, BLOOD (ROUTINE X 2)  CULTURE, BLOOD (ROUTINE X 2)  URINE CULTURE  SARS CORONAVIRUS 2 (TAT 6-24 HRS)  LACTIC ACID, PLASMA  APTT  PROTIME-INR  LACTIC ACID, PLASMA  BLOOD GAS, ARTERIAL  POC SARS CORONAVIRUS 2 AG -  ED    EKG EKG Interpretation  Date/Time:  Monday December 14 2019 02:41:49 EDT Ventricular Rate:  79 PR Interval:    QRS Duration: 98 QT Interval:  389 QTC Calculation: 446 R Axis:   63 Text Interpretation: Sinus rhythm Abnormal R-wave progression, early transition Minimal ST depression No significant change since last tracing Confirmed  by Drema Pry [phone]) on 12/14/2019 2:49:29 AM   Radiology DG Chest Port 1 View  Result Date: 12/14/2019 CLINICAL DATA:  Hypoxia EXAM: PORTABLE CHEST 1 VIEW COMPARISON:  08/24/2019  FINDINGS: Unchanged position of tracheostomy tube with tip at the level of the clavicular heads. Bilateral basilar predominant airspace opacities. Small pleural effusions. IMPRESSION: Bibasilar predominant opacities, likely pulmonary edema. Small pleural effusions. Electronically Signed   By: Deatra Robinson M.D.   On: 12/14/2019 03:19    Procedures Procedures (including critical care time)  Medications Ordered in ED Medications  furosemide (LASIX) injection 40 mg (40 mg Intravenous Given 12/14/19 0525)    ED Course  I have reviewed the triage vital signs and the nursing notes.  Pertinent labs & imaging results that were available during my care of the patient were reviewed by me and considered in my medical decision making (see chart for details).  Clinical Course as of Dec 13 613  Mon Dec 14, 2019  0257 Patient dropped to 88% on room air.  Replaced on trach collar at 2 L/min with improvement to 91%.   [HM]  0410 Hypoxic on blood gas.  Patient placed on 8 L via trach collar.   pO2, Arterial(!): 46.0 [HM]    Clinical Course User Index [HM] Misbah Hornaday, Boyd Kerbs   MDM Rules/Calculators/A&P                       Tommy Mathews was evaluated in Emergency Department on 12/14/2019 for the symptoms described in the history of present illness. He was evaluated in the context of the global COVID-19 pandemic, which necessitated consideration that the patient might be at risk for infection with the SARS-CoV-2 virus that causes COVID-19. Institutional protocols and algorithms that pertain to the evaluation of patients at risk for COVID-19 are in a state of rapid change based on information released by regulatory bodies including the CDC and federal and state organizations. These policies and algorithms were followed during the patient's care in the ED.  Patient presents with altered mental status, shortness of breath and hypoxia.  Per facility, febrile prior to arrival however Tylenol was given  around 12:30 AM.  Patient without tachycardia here in the emergency department.  No hypotension.  No leukocytosis.  Chest x-ray without consolidation, but bibasilar opacities felt to be secondary to pulmonary edema.  5:36 AM Patient with evidence of fluid overload and pulmonary edema.  BNP elevated.  2+ pitting edema.  Lasix given.  Covid antigen test negative.  As patient has COPD and remains hypoxic, albuterol was ordered however it has not yet been given due to no negative Covid PCR test.   The patient was discussed with and seen by Dr. Eudelia Bunch who agrees with the treatment plan.  Discussed patient's case with hospitalist, [place].  I have recommended admission and patient (and family if present) agree with this plan. Admitting physician will place admission orders.    Final Clinical Impression(s) / ED Diagnoses Final diagnoses:  Hypoxia  Acute on chronic congestive heart failure, unspecified heart failure type [facility])    Rx / DC Orders ED Discharge Orders    None       Malijah Lietz, Boyd Kerbs 12/14/19 0615    Nira Conn, MD 12/15/19 [phone]

## 2019-12-14 NOTE — Progress Notes (Signed)
Hospitalist progress note   Patient from [facility] skilled facility, Patient going back to facility, Dispo needs inpatient admission at this time  Tommy Mathews [id number] DOB: [birth year] DOA: 12/14/2019  PCP: Georgann Housekeeper, MD   Narrative:  13 advanced COPD status post trach since admission 01/06/2018-->03/19/2018,, previous traumatic brain injury + depression + schizophrenia-also dysphagia, DM TY 2, paroxysmal atrial fibrillation CHADS2 score >4 no anticoagulation, GI bleed-upper scope 01/21/2018 Was negative  Admitted 12/14/2019 shortness of breath hypoxia O2 sats in the 60s found to be afebrile saturating mid 80s on room air In the ED CXR bibasilar opacities small effusions?  CHF BNP 877 Lasix 40 IV given Covid negative  Data Reviewed:  BUN/creatinine 12/1.1 WBC 10 Hemoglobin 14.9 BNP 877 Troponin X 19 Lactic acid 1.0  Assessment & Plan:  Acute hypoxic respiratory failure secondary to decompensated heart failure Reviewed records from [facility] facility shows T-max 100.1 however his presentation is more in keeping with heart failure-strict I/O, his weight today is about the same as his weight back in 09/1999 2105 kg monitor His home dose of Lasix is 20 daily Echocardiogram is pending As is Lasix nave continue IV 40 twice daily Resumed Lopressor 25 twice daily, digoxin 0.25 and lisinopril 10 Keep on telemetry and monitor Recheck a.m. labs inclusive of magnesium Previous TBI + trach Responses slightly slow but overall able to verbalize how he feels We will communicate with family Trach dependent and requires trach clinic follow-up and possible eventual decannulation Continue Mucomyst every 12 as needed secretion, hypertonic saline 4 mils by mouth daily, Pulmicort 2 mg twice daily duo nebs as needed Tinea corporis, tinea capitis and plaque-like areas on back We will treat for fungal infection of skin with Diflucan 100 twice daily for 2 weeks Get screening LFTs-May need  outpatient evaluation by dermatology as this also looks like it could be psoriasis but he does not have keratinization of the elbows Dysphagia Risk for pneumonia therefore I will repeat CXR once diuresis some more-strict I/O DM TY 2 Home meds include Lantus 20 units Does not appear to be on short acting therefore we will just keep on sliding scale and 5 units of Lantus here gabapentin 200 twice daily Paroxysmal A. fib CHADS2 score >4, has bled score >3 with prior large GI bleed 2019 Continue meds as above with digoxin 0.25 in addition Keep on telemetry today and monitor Prior GI bleed Continue indefinite Protonix 20 daily however may need to discuss de-escalation given no recent bleed depression, low mental acuity secondary to TBI Continue Seroquel 200 at bedtime, Prozac 20 daily, Depakote 500 twice daily, Klonopin 0.25 twice daily    Subjective: Awake alert coherent-slightly faltering speech at times but tells me he is in the hospital does note that this is [facility] but thinks he is at [facility] Can tell me the year Reports he came over here because of "fever" He does not seem to be in distress at this time It is quite difficult for him to sit up on his own  Consultants:   None yet  Objective: Vitals:   12/14/19 0500 12/14/19 0600 12/14/19 0700 12/14/19 0730  BP: 111/64 116/80 125/82 108/85  Pulse: 72 72 66 66  Resp: 20     Temp:      TempSrc:      SpO2: 96% (!) 86% 93% 96%  Weight:      Height:       No intake or output data in the 24 hours ending 12/14/19 0809  Filed Weights   12/14/19 0243  Weight: 105.5 kg    Examination: Tinea barbae, tenia capitis and area on back large plaque-like with scale as well as keratinization Throat soft supple Abdomen soft no rebound no guarding Condom catheter in place Trace lower extremity edema pulses not evaluated No overt swelling Chest clinically clear no rales no rhonchi S1-S2 no murmur rub or gallop Neurologically intact  moving all 4 limbs  Scheduled Meds: . budesonide  0.5 mg Nebulization BID  . clonazepam  0.25 mg Oral BID  . digoxin  0.25 mg Oral Daily  . divalproex  500 mg Oral BID  . enoxaparin (LOVENOX) injection  40 mg Subcutaneous Daily  . FLUoxetine  20 mg Oral Daily  . furosemide  40 mg Intravenous Q12H  . gabapentin  200 mg Oral Q12H  . guaiFENesin  600 mg Oral BID  . insulin aspart  0-9 Units Subcutaneous TID WC  . insulin glargine  5 Units Subcutaneous QHS  . lisinopril  10 mg Oral Daily  . metoprolol tartrate  25 mg Oral BID  . pantoprazole  40 mg Oral Daily  . QUEtiapine  200 mg Oral QHS  . sodium chloride flush  3 mL Intravenous Q12H  . sodium chloride HYPERTONIC  4 mL Nebulization Daily   Continuous Infusions: . sodium chloride       LOS: 0 days   Time spent: [place], MD [facility]  12/14/2019, 8:09 AM

## 2019-12-14 NOTE — ED Triage Notes (Signed)
Pt arrived from [facility] via [place] with chronic trach. Per EMS facility stated pt was found with a SPo2 of 65%, pt given neb treatment and suction at facility bringing SPo2 up to 75% upon which EMS was called.  Upon arrival EMS found pt to be 95% with 5L via trach mask.  Pt baseline is not on supplemental O2.  Pt denies feeling short of breath ands stated all of this started after he when for a smoke at facility.

## 2019-12-15 ENCOUNTER — Inpatient Hospital Stay (HOSPITAL_COMMUNITY): Payer: Medicaid Other

## 2019-12-15 LAB — CBC WITH DIFFERENTIAL/PLATELET
Abs Immature Granulocytes: 0.1 10*3/uL — ABNORMAL HIGH (ref 0.00–0.07)
Basophils Absolute: 0.1 10*3/uL (ref 0.0–0.1)
Basophils Relative: 1 %
Eosinophils Absolute: 0.2 10*3/uL (ref 0.0–0.5)
Eosinophils Relative: 2 %
HCT: 48.6 % (ref 39.0–52.0)
Hemoglobin: 14.9 g/dL (ref 13.0–17.0)
Immature Granulocytes: 1 %
Lymphocytes Relative: 31 %
Lymphs Abs: 3.1 10*3/uL (ref 0.7–4.0)
MCH: 27.2 pg (ref 26.0–34.0)
MCHC: 30.7 g/dL (ref 30.0–36.0)
MCV: 88.8 fL (ref 80.0–100.0)
Monocytes Absolute: 1.1 10*3/uL — ABNORMAL HIGH (ref 0.1–1.0)
Monocytes Relative: 11 %
Neutro Abs: 5.4 10*3/uL (ref 1.7–7.7)
Neutrophils Relative %: 54 %
Platelets: 229 10*3/uL (ref 150–400)
RBC: 5.47 MIL/uL (ref 4.22–5.81)
RDW: 15.8 % — ABNORMAL HIGH (ref 11.5–15.5)
WBC: 9.9 10*3/uL (ref 4.0–10.5)
nRBC: 0.3 % — ABNORMAL HIGH (ref 0.0–0.2)

## 2019-12-15 LAB — GLUCOSE, CAPILLARY
Glucose-Capillary: 74 mg/dL (ref 70–99)
Glucose-Capillary: 85 mg/dL (ref 70–99)
Glucose-Capillary: 108 mg/dL — ABNORMAL HIGH (ref 70–99)
Glucose-Capillary: 145 mg/dL — ABNORMAL HIGH (ref 70–99)
Glucose-Capillary: 108 mg/dL — ABNORMAL HIGH (ref 70–99)

## 2019-12-15 LAB — URINE CULTURE

## 2019-12-15 LAB — BASIC METABOLIC PANEL
Anion gap: 14 (ref 5–15)
BUN: 17 mg/dL (ref 6–20)
CO2: 26 mmol/L (ref 22–32)
Calcium: 8.5 mg/dL — ABNORMAL LOW (ref 8.9–10.3)
Chloride: 99 mmol/L (ref 98–111)
Creatinine, Ser: 1.13 mg/dL (ref 0.61–1.24)
GFR calc Af Amer: >60 mL/min (ref >60)
GFR calc non Af Amer: >60 mL/min (ref >60)
Glucose, Bld: 156 mg/dL — ABNORMAL HIGH (ref 70–99)
Potassium: 4 mmol/L (ref 3.5–5.1)
Sodium: 139 mmol/L (ref 135–145)

## 2019-12-15 LAB — HEMOGLOBIN A1C
Hgb A1c MFr Bld: 6.9 % — ABNORMAL HIGH (ref 4.8–5.6)
Mean Plasma Glucose: 151.33 mg/dL

## 2019-12-15 LAB — MRSA PCR SCREENING: MRSA by PCR: NEGATIVE

## 2019-12-15 MED ORDER — ENOXAPARIN SODIUM 40 MG/0.4ML ~~LOC~~ SOLN
40.0000 mg | SUBCUTANEOUS | Status: DC
  Administered 2019-12-16 – 2019-12-20 (×5): 40 mg via SUBCUTANEOUS
  Filled 2019-12-15 (×5): qty 0.4

## 2019-12-15 MED ORDER — CHLORHEXIDINE GLUCONATE 0.12 % MT SOLN
15.0000 mL | Freq: Two times a day (BID) | OROMUCOSAL | Status: DC
  Administered 2019-12-15 – 2019-12-19 (×10): 15 mL via OROMUCOSAL
  Filled 2019-12-15 (×10): qty 15

## 2019-12-15 MED ORDER — ORAL CARE MOUTH RINSE
15.0000 mL | Freq: Two times a day (BID) | OROMUCOSAL | Status: DC
  Administered 2019-12-15 – 2019-12-18 (×6): 15 mL via OROMUCOSAL

## 2019-12-15 NOTE — Progress Notes (Signed)
Patient arriving from SNF.  It is unclear when his trach was last changed.  Currently, he has in a #6 shiley cuffed.  He is currently using his PMV. Extra trach for the bedside is on order.  All other necessary trach care equipment, including ambu bag and suction, is at the bedside.

## 2019-12-15 NOTE — Plan of Care (Signed)
  Problem: Education: Goal: Knowledge of General Education information will improve Description: Including pain rating scale, medication(s)/side effects and non-pharmacologic comfort measures Outcome: Progressing   Problem: Health Behavior/Discharge Planning: Goal: Ability to manage health-related needs will improve Outcome: Progressing   Problem: Clinical Measurements: Goal: Will remain free from infection Outcome: Progressing Goal: Diagnostic test results will improve Outcome: Progressing   Problem: Nutrition: Goal: Adequate nutrition will be maintained Outcome: Progressing   

## 2019-12-15 NOTE — Plan of Care (Signed)

## 2019-12-15 NOTE — Progress Notes (Addendum)
Hospitalist progress note   Patient from [facility] skilled facility, Patient going back to facility, Dispo needs inpatient admission at this time-likely can d/c in 48 hour sto SNF Lovenox for DVT prophylaxis no answer and left message  Tommy Mathews [id number] DOB: 08/16/1961 DOA: 12/14/2019  PCP: Wenda Low, MD   Narrative:  14 advanced COPD status post trach since admission 01/06/2018-->03/19/2018,, previous traumatic brain injury + depression + schizophrenia-also dysphagia, DM TY 2, paroxysmal atrial fibrillation CHADS2 score >4 no anticoagulation, GI bleed-upper scope 01/21/2018 Was negative  Admitted 12/14/2019 shortness of breath hypoxia O2 sats in the 60s found to be afebrile saturating mid 80s on room air In the ED CXR bibasilar opacities small effusions?  CHF BNP 877 Lasix 40 IV given Covid negative  Data Reviewed:  BUN/creatinine 12/1.1-->17/1.13 WBC 10->9.9 Hemoglobin 14.9 BNP 877 Troponin X 19 Lactic acid 1.0  CXr my over-read 3/16 shows improved effusions on L side  ECHO 3/15 IMPRESSIONS    1. Compared with the echo 10/7614, systolic function has improved. Left  ventricular ejection fraction, by estimation, is 55 to 60%. The left  ventricle has normal function. The left ventricle has no regional wall  motion abnormalities. The left ventricular  internal cavity size was mildly dilated. There is mild concentric left  ventricular hypertrophy. Left ventricular diastolic parameters are  consistent with Grade II diastolic dysfunction (pseudonormalization).  2. Right ventricular systolic function is normal. The right ventricular  size is normal. There is normal pulmonary artery systolic pressure.  3. The mitral valve is normal in structure. No evidence of mitral valve  regurgitation. No evidence of mitral stenosis.  4. The aortic valve is tricuspid. Aortic valve regurgitation is not  visualized. No aortic stenosis is present.  5. The inferior vena cava is normal in  size with greater than 50%  respiratory variability, suggesting right atrial pressure of 3 mmHg.   Assessment & Plan:  Acute hypoxic respiratory failure secondary to decompensated heart failure Reviewed records from [facility] facility shows T-max 100.1 however his presentation is more in keeping with heart failure-strict I/O His home dose of Lasix is 20 daily As is Lasix nave continue IV 40 twice daily--Still I/o + 570 cc--strict fluid restriction 1200 place restriction on the door Resumed Lopressor 25 twice daily, digoxin 0.25 and lisinopril 10 Improved some Previous TBI + trach Responses slightly slow but overall able to verbalize how he feels Trach dependent and requires trach clinic follow-up and possible eventual decannulation Continue Mucomyst every 12 as needed secretion, hypertonic saline 4 mils by mouth daily, Pulmicort 2 mg twice daily duo nebs as needed Tinea corporis, tinea capitis and plaque-like areas on back [Fungus vs psoriatic] We will treat for fungal infection of skin with Diflucan 100 twice daily for 2 weeks Dysphagia Risk for pneumonia therefore CXR repeated shows improvement of fluid no pneumonia DM TY 2 A1c 6.9 Home meds include Lantus 20 units CBG 85-110 on sliding scale and 5 units of Lantus here gabapentin 200 twice daily Paroxysmal A. fib CHADS2 score >4, has bled score >3 with prior large GI bleed 2019 Continue meds as above with digoxin 0.25 in addition Keep on telemetry today and monitor Prior GI bleed Continue indefinite Protonix 20 daily however may need to discuss de-escalation given no recent bleed depression, low mental acuity secondary to TBI Continue Seroquel 200 at bedtime, Prozac 20 daily, Depakote 500 twice daily, Klonopin 0.25 twice daily  Called [phone] no answer Called [phone] no answer and left message  Subjective:  Awake no events overnight-still thinks he is at [facility] but tells me correctly his nursing facility No fever  no chills  Consultants:   None yet  Objective: Vitals:   12/15/19 0500 12/15/19 0745 12/15/19 0752 12/15/19 0954  BP:  119/74    Pulse:   (!) 52 64  Resp:   (!) 22   Temp:  98.1 F (36.7 C)    TempSrc:  Oral    SpO2:      Weight: 102 kg     Height:        Intake/Output Summary (Last 24 hours) at 12/15/2019 1025 Last data filed at 12/15/2019 0800 Gross per 24 hour  Intake 720 ml  Output 150 ml  Net 570 ml   Filed Weights   12/14/19 0243 12/15/19 0000 12/15/19 0500  Weight: 105.5 kg 101.5 kg 102 kg    Examination: Tinea barbae, tenia capitis and area on back large plaque-like with scale as well as keratinization Throat soft supple Abdomen soft no rebound no guarding Condom catheter in place Trace lower extremity edema pulses not evaluated S1-S2 no murmur rub or gallop Neurologically intact moving all 4 limbs  Scheduled Meds: . budesonide  0.5 mg Nebulization BID  . chlorhexidine  15 mL Mouth Rinse BID  . clonazepam  0.25 mg Oral BID  . digoxin  0.25 mg Oral Daily  . divalproex  500 mg Oral BID  . enoxaparin (LOVENOX) injection  40 mg Subcutaneous Daily  . fluconazole  100 mg Oral Daily  . FLUoxetine  20 mg Oral Daily  . furosemide  40 mg Intravenous Q12H  . gabapentin  200 mg Oral Q12H  . guaiFENesin  600 mg Oral BID  . insulin aspart  0-9 Units Subcutaneous TID WC  . insulin glargine  5 Units Subcutaneous QHS  . lisinopril  10 mg Oral Daily  . magnesium oxide  400 mg Oral BID  . mouth rinse  15 mL Mouth Rinse q12n4p  . metoprolol tartrate  25 mg Oral BID  . pantoprazole  40 mg Oral Daily  . QUEtiapine  200 mg Oral QHS  . sodium chloride flush  3 mL Intravenous Q12H  . sodium chloride HYPERTONIC  4 mL Nebulization Daily   Continuous Infusions: . sodium chloride       LOS: 1 day   Time spent: 82 Pleas Koch, MD [facility]  12/15/2019, 10:25 AM

## 2019-12-15 NOTE — ED Provider Notes (Signed)
Attestation: Medical screening examination/treatment/procedure(s) were conducted as a shared visit with non-physician practitioner(s) and myself.  I personally evaluated the patient during the encounter.   Briefly, the patient is a 59 y.o. male with h/o COPD, chronic heart failure, schizophrenia, dementia, TBI, trach in place presents to the Emergency Department via EMS after he was found to be severely hypoxic at the nursing home.  Vitals:   12/15/19 0316 12/15/19 0325  BP: 109/73   Pulse: (!) 57 (!) 55  Resp: 20 16  Temp: 97.6 F (36.4 C)   SpO2: (!) 89% 94%    CONSTITUTIONAL:  ill-appearing, NAD NEURO:  Sleepy, arousable. no focal deficits EYES:  pupils equal and reactive ENT/NECK:  trachea midline, no JVD. Trache collar CARDIO:  Reg  rate, reg rhythm, well-perfused. BLE edema PULM:  Mildly labored breathing, coarse lung sounds GI/GU:  Abdomin non-distended MSK/SPINE:  No gross deformities, no edema SKIN:  no rash, atraumatic    EKG Interpretation  Date/Time:  Monday December 14 2019 02:41:49 EDT Ventricular Rate:  79 PR Interval:    QRS Duration: 98 QT Interval:  389 QTC Calculation: 446 R Axis:   63 Text Interpretation: Sinus rhythm Abnormal R-wave progression, early transition Minimal ST depression No significant change since last tracing Confirmed by Drema Pry [phone]) on 12/14/2019 2:49:29 AM       Work up consistent with COPD and CHF exacerbation requiring admission for further management.Nira Conn, MD 12/15/19 [phone]

## 2019-12-16 DIAGNOSIS — J9601 Acute respiratory failure with hypoxia: Secondary | ICD-10-CM

## 2019-12-16 DIAGNOSIS — I480 Paroxysmal atrial fibrillation: Secondary | ICD-10-CM

## 2019-12-16 DIAGNOSIS — E119 Type 2 diabetes mellitus without complications: Secondary | ICD-10-CM

## 2019-12-16 DIAGNOSIS — Z794 Long term (current) use of insulin: Secondary | ICD-10-CM

## 2019-12-16 DIAGNOSIS — J449 Chronic obstructive pulmonary disease, unspecified: Secondary | ICD-10-CM

## 2019-12-16 DIAGNOSIS — F3130 Bipolar disorder, current episode depressed, mild or moderate severity, unspecified: Secondary | ICD-10-CM

## 2019-12-16 DIAGNOSIS — I5023 Acute on chronic systolic (congestive) heart failure: Secondary | ICD-10-CM

## 2019-12-16 LAB — BASIC METABOLIC PANEL
Anion gap: 9 (ref 5–15)
BUN: 15 mg/dL (ref 6–20)
CO2: 31 mmol/L (ref 22–32)
Calcium: 8.6 mg/dL — ABNORMAL LOW (ref 8.9–10.3)
Chloride: 97 mmol/L — ABNORMAL LOW (ref 98–111)
Creatinine, Ser: 1.04 mg/dL (ref 0.61–1.24)
GFR calc Af Amer: >60 mL/min (ref >60)
GFR calc non Af Amer: >60 mL/min (ref >60)
Glucose, Bld: 120 mg/dL — ABNORMAL HIGH (ref 70–99)
Potassium: 3.8 mmol/L (ref 3.5–5.1)
Sodium: 137 mmol/L (ref 135–145)

## 2019-12-16 LAB — CBC WITH DIFFERENTIAL/PLATELET
Abs Immature Granulocytes: 0.06 10*3/uL (ref 0.00–0.07)
Basophils Absolute: 0 10*3/uL (ref 0.0–0.1)
Basophils Relative: 0 %
Eosinophils Absolute: 0.1 10*3/uL (ref 0.0–0.5)
Eosinophils Relative: 2 %
HCT: 49.8 % (ref 39.0–52.0)
Hemoglobin: 15.3 g/dL (ref 13.0–17.0)
Immature Granulocytes: 1 %
Lymphocytes Relative: 36 %
Lymphs Abs: 2.6 10*3/uL (ref 0.7–4.0)
MCH: 27.1 pg (ref 26.0–34.0)
MCHC: 30.7 g/dL (ref 30.0–36.0)
MCV: 88.3 fL (ref 80.0–100.0)
Monocytes Absolute: 0.9 10*3/uL (ref 0.1–1.0)
Monocytes Relative: 12 %
Neutro Abs: 3.6 10*3/uL (ref 1.7–7.7)
Neutrophils Relative %: 49 %
Platelets: 240 10*3/uL (ref 150–400)
RBC: 5.64 MIL/uL (ref 4.22–5.81)
RDW: 15.6 % — ABNORMAL HIGH (ref 11.5–15.5)
WBC: 7.3 10*3/uL (ref 4.0–10.5)
nRBC: 0 % (ref 0.0–0.2)

## 2019-12-16 LAB — GLUCOSE, CAPILLARY
Glucose-Capillary: 88 mg/dL (ref 70–99)
Glucose-Capillary: 140 mg/dL — ABNORMAL HIGH (ref 70–99)
Glucose-Capillary: 105 mg/dL — ABNORMAL HIGH (ref 70–99)
Glucose-Capillary: 91 mg/dL (ref 70–99)

## 2019-12-16 MED ORDER — LISINOPRIL 10 MG PO TABS
10.0000 mg | ORAL_TABLET | Freq: Every day | ORAL | Status: DC
  Administered 2019-12-17 – 2019-12-20 (×4): 10 mg via ORAL
  Filled 2019-12-16 (×4): qty 1

## 2019-12-16 MED ORDER — DIGOXIN 125 MCG PO TABS
0.2500 mg | ORAL_TABLET | Freq: Every day | ORAL | Status: DC
  Administered 2019-12-17 – 2019-12-20 (×4): 0.25 mg via ORAL
  Filled 2019-12-16 (×4): qty 2

## 2019-12-16 MED ORDER — METOPROLOL TARTRATE 25 MG PO TABS
25.0000 mg | ORAL_TABLET | Freq: Two times a day (BID) | ORAL | Status: DC
  Administered 2019-12-17 – 2019-12-20 (×6): 25 mg via ORAL
  Filled 2019-12-16 (×8): qty 1

## 2019-12-16 NOTE — Progress Notes (Signed)
PROGRESS NOTE  Tommy Mathews GBT:[id number] DOB: 12/06/1960   PCP: Wenda Low, MD  [facility] is from: [facility]  DOA: 12/14/2019 LOS: 2  Brief Narrative / Interim history: 59 year old male with history of COPD s/p trach in 7106, systolic CHF, TBI, DM-2, paroxysmal A. fib, GI bleed, depression, BPD-1, and schizophrenia presented with shortness of breath and hypoxemia to 60%.  He was admitted with acute on chronic respiratory failure with hypoxia due to acute on chronic systolic CHF and started on IV Lasix.  COVID-19 was negative.  Subjective: Seen and examined earlier this morning.  No major events overnight or this morning.  No complaints.  He nods no to pain or shortness of breath.  Saturating 88% on 10 L / 35% via trach.  About 3.5 L urine output.  No apparent distress.  Objective: Vitals:   12/16/19 0736 12/16/19 0737 12/16/19 1045 12/16/19 1103  BP:  105/74 137/75   Pulse:    (!) 58  Resp:   18 18  Temp:      TempSrc:      SpO2: 94%   96%  Weight:      Height:        Intake/Output Summary (Last 24 hours) at 12/16/2019 1113 Last data filed at 12/16/2019 0500 Gross per 24 hour  Intake 920 ml  Output 2650 ml  Net -1730 ml   Filed Weights   12/15/19 0000 12/15/19 0500 12/16/19 0600  Weight: 101.5 kg 102 kg 101.5 kg    Examination:  GENERAL: No acute distress.  Appears well.  HEENT: MMM.  Vision and hearing grossly intact.  NECK: Tracheostomy RESP: 88% on10 L / 35% via trach.  No IWOB.  Fair aeration bilaterally. CVS: RRR. Heart sounds normal.  ABD/GI/GU: Bowel sounds present. Soft. Non tender.  MSK/EXT:  Moves extremities. No apparent deformity. No edema.  SKIN: no apparent skin lesion or wound NEURO: Awake, alert.  Responds by nodding his head.  No apparent focal neuro deficit. PSYCH: Calm. Normal affect.  Procedures:  None  Assessment & Plan: Acute on chronic respiratory failure with hypoxia in patient with tracheostomy: due to systolic CHF  exacerbation. -Wean oxygen as able to home level. -Treat CHF as below.  -Breathing treatments and tracheostomy care per respiratory therapy  Acute on chronic systolic CHF with recovered LV function: Echo with EF of 55 to 60% (20 to 25% in 2019), G-2DD and no other significant finding.  Responding well to IV Lasix.  Had about 3.5 L urine output in the last 24 hours.  Renal function stable.  Weight seems to be inaccurate. -Continue IV Lasix 40 mg twice daily -Monitor fluid status, renal function and electrolytes -Sodium and fluid restriction  History of TBI with tracheostomy-stable.  -PT/OT  Consult DM-2 with hyperglycemia: A1c 6.9%. Recent Labs    12/15/19 1630 12/15/19 1940 12/16/19 0642  GLUCAP 145* 108* 88  -Continue current regimen with a statin.  Paroxysmal A. fib: On metoprolol and digoxin at home.  CHA2DS2-VASc score > 4 but history of GI bleed. -Continue metoprolol and digoxin  History of GI bleed/GERD -Continue PPI  Depression/BPD-1/schizophrenia: Stable. -Continue home meds-Seroquel, Prozac, Depakote, Klonopin               DVT prophylaxis: Subcu Lovenox Code Status: Full code Family Communication: Patient and/or RN. Available if any question.   Discharge barrier: Acute CHF exacerbation on IV Lasix for adequate diuresis Patient is from: SNF Final disposition: SNF likely in the next 48 hours  Consultants: None   Microbiology summarized: 3/15-COVID-19 PCR negative 3/15-urine culture multiple species 3/15-blood cultures NGTD  Sch Meds:  Scheduled Meds: . budesonide  0.5 mg Nebulization BID  . chlorhexidine  15 mL Mouth Rinse BID  . clonazePAM  0.25 mg Oral BID  . digoxin  0.25 mg Oral Daily  . divalproex  500 mg Oral BID  . enoxaparin (LOVENOX) injection  40 mg Subcutaneous Q24H  . fluconazole  100 mg Oral Daily  . FLUoxetine  20 mg Oral Daily  . furosemide  40 mg Intravenous Q12H  . gabapentin  200 mg Oral Q12H  . guaiFENesin  600 mg Oral BID    . insulin aspart  0-9 Units Subcutaneous TID WC  . insulin glargine  5 Units Subcutaneous QHS  . lisinopril  10 mg Oral Daily  . magnesium oxide  400 mg Oral BID  . mouth rinse  15 mL Mouth Rinse q12n4p  . metoprolol tartrate  25 mg Oral BID  . pantoprazole  40 mg Oral Daily  . QUEtiapine  200 mg Oral QHS  . sodium chloride flush  3 mL Intravenous Q12H  . sodium chloride HYPERTONIC  4 mL Nebulization Daily   Continuous Infusions: . sodium chloride     PRN Meds:.sodium chloride, acetaminophen, acetylcysteine, ipratropium-albuterol, ondansetron (ZOFRAN) IV, sodium chloride flush  Antimicrobials: Anti-infectives (From admission, onward)   Start     Dose/Rate Route Frequency Ordered Stop   12/14/19 1000  fluconazole (DIFLUCAN) tablet 100 mg     100 mg Oral Daily 12/14/19 0902 12/28/19 0959       I have personally reviewed the following labs and images: CBC: Recent Labs  Lab 12/14/19 0240 12/14/19 0327 12/15/19 0122 12/16/19 0245  WBC 10.3  --  9.9 7.3  NEUTROABS 5.8  --  5.4 3.6  HGB 14.9 15.3 14.9 15.3  HCT 49.8 45.0 48.6 49.8  MCV 90.7  --  88.8 88.3  PLT 221  --  229 240   BMP &GFR Recent Labs  Lab 12/14/19 0240 12/14/19 0327 12/15/19 0122 12/16/19 0245  NA 139 140 139 137  K 4.1 3.7 4.0 3.8  CL 101  --  99 97*  CO2 28  --  26 31  GLUCOSE 96  --  156* 120*  BUN 12  --  17 15  CREATININE 1.12  --  1.13 1.04  CALCIUM 8.8*  --  8.5* 8.6*   Estimated Creatinine Clearance: 101.2 mL/min (by C-G formula based on SCr of 1.04 mg/dL). Liver & Pancreas: Recent Labs  Lab 12/14/19 0240  AST 14*  ALT 9  ALKPHOS 54  BILITOT 0.5  PROT 6.7  ALBUMIN 3.0*   No results for input(s): LIPASE, AMYLASE in the last 168 hours. No results for input(s): AMMONIA in the last 168 hours. Diabetic: Recent Labs    12/15/19 0122  HGBA1C 6.9*   Recent Labs  Lab 12/15/19 0743 12/15/19 1150 12/15/19 1630 12/15/19 1940 12/16/19 0642  GLUCAP 85 108* 145* 108* 88    Cardiac Enzymes: No results for input(s): CKTOTAL, CKMB, CKMBINDEX, TROPONINI in the last 168 hours. No results for input(s): PROBNP in the last 8760 hours. Coagulation Profile: Recent Labs  Lab 12/14/19 0240  INR 1.0   Thyroid Function Tests: No results for input(s): TSH, T4TOTAL, FREET4, T3FREE, THYROIDAB in the last 72 hours. Lipid Profile: No results for input(s): CHOL, HDL, LDLCALC, TRIG, CHOLHDL, LDLDIRECT in the last 72 hours. Anemia Panel: No results for input(s): VITAMINB12, FOLATE, FERRITIN,  TIBC, IRON, RETICCTPCT in the last 72 hours. Urine analysis:    Component Value Date/Time   COLORURINE YELLOW 12/14/2019 0430   APPEARANCEUR CLEAR 12/14/2019 0430   LABSPEC 1.029 12/14/2019 0430   PHURINE 5.0 12/14/2019 0430   GLUCOSEU NEGATIVE 12/14/2019 0430   HGBUR NEGATIVE 12/14/2019 0430   BILIRUBINUR SMALL (A) 12/14/2019 0430   KETONESUR 5 (A) 12/14/2019 0430   PROTEINUR 30 (A) 12/14/2019 0430   NITRITE NEGATIVE 12/14/2019 0430   LEUKOCYTESUR TRACE (A) 12/14/2019 0430   Sepsis Labs: Invalid input(s): PROCALCITONIN, LACTICIDVEN  Microbiology: Recent Results (from the past 240 hour(s))  Blood Culture (routine x 2)     Status: None (Preliminary result)   Collection Time: 12/14/19  2:57 AM   Specimen: BLOOD LEFT HAND  Result Value Ref Range Status   Specimen Description BLOOD LEFT HAND  Final   Special Requests   Final    BOTTLES DRAWN AEROBIC AND ANAEROBIC Blood Culture results may not be optimal due to an excessive volume of blood received in culture bottles   Culture   Final    NO GROWTH 2 DAYS Performed at [facility], [address], [place], [place] [address]    Report Status PENDING  Incomplete  Urine culture     Status: Abnormal   Collection Time: 12/14/19  2:57 AM   Specimen: In/Out Cath Urine  Result Value Ref Range Status   Specimen Description IN/OUT CATH URINE  Final   Special Requests   Final    NONE Performed at [facility],  [address] [place], [place] [address]    Culture MULTIPLE SPECIES PRESENT, SUGGEST RECOLLECTION (A)  Final   Report Status 12/15/2019 FINAL  Final  Blood Culture (routine x 2)     Status: None (Preliminary result)   Collection Time: 12/14/19  3:02 AM   Specimen: BLOOD  Result Value Ref Range Status   Specimen Description BLOOD RIGHT ANTECUBITAL  Final   Special Requests   Final    BOTTLES DRAWN AEROBIC AND ANAEROBIC Blood Culture adequate volume   Culture   Final    NO GROWTH 2 DAYS Performed at [facility], [address], [place], [place] [address]    Report Status PENDING  Incomplete  SARS CORONAVIRUS 2 (TAT 6-24 HRS) Nasopharyngeal Nasopharyngeal Swab     Status: None   Collection Time: 12/14/19  5:44 AM   Specimen: Nasopharyngeal Swab  Result Value Ref Range Status   SARS Coronavirus 2 NEGATIVE NEGATIVE Final    Comment: (NOTE) SARS-CoV-2 target nucleic acids are NOT DETECTED. The SARS-CoV-2 RNA is generally detectable in upper and lower respiratory specimens during the acute phase of infection. Negative results do not preclude SARS-CoV-2 infection, do not rule out co-infections with other pathogens, and should not be used as the sole basis for treatment or other patient management decisions. Negative results must be combined with clinical observations, patient history, and epidemiological information. The expected result is Negative. Fact Sheet for Patients: HairSlick.no Fact Sheet for Healthcare Providers: quierodirigir.com This test is not yet approved or cleared by the [place] FDA and  has been authorized for detection and/or diagnosis of SARS-CoV-2 by FDA under an Emergency Use Authorization (EUA). This EUA will remain  in effect (meaning this test can be used) for the duration of the COVID-19 declaration under Section 56 4(b)(1) of the Act, 21 U.S.C. section 360bbb-3(b)(1), unless the authorization is  terminated or revoked sooner. Performed at [facility]  Lab, [address] [place], [place] [address]   MRSA PCR Screening     Status: None   Collection Time: 12/15/19 12:07 AM   Specimen: Nasopharyngeal  Result Value Ref Range Status   MRSA by PCR NEGATIVE NEGATIVE Final    Comment:        The GeneXpert MRSA Assay (FDA approved for NASAL specimens only), is one component of a comprehensive MRSA colonization surveillance program. It is not intended to diagnose MRSA infection nor to guide or monitor treatment for MRSA infections. Performed at [facility], [address] [place], [place] [address]     Radiology Studies: No results found.    Ryhanna Dunsmore T. Shevette Bess [facility]  If 7PM-7AM, please contact night-coverage [email or web address] Password [email or web address] 12/16/2019, 11:13 AM

## 2019-12-16 NOTE — Plan of Care (Signed)

## 2019-12-16 NOTE — Progress Notes (Signed)
Patient sats dropped to 84%. Patient had slid his trach half way out and oxygen off to the side. This is a room air sat. He says he was trying to take out his passemir valve. Trach replace. Valve taken out and 02 replaced. 02 went back up to 98%. Orders noted to keep 02 at 88 or higher %. Educated patient to not touch his trach and that he will have mittens placed for his safety to not be able to pull at his oxygen as well as his trach. Reported from previous shift that this was done throughout the shift as well.

## 2019-12-16 NOTE — Evaluation (Signed)
Physical Therapy Evaluation Patient Details Name: Tommy Mathews MRN: [id number] DOB: 1961/09/13 Today's Date: 12/16/2019   History of Present Illness  59 year old male with history of COPD s/p trach in 9323, systolic CHF, TBI, DM-2, paroxysmal A. fib, GI bleed, depression, BPD-1, and schizophrenia presented with shortness of breath and hypoxemia to 60%. He was admitted with acute on chronic respiratory failure with hypoxia due to acute on chronic systolic CHF and started on IV Lasix  Clinical Impression  Pt presents to PT with deficits in gait, balance, endurance. PT is unsure how significant pt's current deficits are compared to baseline, pt reporting he is independent at baseline with use of RW, however pt does have prior TBI affecting his cognition. Pt does fatigue with ambulation and currently requires increased supplemental oxygen. Pt is at an increased risk for falls due to cognitive deficits and fatigue with mobility. Pt will benefit from continued acute PT POC and aggressive mobilization to improve activity tolerance and restore the pt's prior level of function.    Follow Up Recommendations SNF(return to facility with PT to improve endurance)    Equipment Recommendations  None recommended by PT(pt has necessary DME at SNF)    Recommendations for Other Services       Precautions / Restrictions Precautions Precautions: Fall Restrictions Weight Bearing Restrictions: No      Mobility  Bed Mobility Overal bed mobility: Needs Assistance Bed Mobility: Supine to Sit     Supine to sit: Supervision        Transfers Overall transfer level: Needs assistance Equipment used: Rolling walker (2 wheeled) Transfers: Sit to/from Stand Sit to Stand: Supervision            Ambulation/Gait Ambulation/Gait assistance: Supervision Gait Distance (Feet): 150 Feet Assistive device: Rolling walker (2 wheeled) Gait Pattern/deviations: Step-through pattern;Trunk flexed Gait velocity:  reduced Gait velocity interpretation: 1.31 - 2.62 ft/sec, indicative of limited community ambulator(initially but slowing with fatigue) General Gait Details: pt with increased trunk flexion with fatigue, stopping briefly for 2 standing rest breaks  Stairs            Wheelchair Mobility    Modified Rankin (Stroke Patients Only)       Balance Overall balance assessment: Needs assistance Sitting-balance support: No upper extremity supported;Feet supported Sitting balance-Leahy Scale: Good Sitting balance - Comments: supervision   Standing balance support: Bilateral upper extremity supported Standing balance-Leahy Scale: Good Standing balance comment: supervision                             Pertinent Vitals/Pain Pain Assessment: No/denies pain    Home Living Family/patient expects to be discharged to:: Skilled nursing facility                 Additional Comments: [facility]    Prior Function Level of Independence: Needs assistance   Gait / Transfers Assistance Needed: pt reports requiring intermittent assist with transfers, but also reports he is independent with ADLs, walks with a RW from bedroom to smoking area without assistance           Hand Dominance   Dominant Hand: Right    Extremity/Trunk Assessment   Upper Extremity Assessment Upper Extremity Assessment: Overall WFL for tasks assessed    Lower Extremity Assessment Lower Extremity Assessment: Overall WFL for tasks assessed    Cervical / Trunk Assessment Cervical / Trunk Assessment: Normal  Communication   Communication: Tracheostomy;[name] valve  Cognition Arousal/Alertness: Awake/alert Behavior  During Therapy: WFL for tasks assessed/performed Overall Cognitive Status: History of cognitive impairments - at baseline                                 General Comments: pt follows commands consistently, does seem to please with responses, denying all symptoms  throughout session although appearing fatigued with ambulation      General Comments General comments (skin integrity, edema, etc.): pt on 10L trach collar 60% FiO2 during at rest, PT transitioning pt to portable O2 tank for ambulation. Pulse ox with poor reading during session, pt desaturating to 60% although reporting no significant SOB. Pt does appear to fatigue with activity but continues to deny SOB. Upon return to recliner and without gripping RW SpO2 reading 93% and above.    Exercises     Assessment/Plan    PT Assessment Patient needs continued PT services  PT Problem List Decreased activity tolerance;Decreased balance;Decreased mobility;Decreased knowledge of use of DME;Cardiopulmonary status limiting activity       PT Treatment Interventions DME instruction;Gait training;Functional mobility training;Therapeutic activities;Therapeutic exercise;Balance training;Neuromuscular re-education;Patient/family education    PT Goals (Current goals can be found in the Care Plan section)  Acute Rehab PT Goals Patient Stated Goal: To improve walking PT Goal Formulation: With patient Time For Goal Achievement: 12/30/19 Potential to Achieve Goals: Good Additional Goals Additional Goal #1: Pt will maintain dynamic standing balance within 10 inches of his base of support with unilateral UE support of the LRAD, modI.    Frequency Min 2X/week   Barriers to discharge        Co-evaluation               AM-PAC PT "6 Clicks" Mobility  Outcome Measure Help needed turning from your back to your side while in a flat bed without using bedrails?: None Help needed moving from lying on your back to sitting on the side of a flat bed without using bedrails?: None Help needed moving to and from a bed to a chair (including a wheelchair)?: None Help needed standing up from a chair using your arms (e.g., wheelchair or bedside chair)?: None Help needed to walk in hospital room?: None Help needed  climbing 3-5 steps with a railing? : A Little 6 Click Score: 23    End of Session Equipment Utilized During Treatment: Oxygen Activity Tolerance: Patient tolerated treatment well Patient left: in chair;with call bell/phone within reach;with chair alarm set Nurse Communication: Mobility status PT Visit Diagnosis: Other (comment);Other abnormalities of gait and mobility (R26.89)(cardiopulmonary endurance deficits)    Time: 8366-2947 PT Time Calculation (min) (ACUTE ONLY): 42 min   Charges:   PT Evaluation $PT Eval Moderate Complexity: 1 Mod PT Treatments $Gait Training: 8-22 mins        Arlyss Gandy, PT, DPT Acute Rehabilitation Pager: [phone]   Arlyss Gandy 12/16/2019, 4:48 PM

## 2019-12-17 LAB — CBC
HCT: 50.9 % (ref 39.0–52.0)
Hemoglobin: 15.9 g/dL (ref 13.0–17.0)
MCH: 27.2 pg (ref 26.0–34.0)
MCHC: 31.2 g/dL (ref 30.0–36.0)
MCV: 87.2 fL (ref 80.0–100.0)
Platelets: 250 10*3/uL (ref 150–400)
RBC: 5.84 MIL/uL — ABNORMAL HIGH (ref 4.22–5.81)
RDW: 15.2 % (ref 11.5–15.5)
WBC: 7.7 10*3/uL (ref 4.0–10.5)
nRBC: 0 % (ref 0.0–0.2)

## 2019-12-17 LAB — RENAL FUNCTION PANEL
Albumin: 2.8 g/dL — ABNORMAL LOW (ref 3.5–5.0)
Anion gap: 11 (ref 5–15)
BUN: 19 mg/dL (ref 6–20)
CO2: 30 mmol/L (ref 22–32)
Calcium: 8.8 mg/dL — ABNORMAL LOW (ref 8.9–10.3)
Chloride: 96 mmol/L — ABNORMAL LOW (ref 98–111)
Creatinine, Ser: 0.99 mg/dL (ref 0.61–1.24)
GFR calc Af Amer: >60 mL/min (ref >60)
GFR calc non Af Amer: >60 mL/min (ref >60)
Glucose, Bld: 130 mg/dL — ABNORMAL HIGH (ref 70–99)
Phosphorus: 3.3 mg/dL (ref 2.5–4.6)
Potassium: 4.2 mmol/L (ref 3.5–5.1)
Sodium: 137 mmol/L (ref 135–145)

## 2019-12-17 LAB — GLUCOSE, CAPILLARY
Glucose-Capillary: 115 mg/dL — ABNORMAL HIGH (ref 70–99)
Glucose-Capillary: 113 mg/dL — ABNORMAL HIGH (ref 70–99)
Glucose-Capillary: 182 mg/dL — ABNORMAL HIGH (ref 70–99)
Glucose-Capillary: 97 mg/dL (ref 70–99)

## 2019-12-17 LAB — MAGNESIUM: Magnesium: 2.1 mg/dL (ref 1.7–2.4)

## 2019-12-17 MED ORDER — FUROSEMIDE 40 MG PO TABS
40.0000 mg | ORAL_TABLET | Freq: Every day | ORAL | Status: DC
  Administered 2019-12-17 – 2019-12-18 (×2): 40 mg via ORAL
  Filled 2019-12-17 (×2): qty 1

## 2019-12-17 NOTE — Plan of Care (Signed)

## 2019-12-17 NOTE — NC FL2 (Addendum)
[place] LEVEL OF CARE SCREENING TOOL     IDENTIFICATION  Patient Name: Tommy Mathews Birthdate: 12-30-1960 Sex: male Admission Date (Current Location): 12/14/2019  [facility] and [place] Number:  Herbalist and Address:  [facility] [place], [address] [place], [place] [address]      Provider Number: [id number]  Attending Physician Name and Address:  Mercy Riding, MD  Relative Name and Phone Number:  Benjamine Mola [phone]    Current Level of Care: SNF Recommended Level of Care: [place] Prior Approval Number:    Date Approved/Denied: 07/29/08 PASRR Number: [id number]  Discharge Plan: SNF    Current Diagnoses: Patient Active Problem List   Diagnosis Date Noted  . Acute on chronic systolic CHF (congestive heart failure) ([place]) 12/14/2019  . COPD (chronic obstructive pulmonary disease) ([place])   . Sepsis with acute hypoxic respiratory failure without septic shock ([place])   . Chronic HFrEF (heart failure with reduced ejection fraction) ([place]) 08/24/2019  . Thrombocytopenia ([place]) 08/24/2019  . Severe recurrent major depression without psychotic features ([place]) 03/03/2018  . Bipolar I disorder, most recent episode depressed ([place]) 03/03/2018  . Schizophrenia ([place]) 02/27/2018  . GI bleed 01/06/2018  . Acute respiratory failure with hypoxia ([place]) 01/06/2018  . Pneumonia of both lungs due to infectious organism 01/06/2018  . Insulin-requiring or dependent type II diabetes mellitus ([place]) 01/06/2018    Orientation RESPIRATION BLADDER Height & Weight     Self, Time, Situation, Place  Tracheostomy(10 liters 60%) Incontinent Weight: 222 lb 10.6 oz (101 kg) Height:  6\' 3"  (190.5 cm)  BEHAVIORAL SYMPTOMS/MOOD NEUROLOGICAL BOWEL NUTRITION STATUS      Continent Diet(See Discharge Summary)  AMBULATORY STATUS COMMUNICATION OF NEEDS Skin   Limited Assist Verbally Other (Comment)(MASD Eczema, Back, Face, and chest, Abdomen,  perinieum, circumferential)                       Personal Care Assistance Level of Assistance  Bathing, Feeding, Dressing Bathing Assistance: Limited assistance Feeding assistance: Independent(able to feed self) Dressing Assistance: Limited assistance     Functional Limitations Info  Sight, Hearing, Speech Sight Info: Adequate Hearing Info: Adequate Speech Info: Adequate    SPECIAL CARE FACTORS FREQUENCY  PT (By licensed PT), OT (By licensed OT)     PT Frequency: 5x min weekly OT Frequency: 5x min weekly            Contractures Contractures Info: Not present    Additional Factors Info  Code Status Code Status Info: FULL             Current Medications (12/17/2019):  This is the current hospital active medication list Current Facility-Administered Medications  Medication Dose Route Frequency Provider Last Rate Last Admin  . 0.9 %  sodium chloride infusion  250 mL Intravenous PRN Opyd, Ilene Qua, MD      . acetaminophen (TYLENOL) tablet 650 mg  650 mg Oral Q4H PRN Opyd, Ilene Qua, MD      . acetylcysteine (MUCOMYST) 20 % nebulizer / oral solution 4 mL  4 mL Nebulization Q12H PRN Opyd, Ilene Qua, MD      . budesonide (PULMICORT) nebulizer solution 0.5 mg  0.5 mg Nebulization BID Opyd, Ilene Qua, MD   0.5 mg at 12/17/19 0836  . chlorhexidine (PERIDEX) 0.12 % solution 15 mL  15 mL Mouth Rinse BID Nita Sells, MD   15 mL at 12/17/19 1009  . clonazepam (KLONOPIN) disintegrating tablet  0.25 mg  0.25 mg Oral BID Rhetta Mura, MD   0.25 mg at 12/17/19 1004  . digoxin (LANOXIN) tablet 0.25 mg  0.25 mg Oral Daily Candelaria Stagers T, MD   0.25 mg at 12/17/19 1005  . divalproex (DEPAKOTE) DR tablet 500 mg  500 mg Oral BID Opyd, Lavone Neri, MD   500 mg at 12/17/19 1006  . enoxaparin (LOVENOX) injection 40 mg  40 mg Subcutaneous Q24H Rhetta Mura, MD   40 mg at 12/17/19 1009  . fluconazole (DIFLUCAN) tablet 100 mg  100 mg Oral Daily Rhetta Mura, MD    100 mg at 12/17/19 1006  . FLUoxetine (PROZAC) capsule 20 mg  20 mg Oral Daily Opyd, Lavone Neri, MD   20 mg at 12/17/19 1005  . furosemide (LASIX) tablet 40 mg  40 mg Oral Daily Candelaria Stagers T, MD   40 mg at 12/17/19 1603  . gabapentin (NEURONTIN) capsule 200 mg  200 mg Oral Q12H Opyd, Lavone Neri, MD   200 mg at 12/17/19 1005  . guaiFENesin (MUCINEX) 12 hr tablet 600 mg  600 mg Oral BID Opyd, Lavone Neri, MD   600 mg at 12/17/19 1005  . insulin aspart (novoLOG) injection 0-9 Units  0-9 Units Subcutaneous TID WC Opyd, Lavone Neri, MD   1 Units at 12/16/19 1301  . insulin glargine (LANTUS) injection 5 Units  5 Units Subcutaneous QHS Opyd, Lavone Neri, MD   5 Units at 12/16/19 2315  . ipratropium-albuterol (DUONEB) 0.5-2.5 (3) MG/3ML nebulizer solution 3 mL  3 mL Nebulization Q4H PRN Opyd, Lavone Neri, MD      . lisinopril (ZESTRIL) tablet 10 mg  10 mg Oral Daily Candelaria Stagers T, MD   10 mg at 12/17/19 1005  . magnesium oxide (MAG-OX) tablet 400 mg  400 mg Oral BID Rhetta Mura, MD   400 mg at 12/17/19 1005  . MEDLINE mouth rinse  15 mL Mouth Rinse q12n4p Rhetta Mura, MD   15 mL at 12/17/19 1603  . metoprolol tartrate (LOPRESSOR) tablet 25 mg  25 mg Oral BID Candelaria Stagers T, MD   25 mg at 12/17/19 1005  . ondansetron (ZOFRAN) injection 4 mg  4 mg Intravenous Q6H PRN Opyd, Lavone Neri, MD      . pantoprazole (PROTONIX) EC tablet 40 mg  40 mg Oral Daily Opyd, Lavone Neri, MD   40 mg at 12/17/19 1005  . QUEtiapine (SEROQUEL) tablet 200 mg  200 mg Oral QHS Opyd, Lavone Neri, MD   200 mg at 12/16/19 2257  . sodium chloride flush (NS) 0.9 % injection 3 mL  3 mL Intravenous Q12H Opyd, Lavone Neri, MD   3 mL at 12/17/19 0810  . sodium chloride flush (NS) 0.9 % injection 3 mL  3 mL Intravenous PRN Opyd, Lavone Neri, MD         Discharge Medications: Please see discharge summary for a list of discharge medications.  Relevant Imaging Results:  Relevant Lab Results:   Additional  Information [id number]  Terrial Rhodes, LCSWA

## 2019-12-17 NOTE — TOC Initial Note (Addendum)
Transition of Care [facility]) - Initial/Assessment Note    Patient Details  Name: Tommy Mathews MRN: [id number] Date of Birth: [birth year]  [facility] CM/SW Contact:    Terrial Rhodes, LCSWA Phone Number: 12/17/2019, 5:08 PM  Clinical Narrative:          CSW spoke with patient by phone. Patient agreed to SNF placement at [facility] CSW sent out referral to [facility]. Patient gave CSW permission to share information with his mother Lanora Manis. CSW completed Assessment and FL2.   TOC team will continue to follow.          Expected Discharge Plan: Skilled Nursing Facility Barriers to Discharge: Continued Medical Work up   Patient Goals and CMS Choice Patient states their goals for this hospitalization and ongoing recovery are:: to go to short term rehab CMS Medicare.gov Compare Post Acute Care list provided to:: Patient Choice offered to / list presented to : Patient  Expected Discharge Plan and Services Expected Discharge Plan: Skilled Nursing Facility       Living arrangements for the past 2 months: Skilled Nursing Facility                                      Prior Living Arrangements/Services Living arrangements for the past 2 months: Skilled Nursing Facility   Patient language and need for interpreter reviewed:: Yes Do you feel safe going back to the place where you live?: Yes   From [facility]  Need for Family Participation in Patient Care: Yes (Comment)(would like his mother to know about his care) Care giver support system in place?: Yes (comment)   Criminal Activity/Legal Involvement Pertinent to Current Situation/Hospitalization: No - Comment as needed  Activities of Daily Living Home Assistive Devices/Equipment: Other (Comment)(patient from SNF) ADL Screening (condition at time of admission) Patient's cognitive ability adequate to safely complete daily activities?: Yes Is the patient deaf or have difficulty hearing?: No Does the  patient have difficulty seeing, even when wearing glasses/contacts?: No Does the patient have difficulty concentrating, remembering, or making decisions?: No Patient able to express need for assistance with ADLs?: Yes Does the patient have difficulty dressing or bathing?: Yes Independently performs ADLs?: Yes (appropriate for developmental age) Does the patient have difficulty walking or climbing stairs?: Yes Weakness of Legs: Both Weakness of Arms/Hands: Both  Permission Sought/Granted Permission sought to share information with : Family Supports, Case Production designer, theatre/television/film, Oceanographer granted to share information with : Yes, Verbal Permission Granted  Share Information with NAME: Lanora Manis  Permission granted to share info w AGENCY: [employer]  Permission granted to share info w Relationship: Mother  Permission granted to share info w Contact Information: Lanora Manis (Mother)  Emotional Assessment Appearance:: Other (Comment Required(unable to access) Attitude/Demeanor/Rapport: Gracious Affect (typically observed): Calm Orientation: : Oriented to Self, Oriented to Place, Oriented to  Time, Oriented to Situation Alcohol / Substance Use: Not Applicable Psych Involvement: No (comment)  Admission diagnosis:  Hypoxia [R09.02] PNA (pneumonia) [J18.9] Heart failure (HCC) [I50.9] Acute on chronic systolic CHF (congestive heart failure) (HCC) [I50.23] Acute on chronic congestive heart failure, unspecified heart failure type [facility]) [I50.9] Patient Active Problem List   Diagnosis Date Noted  . Acute on chronic systolic CHF (congestive heart failure) (HCC) 12/14/2019  . COPD (chronic obstructive pulmonary disease) (HCC)   . Sepsis with acute hypoxic respiratory failure without septic shock (HCC)   . Chronic  HFrEF (heart failure with reduced ejection fraction) ([place]) 08/24/2019  . Thrombocytopenia ([place]) 08/24/2019  . Severe recurrent major depression without psychotic  features ([place]) 03/03/2018  . Bipolar I disorder, most recent episode depressed ([place]) 03/03/2018  . Schizophrenia ([place]) 02/27/2018  . GI bleed 01/06/2018  . Acute respiratory failure with hypoxia ([place]) 01/06/2018  . Pneumonia of both lungs due to infectious organism 01/06/2018  . Insulin-requiring or dependent type II diabetes mellitus ([place]) 01/06/2018   PCP:  Wenda Low, MD Pharmacy:   [facility], [place] [facility] [place] [address] [place] [address] Phone: [phone] Fax: [phone]     Social Determinants of Health (SDOH) Interventions    Readmission Risk Interventions No flowsheet data found.

## 2019-12-17 NOTE — Evaluation (Signed)
Occupational Therapy Evaluation Patient Details Name: Tommy Mathews MRN: [id number] DOB: 06/10/1961 Today's Date: 12/17/2019    History of Present Illness 59 year old male with history of COPD s/p trach in 6294, systolic CHF, TBI, DM-2, paroxysmal A. fib, GI bleed, depression, BPD-1, and schizophrenia presented with shortness of breath and hypoxemia to 60%. He was admitted with acute on chronic respiratory failure with hypoxia due to acute on chronic systolic CHF and started on IV Lasix   Clinical Impression   PTA pt from SNF, states he was using RW to practice walking and was able to complete BADLs. Unsure of accuracy of information given pt is poor historian from baseline cognitive deficits. Pt completed bed mobility and sit <> stands at min guard level with RW. Increased safety cues needed to manage impulsivity. Pt able to tolerate household level of functional mobility on trach collar with 10 L/min and 60% FiO2. Pt needing mod safety cues to pace self with mobility and BADL tasks. Recommend pt return to SNF. Will continue to follow per POC listed below.     Follow Up Recommendations  SNF;Other (comment)(return to SNF)    Equipment Recommendations  None recommended by OT    Recommendations for Other Services       Precautions / Restrictions Precautions Precautions: Fall Precaution Comments: trach at baseline Restrictions Weight Bearing Restrictions: No      Mobility Bed Mobility Overal bed mobility: Needs Assistance Bed Mobility: Supine to Sit     Supine to sit: Min guard     General bed mobility comments: increased time and effort  Transfers Overall transfer level: Needs assistance Equipment used: Rolling walker (2 wheeled) Transfers: Sit to/from Stand Sit to Stand: Min guard              Balance Overall balance assessment: Needs assistance Sitting-balance support: No upper extremity supported;Feet supported Sitting balance-Leahy Scale: Good     Standing  balance support: Bilateral upper extremity supported Standing balance-Leahy Scale: Fair                             ADL either performed or assessed with clinical judgement   ADL Overall ADL's : Needs assistance/impaired Eating/Feeding: Set up;Sitting   Grooming: Set up;Sitting   Upper Body Bathing: Minimal assistance;Sitting   Lower Body Bathing: Minimal assistance;Sit to/from stand   Upper Body Dressing : Minimal assistance;Sitting   Lower Body Dressing: Minimal assistance;Sit to/from stand   Toilet Transfer: Surveyor, minerals Details (indicate cue type and reason): incontinent with urine unless cued for need to use bathroom Toileting- Clothing Manipulation and Hygiene: Minimal assistance       Functional mobility during ADLs: Minimal assistance;Cueing for safety;Rolling walker       Vision Patient Visual Report: No change from baseline       Perception     Praxis      Pertinent Vitals/Pain Pain Assessment: No/denies pain     Hand Dominance     Extremity/Trunk Assessment Upper Extremity Assessment Upper Extremity Assessment: Overall WFL for tasks assessed   Lower Extremity Assessment Lower Extremity Assessment: Defer to PT evaluation       Communication Communication Communication: Tracheostomy;[name] valve   Cognition Arousal/Alertness: Awake/alert Behavior During Therapy: WFL for tasks assessed/performed Overall Cognitive Status: History of cognitive impairments - at baseline  General Comments: pt consistently follows commands, mostly states "yes" when asked questions despite how he may actually be feeling. Stated he was not fatigued with functional mobility, but bent over on forearms on walker taking deep breaths   General Comments       Exercises     Shoulder Instructions      Home Living Family/patient expects to be discharged to:: Skilled nursing  facility                                 Additional Comments: [facility]      Prior Functioning/Environment Level of Independence: Needs assistance  Gait / Transfers Assistance Needed: pt reports requiring intermittent assist with transfers, but also reports he is independent with ADLs, walks with a RW from bedroom to smoking area without assistance              OT Problem List: Decreased strength;Decreased activity tolerance;Decreased cognition;Cardiopulmonary status limiting activity;Impaired balance (sitting and/or standing);Decreased safety awareness      OT Treatment/Interventions: Self-care/ADL training;Therapeutic exercise;Patient/family education;Balance training;Energy conservation;Therapeutic activities;DME and/or AE instruction;Cognitive remediation/compensation    OT Goals(Current goals can be found in the care plan section) Acute Rehab OT Goals Patient Stated Goal: To improve walking OT Goal Formulation: With patient Time For Goal Achievement: 12/31/19 Potential to Achieve Goals: Good  OT Frequency: Min 2X/week   Barriers to D/C:            Co-evaluation              AM-PAC OT "6 Clicks" Daily Activity     Outcome Measure Help from another person eating meals?: A Little Help from another person taking care of personal grooming?: A Little Help from another person toileting, which includes using toliet, bedpan, or urinal?: A Little Help from another person bathing (including washing, rinsing, drying)?: A Little Help from another person to put on and taking off regular upper body clothing?: A Little Help from another person to put on and taking off regular lower body clothing?: A Little 6 Click Score: 18   End of Session Equipment Utilized During Treatment: Gait belt;Rolling walker;Oxygen Nurse Communication: Mobility status  Activity Tolerance: Patient tolerated treatment well Patient left: in chair;with call bell/phone within reach;with  chair alarm set  OT Visit Diagnosis: Unsteadiness on feet (R26.81);Other abnormalities of gait and mobility (R26.89);Other symptoms and signs involving cognitive function                Time: 3532-9924 OT Time Calculation (min): 30 min Charges:  OT General Charges $OT Visit: 1 Visit OT Evaluation $OT Eval Moderate Complexity: 1 Mod OT Treatments $Self Care/Home Management : 8-22 mins  Dalphine Handing, MSOT, OTR/L Acute Rehabilitation Services [facility] Office Number: [phone] Pager: [phone]    Dalphine Handing 12/17/2019, 5:56 PM

## 2019-12-17 NOTE — Progress Notes (Signed)
PROGRESS NOTE  Tommy Mathews PPJ:[id number] DOB: [birth year]   PCP: Georgann Housekeeper, MD  Patient is from: [facility]  DOA: 12/14/2019 LOS: 3  Brief Narrative / Interim history: 59 year old male with history of COPD s/p trach in 2019, systolic CHF, TBI, DM-2, paroxysmal A. fib, GI bleed, depression, BPD-1, and schizophrenia presented with shortness of breath and hypoxemia to 60%.  He was admitted with acute on chronic respiratory failure with hypoxia due to acute on chronic systolic CHF and started on IV Lasix.  COVID-19 was negative.  Subjective: Seen and examined this morning.  No major events overnight or this morning.  No complaints.  He denies chest pain, dyspnea or abdominal pain.  Good response to IV Lasix.  Had about 2 L urine output overnight.  Renal function stable.  Objective: Vitals:   12/17/19 0804 12/17/19 0810 12/17/19 0836 12/17/19 1006  BP: 109/79   (!) 122/91  Pulse: 60 66 63 65  Resp: 11 13 18    Temp: 98.1 F (36.7 C)     TempSrc: Oral     SpO2: 92%  90% 97%  Weight:      Height:        Intake/Output Summary (Last 24 hours) at 12/17/2019 1026 Last data filed at 12/17/2019 0900 Gross per 24 hour  Intake 926 ml  Output 2350 ml  Net -1424 ml   Filed Weights   12/15/19 0500 12/16/19 0600 12/17/19 0400  Weight: 102 kg 101.5 kg 101 kg    Examination:  GENERAL: No acute distress.  Appears well.  HEENT: MMM.  Vision and hearing grossly intact.  NECK: Trach collar in place. RESP: 93% on 3 L / 60% via trach.  No IWOB.  Fair aeration bilaterally. CVS:  RRR. Heart sounds normal.  ABD/GI/GU: Bowel sounds present. Soft. Non tender.  MSK/EXT:  Moves extremities. No apparent deformity. No edema.  SKIN: no apparent skin lesion or wound NEURO: Awake, alert and responds to questions by nodding.  No apparent focal neuro deficit. PSYCH: Calm. Normal affect.  Procedures:  None  Assessment & Plan: Acute on chronic respiratory failure with hypoxia in patient  with tracheostomy: due to systolic CHF exacerbation. -Wean oxygen as able to home level. -Treat CHF as below.  -Breathing treatments and tracheostomy care per respiratory therapy  Acute on chronic systolic CHF with recovered LV function: Echo with EF of 55 to 60% (20 to 25% in 2019), G-2DD and no other significant finding.  Responded to IV Lasix.  -Transition to oral Lasix 40 mg daily.  On 20 mg daily POA. -Monitor fluid status, renal function and electrolytes -Sodium and fluid restriction  History of TBI with tracheostomy-stable.  -PT/OT  Consult DM-2 with hyperglycemia: A1c 6.9%. Recent Labs    12/16/19 1636 12/16/19 2128 12/17/19 0634  GLUCAP 105* 91 115*  -Continue current regimen with a statin.  Paroxysmal A. fib: On metoprolol and digoxin at home.  CHA2DS2-VASc score > 4 but history of GI bleed. -Continue metoprolol and digoxin  History of GI bleed/GERD -Continue PPI  Depression/BPD-1/schizophrenia: Stable. -Continue home meds-Seroquel, Prozac, Depakote, Klonopin               DVT prophylaxis: Subcu Lovenox Code Status: Full code Family Communication: Patient and/or RN. Available if any question.   Discharge barrier: Acute on chronic respiratory failure due to CHF exacerbation. Patient is from: SNF Final disposition: SNF likely in the next 24 hours if stable on current diuretics.  Consultants: None   Microbiology summarized: 3/15-COVID-19  PCR negative 3/15-urine culture multiple species 3/15-blood cultures NGTD  Sch Meds:  Scheduled Meds: . budesonide  0.5 mg Nebulization BID  . chlorhexidine  15 mL Mouth Rinse BID  . clonazePAM  0.25 mg Oral BID  . digoxin  0.25 mg Oral Daily  . divalproex  500 mg Oral BID  . enoxaparin (LOVENOX) injection  40 mg Subcutaneous Q24H  . fluconazole  100 mg Oral Daily  . FLUoxetine  20 mg Oral Daily  . furosemide  40 mg Oral Daily  . gabapentin  200 mg Oral Q12H  . guaiFENesin  600 mg Oral BID  . insulin aspart   0-9 Units Subcutaneous TID WC  . insulin glargine  5 Units Subcutaneous QHS  . lisinopril  10 mg Oral Daily  . magnesium oxide  400 mg Oral BID  . mouth rinse  15 mL Mouth Rinse q12n4p  . metoprolol tartrate  25 mg Oral BID  . pantoprazole  40 mg Oral Daily  . QUEtiapine  200 mg Oral QHS  . sodium chloride flush  3 mL Intravenous Q12H   Continuous Infusions: . sodium chloride     PRN Meds:.sodium chloride, acetaminophen, acetylcysteine, ipratropium-albuterol, ondansetron (ZOFRAN) IV, sodium chloride flush  Antimicrobials: Anti-infectives (From admission, onward)   Start     Dose/Rate Route Frequency Ordered Stop   12/14/19 1000  fluconazole (DIFLUCAN) tablet 100 mg     100 mg Oral Daily 12/14/19 0902 12/28/19 0959       I have personally reviewed the following labs and images: CBC: Recent Labs  Lab 12/14/19 0240 12/14/19 0327 12/15/19 0122 12/16/19 0245 12/17/19 0243  WBC 10.3  --  9.9 7.3 7.7  NEUTROABS 5.8  --  5.4 3.6  --   HGB 14.9 15.3 14.9 15.3 15.9  HCT 49.8 45.0 48.6 49.8 50.9  MCV 90.7  --  88.8 88.3 87.2  PLT 221  --  229 240 250   BMP &GFR Recent Labs  Lab 12/14/19 0240 12/14/19 0327 12/15/19 0122 12/16/19 0245 12/17/19 0243  NA 139 140 139 137 137  K 4.1 3.7 4.0 3.8 4.2  CL 101  --  99 97* 96*  CO2 28  --  26 31 30   GLUCOSE 96  --  156* 120* 130*  BUN 12  --  17 15 19   CREATININE 1.12  --  1.13 1.04 0.99  CALCIUM 8.8*  --  8.5* 8.6* 8.8*  MG  --   --   --   --  2.1  PHOS  --   --   --   --  3.3   Estimated Creatinine Clearance: 98.4 mL/min (by C-G formula based on SCr of 0.99 mg/dL). Liver & Pancreas: Recent Labs  Lab 12/14/19 0240 12/17/19 0243  AST 14*  --   ALT 9  --   ALKPHOS 54  --   BILITOT 0.5  --   PROT 6.7  --   ALBUMIN 3.0* 2.8*   No results for input(s): LIPASE, AMYLASE in the last 168 hours. No results for input(s): AMMONIA in the last 168 hours. Diabetic: Recent Labs    12/15/19 0122  HGBA1C 6.9*   Recent Labs    Lab 12/16/19 0642 12/16/19 1257 12/16/19 1636 12/16/19 2128 12/17/19 0634  GLUCAP 88 140* 105* 91 115*   Cardiac Enzymes: No results for input(s): CKTOTAL, CKMB, CKMBINDEX, TROPONINI in the last 168 hours. No results for input(s): PROBNP in the last 8760 hours. Coagulation Profile: Recent Labs  Lab 12/14/19 0240  INR 1.0   Thyroid Function Tests: No results for input(s): TSH, T4TOTAL, FREET4, T3FREE, THYROIDAB in the last 72 hours. Lipid Profile: No results for input(s): CHOL, HDL, LDLCALC, TRIG, CHOLHDL, LDLDIRECT in the last 72 hours. Anemia Panel: No results for input(s): VITAMINB12, FOLATE, FERRITIN, TIBC, IRON, RETICCTPCT in the last 72 hours. Urine analysis:    Component Value Date/Time   COLORURINE YELLOW 12/14/2019 0430   APPEARANCEUR CLEAR 12/14/2019 0430   LABSPEC 1.029 12/14/2019 0430   PHURINE 5.0 12/14/2019 0430   GLUCOSEU NEGATIVE 12/14/2019 0430   HGBUR NEGATIVE 12/14/2019 0430   BILIRUBINUR SMALL (A) 12/14/2019 0430   KETONESUR 5 (A) 12/14/2019 0430   PROTEINUR 30 (A) 12/14/2019 0430   NITRITE NEGATIVE 12/14/2019 0430   LEUKOCYTESUR TRACE (A) 12/14/2019 0430   Sepsis Labs: Invalid input(s): PROCALCITONIN, LACTICIDVEN  Microbiology: Recent Results (from the past 240 hour(s))  Blood Culture (routine x 2)     Status: None (Preliminary result)   Collection Time: 12/14/19  2:57 AM   Specimen: BLOOD LEFT HAND  Result Value Ref Range Status   Specimen Description BLOOD LEFT HAND  Final   Special Requests   Final    BOTTLES DRAWN AEROBIC AND ANAEROBIC Blood Culture results may not be optimal due to an excessive volume of blood received in culture bottles   Culture   Final    NO GROWTH 3 DAYS Performed at [facility], [address], [place], [place] [address]    Report Status PENDING  Incomplete  Urine culture     Status: Abnormal   Collection Time: 12/14/19  2:57 AM   Specimen: In/Out Cath Urine  Result Value Ref Range Status   Specimen  Description IN/OUT CATH URINE  Final   Special Requests   Final    NONE Performed at [facility], [address] [place], [place] [address]    Culture MULTIPLE SPECIES PRESENT, SUGGEST RECOLLECTION (A)  Final   Report Status 12/15/2019 FINAL  Final  Blood Culture (routine x 2)     Status: None (Preliminary result)   Collection Time: 12/14/19  3:02 AM   Specimen: BLOOD  Result Value Ref Range Status   Specimen Description BLOOD RIGHT ANTECUBITAL  Final   Special Requests   Final    BOTTLES DRAWN AEROBIC AND ANAEROBIC Blood Culture adequate volume   Culture   Final    NO GROWTH 3 DAYS Performed at [facility], [address], [place], [place] [address]    Report Status PENDING  Incomplete  SARS CORONAVIRUS 2 (TAT 6-24 HRS) Nasopharyngeal Nasopharyngeal Swab     Status: None   Collection Time: 12/14/19  5:44 AM   Specimen: Nasopharyngeal Swab  Result Value Ref Range Status   SARS Coronavirus 2 NEGATIVE NEGATIVE Final    Comment: (NOTE) SARS-CoV-2 target nucleic acids are NOT DETECTED. The SARS-CoV-2 RNA is generally detectable in upper and lower respiratory specimens during the acute phase of infection. Negative results do not preclude SARS-CoV-2 infection, do not rule out co-infections with other pathogens, and should not be used as the sole basis for treatment or other patient management decisions. Negative results must be combined with clinical observations, patient history, and epidemiological information. The expected result is Negative. Fact Sheet for Patients: HairSlick.no Fact Sheet for Healthcare Providers: quierodirigir.com This test is not yet approved or cleared by the [place] FDA and  has been authorized for detection and/or diagnosis of SARS-CoV-2 by FDA  under an Emergency Use Authorization (EUA). This EUA will remain  in effect (meaning this test can be used) for the duration of  the COVID-19 declaration under Section 56 4(b)(1) of the Act, 21 U.S.C. section 360bbb-3(b)(1), unless the authorization is terminated or revoked sooner. Performed at [facility], [address], [place], [place] [address]   MRSA PCR Screening     Status: None   Collection Time: 12/15/19 12:07 AM   Specimen: Nasopharyngeal  Result Value Ref Range Status   MRSA by PCR NEGATIVE NEGATIVE Final    Comment:        The GeneXpert MRSA Assay (FDA approved for NASAL specimens only), is one component of a comprehensive MRSA colonization surveillance program. It is not intended to diagnose MRSA infection nor to guide or monitor treatment for MRSA infections. Performed at [facility], [address] [place], [place] [address]     Radiology Studies: No results found.    Tommy Mathews T. Tommy Mathews [facility]  If 7PM-7AM, please contact night-coverage [email or web address] Password [facility] 12/17/2019, 10:26 AM

## 2019-12-18 LAB — BASIC METABOLIC PANEL
Anion gap: 11 (ref 5–15)
BUN: 19 mg/dL (ref 6–20)
CO2: 30 mmol/L (ref 22–32)
Calcium: 8.7 mg/dL — ABNORMAL LOW (ref 8.9–10.3)
Chloride: 96 mmol/L — ABNORMAL LOW (ref 98–111)
Creatinine, Ser: 1.15 mg/dL (ref 0.61–1.24)
GFR calc Af Amer: >60 mL/min (ref >60)
GFR calc non Af Amer: >60 mL/min (ref >60)
Glucose, Bld: 133 mg/dL — ABNORMAL HIGH (ref 70–99)
Potassium: 4.3 mmol/L (ref 3.5–5.1)
Sodium: 137 mmol/L (ref 135–145)

## 2019-12-18 LAB — CBC
HCT: 52.6 % — ABNORMAL HIGH (ref 39.0–52.0)
Hemoglobin: 16 g/dL (ref 13.0–17.0)
MCH: 26.9 pg (ref 26.0–34.0)
MCHC: 30.4 g/dL (ref 30.0–36.0)
MCV: 88.4 fL (ref 80.0–100.0)
Platelets: 269 10*3/uL (ref 150–400)
RBC: 5.95 MIL/uL — ABNORMAL HIGH (ref 4.22–5.81)
RDW: 15.2 % (ref 11.5–15.5)
WBC: 7.7 10*3/uL (ref 4.0–10.5)
nRBC: 0 % (ref 0.0–0.2)

## 2019-12-18 LAB — SARS CORONAVIRUS 2 (TAT 6-24 HRS): SARS Coronavirus 2: NEGATIVE

## 2019-12-18 LAB — GLUCOSE, CAPILLARY
Glucose-Capillary: 111 mg/dL — ABNORMAL HIGH (ref 70–99)
Glucose-Capillary: 121 mg/dL — ABNORMAL HIGH (ref 70–99)
Glucose-Capillary: 137 mg/dL — ABNORMAL HIGH (ref 70–99)
Glucose-Capillary: 123 mg/dL — ABNORMAL HIGH (ref 70–99)

## 2019-12-18 LAB — MAGNESIUM: Magnesium: 2.1 mg/dL (ref 1.7–2.4)

## 2019-12-18 MED ORDER — FUROSEMIDE 40 MG PO TABS
40.0000 mg | ORAL_TABLET | Freq: Two times a day (BID) | ORAL | Status: DC
  Administered 2019-12-18 – 2019-12-20 (×4): 40 mg via ORAL
  Filled 2019-12-18 (×4): qty 1

## 2019-12-18 NOTE — Progress Notes (Signed)
PROGRESS NOTE  Tommy Mathews PNT:[id number] DOB: [birth year]   PCP: Georgann Housekeeper, MD  Patient is from: [facility]  DOA: 12/14/2019 LOS: 4  Brief Narrative / Interim history: 59 year old male with history of COPD s/p trach in 2019 not on oxygen at baseline, systolic CHF, TBI, DM-2, paroxysmal A. fib, GI bleed, depression, BPD-1, and schizophrenia presented with shortness of breath and hypoxemia to 60%.  He was admitted with acute on chronic respiratory failure with hypoxia due to acute on chronic systolic CHF and started on IV Lasix with good response. He was eventually transitioned to oral lasix and continued to do well.  COVID-19 was negative.  Subjective: Seen and examined earlier this morning.  No major events overnight or this morning.  No complaints.  He denies chest pain, shortness of breath or GI symptoms.  Still on 8 L / 35% FiO2.  Good urine output, about 1 L / 24 hours.  Objective: Vitals:   12/18/19 0806 12/18/19 0812 12/18/19 0820 12/18/19 1041  BP: 101/63  101/63 120/69  Pulse: 68  66 66  Resp: 14  17 15   Temp: 98.6 F (37 C)   98.1 F (36.7 C)  TempSrc: Oral   Oral  SpO2: 91% 91% 98% 96%  Weight:      Height:        Intake/Output Summary (Last 24 hours) at 12/18/2019 1107 Last data filed at 12/18/2019 1045 Gross per 24 hour  Intake 1260 ml  Output 1075 ml  Net 185 ml   Filed Weights   12/16/19 0600 12/17/19 0400 12/18/19 0340  Weight: 101.5 kg 101 kg 104 kg    Examination:  GENERAL: No acute distress.  Appears well.  HEENT: MMM.  Vision and hearing grossly intact.  NECK: Trach collar in place. RESP: 92% on 8 L / 35% FiO2.   No IWOB.  Rhonchi bilaterally. CVS:  RRR. Heart sounds normal.  ABD/GI/GU: Bowel sounds present. Soft. Non tender.  MSK/EXT:  Moves extremities. No apparent deformity. No edema.  SKIN: no apparent skin lesion or wound NEURO: Awake and alert.  Seems to be fairly oriented.  Responds to questions by nodding.  No apparent focal  neuro deficit. PSYCH: Calm. Normal affect.  Procedures:  None  Assessment & Plan: Acute on chronic respiratory failure with hypoxia in patient with tracheostomy:  Due to systolic CHF exacerbation.  Patient is not on oxygen prior to arrival. -Wean oxygen to room air as able -Treat CHF as below.  -Breathing treatments and tracheostomy care per respiratory therapy  Acute on chronic systolic CHF with recovered LV function: Echo with EF of 55 to 60% (20 to 25% in 2019), G-2DD and no other significant finding.  About 1 L urine output in the last 24 hours.  Refused lab draw this morning.  -Transition to oral Lasix 40 mg twice daily.  On 20 mg daily POA.  May have to go back to IV Lasix if no good diuresis with p.o. -Monitor fluid status, renal function and electrolytes -Sodium and fluid restriction  History of TBI with tracheostomy-stable.  -PT/OT  Consult DM-2 with hyperglycemia: A1c 6.9%. Recent Labs    12/17/19 1607 12/17/19 2150 12/18/19 0623  GLUCAP 182* 97 111*  -Continue current regimen with a statin.  Paroxysmal A. fib: On metoprolol and digoxin at home.  CHA2DS2-VASc score > 4 but history of GI bleed. -Continue metoprolol and digoxin  History of GI bleed/GERD -Continue PPI  Depression/BPD-1/schizophrenia: Stable. -Continue home meds-Seroquel, Prozac, Depakote, Klonopin  DVT prophylaxis: Subcu Lovenox Code Status: Full code Family Communication: Updated patient's mother over the phone.  Discharge barrier: Acute on chronic respiratory failure requiring supplemental oxygen due to CHF exacerbation. Patient is from: SNF Final disposition: SNF when able to liberate from oxygen  Consultants: None   Microbiology summarized: 3/15-COVID-19 PCR negative 3/15-urine culture multiple species 3/15-blood cultures NGTD  Sch Meds:  Scheduled Meds: . budesonide  0.5 mg Nebulization BID  . chlorhexidine  15 mL Mouth Rinse BID  . clonazePAM  0.25 mg Oral  BID  . digoxin  0.25 mg Oral Daily  . divalproex  500 mg Oral BID  . enoxaparin (LOVENOX) injection  40 mg Subcutaneous Q24H  . fluconazole  100 mg Oral Daily  . FLUoxetine  20 mg Oral Daily  . furosemide  40 mg Oral BID  . gabapentin  200 mg Oral Q12H  . guaiFENesin  600 mg Oral BID  . insulin aspart  0-9 Units Subcutaneous TID WC  . insulin glargine  5 Units Subcutaneous QHS  . lisinopril  10 mg Oral Daily  . magnesium oxide  400 mg Oral BID  . mouth rinse  15 mL Mouth Rinse q12n4p  . metoprolol tartrate  25 mg Oral BID  . pantoprazole  40 mg Oral Daily  . QUEtiapine  200 mg Oral QHS  . sodium chloride flush  3 mL Intravenous Q12H   Continuous Infusions: . sodium chloride     PRN Meds:.sodium chloride, acetaminophen, acetylcysteine, ipratropium-albuterol, ondansetron (ZOFRAN) IV, sodium chloride flush  Antimicrobials: Anti-infectives (From admission, onward)   Start     Dose/Rate Route Frequency Ordered Stop   12/14/19 1000  fluconazole (DIFLUCAN) tablet 100 mg     100 mg Oral Daily 12/14/19 0902 12/28/19 0959       I have personally reviewed the following labs and images: CBC: Recent Labs  Lab 12/14/19 0240 12/14/19 0327 12/15/19 0122 12/16/19 0245 12/17/19 0243  WBC 10.3  --  9.9 7.3 7.7  NEUTROABS 5.8  --  5.4 3.6  --   HGB 14.9 15.3 14.9 15.3 15.9  HCT 49.8 45.0 48.6 49.8 50.9  MCV 90.7  --  88.8 88.3 87.2  PLT 221  --  229 240 250   BMP &GFR Recent Labs  Lab 12/14/19 0240 12/14/19 0327 12/15/19 0122 12/16/19 0245 12/17/19 0243  NA 139 140 139 137 137  K 4.1 3.7 4.0 3.8 4.2  CL 101  --  99 97* 96*  CO2 28  --  26 31 30   GLUCOSE 96  --  156* 120* 130*  BUN 12  --  17 15 19   CREATININE 1.12  --  1.13 1.04 0.99  CALCIUM 8.8*  --  8.5* 8.6* 8.8*  MG  --   --   --   --  2.1  PHOS  --   --   --   --  3.3   Estimated Creatinine Clearance: 107.5 mL/min (by C-G formula based on SCr of 0.99 mg/dL). Liver & Pancreas: Recent Labs  Lab 12/14/19 0240  12/17/19 0243  AST 14*  --   ALT 9  --   ALKPHOS 54  --   BILITOT 0.5  --   PROT 6.7  --   ALBUMIN 3.0* 2.8*   No results for input(s): LIPASE, AMYLASE in the last 168 hours. No results for input(s): AMMONIA in the last 168 hours. Diabetic: No results for input(s): HGBA1C in the last 72 hours. Recent Labs  Lab  12/17/19 0634 12/17/19 1113 12/17/19 1607 12/17/19 2150 12/18/19 0623  GLUCAP 115* 113* 182* 97 111*   Cardiac Enzymes: No results for input(s): CKTOTAL, CKMB, CKMBINDEX, TROPONINI in the last 168 hours. No results for input(s): PROBNP in the last 8760 hours. Coagulation Profile: Recent Labs  Lab 12/14/19 0240  INR 1.0   Thyroid Function Tests: No results for input(s): TSH, T4TOTAL, FREET4, T3FREE, THYROIDAB in the last 72 hours. Lipid Profile: No results for input(s): CHOL, HDL, LDLCALC, TRIG, CHOLHDL, LDLDIRECT in the last 72 hours. Anemia Panel: No results for input(s): VITAMINB12, FOLATE, FERRITIN, TIBC, IRON, RETICCTPCT in the last 72 hours. Urine analysis:    Component Value Date/Time   COLORURINE YELLOW 12/14/2019 0430   APPEARANCEUR CLEAR 12/14/2019 0430   LABSPEC 1.029 12/14/2019 0430   PHURINE 5.0 12/14/2019 0430   GLUCOSEU NEGATIVE 12/14/2019 0430   HGBUR NEGATIVE 12/14/2019 0430   BILIRUBINUR SMALL (A) 12/14/2019 0430   KETONESUR 5 (A) 12/14/2019 0430   PROTEINUR 30 (A) 12/14/2019 0430   NITRITE NEGATIVE 12/14/2019 0430   LEUKOCYTESUR TRACE (A) 12/14/2019 0430   Sepsis Labs: Invalid input(s): PROCALCITONIN, LACTICIDVEN  Microbiology: Recent Results (from the past 240 hour(s))  Blood Culture (routine x 2)     Status: None (Preliminary result)   Collection Time: 12/14/19  2:57 AM   Specimen: BLOOD LEFT HAND  Result Value Ref Range Status   Specimen Description BLOOD LEFT HAND  Final   Special Requests   Final    BOTTLES DRAWN AEROBIC AND ANAEROBIC Blood Culture results may not be optimal due to an excessive volume of blood received in  culture bottles   Culture   Final    NO GROWTH 4 DAYS Performed at [facility], [address], [place], [place] [address]    Report Status PENDING  Incomplete  Urine culture     Status: Abnormal   Collection Time: 12/14/19  2:57 AM   Specimen: In/Out Cath Urine  Result Value Ref Range Status   Specimen Description IN/OUT CATH URINE  Final   Special Requests   Final    NONE Performed at [facility], [address], [place], [place] [address]    Culture MULTIPLE SPECIES PRESENT, SUGGEST RECOLLECTION (A)  Final   Report Status 12/15/2019 FINAL  Final  Blood Culture (routine x 2)     Status: None (Preliminary result)   Collection Time: 12/14/19  3:02 AM   Specimen: BLOOD  Result Value Ref Range Status   Specimen Description BLOOD RIGHT ANTECUBITAL  Final   Special Requests   Final    BOTTLES DRAWN AEROBIC AND ANAEROBIC Blood Culture adequate volume   Culture   Final    NO GROWTH 4 DAYS Performed at [facility], [address], [place], [place] [address]    Report Status PENDING  Incomplete  SARS CORONAVIRUS 2 (TAT 6-24 HRS) Nasopharyngeal Nasopharyngeal Swab     Status: None   Collection Time: 12/14/19  5:44 AM   Specimen: Nasopharyngeal Swab  Result Value Ref Range Status   SARS Coronavirus 2 NEGATIVE NEGATIVE Final    Comment: (NOTE) SARS-CoV-2 target nucleic acids are NOT DETECTED. The SARS-CoV-2 RNA is generally detectable in upper and lower respiratory specimens during the acute phase of infection. Negative results do not preclude SARS-CoV-2 infection, do not rule out co-infections with other pathogens, and should not be used as the sole basis for treatment or other patient management decisions. Negative results must be combined with  clinical observations, patient history, and epidemiological information. The expected result is Negative. Fact Sheet for Patients: HairSlick.no Fact Sheet for Healthcare  Providers: quierodirigir.com This test is not yet approved or cleared by the [place] FDA and  has been authorized for detection and/or diagnosis of SARS-CoV-2 by FDA under an Emergency Use Authorization (EUA). This EUA will remain  in effect (meaning this test can be used) for the duration of the COVID-19 declaration under Section 56 4(b)(1) of the Act, 21 U.S.C. section 360bbb-3(b)(1), unless the authorization is terminated or revoked sooner. Performed at [facility], [address], [place], [place] [address]   MRSA PCR Screening     Status: None   Collection Time: 12/15/19 12:07 AM   Specimen: Nasopharyngeal  Result Value Ref Range Status   MRSA by PCR NEGATIVE NEGATIVE Final    Comment:        The GeneXpert MRSA Assay (FDA approved for NASAL specimens only), is one component of a comprehensive MRSA colonization surveillance program. It is not intended to diagnose MRSA infection nor to guide or monitor treatment for MRSA infections. Performed at [facility], [address], [place], [place] [address]   SARS CORONAVIRUS 2 (TAT 6-24 HRS) Nasopharyngeal Nasopharyngeal Swab     Status: None   Collection Time: 12/17/19  7:00 PM   Specimen: Nasopharyngeal Swab  Result Value Ref Range Status   SARS Coronavirus 2 NEGATIVE NEGATIVE Final    Comment: (NOTE) SARS-CoV-2 target nucleic acids are NOT DETECTED. The SARS-CoV-2 RNA is generally detectable in upper and lower respiratory specimens during the acute phase of infection. Negative results do not preclude SARS-CoV-2 infection, do not rule out co-infections with other pathogens, and should not be used as the sole basis for treatment or other patient management decisions. Negative results must be combined with clinical observations, patient history, and epidemiological information. The expected result is Negative. Fact Sheet for Patients: HairSlick.no Fact  Sheet for Healthcare Providers: quierodirigir.com This test is not yet approved or cleared by the [place] FDA and  has been authorized for detection and/or diagnosis of SARS-CoV-2 by FDA under an Emergency Use Authorization (EUA). This EUA will remain  in effect (meaning this test can be used) for the duration of the COVID-19 declaration under Section 56 4(b)(1) of the Act, 21 U.S.C. section 360bbb-3(b)(1), unless the authorization is terminated or revoked sooner. Performed at [facility], [address] [place], [place] [address]     Radiology Studies: No results found.    Laterica Matarazzo T. [name] [facility]  If 7PM-7AM, please contact night-coverage [email or web address] Password [facility] 12/18/2019, 11:07 AM

## 2019-12-18 NOTE — Evaluation (Signed)
[name] Speaking Valve - Evaluation Patient Details  Name: Tommy Mathews MRN: [id number] Date of Birth: 24-Jan-1961  Today's Date: 12/18/2019 Time: 1532-1600 SLP Time Calculation (min) (ACUTE ONLY): 28 min  Past Medical History:  Past Medical History:  Diagnosis Date  . Diabetes (HCC)   . GERD (gastroesophageal reflux disease)   . HLD (hyperlipidemia)   . HTN (hypertension)    Past Surgical History:  Past Surgical History:  Procedure Laterality Date  . IR GASTROSTOMY TUBE REMOVAL  08/15/2018  . PEG PLACEMENT N/A 01/21/2018   Procedure: PERCUTANEOUS ENDOSCOPIC GASTROSTOMY (PEG) PLACEMENT;  Surgeon: Wyline Mood, MD;  Location: [facility] ENDOSCOPY;  Service: Gastroenterology;  Laterality: N/A;  . TRACHEOSTOMY TUBE PLACEMENT N/A 01/23/2018   Procedure: TRACHEOSTOMY;  Surgeon: Bud Face, MD;  Location: [facility] ORS;  Service: ENT;  Laterality: N/A;   HPI:  Pt 59 year old male with history of COPD s/p trach in 2019, systolic CHF, TBI, DM-2, paroxysmal A. fib, GI bleed, chronic trach depression, BPD-1, and schizophrenia presented with shortness of breath and hypoxemia to 60%. He was admitted with acute on chronic respiratory failure with hypoxia due to acute on chronic systolic CHF and started on IV Lasix. SLP was consulted for evaluation since pt's [name] Speaking Valve was lost today.    Assessment / Plan / Recommendation Clinical Impression  Pt was seen for PMSV evaluation. Pt reported that he has been independent with his trach and PMSV care since 2019 when he was last seen by speech pathology. Cuff was deflated upon SLP's arrival. He presented with vitals of RR 16  SpO2 97, and HR 64 at the onset of the session. Pt tolerated finger occlusion without evidence of air trapping. He was able to sustain phonation with finger occlusion. He tolerated PMSV for 28 minutes with vitals ranging RR 14-24, SpO2 95-99, and HR 55-70. Vocal quality was within normal limits throughout the evaluation and  he denied respiratory difficulty. Pt independently demonstrated placement and removal of PMSV and was able to verbalize the steps necessary to cleaning. Further skilled SLP services are not clinically indicated at this time. Pt and nursing were advised of this and both parties verbalized understanding as well as agreement with plan of care.  SLP Visit Diagnosis: Aphonia (R49.1)    SLP Assessment  Patient does not need any further Speech Lanaguage Pathology Services    Follow Up Recommendations  None    Frequency and Duration         PMSV Trial PMSV was placed for: 28 Able to redirect subglottic air through upper airway: Yes Able to Attain Phonation: Yes Voice Quality: Normal Able to Expectorate Secretions: No attempts Breath Support for Phonation: Adequate Intelligibility: Intelligible Respirations During Trial: (14-24) SpO2 During Trial: (95-99) Pulse During Trial: (55-70) Behavior: Alert;Cooperative;Expresses self well;Good eye contact;Responsive to questions   Tracheostomy Tube       Vent Dependency  FiO2 (%): 28 %    Cuff Deflation Trial      Emeline Simpson I. Vear Clock, MS, CCC-SLP Acute Rehabilitation Services Office number [phone] Pager [phone]  Tolerated Cuff Deflation: Yes Length of Time for Cuff Deflation Trial: 28 Behavior: Alert;Cooperative;Expresses self well;Good eye contact;Responsive to questions Cuff Deflation Trial - Comments: see impressions        Scheryl Marten 12/18/2019, 5:32 PM

## 2019-12-18 NOTE — Progress Notes (Signed)
Patient refused labs this morning. Patient educated on the importance of labs. On-call provider paged and updated.

## 2019-12-18 NOTE — Progress Notes (Signed)
Physical Therapy Treatment Patient Details Name: Tommy Mathews MRN: [id number] DOB: August 15, 1961 Today's Date: 12/18/2019    History of Present Illness 59 year old male with history of COPD s/p trach in 2019, systolic CHF, TBI, DM-2, paroxysmal A. fib, GI bleed, depression, BPD-1, and schizophrenia presented with shortness of breath and hypoxemia to 60%. He was admitted with acute on chronic respiratory failure with hypoxia due to acute on chronic systolic CHF and started on IV Lasix    PT Comments    Patient seen for mobility progression. O2 decreased by RT before ambulating and SpO2 maintained 92-95% on 4-5L trach collar. Continue to progress as tolerated with anticipated d/c to SNF for further skilled PT services.     Follow Up Recommendations  SNF(return to facility with PT to improve endurance)     Equipment Recommendations  None recommended by PT(pt has necessary DME at [facility])    Recommendations for Other Services       Precautions / Restrictions Precautions Precautions: Fall Precaution Comments: trach at baseline Restrictions Weight Bearing Restrictions: No    Mobility  Bed Mobility Overal bed mobility: Needs Assistance Bed Mobility: Supine to Sit     Supine to sit: Supervision     General bed mobility comments: supervision for safety  Transfers Overall transfer level: Needs assistance Equipment used: Rolling walker (2 wheeled) Transfers: Sit to/from Stand Sit to Stand: Min guard         General transfer comment: cues for safety; pt standing before lines were disconnected/rearranged to mobilize  Ambulation/Gait Ambulation/Gait assistance: Min guard Gait Distance (Feet): 150 Feet Assistive device: Rolling walker (2 wheeled) Gait Pattern/deviations: Step-through pattern;Trunk flexed;Decreased stride length Gait velocity: reduced   General Gait Details: cues for maintaining safe proximity to RW and upright posture; encouraged standing rest break which pt did  take with cues to decrease WOB; SpO2 >90% on 4L trach collar    Stairs             Wheelchair Mobility    Modified Rankin (Stroke Patients Only)       Balance Overall balance assessment: Needs assistance Sitting-balance support: No upper extremity supported;Feet supported Sitting balance-Leahy Scale: Good     Standing balance support: Bilateral upper extremity supported Standing balance-Leahy Scale: Poor                              Cognition Arousal/Alertness: Awake/alert Behavior During Therapy: WFL for tasks assessed/performed Overall Cognitive Status: History of cognitive impairments - at baseline                                 General Comments: pt consistently follows commands, mostly states "yes" when asked questions despite how he may actually be feeling; poor problem solving       Exercises      General Comments General comments (skin integrity, edema, etc.): SpO2 92-95% on 4-5L trach collar      Pertinent Vitals/Pain Pain Assessment: No/denies pain    Home Living                      Prior Function            PT Goals (current goals can now be found in the care plan section) Progress towards PT goals: Progressing toward goals    Frequency    Min 2X/week      PT  Plan Current plan remains appropriate    Co-evaluation              AM-PAC PT "6 Clicks" Mobility   Outcome Measure  Help needed turning from your back to your side while in a flat bed without using bedrails?: None Help needed moving from lying on your back to sitting on the side of a flat bed without using bedrails?: None Help needed moving to and from a bed to a chair (including a wheelchair)?: None Help needed standing up from a chair using your arms (e.g., wheelchair or bedside chair)?: None Help needed to walk in hospital room?: None Help needed climbing 3-5 steps with a railing? : A Little 6 Click Score: 23    End of Session  Equipment Utilized During Treatment: Oxygen;Gait belt Activity Tolerance: Patient tolerated treatment well Patient left: in chair;with call bell/phone within reach Nurse Communication: Mobility status PT Visit Diagnosis: Other (comment);Other abnormalities of gait and mobility (R26.89)(cardiopulmonary endurance deficits)     Time: 0623-7628 PT Time Calculation (min) (ACUTE ONLY): 34 min  Charges:  $Gait Training: 23-37 mins                     Earney Navy, PTA Acute Rehabilitation Services Pager: [phone] Office: [phone]     Darliss Cheney 12/18/2019, 4:50 PM

## 2019-12-19 ENCOUNTER — Inpatient Hospital Stay (HOSPITAL_COMMUNITY): Payer: Medicaid Other

## 2019-12-19 LAB — CULTURE, BLOOD (ROUTINE X 2)
Culture: NO GROWTH
Culture: NO GROWTH
Special Requests: ADEQUATE

## 2019-12-19 LAB — GLUCOSE, CAPILLARY
Glucose-Capillary: 97 mg/dL (ref 70–99)
Glucose-Capillary: 127 mg/dL — ABNORMAL HIGH (ref 70–99)
Glucose-Capillary: 198 mg/dL — ABNORMAL HIGH (ref 70–99)

## 2019-12-19 LAB — CBC
HCT: 48.3 % (ref 39.0–52.0)
Hemoglobin: 14.7 g/dL (ref 13.0–17.0)
MCH: 26.5 pg (ref 26.0–34.0)
MCHC: 30.4 g/dL (ref 30.0–36.0)
MCV: 87.2 fL (ref 80.0–100.0)
Platelets: 224 10*3/uL (ref 150–400)
RBC: 5.54 MIL/uL (ref 4.22–5.81)
RDW: 15 % (ref 11.5–15.5)
WBC: 7.4 10*3/uL (ref 4.0–10.5)
nRBC: 0 % (ref 0.0–0.2)

## 2019-12-19 LAB — RENAL FUNCTION PANEL
Albumin: 2.8 g/dL — ABNORMAL LOW (ref 3.5–5.0)
Anion gap: 9 (ref 5–15)
BUN: 18 mg/dL (ref 6–20)
CO2: 31 mmol/L (ref 22–32)
Calcium: 8.6 mg/dL — ABNORMAL LOW (ref 8.9–10.3)
Chloride: 98 mmol/L (ref 98–111)
Creatinine, Ser: 1.11 mg/dL (ref 0.61–1.24)
GFR calc Af Amer: >60 mL/min (ref >60)
GFR calc non Af Amer: >60 mL/min (ref >60)
Glucose, Bld: 110 mg/dL — ABNORMAL HIGH (ref 70–99)
Phosphorus: 3 mg/dL (ref 2.5–4.6)
Potassium: 4.5 mmol/L (ref 3.5–5.1)
Sodium: 138 mmol/L (ref 135–145)

## 2019-12-19 LAB — MAGNESIUM: Magnesium: 2.2 mg/dL (ref 1.7–2.4)

## 2019-12-19 NOTE — Progress Notes (Signed)
RT placed an omni flex and a trach HME w/O2 port on pts trach and bled in 2 L oxygen for the night. This will allow for humidification while giving pt the 2L of oxygen that he normally wears at his SNF at night vs the trach collar he was wearing here at this facility. At SNF pt only wears oxygen at night while sleeping, and nothing during the day per pt.RT will do a trial of the pt on RA in the morning to see how well pt can tolerate being without oxygen per MD. Pts respiratory status is stable at this time. RT will continue to monitor.

## 2019-12-19 NOTE — Progress Notes (Signed)
PROGRESS NOTE  Tommy Mathews SWH:[id number] DOB: [birth year]   PCP: Georgann Housekeeper, MD  Patient is from: [facility]  DOA: 12/14/2019 LOS: 5  Brief Narrative / Interim history: 59 year old male with history of COPD s/p trach in 2019 not on oxygen at baseline, systolic CHF, TBI, DM-2, paroxysmal A. fib, GI bleed, depression, BPD-1, and schizophrenia presented with shortness of breath and hypoxemia to 60%.  He was admitted with acute on chronic respiratory failure with hypoxia due to acute on chronic systolic CHF and started on IV Lasix with good response. He was eventually transitioned to oral lasix and continued to do well.  COVID-19 was negative.  Subjective: Seen and examined earlier this morning.  No major events overnight or this morning.  No complaints.  Denies chest pain, dyspnea, GI or UTI symptoms.  He is on 5 L / 28%.   Objective: Vitals:   12/19/19 0851 12/19/19 0852 12/19/19 1109 12/19/19 1131  BP:    130/77  Pulse:   (!) 56 63  Resp:  15 15 18   Temp:    98 F (36.7 C)  TempSrc:    Oral  SpO2: 95%  (!) 84% 98%  Weight:      Height:        Intake/Output Summary (Last 24 hours) at 12/19/2019 1137 Last data filed at 12/19/2019 0900 Gross per 24 hour  Intake 730 ml  Output 1075 ml  Net -345 ml   Filed Weights   12/17/19 0400 12/18/19 0340 12/19/19 0312  Weight: 101 kg 104 kg 100.4 kg    Examination:  GENERAL: No acute distress.  Appears well.  HEENT: MMM.  Vision and hearing grossly intact.  NECK: Trach collar in place. RESP: 93% on 5 L / 28% FiO2.  No IWOB.  Rhonchi bilaterally. CVS:  RRR. Heart sounds normal.  ABD/GI/GU: Bowel sounds present. Soft. Non tender.  MSK/EXT:  Moves extremities. No apparent deformity. No edema.  SKIN: no apparent skin lesion or wound NEURO: Awake and alert.  Aphonia..  No apparent focal neuro deficit. PSYCH: Calm. Normal affect.  Procedures:  None  Assessment & Plan: Acute on chronic respiratory failure with hypoxia in  patient with tracheostomy:  Due to systolic CHF exacerbation.  Improving but continues to require supplemental oxygen.  He is currently on 5 L / 28% FiO2.  Patient is not on oxygen prior to arrival. -Wean oxygen to room air as able -Treat CHF as below.  -Breathing treatments and tracheostomy care per respiratory therapy  Acute on chronic systolic CHF with recovered LV function: Echo with EF of 55 to 60% (20 to 25% in 2019), G-2DD and no other significant finding.  Dialyzed with IV Lasix and transition to oral Lasix.  Had about 1.1 L UOP in the last 24 hours.  Renal function is stable. -Continue oral Lasix 40 mg twice daily.  He is only on 20 mg daily POA. -Check portable chest x-ray.  Will check BNP in the morning. -Monitor fluid status, renal function and electrolytes -Sodium and fluid restriction -OOB/PT/OT  History of TBI with tracheostomy-stable.  -PT/OT  Consult DM-2 with hyperglycemia: A1c 6.9%. Recent Labs    12/18/19 1603 12/18/19 2109 12/19/19 0613  GLUCAP 137* 123* 97  -Continue current regimen with a statin.  Paroxysmal A. fib: On metoprolol and digoxin at home.  CHA2DS2-VASc score > 4 but history of GI bleed. -Continue metoprolol and digoxin  History of GI bleed/GERD -Continue PPI  Depression/BPD-1/schizophrenia: Stable. -Continue home meds-Seroquel, Prozac, Depakote,  Klonopin               DVT prophylaxis: Subcu Lovenox Code Status: Full code Family Communication: Updated patient's mother over the phone on 3/19.  Discharge barrier: Acute on chronic respiratory failure requiring supplemental oxygen due to CHF exacerbation. Patient is from: SNF Final disposition: SNF when able to liberate from oxygen.  May have to discharge on oxygen if not able to liberate completely.  Consultants: None   Microbiology summarized: 3/15-COVID-19 PCR negative 3/15-urine culture multiple species 3/15-blood cultures NGTD  Sch Meds:  Scheduled Meds: . budesonide  0.5  mg Nebulization BID  . chlorhexidine  15 mL Mouth Rinse BID  . clonazePAM  0.25 mg Oral BID  . digoxin  0.25 mg Oral Daily  . divalproex  500 mg Oral BID  . enoxaparin (LOVENOX) injection  40 mg Subcutaneous Q24H  . fluconazole  100 mg Oral Daily  . FLUoxetine  20 mg Oral Daily  . furosemide  40 mg Oral BID  . gabapentin  200 mg Oral Q12H  . guaiFENesin  600 mg Oral BID  . insulin aspart  0-9 Units Subcutaneous TID WC  . insulin glargine  5 Units Subcutaneous QHS  . lisinopril  10 mg Oral Daily  . magnesium oxide  400 mg Oral BID  . mouth rinse  15 mL Mouth Rinse q12n4p  . metoprolol tartrate  25 mg Oral BID  . pantoprazole  40 mg Oral Daily  . QUEtiapine  200 mg Oral QHS  . sodium chloride flush  3 mL Intravenous Q12H   Continuous Infusions: . sodium chloride     PRN Meds:.sodium chloride, acetaminophen, acetylcysteine, ipratropium-albuterol, ondansetron (ZOFRAN) IV, sodium chloride flush  Antimicrobials: Anti-infectives (From admission, onward)   Start     Dose/Rate Route Frequency Ordered Stop   12/14/19 1000  fluconazole (DIFLUCAN) tablet 100 mg     100 mg Oral Daily 12/14/19 0902 12/28/19 0959       I have personally reviewed the following labs and images: CBC: Recent Labs  Lab 12/14/19 0240 12/14/19 0327 12/15/19 0122 12/16/19 0245 12/17/19 0243 12/18/19 1300 12/19/19 0224  WBC 10.3   < > 9.9 7.3 7.7 7.7 7.4  NEUTROABS 5.8  --  5.4 3.6  --   --   --   HGB 14.9   < > 14.9 15.3 15.9 16.0 14.7  HCT 49.8   < > 48.6 49.8 50.9 52.6* 48.3  MCV 90.7   < > 88.8 88.3 87.2 88.4 87.2  PLT 221   < > 229 240 250 269 224   < > = values in this interval not displayed.   BMP &GFR Recent Labs  Lab 12/15/19 0122 12/16/19 0245 12/17/19 0243 12/18/19 1300 12/19/19 0224  NA 139 137 137 137 138  K 4.0 3.8 4.2 4.3 4.5  CL 99 97* 96* 96* 98  CO2 26 31 30 30 31   GLUCOSE 156* 120* 130* 133* 110*  BUN 17 15 19 19 18   CREATININE 1.13 1.04 0.99 1.15 1.11  CALCIUM 8.5*  8.6* 8.8* 8.7* 8.6*  MG  --   --  2.1 2.1 2.2  PHOS  --   --  3.3  --  3.0   Estimated Creatinine Clearance: 87.8 mL/min (by C-G formula based on SCr of 1.11 mg/dL). Liver & Pancreas: Recent Labs  Lab 12/14/19 0240 12/17/19 0243 12/19/19 0224  AST 14*  --   --   ALT 9  --   --  ALKPHOS 54  --   --   BILITOT 0.5  --   --   PROT 6.7  --   --   ALBUMIN 3.0* 2.8* 2.8*   No results for input(s): LIPASE, AMYLASE in the last 168 hours. No results for input(s): AMMONIA in the last 168 hours. Diabetic: No results for input(s): HGBA1C in the last 72 hours. Recent Labs  Lab 12/18/19 0623 12/18/19 1040 12/18/19 1603 12/18/19 2109 12/19/19 0613  GLUCAP 111* 121* 137* 123* 97   Cardiac Enzymes: No results for input(s): CKTOTAL, CKMB, CKMBINDEX, TROPONINI in the last 168 hours. No results for input(s): PROBNP in the last 8760 hours. Coagulation Profile: Recent Labs  Lab 12/14/19 0240  INR 1.0   Thyroid Function Tests: No results for input(s): TSH, T4TOTAL, FREET4, T3FREE, THYROIDAB in the last 72 hours. Lipid Profile: No results for input(s): CHOL, HDL, LDLCALC, TRIG, CHOLHDL, LDLDIRECT in the last 72 hours. Anemia Panel: No results for input(s): VITAMINB12, FOLATE, FERRITIN, TIBC, IRON, RETICCTPCT in the last 72 hours. Urine analysis:    Component Value Date/Time   COLORURINE YELLOW 12/14/2019 0430   APPEARANCEUR CLEAR 12/14/2019 0430   LABSPEC 1.029 12/14/2019 0430   PHURINE 5.0 12/14/2019 0430   GLUCOSEU NEGATIVE 12/14/2019 0430   HGBUR NEGATIVE 12/14/2019 0430   BILIRUBINUR SMALL (A) 12/14/2019 0430   KETONESUR 5 (A) 12/14/2019 0430   PROTEINUR 30 (A) 12/14/2019 0430   NITRITE NEGATIVE 12/14/2019 0430   LEUKOCYTESUR TRACE (A) 12/14/2019 0430   Sepsis Labs: Invalid input(s): PROCALCITONIN, LACTICIDVEN  Microbiology: Recent Results (from the past 240 hour(s))  Blood Culture (routine x 2)     Status: None   Collection Time: 12/14/19  2:57 AM   Specimen: BLOOD  LEFT HAND  Result Value Ref Range Status   Specimen Description BLOOD LEFT HAND  Final   Special Requests   Final    BOTTLES DRAWN AEROBIC AND ANAEROBIC Blood Culture results may not be optimal due to an excessive volume of blood received in culture bottles   Culture   Final    NO GROWTH 5 DAYS Performed at [facility], [address] [place], [place] [address]    Report Status 12/19/2019 FINAL  Final  Urine culture     Status: Abnormal   Collection Time: 12/14/19  2:57 AM   Specimen: In/Out Cath Urine  Result Value Ref Range Status   Specimen Description IN/OUT CATH URINE  Final   Special Requests   Final    NONE Performed at [facility], [address] [place], [place] [address]    Culture MULTIPLE SPECIES PRESENT, SUGGEST RECOLLECTION (A)  Final   Report Status 12/15/2019 FINAL  Final  Blood Culture (routine x 2)     Status: None   Collection Time: 12/14/19  3:02 AM   Specimen: BLOOD  Result Value Ref Range Status   Specimen Description BLOOD RIGHT ANTECUBITAL  Final   Special Requests   Final    BOTTLES DRAWN AEROBIC AND ANAEROBIC Blood Culture adequate volume   Culture   Final    NO GROWTH 5 DAYS Performed at [facility], [address] [place], [place] [address]    Report Status 12/19/2019 FINAL  Final  SARS CORONAVIRUS 2 (TAT 6-24 HRS) Nasopharyngeal Nasopharyngeal Swab     Status: None   Collection Time: 12/14/19  5:44 AM   Specimen: Nasopharyngeal Swab  Result Value Ref Range Status   SARS Coronavirus 2 NEGATIVE NEGATIVE Final  Comment: (NOTE) SARS-CoV-2 target nucleic acids are NOT DETECTED. The SARS-CoV-2 RNA is generally detectable in upper and lower respiratory specimens during the acute phase of infection. Negative results do not preclude SARS-CoV-2 infection, do not rule out co-infections with other pathogens, and should not be used as the sole basis for treatment or other patient management decisions. Negative results must be  combined with clinical observations, patient history, and epidemiological information. The expected result is Negative. Fact Sheet for Patients: SugarRoll.be Fact Sheet for Healthcare Providers: https://www.woods-mathews.com/ This test is not yet approved or cleared by the [place] FDA and  has been authorized for detection and/or diagnosis of SARS-CoV-2 by FDA under an Emergency Use Authorization (EUA). This EUA will remain  in effect (meaning this test can be used) for the duration of the COVID-19 declaration under Section 56 4(b)(1) of the Act, 21 U.S.C. section 360bbb-3(b)(1), unless the authorization is terminated or revoked sooner. Performed at [facility], [place] [address], [place], [place] [address]   MRSA PCR Screening     Status: None   Collection Time: 12/15/19 12:07 AM   Specimen: Nasopharyngeal  Result Value Ref Range Status   MRSA by PCR NEGATIVE NEGATIVE Final    Comment:        The GeneXpert MRSA Assay (FDA approved for NASAL specimens only), is one component of a comprehensive MRSA colonization surveillance program. It is not intended to diagnose MRSA infection nor to guide or monitor treatment for MRSA infections. Performed at [facility], [place] [address], [place], [place] [address]   SARS CORONAVIRUS 2 (TAT 6-24 HRS) Nasopharyngeal Nasopharyngeal Swab     Status: None   Collection Time: 12/17/19  7:00 PM   Specimen: Nasopharyngeal Swab  Result Value Ref Range Status   SARS Coronavirus 2 NEGATIVE NEGATIVE Final    Comment: (NOTE) SARS-CoV-2 target nucleic acids are NOT DETECTED. The SARS-CoV-2 RNA is generally detectable in upper and lower respiratory specimens during the acute phase of infection. Negative results do not preclude SARS-CoV-2 infection, do not rule out co-infections with other pathogens, and should not be used as the sole basis for treatment or other patient management decisions.  Negative results must be combined with clinical observations, patient history, and epidemiological information. The expected result is Negative. Fact Sheet for Patients: SugarRoll.be Fact Sheet for Healthcare Providers: https://www.woods-mathews.com/ This test is not yet approved or cleared by the [place] FDA and  has been authorized for detection and/or diagnosis of SARS-CoV-2 by FDA under an Emergency Use Authorization (EUA). This EUA will remain  in effect (meaning this test can be used) for the duration of the COVID-19 declaration under Section 56 4(b)(1) of the Act, 21 U.S.C. section 360bbb-3(b)(1), unless the authorization is terminated or revoked sooner. Performed at [facility], [place] [address], [place], [place] [address]     Radiology Studies: No results found.    Taye T. [place]  If 7PM-7AM, please contact night-coverage [email or web address] Password [facility] 12/19/2019, 11:37 AM

## 2019-12-19 NOTE — Plan of Care (Signed)

## 2019-12-20 DIAGNOSIS — F419 Anxiety disorder, unspecified: Secondary | ICD-10-CM

## 2019-12-20 DIAGNOSIS — B372 Candidiasis of skin and nail: Secondary | ICD-10-CM

## 2019-12-20 DIAGNOSIS — Z930 Tracheostomy status: Secondary | ICD-10-CM

## 2019-12-20 DIAGNOSIS — I10 Essential (primary) hypertension: Secondary | ICD-10-CM

## 2019-12-20 DIAGNOSIS — F329 Major depressive disorder, single episode, unspecified: Secondary | ICD-10-CM

## 2019-12-20 LAB — RENAL FUNCTION PANEL
Albumin: 2.7 g/dL — ABNORMAL LOW (ref 3.5–5.0)
Anion gap: 9 (ref 5–15)
BUN: 18 mg/dL (ref 6–20)
CO2: 31 mmol/L (ref 22–32)
Calcium: 8.7 mg/dL — ABNORMAL LOW (ref 8.9–10.3)
Chloride: 98 mmol/L (ref 98–111)
Creatinine, Ser: 1.15 mg/dL (ref 0.61–1.24)
GFR calc Af Amer: >60 mL/min (ref >60)
GFR calc non Af Amer: >60 mL/min (ref >60)
Glucose, Bld: 168 mg/dL — ABNORMAL HIGH (ref 70–99)
Phosphorus: 2.3 mg/dL — ABNORMAL LOW (ref 2.5–4.6)
Potassium: 4.2 mmol/L (ref 3.5–5.1)
Sodium: 138 mmol/L (ref 135–145)

## 2019-12-20 LAB — CBC
HCT: 49.1 % (ref 39.0–52.0)
Hemoglobin: 14.8 g/dL (ref 13.0–17.0)
MCH: 26.7 pg (ref 26.0–34.0)
MCHC: 30.1 g/dL (ref 30.0–36.0)
MCV: 88.6 fL (ref 80.0–100.0)
Platelets: 238 10*3/uL (ref 150–400)
RBC: 5.54 MIL/uL (ref 4.22–5.81)
RDW: 15.1 % (ref 11.5–15.5)
WBC: 6.9 10*3/uL (ref 4.0–10.5)
nRBC: 0 % (ref 0.0–0.2)

## 2019-12-20 LAB — DIGOXIN LEVEL: Digoxin Level: 0.8 ng/mL (ref 0.8–2.0)

## 2019-12-20 LAB — MAGNESIUM: Magnesium: 1.9 mg/dL (ref 1.7–2.4)

## 2019-12-20 LAB — GLUCOSE, CAPILLARY
Glucose-Capillary: 84 mg/dL (ref 70–99)
Glucose-Capillary: 157 mg/dL — ABNORMAL HIGH (ref 70–99)

## 2019-12-20 LAB — VALPROIC ACID LEVEL: Valproic Acid Lvl: 50 ug/mL (ref 50.0–100.0)

## 2019-12-20 LAB — SARS CORONAVIRUS 2 (TAT 6-24 HRS): SARS Coronavirus 2: NEGATIVE

## 2019-12-20 LAB — BRAIN NATRIURETIC PEPTIDE: B Natriuretic Peptide: 14 pg/mL (ref 0.0–100.0)

## 2019-12-20 MED ORDER — NYSTATIN 100000 UNIT/GM EX CREA: TOPICAL_CREAM | CUTANEOUS | 0 refills | Status: AC

## 2019-12-20 MED ORDER — CLONAZEPAM 0.5 MG PO TABS: 0.2500 mg | ORAL_TABLET | Freq: Two times a day (BID) | ORAL | 0 refills | Status: DC

## 2019-12-20 MED ORDER — FLUCONAZOLE 100 MG PO TABS: 100.0000 mg | ORAL_TABLET | Freq: Every day | ORAL | 0 refills | Status: DC

## 2019-12-20 MED ORDER — METOPROLOL SUCCINATE ER 25 MG PO TB24: 25.0000 mg | ORAL_TABLET | Freq: Every day | ORAL | 11 refills | Status: DC

## 2019-12-20 MED ORDER — NYSTATIN 100000 UNIT/GM EX CREA
TOPICAL_CREAM | Freq: Two times a day (BID) | CUTANEOUS | Status: DC
  Filled 2019-12-20: qty 15

## 2019-12-20 MED ORDER — FUROSEMIDE 40 MG PO TABS: 40.0000 mg | ORAL_TABLET | Freq: Two times a day (BID) | ORAL | Status: DC

## 2019-12-20 NOTE — Plan of Care (Signed)
  Problem: Education: Goal: Knowledge of General Education information will improve Description: Including pain rating scale, medication(s)/side effects and non-pharmacologic comfort measures 12/20/2019 1340 by Alesia Morin, RN Outcome: Adequate for Discharge 12/20/2019 0827 by Alesia Morin, RN Outcome: Progressing   Problem: Health Behavior/Discharge Planning: Goal: Ability to manage health-related needs will improve 12/20/2019 1340 by Alesia Morin, RN Outcome: Adequate for Discharge 12/20/2019 0827 by Alesia Morin, RN Outcome: Progressing   Problem: Clinical Measurements: Goal: Ability to maintain clinical measurements within normal limits will improve 12/20/2019 1340 by Alesia Morin, RN Outcome: Adequate for Discharge 12/20/2019 0827 by Alesia Morin, RN Outcome: Progressing Goal: Will remain free from infection 12/20/2019 1340 by Alesia Morin, RN Outcome: Adequate for Discharge 12/20/2019 0827 by Alesia Morin, RN Outcome: Progressing Goal: Diagnostic test results will improve 12/20/2019 1340 by Alesia Morin, RN Outcome: Adequate for Discharge 12/20/2019 0827 by Alesia Morin, RN Outcome: Progressing Goal: Respiratory complications will improve 12/20/2019 1340 by Alesia Morin, RN Outcome: Adequate for Discharge 12/20/2019 0827 by Alesia Morin, RN Outcome: Progressing Goal: Cardiovascular complication will be avoided 12/20/2019 1340 by Alesia Morin, RN Outcome: Adequate for Discharge 12/20/2019 0827 by Alesia Morin, RN Outcome: Progressing   Problem: Activity: Goal: Risk for activity intolerance will decrease 12/20/2019 1340 by Alesia Morin, RN Outcome: Adequate for Discharge 12/20/2019 0827 by Alesia Morin, RN Outcome: Progressing   Problem: Nutrition: Goal: Adequate nutrition will be maintained 12/20/2019 1340 by Alesia Morin, RN Outcome: Adequate for Discharge 12/20/2019 0827 by  Alesia Morin, RN Outcome: Progressing   Problem: Coping: Goal: Level of anxiety will decrease 12/20/2019 1340 by Alesia Morin, RN Outcome: Adequate for Discharge 12/20/2019 0827 by Alesia Morin, RN Outcome: Progressing   Problem: Elimination: Goal: Will not experience complications related to bowel motility 12/20/2019 1340 by Alesia Morin, RN Outcome: Adequate for Discharge 12/20/2019 0827 by Alesia Morin, RN Outcome: Progressing Goal: Will not experience complications related to urinary retention 12/20/2019 1340 by Alesia Morin, RN Outcome: Adequate for Discharge 12/20/2019 0827 by Alesia Morin, RN Outcome: Progressing   Problem: Pain Managment: Goal: General experience of comfort will improve 12/20/2019 1340 by Alesia Morin, RN Outcome: Adequate for Discharge 12/20/2019 0827 by Alesia Morin, RN Outcome: Progressing   Problem: Safety: Goal: Ability to remain free from injury will improve 12/20/2019 1340 by Alesia Morin, RN Outcome: Adequate for Discharge 12/20/2019 0827 by Alesia Morin, RN Outcome: Progressing   Problem: Skin Integrity: Goal: Risk for impaired skin integrity will decrease 12/20/2019 1340 by Alesia Morin, RN Outcome: Adequate for Discharge 12/20/2019 0827 by Alesia Morin, RN Outcome: Progressing

## 2019-12-20 NOTE — TOC Transition Note (Signed)
Transition of Care [facility]) - CM/SW Discharge Note   Patient Details  Name: Tommy Mathews MRN: [id number] Date of Birth: 1961/07/20  [facility] CM/SW Contact:  Lorri Frederick, LCSW Phone Number: 12/20/2019, 1:05 PM   Clinical Narrative:   CSW spoke with Shanda Bumps RN at [facility] and confirmed that pt is accepted to room 101A.  Update covid test not required.  [facility] to transport.      Barriers to Discharge: Continued Medical Work up   Patient Goals and CMS Choice Patient states their goals for this hospitalization and ongoing recovery are:: to go to short term rehab CMS Medicare.gov Compare Post Acute Care list provided to:: Patient Choice offered to / list presented to : Patient  Discharge Placement                       Discharge Plan and Services                                     Social Determinants of Health (SDOH) Interventions     Readmission Risk Interventions No flowsheet data found.

## 2019-12-20 NOTE — Discharge Summary (Signed)
Physician Discharge Summary  Tommy Mathews BJS:[id number] DOB: 16-Aug-1961 DOA: 12/14/2019  PCP: Georgann Housekeeper, MD  Admit date: 12/14/2019 Discharge date: 12/20/2019  Admitted From: [facility] Disposition: [facility]  Recommendations for Outpatient Follow-up:  1. Follow ups as below. 2. Please obtain CBC/BMP/Mag at follow up 3. Please follow up on the following pending results: None   Discharge Condition: Stable CODE STATUS: Full code  Follow-up Information    Georgann Housekeeper, MD. Schedule an appointment as soon as possible for a visit in 1 week(s).   Specialty: Internal Medicine Contact information: [address] [place] [address] [phone]           Hospital Course: 59 year old male with history of COPD s/p trach in 2019 not on oxygen at baseline, systolic CHF, TBI, DM-2, paroxysmal A. fib, GI bleed, depression, BPD-1, and schizophrenia presented with shortness of breath and hypoxemia to 60%.  He was admitted with acute on chronic respiratory failure with hypoxia due to acute on chronic systolic CHF and started on IV Lasix with good response. He was eventually transitioned to oral lasix and continued to do well.  Renal function is stable.  Repeat CXR with significant atelectasis. COVID-19 was negative.  Recheck renal function, magnesium and CBC in about a week.  Encourage incentive spirometry.  See individual problem list below for more on hospital course.  Discharge Diagnoses:  Acute on chronic respiratory failure with hypoxia in patient with tracheostomy:  due to systolic CHF exacerbation and and atelectasis. Diuresed with IV Lasix and transition to oral Lasix.  Net -4 L since admission.  Respiratory status improved.  Good saturation on 2 L oxygen-that he uses at SNF. -Continue oral Lasix 40 mg twice daily -Encourage incentive spirometry -Continue DuoNeb, budesonide and Mucomyst  Acute on chronic systolic CHF with recovered LV  function: Echo with EF of 55 to 60% (20 to 25% in 2019), G-2DD and no other significant finding.  Repeat CXR with significant atelectasis. -Continue oral Lasix 40 mg twice daily.  -Continue metoprolol XL 25 mg daily. -Fluid restriction to 1500 cc a day.  Sodium restriction to less than 2 g a day -OOB/PT/OT  History of TBI with tracheostomy-stable.  -Continue PT/OT  Consult DM-2 with hyperglycemia: A1c 6.9%. Recent Labs    12/19/19 1628 12/20/19 0632 12/20/19 1133  GLUCAP 198* 84 157*  -Continue Lantus and a statin.  Paroxysmal A. fib: On metoprolol and digoxin at home.  CHA2DS2-VASc score > 4 but history of GI bleed. -Continue metoprolol and digoxin  History of GI bleed/GERD -Continue PPI  Cutaneous candidiasis- -continue oral Diflucan and terbinafine cream for 2 weeks.  Depression/BPD-1/schizophrenia: Stable. -Continue home meds-Seroquel, Prozac, Depakote, Klonopin  Discharge Instructions  Discharge Instructions    Diet - low sodium heart healthy   Complete by: As directed    Diet Carb Modified   Complete by: As directed    Incentive spirometry RT   Complete by: As directed    Encourage incentive spirometry   Increase activity slowly   Complete by: As directed      Allergies as of 12/20/2019   No Known Allergies     Medication List    STOP taking these medications   metoprolol tartrate 25 MG tablet Commonly known as: LOPRESSOR     TAKE these medications   acetylcysteine 10% Soln Commonly known as: MUCOMYST Take 4 mLs by nebulization every 12 (twelve) hours as needed (secretions).   bisacodyl 10 MG suppository Commonly known  as: DULCOLAX Place 1 suppository (10 mg total) rectally daily as needed for moderate constipation.   budesonide 0.5 MG/2ML nebulizer solution Commonly known as: PULMICORT Take 2 mLs (0.5 mg total) by nebulization 2 (two) times daily.   clonazePAM 0.5 MG tablet Commonly known as: KLONOPIN Take 0.5 tablets (0.25 mg total)  by mouth 2 (two) times daily.   digoxin 0.25 MG tablet Commonly known as: LANOXIN Take 1 tablet (0.25 mg total) by mouth daily.   divalproex 500 MG DR tablet Commonly known as: DEPAKOTE Take 500 mg by mouth 2 (two) times daily.   docusate sodium 100 MG capsule Commonly known as: COLACE Take 100 mg by mouth 2 (two) times daily.   fluconazole 100 MG tablet Commonly known as: DIFLUCAN Take 1 tablet (100 mg total) by mouth daily. Start taking on: December 21, 2019   FLUoxetine 20 MG capsule Commonly known as: PROZAC Take 1 capsule (20 mg total) by mouth daily.   furosemide 40 MG tablet Commonly known as: LASIX Take 1 tablet (40 mg total) by mouth 2 (two) times daily. What changed:   medication strength  how much to take  when to take this   gabapentin 100 MG capsule Commonly known as: NEURONTIN Take 200 mg by mouth every 12 (twelve) hours.   guaiFENesin 600 MG 12 hr tablet Commonly known as: MUCINEX Take 600 mg by mouth 2 (two) times daily.   insulin glargine 100 UNIT/ML injection Commonly known as: LANTUS Inject 0.2 mLs (20 Units total) into the skin at bedtime.   ipratropium-albuterol 0.5-2.5 (3) MG/3ML Soln Commonly known as: DUONEB Take 3 mLs by nebulization every 4 (four) hours as needed.   ipratropium-albuterol 0.5-2.5 (3) MG/3ML Soln Commonly known as: DUONEB Take 3 mLs by nebulization every 4 (four) hours.   lisinopril 10 MG tablet Commonly known as: ZESTRIL Take 1 tablet (10 mg total) by mouth daily.   magnesium oxide 400 (241.3 Mg) MG tablet Commonly known as: MAG-OX Place 1 tablet (400 mg total) into feeding tube 2 (two) times daily. What changed: how to take this   metoprolol succinate 25 MG 24 hr tablet Commonly known as: Toprol XL Take 1 tablet (25 mg total) by mouth daily.   nystatin cream Commonly known as: MYCOSTATIN Apply to skin rash over his back and abdomen twice a day   pantoprazole sodium 40 mg/20 mL Pack Commonly known as:  PROTONIX Place 20 mLs (40 mg total) into feeding tube daily. What changed:   how to take this  additional instructions   QUEtiapine 200 MG tablet Commonly known as: SEROQUEL Take 1 tablet (200 mg total) by mouth at bedtime.   sodium chloride HYPERTONIC 3 % nebulizer solution Take 4 mLs by nebulization daily.       Consultations:  None  Procedures/Studies:  2D Echo on 12/14/2019 1. Compared with the echo 03/2228, systolic function has improved. Left  ventricular ejection fraction, by estimation, is 55 to 60%. The left  ventricle has normal function. The left ventricle has no regional wall  motion abnormalities. The left ventricular  internal cavity size was mildly dilated. There is mild concentric left  ventricular hypertrophy. Left ventricular diastolic parameters are  consistent with Grade II diastolic dysfunction (pseudonormalization).  2. Right ventricular systolic function is normal. The right ventricular  size is normal. There is normal pulmonary artery systolic pressure.  3. The mitral valve is normal in structure. No evidence of mitral valve  regurgitation. No evidence of mitral stenosis.  4. The  aortic valve is tricuspid. Aortic valve regurgitation is not  visualized. No aortic stenosis is present.  5. The inferior vena cava is normal in size with greater than 50%  respiratory variability, suggesting right atrial pressure of 3 mmHg.    DG Chest Port 1 View  Result Date: 12/19/2019 CLINICAL DATA:  Encounter for hypoxia. EXAM: PORTABLE CHEST 1 VIEW COMPARISON:  Chest radiograph 12/15/2019 FINDINGS: Stable cardiomediastinal contours. Tracheostomy in place. Low lung volumes. Redemonstrated diffuse bilateral interstitial pulmonary opacities with increased consolidation in the right lung base small bilateral effusions. No pneumothorax. No acute finding in the visualized skeleton. IMPRESSION: Redemonstrated diffuse bilateral pulmonary opacities with increased  consolidation in the right lung base, possibly atelectasis. Small bilateral effusions. Electronically Signed   By: Emmaline Kluver M.D.   On: 12/19/2019 15:03   DG Chest Port 1 View  Result Date: 12/15/2019 CLINICAL DATA:  Pneumonia EXAM: PORTABLE CHEST 1 VIEW COMPARISON:  12/14/2019 FINDINGS: No significant change in AP portable examination with diffuse interstitial pulmonary opacity and layering pleural effusions. Tracheostomy. IMPRESSION: No significant change in AP portable examination with diffuse interstitial pulmonary opacity and layering pleural effusions, consistent with infection and/or edema. No new or focal airspace opacity. Electronically Signed   By: Lauralyn Primes M.D.   On: 12/15/2019 09:13   DG Chest Port 1 View  Result Date: 12/14/2019 CLINICAL DATA:  Hypoxia EXAM: PORTABLE CHEST 1 VIEW COMPARISON:  08/24/2019 FINDINGS: Unchanged position of tracheostomy tube with tip at the level of the clavicular heads. Bilateral basilar predominant airspace opacities. Small pleural effusions. IMPRESSION: Bibasilar predominant opacities, likely pulmonary edema. Small pleural effusions. Electronically Signed   By: Deatra Robinson M.D.   On: 12/14/2019 03:19   ECHOCARDIOGRAM COMPLETE  Result Date: 12/14/2019    ECHOCARDIOGRAM REPORT   Patient Name:   RAUL TORRANCE Date of Exam: 12/14/2019 Medical Rec #:  [id number]     Height:       75.0 in Accession #:    [id number]    Weight:       232.6 lb Date of Birth:  Nov 09, 1960     BSA:          2.340 m Patient Age:    58 years      BP:           128/82 mmHg Patient Gender: M             HR:           57 bpm. Exam Location:  Inpatient Procedure: 2D Echo Indications:    CHF-Acute Systolic  History:        Patient has prior history of Echocardiogram examinations, most                 recent 01/10/2018. Risk Factors:Hypertension, Dyslipidemia and                 Diabetes.  Sonographer:    Thurman Coyer RDCS (AE) Referring Phys: [id number] TIMOTHY S OPYD IMPRESSIONS  1.  Compared with the echo 12/2017, systolic function has improved. Left ventricular ejection fraction, by estimation, is 55 to 60%. The left ventricle has normal function. The left ventricle has no regional wall motion abnormalities. The left ventricular  internal cavity size was mildly dilated. There is mild concentric left ventricular hypertrophy. Left ventricular diastolic parameters are consistent with Grade II diastolic dysfunction (pseudonormalization).  2. Right ventricular systolic function is normal. The right ventricular size is normal. There is normal pulmonary artery systolic pressure.  3.  The mitral valve is normal in structure. No evidence of mitral valve regurgitation. No evidence of mitral stenosis.  4. The aortic valve is tricuspid. Aortic valve regurgitation is not visualized. No aortic stenosis is present.  5. The inferior vena cava is normal in size with greater than 50% respiratory variability, suggesting right atrial pressure of 3 mmHg. FINDINGS  Left Ventricle: Compared with the echo 12/2017, systolic function has improved. Left ventricular ejection fraction, by estimation, is 55 to 60%. The left ventricle has normal function. The left ventricle has no regional wall motion abnormalities. The left ventricular internal cavity size was mildly dilated. There is mild concentric left ventricular hypertrophy. Left ventricular diastolic parameters are consistent with Grade II diastolic dysfunction (pseudonormalization). Indeterminate filling pressures. Right Ventricle: The right ventricular size is normal. No increase in right ventricular wall thickness. Right ventricular systolic function is normal. There is normal pulmonary artery systolic pressure. The tricuspid regurgitant velocity is 1.62 m/s, and  with an assumed right atrial pressure of 3 mmHg, the estimated right ventricular systolic pressure is 13.5 mmHg. Left Atrium: Left atrial size was normal in size. Right Atrium: Right atrial size was normal  in size. Pericardium: There is no evidence of pericardial effusion. Mitral Valve: The mitral valve is normal in structure. Normal mobility of the mitral valve leaflets. No evidence of mitral valve regurgitation. No evidence of mitral valve stenosis. Tricuspid Valve: The tricuspid valve is normal in structure. Tricuspid valve regurgitation is trivial. No evidence of tricuspid stenosis. Aortic Valve: The aortic valve is tricuspid. Aortic valve regurgitation is not visualized. No aortic stenosis is present. Pulmonic Valve: The pulmonic valve was normal in structure. Pulmonic valve regurgitation is not visualized. No evidence of pulmonic stenosis. Aorta: The aortic root is normal in size and structure. Venous: The inferior vena cava is normal in size with greater than 50% respiratory variability, suggesting right atrial pressure of 3 mmHg. IAS/Shunts: No atrial level shunt detected by color flow Doppler.  LEFT VENTRICLE PLAX 2D LVIDd:         5.20 cm  Diastology LVIDs:         3.70 cm  LV e' lateral:   6.24 cm/s LV PW:         1.10 cm  LV E/e' lateral: 13.8 LV IVS:        1.10 cm  LV e' medial:    6.41 cm/s LVOT diam:     2.30 cm  LV E/e' medial:  13.4 LV SV:         79 LV SV Index:   34 LVOT Area:     4.15 cm  RIGHT VENTRICLE RV S prime:     10.20 cm/s TAPSE (M-mode): 1.5 cm LEFT ATRIUM             Index       RIGHT ATRIUM           Index LA diam:        3.90 cm 1.67 cm/m  RA Area:     14.60 cm LA Vol (A2C):   66.3 ml 28.34 ml/m RA Volume:   32.80 ml  14.02 ml/m LA Vol (A4C):   39.3 ml 16.80 ml/m LA Biplane Vol: 51.3 ml 21.93 ml/m  AORTIC VALVE LVOT Vmax:   90.00 cm/s LVOT Vmean:  56.000 cm/s LVOT VTI:    0.190 m  AORTA Ao Root diam: 2.70 cm MITRAL VALVE  TRICUSPID VALVE MV Area (PHT): 5.66 cm    TR Peak grad:   10.5 mmHg MV Decel Time: 134 msec    TR Vmax:        162.00 cm/s MV E velocity: 85.90 cm/s MV A velocity: 52.50 cm/s  SHUNTS MV E/A ratio:  1.64        Systemic VTI:  0.19 m                             Systemic Diam: 2.30 cm Chilton Si MD Electronically signed by Chilton Si MD Signature Date/Time: 12/14/2019/10:25:27 PM    Final       Discharge Exam: Vitals:   12/20/19 0740 12/20/19 1130  BP: 131/62 127/73  Pulse: (!) 51 62  Resp: 17 18  Temp: 97.6 F (36.4 C) 97.7 F (36.5 C)  SpO2: 93% 95%    GENERAL: No acute distress.  Appears well.  HEENT: MMM.  Vision and hearing grossly intact.  NECK: Tracheostomy RESP:  No IWOB.  Fair aeration bilaterally CVS:  RRR. Heart sounds normal.  ABD/GI/GU: Bowel sounds present. Soft. Non tender.  MSK/EXT:  Moves extremities. No apparent deformity or edema.  SKIN: Skin rash over his back NEURO: Awake, alert and oriented appropriately. Aphonia  No apparent focal neuro deficit. PSYCH: Calm. Normal affect.   The results of significant diagnostics from this hospitalization (including imaging, microbiology, ancillary and laboratory) are listed below for reference.     Microbiology: Recent Results (from the past 240 hour(s))  Blood Culture (routine x 2)     Status: None   Collection Time: 12/14/19  2:57 AM   Specimen: BLOOD LEFT HAND  Result Value Ref Range Status   Specimen Description BLOOD LEFT HAND  Final   Special Requests   Final    BOTTLES DRAWN AEROBIC AND ANAEROBIC Blood Culture results may not be optimal due to an excessive volume of blood received in culture bottles   Culture   Final    NO GROWTH 5 DAYS Performed at [facility], [address], [place], [place] [address]    Report Status 12/19/2019 FINAL  Final  Urine culture     Status: Abnormal   Collection Time: 12/14/19  2:57 AM   Specimen: In/Out Cath Urine  Result Value Ref Range Status   Specimen Description IN/OUT CATH URINE  Final   Special Requests   Final    NONE Performed at [facility], [address], [place], [place] [address]    Culture MULTIPLE SPECIES PRESENT, SUGGEST RECOLLECTION (A)  Final   Report Status 12/15/2019  FINAL  Final  Blood Culture (routine x 2)     Status: None   Collection Time: 12/14/19  3:02 AM   Specimen: BLOOD  Result Value Ref Range Status   Specimen Description BLOOD RIGHT ANTECUBITAL  Final   Special Requests   Final    BOTTLES DRAWN AEROBIC AND ANAEROBIC Blood Culture adequate volume   Culture   Final    NO GROWTH 5 DAYS Performed at [facility], [address], [place], [place] [address]    Report Status 12/19/2019 FINAL  Final  SARS CORONAVIRUS 2 (TAT 6-24 HRS) Nasopharyngeal Nasopharyngeal Swab     Status: None   Collection Time: 12/14/19  5:44 AM   Specimen: Nasopharyngeal Swab  Result Value Ref Range Status   SARS Coronavirus 2 NEGATIVE NEGATIVE Final    Comment: (NOTE)  SARS-CoV-2 target nucleic acids are NOT DETECTED. The SARS-CoV-2 RNA is generally detectable in upper and lower respiratory specimens during the acute phase of infection. Negative results do not preclude SARS-CoV-2 infection, do not rule out co-infections with other pathogens, and should not be used as the sole basis for treatment or other patient management decisions. Negative results must be combined with clinical observations, patient history, and epidemiological information. The expected result is Negative. Fact Sheet for Patients: HairSlick.nohttps://www.fda.gov/media/138098/download Fact Sheet for Healthcare Providers: quierodirigir.comhttps://www.fda.gov/media/138095/download This test is not yet approved or cleared by the [place] FDA and  has been authorized for detection and/or diagnosis of SARS-CoV-2 by FDA under an Emergency Use Authorization (EUA). This EUA will remain  in effect (meaning this test can be used) for the duration of the COVID-19 declaration under Section 56 4(b)(1) of the Act, 21 U.S.C. section 360bbb-3(b)(1), unless the authorization is terminated or revoked sooner. Performed at [facility], [address] [place], [place] [address]   MRSA PCR Screening     Status: None    Collection Time: 12/15/19 12:07 AM   Specimen: Nasopharyngeal  Result Value Ref Range Status   MRSA by PCR NEGATIVE NEGATIVE Final    Comment:        The GeneXpert MRSA Assay (FDA approved for NASAL specimens only), is one component of a comprehensive MRSA colonization surveillance program. It is not intended to diagnose MRSA infection nor to guide or monitor treatment for MRSA infections. Performed at [facility], [address] [place], [place] [address]   SARS CORONAVIRUS 2 (TAT 6-24 HRS) Nasopharyngeal Nasopharyngeal Swab     Status: None   Collection Time: 12/17/19  7:00 PM   Specimen: Nasopharyngeal Swab  Result Value Ref Range Status   SARS Coronavirus 2 NEGATIVE NEGATIVE Final    Comment: (NOTE) SARS-CoV-2 target nucleic acids are NOT DETECTED. The SARS-CoV-2 RNA is generally detectable in upper and lower respiratory specimens during the acute phase of infection. Negative results do not preclude SARS-CoV-2 infection, do not rule out co-infections with other pathogens, and should not be used as the sole basis for treatment or other patient management decisions. Negative results must be combined with clinical observations, patient history, and epidemiological information. The expected result is Negative. Fact Sheet for Patients: HairSlick.nohttps://www.fda.gov/media/138098/download Fact Sheet for Healthcare Providers: quierodirigir.comhttps://www.fda.gov/media/138095/download This test is not yet approved or cleared by the [place] FDA and  has been authorized for detection and/or diagnosis of SARS-CoV-2 by FDA under an Emergency Use Authorization (EUA). This EUA will remain  in effect (meaning this test can be used) for the duration of the COVID-19 declaration under Section 56 4(b)(1) of the Act, 21 U.S.C. section 360bbb-3(b)(1), unless the authorization is terminated or revoked sooner. Performed at [facility], [address] [place], [place] [address]      Labs: BNP  (last 3 results) Recent Labs    08/24/19 0615 12/14/19 0240 12/20/19 0222  BNP 42.7 877.2* 14.0   Basic Metabolic Panel: Recent Labs  Lab 12/16/19 0245 12/17/19 0243 12/18/19 1300 12/19/19 0224 12/20/19 0222  NA 137 137 137 138 138  K 3.8 4.2 4.3 4.5 4.2  CL 97* 96* 96* 98 98  CO2 31 30 30 31 31   GLUCOSE 120* 130* 133* 110* 168*  BUN 15 19 19 18 18   CREATININE 1.04 0.99 1.15 1.11 1.15  CALCIUM 8.6* 8.8* 8.7* 8.6* 8.7*  MG  --  2.1 2.1 2.2 1.9  PHOS  --  3.3  --  3.0 2.3*   Liver Function Tests: Recent Labs  Lab 12/14/19 0240 12/17/19 0243 12/19/19 0224 12/20/19 0222  AST 14*  --   --   --   ALT 9  --   --   --   ALKPHOS 54  --   --   --   BILITOT 0.5  --   --   --   PROT 6.7  --   --   --   ALBUMIN 3.0* 2.8* 2.8* 2.7*   No results for input(s): LIPASE, AMYLASE in the last 168 hours. No results for input(s): AMMONIA in the last 168 hours. CBC: Recent Labs  Lab 12/14/19 0240 12/14/19 0327 12/15/19 0122 12/15/19 0122 12/16/19 0245 12/17/19 0243 12/18/19 1300 12/19/19 0224 12/20/19 0222  WBC 10.3   < > 9.9   < > 7.3 7.7 7.7 7.4 6.9  NEUTROABS 5.8  --  5.4  --  3.6  --   --   --   --   HGB 14.9   < > 14.9   < > 15.3 15.9 16.0 14.7 14.8  HCT 49.8   < > 48.6   < > 49.8 50.9 52.6* 48.3 49.1  MCV 90.7   < > 88.8   < > 88.3 87.2 88.4 87.2 88.6  PLT 221   < > 229   < > 240 250 269 224 238   < > = values in this interval not displayed.   Cardiac Enzymes: No results for input(s): CKTOTAL, CKMB, CKMBINDEX, TROPONINI in the last 168 hours. BNP: Invalid input(s): POCBNP CBG: Recent Labs  Lab 12/19/19 0613 12/19/19 1136 12/19/19 1628 12/20/19 0632 12/20/19 1133  GLUCAP 97 127* 198* 84 157*   D-Dimer No results for input(s): DDIMER in the last 72 hours. Hgb A1c No results for input(s): HGBA1C in the last 72 hours. Lipid Profile No results for input(s): CHOL, HDL, LDLCALC, TRIG, CHOLHDL, LDLDIRECT in the last 72 hours. Thyroid function studies No  results for input(s): TSH, T4TOTAL, T3FREE, THYROIDAB in the last 72 hours.  Invalid input(s): FREET3 Anemia work up No results for input(s): VITAMINB12, FOLATE, FERRITIN, TIBC, IRON, RETICCTPCT in the last 72 hours. Urinalysis    Component Value Date/Time   COLORURINE YELLOW 12/14/2019 0430   APPEARANCEUR CLEAR 12/14/2019 0430   LABSPEC 1.029 12/14/2019 0430   PHURINE 5.0 12/14/2019 0430   GLUCOSEU NEGATIVE 12/14/2019 0430   HGBUR NEGATIVE 12/14/2019 0430   BILIRUBINUR SMALL (A) 12/14/2019 0430   KETONESUR 5 (A) 12/14/2019 0430   PROTEINUR 30 (A) 12/14/2019 0430   NITRITE NEGATIVE 12/14/2019 0430   LEUKOCYTESUR TRACE (A) 12/14/2019 0430   Sepsis Labs Invalid input(s): PROCALCITONIN,  WBC,  LACTICIDVEN   Time coordinating discharge: 35 minutes  SIGNED:  Almon Hercules, MD  [facility] 12/20/2019, 12:34 PM  If 7PM-7AM, please contact night-coverage [email or web address] Password [email or web address]

## 2019-12-20 NOTE — Plan of Care (Signed)

## 2019-12-20 NOTE — Progress Notes (Signed)
Pt discharging back to [facility] via [facility] with belongings. Trach with pass muir valve.

## 2021-01-31 ENCOUNTER — Emergency Department (HOSPITAL_COMMUNITY): Payer: Medicaid Other

## 2021-01-31 ENCOUNTER — Inpatient Hospital Stay (HOSPITAL_COMMUNITY)
Admission: EM | Admit: 2021-01-31 | Discharge: 2021-02-11 | DRG: 291 | Disposition: A | Discharge status: Skilled Nursing Facility | Payer: Medicaid Other | Attending: Internal Medicine | Admitting: Internal Medicine

## 2021-01-31 DIAGNOSIS — J9601 Acute respiratory failure with hypoxia: Secondary | ICD-10-CM

## 2021-01-31 DIAGNOSIS — F3130 Bipolar disorder, current episode depressed, mild or moderate severity, unspecified: Secondary | ICD-10-CM | POA: Diagnosis present

## 2021-01-31 DIAGNOSIS — Z930 Tracheostomy status: Secondary | ICD-10-CM

## 2021-01-31 DIAGNOSIS — Z87820 Personal history of traumatic brain injury: Secondary | ICD-10-CM

## 2021-01-31 DIAGNOSIS — F209 Schizophrenia, unspecified: Secondary | ICD-10-CM

## 2021-01-31 DIAGNOSIS — R0902 Hypoxemia: Secondary | ICD-10-CM

## 2021-01-31 DIAGNOSIS — Z6830 Body mass index (BMI) 30.0-30.9, adult: Secondary | ICD-10-CM

## 2021-01-31 DIAGNOSIS — F17210 Nicotine dependence, cigarettes, uncomplicated: Secondary | ICD-10-CM | POA: Diagnosis present

## 2021-01-31 DIAGNOSIS — Z79899 Other long term (current) drug therapy: Secondary | ICD-10-CM

## 2021-01-31 DIAGNOSIS — E669 Obesity, unspecified: Secondary | ICD-10-CM | POA: Diagnosis present

## 2021-01-31 DIAGNOSIS — E785 Hyperlipidemia, unspecified: Secondary | ICD-10-CM | POA: Diagnosis present

## 2021-01-31 DIAGNOSIS — R4587 Impulsiveness: Secondary | ICD-10-CM | POA: Diagnosis present

## 2021-01-31 DIAGNOSIS — Z794 Long term (current) use of insulin: Secondary | ICD-10-CM

## 2021-01-31 DIAGNOSIS — K219 Gastro-esophageal reflux disease without esophagitis: Secondary | ICD-10-CM | POA: Diagnosis present

## 2021-01-31 DIAGNOSIS — E119 Type 2 diabetes mellitus without complications: Secondary | ICD-10-CM | POA: Diagnosis present

## 2021-01-31 DIAGNOSIS — Z66 Do not resuscitate: Secondary | ICD-10-CM | POA: Diagnosis present

## 2021-01-31 DIAGNOSIS — I110 Hypertensive heart disease with heart failure: Principal | ICD-10-CM | POA: Diagnosis present

## 2021-01-31 DIAGNOSIS — I5033 Acute on chronic diastolic (congestive) heart failure: Secondary | ICD-10-CM | POA: Diagnosis present

## 2021-01-31 DIAGNOSIS — I5023 Acute on chronic systolic (congestive) heart failure: Secondary | ICD-10-CM

## 2021-01-31 DIAGNOSIS — I480 Paroxysmal atrial fibrillation: Secondary | ICD-10-CM | POA: Diagnosis present

## 2021-01-31 DIAGNOSIS — J440 Chronic obstructive pulmonary disease with acute lower respiratory infection: Secondary | ICD-10-CM | POA: Diagnosis present

## 2021-01-31 DIAGNOSIS — Z430 Encounter for attention to tracheostomy: Secondary | ICD-10-CM

## 2021-01-31 DIAGNOSIS — N289 Disorder of kidney and ureter, unspecified: Secondary | ICD-10-CM | POA: Diagnosis present

## 2021-01-31 DIAGNOSIS — Z7951 Long term (current) use of inhaled steroids: Secondary | ICD-10-CM

## 2021-01-31 DIAGNOSIS — J209 Acute bronchitis, unspecified: Secondary | ICD-10-CM | POA: Diagnosis present

## 2021-01-31 DIAGNOSIS — D751 Secondary polycythemia: Secondary | ICD-10-CM | POA: Diagnosis present

## 2021-01-31 DIAGNOSIS — Z20822 Contact with and (suspected) exposure to covid-19: Secondary | ICD-10-CM | POA: Diagnosis present

## 2021-01-31 DIAGNOSIS — J9621 Acute and chronic respiratory failure with hypoxia: Secondary | ICD-10-CM | POA: Diagnosis present

## 2021-01-31 HISTORY — DX: Schizophrenia, unspecified: F20.9

## 2021-01-31 LAB — CBC WITH DIFFERENTIAL/PLATELET
Abs Immature Granulocytes: 0.14 10*3/uL — ABNORMAL HIGH (ref 0.00–0.07)
Basophils Absolute: 0 10*3/uL (ref 0.0–0.1)
Basophils Relative: 0 %
Eosinophils Absolute: 0 10*3/uL (ref 0.0–0.5)
Eosinophils Relative: 0 %
HCT: 63.1 % — ABNORMAL HIGH (ref 39.0–52.0)
Hemoglobin: 19 g/dL — ABNORMAL HIGH (ref 13.0–17.0)
Immature Granulocytes: 2 %
Lymphocytes Relative: 19 %
Lymphs Abs: 1.8 10*3/uL (ref 0.7–4.0)
MCH: 25.6 pg — ABNORMAL LOW (ref 26.0–34.0)
MCHC: 30.1 g/dL (ref 30.0–36.0)
MCV: 85 fL (ref 80.0–100.0)
Monocytes Absolute: 0.8 10*3/uL (ref 0.1–1.0)
Monocytes Relative: 9 %
Neutro Abs: 6.6 10*3/uL (ref 1.7–7.7)
Neutrophils Relative %: 70 %
Platelets: 167 10*3/uL (ref 150–400)
RBC: 7.42 MIL/uL — ABNORMAL HIGH (ref 4.22–5.81)
RDW: 20.8 % — ABNORMAL HIGH (ref 11.5–15.5)
WBC: 9.4 10*3/uL (ref 4.0–10.5)
nRBC: 0.6 % — ABNORMAL HIGH (ref 0.0–0.2)

## 2021-01-31 LAB — SALICYLATE LEVEL: Salicylate Lvl: <7 mg/dL — ABNORMAL LOW (ref 7.0–30.0)

## 2021-01-31 LAB — RAPID URINE DRUG SCREEN, HOSP PERFORMED
Amphetamines: NOT DETECTED
Barbiturates: NOT DETECTED
Benzodiazepines: NOT DETECTED
Cocaine: NOT DETECTED
Opiates: NOT DETECTED
Tetrahydrocannabinol: NOT DETECTED

## 2021-01-31 LAB — ACETAMINOPHEN LEVEL: Acetaminophen (Tylenol), Serum: <10 ug/mL — ABNORMAL LOW (ref 10–30)

## 2021-01-31 LAB — ETHANOL: Alcohol, Ethyl (B): <10 mg/dL (ref <10)

## 2021-01-31 NOTE — Progress Notes (Signed)
Called to evaluate patient for low Spo2. Patient has #6 SH trach, pilot balloon not intact. Suctioned, for moderate thick yellow, clear secretions. Inner cannula contained thick dried tan secretions. Changed DIC, changed trach ties.Placed on 100% ATC. Spo2 improved. Weaned to 40% FIO2, currently 94% SPO2.

## 2021-01-31 NOTE — ED Triage Notes (Addendum)
Pt coming voluntary with officers  from [facility] rehab where reported pt was having inappropriate behavior with staff, calling staff members his wife and is here for a psych eval. Tech reported pt O2 in triage is 84% pt has an trach

## 2021-01-31 NOTE — ED Provider Notes (Signed)
Emergency Medicine Provider Triage Evaluation Note  Tommy Mathews , a 60 y.o. male  was evaluated in triage.  Pt here for psych eval. He was apparently inapropriate at his facility. Denies chest pain or sob. He has no complaints. Denies si, hi, avh.   Review of Systems  Positive: Psych eval, hypoxia Negative: Chest pain  Physical Exam  BP (!) 141/77 (BP Location: Left Arm)   Pulse 90   Temp 98.4 F (36.9 C) (Oral)   Resp 18   SpO2 (!) 84%  Gen:   Awake, no distress   Resp:  Normal effort  MSK:   Moves extremities without difficulty  Other:  Coarse breath sounds throughout  Medical Decision Making  Medically screening exam initiated at 10:26 PM.  Appropriate orders placed.  Tommy Mathews was informed that the remainder of the evaluation will be completed by another provider, this initial triage assessment does not replace that evaluation, and the importance of remaining in the ED until their evaluation is complete.  Informed nursing that pt needs to be put in a room immediately.    Tommy Mathews 01/31/21 2230    Zadie Rhine, MD 02/01/21 Tommy Mathews

## 2021-01-31 NOTE — ED Notes (Signed)
Lab to add on Digoxin and BNP

## 2021-02-01 ENCOUNTER — Observation Stay (HOSPITAL_COMMUNITY): Payer: Medicaid Other

## 2021-02-01 DIAGNOSIS — J209 Acute bronchitis, unspecified: Secondary | ICD-10-CM

## 2021-02-01 DIAGNOSIS — J440 Chronic obstructive pulmonary disease with acute lower respiratory infection: Secondary | ICD-10-CM | POA: Diagnosis present

## 2021-02-01 DIAGNOSIS — I5031 Acute diastolic (congestive) heart failure: Secondary | ICD-10-CM | POA: Diagnosis not present

## 2021-02-01 DIAGNOSIS — F3130 Bipolar disorder, current episode depressed, mild or moderate severity, unspecified: Secondary | ICD-10-CM | POA: Diagnosis not present

## 2021-02-01 DIAGNOSIS — F209 Schizophrenia, unspecified: Secondary | ICD-10-CM

## 2021-02-01 DIAGNOSIS — I5033 Acute on chronic diastolic (congestive) heart failure: Secondary | ICD-10-CM | POA: Diagnosis not present

## 2021-02-01 DIAGNOSIS — D751 Secondary polycythemia: Secondary | ICD-10-CM

## 2021-02-01 DIAGNOSIS — J9621 Acute and chronic respiratory failure with hypoxia: Secondary | ICD-10-CM | POA: Diagnosis not present

## 2021-02-01 DIAGNOSIS — N289 Disorder of kidney and ureter, unspecified: Secondary | ICD-10-CM

## 2021-02-01 LAB — CBC WITH DIFFERENTIAL/PLATELET
Abs Immature Granulocytes: 0.04 10*3/uL (ref 0.00–0.07)
Basophils Absolute: 0 10*3/uL (ref 0.0–0.1)
Basophils Relative: 0 %
Eosinophils Absolute: 0 10*3/uL (ref 0.0–0.5)
Eosinophils Relative: 0 %
HCT: 64 % — ABNORMAL HIGH (ref 39.0–52.0)
Hemoglobin: 18.8 g/dL — ABNORMAL HIGH (ref 13.0–17.0)
Immature Granulocytes: 0 %
Lymphocytes Relative: 22 %
Lymphs Abs: 2.3 10*3/uL (ref 0.7–4.0)
MCH: 25 pg — ABNORMAL LOW (ref 26.0–34.0)
MCHC: 29.4 g/dL — ABNORMAL LOW (ref 30.0–36.0)
MCV: 85.1 fL (ref 80.0–100.0)
Monocytes Absolute: 0.8 10*3/uL (ref 0.1–1.0)
Monocytes Relative: 8 %
Neutro Abs: 7 10*3/uL (ref 1.7–7.7)
Neutrophils Relative %: 70 %
Platelets: 169 10*3/uL (ref 150–400)
RBC: 7.52 MIL/uL — ABNORMAL HIGH (ref 4.22–5.81)
RDW: 21.2 % — ABNORMAL HIGH (ref 11.5–15.5)
WBC: 10.2 10*3/uL (ref 4.0–10.5)
nRBC: 0.2 % (ref 0.0–0.2)

## 2021-02-01 LAB — ECHOCARDIOGRAM COMPLETE
Area-P 1/2: 3.39 cm2
Height: 72 in
S' Lateral: 3.2 cm
Weight: 3595.2 oz

## 2021-02-01 LAB — BASIC METABOLIC PANEL
Anion gap: 6 (ref 5–15)
BUN: 17 mg/dL (ref 6–20)
CO2: 32 mmol/L (ref 22–32)
Calcium: 8.5 mg/dL — ABNORMAL LOW (ref 8.9–10.3)
Chloride: 99 mmol/L (ref 98–111)
Creatinine, Ser: 1.11 mg/dL (ref 0.61–1.24)
GFR, Estimated: >60 mL/min (ref >60)
Glucose, Bld: 110 mg/dL — ABNORMAL HIGH (ref 70–99)
Potassium: 4.8 mmol/L (ref 3.5–5.1)
Sodium: 137 mmol/L (ref 135–145)

## 2021-02-01 LAB — RESP PANEL BY RT-PCR (FLU A&B, COVID) ARPGX2
Influenza A by PCR: NEGATIVE
Influenza B by PCR: NEGATIVE
SARS Coronavirus 2 by RT PCR: NEGATIVE

## 2021-02-01 LAB — COMPREHENSIVE METABOLIC PANEL
ALT: 12 U/L (ref 0–44)
AST: 22 U/L (ref 15–41)
Albumin: 3.1 g/dL — ABNORMAL LOW (ref 3.5–5.0)
Alkaline Phosphatase: 58 U/L (ref 38–126)
Anion gap: 10 (ref 5–15)
BUN: 23 mg/dL — ABNORMAL HIGH (ref 6–20)
CO2: 28 mmol/L (ref 22–32)
Calcium: 8.7 mg/dL — ABNORMAL LOW (ref 8.9–10.3)
Chloride: 99 mmol/L (ref 98–111)
Creatinine, Ser: 1.36 mg/dL — ABNORMAL HIGH (ref 0.61–1.24)
GFR, Estimated: >60 mL/min (ref >60)
Glucose, Bld: 223 mg/dL — ABNORMAL HIGH (ref 70–99)
Potassium: 4.2 mmol/L (ref 3.5–5.1)
Sodium: 137 mmol/L (ref 135–145)
Total Bilirubin: 0.7 mg/dL (ref 0.3–1.2)
Total Protein: 7 g/dL (ref 6.5–8.1)

## 2021-02-01 LAB — SAVE SMEAR(SSMR), FOR PROVIDER SLIDE REVIEW

## 2021-02-01 LAB — GLUCOSE, CAPILLARY
Glucose-Capillary: 123 mg/dL — ABNORMAL HIGH (ref 70–99)
Glucose-Capillary: 183 mg/dL — ABNORMAL HIGH (ref 70–99)
Glucose-Capillary: 177 mg/dL — ABNORMAL HIGH (ref 70–99)

## 2021-02-01 LAB — DIGOXIN LEVEL: Digoxin Level: 0.7 ng/mL — ABNORMAL LOW (ref 0.8–2.0)

## 2021-02-01 LAB — HEMOGLOBIN A1C
Hgb A1c MFr Bld: 7 % — ABNORMAL HIGH (ref 4.8–5.6)
Mean Plasma Glucose: 154.2 mg/dL

## 2021-02-01 LAB — PROCALCITONIN: Procalcitonin: <0.1 ng/mL

## 2021-02-01 LAB — BRAIN NATRIURETIC PEPTIDE: B Natriuretic Peptide: 164.1 pg/mL — ABNORMAL HIGH (ref 0.0–100.0)

## 2021-02-01 MED ORDER — DOCUSATE SODIUM 100 MG PO CAPS
100.0000 mg | ORAL_CAPSULE | Freq: Two times a day (BID) | ORAL | Status: DC
  Administered 2021-02-01 – 2021-02-10 (×12): 100 mg via ORAL
  Filled 2021-02-01 (×18): qty 1

## 2021-02-01 MED ORDER — GUAIFENESIN ER 600 MG PO TB12
600.0000 mg | ORAL_TABLET | Freq: Two times a day (BID) | ORAL | Status: DC
  Administered 2021-02-01 – 2021-02-11 (×21): 600 mg via ORAL
  Filled 2021-02-01 (×21): qty 1

## 2021-02-01 MED ORDER — PREDNISONE 20 MG PO TABS
20.0000 mg | ORAL_TABLET | Freq: Two times a day (BID) | ORAL | Status: AC
  Administered 2021-02-02 (×2): 20 mg via ORAL
  Filled 2021-02-01 (×2): qty 1

## 2021-02-01 MED ORDER — DIGOXIN 125 MCG PO TABS
0.2500 mg | ORAL_TABLET | Freq: Every day | ORAL | Status: DC
  Administered 2021-02-01 – 2021-02-11 (×10): 0.25 mg via ORAL
  Filled 2021-02-01 (×12): qty 2

## 2021-02-01 MED ORDER — BISACODYL 10 MG RE SUPP: 10.0000 mg | Freq: Every day | RECTAL | Status: DC | PRN

## 2021-02-01 MED ORDER — FUROSEMIDE 10 MG/ML IJ SOLN
40.0000 mg | Freq: Once | INTRAMUSCULAR | Status: AC
  Administered 2021-02-01: 40 mg via INTRAVENOUS
  Filled 2021-02-01: qty 4

## 2021-02-01 MED ORDER — DIVALPROEX SODIUM 500 MG PO DR TAB
500.0000 mg | DELAYED_RELEASE_TABLET | Freq: Two times a day (BID) | ORAL | Status: DC
  Administered 2021-02-01 – 2021-02-10 (×20): 500 mg via ORAL
  Filled 2021-02-01 (×23): qty 1

## 2021-02-01 MED ORDER — METOPROLOL SUCCINATE ER 25 MG PO TB24
25.0000 mg | ORAL_TABLET | Freq: Every day | ORAL | Status: DC
  Administered 2021-02-02 – 2021-02-11 (×9): 25 mg via ORAL
  Filled 2021-02-01 (×9): qty 1

## 2021-02-01 MED ORDER — LISINOPRIL 10 MG PO TABS
10.0000 mg | ORAL_TABLET | Freq: Every day | ORAL | Status: DC
  Administered 2021-02-01 – 2021-02-11 (×11): 10 mg via ORAL
  Filled 2021-02-01 (×11): qty 1

## 2021-02-01 MED ORDER — QUETIAPINE FUMARATE 100 MG PO TABS
200.0000 mg | ORAL_TABLET | Freq: Every day | ORAL | Status: DC
  Administered 2021-02-01 – 2021-02-10 (×10): 200 mg via ORAL
  Filled 2021-02-01 (×10): qty 2

## 2021-02-01 MED ORDER — SODIUM CHLORIDE 0.9% FLUSH: 3.0000 mL | INTRAVENOUS | Status: DC | PRN

## 2021-02-01 MED ORDER — SACCHAROMYCES BOULARDII 250 MG PO CAPS
250.0000 mg | ORAL_CAPSULE | Freq: Two times a day (BID) | ORAL | Status: DC
  Administered 2021-02-01 – 2021-02-11 (×21): 250 mg via ORAL
  Filled 2021-02-01 (×21): qty 1

## 2021-02-01 MED ORDER — INSULIN ASPART 100 UNIT/ML IJ SOLN
0.0000 [IU] | Freq: Three times a day (TID) | INTRAMUSCULAR | Status: DC
  Administered 2021-02-01 – 2021-02-02 (×2): 2 [IU] via SUBCUTANEOUS
  Administered 2021-02-02: 1 [IU] via SUBCUTANEOUS
  Administered 2021-02-03: 2 [IU] via SUBCUTANEOUS
  Administered 2021-02-03: 3 [IU] via SUBCUTANEOUS
  Administered 2021-02-03: 5 [IU] via SUBCUTANEOUS
  Administered 2021-02-04: 2 [IU] via SUBCUTANEOUS
  Administered 2021-02-04: 3 [IU] via SUBCUTANEOUS
  Administered 2021-02-05: 1 [IU] via SUBCUTANEOUS
  Administered 2021-02-05 – 2021-02-06 (×3): 3 [IU] via SUBCUTANEOUS
  Administered 2021-02-06 – 2021-02-07 (×2): 5 [IU] via SUBCUTANEOUS
  Administered 2021-02-07 (×2): 3 [IU] via SUBCUTANEOUS
  Administered 2021-02-08 (×2): 5 [IU] via SUBCUTANEOUS
  Administered 2021-02-08: 3 [IU] via SUBCUTANEOUS
  Administered 2021-02-09: 5 [IU] via SUBCUTANEOUS
  Administered 2021-02-09: 3 [IU] via SUBCUTANEOUS
  Administered 2021-02-09: 1 [IU] via SUBCUTANEOUS
  Administered 2021-02-10: 3 [IU] via SUBCUTANEOUS
  Administered 2021-02-10 (×2): 7 [IU] via SUBCUTANEOUS
  Administered 2021-02-11: 2 [IU] via SUBCUTANEOUS

## 2021-02-01 MED ORDER — ASPIRIN EC 81 MG PO TBEC
81.0000 mg | DELAYED_RELEASE_TABLET | Freq: Every day | ORAL | Status: DC
  Administered 2021-02-02 – 2021-02-11 (×10): 81 mg via ORAL
  Filled 2021-02-01 (×10): qty 1

## 2021-02-01 MED ORDER — NITROGLYCERIN 2 % TD OINT
0.5000 [in_us] | TOPICAL_OINTMENT | Freq: Once | TRANSDERMAL | Status: AC
  Administered 2021-02-01: 0.5 [in_us] via TOPICAL
  Filled 2021-02-01: qty 1

## 2021-02-01 MED ORDER — AMOXICILLIN-POT CLAVULANATE 500-125 MG PO TABS
1.0000 | ORAL_TABLET | Freq: Three times a day (TID) | ORAL | Status: AC
  Administered 2021-02-01 – 2021-02-08 (×21): 500 mg via ORAL
  Filled 2021-02-01 (×21): qty 1

## 2021-02-01 MED ORDER — METOPROLOL SUCCINATE ER 25 MG PO TB24: 25.0000 mg | ORAL_TABLET | Freq: Every day | ORAL | Status: DC

## 2021-02-01 MED ORDER — FLUOCINOLONE ACETONIDE SCALP 0.01 % EX OIL: 1.0000 "application " | TOPICAL_OIL | Freq: Every day | CUTANEOUS | Status: DC

## 2021-02-01 MED ORDER — ACETYLCYSTEINE 20 % IN SOLN
4.0000 mL | Freq: Two times a day (BID) | RESPIRATORY_TRACT | Status: DC | PRN
  Filled 2021-02-01: qty 4

## 2021-02-01 MED ORDER — PANTOPRAZOLE SODIUM 40 MG PO PACK
40.0000 mg | PACK | Freq: Every day | ORAL | Status: DC
  Administered 2021-02-01 – 2021-02-11 (×11): 40 mg via ORAL
  Filled 2021-02-01 (×12): qty 20

## 2021-02-01 MED ORDER — GABAPENTIN 100 MG PO CAPS
200.0000 mg | ORAL_CAPSULE | Freq: Two times a day (BID) | ORAL | Status: DC
  Administered 2021-02-01 – 2021-02-11 (×21): 200 mg via ORAL
  Filled 2021-02-01 (×21): qty 2

## 2021-02-01 MED ORDER — ACETAMINOPHEN 325 MG PO TABS: 650.0000 mg | ORAL_TABLET | ORAL | Status: DC | PRN

## 2021-02-01 MED ORDER — GUAIFENESIN ER 600 MG PO TB12: 600.0000 mg | ORAL_TABLET | Freq: Two times a day (BID) | ORAL | Status: DC

## 2021-02-01 MED ORDER — SODIUM CHLORIDE 3 % IN NEBU
4.0000 mL | INHALATION_SOLUTION | Freq: Every day | RESPIRATORY_TRACT | Status: AC
  Administered 2021-02-01 – 2021-02-03 (×3): 4 mL via RESPIRATORY_TRACT
  Filled 2021-02-01 (×3): qty 4

## 2021-02-01 MED ORDER — MAGNESIUM OXIDE -MG SUPPLEMENT 400 (240 MG) MG PO TABS
400.0000 mg | ORAL_TABLET | Freq: Two times a day (BID) | ORAL | Status: DC
  Administered 2021-02-01 – 2021-02-11 (×21): 400 mg via ORAL
  Filled 2021-02-01 (×21): qty 1

## 2021-02-01 MED ORDER — BUDESONIDE 0.5 MG/2ML IN SUSP
0.5000 mg | Freq: Two times a day (BID) | RESPIRATORY_TRACT | Status: DC
  Administered 2021-02-01 – 2021-02-11 (×21): 0.5 mg via RESPIRATORY_TRACT
  Filled 2021-02-01 (×20): qty 2

## 2021-02-01 MED ORDER — ONDANSETRON HCL 4 MG/2ML IJ SOLN: 4.0000 mg | Freq: Four times a day (QID) | INTRAMUSCULAR | Status: DC | PRN

## 2021-02-01 MED ORDER — FLUOXETINE HCL 20 MG PO CAPS
20.0000 mg | ORAL_CAPSULE | Freq: Every day | ORAL | Status: DC
  Administered 2021-02-02 – 2021-02-11 (×10): 20 mg via ORAL
  Filled 2021-02-01 (×10): qty 1

## 2021-02-01 MED ORDER — CLONAZEPAM 0.25 MG PO TBDP
0.2500 mg | ORAL_TABLET | Freq: Two times a day (BID) | ORAL | Status: DC
  Administered 2021-02-01 – 2021-02-11 (×21): 0.25 mg via ORAL
  Filled 2021-02-01 (×20): qty 1

## 2021-02-01 MED ORDER — SODIUM CHLORIDE 0.9 % IV SOLN: 250.0000 mL | INTRAVENOUS | Status: DC | PRN

## 2021-02-01 MED ORDER — IPRATROPIUM-ALBUTEROL 0.5-2.5 (3) MG/3ML IN SOLN: 3.0000 mL | RESPIRATORY_TRACT | Status: DC | PRN

## 2021-02-01 MED ORDER — FUROSEMIDE 10 MG/ML IJ SOLN
40.0000 mg | Freq: Two times a day (BID) | INTRAMUSCULAR | Status: DC
  Administered 2021-02-01: 40 mg via INTRAVENOUS
  Filled 2021-02-01: qty 4

## 2021-02-01 MED ORDER — CLOBETASOL PROPIONATE 0.05 % EX CREA
1.0000 "application " | TOPICAL_CREAM | Freq: Two times a day (BID) | CUTANEOUS | Status: DC
  Administered 2021-02-02 – 2021-02-11 (×16): 1 via TOPICAL
  Filled 2021-02-01: qty 15

## 2021-02-01 MED ORDER — ENOXAPARIN SODIUM 40 MG/0.4ML IJ SOSY
40.0000 mg | PREFILLED_SYRINGE | INTRAMUSCULAR | Status: DC
  Administered 2021-02-01 – 2021-02-10 (×10): 40 mg via SUBCUTANEOUS
  Filled 2021-02-01 (×10): qty 0.4

## 2021-02-01 MED ORDER — SODIUM CHLORIDE 0.9% FLUSH
3.0000 mL | Freq: Two times a day (BID) | INTRAVENOUS | Status: DC
  Administered 2021-02-01 – 2021-02-10 (×18): 3 mL via INTRAVENOUS

## 2021-02-01 NOTE — Progress Notes (Signed)
Patient admitted from [facility] ED accompanied by RN to [address] Room 10,attached to continous cardiac monitoring,CCMD notified,V/S checked,patient oriented to the room and staff,CHG wipes done.No admission orders,Admittting department for [facility] notified.Will continue to monitor the patient.

## 2021-02-01 NOTE — ED Provider Notes (Signed)
[facility] EMERGENCY DEPARTMENT Provider Note   CSN: [id number] Arrival date & time: 01/31/21  2159     History Chief Complaint  Patient presents with  . Psychiatric Evaluation   Level 5 caveat due to acuity of condition Tommy Mathews is a 60 y.o. male.  HPI    Patient with history of diabetes, GERD, hypertension, schizophrenia, CHF, tracheostomy in place presents from nursing facility for psychiatric evaluation.  Per nursing home, patient has been making inappropriate remarks to male staff member.  Patient tells me that he thinks he has bronchitis and feels short of breath. Patient denies any chest pain.  He does report some recent cough.  Past Medical History:  Diagnosis Date  . Diabetes (HCC)   . GERD (gastroesophageal reflux disease)   . HLD (hyperlipidemia)   . HTN (hypertension)     Patient Active Problem List   Diagnosis Date Noted  . Acute on chronic systolic CHF (congestive heart failure) (HCC) 12/14/2019  . COPD (chronic obstructive pulmonary disease) (HCC)   . Sepsis with acute hypoxic respiratory failure without septic shock (HCC)   . Chronic HFrEF (heart failure with reduced ejection fraction) (HCC) 08/24/2019  . Thrombocytopenia (HCC) 08/24/2019  . 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/2018  . Bipolar I disorder, most recent episode depressed (HCC) 03/03/2018  . Schizophrenia (HCC) 02/27/2018  . GI bleed 01/06/2018  . Acute respiratory failure with hypoxia (HCC) 01/06/2018  . Pneumonia of both lungs due to infectious organism 01/06/2018  . Insulin-requiring or dependent type II diabetes mellitus (HCC) 01/06/2018    Past Surgical History:  Procedure Laterality Date  . IR GASTROSTOMY TUBE REMOVAL  08/15/2018  . PEG PLACEMENT N/A 01/21/2018   Procedure: PERCUTANEOUS ENDOSCOPIC GASTROSTOMY (PEG) PLACEMENT;  Surgeon: Wyline Mood, MD;  Location: [facility] ENDOSCOPY;  Service: Gastroenterology;  Laterality: N/A;   . TRACHEOSTOMY TUBE PLACEMENT N/A 01/23/2018   Procedure: TRACHEOSTOMY;  Surgeon: Bud Face, MD;  Location: [facility] ORS;  Service: ENT;  Laterality: N/A;       No family history on file.  Social History   Tobacco Use  . Smoking status: Current Every Day Smoker    Packs/day: 1.00    Years: 40.00    Pack years: 40.00    Types: Cigarettes    Start date: 2000  . Smokeless tobacco: Never Used  Vaping Use  . Vaping Use: Never used  Substance Use Topics  . Alcohol use: Never  . Drug use: Never    Home Medications Prior to Admission medications   Medication Sig Start Date End Date Taking? Authorizing Provider  acetylcysteine (MUCOMYST) 10% SOLN Take 4 mLs by nebulization every 12 (twelve) hours as needed (secretions). 03/19/18   Shaune Pollack, MD  bisacodyl (DULCOLAX) 10 MG suppository Place 1 suppository (10 mg total) rectally daily as needed for moderate constipation. 03/19/18   Shaune Pollack, MD  budesonide (PULMICORT) 0.5 MG/2ML nebulizer solution Take 2 mLs (0.5 mg total) by nebulization 2 (two) times daily. 03/19/18   Shaune Pollack, MD  clonazePAM (KLONOPIN) 0.5 MG tablet Take 0.5 tablets (0.25 mg total) by mouth 2 (two) times daily. 12/20/19   Almon Hercules, MD  digoxin (LANOXIN) 0.25 MG tablet Take 1 tablet (0.25 mg total) by mouth daily. 09/02/19   Elige Radon, MD  divalproex (DEPAKOTE) 500 MG DR tablet Take 500 mg by mouth 2 (two) times daily.    [provider]  docusate sodium (COLACE) 100 MG capsule Take 100 mg by mouth  2 (two) times daily.    [provider]  fluconazole (DIFLUCAN) 100 MG tablet Take 1 tablet (100 mg total) by mouth daily. 12/21/19   Almon HerculesGonfa, Taye T, MD  FLUoxetine (PROZAC) 20 MG capsule Take 1 capsule (20 mg total) by mouth daily. 09/02/19   Elige Radonhristian, Rylee, MD  furosemide (LASIX) 40 MG tablet Take 1 tablet (40 mg total) by mouth 2 (two) times daily. 12/20/19   Almon HerculesGonfa, Taye T, MD  gabapentin (NEURONTIN) 100 MG capsule Take 200 mg by mouth every  12 (twelve) hours.    [provider]  guaiFENesin (MUCINEX) 600 MG 12 hr tablet Take 600 mg by mouth 2 (two) times daily.    [provider]  insulin glargine (LANTUS) 100 UNIT/ML injection Inject 0.2 mLs (20 Units total) into the skin at bedtime. 03/19/18   Shaune Pollackhen, Qing, MD  ipratropium-albuterol (DUONEB) 0.5-2.5 (3) MG/3ML SOLN Take 3 mLs by nebulization every 4 (four) hours as needed. 03/19/18   Shaune Pollackhen, Qing, MD  ipratropium-albuterol (DUONEB) 0.5-2.5 (3) MG/3ML SOLN Take 3 mLs by nebulization every 4 (four) hours. 03/19/18   Shaune Pollackhen, Qing, MD  lisinopril (ZESTRIL) 10 MG tablet Take 1 tablet (10 mg total) by mouth daily. 09/02/19   Elige Radonhristian, Rylee, MD  magnesium oxide (MAG-OX) 400 (241.3 Mg) MG tablet Place 1 tablet (400 mg total) into feeding tube 2 (two) times daily. Patient taking differently: Take 400 mg by mouth 2 (two) times daily.  03/19/18   Shaune Pollackhen, Qing, MD  metoprolol succinate (TOPROL XL) 25 MG 24 hr tablet Take 1 tablet (25 mg total) by mouth daily. 12/20/19 12/19/20  Almon HerculesGonfa, Taye T, MD  pantoprazole sodium (PROTONIX) 40 mg/20 mL PACK Place 20 mLs (40 mg total) into feeding tube daily. Patient taking differently: Take 40 mg by mouth daily. 40 mg tablet daily 03/20/18   Shaune Pollackhen, Qing, MD  QUEtiapine (SEROQUEL) 200 MG tablet Take 1 tablet (200 mg total) by mouth at bedtime. 09/01/19   Christian, Rylee, MD  sodium chloride HYPERTONIC 3 % nebulizer solution Take 4 mLs by nebulization daily. 03/20/18   Shaune Pollackhen, Qing, MD    Allergies    Patient has no known allergies.  Review of Systems   Review of Systems  Unable to perform ROS: Acuity of condition    Physical Exam Updated Vital Signs BP (!) 155/94   Pulse 64   Temp 98.4 F (36.9 C) (Oral)   Resp (!) 22   SpO2 93%   Physical Exam CONSTITUTIONAL: Chronically ill-appearing, mild distress noted HEAD: Normocephalic/atraumatic EYES: EOMI/PERRL ENMT: Mucous membranes moist NECK: supple no meningeal signs, trach is in place small  amount of clear discharge noted, no bleeding SPINE/BACK:entire spine nontender CV: S1/S2 noted LUNGS: Tachypnea, crackles bilaterally ABDOMEN: soft, nontender, no rebound or guarding, bowel sounds noted throughout abdomen GU:no cva tenderness NEURO: Pt is awake/alert/appropriate, moves all extremitiesx4.   EXTREMITIES: pulses normal/equal, full ROM SKIN: warm, color normal PSYCH: Flat affect  ED Results / Procedures / Treatments   Labs (all labs ordered are listed, but only abnormal results are displayed) Labs Reviewed  CBC WITH DIFFERENTIAL/PLATELET - Abnormal; Notable for the following components:      Result Value   RBC 7.42 (*)    Hemoglobin 19.0 (*)    HCT 63.1 (*)    MCH 25.6 (*)    RDW 20.8 (*)    nRBC 0.6 (*)    Abs Immature Granulocytes 0.14 (*)    All other components within normal limits  COMPREHENSIVE METABOLIC PANEL -  Abnormal; Notable for the following components:   Glucose, Bld 223 (*)    BUN 23 (*)    Creatinine, Ser 1.36 (*)    Calcium 8.7 (*)    Albumin 3.1 (*)    All other components within normal limits  ACETAMINOPHEN LEVEL - Abnormal; Notable for the following components:   Acetaminophen (Tylenol), Serum <10 (*)    All other components within normal limits  SALICYLATE LEVEL - Abnormal; Notable for the following components:   Salicylate Lvl <7.0 (*)    All other components within normal limits  DIGOXIN LEVEL - Abnormal; Notable for the following components:   Digoxin Level 0.7 (*)    All other components within normal limits  BRAIN NATRIURETIC PEPTIDE - Abnormal; Notable for the following components:   B Natriuretic Peptide 164.1 (*)    All other components within normal limits  RESP PANEL BY RT-PCR (FLU A&B, COVID) ARPGX2  ETHANOL  RAPID URINE DRUG SCREEN, HOSP PERFORMED    EKG EKG Interpretation  Date/Time:  Tuesday Jan 31 2021 23:08:46 EDT Ventricular Rate:  79 PR Interval:  141 QRS Duration: 98 QT Interval:  330 QTC Calculation: 379 R  Axis:   6 Text Interpretation: Sinus rhythm Probable left atrial enlargement Borderline repolarization abnormality Confirmed by Zadie Rhine ([id number]) on 01/31/2021 11:18:55 PM   Radiology DG Chest Port 1 View  Result Date: 01/31/2021 CLINICAL DATA:  Hypoxia. EXAM: PORTABLE CHEST 1 VIEW COMPARISON:  December 19, 2019 FINDINGS: There is stable tracheostomy tube positioning. Mildly increased interstitial lung markings are seen within the mid and lower lung fields. Mild linear atelectasis is noted within the mid right lung. Very small bilateral pleural effusions are seen. No pneumothorax is identified. The cardiac silhouette is mildly enlarged and unchanged in size. The visualized skeletal structures are unremarkable. IMPRESSION: 1. Increased interstitial lung markings within the mid and lower lung fields which is suspicious for mild infiltrates with a mild amount of superimposed interstitial edema. 2. Very small bilateral pleural effusions. Electronically Signed   By: Aram Candela M.D.   On: 01/31/2021 23:32    Procedures .Critical Care E&M Performed by: Zadie Rhine, MD  Critical care provider statement:    Critical care time (minutes):  60   Critical care start time:  01/31/2021 11:30 PM   Critical care end time:  02/01/2021 12:30 AM   Critical care time was exclusive of:  Separately billable procedures and treating other patients   Critical care was necessary to treat or prevent imminent or life-threatening deterioration of the following conditions:  Respiratory failure   Critical care was time spent personally by me on the following activities:  Discussions with consultants, development of treatment plan with patient or surrogate, examination of patient, obtaining history from patient or surrogate, review of old charts, re-evaluation of patient's condition, pulse oximetry, ordering and performing treatments and interventions, ordering and review of laboratory studies and ordering and review of  radiographic studies   I assumed direction of critical care for this patient from another provider in my specialty: no     Care discussed with: admitting provider   After initial E/M assessment, critical care services were subsequently performed that were exclusive of separately billable procedures or treatment.       Medications Ordered in ED Medications  furosemide (LASIX) injection 40 mg (has no administration in time range)  nitroGLYCERIN (NITROGLYN) 2 % ointment 0.5 inch (0.5 inches Topical Given 02/01/21 0248)    ED Course  I have reviewed the  triage vital signs and the nursing notes.  Pertinent labs & imaging results that were available during my care of the patient were reviewed by me and considered in my medical decision making (see chart for details).    MDM Rules/Calculators/A&P                          12:26 AM Patient presents from nursing facility after reportedly needing a psych evaluation.  When I spoke to the nurse at the facility, she reports the patient has been verbally harassing staff member, and the physician in charge recommended ER visit for psychiatric evaluation.  On arrival to this ER, patient was hypoxic and had crackles bilaterally.  Patient required tracheostomy suctioning with some improvement in his oxygenation. Medical evaluation will be started.  Patient appears to be psychiatrically stable at this time 4:01 AM Patient monitored for several hours.  He is still requiring a higher level of oxygen that is his baseline.  Pulse ox is now in the mid 90s.  Overall his work of breathing has improved.  Given the elevated blood pressure, and x-ray findings will treat for presumed CHF.  Lasix and nitroglycerin have been ordered. Discussed with Dr. Julian Reil for admission. Final Clinical Impression(s) / ED Diagnoses Final diagnoses:  Acute respiratory failure with hypoxia (HCC)  Acute on chronic systolic congestive heart failure [facility])    Rx / DC Orders ED  Discharge Orders    None       Zadie Rhine, MD 02/01/21 [id number]

## 2021-02-01 NOTE — Progress Notes (Signed)
  Echocardiogram 2D Echocardiogram has been performed.  Tommy Mathews 02/01/2021, 3:53 PM

## 2021-02-01 NOTE — Progress Notes (Signed)
Mobility Specialist - Progress Note   02/01/21 1507  Mobility  Activity Dangled on edge of bed  Level of Assistance Standby assist, set-up cues, supervision of patient - no hands on  Assistive Device None  Mobility Response Tolerated well  Mobility performed by Mobility specialist  $Mobility charge 1 Mobility   Pt dangled on edge of bed and did lower extremity exercises. His HR remained ~55 and SpO2 was ~92%. Pt back in bed after mobility.   Mamie Levers Mobility Specialist Mobility Specialist Phone: [phone]

## 2021-02-01 NOTE — H&P (Addendum)
History and Physical    Tommy Mathews DGL:[id number] DOB: 03-Jul-1961 DOA: 01/31/2021  Referring MD/NP/PA: Lyda Perone, MD PCP: Georgann Housekeeper, MD  Patient coming from: [place] via [facility] [employer]  Chief Complaint: Inappropriate behavior  I have personally briefly reviewed patient's old medical records in [facility]   HPI: Tommy Mathews is a 60 y.o. male with medical history significant of HTN, DM type II, systolic CHF, paroxysmal atrial fibrillation not on anticoagulation  2/2 history of GI bleeds, COPD, TBI s/p tracheostomy, bipolar disorder, schizophrenia, and GERD presenting after patient reported to have been harassing staff member had physician in charge recommended ER visit for psychiatric evaluation.  Patient only notes that he had gotten some kind of disagreement with one of the staff members there.  He states that over the last couple days he has had productive cough with greenish sputum production and has been feeling short of breath.  He denies having any significant fever, nausea, vomiting, chest pain, change in vision, or diarrhea symptoms.  After talking to staff at [facility] it appears patient had been started on Augmentin 500-125 mg to complete a 7-day course as well as prednisone.  ED Course: Upon admission into the emergency department patient was seen to be afebrile pulse 55-91, respiration 15-26, blood pressures 141/77-170 8/96, and O2 saturations noted to be as low as 77% with improvement after patient was suctioned currently on oxygen at 10 L/h.  Labs significant for hemoglobin 19, BUN 23, creatinine 1.36, glucose 223, digoxin 0.7, and BNP 164.1.  Influenza and COVID-19 screening were negative.  Chest x-ray significant for's within the mid and lower lung fields suspicious of mild infiltrates and a mild amount of superimposed interstitial edema with very small bilateral pleural effusions.  Patient has been given 40 mg of Lasix IV in ED.  Review of  Systems  Respiratory: Positive for cough, sputum production and shortness of breath.   Otherwise a complete 10 point review of systems was performed and negative except for as noted above in HPI  Past Medical History:  Diagnosis Date  . Diabetes (HCC)   . GERD (gastroesophageal reflux disease)   . HLD (hyperlipidemia)   . HTN (hypertension)     Past Surgical History:  Procedure Laterality Date  . IR GASTROSTOMY TUBE REMOVAL  08/15/2018  . PEG PLACEMENT N/A 01/21/2018   Procedure: PERCUTANEOUS ENDOSCOPIC GASTROSTOMY (PEG) PLACEMENT;  Surgeon: Wyline Mood, MD;  Location: [facility] ENDOSCOPY;  Service: Gastroenterology;  Laterality: N/A;  . TRACHEOSTOMY TUBE PLACEMENT N/A 01/23/2018   Procedure: TRACHEOSTOMY;  Surgeon: Bud Face, MD;  Location: [facility] ORS;  Service: ENT;  Laterality: N/A;     reports that he has been smoking cigarettes. He started smoking about 22 years ago. He has a 40.00 pack-year smoking history. He has never used smokeless tobacco. He reports that he does not drink alcohol and does not use drugs.  No Known Allergies    Prior to Admission medications   Medication Sig Start Date End Date Taking? Authorizing Provider  acetylcysteine (MUCOMYST) 10% SOLN Take 4 mLs by nebulization every 12 (twelve) hours as needed (secretions). 03/19/18   Shaune Pollack, MD  bisacodyl (DULCOLAX) 10 MG suppository Place 1 suppository (10 mg total) rectally daily as needed for moderate constipation. 03/19/18   Shaune Pollack, MD  budesonide (PULMICORT) 0.5 MG/2ML nebulizer solution Take 2 mLs (0.5 mg total) by nebulization 2 (two) times daily. 03/19/18   Shaune Pollack, MD  clonazePAM (KLONOPIN) 0.5 MG tablet Take 0.5 tablets (  0.25 mg total) by mouth 2 (two) times daily. 12/20/19   Almon HerculesGonfa, Taye T, MD  digoxin (LANOXIN) 0.25 MG tablet Take 1 tablet (0.25 mg total) by mouth daily. 09/02/19   Elige Radonhristian, Rylee, MD  divalproex (DEPAKOTE) 500 MG DR tablet Take 500 mg by mouth 2 (two) times daily.    [provider]  docusate sodium (COLACE) 100 MG capsule Take 100 mg by mouth 2 (two) times daily.    [provider]  fluconazole (DIFLUCAN) 100 MG tablet Take 1 tablet (100 mg total) by mouth daily. 12/21/19   Almon HerculesGonfa, Taye T, MD  FLUoxetine (PROZAC) 20 MG capsule Take 1 capsule (20 mg total) by mouth daily. 09/02/19   Elige Radonhristian, Rylee, MD  furosemide (LASIX) 40 MG tablet Take 1 tablet (40 mg total) by mouth 2 (two) times daily. 12/20/19   Almon HerculesGonfa, Taye T, MD  gabapentin (NEURONTIN) 100 MG capsule Take 200 mg by mouth every 12 (twelve) hours.    [provider]  guaiFENesin (MUCINEX) 600 MG 12 hr tablet Take 600 mg by mouth 2 (two) times daily.    [provider]  insulin glargine (LANTUS) 100 UNIT/ML injection Inject 0.2 mLs (20 Units total) into the skin at bedtime. 03/19/18   Shaune Pollackhen, Qing, MD  ipratropium-albuterol (DUONEB) 0.5-2.5 (3) MG/3ML SOLN Take 3 mLs by nebulization every 4 (four) hours as needed. 03/19/18   Shaune Pollackhen, Qing, MD  ipratropium-albuterol (DUONEB) 0.5-2.5 (3) MG/3ML SOLN Take 3 mLs by nebulization every 4 (four) hours. 03/19/18   Shaune Pollackhen, Qing, MD  lisinopril (ZESTRIL) 10 MG tablet Take 1 tablet (10 mg total) by mouth daily. 09/02/19   Elige Radonhristian, Rylee, MD  magnesium oxide (MAG-OX) 400 (241.3 Mg) MG tablet Place 1 tablet (400 mg total) into feeding tube 2 (two) times daily. Patient taking differently: Take 400 mg by mouth 2 (two) times daily.  03/19/18   Shaune Pollackhen, Qing, MD  metoprolol succinate (TOPROL XL) 25 MG 24 hr tablet Take 1 tablet (25 mg total) by mouth daily. 12/20/19 12/19/20  Almon HerculesGonfa, Taye T, MD  pantoprazole sodium (PROTONIX) 40 mg/20 mL PACK Place 20 mLs (40 mg total) into feeding tube daily. Patient taking differently: Take 40 mg by mouth daily. 40 mg tablet daily 03/20/18   Shaune Pollackhen, Qing, MD  QUEtiapine (SEROQUEL) 200 MG tablet Take 1 tablet (200 mg total) by mouth at bedtime. 09/01/19   Christian, Rylee, MD  sodium chloride HYPERTONIC 3 % nebulizer solution Take 4 mLs  by nebulization daily. 03/20/18   Shaune Pollackhen, Qing, MD    Physical Exam:  Constitutional: Middle-age male currently in no acute distress Vitals:   02/01/21 0555 02/01/21 0805 02/01/21 0809 02/01/21 0904  BP:  97/80 97/80   Pulse: (!) 59 63 63 (!) 54  Resp: 15 17 17 16   Temp:  (!) 97.5 F (36.4 C) (!) 97.5 F (36.4 C)   TempSrc:  Oral Oral   SpO2: 94% 91% 91% 92%  Weight:      Height:       Eyes: PERRL, lids and conjunctivae normal ENMT: Mucous membranes are moist. Posterior pharynx clear of any exudate or lesions.  Neck: Tracheostomy in place and capped at this time. Respiratory: Normal respiratory effort with crackles appreciated in the mid to lower lung fields.  Patient appears able to talk in relatively complete sentences and has oxygen at 10L/min with O2 saturations maintained Cardiovascular: Regular rate and rhythm, no murmurs / rubs / gallops.  Trace lower extremity edema. 2+ pedal pulses. No carotid  bruits.  Abdomen: no tenderness, no masses palpated. No hepatosplenomegaly. Bowel sounds positive.  Musculoskeletal: no clubbing / cyanosis. No joint deformity upper and lower extremities. Good ROM, no contractures. Normal muscle tone.  Skin: no rashes, lesions, ulcers. No induration Neurologic: CN 2-12 grossly intact. Sensation intact, DTR normal. Strength 5/5 in all 4.  Psychiatric: Normal judgment and insight. Alert and oriented x 3. Normal mood.     Labs on Admission: I have personally reviewed following labs and imaging studies  CBC: Recent Labs  Lab 01/31/21 2235  WBC 9.4  NEUTROABS 6.6  HGB 19.0*  HCT 63.1*  MCV 85.0  PLT 167   Basic Metabolic Panel: Recent Labs  Lab 01/31/21 2235  NA 137  K 4.2  CL 99  CO2 28  GLUCOSE 223*  BUN 23*  CREATININE 1.36*  CALCIUM 8.7*   GFR: Estimated Creatinine Clearance: 73.1 mL/min (A) (by C-G formula based on SCr of 1.36 mg/dL (H)). Liver Function Tests: Recent Labs  Lab 01/31/21 2235  AST 22  ALT 12  ALKPHOS 58   BILITOT 0.7  PROT 7.0  ALBUMIN 3.1*   No results for input(s): LIPASE, AMYLASE in the last 168 hours. No results for input(s): AMMONIA in the last 168 hours. Coagulation Profile: No results for input(s): INR, PROTIME in the last 168 hours. Cardiac Enzymes: No results for input(s): CKTOTAL, CKMB, CKMBINDEX, TROPONINI in the last 168 hours. BNP (last 3 results) No results for input(s): PROBNP in the last 8760 hours. HbA1C: No results for input(s): HGBA1C in the last 72 hours. CBG: No results for input(s): GLUCAP in the last 168 hours. Lipid Profile: No results for input(s): CHOL, HDL, LDLCALC, TRIG, CHOLHDL, LDLDIRECT in the last 72 hours. Thyroid Function Tests: No results for input(s): TSH, T4TOTAL, FREET4, T3FREE, THYROIDAB in the last 72 hours. Anemia Panel: No results for input(s): VITAMINB12, FOLATE, FERRITIN, TIBC, IRON, RETICCTPCT in the last 72 hours. Urine analysis:    Component Value Date/Time   COLORURINE YELLOW 12/14/2019 0430   APPEARANCEUR CLEAR 12/14/2019 0430   LABSPEC 1.029 12/14/2019 0430   PHURINE 5.0 12/14/2019 0430   GLUCOSEU NEGATIVE 12/14/2019 0430   HGBUR NEGATIVE 12/14/2019 0430   BILIRUBINUR SMALL (A) 12/14/2019 0430   KETONESUR 5 (A) 12/14/2019 0430   PROTEINUR 30 (A) 12/14/2019 0430   NITRITE NEGATIVE 12/14/2019 0430   LEUKOCYTESUR TRACE (A) 12/14/2019 0430   Sepsis Labs: Recent Results (from the past 240 hour(s))  Resp Panel by RT-PCR (Flu A&B, Covid) Nasopharyngeal Swab     Status: None   Collection Time: 02/01/21 12:29 AM   Specimen: Nasopharyngeal Swab; Nasopharyngeal(NP) swabs in vial transport medium  Result Value Ref Range Status   SARS Coronavirus 2 by RT PCR NEGATIVE NEGATIVE Final    Comment: (NOTE) SARS-CoV-2 target nucleic acids are NOT DETECTED.  The SARS-CoV-2 RNA is generally detectable in upper respiratory specimens during the acute phase of infection. The lowest concentration of SARS-CoV-2 viral copies this assay can  detect is 138 copies/mL. A negative result does not preclude SARS-Cov-2 infection and should not be used as the sole basis for treatment or other patient management decisions. A negative result may occur with  improper specimen collection/handling, submission of specimen other than nasopharyngeal swab, presence of viral mutation(s) within the areas targeted by this assay, and inadequate number of viral copies(<138 copies/mL). A negative result must be combined with clinical observations, patient history, and epidemiological information. The expected result is Negative.  Fact Sheet for Patients:  BloggerCourse.com  Fact Sheet for Healthcare Providers:  SeriousBroker.it  This test is no t yet approved or cleared by the [place] FDA and  has been authorized for detection and/or diagnosis of SARS-CoV-2 by FDA under an Emergency Use Authorization (EUA). This EUA will remain  in effect (meaning this test can be used) for the duration of the COVID-19 declaration under Section 564(b)(1) of the Act, 21 U.S.C.section 360bbb-3(b)(1), unless the authorization is terminated  or revoked sooner.       Influenza A by PCR NEGATIVE NEGATIVE Final   Influenza B by PCR NEGATIVE NEGATIVE Final    Comment: (NOTE) The Xpert Xpress SARS-CoV-2/FLU/RSV plus assay is intended as an aid in the diagnosis of influenza from Nasopharyngeal swab specimens and should not be used as a sole basis for treatment. Nasal washings and aspirates are unacceptable for Xpert Xpress SARS-CoV-2/FLU/RSV testing.  Fact Sheet for Patients: BloggerCourse.com  Fact Sheet for Healthcare Providers: SeriousBroker.it  This test is not yet approved or cleared by the [place] FDA and has been authorized for detection and/or diagnosis of SARS-CoV-2 by FDA under an Emergency Use Authorization (EUA). This EUA will remain in  effect (meaning this test can be used) for the duration of the COVID-19 declaration under Section 564(b)(1) of the Act, 21 U.S.C. section 360bbb-3(b)(1), unless the authorization is terminated or revoked.  Performed at [facility], [address], [place], [place] [address]      Radiological Exams on Admission: DG Chest Port 1 View  Result Date: 01/31/2021 CLINICAL DATA:  Hypoxia. EXAM: PORTABLE CHEST 1 VIEW COMPARISON:  December 19, 2019 FINDINGS: There is stable tracheostomy tube positioning. Mildly increased interstitial lung markings are seen within the mid and lower lung fields. Mild linear atelectasis is noted within the mid right lung. Very small bilateral pleural effusions are seen. No pneumothorax is identified. The cardiac silhouette is mildly enlarged and unchanged in size. The visualized skeletal structures are unremarkable. IMPRESSION: 1. Increased interstitial lung markings within the mid and lower lung fields which is suspicious for mild infiltrates with a mild amount of superimposed interstitial edema. 2. Very small bilateral pleural effusions. Electronically Signed   By: Aram Candela M.D.   On: 01/31/2021 23:32    EKG: Independently reviewed.  Sinus rhythm at 79 bpm.  Assessment/Plan Respiratory failure with hypoxia secondary to diastolic congestive heart failure exacerbation: Acute on chronic.  Patient presents from skilled nursing facility due to inappropriate behavior with staff.  Noted to be short of breath with reports of having a cough with productive greenish sputum.  Chest x-ray concerning for pleural infiltrates with mild amount of superimposed interstitial edema and very small bilateral pleural effusions.  Last echocardiogram revealed EF of 55 to 60% with grade 2 diastolic dysfunction back in 12/14/2019.  No significant wheezing appreciated on physical exam at this time.  Patient has been given furosemide 40 mg IV x1 dose. -Admit to a telemetry bed -Heart  failure orders set  initiated  -Continuous pulse oximetry with nasal cannula oxygen as needed to keep O2 saturations >92% -Strict I&Os and daily weights -Elevate lower extremities -Lasix 40 mg IV Bid x2 doses, then reassess in a.m. and determine if further IV diuresis is warranted -Check echocardiogram -Consider need to formally consult cardiology in a.m. if warranted  COPD with acute bronchitis Possible pneumonia:  Acute.  Patient reports having cough with greenish sputum production.  Denies any significant fevers  White blood cell count within normal limits.  Patient appeared to have  been started on Augmentin at the nursing facility on 5/2 to complete a 7-day course as well as prednisone 20 mg twice daily.  Patient currently without signs of wheezing on physical exam. -Check procalcitonin -Pulmonary toiletry  -Continue Augmentin and prednisone -Continue DuoNebs every 4 hours as needed for shortness of breath/wheezing -Continue budesonide and hypertonic saline nebs -Continue Mucinex  Polycythemia: Acute. Hemoglobin 19 g/dL on admission and repeat check 18.8g/dL.  Question if this is secondary to patient's chronic respiratory failure. -Check peripheral smear and EPO level  Renal insufficiency: Acute.  Patient baseline creatinine appears to range from 0.9-1.1.  He presents with creatinine of 1.36 with BUN 23.  Increased not greater than 0.3 from patient's baseline to signify acute kidney injury. -Continue to monitor kidney function   History of TBI s/p tracheostomy -Tracheostomy care every shift and suctioning as needed  Paroxysmal atrial fibrillation: Patient appears to be in sinus rhythm. CHA2DS2-VASc score = least 4. Not on anticoagulation due to history of GI bleed.  -Continue aspirin, digoxin, and metoprolol as tolerated  History of bipolar disorder schizophrenia: Patient was transferred to [facility] on for need of psychiatric evaluation after he reportedly had been harassing staff  members there. -Continue Seroquel, Klonopin, and fluoxetine -Inpatient psychiatry consult  Diabetes mellitus type 2: Home medication regimen includes Lantus 20 units nightly.  Blood sugars have been well controlled here in the emergency department. -Hypoglycemic protocols -CBGs before every meal with sensitive SSI  Obesity: BMI 30.47 kg/m  DVT prophylaxis: Lovenox Code Status: Full Family Communication: None requested by the patient Disposition Plan: To be determined Consults called: Psychiatry Admission status: Observation  Clydie Braun MD [facility]   If 7PM-7AM, please contact night-coverage   02/01/2021, 10:20 AM

## 2021-02-02 DIAGNOSIS — J9621 Acute and chronic respiratory failure with hypoxia: Secondary | ICD-10-CM

## 2021-02-02 DIAGNOSIS — E669 Obesity, unspecified: Secondary | ICD-10-CM | POA: Diagnosis present

## 2021-02-02 DIAGNOSIS — E785 Hyperlipidemia, unspecified: Secondary | ICD-10-CM | POA: Diagnosis present

## 2021-02-02 DIAGNOSIS — Z794 Long term (current) use of insulin: Secondary | ICD-10-CM | POA: Diagnosis not present

## 2021-02-02 DIAGNOSIS — K219 Gastro-esophageal reflux disease without esophagitis: Secondary | ICD-10-CM | POA: Diagnosis present

## 2021-02-02 DIAGNOSIS — E119 Type 2 diabetes mellitus without complications: Secondary | ICD-10-CM | POA: Diagnosis present

## 2021-02-02 DIAGNOSIS — D751 Secondary polycythemia: Secondary | ICD-10-CM | POA: Diagnosis present

## 2021-02-02 DIAGNOSIS — F209 Schizophrenia, unspecified: Secondary | ICD-10-CM | POA: Diagnosis present

## 2021-02-02 DIAGNOSIS — Z66 Do not resuscitate: Secondary | ICD-10-CM | POA: Diagnosis present

## 2021-02-02 DIAGNOSIS — F17210 Nicotine dependence, cigarettes, uncomplicated: Secondary | ICD-10-CM | POA: Diagnosis present

## 2021-02-02 DIAGNOSIS — I5033 Acute on chronic diastolic (congestive) heart failure: Secondary | ICD-10-CM | POA: Diagnosis present

## 2021-02-02 DIAGNOSIS — Z6830 Body mass index (BMI) 30.0-30.9, adult: Secondary | ICD-10-CM | POA: Diagnosis not present

## 2021-02-02 DIAGNOSIS — N289 Disorder of kidney and ureter, unspecified: Secondary | ICD-10-CM | POA: Diagnosis present

## 2021-02-02 DIAGNOSIS — I480 Paroxysmal atrial fibrillation: Secondary | ICD-10-CM | POA: Diagnosis present

## 2021-02-02 DIAGNOSIS — Z20822 Contact with and (suspected) exposure to covid-19: Secondary | ICD-10-CM | POA: Diagnosis present

## 2021-02-02 DIAGNOSIS — J440 Chronic obstructive pulmonary disease with acute lower respiratory infection: Secondary | ICD-10-CM | POA: Diagnosis present

## 2021-02-02 DIAGNOSIS — Z930 Tracheostomy status: Secondary | ICD-10-CM | POA: Diagnosis not present

## 2021-02-02 DIAGNOSIS — Z7951 Long term (current) use of inhaled steroids: Secondary | ICD-10-CM | POA: Diagnosis not present

## 2021-02-02 DIAGNOSIS — Z87820 Personal history of traumatic brain injury: Secondary | ICD-10-CM | POA: Diagnosis not present

## 2021-02-02 DIAGNOSIS — J209 Acute bronchitis, unspecified: Secondary | ICD-10-CM | POA: Diagnosis present

## 2021-02-02 DIAGNOSIS — R0902 Hypoxemia: Secondary | ICD-10-CM | POA: Diagnosis present

## 2021-02-02 DIAGNOSIS — I110 Hypertensive heart disease with heart failure: Secondary | ICD-10-CM | POA: Diagnosis present

## 2021-02-02 DIAGNOSIS — Z79899 Other long term (current) drug therapy: Secondary | ICD-10-CM | POA: Diagnosis not present

## 2021-02-02 DIAGNOSIS — R4587 Impulsiveness: Secondary | ICD-10-CM | POA: Diagnosis present

## 2021-02-02 DIAGNOSIS — F3130 Bipolar disorder, current episode depressed, mild or moderate severity, unspecified: Secondary | ICD-10-CM | POA: Diagnosis present

## 2021-02-02 LAB — BASIC METABOLIC PANEL
Anion gap: 9 (ref 5–15)
BUN: 20 mg/dL (ref 6–20)
CO2: 31 mmol/L (ref 22–32)
Calcium: 8.3 mg/dL — ABNORMAL LOW (ref 8.9–10.3)
Chloride: 97 mmol/L — ABNORMAL LOW (ref 98–111)
Creatinine, Ser: 1.16 mg/dL (ref 0.61–1.24)
GFR, Estimated: >60 mL/min (ref >60)
Glucose, Bld: 130 mg/dL — ABNORMAL HIGH (ref 70–99)
Potassium: 4 mmol/L (ref 3.5–5.1)
Sodium: 137 mmol/L (ref 135–145)

## 2021-02-02 LAB — GLUCOSE, CAPILLARY
Glucose-Capillary: 111 mg/dL — ABNORMAL HIGH (ref 70–99)
Glucose-Capillary: 180 mg/dL — ABNORMAL HIGH (ref 70–99)
Glucose-Capillary: 248 mg/dL — ABNORMAL HIGH (ref 70–99)
Glucose-Capillary: 139 mg/dL — ABNORMAL HIGH (ref 70–99)
Glucose-Capillary: 192 mg/dL — ABNORMAL HIGH (ref 70–99)

## 2021-02-02 LAB — CBC
HCT: 64.2 % — ABNORMAL HIGH (ref 39.0–52.0)
Hemoglobin: 19 g/dL — ABNORMAL HIGH (ref 13.0–17.0)
MCH: 25 pg — ABNORMAL LOW (ref 26.0–34.0)
MCHC: 29.6 g/dL — ABNORMAL LOW (ref 30.0–36.0)
MCV: 84.6 fL (ref 80.0–100.0)
Platelets: 176 10*3/uL (ref 150–400)
RBC: 7.59 MIL/uL — ABNORMAL HIGH (ref 4.22–5.81)
RDW: 21.2 % — ABNORMAL HIGH (ref 11.5–15.5)
WBC: 8.9 10*3/uL (ref 4.0–10.5)
nRBC: 0 % (ref 0.0–0.2)

## 2021-02-02 LAB — ERYTHROPOIETIN: Erythropoietin: 42.5 m[IU]/mL — ABNORMAL HIGH (ref 2.6–18.5)

## 2021-02-02 MED ORDER — PREDNISONE 20 MG PO TABS
40.0000 mg | ORAL_TABLET | Freq: Every day | ORAL | Status: DC
  Administered 2021-02-03 – 2021-02-07 (×5): 40 mg via ORAL
  Filled 2021-02-02 (×5): qty 2

## 2021-02-02 NOTE — Progress Notes (Signed)
PROGRESS NOTE    Tommy Mathews  MBT:[id number] DOB: [birth year] DOA: 01/31/2021 PCP: Georgann Housekeeper, MD     Brief Narrative:  Tommy Mathews is a 60 y.o. male with medical history significant of HTN, DM type II, systolic CHF, paroxysmal atrial fibrillation not on anticoagulation 2/2 history of GI bleeds, COPD, TBI s/p tracheostomy, bipolar disorder, schizophrenia, and GERD presenting after patient reported to have been harassing staff member and the physician in charge recommended ER visit for psychiatric evaluation.  Patient only notes that he had gotten some kind of disagreement with one of the staff members there.  He states that over the last couple days he has had productive cough with greenish sputum production and has been feeling short of breath.  He denies having any significant fever, nausea, vomiting, chest pain, change in vision, or diarrhea symptoms.  After talking to staff at [facility] it appears patient had been started on Augmentin 500-125 mg to complete a 7-day course as well as prednisone.  In the emergency department, patient was noted to have oxygen saturation as low as 77%, with improvement after suctioning and his chronic trach with increase in supplemental oxygen use.  Chest x-ray with suspicious for mild infiltrates as well as superimposed interstitial edema.  He was given IV Lasix, continued on Augmentin and breathing treatments.  Psychiatry consulted.  New events last 24 hours / Subjective: Patient denies any sort of outburst at [facility].  He states that he has had a productive cough with green sputum over the last week but denies any shortness of breath.  Denies any fevers, chest pain, nausea, vomiting.  Assessment & Plan:   Principal Problem:   Acute on chronic respiratory failure with hypoxia (HCC) Active Problems:   Schizophrenia (HCC)   Bipolar I disorder, most recent episode depressed (HCC)   Acute on chronic diastolic CHF (congestive heart failure) (HCC)   COPD  with acute bronchitis (HCC)   Polycythemia   Renal insufficiency   Acute on chronic hypoxemic respiratory failure with chronic trach -Secondary to acute bronchitis -Chest x-ray showed interstitial lung markings with mild infiltrates with mild amount of superimposed interstitial edema -BNP 164.1 -Procalcitonin negative -Patient was initially given 1 dose of IV Lasix.  Echocardiogram did not reveal CHF -Continue Augmentin, breathing treatments, steroids  Polycythemia -Peripheral smear, EPO pending  History of TBI status post tracheostomy -Trach care and suctioning as needed  Paroxysmal atrial fibrillation -CHA2DS2-VASc score 4, not on anticoagulation due to history of GI bleeding -Continue aspirin, digoxin, metoprolol -In normal sinus rhythm this morning  HTN -Continue lisinopril, Toprol  History of bipolar disorder, schizophrenia -Continue Seroquel, Klonopin, Depakote, Prozac -Psychiatry consulted, recommended for outpatient referral for cognitive behavioral therapy.  No recommendations to adjust his medications at this point.  Does not need inpatient psych admission.  Diabetes mellitus type 2 -Hemoglobin A1c 7.0 -Continue sliding scale insulin  DVT prophylaxis:  enoxaparin (LOVENOX) injection 40 mg Start: 02/01/21 2200  Code Status:     Code Status Orders  (From admission, onward)         Start     Ordered   02/01/21 1039  Full code  Continuous        02/01/21 1041        Code Status History    Date Active Date Inactive Code Status Order ID Comments User Context   12/14/2019 0741 12/20/2019 1918 Full Code [id number]  Briscoe Deutscher, MD ED   08/24/2019 0928 09/02/2019 0120 Full Code [id number]  Levora Dredge, MD ED   02/11/2018 1402 03/19/2018 2055 DNR [id number]  Merwyn Katos, MD Inpatient   01/06/2018 2227 02/11/2018 1402 Full Code [id number]  Oralia Manis, MD ED   Advance Care Planning Activity     Family Communication: None at bedside Disposition Plan:   Status is: Observation  The patient will require care spanning > 2 midnights and should be moved to inpatient because: Hemodynamically unstable  Dispo: The patient is from: SNF              Anticipated d/c is to: SNF              Patient currently is not medically stable to d/c. Wean O2 as able. Currently on 10L    Difficult to place patient No      Consultants:   None  Procedures:   None   Antimicrobials:  Anti-infectives (From admission, onward)   Start     Dose/Rate Route Frequency Ordered Stop   02/01/21 1400  amoxicillin-clavulanate (AUGMENTIN) 500-125 MG per tablet 500 mg        1 tablet Oral Every 8 hours 02/01/21 1240          Objective: Vitals:   02/02/21 0914 02/02/21 0922 02/02/21 1139 02/02/21 1254  BP:  (!) 136/59  (!) 149/59  Pulse: 67 66 65 66  Resp: 20 20 17 17   Temp:  98.3 F (36.8 C)    TempSrc:  Oral    SpO2: 93% 96% 93% 94%  Weight:      Height:        Intake/Output Summary (Last 24 hours) at 02/02/2021 1315 Last data filed at 02/02/2021 1200 Gross per 24 hour  Intake 480 ml  Output 1225 ml  Net -745 ml   Filed Weights   02/01/21 0535 02/02/21 0707  Weight: 101.9 kg 101.9 kg    Examination: General exam: Appears calm and comfortable  Respiratory system: + Bilateral wheezes. Respiratory effort normal. No respiratory distress. No conversational dyspnea.  Trach collar in place Cardiovascular system: S1 & S2 heard, RRR. No murmurs. No pedal edema. Gastrointestinal system: Abdomen is nondistended, soft and nontender. Normal bowel sounds heard. Central nervous system: Alert and oriented. No focal neurological deficits. Speech clear.  Extremities: Symmetric in appearance  Skin: No rashes, lesions or ulcers on exposed skin  Psychiatry: Judgement and insight appear normal. Mood & affect appropriate.   Data Reviewed: I have personally reviewed following labs and imaging studies  CBC: Recent Labs  Lab 01/31/21 2235 02/01/21 1117  02/02/21 0113  WBC 9.4 10.2 8.9  NEUTROABS 6.6 7.0  --   HGB 19.0* 18.8* 19.0*  HCT 63.1* 64.0* 64.2*  MCV 85.0 85.1 84.6  PLT 167 169 176   Basic Metabolic Panel: Recent Labs  Lab 01/31/21 2235 02/01/21 1117 02/02/21 0113  NA 137 137 137  K 4.2 4.8 4.0  CL 99 99 97*  CO2 28 32 31  GLUCOSE 223* 110* 130*  BUN 23* 17 20  CREATININE 1.36* 1.11 1.16  CALCIUM 8.7* 8.5* 8.3*   GFR: Estimated Creatinine Clearance: 85.7 mL/min (by C-G formula based on SCr of 1.16 mg/dL). Liver Function Tests: Recent Labs  Lab 01/31/21 2235  AST 22  ALT 12  ALKPHOS 58  BILITOT 0.7  PROT 7.0  ALBUMIN 3.1*   No results for input(s): LIPASE, AMYLASE in the last 168 hours. No results for input(s): AMMONIA in the last 168 hours. Coagulation Profile: No results for input(s): INR, PROTIME in  the last 168 hours. Cardiac Enzymes: No results for input(s): CKTOTAL, CKMB, CKMBINDEX, TROPONINI in the last 168 hours. BNP (last 3 results) No results for input(s): PROBNP in the last 8760 hours. HbA1C: Recent Labs    02/01/21 1247  HGBA1C 7.0*   CBG: Recent Labs  Lab 02/01/21 1623 02/01/21 2111 02/02/21 0618 02/02/21 1248 02/02/21 1308  GLUCAP 183* 177* 111* 180* 248*   Lipid Profile: No results for input(s): CHOL, HDL, LDLCALC, TRIG, CHOLHDL, LDLDIRECT in the last 72 hours. Thyroid Function Tests: No results for input(s): TSH, T4TOTAL, FREET4, T3FREE, THYROIDAB in the last 72 hours. Anemia Panel: No results for input(s): VITAMINB12, FOLATE, FERRITIN, TIBC, IRON, RETICCTPCT in the last 72 hours. Sepsis Labs: Recent Labs  Lab 02/01/21 1247  PROCALCITON <0.10    Recent Results (from the past 240 hour(s))  Resp Panel by RT-PCR (Flu A&B, Covid) Nasopharyngeal Swab     Status: None   Collection Time: 02/01/21 12:29 AM   Specimen: Nasopharyngeal Swab; Nasopharyngeal(NP) swabs in vial transport medium  Result Value Ref Range Status   SARS Coronavirus 2 by RT PCR NEGATIVE NEGATIVE Final     Comment: (NOTE) SARS-CoV-2 target nucleic acids are NOT DETECTED.  The SARS-CoV-2 RNA is generally detectable in upper respiratory specimens during the acute phase of infection. The lowest concentration of SARS-CoV-2 viral copies this assay can detect is 138 copies/mL. A negative result does not preclude SARS-Cov-2 infection and should not be used as the sole basis for treatment or other patient management decisions. A negative result may occur with  improper specimen collection/handling, submission of specimen other than nasopharyngeal swab, presence of viral mutation(s) within the areas targeted by this assay, and inadequate number of viral copies(<138 copies/mL). A negative result must be combined with clinical observations, patient history, and epidemiological information. The expected result is Negative.  Fact Sheet for Patients:  BloggerCourse.comhttps://www.fda.gov/media/152166/download  Fact Sheet for Healthcare Providers:  SeriousBroker.ithttps://www.fda.gov/media/152162/download  This test is no t yet approved or cleared by the [place] FDA and  has been authorized for detection and/or diagnosis of SARS-CoV-2 by FDA under an Emergency Use Authorization (EUA). This EUA will remain  in effect (meaning this test can be used) for the duration of the COVID-19 declaration under Section 564(b)(1) of the Act, 21 U.S.C.section 360bbb-3(b)(1), unless the authorization is terminated  or revoked sooner.       Influenza A by PCR NEGATIVE NEGATIVE Final   Influenza B by PCR NEGATIVE NEGATIVE Final    Comment: (NOTE) The Xpert Xpress SARS-CoV-2/FLU/RSV plus assay is intended as an aid in the diagnosis of influenza from Nasopharyngeal swab specimens and should not be used as a sole basis for treatment. Nasal washings and aspirates are unacceptable for Xpert Xpress SARS-CoV-2/FLU/RSV testing.  Fact Sheet for Patients: BloggerCourse.comhttps://www.fda.gov/media/152166/download  Fact Sheet for Healthcare  Providers: SeriousBroker.ithttps://www.fda.gov/media/152162/download  This test is not yet approved or cleared by the [place] FDA and has been authorized for detection and/or diagnosis of SARS-CoV-2 by FDA under an Emergency Use Authorization (EUA). This EUA will remain in effect (meaning this test can be used) for the duration of the COVID-19 declaration under Section 564(b)(1) of the Act, 21 U.S.C. section 360bbb-3(b)(1), unless the authorization is terminated or revoked.  Performed at [facility], [address] [place], [place] [address]       Radiology Studies: DG Chest Port 1 View  Result Date: 01/31/2021 CLINICAL DATA:  Hypoxia. EXAM: PORTABLE CHEST 1 VIEW COMPARISON:  December 19, 2019 FINDINGS: There  is stable tracheostomy tube positioning. Mildly increased interstitial lung markings are seen within the mid and lower lung fields. Mild linear atelectasis is noted within the mid right lung. Very small bilateral pleural effusions are seen. No pneumothorax is identified. The cardiac silhouette is mildly enlarged and unchanged in size. The visualized skeletal structures are unremarkable. IMPRESSION: 1. Increased interstitial lung markings within the mid and lower lung fields which is suspicious for mild infiltrates with a mild amount of superimposed interstitial edema. 2. Very small bilateral pleural effusions. Electronically Signed   By: Aram Candela M.D.   On: 01/31/2021 23:32   ECHOCARDIOGRAM COMPLETE  Result Date: 02/01/2021    ECHOCARDIOGRAM REPORT   Patient Name:   MAE CIANCI Date of Exam: 02/01/2021 Medical Rec #:  [id number]     Height:       72.0 in Accession #:    [id number]    Weight:       224.7 lb Date of Birth:  [birth year]     BSA:          2.239 m Patient Age:    59 years      BP:           165/94 mmHg Patient Gender: M             HR:           56 bpm. Exam Location:  Inpatient Procedure: 2D Echo, Color Doppler and Cardiac Doppler Indications:    CHF-Acute Diastolic I50.31   History:        Patient has prior history of Echocardiogram examinations, most                 recent 12/14/2019. Risk Factors:Hypertension, Dyslipidemia and                 Diabetes.  Sonographer:    Eulah Pont RDCS Referring Phys: [id number] RONDELL A SMITH IMPRESSIONS  1. Left ventricular ejection fraction, by estimation, is 55%. The left ventricle has normal function. The left ventricle has no regional wall motion abnormalities. Left ventricular diastolic parameters were normal.  2. Right ventricular systolic function is normal. The right ventricular size is normal.  3. Left atrial size was mildly dilated.  4. The pericardial effusion is posterior to the left ventricle.  5. The mitral valve is normal in structure. No evidence of mitral valve regurgitation. No evidence of mitral stenosis.  6. The aortic valve is tricuspid. There is mild calcification of the aortic valve. Aortic valve regurgitation is not visualized. Mild aortic valve sclerosis is present, with no evidence of aortic valve stenosis.  7. The inferior vena cava is normal in size with greater than 50% respiratory variability, suggesting right atrial pressure of 3 mmHg. FINDINGS  Left Ventricle: Left ventricular ejection fraction, by estimation, is 55%. The left ventricle has normal function. The left ventricle has no regional wall motion abnormalities. The left ventricular internal cavity size was normal in size. There is no left ventricular hypertrophy. Left ventricular diastolic parameters were normal. Right Ventricle: The right ventricular size is normal. No increase in right ventricular wall thickness. Right ventricular systolic function is normal. Left Atrium: Left atrial size was mildly dilated. Right Atrium: Right atrial size was normal in size. Pericardium: Trivial pericardial effusion is present. The pericardial effusion is posterior to the left ventricle. Mitral Valve: The mitral valve is normal in structure. No evidence of mitral valve  regurgitation. No evidence of mitral valve stenosis. Tricuspid Valve: The tricuspid valve is  normal in structure. Tricuspid valve regurgitation is trivial. No evidence of tricuspid stenosis. Aortic Valve: The aortic valve is tricuspid. There is mild calcification of the aortic valve. Aortic valve regurgitation is not visualized. Mild aortic valve sclerosis is present, with no evidence of aortic valve stenosis. Pulmonic Valve: The pulmonic valve was normal in structure. Pulmonic valve regurgitation is trivial. No evidence of pulmonic stenosis. Aorta: The aortic root is normal in size and structure. Venous: The inferior vena cava is normal in size with greater than 50% respiratory variability, suggesting right atrial pressure of 3 mmHg. IAS/Shunts: No atrial level shunt detected by color flow Doppler.  LEFT VENTRICLE PLAX 2D LVIDd:         4.80 cm  Diastology LVIDs:         3.20 cm  LV e' medial:    8.38 cm/s LV PW:         0.90 cm  LV E/e' medial:  12.2 LV IVS:        0.90 cm  LV e' lateral:   7.49 cm/s LVOT diam:     1.90 cm  LV E/e' lateral: 13.6 LV SV:         78 LV SV Index:   35 LVOT Area:     2.84 cm  RIGHT VENTRICLE RV S prime:     10.50 cm/s TAPSE (M-mode): 2.2 cm LEFT ATRIUM             Index       RIGHT ATRIUM           Index LA diam:        3.80 cm 1.70 cm/m  RA Area:     15.80 cm LA Vol (A2C):   38.5 ml 17.20 ml/m RA Volume:   43.50 ml  19.43 ml/m LA Vol (A4C):   38.1 ml 17.02 ml/m LA Biplane Vol: 38.9 ml 17.38 ml/m  AORTIC VALVE LVOT Vmax:   122.00 cm/s LVOT Vmean:  79.600 cm/s LVOT VTI:    0.276 m  AORTA Ao Root diam: 2.80 cm Ao Asc diam:  3.10 cm MITRAL VALVE MV Area (PHT): 3.39 cm     SHUNTS MV Decel Time: 224 msec     Systemic VTI:  0.28 m MV E velocity: 102.00 cm/s  Systemic Diam: 1.90 cm MV A velocity: 68.10 cm/s MV E/A ratio:  1.50 Charlton Haws MD Electronically signed by Charlton Haws MD Signature Date/Time: 02/01/2021/4:56:50 PM    Final       Scheduled Meds: . amoxicillin-clavulanate   1 tablet Oral Q8H  . aspirin EC  81 mg Oral Daily  . budesonide  0.5 mg Nebulization BID  . clobetasol cream  1 application Topical BID  . clonazePAM  0.25 mg Oral BID  . digoxin  0.25 mg Oral Daily  . divalproex  500 mg Oral BID  . docusate sodium  100 mg Oral BID  . enoxaparin (LOVENOX) injection  40 mg Subcutaneous Q24H  . FLUoxetine  20 mg Oral Daily  . gabapentin  200 mg Oral Q12H  . guaiFENesin  600 mg Oral BID  . insulin aspart  0-9 Units Subcutaneous TID WC  . lisinopril  10 mg Oral Daily  . magnesium oxide  400 mg Oral BID  . metoprolol succinate  25 mg Oral Daily  . pantoprazole sodium  40 mg Oral Daily  . predniSONE  20 mg Oral BID WC  . QUEtiapine  200 mg Oral QHS  . saccharomyces boulardii  250 mg Oral BID  . sodium chloride flush  3 mL Intravenous Q12H  . sodium chloride HYPERTONIC  4 mL Nebulization Daily   Continuous Infusions: . sodium chloride       LOS: 0 days      Time spent: 25 minutes   Noralee Stain, DO [facility] 02/02/2021, 1:15 PM   Available via Epic secure chat 7am-7pm After these hours, please refer to coverage provider listed on [email or web address]

## 2021-02-02 NOTE — Consult Note (Addendum)
[facility] Face-to-Face Psychiatry Consult   Reason for Consult: Patient transferred from [facility] for harassing staff with request for psychiatry evaluation prior history of bipolar and schizophrenia  Referring Physician:  Dr. Madelyn Flavors  Patient Identification: Tommy Mathews MRN:  [id number] Principal Diagnosis: Acute on chronic respiratory failure with hypoxia [facility]) Diagnosis:  Principal Problem:   Acute on chronic respiratory failure with hypoxia (HCC) Active Problems:   Schizophrenia (HCC)   Bipolar I disorder, most recent episode depressed (HCC)   Acute on chronic diastolic CHF (congestive heart failure) (HCC)   COPD with acute bronchitis (HCC)   Polycythemia   Renal insufficiency   Total Time spent with patient: 30 minutes  Subjective:   Tommy Mathews is a 60 y.o. male patient admitted with shortness of breath. Patient with history of schizophrenia and Bipolar disorder that is managed by Seroquel, Depkaote and Klonopin. Patient states he has been on these medications for quite some time. He denies any current psychosis, paranoia, delusions or hallucinations. He denies any impulsivity, mania, pressured speech, grandiosity, or mood lability. He does admit to "asking one of the staff for a kiss, but could sense the discomfort and left it at that. "  He denies any type of sexual advances, sexual or implicit comments, or inappropriate touching of others. He denies any suicidal ideations, homicidal ideations. He denies any criminal charges, substance abuse or history of sexual abuse. At this time it is felt that patient is psychiatrically cleared, to discharge home with outpatient follow-up.   Patient is seen and assessed by this nurse practitioner, he is alert and oriented, calm and cooperative, and engaged well with Clinical research associate. He remains appropriate throughout the evaluation. He does admit to asking a staff member for a kiss, and why that was inappropriate. He does seem to have improved insight  and judgement, and expressed ways to control his impulsivity. He does not appear to be experiencing any psychiatric conditions that would warrant medication adjustments or inpatient admission. He does not appear to exhibiting any delusional thinking disorders, psychosis, paranoia, or hallucinations. He denies any intent to harm himself or others at this time. He answers all questions appropriately.  HPI:  Tommy Mathews is a 60 y.o. male with medical history significant of HTN, DM type II, systolic CHF, paroxysmal atrial fibrillation not on anticoagulation  2/2 history of GI bleeds, COPD, TBI s/p tracheostomy, bipolar disorder, schizophrenia, and GERD presenting after patient reported to have been harassing staff member had physician in charge recommended ER visit for psychiatric evaluation.  Patient only notes that he had gotten some kind of disagreement with one of the staff members there.  He states that over the last couple days he has had productive cough with greenish sputum production and has been feeling short of breath.  He denies having any significant fever, nausea, vomiting, chest pain, change in vision, or diarrhea symptoms.  After talking to staff at [facility] it appears patient had been started on Augmentin 500-125 mg to complete a 7-day course as well as prednisone.  Past Psychiatric History: Schizophrenia.   Risk to Self:  No  Risk to Others:   No  Prior Inpatient Therapy:   Multiple inpatient admissions, last admission to [facility] 2019 Prior Outpatient Therapy:   He sees phsyician at the facility,. NO current outpatient psychiatric services.   Past Medical History:  Past Medical History:  Diagnosis Date  . Diabetes (HCC)   . GERD (gastroesophageal reflux disease)   . HLD (hyperlipidemia)   .  HTN (hypertension)     Past Surgical History:  Procedure Laterality Date  . IR GASTROSTOMY TUBE REMOVAL  08/15/2018  . PEG PLACEMENT N/A 01/21/2018   Procedure: PERCUTANEOUS ENDOSCOPIC  GASTROSTOMY (PEG) PLACEMENT;  Surgeon: Wyline Mood, MD;  Location: [facility] ENDOSCOPY;  Service: Gastroenterology;  Laterality: N/A;  . TRACHEOSTOMY TUBE PLACEMENT N/A 01/23/2018   Procedure: TRACHEOSTOMY;  Surgeon: Bud Face, MD;  Location: [facility] ORS;  Service: ENT;  Laterality: N/A;   Family History: No family history on file. Family Psychiatric  History: Denies Social History:  Social History   Substance and Sexual Activity  Alcohol Use Never     Social History   Substance and Sexual Activity  Drug Use Never    Social History   Socioeconomic History  . Marital status: Single    Spouse name: Not on file  . Number of children: Not on file  . Years of education: Not on file  . Highest education level: Not on file  Occupational History  . Not on file  Tobacco Use  . Smoking status: Current Every Day Smoker    Packs/day: 1.00    Years: 40.00    Pack years: 40.00    Types: Cigarettes    Start date: 2000  . Smokeless tobacco: Never Used  Vaping Use  . Vaping Use: Never used  Substance and Sexual Activity  . Alcohol use: Never  . Drug use: Never  . Sexual activity: Never  Other Topics Concern  . Not on file  Social History Narrative   Patient's sister states patient needs assistance with life management (bills, taking care of house,etc)the patient is wheelchair bound but can stand and walk around house short distances. Pt lived in a group home setting prior to hospital. Pt's mother is next of kin. Tammy Sours rn   Social Determinants of Health   Financial Resource Strain: Not on file  Food Insecurity: Not on file  Transportation Needs: Not on file  Physical Activity: Not on file  Stress: Not on file  Social Connections: Not on file   Additional Social History:    Allergies:  No Known Allergies  Labs:  Results for orders placed or performed during the hospital encounter of 01/31/21 (from the past 48 hour(s))  CBC with Differential     Status: Abnormal    Collection Time: 01/31/21 10:35 PM  Result Value Ref Range   WBC 9.4 4.0 - 10.5 K/uL   RBC 7.42 (H) 4.22 - 5.81 MIL/uL   Hemoglobin 19.0 (H) 13.0 - 17.0 g/dL   HCT 16.1 (H) 09.6 - 04.5 %   MCV 85.0 80.0 - 100.0 fL   MCH 25.6 (L) 26.0 - 34.0 pg   MCHC 30.1 30.0 - 36.0 g/dL   RDW 40.9 (H) 81.1 - 91.4 %   Platelets 167 150 - 400 K/uL    Comment: REPEATED TO VERIFY   nRBC 0.6 (H) 0.0 - 0.2 %   Neutrophils Relative % 70 %   Neutro Abs 6.6 1.7 - 7.7 K/uL   Lymphocytes Relative 19 %   Lymphs Abs 1.8 0.7 - 4.0 K/uL   Monocytes Relative 9 %   Monocytes Absolute 0.8 0.1 - 1.0 K/uL   Eosinophils Relative 0 %   Eosinophils Absolute 0.0 0.0 - 0.5 K/uL   Basophils Relative 0 %   Basophils Absolute 0.0 0.0 - 0.1 K/uL   Immature Granulocytes 2 %   Abs Immature Granulocytes 0.14 (H) 0.00 - 0.07 K/uL    Comment: Performed  at [facility] Lab, [address], [place], [place] [address]  Comprehensive metabolic panel     Status: Abnormal   Collection Time: 01/31/21 10:35 PM  Result Value Ref Range   Sodium 137 135 - 145 mmol/L   Potassium 4.2 3.5 - 5.1 mmol/L   Chloride 99 98 - 111 mmol/L   CO2 28 22 - 32 mmol/L   Glucose, Bld 223 (H) 70 - 99 mg/dL   BUN 23 (H) 6 - 20 mg/dL   Creatinine, Ser 9.38 (H) 0.61 - 1.24 mg/dL   Calcium 8.7 (L) 8.9 - 10.3 mg/dL   Total Protein 7.0 6.5 - 8.1 g/dL   Albumin 3.1 (L) 3.5 - 5.0 g/dL   AST 22 15 - 41 U/L   ALT 12 0 - 44 U/L   Alkaline Phosphatase 58 38 - 126 U/L   Total Bilirubin 0.7 0.3 - 1.2 mg/dL   GFR, Estimated >18 >29 mL/min    Comment: (NOTE) Calculated using the CKD-EPI Creatinine Equation (2021)    Anion gap 10 5 - 15    Comment: Performed at [facility] Lab, [address], [place], [place] [address]  Acetaminophen level     Status: Abnormal   Collection Time: 01/31/21 10:35 PM  Result Value Ref Range   Acetaminophen (Tylenol), Serum <10 (L) 10 - 30 ug/mL    Comment: (NOTE) Therapeutic concentrations vary significantly. A range of  10-30 ug/mL  may be an effective concentration for many patients. However, some  are best treated at concentrations outside of this range. Acetaminophen concentrations >150 ug/mL at 4 hours after ingestion  and >50 ug/mL at 12 hours after ingestion are often associated with  toxic reactions.  Performed at [facility] Lab, [address] [place], [place] [address]   Salicylate level     Status: Abnormal   Collection Time: 01/31/21 10:35 PM  Result Value Ref Range   Salicylate Lvl <7.0 (L) 7.0 - 30.0 mg/dL    Comment: Performed at [facility] Lab, [address] [place], [place] [address]  Ethanol     Status: None   Collection Time: 01/31/21 10:35 PM  Result Value Ref Range   Alcohol, Ethyl (B) <10 <10 mg/dL    Comment: (NOTE) Lowest detectable limit for serum alcohol is 10 mg/dL.  For medical purposes only. Performed at [facility], [address] [place], [place] [address]   Rapid urine drug screen (hospital performed)     Status: None   Collection Time: 01/31/21 10:43 PM  Result Value Ref Range   Opiates NONE DETECTED NONE DETECTED   Cocaine NONE DETECTED NONE DETECTED   Benzodiazepines NONE DETECTED NONE DETECTED   Amphetamines NONE DETECTED NONE DETECTED   Tetrahydrocannabinol NONE DETECTED NONE DETECTED   Barbiturates NONE DETECTED NONE DETECTED    Comment: (NOTE) DRUG SCREEN FOR MEDICAL PURPOSES ONLY.  IF CONFIRMATION IS NEEDED FOR ANY PURPOSE, NOTIFY LAB WITHIN 5 DAYS.  LOWEST DETECTABLE LIMITS FOR URINE DRUG SCREEN Drug Class                     Cutoff (ng/mL) Amphetamine and metabolites    1000 Barbiturate and metabolites    200 Benzodiazepine                 200 Tricyclics and metabolites     300 Opiates and metabolites        300 Cocaine and metabolites  300 THC                            50 Performed at [facility] Lab, [address], [place], [place] [address]   Digoxin level     Status: Abnormal   Collection Time: 01/31/21  11:18 PM  Result Value Ref Range   Digoxin Level 0.7 (L) 0.8 - 2.0 ng/mL    Comment: Performed at [facility] Lab, [address] [place], [place] [address]  Brain natriuretic peptide     Status: Abnormal   Collection Time: 01/31/21 11:18 PM  Result Value Ref Range   B Natriuretic Peptide 164.1 (H) 0.0 - 100.0 pg/mL    Comment: Performed at [facility] Lab, [address] [place], [place] [address]  Resp Panel by RT-PCR (Flu A&B, Covid) Nasopharyngeal Swab     Status: None   Collection Time: 02/01/21 12:29 AM   Specimen: Nasopharyngeal Swab; Nasopharyngeal(NP) swabs in vial transport medium  Result Value Ref Range   SARS Coronavirus 2 by RT PCR NEGATIVE NEGATIVE    Comment: (NOTE) SARS-CoV-2 target nucleic acids are NOT DETECTED.  The SARS-CoV-2 RNA is generally detectable in upper respiratory specimens during the acute phase of infection. The lowest concentration of SARS-CoV-2 viral copies this assay can detect is 138 copies/mL. A negative result does not preclude SARS-Cov-2 infection and should not be used as the sole basis for treatment or other patient management decisions. A negative result may occur with  improper specimen collection/handling, submission of specimen other than nasopharyngeal swab, presence of viral mutation(s) within the areas targeted by this assay, and inadequate number of viral copies(<138 copies/mL). A negative result must be combined with clinical observations, patient history, and epidemiological information. The expected result is Negative.  Fact Sheet for Patients:  BloggerCourse.com  Fact Sheet for Healthcare Providers:  SeriousBroker.it  This test is no t yet approved or cleared by the [place] FDA and  has been authorized for detection and/or diagnosis of SARS-CoV-2 by FDA under an Emergency Use Authorization (EUA). This EUA will remain  in effect (meaning this test can be used) for  the duration of the COVID-19 declaration under Section 564(b)(1) of the Act, 21 U.S.C.section 360bbb-3(b)(1), unless the authorization is terminated  or revoked sooner.       Influenza A by PCR NEGATIVE NEGATIVE   Influenza B by PCR NEGATIVE NEGATIVE    Comment: (NOTE) The Xpert Xpress SARS-CoV-2/FLU/RSV plus assay is intended as an aid in the diagnosis of influenza from Nasopharyngeal swab specimens and should not be used as a sole basis for treatment. Nasal washings and aspirates are unacceptable for Xpert Xpress SARS-CoV-2/FLU/RSV testing.  Fact Sheet for Patients: BloggerCourse.com  Fact Sheet for Healthcare Providers: SeriousBroker.it  This test is not yet approved or cleared by the [place] FDA and has been authorized for detection and/or diagnosis of SARS-CoV-2 by FDA under an Emergency Use Authorization (EUA). This EUA will remain in effect (meaning this test can be used) for the duration of the COVID-19 declaration under Section 564(b)(1) of the Act, 21 U.S.C. section 360bbb-3(b)(1), unless the authorization is terminated or revoked.  Performed at [facility] Lab, [address] [place], [place] [address]   CBC WITH DIFFERENTIAL     Status: Abnormal   Collection Time: 02/01/21 11:17 AM  Result Value Ref Range   WBC 10.2 4.0 - 10.5 K/uL   RBC 7.52 (H) 4.22 -  5.81 MIL/uL   Hemoglobin 18.8 (H) 13.0 - 17.0 g/dL   HCT 40.964.0 (H) 81.139.0 - 91.452.0 %   MCV 85.1 80.0 - 100.0 fL   MCH 25.0 (L) 26.0 - 34.0 pg   MCHC 29.4 (L) 30.0 - 36.0 g/dL   RDW 78.221.2 (H) 95.611.5 - 21.315.5 %   Platelets 169 150 - 400 K/uL   nRBC 0.2 0.0 - 0.2 %   Neutrophils Relative % 70 %   Neutro Abs 7.0 1.7 - 7.7 K/uL   Lymphocytes Relative 22 %   Lymphs Abs 2.3 0.7 - 4.0 K/uL   Monocytes Relative 8 %   Monocytes Absolute 0.8 0.1 - 1.0 K/uL   Eosinophils Relative 0 %   Eosinophils Absolute 0.0 0.0 - 0.5 K/uL   Basophils Relative 0 %   Basophils  Absolute 0.0 0.0 - 0.1 K/uL   Immature Granulocytes 0 %   Abs Immature Granulocytes 0.04 0.00 - 0.07 K/uL   Polychromasia PRESENT     Comment: Performed at [facility], [address] [place], [place] [address]  Basic metabolic panel     Status: Abnormal   Collection Time: 02/01/21 11:17 AM  Result Value Ref Range   Sodium 137 135 - 145 mmol/L   Potassium 4.8 3.5 - 5.1 mmol/L    Comment: HEMOLYSIS AT THIS LEVEL MAY AFFECT RESULT   Chloride 99 98 - 111 mmol/L   CO2 32 22 - 32 mmol/L   Glucose, Bld 110 (H) 70 - 99 mg/dL    Comment: Glucose reference range applies only to samples taken after fasting for at least 8 hours.   BUN 17 6 - 20 mg/dL   Creatinine, Ser 8.461.11 0.61 - 1.24 mg/dL   Calcium 8.5 (L) 8.9 - 10.3 mg/dL   GFR, Estimated >96>60 >29>60 mL/min    Comment: (NOTE) Calculated using the CKD-EPI Creatinine Equation (2021)    Anion gap 6 5 - 15    Comment: Performed at [facility], [address] [place], [place] [address]  Glucose, capillary     Status: Abnormal   Collection Time: 02/01/21 12:22 PM  Result Value Ref Range   Glucose-Capillary 123 (H) 70 - 99 mg/dL    Comment: Glucose reference range applies only to samples taken after fasting for at least 8 hours.  Hemoglobin A1c     Status: Abnormal   Collection Time: 02/01/21 12:47 PM  Result Value Ref Range   Hgb A1c MFr Bld 7.0 (H) 4.8 - 5.6 %    Comment: (NOTE) Pre diabetes:          5.7%-6.4%  Diabetes:              >6.4%  Glycemic control for   <7.0% adults with diabetes    Mean Plasma Glucose 154.2 mg/dL    Comment: Performed at [facility], [address] [place], [place] [address]  Save Smear     Status: None   Collection Time: 02/01/21 12:47 PM  Result Value Ref Range   Smear Review SMEAR STAINED AND AVAILABLE FOR REVIEW     Comment: Performed at [facility], [address] [place], [place] [address]  Procalcitonin - Baseline     Status: None   Collection Time: 02/01/21 12:47 PM   Result Value Ref Range   Procalcitonin <0.10 ng/mL    Comment:        Interpretation: PCT (Procalcitonin) <= 0.5 ng/mL: Systemic infection (sepsis) is not  likely. Local bacterial infection is possible. SPECIMEN HEMOLYZED. HEMOLYSIS MAY AFFECT INTEGRITY OF RESULTS. (NOTE)       Sepsis PCT Algorithm           Lower Respiratory Tract                                      Infection PCT Algorithm    ----------------------------     ----------------------------         PCT < 0.25 ng/mL                PCT < 0.10 ng/mL          Strongly encourage             Strongly discourage   discontinuation of antibiotics    initiation of antibiotics    ----------------------------     -----------------------------       PCT 0.25 - 0.50 ng/mL            PCT 0.10 - 0.25 ng/mL               OR       >80% decrease in PCT            Discourage initiation of                                            antibiotics      Encourage discontinuation           of antibiotics    ----------------------------     -----------------------------         PCT >= 0.50  ng/mL              PCT 0.26 - 0.50 ng/mL               AND       <80% decrease in PCT             Encourage initiation of                                             antibiotics       Encourage continuation           of antibiotics    ----------------------------     -----------------------------        PCT >= 0.50 ng/mL                  PCT > 0.50 ng/mL               AND         increase in PCT                  Strongly encourage                                      initiation of antibiotics    Strongly encourage escalation           of antibiotics                                     -----------------------------  PCT <= 0.25 ng/mL                                                 OR                                        > 80% decrease in PCT                                      Discontinue / Do not initiate                                              antibiotics  Performed at [facility] Lab, [address] [place], [place] [address]   Glucose, capillary     Status: Abnormal   Collection Time: 02/01/21  4:23 PM  Result Value Ref Range   Glucose-Capillary 183 (H) 70 - 99 mg/dL    Comment: Glucose reference range applies only to samples taken after fasting for at least 8 hours.  Glucose, capillary     Status: Abnormal   Collection Time: 02/01/21  9:11 PM  Result Value Ref Range   Glucose-Capillary 177 (H) 70 - 99 mg/dL    Comment: Glucose reference range applies only to samples taken after fasting for at least 8 hours.  Basic metabolic panel     Status: Abnormal   Collection Time: 02/02/21  1:13 AM  Result Value Ref Range   Sodium 137 135 - 145 mmol/L   Potassium 4.0 3.5 - 5.1 mmol/L   Chloride 97 (L) 98 - 111 mmol/L   CO2 31 22 - 32 mmol/L   Glucose, Bld 130 (H) 70 - 99 mg/dL    Comment: Glucose reference range applies only to samples taken after fasting for at least 8 hours.   BUN 20 6 - 20 mg/dL   Creatinine, Ser 6.04 0.61 - 1.24 mg/dL   Calcium 8.3 (L) 8.9 - 10.3 mg/dL   GFR, Estimated >54 >09 mL/min    Comment: (NOTE) Calculated using the CKD-EPI Creatinine Equation (2021)    Anion gap 9 5 - 15    Comment: Performed at [facility] Lab, [address], [place], [place] [address]  CBC     Status: Abnormal   Collection Time: 02/02/21  1:13 AM  Result Value Ref Range   WBC 8.9 4.0 - 10.5 K/uL   RBC 7.59 (H) 4.22 - 5.81 MIL/uL   Hemoglobin 19.0 (H) 13.0 - 17.0 g/dL   HCT 47.8 (H) 29.5 - 62.1 %   MCV 84.6 80.0 - 100.0 fL   MCH 25.0 (L) 26.0 - 34.0 pg   MCHC 29.6 (L) 30.0 - 36.0 g/dL   RDW 30.8 (H) 65.7 - 84.6 %   Platelets 176 150 - 400 K/uL    Comment: REPEATED TO VERIFY   nRBC 0.0 0.0 - 0.2 %    Comment: Performed at [facility], [address], [place], [place] [address]  Glucose, capillary     Status: Abnormal   Collection  Time: 02/02/21  6:18 AM  Result  Value Ref Range   Glucose-Capillary 111 (H) 70 - 99 mg/dL    Comment: Glucose reference range applies only to samples taken after fasting for at least 8 hours.    Current Facility-Administered Medications  Medication Dose Route Frequency Provider Last Rate Last Admin  . 0.9 %  sodium chloride infusion  250 mL Intravenous PRN Madelyn Flavors A, MD      . acetaminophen (TYLENOL) tablet 650 mg  650 mg Oral Q4H PRN Smith, Rondell A, MD      . acetylcysteine (MUCOMYST) 20 % nebulizer / oral solution 4 mL  4 mL Nebulization Q12H PRN Smith, Rondell A, MD      . amoxicillin-clavulanate (AUGMENTIN) 500-125 MG per tablet 500 mg  1 tablet Oral Q8H Smith, Rondell A, MD   500 mg at 02/02/21 0640  . aspirin EC tablet 81 mg  81 mg Oral Daily Madelyn Flavors A, MD   81 mg at 02/02/21 0102  . bisacodyl (DULCOLAX) suppository 10 mg  10 mg Rectal Daily PRN Madelyn Flavors A, MD      . budesonide (PULMICORT) nebulizer solution 0.5 mg  0.5 mg Nebulization BID Katrinka Blazing, Rondell A, MD   0.5 mg at 02/02/21 0915  . clobetasol cream (TEMOVATE) 0.05 % 1 application  1 application Topical BID Clydie Braun, MD   1 application at 02/02/21 [phone]  . clonazePAM (KLONOPIN) disintegrating tablet 0.25 mg  0.25 mg Oral BID Madelyn Flavors A, MD   0.25 mg at 02/02/21 6644  . digoxin (LANOXIN) tablet 0.25 mg  0.25 mg Oral Daily Katrinka Blazing, Rondell A, MD   0.25 mg at 02/02/21 0927  . divalproex (DEPAKOTE) DR tablet 500 mg  500 mg Oral BID Madelyn Flavors A, MD   500 mg at 02/02/21 0934  . docusate sodium (COLACE) capsule 100 mg  100 mg Oral BID Madelyn Flavors A, MD   100 mg at 02/02/21 0347  . enoxaparin (LOVENOX) injection 40 mg  40 mg Subcutaneous Q24H Madelyn Flavors A, MD   40 mg at 02/01/21 2132  . FLUoxetine (PROZAC) capsule 20 mg  20 mg Oral Daily Madelyn Flavors A, MD   20 mg at 02/02/21 0927  . gabapentin (NEURONTIN) capsule 200 mg  200 mg Oral Q12H Smith, Rondell A, MD   200 mg at 02/02/21 0927  . guaiFENesin (MUCINEX) 12 hr tablet 600  mg  600 mg Oral BID Madelyn Flavors A, MD   600 mg at 02/02/21 0927  . insulin aspart (novoLOG) injection 0-9 Units  0-9 Units Subcutaneous TID WC Clydie Braun, MD   2 Units at 02/01/21 1702  . ipratropium-albuterol (DUONEB) 0.5-2.5 (3) MG/3ML nebulizer solution 3 mL  3 mL Nebulization Q4H PRN Smith, Rondell A, MD      . lisinopril (ZESTRIL) tablet 10 mg  10 mg Oral Daily Madelyn Flavors A, MD   10 mg at 02/02/21 0927  . magnesium oxide (MAG-OX) tablet 400 mg  400 mg Oral BID Madelyn Flavors A, MD   400 mg at 02/02/21 4259  . metoprolol succinate (TOPROL-XL) 24 hr tablet 25 mg  25 mg Oral Daily Madelyn Flavors A, MD   25 mg at 02/02/21 0927  . ondansetron (ZOFRAN) injection 4 mg  4 mg Intravenous Q6H PRN Smith, Rondell A, MD      . pantoprazole sodium (PROTONIX) 40 mg/20 mL oral suspension 40 mg  40 mg Oral Daily Clydie Braun, MD   40  mg at 02/02/21 0934  . predniSONE (DELTASONE) tablet 20 mg  20 mg Oral BID WC Smith, Rondell A, MD   20 mg at 02/02/21 0927  . QUEtiapine (SEROQUEL) tablet 200 mg  200 mg Oral QHS Smith, Rondell A, MD   200 mg at 02/01/21 2131  . saccharomyces boulardii (FLORASTOR) capsule 250 mg  250 mg Oral BID Madelyn Flavors A, MD   250 mg at 02/02/21 6606  . sodium chloride flush (NS) 0.9 % injection 3 mL  3 mL Intravenous Q12H Smith, Rondell A, MD   3 mL at 02/01/21 2132  . sodium chloride flush (NS) 0.9 % injection 3 mL  3 mL Intravenous PRN Smith, Rondell A, MD      . sodium chloride HYPERTONIC 3 % nebulizer solution 4 mL  4 mL Nebulization Daily Katrinka Blazing, Rondell A, MD   4 mL at 02/02/21 0914    Musculoskeletal: Strength & Muscle Tone: within normal limits Gait & Station: normal Patient leans: N/A            Psychiatric Specialty Exam:  Presentation  General Appearance: Appropriate for Environment; Casual  Eye Contact:Fair  Speech:Clear and Coherent; Normal Rate  Speech Volume:Normal  Handedness:Right   Mood and Affect   Mood:Euthymic  Affect:Appropriate; Congruent   Thought Process  Thought Processes:Coherent; Linear  Descriptions of Associations:Intact  Orientation:Full (Time, Place and Person)  Thought Content:WDL  History of Schizophrenia/Schizoaffective disorder:No data recorded Duration of Psychotic Symptoms:No data recorded Hallucinations:Hallucinations: None  Ideas of Reference:None  Suicidal Thoughts:Suicidal Thoughts: No  Homicidal Thoughts:Homicidal Thoughts: No   Sensorium  Memory:Immediate Good; Recent Fair; Remote Fair  Judgment:Fair  Insight:Fair   Executive Functions  Concentration:Fair  Attention Span:Fair  Recall:Fair  Fund of Knowledge:Fair  Language:Fair   Psychomotor Activity  Psychomotor Activity:Psychomotor Activity: Normal   Assets  Assets:Communication Skills; Physical Health; Financial Resources/Insurance; Resilience; Social Support   Sleep  Sleep:Sleep: Fair   Physical Exam: Physical Exam Vitals and nursing note reviewed.  Constitutional:      Appearance: Normal appearance. He is normal weight.  Pulmonary:     Comments: Tracheostomy, PMV, and trach collar in place Neurological:     General: No focal deficit present.     Mental Status: He is alert and oriented to person, place, and time.  Psychiatric:        Mood and Affect: Mood normal.        Behavior: Behavior normal.        Thought Content: Thought content normal.        Judgment: Judgment normal.    Review of Systems  Psychiatric/Behavioral: Negative.  Negative for memory loss. The patient does not have insomnia.   All other systems reviewed and are negative.  Blood pressure (!) 136/59, pulse 66, temperature 98.3 F (36.8 C), temperature source Oral, resp. rate 20, height 6' (1.829 m), weight 101.9 kg, SpO2 96 %. Body mass index is 30.47 kg/m.  Treatment Plan Summary: Plan Continue current medications. Depakote level ordered for tomorrow. Patient appears to be stable  on his medications, no recommendations at this time. Discussed with patient the imprortance of watching what he says and controlling his impulsivity. He verbalizes understanding.  -Consider referral to therapist for Cognitive Behavioral Therapy.  -Continue current medications at this time. Currently does not indiciate any reason to adjust his medications.  -Psych cleared.   Disposition: No evidence of imminent risk to self or others at present.   Patient does not meet criteria for psychiatric inpatient admission.  Maryagnes Amos, FNP 02/02/2021 10:44 AM

## 2021-02-03 DIAGNOSIS — J9621 Acute and chronic respiratory failure with hypoxia: Secondary | ICD-10-CM | POA: Diagnosis not present

## 2021-02-03 LAB — BASIC METABOLIC PANEL
Anion gap: 8 (ref 5–15)
BUN: 19 mg/dL (ref 6–20)
CO2: 29 mmol/L (ref 22–32)
Calcium: 8.7 mg/dL — ABNORMAL LOW (ref 8.9–10.3)
Chloride: 97 mmol/L — ABNORMAL LOW (ref 98–111)
Creatinine, Ser: 1.13 mg/dL (ref 0.61–1.24)
GFR, Estimated: >60 mL/min (ref >60)
Glucose, Bld: 221 mg/dL — ABNORMAL HIGH (ref 70–99)
Potassium: 4.7 mmol/L (ref 3.5–5.1)
Sodium: 134 mmol/L — ABNORMAL LOW (ref 135–145)

## 2021-02-03 LAB — GLUCOSE, CAPILLARY
Glucose-Capillary: 167 mg/dL — ABNORMAL HIGH (ref 70–99)
Glucose-Capillary: 261 mg/dL — ABNORMAL HIGH (ref 70–99)
Glucose-Capillary: 242 mg/dL — ABNORMAL HIGH (ref 70–99)
Glucose-Capillary: 205 mg/dL — ABNORMAL HIGH (ref 70–99)

## 2021-02-03 LAB — CBC
HCT: 62.3 % — ABNORMAL HIGH (ref 39.0–52.0)
Hemoglobin: 18.4 g/dL — ABNORMAL HIGH (ref 13.0–17.0)
MCH: 24.9 pg — ABNORMAL LOW (ref 26.0–34.0)
MCHC: 29.5 g/dL — ABNORMAL LOW (ref 30.0–36.0)
MCV: 84.2 fL (ref 80.0–100.0)
Platelets: 174 10*3/uL (ref 150–400)
RBC: 7.4 MIL/uL — ABNORMAL HIGH (ref 4.22–5.81)
RDW: 20.5 % — ABNORMAL HIGH (ref 11.5–15.5)
WBC: 9.3 10*3/uL (ref 4.0–10.5)
nRBC: 0 % (ref 0.0–0.2)

## 2021-02-03 LAB — VALPROIC ACID LEVEL: Valproic Acid Lvl: 43 ug/mL — ABNORMAL LOW (ref 50.0–100.0)

## 2021-02-03 LAB — SARS CORONAVIRUS 2 (TAT 6-24 HRS): SARS Coronavirus 2: NEGATIVE

## 2021-02-03 NOTE — TOC Progression Note (Signed)
Transition of Care [facility]) - Progression Note    Patient Details  Name: Alias Villagran MRN: [id number] Date of Birth: 17-Sep-1961  Transition of [facility]) CM/SW Contact  Eduard Roux, [place] Phone Number: 02/03/2021, 5:12 PM  Clinical Narrative:     covid is back - CSW unable to reach facility to confirm late admission for patient  Contacted [facility]  4:35p 3:10p-informed covid will be back after 5 Sent message  4:00-4:30p 5:02p  Weekend CSW will follow up with disposition tomorrow   Antony Blackbird, MSW, LCSW Clinical Social Worker    Expected Discharge Plan: Skilled Nursing Facility Barriers to Discharge: Barriers Resolved  Expected Discharge Plan and Services Expected Discharge Plan: Skilled Nursing Facility In-house Referral: Clinical Social Work     Living arrangements for the past 2 months: Skilled Nursing Facility Expected Discharge Date: 02/03/21                                     Social Determinants of Health (SDOH) Interventions    Readmission Risk Interventions No flowsheet data found.

## 2021-02-03 NOTE — TOC Initial Note (Addendum)
Transition of Care [facility]) - Initial/Assessment Note    Patient Details  Name: Tommy Mathews MRN: [id number] Date of Birth: 1961/06/01  [facility]) CM/SW Contact:    Vinie Sill, LCSW Phone Number: 02/03/2021, 12:26 PM  Clinical Narrative:                  CSW met with patient at bedside. CSW introduced self and explained role. Patient confirmed he was from [place] and was in agreement to return. CSW called patient's mother per his request- left voice message to return call.  [facility] confirmed they can admit today- pending covid test.  Thurmond Butts, MSW, LCSW Clinical Social Worker    Expected Discharge Plan: Skilled Nursing Facility Barriers to Discharge: Barriers Resolved   Patient Goals and CMS Choice        Expected Discharge Plan and Services Expected Discharge Plan: [place] In-house Referral: Clinical Social Work     Living arrangements for the past 2 months: [place]                                      Prior Living Arrangements/Services Living arrangements for the past 2 months: [place] Lives with:: Facility Resident Patient language and need for interpreter reviewed:: No        Need for Family Participation in Patient Care: Yes (Comment) Care giver support system in place?: Yes (comment)   Criminal Activity/Legal Involvement Pertinent to Current Situation/Hospitalization: No - Comment as needed  Activities of Daily Living      Permission Sought/Granted Permission sought to share information with : Family Supports Permission granted to share information with : Yes, Verbal Permission Granted  Share Information with NAME: Iona Beard     Permission granted to share info w Relationship: mother     Emotional Assessment Appearance:: Appears stated age Attitude/Demeanor/Rapport: Engaged Affect (typically observed): Appropriate,Accepting Orientation: :  Oriented to Self,Oriented to Place,Oriented to  Time,Oriented to Situation Alcohol / Substance Use: Not Applicable Psych Involvement: No (comment)  Admission diagnosis:  Hypoxia [R09.02] Acute on chronic systolic congestive heart failure (HCC) [I50.23] Acute respiratory failure with hypoxia ([place]) [J96.01] Acute on chronic systolic CHF (congestive heart failure) ([place]) [I50.23] Acute on chronic respiratory failure with hypoxia ([place]) [J96.21] Patient Active Problem List   Diagnosis Date Noted  . COPD with acute bronchitis ([place]) 02/01/2021  . Polycythemia 02/01/2021  . Renal insufficiency 02/01/2021  . Acute on chronic diastolic CHF (congestive heart failure) ([place]) 12/14/2019  . COPD (chronic obstructive pulmonary disease) ([place])   . Sepsis with acute hypoxic respiratory failure without septic shock ([place])   . Chronic HFrEF (heart failure with reduced ejection fraction) ([place]) 08/24/2019  . Thrombocytopenia ([place]) 08/24/2019  . Severe recurrent major depression without psychotic features ([place]) 03/03/2018  . Bipolar I disorder, most recent episode depressed ([place]) 03/03/2018  . Schizophrenia ([place]) 02/27/2018  . GI bleed 01/06/2018  . Acute on chronic respiratory failure with hypoxia ([place]) 01/06/2018  . Pneumonia of both lungs due to infectious organism 01/06/2018  . Insulin-requiring or dependent type II diabetes mellitus ([place]) 01/06/2018   PCP:  Wenda Low, MD Pharmacy:   [facility] [place] [facility] [place] [address] [place] [address] Phone: [phone] Fax: [phone]     Social Determinants of Health (SDOH) Interventions    Readmission Risk Interventions No flowsheet data found.

## 2021-02-03 NOTE — Progress Notes (Signed)
Still awaiting results from COVID swab sent today before pt can be transported to SNF.  Lab called and said results should be up at 1700.  Social work notified via Engineer, technical sales.

## 2021-02-03 NOTE — Discharge Summary (Signed)
Physician Discharge Summary  Tommy Mathews ZOX:[id number] DOB: 06-09-1961 DOA: 01/31/2021  PCP: Georgann Housekeeper, MD  Admit date: 01/31/2021 Discharge date: 02/03/2021  Admitted From: SNF Disposition: SNF  Recommendations for Outpatient Follow-up:  1. Follow up with PCP in 1-2 weeks 2. Please obtain BMP/CBC in one week 3. Please follow up on the following pending results: None  Home Health: No Equipment/Devices: Trach collar, home oxygen Discharge Condition: Stable CODE STATUS: Full Diet recommendation: Heart Healthy / Carb Modified / Regular / Dysphagia   Brief/Interim Summary: Tommy Lindseyis a 60 y.o.malewith medical history significant ofHTN, DM type II, systolic CHF, paroxysmal atrial fibrillation not on anticoagulation2/2history of GI bleeds, COPD,TBIs/ptracheostomy, bipolar disorder, schizophrenia, and GERD presenting after patient reported to have been harassing staff member and the physician in charge recommended ER visit for psychiatric evaluation. Patient only notes that he had gotten some kind of disagreement with one of the staff members there. He states that over the last couple days he has had productive cough with greenish sputum production and has been feeling short of breath. He denies having any significant fever, nausea, vomiting, chest pain, change in vision, or diarrhea symptoms. After talking to staff at [facility] it appears patient had been started on Augmentin 500-125 mg to complete a 7-day course as well as prednisone.  In the emergency department, patient was noted to have oxygen saturation as low as 77%, with improvement after suctioning and his chronic trach with increase in supplemental oxygen use.  Chest x-ray with suspicious for mild infiltrates as well as superimposed interstitial edema.  He was given IV Lasix, continued on Augmentin and breathing treatments.  Patient appears at his baseline at this time and being discharged back to his facility to resume  care.  Patient to complete a total of 7 days of Augmentin and then stop it.  Patient had echocardiogram which did not reveal any CHF.  Patient has history of bipolar disorder and schizophrenia, psych was consulted and they were recommending outpatient referral for cognitive behavioral therapy.  He will continue his psych meds and does not need any inpatient psych admission.  Patient with diabetes mellitus type 2, A1c of 7 and he will continue with his current regimen.  Will continue rest of his home medications and follow-up with his providers.  Discharge Diagnoses:  Principal Problem:   Acute on chronic respiratory failure with hypoxia (HCC) Active Problems:   Schizophrenia (HCC)   Bipolar I disorder, most recent episode depressed (HCC)   Acute on chronic diastolic CHF (congestive heart failure) (HCC)   COPD with acute bronchitis (HCC)   Polycythemia   Renal insufficiency   Discharge Instructions  Discharge Instructions    Diet - low sodium heart healthy   Complete by: As directed    Increase activity slowly   Complete by: As directed      Allergies as of 02/03/2021   No Known Allergies     Medication List    STOP taking these medications   acetylcysteine 10% Soln Commonly known as: MUCOMYST   predniSONE 20 MG tablet Commonly known as: DELTASONE     TAKE these medications   albuterol (2.5 MG/3ML) 0.083% nebulizer solution Commonly known as: PROVENTIL Take 2.5 mg by nebulization every 4 (four) hours as needed for wheezing or shortness of breath (cough).   albuterol 108 (90 Base) MCG/ACT inhaler Commonly known as: VENTOLIN HFA Inhale 2 puffs into the lungs every 4 (four) hours as needed for wheezing or shortness of breath.  amoxicillin-clavulanate 500-125 MG tablet Commonly known as: AUGMENTIN Take 1 tablet by mouth in the morning and at bedtime.   aspirin EC 81 MG tablet Take 81 mg by mouth daily. Swallow whole.   bisacodyl 10 MG suppository Commonly known  as: DULCOLAX Place 1 suppository (10 mg total) rectally daily as needed for moderate constipation.   budesonide 0.5 MG/2ML nebulizer solution Commonly known as: PULMICORT Take 2 mLs (0.5 mg total) by nebulization 2 (two) times daily.   Clobetasol Propionate 0.025 % Crea Apply 1 application topically 2 (two) times daily. Apply to face, arm etc. For rahs   clonazePAM 0.5 MG tablet Commonly known as: KLONOPIN Take 0.5 tablets (0.25 mg total) by mouth 2 (two) times daily.   Derma-Smoothe/FS Scalp 0.01 % Oil Generic drug: Fluocinolone Acetonide Scalp Apply 1 application topically at bedtime.   digoxin 0.25 MG tablet Commonly known as: LANOXIN Take 1 tablet (0.25 mg total) by mouth daily.   divalproex 500 MG DR tablet Commonly known as: DEPAKOTE Take 500 mg by mouth 2 (two) times daily.   docusate sodium 100 MG capsule Commonly known as: COLACE Take 100 mg by mouth 2 (two) times daily.   FLUoxetine 20 MG capsule Commonly known as: PROZAC Take 1 capsule (20 mg total) by mouth daily.   furosemide 40 MG tablet Commonly known as: LASIX Take 1 tablet (40 mg total) by mouth 2 (two) times daily.   gabapentin 100 MG capsule Commonly known as: NEURONTIN Take 200 mg by mouth every 12 (twelve) hours.   guaiFENesin 600 MG 12 hr tablet Commonly known as: MUCINEX Take 600 mg by mouth 2 (two) times daily.   insulin glargine 100 UNIT/ML injection Commonly known as: LANTUS Inject 0.2 mLs (20 Units total) into the skin at bedtime.   ipratropium-albuterol 0.5-2.5 (3) MG/3ML Soln Commonly known as: DUONEB Take 3 mLs by nebulization every 4 (four) hours.   ketoconazole 2 % shampoo Commonly known as: NIZORAL Apply 1 application topically 2 (two) times daily.   lisinopril 10 MG tablet Commonly known as: ZESTRIL Take 1 tablet (10 mg total) by mouth daily.   magnesium oxide 400 (241.3 Mg) MG tablet Commonly known as: MAG-OX Place 1 tablet (400 mg total) into feeding tube 2 (two)  times daily. What changed: how to take this   metoprolol succinate 25 MG 24 hr tablet Commonly known as: Toprol XL Take 1 tablet (25 mg total) by mouth daily.   pantoprazole sodium 40 mg/20 mL Pack Commonly known as: PROTONIX Place 20 mLs (40 mg total) into feeding tube daily. What changed:   how to take this  additional instructions   QUEtiapine 200 MG tablet Commonly known as: SEROQUEL Take 1 tablet (200 mg total) by mouth at bedtime.   sodium chloride HYPERTONIC 3 % nebulizer solution Take 4 mLs by nebulization daily.       Follow-up Information    Georgann Housekeeper, MD. Schedule an appointment as soon as possible for a visit.   Specialty: Internal Medicine Contact information: [address] [place] [address] [phone]              No Known Allergies  Consultations:  None  Procedures/Studies: DG Chest Port 1 View  Result Date: 01/31/2021 CLINICAL DATA:  Hypoxia. EXAM: PORTABLE CHEST 1 VIEW COMPARISON:  December 19, 2019 FINDINGS: There is stable tracheostomy tube positioning. Mildly increased interstitial lung markings are seen within the mid and lower lung fields. Mild linear atelectasis is noted within the  mid right lung. Very small bilateral pleural effusions are seen. No pneumothorax is identified. The cardiac silhouette is mildly enlarged and unchanged in size. The visualized skeletal structures are unremarkable. IMPRESSION: 1. Increased interstitial lung markings within the mid and lower lung fields which is suspicious for mild infiltrates with a mild amount of superimposed interstitial edema. 2. Very small bilateral pleural effusions. Electronically Signed   By: Aram Candela M.D.   On: 01/31/2021 23:32   ECHOCARDIOGRAM COMPLETE  Result Date: 02/01/2021    ECHOCARDIOGRAM REPORT   Patient Name:   Tommy Mathews Date of Exam: 02/01/2021 Medical Rec #:  [id number]     Height:       72.0 in Accession #:    [id number]    Weight:       224.7 lb  Date of Birth:  1961/01/03     BSA:          2.239 m Patient Age:    59 years      BP:           165/94 mmHg Patient Gender: M             HR:           56 bpm. Exam Location:  Inpatient Procedure: 2D Echo, Color Doppler and Cardiac Doppler Indications:    CHF-Acute Diastolic I50.31  History:        Patient has prior history of Echocardiogram examinations, most                 recent 12/14/2019. Risk Factors:Hypertension, Dyslipidemia and                 Diabetes.  Sonographer:    Eulah Pont RDCS Referring Phys: [id number] RONDELL A SMITH IMPRESSIONS  1. Left ventricular ejection fraction, by estimation, is 55%. The left ventricle has normal function. The left ventricle has no regional wall motion abnormalities. Left ventricular diastolic parameters were normal.  2. Right ventricular systolic function is normal. The right ventricular size is normal.  3. Left atrial size was mildly dilated.  4. The pericardial effusion is posterior to the left ventricle.  5. The mitral valve is normal in structure. No evidence of mitral valve regurgitation. No evidence of mitral stenosis.  6. The aortic valve is tricuspid. There is mild calcification of the aortic valve. Aortic valve regurgitation is not visualized. Mild aortic valve sclerosis is present, with no evidence of aortic valve stenosis.  7. The inferior vena cava is normal in size with greater than 50% respiratory variability, suggesting right atrial pressure of 3 mmHg. FINDINGS  Left Ventricle: Left ventricular ejection fraction, by estimation, is 55%. The left ventricle has normal function. The left ventricle has no regional wall motion abnormalities. The left ventricular internal cavity size was normal in size. There is no left ventricular hypertrophy. Left ventricular diastolic parameters were normal. Right Ventricle: The right ventricular size is normal. No increase in right ventricular wall thickness. Right ventricular systolic function is normal. Left Atrium: Left  atrial size was mildly dilated. Right Atrium: Right atrial size was normal in size. Pericardium: Trivial pericardial effusion is present. The pericardial effusion is posterior to the left ventricle. Mitral Valve: The mitral valve is normal in structure. No evidence of mitral valve regurgitation. No evidence of mitral valve stenosis. Tricuspid Valve: The tricuspid valve is normal in structure. Tricuspid valve regurgitation is trivial. No evidence of tricuspid stenosis. Aortic Valve: The aortic valve is tricuspid. There is mild calcification of the  aortic valve. Aortic valve regurgitation is not visualized. Mild aortic valve sclerosis is present, with no evidence of aortic valve stenosis. Pulmonic Valve: The pulmonic valve was normal in structure. Pulmonic valve regurgitation is trivial. No evidence of pulmonic stenosis. Aorta: The aortic root is normal in size and structure. Venous: The inferior vena cava is normal in size with greater than 50% respiratory variability, suggesting right atrial pressure of 3 mmHg. IAS/Shunts: No atrial level shunt detected by color flow Doppler.  LEFT VENTRICLE PLAX 2D LVIDd:         4.80 cm  Diastology LVIDs:         3.20 cm  LV e' medial:    8.38 cm/s LV PW:         0.90 cm  LV E/e' medial:  12.2 LV IVS:        0.90 cm  LV e' lateral:   7.49 cm/s LVOT diam:     1.90 cm  LV E/e' lateral: 13.6 LV SV:         78 LV SV Index:   35 LVOT Area:     2.84 cm  RIGHT VENTRICLE RV S prime:     10.50 cm/s TAPSE (M-mode): 2.2 cm LEFT ATRIUM             Index       RIGHT ATRIUM           Index LA diam:        3.80 cm 1.70 cm/m  RA Area:     15.80 cm LA Vol (A2C):   38.5 ml 17.20 ml/m RA Volume:   43.50 ml  19.43 ml/m LA Vol (A4C):   38.1 ml 17.02 ml/m LA Biplane Vol: 38.9 ml 17.38 ml/m  AORTIC VALVE LVOT Vmax:   122.00 cm/s LVOT Vmean:  79.600 cm/s LVOT VTI:    0.276 m  AORTA Ao Root diam: 2.80 cm Ao Asc diam:  3.10 cm MITRAL VALVE MV Area (PHT): 3.39 cm     SHUNTS MV Decel Time: 224 msec      Systemic VTI:  0.28 m MV E velocity: 102.00 cm/s  Systemic Diam: 1.90 cm MV A velocity: 68.10 cm/s MV E/A ratio:  1.50 Charlton Haws MD Electronically signed by Charlton Haws MD Signature Date/Time: 02/01/2021/4:56:50 PM    Final     Subjective: Patient was seen and examined today.  No new complaints.  He thinks he is at his baseline and wants to go back to his facility.  Discharge Exam: Vitals:   02/03/21 0829 02/03/21 1205  BP:    Pulse: 64 62  Resp: 18 16  Temp:    SpO2: 99% 96%   Vitals:   02/03/21 0400 02/03/21 0733 02/03/21 0829 02/03/21 1205  BP:  (!) 163/92    Pulse: 65 67 64 62  Resp: 16 18 18 16   Temp:  98.7 F (37.1 C)    TempSrc:  Oral    SpO2: 91% 99% 99% 96%  Weight:      Height:        General: Pt is alert, awake, not in acute distress, trach collar in place however able to talk and appears at baseline. Cardiovascular: RRR, S1/S2 +, no rubs, no gallops Respiratory: CTA bilaterally, no wheezing, no rhonchi Abdominal: Soft, NT, ND, bowel sounds + Extremities: no edema, no cyanosis   The results of significant diagnostics from this hospitalization (including imaging, microbiology, ancillary and laboratory) are listed below for reference.  Microbiology: Recent Results (from the past 240 hour(s))  Resp Panel by RT-PCR (Flu A&B, Covid) Nasopharyngeal Swab     Status: None   Collection Time: 02/01/21 12:29 AM   Specimen: Nasopharyngeal Swab; Nasopharyngeal(NP) swabs in vial transport medium  Result Value Ref Range Status   SARS Coronavirus 2 by RT PCR NEGATIVE NEGATIVE Final    Comment: (NOTE) SARS-CoV-2 target nucleic acids are NOT DETECTED.  The SARS-CoV-2 RNA is generally detectable in upper respiratory specimens during the acute phase of infection. The lowest concentration of SARS-CoV-2 viral copies this assay can detect is 138 copies/mL. A negative result does not preclude SARS-Cov-2 infection and should not be used as the sole basis for treatment  or other patient management decisions. A negative result may occur with  improper specimen collection/handling, submission of specimen other than nasopharyngeal swab, presence of viral mutation(s) within the areas targeted by this assay, and inadequate number of viral copies(<138 copies/mL). A negative result must be combined with clinical observations, patient history, and epidemiological information. The expected result is Negative.  Fact Sheet for Patients:  BloggerCourse.com  Fact Sheet for Healthcare Providers:  SeriousBroker.it  This test is no t yet approved or cleared by the [place] FDA and  has been authorized for detection and/or diagnosis of SARS-CoV-2 by FDA under an Emergency Use Authorization (EUA). This EUA will remain  in effect (meaning this test can be used) for the duration of the COVID-19 declaration under Section 564(b)(1) of the Act, 21 U.S.C.section 360bbb-3(b)(1), unless the authorization is terminated  or revoked sooner.       Influenza A by PCR NEGATIVE NEGATIVE Final   Influenza B by PCR NEGATIVE NEGATIVE Final    Comment: (NOTE) The Xpert Xpress SARS-CoV-2/FLU/RSV plus assay is intended as an aid in the diagnosis of influenza from Nasopharyngeal swab specimens and should not be used as a sole basis for treatment. Nasal washings and aspirates are unacceptable for Xpert Xpress SARS-CoV-2/FLU/RSV testing.  Fact Sheet for Patients: BloggerCourse.com  Fact Sheet for Healthcare Providers: SeriousBroker.it  This test is not yet approved or cleared by the [place] FDA and has been authorized for detection and/or diagnosis of SARS-CoV-2 by FDA under an Emergency Use Authorization (EUA). This EUA will remain in effect (meaning this test can be used) for the duration of the COVID-19 declaration under Section 564(b)(1) of the Act, 21 U.S.C. section  360bbb-3(b)(1), unless the authorization is terminated or revoked.  Performed at [facility], [address], [place], [place] [address]      Labs: BNP (last 3 results) Recent Labs    01/31/21 2318  BNP 164.1*   Basic Metabolic Panel: Recent Labs  Lab 01/31/21 2235 02/01/21 1117 02/02/21 0113 02/03/21 0203  NA 137 137 137 134*  K 4.2 4.8 4.0 4.7  CL 99 99 97* 97*  CO2 28 32 31 29  GLUCOSE 223* 110* 130* 221*  BUN 23* 17 20 19   CREATININE 1.36* 1.11 1.16 1.13  CALCIUM 8.7* 8.5* 8.3* 8.7*   Liver Function Tests: Recent Labs  Lab 01/31/21 2235  AST 22  ALT 12  ALKPHOS 58  BILITOT 0.7  PROT 7.0  ALBUMIN 3.1*   No results for input(s): LIPASE, AMYLASE in the last 168 hours. No results for input(s): AMMONIA in the last 168 hours. CBC: Recent Labs  Lab 01/31/21 2235 02/01/21 1117 02/02/21 0113 02/03/21 0203  WBC 9.4 10.2 8.9 9.3  NEUTROABS 6.6 7.0  --   --  HGB 19.0* 18.8* 19.0* 18.4*  HCT 63.1* 64.0* 64.2* 62.3*  MCV 85.0 85.1 84.6 84.2  PLT 167 169 176 174   Cardiac Enzymes: No results for input(s): CKTOTAL, CKMB, CKMBINDEX, TROPONINI in the last 168 hours. BNP: Invalid input(s): POCBNP CBG: Recent Labs  Lab 02/02/21 1308 02/02/21 1813 02/02/21 2155 02/03/21 0600 02/03/21 1109  GLUCAP 248* 139* 192* 167* 261*   D-Dimer No results for input(s): DDIMER in the last 72 hours. Hgb A1c Recent Labs    02/01/21 1247  HGBA1C 7.0*   Lipid Profile No results for input(s): CHOL, HDL, LDLCALC, TRIG, CHOLHDL, LDLDIRECT in the last 72 hours. Thyroid function studies No results for input(s): TSH, T4TOTAL, T3FREE, THYROIDAB in the last 72 hours.  Invalid input(s): FREET3 Anemia work up No results for input(s): VITAMINB12, FOLATE, FERRITIN, TIBC, IRON, RETICCTPCT in the last 72 hours. Urinalysis    Component Value Date/Time   COLORURINE YELLOW 12/14/2019 0430   APPEARANCEUR CLEAR 12/14/2019 0430   LABSPEC 1.029 12/14/2019 0430   PHURINE  5.0 12/14/2019 0430   GLUCOSEU NEGATIVE 12/14/2019 0430   HGBUR NEGATIVE 12/14/2019 0430   BILIRUBINUR SMALL (A) 12/14/2019 0430   KETONESUR 5 (A) 12/14/2019 0430   PROTEINUR 30 (A) 12/14/2019 0430   NITRITE NEGATIVE 12/14/2019 0430   LEUKOCYTESUR TRACE (A) 12/14/2019 0430   Sepsis Labs Invalid input(s): PROCALCITONIN,  WBC,  LACTICIDVEN Microbiology Recent Results (from the past 240 hour(s))  Resp Panel by RT-PCR (Flu A&B, Covid) Nasopharyngeal Swab     Status: None   Collection Time: 02/01/21 12:29 AM   Specimen: Nasopharyngeal Swab; Nasopharyngeal(NP) swabs in vial transport medium  Result Value Ref Range Status   SARS Coronavirus 2 by RT PCR NEGATIVE NEGATIVE Final    Comment: (NOTE) SARS-CoV-2 target nucleic acids are NOT DETECTED.  The SARS-CoV-2 RNA is generally detectable in upper respiratory specimens during the acute phase of infection. The lowest concentration of SARS-CoV-2 viral copies this assay can detect is 138 copies/mL. A negative result does not preclude SARS-Cov-2 infection and should not be used as the sole basis for treatment or other patient management decisions. A negative result may occur with  improper specimen collection/handling, submission of specimen other than nasopharyngeal swab, presence of viral mutation(s) within the areas targeted by this assay, and inadequate number of viral copies(<138 copies/mL). A negative result must be combined with clinical observations, patient history, and epidemiological information. The expected result is Negative.  Fact Sheet for Patients:  BloggerCourse.com  Fact Sheet for Healthcare Providers:  SeriousBroker.it  This test is no t yet approved or cleared by the [place] FDA and  has been authorized for detection and/or diagnosis of SARS-CoV-2 by FDA under an Emergency Use Authorization (EUA). This EUA will remain  in effect (meaning this test can be used)  for the duration of the COVID-19 declaration under Section 564(b)(1) of the Act, 21 U.S.C.section 360bbb-3(b)(1), unless the authorization is terminated  or revoked sooner.       Influenza A by PCR NEGATIVE NEGATIVE Final   Influenza B by PCR NEGATIVE NEGATIVE Final    Comment: (NOTE) The Xpert Xpress SARS-CoV-2/FLU/RSV plus assay is intended as an aid in the diagnosis of influenza from Nasopharyngeal swab specimens and should not be used as a sole basis for treatment. Nasal washings and aspirates are unacceptable for Xpert Xpress SARS-CoV-2/FLU/RSV testing.  Fact Sheet for Patients: BloggerCourse.com  Fact Sheet for Healthcare Providers: SeriousBroker.it  This test is not yet approved or cleared by the [place]  FDA and has been authorized for detection and/or diagnosis of SARS-CoV-2 by FDA under an Emergency Use Authorization (EUA). This EUA will remain in effect (meaning this test can be used) for the duration of the COVID-19 declaration under Section 564(b)(1) of the Act, 21 U.S.C. section 360bbb-3(b)(1), unless the authorization is terminated or revoked.  Performed at [facility], [address] [place], [place] [id number]     Time coordinating discharge: Over 30 minutes  SIGNED:  Arnetha CourserSumayya Rayelle Armor, MD  [facility] 02/03/2021, 12:51 PM  If 7PM-7AM, please contact night-coverage [email or web address]  This record has been created using Conservation officer, historic buildingsDragon voice recognition software. Errors have been sought and corrected,but may not always be located. Such creation errors do not reflect on the standard of care.

## 2021-02-03 NOTE — Progress Notes (Signed)
Mobility Specialist: Progress Note   02/03/21 1404  Mobility  Activity Dangled on edge of bed  Level of Assistance Independent  Assistive Device None  Mobility Response Tolerated well  Mobility performed by Mobility specialist  $Mobility charge 1 Mobility   Post-Mobility: 71 HR, 94% SpO2  Pt performed LE exercises while dangling EOB. Pt asx during session. Pt back in bed with call bell and is hopeful for discharge soon.   [facility] Tommy Mathews Mobility Specialist Mobility Specialist Phone: [phone]

## 2021-02-04 ENCOUNTER — Inpatient Hospital Stay (HOSPITAL_COMMUNITY): Payer: Medicaid Other

## 2021-02-04 ENCOUNTER — Other Ambulatory Visit: Payer: Self-pay

## 2021-02-04 DIAGNOSIS — J9621 Acute and chronic respiratory failure with hypoxia: Secondary | ICD-10-CM | POA: Diagnosis not present

## 2021-02-04 LAB — COMPREHENSIVE METABOLIC PANEL
ALT: 11 U/L (ref 0–44)
AST: 15 U/L (ref 15–41)
Albumin: 2.6 g/dL — ABNORMAL LOW (ref 3.5–5.0)
Alkaline Phosphatase: 49 U/L (ref 38–126)
Anion gap: 5 (ref 5–15)
BUN: 14 mg/dL (ref 6–20)
CO2: 30 mmol/L (ref 22–32)
Calcium: 8.4 mg/dL — ABNORMAL LOW (ref 8.9–10.3)
Chloride: 101 mmol/L (ref 98–111)
Creatinine, Ser: 0.99 mg/dL (ref 0.61–1.24)
GFR, Estimated: >60 mL/min (ref >60)
Glucose, Bld: 97 mg/dL (ref 70–99)
Potassium: 4.7 mmol/L (ref 3.5–5.1)
Sodium: 136 mmol/L (ref 135–145)
Total Bilirubin: 0.7 mg/dL (ref 0.3–1.2)
Total Protein: 5.8 g/dL — ABNORMAL LOW (ref 6.5–8.1)

## 2021-02-04 LAB — CBC WITH DIFFERENTIAL/PLATELET
Abs Immature Granulocytes: 0.03 10*3/uL (ref 0.00–0.07)
Basophils Absolute: 0 10*3/uL (ref 0.0–0.1)
Basophils Relative: 1 %
Eosinophils Absolute: 0.1 10*3/uL (ref 0.0–0.5)
Eosinophils Relative: 1 %
HCT: 62.5 % — ABNORMAL HIGH (ref 39.0–52.0)
Hemoglobin: 18.4 g/dL — ABNORMAL HIGH (ref 13.0–17.0)
Immature Granulocytes: 0 %
Lymphocytes Relative: 32 %
Lymphs Abs: 2.8 10*3/uL (ref 0.7–4.0)
MCH: 24.9 pg — ABNORMAL LOW (ref 26.0–34.0)
MCHC: 29.4 g/dL — ABNORMAL LOW (ref 30.0–36.0)
MCV: 84.7 fL (ref 80.0–100.0)
Monocytes Absolute: 1 10*3/uL (ref 0.1–1.0)
Monocytes Relative: 11 %
Neutro Abs: 5 10*3/uL (ref 1.7–7.7)
Neutrophils Relative %: 55 %
Platelets: 155 10*3/uL (ref 150–400)
RBC: 7.38 MIL/uL — ABNORMAL HIGH (ref 4.22–5.81)
RDW: 20.5 % — ABNORMAL HIGH (ref 11.5–15.5)
WBC: 8.9 10*3/uL (ref 4.0–10.5)
nRBC: 0 % (ref 0.0–0.2)

## 2021-02-04 LAB — GLUCOSE, CAPILLARY
Glucose-Capillary: 113 mg/dL — ABNORMAL HIGH (ref 70–99)
Glucose-Capillary: 204 mg/dL — ABNORMAL HIGH (ref 70–99)
Glucose-Capillary: 161 mg/dL — ABNORMAL HIGH (ref 70–99)
Glucose-Capillary: 192 mg/dL — ABNORMAL HIGH (ref 70–99)

## 2021-02-04 MED ORDER — FUROSEMIDE 10 MG/ML IJ SOLN
40.0000 mg | Freq: Once | INTRAMUSCULAR | Status: AC
  Administered 2021-02-04: 40 mg via INTRAVENOUS
  Filled 2021-02-04: qty 4

## 2021-02-04 MED ORDER — FUROSEMIDE 40 MG PO TABS
40.0000 mg | ORAL_TABLET | Freq: Two times a day (BID) | ORAL | Status: DC
  Administered 2021-02-05: 40 mg via ORAL
  Filled 2021-02-04: qty 1

## 2021-02-04 MED ORDER — FUROSEMIDE 40 MG PO TABS: 40.0000 mg | ORAL_TABLET | Freq: Two times a day (BID) | ORAL | Status: DC

## 2021-02-04 NOTE — Progress Notes (Signed)
Mobility Specialist: Progress Note   02/04/21 1639  Mobility  Activity Stood at bedside  Level of Assistance Independent after set-up  Assistive Device Front wheel walker  Mobility Response Tolerated well  Mobility performed by Mobility specialist  $Mobility charge 1 Mobility   Pt asx during session. Pt performed LE exercises while sitting EOB as well as marching in place for 30 seconds. Pt performed sit to stand 2x total. Pt back in bed with call bell at his side.   [facility] Karri Kallenbach Mobility Specialist Mobility Specialist Phone: [phone]

## 2021-02-04 NOTE — TOC Progression Note (Signed)
Transition of Care [facility]) - Progression Note    Patient Details  Name: Saharsh Sterling MRN: [id number] Date of Birth: [birth year]  [facility] CM/SW Contact  Patrice [place], [place] Phone Number: [phone] 02/04/2021, 9:15 AM  Clinical Narrative:     CSW called Rahse[facility] at [facility] and awaiting a call back.  TOC team will continue to assist with discharge planning needs.   Expected Discharge Plan: Skilled Nursing Facility Barriers to Discharge: Barriers Resolved  Expected Discharge Plan and Services Expected Discharge Plan: Skilled Nursing Facility In-house Referral: Clinical Social Work     Living arrangements for the past 2 months: Skilled Nursing Facility Expected Discharge Date: 02/03/21                                     Social Determinants of Health (SDOH) Interventions    Readmission Risk Interventions No flowsheet data found.

## 2021-02-04 NOTE — TOC Progression Note (Addendum)
Transition of Care [facility]) - Progression Note    Patient Details  Name: Tommy Mathews MRN: [id number] Date of Birth: 1961/08/24  [facility] CM/SW Contact  Patrice Paradise, LCSW Phone Number: [phone] 02/04/2021, 10:57 AM  Clinical Narrative:     CSW called facility directly and was informed that they did not have a record of him being a resident. CSW informed front desk staff that it was confirmed that he could return today. Front Information systems manager gave CSW CON phone number. CSW called DON and had to leave a message.  11:25 am- CSW was alerted by MD Lowell Guitar to ascertain pt's 02 baseline from facility and CSW let him know that CSW has reached out and awaiting to hear back.  1:30pm- CSW received call from Mora DON at facility. She confirmed that pt was a resident at facility. CSW asked about the liters of O2. Through several calls back and forth, only the percentage was confirmed at 20%. CSW relayed information to MD Lowell Guitar and he requested the liters.  Kathie Rhodes was unsure of actual liters and stated that he was not using the O2 as much at facility. She asked if his condition could be reassessed on Monday. CSW relayed information to MD.  [facility] team will continue to assist with discharge planning needs.  Expected Discharge Plan: Skilled Nursing Facility Barriers to Discharge: Barriers Resolved  Expected Discharge Plan and Services Expected Discharge Plan: Skilled Nursing Facility In-house Referral: Clinical Social Work     Living arrangements for the past 2 months: Skilled Nursing Facility Expected Discharge Date: 02/03/21                                     Social Determinants of Health (SDOH) Interventions    Readmission Risk Interventions No flowsheet data found.

## 2021-02-04 NOTE — Plan of Care (Signed)

## 2021-02-04 NOTE — Discharge Summary (Addendum)
Physician Discharge Summary  Zorita PangJames Grose WUJ:[id number] DOB: 1960-11-02 DOA: 59/11/2020  PCP: Georgann HousekeeperHusain, Karrar, MD  Admit date: 01/31/2021 Discharge date: 02/04/2021  Time spent: 40 minutes  Recommendations for Outpatient Follow-up:  1. Follow outpatient CBC/CMP  2. Follow respiratory status outpatient 3. Follow chest x ray outpatient in 2-3 weeks 4. EPO elevated, possibly related to chronic resp issues -> follow elevated epo, polycythemia outpatient - consider heme follow up  5. Needs outpatient referral for cognitive behavioral therapy 6. Follow up with psych outpatient   Continues to require more oxygen than his baseline, tried to wean to 5 L on trach collar this AM, but has failed this.  Will continue diuresis, follow inpatient until he's closer to his baseline.  Discharge Diagnoses:  Principal Problem:   Acute on chronic respiratory failure with hypoxia (HCC) Active Problems:   Schizophrenia (HCC)   Bipolar I disorder, most recent episode depressed (HCC)   Acute on chronic diastolic CHF (congestive heart failure) (HCC)   COPD with acute bronchitis (HCC)   Polycythemia   Renal insufficiency   Discharge Condition: stable  Diet recommendation: heart healthy  Filed Weights   02/02/21 0707 02/03/21 0342 02/04/21 0330  Weight: 101.9 kg 102.7 kg 100.7 kg    History of present illness:  Tommy FearingJames Lindseyis Tommy Mathews 60 y.o.malewith medical history significant ofHTN, DM type II, systolic CHF, paroxysmal atrial fibrillation not on anticoagulation2/2history of GI bleeds, COPD,TBIs/ptracheostomy, bipolar disorder, schizophrenia, and GERD presenting after patient reported to have been harassing staff memberand thephysician in charge recommended ER visit for psychiatric evaluation. Patient only notes that he had gotten some kind of disagreement with one of the staff members there. He states that over the last couple days he has had productive cough with greenish sputum production and has  been feeling short of breath. He denies having any significant fever, nausea, vomiting, chest pain, change in vision, or diarrhea symptoms. [facility] appears patient had been started on Augmentin 500-125 mg to complete Tommy Mathews 7-day course as well as prednisone.In the emergency department, patient was noted to have oxygen saturation as low as 77%, with improvement after suctioning and his chronic trach with increase in supplemental oxygen use. Chest x-ray with suspicious for mild infiltrates as well as superimposed interstitial edema. He was given IV Lasix, continued on Augmentin and breathing treatments.  Patient appears at his baseline at this time and being discharged back to his facility to resume care.  Patient to complete Tommy Mathews total of 7 days of Augmentin and then stop it.  Patient had echocardiogram which did not show decreased EF.  Patient has history of bipolar disorder and schizophrenia, psych was consulted and they were recommending outpatient referral for cognitive behavioral therapy.  He will continue his psych meds and does not need any inpatient psych admission.  Patient with diabetes mellitus type 2, A1c of 7 and he will continue with his current regimen.  Will continue rest of his home medications and follow-up with his providers.  See below for additional details  Hospital Course:  Acute on chronic hypoxemic respiratory failure with chronic trach -Secondary to acute bronchitis -on 7 L trach collar this AM, weaned to 5 L and maintaining sats in 90's -Chest x-ray showed interstitial lung markings with mild infiltrates with mild amount of superimposed interstitial edema - needs follow up CXR as outpatient -BNP 164.1 -Procalcitonin negative -Patient was initially given 1 dose of IV Lasix.  Echocardiogram did not reveal CHF -> resume home  lasix dose at discharge -Continue Augmentin (complete 7 day course), breathing treatments, steroids completed (no  wheezing, can be d/c'd)  Polycythemia -Peripheral smear, EPO is elevated -> ? Secondary to underlying respiratory issues  -needs continued follow up outpatient, consider hematology follow up outpatient  History of TBI status post tracheostomy -Trach care and suctioning as needed  Paroxysmal atrial fibrillation -CHA2DS2-VASc score 4, not on anticoagulation due to history of GI bleeding -Continue aspirin, digoxin, metoprolol -In normal sinus rhythm this morning  HTN -Continue lisinopril, Toprol  History of bipolar disorder, schizophrenia -Continue Seroquel, Klonopin, Depakote, Prozac -Psychiatry consulted, recommended for outpatient referral for cognitive behavioral therapy.  No recommendations to adjust his medications at this point.  Does not need inpatient psych admission.  Diabetes mellitus type 2 -Hemoglobin A1c 7.0 -Continue sliding scale insulin  Procedures: Echo IMPRESSIONS    1. Left ventricular ejection fraction, by estimation, is 55%. The left  ventricle has normal function. The left ventricle has no regional wall  motion abnormalities. Left ventricular diastolic parameters were normal.  2. Right ventricular systolic function is normal. The right ventricular  size is normal.  3. Left atrial size was mildly dilated.  4. The pericardial effusion is posterior to the left ventricle.  5. The mitral valve is normal in structure. No evidence of mitral valve  regurgitation. No evidence of mitral stenosis.  6. The aortic valve is tricuspid. There is mild calcification of the  aortic valve. Aortic valve regurgitation is not visualized. Mild aortic  valve sclerosis is present, with no evidence of aortic valve stenosis.  7. The inferior vena cava is normal in size with greater than 50%  respiratory variability, suggesting right atrial pressure of 3 mmHg.  Consultations:  none  Discharge Exam: Vitals:   02/03/21 2345 02/04/21 0330  BP:  (!) 147/85  Pulse:  64 60  Resp: 15 16  Temp:  98.6 F (37 C)  SpO2:  97%   No new complaints, asking to be discharged  General: No acute distress. Cardiovascular: Heart sounds show Tayler Heiden regular rate, and rhythm.  Lungs: ctab, transmitted upper airway sounds better appreciated on anterior exam - trach Abdomen: Soft, nontender, nondistended  Neurological: Alert and oriented 3. Moves all extremities 4. Cranial nerves II through XII grossly intact. Skin: Warm and dry. No rashes or lesions. Extremities: No clubbing or cyanosis. No edema.  Discharge Instructions   Discharge Instructions    Diet - low sodium heart healthy   Complete by: As directed    Increase activity slowly   Complete by: As directed      Allergies as of 02/04/2021   No Known Allergies     Medication List    STOP taking these medications   acetylcysteine 10% Soln Commonly known as: MUCOMYST   predniSONE 20 MG tablet Commonly known as: DELTASONE     TAKE these medications   albuterol (2.5 MG/3ML) 0.083% nebulizer solution Commonly known as: PROVENTIL Take 2.5 mg by nebulization every 4 (four) hours as needed for wheezing or shortness of breath (cough).   albuterol 108 (90 Base) MCG/ACT inhaler Commonly known as: VENTOLIN HFA Inhale 2 puffs into the lungs every 4 (four) hours as needed for wheezing or shortness of breath.   amoxicillin-clavulanate 500-125 MG tablet Commonly known as: AUGMENTIN Take 1 tablet by mouth in the morning and at bedtime.   aspirin EC 81 MG tablet Take 81 mg by mouth daily. Swallow whole.   bisacodyl 10 MG suppository Commonly known as: DULCOLAX Place  1 suppository (10 mg total) rectally daily as needed for moderate constipation.   budesonide 0.5 MG/2ML nebulizer solution Commonly known as: PULMICORT Take 2 mLs (0.5 mg total) by nebulization 2 (two) times daily.   Clobetasol Propionate 0.025 % Crea Apply 1 application topically 2 (two) times daily. Apply to face, arm etc. For rahs    clonazePAM 0.5 MG tablet Commonly known as: KLONOPIN Take 0.5 tablets (0.25 mg total) by mouth 2 (two) times daily.   Derma-Smoothe/FS Scalp 0.01 % Oil Generic drug: Fluocinolone Acetonide Scalp Apply 1 application topically at bedtime.   digoxin 0.25 MG tablet Commonly known as: LANOXIN Take 1 tablet (0.25 mg total) by mouth daily.   divalproex 500 MG DR tablet Commonly known as: DEPAKOTE Take 500 mg by mouth 2 (two) times daily.   docusate sodium 100 MG capsule Commonly known as: COLACE Take 100 mg by mouth 2 (two) times daily.   FLUoxetine 20 MG capsule Commonly known as: PROZAC Take 1 capsule (20 mg total) by mouth daily.   furosemide 40 MG tablet Commonly known as: LASIX Take 1 tablet (40 mg total) by mouth 2 (two) times daily.   gabapentin 100 MG capsule Commonly known as: NEURONTIN Take 200 mg by mouth every 12 (twelve) hours.   guaiFENesin 600 MG 12 hr tablet Commonly known as: MUCINEX Take 600 mg by mouth 2 (two) times daily.   insulin glargine 100 UNIT/ML injection Commonly known as: LANTUS Inject 0.2 mLs (20 Units total) into the skin at bedtime.   ipratropium-albuterol 0.5-2.5 (3) MG/3ML Soln Commonly known as: DUONEB Take 3 mLs by nebulization every 4 (four) hours.   ketoconazole 2 % shampoo Commonly known as: NIZORAL Apply 1 application topically 2 (two) times daily.   lisinopril 10 MG tablet Commonly known as: ZESTRIL Take 1 tablet (10 mg total) by mouth daily.   magnesium oxide 400 (241.3 Mg) MG tablet Commonly known as: MAG-OX Place 1 tablet (400 mg total) into feeding tube 2 (two) times daily. What changed: how to take this   metoprolol succinate 25 MG 24 hr tablet Commonly known as: Toprol XL Take 1 tablet (25 mg total) by mouth daily.   pantoprazole sodium 40 mg/20 mL Pack Commonly known as: PROTONIX Place 20 mLs (40 mg total) into feeding tube daily. What changed:   how to take this  additional instructions   QUEtiapine 200  MG tablet Commonly known as: SEROQUEL Take 1 tablet (200 mg total) by mouth at bedtime.   sodium chloride HYPERTONIC 3 % nebulizer solution Take 4 mLs by nebulization daily.      No Known Allergies  Follow-up Information    Georgann Housekeeper, MD. Schedule an appointment as soon as possible for Quashon Jesus visit.   Specialty: Internal Medicine Contact information: [address] [place] [address] [phone]                The results of significant diagnostics from this hospitalization (including imaging, microbiology, ancillary and laboratory) are listed below for reference.    Significant Diagnostic Studies: DG Chest Port 1 View  Result Date: 01/31/2021 CLINICAL DATA:  Hypoxia. EXAM: PORTABLE CHEST 1 VIEW COMPARISON:  December 19, 2019 FINDINGS: There is stable tracheostomy tube positioning. Mildly increased interstitial lung markings are seen within the mid and lower lung fields. Mild linear atelectasis is noted within the mid right lung. Very small bilateral pleural effusions are seen. No pneumothorax is identified. The cardiac silhouette is mildly enlarged and unchanged in size.  The visualized skeletal structures are unremarkable. IMPRESSION: 1. Increased interstitial lung markings within the mid and lower lung fields which is suspicious for mild infiltrates with Akiera Allbaugh mild amount of superimposed interstitial edema. 2. Very small bilateral pleural effusions. Electronically Signed   By: Aram Candela M.D.   On: 01/31/2021 23:32   ECHOCARDIOGRAM COMPLETE  Result Date: 02/01/2021    ECHOCARDIOGRAM REPORT   Patient Name:   Tommy Mathews Date of Exam: 02/01/2021 Medical Rec #:  [id number]     Height:       72.0 in Accession #:    [id number]    Weight:       224.7 lb Date of Birth:  1961-09-12     BSA:          2.239 m Patient Age:    59 years      BP:           165/94 mmHg Patient Gender: M             HR:           56 bpm. Exam Location:  Inpatient Procedure: 2D Echo, Color Doppler  and Cardiac Doppler Indications:    CHF-Acute Diastolic I50.31  History:        Patient has prior history of Echocardiogram examinations, most                 recent 12/14/2019. Risk Factors:Hypertension, Dyslipidemia and                 Diabetes.  Sonographer:    Eulah Pont RDCS Referring Phys: [id number] RONDELL Cristalle Rohm SMITH IMPRESSIONS  1. Left ventricular ejection fraction, by estimation, is 55%. The left ventricle has normal function. The left ventricle has no regional wall motion abnormalities. Left ventricular diastolic parameters were normal.  2. Right ventricular systolic function is normal. The right ventricular size is normal.  3. Left atrial size was mildly dilated.  4. The pericardial effusion is posterior to the left ventricle.  5. The mitral valve is normal in structure. No evidence of mitral valve regurgitation. No evidence of mitral stenosis.  6. The aortic valve is tricuspid. There is mild calcification of the aortic valve. Aortic valve regurgitation is not visualized. Mild aortic valve sclerosis is present, with no evidence of aortic valve stenosis.  7. The inferior vena cava is normal in size with greater than 50% respiratory variability, suggesting right atrial pressure of 3 mmHg. FINDINGS  Left Ventricle: Left ventricular ejection fraction, by estimation, is 55%. The left ventricle has normal function. The left ventricle has no regional wall motion abnormalities. The left ventricular internal cavity size was normal in size. There is no left ventricular hypertrophy. Left ventricular diastolic parameters were normal. Right Ventricle: The right ventricular size is normal. No increase in right ventricular wall thickness. Right ventricular systolic function is normal. Left Atrium: Left atrial size was mildly dilated. Right Atrium: Right atrial size was normal in size. Pericardium: Trivial pericardial effusion is present. The pericardial effusion is posterior to the left ventricle. Mitral Valve: The  mitral valve is normal in structure. No evidence of mitral valve regurgitation. No evidence of mitral valve stenosis. Tricuspid Valve: The tricuspid valve is normal in structure. Tricuspid valve regurgitation is trivial. No evidence of tricuspid stenosis. Aortic Valve: The aortic valve is tricuspid. There is mild calcification of the aortic valve. Aortic valve regurgitation is not visualized. Mild aortic valve sclerosis is present, with no evidence of aortic valve stenosis. Pulmonic Valve: The  pulmonic valve was normal in structure. Pulmonic valve regurgitation is trivial. No evidence of pulmonic stenosis. Aorta: The aortic root is normal in size and structure. Venous: The inferior vena cava is normal in size with greater than 50% respiratory variability, suggesting right atrial pressure of 3 mmHg. IAS/Shunts: No atrial level shunt detected by color flow Doppler.  LEFT VENTRICLE PLAX 2D LVIDd:         4.80 cm  Diastology LVIDs:         3.20 cm  LV e' medial:    8.38 cm/s LV PW:         0.90 cm  LV E/e' medial:  12.2 LV IVS:        0.90 cm  LV e' lateral:   7.49 cm/s LVOT diam:     1.90 cm  LV E/e' lateral: 13.6 LV SV:         78 LV SV Index:   35 LVOT Area:     2.84 cm  RIGHT VENTRICLE RV S prime:     10.50 cm/s TAPSE (M-mode): 2.2 cm LEFT ATRIUM             Index       RIGHT ATRIUM           Index LA diam:        3.80 cm 1.70 cm/m  RA Area:     15.80 cm LA Vol (A2C):   38.5 ml 17.20 ml/m RA Volume:   43.50 ml  19.43 ml/m LA Vol (A4C):   38.1 ml 17.02 ml/m LA Biplane Vol: 38.9 ml 17.38 ml/m  AORTIC VALVE LVOT Vmax:   122.00 cm/s LVOT Vmean:  79.600 cm/s LVOT VTI:    0.276 m  AORTA Ao Root diam: 2.80 cm Ao Asc diam:  3.10 cm MITRAL VALVE MV Area (PHT): 3.39 cm     SHUNTS MV Decel Time: 224 msec     Systemic VTI:  0.28 m MV E velocity: 102.00 cm/s  Systemic Diam: 1.90 cm MV Mychael Smock velocity: 68.10 cm/s MV E/Sumeet Geter ratio:  1.50 Charlton Haws MD Electronically signed by Charlton Haws MD Signature Date/Time: 02/01/2021/4:56:50  PM    Final     Microbiology: Recent Results (from the past 240 hour(s))  Resp Panel by RT-PCR (Flu Jamine Highfill&B, Covid) Nasopharyngeal Swab     Status: None   Collection Time: 02/01/21 12:29 AM   Specimen: Nasopharyngeal Swab; Nasopharyngeal(NP) swabs in vial transport medium  Result Value Ref Range Status   SARS Coronavirus 2 by RT PCR NEGATIVE NEGATIVE Final    Comment: (NOTE) SARS-CoV-2 target nucleic acids are NOT DETECTED.  The SARS-CoV-2 RNA is generally detectable in upper respiratory specimens during the acute phase of infection. The lowest concentration of SARS-CoV-2 viral copies this assay can detect is 138 copies/mL. Pal Shell negative result does not preclude SARS-Cov-2 infection and should not be used as the sole basis for treatment or other patient management decisions. Londan Coplen negative result may occur with  improper specimen collection/handling, submission of specimen other than nasopharyngeal swab, presence of viral mutation(s) within the areas targeted by this assay, and inadequate number of viral copies(<138 copies/mL). Kyndell Zeiser negative result must be combined with clinical observations, patient history, and epidemiological information. The expected result is Negative.  Fact Sheet for Patients:  BloggerCourse.com  Fact Sheet for Healthcare Providers:  SeriousBroker.it  This test is no t yet approved or cleared by the [place] FDA and  has been authorized for detection and/or diagnosis of SARS-CoV-2  by FDA under an Emergency Use Authorization (EUA). This EUA will remain  in effect (meaning this test can be used) for the duration of the COVID-19 declaration under Section 564(b)(1) of the Act, 21 U.S.C.section 360bbb-3(b)(1), unless the authorization is terminated  or revoked sooner.       Influenza Yogesh Cominsky by PCR NEGATIVE NEGATIVE Final   Influenza B by PCR NEGATIVE NEGATIVE Final    Comment: (NOTE) The Xpert Xpress SARS-CoV-2/FLU/RSV  plus assay is intended as an aid in the diagnosis of influenza from Nasopharyngeal swab specimens and should not be used as Maximum Reiland sole basis for treatment. Nasal washings and aspirates are unacceptable for Xpert Xpress SARS-CoV-2/FLU/RSV testing.  Fact Sheet for Patients: BloggerCourse.com  Fact Sheet for Healthcare Providers: SeriousBroker.it  This test is not yet approved or cleared by the [place] FDA and has been authorized for detection and/or diagnosis of SARS-CoV-2 by FDA under an Emergency Use Authorization (EUA). This EUA will remain in effect (meaning this test can be used) for the duration of the COVID-19 declaration under Section 564(b)(1) of the Act, 21 U.S.C. section 360bbb-3(b)(1), unless the authorization is terminated or revoked.  Performed at [facility], [address], [place], [place] [address]   SARS CORONAVIRUS 2 (TAT 6-24 HRS) Nasopharyngeal Nasopharyngeal Swab     Status: None   Collection Time: 02/03/21 11:20 AM   Specimen: Nasopharyngeal Swab  Result Value Ref Range Status   SARS Coronavirus 2 NEGATIVE NEGATIVE Final    Comment: (NOTE) SARS-CoV-2 target nucleic acids are NOT DETECTED.  The SARS-CoV-2 RNA is generally detectable in upper and lower respiratory specimens during the acute phase of infection. Negative results do not preclude SARS-CoV-2 infection, do not rule out co-infections with other pathogens, and should not be used as the sole basis for treatment or other patient management decisions. Negative results must be combined with clinical observations, patient history, and epidemiological information. The expected result is Negative.  Fact Sheet for Patients: HairSlick.no  Fact Sheet for Healthcare Providers: quierodirigir.com  This test is not yet approved or cleared by the [place] FDA and  has been authorized for  detection and/or diagnosis of SARS-CoV-2 by FDA under an Emergency Use Authorization (EUA). This EUA will remain  in effect (meaning this test can be used) for the duration of the COVID-19 declaration under Se ction 564(b)(1) of the Act, 21 U.S.C. section 360bbb-3(b)(1), unless the authorization is terminated or revoked sooner.  Performed at [facility], [address] [place], [place] [address]      Labs: Basic Metabolic Panel: Recent Labs  Lab 01/31/21 2235 02/01/21 1117 02/02/21 0113 02/03/21 0203  NA 137 137 137 134*  K 4.2 4.8 4.0 4.7  CL 99 99 97* 97*  CO2 28 32 31 29  GLUCOSE 223* 110* 130* 221*  BUN 23* 17 20 19   CREATININE 1.36* 1.11 1.16 1.13  CALCIUM 8.7* 8.5* 8.3* 8.7*   Liver Function Tests: Recent Labs  Lab 01/31/21 2235  AST 22  ALT 12  ALKPHOS 58  BILITOT 0.7  PROT 7.0  ALBUMIN 3.1*   No results for input(s): LIPASE, AMYLASE in the last 168 hours. No results for input(s): AMMONIA in the last 168 hours. CBC: Recent Labs  Lab 01/31/21 2235 02/01/21 1117 02/02/21 0113 02/03/21 0203  WBC 9.4 10.2 8.9 9.3  NEUTROABS 6.6 7.0  --   --   HGB 19.0* 18.8* 19.0* 18.4*  HCT 63.1* 64.0* 64.2* 62.3*  MCV 85.0 85.1  84.6 84.2  PLT 167 169 176 174   Cardiac Enzymes: No results for input(s): CKTOTAL, CKMB, CKMBINDEX, TROPONINI in the last 168 hours. BNP: BNP (last 3 results) Recent Labs    01/31/21 2318  BNP 164.1*    ProBNP (last 3 results) No results for input(s): PROBNP in the last 8760 hours.  CBG: Recent Labs  Lab 02/03/21 0600 02/03/21 1109 02/03/21 1546 02/03/21 2134 02/04/21 0612  GLUCAP 167* 261* 242* 205* 113*       Signed:  Lacretia Nicks MD.  [facility] 02/04/2021, 8:38 AM

## 2021-02-04 NOTE — Plan of Care (Signed)
  Problem: Education: Goal: Knowledge of General Education information will improve Description: Including pain rating scale, medication(s)/side effects and non-pharmacologic comfort measures 02/04/2021 2257 by Orson Ape, RN Outcome: Progressing 02/04/2021 2256 by Orson Ape, RN Outcome: Progressing   Problem: Health Behavior/Discharge Planning: Goal: Ability to manage health-related needs will improve 02/04/2021 2257 by Orson Ape, RN Outcome: Progressing 02/04/2021 2256 by Orson Ape, RN Outcome: Progressing   Problem: Clinical Measurements: Goal: Ability to maintain clinical measurements within normal limits will improve 02/04/2021 2257 by Orson Ape, RN Outcome: Progressing 02/04/2021 2256 by Orson Ape, RN Outcome: Progressing Goal: Will remain free from infection 02/04/2021 2257 by Orson Ape, RN Outcome: Progressing 02/04/2021 2256 by Orson Ape, RN Outcome: Progressing Goal: Diagnostic test results will improve 02/04/2021 2257 by Orson Ape, RN Outcome: Progressing 02/04/2021 2256 by Orson Ape, RN Outcome: Progressing Goal: Respiratory complications will improve 02/04/2021 2257 by Orson Ape, RN Outcome: Progressing 02/04/2021 2256 by Orson Ape, RN Outcome: Progressing Goal: Cardiovascular complication will be avoided 02/04/2021 2257 by Orson Ape, RN Outcome: Progressing 02/04/2021 2256 by Orson Ape, RN Outcome: Progressing   Problem: Activity: Goal: Risk for activity intolerance will decrease 02/04/2021 2257 by Orson Ape, RN Outcome: Progressing 02/04/2021 2256 by Orson Ape, RN Outcome: Progressing   Problem: Nutrition: Goal: Adequate nutrition will be maintained 02/04/2021 2257 by Orson Ape, RN Outcome: Progressing 02/04/2021 2256 by Orson Ape, RN Outcome: Progressing   Problem: Coping: Goal: Level of anxiety will decrease 02/04/2021 2257 by Orson Ape, RN Outcome: Progressing 02/04/2021 2256 by Orson Ape, RN Outcome: Progressing   Problem: Elimination: Goal: Will not experience complications related to bowel motility 02/04/2021 2257 by Orson Ape, RN Outcome: Progressing 02/04/2021 2256 by Orson Ape, RN Outcome: Progressing Goal: Will not experience complications related to urinary retention 02/04/2021 2257 by Orson Ape, RN Outcome: Progressing 02/04/2021 2256 by Orson Ape, RN Outcome: Progressing   Problem: Pain Managment: Goal: General experience of comfort will improve 02/04/2021 2257 by Orson Ape, RN Outcome: Progressing 02/04/2021 2256 by Orson Ape, RN Outcome: Progressing   Problem: Safety: Goal: Ability to remain free from injury will improve 02/04/2021 2257 by Orson Ape, RN Outcome: Progressing 02/04/2021 2256 by Orson Ape, RN Outcome: Progressing   Problem: Skin Integrity: Goal: Risk for impaired skin integrity will decrease 02/04/2021 2257 by Orson Ape, RN Outcome: Progressing 02/04/2021 2256 by Orson Ape, RN Outcome: Progressing   Problem: Education: Goal: Ability to demonstrate management of disease process will improve 02/04/2021 2257 by Orson Ape, RN Outcome: Progressing 02/04/2021 2256 by Orson Ape, RN Outcome: Progressing Goal: Ability to verbalize understanding of medication therapies will improve 02/04/2021 2257 by Orson Ape, RN Outcome: Progressing 02/04/2021 2256 by Orson Ape, RN Outcome: Progressing Goal: Individualized Educational Video(s) 02/04/2021 2257 by Orson Ape, RN Outcome: Progressing 02/04/2021 2256 by Orson Ape, RN Outcome: Progressing   Problem: Activity: Goal: Capacity to carry out activities will improve 02/04/2021 2257 by Orson Ape, RN Outcome: Progressing 02/04/2021 2256 by Orson Ape, RN Outcome: Progressing   Problem: Cardiac: Goal: Ability to  achieve and maintain adequate cardiopulmonary perfusion will improve 02/04/2021 2257 by Orson Ape, RN Outcome: Progressing 02/04/2021 2256 by Orson Ape, RN Outcome: Progressing

## 2021-02-04 NOTE — Progress Notes (Signed)
Patient was attempted to wean down oxygen requirements.  At start of shift patient was on 7L trach collar with SpO2 88-90 %,  On 5L trach collar patient's SpO2 was 84 %.  Placed patient on 10 L/40 % FiO2 with SpO2 at 88-92%.  Currently patient appears to maintain SpO2 at 7 L 89-92 %.

## 2021-02-05 ENCOUNTER — Encounter (HOSPITAL_COMMUNITY): Payer: Self-pay | Admitting: Internal Medicine

## 2021-02-05 DIAGNOSIS — J9621 Acute and chronic respiratory failure with hypoxia: Secondary | ICD-10-CM | POA: Diagnosis not present

## 2021-02-05 LAB — CBC WITH DIFFERENTIAL/PLATELET
Abs Immature Granulocytes: 0.05 10*3/uL (ref 0.00–0.07)
Basophils Absolute: 0 10*3/uL (ref 0.0–0.1)
Basophils Relative: 0 %
Eosinophils Absolute: 0 10*3/uL (ref 0.0–0.5)
Eosinophils Relative: 0 %
HCT: 61.5 % — ABNORMAL HIGH (ref 39.0–52.0)
Hemoglobin: 18.4 g/dL — ABNORMAL HIGH (ref 13.0–17.0)
Immature Granulocytes: 1 %
Lymphocytes Relative: 23 %
Lymphs Abs: 2.3 10*3/uL (ref 0.7–4.0)
MCH: 25.1 pg — ABNORMAL LOW (ref 26.0–34.0)
MCHC: 29.9 g/dL — ABNORMAL LOW (ref 30.0–36.0)
MCV: 84 fL (ref 80.0–100.0)
Monocytes Absolute: 0.8 10*3/uL (ref 0.1–1.0)
Monocytes Relative: 8 %
Neutro Abs: 6.7 10*3/uL (ref 1.7–7.7)
Neutrophils Relative %: 68 %
Platelets: 171 10*3/uL (ref 150–400)
RBC: 7.32 MIL/uL — ABNORMAL HIGH (ref 4.22–5.81)
RDW: 19.9 % — ABNORMAL HIGH (ref 11.5–15.5)
WBC: 9.9 10*3/uL (ref 4.0–10.5)
nRBC: 0 % (ref 0.0–0.2)

## 2021-02-05 LAB — PHOSPHORUS: Phosphorus: 3.2 mg/dL (ref 2.5–4.6)

## 2021-02-05 LAB — GLUCOSE, CAPILLARY
Glucose-Capillary: 139 mg/dL — ABNORMAL HIGH (ref 70–99)
Glucose-Capillary: 249 mg/dL — ABNORMAL HIGH (ref 70–99)
Glucose-Capillary: 201 mg/dL — ABNORMAL HIGH (ref 70–99)
Glucose-Capillary: 267 mg/dL — ABNORMAL HIGH (ref 70–99)

## 2021-02-05 LAB — COMPREHENSIVE METABOLIC PANEL
ALT: 11 U/L (ref 0–44)
AST: 18 U/L (ref 15–41)
Albumin: 2.7 g/dL — ABNORMAL LOW (ref 3.5–5.0)
Alkaline Phosphatase: 52 U/L (ref 38–126)
Anion gap: 6 (ref 5–15)
BUN: 20 mg/dL (ref 6–20)
CO2: 31 mmol/L (ref 22–32)
Calcium: 8.8 mg/dL — ABNORMAL LOW (ref 8.9–10.3)
Chloride: 97 mmol/L — ABNORMAL LOW (ref 98–111)
Creatinine, Ser: 1.08 mg/dL (ref 0.61–1.24)
GFR, Estimated: >60 mL/min (ref >60)
Glucose, Bld: 181 mg/dL — ABNORMAL HIGH (ref 70–99)
Potassium: 4.4 mmol/L (ref 3.5–5.1)
Sodium: 134 mmol/L — ABNORMAL LOW (ref 135–145)
Total Bilirubin: 0.4 mg/dL (ref 0.3–1.2)
Total Protein: 6.2 g/dL — ABNORMAL LOW (ref 6.5–8.1)

## 2021-02-05 LAB — MAGNESIUM: Magnesium: 2 mg/dL (ref 1.7–2.4)

## 2021-02-05 MED ORDER — FUROSEMIDE 10 MG/ML IJ SOLN
40.0000 mg | Freq: Once | INTRAMUSCULAR | Status: AC
  Administered 2021-02-05: 40 mg via INTRAVENOUS
  Filled 2021-02-05: qty 4

## 2021-02-05 MED ORDER — FUROSEMIDE 10 MG/ML IJ SOLN
40.0000 mg | Freq: Every day | INTRAMUSCULAR | Status: DC
  Administered 2021-02-06: 40 mg via INTRAVENOUS
  Filled 2021-02-05: qty 4

## 2021-02-05 NOTE — TOC Progression Note (Signed)
Transition of Care [facility]) - Progression Note    Patient Details  Name: Tommy Mathews MRN: [id number] Date of Birth: 10-04-1960  [facility] CM/SW Contact  Patrice [place], [place] Phone Number: [phone] 02/05/2021, 12:07 PM  Clinical Narrative:     CSW was able to get in touch with DON today and she was at the facility. She confirmed that pt was on 4-5 liters of O2 at the facility and did not keep it on. She is requesting that pt return when he is back on his baseline 02 since he is a full code. CSW alerted MD of this information.  TOC team will continue to assist with discharge planning needs. Expected Discharge Plan: Skilled Nursing Facility Barriers to Discharge: Barriers Resolved  Expected Discharge Plan and Services Expected Discharge Plan: Skilled Nursing Facility In-house Referral: Clinical Social Work     Living arrangements for the past 2 months: Skilled Nursing Facility Expected Discharge Date: 02/03/21                                     Social Determinants of Health (SDOH) Interventions    Readmission Risk Interventions No flowsheet data found.

## 2021-02-05 NOTE — Progress Notes (Signed)
Ordered new trach Shiley 6 uncuffed for trach replacement to ensure that a spare trach would be present at bedside.

## 2021-02-05 NOTE — Progress Notes (Signed)
Pt stated he has to use bathroom, when asked to use bedside commode pt refused. Pt was very upset and said he can go without o2. Pt had unhooked himself from monitor and trach collar and got out of bed by himself. Bed alarm went off, assisted pt to restroom. 8l, 40% o2 applied via ventri mask in treach collar. Pt maintained o2 sat 95-100%. Helped pt back in bed, 02 applied via trach collar. Bed alarm on. Call bell within reach.  Will continue to monitor.

## 2021-02-05 NOTE — Progress Notes (Signed)
It has been documented that pt has a Shiley #6 cuffless but it appears if you look closely that the cuff inflater has been cut off and this is more than likely a cuffed trach vs cuffless. MD has given verbal order to change out trach to a Shiley #6 cuffless to resolve this issue.

## 2021-02-05 NOTE — Procedures (Addendum)
Tracheostomy Change Note  Patient Details:   Name: Tommy Mathews DOB: 29-May-1961 MRN: [id number]    Airway Documentation:     02/05/21 1438  Tracheostomy Shiley Flexible 6 mm Uncuffed  Placement Date/Time: 02/05/21 1437   Placed By: Self  Brand: Shiley Flexible  Size (mm): 6 mm  Style: Uncuffed  Status Secured;Passy Muir Speaking valve  Site Assessment Clean;Dry  Site Care Dressing applied  Ties Assessment Clean;Dry;Secure  Trach Changed Yes  Tracheostomy Equipment at bedside Yes and checklist posted at head of bed    Evaluation  O2 sats: stable throughout Complications: No apparent complications Patient did tolerate procedure well. Bilateral Breath Sounds: Tommy Mathews 02/05/2021, 2:39 PM

## 2021-02-05 NOTE — Progress Notes (Signed)
PROGRESS NOTE    Zorita PangJames Daddona  WUJ:[id number] DOB: 1961/05/12 DOA: 01/31/2021 PCP: Georgann HousekeeperHusain, Karrar, MD  Chief Complaint  Patient presents with  . Psychiatric Evaluation   Brief Narrative:  Fayrene FearingJames Lindseyis Ewa Hipp 60 y.o.malewith medical history significant ofHTN, DM type II, systolic CHF, paroxysmal atrial fibrillation not on anticoagulation2/2history of GI bleeds, COPD,TBIs/ptracheostomy, bipolar disorder, schizophrenia, and GERD presenting after patient reported to have been harassing staff memberand thephysician in charge recommended ER visit for psychiatric evaluation. Patient only notes that he had gotten some kind of disagreement with one of the staff members there. He states that over the last couple days he has had productive cough with greenish sputum production and has been feeling short of breath. He denies having any significant fever, nausea, vomiting, chest pain, change in vision, or diarrhea symptoms. [facility] it appears patient had been started on Augmentin 500-125 mg to complete Ewa Hipp 7-day course as well as prednisone.In the emergency department, patient was noted to have oxygen saturation as low as 77%, with improvement after suctioning and his chronic trach with increase in supplemental oxygen use. Chest x-ray with suspicious for mild infiltrates as well as superimposed interstitial edema. He was given IV Lasix, continued on Augmentin and breathing treatments.  Patient to complete Saverio Kader total of 7 days of Augmentin and then stop it.  Patient had echocardiogram which did not show decreased EF.  Patient has history of bipolar disorder andschizophrenia,psych was consulted and they were recommending outpatient referral for cognitive behavioral therapy. He will continue his psych meds and does not need any inpatient psych admission.  Patient with diabetes mellitus type 2, A1c of 7 and he will continue with his current regimen.  Had planned on  discharge, but he continues to require greater than his home oxygen.  Will continue diuresis and antibiotics.  Discharge when oxygen improves to baseline.   Assessment & Plan:   Principal Problem:   Acute on chronic respiratory failure with hypoxia (HCC) Active Problems:   Schizophrenia (HCC)   Bipolar I disorder, most recent episode depressed (HCC)   Acute on chronic diastolic CHF (congestive heart failure) (HCC)   COPD with acute bronchitis (HCC)   Polycythemia   Renal insufficiency  Acute on chronic hypoxemic respiratory failure with chronic trach  HFpEF Exacerbation  Concern for CAP -Secondary to acute bronchitis -on 7 -8 L trach collar this AM, weaned to 6 L and maintaining sats in 90's (will continue to monitor, desatted after weaning yesterday) -Chest x-ray showed interstitial lung markings with mild infiltrates with mild amount of superimposed interstitial edema CXR 5/7 with moderate bilateral interstitial and airspace opacities, small bilateral effusions -BNP164.1 -Procalcitonin negative -echo showed preserved EF - continue lasix (transition to PO when back to baseline)  -Continue Augmentin (complete 7 day course), breathing treatments, steroids completed (no wheezing, can be d/c'd)  Polycythemia -Peripheral smear,EPO is elevated -> ? Secondary to underlying respiratory issues  -needs continued follow up outpatient, consider hematology follow up outpatient  History of TBI status post tracheostomy -Trach care and suctioning as needed  Paroxysmal atrial fibrillation -CHA2DS2-VASc score 4, not on anticoagulation due to history of GI bleeding -Continue aspirin, digoxin, metoprolol -In normal sinus rhythm this morning  HTN -Continue lisinopril, Toprol  History of bipolar disorder, schizophrenia -Continue Seroquel, Klonopin, Depakote, Prozac -Psychiatry consulted,recommended for outpatient referral for cognitive behavioral therapy. No recommendations to adjust  his medications at this point. Does not need inpatient psych admission.  Diabetes mellitus type 2 -Hemoglobin  A1c 7.0 -Continue sliding scale insulin   DVT prophylaxis: lovenox Code Status: full  Family Communication: lovenox Disposition:   Status is: Inpatient  Remains inpatient appropriate because:Inpatient level of care appropriate due to severity of illness   Dispo: The patient is from: Home              Anticipated d/c is to: Home              Patient currently is not medically stable to d/c.   Difficult to place patient No       Consultants:   none  Procedures:  Echo IMPRESSIONS    1. Left ventricular ejection fraction, by estimation, is 55%. The left  ventricle has normal function. The left ventricle has no regional wall  motion abnormalities. Left ventricular diastolic parameters were normal.  2. Right ventricular systolic function is normal. The right ventricular  size is normal.  3. Left atrial size was mildly dilated.  4. The pericardial effusion is posterior to the left ventricle.  5. The mitral valve is normal in structure. No evidence of mitral valve  regurgitation. No evidence of mitral stenosis.  6. The aortic valve is tricuspid. There is mild calcification of the  aortic valve. Aortic valve regurgitation is not visualized. Mild aortic  valve sclerosis is present, with no evidence of aortic valve stenosis.  7. The inferior vena cava is normal in size with greater than 50%  respiratory variability, suggesting right atrial pressure of 3 mmHg.   Antimicrobials:  Anti-infectives (From admission, onward)   Start     Dose/Rate Route Frequency Ordered Stop   02/01/21 1400  amoxicillin-clavulanate (AUGMENTIN) 500-125 MG per tablet 500 mg        1 tablet Oral Every 8 hours 02/01/21 1240           Subjective: No new complaints Asking when he'll go home  Objective: Vitals:   02/05/21 0855 02/05/21 0900 02/05/21 1001 02/05/21 1100  BP:     (!) 161/102  Pulse:  (!) 52 (!) 58 66  Resp:  16  20  Temp:    98 F (36.7 C)  TempSrc:    Oral  SpO2: 92% 93%  95%  Weight:      Height:        Intake/Output Summary (Last 24 hours) at 02/05/2021 1400 Last data filed at 02/05/2021 1117 Gross per 24 hour  Intake 240 ml  Output 3225 ml  Net -2985 ml   Filed Weights   02/03/21 0342 02/04/21 0330 02/05/21 0651  Weight: 102.7 kg 100.7 kg 100 kg    Examination:  General exam: Appears calm and comfortable  Respiratory system: Clear to auscultation. Respiratory effort normal. Cardiovascular system: S1 & S2 heard, RRR.  Gastrointestinal system: Abdomen is nondistended, soft and nontender. Central nervous system: Alert and oriented. No focal neurological deficits. Extremities: no LEE Skin: No rashes, lesions or ulcers Psychiatry: Judgement and insight appear normal. Mood & affect appropriate.     Data Reviewed: I have personally reviewed following labs and imaging studies  CBC: Recent Labs  Lab 01/31/21 2235 02/01/21 1117 02/02/21 0113 02/03/21 0203 02/04/21 0803 02/05/21 0058  WBC 9.4 10.2 8.9 9.3 8.9 9.9  NEUTROABS 6.6 7.0  --   --  5.0 6.7  HGB 19.0* 18.8* 19.0* 18.4* 18.4* 18.4*  HCT 63.1* 64.0* 64.2* 62.3* 62.5* 61.5*  MCV 85.0 85.1 84.6 84.2 84.7 84.0  PLT 167 169 176 174 155 171    Basic Metabolic  Panel: Recent Labs  Lab 02/01/21 1117 02/02/21 0113 02/03/21 0203 02/04/21 0803 02/05/21 0058  NA 137 137 134* 136 134*  K 4.8 4.0 4.7 4.7 4.4  CL 99 97* 97* 101 97*  CO2 32 31 29 30 31   GLUCOSE 110* 130* 221* 97 181*  BUN 17 20 19 14 20   CREATININE 1.11 1.16 1.13 0.99 1.08  CALCIUM 8.5* 8.3* 8.7* 8.4* 8.8*  MG  --   --   --   --  2.0  PHOS  --   --   --   --  3.2    GFR: Estimated Creatinine Clearance: 91.3 mL/min (by C-G formula based on SCr of 1.08 mg/dL).  Liver Function Tests: Recent Labs  Lab 01/31/21 2235 02/04/21 0803 02/05/21 0058  AST 22 15 18   ALT 12 11 11   ALKPHOS 58 49 52   BILITOT 0.7 0.7 0.4  PROT 7.0 5.8* 6.2*  ALBUMIN 3.1* 2.6* 2.7*    CBG: Recent Labs  Lab 02/04/21 0612 02/04/21 1144 02/04/21 1622 02/04/21 2123 02/05/21 0642  GLUCAP 113* 204* 161* 192* 139*     Recent Results (from the past 240 hour(s))  Resp Panel by RT-PCR (Flu Hanley Rispoli&B, Covid) Nasopharyngeal Swab     Status: None   Collection Time: 02/01/21 12:29 AM   Specimen: Nasopharyngeal Swab; Nasopharyngeal(NP) swabs in vial transport medium  Result Value Ref Range Status   SARS Coronavirus 2 by RT PCR NEGATIVE NEGATIVE Final    Comment: (NOTE) SARS-CoV-2 target nucleic acids are NOT DETECTED.  The SARS-CoV-2 RNA is generally detectable in upper respiratory specimens during the acute phase of infection. The lowest concentration of SARS-CoV-2 viral copies this assay can detect is 138 copies/mL. Wilferd Ritson negative result does not preclude SARS-Cov-2 infection and should not be used as the sole basis for treatment or other patient management decisions. Torian Thoennes negative result may occur with  improper specimen collection/handling, submission of specimen other than nasopharyngeal swab, presence of viral mutation(s) within the areas targeted by this assay, and inadequate number of viral copies(<138 copies/mL). Shenika Quint negative result must be combined with clinical observations, patient history, and epidemiological information. The expected result is Negative.  Fact Sheet for Patients:  04/06/21  Fact Sheet for Healthcare Providers:  2124  This test is no t yet approved or cleared by the 04/07/21 FDA and  has been authorized for detection and/or diagnosis of SARS-CoV-2 by FDA under an Emergency Use Authorization (EUA). This EUA will remain  in effect (meaning this test can be used) for the duration of the COVID-19 declaration under Section 564(b)(1) of the Act, 21 U.S.C.section 360bbb-3(b)(1), unless the authorization is  terminated  or revoked sooner.       Influenza Dorna Mallet by PCR NEGATIVE NEGATIVE Final   Influenza B by PCR NEGATIVE NEGATIVE Final    Comment: (NOTE) The Xpert Xpress SARS-CoV-2/FLU/RSV plus assay is intended as an aid in the diagnosis of influenza from Nasopharyngeal swab specimens and should not be used as Rosalena Mccorry sole basis for treatment. Nasal washings and aspirates are unacceptable for Xpert Xpress SARS-CoV-2/FLU/RSV testing.  Fact Sheet for Patients: 04/03/21  Fact Sheet for Healthcare Providers: BloggerCourse.com  This test is not yet approved or cleared by the SeriousBroker.it FDA and has been authorized for detection and/or diagnosis of SARS-CoV-2 by FDA under an Emergency Use Authorization (EUA). This EUA will remain in effect (meaning this test can be used) for the duration of the COVID-19 declaration under Section 564(b)(1) of the Act, 21  U.S.C. section 360bbb-3(b)(1), unless the authorization is terminated or revoked.  Performed at [facility], [address], [place], [place] [address]   SARS CORONAVIRUS 2 (TAT 6-24 HRS) Nasopharyngeal Nasopharyngeal Swab     Status: None   Collection Time: 02/03/21 11:20 AM   Specimen: Nasopharyngeal Swab  Result Value Ref Range Status   SARS Coronavirus 2 NEGATIVE NEGATIVE Final    Comment: (NOTE) SARS-CoV-2 target nucleic acids are NOT DETECTED.  The SARS-CoV-2 RNA is generally detectable in upper and lower respiratory specimens during the acute phase of infection. Negative results do not preclude SARS-CoV-2 infection, do not rule out co-infections with other pathogens, and should not be used as the sole basis for treatment or other patient management decisions. Negative results must be combined with clinical observations, patient history, and epidemiological information. The expected result is Negative.  Fact Sheet for  Patients: HairSlick.no  Fact Sheet for Healthcare Providers: quierodirigir.com  This test is not yet approved or cleared by the [place] FDA and  has been authorized for detection and/or diagnosis of SARS-CoV-2 by FDA under an Emergency Use Authorization (EUA). This EUA will remain  in effect (meaning this test can be used) for the duration of the COVID-19 declaration under Se ction 564(b)(1) of the Act, 21 U.S.C. section 360bbb-3(b)(1), unless the authorization is terminated or revoked sooner.  Performed at [facility], [address] [place], [place] [address]          Radiology Studies: DG CHEST PORT 1 VIEW  Result Date: 02/04/2021 CLINICAL DATA:  Hypoxia. EXAM: PORTABLE CHEST 1 VIEW COMPARISON:  Chest radiograph dated 01/31/2021. FINDINGS: The heart remains enlarged. Arloa Prak tracheostomy tube terminates in the upper thoracic trachea. Moderate bilateral interstitial and airspace opacities appear increased since 01/31/2021. Small bilateral pleural effusions likely contribute. There is no pneumothorax Degenerative changes are seen in the spine. IMPRESSION: Moderate bilateral interstitial and airspace opacities. Small bilateral pleural effusions likely contribute. Electronically Signed   By: Romona Curls M.D.   On: 02/04/2021 14:00        Scheduled Meds: . amoxicillin-clavulanate  1 tablet Oral Q8H  . aspirin EC  81 mg Oral Daily  . budesonide  0.5 mg Nebulization BID  . clobetasol cream  1 application Topical BID  . clonazePAM  0.25 mg Oral BID  . digoxin  0.25 mg Oral Daily  . divalproex  500 mg Oral BID  . docusate sodium  100 mg Oral BID  . enoxaparin (LOVENOX) injection  40 mg Subcutaneous Q24H  . FLUoxetine  20 mg Oral Daily  . furosemide  40 mg Oral BID  . gabapentin  200 mg Oral Q12H  . guaiFENesin  600 mg Oral BID  . insulin aspart  0-9 Units Subcutaneous TID WC  . lisinopril  10 mg Oral Daily  .  magnesium oxide  400 mg Oral BID  . metoprolol succinate  25 mg Oral Daily  . pantoprazole sodium  40 mg Oral Daily  . predniSONE  40 mg Oral Q breakfast  . QUEtiapine  200 mg Oral QHS  . saccharomyces boulardii  250 mg Oral BID  . sodium chloride flush  3 mL Intravenous Q12H   Continuous Infusions: . sodium chloride       LOS: 3 days    Time spent: over 30 min    Lacretia Nicks, MD [facility]   To contact the attending provider between 7A-7P or the covering provider during after hours 7P-7A, please log  into the web site [email or web address] and access using universal [place] password for that web site. If you do not have the password, please call the hospital operator.  02/05/2021, 2:00 PM

## 2021-02-06 DIAGNOSIS — J9621 Acute and chronic respiratory failure with hypoxia: Secondary | ICD-10-CM | POA: Diagnosis not present

## 2021-02-06 LAB — CBC WITH DIFFERENTIAL/PLATELET
Abs Immature Granulocytes: 0.05 10*3/uL (ref 0.00–0.07)
Basophils Absolute: 0 10*3/uL (ref 0.0–0.1)
Basophils Relative: 0 %
Eosinophils Absolute: 0.1 10*3/uL (ref 0.0–0.5)
Eosinophils Relative: 0 %
HCT: 63 % — ABNORMAL HIGH (ref 39.0–52.0)
Hemoglobin: 18.7 g/dL — ABNORMAL HIGH (ref 13.0–17.0)
Immature Granulocytes: 0 %
Lymphocytes Relative: 23 %
Lymphs Abs: 2.6 10*3/uL (ref 0.7–4.0)
MCH: 25 pg — ABNORMAL LOW (ref 26.0–34.0)
MCHC: 29.7 g/dL — ABNORMAL LOW (ref 30.0–36.0)
MCV: 84.1 fL (ref 80.0–100.0)
Monocytes Absolute: 1.1 10*3/uL — ABNORMAL HIGH (ref 0.1–1.0)
Monocytes Relative: 10 %
Neutro Abs: 7.5 10*3/uL (ref 1.7–7.7)
Neutrophils Relative %: 67 %
Platelets: 170 10*3/uL (ref 150–400)
RBC: 7.49 MIL/uL — ABNORMAL HIGH (ref 4.22–5.81)
RDW: 20.3 % — ABNORMAL HIGH (ref 11.5–15.5)
WBC: 11.4 10*3/uL — ABNORMAL HIGH (ref 4.0–10.5)
nRBC: 0 % (ref 0.0–0.2)

## 2021-02-06 LAB — COMPREHENSIVE METABOLIC PANEL
ALT: 14 U/L (ref 0–44)
AST: 24 U/L (ref 15–41)
Albumin: 2.6 g/dL — ABNORMAL LOW (ref 3.5–5.0)
Alkaline Phosphatase: 53 U/L (ref 38–126)
Anion gap: 9 (ref 5–15)
BUN: 22 mg/dL — ABNORMAL HIGH (ref 6–20)
CO2: 32 mmol/L (ref 22–32)
Calcium: 8.7 mg/dL — ABNORMAL LOW (ref 8.9–10.3)
Chloride: 95 mmol/L — ABNORMAL LOW (ref 98–111)
Creatinine, Ser: 1.1 mg/dL (ref 0.61–1.24)
GFR, Estimated: >60 mL/min (ref >60)
Glucose, Bld: 189 mg/dL — ABNORMAL HIGH (ref 70–99)
Potassium: 4.1 mmol/L (ref 3.5–5.1)
Sodium: 136 mmol/L (ref 135–145)
Total Bilirubin: 0.8 mg/dL (ref 0.3–1.2)
Total Protein: 5.8 g/dL — ABNORMAL LOW (ref 6.5–8.1)

## 2021-02-06 LAB — MAGNESIUM: Magnesium: 2.1 mg/dL (ref 1.7–2.4)

## 2021-02-06 LAB — BRAIN NATRIURETIC PEPTIDE: B Natriuretic Peptide: 28.6 pg/mL (ref 0.0–100.0)

## 2021-02-06 LAB — GLUCOSE, CAPILLARY
Glucose-Capillary: 237 mg/dL — ABNORMAL HIGH (ref 70–99)
Glucose-Capillary: 274 mg/dL — ABNORMAL HIGH (ref 70–99)
Glucose-Capillary: 233 mg/dL — ABNORMAL HIGH (ref 70–99)
Glucose-Capillary: 189 mg/dL — ABNORMAL HIGH (ref 70–99)

## 2021-02-06 LAB — PHOSPHORUS: Phosphorus: 3.4 mg/dL (ref 2.5–4.6)

## 2021-02-06 MED ORDER — FUROSEMIDE 10 MG/ML IJ SOLN
40.0000 mg | Freq: Two times a day (BID) | INTRAMUSCULAR | Status: DC
  Administered 2021-02-06 – 2021-02-10 (×9): 40 mg via INTRAVENOUS
  Filled 2021-02-06 (×11): qty 4

## 2021-02-06 NOTE — Progress Notes (Signed)
Mobility Specialist - Progress Note   02/06/21 1141  Mobility  Activity Stood at bedside  Level of Assistance Contact guard assist, steadying assist  Assistive Device Front wheel walker  Distance Ambulated (ft) 4 ft  Mobility Response Tolerated well  Mobility performed by Mobility specialist  $Mobility charge 1 Mobility   Pre-mobility, 8L O2: 77 HR, 94% SpO2 During mobility:       8L O2: 86% SpO2      10L O2: 90% SpO2 Post-mobility, 8L O2: 66 HR, 93% SpO2  Pt asx throughout ambuation. He was able to stand from bed and sidestep at bedside w/ min guard. RN changed linens and NT bathed pt while he was standing. Pt laying in bed w/o assist, bed alarm on and RT in room.   Tommy Mathews Mobility Specialist Mobility Specialist Phone: [phone]

## 2021-02-06 NOTE — Progress Notes (Signed)
PROGRESS NOTE    Tommy Mathews  HKV:[id number] DOB: Jun 08, 1961 DOA: 01/31/2021 PCP: Georgann Housekeeper, MD  Chief Complaint  Patient presents with  . Psychiatric Evaluation   Brief Narrative:  Tommy Tommy Mathews 60 y.o.malewith medical history significant ofHTN, DM type II, systolic CHF, paroxysmal atrial fibrillation not on anticoagulation2/2history of GI bleeds, COPD,TBIs/ptracheostomy, bipolar disorder, schizophrenia, and GERD presenting after patient reported to have been harassing staff memberand thephysician in charge recommended ER visit for psychiatric evaluation. Patient only notes that he had gotten some kind of disagreement with one of the staff members there. He states that over the last couple days he has had productive cough with greenish sputum production and has been feeling short of breath. He denies having any significant fever, nausea, vomiting, chest pain, change in vision, or diarrhea symptoms. After talking to staff at [facility] it appears patient had been started on Augmentin 500-125 mg to complete Karon Cotterill 7-day course as well as prednisone.In the emergency department, patient was noted to have oxygen saturation as low as 77%, with improvement after suctioning and his chronic trach with increase in supplemental oxygen use. Chest x-ray with suspicious for mild infiltrates as well as superimposed interstitial edema. He was given IV Lasix, continued on Augmentin and breathing treatments.  Patient to complete Kris Burd total of 7 days of Augmentin and then stop it.  Patient had echocardiogram which did not show decreased EF.  Patient has history of bipolar disorder andschizophrenia,psych was consulted and they were recommending outpatient referral for cognitive behavioral therapy. He will continue his psych meds and does not need any inpatient psych admission.  Patient with diabetes mellitus type 2, A1c of 7 and he will continue with his current regimen.  Had planned on  discharge, but he continues to require greater than his home oxygen.  Will continue diuresis and antibiotics.  Discharge when oxygen improves to baseline.  Assessment & Plan:   Principal Problem:   Acute on chronic respiratory failure with hypoxia (HCC) Active Problems:   Schizophrenia (HCC)   Bipolar I disorder, most recent episode depressed (HCC)   Acute on chronic diastolic CHF (congestive heart failure) (HCC)   COPD with acute bronchitis (HCC)   Polycythemia   Renal insufficiency  Acute on chronic hypoxemic respiratory failure with chronic trach  HFpEF Exacerbation  Concern for CAP -Secondary to acute bronchitis -on 7-8 L trach collar this AM again (desatted on 8 L to 86% with mobility, requiring 10 L with mobility to maintain 90%) - at baseline on 28% FiO2 (5 L trach collar)  -Chest x-ray showed interstitial lung markings with mild infiltrates with mild amount of superimposed interstitial edema - CXR 5/7 with moderate bilateral interstitial and airspace opacities, small bilateral effusions - BNP164.1 - Procalcitonin negative - echo showed preserved EF - continue lasix (transition to PO when back to baseline)  -Continue Augmentin (complete 7 day course), breathing treatments, steroids completed (no wheezing, can be d/c'd) - if not improving, consider CT PE protocol  Polycythemia -Peripheral smear,EPO is elevated -> ? Secondary to underlying respiratory issues  -needs continued follow up outpatient, consider hematology follow up outpatient  History of TBI status post tracheostomy -Trach care and suctioning as needed  Paroxysmal atrial fibrillation -CHA2DS2-VASc score 4, not on anticoagulation due to history of GI bleeding -Continue aspirin, digoxin, metoprolol -In normal sinus rhythm this morning  HTN -Continue lisinopril, Toprol  History of bipolar disorder, schizophrenia -Continue Seroquel, Klonopin, Depakote, Prozac -Psychiatry consulted,recommended for  outpatient referral for cognitive behavioral therapy.  No recommendations to adjust his medications at this point. Does not need inpatient psych admission.  Diabetes mellitus type 2 -Hemoglobin A1c 7.0 -Continue sliding scale insulin   DVT prophylaxis: lovenox Code Status: full  Family Communication: lovenox Disposition:   Status is: Inpatient  Remains inpatient appropriate because:Inpatient level of care appropriate due to severity of illness   Dispo: The patient is from: Home              Anticipated d/c is to: Home              Patient currently is not medically stable to d/c.   Difficult to place patient No       Consultants:   none  Procedures:  Echo IMPRESSIONS    1. Left ventricular ejection fraction, by estimation, is 55%. The left  ventricle has normal function. The left ventricle has no regional wall  motion abnormalities. Left ventricular diastolic parameters were normal.  2. Right ventricular systolic function is normal. The right ventricular  size is normal.  3. Left atrial size was mildly dilated.  4. The pericardial effusion is posterior to the left ventricle.  5. The mitral valve is normal in structure. No evidence of mitral valve  regurgitation. No evidence of mitral stenosis.  6. The aortic valve is tricuspid. There is mild calcification of the  aortic valve. Aortic valve regurgitation is not visualized. Mild aortic  valve sclerosis is present, with no evidence of aortic valve stenosis.  7. The inferior vena cava is normal in size with greater than 50%  respiratory variability, suggesting right atrial pressure of 3 mmHg.   Antimicrobials:  Anti-infectives (From admission, onward)   Start     Dose/Rate Route Frequency Ordered Stop   02/01/21 1400  amoxicillin-clavulanate (AUGMENTIN) 500-125 MG per tablet 500 mg        1 tablet Oral Every 8 hours 02/01/21 1240 02/08/21 1359         Subjective: No complaints, eating hamburger - says  it's "ok"  Objective: Vitals:   02/06/21 0745 02/06/21 0824 02/06/21 1119 02/06/21 1137  BP: 140/87 140/87 130/81 130/81  Pulse: 72 76 65 64  Resp: 17 16 16 16   Temp: 98.2 F (36.8 C)  98 F (36.7 C)   TempSrc: Oral  Oral   SpO2: 90% 96% 90% 97%  Weight:      Height:        Intake/Output Summary (Last 24 hours) at 02/06/2021 1426 Last data filed at 02/06/2021 1410 Gross per 24 hour  Intake 720 ml  Output 2575 ml  Net -1855 ml   Filed Weights   02/04/21 0330 02/05/21 0651 02/06/21 0413  Weight: 100.7 kg 100 kg 99.7 kg    Examination:  General: No acute distress. Cardiovascular: Heart sounds show Jossilyn Benda regular rate, and rhythm Lungs: Clear to auscultation bilaterally - trach in place, transmitted upper airway sounds Abdomen: Soft, nontender, nondistended Neurological: Alert and oriented 3. Moves all extremities 4 . Cranial nerves II through XII grossly intact. Skin: Warm and dry. No rashes or lesions. Extremities: No clubbing or cyanosis. No edema.   Data Reviewed: I have personally reviewed following labs and imaging studies  CBC: Recent Labs  Lab 01/31/21 2235 02/01/21 1117 02/02/21 0113 02/03/21 0203 02/04/21 0803 02/05/21 0058 02/06/21 0437  WBC 9.4 10.2 8.9 9.3 8.9 9.9 11.4*  NEUTROABS 6.6 7.0  --   --  5.0 6.7 7.5  HGB 19.0* 18.8* 19.0* 18.4* 18.4* 18.4* 18.7*  HCT 63.1*  64.0* 64.2* 62.3* 62.5* 61.5* 63.0*  MCV 85.0 85.1 84.6 84.2 84.7 84.0 84.1  PLT 167 169 176 174 155 171 170    Basic Metabolic Panel: Recent Labs  Lab 02/02/21 0113 02/03/21 0203 02/04/21 0803 02/05/21 0058 02/06/21 0728  NA 137 134* 136 134* 136  K 4.0 4.7 4.7 4.4 4.1  CL 97* 97* 101 97* 95*  CO2 31 29 30 31  32  GLUCOSE 130* 221* 97 181* 189*  BUN 20 19 14 20  22*  CREATININE 1.16 1.13 0.99 1.08 1.10  CALCIUM 8.3* 8.7* 8.4* 8.8* 8.7*  MG  --   --   --  2.0 2.1  PHOS  --   --   --  3.2 3.4    GFR: Estimated Creatinine Clearance: 89.5 mL/min (by C-G formula based on SCr of  1.1 mg/dL).  Liver Function Tests: Recent Labs  Lab 01/31/21 2235 02/04/21 0803 02/05/21 0058 02/06/21 0728  AST 22 15 18 24   ALT 12 11 11 14   ALKPHOS 58 49 52 53  BILITOT 0.7 0.7 0.4 0.8  PROT 7.0 5.8* 6.2* 5.8*  ALBUMIN 3.1* 2.6* 2.7* 2.6*    CBG: Recent Labs  Lab 02/05/21 1115 02/05/21 1722 02/05/21 2116 02/06/21 0603 02/06/21 1123  GLUCAP 249* 201* 267* 237* 274*     Recent Results (from the past 240 hour(s))  Resp Panel by RT-PCR (Flu Meital Riehl&B, Covid) Nasopharyngeal Swab     Status: None   Collection Time: 02/01/21 12:29 AM   Specimen: Nasopharyngeal Swab; Nasopharyngeal(NP) swabs in vial transport medium  Result Value Ref Range Status   SARS Coronavirus 2 by RT PCR NEGATIVE NEGATIVE Final    Comment: (NOTE) SARS-CoV-2 target nucleic acids are NOT DETECTED.  The SARS-CoV-2 RNA is generally detectable in upper respiratory specimens during the acute phase of infection. The lowest concentration of SARS-CoV-2 viral copies this assay can detect is 138 copies/mL. Prachi Oftedahl negative result does not preclude SARS-Cov-2 infection and should not be used as the sole basis for treatment or other patient management decisions. Lafreda Casebeer negative result may occur with  improper specimen collection/handling, submission of specimen other than nasopharyngeal swab, presence of viral mutation(s) within the areas targeted by this assay, and inadequate number of viral copies(<138 copies/mL). Francesco Provencal negative result must be combined with clinical observations, patient history, and epidemiological information. The expected result is Negative.  Fact Sheet for Patients:  2117  Fact Sheet for Healthcare Providers:  04/08/21  This test is no t yet approved or cleared by the 04/08/21 FDA and  has been authorized for detection and/or diagnosis of SARS-CoV-2 by FDA under an Emergency Use Authorization (EUA). This EUA will remain  in  effect (meaning this test can be used) for the duration of the COVID-19 declaration under Section 564(b)(1) of the Act, 21 U.S.C.section 360bbb-3(b)(1), unless the authorization is terminated  or revoked sooner.       Influenza Kailie Polus by PCR NEGATIVE NEGATIVE Final   Influenza B by PCR NEGATIVE NEGATIVE Final    Comment: (NOTE) The Xpert Xpress SARS-CoV-2/FLU/RSV plus assay is intended as an aid in the diagnosis of influenza from Nasopharyngeal swab specimens and should not be used as Khyri Hinzman sole basis for treatment. Nasal washings and aspirates are unacceptable for Xpert Xpress SARS-CoV-2/FLU/RSV testing.  Fact Sheet for Patients: 04/03/21  Fact Sheet for Healthcare Providers: BloggerCourse.com  This test is not yet approved or cleared by the [employer]iousBroker.it FDA and has been authorized for detection and/or diagnosis of SARS-CoV-2 by  FDA under an Emergency Use Authorization (EUA). This EUA will remain in effect (meaning this test can be used) for the duration of the COVID-19 declaration under Section 564(b)(1) of the Act, 21 U.S.C. section 360bbb-3(b)(1), unless the authorization is terminated or revoked.  Performed at [facility], [address], [place], [place] [address]   SARS CORONAVIRUS 2 (TAT 6-24 HRS) Nasopharyngeal Nasopharyngeal Swab     Status: None   Collection Time: 02/03/21 11:20 AM   Specimen: Nasopharyngeal Swab  Result Value Ref Range Status   SARS Coronavirus 2 NEGATIVE NEGATIVE Final    Comment: (NOTE) SARS-CoV-2 target nucleic acids are NOT DETECTED.  The SARS-CoV-2 RNA is generally detectable in upper and lower respiratory specimens during the acute phase of infection. Negative results do not preclude SARS-CoV-2 infection, do not rule out co-infections with other pathogens, and should not be used as the sole basis for treatment or other patient management decisions. Negative results must be combined  with clinical observations, patient history, and epidemiological information. The expected result is Negative.  Fact Sheet for Patients: HairSlick.no  Fact Sheet for Healthcare Providers: quierodirigir.com  This test is not yet approved or cleared by the [place] FDA and  has been authorized for detection and/or diagnosis of SARS-CoV-2 by FDA under an Emergency Use Authorization (EUA). This EUA will remain  in effect (meaning this test can be used) for the duration of the COVID-19 declaration under Se ction 564(b)(1) of the Act, 21 U.S.C. section 360bbb-3(b)(1), unless the authorization is terminated or revoked sooner.  Performed at [facility], [address], [place], [place] [address]          Radiology Studies: No results found.      Scheduled Meds: . amoxicillin-clavulanate  1 tablet Oral Q8H  . aspirin EC  81 mg Oral Daily  . budesonide  0.5 mg Nebulization BID  . clobetasol cream  1 application Topical BID  . clonazePAM  0.25 mg Oral BID  . digoxin  0.25 mg Oral Daily  . divalproex  500 mg Oral BID  . docusate sodium  100 mg Oral BID  . enoxaparin (LOVENOX) injection  40 mg Subcutaneous Q24H  . FLUoxetine  20 mg Oral Daily  . furosemide  40 mg Intravenous BID  . gabapentin  200 mg Oral Q12H  . guaiFENesin  600 mg Oral BID  . insulin aspart  0-9 Units Subcutaneous TID WC  . lisinopril  10 mg Oral Daily  . magnesium oxide  400 mg Oral BID  . metoprolol succinate  25 mg Oral Daily  . pantoprazole sodium  40 mg Oral Daily  . predniSONE  40 mg Oral Q breakfast  . QUEtiapine  200 mg Oral QHS  . saccharomyces boulardii  250 mg Oral BID  . sodium chloride flush  3 mL Intravenous Q12H   Continuous Infusions: . sodium chloride       LOS: 4 days    Time spent: over 30 min    Lacretia Nicks, MD [facility]   To contact the attending provider between 7A-7P or the covering  provider during after hours 7P-7A, please log into the web site [email or web address] and access using universal [place] password for that web site. If you do not have the password, please call the hospital operator.  02/06/2021, 2:26 PM

## 2021-02-07 ENCOUNTER — Inpatient Hospital Stay (HOSPITAL_COMMUNITY): Payer: Medicaid Other

## 2021-02-07 DIAGNOSIS — J9621 Acute and chronic respiratory failure with hypoxia: Secondary | ICD-10-CM | POA: Diagnosis not present

## 2021-02-07 LAB — CBC WITH DIFFERENTIAL/PLATELET
Abs Immature Granulocytes: 0.04 10*3/uL (ref 0.00–0.07)
Basophils Absolute: 0 10*3/uL (ref 0.0–0.1)
Basophils Relative: 0 %
Eosinophils Absolute: 0.1 10*3/uL (ref 0.0–0.5)
Eosinophils Relative: 1 %
HCT: 63.9 % — ABNORMAL HIGH (ref 39.0–52.0)
Hemoglobin: 19.2 g/dL — ABNORMAL HIGH (ref 13.0–17.0)
Immature Granulocytes: 0 %
Lymphocytes Relative: 25 %
Lymphs Abs: 2.7 10*3/uL (ref 0.7–4.0)
MCH: 25.2 pg — ABNORMAL LOW (ref 26.0–34.0)
MCHC: 30 g/dL (ref 30.0–36.0)
MCV: 84 fL (ref 80.0–100.0)
Monocytes Absolute: 0.9 10*3/uL (ref 0.1–1.0)
Monocytes Relative: 8 %
Neutro Abs: 7 10*3/uL (ref 1.7–7.7)
Neutrophils Relative %: 66 %
Platelets: 158 10*3/uL (ref 150–400)
RBC: 7.61 MIL/uL — ABNORMAL HIGH (ref 4.22–5.81)
RDW: 20.4 % — ABNORMAL HIGH (ref 11.5–15.5)
WBC: 10.6 10*3/uL — ABNORMAL HIGH (ref 4.0–10.5)
nRBC: 0 % (ref 0.0–0.2)

## 2021-02-07 LAB — GLUCOSE, CAPILLARY
Glucose-Capillary: 222 mg/dL — ABNORMAL HIGH (ref 70–99)
Glucose-Capillary: 216 mg/dL — ABNORMAL HIGH (ref 70–99)
Glucose-Capillary: 297 mg/dL — ABNORMAL HIGH (ref 70–99)
Glucose-Capillary: 294 mg/dL — ABNORMAL HIGH (ref 70–99)

## 2021-02-07 LAB — COMPREHENSIVE METABOLIC PANEL
ALT: 17 U/L (ref 0–44)
AST: 26 U/L (ref 15–41)
Albumin: 2.7 g/dL — ABNORMAL LOW (ref 3.5–5.0)
Alkaline Phosphatase: 57 U/L (ref 38–126)
Anion gap: 9 (ref 5–15)
BUN: 27 mg/dL — ABNORMAL HIGH (ref 6–20)
CO2: 29 mmol/L (ref 22–32)
Calcium: 8.5 mg/dL — ABNORMAL LOW (ref 8.9–10.3)
Chloride: 95 mmol/L — ABNORMAL LOW (ref 98–111)
Creatinine, Ser: 1.15 mg/dL (ref 0.61–1.24)
GFR, Estimated: >60 mL/min (ref >60)
Glucose, Bld: 194 mg/dL — ABNORMAL HIGH (ref 70–99)
Potassium: 4.3 mmol/L (ref 3.5–5.1)
Sodium: 133 mmol/L — ABNORMAL LOW (ref 135–145)
Total Bilirubin: 0.6 mg/dL (ref 0.3–1.2)
Total Protein: 6.3 g/dL — ABNORMAL LOW (ref 6.5–8.1)

## 2021-02-07 LAB — PHOSPHORUS: Phosphorus: 2.8 mg/dL (ref 2.5–4.6)

## 2021-02-07 LAB — MAGNESIUM: Magnesium: 2.2 mg/dL (ref 1.7–2.4)

## 2021-02-07 NOTE — Progress Notes (Signed)
Mobility Specialist - Progress Note   02/07/21 1042  Mobility  Activity Stood at bedside  Level of Assistance Standby assist, set-up cues, supervision of patient - no hands on  Assistive Device Front wheel walker  Distance Ambulated (ft) 4 ft  Mobility Response Tolerated well  Mobility performed by Mobility specialist  $Mobility charge 1 Mobility   Pre-mobility, 7L O2: 65 HR, 93% SpO2 During mobility, 9L O2: 71 HR, BP, 87-90% SpO2 Post-mobility, 7L O2: 68 HR, 91% SpO2  Pt asx throughout ambulation. He marched in place and did stading calf raises at the bedside. Pt back in bed w/ bed alarm on.   Mamie Levers Mobility Specialist Mobility Specialist Phone: [phone]

## 2021-02-07 NOTE — Progress Notes (Addendum)
PROGRESS NOTE    Tommy Mathews  ZOX:[id number] DOB: Jan 21, 1961 DOA: 01/31/2021 PCP: Tommy HousekeeperHusain, Karrar, MD  Chief Complaint  Patient presents with  . Psychiatric Evaluation   Brief Narrative:  Tommy FearingJames Lindseyis Tommy Mathews 60 y.o.malewith medical history significant ofHTN, DM type II, systolic CHF, paroxysmal atrial fibrillation not on anticoagulation2/2history of GI bleeds, COPD,TBIs/ptracheostomy, bipolar disorder, schizophrenia, and GERD presenting after patient reported to have been harassing staff memberand thephysician in charge recommended ER visit for psychiatric evaluation. Patient only notes that he had gotten some kind of disagreement with one of the staff members there. He states that over the last couple days he has had productive cough with greenish sputum production and has been feeling short of breath. He denies having any significant fever, nausea, vomiting, chest pain, change in vision, or diarrhea symptoms. [facility] appears patient had been started on Augmentin 500-125 mg to complete Tommy Mathews 7-day course as well as prednisone.In the emergency department, patient was noted to have oxygen saturation as low as 77%, with improvement after suctioning and his chronic trach with increase in supplemental oxygen use. Chest x-ray with suspicious for mild infiltrates as well as superimposed interstitial edema. He was given IV Lasix, continued on Augmentin and breathing treatments.  Patient to complete Ginnie Marich total of 7 days of Augmentin and then stop it.  Patient had echocardiogram which did not show decreased EF.  Patient has history of bipolar disorder andschizophrenia,psych was consulted and they were recommending outpatient referral for cognitive behavioral therapy. He will continue his psych meds and does not need any inpatient psych admission.  Patient with diabetes mellitus type 2, A1c of 7 and he will continue with his current regimen.  Had planned on  discharge, but he continues to require greater than his home oxygen.  Will continue diuresis and antibiotics.  Discharge when oxygen improves to baseline (28% FiO2 requirement).  Assessment & Plan:   Principal Problem:   Acute on chronic respiratory failure with hypoxia (HCC) Active Problems:   Schizophrenia (HCC)   Bipolar I disorder, most recent episode depressed (HCC)   Acute on chronic diastolic CHF (congestive heart failure) (HCC)   COPD with acute bronchitis (HCC)   Polycythemia   Renal insufficiency  Acute on chronic hypoxemic respiratory failure with chronic trach  HFpEF Exacerbation  COPD  Concern for CAP -Secondary to acute bronchitis -on 7-8 L trach collar this AM again (desatted on 8 L to 86% with mobility, requiring 10 L with mobility to maintain 90% on 5/8) - at baseline on 28% FiO2 (5 L trach collar) per [facility] staff (they told me typically on 28% FiO2, but takes oxygen off to smoke frequently during day as well) -Chest x-ray showed interstitial lung markings with mild infiltrates with mild amount of superimposed interstitial edema - CXR 5/7 with moderate bilateral interstitial and airspace opacities, small bilateral effusions - 5/10 showing improved interstitial and airspace opacities, persistent bilateral R>L trace effusions - BNP164.1 - Procalcitonin negative - echo showed preserved EF - continue IV lasix (transition to PO when back to baseline) - interstitial opacities improving with IV lasix  -Continue Augmentin (complete 7 day course), breathing treatments, steroids completed (no wheezing, can be d/c'd) - if not improving, consider CT PE protocol  Polycythemia -Peripheral smear,EPO is elevated -> ? Secondary to underlying respiratory issues  -needs continued follow up outpatient, consider hematology follow up outpatient  History of TBI status post tracheostomy -Trach care and suctioning as needed  Paroxysmal atrial  fibrillation -CHA2DS2-VASc score  4, not on anticoagulation due to history of GI bleeding -Continue aspirin, digoxin, metoprolol -In normal sinus rhythm this morning  HTN -Continue lisinopril, Toprol  History of bipolar disorder, schizophrenia -Continue Seroquel, Klonopin, Depakote, Prozac -Psychiatry consulted,recommended for outpatient referral for cognitive behavioral therapy. No recommendations to adjust his medications at this point. Does not need inpatient psych admission.  Diabetes mellitus type 2 -Hemoglobin A1c 7.0 -Continue sliding scale insulin   DVT prophylaxis: lovenox Code Status: full  Family Communication: lovenox Disposition:   Status is: Inpatient  Remains inpatient appropriate because:Inpatient level of care appropriate due to severity of illness   Dispo: The patient is from: Home              Anticipated d/c is to: Home              Patient currently is not medically stable to d/c.   Difficult to place patient No       Consultants:   none  Procedures:  Echo IMPRESSIONS    1. Left ventricular ejection fraction, by estimation, is 55%. The left  ventricle has normal function. The left ventricle has no regional wall  motion abnormalities. Left ventricular diastolic parameters were normal.  2. Right ventricular systolic function is normal. The right ventricular  size is normal.  3. Left atrial size was mildly dilated.  4. The pericardial effusion is posterior to the left ventricle.  5. The mitral valve is normal in structure. No evidence of mitral valve  regurgitation. No evidence of mitral stenosis.  6. The aortic valve is tricuspid. There is mild calcification of the  aortic valve. Aortic valve regurgitation is not visualized. Mild aortic  valve sclerosis is present, with no evidence of aortic valve stenosis.  7. The inferior vena cava is normal in size with greater than 50%  respiratory variability, suggesting right atrial pressure of 3 mmHg.   Antimicrobials:   Anti-infectives (From admission, onward)   Start     Dose/Rate Route Frequency Ordered Stop   02/01/21 1400  amoxicillin-clavulanate (AUGMENTIN) 500-125 MG per tablet 500 mg        1 tablet Oral Every 8 hours 02/01/21 1240 02/08/21 1359         Subjective: No complaints  Objective: Vitals:   02/07/21 0310 02/07/21 0346 02/07/21 0738 02/07/21 0914  BP:  137/85 129/77   Pulse: (!) 57 63 60 (!) 58  Resp: 16 16 15 15   Temp:  98.6 F (37 C) 97.7 F (36.5 C)   TempSrc:  Oral Oral   SpO2: 93% 92% 92% 91%  Weight:  98.8 kg    Height:        Intake/Output Summary (Last 24 hours) at 02/07/2021 0941 Last data filed at 02/07/2021 0930 Gross per 24 hour  Intake 480 ml  Output 2075 ml  Net -1595 ml   Filed Weights   02/05/21 0651 02/06/21 0413 02/07/21 0346  Weight: 100 kg 99.7 kg 98.8 kg    Examination:  General: No acute distress.  Eating apple sauce. Cardiovascular: Heart sounds show Lillyanne Bradburn regular rate, and rhythm. Lungs: transmitted upper airway sounds, trach  Abdomen: Soft, nontender, nondistended  Neurological: Alert and oriented 3. Moves all extremities 4 . Cranial nerves II through XII grossly intact. Skin: Warm and dry. No rashes or lesions. Extremities: No clubbing or cyanosis. No edema.   Data Reviewed: I have personally reviewed following labs and imaging studies  CBC: Recent Labs  Lab 02/01/21 1117 02/02/21  0113 02/03/21 0203 02/04/21 0803 02/05/21 0058 02/06/21 0437 02/07/21 0137  WBC 10.2   < > 9.3 8.9 9.9 11.4* 10.6*  NEUTROABS 7.0  --   --  5.0 6.7 7.5 7.0  HGB 18.8*   < > 18.4* 18.4* 18.4* 18.7* 19.2*  HCT 64.0*   < > 62.3* 62.5* 61.5* 63.0* 63.9*  MCV 85.1   < > 84.2 84.7 84.0 84.1 84.0  PLT 169   < > 174 155 171 170 158   < > = values in this interval not displayed.    Basic Metabolic Panel: Recent Labs  Lab 02/03/21 0203 02/04/21 0803 02/05/21 0058 02/06/21 0728 02/07/21 0137  NA 134* 136 134* 136 133*  K 4.7 4.7 4.4 4.1 4.3  CL  97* 101 97* 95* 95*  CO2 29 30 31  32 29  GLUCOSE 221* 97 181* 189* 194*  BUN 19 14 20  22* 27*  CREATININE 1.13 0.99 1.08 1.10 1.15  CALCIUM 8.7* 8.4* 8.8* 8.7* 8.5*  MG  --   --  2.0 2.1 2.2  PHOS  --   --  3.2 3.4 2.8    GFR: Estimated Creatinine Clearance: 85.3 mL/min (by C-G formula based on SCr of 1.15 mg/dL).  Liver Function Tests: Recent Labs  Lab 01/31/21 2235 02/04/21 0803 02/05/21 0058 02/06/21 0728 02/07/21 0137  AST 22 15 18 24 26   ALT 12 11 11 14 17   ALKPHOS 58 49 52 53 57  BILITOT 0.7 0.7 0.4 0.8 0.6  PROT 7.0 5.8* 6.2* 5.8* 6.3*  ALBUMIN 3.1* 2.6* 2.7* 2.6* 2.7*    CBG: Recent Labs  Lab 02/06/21 0603 02/06/21 1123 02/06/21 1651 02/06/21 2106 02/07/21 0605  GLUCAP 237* 274* 233* 189* 222*     Recent Results (from the past 240 hour(s))  Resp Panel by RT-PCR (Flu Shandrika Ambers&B, Covid) Nasopharyngeal Swab     Status: None   Collection Time: 02/01/21 12:29 AM   Specimen: Nasopharyngeal Swab; Nasopharyngeal(NP) swabs in vial transport medium  Result Value Ref Range Status   SARS Coronavirus 2 by RT PCR NEGATIVE NEGATIVE Final    Comment: (NOTE) SARS-CoV-2 target nucleic acids are NOT DETECTED.  The SARS-CoV-2 RNA is generally detectable in upper respiratory specimens during the acute phase of infection. The lowest concentration of SARS-CoV-2 viral copies this assay can detect is 138 copies/mL. Kwynn Schlotter negative result does not preclude SARS-Cov-2 infection and should not be used as the sole basis for treatment or other patient management decisions. Randye Treichler negative result may occur with  improper specimen collection/handling, submission of specimen other than nasopharyngeal swab, presence of viral mutation(s) within the areas targeted by this assay, and inadequate number of viral copies(<138 copies/mL). Adaeze Better negative result must be combined with clinical observations, patient history, and epidemiological information. The expected result is Negative.  Fact Sheet for  Patients:  04/08/21  Fact Sheet for Healthcare Providers:  2107  This test is no t yet approved or cleared by the 04/09/21 FDA and  has been authorized for detection and/or diagnosis of SARS-CoV-2 by FDA under an Emergency Use Authorization (EUA). This EUA will remain  in effect (meaning this test can be used) for the duration of the COVID-19 declaration under Section 564(b)(1) of the Act, 21 U.S.C.section 360bbb-3(b)(1), unless the authorization is terminated  or revoked sooner.       Influenza Espyn Radwan by PCR NEGATIVE NEGATIVE Final   Influenza B by PCR NEGATIVE NEGATIVE Final    Comment: (NOTE) The Xpert Xpress SARS-CoV-2/FLU/RSV plus  assay is intended as an aid in the diagnosis of influenza from Nasopharyngeal swab specimens and should not be used as Sharel Behne sole basis for treatment. Nasal washings and aspirates are unacceptable for Xpert Xpress SARS-CoV-2/FLU/RSV testing.  Fact Sheet for Patients: BloggerCourse.com  Fact Sheet for Healthcare Providers: SeriousBroker.it  This test is not yet approved or cleared by the [place] FDA and has been authorized for detection and/or diagnosis of SARS-CoV-2 by FDA under an Emergency Use Authorization (EUA). This EUA will remain in effect (meaning this test can be used) for the duration of the COVID-19 declaration under Section 564(b)(1) of the Act, 21 U.S.C. section 360bbb-3(b)(1), unless the authorization is terminated or revoked.  Performed at [facility], [address], [place], [place] [address]   SARS CORONAVIRUS 2 (TAT 6-24 HRS) Nasopharyngeal Nasopharyngeal Swab     Status: None   Collection Time: 02/03/21 11:20 AM   Specimen: Nasopharyngeal Swab  Result Value Ref Range Status   SARS Coronavirus 2 NEGATIVE NEGATIVE Final    Comment: (NOTE) SARS-CoV-2 target nucleic acids are NOT DETECTED.  The  SARS-CoV-2 RNA is generally detectable in upper and lower respiratory specimens during the acute phase of infection. Negative results do not preclude SARS-CoV-2 infection, do not rule out co-infections with other pathogens, and should not be used as the sole basis for treatment or other patient management decisions. Negative results must be combined with clinical observations, patient history, and epidemiological information. The expected result is Negative.  Fact Sheet for Patients: HairSlick.no  Fact Sheet for Healthcare Providers: quierodirigir.com  This test is not yet approved or cleared by the [place] FDA and  has been authorized for detection and/or diagnosis of SARS-CoV-2 by FDA under an Emergency Use Authorization (EUA). This EUA will remain  in effect (meaning this test can be used) for the duration of the COVID-19 declaration under Se ction 564(b)(1) of the Act, 21 U.S.C. section 360bbb-3(b)(1), unless the authorization is terminated or revoked sooner.  Performed at [facility], [address], [place], [place] [address]          Radiology Studies: DG CHEST PORT 1 VIEW  Result Date: 02/07/2021 CLINICAL DATA:  Hypoxia.  Skin EXAM: PORTABLE CHEST 1 VIEW COMPARISON:  Chest x-ray 02/04/2021, CT chest 08/25/2019 FINDINGS: Tracheostomy tube with tip terminating 7 cm above the carina. The heart size and mediastinal contours are within normal limits. Improved inspiratory effort. Improved interstitial and airspace opacities. Likely streaky atelectasis along the right mid lung zone. Persistent bilateral, right greater than left, trace pleural effusions. No pneumothorax. Bilateral acromioclavicular degenerative changes IMPRESSION: 1. Improved interstitial and airspace opacities. Likely streaky atelectasis along the right mid lung zone. 2. Persistent bilateral, right greater than left, trace pleural effusions.  Electronically Signed   By: Tish Frederickson M.D.   On: 02/07/2021 05:55        Scheduled Meds: . amoxicillin-clavulanate  1 tablet Oral Q8H  . aspirin EC  81 mg Oral Daily  . budesonide  0.5 mg Nebulization BID  . clobetasol cream  1 application Topical BID  . clonazePAM  0.25 mg Oral BID  . digoxin  0.25 mg Oral Daily  . divalproex  500 mg Oral BID  . docusate sodium  100 mg Oral BID  . enoxaparin (LOVENOX) injection  40 mg Subcutaneous Q24H  . FLUoxetine  20 mg Oral Daily  . furosemide  40 mg Intravenous BID  . gabapentin  200 mg Oral Q12H  . guaiFENesin  600 mg Oral BID  . insulin aspart  0-9 Units Subcutaneous TID WC  . lisinopril  10 mg Oral Daily  . magnesium oxide  400 mg Oral BID  . metoprolol succinate  25 mg Oral Daily  . pantoprazole sodium  40 mg Oral Daily  . predniSONE  40 mg Oral Q breakfast  . QUEtiapine  200 mg Oral QHS  . saccharomyces boulardii  250 mg Oral BID  . sodium chloride flush  3 mL Intravenous Q12H   Continuous Infusions: . sodium chloride       LOS: 5 days    Time spent: over 30 min    Lacretia Nicks, MD [facility]   To contact the attending provider between 7A-7P or the covering provider during after hours 7P-7A, please log into the web site [email or web address] and access using universal [place] password for that web site. If you do not have the password, please call the hospital operator.  02/07/2021, 9:41 AM

## 2021-02-07 NOTE — TOC Progression Note (Signed)
Transition of Care [facility]) - Progression Note    Patient Details  Name: Tommy Mathews MRN: [id number] Date of Birth: October 23, 1960  [facility] CM/SW Contact  Eduard Roux, [place] Phone Number: 02/07/2021, 10:33 AM  Clinical Narrative:     CSW informed SNF patient is not medically stable for d/c today.  Antony Blackbird, MSW, LCSW Clinical Social Worker   Expected Discharge Plan: Skilled Nursing Facility Barriers to Discharge: Barriers Resolved  Expected Discharge Plan and Services Expected Discharge Plan: Skilled Nursing Facility In-house Referral: Clinical Social Work     Living arrangements for the past 2 months: Skilled Nursing Facility Expected Discharge Date: 02/03/21                                     Social Determinants of Health (SDOH) Interventions    Readmission Risk Interventions No flowsheet data found.

## 2021-02-07 NOTE — Progress Notes (Signed)
Request sent to [facility] that the 2 nurses that assessed/attempted PIV placement be documented on IV flowsheet. Tomasita Morrow, RN VAST

## 2021-02-08 ENCOUNTER — Inpatient Hospital Stay (HOSPITAL_COMMUNITY): Payer: Medicaid Other

## 2021-02-08 DIAGNOSIS — J9621 Acute and chronic respiratory failure with hypoxia: Secondary | ICD-10-CM | POA: Diagnosis not present

## 2021-02-08 DIAGNOSIS — J440 Chronic obstructive pulmonary disease with acute lower respiratory infection: Secondary | ICD-10-CM | POA: Diagnosis not present

## 2021-02-08 DIAGNOSIS — F3130 Bipolar disorder, current episode depressed, mild or moderate severity, unspecified: Secondary | ICD-10-CM | POA: Diagnosis not present

## 2021-02-08 DIAGNOSIS — I5033 Acute on chronic diastolic (congestive) heart failure: Secondary | ICD-10-CM | POA: Diagnosis not present

## 2021-02-08 LAB — CBC WITH DIFFERENTIAL/PLATELET
Abs Immature Granulocytes: 0.03 10*3/uL (ref 0.00–0.07)
Basophils Absolute: 0 10*3/uL (ref 0.0–0.1)
Basophils Relative: 0 %
Eosinophils Absolute: 0 10*3/uL (ref 0.0–0.5)
Eosinophils Relative: 0 %
HCT: 62.4 % — ABNORMAL HIGH (ref 39.0–52.0)
Hemoglobin: 18.8 g/dL — ABNORMAL HIGH (ref 13.0–17.0)
Immature Granulocytes: 0 %
Lymphocytes Relative: 20 %
Lymphs Abs: 2.1 10*3/uL (ref 0.7–4.0)
MCH: 25.2 pg — ABNORMAL LOW (ref 26.0–34.0)
MCHC: 30.1 g/dL (ref 30.0–36.0)
MCV: 83.6 fL (ref 80.0–100.0)
Monocytes Absolute: 0.9 10*3/uL (ref 0.1–1.0)
Monocytes Relative: 9 %
Neutro Abs: 7.5 10*3/uL (ref 1.7–7.7)
Neutrophils Relative %: 71 %
Platelets: 142 10*3/uL — ABNORMAL LOW (ref 150–400)
RBC: 7.46 MIL/uL — ABNORMAL HIGH (ref 4.22–5.81)
RDW: 20.1 % — ABNORMAL HIGH (ref 11.5–15.5)
WBC: 10.6 10*3/uL — ABNORMAL HIGH (ref 4.0–10.5)
nRBC: 0 % (ref 0.0–0.2)

## 2021-02-08 LAB — COMPREHENSIVE METABOLIC PANEL
ALT: 17 U/L (ref 0–44)
AST: 36 U/L (ref 15–41)
Albumin: 2.8 g/dL — ABNORMAL LOW (ref 3.5–5.0)
Alkaline Phosphatase: 60 U/L (ref 38–126)
Anion gap: 11 (ref 5–15)
BUN: 29 mg/dL — ABNORMAL HIGH (ref 6–20)
CO2: 30 mmol/L (ref 22–32)
Calcium: 8.5 mg/dL — ABNORMAL LOW (ref 8.9–10.3)
Chloride: 92 mmol/L — ABNORMAL LOW (ref 98–111)
Creatinine, Ser: 1.29 mg/dL — ABNORMAL HIGH (ref 0.61–1.24)
GFR, Estimated: >60 mL/min (ref >60)
Glucose, Bld: 238 mg/dL — ABNORMAL HIGH (ref 70–99)
Potassium: 4.8 mmol/L (ref 3.5–5.1)
Sodium: 133 mmol/L — ABNORMAL LOW (ref 135–145)
Total Bilirubin: 1 mg/dL (ref 0.3–1.2)
Total Protein: 5.8 g/dL — ABNORMAL LOW (ref 6.5–8.1)

## 2021-02-08 LAB — GLUCOSE, CAPILLARY
Glucose-Capillary: 252 mg/dL — ABNORMAL HIGH (ref 70–99)
Glucose-Capillary: 237 mg/dL — ABNORMAL HIGH (ref 70–99)
Glucose-Capillary: 291 mg/dL — ABNORMAL HIGH (ref 70–99)
Glucose-Capillary: 328 mg/dL — ABNORMAL HIGH (ref 70–99)

## 2021-02-08 LAB — MAGNESIUM: Magnesium: 2.2 mg/dL (ref 1.7–2.4)

## 2021-02-08 LAB — PHOSPHORUS: Phosphorus: 1.9 mg/dL — ABNORMAL LOW (ref 2.5–4.6)

## 2021-02-08 MED ORDER — SODIUM PHOSPHATES 45 MMOLE/15ML IV SOLN
30.0000 mmol | Freq: Once | INTRAVENOUS | Status: AC
  Administered 2021-02-08: 30 mmol via INTRAVENOUS
  Filled 2021-02-08: qty 10

## 2021-02-08 MED ORDER — IPRATROPIUM-ALBUTEROL 0.5-2.5 (3) MG/3ML IN SOLN
3.0000 mL | Freq: Four times a day (QID) | RESPIRATORY_TRACT | Status: DC
  Administered 2021-02-08 – 2021-02-10 (×6): 3 mL via RESPIRATORY_TRACT
  Filled 2021-02-08 (×6): qty 3

## 2021-02-08 NOTE — Progress Notes (Signed)
Mobility Specialist - Progress Note   02/08/21 1107  Mobility  Activity Stood at bedside  Level of Assistance Standby assist, set-up cues, supervision of patient - no hands on  Assistive Device Front wheel walker  Mobility Response Tolerated well  Mobility performed by Mobility specialist  $Mobility charge 1 Mobility   Pre-mobility, 8L O2: 82 HR, 92% SpO2 During mobility, 9L O2: 88 HR, 89% SpO2 Post-mobility, 8L O2: 90 HR, 95% SpO2  Pt asx throughout mobility. He marched in place and did calf raises at the bedside. Pt back in bed after mobility, call bell at side and bed alarm on.   Tommy Mathews Mobility Specialist Mobility Specialist Phone: [phone]

## 2021-02-08 NOTE — Progress Notes (Addendum)
PROGRESS NOTE    Tommy Mathews  ZOX:[id number] DOB: [birth year] DOA: 01/31/2021 PCP: Tommy HousekeeperHusain, Karrar, MD  Chief Complaint  Patient presents with  . Psychiatric Evaluation   Brief Narrative:  Tommy FearingJames Lindseyis a 60 y.o.malewith medical history significant ofHTN, DM type II, systolic CHF, paroxysmal atrial fibrillation not on anticoagulation2/2history of GI bleeds, COPD,TBIs/ptracheostomy, bipolar disorder, schizophrenia, and GERD presenting after patient reported to have been harassing staff memberand thephysician in charge recommended ER visit for psychiatric evaluation. Patient only notes that he had gotten some kind of disagreement with one of the staff members there. He states that over the last couple days he has had productive cough with greenish sputum production and has been feeling short of breath. He denies having any significant fever, nausea, vomiting, chest pain, change in vision, or diarrhea symptoms. After [facility] it appears patient had been started on Augmentin 500-125 mg to complete a 7-day course as well as prednisone.In the emergency department, patient was noted to have oxygen saturation as low as 77%, with improvement after suctioning and his chronic trach with increase in supplemental oxygen use. Chest x-ray with suspicious for mild infiltrates as well as superimposed interstitial edema. He was given IV Lasix, continued on Augmentin and breathing treatments.  Patient to complete a total of 7 days of Augmentin and then stop it.  Patient had echocardiogram which did not show decreased EF.  Patient has history of bipolar disorder andschizophrenia,psych was consulted and they were recommending outpatient referral for cognitive behavioral therapy. He will continue his psych meds and does not need any inpatient psych admission.  Patient with diabetes mellitus type 2, A1c of 7 and he will continue with his current regimen.  Had planned on  discharge, but he continues to require greater than his home oxygen.  Will continue diuresis and antibiotics.  Discharge when oxygen improves to baseline (28% FiO2 requirement).  Assessment & Plan:   Principal Problem:   Acute on chronic respiratory failure with hypoxia (HCC) Active Problems:   Schizophrenia (HCC)   Bipolar I disorder, most recent episode depressed (HCC)   Acute on chronic diastolic CHF (congestive heart failure) (HCC)   COPD with acute bronchitis (HCC)   Polycythemia   Renal insufficiency  Acute on chronic hypoxemic respiratory failure with chronic trach  HFpEF Exacerbation  COPD  Concern for CAP -Secondary to acute bronchitis -on 7-8 L trach collar this AM again (desatted on 8 L to 86% with mobility, requiring 10 L with mobility to maintain 90% on 5/8) - at baseline on 28% FiO2 (5 L trach collar) per [facility] staff (they told me typically on 28% FiO2, but takes oxygen off to smoke frequently during day as well) -Chest x-ray showed interstitial lung markings with mild infiltrates with mild amount of superimposed interstitial edema - CXR 5/7 with moderate bilateral interstitial and airspace opacities, small bilateral effusions - 5/10 showing improved interstitial and airspace opacities, persistent bilateral R>L trace effusions - BNP164.1 - Procalcitonin negative - echo showed preserved EF - continue IV lasix (transition to PO when back to baseline) - interstitial opacities improving with IV lasix  -Completed Augmentin (completed 7 day course), continue breathing treatments and inhaled steroids - despite diuresis, he has required increasing amounts of oxygen -will check VQ scan  Polycythemia -Peripheral smear,EPO is elevated -> ? Secondary to underlying respiratory issues  -needs continued follow up outpatient, consider hematology follow up outpatient  History of TBI status post tracheostomy -Trach care and suctioning as needed  Paroxysmal atrial  fibrillation -CHA2DS2-VASc score 4, not on anticoagulation due to history of GI bleeding -Continue aspirin, digoxin, metoprolol -In normal sinus rhythm this morning  HTN -Continue lisinopril, Toprol  History of bipolar disorder, schizophrenia -Continue Seroquel, Klonopin, Depakote, Prozac -Psychiatry consulted,recommended for outpatient referral for cognitive behavioral therapy. No recommendations to adjust his medications at this point. Does not need inpatient psych admission.  Diabetes mellitus type 2 -Hemoglobin A1c 7.0 -Continue sliding scale insulin   DVT prophylaxis: lovenox Code Status: full  Family Communication: discussed with patient Disposition:   Status is: Inpatient  Remains inpatient appropriate because:Inpatient level of care appropriate due to severity of illness   Dispo: The patient is from: SNF              Anticipated d/c is to: SNF              Patient currently is not medically stable to d/c.   Difficult to place patient No       Consultants:   psychiatry  Procedures:  Echo IMPRESSIONS    1. Left ventricular ejection fraction, by estimation, is 55%. The left  ventricle has normal function. The left ventricle has no regional wall  motion abnormalities. Left ventricular diastolic parameters were normal.  2. Right ventricular systolic function is normal. The right ventricular  size is normal.  3. Left atrial size was mildly dilated.  4. The pericardial effusion is posterior to the left ventricle.  5. The mitral valve is normal in structure. No evidence of mitral valve  regurgitation. No evidence of mitral stenosis.  6. The aortic valve is tricuspid. There is mild calcification of the  aortic valve. Aortic valve regurgitation is not visualized. Mild aortic  valve sclerosis is present, with no evidence of aortic valve stenosis.  7. The inferior vena cava is normal in size with greater than 50%  respiratory variability, suggesting  right atrial pressure of 3 mmHg.   Antimicrobials:  Anti-infectives (From admission, onward)   Start     Dose/Rate Route Frequency Ordered Stop   02/01/21 1400  amoxicillin-clavulanate (AUGMENTIN) 500-125 MG per tablet 500 mg        1 tablet Oral Every 8 hours 02/01/21 1240 02/08/21 0605         Subjective: Feels that breathing is improving. No wheezing. He is being suctioned from trach  Objective: Vitals:   02/08/21 1203 02/08/21 1231 02/08/21 1507 02/08/21 1628  BP:  131/86  111/77  Pulse: 87 76 76 72  Resp: 18 19 20 20   Temp:  98.6 F (37 C)  98.7 F (37.1 C)  TempSrc:  Oral  Oral  SpO2: 92% 91% 91% (!) 89%  Weight:      Height:        Intake/Output Summary (Last 24 hours) at 02/08/2021 1702 Last data filed at 02/08/2021 1629 Gross per 24 hour  Intake 720 ml  Output 2745 ml  Net -2025 ml   Filed Weights   02/06/21 0413 02/07/21 0346 02/08/21 0328  Weight: 99.7 kg 98.8 kg 100.6 kg    Examination:  General exam: Alert, awake, oriented x 3 Respiratory system: Clear to auscultation. Respiratory effort normal. Trach in place Cardiovascular system:RRR. No murmurs, rubs, gallops. Gastrointestinal system: Abdomen is nondistended, soft and nontender. No organomegaly or masses felt. Normal bowel sounds heard. Central nervous system: Alert and oriented. No focal neurological deficits. Extremities: No C/C/E, +pedal pulses Skin: No rashes, lesions or ulcers Psychiatry: Judgement and insight appear normal. Mood &  affect appropriate.     Data Reviewed: I have personally reviewed following labs and imaging studies  CBC: Recent Labs  Lab 02/04/21 0803 02/05/21 0058 02/06/21 0437 02/07/21 0137 02/08/21 0113  WBC 8.9 9.9 11.4* 10.6* 10.6*  NEUTROABS 5.0 6.7 7.5 7.0 7.5  HGB 18.4* 18.4* 18.7* 19.2* 18.8*  HCT 62.5* 61.5* 63.0* 63.9* 62.4*  MCV 84.7 84.0 84.1 84.0 83.6  PLT 155 171 170 158 142*    Basic Metabolic Panel: Recent Labs  Lab 02/04/21 0803  02/05/21 0058 02/06/21 0728 02/07/21 0137 02/08/21 0113  NA 136 134* 136 133* 133*  K 4.7 4.4 4.1 4.3 4.8  CL 101 97* 95* 95* 92*  CO2 30 31 32 29 30  GLUCOSE 97 181* 189* 194* 238*  BUN 14 20 22* 27* 29*  CREATININE 0.99 1.08 1.10 1.15 1.29*  CALCIUM 8.4* 8.8* 8.7* 8.5* 8.5*  MG  --  2.0 2.1 2.2 2.2  PHOS  --  3.2 3.4 2.8 1.9*    GFR: Estimated Creatinine Clearance: 76.6 mL/min (A) (by C-G formula based on SCr of 1.29 mg/dL (H)).  Liver Function Tests: Recent Labs  Lab 02/04/21 0803 02/05/21 0058 02/06/21 0728 02/07/21 0137 02/08/21 0113  AST 15 18 24 26  36  ALT 11 11 14 17 17   ALKPHOS 49 52 53 57 60  BILITOT 0.7 0.4 0.8 0.6 1.0  PROT 5.8* 6.2* 5.8* 6.3* 5.8*  ALBUMIN 2.6* 2.7* 2.6* 2.7* 2.8*    CBG: Recent Labs  Lab 02/07/21 1554 02/07/21 2110 02/08/21 0613 02/08/21 1230 02/08/21 1631  GLUCAP 297* 294* 252* 237* 291*     Recent Results (from the past 240 hour(s))  Resp Panel by RT-PCR (Flu A&B, Covid) Nasopharyngeal Swab     Status: None   Collection Time: 02/01/21 12:29 AM   Specimen: Nasopharyngeal Swab; Nasopharyngeal(NP) swabs in vial transport medium  Result Value Ref Range Status   SARS Coronavirus 2 by RT PCR NEGATIVE NEGATIVE Final    Comment: (NOTE) SARS-CoV-2 target nucleic acids are NOT DETECTED.  The SARS-CoV-2 RNA is generally detectable in upper respiratory specimens during the acute phase of infection. The lowest concentration of SARS-CoV-2 viral copies this assay can detect is 138 copies/mL. A negative result does not preclude SARS-Cov-2 infection and should not be used as the sole basis for treatment or other patient management decisions. A negative result may occur with  improper specimen collection/handling, submission of specimen other than nasopharyngeal swab, presence of viral mutation(s) within the areas targeted by this assay, and inadequate number of viral copies(<138 copies/mL). A negative result must be combined  with clinical observations, patient history, and epidemiological information. The expected result is Negative.  Fact Sheet for Patients:  04/10/21  Fact Sheet for Healthcare Providers:  04/03/21  This test is no t yet approved or cleared by the [employer] FDA and  has been authorized for detection and/or diagnosis of SARS-CoV-2 by FDA under an Emergency Use Authorization (EUA). This EUA will remain  in effect (meaning this test can be used) for the duration of the COVID-19 declaration under Section 564(b)(1) of the Act, 21 U.S.C.section 360bbb-3(b)(1), unless the authorization is terminated  or revoked sooner.       Influenza A by PCR NEGATIVE NEGATIVE Final   Influenza B by PCR NEGATIVE NEGATIVE Final    Comment: (NOTE) The Xpert Xpress SARS-CoV-2/FLU/RSV plus assay is intended as an aid in the diagnosis of influenza from Nasopharyngeal swab specimens and should not be used as a  sole basis for treatment. Nasal washings and aspirates are unacceptable for Xpert Xpress SARS-CoV-2/FLU/RSV testing.  Fact Sheet for Patients: BloggerCourse.com  Fact Sheet for Healthcare Providers: SeriousBroker.it  This test is not yet approved or cleared by the [place] FDA and has been authorized for detection and/or diagnosis of SARS-CoV-2 by FDA under an Emergency Use Authorization (EUA). This EUA will remain in effect (meaning this test can be used) for the duration of the COVID-19 declaration under Section 564(b)(1) of the Act, 21 U.S.C. section 360bbb-3(b)(1), unless the authorization is terminated or revoked.  Performed at [facility], [address] [place], [place] [address]   SARS CORONAVIRUS 2 (TAT 6-24 HRS) Nasopharyngeal Nasopharyngeal Swab     Status: None   Collection Time: 02/03/21 11:20 AM   Specimen: Nasopharyngeal Swab  Result Value Ref Range  Status   SARS Coronavirus 2 NEGATIVE NEGATIVE Final    Comment: (NOTE) SARS-CoV-2 target nucleic acids are NOT DETECTED.  The SARS-CoV-2 RNA is generally detectable in upper and lower respiratory specimens during the acute phase of infection. Negative results do not preclude SARS-CoV-2 infection, do not rule out co-infections with other pathogens, and should not be used as the sole basis for treatment or other patient management decisions. Negative results must be combined with clinical observations, patient history, and epidemiological information. The expected result is Negative.  Fact Sheet for Patients: HairSlick.no  Fact Sheet for Healthcare Providers: quierodirigir.com  This test is not yet approved or cleared by the [place] FDA and  has been authorized for detection and/or diagnosis of SARS-CoV-2 by FDA under an Emergency Use Authorization (EUA). This EUA will remain  in effect (meaning this test can be used) for the duration of the COVID-19 declaration under Se ction 564(b)(1) of the Act, 21 U.S.C. section 360bbb-3(b)(1), unless the authorization is terminated or revoked sooner.  Performed at [facility], [address], [place], [place] [address]          Radiology Studies: DG CHEST PORT 1 VIEW  Result Date: 02/07/2021 CLINICAL DATA:  Hypoxia.  Skin EXAM: PORTABLE CHEST 1 VIEW COMPARISON:  Chest x-ray 02/04/2021, CT chest 08/25/2019 FINDINGS: Tracheostomy tube with tip terminating 7 cm above the carina. The heart size and mediastinal contours are within normal limits. Improved inspiratory effort. Improved interstitial and airspace opacities. Likely streaky atelectasis along the right mid lung zone. Persistent bilateral, right greater than left, trace pleural effusions. No pneumothorax. Bilateral acromioclavicular degenerative changes IMPRESSION: 1. Improved interstitial and airspace opacities. Likely  streaky atelectasis along the right mid lung zone. 2. Persistent bilateral, right greater than left, trace pleural effusions. Electronically Signed   By: Tish Frederickson M.D.   On: 02/07/2021 05:55        Scheduled Meds: . aspirin EC  81 mg Oral Daily  . budesonide  0.5 mg Nebulization BID  . clobetasol cream  1 application Topical BID  . clonazePAM  0.25 mg Oral BID  . digoxin  0.25 mg Oral Daily  . divalproex  500 mg Oral BID  . docusate sodium  100 mg Oral BID  . enoxaparin (LOVENOX) injection  40 mg Subcutaneous Q24H  . FLUoxetine  20 mg Oral Daily  . furosemide  40 mg Intravenous BID  . gabapentin  200 mg Oral Q12H  . guaiFENesin  600 mg Oral BID  . insulin aspart  0-9 Units Subcutaneous TID WC  . ipratropium-albuterol  3 mL Nebulization Q6H  . lisinopril  10 mg Oral  Daily  . magnesium oxide  400 mg Oral BID  . metoprolol succinate  25 mg Oral Daily  . pantoprazole sodium  40 mg Oral Daily  . QUEtiapine  200 mg Oral QHS  . saccharomyces boulardii  250 mg Oral BID  . sodium chloride flush  3 mL Intravenous Q12H   Continuous Infusions: . sodium chloride    . sodium phosphate  Dextrose 5% IVPB       LOS: 6 days    Time spent: over 30 min    Erick Blinks, MD [facility]   To contact the attending provider between 7A-7P or the covering provider during after hours 7P-7A, please log into the web site [email or web address] and access using universal [place] password for that web site. If you do not have the password, please call the hospital operator.  02/08/2021, 5:02 PM

## 2021-02-08 NOTE — TOC Progression Note (Signed)
Transition of Care [facility]) - Progression Note    Patient Details  Name: [name] MRN: [id number] Date of Birth: 20-Oct-1960  Transition of [facility]) CM/SW Contact  Gildardo Griffes, [place] Phone Number: 02/08/2021, 11:30 AM  Clinical Narrative:     TOC continues to follow for discharge readiness for patient to return to [facility].  Expected Discharge Plan: Skilled Nursing Facility Barriers to Discharge: Barriers Resolved  Expected Discharge Plan and Services Expected Discharge Plan: Skilled Nursing Facility In-house Referral: Clinical Social Work     Living arrangements for the past 2 months: Skilled Nursing Facility Expected Discharge Date: 02/03/21                                     Social Determinants of Health (SDOH) Interventions    Readmission Risk Interventions No flowsheet data found.

## 2021-02-09 ENCOUNTER — Inpatient Hospital Stay (HOSPITAL_COMMUNITY): Payer: Medicaid Other

## 2021-02-09 DIAGNOSIS — J440 Chronic obstructive pulmonary disease with acute lower respiratory infection: Secondary | ICD-10-CM | POA: Diagnosis not present

## 2021-02-09 DIAGNOSIS — F3130 Bipolar disorder, current episode depressed, mild or moderate severity, unspecified: Secondary | ICD-10-CM | POA: Diagnosis not present

## 2021-02-09 DIAGNOSIS — J9621 Acute and chronic respiratory failure with hypoxia: Secondary | ICD-10-CM | POA: Diagnosis not present

## 2021-02-09 DIAGNOSIS — I5033 Acute on chronic diastolic (congestive) heart failure: Secondary | ICD-10-CM | POA: Diagnosis not present

## 2021-02-09 LAB — RENAL FUNCTION PANEL
Albumin: 2.6 g/dL — ABNORMAL LOW (ref 3.5–5.0)
Anion gap: 10 (ref 5–15)
BUN: 22 mg/dL — ABNORMAL HIGH (ref 6–20)
CO2: 33 mmol/L — ABNORMAL HIGH (ref 22–32)
Calcium: 8.6 mg/dL — ABNORMAL LOW (ref 8.9–10.3)
Chloride: 94 mmol/L — ABNORMAL LOW (ref 98–111)
Creatinine, Ser: 1.08 mg/dL (ref 0.61–1.24)
GFR, Estimated: >60 mL/min (ref >60)
Glucose, Bld: 146 mg/dL — ABNORMAL HIGH (ref 70–99)
Phosphorus: 4 mg/dL (ref 2.5–4.6)
Potassium: 3.9 mmol/L (ref 3.5–5.1)
Sodium: 137 mmol/L (ref 135–145)

## 2021-02-09 LAB — GLUCOSE, CAPILLARY
Glucose-Capillary: 141 mg/dL — ABNORMAL HIGH (ref 70–99)
Glucose-Capillary: 219 mg/dL — ABNORMAL HIGH (ref 70–99)
Glucose-Capillary: 254 mg/dL — ABNORMAL HIGH (ref 70–99)
Glucose-Capillary: 344 mg/dL — ABNORMAL HIGH (ref 70–99)

## 2021-02-09 MED ORDER — TECHNETIUM TO 99M ALBUMIN AGGREGATED
4.1000 | Freq: Once | INTRAVENOUS | Status: AC | PRN
  Administered 2021-02-09: 4.1 via INTRAVENOUS

## 2021-02-09 NOTE — Progress Notes (Signed)
Upon arrive to pt's room, RN saw pt eating his burger without putting his passing valve on his trach.  Education about at risk of getting aspiration given to pt.   Lawson Radar, RN

## 2021-02-09 NOTE — Progress Notes (Signed)
PROGRESS NOTE    Tommy Mathews  BTD:[id number] DOB: 07-08-1961 DOA: 01/31/2021 PCP: Georgann Housekeeper, MD  Chief Complaint  Patient presents with  . Psychiatric Evaluation   Brief Narrative:  Tommy Lindseyis a 60 y.o.malewith medical history significant ofHTN, DM type II, systolic CHF, paroxysmal atrial fibrillation not on anticoagulation2/2history of GI bleeds, COPD,TBIs/ptracheostomy, bipolar disorder, schizophrenia, and GERD presenting after patient reported to have been harassing staff memberand thephysician in charge recommended ER visit for psychiatric evaluation. Patient only notes that he had gotten some kind of disagreement with one of the staff members there. He states that over the last couple days he has had productive cough with greenish sputum production and has been feeling short of breath. He denies having any significant fever, nausea, vomiting, chest pain, change in vision, or diarrhea symptoms. After talking to staff at [facility] it appears patient had been started on Augmentin 500-125 mg to complete a 7-day course as well as prednisone.In the emergency department, patient was noted to have oxygen saturation as low as 77%, with improvement after suctioning and his chronic trach with increase in supplemental oxygen use. Chest x-ray with suspicious for mild infiltrates as well as superimposed interstitial edema. He was given IV Lasix, continued on Augmentin and breathing treatments.  Patient to complete a total of 7 days of Augmentin and then stop it.  Patient had echocardiogram which did not show decreased EF.  Patient has history of bipolar disorder andschizophrenia,psych was consulted and they were recommending outpatient referral for cognitive behavioral therapy. He will continue his psych meds and does not need any inpatient psych admission.  Patient with diabetes mellitus type 2, A1c of 7 and he will continue with his current regimen.  Had planned on  discharge, but he continues to require greater than his home oxygen.  Will continue diuresis and antibiotics.  Discharge when oxygen improves to baseline (28% FiO2 requirement).  Assessment & Plan:   Principal Problem:   Acute on chronic respiratory failure with hypoxia (HCC) Active Problems:   Schizophrenia (HCC)   Bipolar I disorder, most recent episode depressed (HCC)   Acute on chronic diastolic CHF (congestive heart failure) (HCC)   COPD with acute bronchitis (HCC)   Polycythemia   Renal insufficiency  Acute on chronic hypoxemic respiratory failure with chronic trach  HFpEF Exacerbation  COPD  Concern for CAP -Secondary to acute bronchitis -on 7-8 L trach collar this AM again (desatted on 8 L to 86% with mobility, requiring 10 L with mobility to maintain 90% on 5/8) - at baseline on 28% FiO2 (5 L trach collar) per [facility] staff (they told me typically on 28% FiO2, but takes oxygen off to smoke frequently during day as well) -Chest x-ray showed interstitial lung markings with mild infiltrates with mild amount of superimposed interstitial edema - CXR 5/7 with moderate bilateral interstitial and airspace opacities, small bilateral effusions - 5/10 showing improved interstitial and airspace opacities, persistent bilateral R>L trace effusions - BNP164.1 - Procalcitonin negative - echo showed preserved EF - continue IV lasix (transition to PO when back to baseline) - interstitial opacities improving with IV lasix  -Completed Augmentin (completed 7 day course), continue breathing treatments and inhaled steroids - VQ scan negative for PE -will ask that pulse ox probe be changed to ear lobe to ensure accurate readings -continue to wean off oxygen as tolerated  Polycythemia -Peripheral smear,EPO is elevated -> ? Secondary to underlying respiratory issues  -needs continued follow up outpatient, consider hematology follow up  outpatient  History of TBI status post  tracheostomy -Trach care and suctioning as needed  Paroxysmal atrial fibrillation -CHA2DS2-VASc score 4, not on anticoagulation due to history of GI bleeding -Continue aspirin, digoxin, metoprolol -In normal sinus rhythm this morning  HTN -Continue lisinopril, Toprol  History of bipolar disorder, schizophrenia -Continue Seroquel, Klonopin, Depakote, Prozac -Psychiatry consulted,recommended for outpatient referral for cognitive behavioral therapy. No recommendations to adjust his medications at this point. Does not need inpatient psych admission.  Diabetes mellitus type 2 -Hemoglobin A1c 7.0 -Continue sliding scale insulin   DVT prophylaxis: lovenox Code Status: full  Family Communication: discussed with patient Disposition:   Status is: Inpatient  Remains inpatient appropriate because:Inpatient level of care appropriate due to severity of illness   Dispo: The patient is from: SNF              Anticipated d/c is to: SNF              Patient currently is not medically stable to d/c.   Difficult to place patient No       Consultants:   psychiatry  Procedures:  Echo IMPRESSIONS    1. Left ventricular ejection fraction, by estimation, is 55%. The left  ventricle has normal function. The left ventricle has no regional wall  motion abnormalities. Left ventricular diastolic parameters were normal.  2. Right ventricular systolic function is normal. The right ventricular  size is normal.  3. Left atrial size was mildly dilated.  4. The pericardial effusion is posterior to the left ventricle.  5. The mitral valve is normal in structure. No evidence of mitral valve  regurgitation. No evidence of mitral stenosis.  6. The aortic valve is tricuspid. There is mild calcification of the  aortic valve. Aortic valve regurgitation is not visualized. Mild aortic  valve sclerosis is present, with no evidence of aortic valve stenosis.  7. The inferior vena cava is  normal in size with greater than 50%  respiratory variability, suggesting right atrial pressure of 3 mmHg.   Antimicrobials:  Anti-infectives (From admission, onward)   Start     Dose/Rate Route Frequency Ordered Stop   02/01/21 1400  amoxicillin-clavulanate (AUGMENTIN) 500-125 MG per tablet 500 mg        1 tablet Oral Every 8 hours 02/01/21 1240 02/08/21 0605         Subjective: Feels breathing continues to improve, no significant cough  Objective: Vitals:   02/09/21 0826 02/09/21 1040 02/09/21 1056 02/09/21 1100  BP: 124/79 140/86    Pulse: 75 66 66 66  Resp: Temp: (!) 97.1 F (36.2 C)   97.7 F (36.5 C)  TempSrc: Oral   Oral  SpO2: 94%  94% 93%  Weight:      Height:        Intake/Output Summary (Last 24 hours) at 02/09/2021 1313 Last data filed at 02/09/2021 1235 Gross per 24 hour  Intake 1186.03 ml  Output 875 ml  Net 311.03 ml   Filed Weights   02/07/21 0346 02/08/21 0328 02/09/21 1610  Weight: 98.8 kg 100.6 kg 101.1 kg    Examination:  General exam: Alert, awake, oriented x 3 Respiratory system: Clear to auscultation. Respiratory effort normal. Trach in place Cardiovascular system:RRR. No murmurs, rubs, gallops. Gastrointestinal system: Abdomen is nondistended, soft and nontender. No organomegaly or masses felt. Normal bowel sounds heard. Central nervous system: Alert and oriented. No focal neurological deficits. Extremities: No C/C/E, +pedal pulses Skin: No rashes, lesions  or ulcers Psychiatry: Judgement and insight appear normal. Mood & affect appropriate.      Data Reviewed: I have personally reviewed following labs and imaging studies  CBC: Recent Labs  Lab 02/04/21 0803 02/05/21 0058 02/06/21 0437 02/07/21 0137 02/08/21 0113  WBC 8.9 9.9 11.4* 10.6* 10.6*  NEUTROABS 5.0 6.7 7.5 7.0 7.5  HGB 18.4* 18.4* 18.7* 19.2* 18.8*  HCT 62.5* 61.5* 63.0* 63.9* 62.4*  MCV 84.7 84.0 84.1 84.0 83.6  PLT 155 171 170 158 142*    Basic  Metabolic Panel: Recent Labs  Lab 02/05/21 0058 02/06/21 0728 02/07/21 0137 02/08/21 0113 02/09/21 0143  NA 134* 136 133* 133* 137  K 4.4 4.1 4.3 4.8 3.9  CL 97* 95* 95* 92* 94*  CO2 31 32 29 30 33*  GLUCOSE 181* 189* 194* 238* 146*  BUN 20 22* 27* 29* 22*  CREATININE 1.08 1.10 1.15 1.29* 1.08  CALCIUM 8.8* 8.7* 8.5* 8.5* 8.6*  MG 2.0 2.1 2.2 2.2  --   PHOS 3.2 3.4 2.8 1.9* 4.0    GFR: Estimated Creatinine Clearance: 91.7 mL/min (by C-G formula based on SCr of 1.08 mg/dL).  Liver Function Tests: Recent Labs  Lab 02/04/21 0803 02/05/21 0058 02/06/21 0728 02/07/21 0137 02/08/21 0113 02/09/21 0143  AST 15 18 24 26  36  --   ALT 11 11 14 17 17   --   ALKPHOS 49 52 53 57 60  --   BILITOT 0.7 0.4 0.8 0.6 1.0  --   PROT 5.8* 6.2* 5.8* 6.3* 5.8*  --   ALBUMIN 2.6* 2.7* 2.6* 2.7* 2.8* 2.6*    CBG: Recent Labs  Lab 02/08/21 1230 02/08/21 1631 02/08/21 2139 02/09/21 0617 02/09/21 1114  GLUCAP 237* 291* 328* 141* 219*     Recent Results (from the past 240 hour(s))  Resp Panel by RT-PCR (Flu A&B, Covid) Nasopharyngeal Swab     Status: None   Collection Time: 02/01/21 12:29 AM   Specimen: Nasopharyngeal Swab; Nasopharyngeal(NP) swabs in vial transport medium  Result Value Ref Range Status   SARS Coronavirus 2 by RT PCR NEGATIVE NEGATIVE Final    Comment: (NOTE) SARS-CoV-2 target nucleic acids are NOT DETECTED.  The SARS-CoV-2 RNA is generally detectable in upper respiratory specimens during the acute phase of infection. The lowest concentration of SARS-CoV-2 viral copies this assay can detect is 138 copies/mL. A negative result does not preclude SARS-Cov-2 infection and should not be used as the sole basis for treatment or other patient management decisions. A negative result may occur with  improper specimen collection/handling, submission of specimen other than nasopharyngeal swab, presence of viral mutation(s) within the areas targeted by this assay, and  inadequate number of viral copies(<138 copies/mL). A negative result must be combined with clinical observations, patient history, and epidemiological information. The expected result is Negative.  Fact Sheet for Patients:  04/11/21  Fact Sheet for Healthcare Providers:  04/03/21  This test is no t yet approved or cleared by the [employer] FDA and  has been authorized for detection and/or diagnosis of SARS-CoV-2 by FDA under an Emergency Use Authorization (EUA). This EUA will remain  in effect (meaning this test can be used) for the duration of the COVID-19 declaration under Section 564(b)(1) of the Act, 21 U.S.C.section 360bbb-3(b)(1), unless the authorization is terminated  or revoked sooner.       Influenza A by PCR NEGATIVE NEGATIVE Final   Influenza B by PCR NEGATIVE NEGATIVE Final    Comment: (NOTE) The Xpert  Xpress SARS-CoV-2/FLU/RSV plus assay is intended as an aid in the diagnosis of influenza from Nasopharyngeal swab specimens and should not be used as a sole basis for treatment. Nasal washings and aspirates are unacceptable for Xpert Xpress SARS-CoV-2/FLU/RSV testing.  Fact Sheet for Patients: BloggerCourse.com  Fact Sheet for Healthcare Providers: SeriousBroker.it  This test is not yet approved or cleared by the [place] FDA and has been authorized for detection and/or diagnosis of SARS-CoV-2 by FDA under an Emergency Use Authorization (EUA). This EUA will remain in effect (meaning this test can be used) for the duration of the COVID-19 declaration under Section 564(b)(1) of the Act, 21 U.S.C. section 360bbb-3(b)(1), unless the authorization is terminated or revoked.  Performed at [facility], [address] [place], [place] [address]   SARS CORONAVIRUS 2 (TAT 6-24 HRS) Nasopharyngeal Nasopharyngeal Swab     Status: None    Collection Time: 02/03/21 11:20 AM   Specimen: Nasopharyngeal Swab  Result Value Ref Range Status   SARS Coronavirus 2 NEGATIVE NEGATIVE Final    Comment: (NOTE) SARS-CoV-2 target nucleic acids are NOT DETECTED.  The SARS-CoV-2 RNA is generally detectable in upper and lower respiratory specimens during the acute phase of infection. Negative results do not preclude SARS-CoV-2 infection, do not rule out co-infections with other pathogens, and should not be used as the sole basis for treatment or other patient management decisions. Negative results must be combined with clinical observations, patient history, and epidemiological information. The expected result is Negative.  Fact Sheet for Patients: HairSlick.no  Fact Sheet for Healthcare Providers: quierodirigir.com  This test is not yet approved or cleared by the [place] FDA and  has been authorized for detection and/or diagnosis of SARS-CoV-2 by FDA under an Emergency Use Authorization (EUA). This EUA will remain  in effect (meaning this test can be used) for the duration of the COVID-19 declaration under Se ction 564(b)(1) of the Act, 21 U.S.C. section 360bbb-3(b)(1), unless the authorization is terminated or revoked sooner.  Performed at [facility], [address], [place], [place] [address]          Radiology Studies: DG Chest 1 View  Result Date: 02/08/2021 CLINICAL DATA:  Check tracheostomy catheter placement EXAM: CHEST  1 VIEW COMPARISON:  02/07/2021 FINDINGS: Tracheostomy tube is again identified in satisfactory position. Cardiac shadow is stable. Patchy atelectatic changes are noted in the bases bilaterally. No bony abnormality is seen. IMPRESSION: Patchy atelectatic changes in the bases. No acute abnormality noted. Electronically Signed   By: Alcide Clever M.D.   On: 02/08/2021 19:56   NM Pulmonary Perf and Vent  Result Date: 02/09/2021 CLINICAL  DATA:  Respiratory failure. EXAM: NUCLEAR MEDICINE PERFUSION LUNG SCAN TECHNIQUE: Perfusion images were obtained in multiple projections after intravenous injection of radiopharmaceutical. Ventilation scans intentionally deferred if perfusion scan and chest x-ray adequate for interpretation during COVID 19 epidemic. RADIOPHARMACEUTICALS:  4.1 millicuries mCi Tc-31m MAA IV COMPARISON:  Chest x-ray Feb 08, 2021 FINDINGS: There are no segmental perfusion defects identified in the lungs. The perfusion is within normal limits. IMPRESSION: No segmental perfusion defects identified in the lungs to suggest pulmonary embolus. No evidence of pulmonary embolus on this study. Electronically Signed   By: Gerome Sam III M.D   On: 02/09/2021 10:36        Scheduled Meds: . aspirin EC  81 mg Oral Daily  . budesonide  0.5 mg Nebulization BID  . clobetasol cream  1 application Topical BID  .  clonazePAM  0.25 mg Oral BID  . digoxin  0.25 mg Oral Daily  . divalproex  500 mg Oral BID  . docusate sodium  100 mg Oral BID  . enoxaparin (LOVENOX) injection  40 mg Subcutaneous Q24H  . FLUoxetine  20 mg Oral Daily  . furosemide  40 mg Intravenous BID  . gabapentin  200 mg Oral Q12H  . guaiFENesin  600 mg Oral BID  . insulin aspart  0-9 Units Subcutaneous TID WC  . ipratropium-albuterol  3 mL Nebulization Q6H  . lisinopril  10 mg Oral Daily  . magnesium oxide  400 mg Oral BID  . metoprolol succinate  25 mg Oral Daily  . pantoprazole sodium  40 mg Oral Daily  . QUEtiapine  200 mg Oral QHS  . saccharomyces boulardii  250 mg Oral BID  . sodium chloride flush  3 mL Intravenous Q12H   Continuous Infusions: . sodium chloride       LOS: 7 days    Time spent: over 30 min    Erick BlinksJehanzeb Lanaysia Fritchman, MD [facility]   To contact the attending provider between 7A-7P or the covering provider during after hours 7P-7A, please log into the web site [email or web address] and access using universal [place] password for  that web site. If you do not have the password, please call the hospital operator.  02/09/2021, 1:13 PM

## 2021-02-09 NOTE — Progress Notes (Signed)
Mobility Specialist - Progress Note   02/09/21 1510  Mobility  Activity Ambulated in room  Level of Assistance Standby assist, set-up cues, supervision of patient - no hands on  Assistive Device Front wheel walker  Distance Ambulated (ft) 6 ft  Mobility Ambulated with assistance in room  Mobility Response Tolerated well  Mobility performed by Mobility specialist  $Mobility charge 1 Mobility   Pt attempted to take off his trach collar once standing at bedside, his SpO2 dropped to 77% during the time it took to put it back on properly, it quickly recover to >90% once back on. Pt was then able to stay ~91% on 8L O2 throughout mobility. Pt walked to sink 3 ft, then 3 ft backwards to the bed. Pt back in bed after mobility, bed alarm on and call bell at side.   Tommy Mathews Mobility Specialist Mobility Specialist Phone: [phone]

## 2021-02-09 NOTE — TOC Progression Note (Signed)
Transition of Care [facility]) - Progression Note    Patient Details  Name: Tommy Mathews MRN: [id number] Date of Birth: 25-Mar-1961  [facility] CM/SW Contact  Terrial Rhodes, LCSWA Phone Number: 02/09/2021, 3:43 PM  Clinical Narrative:      CSW continues to follow for discharge readiness for patient to return to [facility].    Expected Discharge Plan: Skilled Nursing Facility Barriers to Discharge: Barriers Resolved  Expected Discharge Plan and Services Expected Discharge Plan: Skilled Nursing Facility In-house Referral: Clinical Social Work     Living arrangements for the past 2 months: Skilled Nursing Facility Expected Discharge Date: 02/03/21                                     Social Determinants of Health (SDOH) Interventions    Readmission Risk Interventions No flowsheet data found.

## 2021-02-09 NOTE — Progress Notes (Signed)
SPO2 85-89% on trech-collar  8 LPM. Notified [place] RT, recommended to increase FIO2 from 35% to 60%. We will continue to monitor.  Filiberto Pinks, RN

## 2021-02-10 DIAGNOSIS — J9621 Acute and chronic respiratory failure with hypoxia: Secondary | ICD-10-CM | POA: Diagnosis not present

## 2021-02-10 DIAGNOSIS — F3130 Bipolar disorder, current episode depressed, mild or moderate severity, unspecified: Secondary | ICD-10-CM | POA: Diagnosis not present

## 2021-02-10 DIAGNOSIS — I5033 Acute on chronic diastolic (congestive) heart failure: Secondary | ICD-10-CM | POA: Diagnosis not present

## 2021-02-10 DIAGNOSIS — J440 Chronic obstructive pulmonary disease with acute lower respiratory infection: Secondary | ICD-10-CM | POA: Diagnosis not present

## 2021-02-10 LAB — BASIC METABOLIC PANEL
Anion gap: 9 (ref 5–15)
BUN: 25 mg/dL — ABNORMAL HIGH (ref 6–20)
CO2: 32 mmol/L (ref 22–32)
Calcium: 8.4 mg/dL — ABNORMAL LOW (ref 8.9–10.3)
Chloride: 93 mmol/L — ABNORMAL LOW (ref 98–111)
Creatinine, Ser: 1.13 mg/dL (ref 0.61–1.24)
GFR, Estimated: >60 mL/min (ref >60)
Glucose, Bld: 223 mg/dL — ABNORMAL HIGH (ref 70–99)
Potassium: 4.7 mmol/L (ref 3.5–5.1)
Sodium: 134 mmol/L — ABNORMAL LOW (ref 135–145)

## 2021-02-10 LAB — GLUCOSE, CAPILLARY
Glucose-Capillary: 232 mg/dL — ABNORMAL HIGH (ref 70–99)
Glucose-Capillary: 342 mg/dL — ABNORMAL HIGH (ref 70–99)
Glucose-Capillary: 314 mg/dL — ABNORMAL HIGH (ref 70–99)
Glucose-Capillary: 307 mg/dL — ABNORMAL HIGH (ref 70–99)

## 2021-02-10 LAB — RESP PANEL BY RT-PCR (FLU A&B, COVID) ARPGX2
Influenza A by PCR: NEGATIVE
Influenza B by PCR: NEGATIVE
SARS Coronavirus 2 by RT PCR: NEGATIVE

## 2021-02-10 MED ORDER — FUROSEMIDE 40 MG PO TABS: 60.0000 mg | ORAL_TABLET | Freq: Two times a day (BID) | ORAL | Status: DC

## 2021-02-10 MED ORDER — IPRATROPIUM-ALBUTEROL 0.5-2.5 (3) MG/3ML IN SOLN
3.0000 mL | Freq: Two times a day (BID) | RESPIRATORY_TRACT | Status: DC
  Administered 2021-02-10 – 2021-02-11 (×2): 3 mL via RESPIRATORY_TRACT
  Filled 2021-02-10 (×2): qty 3

## 2021-02-10 MED ORDER — IPRATROPIUM-ALBUTEROL 0.5-2.5 (3) MG/3ML IN SOLN: 3.0000 mL | Freq: Two times a day (BID) | RESPIRATORY_TRACT | Status: DC

## 2021-02-10 MED ORDER — COVID-19 MRNA VACC (MODERNA) 100 MCG/0.5ML IM SUSP
0.5000 mL | Freq: Once | INTRAMUSCULAR | Status: AC
  Administered 2021-02-10: 0.5 mL via INTRAMUSCULAR
  Filled 2021-02-10: qty 0.5

## 2021-02-10 NOTE — Progress Notes (Deleted)
Physician Discharge Summary  Madix Blowe XWR:[id number] DOB: May 09, 1961 DOA: 01/31/2021  PCP: Georgann Housekeeper, MD  Admit date: 01/31/2021 Discharge date: 02/10/2021  Admitted From: [facility] Disposition:  [facility]  Recommendations for Outpatient Follow-up:  1. Follow up with PCP in 1-2 weeks 2. Please obtain BMP/CBC in one week  Discharge Condition:stable CODE STATUS:full code Diet recommendation: heart healthy, carb modified  Brief/Interim Summary: Monty Lindseyis a 60 y.o.malewith medical history significant ofHTN, DM type II, systolic CHF, paroxysmal atrial fibrillation not on anticoagulation2/2history of GI bleeds, COPD,TBIs/ptracheostomy, bipolar disorder, schizophrenia, and GERD presenting after patient reported to have been harassing staff memberand thephysician in charge recommended ER visit for psychiatric evaluation. Patient only notes that he had gotten some kind of disagreement with one of the staff members there. He states that over the last couple days he has had productive cough with greenish sputum production and has been feeling short of breath. He denies having any significant fever, nausea, vomiting, chest pain, change in vision, or diarrhea symptoms. After talking to staff at [facility] it appears patient had been started on Augmentin 500-125 mg to complete a 7-day course as well as prednisone.In the emergency department, patient was noted to have oxygen saturation as low as 77%, with improvement after suctioning and his chronic trach with increase in supplemental oxygen use. Chest x-ray with suspicious for mild infiltrates as well as superimposed interstitial edema. He was given IV Lasix, continued on Augmentin and breathing treatments.  Patient to complete a total of 7 days of Augmentin and then stop it.  Patient had echocardiogram whichdid not show decreased EF.  Patient has history of bipolar disorder andschizophrenia,psych was consulted  and they were recommending outpatient referral for cognitive behavioral therapy. He will continue his psych meds and does not need any inpatient psych admission.  With diuresis, his oxygen requirements were titrated down to his chronic requirements of 4L via trach collar  Discharge Diagnoses:  Principal Problem:   Acute on chronic respiratory failure with hypoxia (HCC) Active Problems:   Schizophrenia (HCC)   Bipolar I disorder, most recent episode depressed (HCC)   Acute on chronic diastolic CHF (congestive heart failure) (HCC)   COPD with acute bronchitis (HCC)   Polycythemia   Renal insufficiency  Acute on chronic hypoxemic respiratory failure with chronic trach  HFpEF Exacerbation  COPD  Concern for CAP -Secondary to acute bronchitis and decompensated CHF -He required up to 8L of oxygen (desatted on 8 L to 86% with mobility, requiring 10 L with mobility to maintain 90% on 5/8) - at baseline on 28% FiO2 (4-5 L trach collar) per [facility] staff (they told me typically on 28% FiO2, but takes oxygen off to smoke frequently during day as well) -Chest x-ray showed interstitial lung markings with mild infiltrates with mild amount of superimposed interstitial edema - CXR 5/7 with moderate bilateral interstitial and airspace opacities, small bilateral effusions - 5/10 showing improved interstitial and airspace opacities, persistent bilateral R>L trace effusions - BNP164.1 - Procalcitonin negative - echo showed preserved EF - He was treated with IV lasix with good urine output- interstitial opacities improving -Completed Augmentin(completed 7 day course), continue breathing treatments and inhaled steroids - VQ scan negative for PE -he reports compliance with lasix at SNF -will increase lasix from 40mg  bid to 60mg  bid -currently, supplemental oxygen has been weaned back down to 4L (his baseline) and he appears comfortable and able to ambulate  Polycythemia -Peripheral smear,EPOis  elevated ->? Secondary to underlying  respiratory issues  -elevated epo level would be consistent with secondary polycythemia  History of TBI status post tracheostomy -Trach care and suctioning as needed  Paroxysmal atrial fibrillation -CHA2DS2-VASc score 4, not on anticoagulation due to history of GI bleeding -Continue aspirin, digoxin, metoprolol -In normal sinus rhythm this morning  HTN -Continue lisinopril, Toprol  History of bipolar disorder, schizophrenia -Continue Seroquel, Klonopin, Depakote, Prozac -Psychiatry consulted,recommended for outpatient referral for cognitive behavioral therapy. No recommendations to adjust his medications at this point. Does not need inpatient psych admission.  Diabetes mellitus type 2 -Hemoglobin A1c 7.0 -Continue sliding scale insulin  Discharge Instructions  Discharge Instructions    Diet - low sodium heart healthy   Complete by: As directed    Increase activity slowly   Complete by: As directed      Allergies as of 02/10/2021   No Known Allergies     Medication List    STOP taking these medications   amoxicillin-clavulanate 500-125 MG tablet Commonly known as: AUGMENTIN   predniSONE 20 MG tablet Commonly known as: DELTASONE     TAKE these medications   acetylcysteine 10% Soln Commonly known as: MUCOMYST Take 4 mLs by nebulization every 12 (twelve) hours as needed (secretions).   albuterol (2.5 MG/3ML) 0.083% nebulizer solution Commonly known as: PROVENTIL Take 2.5 mg by nebulization every 4 (four) hours as needed for wheezing or shortness of breath (cough).   albuterol 108 (90 Base) MCG/ACT inhaler Commonly known as: VENTOLIN HFA Inhale 2 puffs into the lungs every 4 (four) hours as needed for wheezing or shortness of breath.   aspirin EC 81 MG tablet Take 81 mg by mouth daily. Swallow whole.   bisacodyl 10 MG suppository Commonly known as: DULCOLAX Place 1 suppository (10 mg total) rectally daily as needed  for moderate constipation.   budesonide 0.5 MG/2ML nebulizer solution Commonly known as: PULMICORT Take 2 mLs (0.5 mg total) by nebulization 2 (two) times daily.   Clobetasol Propionate 0.025 % Crea Apply 1 application topically 2 (two) times daily. Apply to face, arm etc. For rahs   clonazePAM 0.5 MG tablet Commonly known as: KLONOPIN Take 0.5 tablets (0.25 mg total) by mouth 2 (two) times daily.   Derma-Smoothe/FS Scalp 0.01 % Oil Generic drug: Fluocinolone Acetonide Scalp Apply 1 application topically at bedtime.   digoxin 0.25 MG tablet Commonly known as: LANOXIN Take 1 tablet (0.25 mg total) by mouth daily.   divalproex 500 MG DR tablet Commonly known as: DEPAKOTE Take 500 mg by mouth 2 (two) times daily.   docusate sodium 100 MG capsule Commonly known as: COLACE Take 100 mg by mouth 2 (two) times daily.   FLUoxetine 20 MG capsule Commonly known as: PROZAC Take 1 capsule (20 mg total) by mouth daily.   furosemide 40 MG tablet Commonly known as: LASIX Take 1.5 tablets (60 mg total) by mouth 2 (two) times daily. What changed: how much to take   gabapentin 100 MG capsule Commonly known as: NEURONTIN Take 200 mg by mouth every 12 (twelve) hours.   guaiFENesin 600 MG 12 hr tablet Commonly known as: MUCINEX Take 600 mg by mouth 2 (two) times daily.   insulin glargine 100 UNIT/ML injection Commonly known as: LANTUS Inject 0.2 mLs (20 Units total) into the skin at bedtime.   ipratropium-albuterol 0.5-2.5 (3) MG/3ML Soln Commonly known as: DUONEB Take 3 mLs by nebulization in the morning and at bedtime. What changed: when to take this   ketoconazole 2 % shampoo Commonly known  as: NIZORAL Apply 1 application topically 2 (two) times daily.   lisinopril 10 MG tablet Commonly known as: ZESTRIL Take 1 tablet (10 mg total) by mouth daily.   magnesium oxide 400 (241.3 Mg) MG tablet Commonly known as: MAG-OX Place 1 tablet (400 mg total) into feeding tube 2 (two)  times daily. What changed: how to take this   metoprolol succinate 25 MG 24 hr tablet Commonly known as: Toprol XL Take 1 tablet (25 mg total) by mouth daily.   pantoprazole sodium 40 mg/20 mL Pack Commonly known as: PROTONIX Place 20 mLs (40 mg total) into feeding tube daily. What changed:   how to take this  additional instructions   QUEtiapine 200 MG tablet Commonly known as: SEROQUEL Take 1 tablet (200 mg total) by mouth at bedtime.   sodium chloride HYPERTONIC 3 % nebulizer solution Take 4 mLs by nebulization daily.       Follow-up Information    Georgann Housekeeper, MD. Schedule an appointment as soon as possible for a visit.   Specialty: Internal Medicine Contact information: [address] [place] [address] [phone]              No Known Allergies  Consultations:  Psychiatry   Procedures/Studies: DG Chest 1 View  Result Date: 02/08/2021 CLINICAL DATA:  Check tracheostomy catheter placement EXAM: CHEST  1 VIEW COMPARISON:  02/07/2021 FINDINGS: Tracheostomy tube is again identified in satisfactory position. Cardiac shadow is stable. Patchy atelectatic changes are noted in the bases bilaterally. No bony abnormality is seen. IMPRESSION: Patchy atelectatic changes in the bases. No acute abnormality noted. Electronically Signed   By: Alcide Clever M.D.   On: 02/08/2021 19:56   NM Pulmonary Perf and Vent  Result Date: 02/09/2021 CLINICAL DATA:  Respiratory failure. EXAM: NUCLEAR MEDICINE PERFUSION LUNG SCAN TECHNIQUE: Perfusion images were obtained in multiple projections after intravenous injection of radiopharmaceutical. Ventilation scans intentionally deferred if perfusion scan and chest x-ray adequate for interpretation during COVID 19 epidemic. RADIOPHARMACEUTICALS:  4.1 millicuries mCi Tc-43m MAA IV COMPARISON:  Chest x-ray Feb 08, 2021 FINDINGS: There are no segmental perfusion defects identified in the lungs. The perfusion is within  normal limits. IMPRESSION: No segmental perfusion defects identified in the lungs to suggest pulmonary embolus. No evidence of pulmonary embolus on this study. Electronically Signed   By: Gerome Sam III M.D   On: 02/09/2021 10:36   DG CHEST PORT 1 VIEW  Result Date: 02/07/2021 CLINICAL DATA:  Hypoxia.  Skin EXAM: PORTABLE CHEST 1 VIEW COMPARISON:  Chest x-ray 02/04/2021, CT chest 08/25/2019 FINDINGS: Tracheostomy tube with tip terminating 7 cm above the carina. The heart size and mediastinal contours are within normal limits. Improved inspiratory effort. Improved interstitial and airspace opacities. Likely streaky atelectasis along the right mid lung zone. Persistent bilateral, right greater than left, trace pleural effusions. No pneumothorax. Bilateral acromioclavicular degenerative changes IMPRESSION: 1. Improved interstitial and airspace opacities. Likely streaky atelectasis along the right mid lung zone. 2. Persistent bilateral, right greater than left, trace pleural effusions. Electronically Signed   By: Tish Frederickson M.D.   On: 02/07/2021 05:55   DG CHEST PORT 1 VIEW  Result Date: 02/04/2021 CLINICAL DATA:  Hypoxia. EXAM: PORTABLE CHEST 1 VIEW COMPARISON:  Chest radiograph dated 01/31/2021. FINDINGS: The heart remains enlarged. A tracheostomy tube terminates in the upper thoracic trachea. Moderate bilateral interstitial and airspace opacities appear increased since 01/31/2021. Small bilateral pleural effusions likely contribute. There is no pneumothorax Degenerative changes are seen  in the spine. IMPRESSION: Moderate bilateral interstitial and airspace opacities. Small bilateral pleural effusions likely contribute. Electronically Signed   By: Romona Curls M.D.   On: 02/04/2021 14:00   DG Chest Port 1 View  Result Date: 01/31/2021 CLINICAL DATA:  Hypoxia. EXAM: PORTABLE CHEST 1 VIEW COMPARISON:  December 19, 2019 FINDINGS: There is stable tracheostomy tube positioning. Mildly increased  interstitial lung markings are seen within the mid and lower lung fields. Mild linear atelectasis is noted within the mid right lung. Very small bilateral pleural effusions are seen. No pneumothorax is identified. The cardiac silhouette is mildly enlarged and unchanged in size. The visualized skeletal structures are unremarkable. IMPRESSION: 1. Increased interstitial lung markings within the mid and lower lung fields which is suspicious for mild infiltrates with a mild amount of superimposed interstitial edema. 2. Very small bilateral pleural effusions. Electronically Signed   By: Aram Candela M.D.   On: 01/31/2021 23:32   ECHOCARDIOGRAM COMPLETE  Result Date: 02/01/2021    ECHOCARDIOGRAM REPORT   Patient Name:   MISCHA POLLARD Date of Exam: 02/01/2021 Medical Rec #:  [id number]     Height:       72.0 in Accession #:    [id number]    Weight:       224.7 lb Date of Birth:  30-Mar-1961     BSA:          2.239 m Patient Age:    59 years      BP:           165/94 mmHg Patient Gender: M             HR:           56 bpm. Exam Location:  Inpatient Procedure: 2D Echo, Color Doppler and Cardiac Doppler Indications:    CHF-Acute Diastolic I50.31  History:        Patient has prior history of Echocardiogram examinations, most                 recent 12/14/2019. Risk Factors:Hypertension, Dyslipidemia and                 Diabetes.  Sonographer:    Eulah Pont RDCS Referring Phys: [id number] RONDELL A SMITH IMPRESSIONS  1. Left ventricular ejection fraction, by estimation, is 55%. The left ventricle has normal function. The left ventricle has no regional wall motion abnormalities. Left ventricular diastolic parameters were normal.  2. Right ventricular systolic function is normal. The right ventricular size is normal.  3. Left atrial size was mildly dilated.  4. The pericardial effusion is posterior to the left ventricle.  5. The mitral valve is normal in structure. No evidence of mitral valve regurgitation. No evidence of  mitral stenosis.  6. The aortic valve is tricuspid. There is mild calcification of the aortic valve. Aortic valve regurgitation is not visualized. Mild aortic valve sclerosis is present, with no evidence of aortic valve stenosis.  7. The inferior vena cava is normal in size with greater than 50% respiratory variability, suggesting right atrial pressure of 3 mmHg. FINDINGS  Left Ventricle: Left ventricular ejection fraction, by estimation, is 55%. The left ventricle has normal function. The left ventricle has no regional wall motion abnormalities. The left ventricular internal cavity size was normal in size. There is no left ventricular hypertrophy. Left ventricular diastolic parameters were normal. Right Ventricle: The right ventricular size is normal. No increase in right ventricular wall thickness. Right ventricular systolic function is normal. Left Atrium:  Left atrial size was mildly dilated. Right Atrium: Right atrial size was normal in size. Pericardium: Trivial pericardial effusion is present. The pericardial effusion is posterior to the left ventricle. Mitral Valve: The mitral valve is normal in structure. No evidence of mitral valve regurgitation. No evidence of mitral valve stenosis. Tricuspid Valve: The tricuspid valve is normal in structure. Tricuspid valve regurgitation is trivial. No evidence of tricuspid stenosis. Aortic Valve: The aortic valve is tricuspid. There is mild calcification of the aortic valve. Aortic valve regurgitation is not visualized. Mild aortic valve sclerosis is present, with no evidence of aortic valve stenosis. Pulmonic Valve: The pulmonic valve was normal in structure. Pulmonic valve regurgitation is trivial. No evidence of pulmonic stenosis. Aorta: The aortic root is normal in size and structure. Venous: The inferior vena cava is normal in size with greater than 50% respiratory variability, suggesting right atrial pressure of 3 mmHg. IAS/Shunts: No atrial level shunt detected by  color flow Doppler.  LEFT VENTRICLE PLAX 2D LVIDd:         4.80 cm  Diastology LVIDs:         3.20 cm  LV e' medial:    8.38 cm/s LV PW:         0.90 cm  LV E/e' medial:  12.2 LV IVS:        0.90 cm  LV e' lateral:   7.49 cm/s LVOT diam:     1.90 cm  LV E/e' lateral: 13.6 LV SV:         78 LV SV Index:   35 LVOT Area:     2.84 cm  RIGHT VENTRICLE RV S prime:     10.50 cm/s TAPSE (M-mode): 2.2 cm LEFT ATRIUM             Index       RIGHT ATRIUM           Index LA diam:        3.80 cm 1.70 cm/m  RA Area:     15.80 cm LA Vol (A2C):   38.5 ml 17.20 ml/m RA Volume:   43.50 ml  19.43 ml/m LA Vol (A4C):   38.1 ml 17.02 ml/m LA Biplane Vol: 38.9 ml 17.38 ml/m  AORTIC VALVE LVOT Vmax:   122.00 cm/s LVOT Vmean:  79.600 cm/s LVOT VTI:    0.276 m  AORTA Ao Root diam: 2.80 cm Ao Asc diam:  3.10 cm MITRAL VALVE MV Area (PHT): 3.39 cm     SHUNTS MV Decel Time: 224 msec     Systemic VTI:  0.28 m MV E velocity: 102.00 cm/s  Systemic Diam: 1.90 cm MV A velocity: 68.10 cm/s MV E/A ratio:  1.50 Charlton Haws MD Electronically signed by Charlton Haws MD Signature Date/Time: 02/01/2021/4:56:50 PM    Final        Subjective: Feeling better, shortness of breath improved, able to ambulate without difficulty  Discharge Exam: Vitals:   02/10/21 0415 02/10/21 0742 02/10/21 0800 02/10/21 1030  BP:  131/78  125/77  Pulse: 74 75  78  Resp: 18 17    Temp:  98.3 F (36.8 C)    TempSrc:  Oral    SpO2: 90% 90% (!) 89% 92%  Weight:      Height:        General: Pt is alert, awake, not in acute distress Cardiovascular: RRR, S1/S2 +, no rubs, no gallops Respiratory: CTA bilaterally, no wheezing, no rhonchi, tracheostomy in place Abdominal: Soft, NT,  ND, bowel sounds + Extremities: no edema, no cyanosis    The results of significant diagnostics from this hospitalization (including imaging, microbiology, ancillary and laboratory) are listed below for reference.     Microbiology: Recent Results (from the past 240  hour(s))  Resp Panel by RT-PCR (Flu A&B, Covid) Nasopharyngeal Swab     Status: None   Collection Time: 02/01/21 12:29 AM   Specimen: Nasopharyngeal Swab; Nasopharyngeal(NP) swabs in vial transport medium  Result Value Ref Range Status   SARS Coronavirus 2 by RT PCR NEGATIVE NEGATIVE Final    Comment: (NOTE) SARS-CoV-2 target nucleic acids are NOT DETECTED.  The SARS-CoV-2 RNA is generally detectable in upper respiratory specimens during the acute phase of infection. The lowest concentration of SARS-CoV-2 viral copies this assay can detect is 138 copies/mL. A negative result does not preclude SARS-Cov-2 infection and should not be used as the sole basis for treatment or other patient management decisions. A negative result may occur with  improper specimen collection/handling, submission of specimen other than nasopharyngeal swab, presence of viral mutation(s) within the areas targeted by this assay, and inadequate number of viral copies(<138 copies/mL). A negative result must be combined with clinical observations, patient history, and epidemiological information. The expected result is Negative.  Fact Sheet for Patients:  BloggerCourse.com  Fact Sheet for Healthcare Providers:  SeriousBroker.it  This test is no t yet approved or cleared by the [place] FDA and  has been authorized for detection and/or diagnosis of SARS-CoV-2 by FDA under an Emergency Use Authorization (EUA). This EUA will remain  in effect (meaning this test can be used) for the duration of the COVID-19 declaration under Section 564(b)(1) of the Act, 21 U.S.C.section 360bbb-3(b)(1), unless the authorization is terminated  or revoked sooner.       Influenza A by PCR NEGATIVE NEGATIVE Final   Influenza B by PCR NEGATIVE NEGATIVE Final    Comment: (NOTE) The Xpert Xpress SARS-CoV-2/FLU/RSV plus assay is intended as an aid in the diagnosis of influenza from  Nasopharyngeal swab specimens and should not be used as a sole basis for treatment. Nasal washings and aspirates are unacceptable for Xpert Xpress SARS-CoV-2/FLU/RSV testing.  Fact Sheet for Patients: BloggerCourse.com  Fact Sheet for Healthcare Providers: SeriousBroker.it  This test is not yet approved or cleared by the [place] FDA and has been authorized for detection and/or diagnosis of SARS-CoV-2 by FDA under an Emergency Use Authorization (EUA). This EUA will remain in effect (meaning this test can be used) for the duration of the COVID-19 declaration under Section 564(b)(1) of the Act, 21 U.S.C. section 360bbb-3(b)(1), unless the authorization is terminated or revoked.  Performed at [facility], [address] [place], [place] [address]   SARS CORONAVIRUS 2 (TAT 6-24 HRS) Nasopharyngeal Nasopharyngeal Swab     Status: None   Collection Time: 02/03/21 11:20 AM   Specimen: Nasopharyngeal Swab  Result Value Ref Range Status   SARS Coronavirus 2 NEGATIVE NEGATIVE Final    Comment: (NOTE) SARS-CoV-2 target nucleic acids are NOT DETECTED.  The SARS-CoV-2 RNA is generally detectable in upper and lower respiratory specimens during the acute phase of infection. Negative results do not preclude SARS-CoV-2 infection, do not rule out co-infections with other pathogens, and should not be used as the sole basis for treatment or other patient management decisions. Negative results must be combined with clinical observations, patient history, and epidemiological information. The expected result is Negative.  Fact Sheet for Patients: HairSlick.no  Fact Sheet  for Healthcare Providers: quierodirigir.com  This test is not yet approved or cleared by the [place] and  has been authorized for detection and/or diagnosis of SARS-CoV-2 by FDA under an Emergency Use  Authorization (EUA). This EUA will remain  in effect (meaning this test can be used) for the duration of the COVID-19 declaration under Se ction 564(b)(1) of the Act, 21 U.S.C. section 360bbb-3(b)(1), unless the authorization is terminated or revoked sooner.  Performed at [facility], [address] [place], [place] [address]      Labs: BNP (last 3 results) Recent Labs    01/31/21 2318 02/06/21 1215  BNP 164.1* 28.6   Basic Metabolic Panel: Recent Labs  Lab 02/05/21 0058 02/06/21 0728 02/07/21 0137 02/08/21 0113 02/09/21 0143 02/10/21 0151  NA 134* 136 133* 133* 137 134*  K 4.4 4.1 4.3 4.8 3.9 4.7  CL 97* 95* 95* 92* 94* 93*  CO2 31 32 29 30 33* 32  GLUCOSE 181* 189* 194* 238* 146* 223*  BUN 20 22* 27* 29* 22* 25*  CREATININE 1.08 1.10 1.15 1.29* 1.08 1.13  CALCIUM 8.8* 8.7* 8.5* 8.5* 8.6* 8.4*  MG 2.0 2.1 2.2 2.2  --   --   PHOS 3.2 3.4 2.8 1.9* 4.0  --    Liver Function Tests: Recent Labs  Lab 02/04/21 0803 02/05/21 0058 02/06/21 0728 02/07/21 0137 02/08/21 0113 02/09/21 0143  AST 15 18 24 26  36  --   ALT 11 11 14 17 17   --   ALKPHOS 49 52 53 57 60  --   BILITOT 0.7 0.4 0.8 0.6 1.0  --   PROT 5.8* 6.2* 5.8* 6.3* 5.8*  --   ALBUMIN 2.6* 2.7* 2.6* 2.7* 2.8* 2.6*   No results for input(s): LIPASE, AMYLASE in the last 168 hours. No results for input(s): AMMONIA in the last 168 hours. CBC: Recent Labs  Lab 02/04/21 0803 02/05/21 0058 02/06/21 0437 02/07/21 0137 02/08/21 0113  WBC 8.9 9.9 11.4* 10.6* 10.6*  NEUTROABS 5.0 6.7 7.5 7.0 7.5  HGB 18.4* 18.4* 18.7* 19.2* 18.8*  HCT 62.5* 61.5* 63.0* 63.9* 62.4*  MCV 84.7 84.0 84.1 84.0 83.6  PLT 155 171 170 158 142*   Cardiac Enzymes: No results for input(s): CKTOTAL, CKMB, CKMBINDEX, TROPONINI in the last 168 hours. BNP: Invalid input(s): POCBNP CBG: Recent Labs  Lab 02/09/21 1114 02/09/21 1731 02/09/21 2135 02/10/21 0632 02/10/21 1052  GLUCAP 219* 254* 344* 232* 342*   D-Dimer No  results for input(s): DDIMER in the last 72 hours. Hgb A1c No results for input(s): HGBA1C in the last 72 hours. Lipid Profile No results for input(s): CHOL, HDL, LDLCALC, TRIG, CHOLHDL, LDLDIRECT in the last 72 hours. Thyroid function studies No results for input(s): TSH, T4TOTAL, T3FREE, THYROIDAB in the last 72 hours.  Invalid input(s): FREET3 Anemia work up No results for input(s): VITAMINB12, FOLATE, FERRITIN, TIBC, IRON, RETICCTPCT in the last 72 hours. Urinalysis    Component Value Date/Time   COLORURINE YELLOW 12/14/2019 0430   APPEARANCEUR CLEAR 12/14/2019 0430   LABSPEC 1.029 12/14/2019 0430   PHURINE 5.0 12/14/2019 0430   GLUCOSEU NEGATIVE 12/14/2019 0430   HGBUR NEGATIVE 12/14/2019 0430   BILIRUBINUR SMALL (A) 12/14/2019 0430   KETONESUR 5 (A) 12/14/2019 0430   PROTEINUR 30 (A) 12/14/2019 0430   NITRITE NEGATIVE 12/14/2019 0430   LEUKOCYTESUR TRACE (A) 12/14/2019 0430   Sepsis Labs Invalid input(s): PROCALCITONIN,  WBC,  LACTICIDVEN Microbiology Recent Results (from the past 240 hour(s))  Resp Panel by RT-PCR (Flu A&B, Covid) Nasopharyngeal Swab     Status: None   Collection Time: 02/01/21 12:29 AM   Specimen: Nasopharyngeal Swab; Nasopharyngeal(NP) swabs in vial transport medium  Result Value Ref Range Status   SARS Coronavirus 2 by RT PCR NEGATIVE NEGATIVE Final    Comment: (NOTE) SARS-CoV-2 target nucleic acids are NOT DETECTED.  The SARS-CoV-2 RNA is generally detectable in upper respiratory specimens during the acute phase of infection. The lowest concentration of SARS-CoV-2 viral copies this assay can detect is 138 copies/mL. A negative result does not preclude SARS-Cov-2 infection and should not be used as the sole basis for treatment or other patient management decisions. A negative result may occur with  improper specimen collection/handling, submission of specimen other than nasopharyngeal swab, presence of viral mutation(s) within the areas  targeted by this assay, and inadequate number of viral copies(<138 copies/mL). A negative result must be combined with clinical observations, patient history, and epidemiological information. The expected result is Negative.  Fact Sheet for Patients:  BloggerCourse.com  Fact Sheet for Healthcare Providers:  SeriousBroker.it  This test is no t yet approved or cleared by the [place] FDA and  has been authorized for detection and/or diagnosis of SARS-CoV-2 by FDA under an Emergency Use Authorization (EUA). This EUA will remain  in effect (meaning this test can be used) for the duration of the COVID-19 declaration under Section 564(b)(1) of the Act, 21 U.S.C.section 360bbb-3(b)(1), unless the authorization is terminated  or revoked sooner.       Influenza A by PCR NEGATIVE NEGATIVE Final   Influenza B by PCR NEGATIVE NEGATIVE Final    Comment: (NOTE) The Xpert Xpress SARS-CoV-2/FLU/RSV plus assay is intended as an aid in the diagnosis of influenza from Nasopharyngeal swab specimens and should not be used as a sole basis for treatment. Nasal washings and aspirates are unacceptable for Xpert Xpress SARS-CoV-2/FLU/RSV testing.  Fact Sheet for Patients: BloggerCourse.com  Fact Sheet for Healthcare Providers: SeriousBroker.it  This test is not yet approved or cleared by the [place] FDA and has been authorized for detection and/or diagnosis of SARS-CoV-2 by FDA under an Emergency Use Authorization (EUA). This EUA will remain in effect (meaning this test can be used) for the duration of the COVID-19 declaration under Section 564(b)(1) of the Act, 21 U.S.C. section 360bbb-3(b)(1), unless the authorization is terminated or revoked.  Performed at [facility], [address], [place], [place] [address]   SARS CORONAVIRUS 2 (TAT 6-24 HRS) Nasopharyngeal Nasopharyngeal  Swab     Status: None   Collection Time: 02/03/21 11:20 AM   Specimen: Nasopharyngeal Swab  Result Value Ref Range Status   SARS Coronavirus 2 NEGATIVE NEGATIVE Final    Comment: (NOTE) SARS-CoV-2 target nucleic acids are NOT DETECTED.  The SARS-CoV-2 RNA is generally detectable in upper and lower respiratory specimens during the acute phase of infection. Negative results do not preclude SARS-CoV-2 infection, do not rule out co-infections with other pathogens, and should not be used as the sole basis for treatment or other patient management decisions. Negative results must be combined with clinical observations, patient history, and epidemiological information. The expected result is Negative.  Fact Sheet for Patients: HairSlick.no  Fact Sheet for Healthcare Providers: quierodirigir.com  This test is not yet approved or cleared by the [place] FDA and  has been authorized for detection and/or diagnosis of SARS-CoV-2 by FDA under an Emergency Use Authorization (EUA). This EUA will remain  in effect (  meaning this test can be used) for the duration of the COVID-19 declaration under Se ction 564(b)(1) of the Act, 21 U.S.C. section 360bbb-3(b)(1), unless the authorization is terminated or revoked sooner.  Performed at [facility], [address] [place], [place] [address]      Time coordinating discharge: 35mins  SIGNED:   Erick BlinksJehanzeb Shailyn Weyandt, MD  [facility] 02/10/2021, 11:55 AM   If 7PM-7AM, please contact night-coverage [email or web address]

## 2021-02-10 NOTE — Progress Notes (Signed)
Mobility Specialist: Progress Note   02/10/21 1133  Mobility  Activity Ambulated in room  Level of Assistance Minimal assist, patient does 75% or more  Assistive Device Front wheel walker  Distance Ambulated (ft) 6 ft (3'x2)  Mobility Ambulated with assistance in room  Mobility Response Tolerated well  Mobility performed by Mobility specialist  $Mobility charge 1 Mobility   Post-Mobility: 85 HR, 98% SpO2  Pt on 4 L on trach collar during session. Pt able to ambulate to the sink and back x1, slightly unsteady when walking back to bed. Pt performed 2 sets of three and five sit to stands as well. Pt was contact guard to minA for STS. Pt back to bed after session with bed alarm on.   [facility] Lacey Wallman Mobility Specialist Mobility Specialist Phone: [phone]

## 2021-02-10 NOTE — TOC Transition Note (Signed)
Transition of Care [facility]) - CM/SW Discharge Note   Patient Details  Name: Tommy Mathews MRN: [id number] Date of Birth: [birth year]  [facility] CM/SW Contact:  Eduard Roux, LCSW Phone Number: 02/10/2021, 3:22 PM   Clinical Narrative:     Patient will Discharge to: [facility]  Discharge Date: 02/10/2021 Family Notified: Lezlie Lye [facility]  Per MD patient is ready for discharge. RN, patient, and facility notified of discharge. Discharge Summary sent to facility. RN given number for [phone]. Ambulance transport requested for patient.   Clinical Social Worker signing off.  Antony Blackbird, MSW, LCSW Clinical Social Worker    Final next level of care: Skilled Nursing Facility Barriers to Discharge: Barriers Resolved   Patient Goals and CMS Choice        Discharge Placement              Patient chooses bed at: [facility] Patient to be transferred to facility by: [facility] Name of family member notified: Lanora Manis, mother- left voice message Patient and family notified of of transfer: 02/03/21  Discharge Plan and Services In-house Referral: Clinical Social Work                                   Social Determinants of Health (SDOH) Interventions     Readmission Risk Interventions No flowsheet data found.

## 2021-02-10 NOTE — Discharge Summary (Addendum)
Physician Discharge Summary  Dalessandro Baldyga ZOX:[id number] DOB: [birth year] DOA: 01/31/2021  PCP: Georgann Housekeeper, MD  Admit date: 01/31/2021 Discharge date: 02/11/2021  Admitted From: [facility] Disposition:  [facility]  Recommendations for Outpatient Follow-up:  1. Follow up with PCP in 1-2 weeks 2. Please obtain BMP/CBC in one week  Discharge Condition:stable CODE STATUS:full code Diet recommendation: heart healthy, carb modified  Brief/Interim Summary: Tommy Lindseyis a 60 y.o.malewith medical history significant ofHTN, DM type II, systolic CHF, paroxysmal atrial fibrillation not on anticoagulation2/2history of GI bleeds, COPD,TBIs/ptracheostomy, bipolar disorder, schizophrenia, and GERD presenting after patient reported to have been harassing staff memberand thephysician in charge recommended ER visit for psychiatric evaluation. Patient only notes that he had gotten some kind of disagreement with one of the staff members there. He states that over the last couple days he has had productive cough with greenish sputum production and has been feeling short of breath. He denies having any significant fever, nausea, vomiting, chest pain, change in vision, or diarrhea symptoms. After talking to staff at [facility] it appears patient had been started on Augmentin 500-125 mg to complete a 7-day course as well as prednisone.In the emergency department, patient was noted to have oxygen saturation as low as 77%, with improvement after suctioning and his chronic trach with increase in supplemental oxygen use. Chest x-ray with suspicious for mild infiltrates as well as superimposed interstitial edema. He was given IV Lasix, continued on Augmentin and breathing treatments.  Patient to complete a total of 7 days of Augmentin and then stop it.  Patient had echocardiogram whichdid not show decreased EF.  Patient has history of bipolar disorder andschizophrenia,psych was consulted  and they were recommending outpatient referral for cognitive behavioral therapy. He will continue his psych meds and does not need any inpatient psych admission.  With diuresis, his oxygen requirements were titrated down to his chronic requirements of 4L via trach collar  Discharge Diagnoses:  Principal Problem:   Acute on chronic respiratory failure with hypoxia (HCC) Active Problems:   Schizophrenia (HCC)   Bipolar I disorder, most recent episode depressed (HCC)   Acute on chronic diastolic CHF (congestive heart failure) (HCC)   COPD with acute bronchitis (HCC)   Polycythemia   Renal insufficiency  Acute on chronic hypoxemic respiratory failure with chronic trach  HFpEF Exacerbation  COPD  Concern for CAP -Secondary to acute bronchitis and decompensated CHF -He required up to 8L of oxygen (desatted on 8 L to 86% with mobility, requiring 10 L with mobility to maintain 90% on 5/8) - at baseline on 28% FiO2 (4-5 L trach collar) per [facility] staff (they told me typically on 28% FiO2, but takes oxygen off to smoke frequently during day as well) -Chest x-ray showed interstitial lung markings with mild infiltrates with mild amount of superimposed interstitial edema - CXR 5/7 with moderate bilateral interstitial and airspace opacities, small bilateral effusions - 5/10 showing improved interstitial and airspace opacities, persistent bilateral R>L trace effusions - BNP164.1 - Procalcitonin negative - echo showed preserved EF - He was treated with IV lasix with good urine output- interstitial opacities improving -Completed Augmentin(completed 7 day course), continue breathing treatments and inhaled steroids - VQ scan negative for PE -he reports compliance with lasix at SNF -will increase lasix from  bid to  bid -currently, supplemental oxygen has been weaned back down to 4L (his baseline) and he appears comfortable and able to ambulate  Polycythemia -Peripheral smear,EPOis  elevated ->? Secondary to underlying  respiratory issues  -elevated epo level would be consistent with secondary polycythemia  History of TBI status post tracheostomy -Trach care and suctioning as needed  Paroxysmal atrial fibrillation -CHA2DS2-VASc score 4, not on anticoagulation due to history of GI bleeding -Continue aspirin, digoxin, metoprolol -In normal sinus rhythm this morning  HTN -Continue lisinopril, Toprol  History of bipolar disorder, schizophrenia -Continue Seroquel, Klonopin, Depakote, Prozac -Psychiatry consulted,recommended for outpatient referral for cognitive behavioral therapy. No recommendations to adjust his medications at this point. Does not need inpatient psych admission.  Diabetes mellitus type 2 -Hemoglobin A1c 7.0 -Continue sliding scale insulin  Discharge Instructions  Discharge Instructions    Diet - low sodium heart healthy   Complete by: As directed    Diet - low sodium heart healthy   Complete by: As directed    Increase activity slowly   Complete by: As directed    Increase activity slowly   Complete by: As directed      Allergies as of 02/11/2021   No Known Allergies     Medication List    STOP taking these medications   amoxicillin-clavulanate 500-125 MG tablet Commonly known as: AUGMENTIN   magnesium oxide 400 (241.3 Mg) MG tablet Commonly known as: MAG-OX   pantoprazole sodium 40 mg/20 mL Pack Commonly known as: PROTONIX Replaced by: pantoprazole 40 MG tablet   predniSONE 20 MG tablet Commonly known as: DELTASONE     TAKE these medications   acetylcysteine 10% Soln Commonly known as: MUCOMYST Take 4 mLs by nebulization every 12 (twelve) hours as needed (secretions).   albuterol (2.5 MG/3ML) 0.083% nebulizer solution Commonly known as: PROVENTIL Take 2.5 mg by nebulization every 4 (four) hours as needed for wheezing or shortness of breath (cough).   albuterol 108 (90 Base) MCG/ACT inhaler Commonly known as:  VENTOLIN HFA Inhale 2 puffs into the lungs every 4 (four) hours as needed for wheezing or shortness of breath.   aspirin EC 81 MG tablet Take 81 mg by mouth daily. Swallow whole.   bisacodyl 10 MG suppository Commonly known as: DULCOLAX Place 1 suppository (10 mg total) rectally daily as needed for moderate constipation.   budesonide 0.5 MG/2ML nebulizer solution Commonly known as: PULMICORT Take 2 mLs (0.5 mg total) by nebulization 2 (two) times daily.   Clobetasol Propionate 0.025 % Crea Apply 1 application topically 2 (two) times daily. Apply to face, arm etc. For rahs   clonazePAM 0.5 MG tablet Commonly known as: KLONOPIN Take 0.5 tablets (0.25 mg total) by mouth 2 (two) times daily.   Derma-Smoothe/FS Scalp 0.01 % Oil Generic drug: Fluocinolone Acetonide Scalp Apply 1 application topically at bedtime.   digoxin 0.25 MG tablet Commonly known as: LANOXIN Take 1 tablet (0.25 mg total) by mouth daily.   divalproex 500 MG DR tablet Commonly known as: DEPAKOTE Take 500 mg by mouth 2 (two) times daily.   docusate sodium 100 MG capsule Commonly known as: COLACE Take 100 mg by mouth 2 (two) times daily.   FLUoxetine 20 MG capsule Commonly known as: PROZAC Take 1 capsule (20 mg total) by mouth daily.   furosemide 40 MG tablet Commonly known as: LASIX Take 1.5 tablets (60 mg total) by mouth 2 (two) times daily. What changed: how much to take   gabapentin 100 MG capsule Commonly known as: NEURONTIN Take 200 mg by mouth every 12 (twelve) hours.   guaiFENesin 600 MG 12 hr tablet Commonly known as: MUCINEX Take 600 mg by mouth 2 (two) times daily.  insulin glargine 100 UNIT/ML injection Commonly known as: LANTUS Inject 0.2 mLs (20 Units total) into the skin at bedtime.   ipratropium-albuterol 0.5-2.5 (3) MG/3ML Soln Commonly known as: DUONEB Take 3 mLs by nebulization in the morning and at bedtime. What changed: when to take this   ketoconazole 2 %  shampoo Commonly known as: NIZORAL Apply 1 application topically 2 (two) times daily.   lisinopril 10 MG tablet Commonly known as: ZESTRIL Take 1 tablet (10 mg total) by mouth daily.   Magnesium Oxide 400 MG Caps Take 1 capsule (400 mg total) by mouth 2 (two) times daily.   metoprolol succinate 25 MG 24 hr tablet Commonly known as: Toprol XL Take 1 tablet (25 mg total) by mouth daily.   pantoprazole 40 MG tablet Commonly known as: Protonix Take 1 tablet (40 mg total) by mouth daily. Replaces: pantoprazole sodium 40 mg/20 mL Pack   QUEtiapine 200 MG tablet Commonly known as: SEROQUEL Take 1 tablet (200 mg total) by mouth at bedtime.   sodium chloride HYPERTONIC 3 % nebulizer solution Take 4 mLs by nebulization daily.       Follow-up Information    Georgann HousekeeperHusain, Karrar, MD. Schedule an appointment as soon as possible for a visit.   Specialty: Internal Medicine Contact information: [address] [place] [address] [phone]              No Known Allergies  Consultations:  Psychiatry   Procedures/Studies: DG Chest 1 View  Result Date: 02/08/2021 CLINICAL DATA:  Check tracheostomy catheter placement EXAM: CHEST  1 VIEW COMPARISON:  02/07/2021 FINDINGS: Tracheostomy tube is again identified in satisfactory position. Cardiac shadow is stable. Patchy atelectatic changes are noted in the bases bilaterally. No bony abnormality is seen. IMPRESSION: Patchy atelectatic changes in the bases. No acute abnormality noted. Electronically Signed   By: Alcide CleverMark  Lukens M.D.   On: 02/08/2021 19:56   NM Pulmonary Perf and Vent  Result Date: 02/09/2021 CLINICAL DATA:  Respiratory failure. EXAM: NUCLEAR MEDICINE PERFUSION LUNG SCAN TECHNIQUE: Perfusion images were obtained in multiple projections after intravenous injection of radiopharmaceutical. Ventilation scans intentionally deferred if perfusion scan and chest x-ray adequate for interpretation during COVID 19  epidemic. RADIOPHARMACEUTICALS:  4.1 millicuries mCi Tc-1280m MAA IV COMPARISON:  Chest x-ray Feb 08, 2021 FINDINGS: There are no segmental perfusion defects identified in the lungs. The perfusion is within normal limits. IMPRESSION: No segmental perfusion defects identified in the lungs to suggest pulmonary embolus. No evidence of pulmonary embolus on this study. Electronically Signed   By: Gerome Samavid  Williams III M.D   On: 02/09/2021 10:36   DG CHEST PORT 1 VIEW  Result Date: 02/07/2021 CLINICAL DATA:  Hypoxia.  Skin EXAM: PORTABLE CHEST 1 VIEW COMPARISON:  Chest x-ray 02/04/2021, CT chest 08/25/2019 FINDINGS: Tracheostomy tube with tip terminating 7 cm above the carina. The heart size and mediastinal contours are within normal limits. Improved inspiratory effort. Improved interstitial and airspace opacities. Likely streaky atelectasis along the right mid lung zone. Persistent bilateral, right greater than left, trace pleural effusions. No pneumothorax. Bilateral acromioclavicular degenerative changes IMPRESSION: 1. Improved interstitial and airspace opacities. Likely streaky atelectasis along the right mid lung zone. 2. Persistent bilateral, right greater than left, trace pleural effusions. Electronically Signed   By: Tish FredericksonMorgane  Naveau M.D.   On: 02/07/2021 05:55   DG CHEST PORT 1 VIEW  Result Date: 02/04/2021 CLINICAL DATA:  Hypoxia. EXAM: PORTABLE CHEST 1 VIEW COMPARISON:  Chest radiograph dated 01/31/2021. FINDINGS: The  heart remains enlarged. A tracheostomy tube terminates in the upper thoracic trachea. Moderate bilateral interstitial and airspace opacities appear increased since 01/31/2021. Small bilateral pleural effusions likely contribute. There is no pneumothorax Degenerative changes are seen in the spine. IMPRESSION: Moderate bilateral interstitial and airspace opacities. Small bilateral pleural effusions likely contribute. Electronically Signed   By: Romona Curls M.D.   On: 02/04/2021 14:00   DG  Chest Port 1 View  Result Date: 01/31/2021 CLINICAL DATA:  Hypoxia. EXAM: PORTABLE CHEST 1 VIEW COMPARISON:  December 19, 2019 FINDINGS: There is stable tracheostomy tube positioning. Mildly increased interstitial lung markings are seen within the mid and lower lung fields. Mild linear atelectasis is noted within the mid right lung. Very small bilateral pleural effusions are seen. No pneumothorax is identified. The cardiac silhouette is mildly enlarged and unchanged in size. The visualized skeletal structures are unremarkable. IMPRESSION: 1. Increased interstitial lung markings within the mid and lower lung fields which is suspicious for mild infiltrates with a mild amount of superimposed interstitial edema. 2. Very small bilateral pleural effusions. Electronically Signed   By: Aram Candela M.D.   On: 01/31/2021 23:32   ECHOCARDIOGRAM COMPLETE  Result Date: 02/01/2021    ECHOCARDIOGRAM REPORT   Patient Name:   GERGORY BIELLO Date of Exam: 02/01/2021 Medical Rec #:  [id number]     Height:       72.0 in Accession #:    [id number]    Weight:       224.7 lb Date of Birth:  1961-03-06     BSA:          2.239 m Patient Age:    59 years      BP:           165/94 mmHg Patient Gender: M             HR:           56 bpm. Exam Location:  Inpatient Procedure: 2D Echo, Color Doppler and Cardiac Doppler Indications:    CHF-Acute Diastolic I50.31  History:        Patient has prior history of Echocardiogram examinations, most                 recent 12/14/2019. Risk Factors:Hypertension, Dyslipidemia and                 Diabetes.  Sonographer:    Eulah Pont RDCS Referring Phys: [id number] RONDELL A SMITH IMPRESSIONS  1. Left ventricular ejection fraction, by estimation, is 55%. The left ventricle has normal function. The left ventricle has no regional wall motion abnormalities. Left ventricular diastolic parameters were normal.  2. Right ventricular systolic function is normal. The right ventricular size is normal.  3. Left atrial  size was mildly dilated.  4. The pericardial effusion is posterior to the left ventricle.  5. The mitral valve is normal in structure. No evidence of mitral valve regurgitation. No evidence of mitral stenosis.  6. The aortic valve is tricuspid. There is mild calcification of the aortic valve. Aortic valve regurgitation is not visualized. Mild aortic valve sclerosis is present, with no evidence of aortic valve stenosis.  7. The inferior vena cava is normal in size with greater than 50% respiratory variability, suggesting right atrial pressure of 3 mmHg. FINDINGS  Left Ventricle: Left ventricular ejection fraction, by estimation, is 55%. The left ventricle has normal function. The left ventricle has no regional wall motion abnormalities. The left ventricular internal cavity size was normal in  size. There is no left ventricular hypertrophy. Left ventricular diastolic parameters were normal. Right Ventricle: The right ventricular size is normal. No increase in right ventricular wall thickness. Right ventricular systolic function is normal. Left Atrium: Left atrial size was mildly dilated. Right Atrium: Right atrial size was normal in size. Pericardium: Trivial pericardial effusion is present. The pericardial effusion is posterior to the left ventricle. Mitral Valve: The mitral valve is normal in structure. No evidence of mitral valve regurgitation. No evidence of mitral valve stenosis. Tricuspid Valve: The tricuspid valve is normal in structure. Tricuspid valve regurgitation is trivial. No evidence of tricuspid stenosis. Aortic Valve: The aortic valve is tricuspid. There is mild calcification of the aortic valve. Aortic valve regurgitation is not visualized. Mild aortic valve sclerosis is present, with no evidence of aortic valve stenosis. Pulmonic Valve: The pulmonic valve was normal in structure. Pulmonic valve regurgitation is trivial. No evidence of pulmonic stenosis. Aorta: The aortic root is normal in size and  structure. Venous: The inferior vena cava is normal in size with greater than 50% respiratory variability, suggesting right atrial pressure of 3 mmHg. IAS/Shunts: No atrial level shunt detected by color flow Doppler.  LEFT VENTRICLE PLAX 2D LVIDd:         4.80 cm  Diastology LVIDs:         3.20 cm  LV e' medial:    8.38 cm/s LV PW:         0.90 cm  LV E/e' medial:  12.2 LV IVS:        0.90 cm  LV e' lateral:   7.49 cm/s LVOT diam:     1.90 cm  LV E/e' lateral: 13.6 LV SV:         78 LV SV Index:   35 LVOT Area:     2.84 cm  RIGHT VENTRICLE RV S prime:     10.50 cm/s TAPSE (M-mode): 2.2 cm LEFT ATRIUM             Index       RIGHT ATRIUM           Index LA diam:        3.80 cm 1.70 cm/m  RA Area:     15.80 cm LA Vol (A2C):   38.5 ml 17.20 ml/m RA Volume:   43.50 ml  19.43 ml/m LA Vol (A4C):   38.1 ml 17.02 ml/m LA Biplane Vol: 38.9 ml 17.38 ml/m  AORTIC VALVE LVOT Vmax:   122.00 cm/s LVOT Vmean:  79.600 cm/s LVOT VTI:    0.276 m  AORTA Ao Root diam: 2.80 cm Ao Asc diam:  3.10 cm MITRAL VALVE MV Area (PHT): 3.39 cm     SHUNTS MV Decel Time: 224 msec     Systemic VTI:  0.28 m MV E velocity: 102.00 cm/s  Systemic Diam: 1.90 cm MV A velocity: 68.10 cm/s MV E/A ratio:  1.50 Charlton Haws MD Electronically signed by Charlton Haws MD Signature Date/Time: 02/01/2021/4:56:50 PM    Final       Subjective: Feeling better, shortness of breath improved, able to ambulate without difficulty  Discharge Exam: Vitals:   02/11/21 0742 02/11/21 0839 02/11/21 0843 02/11/21 1023  BP: 113/75   (!) 150/93  Pulse: 71  69 75  Resp: 16  18 20   Temp: 98.2 F (36.8 C)     TempSrc: Oral     SpO2: 96% 98% 99% 93%  Weight:      Height:  General: Pt is alert, awake, not in acute distress Cardiovascular: RRR, S1/S2 +, no rubs, no gallops Respiratory: CTA bilaterally, no wheezing, no rhonchi, tracheostomy in place Abdominal: Soft, NT, ND, bowel sounds + Extremities: no edema, no cyanosis    The results of  significant diagnostics from this hospitalization (including imaging, microbiology, ancillary and laboratory) are listed below for reference.     Microbiology: Recent Results (from the past 240 hour(s))  SARS CORONAVIRUS 2 (TAT 6-24 HRS) Nasopharyngeal Nasopharyngeal Swab     Status: None   Collection Time: 02/03/21 11:20 AM   Specimen: Nasopharyngeal Swab  Result Value Ref Range Status   SARS Coronavirus 2 NEGATIVE NEGATIVE Final    Comment: (NOTE) SARS-CoV-2 target nucleic acids are NOT DETECTED.  The SARS-CoV-2 RNA is generally detectable in upper and lower respiratory specimens during the acute phase of infection. Negative results do not preclude SARS-CoV-2 infection, do not rule out co-infections with other pathogens, and should not be used as the sole basis for treatment or other patient management decisions. Negative results must be combined with clinical observations, patient history, and epidemiological information. The expected result is Negative.  Fact Sheet for Patients: HairSlick.no  Fact Sheet for Healthcare Providers: quierodirigir.com  This test is not yet approved or cleared by the [place] FDA and  has been authorized for detection and/or diagnosis of SARS-CoV-2 by FDA under an Emergency Use Authorization (EUA). This EUA will remain  in effect (meaning this test can be used) for the duration of the COVID-19 declaration under Se ction 564(b)(1) of the Act, 21 U.S.C. section 360bbb-3(b)(1), unless the authorization is terminated or revoked sooner.  Performed at [facility], [address] [place], [place] [address]   Resp Panel by RT-PCR (Flu A&B, Covid) Nasopharyngeal Swab     Status: None   Collection Time: 02/10/21 12:17 PM   Specimen: Nasopharyngeal Swab; Nasopharyngeal(NP) swabs in vial transport medium  Result Value Ref Range Status   SARS Coronavirus 2 by RT PCR NEGATIVE NEGATIVE Final     Comment: (NOTE) SARS-CoV-2 target nucleic acids are NOT DETECTED.  The SARS-CoV-2 RNA is generally detectable in upper respiratory specimens during the acute phase of infection. The lowest concentration of SARS-CoV-2 viral copies this assay can detect is 138 copies/mL. A negative result does not preclude SARS-Cov-2 infection and should not be used as the sole basis for treatment or other patient management decisions. A negative result may occur with  improper specimen collection/handling, submission of specimen other than nasopharyngeal swab, presence of viral mutation(s) within the areas targeted by this assay, and inadequate number of viral copies(<138 copies/mL). A negative result must be combined with clinical observations, patient history, and epidemiological information. The expected result is Negative.  Fact Sheet for Patients:  BloggerCourse.com  Fact Sheet for Healthcare Providers:  SeriousBroker.it  This test is no t yet approved or cleared by the [place] FDA and  has been authorized for detection and/or diagnosis of SARS-CoV-2 by FDA under an Emergency Use Authorization (EUA). This EUA will remain  in effect (meaning this test can be used) for the duration of the COVID-19 declaration under Section 564(b)(1) of the Act, 21 U.S.C.section 360bbb-3(b)(1), unless the authorization is terminated  or revoked sooner.       Influenza A by PCR NEGATIVE NEGATIVE Final   Influenza B by PCR NEGATIVE NEGATIVE Final    Comment: (NOTE) The Xpert Xpress SARS-CoV-2/FLU/RSV plus assay is intended as an aid in the diagnosis of influenza from  Nasopharyngeal swab specimens and should not be used as a sole basis for treatment. Nasal washings and aspirates are unacceptable for Xpert Xpress SARS-CoV-2/FLU/RSV testing.  Fact Sheet for Patients: BloggerCourse.com  Fact Sheet for Healthcare  Providers: SeriousBroker.it  This test is not yet approved or cleared by the [place] FDA and has been authorized for detection and/or diagnosis of SARS-CoV-2 by FDA under an Emergency Use Authorization (EUA). This EUA will remain in effect (meaning this test can be used) for the duration of the COVID-19 declaration under Section 564(b)(1) of the Act, 21 U.S.C. section 360bbb-3(b)(1), unless the authorization is terminated or revoked.  Performed at [facility], [address], [place], [place] [address]      Labs: BNP (last 3 results) Recent Labs    01/31/21 2318 02/06/21 1215  BNP 164.1* 28.6   Basic Metabolic Panel: Recent Labs  Lab 02/05/21 0058 02/06/21 0728 02/07/21 0137 02/08/21 0113 02/09/21 0143 02/10/21 0151  NA 134* 136 133* 133* 137 134*  K 4.4 4.1 4.3 4.8 3.9 4.7  CL 97* 95* 95* 92* 94* 93*  CO2 31 32 29 30 33* 32  GLUCOSE 181* 189* 194* 238* 146* 223*  BUN 20 22* 27* 29* 22* 25*  CREATININE 1.08 1.10 1.15 1.29* 1.08 1.13  CALCIUM 8.8* 8.7* 8.5* 8.5* 8.6* 8.4*  MG 2.0 2.1 2.2 2.2  --   --   PHOS 3.2 3.4 2.8 1.9* 4.0  --    Liver Function Tests: Recent Labs  Lab 02/05/21 0058 02/06/21 0728 02/07/21 0137 02/08/21 0113 02/09/21 0143  AST 36  --   ALT --   ALKPHOS 52 53 57 60  --   BILITOT 0.4 0.8 0.6 1.0  --   PROT 6.2* 5.8* 6.3* 5.8*  --   ALBUMIN 2.7* 2.6* 2.7* 2.8* 2.6*   No results for input(s): LIPASE, AMYLASE in the last 168 hours. No results for input(s): AMMONIA in the last 168 hours. CBC: Recent Labs  Lab 02/05/21 0058 02/06/21 0437 02/07/21 0137 02/08/21 0113  WBC 9.9 11.4* 10.6* 10.6*  NEUTROABS 6.7 7.5 7.0 7.5  HGB 18.4* 18.7* 19.2* 18.8*  HCT 61.5* 63.0* 63.9* 62.4*  MCV 84.0 84.1 84.0 83.6  PLT 171 170 158 142*   Cardiac Enzymes: No results for input(s): CKTOTAL, CKMB, CKMBINDEX, TROPONINI in the last 168 hours. BNP: Invalid input(s): POCBNP CBG: Recent  Labs  Lab 02/10/21 0632 02/10/21 1052 02/10/21 1553 02/10/21 2122 02/11/21 0610  GLUCAP 232* 342* 314* 307* 151*   D-Dimer No results for input(s): DDIMER in the last 72 hours. Hgb A1c No results for input(s): HGBA1C in the last 72 hours. Lipid Profile No results for input(s): CHOL, HDL, LDLCALC, TRIG, CHOLHDL, LDLDIRECT in the last 72 hours. Thyroid function studies No results for input(s): TSH, T4TOTAL, T3FREE, THYROIDAB in the last 72 hours.  Invalid input(s): FREET3 Anemia work up No results for input(s): VITAMINB12, FOLATE, FERRITIN, TIBC, IRON, RETICCTPCT in the last 72 hours. Urinalysis    Component Value Date/Time   COLORURINE YELLOW 12/14/2019 0430   APPEARANCEUR CLEAR 12/14/2019 0430   LABSPEC 1.029 12/14/2019 0430   PHURINE 5.0 12/14/2019 0430   GLUCOSEU NEGATIVE 12/14/2019 0430   HGBUR NEGATIVE 12/14/2019 0430   BILIRUBINUR SMALL (A) 12/14/2019 0430   KETONESUR 5 (A) 12/14/2019 0430   PROTEINUR 30 (A) 12/14/2019 0430   NITRITE NEGATIVE 12/14/2019 0430   LEUKOCYTESUR TRACE (A) 12/14/2019 0430   Sepsis Labs Invalid  input(s): PROCALCITONIN,  WBC,  LACTICIDVEN Microbiology Recent Results (from the past 240 hour(s))  SARS CORONAVIRUS 2 (TAT 6-24 HRS) Nasopharyngeal Nasopharyngeal Swab     Status: None   Collection Time: 02/03/21 11:20 AM   Specimen: Nasopharyngeal Swab  Result Value Ref Range Status   SARS Coronavirus 2 NEGATIVE NEGATIVE Final    Comment: (NOTE) SARS-CoV-2 target nucleic acids are NOT DETECTED.  The SARS-CoV-2 RNA is generally detectable in upper and lower respiratory specimens during the acute phase of infection. Negative results do not preclude SARS-CoV-2 infection, do not rule out co-infections with other pathogens, and should not be used as the sole basis for treatment or other patient management decisions. Negative results must be combined with clinical observations, patient history, and epidemiological information. The  expected result is Negative.  Fact Sheet for Patients: HairSlick.no  Fact Sheet for Healthcare Providers: quierodirigir.com  This test is not yet approved or cleared by the [place] FDA and  has been authorized for detection and/or diagnosis of SARS-CoV-2 by FDA under an Emergency Use Authorization (EUA). This EUA will remain  in effect (meaning this test can be used) for the duration of the COVID-19 declaration under Se ction 564(b)(1) of the Act, 21 U.S.C. section 360bbb-3(b)(1), unless the authorization is terminated or revoked sooner.  Performed at [facility], [address] [place], [place] [address]   Resp Panel by RT-PCR (Flu A&B, Covid) Nasopharyngeal Swab     Status: None   Collection Time: 02/10/21 12:17 PM   Specimen: Nasopharyngeal Swab; Nasopharyngeal(NP) swabs in vial transport medium  Result Value Ref Range Status   SARS Coronavirus 2 by RT PCR NEGATIVE NEGATIVE Final    Comment: (NOTE) SARS-CoV-2 target nucleic acids are NOT DETECTED.  The SARS-CoV-2 RNA is generally detectable in upper respiratory specimens during the acute phase of infection. The lowest concentration of SARS-CoV-2 viral copies this assay can detect is 138 copies/mL. A negative result does not preclude SARS-Cov-2 infection and should not be used as the sole basis for treatment or other patient management decisions. A negative result may occur with  improper specimen collection/handling, submission of specimen other than nasopharyngeal swab, presence of viral mutation(s) within the areas targeted by this assay, and inadequate number of viral copies(<138 copies/mL). A negative result must be combined with clinical observations, patient history, and epidemiological information. The expected result is Negative.  Fact Sheet for Patients:  BloggerCourse.com  Fact Sheet for Healthcare Providers:   SeriousBroker.it  This test is no t yet approved or cleared by the [place] FDA and  has been authorized for detection and/or diagnosis of SARS-CoV-2 by FDA under an Emergency Use Authorization (EUA). This EUA will remain  in effect (meaning this test can be used) for the duration of the COVID-19 declaration under Section 564(b)(1) of the Act, 21 U.S.C.section 360bbb-3(b)(1), unless the authorization is terminated  or revoked sooner.       Influenza A by PCR NEGATIVE NEGATIVE Final   Influenza B by PCR NEGATIVE NEGATIVE Final    Comment: (NOTE) The Xpert Xpress SARS-CoV-2/FLU/RSV plus assay is intended as an aid in the diagnosis of influenza from Nasopharyngeal swab specimens and should not be used as a sole basis for treatment. Nasal washings and aspirates are unacceptable for Xpert Xpress SARS-CoV-2/FLU/RSV testing.  Fact Sheet for Patients: BloggerCourse.com  Fact Sheet for Healthcare Providers: SeriousBroker.it  This test is not yet approved or cleared by the [place] FDA and has been authorized for detection and/or  diagnosis of SARS-CoV-2 by FDA under an Emergency Use Authorization (EUA). This EUA will remain in effect (meaning this test can be used) for the duration of the COVID-19 declaration under Section 564(b)(1) of the Act, 21 U.S.C. section 360bbb-3(b)(1), unless the authorization is terminated or revoked.  Performed at [facility], [address] [place], [place] [address]      Time coordinating discharge:  SIGNED:   Erick Blinks, MD  [facility] 02/11/2021, 11:17 AM   If 7PM-7AM, please contact night-coverage [email or web address]

## 2021-02-10 NOTE — Progress Notes (Signed)
Inpatient Diabetes Program Recommendations  AACE/ADA: New Consensus Statement on Inpatient Glycemic Control (2015)  Target Ranges:  Prepandial:   less than 140 mg/dL      Peak postprandial:   less than 180 mg/dL (1-2 hours)      Critically ill patients:  140 - 180 mg/dL   Lab Results  Component Value Date   GLUCAP 342 (H) 02/10/2021   HGBA1C 7.0 (H) 02/01/2021    Review of Glycemic Control  Blood sugars consistently above goal. This am: 223, 232, 342 5/12: 141, 254, 344   Inpatient Diabetes Program Recommendations:     Add Lantus 12 units QHS Add Novolog 2 units TID with meals if eating > 50%.  Will follow.  Thank you. Ailene Ards, RD, LDN, CDE Inpatient Diabetes Coordinator [phone]

## 2021-02-10 NOTE — Progress Notes (Signed)
Report given to Jabier Mutton RN at [facility].

## 2021-02-11 LAB — GLUCOSE, CAPILLARY: Glucose-Capillary: 151 mg/dL — ABNORMAL HIGH (ref 70–99)

## 2021-02-11 MED ORDER — MAGNESIUM OXIDE 400 MG PO CAPS: 400.0000 mg | ORAL_CAPSULE | Freq: Two times a day (BID) | ORAL | 0 refills | Status: DC

## 2021-02-11 MED ORDER — CLONAZEPAM 0.5 MG PO TABS: 0.2500 mg | ORAL_TABLET | Freq: Two times a day (BID) | ORAL | 0 refills | Status: DC

## 2021-02-11 MED ORDER — PANTOPRAZOLE SODIUM 40 MG PO TBEC: 40.0000 mg | DELAYED_RELEASE_TABLET | Freq: Every day | ORAL | 1 refills | Status: DC

## 2021-02-11 MED ORDER — FUROSEMIDE 40 MG PO TABS: 60.0000 mg | ORAL_TABLET | Freq: Two times a day (BID) | ORAL | Status: DC

## 2021-02-11 NOTE — TOC Transition Note (Signed)
Transition of Care [facility]) - CM/SW Discharge Note   Patient Details  Name: Tommy Mathews MRN: [id number] Date of Birth: 31-Mar-1961  [facility]) CM/SW Contact:  Patrice Paradise, LCSW Phone Number: [phone] 02/11/2021, 10:57 AM   Clinical Narrative:    Patient will DC to:?[facility] Anticipated DC date:?02/11/2021 Family notified:?Elizabeth Transport by: PTAR   Pt's discharge delayed yesterday due to transport. Per MD patient ready for DC to [facility]. RN, patient, patient's family, and facility notified of DC. Discharge Summary sent to facility. RN given number for report RN given number for [phone] ask for [place]. DC packet on chart. Ambulance transport requested for patient.   CSW signing off.   Judd Lien, [place] [phone]    Final next level of care: Skilled Nursing Facility Barriers to Discharge: Barriers Resolved   Patient Goals and CMS Choice        Discharge Placement              Patient chooses bed at: [facility] Patient to be transferred to facility by: PTAR Name of family member notified: Lanora Manis, mother- left voice message Patient and family notified of of transfer: 02/03/21  Discharge Plan and Services In-house Referral: Clinical Social Work                                   Social Determinants of Health (SDOH) Interventions     Readmission Risk Interventions No flowsheet data found.

## 2021-02-11 NOTE — Progress Notes (Signed)
Report given to Gillera, Vangie RN at [facility].   

## 2021-02-11 NOTE — Progress Notes (Signed)
Pt discharged via PTAR service.  Pt taken off telemetry and CCMD notified.  Pt's IV removed.  Pt left with all pf their personal belongings.  AVS sent with PTAR to [facility] facility.

## 2021-02-11 NOTE — Progress Notes (Signed)
Patient was discharged to SNF yesterday but did not leave the hospital due to transportation issues.  Patient was seen and examined today. He feels great, no shortness of breath and looking forward to discharging. Vitals overnight noted to be stable. Lungs are clear bilaterally and no peripheral edema.  No changes in discharge meds or discharge plan. Please refer to discharge summary done yesterday for further details. He is stable for discharge today.  [employer]

## 2021-02-11 NOTE — TOC Progression Note (Addendum)
Transition of Care [facility]) - Progression Note    Patient Details  Name: Tommy Mathews MRN: [id number] Date of Birth: December 20, 1960  Transition of Care [facility]) CM/SW Contact  Patrice Paradise, [place] Phone Number: [phone] 02/11/2021, 9:40 AM  Clinical Narrative:     Pt's discharge did not occur last yesterday due to PTAR late transportation.CSW had to facility several times. CSW spoke with front desk and she had to take down CSW number in order to provide the RN on the floor. CSW is awaiting a call back.  TOC team will continue to assist with discharge planning needs.  Expected Discharge Plan: Skilled Nursing Facility Barriers to Discharge: Barriers Resolved  Expected Discharge Plan and Services Expected Discharge Plan: Skilled Nursing Facility In-house Referral: Clinical Social Work     Living arrangements for the past 2 months: Skilled Nursing Facility Expected Discharge Date: 02/10/21                                     Social Determinants of Health (SDOH) Interventions    Readmission Risk Interventions No flowsheet data found.

## 2021-03-07 ENCOUNTER — Other Ambulatory Visit: Payer: Self-pay

## 2021-03-07 ENCOUNTER — Ambulatory Visit (HOSPITAL_COMMUNITY)
Admission: RE | Admit: 2021-03-07 | Discharge: 2021-03-07 | Disposition: A | Discharge status: Home / Self Care | Payer: Medicaid Other | Source: Ambulatory Visit | Attending: Acute Care | Admitting: Acute Care

## 2021-03-07 DIAGNOSIS — Z430 Encounter for attention to tracheostomy: Secondary | ICD-10-CM | POA: Insufficient documentation

## 2021-03-07 DIAGNOSIS — F17210 Nicotine dependence, cigarettes, uncomplicated: Secondary | ICD-10-CM | POA: Diagnosis not present

## 2021-03-07 DIAGNOSIS — Z993 Dependence on wheelchair: Secondary | ICD-10-CM | POA: Insufficient documentation

## 2021-03-07 DIAGNOSIS — J9611 Chronic respiratory failure with hypoxia: Secondary | ICD-10-CM | POA: Insufficient documentation

## 2021-03-07 DIAGNOSIS — E119 Type 2 diabetes mellitus without complications: Secondary | ICD-10-CM | POA: Insufficient documentation

## 2021-03-07 DIAGNOSIS — Z8719 Personal history of other diseases of the digestive system: Secondary | ICD-10-CM | POA: Insufficient documentation

## 2021-03-07 DIAGNOSIS — I5022 Chronic systolic (congestive) heart failure: Secondary | ICD-10-CM | POA: Insufficient documentation

## 2021-03-07 DIAGNOSIS — Z87820 Personal history of traumatic brain injury: Secondary | ICD-10-CM | POA: Insufficient documentation

## 2021-03-07 DIAGNOSIS — Z930 Tracheostomy status: Secondary | ICD-10-CM | POA: Insufficient documentation

## 2021-03-07 DIAGNOSIS — Z9119 Patient's noncompliance with other medical treatment and regimen: Secondary | ICD-10-CM | POA: Insufficient documentation

## 2021-03-07 DIAGNOSIS — J449 Chronic obstructive pulmonary disease, unspecified: Secondary | ICD-10-CM | POA: Insufficient documentation

## 2021-03-07 DIAGNOSIS — Z9981 Dependence on supplemental oxygen: Secondary | ICD-10-CM | POA: Insufficient documentation

## 2021-03-07 DIAGNOSIS — F319 Bipolar disorder, unspecified: Secondary | ICD-10-CM | POA: Insufficient documentation

## 2021-03-07 DIAGNOSIS — I480 Paroxysmal atrial fibrillation: Secondary | ICD-10-CM | POA: Diagnosis not present

## 2021-03-07 DIAGNOSIS — F209 Schizophrenia, unspecified: Secondary | ICD-10-CM | POA: Insufficient documentation

## 2021-03-07 DIAGNOSIS — I110 Hypertensive heart disease with heart failure: Secondary | ICD-10-CM | POA: Diagnosis not present

## 2021-03-07 NOTE — Progress Notes (Signed)
Tracheostomy Procedure Note  Tommy Mathews [id number] 01-Nov-1960  Pre Procedure Tracheostomy Information  Trach Brand: Shiley Size: 6.0 Style: Uncuffed Secured by: Velcro   Procedure: Trach Decannulation    Post Procedure Tracheostomy Information Decannulation and dressing bandage applied   Post Procedure Evaluation:  Vital signs:VSS Patients current condition: stable Complications: No apparent complications Trach site exam: clean  and dry Wound care done: Bandage over trach site Patient did tolerate procedure well.   Education: Hold trach site when coughing and speaking  Prescription needs: Extra bandages for trach site sent back by NP to [facility]    Additional needs: None

## 2021-03-07 NOTE — Progress Notes (Signed)
[facility]   Reason for visit:  Trach evaluation  HPI:  This is a 60 year old male patient with a known history of systolic heart failure, diabetes, hypertension, paroxysmal atrial fibrillation, prior GI bleed, chronic obstructive pulmonary disease with ongoing tobacco abuse and chronic respiratory failure requiring oxygen.  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  Of note he has not been to the tracheostomy clinic before.  He has had pulmonary consultation back in 2019 and [place] at which time consideration for decannulation was noted however it appears as though he had been lost to follow-up in the meantime.  Presents today for tracheostomy evaluation from the skilled nursing facility he is not an excellent historian.  I did call the nursing station who was not able to give me much more information, however I did get some additional information from his mother whom I also called.  Essentially he is doing quite well at the skilled nursing facility.  He still smokes daily.  One of the biggest challenges the nursing staff, and his mother both verbalizes his absolute refusal to wear his supplemental oxygen via trach collar.  He simply states he just does not like it.  While he was in the clinic based on review of existing records we went ahead and initiated capping trial with continuous pulse oximetry and occlusive trach.  Of note on arrival his pulse oximetry was 77% on room air with the tracheostomy, when we placed occlusive And nasal cannula he is sustained saturations of 92% without any difficulty and claimed "I like this much better". Past Medical History:  Diagnosis Date  . Diabetes (HCC)   . GERD (gastroesophageal reflux disease)   . HLD (hyperlipidemia)    Chronic trach dependence Medical non-compliance     COPD    Tobacco abuse     Resides at SNF    Bipolar disease, prior TBI   . HTN (hypertension)    Social hx Resides as SNF Still smokes  Family hx He is not able to provide  No Known Allergies   ROS  Review of Systems -  Not entirely reliable.  However he denies worsening shortness of breath, no recent headache, nasal discharge, sore throat, worsening cough, chest discomfort. Vital signs:  Continuous pulse oximetry evaluation or 45 minutes in the tracheostomy clinic.  This was on 2 L via nasal cannula with tracheostomy capped pulse oximetry remained between 90 to 96% Exam:  General 60 year old male patient he is wheelchair-bound at baseline he is in no acute distress HEENT normocephalic atraumatic the tracheostomy is now removed with an occlusive dressing in place.  He does require finger occlusion/stoma splinting to assist with phonation and cough, but he has been able to reproduce this independently since we instructed him how to do so Pulmonary some scattered rhonchi with good strong cough no accessory use Cardiac: Regular rate and rhythm Abdomen: Soft not tender Extremities: Warm dry Neuro awake oriented follows commands Trach change/procedure: Tracheostomy removed occlusive dressing placed      Impression/dx  Chronic hypoxic respiratory failure Ongoing tobacco abuse Chronic obstructive pulmonary disease Resolved tracheostomy dependence following prolonged ventilator wean (remote) Discussion  Shondell had actually been evaluated back in 2019 for tracheostomy removal, however at the time there was little information available, and there was concern about daytime somnolence and possibly sleep apnea.  Since 2019 there has been no further  follow-up in regards to his tracheostomy or has the question been asked should he keep it.  In the meantime he has been widely noncompliant with his supplemental oxygen, often pulse oximetry in the mid 70s on room air simply refusing to wear aerosol trach collar per the  nursing staff and verified by his family.  He also still actively smokes.  He does well with [name] valve in place and he looks surprisingly comfortable even with pulse oximetry at 77.  Based on initial evaluation I decided to proceed with capping trial.  Placing him on supplemental oxygen via nasal cannula during this period almost immediately with simple supplemental oxygen via nasal cannula pulse oximetry rose to 92 to 94%.  On 31% aerosol trach collar we are only able to maintain around 85%, and he reported he just will not wear it.  Because of this we proceeded with capping trial whereby we monitored him for 45 minutes with continuous pulse oximetry and occlusive trach.  He tolerated this well without any episodes of desaturation.  His initial phonation quality was poor as was his cough, however we instructed him on tracheal stoma occlusion with his finger which he was able to successfully redemonstrate, and with that support his phonation quality was excellent as was his cough.  During our monitoring session I also reached out to his nursing staff, in addition to his mother at home.  I think given his noncompliance with supplemental oxygen via trach collar he is far more likely to live longer avoiding hypoxia then he is to be hypoxic but still have a trach because of this I have removed it and we will not replace it  Plan  Keep the current tracheostomy dressing in place for 48 hours, after that can change with the provided tracheostomy dressings until they are gone Change these every 48 hours as well After the tracheostomy dressings are gone you can change with regular Band-Aid daily until tracheostomy stoma completely closed I have instructed him on tracheostomy splinting to assist with phonation and cough Should his stoma not closed in the next 2 weeks 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   Simonne Martinet ACNP-BC [facility]  Pulmonary/Critical Care

## 2021-03-20 ENCOUNTER — Other Ambulatory Visit: Payer: Self-pay

## 2021-03-20 ENCOUNTER — Encounter (HOSPITAL_COMMUNITY): Payer: Self-pay | Admitting: Emergency Medicine

## 2021-03-20 ENCOUNTER — Inpatient Hospital Stay (HOSPITAL_COMMUNITY)
Admission: EM | Admit: 2021-03-20 | Discharge: 2021-04-17 | DRG: 190 | Disposition: A | Discharge status: Home / Self Care | Payer: Medicaid Other | Attending: Internal Medicine | Admitting: Internal Medicine

## 2021-03-20 DIAGNOSIS — I82409 Acute embolism and thrombosis of unspecified deep veins of unspecified lower extremity: Secondary | ICD-10-CM | POA: Diagnosis present

## 2021-03-20 DIAGNOSIS — Z8719 Personal history of other diseases of the digestive system: Secondary | ICD-10-CM

## 2021-03-20 DIAGNOSIS — F17210 Nicotine dependence, cigarettes, uncomplicated: Secondary | ICD-10-CM | POA: Diagnosis present

## 2021-03-20 DIAGNOSIS — D751 Secondary polycythemia: Secondary | ICD-10-CM | POA: Diagnosis present

## 2021-03-20 DIAGNOSIS — E119 Type 2 diabetes mellitus without complications: Secondary | ICD-10-CM

## 2021-03-20 DIAGNOSIS — Z7982 Long term (current) use of aspirin: Secondary | ICD-10-CM

## 2021-03-20 DIAGNOSIS — I071 Rheumatic tricuspid insufficiency: Secondary | ICD-10-CM | POA: Diagnosis present

## 2021-03-20 DIAGNOSIS — D649 Anemia, unspecified: Secondary | ICD-10-CM | POA: Diagnosis present

## 2021-03-20 DIAGNOSIS — R0600 Dyspnea, unspecified: Secondary | ICD-10-CM

## 2021-03-20 DIAGNOSIS — E873 Alkalosis: Secondary | ICD-10-CM | POA: Diagnosis present

## 2021-03-20 DIAGNOSIS — Z23 Encounter for immunization: Secondary | ICD-10-CM

## 2021-03-20 DIAGNOSIS — Z28311 Partially vaccinated for covid-19: Secondary | ICD-10-CM

## 2021-03-20 DIAGNOSIS — J449 Chronic obstructive pulmonary disease, unspecified: Secondary | ICD-10-CM | POA: Diagnosis present

## 2021-03-20 DIAGNOSIS — I110 Hypertensive heart disease with heart failure: Secondary | ICD-10-CM | POA: Diagnosis present

## 2021-03-20 DIAGNOSIS — I272 Pulmonary hypertension, unspecified: Secondary | ICD-10-CM | POA: Diagnosis present

## 2021-03-20 DIAGNOSIS — F3130 Bipolar disorder, current episode depressed, mild or moderate severity, unspecified: Secondary | ICD-10-CM | POA: Diagnosis present

## 2021-03-20 DIAGNOSIS — F209 Schizophrenia, unspecified: Secondary | ICD-10-CM | POA: Diagnosis present

## 2021-03-20 DIAGNOSIS — Z794 Long term (current) use of insulin: Secondary | ICD-10-CM

## 2021-03-20 DIAGNOSIS — J9811 Atelectasis: Secondary | ICD-10-CM | POA: Diagnosis present

## 2021-03-20 DIAGNOSIS — I82413 Acute embolism and thrombosis of femoral vein, bilateral: Secondary | ICD-10-CM | POA: Diagnosis present

## 2021-03-20 DIAGNOSIS — E785 Hyperlipidemia, unspecified: Secondary | ICD-10-CM | POA: Diagnosis present

## 2021-03-20 DIAGNOSIS — N179 Acute kidney failure, unspecified: Secondary | ICD-10-CM | POA: Diagnosis present

## 2021-03-20 DIAGNOSIS — E1165 Type 2 diabetes mellitus with hyperglycemia: Secondary | ICD-10-CM | POA: Diagnosis present

## 2021-03-20 DIAGNOSIS — R0902 Hypoxemia: Secondary | ICD-10-CM

## 2021-03-20 DIAGNOSIS — R0602 Shortness of breath: Principal | ICD-10-CM

## 2021-03-20 DIAGNOSIS — Z79899 Other long term (current) drug therapy: Secondary | ICD-10-CM

## 2021-03-20 DIAGNOSIS — J441 Chronic obstructive pulmonary disease with (acute) exacerbation: Principal | ICD-10-CM | POA: Diagnosis present

## 2021-03-20 DIAGNOSIS — Z6835 Body mass index (BMI) 35.0-35.9, adult: Secondary | ICD-10-CM

## 2021-03-20 DIAGNOSIS — Z7951 Long term (current) use of inhaled steroids: Secondary | ICD-10-CM

## 2021-03-20 DIAGNOSIS — F429 Obsessive-compulsive disorder, unspecified: Secondary | ICD-10-CM | POA: Diagnosis present

## 2021-03-20 DIAGNOSIS — I82431 Acute embolism and thrombosis of right popliteal vein: Secondary | ICD-10-CM | POA: Diagnosis present

## 2021-03-20 DIAGNOSIS — Z87820 Personal history of traumatic brain injury: Secondary | ICD-10-CM

## 2021-03-20 DIAGNOSIS — K219 Gastro-esophageal reflux disease without esophagitis: Secondary | ICD-10-CM | POA: Diagnosis present

## 2021-03-20 DIAGNOSIS — I480 Paroxysmal atrial fibrillation: Secondary | ICD-10-CM | POA: Diagnosis present

## 2021-03-20 DIAGNOSIS — E669 Obesity, unspecified: Secondary | ICD-10-CM | POA: Diagnosis present

## 2021-03-20 DIAGNOSIS — I5033 Acute on chronic diastolic (congestive) heart failure: Secondary | ICD-10-CM | POA: Diagnosis present

## 2021-03-20 DIAGNOSIS — J9621 Acute and chronic respiratory failure with hypoxia: Secondary | ICD-10-CM | POA: Diagnosis present

## 2021-03-20 DIAGNOSIS — Z9981 Dependence on supplemental oxygen: Secondary | ICD-10-CM

## 2021-03-20 DIAGNOSIS — Z20822 Contact with and (suspected) exposure to covid-19: Secondary | ICD-10-CM | POA: Diagnosis present

## 2021-03-20 HISTORY — DX: Chronic obstructive pulmonary disease, unspecified: J44.9

## 2021-03-20 HISTORY — DX: Bipolar disorder, unspecified: F31.9

## 2021-03-20 LAB — CBC WITH DIFFERENTIAL/PLATELET
Abs Immature Granulocytes: 0.02 10*3/uL (ref 0.00–0.07)
Basophils Absolute: 0.1 10*3/uL (ref 0.0–0.1)
Basophils Relative: 1 %
Eosinophils Absolute: 0.1 10*3/uL (ref 0.0–0.5)
Eosinophils Relative: 1 %
HCT: 65.7 % — ABNORMAL HIGH (ref 39.0–52.0)
Hemoglobin: 19.2 g/dL — ABNORMAL HIGH (ref 13.0–17.0)
Immature Granulocytes: 0 %
Lymphocytes Relative: 32 %
Lymphs Abs: 2.1 10*3/uL (ref 0.7–4.0)
MCH: 25.2 pg — ABNORMAL LOW (ref 26.0–34.0)
MCHC: 29.2 g/dL — ABNORMAL LOW (ref 30.0–36.0)
MCV: 86.2 fL (ref 80.0–100.0)
Monocytes Absolute: 0.8 10*3/uL (ref 0.1–1.0)
Monocytes Relative: 12 %
Neutro Abs: 3.5 10*3/uL (ref 1.7–7.7)
Neutrophils Relative %: 54 %
Platelets: 181 10*3/uL (ref 150–400)
RBC: 7.62 MIL/uL — ABNORMAL HIGH (ref 4.22–5.81)
RDW: 23.1 % — ABNORMAL HIGH (ref 11.5–15.5)
WBC: 6.6 10*3/uL (ref 4.0–10.5)
nRBC: 0 % (ref 0.0–0.2)

## 2021-03-20 LAB — COMPREHENSIVE METABOLIC PANEL
ALT: 22 U/L (ref 0–44)
AST: 33 U/L (ref 15–41)
Albumin: 3.3 g/dL — ABNORMAL LOW (ref 3.5–5.0)
Alkaline Phosphatase: 93 U/L (ref 38–126)
Anion gap: 8 (ref 5–15)
BUN: 46 mg/dL — ABNORMAL HIGH (ref 6–20)
CO2: 25 mmol/L (ref 22–32)
Calcium: 9.1 mg/dL (ref 8.9–10.3)
Chloride: 107 mmol/L (ref 98–111)
Creatinine, Ser: 1.88 mg/dL — ABNORMAL HIGH (ref 0.61–1.24)
GFR, Estimated: 41 mL/min — ABNORMAL LOW (ref >60)
Glucose, Bld: 108 mg/dL — ABNORMAL HIGH (ref 70–99)
Potassium: 3.7 mmol/L (ref 3.5–5.1)
Sodium: 140 mmol/L (ref 135–145)
Total Bilirubin: 1.1 mg/dL (ref 0.3–1.2)
Total Protein: 6.9 g/dL (ref 6.5–8.1)

## 2021-03-20 LAB — URINALYSIS, ROUTINE W REFLEX MICROSCOPIC
Bilirubin Urine: NEGATIVE
Glucose, UA: NEGATIVE mg/dL
Hgb urine dipstick: NEGATIVE
Ketones, ur: NEGATIVE mg/dL
Leukocytes,Ua: NEGATIVE
Nitrite: NEGATIVE
Protein, ur: NEGATIVE mg/dL
Specific Gravity, Urine: 1.01 (ref 1.005–1.030)
pH: 6 (ref 5.0–8.0)

## 2021-03-20 LAB — SALICYLATE LEVEL: Salicylate Lvl: <7 mg/dL — ABNORMAL LOW (ref 7.0–30.0)

## 2021-03-20 LAB — AMMONIA: Ammonia: 81 umol/L — ABNORMAL HIGH (ref 9–35)

## 2021-03-20 LAB — RAPID URINE DRUG SCREEN, HOSP PERFORMED
Amphetamines: NOT DETECTED
Barbiturates: NOT DETECTED
Benzodiazepines: NOT DETECTED
Cocaine: NOT DETECTED
Opiates: NOT DETECTED
Tetrahydrocannabinol: NOT DETECTED

## 2021-03-20 LAB — ETHANOL: Alcohol, Ethyl (B): <10 mg/dL (ref <10)
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
## 2021-03-20 NOTE — ED Triage Notes (Addendum)
Pt placed on 3L [place] in triage while waiting in lobby. Pt A&Ox3. Disoriented to date.
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
## 2021-03-21 ENCOUNTER — Encounter (HOSPITAL_COMMUNITY): Payer: Self-pay | Admitting: Internal Medicine

## 2021-03-21 ENCOUNTER — Emergency Department (HOSPITAL_COMMUNITY): Payer: Medicaid Other

## 2021-03-21 DIAGNOSIS — N179 Acute kidney failure, unspecified: Secondary | ICD-10-CM

## 2021-03-21 DIAGNOSIS — I5033 Acute on chronic diastolic (congestive) heart failure: Secondary | ICD-10-CM

## 2021-03-21 DIAGNOSIS — Z20822 Contact with and (suspected) exposure to covid-19: Secondary | ICD-10-CM | POA: Diagnosis present

## 2021-03-21 DIAGNOSIS — J441 Chronic obstructive pulmonary disease with (acute) exacerbation: Principal | ICD-10-CM

## 2021-03-21 DIAGNOSIS — I272 Pulmonary hypertension, unspecified: Secondary | ICD-10-CM | POA: Diagnosis present

## 2021-03-21 DIAGNOSIS — Z28311 Partially vaccinated for covid-19: Secondary | ICD-10-CM | POA: Diagnosis not present

## 2021-03-21 DIAGNOSIS — J9811 Atelectasis: Secondary | ICD-10-CM | POA: Diagnosis present

## 2021-03-21 DIAGNOSIS — D751 Secondary polycythemia: Secondary | ICD-10-CM | POA: Diagnosis present

## 2021-03-21 DIAGNOSIS — E119 Type 2 diabetes mellitus without complications: Secondary | ICD-10-CM | POA: Diagnosis not present

## 2021-03-21 DIAGNOSIS — I071 Rheumatic tricuspid insufficiency: Secondary | ICD-10-CM | POA: Diagnosis present

## 2021-03-21 DIAGNOSIS — J9621 Acute and chronic respiratory failure with hypoxia: Secondary | ICD-10-CM | POA: Diagnosis present

## 2021-03-21 DIAGNOSIS — F209 Schizophrenia, unspecified: Secondary | ICD-10-CM | POA: Diagnosis present

## 2021-03-21 DIAGNOSIS — J431 Panlobular emphysema: Secondary | ICD-10-CM | POA: Diagnosis not present

## 2021-03-21 DIAGNOSIS — I2699 Other pulmonary embolism without acute cor pulmonale: Secondary | ICD-10-CM | POA: Diagnosis not present

## 2021-03-21 DIAGNOSIS — I5021 Acute systolic (congestive) heart failure: Secondary | ICD-10-CM | POA: Diagnosis not present

## 2021-03-21 DIAGNOSIS — Z9981 Dependence on supplemental oxygen: Secondary | ICD-10-CM | POA: Diagnosis not present

## 2021-03-21 DIAGNOSIS — E1165 Type 2 diabetes mellitus with hyperglycemia: Secondary | ICD-10-CM | POA: Diagnosis present

## 2021-03-21 DIAGNOSIS — F3130 Bipolar disorder, current episode depressed, mild or moderate severity, unspecified: Secondary | ICD-10-CM | POA: Diagnosis present

## 2021-03-21 DIAGNOSIS — E669 Obesity, unspecified: Secondary | ICD-10-CM | POA: Diagnosis present

## 2021-03-21 DIAGNOSIS — R0902 Hypoxemia: Secondary | ICD-10-CM | POA: Diagnosis not present

## 2021-03-21 DIAGNOSIS — Z6835 Body mass index (BMI) 35.0-35.9, adult: Secondary | ICD-10-CM | POA: Diagnosis not present

## 2021-03-21 DIAGNOSIS — R0602 Shortness of breath: Secondary | ICD-10-CM | POA: Diagnosis present

## 2021-03-21 DIAGNOSIS — E873 Alkalosis: Secondary | ICD-10-CM | POA: Diagnosis present

## 2021-03-21 DIAGNOSIS — I480 Paroxysmal atrial fibrillation: Secondary | ICD-10-CM | POA: Diagnosis present

## 2021-03-21 DIAGNOSIS — I82431 Acute embolism and thrombosis of right popliteal vein: Secondary | ICD-10-CM | POA: Diagnosis present

## 2021-03-21 DIAGNOSIS — Z23 Encounter for immunization: Secondary | ICD-10-CM | POA: Diagnosis not present

## 2021-03-21 DIAGNOSIS — I82413 Acute embolism and thrombosis of femoral vein, bilateral: Secondary | ICD-10-CM | POA: Diagnosis present

## 2021-03-21 DIAGNOSIS — Z794 Long term (current) use of insulin: Secondary | ICD-10-CM

## 2021-03-21 DIAGNOSIS — I110 Hypertensive heart disease with heart failure: Secondary | ICD-10-CM | POA: Diagnosis present

## 2021-03-21 DIAGNOSIS — D649 Anemia, unspecified: Secondary | ICD-10-CM | POA: Diagnosis present

## 2021-03-21 LAB — GLUCOSE, CAPILLARY
Glucose-Capillary: 156 mg/dL — ABNORMAL HIGH (ref 70–99)
Glucose-Capillary: 155 mg/dL — ABNORMAL HIGH (ref 70–99)
Glucose-Capillary: 107 mg/dL — ABNORMAL HIGH (ref 70–99)

## 2021-03-21 LAB — HIV ANTIBODY (ROUTINE TESTING W REFLEX): HIV Screen 4th Generation wRfx: NONREACTIVE

## 2021-03-21 LAB — PROCALCITONIN: Procalcitonin: <0.1 ng/mL

## 2021-03-21 LAB — RESP PANEL BY RT-PCR (FLU A&B, COVID) ARPGX2
Influenza A by PCR: NEGATIVE
Influenza B by PCR: NEGATIVE
SARS Coronavirus 2 by RT PCR: NEGATIVE

## 2021-03-21 LAB — VALPROIC ACID LEVEL: Valproic Acid Lvl: 16 ug/mL — ABNORMAL LOW (ref 50.0–100.0)

## 2021-03-21 LAB — BRAIN NATRIURETIC PEPTIDE: B Natriuretic Peptide: 263.8 pg/mL — ABNORMAL HIGH (ref 0.0–100.0)

## 2021-03-21 LAB — DIGOXIN LEVEL: Digoxin Level: 0.9 ng/mL (ref 0.8–2.0)

## 2021-03-21 MED ORDER — DIGOXIN 125 MCG PO TABS
0.2500 mg | ORAL_TABLET | Freq: Every day | ORAL | Status: DC
  Administered 2021-03-21 – 2021-04-17 (×28): 0.25 mg via ORAL
  Filled 2021-03-21 (×28): qty 2

## 2021-03-21 MED ORDER — FUROSEMIDE 10 MG/ML IJ SOLN
40.0000 mg | Freq: Two times a day (BID) | INTRAMUSCULAR | Status: DC
  Administered 2021-03-21 – 2021-03-29 (×16): 40 mg via INTRAVENOUS
  Filled 2021-03-21 (×16): qty 4

## 2021-03-21 MED ORDER — ACETAMINOPHEN 325 MG PO TABS: 650.0000 mg | ORAL_TABLET | ORAL | Status: DC | PRN

## 2021-03-21 MED ORDER — ACETYLCYSTEINE 20 % IN SOLN
4.0000 mL | Freq: Two times a day (BID) | RESPIRATORY_TRACT | Status: DC | PRN
  Filled 2021-03-21: qty 4

## 2021-03-21 MED ORDER — IPRATROPIUM-ALBUTEROL 0.5-2.5 (3) MG/3ML IN SOLN
3.0000 mL | Freq: Once | RESPIRATORY_TRACT | Status: AC
  Administered 2021-03-21: 3 mL via RESPIRATORY_TRACT
  Filled 2021-03-21: qty 3

## 2021-03-21 MED ORDER — QUETIAPINE FUMARATE 100 MG PO TABS
200.0000 mg | ORAL_TABLET | Freq: Every day | ORAL | Status: DC
  Administered 2021-03-21 – 2021-04-16 (×27): 200 mg via ORAL
  Filled 2021-03-21: qty 4
  Filled 2021-03-21 (×26): qty 2

## 2021-03-21 MED ORDER — BISACODYL 10 MG RE SUPP: 10.0000 mg | Freq: Every day | RECTAL | Status: DC | PRN

## 2021-03-21 MED ORDER — ASPIRIN 81 MG PO CHEW
81.0000 mg | CHEWABLE_TABLET | Freq: Every day | ORAL | Status: DC
  Administered 2021-03-21 – 2021-04-13 (×24): 81 mg via ORAL
  Filled 2021-03-21 (×25): qty 1

## 2021-03-21 MED ORDER — BUDESONIDE 0.5 MG/2ML IN SUSP
0.5000 mg | Freq: Two times a day (BID) | RESPIRATORY_TRACT | Status: DC
  Administered 2021-03-21 – 2021-04-17 (×50): 0.5 mg via RESPIRATORY_TRACT
  Filled 2021-03-21 (×55): qty 2

## 2021-03-21 MED ORDER — GABAPENTIN 100 MG PO CAPS
200.0000 mg | ORAL_CAPSULE | Freq: Two times a day (BID) | ORAL | Status: DC
  Administered 2021-03-21 – 2021-04-17 (×54): 200 mg via ORAL
  Filled 2021-03-21 (×53): qty 2

## 2021-03-21 MED ORDER — DIVALPROEX SODIUM 250 MG PO DR TAB
500.0000 mg | DELAYED_RELEASE_TABLET | Freq: Two times a day (BID) | ORAL | Status: DC
  Administered 2021-03-21 – 2021-03-23 (×4): 500 mg via ORAL
  Filled 2021-03-21 (×4): qty 2

## 2021-03-21 MED ORDER — SODIUM CHLORIDE 0.9 % IV SOLN: 250.0000 mL | INTRAVENOUS | Status: DC | PRN

## 2021-03-21 MED ORDER — IPRATROPIUM-ALBUTEROL 0.5-2.5 (3) MG/3ML IN SOLN
3.0000 mL | Freq: Two times a day (BID) | RESPIRATORY_TRACT | Status: DC
  Administered 2021-03-21 – 2021-03-24 (×6): 3 mL via RESPIRATORY_TRACT
  Filled 2021-03-21 (×6): qty 3

## 2021-03-21 MED ORDER — INSULIN ASPART 100 UNIT/ML IJ SOLN
0.0000 [IU] | Freq: Every day | INTRAMUSCULAR | Status: DC
  Administered 2021-03-22: 2 [IU] via SUBCUTANEOUS
  Administered 2021-03-24: 4 [IU] via SUBCUTANEOUS
  Administered 2021-03-27: 3 [IU] via SUBCUTANEOUS
  Administered 2021-03-28: 5 [IU] via SUBCUTANEOUS

## 2021-03-21 MED ORDER — MAGNESIUM OXIDE -MG SUPPLEMENT 400 (240 MG) MG PO TABS
400.0000 mg | ORAL_TABLET | Freq: Two times a day (BID) | ORAL | Status: DC
  Administered 2021-03-21 – 2021-04-12 (×44): 400 mg via ORAL
  Filled 2021-03-21 (×43): qty 1

## 2021-03-21 MED ORDER — SODIUM CHLORIDE 3 % IN NEBU
4.0000 mL | INHALATION_SOLUTION | Freq: Every day | RESPIRATORY_TRACT | Status: AC
  Administered 2021-03-21 – 2021-03-23 (×3): 4 mL via RESPIRATORY_TRACT
  Filled 2021-03-21 (×3): qty 4

## 2021-03-21 MED ORDER — PANTOPRAZOLE SODIUM 40 MG PO TBEC
40.0000 mg | DELAYED_RELEASE_TABLET | Freq: Every day | ORAL | Status: DC
  Administered 2021-03-21 – 2021-04-17 (×28): 40 mg via ORAL
  Filled 2021-03-21 (×28): qty 1

## 2021-03-21 MED ORDER — HEPARIN SODIUM (PORCINE) 5000 UNIT/ML IJ SOLN
5000.0000 [IU] | Freq: Three times a day (TID) | INTRAMUSCULAR | Status: DC
  Administered 2021-03-21 – 2021-04-11 (×61): 5000 [IU] via SUBCUTANEOUS
  Filled 2021-03-21 (×61): qty 1

## 2021-03-21 MED ORDER — FLUOCINOLONE ACETONIDE SCALP 0.01 % EX OIL: 1.0000 "application " | TOPICAL_OIL | CUTANEOUS | Status: DC

## 2021-03-21 MED ORDER — SODIUM CHLORIDE 0.9% FLUSH
3.0000 mL | Freq: Two times a day (BID) | INTRAVENOUS | Status: DC
  Administered 2021-03-21 – 2021-04-17 (×53): 3 mL via INTRAVENOUS

## 2021-03-21 MED ORDER — DOCUSATE SODIUM 100 MG PO CAPS
100.0000 mg | ORAL_CAPSULE | Freq: Two times a day (BID) | ORAL | Status: DC
  Administered 2021-03-21 – 2021-04-16 (×48): 100 mg via ORAL
  Filled 2021-03-21 (×52): qty 1

## 2021-03-21 MED ORDER — FUROSEMIDE 10 MG/ML IJ SOLN
40.0000 mg | Freq: Once | INTRAMUSCULAR | Status: AC
  Administered 2021-03-21: 40 mg via INTRAVENOUS
  Filled 2021-03-21: qty 4

## 2021-03-21 MED ORDER — SODIUM CHLORIDE 0.9% FLUSH: 3.0000 mL | INTRAVENOUS | Status: DC | PRN

## 2021-03-21 MED ORDER — PREDNISONE 20 MG PO TABS
40.0000 mg | ORAL_TABLET | Freq: Every day | ORAL | Status: DC
  Administered 2021-03-21 – 2021-03-29 (×9): 40 mg via ORAL
  Filled 2021-03-21 (×9): qty 2

## 2021-03-21 MED ORDER — ONDANSETRON HCL 4 MG/2ML IJ SOLN: 4.0000 mg | Freq: Four times a day (QID) | INTRAMUSCULAR | Status: DC | PRN

## 2021-03-21 MED ORDER — FLUOXETINE HCL 20 MG PO CAPS
20.0000 mg | ORAL_CAPSULE | Freq: Every day | ORAL | Status: DC
  Administered 2021-03-21 – 2021-04-17 (×28): 20 mg via ORAL
  Filled 2021-03-21 (×28): qty 1

## 2021-03-21 MED ORDER — GUAIFENESIN ER 600 MG PO TB12
600.0000 mg | ORAL_TABLET | Freq: Two times a day (BID) | ORAL | Status: DC
  Administered 2021-03-21 – 2021-04-17 (×54): 600 mg via ORAL
  Filled 2021-03-21 (×54): qty 1

## 2021-03-21 MED ORDER — CLONAZEPAM 0.25 MG PO TBDP
0.2500 mg | ORAL_TABLET | Freq: Two times a day (BID) | ORAL | Status: DC
  Administered 2021-03-21 – 2021-04-17 (×54): 0.25 mg via ORAL
  Filled 2021-03-21 (×54): qty 1

## 2021-03-21 MED ORDER — METOPROLOL SUCCINATE ER 25 MG PO TB24
25.0000 mg | ORAL_TABLET | Freq: Every day | ORAL | Status: DC
  Administered 2021-03-22 – 2021-04-17 (×27): 25 mg via ORAL
  Filled 2021-03-21 (×27): qty 1

## 2021-03-21 MED ORDER — INSULIN ASPART 100 UNIT/ML IJ SOLN
0.0000 [IU] | Freq: Three times a day (TID) | INTRAMUSCULAR | Status: DC
  Administered 2021-03-22 (×2): 2 [IU] via SUBCUTANEOUS
  Administered 2021-03-23: 3 [IU] via SUBCUTANEOUS
  Administered 2021-03-23: 1 [IU] via SUBCUTANEOUS
  Administered 2021-03-23 – 2021-03-25 (×4): 2 [IU] via SUBCUTANEOUS
  Administered 2021-03-25: 3 [IU] via SUBCUTANEOUS
  Administered 2021-03-25: 2 [IU] via SUBCUTANEOUS
  Administered 2021-03-26: 1 [IU] via SUBCUTANEOUS
  Administered 2021-03-26: 2 [IU] via SUBCUTANEOUS
  Administered 2021-03-26: 3 [IU] via SUBCUTANEOUS
  Administered 2021-03-27: 7 [IU] via SUBCUTANEOUS
  Administered 2021-03-27: 5 [IU] via SUBCUTANEOUS
  Administered 2021-03-27: 2 [IU] via SUBCUTANEOUS
  Administered 2021-03-28: 7 [IU] via SUBCUTANEOUS
  Administered 2021-03-28: 5 [IU] via SUBCUTANEOUS
  Administered 2021-03-28: 1 [IU] via SUBCUTANEOUS
  Administered 2021-03-29: 9 [IU] via SUBCUTANEOUS
  Administered 2021-03-29: 3 [IU] via SUBCUTANEOUS

## 2021-03-21 MED ORDER — METOPROLOL SUCCINATE ER 25 MG PO TB24: 25.0000 mg | ORAL_TABLET | Freq: Every day | ORAL | Status: DC

## 2021-03-21 NOTE — Significant Event (Signed)
Upon reassessment patient had removed 02 n/c and was sleep. 02 sat 80s after reapplying O2 at 6l high-flow sats are 96%

## 2021-03-21 NOTE — ED Notes (Addendum)
Pt keeps taking oxygen off
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
## 2021-03-21 NOTE — Plan of Care (Signed)
  Problem: Activity: Goal: Capacity to carry out activities will improve Outcome: Progressing   Problem: Cardiac: Goal: Ability to achieve and maintain adequate cardiopulmonary perfusion will improve Outcome: Progressing   Problem: Activity: Goal: Risk for activity intolerance will decrease Outcome: Progressing   Problem: Nutrition: Goal: Adequate nutrition will be maintained Outcome: Completed/Met
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
## 2021-03-21 NOTE — Progress Notes (Signed)
Upon entering patient's room the patient's SpO2 was reading at 78% on an 80% aerosol trach collar set at 10L. During assessment of the stoma site it was noted to be almost closed. The stoma was dressed with gauze & soft tape. The patient was placed on a HFNC set at 6L & SpO2 improved to 99%. Patient states that he wears [place] at home. RN was made aware.

## 2021-03-21 NOTE — ED Notes (Addendum)
Pt continuously taking himself off oxygen and wheeling himself outside in wheelchair. Pt brought back in and oxygen sats are at 79%, pt educated and told to keep oxygen on. Pt placed on 4L oxygen and saturation brought up to 92%, triage RN notified.

## 2021-03-22 DIAGNOSIS — F3130 Bipolar disorder, current episode depressed, mild or moderate severity, unspecified: Secondary | ICD-10-CM | POA: Diagnosis not present

## 2021-03-22 DIAGNOSIS — J9621 Acute and chronic respiratory failure with hypoxia: Secondary | ICD-10-CM | POA: Diagnosis not present

## 2021-03-22 DIAGNOSIS — I5033 Acute on chronic diastolic (congestive) heart failure: Secondary | ICD-10-CM | POA: Diagnosis not present

## 2021-03-22 DIAGNOSIS — N179 Acute kidney failure, unspecified: Secondary | ICD-10-CM | POA: Diagnosis not present

## 2021-03-22 LAB — BASIC METABOLIC PANEL
Anion gap: 9 (ref 5–15)
BUN: 16 mg/dL (ref 6–20)
CO2: 33 mmol/L — ABNORMAL HIGH (ref 22–32)
Calcium: 9 mg/dL (ref 8.9–10.3)
Chloride: 95 mmol/L — ABNORMAL LOW (ref 98–111)
Creatinine, Ser: 1.1 mg/dL (ref 0.61–1.24)
GFR, Estimated: >60 mL/min (ref >60)
Glucose, Bld: 119 mg/dL — ABNORMAL HIGH (ref 70–99)
Potassium: 4.6 mmol/L (ref 3.5–5.1)
Sodium: 137 mmol/L (ref 135–145)

## 2021-03-22 LAB — GLUCOSE, CAPILLARY
Glucose-Capillary: 176 mg/dL — ABNORMAL HIGH (ref 70–99)
Glucose-Capillary: 177 mg/dL — ABNORMAL HIGH (ref 70–99)
Glucose-Capillary: 150 mg/dL — ABNORMAL HIGH (ref 70–99)
Glucose-Capillary: 221 mg/dL — ABNORMAL HIGH (ref 70–99)

## 2021-03-22 NOTE — Progress Notes (Signed)
PT Cancellation Note  Patient Details Name: Tommy Mathews MRN: [id number] DOB: 1960-12-19   Cancelled Treatment:    Reason Eval/Treat Not Completed: Other (comment). Pt sleeping soundly. Did not arouse to tactile/verbal stimuli. Will try again later.   Angelina Ok [facility] 03/22/2021, 9:25 AM Skip Mayer PT Acute Rehabilitation Services Pager [phone] Office [phone]
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
## 2021-03-22 NOTE — [place] FL2 (Signed)
[place] MEDICAID FL2 LEVEL OF CARE SCREENING TOOL     IDENTIFICATION  Patient Name: Tommy Mathews Birthdate: 1961-04-01 Sex: male Admission Date (Current Location): 03/20/2021  [facility] and [place] Number:  Producer, television/film/video and Address:  [facility] [place], [address], [place], [place] [address]      Provider Number: [id number]  Attending Physician Name and Address:  Edsel Petrin, DO  Relative Name and Phone Number:  Orland Dec (mother) [phone]    Current Level of Care: Hospital Recommended Level of Care: Skilled Nursing Facility Prior Approval Number:    Date Approved/Denied:   PASRR Number: [id number]  Discharge Plan: SNF    Current Diagnoses: Patient Active Problem List   Diagnosis Date Noted   Tracheostomy dependence (HCC)    Chronic respiratory failure with hypoxia (HCC)    COPD with acute bronchitis (HCC) 02/01/2021   Polycythemia 02/01/2021   Renal insufficiency 02/01/2021   Acute on chronic diastolic CHF (congestive heart failure) (HCC) 12/14/2019   COPD (chronic obstructive pulmonary disease) (HCC)    Sepsis with acute hypoxic respiratory failure without septic shock (HCC)    Chronic HFrEF (heart failure with reduced ejection fraction) (HCC) 08/24/2019   Thrombocytopenia (HCC) 08/24/2019   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/2018   Bipolar I disorder, most recent episode depressed (HCC) 03/03/2018   Schizophrenia (HCC) 02/27/2018   GI bleed 01/06/2018   Acute on chronic respiratory failure with hypoxia (HCC) 01/06/2018   Pneumonia of both lungs due to infectious organism 01/06/2018   AKI (acute kidney injury) (HCC) 01/06/2018   Insulin-requiring or dependent type II diabetes mellitus (HCC) 01/06/2018    Orientation RESPIRATION BLADDER Height & Weight     Self, Time, Situation, Place  O2 (9 liters [place] 56%) Continent Weight: 98.5 kg Height:  5\' 6"  (167.6 cm)  BEHAVIORAL  SYMPTOMS/MOOD NEUROLOGICAL BOWEL NUTRITION STATUS      Continent Diet (see discharge summary)  AMBULATORY STATUS COMMUNICATION OF NEEDS Skin   Limited Assist Verbally Normal                       Personal Care Assistance Level of Assistance  Bathing, Feeding, Dressing Bathing Assistance: Limited assistance Feeding assistance: Independent Dressing Assistance: Limited assistance     Functional Limitations Info  Sight, Hearing, Speech Sight Info: Adequate Hearing Info: Adequate Speech Info: Adequate    SPECIAL CARE FACTORS FREQUENCY  PT (By licensed PT), OT (By licensed OT)     PT Frequency: 5x/week OT Frequency: 5x/week            Contractures Contractures Info: Not present    Additional Factors Info  Code Status, Allergies Code Status Info: Full Allergies Info: No Known Allergies           Current Medications (03/22/2021):  This is the current hospital active medication list Current Facility-Administered Medications  Medication Dose Route Frequency Provider Last Rate Last Admin   0.9 %  sodium chloride infusion  250 mL Intravenous PRN 03/24/2021 A, MD       acetaminophen (TYLENOL) tablet 650 mg  650 mg Oral Q4H PRN Smith, Rondell A, MD       acetylcysteine (MUCOMYST) 20 % nebulizer / oral solution 4 mL  4 mL Nebulization Q12H PRN Madelyn Flavors A, MD       aspirin chewable tablet 81 mg  81 mg Oral Daily Madelyn Flavors, Rondell A, MD   81 mg at 03/22/21  3614   bisacodyl (DULCOLAX) suppository 10 mg  10 mg Rectal Daily PRN Madelyn Flavors A, MD       budesonide (PULMICORT) nebulizer solution 0.5 mg  0.5 mg Nebulization BID Katrinka Blazing, Rondell A, MD   0.5 mg at 03/22/21 0711   clonazePAM (KLONOPIN) disintegrating tablet 0.25 mg  0.25 mg Oral BID Madelyn Flavors A, MD   0.25 mg at 03/22/21 0951   digoxin (LANOXIN) tablet 0.25 mg  0.25 mg Oral Daily Katrinka Blazing, Rondell A, MD   0.25 mg at 03/22/21 0951   divalproex (DEPAKOTE) DR tablet 500 mg  500 mg Oral BID Madelyn Flavors A, MD    500 mg at 03/22/21 0951   docusate sodium (COLACE) capsule 100 mg  100 mg Oral BID Madelyn Flavors A, MD   100 mg at 03/22/21 0951   FLUoxetine (PROZAC) capsule 20 mg  20 mg Oral Daily Katrinka Blazing, Rondell A, MD   20 mg at 03/22/21 0951   furosemide (LASIX) injection 40 mg  40 mg Intravenous BID Madelyn Flavors A, MD   40 mg at 03/22/21 4315   gabapentin (NEURONTIN) capsule 200 mg  200 mg Oral BID Madelyn Flavors A, MD   200 mg at 03/22/21 0951   guaiFENesin (MUCINEX) 12 hr tablet 600 mg  600 mg Oral Q12H Smith, Rondell A, MD   600 mg at 03/22/21 0951   heparin injection 5,000 Units  5,000 Units Subcutaneous Q8H Smith, Rondell A, MD   5,000 Units at 03/22/21 4008   insulin aspart (novoLOG) injection 0-5 Units  0-5 Units Subcutaneous QHS Smith, Rondell A, MD       insulin aspart (novoLOG) injection 0-9 Units  0-9 Units Subcutaneous TID WC Smith, Rondell A, MD   2 Units at 03/22/21 1200   ipratropium-albuterol (DUONEB) 0.5-2.5 (3) MG/3ML nebulizer solution 3 mL  3 mL Nebulization BID Smith, Rondell A, MD   3 mL at 03/22/21 0710   magnesium oxide (MAG-OX) tablet 400 mg  400 mg Oral BID Madelyn Flavors A, MD   400 mg at 03/22/21 0951   metoprolol succinate (TOPROL-XL) 24 hr tablet 25 mg  25 mg Oral Daily Smith, Rondell A, MD   25 mg at 03/22/21 0951   ondansetron (ZOFRAN) injection 4 mg  4 mg Intravenous Q6H PRN Madelyn Flavors A, MD       pantoprazole (PROTONIX) EC tablet 40 mg  40 mg Oral Daily Smith, Rondell A, MD   40 mg at 03/22/21 0951   predniSONE (DELTASONE) tablet 40 mg  40 mg Oral Q breakfast Madelyn Flavors A, MD   40 mg at 03/22/21 0951   QUEtiapine (SEROQUEL) tablet 200 mg  200 mg Oral QHS Smith, Rondell A, MD   200 mg at 03/21/21 2202   sodium chloride flush (NS) 0.9 % injection 3 mL  3 mL Intravenous Q12H Smith, Rondell A, MD   3 mL at 03/21/21 2253   sodium chloride flush (NS) 0.9 % injection 3 mL  3 mL Intravenous PRN Smith, Rondell A, MD       sodium chloride HYPERTONIC 3 % nebulizer solution 4  mL  4 mL Nebulization Daily Katrinka Blazing, Rondell A, MD   4 mL at 03/22/21 0710     Discharge Medications: Please see discharge summary for a list of discharge medications.  Relevant Imaging Results:  Relevant Lab Results:   Additional Information [phone]  [place], [place]

## 2021-03-23 DIAGNOSIS — J9621 Acute and chronic respiratory failure with hypoxia: Secondary | ICD-10-CM | POA: Diagnosis not present

## 2021-03-23 DIAGNOSIS — I5033 Acute on chronic diastolic (congestive) heart failure: Secondary | ICD-10-CM | POA: Diagnosis not present

## 2021-03-23 DIAGNOSIS — N179 Acute kidney failure, unspecified: Secondary | ICD-10-CM | POA: Diagnosis not present

## 2021-03-23 DIAGNOSIS — F3130 Bipolar disorder, current episode depressed, mild or moderate severity, unspecified: Secondary | ICD-10-CM | POA: Diagnosis not present

## 2021-03-23 LAB — GLUCOSE, CAPILLARY
Glucose-Capillary: 163 mg/dL — ABNORMAL HIGH (ref 70–99)
Glucose-Capillary: 124 mg/dL — ABNORMAL HIGH (ref 70–99)
Glucose-Capillary: 202 mg/dL — ABNORMAL HIGH (ref 70–99)
Glucose-Capillary: 164 mg/dL — ABNORMAL HIGH (ref 70–99)

## 2021-03-23 LAB — BASIC METABOLIC PANEL
Anion gap: 9 (ref 5–15)
BUN: 24 mg/dL — ABNORMAL HIGH (ref 6–20)
CO2: 29 mmol/L (ref 22–32)
Calcium: 8.5 mg/dL — ABNORMAL LOW (ref 8.9–10.3)
Chloride: 97 mmol/L — ABNORMAL LOW (ref 98–111)
Creatinine, Ser: 1.22 mg/dL (ref 0.61–1.24)
GFR, Estimated: >60 mL/min (ref >60)
Glucose, Bld: 120 mg/dL — ABNORMAL HIGH (ref 70–99)
Potassium: 4.1 mmol/L (ref 3.5–5.1)
Sodium: 135 mmol/L (ref 135–145)

## 2021-03-23 MED ORDER — DIVALPROEX SODIUM 250 MG PO DR TAB
750.0000 mg | DELAYED_RELEASE_TABLET | Freq: Two times a day (BID) | ORAL | Status: DC
  Administered 2021-03-23 – 2021-04-17 (×50): 750 mg via ORAL
  Filled 2021-03-23 (×50): qty 3
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
## 2021-03-23 NOTE — Progress Notes (Signed)
RT at bedside to find pt's HFNC to be off. Pt was in no distress and no complaints. Pt's SpO2 read 80% on room air. RT placed pt back on 8L HFNC while giving breathing treatment. Pt's SpO2 now 95% on 8L HFNC. RT will continue to monitor pt.

## 2021-03-23 NOTE — Progress Notes (Signed)
O2 sat drops to 75% on room air when pt pulls the high flow nasal cannula off.

## 2021-03-24 DIAGNOSIS — F3130 Bipolar disorder, current episode depressed, mild or moderate severity, unspecified: Secondary | ICD-10-CM | POA: Diagnosis not present

## 2021-03-24 DIAGNOSIS — J9621 Acute and chronic respiratory failure with hypoxia: Secondary | ICD-10-CM | POA: Diagnosis not present

## 2021-03-24 DIAGNOSIS — N179 Acute kidney failure, unspecified: Secondary | ICD-10-CM | POA: Diagnosis not present

## 2021-03-24 DIAGNOSIS — I5033 Acute on chronic diastolic (congestive) heart failure: Secondary | ICD-10-CM | POA: Diagnosis not present

## 2021-03-24 LAB — GLUCOSE, CAPILLARY
Glucose-Capillary: 112 mg/dL — ABNORMAL HIGH (ref 70–99)
Glucose-Capillary: 192 mg/dL — ABNORMAL HIGH (ref 70–99)
Glucose-Capillary: 152 mg/dL — ABNORMAL HIGH (ref 70–99)
Glucose-Capillary: 302 mg/dL — ABNORMAL HIGH (ref 70–99)

## 2021-03-24 LAB — BASIC METABOLIC PANEL
Anion gap: 10 (ref 5–15)
BUN: 23 mg/dL — ABNORMAL HIGH (ref 6–20)
CO2: 33 mmol/L — ABNORMAL HIGH (ref 22–32)
Calcium: 9 mg/dL (ref 8.9–10.3)
Chloride: 93 mmol/L — ABNORMAL LOW (ref 98–111)
Creatinine, Ser: 1.13 mg/dL (ref 0.61–1.24)
GFR, Estimated: >60 mL/min (ref >60)
Glucose, Bld: 119 mg/dL — ABNORMAL HIGH (ref 70–99)
Potassium: 4 mmol/L (ref 3.5–5.1)
Sodium: 136 mmol/L (ref 135–145)

## 2021-03-24 LAB — HEMOGLOBIN AND HEMATOCRIT, BLOOD
HCT: 65.4 % — ABNORMAL HIGH (ref 39.0–52.0)
Hemoglobin: 19.4 g/dL — ABNORMAL HIGH (ref 13.0–17.0)

## 2021-03-24 MED ORDER — IPRATROPIUM-ALBUTEROL 0.5-2.5 (3) MG/3ML IN SOLN
3.0000 mL | Freq: Four times a day (QID) | RESPIRATORY_TRACT | Status: DC | PRN
  Administered 2021-03-29 – 2021-04-02 (×4): 3 mL via RESPIRATORY_TRACT
  Filled 2021-03-24 (×4): qty 3
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
## 2021-03-25 DIAGNOSIS — F3130 Bipolar disorder, current episode depressed, mild or moderate severity, unspecified: Secondary | ICD-10-CM | POA: Diagnosis not present

## 2021-03-25 DIAGNOSIS — N179 Acute kidney failure, unspecified: Secondary | ICD-10-CM | POA: Diagnosis not present

## 2021-03-25 DIAGNOSIS — I5033 Acute on chronic diastolic (congestive) heart failure: Secondary | ICD-10-CM | POA: Diagnosis not present

## 2021-03-25 DIAGNOSIS — J9621 Acute and chronic respiratory failure with hypoxia: Secondary | ICD-10-CM | POA: Diagnosis not present

## 2021-03-25 LAB — BASIC METABOLIC PANEL
Anion gap: 13 (ref 5–15)
BUN: 26 mg/dL — ABNORMAL HIGH (ref 6–20)
CO2: 32 mmol/L (ref 22–32)
Calcium: 9.1 mg/dL (ref 8.9–10.3)
Chloride: 88 mmol/L — ABNORMAL LOW (ref 98–111)
Creatinine, Ser: 1.17 mg/dL (ref 0.61–1.24)
GFR, Estimated: >60 mL/min (ref >60)
Glucose, Bld: 102 mg/dL — ABNORMAL HIGH (ref 70–99)
Potassium: 4.9 mmol/L (ref 3.5–5.1)
Sodium: 133 mmol/L — ABNORMAL LOW (ref 135–145)

## 2021-03-25 LAB — GLUCOSE, CAPILLARY
Glucose-Capillary: 187 mg/dL — ABNORMAL HIGH (ref 70–99)
Glucose-Capillary: 153 mg/dL — ABNORMAL HIGH (ref 70–99)
Glucose-Capillary: 219 mg/dL — ABNORMAL HIGH (ref 70–99)
Glucose-Capillary: 199 mg/dL — ABNORMAL HIGH (ref 70–99)

## 2021-03-25 NOTE — Progress Notes (Signed)
RT removed prior stoma dressing, cleaned the site with gauze and sterile water, dried the site and then applied a new dressing.  Patient tolerated well.  RT instructed the patient on the use of a flutter valve. Patient able to demonstrate back good technique.
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
## 2021-03-25 NOTE — Progress Notes (Addendum)
Soiled stoma dressing removed, site cleaned with gauze and sterile water, and new dressing applied. Pt tolerated well. RT will continue to monitor.

## 2021-03-26 DIAGNOSIS — J9621 Acute and chronic respiratory failure with hypoxia: Secondary | ICD-10-CM | POA: Diagnosis not present

## 2021-03-26 DIAGNOSIS — F3130 Bipolar disorder, current episode depressed, mild or moderate severity, unspecified: Secondary | ICD-10-CM | POA: Diagnosis not present

## 2021-03-26 DIAGNOSIS — N179 Acute kidney failure, unspecified: Secondary | ICD-10-CM | POA: Diagnosis not present

## 2021-03-26 DIAGNOSIS — I5033 Acute on chronic diastolic (congestive) heart failure: Secondary | ICD-10-CM | POA: Diagnosis not present

## 2021-03-26 LAB — BASIC METABOLIC PANEL
Anion gap: 8 (ref 5–15)
BUN: 21 mg/dL — ABNORMAL HIGH (ref 6–20)
CO2: 35 mmol/L — ABNORMAL HIGH (ref 22–32)
Calcium: 8.9 mg/dL (ref 8.9–10.3)
Chloride: 94 mmol/L — ABNORMAL LOW (ref 98–111)
Creatinine, Ser: 1.12 mg/dL (ref 0.61–1.24)
GFR, Estimated: >60 mL/min (ref >60)
Glucose, Bld: 108 mg/dL — ABNORMAL HIGH (ref 70–99)
Potassium: 4.1 mmol/L (ref 3.5–5.1)
Sodium: 137 mmol/L (ref 135–145)

## 2021-03-26 LAB — GLUCOSE, CAPILLARY
Glucose-Capillary: 122 mg/dL — ABNORMAL HIGH (ref 70–99)
Glucose-Capillary: 182 mg/dL — ABNORMAL HIGH (ref 70–99)
Glucose-Capillary: 228 mg/dL — ABNORMAL HIGH (ref 70–99)
Glucose-Capillary: 176 mg/dL — ABNORMAL HIGH (ref 70–99)

## 2021-03-26 LAB — VALPROIC ACID LEVEL: Valproic Acid Lvl: 51 ug/mL (ref 50.0–100.0)
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
## 2021-03-26 NOTE — Progress Notes (Signed)
RT cleaned stoma site and redressed it. Patient tolerated well.

## 2021-03-27 DIAGNOSIS — J9621 Acute and chronic respiratory failure with hypoxia: Secondary | ICD-10-CM | POA: Diagnosis not present

## 2021-03-27 DIAGNOSIS — I5033 Acute on chronic diastolic (congestive) heart failure: Secondary | ICD-10-CM | POA: Diagnosis not present

## 2021-03-27 DIAGNOSIS — F3130 Bipolar disorder, current episode depressed, mild or moderate severity, unspecified: Secondary | ICD-10-CM | POA: Diagnosis not present

## 2021-03-27 DIAGNOSIS — N179 Acute kidney failure, unspecified: Secondary | ICD-10-CM | POA: Diagnosis not present

## 2021-03-27 LAB — GLUCOSE, CAPILLARY
Glucose-Capillary: 182 mg/dL — ABNORMAL HIGH (ref 70–99)
Glucose-Capillary: 122 mg/dL — ABNORMAL HIGH (ref 70–99)
Glucose-Capillary: 263 mg/dL — ABNORMAL HIGH (ref 70–99)
Glucose-Capillary: 311 mg/dL — ABNORMAL HIGH (ref 70–99)
Glucose-Capillary: 259 mg/dL — ABNORMAL HIGH (ref 70–99)
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
## 2021-03-27 NOTE — TOC Progression Note (Signed)
Transition of Care [facility]) - Progression Note    Patient Details  Name: Tommy Mathews MRN: [id number] Date of Birth: [birth year]  [facility] CM/SW Contact  Ivette Loyal, [place] Phone Number: 03/27/2021, 2:16 PM  Clinical Narrative:    1415: CSW contacted [facility] to inquire about pt's return to the facility. There was no answer, CSW left a VM and will follow up with [facility].        Expected Discharge Plan and Services                                                 Social Determinants of Health (SDOH) Interventions    Readmission Risk Interventions No flowsheet data found.

## 2021-03-28 ENCOUNTER — Inpatient Hospital Stay (HOSPITAL_COMMUNITY): Payer: Medicaid Other

## 2021-03-28 DIAGNOSIS — N179 Acute kidney failure, unspecified: Secondary | ICD-10-CM | POA: Diagnosis not present

## 2021-03-28 DIAGNOSIS — I5033 Acute on chronic diastolic (congestive) heart failure: Secondary | ICD-10-CM | POA: Diagnosis not present

## 2021-03-28 DIAGNOSIS — F3130 Bipolar disorder, current episode depressed, mild or moderate severity, unspecified: Secondary | ICD-10-CM | POA: Diagnosis not present

## 2021-03-28 DIAGNOSIS — J9621 Acute and chronic respiratory failure with hypoxia: Secondary | ICD-10-CM | POA: Diagnosis not present

## 2021-03-28 LAB — BASIC METABOLIC PANEL
Anion gap: 14 (ref 5–15)
BUN: 17 mg/dL (ref 6–20)
CO2: 34 mmol/L — ABNORMAL HIGH (ref 22–32)
Calcium: 8.9 mg/dL (ref 8.9–10.3)
Chloride: 89 mmol/L — ABNORMAL LOW (ref 98–111)
Creatinine, Ser: 1.08 mg/dL (ref 0.61–1.24)
GFR, Estimated: >60 mL/min (ref >60)
Glucose, Bld: 112 mg/dL — ABNORMAL HIGH (ref 70–99)
Potassium: 3.4 mmol/L — ABNORMAL LOW (ref 3.5–5.1)
Sodium: 137 mmol/L (ref 135–145)

## 2021-03-28 LAB — GLUCOSE, CAPILLARY
Glucose-Capillary: 127 mg/dL — ABNORMAL HIGH (ref 70–99)
Glucose-Capillary: 273 mg/dL — ABNORMAL HIGH (ref 70–99)
Glucose-Capillary: 327 mg/dL — ABNORMAL HIGH (ref 70–99)
Glucose-Capillary: 395 mg/dL — ABNORMAL HIGH (ref 70–99)

## 2021-03-28 LAB — MAGNESIUM: Magnesium: 2 mg/dL (ref 1.7–2.4)

## 2021-03-28 MED ORDER — INSULIN ASPART 100 UNIT/ML IJ SOLN
5.0000 [IU] | Freq: Three times a day (TID) | INTRAMUSCULAR | Status: DC
  Administered 2021-03-28 – 2021-04-17 (×58): 5 [IU] via SUBCUTANEOUS

## 2021-03-28 MED ORDER — POTASSIUM CHLORIDE CRYS ER 20 MEQ PO TBCR
40.0000 meq | EXTENDED_RELEASE_TABLET | Freq: Once | ORAL | Status: AC
  Administered 2021-03-28: 40 meq via ORAL
  Filled 2021-03-28: qty 2

## 2021-03-28 NOTE — Progress Notes (Signed)
Inpatient Diabetes Program Recommendations  AACE/ADA: New Consensus Statement on Inpatient Glycemic Control (2015)  Target Ranges:  Prepandial:   less than 140 mg/dL      Peak postprandial:   less than 180 mg/dL (1-2 hours)      Critically ill patients:  140 - 180 mg/dL   Results for [name], [name] (MRN [id number]) as of 03/28/2021 06:43  Ref. Range 03/27/2021 08:25 03/27/2021 11:21 03/27/2021 16:36 03/27/2021 21:07  Glucose-Capillary Latest Ref Range: 70 - 99 mg/dL 622 (H)  2 units NOVOLOG  263 (H)  5 units NOVOLOG  311 (H)  7 units NOVOLOG  259 (H)  3 units NOVOLOG       Admit with: Acute hypoxic respiratory failure secondary to CHF exacerbation/COPD exacerbation  History: DM  Home DM Meds: Lantus 20 units QHS  Current Orders: Novolog Sensitive Correction Scale/ SSI (0-9 units) TID AC + HS     MD- Note patient getting Prednisone 40 mg Daily.  AM CBGs OK.  Afternoon CBGs continue to stay elevated.  Pt eating 100% of meals.  Please consider addition of Novolog Meal Coverage to current inpatient insulin regimen while pt remains on Prednisone:  Novolog 5 units TID with meals  Hold if pt eats <50% of meal, Hold if pt NPO     --Will follow patient during hospitalization--  Ambrose Finland RN, MSN, CDE Diabetes Coordinator Inpatient Glycemic Control Team Team Pager: [phone] (8a-5p)
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
## 2021-03-28 NOTE — Progress Notes (Signed)
Patient with continuous reminders to leave nasal cannula in place.  Patient states he removes it because it is uncomfortable.  Attempts made to reposition the nasal cannula for comfort.  Patient continues to remove. Patient educated on the need for oxygen as he drops to the 70s without.

## 2021-03-29 DIAGNOSIS — N179 Acute kidney failure, unspecified: Secondary | ICD-10-CM | POA: Diagnosis not present

## 2021-03-29 DIAGNOSIS — J9621 Acute and chronic respiratory failure with hypoxia: Secondary | ICD-10-CM | POA: Diagnosis not present

## 2021-03-29 DIAGNOSIS — F3130 Bipolar disorder, current episode depressed, mild or moderate severity, unspecified: Secondary | ICD-10-CM | POA: Diagnosis not present

## 2021-03-29 DIAGNOSIS — I5033 Acute on chronic diastolic (congestive) heart failure: Secondary | ICD-10-CM | POA: Diagnosis not present

## 2021-03-29 LAB — BASIC METABOLIC PANEL
Anion gap: 6 (ref 5–15)
BUN: 9 mg/dL (ref 6–20)
CO2: 24 mmol/L (ref 22–32)
Calcium: 8.2 mg/dL — ABNORMAL LOW (ref 8.9–10.3)
Chloride: 106 mmol/L (ref 98–111)
Creatinine, Ser: 0.98 mg/dL (ref 0.61–1.24)
GFR, Estimated: >60 mL/min (ref >60)
Glucose, Bld: 87 mg/dL (ref 70–99)
Potassium: 3.5 mmol/L (ref 3.5–5.1)
Sodium: 136 mmol/L (ref 135–145)

## 2021-03-29 LAB — GLUCOSE, CAPILLARY
Glucose-Capillary: 215 mg/dL — ABNORMAL HIGH (ref 70–99)
Glucose-Capillary: 419 mg/dL — ABNORMAL HIGH (ref 70–99)
Glucose-Capillary: 347 mg/dL — ABNORMAL HIGH (ref 70–99)
Glucose-Capillary: 231 mg/dL — ABNORMAL HIGH (ref 70–99)

## 2021-03-29 LAB — HEMOGLOBIN AND HEMATOCRIT, BLOOD
HCT: 28.9 % — ABNORMAL LOW (ref 39.0–52.0)
Hemoglobin: 10.1 g/dL — ABNORMAL LOW (ref 13.0–17.0)

## 2021-03-29 MED ORDER — INSULIN ASPART 100 UNIT/ML IJ SOLN: 5.0000 [IU] | Freq: Three times a day (TID) | INTRAMUSCULAR | Status: DC

## 2021-03-29 MED ORDER — INSULIN GLARGINE 100 UNIT/ML ~~LOC~~ SOLN
10.0000 [IU] | Freq: Every day | SUBCUTANEOUS | Status: DC
  Administered 2021-03-29 – 2021-04-17 (×20): 10 [IU] via SUBCUTANEOUS
  Filled 2021-03-29 (×21): qty 0.1

## 2021-03-29 MED ORDER — INSULIN ASPART 100 UNIT/ML IJ SOLN
0.0000 [IU] | Freq: Three times a day (TID) | INTRAMUSCULAR | Status: DC
  Administered 2021-03-29 – 2021-03-30 (×2): 11 [IU] via SUBCUTANEOUS
  Administered 2021-03-30: 3 [IU] via SUBCUTANEOUS
  Administered 2021-03-30: 2 [IU] via SUBCUTANEOUS
  Administered 2021-03-31: 5 [IU] via SUBCUTANEOUS
  Administered 2021-03-31: 11 [IU] via SUBCUTANEOUS
  Administered 2021-04-01: 2 [IU] via SUBCUTANEOUS
  Administered 2021-04-01: 5 [IU] via SUBCUTANEOUS
  Administered 2021-04-01 – 2021-04-02 (×2): 3 [IU] via SUBCUTANEOUS
  Administered 2021-04-02: 5 [IU] via SUBCUTANEOUS
  Administered 2021-04-02: 2 [IU] via SUBCUTANEOUS
  Administered 2021-04-03: 8 [IU] via SUBCUTANEOUS
  Administered 2021-04-03: 3 [IU] via SUBCUTANEOUS
  Administered 2021-04-04: 5 [IU] via SUBCUTANEOUS
  Administered 2021-04-04: 2 [IU] via SUBCUTANEOUS
  Administered 2021-04-04: 3 [IU] via SUBCUTANEOUS
  Administered 2021-04-05: 2 [IU] via SUBCUTANEOUS
  Administered 2021-04-05 – 2021-04-06 (×3): 3 [IU] via SUBCUTANEOUS
  Administered 2021-04-06: 8 [IU] via SUBCUTANEOUS
  Administered 2021-04-06 – 2021-04-07 (×2): 2 [IU] via SUBCUTANEOUS
  Administered 2021-04-07: 3 [IU] via SUBCUTANEOUS
  Administered 2021-04-07 – 2021-04-08 (×3): 2 [IU] via SUBCUTANEOUS
  Administered 2021-04-08 – 2021-04-09 (×2): 3 [IU] via SUBCUTANEOUS
  Administered 2021-04-09: 2 [IU] via SUBCUTANEOUS
  Administered 2021-04-09: 3 [IU] via SUBCUTANEOUS
  Administered 2021-04-10: 2 [IU] via SUBCUTANEOUS
  Administered 2021-04-10 – 2021-04-11 (×2): 3 [IU] via SUBCUTANEOUS
  Administered 2021-04-11 (×2): 2 [IU] via SUBCUTANEOUS
  Administered 2021-04-11: 3 [IU] via SUBCUTANEOUS
  Administered 2021-04-12 (×2): 2 [IU] via SUBCUTANEOUS
  Administered 2021-04-12: 8 [IU] via SUBCUTANEOUS
  Administered 2021-04-13: 5 [IU] via SUBCUTANEOUS
  Administered 2021-04-13 – 2021-04-15 (×5): 2 [IU] via SUBCUTANEOUS
  Administered 2021-04-15: 8 [IU] via SUBCUTANEOUS
  Administered 2021-04-16 – 2021-04-17 (×3): 3 [IU] via SUBCUTANEOUS
  Administered 2021-04-17: 2 [IU] via SUBCUTANEOUS
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
## 2021-03-29 NOTE — Progress Notes (Signed)
Inpatient Diabetes Program Recommendations  AACE/ADA: New Consensus Statement on Inpatient Glycemic Control (2015)  Target Ranges:  Prepandial:   less than 140 mg/dL      Peak postprandial:   less than 180 mg/dL (1-2 hours)      Critically ill patients:  140 - 180 mg/dL   Results for SUNNY, GAINS (MRN [id number]) as of 03/29/2021 11:24  Ref. Range 03/28/2021 06:06 03/28/2021 11:05 03/28/2021 16:12 03/28/2021 20:55  Glucose-Capillary Latest Ref Range: 70 - 99 mg/dL 295 (H)  1 unit NOVOLOG  273 (H)  5 units NOVOLOG  327 (H)  12 units NOVOLOG  395 (H)  5 units NOVOLOG    Results for TAYSOM, GLYMPH (MRN [id number]) as of 03/29/2021 11:24  Ref. Range 03/29/2021 05:07 03/29/2021 11:13  Glucose-Capillary Latest Ref Range: 70 - 99 mg/dL 301 (H)  8 units NOVOLOG  419 (H)     Admit with: Acute hypoxic respiratory failure secondary to CHF exacerbation/COPD exacerbation   History: DM   Home DM Meds: Lantus 20 units QHS   Current Orders: Novolog Sensitive Correction Scale/ SSI (0-9 units) TID AC + HS     Novolog 5 units TID with meals         MD- Note patient getting Prednisone 40 mg Daily.  Afternoon CBGs continue to stay severely elevated likely due to the Prednisone.  Pt eating 100% of meals.  Please consider:  1. Start Lantus 10 units QHS (50% home dose)  2. Increase the Novolog Meal Coverage to 8 units TID with meals    --Will follow patient during hospitalization--  Ambrose Finland RN, MSN, CDE Diabetes Coordinator Inpatient Glycemic Control Team Team Pager: [phone] (8a-5p)

## 2021-03-30 DIAGNOSIS — N179 Acute kidney failure, unspecified: Secondary | ICD-10-CM | POA: Diagnosis not present

## 2021-03-30 DIAGNOSIS — J9621 Acute and chronic respiratory failure with hypoxia: Secondary | ICD-10-CM | POA: Diagnosis not present

## 2021-03-30 DIAGNOSIS — F3130 Bipolar disorder, current episode depressed, mild or moderate severity, unspecified: Secondary | ICD-10-CM | POA: Diagnosis not present

## 2021-03-30 DIAGNOSIS — I5033 Acute on chronic diastolic (congestive) heart failure: Secondary | ICD-10-CM | POA: Diagnosis not present

## 2021-03-30 LAB — GLUCOSE, CAPILLARY
Glucose-Capillary: 310 mg/dL — ABNORMAL HIGH (ref 70–99)
Glucose-Capillary: 122 mg/dL — ABNORMAL HIGH (ref 70–99)
Glucose-Capillary: 176 mg/dL — ABNORMAL HIGH (ref 70–99)
Glucose-Capillary: 239 mg/dL — ABNORMAL HIGH (ref 70–99)

## 2021-03-30 LAB — VALPROIC ACID LEVEL: Valproic Acid Lvl: 41 ug/mL — ABNORMAL LOW (ref 50.0–100.0)

## 2021-03-30 LAB — RESP PANEL BY RT-PCR (FLU A&B, COVID) ARPGX2
Influenza A by PCR: NEGATIVE
Influenza B by PCR: NEGATIVE
SARS Coronavirus 2 by RT PCR: NEGATIVE

## 2021-03-30 MED ORDER — DIVALPROEX SODIUM 250 MG PO DR TAB: 750.0000 mg | DELAYED_RELEASE_TABLET | Freq: Two times a day (BID) | ORAL | 0 refills | Status: DC

## 2021-03-30 MED ORDER — QUETIAPINE FUMARATE 50 MG PO TABS: 50.0000 mg | ORAL_TABLET | Freq: Every morning | ORAL | 0 refills | Status: DC

## 2021-03-30 MED ORDER — QUETIAPINE FUMARATE 50 MG PO TABS
50.0000 mg | ORAL_TABLET | Freq: Every morning | ORAL | Status: DC
  Administered 2021-03-31 – 2021-04-17 (×14): 50 mg via ORAL
  Filled 2021-03-30 (×16): qty 1

## 2021-03-30 MED ORDER — CLONAZEPAM 0.5 MG PO TABS: 0.2500 mg | ORAL_TABLET | Freq: Two times a day (BID) | ORAL | 0 refills | Status: DC

## 2021-03-30 NOTE — Progress Notes (Signed)
Stoma dressing removed, site cleaned and dried, new gauze secured with white tape. Pt tolerated well. RT will continue to monitor.
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
## 2021-03-31 LAB — GLUCOSE, CAPILLARY
Glucose-Capillary: 326 mg/dL — ABNORMAL HIGH (ref 70–99)
Glucose-Capillary: 107 mg/dL — ABNORMAL HIGH (ref 70–99)
Glucose-Capillary: 217 mg/dL — ABNORMAL HIGH (ref 70–99)
Glucose-Capillary: 276 mg/dL — ABNORMAL HIGH (ref 70–99)

## 2021-03-31 NOTE — Progress Notes (Signed)
Patient continue to be stable for discharge.  Currently working to be transferred to a skilled nursing facility.

## 2021-03-31 NOTE — TOC Progression Note (Signed)
Transition of Care [facility]) - Progression Note    Patient Details  Name: Tommy Mathews MRN: [id number] Date of Birth: Apr 13, 1961  Transition of Care [facility]) CM/SW Contact  Ivette Loyal, [place] Phone Number: 03/31/2021, 10:29 AM  Clinical Narrative:    CSW spoke with Rasheema at [facility] who states that the facility will not accept pt back. CSW will fax pt out for LTC bed options.       Expected Discharge Plan and Services           Expected Discharge Date: 03/31/21                                     Social Determinants of Health (SDOH) Interventions    Readmission Risk Interventions No flowsheet data found.

## 2021-03-31 NOTE — Plan of Care (Signed)
  Problem: Education: Goal: Ability to demonstrate management of disease process will improve Outcome: Progressing Goal: Ability to verbalize understanding of medication therapies will improve Outcome: Progressing Goal: Individualized Educational Video(s) Outcome: Progressing   Problem: Activity: Goal: Capacity to carry out activities will improve Outcome: Progressing   

## 2021-03-31 NOTE — TOC Progression Note (Addendum)
Transition of Care [facility]) - Progression Note    Patient Details  Name: Mayer Vondrak MRN: [id number] Date of Birth: Feb 21, 1961  Transition of Care [facility]) CM/SW Contact  Ivette Loyal, [place] Phone Number: 03/31/2021, 2:37 PM  Clinical Narrative:    1436: CSW spoke with pt sister Aggie Cosier who stated her mother is the primary contact but they live together and are not able to care for pt at home. Aggie Cosier wants CSW to follow up with any facility that will accept pt for LTC. CSW faxed pt out to [facility] and surrounding areas for LTC placement. CSW will follow up.        Expected Discharge Plan and Services           Expected Discharge Date: 03/31/21                                     Social Determinants of Health (SDOH) Interventions    Readmission Risk Interventions No flowsheet data found.

## 2021-03-31 NOTE — Progress Notes (Signed)
Old dressing removed from stoma, site cleaned with sterile water, dried and redressed with fresh gauze and tape. Pt tolerated well. RT will continue to monitor.

## 2021-04-01 ENCOUNTER — Inpatient Hospital Stay (HOSPITAL_COMMUNITY): Payer: Medicaid Other

## 2021-04-01 DIAGNOSIS — N179 Acute kidney failure, unspecified: Secondary | ICD-10-CM | POA: Diagnosis not present

## 2021-04-01 DIAGNOSIS — J9621 Acute and chronic respiratory failure with hypoxia: Secondary | ICD-10-CM | POA: Diagnosis not present

## 2021-04-01 DIAGNOSIS — F3130 Bipolar disorder, current episode depressed, mild or moderate severity, unspecified: Secondary | ICD-10-CM | POA: Diagnosis not present

## 2021-04-01 DIAGNOSIS — J441 Chronic obstructive pulmonary disease with (acute) exacerbation: Secondary | ICD-10-CM | POA: Diagnosis not present

## 2021-04-01 LAB — GLUCOSE, CAPILLARY
Glucose-Capillary: 215 mg/dL — ABNORMAL HIGH (ref 70–99)
Glucose-Capillary: 153 mg/dL — ABNORMAL HIGH (ref 70–99)
Glucose-Capillary: 128 mg/dL — ABNORMAL HIGH (ref 70–99)
Glucose-Capillary: 249 mg/dL — ABNORMAL HIGH (ref 70–99)

## 2021-04-01 MED ORDER — FUROSEMIDE 40 MG PO TABS
40.0000 mg | ORAL_TABLET | Freq: Two times a day (BID) | ORAL | Status: DC
  Administered 2021-04-01 – 2021-04-10 (×17): 40 mg via ORAL
  Filled 2021-04-01 (×18): qty 1
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
## 2021-04-01 NOTE — Progress Notes (Signed)
Sent Dr. Ella Jubilee secure chat and let MD know that patient is on 8L wall high flow to maintain sat of 92%. Breath sounds coarse, no shortness of breath and no wheezing. MD said he would order chest xray and to give duoneb.

## 2021-04-01 NOTE — TOC Progression Note (Signed)
Transition of Care [facility]) - Progression Note    Patient Details  Name: Tommy Mathews MRN: [id number] Date of Birth: [birth year]  Transition of Care [facility]) CM/SW Contact  Patrice Paradise, [place] Phone Number: [phone] 04/01/2021, 12:10 PM  Clinical Narrative:     CSW spoke with Kenney Houseman from [facility] about bed offer that was extended. Tanya asked if it was a LTC bed that was needed. CSW confirmed that family is wanting a LTC bed for pt. Kenney Houseman stated that she need to follow up with the corporate office because they would have to verify insurance. Kenney Houseman stated that she would give me call back.    TOC team will continue to assist with discharge planning needs.     Expected Discharge Plan and Services           Expected Discharge Date: 03/31/21                                     Social Determinants of Health (SDOH) Interventions    Readmission Risk Interventions No flowsheet data found.

## 2021-04-01 NOTE — Progress Notes (Signed)
Duoneb given per Dr. Ella Jubilee. This RN had called and spoke with Aundra Millet, RT earlier and asked her to give neb to patient. Since neb has not been given RN giving now. Patient has regular unlabored breathing with no respiratory distress. Patient requiring 8 L via wall high flow to maintain o2 sats of 92% so MD gave order to give neb.

## 2021-04-01 NOTE — TOC Progression Note (Signed)
Transition of Care [facility]) - Progression Note    Patient Details  Name: Tommy Mathews MRN: [id number] Date of Birth: [birth year]  Transition [facility] Care [facility]) CM/SW Contact  Patrice Paradise, LCSW Phone Number: [phone] 04/01/2021, 3:14 PM  Clinical Narrative:     CSW spoke with Kenney Houseman again at [facility] and she stated that pt's Medicaid had to be verified before they could accept pt and business office is not going to open until Tuesday. Pt will need COVID prior to being admitted.  TOC team will continue to assist with discharge planning needs.        Expected Discharge Plan and Services           Expected Discharge Date: 03/31/21                                     Social Determinants of Health (SDOH) Interventions    Readmission Risk Interventions No flowsheet data found.

## 2021-04-02 DIAGNOSIS — F3130 Bipolar disorder, current episode depressed, mild or moderate severity, unspecified: Secondary | ICD-10-CM | POA: Diagnosis not present

## 2021-04-02 DIAGNOSIS — N179 Acute kidney failure, unspecified: Secondary | ICD-10-CM | POA: Diagnosis not present

## 2021-04-02 DIAGNOSIS — J9621 Acute and chronic respiratory failure with hypoxia: Secondary | ICD-10-CM | POA: Diagnosis not present

## 2021-04-02 DIAGNOSIS — J441 Chronic obstructive pulmonary disease with (acute) exacerbation: Secondary | ICD-10-CM | POA: Diagnosis not present

## 2021-04-02 LAB — GLUCOSE, CAPILLARY
Glucose-Capillary: 135 mg/dL — ABNORMAL HIGH (ref 70–99)
Glucose-Capillary: 239 mg/dL — ABNORMAL HIGH (ref 70–99)
Glucose-Capillary: 157 mg/dL — ABNORMAL HIGH (ref 70–99)
Glucose-Capillary: 165 mg/dL — ABNORMAL HIGH (ref 70–99)

## 2021-04-02 NOTE — Plan of Care (Signed)
  Problem: Education: Goal: Ability to demonstrate management of disease process will improve Outcome: Progressing Goal: Ability to verbalize understanding of medication therapies will improve Outcome: Progressing Goal: Individualized Educational Video(s) Outcome: Progressing   Problem: Activity: Goal: Capacity to carry out activities will improve Outcome: Progressing   Problem: Cardiac: Goal: Ability to achieve and maintain adequate cardiopulmonary perfusion will improve Outcome: Progressing   Problem: Education: Goal: Knowledge of General Education information will improve Description: Including pain rating scale, medication(s)/side effects and non-pharmacologic comfort measures Outcome: Progressing   Problem: Health Behavior/Discharge Planning: Goal: Ability to manage health-related needs will improve Outcome: Progressing   Problem: Clinical Measurements: Goal: Ability to maintain clinical measurements within normal limits will improve Outcome: Progressing Goal: Will remain free from infection Outcome: Progressing Goal: Diagnostic test results will improve Outcome: Progressing Goal: Respiratory complications will improve Outcome: Progressing Goal: Cardiovascular complication will be avoided Outcome: Progressing   Problem: Activity: Goal: Risk for activity intolerance will decrease Outcome: Progressing   Problem: Coping: Goal: Level of anxiety will decrease Outcome: Progressing   Problem: Elimination: Goal: Will not experience complications related to bowel motility Outcome: Progressing Goal: Will not experience complications related to urinary retention Outcome: Progressing   Problem: Pain Managment: Goal: General experience of comfort will improve Outcome: Progressing   Problem: Safety: Goal: Ability to remain free from injury will improve Outcome: Progressing   Problem: Skin Integrity: Goal: Risk for impaired skin integrity will decrease Outcome:  Progressing   

## 2021-04-02 NOTE — Progress Notes (Signed)
RT NOTES: Applied new dressing to stoma site.
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
## 2021-04-03 DIAGNOSIS — J441 Chronic obstructive pulmonary disease with (acute) exacerbation: Secondary | ICD-10-CM | POA: Diagnosis not present

## 2021-04-03 DIAGNOSIS — N179 Acute kidney failure, unspecified: Secondary | ICD-10-CM | POA: Diagnosis not present

## 2021-04-03 DIAGNOSIS — F3130 Bipolar disorder, current episode depressed, mild or moderate severity, unspecified: Secondary | ICD-10-CM | POA: Diagnosis not present

## 2021-04-03 DIAGNOSIS — J9621 Acute and chronic respiratory failure with hypoxia: Secondary | ICD-10-CM | POA: Diagnosis not present

## 2021-04-03 LAB — GLUCOSE, CAPILLARY
Glucose-Capillary: 151 mg/dL — ABNORMAL HIGH (ref 70–99)
Glucose-Capillary: 261 mg/dL — ABNORMAL HIGH (ref 70–99)
Glucose-Capillary: 109 mg/dL — ABNORMAL HIGH (ref 70–99)
Glucose-Capillary: 206 mg/dL — ABNORMAL HIGH (ref 70–99)

## 2021-04-03 NOTE — Plan of Care (Signed)
  Problem: Education: Goal: Ability to demonstrate management of disease process will improve Outcome: Progressing Goal: Ability to verbalize understanding of medication therapies will improve Outcome: Progressing Goal: Individualized Educational Video(s) Outcome: Progressing   Problem: Activity: Goal: Capacity to carry out activities will improve Outcome: Progressing   Problem: Cardiac: Goal: Ability to achieve and maintain adequate cardiopulmonary perfusion will improve Outcome: Progressing   Problem: Education: Goal: Knowledge of General Education information will improve Description: Including pain rating scale, medication(s)/side effects and non-pharmacologic comfort measures Outcome: Progressing   Problem: Health Behavior/Discharge Planning: Goal: Ability to manage health-related needs will improve Outcome: Progressing   Problem: Clinical Measurements: Goal: Ability to maintain clinical measurements within normal limits will improve Outcome: Progressing Goal: Will remain free from infection Outcome: Progressing Goal: Diagnostic test results will improve Outcome: Progressing Goal: Respiratory complications will improve Outcome: Progressing Goal: Cardiovascular complication will be avoided Outcome: Progressing   Problem: Activity: Goal: Risk for activity intolerance will decrease Outcome: Progressing   Problem: Coping: Goal: Level of anxiety will decrease Outcome: Progressing   Problem: Elimination: Goal: Will not experience complications related to bowel motility Outcome: Progressing Goal: Will not experience complications related to urinary retention Outcome: Progressing   Problem: Pain Managment: Goal: General experience of comfort will improve Outcome: Progressing   Problem: Safety: Goal: Ability to remain free from injury will improve Outcome: Progressing   Problem: Skin Integrity: Goal: Risk for impaired skin integrity will decrease Outcome:  Progressing   
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
## 2021-04-04 LAB — GLUCOSE, CAPILLARY
Glucose-Capillary: 125 mg/dL — ABNORMAL HIGH (ref 70–99)
Glucose-Capillary: 231 mg/dL — ABNORMAL HIGH (ref 70–99)
Glucose-Capillary: 182 mg/dL — ABNORMAL HIGH (ref 70–99)
Glucose-Capillary: 171 mg/dL — ABNORMAL HIGH (ref 70–99)

## 2021-04-04 LAB — RESP PANEL BY RT-PCR (FLU A&B, COVID) ARPGX2
Influenza A by PCR: NEGATIVE
Influenza B by PCR: NEGATIVE
SARS Coronavirus 2 by RT PCR: NEGATIVE

## 2021-04-04 NOTE — Progress Notes (Signed)
Patient is medically stable for discharge.  His oxygenation is 94%, continue airway clearance techniques.  Sodium 133/86, heart rate 68, respiratory 16.  Plan to return to skilled nursing facility.
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
## 2021-04-04 NOTE — Plan of Care (Signed)
  Problem: Education: Goal: Ability to demonstrate management of disease process will improve Outcome: Progressing Goal: Ability to verbalize understanding of medication therapies will improve Outcome: Progressing Goal: Individualized Educational Video(s) Outcome: Progressing   Problem: Activity: Goal: Capacity to carry out activities will improve Outcome: Progressing   Problem: Cardiac: Goal: Ability to achieve and maintain adequate cardiopulmonary perfusion will improve Outcome: Progressing   Problem: Education: Goal: Knowledge of General Education information will improve Description: Including pain rating scale, medication(s)/side effects and non-pharmacologic comfort measures Outcome: Progressing   Problem: Health Behavior/Discharge Planning: Goal: Ability to manage health-related needs will improve Outcome: Progressing   Problem: Clinical Measurements: Goal: Ability to maintain clinical measurements within normal limits will improve Outcome: Progressing Goal: Will remain free from infection Outcome: Progressing Goal: Diagnostic test results will improve Outcome: Progressing Goal: Respiratory complications will improve Outcome: Progressing Goal: Cardiovascular complication will be avoided Outcome: Progressing   Problem: Activity: Goal: Risk for activity intolerance will decrease Outcome: Progressing   Problem: Coping: Goal: Level of anxiety will decrease Outcome: Progressing   Problem: Elimination: Goal: Will not experience complications related to bowel motility Outcome: Progressing Goal: Will not experience complications related to urinary retention Outcome: Progressing   Problem: Pain Managment: Goal: General experience of comfort will improve Outcome: Progressing   Problem: Safety: Goal: Ability to remain free from injury will improve Outcome: Progressing   Problem: Skin Integrity: Goal: Risk for impaired skin integrity will decrease Outcome:  Progressing   

## 2021-04-05 DIAGNOSIS — N179 Acute kidney failure, unspecified: Secondary | ICD-10-CM | POA: Diagnosis not present

## 2021-04-05 DIAGNOSIS — R0602 Shortness of breath: Secondary | ICD-10-CM | POA: Diagnosis not present

## 2021-04-05 DIAGNOSIS — I5033 Acute on chronic diastolic (congestive) heart failure: Secondary | ICD-10-CM | POA: Diagnosis not present

## 2021-04-05 DIAGNOSIS — J431 Panlobular emphysema: Secondary | ICD-10-CM

## 2021-04-05 DIAGNOSIS — J9621 Acute and chronic respiratory failure with hypoxia: Secondary | ICD-10-CM | POA: Diagnosis not present

## 2021-04-05 DIAGNOSIS — F200 Paranoid schizophrenia: Secondary | ICD-10-CM

## 2021-04-05 LAB — GLUCOSE, CAPILLARY
Glucose-Capillary: 161 mg/dL — ABNORMAL HIGH (ref 70–99)
Glucose-Capillary: 174 mg/dL — ABNORMAL HIGH (ref 70–99)
Glucose-Capillary: 122 mg/dL — ABNORMAL HIGH (ref 70–99)
Glucose-Capillary: 170 mg/dL — ABNORMAL HIGH (ref 70–99)

## 2021-04-05 MED ORDER — COVID-19 MRNA VAC-TRIS(PFIZER) 30 MCG/0.3ML IM SUSP
0.3000 mL | Freq: Once | INTRAMUSCULAR | Status: DC
  Filled 2021-04-05: qty 0.3
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
## 2021-04-05 NOTE — Progress Notes (Signed)
Stoma cleansed, dried and dressing applied.

## 2021-04-06 DIAGNOSIS — J9621 Acute and chronic respiratory failure with hypoxia: Secondary | ICD-10-CM | POA: Diagnosis not present

## 2021-04-06 DIAGNOSIS — R0602 Shortness of breath: Secondary | ICD-10-CM | POA: Diagnosis not present

## 2021-04-06 DIAGNOSIS — I5033 Acute on chronic diastolic (congestive) heart failure: Secondary | ICD-10-CM | POA: Diagnosis not present

## 2021-04-06 DIAGNOSIS — N179 Acute kidney failure, unspecified: Secondary | ICD-10-CM | POA: Diagnosis not present

## 2021-04-06 LAB — COMPREHENSIVE METABOLIC PANEL
ALT: 11 U/L (ref 0–44)
AST: 17 U/L (ref 15–41)
Albumin: 2.5 g/dL — ABNORMAL LOW (ref 3.5–5.0)
Alkaline Phosphatase: 49 U/L (ref 38–126)
Anion gap: 7 (ref 5–15)
BUN: 13 mg/dL (ref 6–20)
CO2: 33 mmol/L — ABNORMAL HIGH (ref 22–32)
Calcium: 8.7 mg/dL — ABNORMAL LOW (ref 8.9–10.3)
Chloride: 96 mmol/L — ABNORMAL LOW (ref 98–111)
Creatinine, Ser: 1.08 mg/dL (ref 0.61–1.24)
GFR, Estimated: >60 mL/min (ref >60)
Glucose, Bld: 177 mg/dL — ABNORMAL HIGH (ref 70–99)
Potassium: 4.3 mmol/L (ref 3.5–5.1)
Sodium: 136 mmol/L (ref 135–145)
Total Bilirubin: 0.5 mg/dL (ref 0.3–1.2)
Total Protein: 6.5 g/dL (ref 6.5–8.1)

## 2021-04-06 LAB — CBC WITH DIFFERENTIAL/PLATELET
Abs Immature Granulocytes: 0.04 10*3/uL (ref 0.00–0.07)
Basophils Absolute: 0 10*3/uL (ref 0.0–0.1)
Basophils Relative: 1 %
Eosinophils Absolute: 0.2 10*3/uL (ref 0.0–0.5)
Eosinophils Relative: 2 %
HCT: 60.8 % — ABNORMAL HIGH (ref 39.0–52.0)
Hemoglobin: 17.4 g/dL — ABNORMAL HIGH (ref 13.0–17.0)
Immature Granulocytes: 1 %
Lymphocytes Relative: 27 %
Lymphs Abs: 1.8 10*3/uL (ref 0.7–4.0)
MCH: 24.8 pg — ABNORMAL LOW (ref 26.0–34.0)
MCHC: 28.6 g/dL — ABNORMAL LOW (ref 30.0–36.0)
MCV: 86.6 fL (ref 80.0–100.0)
Monocytes Absolute: 1 10*3/uL (ref 0.1–1.0)
Monocytes Relative: 14 %
Neutro Abs: 3.8 10*3/uL (ref 1.7–7.7)
Neutrophils Relative %: 55 %
Platelets: 149 10*3/uL — ABNORMAL LOW (ref 150–400)
RBC: 7.02 MIL/uL — ABNORMAL HIGH (ref 4.22–5.81)
RDW: 20.7 % — ABNORMAL HIGH (ref 11.5–15.5)
WBC: 6.9 10*3/uL (ref 4.0–10.5)
nRBC: 0 % (ref 0.0–0.2)

## 2021-04-06 LAB — MAGNESIUM: Magnesium: 1.7 mg/dL (ref 1.7–2.4)

## 2021-04-06 LAB — GLUCOSE, CAPILLARY
Glucose-Capillary: 258 mg/dL — ABNORMAL HIGH (ref 70–99)
Glucose-Capillary: 141 mg/dL — ABNORMAL HIGH (ref 70–99)
Glucose-Capillary: 169 mg/dL — ABNORMAL HIGH (ref 70–99)
Glucose-Capillary: 217 mg/dL — ABNORMAL HIGH (ref 70–99)

## 2021-04-06 LAB — PHOSPHORUS: Phosphorus: 3.5 mg/dL (ref 2.5–4.6)

## 2021-04-06 MED ORDER — PNEUMOCOCCAL VAC POLYVALENT 25 MCG/0.5ML IJ INJ
0.5000 mL | INJECTION | INTRAMUSCULAR | Status: DC
  Filled 2021-04-06: qty 0.5
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
## 2021-04-06 NOTE — Plan of Care (Signed)
°  Problem: Education: °Goal: Ability to demonstrate management of disease process will improve °Outcome: Progressing °Goal: Ability to verbalize understanding of medication therapies will improve °Outcome: Progressing °Goal: Individualized Educational Video(s) °Outcome: Progressing °  °

## 2021-04-06 NOTE — Progress Notes (Signed)
Physical Therapy Treatment Patient Details Name: Tommy Mathews MRN: [id number] DOB: [birth year] Today's Date: 04/06/2021    History of Present Illness Pt adm 6/20 with respiratory failure with hypoxia due to diastolic heart failure and COPD exacerbation. PMH - TBI with trach (2019), HTN, afib, DM, schizophrenia, bipolar.    PT Comments    Pt received in chair, pleasantly agreeable to therapy session and with good tolerance and participation in household distance gait trial and seated/standing exercises. Pt requiring up to min guard for gait progression but needs mod cues for technique/safety and assist to stabilize RW during turns/backward stepping. Pt needs up to minA for transfers this date. Emphasis on proper technique for LE therapeutic exercises and safe hand placement with transfers as well as activity pacing/slow deep breaths with exertion to prevent SpO2 desaturation. Pt continues to benefit from PT services to progress toward functional mobility goals. Will plan to perform stair trial or step-ups in room for LE strengthening/endurance building next session.   Follow Up Recommendations  SNF     Equipment Recommendations  None recommended by PT    Recommendations for Other Services       Precautions / Restrictions Precautions Precautions: Fall Precaution Comments: monitor SpO2 Restrictions Weight Bearing Restrictions: No    Mobility  Bed Mobility               General bed mobility comments: pt received/remained in recliner    Transfers Overall transfer level: Needs assistance Equipment used: Rolling walker (2 wheeled);1 person hand held assist Transfers: Sit to/from Stand Sit to Stand: Min guard;Min assist         General transfer comment: min guard to ensure balance when rising, cues for hand placement needed when sitting, minA for stand>sit at times due to decreased eccentric control  Ambulation/Gait Ambulation/Gait assistance: Min guard Gait Distance  (Feet): 100 Feet (75ft x2 with standing break) Assistive device: Rolling walker (2 wheeled) Gait Pattern/deviations: Decreased stride length;Step-through pattern;Wide base of support;Trunk flexed;Decreased dorsiflexion - right;Decreased dorsiflexion - left (downward gaze, decreased B foot clearance) Gait velocity: controlled, WFL   General Gait Details: Fair use of RW but needs mod cues for upright posture/forward gaze, increased foot clearance; tends to crouch with downward gaze often which leads to poor gait dynamics/RW use   Stairs             Wheelchair Mobility    Modified Rankin (Stroke Patients Only)       Balance Overall balance assessment: Needs assistance Sitting-balance support: Feet supported Sitting balance-Leahy Scale: Fair     Standing balance support: Bilateral upper extremity supported;During functional activity Standing balance-Leahy Scale:  (fair static, poor dynamic balance) Standing balance comment: less than fair dynamic balance skills, needs BUE support for dynamic tasks and U UE support for static tasks                            Cognition Arousal/Alertness: Awake/alert Behavior During Therapy: WFL for tasks assessed/performed Overall Cognitive Status: No family/caregiver present to determine baseline cognitive functioning                                 General Comments: TBI previously, slow processing at times but pleasantly cooperative and following 1-step commands well. Some difficulty with 2-step commands.      Exercises Other Exercises Other Exercises: seated BLE AROM: hip flexion, ankle pumps, LAQ, hip ADduction  pillow squeezes x10 reps ea Other Exercises: serial STS x5 reps (needs to push with hands to achieve upright/sit safely)    General Comments General comments (skin integrity, edema, etc.): Pt on 8L HF [place] with SpO2 91-95%; cues for standing breaks/activity pacing as pt fatigues, HR stable 60-70's bpm. No  dizziness reported.      Pertinent Vitals/Pain Pain Assessment: No/denies pain    Home Living                      Prior Function            PT Goals (current goals can now be found in the care plan section) Acute Rehab PT Goals Patient Stated Goal: To get stronger PT Goal Formulation: With patient Time For Goal Achievement: 04/18/21 Potential to Achieve Goals: Good Progress towards PT goals: Progressing toward goals    Frequency    Min 2X/week      PT Plan Current plan remains appropriate    Co-evaluation              AM-PAC PT "6 Clicks" Mobility   Outcome Measure  Help needed turning from your back to your side while in a flat bed without using bedrails?: None Help needed moving from lying on your back to sitting on the side of a flat bed without using bedrails?: None Help needed moving to and from a bed to a chair (including a wheelchair)?: A Little Help needed standing up from a chair using your arms (e.g., wheelchair or bedside chair)?: A Little Help needed to walk in hospital room?: A Little Help needed climbing 3-5 steps with a railing? : A Lot 6 Click Score: 19    End of Session Equipment Utilized During Treatment: Oxygen;Gait belt Activity Tolerance: Patient tolerated treatment well Patient left: with call bell/phone within reach;in chair;with chair alarm set Nurse Communication: Mobility status;Other (comment) (pt requesting water and a snack) PT Visit Diagnosis: Unsteadiness on feet (R26.81);Other abnormalities of gait and mobility (R26.89)     Time: 4503-8882 PT Time Calculation (min) (ACUTE ONLY): 24 min  Charges:  $Gait Training: 8-22 mins $Therapeutic Exercise: 8-22 mins                     Brettany Sydney P., PTA Acute Rehabilitation Services Pager: [phone] Office: [phone]    [place] 04/06/2021, 5:06 PM

## 2021-04-06 NOTE — TOC Progression Note (Signed)
Transition of Care [facility]) - Progression Note    Patient Details  Name: Tommy Mathews MRN: [id number] Date of Birth: [birth year]  [facility] CM/SW Contact  Curlene Labrum, RN Phone Number: 04/06/2021, 12:01 PM  Clinical Narrative:    Case management and MSW met with the patient at the bedside to discuss transitions of care.  The patient was de-cannulated about 1 month ago and continues to cover trach stoma in order to verbally communicate.  The patient continues to use >6 Liters / min of nasal cannula oxygen at this time.  We spoke with the patient regarding placement and the patient is agreeable to placement - most likely group home of ALF placement at this time, considering patient will need oxygen and medication management.  The patient is able to mobilize with a rolling walker per notes.  The patient is unable to return to [facility] skilled nursing facility and both the patient and family are aware that DTP Team will continue to make efforts for placement for the patient.  The patient will be unable to discharge to [facility] or [place] at this time since he is requiring medical needs and management.  CM reached out to the following group homes today for placement:  [facility] - clinicals sent fax [phone] [place] - No answer [facility] - No beds [place] - left message [place] [facility] - No answer [facility] - No anwer [facility] - sent clinicals secure fax [phone]   CM and MSW will continue to follow the patient for transitions of care needs.     Expected Discharge Plan: Group Home Barriers to Discharge: Continued Medical Work up  Expected Discharge Plan and Services Expected Discharge Plan: Group Home In-house Referral: Clinical Social Work Discharge Planning Services: CM Consult   Living arrangements for the past 2 months: [place] Expected Discharge Date: 04/04/21                                      Social Determinants of Health (SDOH) Interventions    Readmission Risk Interventions No flowsheet data found.

## 2021-04-06 NOTE — Significant Event (Signed)
Patient has been demanding attention by yelling out the room and removing oxygen from nose and asking random staff for excess soda and cookies. Along  with inappropriate Educated patient on risk and elevated blood sugars.

## 2021-04-06 NOTE — [place] FL2 (Signed)
[place] MEDICAID FL2 LEVEL OF CARE SCREENING TOOL     IDENTIFICATION  Patient Name: Tommy Mathews Birthdate: 01/20/1961 Sex: male Admission Date (Current Location): 03/20/2021  [place] and [place] Number:  Haynes Bast [id number] O Facility and Address:  [facility] [place], [address] [place], [place] [address]      Provider Number: [id number]  Attending Physician Name and Address:  Drema Dallas, MD  Relative Name and Phone Number:  Orland Dec (mother) [phone]    Current Level of Care: Hospital Recommended Level of Care: [facility] Prior Approval Number:    Date Approved/Denied:   PASRR Number: [id number]  Discharge Plan: Other (Comment) (Group home)    Current Diagnoses: Patient Active Problem List   Diagnosis Date Noted   Tracheostomy dependence (HCC)    Chronic respiratory failure with hypoxia (HCC)    COPD with acute bronchitis (HCC) 02/01/2021   Polycythemia 02/01/2021   Renal insufficiency 02/01/2021   Acute on chronic diastolic CHF (congestive heart failure) (HCC) 12/14/2019   COPD (chronic obstructive pulmonary disease) (HCC)    Sepsis with acute hypoxic respiratory failure without septic shock (HCC)    Chronic HFrEF (heart failure with reduced ejection fraction) (HCC) 08/24/2019   Thrombocytopenia (HCC) 08/24/2019   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/2018   Bipolar I disorder, most recent episode depressed (HCC) 03/03/2018   Schizophrenia (HCC) 02/27/2018   GI bleed 01/06/2018   Acute on chronic respiratory failure with hypoxia (HCC) 01/06/2018   Pneumonia of both lungs due to infectious organism 01/06/2018   AKI (acute kidney injury) (HCC) 01/06/2018   Insulin-requiring or dependent type II diabetes mellitus (HCC) 01/06/2018    Orientation RESPIRATION BLADDER Height & Weight     Self, Time, Situation, Place  O2 (Currently on 7L) Continent Weight: 219 lb 14.4 oz (99.7  kg) Height:  5\' 6"  (167.6 cm)  BEHAVIORAL SYMPTOMS/MOOD NEUROLOGICAL BOWEL NUTRITION STATUS      Continent Diet  AMBULATORY STATUS COMMUNICATION OF NEEDS Skin   Supervision Verbally Normal                       Personal Care Assistance Level of Assistance  Bathing, Feeding, Dressing Bathing Assistance: Limited assistance Feeding assistance: Independent Dressing Assistance: Limited assistance     Functional Limitations Info  Sight, Hearing, Speech Sight Info: Adequate Hearing Info: Adequate Speech Info: Adequate (Recent decannulation on 6/7, has dressing over stoma site)    SPECIAL CARE FACTORS FREQUENCY  PT (By licensed PT), OT (By licensed OT)     PT Frequency: 5x/week OT Frequency: 5x/week            Contractures Contractures Info: Not present    Additional Factors Info  Code Status, Allergies Code Status Info: Full Code Allergies Info: No Known Allergies           Current Medications (04/06/2021):  This is the current hospital active medication list Current Facility-Administered Medications  Medication Dose Route Frequency Provider Last Rate Last Admin   0.9 %  sodium chloride infusion  250 mL Intravenous PRN 06/07/2021 A, MD       acetaminophen (TYLENOL) tablet 650 mg  650 mg Oral Q4H PRN Smith, Rondell A, MD       acetylcysteine (MUCOMYST) 20 % nebulizer / oral solution 4 mL  4 mL Nebulization Q12H PRN Madelyn Flavors A, MD       aspirin chewable tablet 81 mg  81 mg  Oral Daily Madelyn Flavors A, MD   81 mg at 04/06/21 0848   bisacodyl (DULCOLAX) suppository 10 mg  10 mg Rectal Daily PRN Madelyn Flavors A, MD       budesonide (PULMICORT) nebulizer solution 0.5 mg  0.5 mg Nebulization BID Katrinka Blazing, Rondell A, MD   0.5 mg at 04/06/21 0740   clonazePAM (KLONOPIN) disintegrating tablet 0.25 mg  0.25 mg Oral BID Madelyn Flavors A, MD   0.25 mg at 04/06/21 0848   COVID-19 mRNA Vac-TriS (Pfizer) injection 0.3 mL  0.3 mL Intramuscular Once Drema Dallas, MD        digoxin Margit Banda) tablet 0.25 mg  0.25 mg Oral Daily Katrinka Blazing, Rondell A, MD   0.25 mg at 04/06/21 0847   divalproex (DEPAKOTE) DR tablet 750 mg  750 mg Oral BID Eliseo Gum B, MD   750 mg at 04/06/21 0848   docusate sodium (COLACE) capsule 100 mg  100 mg Oral BID Madelyn Flavors A, MD   100 mg at 04/06/21 0848   FLUoxetine (PROZAC) capsule 20 mg  20 mg Oral Daily Katrinka Blazing, Rondell A, MD   20 mg at 04/06/21 0848   furosemide (LASIX) tablet 40 mg  40 mg Oral BID Coralie Keens, MD   40 mg at 04/06/21 0848   gabapentin (NEURONTIN) capsule 200 mg  200 mg Oral BID Madelyn Flavors A, MD   200 mg at 04/06/21 0847   guaiFENesin (MUCINEX) 12 hr tablet 600 mg  600 mg Oral Q12H Smith, Rondell A, MD   600 mg at 04/06/21 0848   heparin injection 5,000 Units  5,000 Units Subcutaneous Q8H Smith, Rondell A, MD   5,000 Units at 04/06/21 0548   insulin aspart (novoLOG) injection 0-15 Units  0-15 Units Subcutaneous TID WC Arrien, York Ram, MD   8 Units at 04/06/21 0548   insulin aspart (novoLOG) injection 5 Units  5 Units Subcutaneous TID WC Edsel Petrin, DO   5 Units at 04/06/21 0849   insulin glargine (LANTUS) injection 10 Units  10 Units Subcutaneous Daily Coralie Keens, MD   10 Units at 04/05/21 0350   ipratropium-albuterol (DUONEB) 0.5-2.5 (3) MG/3ML nebulizer solution 3 mL  3 mL Nebulization Q6H PRN Edsel Petrin, DO   3 mL at 04/02/21 0938   magnesium oxide (MAG-OX) tablet 400 mg  400 mg Oral BID Madelyn Flavors A, MD   400 mg at 04/06/21 0848   metoprolol succinate (TOPROL-XL) 24 hr tablet 25 mg  25 mg Oral Daily Smith, Rondell A, MD   25 mg at 04/06/21 0848   ondansetron (ZOFRAN) injection 4 mg  4 mg Intravenous Q6H PRN Madelyn Flavors A, MD       pantoprazole (PROTONIX) EC tablet 40 mg  40 mg Oral Daily Katrinka Blazing, Rondell A, MD   40 mg at 04/06/21 0847   QUEtiapine (SEROQUEL) tablet 200 mg  200 mg Oral QHS Smith, Rondell A, MD   200 mg at 04/05/21 2031   QUEtiapine (SEROQUEL) tablet 50  mg  50 mg Oral q AM Arrien, York Ram, MD   50 mg at 04/04/21 1011   sodium chloride flush (NS) 0.9 % injection 3 mL  3 mL Intravenous Q12H Smith, Rondell A, MD   3 mL at 04/05/21 2031   sodium chloride flush (NS) 0.9 % injection 3 mL  3 mL Intravenous PRN Clydie Braun, MD         Discharge Medications: Please see discharge summary for a list of  discharge medications.  Relevant Imaging Results:  Relevant Lab Results:   Additional Information SSN: [id number] - has received Covid vaccines  Inis Sizer, LCSW

## 2021-04-06 NOTE — Progress Notes (Addendum)
2pm: CSW spoke with Detective Hoontrakul of the [facility] who states he will be coming to the hospital on 04/07/21 to interview the patient.  CSW notified Water quality scientist of information.  9am: CSW spoke with patient's mother Tommy Mathews to discuss discharge planning. Tommy Mathews states that the patient cannot live with her or his two sisters. Tommy Mathews states she is the patient's decision maker but that she is not his legal guardian. Tommy Mathews is agreeable for placement in a group home if a bed offer can be obtained.  CSW and RN CM spoke with patient at bedside to discuss discharge plan. Patient able to confirm he was residing at [facility] prior to hospitalization. Patient unable to state the reason why he is currently at the hospital. Patient confirms his PCP is Dr. Donette Larry, agreeing that his PCP manages his medications for his schizophrenia. Patient currently denies any AH or VH. Patient stated understanding that he cannot return to [facility] and barriers to further SNF placement. Patient is currently on 7L of oxygen. CSW informed patient he may be discharged to a shelter, and he stated understanding. Patient states he is able to ambulate well with his walker and can care for himself. At times, patient's speech is difficult to understand but he covers his former trach site and is able to communicate effectively. CSW informed patient that he was not able to reside with his mother or two sisters.  CSW faxed patient's clinical information to [facility] at [facility] for review.  Tommy Mathews, MSW, LCSW Transitions of Care  Clinical Social Worker II [phone]

## 2021-04-07 ENCOUNTER — Inpatient Hospital Stay (HOSPITAL_COMMUNITY): Payer: Medicaid Other

## 2021-04-07 DIAGNOSIS — J9621 Acute and chronic respiratory failure with hypoxia: Secondary | ICD-10-CM | POA: Diagnosis not present

## 2021-04-07 DIAGNOSIS — I5033 Acute on chronic diastolic (congestive) heart failure: Secondary | ICD-10-CM | POA: Diagnosis not present

## 2021-04-07 DIAGNOSIS — R0602 Shortness of breath: Secondary | ICD-10-CM | POA: Diagnosis not present

## 2021-04-07 DIAGNOSIS — N179 Acute kidney failure, unspecified: Secondary | ICD-10-CM | POA: Diagnosis not present

## 2021-04-07 LAB — GLUCOSE, CAPILLARY
Glucose-Capillary: 141 mg/dL — ABNORMAL HIGH (ref 70–99)
Glucose-Capillary: 178 mg/dL — ABNORMAL HIGH (ref 70–99)
Glucose-Capillary: 129 mg/dL — ABNORMAL HIGH (ref 70–99)
Glucose-Capillary: 155 mg/dL — ABNORMAL HIGH (ref 70–99)

## 2021-04-07 LAB — COMPREHENSIVE METABOLIC PANEL
ALT: 12 U/L (ref 0–44)
AST: 17 U/L (ref 15–41)
Albumin: 2.3 g/dL — ABNORMAL LOW (ref 3.5–5.0)
Alkaline Phosphatase: 44 U/L (ref 38–126)
Anion gap: 8 (ref 5–15)
BUN: 15 mg/dL (ref 6–20)
CO2: 32 mmol/L (ref 22–32)
Calcium: 8.8 mg/dL — ABNORMAL LOW (ref 8.9–10.3)
Chloride: 97 mmol/L — ABNORMAL LOW (ref 98–111)
Creatinine, Ser: 0.99 mg/dL (ref 0.61–1.24)
GFR, Estimated: >60 mL/min (ref >60)
Glucose, Bld: 125 mg/dL — ABNORMAL HIGH (ref 70–99)
Potassium: 4.5 mmol/L (ref 3.5–5.1)
Sodium: 137 mmol/L (ref 135–145)
Total Bilirubin: 0.6 mg/dL (ref 0.3–1.2)
Total Protein: 6 g/dL — ABNORMAL LOW (ref 6.5–8.1)

## 2021-04-07 LAB — CBC WITH DIFFERENTIAL/PLATELET
Abs Immature Granulocytes: 0.03 10*3/uL (ref 0.00–0.07)
Basophils Absolute: 0 10*3/uL (ref 0.0–0.1)
Basophils Relative: 1 %
Eosinophils Absolute: 0.2 10*3/uL (ref 0.0–0.5)
Eosinophils Relative: 3 %
HCT: 60.2 % — ABNORMAL HIGH (ref 39.0–52.0)
Hemoglobin: 17.8 g/dL — ABNORMAL HIGH (ref 13.0–17.0)
Immature Granulocytes: 1 %
Lymphocytes Relative: 31 %
Lymphs Abs: 1.9 10*3/uL (ref 0.7–4.0)
MCH: 25.3 pg — ABNORMAL LOW (ref 26.0–34.0)
MCHC: 29.6 g/dL — ABNORMAL LOW (ref 30.0–36.0)
MCV: 85.6 fL (ref 80.0–100.0)
Monocytes Absolute: 1 10*3/uL (ref 0.1–1.0)
Monocytes Relative: 17 %
Neutro Abs: 2.9 10*3/uL (ref 1.7–7.7)
Neutrophils Relative %: 47 %
Platelets: 151 10*3/uL (ref 150–400)
RBC: 7.03 MIL/uL — ABNORMAL HIGH (ref 4.22–5.81)
RDW: 20.3 % — ABNORMAL HIGH (ref 11.5–15.5)
WBC: 6 10*3/uL (ref 4.0–10.5)
nRBC: 0 % (ref 0.0–0.2)

## 2021-04-07 LAB — MAGNESIUM: Magnesium: 1.8 mg/dL (ref 1.7–2.4)

## 2021-04-07 LAB — PHOSPHORUS: Phosphorus: 3.8 mg/dL (ref 2.5–4.6)
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
## 2021-04-07 NOTE — Plan of Care (Signed)
  Problem: Education: Goal: Ability to demonstrate management of disease process will improve Outcome: Progressing Goal: Ability to verbalize understanding of medication therapies will improve Outcome: Progressing Goal: Individualized Educational Video(s) Outcome: Progressing   Problem: Activity: Goal: Capacity to carry out activities will improve Outcome: Progressing   Problem: Cardiac: Goal: Ability to achieve and maintain adequate cardiopulmonary perfusion will improve Outcome: Progressing   Problem: Education: Goal: Knowledge of General Education information will improve Description: Including pain rating scale, medication(s)/side effects and non-pharmacologic comfort measures Outcome: Progressing   Problem: Health Behavior/Discharge Planning: Goal: Ability to manage health-related needs will improve Outcome: Progressing   Problem: Clinical Measurements: Goal: Ability to maintain clinical measurements within normal limits will improve Outcome: Progressing Goal: Will remain free from infection Outcome: Progressing Goal: Diagnostic test results will improve Outcome: Progressing Goal: Respiratory complications will improve Outcome: Progressing Goal: Cardiovascular complication will be avoided Outcome: Progressing   Problem: Activity: Goal: Risk for activity intolerance will decrease Outcome: Progressing   Problem: Coping: Goal: Level of anxiety will decrease Outcome: Progressing   Problem: Elimination: Goal: Will not experience complications related to bowel motility Outcome: Progressing Goal: Will not experience complications related to urinary retention Outcome: Progressing   Problem: Pain Managment: Goal: General experience of comfort will improve Outcome: Progressing   Problem: Safety: Goal: Ability to remain free from injury will improve Outcome: Progressing   Problem: Skin Integrity: Goal: Risk for impaired skin integrity will decrease Outcome:  Progressing   

## 2021-04-08 DIAGNOSIS — I5033 Acute on chronic diastolic (congestive) heart failure: Secondary | ICD-10-CM | POA: Diagnosis not present

## 2021-04-08 DIAGNOSIS — N179 Acute kidney failure, unspecified: Secondary | ICD-10-CM | POA: Diagnosis not present

## 2021-04-08 DIAGNOSIS — J9621 Acute and chronic respiratory failure with hypoxia: Secondary | ICD-10-CM | POA: Diagnosis not present

## 2021-04-08 DIAGNOSIS — R0602 Shortness of breath: Secondary | ICD-10-CM | POA: Diagnosis not present

## 2021-04-08 LAB — CBC WITH DIFFERENTIAL/PLATELET
Abs Immature Granulocytes: 0.06 10*3/uL (ref 0.00–0.07)
Basophils Absolute: 0 10*3/uL (ref 0.0–0.1)
Basophils Relative: 1 %
Eosinophils Absolute: 0.2 10*3/uL (ref 0.0–0.5)
Eosinophils Relative: 2 %
HCT: 57.8 % — ABNORMAL HIGH (ref 39.0–52.0)
Hemoglobin: 17.5 g/dL — ABNORMAL HIGH (ref 13.0–17.0)
Immature Granulocytes: 1 %
Lymphocytes Relative: 28 %
Lymphs Abs: 2 10*3/uL (ref 0.7–4.0)
MCH: 25.8 pg — ABNORMAL LOW (ref 26.0–34.0)
MCHC: 30.3 g/dL (ref 30.0–36.0)
MCV: 85.4 fL (ref 80.0–100.0)
Monocytes Absolute: 0.9 10*3/uL (ref 0.1–1.0)
Monocytes Relative: 13 %
Neutro Abs: 4 10*3/uL (ref 1.7–7.7)
Neutrophils Relative %: 55 %
Platelets: 166 10*3/uL (ref 150–400)
RBC: 6.77 MIL/uL — ABNORMAL HIGH (ref 4.22–5.81)
RDW: 20.3 % — ABNORMAL HIGH (ref 11.5–15.5)
WBC: 7.1 10*3/uL (ref 4.0–10.5)
nRBC: 0 % (ref 0.0–0.2)

## 2021-04-08 LAB — GLUCOSE, CAPILLARY
Glucose-Capillary: 182 mg/dL — ABNORMAL HIGH (ref 70–99)
Glucose-Capillary: 149 mg/dL — ABNORMAL HIGH (ref 70–99)
Glucose-Capillary: 138 mg/dL — ABNORMAL HIGH (ref 70–99)
Glucose-Capillary: 236 mg/dL — ABNORMAL HIGH (ref 70–99)

## 2021-04-08 LAB — COMPREHENSIVE METABOLIC PANEL
ALT: 11 U/L (ref 0–44)
AST: 19 U/L (ref 15–41)
Albumin: 2.5 g/dL — ABNORMAL LOW (ref 3.5–5.0)
Alkaline Phosphatase: 46 U/L (ref 38–126)
Anion gap: 5 (ref 5–15)
BUN: 17 mg/dL (ref 6–20)
CO2: 34 mmol/L — ABNORMAL HIGH (ref 22–32)
Calcium: 8.8 mg/dL — ABNORMAL LOW (ref 8.9–10.3)
Chloride: 94 mmol/L — ABNORMAL LOW (ref 98–111)
Creatinine, Ser: 1.14 mg/dL (ref 0.61–1.24)
GFR, Estimated: >60 mL/min (ref >60)
Glucose, Bld: 149 mg/dL — ABNORMAL HIGH (ref 70–99)
Potassium: 4.7 mmol/L (ref 3.5–5.1)
Sodium: 133 mmol/L — ABNORMAL LOW (ref 135–145)
Total Bilirubin: 0.5 mg/dL (ref 0.3–1.2)
Total Protein: 5.9 g/dL — ABNORMAL LOW (ref 6.5–8.1)

## 2021-04-08 LAB — MAGNESIUM: Magnesium: 1.7 mg/dL (ref 1.7–2.4)

## 2021-04-08 LAB — PHOSPHORUS: Phosphorus: 3.7 mg/dL (ref 2.5–4.6)

## 2021-04-08 MED ORDER — FUROSEMIDE 10 MG/ML IJ SOLN
60.0000 mg | Freq: Two times a day (BID) | INTRAMUSCULAR | Status: AC
  Administered 2021-04-08 – 2021-04-09 (×2): 60 mg via INTRAVENOUS
  Filled 2021-04-08 (×2): qty 6

## 2021-04-08 MED ORDER — ALBUMIN HUMAN 25 % IV SOLN
50.0000 g | Freq: Once | INTRAVENOUS | Status: AC
  Administered 2021-04-08: 50 g via INTRAVENOUS
  Filled 2021-04-08 (×2): qty 200
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
## 2021-04-09 DIAGNOSIS — R0602 Shortness of breath: Secondary | ICD-10-CM | POA: Diagnosis not present

## 2021-04-09 DIAGNOSIS — J9621 Acute and chronic respiratory failure with hypoxia: Secondary | ICD-10-CM | POA: Diagnosis not present

## 2021-04-09 DIAGNOSIS — I5033 Acute on chronic diastolic (congestive) heart failure: Secondary | ICD-10-CM | POA: Diagnosis not present

## 2021-04-09 DIAGNOSIS — N179 Acute kidney failure, unspecified: Secondary | ICD-10-CM | POA: Diagnosis not present

## 2021-04-09 LAB — COMPREHENSIVE METABOLIC PANEL
ALT: 9 U/L (ref 0–44)
AST: 15 U/L (ref 15–41)
Albumin: 3.1 g/dL — ABNORMAL LOW (ref 3.5–5.0)
Alkaline Phosphatase: 41 U/L (ref 38–126)
Anion gap: 9 (ref 5–15)
BUN: 16 mg/dL (ref 6–20)
CO2: 32 mmol/L (ref 22–32)
Calcium: 8.8 mg/dL — ABNORMAL LOW (ref 8.9–10.3)
Chloride: 97 mmol/L — ABNORMAL LOW (ref 98–111)
Creatinine, Ser: 0.93 mg/dL (ref 0.61–1.24)
GFR, Estimated: >60 mL/min (ref >60)
Glucose, Bld: 129 mg/dL — ABNORMAL HIGH (ref 70–99)
Potassium: 3.9 mmol/L (ref 3.5–5.1)
Sodium: 138 mmol/L (ref 135–145)
Total Bilirubin: 0.4 mg/dL (ref 0.3–1.2)
Total Protein: 6.3 g/dL — ABNORMAL LOW (ref 6.5–8.1)

## 2021-04-09 LAB — CBC WITH DIFFERENTIAL/PLATELET
Abs Immature Granulocytes: 0.06 10*3/uL (ref 0.00–0.07)
Basophils Absolute: 0.1 10*3/uL (ref 0.0–0.1)
Basophils Relative: 1 %
Eosinophils Absolute: 0.2 10*3/uL (ref 0.0–0.5)
Eosinophils Relative: 2 %
HCT: 57.5 % — ABNORMAL HIGH (ref 39.0–52.0)
Hemoglobin: 16.8 g/dL (ref 13.0–17.0)
Immature Granulocytes: 1 %
Lymphocytes Relative: 27 %
Lymphs Abs: 1.8 10*3/uL (ref 0.7–4.0)
MCH: 25.1 pg — ABNORMAL LOW (ref 26.0–34.0)
MCHC: 29.2 g/dL — ABNORMAL LOW (ref 30.0–36.0)
MCV: 85.8 fL (ref 80.0–100.0)
Monocytes Absolute: 0.9 10*3/uL (ref 0.1–1.0)
Monocytes Relative: 13 %
Neutro Abs: 3.7 10*3/uL (ref 1.7–7.7)
Neutrophils Relative %: 56 %
Platelets: 165 10*3/uL (ref 150–400)
RBC: 6.7 MIL/uL — ABNORMAL HIGH (ref 4.22–5.81)
RDW: 20.3 % — ABNORMAL HIGH (ref 11.5–15.5)
WBC: 6.7 10*3/uL (ref 4.0–10.5)
nRBC: 0 % (ref 0.0–0.2)

## 2021-04-09 LAB — GLUCOSE, CAPILLARY
Glucose-Capillary: 135 mg/dL — ABNORMAL HIGH (ref 70–99)
Glucose-Capillary: 177 mg/dL — ABNORMAL HIGH (ref 70–99)
Glucose-Capillary: 158 mg/dL — ABNORMAL HIGH (ref 70–99)
Glucose-Capillary: 160 mg/dL — ABNORMAL HIGH (ref 70–99)

## 2021-04-09 LAB — PHOSPHORUS: Phosphorus: 3.9 mg/dL (ref 2.5–4.6)

## 2021-04-09 LAB — MAGNESIUM: Magnesium: 1.8 mg/dL (ref 1.7–2.4)

## 2021-04-09 MED ORDER — FUROSEMIDE 10 MG/ML IJ SOLN
40.0000 mg | Freq: Every day | INTRAMUSCULAR | Status: DC
  Administered 2021-04-10: 40 mg via INTRAVENOUS
  Filled 2021-04-09: qty 4
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
## 2021-04-09 NOTE — Progress Notes (Signed)
Came to room for breathing tx, upon entering room noted p.ox alarm beeping.  Noted sat 76%, pt was asleep and no distress noted, noted pt w/ [place] off/out of nose.  Placed o2 back on, Woke pt up, instructed pt to deep breath and cough.  Sat improved to 90-91% on 10 lpm HFNC.  RN notified, NT in room and aware/notified.  Door left open so staff can hear p.ox

## 2021-04-09 NOTE — Plan of Care (Signed)
°  Problem: Education: °Goal: Ability to demonstrate management of disease process will improve °Outcome: Progressing °Goal: Ability to verbalize understanding of medication therapies will improve °Outcome: Progressing °Goal: Individualized Educational Video(s) °Outcome: Progressing °  °

## 2021-04-10 ENCOUNTER — Inpatient Hospital Stay (HOSPITAL_COMMUNITY): Payer: Medicaid Other

## 2021-04-10 DIAGNOSIS — J9621 Acute and chronic respiratory failure with hypoxia: Secondary | ICD-10-CM | POA: Diagnosis not present

## 2021-04-10 DIAGNOSIS — N179 Acute kidney failure, unspecified: Secondary | ICD-10-CM | POA: Diagnosis not present

## 2021-04-10 DIAGNOSIS — I5033 Acute on chronic diastolic (congestive) heart failure: Secondary | ICD-10-CM | POA: Diagnosis not present

## 2021-04-10 DIAGNOSIS — R0602 Shortness of breath: Secondary | ICD-10-CM | POA: Diagnosis not present

## 2021-04-10 LAB — CBC WITH DIFFERENTIAL/PLATELET
Abs Immature Granulocytes: 0.04 10*3/uL (ref 0.00–0.07)
Basophils Absolute: 0.1 10*3/uL (ref 0.0–0.1)
Basophils Relative: 1 %
Eosinophils Absolute: 0.2 10*3/uL (ref 0.0–0.5)
Eosinophils Relative: 2 %
HCT: 60 % — ABNORMAL HIGH (ref 39.0–52.0)
Hemoglobin: 17.6 g/dL — ABNORMAL HIGH (ref 13.0–17.0)
Immature Granulocytes: 1 %
Lymphocytes Relative: 32 %
Lymphs Abs: 2.4 10*3/uL (ref 0.7–4.0)
MCH: 25.3 pg — ABNORMAL LOW (ref 26.0–34.0)
MCHC: 29.3 g/dL — ABNORMAL LOW (ref 30.0–36.0)
MCV: 86.1 fL (ref 80.0–100.0)
Monocytes Absolute: 0.9 10*3/uL (ref 0.1–1.0)
Monocytes Relative: 12 %
Neutro Abs: 4.1 10*3/uL (ref 1.7–7.7)
Neutrophils Relative %: 52 %
Platelets: 177 10*3/uL (ref 150–400)
RBC: 6.97 MIL/uL — ABNORMAL HIGH (ref 4.22–5.81)
RDW: 20.7 % — ABNORMAL HIGH (ref 11.5–15.5)
WBC: 7.6 10*3/uL (ref 4.0–10.5)
nRBC: 0 % (ref 0.0–0.2)

## 2021-04-10 LAB — COMPREHENSIVE METABOLIC PANEL
ALT: 10 U/L (ref 0–44)
AST: 15 U/L (ref 15–41)
Albumin: 2.8 g/dL — ABNORMAL LOW (ref 3.5–5.0)
Alkaline Phosphatase: 42 U/L (ref 38–126)
Anion gap: 8 (ref 5–15)
BUN: 19 mg/dL (ref 6–20)
CO2: 33 mmol/L — ABNORMAL HIGH (ref 22–32)
Calcium: 8.7 mg/dL — ABNORMAL LOW (ref 8.9–10.3)
Chloride: 94 mmol/L — ABNORMAL LOW (ref 98–111)
Creatinine, Ser: 1.02 mg/dL (ref 0.61–1.24)
GFR, Estimated: >60 mL/min (ref >60)
Glucose, Bld: 89 mg/dL (ref 70–99)
Potassium: 3.8 mmol/L (ref 3.5–5.1)
Sodium: 135 mmol/L (ref 135–145)
Total Bilirubin: 0.6 mg/dL (ref 0.3–1.2)
Total Protein: 6.4 g/dL — ABNORMAL LOW (ref 6.5–8.1)

## 2021-04-10 LAB — PHOSPHORUS: Phosphorus: 4.1 mg/dL (ref 2.5–4.6)

## 2021-04-10 LAB — GLUCOSE, CAPILLARY
Glucose-Capillary: 108 mg/dL — ABNORMAL HIGH (ref 70–99)
Glucose-Capillary: 198 mg/dL — ABNORMAL HIGH (ref 70–99)
Glucose-Capillary: 137 mg/dL — ABNORMAL HIGH (ref 70–99)
Glucose-Capillary: 261 mg/dL — ABNORMAL HIGH (ref 70–99)

## 2021-04-10 LAB — MAGNESIUM: Magnesium: 1.8 mg/dL (ref 1.7–2.4)

## 2021-04-10 MED ORDER — FUROSEMIDE 10 MG/ML IJ SOLN
40.0000 mg | Freq: Three times a day (TID) | INTRAMUSCULAR | Status: DC
  Administered 2021-04-10 – 2021-04-11 (×3): 40 mg via INTRAVENOUS
  Filled 2021-04-10 (×3): qty 4

## 2021-04-10 MED ORDER — IPRATROPIUM-ALBUTEROL 0.5-2.5 (3) MG/3ML IN SOLN
3.0000 mL | Freq: Two times a day (BID) | RESPIRATORY_TRACT | Status: DC
  Administered 2021-04-10 – 2021-04-11 (×2): 3 mL via RESPIRATORY_TRACT
  Filled 2021-04-10 (×2): qty 3

## 2021-04-10 MED ORDER — IPRATROPIUM-ALBUTEROL 0.5-2.5 (3) MG/3ML IN SOLN
3.0000 mL | Freq: Four times a day (QID) | RESPIRATORY_TRACT | Status: DC
  Filled 2021-04-10: qty 3

## 2021-04-10 MED ORDER — IOHEXOL 350 MG/ML SOLN
75.0000 mL | Freq: Once | INTRAVENOUS | Status: AC | PRN
  Administered 2021-04-10: 75 mL via INTRAVENOUS

## 2021-04-10 NOTE — Progress Notes (Signed)
Pt refusing telemetry. MD made aware.
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
## 2021-04-10 NOTE — Plan of Care (Signed)
  Problem: Education: Goal: Ability to demonstrate management of disease process will improve Outcome: Progressing Goal: Ability to verbalize understanding of medication therapies will improve Outcome: Progressing Goal: Individualized Educational Video(s) Outcome: Progressing   Problem: Activity: Goal: Capacity to carry out activities will improve Outcome: Progressing   Problem: Cardiac: Goal: Ability to achieve and maintain adequate cardiopulmonary perfusion will improve Outcome: Progressing   Problem: Education: Goal: Knowledge of General Education information will improve Description: Including pain rating scale, medication(s)/side effects and non-pharmacologic comfort measures Outcome: Progressing   Problem: Health Behavior/Discharge Planning: Goal: Ability to manage health-related needs will improve Outcome: Progressing   Problem: Clinical Measurements: Goal: Ability to maintain clinical measurements within normal limits will improve Outcome: Progressing Goal: Will remain free from infection Outcome: Progressing Goal: Diagnostic test results will improve Outcome: Progressing Goal: Respiratory complications will improve Outcome: Progressing Goal: Cardiovascular complication will be avoided Outcome: Progressing   Problem: Activity: Goal: Risk for activity intolerance will decrease Outcome: Progressing   Problem: Coping: Goal: Level of anxiety will decrease Outcome: Progressing   Problem: Elimination: Goal: Will not experience complications related to bowel motility Outcome: Progressing Goal: Will not experience complications related to urinary retention Outcome: Progressing   Problem: Pain Managment: Goal: General experience of comfort will improve Outcome: Progressing   Problem: Safety: Goal: Ability to remain free from injury will improve Outcome: Progressing   Problem: Skin Integrity: Goal: Risk for impaired skin integrity will decrease Outcome:  Progressing   

## 2021-04-10 NOTE — Plan of Care (Signed)
  Problem: Activity: Goal: Risk for activity intolerance will decrease Outcome: Progressing   Problem: Coping: Goal: Level of anxiety will decrease Outcome: Progressing   Problem: Safety: Goal: Ability to remain free from injury will improve Outcome: Progressing   

## 2021-04-10 NOTE — Progress Notes (Signed)
Physical Therapy Treatment Patient Details Name: Tommy Mathews MRN: [id number] DOB: 09-22-1961 Today's Date: 04/10/2021    History of Present Illness Pt adm 6/20 with respiratory failure with hypoxia due to diastolic heart failure and COPD exacerbation. PMH - TBI with trach (2019), HTN, afib, DM, schizophrenia, bipolar.    PT Comments    Pt tolerates stair negotiation this session, without physical assistance and with bil handrail use. Pt tolerates ambulation with RW for increased distances compared to last session with cues for upright posture and proximity to AD. Pt continues to demonstrate deficits in activity tolerance and balance and will benefit from continued acute PT to improve independence in mobility.    Follow Up Recommendations  Other (comment) (return to long term care facility)     Equipment Recommendations  None recommended by PT    Recommendations for Other Services       Precautions / Restrictions Precautions Precautions: Fall Restrictions Weight Bearing Restrictions: No    Mobility  Bed Mobility Overal bed mobility: Modified Independent                  Transfers Overall transfer level: Needs assistance Equipment used: Rolling walker (2 wheeled) Transfers: Sit to/from Stand Sit to Stand: Min guard;Supervision         General transfer comment: supervision for sit to stand with cues for hand placement. min G for stand to sit with cues to reach back for armrests and for slow/controlled descent to seat surface.  Ambulation/Gait Ambulation/Gait assistance: Min guard Gait Distance (Feet): 150 Feet Assistive device: Rolling walker (2 wheeled) Gait Pattern/deviations: Decreased stride length;Step-through pattern;Wide base of support;Trunk flexed;Decreased step length - right Gait velocity: WFL Gait velocity interpretation: >2.62 ft/sec, indicative of community ambulatory General Gait Details: slow mostly steady step-through gait with cues for  upright posture and proximity to RW.   Stairs Stairs: Yes Stairs assistance: Min guard Stair Management: Two rails;Step to pattern Number of Stairs: 10 General stair comments: Pt performs step-ups with bil handrail use x 10 reps   Wheelchair Mobility    Modified Rankin (Stroke Patients Only)       Balance Overall balance assessment: Needs assistance Sitting-balance support: Feet supported;Single extremity supported Sitting balance-Leahy Scale: Poor Sitting balance - Comments: pt reliant on at least single UE support when donning shoes in sitting.   Standing balance support: Bilateral upper extremity supported;During functional activity Standing balance-Leahy Scale: Poor Standing balance comment: pt reliant on bil UE support                            Cognition Arousal/Alertness: Awake/alert Behavior During Therapy: WFL for tasks assessed/performed Overall Cognitive Status: No family/caregiver present to determine baseline cognitive functioning                                 General Comments: prior TBI. Follows 1-step commands consistently. Increased time for processing.      Exercises      General Comments General comments (skin integrity, edema, etc.): Pt on RA satting at 85% on arrival. Pt on 10 L [place] during mobility satting around 95%. Pt placed on 9 L HFNC at end of session.      Pertinent Vitals/Pain Pain Assessment: No/denies pain    Home Living                      Prior  Function            PT Goals (current goals can now be found in the care plan section) Acute Rehab PT Goals Patient Stated Goal: get better Progress towards PT goals: Progressing toward goals    Frequency    Min 2X/week      PT Plan Current plan remains appropriate    Co-evaluation              AM-PAC PT "6 Clicks" Mobility   Outcome Measure  Help needed turning from your back to your side while in a flat bed without using  bedrails?: None Help needed moving from lying on your back to sitting on the side of a flat bed without using bedrails?: None Help needed moving to and from a bed to a chair (including a wheelchair)?: A Little Help needed standing up from a chair using your arms (e.g., wheelchair or bedside chair)?: A Little Help needed to walk in hospital room?: A Little Help needed climbing 3-5 steps with a railing? : A Little 6 Click Score: 20    End of Session Equipment Utilized During Treatment: Oxygen;Gait belt Activity Tolerance: Patient tolerated treatment well Patient left: in chair;with call bell/phone within reach;with bed alarm set Nurse Communication: Mobility status PT Visit Diagnosis: Unsteadiness on feet (R26.81);Other abnormalities of gait and mobility (R26.89)     Time: 5784-6962 PT Time Calculation (min) (ACUTE ONLY): 30 min  Charges:  $Gait Training: 23-37 mins                     Acute Rehab  Pager: [phone]    Waldemar Dickens, SPT  04/10/2021, 1:32 PM

## 2021-04-11 ENCOUNTER — Inpatient Hospital Stay (HOSPITAL_COMMUNITY): Payer: Medicaid Other

## 2021-04-11 DIAGNOSIS — R0602 Shortness of breath: Secondary | ICD-10-CM | POA: Diagnosis not present

## 2021-04-11 DIAGNOSIS — I82401 Acute embolism and thrombosis of unspecified deep veins of right lower extremity: Secondary | ICD-10-CM

## 2021-04-11 DIAGNOSIS — I82409 Acute embolism and thrombosis of unspecified deep veins of unspecified lower extremity: Secondary | ICD-10-CM | POA: Diagnosis present

## 2021-04-11 DIAGNOSIS — I5033 Acute on chronic diastolic (congestive) heart failure: Secondary | ICD-10-CM | POA: Diagnosis not present

## 2021-04-11 DIAGNOSIS — J9621 Acute and chronic respiratory failure with hypoxia: Secondary | ICD-10-CM | POA: Diagnosis not present

## 2021-04-11 DIAGNOSIS — N179 Acute kidney failure, unspecified: Secondary | ICD-10-CM | POA: Diagnosis not present

## 2021-04-11 DIAGNOSIS — F201 Disorganized schizophrenia: Secondary | ICD-10-CM

## 2021-04-11 DIAGNOSIS — I5021 Acute systolic (congestive) heart failure: Secondary | ICD-10-CM | POA: Diagnosis not present

## 2021-04-11 DIAGNOSIS — I2699 Other pulmonary embolism without acute cor pulmonale: Secondary | ICD-10-CM | POA: Diagnosis not present

## 2021-04-11 LAB — CBC WITH DIFFERENTIAL/PLATELET
Abs Immature Granulocytes: 0.03 10*3/uL (ref 0.00–0.07)
Basophils Absolute: 0.1 10*3/uL (ref 0.0–0.1)
Basophils Relative: 1 %
Eosinophils Absolute: 0.1 10*3/uL (ref 0.0–0.5)
Eosinophils Relative: 2 %
HCT: 61.5 % — ABNORMAL HIGH (ref 39.0–52.0)
Hemoglobin: 18.2 g/dL — ABNORMAL HIGH (ref 13.0–17.0)
Immature Granulocytes: 0 %
Lymphocytes Relative: 30 %
Lymphs Abs: 2.1 10*3/uL (ref 0.7–4.0)
MCH: 25.2 pg — ABNORMAL LOW (ref 26.0–34.0)
MCHC: 29.6 g/dL — ABNORMAL LOW (ref 30.0–36.0)
MCV: 85.1 fL (ref 80.0–100.0)
Monocytes Absolute: 1 10*3/uL (ref 0.1–1.0)
Monocytes Relative: 13 %
Neutro Abs: 3.9 10*3/uL (ref 1.7–7.7)
Neutrophils Relative %: 54 %
Platelets: 192 10*3/uL (ref 150–400)
RBC: 7.23 MIL/uL — ABNORMAL HIGH (ref 4.22–5.81)
RDW: 20.7 % — ABNORMAL HIGH (ref 11.5–15.5)
WBC: 7.2 10*3/uL (ref 4.0–10.5)
nRBC: 0 % (ref 0.0–0.2)

## 2021-04-11 LAB — ECHOCARDIOGRAM COMPLETE
Area-P 1/2: 2.16 cm2
Height: 66 in
S' Lateral: 3 cm
Weight: 3412.8 oz

## 2021-04-11 LAB — COMPREHENSIVE METABOLIC PANEL
ALT: 12 U/L (ref 0–44)
AST: 19 U/L (ref 15–41)
Albumin: 2.9 g/dL — ABNORMAL LOW (ref 3.5–5.0)
Alkaline Phosphatase: 46 U/L (ref 38–126)
Anion gap: 10 (ref 5–15)
BUN: 20 mg/dL (ref 6–20)
CO2: 33 mmol/L — ABNORMAL HIGH (ref 22–32)
Calcium: 8.9 mg/dL (ref 8.9–10.3)
Chloride: 95 mmol/L — ABNORMAL LOW (ref 98–111)
Creatinine, Ser: 1.2 mg/dL (ref 0.61–1.24)
GFR, Estimated: >60 mL/min (ref >60)
Glucose, Bld: 127 mg/dL — ABNORMAL HIGH (ref 70–99)
Potassium: 4 mmol/L (ref 3.5–5.1)
Sodium: 138 mmol/L (ref 135–145)
Total Bilirubin: 0.8 mg/dL (ref 0.3–1.2)
Total Protein: 7 g/dL (ref 6.5–8.1)

## 2021-04-11 LAB — GLUCOSE, CAPILLARY
Glucose-Capillary: 164 mg/dL — ABNORMAL HIGH (ref 70–99)
Glucose-Capillary: 133 mg/dL — ABNORMAL HIGH (ref 70–99)
Glucose-Capillary: 218 mg/dL — ABNORMAL HIGH (ref 70–99)

## 2021-04-11 LAB — HEPARIN LEVEL (UNFRACTIONATED): Heparin Unfractionated: 0.62 IU/mL (ref 0.30–0.70)

## 2021-04-11 LAB — MAGNESIUM: Magnesium: 1.8 mg/dL (ref 1.7–2.4)

## 2021-04-11 LAB — PHOSPHORUS: Phosphorus: 3.5 mg/dL (ref 2.5–4.6)

## 2021-04-11 MED ORDER — HEPARIN (PORCINE) 25000 UT/250ML-% IV SOLN
1400.0000 [IU]/h | INTRAVENOUS | Status: DC
  Administered 2021-04-11 – 2021-04-13 (×3): 1400 [IU]/h via INTRAVENOUS
  Filled 2021-04-11 (×4): qty 250

## 2021-04-11 MED ORDER — FUROSEMIDE 10 MG/ML IJ SOLN
60.0000 mg | Freq: Three times a day (TID) | INTRAMUSCULAR | Status: DC
  Administered 2021-04-11: 20 mg via INTRAVENOUS
  Administered 2021-04-11 – 2021-04-12 (×3): 60 mg via INTRAVENOUS
  Filled 2021-04-11 (×4): qty 6

## 2021-04-11 MED ORDER — HEPARIN BOLUS VIA INFUSION
2000.0000 [IU] | Freq: Once | INTRAVENOUS | Status: AC
  Administered 2021-04-11: 2000 [IU] via INTRAVENOUS
  Filled 2021-04-11: qty 2000

## 2021-04-11 NOTE — Plan of Care (Signed)
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
## 2021-04-11 NOTE — Plan of Care (Signed)
  Problem: Education: Goal: Ability to demonstrate management of disease process will improve Outcome: Progressing Goal: Ability to verbalize understanding of medication therapies will improve Outcome: Progressing Goal: Individualized Educational Video(s) Outcome: Progressing   Problem: Activity: Goal: Capacity to carry out activities will improve Outcome: Progressing   Problem: Cardiac: Goal: Ability to achieve and maintain adequate cardiopulmonary perfusion will improve Outcome: Progressing   Problem: Education: Goal: Knowledge of General Education information will improve Description: Including pain rating scale, medication(s)/side effects and non-pharmacologic comfort measures Outcome: Progressing   Problem: Health Behavior/Discharge Planning: Goal: Ability to manage health-related needs will improve Outcome: Progressing   Problem: Clinical Measurements: Goal: Ability to maintain clinical measurements within normal limits will improve Outcome: Progressing Goal: Will remain free from infection Outcome: Progressing Goal: Diagnostic test results will improve Outcome: Progressing Goal: Respiratory complications will improve Outcome: Progressing Goal: Cardiovascular complication will be avoided Outcome: Progressing   Problem: Activity: Goal: Risk for activity intolerance will decrease Outcome: Progressing   Problem: Coping: Goal: Level of anxiety will decrease Outcome: Progressing   Problem: Elimination: Goal: Will not experience complications related to bowel motility Outcome: Progressing Goal: Will not experience complications related to urinary retention Outcome: Progressing   Problem: Pain Managment: Goal: General experience of comfort will improve Outcome: Progressing   Problem: Safety: Goal: Ability to remain free from injury will improve Outcome: Progressing   Problem: Skin Integrity: Goal: Risk for impaired skin integrity will decrease Outcome:  Progressing   

## 2021-04-11 NOTE — Progress Notes (Signed)
BLE venous duplex has been completed.  Preliminary results given to [facility], RN.   Results can be found under chart review under CV PROC. 04/11/2021 5:46 PM Aneliese Beaudry RVT, RDMS

## 2021-04-11 NOTE — Progress Notes (Signed)
  Echocardiogram 2D Echocardiogram has been performed.  Tommy Mathews 04/11/2021, 10:29 AM

## 2021-04-11 NOTE — Progress Notes (Signed)
ANTICOAGULATION CONSULT NOTE - Initial Consult  Pharmacy Consult for Heparin Indication: pulmonary embolus  No Known Allergies  Patient Measurements: Height: 5\' 6"  (167.6 cm) Weight: 96.8 kg (213 lb 4.8 oz) IBW/kg (Calculated) : 63.8 Heparin Dosing Weight: 85 kg  Vital Signs: Temp: 99 F (37.2 C) (07/12 0446) Temp Source: Oral (07/12 0446) BP: 122/90 (07/12 0446) Pulse Rate: 87 (07/12 0754)  Labs: Recent Labs    04/09/21 0257 04/10/21 0418 04/11/21 0207 04/11/21 0822  HGB 16.8 17.6* 18.2*  --   HCT 57.5* 60.0* 61.5*  --   PLT 165 177 192  --   CREATININE 0.93 1.02  --  1.20    Estimated Creatinine Clearance: 72.2 mL/min (by C-G formula based on SCr of 1.2 mg/dL).   Medical History: Past Medical History:  Diagnosis Date   Bipolar 1 disorder (HCC)    COPD (chronic obstructive pulmonary disease) (HCC)    Diabetes (HCC)    GERD (gastroesophageal reflux disease)    HLD (hyperlipidemia)    HTN (hypertension)    Schizophrenia (HCC) 01/31/2021   Assessment:  60 yr old male admitted 03/20/21 and has been on SQ heparin for VTE prophylaxis. Respiratory issues 7/11, chest CTA last night revealed pulmonary embolus. Paroxysmal atrial fibrillation, not on anticoagulants prior to admission due to history of GI bleed. On Protonix 40 mg once daily. Also hx polycythemia.    Last SQ heparin dose ~7am today.     Goal of Therapy:  Heparin level 0.3-0.7 units/ml Monitor platelets by anticoagulation protocol: Yes   Plan:  Discontinue SQ heparin Heparin 2000 units IV bolus, lowered due to SQ heparin ~7am today. Heparin drip to begin at 1400 units/hr Heparin level ~6-8 hours after drip begins. Daily heparin level and CBC. Monitor for s/sx bleeding.  9/11, RPh 04/11/2021,9:30 AM
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
## 2021-04-11 NOTE — Progress Notes (Signed)
ANTICOAGULATION CONSULT NOTE - Follow Up Consult  Pharmacy Consult for Heparin Indication: pulmonary embolus  No Known Allergies  Patient Measurements: Height: 5\' 6"  (167.6 cm) Weight: 96.8 kg (213 lb 4.8 oz) IBW/kg (Calculated) : 63.8 Heparin Dosing Weight: 85kg  Vital Signs: Temp: 98.2 F (36.8 C) (07/12 1944) Temp Source: Oral (07/12 1944) BP: 105/76 (07/12 1944) Pulse Rate: 66 (07/12 1944)  Labs: Recent Labs    04/09/21 0257 04/10/21 0418 04/11/21 0207 04/11/21 0822 04/11/21 1910  HGB 16.8 17.6* 18.2*  --   --   HCT 57.5* 60.0* 61.5*  --   --   PLT 165 177 192  --   --   HEPARINUNFRC  --   --   --   --  0.62  CREATININE 0.93 1.02  --  1.20  --     Estimated Creatinine Clearance: 72.2 mL/min (by C-G formula based on SCr of 1.2 mg/dL).   Assessment: Anticoag: Heparin for PE per CTA 7/11 pm in R middle and upper lobes. No RHS. Checked CT d/t incr WOB and SOB on 7/11  - dosing wt 85 kg - Initial Hep level 0.62 in goal.  Goal of Therapy:  Heparin level 0.3-0.7 units/ml Monitor platelets by anticoagulation protocol: Yes   Plan:  Con't IV heparin at 1400 units/hr Daily HL and CBC   Hilberto Burzynski S. 9/11, PharmD, BCPS Clinical Staff Pharmacist [email or web address]  Merilynn Finland Stillinger 04/11/2021,8:17 PM

## 2021-04-12 DIAGNOSIS — J431 Panlobular emphysema: Secondary | ICD-10-CM | POA: Diagnosis not present

## 2021-04-12 DIAGNOSIS — F3130 Bipolar disorder, current episode depressed, mild or moderate severity, unspecified: Secondary | ICD-10-CM | POA: Diagnosis not present

## 2021-04-12 DIAGNOSIS — J9621 Acute and chronic respiratory failure with hypoxia: Secondary | ICD-10-CM | POA: Diagnosis not present

## 2021-04-12 DIAGNOSIS — N179 Acute kidney failure, unspecified: Secondary | ICD-10-CM | POA: Diagnosis not present

## 2021-04-12 LAB — COMPREHENSIVE METABOLIC PANEL
ALT: 11 U/L (ref 0–44)
AST: 23 U/L (ref 15–41)
Albumin: 2.8 g/dL — ABNORMAL LOW (ref 3.5–5.0)
Alkaline Phosphatase: 42 U/L (ref 38–126)
Anion gap: 11 (ref 5–15)
BUN: 23 mg/dL — ABNORMAL HIGH (ref 6–20)
CO2: 33 mmol/L — ABNORMAL HIGH (ref 22–32)
Calcium: 8.8 mg/dL — ABNORMAL LOW (ref 8.9–10.3)
Chloride: 94 mmol/L — ABNORMAL LOW (ref 98–111)
Creatinine, Ser: 1.19 mg/dL (ref 0.61–1.24)
GFR, Estimated: >60 mL/min (ref >60)
Glucose, Bld: 107 mg/dL — ABNORMAL HIGH (ref 70–99)
Potassium: 4.3 mmol/L (ref 3.5–5.1)
Sodium: 138 mmol/L (ref 135–145)
Total Bilirubin: 0.8 mg/dL (ref 0.3–1.2)
Total Protein: 6.4 g/dL — ABNORMAL LOW (ref 6.5–8.1)

## 2021-04-12 LAB — CBC WITH DIFFERENTIAL/PLATELET
Abs Immature Granulocytes: 0.03 10*3/uL (ref 0.00–0.07)
Basophils Absolute: 0 10*3/uL (ref 0.0–0.1)
Basophils Relative: 1 %
Eosinophils Absolute: 0.1 10*3/uL (ref 0.0–0.5)
Eosinophils Relative: 2 %
HCT: 60.5 % — ABNORMAL HIGH (ref 39.0–52.0)
Hemoglobin: 18.4 g/dL — ABNORMAL HIGH (ref 13.0–17.0)
Immature Granulocytes: 1 %
Lymphocytes Relative: 37 %
Lymphs Abs: 2.4 10*3/uL (ref 0.7–4.0)
MCH: 25.8 pg — ABNORMAL LOW (ref 26.0–34.0)
MCHC: 30.4 g/dL (ref 30.0–36.0)
MCV: 84.7 fL (ref 80.0–100.0)
Monocytes Absolute: 0.8 10*3/uL (ref 0.1–1.0)
Monocytes Relative: 13 %
Neutro Abs: 3 10*3/uL (ref 1.7–7.7)
Neutrophils Relative %: 46 %
Platelets: 162 10*3/uL (ref 150–400)
RBC: 7.14 MIL/uL — ABNORMAL HIGH (ref 4.22–5.81)
RDW: 20.6 % — ABNORMAL HIGH (ref 11.5–15.5)
WBC: 6.3 10*3/uL (ref 4.0–10.5)
nRBC: 0 % (ref 0.0–0.2)

## 2021-04-12 LAB — GLUCOSE, CAPILLARY
Glucose-Capillary: 141 mg/dL — ABNORMAL HIGH (ref 70–99)
Glucose-Capillary: 149 mg/dL — ABNORMAL HIGH (ref 70–99)
Glucose-Capillary: 270 mg/dL — ABNORMAL HIGH (ref 70–99)
Glucose-Capillary: 235 mg/dL — ABNORMAL HIGH (ref 70–99)

## 2021-04-12 LAB — PHOSPHORUS: Phosphorus: 3.7 mg/dL (ref 2.5–4.6)

## 2021-04-12 LAB — HEPARIN LEVEL (UNFRACTIONATED): Heparin Unfractionated: 0.54 IU/mL (ref 0.30–0.70)

## 2021-04-12 LAB — MAGNESIUM: Magnesium: 1.8 mg/dL (ref 1.7–2.4)

## 2021-04-12 MED ORDER — MAGNESIUM SULFATE 2 GM/50ML IV SOLN
2.0000 g | Freq: Once | INTRAVENOUS | Status: AC
  Administered 2021-04-12: 2 g via INTRAVENOUS
  Filled 2021-04-12: qty 50

## 2021-04-12 MED ORDER — FUROSEMIDE 40 MG PO TABS
60.0000 mg | ORAL_TABLET | Freq: Two times a day (BID) | ORAL | Status: DC
  Administered 2021-04-12 – 2021-04-13 (×2): 60 mg via ORAL
  Filled 2021-04-12 (×2): qty 1

## 2021-04-12 NOTE — Progress Notes (Addendum)
Patient is alert and oriented x4. Patient has been refusing telemetry box to be cardiac monitored. RN educated the patient on the importance of cardiac monitoring with his current condition. Patient verbalize awareness and still refused. Paged and notified Arrien, MD. MD verbal order to discontinue cardiac monitoring. Will inform CCMD and will continue to monitor the patient for any changes.

## 2021-04-12 NOTE — Progress Notes (Signed)
ANTICOAGULATION CONSULT NOTE - Follow Up Consult  Pharmacy Consult for Heparin Indication: pulmonary embolus  No Known Allergies  Patient Measurements: Height: 5\' 6"  (167.6 cm) Weight: 96.3 kg (212 lb 6.4 oz) IBW/kg (Calculated) : 63.8 Heparin Dosing Weight: 85 kg  Vital Signs: Temp: 98.4 F (36.9 C) (07/13 0346) Temp Source: Oral (07/13 0346) BP: 112/78 (07/13 0858) Pulse Rate: 66 (07/13 0858)  Labs: Recent Labs    04/10/21 0418 04/11/21 0207 04/11/21 0822 04/11/21 1910 04/12/21 0424 04/12/21 0622  HGB 17.6* 18.2*  --   --  18.4*  --   HCT 60.0* 61.5*  --   --  60.5*  --   PLT 177 192  --   --  162  --   HEPARINUNFRC  --   --   --  0.62  --  0.54  CREATININE 1.02  --  1.20  --  1.19  --     Estimated Creatinine Clearance: 72.6 mL/min (by C-G formula based on SCr of 1.19 mg/dL).   Assessment: 60 yr old male admitted 03/20/21 and has been on SQ heparin for VTE prophylaxis. Respiratory issues 7/11, chest CTA 7/11 revealed pulmonary embolus. Paroxysmal atrial fibrillation, not on anticoagulants prior to admission due to history of GI bleed. On Protonix 40 mg once daily. Also hx polycythemia. Lower extremity doppler ultrasound confirmed indeterminate bilateral lower extremity DVTs on 7/12.  Patient's heparin level came back at 0.54 on 7/13 and within goal. Continue heparin IV 1400 units/hr.   Goal of Therapy:  Heparin level 0.3-0.7 units/ml Monitor platelets by anticoagulation protocol: Yes   Plan:  Continue heparin IV 1400 units/hr. Continue to monitor H&H and platelets and heparin level.   8/13, PharmD PGY1 Pharmacy Resident  Please check AMION for all [facility] pharmacy phone numbers After 10:00 PM call main pharmacy [phone]
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
## 2021-04-12 NOTE — Plan of Care (Signed)

## 2021-04-13 DIAGNOSIS — F3130 Bipolar disorder, current episode depressed, mild or moderate severity, unspecified: Secondary | ICD-10-CM | POA: Diagnosis not present

## 2021-04-13 DIAGNOSIS — J9621 Acute and chronic respiratory failure with hypoxia: Secondary | ICD-10-CM | POA: Diagnosis not present

## 2021-04-13 DIAGNOSIS — J431 Panlobular emphysema: Secondary | ICD-10-CM | POA: Diagnosis not present

## 2021-04-13 DIAGNOSIS — N179 Acute kidney failure, unspecified: Secondary | ICD-10-CM | POA: Diagnosis not present

## 2021-04-13 LAB — COMPREHENSIVE METABOLIC PANEL
ALT: 10 U/L (ref 0–44)
AST: 15 U/L (ref 15–41)
Albumin: 2.7 g/dL — ABNORMAL LOW (ref 3.5–5.0)
Alkaline Phosphatase: 45 U/L (ref 38–126)
Anion gap: 7 (ref 5–15)
BUN: 25 mg/dL — ABNORMAL HIGH (ref 6–20)
CO2: 38 mmol/L — ABNORMAL HIGH (ref 22–32)
Calcium: 9.1 mg/dL (ref 8.9–10.3)
Chloride: 93 mmol/L — ABNORMAL LOW (ref 98–111)
Creatinine, Ser: 1.42 mg/dL — ABNORMAL HIGH (ref 0.61–1.24)
GFR, Estimated: 57 mL/min — ABNORMAL LOW (ref >60)
Glucose, Bld: 139 mg/dL — ABNORMAL HIGH (ref 70–99)
Potassium: 4 mmol/L (ref 3.5–5.1)
Sodium: 138 mmol/L (ref 135–145)
Total Bilirubin: 0.4 mg/dL (ref 0.3–1.2)
Total Protein: 6.5 g/dL (ref 6.5–8.1)

## 2021-04-13 LAB — CBC WITH DIFFERENTIAL/PLATELET
Abs Immature Granulocytes: 0.03 10*3/uL (ref 0.00–0.07)
Basophils Absolute: 0.1 10*3/uL (ref 0.0–0.1)
Basophils Relative: 1 %
Eosinophils Absolute: 0.1 10*3/uL (ref 0.0–0.5)
Eosinophils Relative: 2 %
HCT: 62 % — ABNORMAL HIGH (ref 39.0–52.0)
Hemoglobin: 18.2 g/dL — ABNORMAL HIGH (ref 13.0–17.0)
Immature Granulocytes: 1 %
Lymphocytes Relative: 36 %
Lymphs Abs: 2.3 10*3/uL (ref 0.7–4.0)
MCH: 25 pg — ABNORMAL LOW (ref 26.0–34.0)
MCHC: 29.4 g/dL — ABNORMAL LOW (ref 30.0–36.0)
MCV: 85.2 fL (ref 80.0–100.0)
Monocytes Absolute: 0.9 10*3/uL (ref 0.1–1.0)
Monocytes Relative: 14 %
Neutro Abs: 3.1 10*3/uL (ref 1.7–7.7)
Neutrophils Relative %: 46 %
Platelets: 164 10*3/uL (ref 150–400)
RBC: 7.28 MIL/uL — ABNORMAL HIGH (ref 4.22–5.81)
RDW: 20.9 % — ABNORMAL HIGH (ref 11.5–15.5)
WBC: 6.4 10*3/uL (ref 4.0–10.5)
nRBC: 0 % (ref 0.0–0.2)

## 2021-04-13 LAB — MAGNESIUM: Magnesium: 2.1 mg/dL (ref 1.7–2.4)

## 2021-04-13 LAB — GLUCOSE, CAPILLARY
Glucose-Capillary: 147 mg/dL — ABNORMAL HIGH (ref 70–99)
Glucose-Capillary: 114 mg/dL — ABNORMAL HIGH (ref 70–99)
Glucose-Capillary: 229 mg/dL — ABNORMAL HIGH (ref 70–99)
Glucose-Capillary: 228 mg/dL — ABNORMAL HIGH (ref 70–99)

## 2021-04-13 LAB — HEPARIN LEVEL (UNFRACTIONATED): Heparin Unfractionated: <0.1 IU/mL — ABNORMAL LOW (ref 0.30–0.70)

## 2021-04-13 MED ORDER — APIXABAN 5 MG PO TABS: 5.0000 mg | ORAL_TABLET | Freq: Two times a day (BID) | ORAL | Status: DC

## 2021-04-13 MED ORDER — APIXABAN 5 MG PO TABS
10.0000 mg | ORAL_TABLET | Freq: Two times a day (BID) | ORAL | Status: DC
  Administered 2021-04-13 – 2021-04-17 (×9): 10 mg via ORAL
  Filled 2021-04-13 (×9): qty 2

## 2021-04-13 NOTE — Progress Notes (Signed)
Called to room by lab. Pt's heparin had been disconnected. Not disconnected by staff. Pharmacy notified that heparin level will be altered.
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
## 2021-04-13 NOTE — Progress Notes (Addendum)
ANTICOAGULATION CONSULT NOTE - Follow Up Consult  Pharmacy Consult for Heparin > Eliquis Indication: pulmonary embolus and DVT  No Known Allergies  Patient Measurements: Height: 5\' 6"  (167.6 cm) Weight: 96.8 kg (213 lb 6.5 oz) IBW/kg (Calculated) : 63.8 Heparin Dosing Weight: 85 kg  Vital Signs: Temp: 97.7 F (36.5 C) (07/14 1145) Temp Source: Oral (07/14 1145) BP: 102/60 (07/14 1145) Pulse Rate: 58 (07/14 1145)  Labs: Recent Labs    04/11/21 0207 04/11/21 0822 04/11/21 1910 04/12/21 0424 04/12/21 0622 04/13/21 0357 04/13/21 0519  HGB 18.2*  --   --  18.4*  --  18.2*  --   HCT 61.5*  --   --  60.5*  --  62.0*  --   PLT 192  --   --  162  --  164  --   HEPARINUNFRC  --   --  0.62  --  0.54  --  <0.10*  CREATININE  --  1.20  --  1.19  --  1.42*  --     Estimated Creatinine Clearance: 61 mL/min (A) (by C-G formula based on SCr of 1.42 mg/dL (H)).  Assessment:  60 yr old male admitted 03/20/21 and has been on SQ heparin for VTE prophylaxis. Respiratory issues 7/11, chest CTA last night revealed pulmonary embolus. Paroxysmal atrial fibrillation, not on anticoagulants prior to admission due to history of GI bleed. On Protonix 40 mg once daily. Also hx polycythemia. Lower extremity doppler ultrasound confirmed indeterminate bilateral lower extremity DVTs on 7/12.    Heparin drip begun 7/12 and level was therapeutic (0.54) on 7/13on 1400 units/hr.  Heparin level undetectable early this morning, and drip was found disconnected. Infusion was resumed and next level was retimed for 12n, about 6 hrs after restart.  Now to transition to Eliquis.  Goal of Therapy:  Heparin level 0.3-0.7 units/ml Appropriate Eliquis dose for indication Monitor platelets by anticoagulation protocol: Yes   Plan:   Eliquis 10 mg PO BID x 1 week, then 5 mg PO BID.  Heparin drip to stop when giving first Eliquis dose.  Monitor for bleeding.   8/13, Dennie Fetters 04/13/2021,11:45 AM

## 2021-04-13 NOTE — Progress Notes (Signed)
PT Cancellation Note  Patient Details Name: Tommy Mathews MRN: [id number] DOB: 07/14/1961   Cancelled Treatment:    Reason Eval/Treat Not Completed: Fatigue/lethargy limiting ability to participate. Pt reports fatigue limiting ability to participate in therapy and having ambulated yesterday. PT will continue to follow as time allows.   Waldemar Dickens, SPT  04/13/2021, 3:21 PM

## 2021-04-13 NOTE — Plan of Care (Signed)

## 2021-04-14 DIAGNOSIS — N179 Acute kidney failure, unspecified: Secondary | ICD-10-CM | POA: Diagnosis not present

## 2021-04-14 DIAGNOSIS — J9621 Acute and chronic respiratory failure with hypoxia: Secondary | ICD-10-CM | POA: Diagnosis not present

## 2021-04-14 DIAGNOSIS — F3130 Bipolar disorder, current episode depressed, mild or moderate severity, unspecified: Secondary | ICD-10-CM | POA: Diagnosis not present

## 2021-04-14 DIAGNOSIS — J431 Panlobular emphysema: Secondary | ICD-10-CM | POA: Diagnosis not present

## 2021-04-14 LAB — COMPREHENSIVE METABOLIC PANEL
ALT: 11 U/L (ref 0–44)
AST: 14 U/L — ABNORMAL LOW (ref 15–41)
Albumin: 2.9 g/dL — ABNORMAL LOW (ref 3.5–5.0)
Alkaline Phosphatase: 46 U/L (ref 38–126)
Anion gap: 8 (ref 5–15)
BUN: 23 mg/dL — ABNORMAL HIGH (ref 6–20)
CO2: 33 mmol/L — ABNORMAL HIGH (ref 22–32)
Calcium: 9 mg/dL (ref 8.9–10.3)
Chloride: 96 mmol/L — ABNORMAL LOW (ref 98–111)
Creatinine, Ser: 1.28 mg/dL — ABNORMAL HIGH (ref 0.61–1.24)
GFR, Estimated: >60 mL/min (ref >60)
Glucose, Bld: 171 mg/dL — ABNORMAL HIGH (ref 70–99)
Potassium: 4 mmol/L (ref 3.5–5.1)
Sodium: 137 mmol/L (ref 135–145)
Total Bilirubin: 0.4 mg/dL (ref 0.3–1.2)
Total Protein: 6.8 g/dL (ref 6.5–8.1)

## 2021-04-14 LAB — GLUCOSE, CAPILLARY
Glucose-Capillary: 135 mg/dL — ABNORMAL HIGH (ref 70–99)
Glucose-Capillary: 126 mg/dL — ABNORMAL HIGH (ref 70–99)
Glucose-Capillary: 125 mg/dL — ABNORMAL HIGH (ref 70–99)

## 2021-04-14 NOTE — Plan of Care (Signed)
  Problem: Education: Goal: Ability to demonstrate management of disease process will improve Outcome: Progressing Goal: Ability to verbalize understanding of medication therapies will improve Outcome: Progressing Goal: Individualized Educational Video(s) Outcome: Progressing   Problem: Activity: Goal: Capacity to carry out activities will improve Outcome: Progressing   Problem: Cardiac: Goal: Ability to achieve and maintain adequate cardiopulmonary perfusion will improve Outcome: Progressing   Problem: Education: Goal: Knowledge of General Education information will improve Description: Including pain rating scale, medication(s)/side effects and non-pharmacologic comfort measures Outcome: Progressing   Problem: Health Behavior/Discharge Planning: Goal: Ability to manage health-related needs will improve Outcome: Progressing   Problem: Clinical Measurements: Goal: Ability to maintain clinical measurements within normal limits will improve Outcome: Progressing Goal: Will remain free from infection Outcome: Progressing Goal: Diagnostic test results will improve Outcome: Progressing Goal: Respiratory complications will improve Outcome: Progressing Goal: Cardiovascular complication will be avoided Outcome: Progressing   Problem: Activity: Goal: Risk for activity intolerance will decrease Outcome: Progressing   Problem: Coping: Goal: Level of anxiety will decrease Outcome: Progressing   Problem: Elimination: Goal: Will not experience complications related to bowel motility Outcome: Progressing Goal: Will not experience complications related to urinary retention Outcome: Progressing   Problem: Pain Managment: Goal: General experience of comfort will improve Outcome: Progressing   Problem: Safety: Goal: Ability to remain free from injury will improve Outcome: Progressing   Problem: Skin Integrity: Goal: Risk for impaired skin integrity will decrease Outcome:  Progressing   

## 2021-04-14 NOTE — Plan of Care (Signed)

## 2021-04-14 NOTE — Discharge Instructions (Signed)
Information on my medicine - ELIQUIS (apixaban)  This medication education was reviewed with me or my healthcare representative as part of my discharge preparation.   Why was Eliquis prescribed for you? Eliquis was prescribed to treat blood clots that may have been found in the veins of your legs (deep vein thrombosis) or in your lungs (pulmonary embolism) and to reduce the risk of them occurring again.  What do You need to know about Eliquis ? The starting dose is 10 mg (two 5 mg tablets) taken TWICE daily for the FIRST SEVEN (7) DAYS, then on 04/20/21  the dose is reduced to ONE 5 mg tablet taken TWICE daily.  Eliquis may be taken with or without food.   Try to take the dose about the same time in the morning and in the evening. If you have difficulty swallowing the tablet whole please discuss with your pharmacist how to take the medication safely.  Take Eliquis exactly as prescribed and DO NOT stop taking Eliquis without talking to the doctor who prescribed the medication.  Stopping may increase your risk of developing a new blood clot.  Refill your prescription before you run out.  After discharge, you should have regular check-up appointments with your healthcare provider that is prescribing your Eliquis.    What do you do if you miss a dose? If a dose of ELIQUIS is not taken at the scheduled time, take it as soon as possible on the same day and twice-daily administration should be resumed. The dose should not be doubled to make up for a missed dose.  Important Safety Information A possible side effect of Eliquis is bleeding. You should call your healthcare provider right away if you experience any of the following: Bleeding from an injury or your nose that does not stop. Unusual colored urine (red or dark brown) or unusual colored stools (red or black). Unusual bruising for unknown reasons. A serious fall or if you hit your head (even if there is no bleeding).  Some medicines  may interact with Eliquis and might increase your risk of bleeding or clotting while on Eliquis. To help avoid this, consult your healthcare provider or pharmacist prior to using any new prescription or non-prescription medications, including herbals, vitamins, non-steroidal anti-inflammatory drugs (NSAIDs) and supplements.  This website has more information on Eliquis (apixaban): http://[email or web address].com/eliquis/home
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
## 2021-04-15 DIAGNOSIS — J431 Panlobular emphysema: Secondary | ICD-10-CM | POA: Diagnosis not present

## 2021-04-15 DIAGNOSIS — E119 Type 2 diabetes mellitus without complications: Secondary | ICD-10-CM | POA: Diagnosis not present

## 2021-04-15 DIAGNOSIS — N179 Acute kidney failure, unspecified: Secondary | ICD-10-CM | POA: Diagnosis not present

## 2021-04-15 DIAGNOSIS — J9621 Acute and chronic respiratory failure with hypoxia: Secondary | ICD-10-CM | POA: Diagnosis not present

## 2021-04-15 LAB — COMPREHENSIVE METABOLIC PANEL WITH GFR
ALT: 11 U/L (ref 0–44)
AST: 15 U/L (ref 15–41)
Albumin: 2.7 g/dL — ABNORMAL LOW (ref 3.5–5.0)
Alkaline Phosphatase: 42 U/L (ref 38–126)
Anion gap: 8 (ref 5–15)
BUN: 18 mg/dL (ref 6–20)
CO2: 32 mmol/L (ref 22–32)
Calcium: 8.7 mg/dL — ABNORMAL LOW (ref 8.9–10.3)
Chloride: 97 mmol/L — ABNORMAL LOW (ref 98–111)
Creatinine, Ser: 1.13 mg/dL (ref 0.61–1.24)
GFR, Estimated: >60 mL/min
Glucose, Bld: 129 mg/dL — ABNORMAL HIGH (ref 70–99)
Potassium: 4.1 mmol/L (ref 3.5–5.1)
Sodium: 137 mmol/L (ref 135–145)
Total Bilirubin: 0.2 mg/dL — ABNORMAL LOW (ref 0.3–1.2)
Total Protein: 6.1 g/dL — ABNORMAL LOW (ref 6.5–8.1)

## 2021-04-15 LAB — GLUCOSE, CAPILLARY
Glucose-Capillary: 126 mg/dL — ABNORMAL HIGH (ref 70–99)
Glucose-Capillary: 101 mg/dL — ABNORMAL HIGH (ref 70–99)
Glucose-Capillary: 254 mg/dL — ABNORMAL HIGH (ref 70–99)
Glucose-Capillary: 72 mg/dL (ref 70–99)

## 2021-04-15 NOTE — Plan of Care (Signed)
?  Problem: Education: ?Goal: Ability to demonstrate management of disease process will improve ?Outcome: Progressing ?  ?Problem: Activity: ?Goal: Capacity to carry out activities will improve ?Outcome: Progressing ?  ?Problem: Cardiac: ?Goal: Ability to achieve and maintain adequate cardiopulmonary perfusion will improve ?Outcome: Progressing ?  ?

## 2021-04-15 NOTE — Progress Notes (Signed)
Physical Therapy Treatment Patient Details Name: Tommy Mathews MRN: [id number] DOB: [birth year] Today's Date: 04/15/2021    History of Present Illness Pt is a 60 y.o. male admitted from [facility] [place] [facility] on 03/20/21 for psychiatric evaluation due to hallucinations and aggressive behavior. CXR with bilateral interstitial vascular infiltrates. Developed worsening hypoxemia despite diuresis. Further workup for PE and BLE DVT, CHF/COPD exacerbation. PMH includes TBI (trach recently decannulated), HTN, afib, DM, schizophrenia, bipolar disorder.   PT Comments    Pt endorses fatigue this session, requiring max encouragement to mobilize OOB. Pt tolerated brief bout of sitting and standing activity, requiring min guard to stand without DME and reaching to furniture for UE support; pt declines ambulation secondary to fatigue and waiting for lunch. Pt remains limited by impaired balance strategies/postural reactions, decreased activity tolerance and cognitive impairment. Will continue to follow acutely to address established goals.  SpO2 down to 85% on 4L O2 with activity; quick return to >/91% with seated rest    Follow Up Recommendations  ALF/LTC;Supervision/Assistance - 24 hour     Equipment Recommendations  None recommended by PT    Recommendations for Other Services       Precautions / Restrictions Precautions Precautions: Fall;Other (comment) Precaution Comments: Watch SpO2 Restrictions Weight Bearing Restrictions: No    Mobility  Bed Mobility Overal bed mobility: Modified Independent                  Transfers Overall transfer level: Needs assistance Equipment used: None Transfers: Sit to/from [employer] Transfers Sit to Stand: Min guard Stand pivot transfers: Min guard       General transfer comment: Able to stand from EOB and take pivotal steps to recliner with min guard for balance, pt maintaining forward flexed/guarded posture and reaching for UE support on  furniture to maintain balance; pt declined further mobility secondary to fatigue and wanting to wait for lunch  Ambulation/Gait                 Stairs             Wheelchair Mobility    Modified Rankin (Stroke Patients Only)       Balance Overall balance assessment: Needs assistance   Sitting balance-Leahy Scale: Fair Sitting balance - Comments: Able to maintain static sitting without UE support; seated balance not challenged   Standing balance support: Single extremity supported;Bilateral upper extremity supported;During functional activity Standing balance-Leahy Scale: Poor Standing balance comment: Reaching for at least single UE support to maintain static and dynamic standing balance                            Cognition Arousal/Alertness: Awake/alert Behavior During Therapy: WFL for tasks assessed/performed Overall Cognitive Status: History of cognitive impairments - at baseline                                 General Comments: H/o TBI. pt following majority of simple commands; increased time for processing; difficult to reason with regarding mobility progression this session, self-limiting      Exercises      General Comments General comments (skin integrity, edema, etc.): SpO2 down to 85% on 4L O2 [place] with brief activity bout, increased to >/91% with seated rest and cues for deep breathing      Pertinent Vitals/Pain Pain Assessment: No/denies pain    Home Living  Prior Function            PT Goals (current goals can now be found in the care plan section) Acute Rehab PT Goals Patient Stated Goal: Eat lunch PT Goal Formulation: With patient Time For Goal Achievement: 04/29/21 Potential to Achieve Goals: Good Progress towards PT goals: Not progressing toward goals - comment (self-limiting, fatigue)    Frequency    Min 2X/week      PT Plan Current plan remains appropriate     Co-evaluation              AM-PAC PT "6 Clicks" Mobility   Outcome Measure  Help needed turning from your back to your side while in a flat bed without using bedrails?: None Help needed moving from lying on your back to sitting on the side of a flat bed without using bedrails?: None Help needed moving to and from a bed to a chair (including a wheelchair)?: A Little Help needed standing up from a chair using your arms (e.g., wheelchair or bedside chair)?: A Little Help needed to walk in hospital room?: A Little Help needed climbing 3-5 steps with a railing? : A Little 6 Click Score: 20    End of Session Equipment Utilized During Treatment: Oxygen Activity Tolerance: Patient limited by fatigue Patient left: in chair;with call bell/phone within reach;with chair alarm set Nurse Communication: Mobility status PT Visit Diagnosis: Unsteadiness on feet (R26.81);Other abnormalities of gait and mobility (R26.89)     Time: 4431-5400 PT Time Calculation (min) (ACUTE ONLY): 11 min  Charges:  $Therapeutic Activity: 8-22 mins                     Ina Homes, PT, DPT Acute Rehabilitation Services  Pager [phone] Office [phone]  Malachy Chamber 04/15/2021, 1:37 PM
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
## 2021-04-16 DIAGNOSIS — N179 Acute kidney failure, unspecified: Secondary | ICD-10-CM | POA: Diagnosis not present

## 2021-04-16 DIAGNOSIS — J431 Panlobular emphysema: Secondary | ICD-10-CM | POA: Diagnosis not present

## 2021-04-16 DIAGNOSIS — F3130 Bipolar disorder, current episode depressed, mild or moderate severity, unspecified: Secondary | ICD-10-CM | POA: Diagnosis not present

## 2021-04-16 DIAGNOSIS — J9621 Acute and chronic respiratory failure with hypoxia: Secondary | ICD-10-CM | POA: Diagnosis not present

## 2021-04-16 LAB — GLUCOSE, CAPILLARY
Glucose-Capillary: 191 mg/dL — ABNORMAL HIGH (ref 70–99)
Glucose-Capillary: 79 mg/dL (ref 70–99)
Glucose-Capillary: 95 mg/dL (ref 70–99)
Glucose-Capillary: 166 mg/dL — ABNORMAL HIGH (ref 70–99)
Glucose-Capillary: 188 mg/dL — ABNORMAL HIGH (ref 70–99)

## 2021-04-16 NOTE — Plan of Care (Signed)
  Problem: Pain Managment: °Goal: General experience of comfort will improve °Outcome: Completed/Met °  °Problem: Elimination: °Goal: Will not experience complications related to urinary retention °Outcome: Completed/Met °  °Problem: Elimination: °Goal: Will not experience complications related to bowel motility °Outcome: Completed/Met °  °
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
## 2021-04-16 NOTE — Progress Notes (Signed)
IVT:  Paged Dr. Lanier Clam to inform of previous IV access removal to report signs of infection .Awaiting response. Primary care nurse was informed of IV assessment.

## 2021-04-17 DIAGNOSIS — R0902 Hypoxemia: Secondary | ICD-10-CM

## 2021-04-17 DIAGNOSIS — F3130 Bipolar disorder, current episode depressed, mild or moderate severity, unspecified: Secondary | ICD-10-CM | POA: Diagnosis not present

## 2021-04-17 DIAGNOSIS — J9621 Acute and chronic respiratory failure with hypoxia: Secondary | ICD-10-CM | POA: Diagnosis not present

## 2021-04-17 DIAGNOSIS — N179 Acute kidney failure, unspecified: Secondary | ICD-10-CM | POA: Diagnosis not present

## 2021-04-17 LAB — VALPROIC ACID LEVEL: Valproic Acid Lvl: 46 ug/mL — ABNORMAL LOW (ref 50.0–100.0)

## 2021-04-17 LAB — GLUCOSE, CAPILLARY
Glucose-Capillary: 127 mg/dL — ABNORMAL HIGH (ref 70–99)
Glucose-Capillary: 196 mg/dL — ABNORMAL HIGH (ref 70–99)

## 2021-04-17 MED ORDER — COVID-19 MRNA VACC (MODERNA) 100 MCG/0.5ML IM SUSP
0.5000 mL | Freq: Once | INTRAMUSCULAR | Status: AC
  Administered 2021-04-17: 0.5 mL via INTRAMUSCULAR
  Filled 2021-04-17: qty 0.5

## 2021-04-17 MED ORDER — MOMETASONE FURO-FORMOTEROL FUM 200-5 MCG/ACT IN AERO: 2.0000 | INHALATION_SPRAY | Freq: Two times a day (BID) | RESPIRATORY_TRACT | Status: DC

## 2021-04-17 MED ORDER — HYDRALAZINE HCL 25 MG PO TABS: 25.0000 mg | ORAL_TABLET | Freq: Three times a day (TID) | ORAL | 0 refills | Status: DC

## 2021-04-17 MED ORDER — DIVALPROEX SODIUM 250 MG PO DR TAB: 750.0000 mg | DELAYED_RELEASE_TABLET | Freq: Two times a day (BID) | ORAL | 0 refills | Status: AC

## 2021-04-17 MED ORDER — APIXABAN 5 MG PO TABS: ORAL_TABLET | ORAL | 0 refills | Status: DC

## 2021-04-17 MED ORDER — HYDRALAZINE HCL 25 MG PO TABS
25.0000 mg | ORAL_TABLET | Freq: Three times a day (TID) | ORAL | Status: DC
  Administered 2021-04-17: 25 mg via ORAL
  Filled 2021-04-17: qty 1

## 2021-04-17 MED ORDER — APIXABAN 5 MG PO TABS
ORAL_TABLET | ORAL | 0 refills | Status: DC
Start: 2021-04-17 — End: 2021-04-17

## 2021-04-17 MED ORDER — INSULIN GLARGINE 100 UNIT/ML ~~LOC~~ SOLN: 10.0000 [IU] | Freq: Every day | SUBCUTANEOUS | 11 refills | Status: AC

## 2021-04-17 MED ORDER — APIXABAN 5 MG PO TABS: 10.0000 mg | ORAL_TABLET | Freq: Two times a day (BID) | ORAL | Status: DC

## 2021-04-17 MED ORDER — INSULIN ASPART 100 UNIT/ML IJ SOLN: 5.0000 [IU] | Freq: Three times a day (TID) | INTRAMUSCULAR | 11 refills | Status: DC

## 2021-04-17 MED ORDER — INSULIN ASPART 100 UNIT/ML IJ SOLN: 0.0000 [IU] | Freq: Three times a day (TID) | INTRAMUSCULAR | 11 refills | Status: DC

## 2021-04-17 MED ORDER — MOMETASONE FURO-FORMOTEROL FUM 200-5 MCG/ACT IN AERO
2.0000 | INHALATION_SPRAY | Freq: Two times a day (BID) | RESPIRATORY_TRACT | Status: DC
  Administered 2021-04-17: 2 via RESPIRATORY_TRACT
  Filled 2021-04-17: qty 8.8

## 2021-04-17 MED ORDER — ALBUTEROL SULFATE (2.5 MG/3ML) 0.083% IN NEBU: 3.0000 mL | INHALATION_SOLUTION | Freq: Four times a day (QID) | RESPIRATORY_TRACT | Status: DC | PRN

## 2021-04-17 MED ORDER — QUETIAPINE FUMARATE 50 MG PO TABS: 50.0000 mg | ORAL_TABLET | Freq: Every morning | ORAL | 0 refills | Status: AC

## 2021-04-17 NOTE — Discharge Summary (Addendum)
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                                                        of paired - Age                                                           Indeterminate       +---------+---------------+---------+-----------+----------+-------------------+ FV DistalFull           Yes      Yes                                      +---------+---------------+---------+-----------+----------+-------------------+ PFV      Full                                                             +---------+---------------+---------+-----------+----------+-------------------+ POP      Full           Yes      Yes                                      +---------+---------------+---------+-----------+----------+-------------------+  PTV      Full                                                             +---------+---------------+---------+-----------+----------+-------------------+ PERO     Full                                                              +---------+---------------+---------+-----------+----------+-------------------+     Summary: BILATERAL: - No evidence of superficial venous thrombosis in the lower extremities, bilaterally. -No evidence of popliteal cyst, bilaterally. RIGHT: - Findings consistent with age indeterminate deep vein thrombosis involving the right common femoral vein, SF junction, right femoral vein, and right popliteal vein.  LEFT: - Findings consistent with age indeterminate deep vein thrombosis involving the left femoral vein.  *See table(s) above for measurements and observations. Electronically signed by Heath Lark on 04/12/2021 at 7:17:26 PM.    Final     Microbiology: No results found for this or any previous visit (from the past 240 hour(s)).   Labs: Basic Metabolic Panel: Recent Labs  Lab 04/11/21 0207 04/11/21 0822 04/12/21 0424 04/13/21 0357 04/14/21 0330 04/15/21 0402  NA  --  138 138 138 137 137  K  --  4.0 4.3 4.0 4.0 4.1  CL  --  95* 94* 93* 96* 97*  CO2  --  33* 33* 38* 33* 32  GLUCOSE  --  127* 107* 139* 171* 129*  BUN  --  20 23* 25* 23* 18  CREATININE  --  1.20 1.19 1.42* 1.28* 1.13  CALCIUM  --  8.9 8.8* 9.1 9.0 8.7*  MG 1.8  --  1.8 2.1  --   --   PHOS 3.5  --  3.7  --   --   --    Liver Function Tests: Recent Labs  Lab 04/11/21 0822 04/12/21 0424 04/13/21 0357 04/14/21 0330 04/15/21 0402  AST 19 23 15  14* 15  ALT 12 11 10 11 11   ALKPHOS 46 42 45 46 42  BILITOT 0.8 0.8 0.4 0.4 0.2*  PROT 7.0 6.4* 6.5 6.8 6.1*  ALBUMIN 2.9* 2.8* 2.7* 2.9* 2.7*   No results for input(s): LIPASE, AMYLASE in the last 168 hours. No results for input(s): AMMONIA in the last 168 hours. CBC: Recent Labs  Lab 04/11/21 0207 04/12/21 0424 04/13/21 0357  WBC 7.2 6.3 6.4  NEUTROABS 3.9 3.0 3.1  HGB 18.2* 18.4* 18.2*  HCT 61.5* 60.5* 62.0*  MCV 85.1 84.7 85.2  PLT 192 162 164   Cardiac Enzymes: No results for input(s): CKTOTAL, CKMB, CKMBINDEX, TROPONINI in the last 168 hours. BNP: BNP  (last 3 results) Recent Labs    01/31/21 2318 02/06/21 1215 03/21/21 1111  BNP 164.1* 28.6 263.8*    ProBNP (last 3 results) No results for input(s): PROBNP in the last 8760 hours.  CBG: Recent Labs  Lab 04/16/21 1230 04/16/21 1720 04/16/21 2042 04/17/21 0607 04/17/21 1027  GLUCAP 95 166* 188* 127* 196*       Signed:  04/19/21 ANP [facility] 04/17/2021, 2:44 PM
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
## 2021-04-17 NOTE — Progress Notes (Addendum)
2:45pm: Patient to be discharged with oxygen tank from unit - discharge summary and AVS given to [facility] ([facility]) officer.  8:45am CSW spoke with Detective Hoontrakul from [facility] regarding patient's discharge plan - final disposition is to be determined.  Edwin Dada, MSW, LCSW Transitions of Care  Clinical Social Worker II [phone]

## 2021-04-17 NOTE — TOC Transition Note (Addendum)
Transition of Care [facility]) - CM/SW Discharge Note   Patient Details  Name: Tommy Mathews MRN: [id number] Date of Birth: 08-24-1961  Transition of Care [facility]) CM/SW Contact:  Curlene Labrum, RN Phone Number: 04/17/2021, 2:55 PM   Clinical Narrative:    Case management met with the [employer] officers at the [place] and the patient is being discharged, taken into custody of the police and transported to the [facility] at [place], [place].  The [employer] report that they are able to transport the patient by police car to the jail and the jail nurse / physician will be assuming care of the patient.  AVS was printed the Ginger, RN attached the Medication requisition that documents the time/ dates of last given medications.  Ginger, RN documented her contact information on the AVS at [phone] to give to the nurse at the jail if she needs discharge clarification or questions regarding the discharge orders/summary.  The patient was placed on [place] oxygen at 2 liters/minute and was transported to the facility on a full portable tank provided by the [place] unit with [place] and extension tubing.  [facility] jail is to provide oxygen support once the patient arrives to the facility for care.  The patient is being discharged to [facility] jail at this time for [employer].   Final next level of care: Group Home Barriers to Discharge: Continued Medical Work up   Patient Goals and CMS Choice Patient states their goals for this hospitalization and ongoing recovery are:: Patient unable to return to [facility] and is agreeable to placement at ALF or Group home. CMS Medicare.gov Compare Post Acute Care list provided to:: Patient Represenative (must comment) (Patient's mother)    Discharge Placement                       Discharge Plan and Services In-house Referral: Clinical Social Work Discharge Planning Services: CM Consult                                  Social Determinants of Health (SDOH) Interventions     Readmission Risk Interventions No flowsheet data found.

## 2021-07-10 ENCOUNTER — Encounter (HOSPITAL_COMMUNITY): Payer: Self-pay

## 2021-07-10 ENCOUNTER — Other Ambulatory Visit: Payer: Self-pay

## 2021-07-10 ENCOUNTER — Inpatient Hospital Stay (HOSPITAL_COMMUNITY)
Admission: EM | Admit: 2021-07-10 | Discharge: 2021-07-18 | DRG: 291 | Payer: Medicaid Other | Attending: Family Medicine | Admitting: Family Medicine

## 2021-07-10 ENCOUNTER — Emergency Department (HOSPITAL_COMMUNITY): Payer: Medicaid Other

## 2021-07-10 ENCOUNTER — Inpatient Hospital Stay (HOSPITAL_COMMUNITY): Payer: Medicaid Other

## 2021-07-10 DIAGNOSIS — Z930 Tracheostomy status: Secondary | ICD-10-CM | POA: Diagnosis not present

## 2021-07-10 DIAGNOSIS — I82531 Chronic embolism and thrombosis of right popliteal vein: Secondary | ICD-10-CM | POA: Diagnosis present

## 2021-07-10 DIAGNOSIS — I5033 Acute on chronic diastolic (congestive) heart failure: Secondary | ICD-10-CM | POA: Diagnosis not present

## 2021-07-10 DIAGNOSIS — J439 Emphysema, unspecified: Secondary | ICD-10-CM | POA: Diagnosis not present

## 2021-07-10 DIAGNOSIS — I82511 Chronic embolism and thrombosis of right femoral vein: Secondary | ICD-10-CM | POA: Diagnosis present

## 2021-07-10 DIAGNOSIS — Z20822 Contact with and (suspected) exposure to covid-19: Secondary | ICD-10-CM | POA: Diagnosis present

## 2021-07-10 DIAGNOSIS — I5022 Chronic systolic (congestive) heart failure: Secondary | ICD-10-CM | POA: Diagnosis not present

## 2021-07-10 DIAGNOSIS — J432 Centrilobular emphysema: Secondary | ICD-10-CM | POA: Diagnosis not present

## 2021-07-10 DIAGNOSIS — I5023 Acute on chronic systolic (congestive) heart failure: Secondary | ICD-10-CM | POA: Diagnosis not present

## 2021-07-10 DIAGNOSIS — Z7901 Long term (current) use of anticoagulants: Secondary | ICD-10-CM

## 2021-07-10 DIAGNOSIS — Z79899 Other long term (current) drug therapy: Secondary | ICD-10-CM

## 2021-07-10 DIAGNOSIS — I110 Hypertensive heart disease with heart failure: Principal | ICD-10-CM | POA: Diagnosis present

## 2021-07-10 DIAGNOSIS — F319 Bipolar disorder, unspecified: Secondary | ICD-10-CM | POA: Diagnosis present

## 2021-07-10 DIAGNOSIS — J189 Pneumonia, unspecified organism: Secondary | ICD-10-CM | POA: Diagnosis present

## 2021-07-10 DIAGNOSIS — E785 Hyperlipidemia, unspecified: Secondary | ICD-10-CM | POA: Diagnosis present

## 2021-07-10 DIAGNOSIS — M7989 Other specified soft tissue disorders: Secondary | ICD-10-CM | POA: Diagnosis not present

## 2021-07-10 DIAGNOSIS — D751 Secondary polycythemia: Secondary | ICD-10-CM | POA: Diagnosis present

## 2021-07-10 DIAGNOSIS — K219 Gastro-esophageal reflux disease without esophagitis: Secondary | ICD-10-CM | POA: Diagnosis present

## 2021-07-10 DIAGNOSIS — Z87820 Personal history of traumatic brain injury: Secondary | ICD-10-CM

## 2021-07-10 DIAGNOSIS — I161 Hypertensive emergency: Secondary | ICD-10-CM | POA: Diagnosis present

## 2021-07-10 DIAGNOSIS — J440 Chronic obstructive pulmonary disease with acute lower respiratory infection: Secondary | ICD-10-CM | POA: Diagnosis present

## 2021-07-10 DIAGNOSIS — Z86711 Personal history of pulmonary embolism: Secondary | ICD-10-CM

## 2021-07-10 DIAGNOSIS — J449 Chronic obstructive pulmonary disease, unspecified: Secondary | ICD-10-CM | POA: Diagnosis present

## 2021-07-10 DIAGNOSIS — E875 Hyperkalemia: Secondary | ICD-10-CM | POA: Diagnosis not present

## 2021-07-10 DIAGNOSIS — Z9981 Dependence on supplemental oxygen: Secondary | ICD-10-CM

## 2021-07-10 DIAGNOSIS — Z8249 Family history of ischemic heart disease and other diseases of the circulatory system: Secondary | ICD-10-CM

## 2021-07-10 DIAGNOSIS — Z7984 Long term (current) use of oral hypoglycemic drugs: Secondary | ICD-10-CM

## 2021-07-10 DIAGNOSIS — I82409 Acute embolism and thrombosis of unspecified deep veins of unspecified lower extremity: Secondary | ICD-10-CM | POA: Diagnosis present

## 2021-07-10 DIAGNOSIS — J9621 Acute and chronic respiratory failure with hypoxia: Secondary | ICD-10-CM | POA: Diagnosis present

## 2021-07-10 DIAGNOSIS — Z6833 Body mass index (BMI) 33.0-33.9, adult: Secondary | ICD-10-CM | POA: Diagnosis not present

## 2021-07-10 DIAGNOSIS — J9601 Acute respiratory failure with hypoxia: Secondary | ICD-10-CM | POA: Diagnosis not present

## 2021-07-10 DIAGNOSIS — I5043 Acute on chronic combined systolic (congestive) and diastolic (congestive) heart failure: Secondary | ICD-10-CM | POA: Diagnosis present

## 2021-07-10 DIAGNOSIS — I480 Paroxysmal atrial fibrillation: Secondary | ICD-10-CM | POA: Diagnosis present

## 2021-07-10 DIAGNOSIS — R0902 Hypoxemia: Secondary | ICD-10-CM

## 2021-07-10 DIAGNOSIS — R0602 Shortness of breath: Secondary | ICD-10-CM | POA: Diagnosis present

## 2021-07-10 DIAGNOSIS — F17210 Nicotine dependence, cigarettes, uncomplicated: Secondary | ICD-10-CM | POA: Diagnosis present

## 2021-07-10 DIAGNOSIS — R0609 Other forms of dyspnea: Secondary | ICD-10-CM | POA: Diagnosis not present

## 2021-07-10 DIAGNOSIS — I272 Pulmonary hypertension, unspecified: Secondary | ICD-10-CM | POA: Diagnosis present

## 2021-07-10 DIAGNOSIS — J810 Acute pulmonary edema: Secondary | ICD-10-CM

## 2021-07-10 DIAGNOSIS — F209 Schizophrenia, unspecified: Secondary | ICD-10-CM | POA: Diagnosis present

## 2021-07-10 DIAGNOSIS — Z794 Long term (current) use of insulin: Secondary | ICD-10-CM | POA: Diagnosis not present

## 2021-07-10 DIAGNOSIS — J9622 Acute and chronic respiratory failure with hypercapnia: Secondary | ICD-10-CM | POA: Diagnosis present

## 2021-07-10 DIAGNOSIS — E669 Obesity, unspecified: Secondary | ICD-10-CM | POA: Diagnosis present

## 2021-07-10 DIAGNOSIS — E119 Type 2 diabetes mellitus without complications: Secondary | ICD-10-CM

## 2021-07-10 DIAGNOSIS — I82401 Acute embolism and thrombosis of unspecified deep veins of right lower extremity: Secondary | ICD-10-CM | POA: Diagnosis not present

## 2021-07-10 DIAGNOSIS — F201 Disorganized schizophrenia: Secondary | ICD-10-CM | POA: Diagnosis not present

## 2021-07-10 DIAGNOSIS — J431 Panlobular emphysema: Secondary | ICD-10-CM | POA: Diagnosis not present

## 2021-07-10 DIAGNOSIS — J9620 Acute and chronic respiratory failure, unspecified whether with hypoxia or hypercapnia: Secondary | ICD-10-CM | POA: Diagnosis present

## 2021-07-10 LAB — I-STAT VENOUS BLOOD GAS, ED
Acid-Base Excess: 8 mmol/L — ABNORMAL HIGH (ref 0.0–2.0)
Bicarbonate: 38.1 mmol/L — ABNORMAL HIGH (ref 20.0–28.0)
Calcium, Ion: 1.02 mmol/L — ABNORMAL LOW (ref 1.15–1.40)
HCT: 59 % — ABNORMAL HIGH (ref 39.0–52.0)
Hemoglobin: 20.1 g/dL — ABNORMAL HIGH (ref 13.0–17.0)
O2 Saturation: 99 %
Potassium: 5.2 mmol/L — ABNORMAL HIGH (ref 3.5–5.1)
Sodium: 141 mmol/L (ref 135–145)
TCO2: 40 mmol/L — ABNORMAL HIGH (ref 22–32)
pCO2, Ven: 70.4 mmHg (ref 44.0–60.0)
pH, Ven: 7.342 (ref 7.250–7.430)
pO2, Ven: 152 mmHg — ABNORMAL HIGH (ref 32.0–45.0)

## 2021-07-10 LAB — COMPREHENSIVE METABOLIC PANEL
ALT: 15 U/L (ref 0–44)
AST: 19 U/L (ref 15–41)
Albumin: 2.8 g/dL — ABNORMAL LOW (ref 3.5–5.0)
Alkaline Phosphatase: 58 U/L (ref 38–126)
Anion gap: 8 (ref 5–15)
BUN: 5 mg/dL — ABNORMAL LOW (ref 6–20)
CO2: 30 mmol/L (ref 22–32)
Calcium: 8.5 mg/dL — ABNORMAL LOW (ref 8.9–10.3)
Chloride: 103 mmol/L (ref 98–111)
Creatinine, Ser: 1.03 mg/dL (ref 0.61–1.24)
GFR, Estimated: >60 mL/min (ref >60)
Glucose, Bld: 167 mg/dL — ABNORMAL HIGH (ref 70–99)
Potassium: 3.9 mmol/L (ref 3.5–5.1)
Sodium: 141 mmol/L (ref 135–145)
Total Bilirubin: 1 mg/dL (ref 0.3–1.2)
Total Protein: 6.8 g/dL (ref 6.5–8.1)

## 2021-07-10 LAB — TROPONIN I (HIGH SENSITIVITY): Troponin I (High Sensitivity): 31 ng/L — ABNORMAL HIGH (ref <18)

## 2021-07-10 LAB — CBC WITH DIFFERENTIAL/PLATELET
Abs Immature Granulocytes: 0.04 10*3/uL (ref 0.00–0.07)
Basophils Absolute: 0 10*3/uL (ref 0.0–0.1)
Basophils Relative: 0 %
Eosinophils Absolute: 0.1 10*3/uL (ref 0.0–0.5)
Eosinophils Relative: 1 %
HCT: 58.6 % — ABNORMAL HIGH (ref 39.0–52.0)
Hemoglobin: 17.3 g/dL — ABNORMAL HIGH (ref 13.0–17.0)
Immature Granulocytes: 1 %
Lymphocytes Relative: 27 %
Lymphs Abs: 1.2 10*3/uL (ref 0.7–4.0)
MCH: 27 pg (ref 26.0–34.0)
MCHC: 29.5 g/dL — ABNORMAL LOW (ref 30.0–36.0)
MCV: 91.4 fL (ref 80.0–100.0)
Monocytes Absolute: 0.5 10*3/uL (ref 0.1–1.0)
Monocytes Relative: 11 %
Neutro Abs: 2.7 10*3/uL (ref 1.7–7.7)
Neutrophils Relative %: 60 %
Platelets: 146 10*3/uL — ABNORMAL LOW (ref 150–400)
RBC: 6.41 MIL/uL — ABNORMAL HIGH (ref 4.22–5.81)
RDW: 20.9 % — ABNORMAL HIGH (ref 11.5–15.5)
WBC: 4.5 10*3/uL (ref 4.0–10.5)
nRBC: 3.1 % — ABNORMAL HIGH (ref 0.0–0.2)

## 2021-07-10 LAB — DIGOXIN LEVEL: Digoxin Level: <0.2 ng/mL — ABNORMAL LOW (ref 0.8–2.0)

## 2021-07-10 LAB — RESP PANEL BY RT-PCR (FLU A&B, COVID) ARPGX2
Influenza A by PCR: NEGATIVE
Influenza B by PCR: NEGATIVE
SARS Coronavirus 2 by RT PCR: NEGATIVE

## 2021-07-10 LAB — BRAIN NATRIURETIC PEPTIDE: B Natriuretic Peptide: 1316 pg/mL — ABNORMAL HIGH (ref 0.0–100.0)

## 2021-07-10 LAB — CBG MONITORING, ED: Glucose-Capillary: 152 mg/dL — ABNORMAL HIGH (ref 70–99)

## 2021-07-10 MED ORDER — ENOXAPARIN SODIUM 100 MG/ML IJ SOSY
1.0000 mg/kg | PREFILLED_SYRINGE | Freq: Two times a day (BID) | INTRAMUSCULAR | Status: DC
  Filled 2021-07-10: qty 1.02

## 2021-07-10 MED ORDER — ACETAMINOPHEN 325 MG PO TABS: 650.0000 mg | ORAL_TABLET | Freq: Four times a day (QID) | ORAL | Status: DC | PRN

## 2021-07-10 MED ORDER — CLONIDINE HCL 0.1 MG PO TABS
0.1000 mg | ORAL_TABLET | Freq: Two times a day (BID) | ORAL | Status: DC
  Administered 2021-07-10 – 2021-07-11 (×2): 0.1 mg via ORAL
  Filled 2021-07-10 (×2): qty 1

## 2021-07-10 MED ORDER — FUROSEMIDE 10 MG/ML IJ SOLN
40.0000 mg | Freq: Once | INTRAMUSCULAR | Status: AC
  Administered 2021-07-10: 40 mg via INTRAVENOUS
  Filled 2021-07-10: qty 4

## 2021-07-10 MED ORDER — ONDANSETRON HCL 4 MG/2ML IJ SOLN: 4.0000 mg | Freq: Four times a day (QID) | INTRAMUSCULAR | Status: DC | PRN

## 2021-07-10 MED ORDER — ACETAMINOPHEN 650 MG RE SUPP: 650.0000 mg | Freq: Four times a day (QID) | RECTAL | Status: DC | PRN

## 2021-07-10 MED ORDER — FUROSEMIDE 10 MG/ML IJ SOLN
40.0000 mg | Freq: Two times a day (BID) | INTRAMUSCULAR | Status: DC
  Administered 2021-07-11: 40 mg via INTRAVENOUS
  Filled 2021-07-10: qty 4

## 2021-07-10 MED ORDER — IOHEXOL 350 MG/ML SOLN
80.0000 mL | Freq: Once | INTRAVENOUS | Status: AC | PRN
  Administered 2021-07-10: 80 mL via INTRAVENOUS

## 2021-07-10 MED ORDER — ONDANSETRON HCL 4 MG PO TABS: 4.0000 mg | ORAL_TABLET | Freq: Four times a day (QID) | ORAL | Status: DC | PRN

## 2021-07-10 MED ORDER — INSULIN ASPART 100 UNIT/ML IJ SOLN: 0.0000 [IU] | Freq: Every day | INTRAMUSCULAR | Status: DC

## 2021-07-10 MED ORDER — METHYLPREDNISOLONE SODIUM SUCC 125 MG IJ SOLR
125.0000 mg | Freq: Once | INTRAMUSCULAR | Status: AC
  Administered 2021-07-10: 125 mg via INTRAVENOUS
  Filled 2021-07-10: qty 2

## 2021-07-10 MED ORDER — NITROGLYCERIN IN D5W 200-5 MCG/ML-% IV SOLN
0.0000 ug/min | INTRAVENOUS | Status: DC
  Administered 2021-07-10: 5 ug/min via INTRAVENOUS
  Administered 2021-07-11: 150 ug/min via INTRAVENOUS
  Filled 2021-07-10 (×6): qty 250

## 2021-07-10 MED ORDER — IPRATROPIUM BROMIDE 0.02 % IN SOLN
0.5000 mg | Freq: Four times a day (QID) | RESPIRATORY_TRACT | Status: DC
  Administered 2021-07-10 – 2021-07-11 (×4): 0.5 mg via RESPIRATORY_TRACT
  Filled 2021-07-10 (×4): qty 2.5

## 2021-07-10 MED ORDER — DIGOXIN 125 MCG PO TABS
0.2500 mg | ORAL_TABLET | Freq: Every day | ORAL | Status: DC
  Administered 2021-07-10 – 2021-07-18 (×9): 0.25 mg via ORAL
  Filled 2021-07-10 (×9): qty 2

## 2021-07-10 MED ORDER — HYDRALAZINE HCL 25 MG PO TABS
25.0000 mg | ORAL_TABLET | Freq: Three times a day (TID) | ORAL | Status: DC
  Administered 2021-07-10 – 2021-07-11 (×3): 25 mg via ORAL
  Filled 2021-07-10 (×3): qty 1

## 2021-07-10 MED ORDER — ENALAPRILAT 1.25 MG/ML IV SOLN
1.2500 mg | Freq: Once | INTRAVENOUS | Status: AC
  Administered 2021-07-10: 1.25 mg via INTRAVENOUS
  Filled 2021-07-10: qty 1

## 2021-07-10 MED ORDER — QUETIAPINE FUMARATE 100 MG PO TABS
200.0000 mg | ORAL_TABLET | Freq: Every day | ORAL | Status: DC
  Administered 2021-07-10 – 2021-07-17 (×8): 200 mg via ORAL
  Filled 2021-07-10: qty 2
  Filled 2021-07-10: qty 1
  Filled 2021-07-10 (×6): qty 2

## 2021-07-10 MED ORDER — NITROGLYCERIN 0.4 MG SL SUBL: 0.4000 mg | SUBLINGUAL_TABLET | SUBLINGUAL | Status: DC | PRN

## 2021-07-10 MED ORDER — INSULIN DETEMIR 100 UNIT/ML ~~LOC~~ SOLN
10.0000 [IU] | Freq: Every day | SUBCUTANEOUS | Status: DC
  Administered 2021-07-10 – 2021-07-17 (×7): 10 [IU] via SUBCUTANEOUS
  Filled 2021-07-10 (×9): qty 0.1

## 2021-07-10 MED ORDER — LORAZEPAM 2 MG/ML IJ SOLN
0.5000 mg | Freq: Once | INTRAMUSCULAR | Status: AC
  Administered 2021-07-10: 0.5 mg via INTRAVENOUS
  Filled 2021-07-10: qty 1

## 2021-07-10 MED ORDER — ALBUTEROL SULFATE (2.5 MG/3ML) 0.083% IN NEBU: 2.5000 mg | INHALATION_SOLUTION | RESPIRATORY_TRACT | Status: DC | PRN

## 2021-07-10 MED ORDER — ENOXAPARIN SODIUM 100 MG/ML IJ SOSY
100.0000 mg | PREFILLED_SYRINGE | Freq: Two times a day (BID) | INTRAMUSCULAR | Status: DC
  Filled 2021-07-10 (×2): qty 1

## 2021-07-10 MED ORDER — QUETIAPINE FUMARATE 50 MG PO TABS
50.0000 mg | ORAL_TABLET | Freq: Every day | ORAL | Status: DC
  Administered 2021-07-11 – 2021-07-18 (×8): 50 mg via ORAL
  Filled 2021-07-10 (×8): qty 1

## 2021-07-10 MED ORDER — IPRATROPIUM-ALBUTEROL 0.5-2.5 (3) MG/3ML IN SOLN
3.0000 mL | RESPIRATORY_TRACT | Status: AC
  Administered 2021-07-10 (×3): 3 mL via RESPIRATORY_TRACT
  Filled 2021-07-10: qty 3
  Filled 2021-07-10: qty 6

## 2021-07-10 MED ORDER — FLUOXETINE HCL 20 MG PO CAPS
20.0000 mg | ORAL_CAPSULE | Freq: Every day | ORAL | Status: DC
  Administered 2021-07-10 – 2021-07-18 (×9): 20 mg via ORAL
  Filled 2021-07-10 (×10): qty 1

## 2021-07-10 MED ORDER — DIVALPROEX SODIUM 500 MG PO DR TAB
750.0000 mg | DELAYED_RELEASE_TABLET | Freq: Two times a day (BID) | ORAL | Status: DC
  Administered 2021-07-10 – 2021-07-18 (×16): 750 mg via ORAL
  Filled 2021-07-10 (×2): qty 1
  Filled 2021-07-10: qty 3
  Filled 2021-07-10 (×13): qty 1
  Filled 2021-07-10: qty 3
  Filled 2021-07-10: qty 1

## 2021-07-10 MED ORDER — INSULIN ASPART 100 UNIT/ML IJ SOLN
0.0000 [IU] | Freq: Three times a day (TID) | INTRAMUSCULAR | Status: DC
  Administered 2021-07-11: 1 [IU] via SUBCUTANEOUS
  Administered 2021-07-11: 2 [IU] via SUBCUTANEOUS
  Administered 2021-07-12: 1 [IU] via SUBCUTANEOUS
  Administered 2021-07-12: 2 [IU] via SUBCUTANEOUS
  Administered 2021-07-13: 3 [IU] via SUBCUTANEOUS
  Administered 2021-07-13 – 2021-07-14 (×2): 2 [IU] via SUBCUTANEOUS
  Administered 2021-07-14: 3 [IU] via SUBCUTANEOUS
  Administered 2021-07-15: 1 [IU] via SUBCUTANEOUS
  Administered 2021-07-15: 2 [IU] via SUBCUTANEOUS
  Administered 2021-07-16: 1 [IU] via SUBCUTANEOUS
  Administered 2021-07-16: 2 [IU] via SUBCUTANEOUS
  Administered 2021-07-17: 3 [IU] via SUBCUTANEOUS
  Administered 2021-07-18 (×2): 2 [IU] via SUBCUTANEOUS

## 2021-07-10 NOTE — ED Triage Notes (Signed)
Pt to er room number 5, pt here for sob, pt presents on NRB and has bloody sputum from trach stoma.  Pt placed on 4l o2  via [place] and satting mid 90s, per officer, pt is normally on 4L

## 2021-07-10 NOTE — H&P (Signed)
History and Physical    Tommy Mathews YTK:[id number] DOB: 01-Nov-1960 DOA: 07/10/2021  PCP: Georgann Housekeeper, MD  Patient coming from: [address]  I have personally briefly reviewed patient's old medical records available.   Chief Complaint: Shortness of breath.  HPI: Tommy Mathews is a 60 y.o. male with medical history significant of traumatic brain injury and prior tracheostomy, hypertension, hyperlipidemia, paroxysmal A. fib, type 2 diabetes on insulin, polycythemia, COPD with chronic hypoxemic respiratory failure on 2 to 4 L of oxygen at home and is schizophrenia with bipolar disorder who was recently admitted to hospital and discharged to the law enforcement currently residing in correctional facility brought to the emergency room with sudden onset of shortness of breath today morning.  Patient is poor historian.  He is currently on BiPAP.  Security at the bedside.  Reportedly he is on oxygen but not using it consistently.  Patient is usually on 4 L of oxygen.  He was noted to be more short of breath since morning which was all of a sudden onset, has some cough and frothy sputum through the tracheostomy.  He also has a leg swelling which is at about his baseline.  He was given a dose of Lasix and brought to the ER on 15 L of oxygen.  As mentioned, patient is poor historian and unable to provide much information. ED Course: Hypertensive with blood pressure 215/117.  Improved on 4 L of oxygen, noted to be in respiratory distress and started on BiPAP, given a dose of Lasix with good diuresis.  Electrolytes are normal.  Chest x-ray with bilateral interstitial edema and trace effusion.  COVID-19 and influenza swab negative.  Due to persistently elevated blood pressures and need for respiratory support, started on nitroglycerin drip and admission was requested.  Sudden onset of respiratory symptoms, suspect flash pulmonary edema.  His medical administration records indicate that he is no longer on Eliquis.   Patient unable to tell me whether patient is having any chest pain.   Review of Systems: all systems are reviewed and pertinent positive as per HPI otherwise rest are negative.    Past Medical History:  Diagnosis Date   Bipolar 1 disorder (HCC)    COPD (chronic obstructive pulmonary disease) (HCC)    Diabetes (HCC)    GERD (gastroesophageal reflux disease)    HLD (hyperlipidemia)    HTN (hypertension)    Schizophrenia (HCC) 01/31/2021    Past Surgical History:  Procedure Laterality Date   IR GASTROSTOMY TUBE REMOVAL  08/15/2018   PEG PLACEMENT N/A 01/21/2018   Procedure: PERCUTANEOUS ENDOSCOPIC GASTROSTOMY (PEG) PLACEMENT;  Surgeon: Wyline Mood, MD;  Location: [facility] ENDOSCOPY;  Service: Gastroenterology;  Laterality: N/A;   TRACHEOSTOMY TUBE PLACEMENT N/A 01/23/2018   Procedure: TRACHEOSTOMY;  Surgeon: Bud Face, MD;  Location: [facility] ORS;  Service: ENT;  Laterality: N/A;    Social history   reports that he has been smoking cigarettes. He started smoking about 22 years ago. He has a 40.00 pack-year smoking history. He has never used smokeless tobacco. He reports current alcohol use. He reports that he does not use drugs.  No Known Allergies  History reviewed. No pertinent family history.   Prior to Admission medications   Medication Sig Start Date End Date Taking? Authorizing Provider  cloNIDine (CATAPRES) 0.1 MG tablet Take 0.1 mg by mouth 2 (two) times daily.   Yes [provider]  digoxin (LANOXIN) 0.25 MG tablet Take 1 tablet (0.25 mg total) by mouth daily. 09/02/19  Yes Christian,  Rylee, MD  divalproex (DEPAKOTE) 250 MG DR tablet Take 3 tablets (750 mg total) by mouth 2 (two) times daily. 04/17/21 09/09/21 Yes Russella Dar, NP  FLUoxetine (PROZAC) 20 MG capsule Take 1 capsule (20 mg total) by mouth daily. 09/02/19  Yes Christian, Rylee, MD  hydrALAZINE (APRESOLINE) 25 MG tablet Take 1 tablet (25 mg total) by mouth every 8 (eight) hours. 04/17/21 09/09/21 Yes  Arrien, York Ram, MD  insulin glargine (LANTUS) 100 UNIT/ML injection Inject 0.1 mLs (10 Units total) into the skin daily. Patient taking differently: Inject 10 Units into the skin at bedtime. 04/18/21  Yes Russella Dar, NP  insulin regular (NOVOLIN R) 100 units/mL injection Inject 2-12 Units into the skin See admin instructions. Three times daily per sliding scale   Yes [provider]  QUEtiapine (SEROQUEL) 200 MG tablet Take 1 tablet (200 mg total) by mouth at bedtime. 09/01/19  Yes Christian, Rylee, MD  QUEtiapine (SEROQUEL) 50 MG tablet Take 1 tablet (50 mg total) by mouth in the morning. 04/17/21 09/09/21 Yes Russella Dar, NP  apixaban (ELIQUIS) 5 MG TABS tablet Take 2 tablets twice daily through 04/19/21 then start taking 1 tablet twice daily on 04/20/21 Patient not taking: No sig reported 04/17/21   Russella Dar, NP  clonazePAM (KLONOPIN) 0.5 MG tablet Take 0.5 tablets (0.25 mg total) by mouth 2 (two) times daily. Patient not taking: No sig reported 03/30/21   Coralie Keens, MD    Physical Exam: Vitals:   07/10/21 1422 07/10/21 1430 07/10/21 1451 07/10/21 1526  BP: (!) 185/120 (!) 171/151 (!) 196/120 (!) 182/122  Pulse: 83 83 82 84  Resp: 14 19 19 19   Temp:      TempSrc:      SpO2: 95% 92% 97% 96%  Weight:      Height:        Constitutional: Sick looking.  Currently on 50% FiO2 on BiPAP.  Saturating adequate. Vitals:   07/10/21 1422 07/10/21 1430 07/10/21 1451 07/10/21 1526  BP: (!) 185/120 (!) 171/151 (!) 196/120 (!) 182/122  Pulse: 83 83 82 84  Resp: 14 19 19 19   Temp:      TempSrc:      SpO2: 95% 92% 97% 96%  Weight:      Height:       Eyes: PERRL, lids and conjunctivae normal ENMT: Mucous membranes are moist. Posterior pharynx clear of any exudate or lesions.Normal dentition.  Neck: normal, supple, no masses, no thyromegaly Respiratory: Bilateral conducted airway sounds.  Upper airway noise with BiPAP on. Cardiovascular: Regular  rate and rhythm, no murmurs / rubs / gallops.  2+ bilateral pedal edema.  2+ pedal pulses. No carotid bruits.  Abdomen: no tenderness, no masses palpated. No hepatosplenomegaly. Bowel sounds positive.  Musculoskeletal: no clubbing / cyanosis. No joint deformity upper and lower extremities. Good ROM, no contractures. Normal muscle tone.  Skin: no rashes, lesions, ulcers. No induration Neurologic: CN 2-12 grossly intact. Sensation intact, DTR normal.  Moves all extremities. Psychiatric: Flat affect.  Anxious.    Labs on Admission: I have personally reviewed following labs and imaging studies  CBC: Recent Labs  Lab 07/10/21 1036 07/10/21 1209  WBC 4.5  --   NEUTROABS 2.7  --   HGB 17.3* 20.1*  HCT 58.6* 59.0*  MCV 91.4  --   PLT 146*  --    Basic Metabolic Panel: Recent Labs  Lab 07/10/21 1036 07/10/21 1209  NA 141 141  K 3.9  5.2*  CL 103  --   CO2 30  --   GLUCOSE 167*  --   BUN 5*  --   CREATININE 1.03  --   CALCIUM 8.5*  --    GFR: Estimated Creatinine Clearance: 91 mL/min (by C-G formula based on SCr of 1.03 mg/dL). Liver Function Tests: Recent Labs  Lab 07/10/21 1036  AST 19  ALT 15  ALKPHOS 58  BILITOT 1.0  PROT 6.8  ALBUMIN 2.8*   No results for input(s): LIPASE, AMYLASE in the last 168 hours. No results for input(s): AMMONIA in the last 168 hours. Coagulation Profile: No results for input(s): INR, PROTIME in the last 168 hours. Cardiac Enzymes: No results for input(s): CKTOTAL, CKMB, CKMBINDEX, TROPONINI in the last 168 hours. BNP (last 3 results) No results for input(s): PROBNP in the last 8760 hours. HbA1C: No results for input(s): HGBA1C in the last 72 hours. CBG: No results for input(s): GLUCAP in the last 168 hours. Lipid Profile: No results for input(s): CHOL, HDL, LDLCALC, TRIG, CHOLHDL, LDLDIRECT in the last 72 hours. Thyroid Function Tests: No results for input(s): TSH, T4TOTAL, FREET4, T3FREE, THYROIDAB in the last 72 hours. Anemia  Panel: No results for input(s): VITAMINB12, FOLATE, FERRITIN, TIBC, IRON, RETICCTPCT in the last 72 hours. Urine analysis:    Component Value Date/Time   COLORURINE YELLOW 03/20/2021 1652   APPEARANCEUR CLEAR 03/20/2021 1652   LABSPEC 1.010 03/20/2021 1652   PHURINE 6.0 03/20/2021 1652   GLUCOSEU NEGATIVE 03/20/2021 1652   HGBUR NEGATIVE 03/20/2021 1652   BILIRUBINUR NEGATIVE 03/20/2021 1652   KETONESUR NEGATIVE 03/20/2021 1652   PROTEINUR NEGATIVE 03/20/2021 1652   NITRITE NEGATIVE 03/20/2021 1652   LEUKOCYTESUR NEGATIVE 03/20/2021 1652    Radiological Exams on Admission: DG Chest Port 1 View  Result Date: 07/10/2021 CLINICAL DATA:  Shortness of breath. EXAM: PORTABLE CHEST 1 VIEW COMPARISON:  April 07, 2021. FINDINGS: Stable cardiomegaly. Increased bilateral perihilar and basilar opacities are noted concerning for edema or possibly pneumonia. Small bilateral pleural effusions are noted. Bony thorax is unremarkable. IMPRESSION: Increased bilateral lung opacities are noted concerning for worsening edema or pneumonia, with small bilateral pleural effusions. Electronically Signed   By: Lupita Raider M.D.   On: 07/10/2021 12:20    EKG: Independently reviewed.  Sinus rhythm.  Low voltage EKG.  Comparison to previous EKGs.  Assessment/Plan Principal Problem:   Acute on chronic respiratory failure with hypoxia (HCC) Active Problems:   Insulin-requiring or dependent type II diabetes mellitus (HCC)   Schizophrenia (HCC)   Chronic HFrEF (heart failure with reduced ejection fraction) (HCC)   COPD (chronic obstructive pulmonary disease) (HCC)   DVT (deep venous thrombosis) (HCC)   Acute hypoxemic respiratory failure (HCC)   Acute on chronic respiratory failure (HCC)     1.  Acute on chronic hypoxemic respiratory failure: Suspect multifactorial.  Suspect due to underlying acute diastolic congestive heart failure.  Hypertensive emergency. Agree with admission to progressive care unit  given necessity to use BiPAP and nitroglycerin. Keep on BiPAP until clinically stabilizes and becomes more comfortable. Transition to nasal cannula oxygen, uses 4 L oxygen at home to keep saturation more than 90%. Start diuresing with IV Lasix 40 mg twice a day.  Patient is not on any maintenance Lasix at home.  Previous echocardiogram with normal ejection fraction, grade 1 diastolic dysfunction.  Previously very good response to diuresis. Repeat echocardiogram, last echocardiogram 18-month-old. Intake output monitoring.  Renal functions monitoring.  Daily weight. Bronchodilator therapy. Patient  will stay on nitroglycerin drip.  Will resume home medications including clonidine and hydralazine.  Once blood pressure improved and systolic blood pressure less than 160, discontinue nitroglycerin drip.  2.  History of PE and DVT: Patient has history of submassive PE and DVT on 04/10/21.  Medication administration records shows that he has been off of anticoagulation.  He is mostly bedbound and incarcerated.  Very high risk of persistent or recurrent DVT and PE. Started on therapeutic anticoagulation with Lovenox 1 g/kg twice daily.  We will check CT angiogram as well as lower extremity duplexes.  3.  Schizophrenia: Patient on Seroquel.  That he will continue.  4.  Type 2 diabetes on insulin: Well-controlled.  On long-acting insulin.  Continue.  Also started on sliding scale insulin.  5.  Tracheostomy status: With some secretions through the tracheostomy.  Less likely infected.  Local wound care.   DVT prophylaxis: Lovenox subcu Code Status: Full code Family Communication: None Disposition Plan: Probably back to correctional facility Consults called: None Admission status: Inpatient.  Progressive care unit to use nitroglycerin and BiPAP.   Dorcas Carrow MD [facility] Pager [phone]

## 2021-07-10 NOTE — ED Provider Notes (Signed)
[facility] EMERGENCY DEPARTMENT Provider Note   CSN: [id number] Arrival date & time: 07/10/21  1016     History Chief Complaint  Patient presents with   Shortness of Breath    Tommy Mathews is a 60 y.o. male.  60 yo M with a chief complaint of shortness of breath.  Started this morning per the patient.  Having a bit of a cough denies fevers denies sick contacts.  Leg swelling is at baseline.  He is currently incarcerated and was picked up by EMS.  Found on 15 L of oxygen.  He had gotten Lasix at prison just prior to their arrival.  The history is provided by the patient, the EMS personnel and medical records.  Shortness of Breath Severity:  Severe Onset quality:  Sudden Duration:  2 hours Timing:  Constant Progression:  Unchanged Chronicity:  New Relieved by:  Nothing Worsened by:  Nothing Ineffective treatments:  None tried Associated symptoms: cough   Associated symptoms: no abdominal pain, no chest pain, no fever, no headaches, no rash and no vomiting       Past Medical History:  Diagnosis Date   Bipolar 1 disorder (HCC)    COPD (chronic obstructive pulmonary disease) (HCC)    Diabetes (HCC)    GERD (gastroesophageal reflux disease)    HLD (hyperlipidemia)    HTN (hypertension)    Schizophrenia (HCC) 01/31/2021    Patient Active Problem List   Diagnosis Date Noted   Acute hypoxemic respiratory failure (HCC) 07/10/2021   Hypoxia    DVT (deep venous thrombosis) (HCC) 04/11/2021   Tracheostomy dependence (HCC)    Chronic respiratory failure with hypoxia (HCC)    COPD with acute bronchitis (HCC) 02/01/2021   Polycythemia 02/01/2021   Renal insufficiency 02/01/2021   Acute on chronic diastolic CHF (congestive heart failure) (HCC) 12/14/2019   COPD (chronic obstructive pulmonary disease) (HCC)    Sepsis with acute hypoxic respiratory failure without septic shock (HCC)    Chronic HFrEF (heart failure with reduced ejection fraction) (HCC)  08/24/2019   Thrombocytopenia (HCC) 08/24/2019   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/2018   Bipolar I disorder, most recent episode depressed (HCC) 03/03/2018   Schizophrenia (HCC) 02/27/2018   GI bleed 01/06/2018   Acute on chronic respiratory failure with hypoxia (HCC) 01/06/2018   Pneumonia of both lungs due to infectious organism 01/06/2018   AKI (acute kidney injury) (HCC) 01/06/2018   Insulin-requiring or dependent type II diabetes mellitus (HCC) 01/06/2018    Past Surgical History:  Procedure Laterality Date   IR GASTROSTOMY TUBE REMOVAL  08/15/2018   PEG PLACEMENT N/A 01/21/2018   Procedure: PERCUTANEOUS ENDOSCOPIC GASTROSTOMY (PEG) PLACEMENT;  Surgeon: Wyline Mood, MD;  Location: [facility] ENDOSCOPY;  Service: Gastroenterology;  Laterality: N/A;   TRACHEOSTOMY TUBE PLACEMENT N/A 01/23/2018   Procedure: TRACHEOSTOMY;  Surgeon: Bud Face, MD;  Location: [facility] ORS;  Service: ENT;  Laterality: N/A;       History reviewed. No pertinent family history.  Social History   Tobacco Use   Smoking status: Every Day    Packs/day: 1.00    Years: 40.00    Pack years: 40.00    Types: Cigarettes    Start date: 2000   Smokeless tobacco: Never  Vaping Use   Vaping Use: Never used  Substance Use Topics   Alcohol use: Yes   Drug use: Never    Home Medications Prior to Admission medications   Medication Sig Start Date End Date Taking?  Authorizing Provider  cloNIDine (CATAPRES) 0.1 MG tablet Take 0.1 mg by mouth 2 (two) times daily.   Yes [provider]  digoxin (LANOXIN) 0.25 MG tablet Take 1 tablet (0.25 mg total) by mouth daily. 09/02/19  Yes Christian, Rylee, MD  divalproex (DEPAKOTE) 250 MG DR tablet Take 3 tablets (750 mg total) by mouth 2 (two) times daily. 04/17/21 09/09/21 Yes Russella Dar, NP  FLUoxetine (PROZAC) 20 MG capsule Take 1 capsule (20 mg total) by mouth daily. 09/02/19  Yes Christian, Rylee, MD  hydrALAZINE  (APRESOLINE) 25 MG tablet Take 1 tablet (25 mg total) by mouth every 8 (eight) hours. 04/17/21 09/09/21 Yes Arrien, York Ram, MD  insulin glargine (LANTUS) 100 UNIT/ML injection Inject 0.1 mLs (10 Units total) into the skin daily. Patient taking differently: Inject 10 Units into the skin at bedtime. 04/18/21  Yes Russella Dar, NP  insulin regular (NOVOLIN R) 100 units/mL injection Inject 2-12 Units into the skin See admin instructions. Three times daily per sliding scale   Yes [provider]  QUEtiapine (SEROQUEL) 200 MG tablet Take 1 tablet (200 mg total) by mouth at bedtime. 09/01/19  Yes Christian, Rylee, MD  QUEtiapine (SEROQUEL) 50 MG tablet Take 1 tablet (50 mg total) by mouth in the morning. 04/17/21 09/09/21 Yes Russella Dar, NP  apixaban (ELIQUIS) 5 MG TABS tablet Take 2 tablets twice daily through 04/19/21 then start taking 1 tablet twice daily on 04/20/21 Patient not taking: No sig reported 04/17/21   Russella Dar, NP  clonazePAM (KLONOPIN) 0.5 MG tablet Take 0.5 tablets (0.25 mg total) by mouth 2 (two) times daily. Patient not taking: No sig reported 03/30/21   Arrien, York Ram, MD    Allergies    Patient has no known allergies.  Review of Systems   Review of Systems  Constitutional:  Negative for chills and fever.  HENT:  Negative for congestion and facial swelling.   Eyes:  Negative for discharge and visual disturbance.  Respiratory:  Positive for cough and shortness of breath.   Cardiovascular:  Positive for leg swelling. Negative for chest pain and palpitations.  Gastrointestinal:  Negative for abdominal pain, diarrhea and vomiting.  Musculoskeletal:  Negative for arthralgias and myalgias.  Skin:  Negative for color change and rash.  Neurological:  Negative for tremors, syncope and headaches.  Psychiatric/Behavioral:  Negative for confusion and dysphoric mood.    Physical Exam Updated Vital Signs BP (!) 182/122 (BP Location: Right Arm)    Pulse 84   Temp (!) 97 F (36.1 C) (Axillary)   Resp 19   Ht 5\' 9"  (1.753 m)   Wt 102.1 kg   SpO2 96%   BMI 33.23 kg/m   Physical Exam Vitals and nursing note reviewed.  Constitutional:      Appearance: He is well-developed.  HENT:     Head: Normocephalic and atraumatic.  Eyes:     Pupils: Pupils are equal, round, and reactive to light.  Neck:     Vascular: No JVD.     Comments: Old tracheostomy scar with pink frothy sputum. Cardiovascular:     Rate and Rhythm: Normal rate and regular rhythm.     Heart sounds: No murmur heard.   No friction rub. No gallop.  Pulmonary:     Effort: No respiratory distress.     Breath sounds: Wheezing and rales present.     Comments: Coarse breath sounds in all fields tachypnea prolonged expiratory effort Abdominal:  General: There is no distension.     Tenderness: There is no abdominal tenderness. There is no guarding or rebound.  Musculoskeletal:        General: Normal range of motion.     Cervical back: Normal range of motion and neck supple.  Skin:    Coloration: Skin is not pale.     Findings: No rash.  Neurological:     Mental Status: He is alert and oriented to person, place, and time.  Psychiatric:        Behavior: Behavior normal.    ED Results / Procedures / Treatments   Labs (all labs ordered are listed, but only abnormal results are displayed) Labs Reviewed  CBC WITH DIFFERENTIAL/PLATELET - Abnormal; Notable for the following components:      Result Value   RBC 6.41 (*)    Hemoglobin 17.3 (*)    HCT 58.6 (*)    MCHC 29.5 (*)    RDW 20.9 (*)    Platelets 146 (*)    nRBC 3.1 (*)    All other components within normal limits  COMPREHENSIVE METABOLIC PANEL - Abnormal; Notable for the following components:   Glucose, Bld 167 (*)    BUN 5 (*)    Calcium 8.5 (*)    Albumin 2.8 (*)    All other components within normal limits  BRAIN NATRIURETIC PEPTIDE - Abnormal; Notable for the following components:   B  Natriuretic Peptide 1,316.0 (*)    All other components within normal limits  DIGOXIN LEVEL - Abnormal; Notable for the following components:   Digoxin Level <0.2 (*)    All other components within normal limits  I-STAT VENOUS BLOOD GAS, ED - Abnormal; Notable for the following components:   pCO2, Ven 70.4 (*)    pO2, Ven 152.0 (*)    Bicarbonate 38.1 (*)    TCO2 40 (*)    Acid-Base Excess 8.0 (*)    Potassium 5.2 (*)    Calcium, Ion 1.02 (*)    HCT 59.0 (*)    Hemoglobin 20.1 (*)    All other components within normal limits  TROPONIN I (HIGH SENSITIVITY) - Abnormal; Notable for the following components:   Troponin I (High Sensitivity) 31 (*)    All other components within normal limits  RESP PANEL BY RT-PCR (FLU A&B, COVID) ARPGX2  PATHOLOGIST SMEAR REVIEW    EKG EKG Interpretation  Date/Time:  Monday July 10 2021 12:10:39 EDT Ventricular Rate:  76 PR Interval:  129 QRS Duration: 99 QT Interval:  466 QTC Calculation: 524 R Axis:   26 Text Interpretation: Sinus rhythm Probable left atrial enlargement Low voltage, precordial leads RSR' in V1 or V2, probably normal variant Nonspecific T abnormalities, anterior leads QT calc normal No significant change since last tracing Confirmed by Melene Plan [phone]) on 07/10/2021 12:25:48 PM  Radiology DG Chest Port 1 View  Result Date: 07/10/2021 CLINICAL DATA:  Shortness of breath. EXAM: PORTABLE CHEST 1 VIEW COMPARISON:  April 07, 2021. FINDINGS: Stable cardiomegaly. Increased bilateral perihilar and basilar opacities are noted concerning for edema or possibly pneumonia. Small bilateral pleural effusions are noted. Bony thorax is unremarkable. IMPRESSION: Increased bilateral lung opacities are noted concerning for worsening edema or pneumonia, with small bilateral pleural effusions. Electronically Signed   By: Lupita Raider M.D.   On: 07/10/2021 12:20    Procedures Procedures   Medications Ordered in ED Medications  nitroGLYCERIN  (NITROSTAT) SL tablet 0.4 mg (has no administration in time range)  nitroGLYCERIN 50 mg in dextrose 5 % 250 mL (0.2 mg/mL) infusion (65 mcg/min Intravenous Rate/Dose Change 07/10/21 1527)  ipratropium-albuterol (DUONEB) 0.5-2.5 (3) MG/3ML nebulizer solution 3 mL (3 mLs Nebulization Given 07/10/21 1111)  methylPREDNISolone sodium succinate (SOLU-MEDROL) 125 mg/2 mL injection 125 mg (125 mg Intravenous Given 07/10/21 1105)  enalaprilat (VASOTEC) injection 1.25 mg (1.25 mg Intravenous Given 07/10/21 1209)  furosemide (LASIX) injection 40 mg (40 mg Intravenous Given 07/10/21 1252)  LORazepam (ATIVAN) injection 0.5 mg (0.5 mg Intravenous Given 07/10/21 1445)    ED Course  I have reviewed the triage vital signs and the nursing notes.  Pertinent labs & imaging results that were available during my care of the patient were reviewed by me and considered in my medical decision making (see chart for details).    MDM Rules/Calculators/A&P                           60 yo M with a chief complaints of cough and shortness of breath.  Started acutely this morning.  By history most likely the patient has acute pulmonary edema.  Has a history of heart failure listed in his chart and is actually on digoxin.  We will obtain a laboratory evaluation chest x-ray give Lasix.  He also has a history of COPD on record review and requires 4 L oxygen at baseline.  We will give 3 duo nebs back-to-back and steroids reassess.  Patient's VBG with hypercarbia.  Will start on BiPAP.  Chest x-ray with likely fluid overload we will give a dose of Lasix.  Patient with some improvement clinically on BiPAP.  Some difficulty getting BiPAP started with his tracheostomy site.  Has had some significant urinary output with Lasix.  Blood pressure still remains somewhat elevated will start on nitro infusion.  CRITICAL CARE Performed by: Rae Roam   Total critical care time: 80 minutes  Critical care time was exclusive of  separately billable procedures and treating other patients.  Critical care was necessary to treat or prevent imminent or life-threatening deterioration.  Critical care was time spent personally by me on the following activities: development of treatment plan with patient and/or surrogate as well as nursing, discussions with consultants, evaluation of patient's response to treatment, examination of patient, obtaining history from patient or surrogate, ordering and performing treatments and interventions, ordering and review of laboratory studies, ordering and review of radiographic studies, pulse oximetry and re-evaluation of patient's condition.   The patients results and plan were reviewed and discussed.   Any x-rays performed were independently reviewed by myself.   Differential diagnosis were considered with the presenting HPI.  Medications  nitroGLYCERIN (NITROSTAT) SL tablet 0.4 mg (has no administration in time range)  nitroGLYCERIN 50 mg in dextrose 5 % 250 mL (0.2 mg/mL) infusion (65 mcg/min Intravenous Rate/Dose Change 07/10/21 1527)  ipratropium-albuterol (DUONEB) 0.5-2.5 (3) MG/3ML nebulizer solution 3 mL (3 mLs Nebulization Given 07/10/21 1111)  methylPREDNISolone sodium succinate (SOLU-MEDROL) 125 mg/2 mL injection 125 mg (125 mg Intravenous Given 07/10/21 1105)  enalaprilat (VASOTEC) injection 1.25 mg (1.25 mg Intravenous Given 07/10/21 1209)  furosemide (LASIX) injection 40 mg (40 mg Intravenous Given 07/10/21 1252)  LORazepam (ATIVAN) injection 0.5 mg (0.5 mg Intravenous Given 07/10/21 1445)    Vitals:   07/10/21 1422 07/10/21 1430 07/10/21 1451 07/10/21 1526  BP: (!) 185/120 (!) 171/151 (!) 196/120 (!) 182/122  Pulse: 83 83 82 84  Resp: Temp:  TempSrc:      SpO2: 95% 92% 97% 96%  Weight:      Height:        Final diagnoses:  Acute pulmonary edema (HCC)  Acute on chronic respiratory failure with hypoxia and hypercapnia (HCC)    Admission/  observation were discussed with the admitting physician, patient and/or family and they are comfortable with the plan.   Final Clinical Impression(s) / ED Diagnoses Final diagnoses:  Acute pulmonary edema (HCC)  Acute on chronic respiratory failure with hypoxia and hypercapnia [facility])    Rx / DC Orders ED Discharge Orders     None        Melene Plan, DO 07/10/21 1528

## 2021-07-10 NOTE — Progress Notes (Signed)
RT attempted multiple times to dress trach stoma for appropriate bipap use. Patient has large leak on bipap with trach stoma covered and dressed. MD made aware. RT will monitor.

## 2021-07-10 NOTE — ED Notes (Signed)
Pt in bed, RT has placed an occlusive dressing on trach stoma, pt placed on bipap.  Pt voided, cleaned bedding and gown.

## 2021-07-10 NOTE — ED Notes (Signed)
Pt in bed with eyes closed, pt has male pure wick in place, pt has bipap in place, pt on cardiac and O2 sat monitor satting upper 90s on bipap

## 2021-07-10 NOTE — ED Notes (Signed)
Pt in bed, pt arouses to verbal stim, pt denies chest pain

## 2021-07-11 ENCOUNTER — Inpatient Hospital Stay (HOSPITAL_COMMUNITY): Payer: Medicaid Other

## 2021-07-11 ENCOUNTER — Inpatient Hospital Stay (HOSPITAL_BASED_OUTPATIENT_CLINIC_OR_DEPARTMENT_OTHER): Payer: Medicaid Other

## 2021-07-11 DIAGNOSIS — J9621 Acute and chronic respiratory failure with hypoxia: Secondary | ICD-10-CM | POA: Diagnosis not present

## 2021-07-11 DIAGNOSIS — M7989 Other specified soft tissue disorders: Secondary | ICD-10-CM | POA: Diagnosis not present

## 2021-07-11 DIAGNOSIS — I5023 Acute on chronic systolic (congestive) heart failure: Secondary | ICD-10-CM | POA: Diagnosis not present

## 2021-07-11 LAB — BASIC METABOLIC PANEL
Anion gap: 8 (ref 5–15)
Anion gap: 9 (ref 5–15)
BUN: 10 mg/dL (ref 6–20)
BUN: 13 mg/dL (ref 6–20)
CO2: 34 mmol/L — ABNORMAL HIGH (ref 22–32)
CO2: 32 mmol/L (ref 22–32)
Calcium: 8.2 mg/dL — ABNORMAL LOW (ref 8.9–10.3)
Calcium: 7.9 mg/dL — ABNORMAL LOW (ref 8.9–10.3)
Chloride: 98 mmol/L (ref 98–111)
Chloride: 95 mmol/L — ABNORMAL LOW (ref 98–111)
Creatinine, Ser: 1.15 mg/dL (ref 0.61–1.24)
Creatinine, Ser: 1.3 mg/dL — ABNORMAL HIGH (ref 0.61–1.24)
GFR, Estimated: >60 mL/min (ref >60)
GFR, Estimated: >60 mL/min (ref >60)
Glucose, Bld: 129 mg/dL — ABNORMAL HIGH (ref 70–99)
Glucose, Bld: 162 mg/dL — ABNORMAL HIGH (ref 70–99)
Potassium: 3.6 mmol/L (ref 3.5–5.1)
Potassium: 4.1 mmol/L (ref 3.5–5.1)
Sodium: 140 mmol/L (ref 135–145)
Sodium: 136 mmol/L (ref 135–145)

## 2021-07-11 LAB — MAGNESIUM: Magnesium: 1.5 mg/dL — ABNORMAL LOW (ref 1.7–2.4)

## 2021-07-11 LAB — ECHOCARDIOGRAM COMPLETE
Area-P 1/2: 3.91 cm2
Calc EF: 51.9 %
Height: 69 in
S' Lateral: 3.8 cm
Single Plane A2C EF: 50.3 %
Single Plane A4C EF: 46.4 %
Weight: 3600 oz

## 2021-07-11 LAB — CBC
HCT: 55.7 % — ABNORMAL HIGH (ref 39.0–52.0)
Hemoglobin: 16.6 g/dL (ref 13.0–17.0)
MCH: 26.8 pg (ref 26.0–34.0)
MCHC: 29.8 g/dL — ABNORMAL LOW (ref 30.0–36.0)
MCV: 90 fL (ref 80.0–100.0)
Platelets: 145 10*3/uL — ABNORMAL LOW (ref 150–400)
RBC: 6.19 MIL/uL — ABNORMAL HIGH (ref 4.22–5.81)
RDW: 20.6 % — ABNORMAL HIGH (ref 11.5–15.5)
WBC: 6.6 10*3/uL (ref 4.0–10.5)
nRBC: 0.6 % — ABNORMAL HIGH (ref 0.0–0.2)

## 2021-07-11 LAB — HIV ANTIBODY (ROUTINE TESTING W REFLEX): HIV Screen 4th Generation wRfx: NONREACTIVE

## 2021-07-11 LAB — GLUCOSE, CAPILLARY
Glucose-Capillary: 117 mg/dL — ABNORMAL HIGH (ref 70–99)
Glucose-Capillary: 169 mg/dL — ABNORMAL HIGH (ref 70–99)
Glucose-Capillary: 150 mg/dL — ABNORMAL HIGH (ref 70–99)

## 2021-07-11 LAB — PATHOLOGIST SMEAR REVIEW

## 2021-07-11 LAB — PROCALCITONIN: Procalcitonin: <0.1 ng/mL

## 2021-07-11 LAB — CBG MONITORING, ED: Glucose-Capillary: 129 mg/dL — ABNORMAL HIGH (ref 70–99)

## 2021-07-11 LAB — MRSA NEXT GEN BY PCR, NASAL: MRSA by PCR Next Gen: NOT DETECTED

## 2021-07-11 MED ORDER — HYDRALAZINE HCL 50 MG PO TABS
50.0000 mg | ORAL_TABLET | Freq: Three times a day (TID) | ORAL | Status: DC
  Administered 2021-07-11 – 2021-07-18 (×20): 50 mg via ORAL
  Filled 2021-07-11 (×20): qty 1

## 2021-07-11 MED ORDER — ENOXAPARIN SODIUM 40 MG/0.4ML IJ SOSY
40.0000 mg | PREFILLED_SYRINGE | INTRAMUSCULAR | Status: DC
  Administered 2021-07-11: 40 mg via SUBCUTANEOUS
  Filled 2021-07-11: qty 0.4

## 2021-07-11 MED ORDER — ISOSORBIDE MONONITRATE ER 30 MG PO TB24
30.0000 mg | ORAL_TABLET | Freq: Every day | ORAL | Status: DC
  Administered 2021-07-11 – 2021-07-18 (×8): 30 mg via ORAL
  Filled 2021-07-11 (×8): qty 1

## 2021-07-11 MED ORDER — AZITHROMYCIN 250 MG PO TABS: 500.0000 mg | ORAL_TABLET | Freq: Every day | ORAL | Status: DC

## 2021-07-11 MED ORDER — CLONIDINE HCL 0.1 MG PO TABS
0.1000 mg | ORAL_TABLET | Freq: Three times a day (TID) | ORAL | Status: DC
  Administered 2021-07-11 – 2021-07-18 (×21): 0.1 mg via ORAL
  Filled 2021-07-11 (×21): qty 1

## 2021-07-11 MED ORDER — DOXYCYCLINE HYCLATE 100 MG PO TABS
100.0000 mg | ORAL_TABLET | Freq: Two times a day (BID) | ORAL | Status: AC
  Administered 2021-07-11 – 2021-07-15 (×9): 100 mg via ORAL
  Filled 2021-07-11 (×9): qty 1

## 2021-07-11 MED ORDER — FUROSEMIDE 10 MG/ML IJ SOLN
80.0000 mg | Freq: Two times a day (BID) | INTRAMUSCULAR | Status: DC
  Administered 2021-07-11 – 2021-07-15 (×8): 80 mg via INTRAVENOUS
  Filled 2021-07-11 (×8): qty 8

## 2021-07-11 MED ORDER — SODIUM CHLORIDE 0.9 % IV SOLN
2.0000 g | INTRAVENOUS | Status: AC
  Administered 2021-07-11 – 2021-07-15 (×5): 2 g via INTRAVENOUS
  Filled 2021-07-11 (×5): qty 20

## 2021-07-11 MED ORDER — APIXABAN 5 MG PO TABS
5.0000 mg | ORAL_TABLET | Freq: Two times a day (BID) | ORAL | Status: DC
  Administered 2021-07-11 – 2021-07-18 (×14): 5 mg via ORAL
  Filled 2021-07-11 (×14): qty 1

## 2021-07-11 NOTE — ED Notes (Signed)
Patient transported to vascular. 

## 2021-07-11 NOTE — ED Notes (Signed)
Pt being fed at this time.

## 2021-07-11 NOTE — Progress Notes (Signed)
  Echocardiogram 2D Echocardiogram has been performed.  Tommy Mathews 07/11/2021, 1:37 PM

## 2021-07-11 NOTE — Progress Notes (Signed)
BLE venous duplex has been completed.  Results can be found under chart review under CV PROC. 07/11/2021 12:24 PM Klaus Casteneda RVT, RDMS

## 2021-07-11 NOTE — ED Notes (Signed)
Called 2W to give report. Charge RN stated she is putting in the info for purple man now.

## 2021-07-11 NOTE — ED Notes (Signed)
Gown changed

## 2021-07-11 NOTE — Progress Notes (Signed)
[facility] Progress Note  Patient: Tommy Mathews    ENI:[id number]  DOA: 07/10/2021     Date of Service: the patient was seen and examined on 07/11/2021  Brief hospital course: PMH of traumatic brain injury, prior tracheostomy, HTN, HLD, PAF, Type 2 DM, COPD, Chronic resp failure 2-4 lpm, bipolar disorder.  Was hypotensive on admission respiratory distress. Treated with BiPAP and nitroglycerin drip on admission. Continues to have severely hypoxic on 8 LPM. Currently plan is continue IV diuresis and further work-up.  Subjective: Answers questions yes and no.  Denies any acute complaint.  Denies any shortness of breath as well. Has cough with mucus production and denies any cough.  Denies any chest pain.  Suspect the patient is poor historian.  Assessment and Plan: 1.  Acute on chronic hypoxic respiratory failure. Likely combination of acute on chronic diastolic CHF as well as hypertensive emergency and possible community-acquired pneumonia. Treated with BiPAP.  Now BiPAP as needed. Currently on oxygen on 8 LPM. Tachypneic in respiratory distress.  Chest x-ray and CT scan shows evidence of multifocal pneumonia.  Also atelectasis. Currently on IV antibiotics.  Will check procalcitonin level. Treated with IV Lasix.  Was on 40 twice daily increased to 80 twice daily. Echocardiogram shows diastolic dysfunction. At risk for further worsening distress /n/.  Progressive care unit.  2.  Hypertensive emergency with acute on chronic diastolic CHF Blood pressure 200 systolic on admission. Was started on nitroglycerin drip. Currently on clonidine 0.1 mg 3 times daily, Imdur, hydralazine and Lasix. Monitor.  3.  History of DVT PE. PAF There is a documented history of PAF. Patient was possibly taken off of anticoagulation. 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 Also on digoxin likely secondary to PAF.  4.  Mood  disorder. Documented history of schizophrenia as well as bipolar disorder Will require psychiatry consultation for further clarification of schizophrenia Currently not in a condition to properly answer. Continue home regimen.  5.  Type 2 diabetes mellitus Continue sliding scale insulin and Levemir.  6.  Obesity Placing the patient at high risk for poor outcome. Body mass index is 33.23 kg/m.   7.  History of tracheostomy  Still has a stoma. Has mucus coming out of the stoma. Respiratory consulted.  DVT Prophylaxis:    apixaban (ELIQUIS) tablet 5 mg    Pt is Full code.  Communication: no family was present at bedside, at the time of interview.   Data Reviewed: I have personally reviewed and interpreted daily labs, tele strips, imaging. Dig level undetectable  Physical Exam:  General: Appear in mild distress, no Rash; Oral Mucosa Clear, moist. no Abnormal Neck Mass Or lumps, Conjunctiva normal  Cardiovascular: S1 and S2 Present, no Murmur, Respiratory: increased respiratory effort, Bilateral Air entry present and bilateral  Crackles, no wheezes Abdomen: Bowel Sound present, Soft and no tenderness Extremities: trace Pedal edema Neurology: alert and oriented to time, place, and person affect appropriate. no new focal deficit Gait not checked due to patient safety concerns  Vitals:   07/11/21 1745 07/11/21 1800 07/11/21 1952 07/11/21 1958  BP:  (!) 149/96    Pulse: 91 88  92  Resp: 17 17 13  (!) 23  Temp: (!) 97.1 F (36.2 C)  98.7 F (37.1 C)   TempSrc: Oral  Oral   SpO2: 92% 95%  92%  Weight:      Height:        Disposition:  Status is: Inpatient  Remains inpatient appropriate because:Hemodynamically unstable  Time spent: 35 minutes. I reviewed all nursing notes, pharmacy notes, vitals, pertinent old records. I have discussed plan of care as described above with RN.  Author: Lynden Oxford, MD [facility] 07/11/2021 8:22 PM  To reach On-call, see  care teams to locate the attending and reach out via [email or web address]. Between 7PM-7AM, please contact night-coverage If you still have difficulty reaching the attending provider, please page the [facility] (Director on Call) for [facility] on amion for assistance.

## 2021-07-12 ENCOUNTER — Inpatient Hospital Stay (HOSPITAL_COMMUNITY): Payer: Medicaid Other

## 2021-07-12 DIAGNOSIS — E119 Type 2 diabetes mellitus without complications: Secondary | ICD-10-CM | POA: Diagnosis not present

## 2021-07-12 DIAGNOSIS — J431 Panlobular emphysema: Secondary | ICD-10-CM

## 2021-07-12 DIAGNOSIS — I82401 Acute embolism and thrombosis of unspecified deep veins of right lower extremity: Secondary | ICD-10-CM | POA: Diagnosis not present

## 2021-07-12 DIAGNOSIS — Z794 Long term (current) use of insulin: Secondary | ICD-10-CM

## 2021-07-12 DIAGNOSIS — I5022 Chronic systolic (congestive) heart failure: Secondary | ICD-10-CM

## 2021-07-12 DIAGNOSIS — J9621 Acute and chronic respiratory failure with hypoxia: Secondary | ICD-10-CM

## 2021-07-12 DIAGNOSIS — R0902 Hypoxemia: Secondary | ICD-10-CM

## 2021-07-12 DIAGNOSIS — J810 Acute pulmonary edema: Secondary | ICD-10-CM

## 2021-07-12 DIAGNOSIS — J9622 Acute and chronic respiratory failure with hypercapnia: Secondary | ICD-10-CM

## 2021-07-12 DIAGNOSIS — F201 Disorganized schizophrenia: Secondary | ICD-10-CM

## 2021-07-12 LAB — CBC WITH DIFFERENTIAL/PLATELET
Abs Immature Granulocytes: 0.03 10*3/uL (ref 0.00–0.07)
Basophils Absolute: 0 10*3/uL (ref 0.0–0.1)
Basophils Relative: 0 %
Eosinophils Absolute: 0.1 10*3/uL (ref 0.0–0.5)
Eosinophils Relative: 1 %
HCT: 57.9 % — ABNORMAL HIGH (ref 39.0–52.0)
Hemoglobin: 17.5 g/dL — ABNORMAL HIGH (ref 13.0–17.0)
Immature Granulocytes: 0 %
Lymphocytes Relative: 25 %
Lymphs Abs: 1.7 10*3/uL (ref 0.7–4.0)
MCH: 27 pg (ref 26.0–34.0)
MCHC: 30.2 g/dL (ref 30.0–36.0)
MCV: 89.4 fL (ref 80.0–100.0)
Monocytes Absolute: 0.9 10*3/uL (ref 0.1–1.0)
Monocytes Relative: 13 %
Neutro Abs: 4.1 10*3/uL (ref 1.7–7.7)
Neutrophils Relative %: 61 %
Platelets: 157 10*3/uL (ref 150–400)
RBC: 6.48 MIL/uL — ABNORMAL HIGH (ref 4.22–5.81)
RDW: 20.5 % — ABNORMAL HIGH (ref 11.5–15.5)
WBC: 6.8 10*3/uL (ref 4.0–10.5)
nRBC: 0 % (ref 0.0–0.2)

## 2021-07-12 LAB — BASIC METABOLIC PANEL
Anion gap: 8 (ref 5–15)
BUN: 15 mg/dL (ref 6–20)
CO2: 38 mmol/L — ABNORMAL HIGH (ref 22–32)
Calcium: 8 mg/dL — ABNORMAL LOW (ref 8.9–10.3)
Chloride: 90 mmol/L — ABNORMAL LOW (ref 98–111)
Creatinine, Ser: 1.24 mg/dL (ref 0.61–1.24)
GFR, Estimated: >60 mL/min (ref >60)
Glucose, Bld: 103 mg/dL — ABNORMAL HIGH (ref 70–99)
Potassium: 5 mmol/L (ref 3.5–5.1)
Sodium: 136 mmol/L (ref 135–145)

## 2021-07-12 LAB — BLOOD GAS, ARTERIAL
Acid-Base Excess: 10.8 mmol/L — ABNORMAL HIGH (ref 0.0–2.0)
Bicarbonate: 35.8 mmol/L — ABNORMAL HIGH (ref 20.0–28.0)
FIO2: 100
O2 Saturation: 96.2 %
Patient temperature: 37.1
pCO2 arterial: 57.4 mmHg — ABNORMAL HIGH (ref 32.0–48.0)
pH, Arterial: 7.412 (ref 7.350–7.450)
pO2, Arterial: 95.8 mmHg (ref 83.0–108.0)

## 2021-07-12 LAB — GLUCOSE, CAPILLARY
Glucose-Capillary: 134 mg/dL — ABNORMAL HIGH (ref 70–99)
Glucose-Capillary: 160 mg/dL — ABNORMAL HIGH (ref 70–99)
Glucose-Capillary: 119 mg/dL — ABNORMAL HIGH (ref 70–99)
Glucose-Capillary: 181 mg/dL — ABNORMAL HIGH (ref 70–99)

## 2021-07-12 LAB — PROCALCITONIN: Procalcitonin: <0.1 ng/mL

## 2021-07-12 LAB — MAGNESIUM: Magnesium: 1.7 mg/dL (ref 1.7–2.4)

## 2021-07-12 MED ORDER — IPRATROPIUM BROMIDE 0.02 % IN SOLN
0.5000 mg | Freq: Two times a day (BID) | RESPIRATORY_TRACT | Status: DC
  Administered 2021-07-12 – 2021-07-16 (×9): 0.5 mg via RESPIRATORY_TRACT
  Filled 2021-07-12 (×9): qty 2.5

## 2021-07-12 MED ORDER — IPRATROPIUM BROMIDE 0.02 % IN SOLN
0.5000 mg | Freq: Three times a day (TID) | RESPIRATORY_TRACT | Status: DC
  Administered 2021-07-12: 0.5 mg via RESPIRATORY_TRACT
  Filled 2021-07-12: qty 2.5

## 2021-07-12 MED ORDER — MAGNESIUM SULFATE IN D5W 1-5 GM/100ML-% IV SOLN
1.0000 g | Freq: Once | INTRAVENOUS | Status: AC
  Administered 2021-07-12: 1 g via INTRAVENOUS
  Filled 2021-07-12: qty 100

## 2021-07-12 NOTE — Consult Note (Signed)
NAME:  Tommy Mathews, MRN:  [id number], DOB:  12-13-1960, LOS: 2 ADMISSION DATE:  07/10/2021 CONSULTATION DATE:  07/12/2021 REFERRING MD:  Loney Loh - [facility] CHIEF COMPLAINT:  Hypoxia  History of Present Illness:  Tommy Mathews is seen in consultation at the request of Dr. Loney Loh [facility]) for recommendations on further evaluation and management of progressive hypoxia in the setting of acute-on-chronic respiratory failure.  60 year old man who initially presented to [facility] ED 10/10 from correctional facility for sudden onset of SOB. PMHx significant for HTN, HLD, paroxysmal AF, T2DM (insulin-dependent), schizophrenia/bipolar disorder, COPD with acute-on-chronic respiratory failure (requiring 2-4L O2 therapy at baseline) and TBI with need for tracheostomy (s/p decannulation 07/07/2021).   Patient presented to [facility] ED from his correctional facility after experiencing increased SOB, dyspnea. Arrived on 15L O2, s/p Lasix administration with good diuresis He was transitioned to BiPAP. Per patient, he utilizes 2-4L HOT but does not wear this consistently. Additionally, patient was hypertensive on admission with BP 215/117 (per chart review). Reported cough and frothy sputum on admission as well as LE edema. CXR on admission showed bilateral opacities c/f pulmonary edema vs. PNA with small bilateral pleural effusions. Given ongoing dyspnea/hypoxia, LE dopplers were completed (negative for DVT) as well as CTA Chest, which was negative for PE but demonstrated findings consistent with pulmonary HTN and pulmonary edema as well as LLL opacity. Lasix BID, bronchodilators and empiric coverage for PNA was intiated.  10/12PM, patient was noted to have decreased O2 saturations to 70s despite escalation of O2 needs, though without respiratory distress. PCCM consulted for worsening hypoxia and increasing oxygen requirement.  Pertinent Medical History:   Past Medical History:  Diagnosis Date   Bipolar 1 disorder (HCC)    COPD  (chronic obstructive pulmonary disease) (HCC)    Diabetes (HCC)    GERD (gastroesophageal reflux disease)    HLD (hyperlipidemia)    HTN (hypertension)    Schizophrenia (HCC) 01/31/2021   TBI S/p tracheostomy (decannulated 07/2021)  Significant Hospital Events: Including procedures, antibiotic start and stop dates in addition to other pertinent events   10/10 Admitted to [facility] 10/12 Worsening hypoxia despite escalation of supplemental O2 therapy, PCCM consulted for evaluation/management recs  Interim History / Subjective:  PCCM consulted for progressive hypoxia, AOC respiratory failure  Objective:  Blood pressure (!) 141/91, pulse 83, temperature (!) 97.4 F (36.3 C), temperature source Axillary, resp. rate 10, height 5\' 9"  (1.753 m), weight 106.4 kg, SpO2 91 %.        Intake/Output Summary (Last 24 hours) at 07/12/2021 2042 Last data filed at 07/12/2021 1843 Gross per 24 hour  Intake 1160 ml  Output 6700 ml  Net -5540 ml   Filed Weights   07/10/21 1115 07/12/21 0332  Weight: 102.1 kg 106.4 kg   Physical Examination: General: Chronically ill-appearing middle-aged man in NAD. Pleasant and conversant. HEENT: [place]/AT, anicteric sclera, PERRL, dry mucous membranes. Prior tracheostomy stoma with small amount of thick, clear-yellow secretions, no bleeding. Neuro: Awake, oriented x 4. Responds to verbal stimuli. Following commands consistently. Moves all 4 extremities spontaneously. CV: RRR, no m/g/r. PULM: Breathing even and unlabored on NRB (10L). Lung fields with bibasilar crackles L > R. GI: Soft, nontender, nondistended. Normoactive bowel sounds. Extremities: Bilateral symmetric 1+ pitting LE edema noted. Skin: Warm/dry, seborrheic-appearing dermatitis around face/scalp.  Resolved Hospital Problem List:    Assessment & Plan:  Mr. Tommy Mathews is seen in consultation at the request of Dr. Mardella Layman [facility]) for recommendations on further evaluation and management of progressive hypoxia  in the setting of acute-on-chronic respiratory failure after patient was noted to have decreased O2 saturations to 70s despite escalation of O2 therapy.  Acute-on-chronic hypoxemic respiratory failure HFpEF (EF 55% on Echo 10/11) Pulmonary edema vs. PNA History of COPD Patient presented to [facility] ED 10/10 from correctional facility after acute onset of SOB. Working diagnosis at the time of admission was flash pulmonary edema, given appearance of infiltrates on CXR, frothy sputum and hypertension. Differential initially included PE for which LE dopplers and CTA chest were completed (dopplers demonstrating chronic proximal femoral DVT, no acute thrombus; CTA chest negative for PE but demonstrating LLL opacity and pulmonary edema as well as enlarged pulmonary artery). Echo showed EF 55% with grade I diastolic dysfunction. Ongoing working diagnoses are pulmonary edema/CHF with possible superimposed PNA.  - CXR from this evening reviewed, opacities/pulmonary edema improved s/p significant diuresis, no evidence of significant mucous plugging based on lung aeration - Continue supplemental O2 support - Wean O2 as able for sat > 88% - Continue diuresis, monitoring I&Os - Continue bronchodilators - Pulmonary hygiene - NTS per RT for tracheal secretions - Chest PT - F/u ABG 10/12PM - Daily CXR  Best Practice: (right click and "Reselect all SmartList Selections" daily)   Per Primary Team  Labs:  CBC: Recent Labs  Lab 07/10/21 1036 07/10/21 1209 07/11/21 0508 07/12/21 0218  WBC 4.5  --  6.6 6.8  NEUTROABS 2.7  --   --  4.1  HGB 17.3* 20.1* 16.6 17.5*  HCT 58.6* 59.0* 55.7* 57.9*  MCV 91.4  --  90.0 89.4  PLT 146*  --  145* 157   Basic Metabolic Panel: Recent Labs  Lab 07/10/21 1036 07/10/21 1209 07/11/21 0508 07/11/21 1817 07/12/21 0218  NA 141 141 140 136 136  K 3.9 5.2* 3.6 4.1 5.0  CL 103  --  98 95* 90*  CO2 30  --  34* 32 38*  GLUCOSE 167*  --  129* 162* 103*  BUN 5*  --  10  13 15   CREATININE 1.03  --  1.15 1.30* 1.24  CALCIUM 8.5*  --  8.2* 7.9* 8.0*  MG  --   --   --  1.5* 1.7   GFR: Estimated Creatinine Clearance: 77.1 mL/min (by C-G formula based on SCr of 1.24 mg/dL). Recent Labs  Lab 07/10/21 1036 07/11/21 0508 07/11/21 1722 07/12/21 0218  PROCALCITON  --   --  <0.10 <0.10  WBC 4.5 6.6  --  6.8   Liver Function Tests: Recent Labs  Lab 07/10/21 1036  AST 19  ALT 15  ALKPHOS 58  BILITOT 1.0  PROT 6.8  ALBUMIN 2.8*   No results for input(s): LIPASE, AMYLASE in the last 168 hours. No results for input(s): AMMONIA in the last 168 hours.  ABG:    Component Value Date/Time   PHART 7.394 12/14/2019 0327   PCO2ART 50.4 (H) 12/14/2019 0327   PO2ART 46.0 (L) 12/14/2019 0327   HCO3 38.1 (H) 07/10/2021 1209   TCO2 40 (H) 07/10/2021 1209   ACIDBASEDEF 0.3 01/13/2018 0419   O2SAT 99.0 07/10/2021 1209    Coagulation Profile: No results for input(s): INR, PROTIME in the last 168 hours.  Cardiac Enzymes: No results for input(s): CKTOTAL, CKMB, CKMBINDEX, TROPONINI in the last 168 hours.  HbA1C: Hgb A1c MFr Bld  Date/Time Value Ref Range Status  02/01/2021 12:47 PM 7.0 (H) 4.8 - 5.6 % Final    Comment:    (NOTE) Pre diabetes:  5.7%-6.4%  Diabetes:              >6.4%  Glycemic control for   <7.0% adults with diabetes   12/15/2019 01:22 AM 6.9 (H) 4.8 - 5.6 % Final    Comment:    (NOTE) Pre diabetes:          5.7%-6.4% Diabetes:              >6.4% Glycemic control for   <7.0% adults with diabetes    CBG: Recent Labs  Lab 07/11/21 2057 07/12/21 0755 07/12/21 1156 07/12/21 1615 07/12/21 2034  GLUCAP 150* 134* 160* 119* 181*   Review of Systems:   Review of systems completed with pertinent positives/negatives outlined in above HPI.  Past Medical History:  He,  has a past medical history of Bipolar 1 disorder (HCC), COPD (chronic obstructive pulmonary disease) (HCC), Diabetes (HCC), GERD (gastroesophageal reflux  disease), HLD (hyperlipidemia), HTN (hypertension), and Schizophrenia (HCC) (01/31/2021).   Surgical History:   Past Surgical History:  Procedure Laterality Date   IR GASTROSTOMY TUBE REMOVAL  08/15/2018   PEG PLACEMENT N/A 01/21/2018   Procedure: PERCUTANEOUS ENDOSCOPIC GASTROSTOMY (PEG) PLACEMENT;  Surgeon: Wyline Mood, MD;  Location: [facility] ENDOSCOPY;  Service: Gastroenterology;  Laterality: N/A;   TRACHEOSTOMY TUBE PLACEMENT N/A 01/23/2018   Procedure: TRACHEOSTOMY;  Surgeon: Bud Face, MD;  Location: [facility] ORS;  Service: ENT;  Laterality: N/A;    Social History:   reports that he has been smoking cigarettes. He started smoking about 22 years ago. He has a 40.00 pack-year smoking history. He has never used smokeless tobacco. He reports current alcohol use. He reports that he does not use drugs.   Family History:  His family history is not on file.   Allergies: No Known Allergies   Home Medications: Prior to Admission medications   Medication Sig Start Date End Date Taking? Authorizing Provider  cloNIDine (CATAPRES) 0.1 MG tablet Take 0.1 mg by mouth 2 (two) times daily.   Yes [provider]  digoxin (LANOXIN) 0.25 MG tablet Take 1 tablet (0.25 mg total) by mouth daily. 09/02/19  Yes Christian, Rylee, MD  divalproex (DEPAKOTE) 250 MG DR tablet Take 3 tablets (750 mg total) by mouth 2 (two) times daily. 04/17/21 09/09/21 Yes Russella Dar, NP  FLUoxetine (PROZAC) 20 MG capsule Take 1 capsule (20 mg total) by mouth daily. 09/02/19  Yes Christian, Rylee, MD  hydrALAZINE (APRESOLINE) 25 MG tablet Take 1 tablet (25 mg total) by mouth every 8 (eight) hours. 04/17/21 09/09/21 Yes Arrien, York Ram, MD  insulin glargine (LANTUS) 100 UNIT/ML injection Inject 0.1 mLs (10 Units total) into the skin daily. Patient taking differently: Inject 10 Units into the skin at bedtime. 04/18/21  Yes Russella Dar, NP  insulin regular (NOVOLIN R) 100 units/mL injection Inject 2-12 Units  into the skin See admin instructions. Three times daily per sliding scale   Yes [provider]  QUEtiapine (SEROQUEL) 200 MG tablet Take 1 tablet (200 mg total) by mouth at bedtime. 09/01/19  Yes Christian, Rylee, MD  QUEtiapine (SEROQUEL) 50 MG tablet Take 1 tablet (50 mg total) by mouth in the morning. 04/17/21 09/09/21 Yes Russella Dar, NP  apixaban (ELIQUIS) 5 MG TABS tablet Take 2 tablets twice daily through 04/19/21 then start taking 1 tablet twice daily on 04/20/21 Patient not taking: No sig reported 04/17/21   Russella Dar, NP  clonazePAM (KLONOPIN) 0.5 MG tablet Take 0.5 tablets (0.25 mg total) by mouth 2 (  two) times daily. Patient not taking: No sig reported 03/30/21   Arrien, York Ram, MD    Critical care time: 30 minutes   Tim Lair, PA-C [facility] 07/12/21 8:42 PM  Please see [email or web address] for pager details.  From 7A-7P if no response, please call [phone] After hours, please call ELink [phone]

## 2021-07-12 NOTE — Plan of Care (Signed)

## 2021-07-12 NOTE — Progress Notes (Signed)
[facility]  PROGRESS NOTE  Tommy Mathews GNF:[id number] DOB: 12/21/1960 DOA: 07/10/2021 PCP: Georgann Housekeeper, MD   Brief HPI:   60 year old male with past medical history of traumatic brain injury, prior tracheostomy, hypertension, hyperlipidemia, PAF, diabetes mellitus type 2, COPD, chronic respiratory failure on oxygen 2 to 4 L/min, bipolar disorder was hypotensive on admission and in respiratory distress.  Started on BiPAP and nitroglycerin drip on admission.  He is diuresing well with IV Lasix.    Subjective   Patient seen and examined, denies any shortness of breath.  No chest pain.   Assessment/Plan:    Acute on chronic hypoxemic respiratory failure -Likely combination of acute on chronic diastolic CHF, hypertensive emergency, community-acquired pneumonia -Treated with BiPAP -Currently requiring oxygen folders per minute -He was tachypneic on admission, chest x-ray and CT scan showed multifocal pneumonia and also atelectasis -Continue IV antibiotics; doxycycline -Also treated with Lasix 80 mg IV twice daily -Echocardiogram showed diastolic dysfunction   Hypertensive emergency with acute on chronic diastolic CHF -Blood pressure has improved -He was started on nitroglycerin drip; which has been discontinued -Currently on clonidine 0.1 mg 3 times daily, Imdur, hydralazine and Lasix  History of DVT/PE -Proximal 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 -Continue Eliquis 5 mg twice daily  Paroxysmal atrial fibrillation -Heart rate controlled -He has documented history of PAF -Continue digoxin -Continue Eliquis as above  Mood disorder -History of schizophrenia/bipolar disorder -Continue Seroquel  Diabetes mellitus type 2 -Continue sliding scale insulin with NovoLog, Levemir  History of tracheostomy -Still has stoma -Respiratory therapy consulted        Scheduled medications:    apixaban  5 mg Oral BID   cloNIDine  0.1 mg Oral TID    digoxin  0.25 mg Oral Daily   divalproex  750 mg Oral BID   doxycycline  100 mg Oral Q12H   FLUoxetine  20 mg Oral Daily   furosemide  80 mg Intravenous BID   hydrALAZINE  50 mg Oral Q8H   insulin aspart  0-5 Units Subcutaneous QHS   insulin aspart  0-9 Units Subcutaneous TID WC   insulin detemir  10 Units Subcutaneous QHS   ipratropium  0.5 mg Nebulization BID   isosorbide mononitrate  30 mg Oral Daily   QUEtiapine  200 mg Oral QHS   QUEtiapine  50 mg Oral Daily     Data Reviewed:   CBG:  Recent Labs  Lab 07/11/21 1700 07/11/21 2057 07/12/21 0755 07/12/21 1156 07/12/21 1615  GLUCAP 169* 150* 134* 160* 119*    SpO2: 91 % O2 Flow Rate (L/min): 4 L/min FiO2 (%): 50 %    Vitals:   07/12/21 1500 07/12/21 1540 07/12/21 1600 07/12/21 1700  BP:  (!) 145/94 (!) 141/91   Pulse: 85 82 81 83  Resp: Temp:   (!) 97.4 F (36.3 C)   TempSrc:   Axillary   SpO2: 91% 92% 93% 91%  Weight:      Height:         Intake/Output Summary (Last 24 hours) at 07/12/2021 1756 Last data filed at 07/12/2021 1728 Gross per 24 hour  Intake 1060 ml  Output 6750 ml  Net -5690 ml    10/10 1901 - 10/12 0700 In: 1025.1 [I.V.:925.1] Out: 4050 [Urine:4050]  Filed Weights   07/10/21 1115 07/12/21 0332  Weight: 102.1 kg 106.4 kg    Data Reviewed: Basic Metabolic Panel: Recent Labs  Lab 07/10/21 1036 07/10/21  1209 07/11/21 0508 07/11/21 1817 07/12/21 0218  NA 141 141 140 136 136  K 3.9 5.2* 3.6 4.1 5.0  CL 103  --  98 95* 90*  CO2 30  --  34* 32 38*  GLUCOSE 167*  --  129* 162* 103*  BUN 5*  --  10 13 15   CREATININE 1.03  --  1.15 1.30* 1.24  CALCIUM 8.5*  --  8.2* 7.9* 8.0*  MG  --   --   --  1.5* 1.7   Liver Function Tests: Recent Labs  Lab 07/10/21 1036  AST 19  ALT 15  ALKPHOS 58  BILITOT 1.0  PROT 6.8  ALBUMIN 2.8*   No results for input(s): LIPASE, AMYLASE in the last 168 hours. No results for input(s): AMMONIA in the last 168  hours. CBC: Recent Labs  Lab 07/10/21 1036 07/10/21 1209 07/11/21 0508 07/12/21 0218  WBC 4.5  --  6.6 6.8  NEUTROABS 2.7  --   --  4.1  HGB 17.3* 20.1* 16.6 17.5*  HCT 58.6* 59.0* 55.7* 57.9*  MCV 91.4  --  90.0 89.4  PLT 146*  --  145* 157   Cardiac Enzymes: No results for input(s): CKTOTAL, CKMB, CKMBINDEX, TROPONINI in the last 168 hours. BNP (last 3 results) Recent Labs    02/06/21 1215 03/21/21 1111 07/10/21 1050  BNP 28.6 263.8* 1,316.0*    ProBNP (last 3 results) No results for input(s): PROBNP in the last 8760 hours.  CBG: Recent Labs  Lab 07/11/21 1700 07/11/21 2057 07/12/21 0755 07/12/21 1156 07/12/21 1615  GLUCAP 169* 150* 134* 160* 119*       Radiology Reports  CT Angio Chest Pulmonary Embolism (PE) W or WO Contrast  Result Date: 07/10/2021 CLINICAL DATA:  PE suspected, high prob recent PE, taken off anticoagulation EXAM: CT ANGIOGRAPHY CHEST WITH CONTRAST TECHNIQUE: Multidetector CT imaging of the chest was performed using the standard protocol during bolus administration of intravenous contrast. Multiplanar CT image reconstructions and MIPs were obtained to evaluate the vascular anatomy. CONTRAST:  8mL OMNIPAQUE IOHEXOL 350 MG/ML SOLN COMPARISON:  CT angio chest 04/10/2021, CT angio chest 08/25/2019 FINDINGS: Cardiovascular: Satisfactory opacification of the pulmonary arteries to the segmental level. No evidence of pulmonary embolism. The main pulmonary artery is enlarged measuring of 4.1 cm. Normal heart size. No significant pericardial effusion. The thoracic aorta is normal in caliber. Mild atherosclerotic plaque of the thoracic aorta. At least left anterior descending coronary artery calcifications. Mediastinum/Nodes: No enlarged mediastinal, hilar, or axillary lymph nodes. Thyroid gland, trachea, and esophagus demonstrate no significant findings. Lungs/Pleura: Stable to slightly increased in size trace bilateral, left greater than right, pleural  effusions. Bilateral lower lobe subsegmental atelectasis. Similar-appearing to slightly more conspicuous consolidation with air bronchograms within the left lower lobe. Interlobular septal wall thickening. Upper Abdomen: Cholelithiasis.  No acute abnormality. Musculoskeletal: No chest wall abnormality. No suspicious lytic or blastic osseous lesions. No acute displaced fracture. Multilevel degenerative changes of the spine. Review of the MIP images confirms the above findings. IMPRESSION: 1. No pulmonary embolus. 2. Enlarged main pulmonary artery consistent with pulmonary hypertension. 3. Pulmonary edema with stable to slightly increased in size bilateral trace pleural effusions. 4. Slightly more conspicuous left lower lobe consolidation which may represent a combination of infection/inflammation and/or atelectasis. Underlying mass or nodule not excluded. Electronically Signed   By: 08/27/2019 M.D.   On: 07/10/2021 23:22   ECHOCARDIOGRAM COMPLETE  Result Date: 07/11/2021    ECHOCARDIOGRAM REPORT   Patient  Name:   [name] Date of Exam: 07/11/2021 Medical Rec #:  [id number]     Height:       69.0 in Accession #:    [id number]    Weight:       225.0 lb Date of Birth:  1960/10/20     BSA:          2.172 m Patient Age:    60 years      BP:           142/85 mmHg Patient Gender: M             HR:           81 bpm. Exam Location:  Inpatient Procedure: 2D Echo, Cardiac Doppler and Color Doppler Indications:    I50.40* Unspecified combined systolic (congestive) and diastolic                 (congestive) heart failure  History:        Patient has prior history of Echocardiogram examinations, most                 recent 04/11/2021. COPD; Signs/Symptoms:Shortness of Breath,                 Dyspnea and Altered Mental Status. Tracheostomy.  Sonographer:    Sheralyn Boatman RDCS Referring Phys: [id number] [facility]  Sonographer Comments: Technically difficult study due to poor echo windows. Image acquisition challenging due  to uncooperative patient. Patient to the bed handcuffs in right decubitud position. Could not follow directions. Extremely difficult arm position for this study. Patient with one IV on nitro drip. IMPRESSIONS  1. Left ventricular ejection fraction, by estimation, is 55%. The left ventricle has normal function. Left ventricular endocardial border not optimally defined to evaluate regional wall motion. There is mild concentric left ventricular hypertrophy. Left  ventricular diastolic parameters are consistent with Grade I diastolic dysfunction (impaired relaxation).  2. Right ventricular systolic function is mildly reduced. The right ventricular size is mildly enlarged. Tricuspid regurgitation signal is inadequate for assessing PA pressure.  3. A small pericardial effusion is present. The pericardial effusion is surrounding the apex. No evidence of increased pericardial pressure.  4. The mitral valve is normal in structure. No evidence of mitral valve regurgitation.  5. The aortic valve is tricuspid. Aortic valve regurgitation is not visualized. No aortic stenosis is present. Comparison(s): A prior study was performed on 04/11/21. Changes from prior study are noted. Pericardial effusion is new. FINDINGS  Left Ventricle: Left ventricular ejection fraction, by estimation, is 55%. The left ventricle has normal function. Left ventricular endocardial border not optimally defined to evaluate regional wall motion. The left ventricular internal cavity size was normal in size. There is mild concentric left ventricular hypertrophy. Left ventricular diastolic parameters are consistent with Grade I diastolic dysfunction (impaired relaxation). Right Ventricle: The right ventricular size is mildly enlarged. No increase in right ventricular wall thickness. Right ventricular systolic function is mildly reduced. Tricuspid regurgitation signal is inadequate for assessing PA pressure. Left Atrium: Left atrial size was normal in size.  Right Atrium: Right atrial size was normal in size. Pericardium: A small pericardial effusion is present. The pericardial effusion is surrounding the apex. Mitral Valve: The mitral valve is normal in structure. No evidence of mitral valve regurgitation. Tricuspid Valve: The tricuspid valve is normal in structure. Tricuspid valve regurgitation is not demonstrated. Aortic Valve: The aortic valve is tricuspid. Aortic valve regurgitation is not visualized. No aortic stenosis is  present. Pulmonic Valve: Discrepancy between color and Spectral Doppler. The pulmonic valve was not well visualized. Pulmonic valve regurgitation is not visualized. No evidence of pulmonic stenosis. Aorta: The aortic root and ascending aorta are structurally normal, with no evidence of dilitation. IAS/Shunts: The atrial septum is grossly normal.  LEFT VENTRICLE PLAX 2D LVIDd:         5.50 cm      Diastology LVIDs:         3.80 cm      LV e' medial:    5.33 cm/s LV PW:         1.20 cm      LV E/e' medial:  11.8 LV IVS:        1.30 cm      LV e' lateral:   5.44 cm/s LVOT diam:     2.20 cm      LV E/e' lateral: 11.5 LV SV:         59 LV SV Index:   27 LVOT Area:     3.80 cm  LV Volumes (MOD) LV vol d, MOD A2C: 146.0 ml LV vol d, MOD A4C: 116.0 ml LV vol s, MOD A2C: 72.6 ml LV vol s, MOD A4C: 62.2 ml LV SV MOD A2C:     73.4 ml LV SV MOD A4C:     116.0 ml LV SV MOD BP:      77.1 ml RIGHT VENTRICLE RV S prime:     6.24 cm/s TAPSE (M-mode): 1.2 cm LEFT ATRIUM           Index        RIGHT ATRIUM           Index LA diam:      3.60 cm 1.66 cm/m   RA Area:     11.00 cm LA Vol (A2C): 18.1 ml 8.33 ml/m   RA Volume:   19.10 ml  8.79 ml/m LA Vol (A4C): 26.4 ml 12.15 ml/m  AORTIC VALVE             PULMONIC VALVE LVOT Vmax:   93.00 cm/s  PR End Diast Vel: 1.81 msec LVOT Vmean:  61.800 cm/s LVOT VTI:    0.156 m  AORTA Ao Root diam: 3.30 cm MITRAL VALVE MV Area (PHT): 3.91 cm    SHUNTS MV Decel Time: 194 msec    Systemic VTI:  0.16 m MV E velocity: 62.80  cm/s  Systemic Diam: 2.20 cm MV A velocity: 98.35 cm/s MV E/A ratio:  0.64 Riley Lam MD Electronically signed by Riley Lam MD Signature Date/Time: 07/11/2021/2:03:46 PM    Final    VAS [place] LOWER EXTREMITY VENOUS (DVT)  Result Date: 07/11/2021  Lower Venous DVT Study Patient Name:  AUM CAGGIANO  Date of Exam:   07/11/2021 Medical Rec #: [id number]      Accession #:    [id number] Date of Birth: 1961-04-15      Patient Gender: M Patient Age:   52 years Exam Location:  [facility] Procedure:      VAS [place] LOWER EXTREMITY VENOUS (DVT) Referring Phys: Dorcas Carrow --------------------------------------------------------------------------------  Indications: Swelling.  Risk Factors: HX of DVT & PE. Anticoagulation: Eliquis. Comparison Study: 04/11/2021 - Positive DVT RLE - CFV, FV, PopV & LLE FV(mid) Performing Technologist: Jody Hill RVT, RDMS  Examination Guidelines: A complete evaluation includes B-mode imaging, spectral Doppler, color Doppler, and power Doppler as needed of all accessible portions of each vessel. Bilateral testing is considered an integral  part of a complete examination. Limited examinations for reoccurring indications may be performed as noted. The reflux portion of the exam is performed with the patient in reverse Trendelenburg.  +---------+---------------+---------+-----------+----------+-------------------+ RIGHT    CompressibilityPhasicitySpontaneityPropertiesThrombus Aging      +---------+---------------+---------+-----------+----------+-------------------+ CFV      Full           Yes      Yes                                      +---------+---------------+---------+-----------+----------+-------------------+ SFJ      Partial                                      Chronic             +---------+---------------+---------+-----------+----------+-------------------+ FV Prox  Partial        Yes      Yes                  Chronic              +---------+---------------+---------+-----------+----------+-------------------+ FV Mid   None           No       No                   Chronic             +---------+---------------+---------+-----------+----------+-------------------+ FV DistalNone           No       No                   Chronic             +---------+---------------+---------+-----------+----------+-------------------+ PFV      Full                                                             +---------+---------------+---------+-----------+----------+-------------------+ POP      Partial        Yes      Yes                  Chronic             +---------+---------------+---------+-----------+----------+-------------------+ PTV      Full                                                             +---------+---------------+---------+-----------+----------+-------------------+ PERO     Full                                         Not well visualized +---------+---------------+---------+-----------+----------+-------------------+   +---------+---------------+---------+-----------+----------+-------------------+ LEFT     CompressibilityPhasicitySpontaneityPropertiesThrombus Aging      +---------+---------------+---------+-----------+----------+-------------------+ CFV      Full           Yes      Yes                                      +---------+---------------+---------+-----------+----------+-------------------+  SFJ      Full                                                             +---------+---------------+---------+-----------+----------+-------------------+ FV Prox  Full           Yes      Yes                                      +---------+---------------+---------+-----------+----------+-------------------+ FV Mid   Full           Yes      Yes                                      +---------+---------------+---------+-----------+----------+-------------------+ FV  DistalFull           Yes      Yes                                      +---------+---------------+---------+-----------+----------+-------------------+ PFV      Full                                                             +---------+---------------+---------+-----------+----------+-------------------+ POP      Full           Yes      Yes                                      +---------+---------------+---------+-----------+----------+-------------------+ PTV      Full                                         Not well visualized +---------+---------------+---------+-----------+----------+-------------------+ PERO     Full                                         Not well visualized +---------+---------------+---------+-----------+----------+-------------------+     Summary: BILATERAL: - No evidence of superficial venous thrombosis in the lower extremities, bilaterally. -No evidence of popliteal cyst, bilaterally. RIGHT: - Findings consistent with chronic deep vein thrombosis involving the right femoral vein, SF junction, and right popliteal vein.  LEFT: - There is no evidence of deep vein thrombosis in the lower extremity.  *See table(s) above for measurements and observations. Electronically signed by Lemar Livings MD on 07/11/2021 at 1:49:03 PM.    Final        Antibiotics: Anti-infectives (From admission, onward)    Start     Dose/Rate Route Frequency Ordered Stop   07/11/21 2200  doxycycline (VIBRA-TABS) tablet 100 mg  100 mg Oral Every 12 hours 07/11/21 1903     07/11/21 1800  cefTRIAXone (ROCEPHIN) 2 g in sodium chloride 0.9 % 100 mL IVPB        2 g 200 mL/hr over 30 Minutes Intravenous Every 24 hours 07/11/21 1655 07/16/21 1759   07/11/21 1745  azithromycin (ZITHROMAX) tablet 500 mg  Status:  Discontinued        500 mg Oral Daily 07/11/21 1655 07/11/21 1903         DVT prophylaxis: Apixaban  Code Status: Full code  Family Communication: No  family at bedside   Consultants:   Procedures:     Objective    Physical Examination:  General-appears in no acute distress Heart-S1-S2, regular, no murmur auscultated Lungs-clear to auscultation bilaterally, no wheezing or crackles auscultated Abdomen-soft, nontender, no organomegaly Extremities-no edema in the lower extremities Neuro-alert, oriented x3, no focal deficit noted  Status is: Inpatient  Dispo: The patient is from: correctional facility              Anticipated d/c is to: correctional facility              Anticipated d/c date is:07/15/21              Patient currently not stable for discharge  Barrier to discharge- ongoing dyspnea  COVID-19 Labs  No results for input(s): DDIMER, FERRITIN, LDH, CRP in the last 72 hours.  Lab Results  Component Value Date   SARSCOV2NAA NEGATIVE 07/10/2021   SARSCOV2NAA NEGATIVE 04/04/2021   SARSCOV2NAA NEGATIVE 03/30/2021   SARSCOV2NAA NEGATIVE 03/21/2021            Recent Results (from the past 240 hour(s))  Resp Panel by RT-PCR (Flu A&B, Covid) Nasopharyngeal Swab     Status: None   Collection Time: 07/10/21 10:37 AM   Specimen: Nasopharyngeal Swab; Nasopharyngeal(NP) swabs in vial transport medium  Result Value Ref Range Status   SARS Coronavirus 2 by RT PCR NEGATIVE NEGATIVE Final    Comment: (NOTE) SARS-CoV-2 target nucleic acids are NOT DETECTED.  The SARS-CoV-2 RNA is generally detectable in upper respiratory specimens during the acute phase of infection. The lowest concentration of SARS-CoV-2 viral copies this assay can detect is 138 copies/mL. A negative result does not preclude SARS-Cov-2 infection and should not be used as the sole basis for treatment or other patient management decisions. A negative result may occur with  improper specimen collection/handling, submission of specimen other than nasopharyngeal swab, presence of viral mutation(s) within the areas targeted by this assay, and  inadequate number of viral copies(<138 copies/mL). A negative result must be combined with clinical observations, patient history, and epidemiological information. The expected result is Negative.  Fact Sheet for Patients:  BloggerCourse.com  Fact Sheet for Healthcare Providers:  SeriousBroker.it  This test is no t yet approved or cleared by the [place] FDA and  has been authorized for detection and/or diagnosis of SARS-CoV-2 by FDA under an Emergency Use Authorization (EUA). This EUA will remain  in effect (meaning this test can be used) for the duration of the COVID-19 declaration under Section 564(b)(1) of the Act, 21 U.S.C.section 360bbb-3(b)(1), unless the authorization is terminated  or revoked sooner.       Influenza A by PCR NEGATIVE NEGATIVE Final   Influenza B by PCR NEGATIVE NEGATIVE Final    Comment: (NOTE) The Xpert Xpress SARS-CoV-2/FLU/RSV plus assay is intended as an aid in the diagnosis of influenza from Nasopharyngeal swab specimens and should not  be used as a sole basis for treatment. Nasal washings and aspirates are unacceptable for Xpert Xpress SARS-CoV-2/FLU/RSV testing.  Fact Sheet for Patients: BloggerCourse.com  Fact Sheet for Healthcare Providers: SeriousBroker.it  This test is not yet approved or cleared by the [place] FDA and has been authorized for detection and/or diagnosis of SARS-CoV-2 by FDA under an Emergency Use Authorization (EUA). This EUA will remain in effect (meaning this test can be used) for the duration of the COVID-19 declaration under Section 564(b)(1) of the Act, 21 U.S.C. section 360bbb-3(b)(1), unless the authorization is terminated or revoked.  Performed at [facility], [address] [place], [place] [address]   MRSA Next Gen by PCR, Nasal     Status: None   Collection Time: 07/11/21  1:00 PM    Specimen: Nasal Mucosa; Nasal Swab  Result Value Ref Range Status   MRSA by PCR Next Gen NOT DETECTED NOT DETECTED Final    Comment: (NOTE) The GeneXpert MRSA Assay (FDA approved for NASAL specimens only), is one component of a comprehensive MRSA colonization surveillance program. It is not intended to diagnose MRSA infection nor to guide or monitor treatment for MRSA infections. Test performance is not FDA approved in patients less than 47 years old. Performed at [facility], [address], [place], [place] [address]   Culture, blood (routine x 2) Call MD if unable to obtain prior to antibiotics being given     Status: None (Preliminary result)   Collection Time: 07/11/21  5:22 PM   Specimen: BLOOD  Result Value Ref Range Status   Specimen Description BLOOD RIGHT UPPER ARM  Final   Special Requests   Final    IN PEDIATRIC BOTTLE Blood Culture results may not be optimal due to an excessive volume of blood received in culture bottles   Culture   Final    NO GROWTH < 12 HOURS Performed at [facility], [address] [place], [place] [address]    Report Status PENDING  Incomplete  Culture, blood (routine x 2) Call MD if unable to obtain prior to antibiotics being given     Status: None (Preliminary result)   Collection Time: 07/11/21  5:45 PM   Specimen: BLOOD LEFT HAND  Result Value Ref Range Status   Specimen Description BLOOD LEFT HAND  Final   Special Requests IN PEDIATRIC BOTTLE Blood Culture adequate volume  Final   Culture   Final    NO GROWTH < 12 HOURS Performed at [facility], [address], [place], [place] [address]    Report Status PENDING  Incomplete    Meredeth Ide   [facility] If 7PM-7AM, please contact night-coverage at [email or web address], Office  [phone]   07/12/2021, 5:56 PM  LOS: 2 days

## 2021-07-12 NOTE — Progress Notes (Signed)
Overnight event Spoke to RN and RT. Patient's oxygen saturation dropped to the 70s.  Increased to 13 L HFNC and satting around 80%.  No respiratory distress.  He was given albuterol neb treatment by RT, not wheezing.  Per RT, underwent tracheostomy decannulation 5 days ago.  CTA chest done 2 days ago negative for PE.  Showing pulmonary edema, bilateral trace effusions, and left lower lobe consolidation. He is receiving IV Lasix 80 mg twice daily, antibiotics for pneumonia, and is also on Eliquis due to history of DVT/PE.   -Advised RT to try NRB, if oxygen saturation does not improve, then BiPAP -Stat chest x-ray ordered -PCCM team consulted and will see the patient.  Appreciate help.

## 2021-07-12 NOTE — Progress Notes (Signed)
ANTICOAGULATION CONSULT NOTE - Initial Consult  Pharmacy Consult for Apixaban Indication: history of pulmonary embolus  No Known Allergies  Patient Measurements: Height: 5\' 9"  (175.3 cm) Weight: 106.4 kg (234 lb 9.1 oz) IBW/kg (Calculated) : 70.7   Vital Signs: Temp: 97.5 F (36.4 C) (10/12 0810) Temp Source: Oral (10/12 0810) BP: 156/93 (10/12 0810) Pulse Rate: 97 (10/12 0810)  Labs: Recent Labs    07/10/21 1036 07/10/21 1209 07/11/21 0508 07/11/21 1817 07/12/21 0218  HGB 17.3* 20.1* 16.6  --  17.5*  HCT 58.6* 59.0* 55.7*  --  57.9*  PLT 146*  --  145*  --  157  CREATININE 1.03  --  1.15 1.30* 1.24  TROPONINIHS 31*  --   --   --   --     Estimated Creatinine Clearance: 77.1 mL/min (by C-G formula based on SCr of 1.24 mg/dL).   Medical History: Past Medical History:  Diagnosis Date   Bipolar 1 disorder (HCC)    COPD (chronic obstructive pulmonary disease) (HCC)    Diabetes (HCC)    GERD (gastroesophageal reflux disease)    HLD (hyperlipidemia)    HTN (hypertension)    Schizophrenia (HCC) 01/31/2021     Monitor platelets by anticoagulation protocol: Yes   Plan:  Continue apixaban 5 mg po bid (home medication)  Graiden Henes J Sherman Donaldson BS, PharmD, BCPS, RPh 07/12/2021,10:20 AM

## 2021-07-13 DIAGNOSIS — J9621 Acute and chronic respiratory failure with hypoxia: Secondary | ICD-10-CM | POA: Diagnosis not present

## 2021-07-13 DIAGNOSIS — I82401 Acute embolism and thrombosis of unspecified deep veins of right lower extremity: Secondary | ICD-10-CM | POA: Diagnosis not present

## 2021-07-13 DIAGNOSIS — J810 Acute pulmonary edema: Secondary | ICD-10-CM | POA: Diagnosis not present

## 2021-07-13 DIAGNOSIS — I5022 Chronic systolic (congestive) heart failure: Secondary | ICD-10-CM | POA: Diagnosis not present

## 2021-07-13 DIAGNOSIS — E119 Type 2 diabetes mellitus without complications: Secondary | ICD-10-CM | POA: Diagnosis not present

## 2021-07-13 LAB — MAGNESIUM: Magnesium: 1.7 mg/dL (ref 1.7–2.4)

## 2021-07-13 LAB — GLUCOSE, CAPILLARY
Glucose-Capillary: 198 mg/dL — ABNORMAL HIGH (ref 70–99)
Glucose-Capillary: 120 mg/dL — ABNORMAL HIGH (ref 70–99)
Glucose-Capillary: 233 mg/dL — ABNORMAL HIGH (ref 70–99)
Glucose-Capillary: 115 mg/dL — ABNORMAL HIGH (ref 70–99)

## 2021-07-13 LAB — BASIC METABOLIC PANEL
Anion gap: 11 (ref 5–15)
BUN: 17 mg/dL (ref 6–20)
CO2: 35 mmol/L — ABNORMAL HIGH (ref 22–32)
Calcium: 8.3 mg/dL — ABNORMAL LOW (ref 8.9–10.3)
Chloride: 90 mmol/L — ABNORMAL LOW (ref 98–111)
Creatinine, Ser: 1.08 mg/dL (ref 0.61–1.24)
GFR, Estimated: >60 mL/min (ref >60)
Glucose, Bld: 76 mg/dL (ref 70–99)
Potassium: 3.9 mmol/L (ref 3.5–5.1)
Sodium: 136 mmol/L (ref 135–145)

## 2021-07-13 LAB — CBC WITH DIFFERENTIAL/PLATELET
Abs Immature Granulocytes: 0.02 10*3/uL (ref 0.00–0.07)
Basophils Absolute: 0 10*3/uL (ref 0.0–0.1)
Basophils Relative: 0 %
Eosinophils Absolute: 0.1 10*3/uL (ref 0.0–0.5)
Eosinophils Relative: 2 %
HCT: 60.9 % — ABNORMAL HIGH (ref 39.0–52.0)
Hemoglobin: 18.3 g/dL — ABNORMAL HIGH (ref 13.0–17.0)
Immature Granulocytes: 0 %
Lymphocytes Relative: 28 %
Lymphs Abs: 1.5 10*3/uL (ref 0.7–4.0)
MCH: 27.1 pg (ref 26.0–34.0)
MCHC: 30 g/dL (ref 30.0–36.0)
MCV: 90.2 fL (ref 80.0–100.0)
Monocytes Absolute: 0.8 10*3/uL (ref 0.1–1.0)
Monocytes Relative: 15 %
Neutro Abs: 3 10*3/uL (ref 1.7–7.7)
Neutrophils Relative %: 55 %
Platelets: 166 10*3/uL (ref 150–400)
RBC: 6.75 MIL/uL — ABNORMAL HIGH (ref 4.22–5.81)
RDW: 20 % — ABNORMAL HIGH (ref 11.5–15.5)
WBC: 5.5 10*3/uL (ref 4.0–10.5)
nRBC: 0 % (ref 0.0–0.2)

## 2021-07-13 LAB — PROCALCITONIN: Procalcitonin: <0.1 ng/mL

## 2021-07-13 NOTE — Progress Notes (Signed)
[facility]  PROGRESS NOTE  Tommy Mathews DXA:[id number] DOB: [birth year] DOA: 07/10/2021 PCP: Georgann Housekeeper, MD   Brief HPI:   60 year old male with past medical history of traumatic brain injury, prior tracheostomy, hypertension, hyperlipidemia, PAF, diabetes mellitus type 2, COPD, chronic respiratory failure on oxygen 2 to 4 L/min, bipolar disorder was hypotensive on admission and in respiratory distress.  Started on BiPAP and nitroglycerin drip on admission.  He is diuresing well with IV Lasix.    Subjective   Patient seen and examined, last night O2 sats dropped to 70s despite escalation of oxygen, PCCM was consulted.  At this time he denies shortness of breath but still requiring 30 L/min HFNC.   Assessment/Plan:    Acute on chronic hypoxemic respiratory failure -Likely combination of acute on chronic diastolic CHF, hypertensive emergency, community-acquired pneumonia -Treated with BiPAP -Currently requiring oxygen HFNC 30 L/min -He was tachypneic on admission, chest x-ray and CT scan showed multifocal pneumonia and also atelectasis -Continue IV antibiotics; doxycycline -Diuresing well, continue Lasix 80 mg IV twice daily -Echocardiogram showed diastolic dysfunction -Appreciate PCCM input   Hypertensive emergency with acute on chronic diastolic CHF -Blood pressure has improved -He was started on nitroglycerin drip; which has been discontinued -Currently on clonidine 0.1 mg 3 times daily, Imdur, hydralazine and Lasix  History of DVT/PE -Proximal 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 -Continue Eliquis 5 mg twice daily  Paroxysmal atrial fibrillation -Heart rate controlled -He has documented history of PAF -Continue digoxin -Continue Eliquis as above  Mood disorder -History of schizophrenia/bipolar disorder -Continue Seroquel  Diabetes mellitus type 2 -Continue sliding scale insulin with NovoLog, Levemir  History of tracheostomy -Still  has stoma -Respiratory therapy consulted   Scheduled medications:    apixaban  5 mg Oral BID   cloNIDine  0.1 mg Oral TID   digoxin  0.25 mg Oral Daily   divalproex  750 mg Oral BID   doxycycline  100 mg Oral Q12H   FLUoxetine  20 mg Oral Daily   furosemide  80 mg Intravenous BID   hydrALAZINE  50 mg Oral Q8H   insulin aspart  0-5 Units Subcutaneous QHS   insulin aspart  0-9 Units Subcutaneous TID WC   insulin detemir  10 Units Subcutaneous QHS   ipratropium  0.5 mg Nebulization BID   isosorbide mononitrate  30 mg Oral Daily   QUEtiapine  200 mg Oral QHS   QUEtiapine  50 mg Oral Daily     Data Reviewed:   CBG:  Recent Labs  Lab 07/12/21 1615 07/12/21 2034 07/13/21 0828 07/13/21 1218 07/13/21 1619  GLUCAP 119* 181* 198* 120* 233*    SpO2: 96 % O2 Flow Rate (L/min): 13 L/min FiO2 (%): 50 %    Vitals:   07/13/21 0826 07/13/21 0840 07/13/21 1219 07/13/21 1615  BP: (!) 154/96  (!) 142/91 (!) 136/91  Pulse: 87 88 98 99  Resp: 17  15 (!) 22  Temp: (!) 97.5 F (36.4 C)  98 F (36.7 C) 97.9 F (36.6 C)  TempSrc: Axillary  Axillary Axillary  SpO2: 92%  94% 96%  Weight:      Height:         Intake/Output Summary (Last 24 hours) at 07/13/2021 1800 Last data filed at 07/13/2021 1223 Gross per 24 hour  Intake 340 ml  Output 5100 ml  Net -4760 ml    10/11 1901 - 10/13 0700 In: 1160 [P.O.:960] Out: 9850 [Urine:9850]  [employer]  07/10/21 1115 07/12/21 0332  Weight: 102.1 kg 106.4 kg    Data Reviewed: Basic Metabolic Panel: Recent Labs  Lab 07/10/21 1036 07/10/21 1209 07/11/21 0508 07/11/21 1817 07/12/21 0218 07/13/21 0645  NA 141 141 140 136 136 136  K 3.9 5.2* 3.6 4.1 5.0 3.9  CL 103  --  98 95* 90* 90*  CO2 30  --  34* 32 38* 35*  GLUCOSE 167*  --  129* 162* 103* 76  BUN 5*  --  10 13 15 17   CREATININE 1.03  --  1.15 1.30* 1.24 1.08  CALCIUM 8.5*  --  8.2* 7.9* 8.0* 8.3*  MG  --   --   --  1.5* 1.7 1.7   Liver Function  Tests: Recent Labs  Lab 07/10/21 1036  AST 19  ALT 15  ALKPHOS 58  BILITOT 1.0  PROT 6.8  ALBUMIN 2.8*   No results for input(s): LIPASE, AMYLASE in the last 168 hours. No results for input(s): AMMONIA in the last 168 hours. CBC: Recent Labs  Lab 07/10/21 1036 07/10/21 1209 07/11/21 0508 07/12/21 0218 07/13/21 0645  WBC 4.5  --  6.6 6.8 5.5  NEUTROABS 2.7  --   --  4.1 3.0  HGB 17.3* 20.1* 16.6 17.5* 18.3*  HCT 58.6* 59.0* 55.7* 57.9* 60.9*  MCV 91.4  --  90.0 89.4 90.2  PLT 146*  --  145* 157 166   Cardiac Enzymes: No results for input(s): CKTOTAL, CKMB, CKMBINDEX, TROPONINI in the last 168 hours. BNP (last 3 results) Recent Labs    02/06/21 1215 03/21/21 1111 07/10/21 1050  BNP 28.6 263.8* 1,316.0*    ProBNP (last 3 results) No results for input(s): PROBNP in the last 8760 hours.  CBG: Recent Labs  Lab 07/12/21 1615 07/12/21 2034 07/13/21 0828 07/13/21 1218 07/13/21 1619  GLUCAP 119* 181* 198* 120* 233*       Radiology Reports  DG CHEST PORT 1 VIEW  Result Date: 07/12/2021 CLINICAL DATA:  Hypoxia EXAM: PORTABLE CHEST 1 VIEW COMPARISON:  07/10/2021 FINDINGS: Single frontal view of the chest demonstrates an enlarged cardiac silhouette. There is been improvement in the central vascular congestion, bilateral airspace disease, and effusion since prior study. Minimal basilar airspace disease and small effusions remain. No pneumothorax. IMPRESSION: 1. Improving volume status, with persistent but decreased congestive heart failure. Electronically Signed   By: 09/09/2021 M.D.   On: 07/12/2021 21:19       Antibiotics: Anti-infectives (From admission, onward)    Start     Dose/Rate Route Frequency Ordered Stop   07/11/21 2200  doxycycline (VIBRA-TABS) tablet 100 mg        100 mg Oral Every 12 hours 07/11/21 1903     07/11/21 1800  cefTRIAXone (ROCEPHIN) 2 g in sodium chloride 0.9 % 100 mL IVPB        2 g 200 mL/hr over 30 Minutes Intravenous Every  24 hours 07/11/21 1655 07/16/21 1759   07/11/21 1745  azithromycin (ZITHROMAX) tablet 500 mg  Status:  Discontinued        500 mg Oral Daily 07/11/21 1655 07/11/21 1903         DVT prophylaxis: Apixaban  Code Status: Full code  Family Communication: No family at bedside   Consultants:   Procedures:     Objective    Physical Examination:  General-appears in no acute distress Heart-S1-S2, regular, no murmur auscultated Lungs-decreased breath sounds bilaterally Abdomen-soft, nontender, no organomegaly Extremities-trace edema in the lower extremities  Neuro-alert, oriented x3, no focal deficit noted  Status is: Inpatient  Dispo: The patient is from: correctional facility              Anticipated d/c is to: correctional facility              Anticipated d/c date is:07/15/21              Patient currently not stable for discharge  Barrier to discharge- ongoing dyspnea  COVID-19 Labs  No results for input(s): DDIMER, FERRITIN, LDH, CRP in the last 72 hours.  Lab Results  Component Value Date   SARSCOV2NAA NEGATIVE 07/10/2021   SARSCOV2NAA NEGATIVE 04/04/2021   SARSCOV2NAA NEGATIVE 03/30/2021   SARSCOV2NAA NEGATIVE 03/21/2021            Recent Results (from the past 240 hour(s))  Resp Panel by RT-PCR (Flu A&B, Covid) Nasopharyngeal Swab     Status: None   Collection Time: 07/10/21 10:37 AM   Specimen: Nasopharyngeal Swab; Nasopharyngeal(NP) swabs in vial transport medium  Result Value Ref Range Status   SARS Coronavirus 2 by RT PCR NEGATIVE NEGATIVE Final    Comment: (NOTE) SARS-CoV-2 target nucleic acids are NOT DETECTED.  The SARS-CoV-2 RNA is generally detectable in upper respiratory specimens during the acute phase of infection. The lowest concentration of SARS-CoV-2 viral copies this assay can detect is 138 copies/mL. A negative result does not preclude SARS-Cov-2 infection and should not be used as the sole basis for treatment or other  patient management decisions. A negative result may occur with  improper specimen collection/handling, submission of specimen other than nasopharyngeal swab, presence of viral mutation(s) within the areas targeted by this assay, and inadequate number of viral copies(<138 copies/mL). A negative result must be combined with clinical observations, patient history, and epidemiological information. The expected result is Negative.  Fact Sheet for Patients:  BloggerCourse.com  Fact Sheet for Healthcare Providers:  SeriousBroker.it  This test is no t yet approved or cleared by the [place] FDA and  has been authorized for detection and/or diagnosis of SARS-CoV-2 by FDA under an Emergency Use Authorization (EUA). This EUA will remain  in effect (meaning this test can be used) for the duration of the COVID-19 declaration under Section 564(b)(1) of the Act, 21 U.S.C.section 360bbb-3(b)(1), unless the authorization is terminated  or revoked sooner.       Influenza A by PCR NEGATIVE NEGATIVE Final   Influenza B by PCR NEGATIVE NEGATIVE Final    Comment: (NOTE) The Xpert Xpress SARS-CoV-2/FLU/RSV plus assay is intended as an aid in the diagnosis of influenza from Nasopharyngeal swab specimens and should not be used as a sole basis for treatment. Nasal washings and aspirates are unacceptable for Xpert Xpress SARS-CoV-2/FLU/RSV testing.  Fact Sheet for Patients: BloggerCourse.com  Fact Sheet for Healthcare Providers: SeriousBroker.it  This test is not yet approved or cleared by the [place] FDA and has been authorized for detection and/or diagnosis of SARS-CoV-2 by FDA under an Emergency Use Authorization (EUA). This EUA will remain in effect (meaning this test can be used) for the duration of the COVID-19 declaration under Section 564(b)(1) of the Act, 21 U.S.C. section  360bbb-3(b)(1), unless the authorization is terminated or revoked.  Performed at [facility], [address], [place], [place] [address]   MRSA Next Gen by PCR, Nasal     Status: None   Collection Time: 07/11/21  1:00 PM   Specimen: Nasal Mucosa; Nasal Swab  Result Value Ref  Range Status   MRSA by PCR Next Gen NOT DETECTED NOT DETECTED Final    Comment: (NOTE) The GeneXpert MRSA Assay (FDA approved for NASAL specimens only), is one component of a comprehensive MRSA colonization surveillance program. It is not intended to diagnose MRSA infection nor to guide or monitor treatment for MRSA infections. Test performance is not FDA approved in patients less than 20 years old. Performed at [facility], [address], [place], [place] [address]   Culture, blood (routine x 2) Call MD if unable to obtain prior to antibiotics being given     Status: None (Preliminary result)   Collection Time: 07/11/21  5:22 PM   Specimen: BLOOD  Result Value Ref Range Status   Specimen Description BLOOD RIGHT UPPER ARM  Final   Special Requests   Final    IN PEDIATRIC BOTTLE Blood Culture results may not be optimal due to an excessive volume of blood received in culture bottles   Culture   Final    NO GROWTH 2 DAYS Performed at [facility], [address], [place], [place] [address]    Report Status PENDING  Incomplete  Culture, blood (routine x 2) Call MD if unable to obtain prior to antibiotics being given     Status: None (Preliminary result)   Collection Time: 07/11/21  5:45 PM   Specimen: BLOOD LEFT HAND  Result Value Ref Range Status   Specimen Description BLOOD LEFT HAND  Final   Special Requests IN PEDIATRIC BOTTLE Blood Culture adequate volume  Final   Culture   Final    NO GROWTH 2 DAYS Performed at [facility], [address], [place], [place] [address]    Report Status PENDING  Incomplete    Meredeth Ide   [facility] If 7PM-7AM, please contact  night-coverage at [email or web address], Office  [phone]   07/13/2021, 6:00 PM  LOS: 3 days

## 2021-07-13 NOTE — Progress Notes (Signed)
NAME:  Tommy Mathews, MRN:  [id number], DOB:  [birth year], LOS: 3 ADMISSION DATE:  07/10/2021 CONSULTATION DATE:  07/12/2021 REFERRING MD:  Loney Loh - [facility] CHIEF COMPLAINT:  Hypoxia  History of Present Illness:  Tommy Mathews is seen in consultation at the request of Dr. Loney Loh [facility]) for recommendations on further evaluation and management of progressive hypoxia in the setting of acute-on-chronic respiratory failure.  60 year old man who initially [facility] to [facility] ED 10/10 from correctional facility for sudden onset of SOB. PMHx significant for HTN, HLD, paroxysmal AF, T2DM (insulin-dependent), schizophrenia/bipolar disorder, COPD with acute-on-chronic respiratory failure (requiring 2-4L O2 therapy at baseline) and TBI with need for tracheostomy (s/p decannulation 07/07/2021).   Patient presented to [facility] ED from his correctional facility after experiencing increased SOB, dyspnea. Arrived on 15L O2, s/p Lasix administration with good diuresis He was transitioned to BiPAP. Per patient, he utilizes 2-4L HOT but does not wear this consistently. Additionally, patient was hypertensive on admission with BP 215/117 (per chart review). Reported cough and frothy sputum on admission as well as LE edema. CXR on admission showed bilateral opacities c/f pulmonary edema vs. PNA with small bilateral pleural effusions. Given ongoing dyspnea/hypoxia, LE dopplers were completed (negative for DVT) as well as CTA Chest, which was negative for PE but demonstrated findings consistent with pulmonary HTN and pulmonary edema as well as LLL opacity. Lasix BID, bronchodilators and empiric coverage for PNA was intiated.  10/12PM, patient was noted to have decreased O2 saturations to 70s despite escalation of O2 needs, though without respiratory distress. PCCM consulted for worsening hypoxia and increasing oxygen requirement. He has been incarcerated since July and awaiting court date  Pertinent Medical History:   Past Medical  History:  Diagnosis Date   Bipolar 1 disorder (HCC)    COPD (chronic obstructive pulmonary disease) (HCC)    Diabetes (HCC)    GERD (gastroesophageal reflux disease)    HLD (hyperlipidemia)    HTN (hypertension)    Schizophrenia (HCC) 01/31/2021   TBI S/p tracheostomy (decannulated 07/2021) CT angiogram 03/2021 suggestive of PE VQ scan 01/2021 neg   Significant Hospital Events: Including procedures, antibiotic start and stop dates in addition to other pertinent events   10/10 Admitted to [facility] 10/12 Worsening hypoxia despite escalation of supplemental O2 therapy, PCCM consulted for evaluation/management recs Echo 10/11 normal LV function, mild LVH, decreased RV function, RVSP could not be estimated. Venous duplex 10/11 chronic DVT right femoral vein and popliteal vein  Interim History / Subjective:   Diuresed 6 L. Remains on 15 L high flow nasal cannula. Denies chest pain, dyspnea improved   Objective:  Blood pressure (!) 154/96, pulse 88, temperature (!) 97.5 F (36.4 C), temperature source Axillary, resp. rate 17, height 5\' 9"  (1.753 m), weight 106.4 kg, SpO2 92 %.        Intake/Output Summary (Last 24 hours) at 07/13/2021 1138 Last data filed at 07/13/2021 0646 Gross per 24 hour  Intake 820 ml  Output 4500 ml  Net -3680 ml    Filed Weights   07/10/21 1115 07/12/21 0332  Weight: 102.1 kg 106.4 kg   Physical Examination: General: Chronically ill-appearing middle-aged man in no distress , shackled in bed with police officers in the room HEENT: [place]/AT, anicteric sclera, PERRL, dry mucous membranes. Prior tracheostomy stoma ,no secretions, no bleeding. Neuro: Alert, interactive, slurred speech CV: RRR, no m/g/r. PULM: Few crackles right base, no rhonchi, no accessory muscle use GI: Soft, nontender, nondistended. Normoactive bowel sounds. Extremities: Bilateral symmetric 1+ pitting  LE edema noted. Skin: Warm/dry, seborrheic-appearing dermatitis around  face/scalp.   Chest x-ray 10/12 independently reviewed shows decreased edema pattern Labs show normal electrolytes, decrease in creatinine, negative procalcitonin, no leukocytosis mild polycythemia  Resolved Hospital Problem List:    Assessment & Plan:  Tommy Mathews is seen in consultation at the request of Dr. Loney Loh [facility]) for recommendations on further evaluation and management of progressive hypoxia in the setting of acute-on-chronic respiratory failure after patient was noted to have decreased O2 saturations to 70s despite escalation of O2 therapy.  Acute-on-chronic hypoxemic respiratory failure HFpEF (EF 55% on Echo 10/11) Acute pulmonary edema vs. PNA History of COPD Patient presented to [facility] ED 10/10 from correctional facility after acute onset of SOB. Working diagnosis at the time of admission was flash pulmonary edema, given appearance of infiltrates on CXR, frothy sputum and hypertension. Differential initially included PE for which LE dopplers and CTA chest were completed (dopplers demonstrating chronic proximal femoral DVT, no acute thrombus; CTA chest negative for PE but demonstrating LLL opacity and pulmonary edema as well as enlarged pulmonary artery). Echo showed EF 55% with grade I diastolic dysfunction. Ongoing working diagnoses are pulmonary edema/CHF with possible superimposed PNA.  Low procalcitonin makes infection less likely -Does seem to have pulmonary hypertension with decreased RV function, although no evidence of CTEPH  - Wean O2 as able for sat > 88% - Continue diuresis, monitor renal function - Continue bronchodilators -Continue empiric antibiotics although low procalcitonin makes infection less likely - Pulmonary hygiene - Chest PT   Best Practice: (right click and "Reselect all SmartList Selections" daily)   Per Primary Team  Labs:  CBC: Recent Labs  Lab 07/10/21 1036 07/10/21 1209 07/11/21 0508 07/12/21 0218 07/13/21 0645  WBC 4.5  --  6.6 6.8  5.5  NEUTROABS 2.7  --   --  4.1 3.0  HGB 17.3* 20.1* 16.6 17.5* 18.3*  HCT 58.6* 59.0* 55.7* 57.9* 60.9*  MCV 91.4  --  90.0 89.4 90.2  PLT 146*  --  145* 157 166    Basic Metabolic Panel: Recent Labs  Lab 07/10/21 1036 07/10/21 1209 07/11/21 0508 07/11/21 1817 07/12/21 0218 07/13/21 0645  NA 141 141 140 136 136 136  K 3.9 5.2* 3.6 4.1 5.0 3.9  CL 103  --  98 95* 90* 90*  CO2 30  --  34* 32 38* 35*  GLUCOSE 167*  --  129* 162* 103* 76  BUN 5*  --  10 13 15 17   CREATININE 1.03  --  1.15 1.30* 1.24 1.08  CALCIUM 8.5*  --  8.2* 7.9* 8.0* 8.3*  MG  --   --   --  1.5* 1.7 1.7    GFR: Estimated Creatinine Clearance: 88.5 mL/min (by C-G formula based on SCr of 1.08 mg/dL). Recent Labs  Lab 07/10/21 1036 07/11/21 0508 07/11/21 1722 07/12/21 0218 07/13/21 0645  PROCALCITON  --   --  <0.10 <0.10 <0.10  WBC 4.5 6.6  --  6.8 5.5    Liver Function Tests: Recent Labs  Lab 07/10/21 1036  AST 19  ALT 15  ALKPHOS 58  BILITOT 1.0  PROT 6.8  ALBUMIN 2.8*    No results for input(s): LIPASE, AMYLASE in the last 168 hours. No results for input(s): AMMONIA in the last 168 hours.  ABG:    Component Value Date/Time   PHART 7.412 07/12/2021 2254   PCO2ART 57.4 (H) 07/12/2021 2254   PO2ART 95.8 07/12/2021 2254   HCO3 35.8 (H)  07/12/2021 2254   TCO2 40 (H) 07/10/2021 1209   ACIDBASEDEF 0.3 01/13/2018 0419   O2SAT 96.2 07/12/2021 2254     Coagulation Profile: No results for input(s): INR, PROTIME in the last 168 hours.  Cardiac Enzymes: No results for input(s): CKTOTAL, CKMB, CKMBINDEX, TROPONINI in the last 168 hours.  HbA1C: Hgb A1c MFr Bld  Date/Time Value Ref Range Status  02/01/2021 12:47 PM 7.0 (H) 4.8 - 5.6 % Final    Comment:    (NOTE) Pre diabetes:          5.7%-6.4%  Diabetes:              >6.4%  Glycemic control for   <7.0% adults with diabetes   12/15/2019 01:22 AM 6.9 (H) 4.8 - 5.6 % Final    Comment:    (NOTE) Pre diabetes:           5.7%-6.4% Diabetes:              >6.4% Glycemic control for   <7.0% adults with diabetes      Cyril Mourning MD. FCCP. [facility] Pager : [phone]  If no response to pager , please call [phone] until 7 pm After 7:00 pm call Elink  [phone]   07/13/2021

## 2021-07-13 NOTE — Plan of Care (Signed)
  Problem: Activity: Goal: Risk for activity intolerance will decrease Outcome: Progressing   Problem: Nutrition: Goal: Adequate nutrition will be maintained Outcome: Progressing   Problem: Coping: Goal: Level of anxiety will decrease Outcome: Progressing   Problem: Elimination: Goal: Will not experience complications related to bowel motility Outcome: Progressing   

## 2021-07-13 NOTE — Discharge Instructions (Signed)
Information on my medicine - ELIQUIS? (apixaban) ? ?This medication education was reviewed with me or my healthcare representative as part of my discharge preparation.  The pharmacist that spoke with me during my hospital stay was:  ? ?Why was Eliquis? prescribed for you? ?Eliquis? was prescribed to treat blood clots that may have been found in the veins of your legs (deep vein thrombosis) or in your lungs (pulmonary embolism) and to reduce the risk of them occurring again. ? ?What do You need to know about Eliquis? ? ?Continue Eliquis 5 mg tablet taken TWICE daily.  Eliquis? may be taken with or without food.  ? ?Try to take the dose about the same time in the morning and in the evening. If you have difficulty swallowing the tablet whole please discuss with your pharmacist how to take the medication safely. ? ?Take Eliquis? exactly as prescribed and DO NOT stop taking Eliquis? without talking to the doctor who prescribed the medication.  Stopping may increase your risk of developing a new blood clot.  Refill your prescription before you run out. ? ?After discharge, you should have regular check-up appointments with your healthcare provider that is prescribing your Eliquis?. ?   ?What do you do if you miss a dose? ?If a dose of ELIQUIS? is not taken at the scheduled time, take it as soon as possible on the same day and twice-daily administration should be resumed. The dose should not be doubled to make up for a missed dose. ? ?Important Safety Information ?A possible side effect of Eliquis? is bleeding. You should call your healthcare provider right away if you experience any of the following: ?Bleeding from an injury or your nose that does not stop. ?Unusual colored urine (red or dark brown) or unusual colored stools (red or black). ?Unusual bruising for unknown reasons. ?A serious fall or if you hit your head (even if there is no bleeding). ? ?Some medicines may interact with Eliquis? and might increase your risk  of bleeding or clotting while on Eliquis?. To help avoid this, consult your healthcare provider or pharmacist prior to using any new prescription or non-prescription medications, including herbals, vitamins, non-steroidal anti-inflammatory drugs (NSAIDs) and supplements. ? ?This website has more information on Eliquis? (apixaban): http://www.eliquis.com/eliquis/home ? ?

## 2021-07-13 NOTE — Progress Notes (Signed)
Patient removed condom cath and bed was wet with immeasurable amount of urine.  Incontinent care provided.

## 2021-07-14 DIAGNOSIS — J432 Centrilobular emphysema: Secondary | ICD-10-CM

## 2021-07-14 DIAGNOSIS — J9621 Acute and chronic respiratory failure with hypoxia: Secondary | ICD-10-CM | POA: Diagnosis not present

## 2021-07-14 DIAGNOSIS — J810 Acute pulmonary edema: Secondary | ICD-10-CM | POA: Diagnosis not present

## 2021-07-14 LAB — GLUCOSE, CAPILLARY
Glucose-Capillary: 178 mg/dL — ABNORMAL HIGH (ref 70–99)
Glucose-Capillary: 217 mg/dL — ABNORMAL HIGH (ref 70–99)
Glucose-Capillary: 112 mg/dL — ABNORMAL HIGH (ref 70–99)
Glucose-Capillary: 197 mg/dL — ABNORMAL HIGH (ref 70–99)

## 2021-07-14 LAB — CBC WITH DIFFERENTIAL/PLATELET
Abs Immature Granulocytes: 0.04 10*3/uL (ref 0.00–0.07)
Basophils Absolute: 0 10*3/uL (ref 0.0–0.1)
Basophils Relative: 0 %
Eosinophils Absolute: 0.1 10*3/uL (ref 0.0–0.5)
Eosinophils Relative: 2 %
HCT: 59.9 % — ABNORMAL HIGH (ref 39.0–52.0)
Hemoglobin: 17.9 g/dL — ABNORMAL HIGH (ref 13.0–17.0)
Immature Granulocytes: 1 %
Lymphocytes Relative: 35 %
Lymphs Abs: 2 10*3/uL (ref 0.7–4.0)
MCH: 26.4 pg (ref 26.0–34.0)
MCHC: 29.9 g/dL — ABNORMAL LOW (ref 30.0–36.0)
MCV: 88.5 fL (ref 80.0–100.0)
Monocytes Absolute: 0.9 10*3/uL (ref 0.1–1.0)
Monocytes Relative: 15 %
Neutro Abs: 2.7 10*3/uL (ref 1.7–7.7)
Neutrophils Relative %: 47 %
Platelets: 187 10*3/uL (ref 150–400)
RBC: 6.77 MIL/uL — ABNORMAL HIGH (ref 4.22–5.81)
RDW: 19.9 % — ABNORMAL HIGH (ref 11.5–15.5)
WBC: 5.7 10*3/uL (ref 4.0–10.5)
nRBC: 0 % (ref 0.0–0.2)

## 2021-07-14 LAB — BASIC METABOLIC PANEL
Anion gap: 9 (ref 5–15)
BUN: 21 mg/dL — ABNORMAL HIGH (ref 6–20)
CO2: 35 mmol/L — ABNORMAL HIGH (ref 22–32)
Calcium: 8.4 mg/dL — ABNORMAL LOW (ref 8.9–10.3)
Chloride: 88 mmol/L — ABNORMAL LOW (ref 98–111)
Creatinine, Ser: 1.15 mg/dL (ref 0.61–1.24)
GFR, Estimated: >60 mL/min (ref >60)
Glucose, Bld: 112 mg/dL — ABNORMAL HIGH (ref 70–99)
Potassium: 3.8 mmol/L (ref 3.5–5.1)
Sodium: 132 mmol/L — ABNORMAL LOW (ref 135–145)

## 2021-07-14 LAB — MAGNESIUM: Magnesium: 1.8 mg/dL (ref 1.7–2.4)

## 2021-07-14 NOTE — Progress Notes (Signed)
[facility]  PROGRESS NOTE  Tommy Mathews MWN:[id number] DOB: Jul 10, 1961 DOA: 07/10/2021 PCP: Georgann Housekeeper, MD   Brief HPI:   60 year old male with past medical history of traumatic brain injury, prior tracheostomy, hypertension, hyperlipidemia, PAF, diabetes mellitus type 2, COPD, chronic respiratory failure on oxygen 2 to 4 L/min, bipolar disorder was hypotensive on admission and in respiratory distress.  Started on BiPAP and nitroglycerin drip on admission.  He is diuresing well with IV Lasix.    Subjective   Patient seen and examined, still continues to require 10 to 12 L oxygen HFNC.  Denies any complaints.   Assessment/Plan:    Acute on chronic hypoxemic respiratory failure -Likely combination of acute on chronic diastolic CHF, hypertensive emergency, community-acquired pneumonia -Treated with BiPAP -Currently requiring oxygen HFNC 30 L/min -He was tachypneic on admission, chest x-ray and CT scan showed multifocal pneumonia and also atelectasis -Continue IV antibiotics; doxycycline -Diuresing well, continue Lasix 80 mg IV twice daily -Echocardiogram showed diastolic dysfunction -PCCM following   Hypertensive emergency with acute on chronic diastolic CHF -Blood pressure has improved -He was started on nitroglycerin drip; which has been discontinued -Currently on clonidine 0.1 mg 3 times daily, Imdur, hydralazine and Lasix  History of DVT/PE -Proximal 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 -Continue Eliquis 5 mg twice daily  Paroxysmal atrial fibrillation -Heart rate controlled -He has documented history of PAF -Continue digoxin -Continue Eliquis as above  Mood disorder -History of schizophrenia/bipolar disorder -Continue Seroquel  Diabetes mellitus type 2 -Continue sliding scale insulin with NovoLog, Levemir  History of tracheostomy -Still has stoma -Respiratory therapy consulted   Scheduled medications:    apixaban  5 mg Oral  BID   cloNIDine  0.1 mg Oral TID   digoxin  0.25 mg Oral Daily   divalproex  750 mg Oral BID   doxycycline  100 mg Oral Q12H   FLUoxetine  20 mg Oral Daily   furosemide  80 mg Intravenous BID   hydrALAZINE  50 mg Oral Q8H   insulin aspart  0-5 Units Subcutaneous QHS   insulin aspart  0-9 Units Subcutaneous TID WC   insulin detemir  10 Units Subcutaneous QHS   ipratropium  0.5 mg Nebulization BID   isosorbide mononitrate  30 mg Oral Daily   QUEtiapine  200 mg Oral QHS   QUEtiapine  50 mg Oral Daily     Data Reviewed:   CBG:  Recent Labs  Lab 07/13/21 1218 07/13/21 1619 07/13/21 1935 07/14/21 0809 07/14/21 1115  GLUCAP 120* 233* 115* 178* 217*    SpO2: 94 % O2 Flow Rate (L/min): 6 L/min FiO2 (%): 50 %    Vitals:   07/14/21 1116 07/14/21 1200 07/14/21 1356 07/14/21 1526  BP: 112/82 133/73 133/73   Pulse: 98 94  87  Resp: 16 19  18   Temp: 98.3 F (36.8 C)     TempSrc: Oral     SpO2: 92% 93%  94%  Weight:      Height:         Intake/Output Summary (Last 24 hours) at 07/14/2021 1633 Last data filed at 07/14/2021 1330 Gross per 24 hour  Intake 720 ml  Output 2950 ml  Net -2230 ml    10/12 1901 - 10/14 0700 In: 240 [P.O.:240] Out: 5100 [Urine:5100]  Filed Weights   07/10/21 1115 07/12/21 0332 07/14/21 0500  Weight: 102.1 kg 106.4 kg 107.6 kg    Data Reviewed: Basic Metabolic Panel: Recent Labs  Lab  07/11/21 0508 07/11/21 1817 07/12/21 0218 07/13/21 0645 07/14/21 0411  NA 140 136 136 136 132*  K 3.6 4.1 5.0 3.9 3.8  CL 98 95* 90* 90* 88*  CO2 34* 32 38* 35* 35*  GLUCOSE 129* 162* 103* 76 112*  BUN 10 13 15 17  21*  CREATININE 1.15 1.30* 1.24 1.08 1.15  CALCIUM 8.2* 7.9* 8.0* 8.3* 8.4*  MG  --  1.5* 1.7 1.7 1.8   Liver Function Tests: Recent Labs  Lab 07/10/21 1036  AST 19  ALT 15  ALKPHOS 58  BILITOT 1.0  PROT 6.8  ALBUMIN 2.8*   No results for input(s): LIPASE, AMYLASE in the last 168 hours. No results for input(s): AMMONIA in  the last 168 hours. CBC: Recent Labs  Lab 07/10/21 1036 07/10/21 1209 07/11/21 0508 07/12/21 0218 07/13/21 0645 07/14/21 0411  WBC 4.5  --  6.6 6.8 5.5 5.7  NEUTROABS 2.7  --   --  4.1 3.0 2.7  HGB 17.3* 20.1* 16.6 17.5* 18.3* 17.9*  HCT 58.6* 59.0* 55.7* 57.9* 60.9* 59.9*  MCV 91.4  --  90.0 89.4 90.2 88.5  PLT 146*  --  145* 157 166 187   Cardiac Enzymes: No results for input(s): CKTOTAL, CKMB, CKMBINDEX, TROPONINI in the last 168 hours. BNP (last 3 results) Recent Labs    02/06/21 1215 03/21/21 1111 07/10/21 1050  BNP 28.6 263.8* 1,316.0*    ProBNP (last 3 results) No results for input(s): PROBNP in the last 8760 hours.  CBG: Recent Labs  Lab 07/13/21 1218 07/13/21 1619 07/13/21 1935 07/14/21 0809 07/14/21 1115  GLUCAP 120* 233* 115* 178* 217*       Radiology Reports  DG CHEST PORT 1 VIEW  Result Date: 07/12/2021 CLINICAL DATA:  Hypoxia EXAM: PORTABLE CHEST 1 VIEW COMPARISON:  07/10/2021 FINDINGS: Single frontal view of the chest demonstrates an enlarged cardiac silhouette. There is been improvement in the central vascular congestion, bilateral airspace disease, and effusion since prior study. Minimal basilar airspace disease and small effusions remain. No pneumothorax. IMPRESSION: 1. Improving volume status, with persistent but decreased congestive heart failure. Electronically Signed   By: 09/09/2021 M.D.   On: 07/12/2021 21:19       Antibiotics: Anti-infectives (From admission, onward)    Start     Dose/Rate Route Frequency Ordered Stop   07/11/21 2200  doxycycline (VIBRA-TABS) tablet 100 mg        100 mg Oral Every 12 hours 07/11/21 1903 07/15/21 2359   07/11/21 1800  cefTRIAXone (ROCEPHIN) 2 g in sodium chloride 0.9 % 100 mL IVPB        2 g 200 mL/hr over 30 Minutes Intravenous Every 24 hours 07/11/21 1655 07/16/21 1759   07/11/21 1745  azithromycin (ZITHROMAX) tablet 500 mg  Status:  Discontinued        500 mg Oral Daily 07/11/21 1655  07/11/21 1903         DVT prophylaxis: Apixaban  Code Status: Full code  Family Communication: No family at bedside   Consultants:   Procedures:     Objective    Physical Examination:  General-appears in no acute distress Heart-S1-S2, regular, no murmur auscultated Lungs-clear to auscultation bilaterally, no wheezing or crackles auscultated Abdomen-soft, nontender, no organomegaly Extremities-no edema in the lower extremities Neuro-alert, oriented x3, no focal deficit noted  Status is: Inpatient  Dispo: The patient is from: correctional facility              Anticipated d/c is to:  correctional facility              Anticipated d/c date is:07/18/21              Patient currently not stable for discharge  Barrier to discharge- ongoing dyspnea  COVID-19 Labs  No results for input(s): DDIMER, FERRITIN, LDH, CRP in the last 72 hours.  Lab Results  Component Value Date   SARSCOV2NAA NEGATIVE 07/10/2021   SARSCOV2NAA NEGATIVE 04/04/2021   SARSCOV2NAA NEGATIVE 03/30/2021   SARSCOV2NAA NEGATIVE 03/21/2021            Recent Results (from the past 240 hour(s))  Resp Panel by RT-PCR (Flu A&B, Covid) Nasopharyngeal Swab     Status: None   Collection Time: 07/10/21 10:37 AM   Specimen: Nasopharyngeal Swab; Nasopharyngeal(NP) swabs in vial transport medium  Result Value Ref Range Status   SARS Coronavirus 2 by RT PCR NEGATIVE NEGATIVE Final    Comment: (NOTE) SARS-CoV-2 target nucleic acids are NOT DETECTED.  The SARS-CoV-2 RNA is generally detectable in upper respiratory specimens during the acute phase of infection. The lowest concentration of SARS-CoV-2 viral copies this assay can detect is 138 copies/mL. A negative result does not preclude SARS-Cov-2 infection and should not be used as the sole basis for treatment or other patient management decisions. A negative result may occur with  improper specimen collection/handling, submission of specimen  other than nasopharyngeal swab, presence of viral mutation(s) within the areas targeted by this assay, and inadequate number of viral copies(<138 copies/mL). A negative result must be combined with clinical observations, patient history, and epidemiological information. The expected result is Negative.  Fact Sheet for Patients:  BloggerCourse.com  Fact Sheet for Healthcare Providers:  SeriousBroker.it  This test is no t yet approved or cleared by the [place] FDA and  has been authorized for detection and/or diagnosis of SARS-CoV-2 by FDA under an Emergency Use Authorization (EUA). This EUA will remain  in effect (meaning this test can be used) for the duration of the COVID-19 declaration under Section 564(b)(1) of the Act, 21 U.S.C.section 360bbb-3(b)(1), unless the authorization is terminated  or revoked sooner.       Influenza A by PCR NEGATIVE NEGATIVE Final   Influenza B by PCR NEGATIVE NEGATIVE Final    Comment: (NOTE) The Xpert Xpress SARS-CoV-2/FLU/RSV plus assay is intended as an aid in the diagnosis of influenza from Nasopharyngeal swab specimens and should not be used as a sole basis for treatment. Nasal washings and aspirates are unacceptable for Xpert Xpress SARS-CoV-2/FLU/RSV testing.  Fact Sheet for Patients: BloggerCourse.com  Fact Sheet for Healthcare Providers: SeriousBroker.it  This test is not yet approved or cleared by the [place] FDA and has been authorized for detection and/or diagnosis of SARS-CoV-2 by FDA under an Emergency Use Authorization (EUA). This EUA will remain in effect (meaning this test can be used) for the duration of the COVID-19 declaration under Section 564(b)(1) of the Act, 21 U.S.C. section 360bbb-3(b)(1), unless the authorization is terminated or revoked.  Performed at [facility], [address], [place],  [place] [address]   MRSA Next Gen by PCR, Nasal     Status: None   Collection Time: 07/11/21  1:00 PM   Specimen: Nasal Mucosa; Nasal Swab  Result Value Ref Range Status   MRSA by PCR Next Gen NOT DETECTED NOT DETECTED Final    Comment: (NOTE) The GeneXpert MRSA Assay (FDA approved for NASAL specimens only), is one component of a comprehensive MRSA  colonization surveillance program. It is not intended to diagnose MRSA infection nor to guide or monitor treatment for MRSA infections. Test performance is not FDA approved in patients less than 59 years old. Performed at [facility], [address], [place], [place] [address]   Culture, blood (routine x 2) Call MD if unable to obtain prior to antibiotics being given     Status: None (Preliminary result)   Collection Time: 07/11/21  5:22 PM   Specimen: BLOOD  Result Value Ref Range Status   Specimen Description BLOOD RIGHT UPPER ARM  Final   Special Requests   Final    IN PEDIATRIC BOTTLE Blood Culture results may not be optimal due to an excessive volume of blood received in culture bottles   Culture   Final    NO GROWTH 3 DAYS Performed at [facility], [address] [place], [place] [address]    Report Status PENDING  Incomplete  Culture, blood (routine x 2) Call MD if unable to obtain prior to antibiotics being given     Status: None (Preliminary result)   Collection Time: 07/11/21  5:45 PM   Specimen: BLOOD LEFT HAND  Result Value Ref Range Status   Specimen Description BLOOD LEFT HAND  Final   Special Requests IN PEDIATRIC BOTTLE Blood Culture adequate volume  Final   Culture   Final    NO GROWTH 3 DAYS Performed at [facility], [address], [place], [place] [address]    Report Status PENDING  Incomplete    Meredeth Ide   [facility] If 7PM-7AM, please contact night-coverage at [email or web address], Office  [phone]   07/14/2021, 4:33 PM  LOS: 4 days

## 2021-07-14 NOTE — Progress Notes (Signed)
NAME:  Tommy Mathews, MRN:  [id number], DOB:  02/09/1961, LOS: 4 ADMISSION DATE:  07/10/2021 CONSULTATION DATE:  07/12/2021 REFERRING MD:  Loney Loh - [facility] CHIEF COMPLAINT:  Hypoxia  History of Present Illness:  Tommy Mathews is seen in consultation at the request of Dr. Loney Loh [facility]) for recommendations on further evaluation and management of progressive hypoxia in the setting of acute-on-chronic respiratory failure.  60 year old man who initially [facility] to [facility] ED 10/10 from correctional facility for sudden onset of SOB. PMHx significant for HTN, HLD, paroxysmal AF, T2DM (insulin-dependent), schizophrenia/bipolar disorder, COPD with acute-on-chronic respiratory failure (requiring 2-4L O2 therapy at baseline) and TBI with need for tracheostomy (s/p decannulation 07/07/2021).   Patient presented to [facility] ED from his correctional facility after experiencing increased SOB, dyspnea. Arrived on 15L O2, s/p Lasix administration with good diuresis He was transitioned to BiPAP. Per patient, he utilizes 2-4L HOT but does not wear this consistently. Additionally, patient was hypertensive on admission with BP 215/117 (per chart review). Reported cough and frothy sputum on admission as well as LE edema. CXR on admission showed bilateral opacities c/f pulmonary edema vs. PNA with small bilateral pleural effusions. Given ongoing dyspnea/hypoxia, LE dopplers were completed (negative for DVT) as well as CTA Chest, which was negative for PE but demonstrated findings consistent with pulmonary HTN and pulmonary edema as well as LLL opacity. Lasix BID, bronchodilators and empiric coverage for PNA was intiated.  10/12PM, patient was noted to have decreased O2 saturations to 70s despite escalation of O2 needs, though without respiratory distress. PCCM consulted for worsening hypoxia and increasing oxygen requirement. He has been incarcerated since July and awaiting court date  Pertinent Medical History:   Past Medical  History:  Diagnosis Date   Bipolar 1 disorder (HCC)    COPD (chronic obstructive pulmonary disease) (HCC)    Diabetes (HCC)    GERD (gastroesophageal reflux disease)    HLD (hyperlipidemia)    HTN (hypertension)    Schizophrenia (HCC) 01/31/2021   TBI S/p tracheostomy (decannulated 07/2021) CT angiogram 03/2021 suggestive of PE VQ scan 01/2021 neg   Significant Hospital Events: Including procedures, antibiotic start and stop dates in addition to other pertinent events   10/10 Admitted to [facility] 10/12 Worsening hypoxia despite escalation of supplemental O2 therapy, PCCM consulted for evaluation/management recs Echo 10/11 normal LV function, mild LVH, decreased RV function, RVSP could not be estimated. Venous duplex 10/11 chronic DVT right femoral vein and popliteal vein  Interim History / Subjective:   Diuresed 3 L - net NEG 16L Down to 10L high flow nasal cannula.   Objective:  Blood pressure (!) 136/93, pulse 82, temperature 98.4 F (36.9 C), temperature source Oral, resp. rate 20, height 5\' 9"  (1.753 m), weight 107.6 kg, SpO2 92 %.        Intake/Output Summary (Last 24 hours) at 07/14/2021 0926 Last data filed at 07/14/2021 0618 Gross per 24 hour  Intake 240 ml  Output 3400 ml  Net -3160 ml   Filed Weights   07/10/21 1115 07/12/21 0332 07/14/21 0500  Weight: 102.1 kg 106.4 kg 107.6 kg   Physical Examination: General: Chronically ill-appearing middle-aged man in no distress , shackled in bed with police officers in the room HEENT: [place]/AT, anicteric sclera, PERRL, dry mucous membranes. Prior tracheostomy stoma ,no secretions, no bleeding, poor phonation  Neuro: Alert, interactive, slurred speech CV: RRR, no m/g/r. PULM: resps even non labored on 10L HFNC, bibasilar crackles  GI: Soft, nontender, nondistended. Normoactive bowel sounds. Extremities: Bilateral  symmetric 1+ pitting LE edema noted. Skin: Warm/dry, seborrheic-appearing dermatitis around face/scalp.  No new  cxr 10/14  Resolved Hospital Problem List:    Assessment & Plan:  Tommy Mathews is seen in consultation at the request of Dr. Loney Loh [facility]) for recommendations on further evaluation and management of progressive hypoxia in the setting of acute-on-chronic respiratory failure after patient was noted to have decreased O2 saturations to 70s despite escalation of O2 therapy.  Acute-on-chronic hypoxemic respiratory failure HFpEF (EF 55% on Echo 10/11) Acute pulmonary edema vs. PNA History of COPD Patient presented to [facility] ED 10/10 from correctional facility after acute onset of SOB. Working diagnosis at the time of admission was flash pulmonary edema, given appearance of infiltrates on CXR, frothy sputum and hypertension. Differential initially included PE for which LE dopplers and CTA chest were completed (dopplers demonstrating chronic proximal femoral DVT, no acute thrombus; CTA chest negative for PE but demonstrating LLL opacity and pulmonary edema as well as enlarged pulmonary artery). Echo showed EF 55% with grade I diastolic dysfunction. Ongoing working diagnoses are pulmonary edema/CHF with possible superimposed PNA.  Low procalcitonin makes infection less likely -Does seem to have pulmonary hypertension with decreased RV function, although no evidence of CTEPH  PLAN -  Continue diuresis as renal function and BP tolerate  Wean O2 as able for sat > 88% Continue bronchodilators Continue empiric antibiotics although low procalcitonin makes infection less likely - would stop after D5 Pulmonary hygiene Mobilize as able    Best Practice: (right click and "Reselect all SmartList Selections" daily)   Per Primary Team  Labs:  CBC: Recent Labs  Lab 07/10/21 1036 07/10/21 1209 07/11/21 0508 07/12/21 0218 07/13/21 0645 07/14/21 0411  WBC 4.5  --  6.6 6.8 5.5 5.7  NEUTROABS 2.7  --   --  4.1 3.0 2.7  HGB 17.3* 20.1* 16.6 17.5* 18.3* 17.9*  HCT 58.6* 59.0* 55.7* 57.9* 60.9* 59.9*  MCV 91.4   --  90.0 89.4 90.2 88.5  PLT 146*  --  145* 157 166 187   Basic Metabolic Panel: Recent Labs  Lab 07/11/21 0508 07/11/21 1817 07/12/21 0218 07/13/21 0645 07/14/21 0411  NA 140 136 136 136 132*  K 3.6 4.1 5.0 3.9 3.8  CL 98 95* 90* 90* 88*  CO2 34* 32 38* 35* 35*  GLUCOSE 129* 162* 103* 76 112*  BUN 10 13 15 17  21*  CREATININE 1.15 1.30* 1.24 1.08 1.15  CALCIUM 8.2* 7.9* 8.0* 8.3* 8.4*  MG  --  1.5* 1.7 1.7 1.8   GFR: Estimated Creatinine Clearance: 83.6 mL/min (by C-G formula based on SCr of 1.15 mg/dL). Recent Labs  Lab 07/11/21 0508 07/11/21 1722 07/12/21 0218 07/13/21 0645 07/14/21 0411  PROCALCITON  --  <0.10 <0.10 <0.10  --   WBC 6.6  --  6.8 5.5 5.7   Liver Function Tests: Recent Labs  Lab 07/10/21 1036  AST 19  ALT 15  ALKPHOS 58  BILITOT 1.0  PROT 6.8  ALBUMIN 2.8*   No results for input(s): LIPASE, AMYLASE in the last 168 hours. No results for input(s): AMMONIA in the last 168 hours.  ABG:    Component Value Date/Time   PHART 7.412 07/12/2021 2254   PCO2ART 57.4 (H) 07/12/2021 2254   PO2ART 95.8 07/12/2021 2254   HCO3 35.8 (H) 07/12/2021 2254   TCO2 40 (H) 07/10/2021 1209   ACIDBASEDEF 0.3 01/13/2018 0419   O2SAT 96.2 07/12/2021 2254    Coagulation Profile: No results for input(s):  INR, PROTIME in the last 168 hours.  Cardiac Enzymes: No results for input(s): CKTOTAL, CKMB, CKMBINDEX, TROPONINI in the last 168 hours.  HbA1C: Hgb A1c MFr Bld  Date/Time Value Ref Range Status  02/01/2021 12:47 PM 7.0 (H) 4.8 - 5.6 % Final    Comment:    (NOTE) Pre diabetes:          5.7%-6.4%  Diabetes:              >6.4%  Glycemic control for   <7.0% adults with diabetes   12/15/2019 01:22 AM 6.9 (H) 4.8 - 5.6 % Final    Comment:    (NOTE) Pre diabetes:          5.7%-6.4% Diabetes:              >6.4% Glycemic control for   <7.0% adults with diabetes    Dirk Dress, NP Pulmonary/Critical Care Medicine  07/14/2021  9:26 AM

## 2021-07-15 DIAGNOSIS — I5033 Acute on chronic diastolic (congestive) heart failure: Secondary | ICD-10-CM

## 2021-07-15 DIAGNOSIS — J9621 Acute and chronic respiratory failure with hypoxia: Secondary | ICD-10-CM | POA: Diagnosis not present

## 2021-07-15 LAB — BASIC METABOLIC PANEL
Anion gap: 9 (ref 5–15)
BUN: 23 mg/dL — ABNORMAL HIGH (ref 6–20)
CO2: 34 mmol/L — ABNORMAL HIGH (ref 22–32)
Calcium: 8.8 mg/dL — ABNORMAL LOW (ref 8.9–10.3)
Chloride: 90 mmol/L — ABNORMAL LOW (ref 98–111)
Creatinine, Ser: 1.2 mg/dL (ref 0.61–1.24)
GFR, Estimated: >60 mL/min (ref >60)
Glucose, Bld: 92 mg/dL (ref 70–99)
Potassium: 4.1 mmol/L (ref 3.5–5.1)
Sodium: 133 mmol/L — ABNORMAL LOW (ref 135–145)

## 2021-07-15 LAB — GLUCOSE, CAPILLARY
Glucose-Capillary: 117 mg/dL — ABNORMAL HIGH (ref 70–99)
Glucose-Capillary: 159 mg/dL — ABNORMAL HIGH (ref 70–99)
Glucose-Capillary: 130 mg/dL — ABNORMAL HIGH (ref 70–99)
Glucose-Capillary: 192 mg/dL — ABNORMAL HIGH (ref 70–99)

## 2021-07-15 MED ORDER — FUROSEMIDE 10 MG/ML IJ SOLN
40.0000 mg | Freq: Two times a day (BID) | INTRAMUSCULAR | Status: DC
  Administered 2021-07-15 – 2021-07-16 (×2): 40 mg via INTRAVENOUS
  Filled 2021-07-15 (×2): qty 4

## 2021-07-15 NOTE — Consult Note (Signed)
Cardiology Consult Note:   Patient ID: Tommy Mathews MRN: [id number]; DOB: 05/02/1961   Admission date: 07/10/2021  PCP:  Georgann Housekeeper, MD   [facility] Providers Cardiologist:  New to [facility] Chief Complaint:  Shortness of breath  Patient Profile:   Tommy Mathews is a 60 y.o. male with history of HTN with DM, prior PAF, COPD, and chronic respiratory failure on 2-4 L O2, who initially presented with respiratory distress who is being seen 07/15/2021 for the evaluation of persistent hypoxic respiratory failure.  History of Present Illness:   Tommy Mathews has not been seen in some time by a cardiology service (last 01/12/2018) related to his present incarceration.  Previously seen 01/14/2018 for AF RVR and GI bleed for which he was taken off of DOAC at that time (now restarted and stable).  Presented 07/10/21 for hypoxic respiratory failure.   Started on BIPAP (his baseline is 4 L O2) through course has needed as much as 15 L O2.  Though course had diuresed 19 L and O2 sats have improved to initial 3-4L  Cardiology consulted for optimization of HFpEF therapies.  Patient notes that he is feeling great.  Has had no chest pain, chest pressure, chest tightness, chest stinging .  Marland Kitchen  No shortness of breath, DOE .  No PND or orthopnea.  No bendopnea, weight gain, leg swelling .  Doesn't know much of a difference despite profound diuresis.  Notes  no palpitations or funny heart beats.  When he was in PAF he noted that he just felt something wrong in his chest.   Has not felt this in some time.  Cardiology called for evaluation.   Past Medical History:  Diagnosis Date   Bipolar 1 disorder (HCC)    COPD (chronic obstructive pulmonary disease) (HCC)    Diabetes (HCC)    GERD (gastroesophageal reflux disease)    HLD (hyperlipidemia)    HTN (hypertension)    Schizophrenia (HCC) 01/31/2021    Past Surgical History:  Procedure Laterality Date   IR GASTROSTOMY TUBE REMOVAL  08/15/2018    PEG PLACEMENT N/A 01/21/2018   Procedure: PERCUTANEOUS ENDOSCOPIC GASTROSTOMY (PEG) PLACEMENT;  Surgeon: Wyline Mood, MD;  Location: [facility] ENDOSCOPY;  Service: Gastroenterology;  Laterality: N/A;   TRACHEOSTOMY TUBE PLACEMENT N/A 01/23/2018   Procedure: TRACHEOSTOMY;  Surgeon: Bud Face, MD;  Location: [facility] ORS;  Service: ENT;  Laterality: N/A;     Medications Prior to Admission: Prior to Admission medications   Medication Sig Start Date End Date Taking? Authorizing Provider  cloNIDine (CATAPRES) 0.1 MG tablet Take 0.1 mg by mouth 2 (two) times daily.   Yes [provider]  digoxin (LANOXIN) 0.25 MG tablet Take 1 tablet (0.25 mg total) by mouth daily. 09/02/19  Yes Christian, Rylee, MD  divalproex (DEPAKOTE) 250 MG DR tablet Take 3 tablets (750 mg total) by mouth 2 (two) times daily. 04/17/21 09/09/21 Yes Russella Dar, NP  FLUoxetine (PROZAC) 20 MG capsule Take 1 capsule (20 mg total) by mouth daily. 09/02/19  Yes Christian, Rylee, MD  hydrALAZINE (APRESOLINE) 25 MG tablet Take 1 tablet (25 mg total) by mouth every 8 (eight) hours. 04/17/21 09/09/21 Yes Arrien, York Ram, MD  insulin glargine (LANTUS) 100 UNIT/ML injection Inject 0.1 mLs (10 Units total) into the skin daily. Patient taking differently: Inject 10 Units into the skin at bedtime. 04/18/21  Yes Russella Dar, NP  insulin regular (NOVOLIN R) 100 units/mL injection Inject 2-12 Units into the skin See admin instructions. Three times  daily per sliding scale   Yes [provider]  QUEtiapine (SEROQUEL) 200 MG tablet Take 1 tablet (200 mg total) by mouth at bedtime. 09/01/19  Yes Christian, Rylee, MD  QUEtiapine (SEROQUEL) 50 MG tablet Take 1 tablet (50 mg total) by mouth in the morning. 04/17/21 09/09/21 Yes Russella Dar, NP  apixaban (ELIQUIS) 5 MG TABS tablet Take 2 tablets twice daily through 04/19/21 then start taking 1 tablet twice daily on 04/20/21 Patient not taking: No sig reported 04/17/21   Russella Dar, NP  clonazePAM (KLONOPIN) 0.5 MG tablet Take 0.5 tablets (0.25 mg total) by mouth 2 (two) times daily. Patient not taking: No sig reported 03/30/21   Arrien, York Ram, MD     Allergies:   No Known Allergies  Social History:   Social History   Socioeconomic History   Marital status: Single    Spouse name: Not on file   Number of children: Not on file   Years of education: Not on file   Highest education level: Not on file  Occupational History   Not on file  Tobacco Use   Smoking status: Every Day    Packs/day: 1.00    Years: 40.00    Pack years: 40.00    Types: Cigarettes    Start date: 2000   Smokeless tobacco: Never  Vaping Use   Vaping Use: Never used  Substance and Sexual Activity   Alcohol use: Yes   Drug use: Never   Sexual activity: Never  Other Topics Concern   Not on file  Social History Narrative   Patient's sister states patient needs assistance with life management (bills, taking care of house,etc)the patient is wheelchair bound but can stand and walk around house short distances. Pt lived in a group home setting prior to hospital. Pt's mother is next of kin. Tammy Sours rn   Social Determinants of Health   Financial Resource Strain: Not on file  Food Insecurity: Not on file  Transportation Needs: Not on file  Physical Activity: Not on file  Stress: Not on file  Social Connections: Not on file  Intimate Partner Violence: Not on file    Family History:   The patient's family history is not on file.   History of coronary artery disease notable for no members. History of heart failure notable for no members. History of arrhythmia notable for no members.  ROS:  Please see the history of present illness.  All other ROS reviewed and negative.     Physical Exam/Data:   Vitals:   07/15/21 0829 07/15/21 0910 07/15/21 1130 07/15/21 1608  BP:   126/89 113/77  Pulse: 82  86 88  Resp:   (!) 23 (!) 23  Temp:  97.6 F (36.4 C) 97.8 F  (36.6 C) 98.4 F (36.9 C)  TempSrc:   Oral Oral  SpO2:   93% 93%  Weight:      Height:        Intake/Output Summary (Last 24 hours) at 07/15/2021 1731 Last data filed at 07/15/2021 1617 Gross per 24 hour  Intake 720 ml  Output 5050 ml  Net -4330 ml   Last 3 Weights 07/15/2021 07/14/2021 07/12/2021  Weight (lbs) 218 lb 11.1 oz 237 lb 3.4 oz 234 lb 9.1 oz  Weight (kg) 99.2 kg 107.6 kg 106.4 kg     Body mass index is 32.3 kg/m.  General:  Well nourished, well developed, in no acute distress HEENT: normal Neck: no JVD Vascular:  No carotid bruits; Distal pulses 2+ bilaterally   Cardiac:  normal S1, S2; RRR; no murmur  Lungs:  Poor air movement with expiratory wheezes Abd: soft, nontender, no hepatomegaly  Ext: non pitting edema Musculoskeletal:  No deformities, BUE and BLE strength normal and equal Skin: warm and dry  Neuro:  CNs 2-12 intact, no focal abnormalities noted Psych:  Responds well to questions but limited   EKG:  The ECG that was done  was personally reviewed and demonstrates sinus rhythm rate 76 iRBBB nonspecific TWI  Relevant CV Studies:  Transthoracic Echocardiogram: Date: 07/11/21 Results: Difficult study (Patient still in handcuffs and unable to cooperate), estimated LA pressure 15 mm Hg 1. Left ventricular ejection fraction, by estimation, is 50-55%. The left  ventricle has low-normal function. Left ventricular endocardial border not  optimally defined to evaluate regional wall motion. There is mild  concentric left ventricular hypertrophy. Left   ventricular diastolic parameters are consistent with Grade I diastolic  dysfunction (impaired relaxation).   2. Right ventricular systolic function is mildly reduced. The right  ventricular size is mildly enlarged. Tricuspid regurgitation signal is  inadequate for assessing PA pressure.   3. A small pericardial effusion is present. The pericardial effusion is  surrounding the apex. No evidence of increased  pericardial pressure.   4. The mitral valve is normal in structure. No evidence of mitral valve  regurgitation.   5. The aortic valve is tricuspid. Aortic valve regurgitation is not  visualized. No aortic stenosis is present.   Laboratory Data:  High Sensitivity Troponin:   Recent Labs  Lab 07/10/21 1036  TROPONINIHS 31*      Chemistry Recent Labs  Lab 07/13/21 0645 07/14/21 0411 07/15/21 0341  NA 136 132* 133*  K 3.9 3.8 4.1  CL 90* 88* 90*  CO2 35* 35* 34*  GLUCOSE 76 112* 92  BUN 17 21* 23*  CREATININE 1.08 1.15 1.20  CALCIUM 8.3* 8.4* 8.8*  MG 1.7 1.8  --   GFRNONAA >60 >60 >60  ANIONGAP 11 9 9     Recent Labs  Lab 07/10/21 1036  PROT 6.8  ALBUMIN 2.8*  AST 19  ALT 15  ALKPHOS 58  BILITOT 1.0   Lipids No results for input(s): CHOL, TRIG, HDL, LABVLDL, LDLCALC, CHOLHDL in the last 168 hours. Hematology Recent Labs  Lab 07/13/21 0645 07/14/21 0411  WBC 5.5 5.7  RBC 6.75* 6.77*  HGB 18.3* 17.9*  HCT 60.9* 59.9*  MCV 90.2 88.5  MCH 27.1 26.4  MCHC 30.0 29.9*  RDW 20.0* 19.9*  PLT 166 187   Thyroid No results for input(s): TSH, FREET4 in the last 168 hours. BNP Recent Labs  Lab 07/10/21 1050  BNP 1,316.0*    DDimer No results for input(s): DDIMER in the last 168 hours.  Radiology/Studies:  No results found.  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

## 2021-07-15 NOTE — Progress Notes (Addendum)
[facility]  PROGRESS NOTE  Gaither Biehn LFY:[id number] DOB: 1961/05/10 DOA: 07/10/2021 PCP: Georgann Housekeeper, MD   Brief HPI:   60 year old male with past medical history of traumatic brain injury, prior tracheostomy, hypertension, hyperlipidemia, PAF, diabetes mellitus type 2, COPD, chronic respiratory failure on oxygen 2 to 4 L/min, bipolar disorder was hypotensive on admission and in respiratory distress.  Started on BiPAP and nitroglycerin drip on admission.  He is diuresing well with IV Lasix.    Subjective   Patient seen and examined, breathing is significantly improved.  Now requiring 4 L of oxygen via nasal cannula.  PCCM has signed off.   Assessment/Plan:    Acute on chronic hypoxemic respiratory failure -Likely combination of acute on chronic diastolic CHF, hypertensive emergency, community-acquired pneumonia -Treated with BiPAP -Currently requiring oxygen HFNC 30 L/min -He was tachypneic on admission, chest x-ray and CT scan showed multifocal pneumonia and also atelectasis -Continue po antibiotics; doxycycline -PCCM has signed off  Acute on chronic diastolic CHF -Diuresing well  with  Lasix 80 mg IV twice daily -BUN/creatinine slightly rising, we will cut down the dose of Lasix to 40 mg IV every 12 hours -Net -19.8 L -Echocardiogram showed diastolic dysfunction -We will consult cardiology   Hypertensive emergency  -Blood pressure has improved -He was started on nitroglycerin drip; which has been discontinued -Currently on clonidine 0.1 mg 3 times daily, Imdur, hydralazine and Lasix  History of DVT/PE -Proximal 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 -Continue Eliquis 5 mg twice daily  Paroxysmal atrial fibrillation -Heart rate controlled -He has documented history of PAF -Continue digoxin -Continue Eliquis as above  Mood disorder -History of schizophrenia/bipolar disorder -Continue Seroquel  Diabetes mellitus type 2 -Continue  sliding scale insulin with NovoLog, Levemir  History of tracheostomy -Still has stoma -Respiratory therapy consulted   Scheduled medications:    apixaban  5 mg Oral BID   cloNIDine  0.1 mg Oral TID   digoxin  0.25 mg Oral Daily   divalproex  750 mg Oral BID   doxycycline  100 mg Oral Q12H   FLUoxetine  20 mg Oral Daily   furosemide  80 mg Intravenous BID   hydrALAZINE  50 mg Oral Q8H   insulin aspart  0-5 Units Subcutaneous QHS   insulin aspart  0-9 Units Subcutaneous TID WC   insulin detemir  10 Units Subcutaneous QHS   ipratropium  0.5 mg Nebulization BID   isosorbide mononitrate  30 mg Oral Daily   QUEtiapine  200 mg Oral QHS   QUEtiapine  50 mg Oral Daily     Data Reviewed:   CBG:  Recent Labs  Lab 07/14/21 1115 07/14/21 1650 07/14/21 2051 07/15/21 0755 07/15/21 1125  GLUCAP 217* 112* 197* 117* 159*    SpO2: 93 % O2 Flow Rate (L/min): 4 L/min FiO2 (%): 50 %    Vitals:   07/15/21 0815 07/15/21 0829 07/15/21 0910 07/15/21 1130  BP:    126/89  Pulse:  82  86  Resp:    (!) 23  Temp:   97.6 F (36.4 C) 97.8 F (36.6 C)  TempSrc:    Oral  SpO2: 90%   93%  Weight:      Height:         Intake/Output Summary (Last 24 hours) at 07/15/2021 1542 Last data filed at 07/15/2021 0300 Gross per 24 hour  Intake 360 ml  Output 3250 ml  Net -2890 ml    10/13 1901 -  10/15 0700 In: 1080 [P.O.:1080] Out: 6200 [Urine:6200]  Filed Weights   07/12/21 0332 07/14/21 0500 07/15/21 0428  Weight: 106.4 kg 107.6 kg 99.2 kg    Data Reviewed: Basic Metabolic Panel: Recent Labs  Lab 07/11/21 1817 07/12/21 0218 07/13/21 0645 07/14/21 0411 07/15/21 0341  NA 136 136 136 132* 133*  K 4.1 5.0 3.9 3.8 4.1  CL 95* 90* 90* 88* 90*  CO2 32 38* 35* 35* 34*  GLUCOSE 162* 103* 76 112* 92  BUN 13 15 17  21* 23*  CREATININE 1.30* 1.24 1.08 1.15 1.20  CALCIUM 7.9* 8.0* 8.3* 8.4* 8.8*  MG 1.5* 1.7 1.7 1.8  --    Liver Function Tests: Recent Labs  Lab 07/10/21 1036   AST 19  ALT 15  ALKPHOS 58  BILITOT 1.0  PROT 6.8  ALBUMIN 2.8*   No results for input(s): LIPASE, AMYLASE in the last 168 hours. No results for input(s): AMMONIA in the last 168 hours. CBC: Recent Labs  Lab 07/10/21 1036 07/10/21 1209 07/11/21 0508 07/12/21 0218 07/13/21 0645 07/14/21 0411  WBC 4.5  --  6.6 6.8 5.5 5.7  NEUTROABS 2.7  --   --  4.1 3.0 2.7  HGB 17.3* 20.1* 16.6 17.5* 18.3* 17.9*  HCT 58.6* 59.0* 55.7* 57.9* 60.9* 59.9*  MCV 91.4  --  90.0 89.4 90.2 88.5  PLT 146*  --  145* 157 166 187   Cardiac Enzymes: No results for input(s): CKTOTAL, CKMB, CKMBINDEX, TROPONINI in the last 168 hours. BNP (last 3 results) Recent Labs    02/06/21 1215 03/21/21 1111 07/10/21 1050  BNP 28.6 263.8* 1,316.0*    ProBNP (last 3 results) No results for input(s): PROBNP in the last 8760 hours.  CBG: Recent Labs  Lab 07/14/21 1115 07/14/21 1650 07/14/21 2051 07/15/21 0755 07/15/21 1125  GLUCAP 217* 112* 197* 117* 159*       Radiology Reports  No results found.     Antibiotics: Anti-infectives (From admission, onward)    Start     Dose/Rate Route Frequency Ordered Stop   07/11/21 2200  doxycycline (VIBRA-TABS) tablet 100 mg        100 mg Oral Every 12 hours 07/11/21 1903 07/15/21 2359   07/11/21 1800  cefTRIAXone (ROCEPHIN) 2 g in sodium chloride 0.9 % 100 mL IVPB        2 g 200 mL/hr over 30 Minutes Intravenous Every 24 hours 07/11/21 1655 07/16/21 1759   07/11/21 1745  azithromycin (ZITHROMAX) tablet 500 mg  Status:  Discontinued        500 mg Oral Daily 07/11/21 1655 07/11/21 1903         DVT prophylaxis: Apixaban  Code Status: Full code  Family Communication: No family at bedside   Consultants:   Procedures:     Objective    Physical Examination:  General-appears in no acute distress Heart-S1-S2, regular, no murmur auscultated Lungs-decreased breath sounds bilaterally Abdomen-soft, nontender, no  organomegaly Extremities-no edema in the lower extremities Neuro-alert, oriented x3, no focal deficit noted  Status is: Inpatient  Dispo: The patient is from: correctional facility              Anticipated d/c is to: correctional facility              Anticipated d/c date is:07/18/21              Patient currently not stable for discharge  Barrier to discharge- ongoing dyspnea  COVID-19 Labs  No results for  input(s): DDIMER, FERRITIN, LDH, CRP in the last 72 hours.  Lab Results  Component Value Date   SARSCOV2NAA NEGATIVE 07/10/2021   SARSCOV2NAA NEGATIVE 04/04/2021   SARSCOV2NAA NEGATIVE 03/30/2021   SARSCOV2NAA NEGATIVE 03/21/2021            Recent Results (from the past 240 hour(s))  Resp Panel by RT-PCR (Flu A&B, Covid) Nasopharyngeal Swab     Status: None   Collection Time: 07/10/21 10:37 AM   Specimen: Nasopharyngeal Swab; Nasopharyngeal(NP) swabs in vial transport medium  Result Value Ref Range Status   SARS Coronavirus 2 by RT PCR NEGATIVE NEGATIVE Final    Comment: (NOTE) SARS-CoV-2 target nucleic acids are NOT DETECTED.  The SARS-CoV-2 RNA is generally detectable in upper respiratory specimens during the acute phase of infection. The lowest concentration of SARS-CoV-2 viral copies this assay can detect is 138 copies/mL. A negative result does not preclude SARS-Cov-2 infection and should not be used as the sole basis for treatment or other patient management decisions. A negative result may occur with  improper specimen collection/handling, submission of specimen other than nasopharyngeal swab, presence of viral mutation(s) within the areas targeted by this assay, and inadequate number of viral copies(<138 copies/mL). A negative result must be combined with clinical observations, patient history, and epidemiological information. The expected result is Negative.  Fact Sheet for Patients:  BloggerCourse.com  Fact Sheet for  Healthcare Providers:  SeriousBroker.it  This test is no t yet approved or cleared by the [place] FDA and  has been authorized for detection and/or diagnosis of SARS-CoV-2 by FDA under an Emergency Use Authorization (EUA). This EUA will remain  in effect (meaning this test can be used) for the duration of the COVID-19 declaration under Section 564(b)(1) of the Act, 21 U.S.C.section 360bbb-3(b)(1), unless the authorization is terminated  or revoked sooner.       Influenza A by PCR NEGATIVE NEGATIVE Final   Influenza B by PCR NEGATIVE NEGATIVE Final    Comment: (NOTE) The Xpert Xpress SARS-CoV-2/FLU/RSV plus assay is intended as an aid in the diagnosis of influenza from Nasopharyngeal swab specimens and should not be used as a sole basis for treatment. Nasal washings and aspirates are unacceptable for Xpert Xpress SARS-CoV-2/FLU/RSV testing.  Fact Sheet for Patients: BloggerCourse.com  Fact Sheet for Healthcare Providers: SeriousBroker.it  This test is not yet approved or cleared by the [place] FDA and has been authorized for detection and/or diagnosis of SARS-CoV-2 by FDA under an Emergency Use Authorization (EUA). This EUA will remain in effect (meaning this test can be used) for the duration of the COVID-19 declaration under Section 564(b)(1) of the Act, 21 U.S.C. section 360bbb-3(b)(1), unless the authorization is terminated or revoked.  Performed at [facility], [address] [place], [place] [address]   MRSA Next Gen by PCR, Nasal     Status: None   Collection Time: 07/11/21  1:00 PM   Specimen: Nasal Mucosa; Nasal Swab  Result Value Ref Range Status   MRSA by PCR Next Gen NOT DETECTED NOT DETECTED Final    Comment: (NOTE) The GeneXpert MRSA Assay (FDA approved for NASAL specimens only), is one component of a comprehensive MRSA colonization surveillance program. It is not  intended to diagnose MRSA infection nor to guide or monitor treatment for MRSA infections. Test performance is not FDA approved in patients less than 24 years old. Performed at [facility], [address], [place], [place] [address]   Culture, blood (routine  x 2) Call MD if unable to obtain prior to antibiotics being given     Status: None (Preliminary result)   Collection Time: 07/11/21  5:22 PM   Specimen: BLOOD  Result Value Ref Range Status   Specimen Description BLOOD RIGHT UPPER ARM  Final   Special Requests   Final    IN PEDIATRIC BOTTLE Blood Culture results may not be optimal due to an excessive volume of blood received in culture bottles   Culture   Final    NO GROWTH 4 DAYS Performed at [facility], [address], [place], [place] [address]    Report Status PENDING  Incomplete  Culture, blood (routine x 2) Call MD if unable to obtain prior to antibiotics being given     Status: None (Preliminary result)   Collection Time: 07/11/21  5:45 PM   Specimen: BLOOD LEFT HAND  Result Value Ref Range Status   Specimen Description BLOOD LEFT HAND  Final   Special Requests IN PEDIATRIC BOTTLE Blood Culture adequate volume  Final   Culture   Final    NO GROWTH 4 DAYS Performed at [facility], [address] [place], [place] [address]    Report Status PENDING  Incomplete    Meredeth Ide   [facility] If 7PM-7AM, please contact night-coverage at [email or web address], Office  [phone]   07/15/2021, 3:42 PM  LOS: 5 days

## 2021-07-15 NOTE — Progress Notes (Addendum)
NAME:  Tommy Mathews, MRN:  [id number], DOB:  1961/02/05, LOS: 5 ADMISSION DATE:  07/10/2021 CONSULTATION DATE:  07/12/2021 REFERRING MD:  Loney Loh - [facility] CHIEF COMPLAINT:  Hypoxia  History of Present Illness:  Tommy Mathews is seen in consultation at the request of Dr. Loney Loh [facility]) for recommendations on further evaluation and management of progressive hypoxia in the setting of acute-on-chronic respiratory failure.  60 year old man who initially presented to [facility] ED 10/10 from correctional facility for sudden onset of SOB. PMHx significant for HTN, HLD, paroxysmal AF, T2DM (insulin-dependent), schizophrenia/bipolar disorder, COPD with acute-on-chronic respiratory failure (requiring 2-4L O2 therapy at baseline) and TBI with need for tracheostomy (s/p decannulation 07/07/2021).   Patient presented to [facility] ED from his correctional facility after experiencing increased SOB, dyspnea. Arrived on 15L O2, s/p Lasix administration with good diuresis He was transitioned to BiPAP. Per patient, he utilizes 2-4L O2 but does not wear this consistently. Additionally, patient was hypertensive on admission with BP 215/117 (per chart review). Reported cough and frothy sputum on admission as well as LE edema. CXR on admission showed bilateral opacities c/f pulmonary edema vs. PNA with small bilateral pleural effusions. Given ongoing dyspnea/hypoxia, LE dopplers were completed (negative for DVT) as well as CTA Chest, which was negative for PE but demonstrated findings consistent with pulmonary HTN and pulmonary edema as well as LLL opacity. Lasix BID, bronchodilators and empiric coverage for PNA was intiated.  10/12PM, patient was noted to have decreased O2 saturations to 70s despite escalation of O2 needs, though without respiratory distress. PCCM consulted for worsening hypoxia and increasing oxygen requirement. He has been incarcerated since July and awaiting court date  Pertinent Medical History:  Bipolar disorder /  Schizophrenia  COPD DM  GERD HLD  HTN TBI S/p tracheostomy (decannulated 07/2021) CT angiogram 03/2021 suggestive of PE VQ scan 01/2021 neg   Significant Hospital Events: Including procedures, antibiotic start and stop dates in addition to other pertinent events   10/10 Admitted to [facility] 10/12 Worsening hypoxia despite escalation of supplemental O2 therapy, PCCM consulted for evaluation/management recs Echo 10/11 normal LV function, mild LVH, decreased RV function, RVSP could not be estimated. Venous duplex 10/11 chronic DVT right femoral vein and popliteal vein 10/14 Diuresed 3L, net neg 16L since admit, down to 10L O2   Interim History / Subjective:  Afebrile  I/O 4.8L UOP, -3.7L in last 24h O2 weaned to 4L, reported patients baseline Pt asking to go home  RT reports she found the patient with O2 off with sats in the low 80's  Objective:  Blood pressure 138/86, pulse 87, temperature 98.2 F (36.8 C), temperature source Oral, resp. rate 13, height 5\' 9"  (1.753 m), weight 99.2 kg, SpO2 92 %.        Intake/Output Summary (Last 24 hours) at 07/15/2021 0803 Last data filed at 07/15/2021 0300 Gross per 24 hour  Intake 1080 ml  Output 4800 ml  Net -3720 ml   Filed Weights   07/12/21 0332 07/14/21 0500 07/15/21 0428  Weight: 106.4 kg 107.6 kg 99.2 kg   Physical Examination: General: adult male sitting up in bed in NAD, breathing treatment in progress HEENT: MM pink/moist Neuro: Awake, alert, oriented to self, place 07/17/21.  Hx TBI CV: s1s2 RRR, no m/r/g PULM: non-labored at rest, lungs bilaterally clear anterior, diminished bases. Trach stoma site open / small leak from site GI: soft, bsx4 active  Extremities: warm/dry, no edema, ankle shackles in place  Skin: no rashes or lesions  Resolved  Hospital Problem List:    Assessment & Plan:  Tommy Mathews is seen in consultation at the request of Dr. Loney Loh [facility]) for recommendations on further evaluation and management of  progressive hypoxia in the setting of acute-on-chronic respiratory failure after patient was noted to have decreased O2 saturations to 70s despite escalation of O2 therapy.  Acute-on-chronic hypoxemic respiratory failure HFpEF (EF 55% on Echo 10/11) Acute pulmonary edema vs. PNA History of COPD DVT  Patient presented to [facility] ED 10/10 from correctional facility after acute onset of SOB. Working diagnosis at the time of admission was flash pulmonary edema, given appearance of infiltrates on CXR, frothy sputum and hypertension. Differential initially included PE for which LE dopplers and CTA chest were completed (dopplers demonstrating chronic proximal femoral DVT, no acute thrombus; CTA chest negative for PE but demonstrating LLL opacity and pulmonary edema as well as enlarged pulmonary artery). Echo showed EF 55% with grade I diastolic dysfunction. Ongoing working diagnoses are pulmonary edema/CHF with possible superimposed PNA.  Low procalcitonin makes infection less likely. Does seem to have pulmonary hypertension with decreased RV function, although no evidence of CTEPH.  -wean O2 for sats >88% -pulmonary hygiene - IS, mobilize -continue lasix 80mg  BID for negative balance as renal function / BP permit  -follow daily weights -chest PT / pulmonary hygiene -empiric rocephin D5/5 -continue eliquis  -continue clonidine, hydralazine, imdur, digoxin -PRN albuterol  -follow intermittent CXR   Nearing pulmonary baseline, may be able to return to facility 10/16, defer to [facility]. PCCM will be available PRN.  Best Practice: (right click and "Reselect all SmartList Selections" daily)  Per Primary Team       [facility], MSN, APRN, NP-C, AGACNP-BC [facility] 07/15/2021, 8:04 AM   Please see [email or web address] for pager details.   From 7A-7P if no response, please call [phone] After hours, please call ELink [phone]

## 2021-07-15 NOTE — Plan of Care (Signed)

## 2021-07-16 ENCOUNTER — Inpatient Hospital Stay (HOSPITAL_COMMUNITY): Payer: Medicaid Other

## 2021-07-16 DIAGNOSIS — R0609 Other forms of dyspnea: Secondary | ICD-10-CM | POA: Diagnosis not present

## 2021-07-16 DIAGNOSIS — J9601 Acute respiratory failure with hypoxia: Secondary | ICD-10-CM | POA: Diagnosis not present

## 2021-07-16 DIAGNOSIS — J9621 Acute and chronic respiratory failure with hypoxia: Secondary | ICD-10-CM | POA: Diagnosis not present

## 2021-07-16 DIAGNOSIS — J810 Acute pulmonary edema: Secondary | ICD-10-CM | POA: Diagnosis not present

## 2021-07-16 DIAGNOSIS — I5033 Acute on chronic diastolic (congestive) heart failure: Secondary | ICD-10-CM | POA: Diagnosis not present

## 2021-07-16 LAB — GLUCOSE, CAPILLARY
Glucose-Capillary: 200 mg/dL — ABNORMAL HIGH (ref 70–99)
Glucose-Capillary: 121 mg/dL — ABNORMAL HIGH (ref 70–99)
Glucose-Capillary: 106 mg/dL — ABNORMAL HIGH (ref 70–99)
Glucose-Capillary: 146 mg/dL — ABNORMAL HIGH (ref 70–99)

## 2021-07-16 LAB — BASIC METABOLIC PANEL
Anion gap: 18 — ABNORMAL HIGH (ref 5–15)
BUN: 28 mg/dL — ABNORMAL HIGH (ref 6–20)
CO2: 24 mmol/L (ref 22–32)
Calcium: 8.8 mg/dL — ABNORMAL LOW (ref 8.9–10.3)
Chloride: 92 mmol/L — ABNORMAL LOW (ref 98–111)
Creatinine, Ser: 1.23 mg/dL (ref 0.61–1.24)
GFR, Estimated: >60 mL/min (ref >60)
Glucose, Bld: 157 mg/dL — ABNORMAL HIGH (ref 70–99)
Potassium: 5.4 mmol/L — ABNORMAL HIGH (ref 3.5–5.1)
Sodium: 134 mmol/L — ABNORMAL LOW (ref 135–145)

## 2021-07-16 LAB — CULTURE, BLOOD (ROUTINE X 2)
Culture: NO GROWTH
Culture: NO GROWTH
Special Requests: ADEQUATE

## 2021-07-16 LAB — ECHOCARDIOGRAM LIMITED
Height: 69 in
Weight: 3499.14 oz

## 2021-07-16 LAB — BRAIN NATRIURETIC PEPTIDE: B Natriuretic Peptide: 26.5 pg/mL (ref 0.0–100.0)

## 2021-07-16 MED ORDER — FUROSEMIDE 40 MG PO TABS
40.0000 mg | ORAL_TABLET | Freq: Two times a day (BID) | ORAL | Status: DC
  Administered 2021-07-16 – 2021-07-17 (×2): 40 mg via ORAL
  Filled 2021-07-16 (×2): qty 1

## 2021-07-16 MED ORDER — PERFLUTREN LIPID MICROSPHERE
1.0000 mL | INTRAVENOUS | Status: AC | PRN
  Administered 2021-07-16: 4 mL via INTRAVENOUS
  Filled 2021-07-16: qty 10

## 2021-07-16 MED ORDER — SODIUM ZIRCONIUM CYCLOSILICATE 5 G PO PACK
5.0000 g | PACK | Freq: Once | ORAL | Status: AC
  Administered 2021-07-16: 5 g via ORAL
  Filled 2021-07-16 (×2): qty 1

## 2021-07-16 NOTE — Progress Notes (Signed)
  Echocardiogram 2D Echocardiogram has been performed.  Tommy Mathews L [name] 07/16/2021, 9:13 AM 

## 2021-07-16 NOTE — Progress Notes (Addendum)
[facility]  PROGRESS NOTE  [name] SWF:[id number] DOB: 1961-08-08 DOA: 07/10/2021 PCP: Georgann Housekeeper, MD   Brief HPI:   60 year old male with past medical history of traumatic brain injury, prior tracheostomy, hypertension, hyperlipidemia, PAF, diabetes mellitus type 2, COPD, chronic respiratory failure on oxygen 2 to 4 L/min, bipolar disorder was hypotensive on admission and in respiratory distress.  Started on BiPAP and nitroglycerin drip on admission.  He is diuresing well with IV Lasix.   Subjective   Patient seen and examined, breathing significantly improved.   Assessment/Plan:    Acute on chronic hypoxemic respiratory failure -Likely combination of acute on chronic diastolic CHF, hypertensive emergency, community-acquired pneumonia -Treated with BiPAP Initially required oxygen HFNC 15 L/min, has been weaned off to 3 L/min via nasal cannula. -Diuresed well with Lasix, is net -24 L -He was tachypneic on admission, chest x-ray and CT scan showed multifocal pneumonia and also atelectasis -Continue po antibiotics; doxycycline -PCCM has signed off  Acute on chronic diastolic CHF -He was initially started on Lasix 80 mg IV twice daily -Lasix dose was changed to 40 mg IV twice daily due to rising BUN/creatinine -Net - 24 L -Echocardiogram showed diastolic dysfunction -Cardiology consulted, dose of IV Lasix changed to Lasix 40 mg p.o. twice daily  Hyperkalemia -Potassium 5.4 this morning -We will give 1 dose of Lokelma 5 g p.o. x1 -Follow potassium level in a.m.  Hypertensive emergency  -Blood pressure has improved -He was started on nitroglycerin drip; which has been discontinued -Currently on clonidine 0.1 mg 3 times daily, Imdur, hydralazine and Lasix  History of DVT/PE -Proximal 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 -Continue Eliquis 5 mg twice daily  Paroxysmal atrial fibrillation -Heart rate controlled -He has documented history  of PAF -Continue digoxin -Continue Eliquis as above  Mood disorder -History of schizophrenia/bipolar disorder -Continue Seroquel, Depakote  Diabetes mellitus type 2 -Continue sliding scale insulin with NovoLog, Levemir  History of tracheostomy -Still has stoma -Respiratory therapy consulted   Scheduled medications:    apixaban  5 mg Oral BID   cloNIDine  0.1 mg Oral TID   digoxin  0.25 mg Oral Daily   divalproex  750 mg Oral BID   FLUoxetine  20 mg Oral Daily   furosemide  40 mg Oral BID   hydrALAZINE  50 mg Oral Q8H   insulin aspart  0-5 Units Subcutaneous QHS   insulin aspart  0-9 Units Subcutaneous TID WC   insulin detemir  10 Units Subcutaneous QHS   ipratropium  0.5 mg Nebulization BID   isosorbide mononitrate  30 mg Oral Daily   QUEtiapine  200 mg Oral QHS   QUEtiapine  50 mg Oral Daily     Data Reviewed:   CBG:  Recent Labs  Lab 07/15/21 1125 07/15/21 1611 07/15/21 1941 07/16/21 0754 07/16/21 1243  GLUCAP 159* 130* 192* 200* 121*    SpO2: 92 % O2 Flow Rate (L/min): 3 L/min FiO2 (%): 50 %    Vitals:   07/16/21 0657 07/16/21 0800 07/16/21 0830 07/16/21 0957  BP: (!) 127/91 (!) 158/93    Pulse:  79 86   Resp:  20    Temp:  98.2 F (36.8 C)    TempSrc:  Oral    SpO2:    92%  Weight:      Height:         Intake/Output Summary (Last 24 hours) at 07/16/2021 1416 Last data filed at 07/16/2021 1013 Gross per  24 hour  Intake --  Output 4800 ml  Net -4800 ml    10/14 1901 - 10/16 0700 In: 360 [P.O.:360] Out: 6450 [Urine:6450]  Filed Weights   07/12/21 0332 07/14/21 0500 07/15/21 0428  Weight: 106.4 kg 107.6 kg 99.2 kg    Data Reviewed: Basic Metabolic Panel: Recent Labs  Lab 07/11/21 1817 07/12/21 0218 07/13/21 0645 07/14/21 0411 07/15/21 0341 07/16/21 0818  NA 136 136 136 132* 133* 134*  K 4.1 5.0 3.9 3.8 4.1 5.4*  CL 95* 90* 90* 88* 90* 92*  CO2 32 38* 35* 35* 34* 24  GLUCOSE 162* 103* 76 112* 92 157*  BUN 13 15 17  21*  23* 28*  CREATININE 1.30* 1.24 1.08 1.15 1.20 1.23  CALCIUM 7.9* 8.0* 8.3* 8.4* 8.8* 8.8*  MG 1.5* 1.7 1.7 1.8  --   --    Liver Function Tests: Recent Labs  Lab 07/10/21 1036  AST 19  ALT 15  ALKPHOS 58  BILITOT 1.0  PROT 6.8  ALBUMIN 2.8*   No results for input(s): LIPASE, AMYLASE in the last 168 hours. No results for input(s): AMMONIA in the last 168 hours. CBC: Recent Labs  Lab 07/10/21 1036 07/10/21 1209 07/11/21 0508 07/12/21 0218 07/13/21 0645 07/14/21 0411  WBC 4.5  --  6.6 6.8 5.5 5.7  NEUTROABS 2.7  --   --  4.1 3.0 2.7  HGB 17.3* 20.1* 16.6 17.5* 18.3* 17.9*  HCT 58.6* 59.0* 55.7* 57.9* 60.9* 59.9*  MCV 91.4  --  90.0 89.4 90.2 88.5  PLT 146*  --  145* 157 166 187   Cardiac Enzymes: No results for input(s): CKTOTAL, CKMB, CKMBINDEX, TROPONINI in the last 168 hours. BNP (last 3 results) Recent Labs    03/21/21 1111 07/10/21 1050 07/16/21 0818  BNP 263.8* 1,316.0* 26.5    ProBNP (last 3 results) No results for input(s): PROBNP in the last 8760 hours.  CBG: Recent Labs  Lab 07/15/21 1125 07/15/21 1611 07/15/21 1941 07/16/21 0754 07/16/21 1243  GLUCAP 159* 130* 192* 200* 121*       Radiology Reports  ECHOCARDIOGRAM LIMITED  Result Date: 07/16/2021    ECHOCARDIOGRAM LIMITED REPORT   Patient Name:   Tommy Mathews Date of Exam: 07/16/2021 Medical Rec #:  07/18/2021     Height:       69.0 in Accession #:    [id number]    Weight:       218.7 lb Date of Birth:  15-May-1961     BSA:          2.146 m Patient Age:    60 years      BP:           127/91 mmHg Patient Gender: M             HR:           79 bpm. Exam Location:  Inpatient Procedure: Limited Echo Indications:    Dyspnea  History:        Patient has prior history of Echocardiogram examinations, most                 recent 07/11/2021. CHF, COPD, Signs/Symptoms:Shortness of                 Breath; Risk Factors:Diabetes.  Sonographer:    09/10/2021 Referring Phys: Mikki Harbor MAHESH A  CHANDRASEKHAR IMPRESSIONS  1. Left ventricular ejection fraction, by estimation, is 55 to 60%. The left ventricle has normal function. Left ventricular endocardial  border not optimally defined to evaluate regional wall motion despite the use of Definity, however regional wall motion appears grossly normal. FINDINGS  Left Ventricle: Left ventricular ejection fraction, by estimation, is 55 to 60%. The left ventricle has normal function. Left ventricular endocardial border not optimally defined to evaluate regional wall motion. Definity contrast agent was given IV to delineate the left ventricular endocardial borders. Weston Brass MD Electronically signed by Weston Brass MD Signature Date/Time: 07/16/2021/1:08:00 PM    Final        Antibiotics: Anti-infectives (From admission, onward)    Start     Dose/Rate Route Frequency Ordered Stop   07/11/21 2200  doxycycline (VIBRA-TABS) tablet 100 mg        100 mg Oral Every 12 hours 07/11/21 1903 07/15/21 2005   07/11/21 1800  cefTRIAXone (ROCEPHIN) 2 g in sodium chloride 0.9 % 100 mL IVPB        2 g 200 mL/hr over 30 Minutes Intravenous Every 24 hours 07/11/21 1655 07/15/21 1657   07/11/21 1745  azithromycin (ZITHROMAX) tablet 500 mg  Status:  Discontinued        500 mg Oral Daily 07/11/21 1655 07/11/21 1903         DVT prophylaxis: Apixaban  Code Status: Full code  Family Communication: No family at bedside   Consultants:   Procedures:     Objective    Physical Examination:  General-appears in no acute distress Heart-S1-S2, regular, no murmur auscultated Lungs-decreased breath sounds bilaterally Abdomen-soft, nontender, no organomegaly Extremities-no edema in the lower extremities Neuro-alert, oriented x3, no focal deficit noted  Status is: Inpatient  Dispo: The patient is from: correctional facility              Anticipated d/c is to: correctional facility              Anticipated d/c date is:07/18/21               Patient currently not stable for discharge  Barrier to discharge- ongoing dyspnea  COVID-19 Labs  No results for input(s): DDIMER, FERRITIN, LDH, CRP in the last 72 hours.  Lab Results  Component Value Date   SARSCOV2NAA NEGATIVE 07/10/2021   SARSCOV2NAA NEGATIVE 04/04/2021   SARSCOV2NAA NEGATIVE 03/30/2021   SARSCOV2NAA NEGATIVE 03/21/2021            Recent Results (from the past 240 hour(s))  Resp Panel by RT-PCR (Flu A&B, Covid) Nasopharyngeal Swab     Status: None   Collection Time: 07/10/21 10:37 AM   Specimen: Nasopharyngeal Swab; Nasopharyngeal(NP) swabs in vial transport medium  Result Value Ref Range Status   SARS Coronavirus 2 by RT PCR NEGATIVE NEGATIVE Final    Comment: (NOTE) SARS-CoV-2 target nucleic acids are NOT DETECTED.  The SARS-CoV-2 RNA is generally detectable in upper respiratory specimens during the acute phase of infection. The lowest concentration of SARS-CoV-2 viral copies this assay can detect is 138 copies/mL. A negative result does not preclude SARS-Cov-2 infection and should not be used as the sole basis for treatment or other patient management decisions. A negative result may occur with  improper specimen collection/handling, submission of specimen other than nasopharyngeal swab, presence of viral mutation(s) within the areas targeted by this assay, and inadequate number of viral copies(<138 copies/mL). A negative result must be combined with clinical observations, patient history, and epidemiological information. The expected result is Negative.  Fact Sheet for Patients:  BloggerCourse.com  Fact Sheet for Healthcare Providers:  SeriousBroker.it  This test  is no t yet approved or cleared by the [place] and  has been authorized for detection and/or diagnosis of SARS-CoV-2 by FDA under an Emergency Use Authorization (EUA). This EUA will remain  in effect (meaning this test  can be used) for the duration of the COVID-19 declaration under Section 564(b)(1) of the Act, 21 U.S.C.section 360bbb-3(b)(1), unless the authorization is terminated  or revoked sooner.       Influenza A by PCR NEGATIVE NEGATIVE Final   Influenza B by PCR NEGATIVE NEGATIVE Final    Comment: (NOTE) The Xpert Xpress SARS-CoV-2/FLU/RSV plus assay is intended as an aid in the diagnosis of influenza from Nasopharyngeal swab specimens and should not be used as a sole basis for treatment. Nasal washings and aspirates are unacceptable for Xpert Xpress SARS-CoV-2/FLU/RSV testing.  Fact Sheet for Patients: BloggerCourse.com  Fact Sheet for Healthcare Providers: SeriousBroker.it  This test is not yet approved or cleared by the [place] FDA and has been authorized for detection and/or diagnosis of SARS-CoV-2 by FDA under an Emergency Use Authorization (EUA). This EUA will remain in effect (meaning this test can be used) for the duration of the COVID-19 declaration under Section 564(b)(1) of the Act, 21 U.S.C. section 360bbb-3(b)(1), unless the authorization is terminated or revoked.  Performed at [facility], [address] [place], [place] [address]   MRSA Next Gen by PCR, Nasal     Status: None   Collection Time: 07/11/21  1:00 PM   Specimen: Nasal Mucosa; Nasal Swab  Result Value Ref Range Status   MRSA by PCR Next Gen NOT DETECTED NOT DETECTED Final    Comment: (NOTE) The GeneXpert MRSA Assay (FDA approved for NASAL specimens only), is one component of a comprehensive MRSA colonization surveillance program. It is not intended to diagnose MRSA infection nor to guide or monitor treatment for MRSA infections. Test performance is not FDA approved in patients less than 72 years old. Performed at [facility], [address] [place], [place] [address]   Culture, blood (routine x 2) Call MD if unable to obtain prior  to antibiotics being given     Status: None   Collection Time: 07/11/21  5:22 PM   Specimen: BLOOD  Result Value Ref Range Status   Specimen Description BLOOD RIGHT UPPER ARM  Final   Special Requests   Final    IN PEDIATRIC BOTTLE Blood Culture results may not be optimal due to an excessive volume of blood received in culture bottles   Culture   Final    NO GROWTH 5 DAYS Performed at [facility], [address], [place], [place] [address]    Report Status 07/16/2021 FINAL  Final  Culture, blood (routine x 2) Call MD if unable to obtain prior to antibiotics being given     Status: None   Collection Time: 07/11/21  5:45 PM   Specimen: BLOOD LEFT HAND  Result Value Ref Range Status   Specimen Description BLOOD LEFT HAND  Final   Special Requests IN PEDIATRIC BOTTLE Blood Culture adequate volume  Final   Culture   Final    NO GROWTH 5 DAYS Performed at [facility], [address], [place], [place] [address]    Report Status 07/16/2021 FINAL  Final    Meredeth Ide   [facility] If 7PM-7AM, please contact night-coverage at [email or web address], Office  [phone]   07/16/2021, 2:16 PM  LOS: 6 days

## 2021-07-16 NOTE — Progress Notes (Signed)
Progress Note  Patient Name: Tommy Mathews Date of Encounter: 07/16/2021  Primary Cardiologist: None   Subjective   No events overnight.  Echo performed and appears relatively unchanged.  Patient notes his breathing feels great and he is ready to go.  Inpatient Medications    Scheduled Meds:  apixaban  5 mg Oral BID   cloNIDine  0.1 mg Oral TID   digoxin  0.25 mg Oral Daily   divalproex  750 mg Oral BID   FLUoxetine  20 mg Oral Daily   furosemide  40 mg Intravenous BID   hydrALAZINE  50 mg Oral Q8H   insulin aspart  0-5 Units Subcutaneous QHS   insulin aspart  0-9 Units Subcutaneous TID WC   insulin detemir  10 Units Subcutaneous QHS   ipratropium  0.5 mg Nebulization BID   isosorbide mononitrate  30 mg Oral Daily   QUEtiapine  200 mg Oral QHS   QUEtiapine  50 mg Oral Daily   Continuous Infusions:  PRN Meds: acetaminophen **OR** acetaminophen, albuterol, nitroGLYCERIN, ondansetron **OR** ondansetron (ZOFRAN) IV   Vital Signs    Vitals:   07/16/21 0657 07/16/21 0800 07/16/21 0830 07/16/21 0957  BP: (!) 127/91 (!) 158/93    Pulse:  79 86   Resp:  20    Temp:  98.2 F (36.8 C)    TempSrc:  Oral    SpO2:    92%  Weight:      Height:        Intake/Output Summary (Last 24 hours) at 07/16/2021 1220 Last data filed at 07/16/2021 1013 Gross per 24 hour  Intake 360 ml  Output 4800 ml  Net -4440 ml   Filed Weights   07/12/21 0332 07/14/21 0500 07/15/21 0428  Weight: 106.4 kg 107.6 kg 99.2 kg    Telemetry    SR- Label of PVCs is artifact - Personally Reviewed  ECG    No new - Personally Reviewed  Physical Exam   GEN: No acute distress.   Neck: No JVD Cardiac: RRR, no murmurs, rubs, or gallops.  Respiratory: Decreased breath sounds bilaterally GI: Soft, nontender, non-distended  MS: No edema; No deformity. Neuro:  Nonfocal  Psych: Normal affect   Assessed on 3 L O2  Labs    Chemistry Recent Labs  Lab 07/10/21 1036 07/10/21 1209  07/14/21 0411 07/15/21 0341 07/16/21 0818  NA 141   < > 132* 133* 134*  K 3.9   < > 3.8 4.1 5.4*  CL 103   < > 88* 90* 92*  CO2 30   < > 35* 34* 24  GLUCOSE 167*   < > 112* 92 157*  BUN 5*   < > 21* 23* 28*  CREATININE 1.03   < > 1.15 1.20 1.23  CALCIUM 8.5*   < > 8.4* 8.8* 8.8*  PROT 6.8  --   --   --   --   ALBUMIN 2.8*  --   --   --   --   AST 19  --   --   --   --   ALT 15  --   --   --   --   ALKPHOS 58  --   --   --   --   BILITOT 1.0  --   --   --   --   GFRNONAA >60   < > >60 >60 >60  ANIONGAP 8   < > 9 9 18*   < > =  values in this interval not displayed.     Hematology Recent Labs  Lab 07/12/21 0218 07/13/21 0645 07/14/21 0411  WBC 6.8 5.5 5.7  RBC 6.48* 6.75* 6.77*  HGB 17.5* 18.3* 17.9*  HCT 57.9* 60.9* 59.9*  MCV 89.4 90.2 88.5  MCH 27.0 27.1 26.4  MCHC 30.2 30.0 29.9*  RDW 20.5* 20.0* 19.9*  PLT 157 166 187    Cardiac EnzymesNo results for input(s): TROPONINI in the last 168 hours. No results for input(s): TROPIPOC in the last 168 hours.   BNP Recent Labs  Lab 07/10/21 1050 07/16/21 0818  BNP 1,316.0* 26.5     DDimer No results for input(s): DDIMER in the last 168 hours.   Radiology    No results found.   Patient Profile     60 y.o. 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      Signed, Christell Constant, MD  07/16/2021, 12:20 PM

## 2021-07-16 NOTE — Plan of Care (Signed)

## 2021-07-17 ENCOUNTER — Other Ambulatory Visit (HOSPITAL_COMMUNITY): Payer: Self-pay

## 2021-07-17 DIAGNOSIS — I480 Paroxysmal atrial fibrillation: Secondary | ICD-10-CM

## 2021-07-17 DIAGNOSIS — I5022 Chronic systolic (congestive) heart failure: Secondary | ICD-10-CM | POA: Diagnosis not present

## 2021-07-17 DIAGNOSIS — J810 Acute pulmonary edema: Secondary | ICD-10-CM | POA: Diagnosis not present

## 2021-07-17 DIAGNOSIS — J439 Emphysema, unspecified: Secondary | ICD-10-CM

## 2021-07-17 DIAGNOSIS — J9621 Acute and chronic respiratory failure with hypoxia: Secondary | ICD-10-CM | POA: Diagnosis not present

## 2021-07-17 LAB — BASIC METABOLIC PANEL
Anion gap: 11 (ref 5–15)
BUN: 31 mg/dL — ABNORMAL HIGH (ref 6–20)
CO2: 30 mmol/L (ref 22–32)
Calcium: 9 mg/dL (ref 8.9–10.3)
Chloride: 89 mmol/L — ABNORMAL LOW (ref 98–111)
Creatinine, Ser: 1.14 mg/dL (ref 0.61–1.24)
GFR, Estimated: >60 mL/min (ref >60)
Glucose, Bld: 94 mg/dL (ref 70–99)
Potassium: 4.1 mmol/L (ref 3.5–5.1)
Sodium: 130 mmol/L — ABNORMAL LOW (ref 135–145)

## 2021-07-17 LAB — GLUCOSE, CAPILLARY
Glucose-Capillary: 215 mg/dL — ABNORMAL HIGH (ref 70–99)
Glucose-Capillary: 75 mg/dL (ref 70–99)
Glucose-Capillary: 111 mg/dL — ABNORMAL HIGH (ref 70–99)
Glucose-Capillary: 140 mg/dL — ABNORMAL HIGH (ref 70–99)

## 2021-07-17 MED ORDER — CLONIDINE HCL 0.1 MG PO TABS: 0.1000 mg | ORAL_TABLET | Freq: Three times a day (TID) | ORAL | 11 refills | Status: AC

## 2021-07-17 MED ORDER — FUROSEMIDE 40 MG PO TABS
40.0000 mg | ORAL_TABLET | Freq: Every day | ORAL | Status: DC
  Administered 2021-07-18: 40 mg via ORAL
  Filled 2021-07-17: qty 1

## 2021-07-17 MED ORDER — EMPAGLIFLOZIN 10 MG PO TABS: 10.0000 mg | ORAL_TABLET | Freq: Every day | ORAL | 3 refills | Status: AC

## 2021-07-17 MED ORDER — ISOSORBIDE MONONITRATE ER 30 MG PO TB24: 30.0000 mg | ORAL_TABLET | Freq: Every day | ORAL | 3 refills | Status: AC

## 2021-07-17 MED ORDER — APIXABAN 5 MG PO TABS: 5.0000 mg | ORAL_TABLET | Freq: Two times a day (BID) | ORAL | 1 refills | Status: AC

## 2021-07-17 MED ORDER — EMPAGLIFLOZIN 10 MG PO TABS
10.0000 mg | ORAL_TABLET | Freq: Every day | ORAL | Status: DC
  Administered 2021-07-18: 10 mg via ORAL
  Filled 2021-07-17 (×2): qty 1

## 2021-07-17 MED ORDER — FUROSEMIDE 40 MG PO TABS: 40.0000 mg | ORAL_TABLET | Freq: Every day | ORAL | 2 refills | Status: AC

## 2021-07-17 MED ORDER — HYDRALAZINE HCL 50 MG PO TABS: 50.0000 mg | ORAL_TABLET | Freq: Three times a day (TID) | ORAL | 3 refills | Status: AC

## 2021-07-17 MED ORDER — ALBUTEROL SULFATE (2.5 MG/3ML) 0.083% IN NEBU: 2.5000 mg | INHALATION_SOLUTION | RESPIRATORY_TRACT | 12 refills | Status: AC | PRN

## 2021-07-17 NOTE — Plan of Care (Signed)

## 2021-07-17 NOTE — Discharge Summary (Addendum)
Physician Discharge Summary  Tommy Mathews DGU:[id number] DOB: [birth year] DOA: 07/10/2021  PCP: Tommy Housekeeper, MD  Admit date: 07/10/2021 Discharge date: 07/18/2021  Time spent: 60 minutes  Recommendations for Outpatient Follow-up:  Patient to be discharged on oxygen 3 L/min Patient Saturations on 3 Liters of oxygen while Ambulating = 95%   Discharge Diagnoses:  Principal Problem:   Acute on chronic respiratory failure with hypoxia (HCC) Active Problems:   Insulin-requiring or dependent type II diabetes mellitus (HCC)   Schizophrenia (HCC)   Chronic HFrEF (heart failure with reduced ejection fraction) (HCC)   COPD (chronic obstructive pulmonary disease) (HCC)   DVT (deep venous thrombosis) (HCC)   Acute hypoxemic respiratory failure (HCC)   Acute on chronic respiratory failure [facility])   Discharge Condition: Stable  Diet recommendation: Heart healthy diet  Filed Weights   07/12/21 0332 07/14/21 0500 07/15/21 0428  Weight: 106.4 kg 107.6 kg 99.2 kg    History of present illness:  60 year old male with past medical history of traumatic brain injury, prior tracheostomy, hypertension, hyperlipidemia, PAF, diabetes mellitus type 2, COPD, chronic respiratory failure on oxygen 2 to 4 L/min, bipolar disorder was hypotensive on admission and in respiratory distress.  Started on BiPAP and nitroglycerin drip on admission.  Diuresed well with IV Lasix.    Hospital Course:   Acute on chronic hypoxemic respiratory failure -Likely combination of acute on chronic diastolic CHF, hypertensive emergency, community-acquired pneumonia -Treated with BiPAP Initially required oxygen HFNC 15 L/min, has been weaned off to 3 L/min via nasal cannula. -Diuresed well with Lasix, is net -24 L -He was tachypneic on admission, chest x-ray and CT scan showed multifocal pneumonia and also atelectasis -Continue po antibiotics; doxycycline -Pulmonology was consulted -PCCM has signed off   Acute on  chronic diastolic CHF -Initial BNP on 07/10/2021 was 1316, repeat BNP on 07/16/2021 was 26.5 -He was initially started on Lasix 80 mg IV twice daily -Lasix dose was changed to 40 mg IV twice daily due to rising BUN/creatinine -Net - 24 L -Echocardiogram showed diastolic dysfunction -Cardiology consulted, dose of IV Lasix changed to Lasix 40 mg p.o. 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 -Continue Eliquis 5 mg twice daily   Paroxysmal atrial fibrillation -Heart rate controlled -He has documented history of PAF -Continue digoxin -Continue Eliquis as above   Mood disorder -History of schizophrenia/bipolar disorder -Continue Seroquel, Depakote   Diabetes mellitus type 2 -Continue sliding scale insulin with NovoLog, Lantus   History of tracheostomy -Still has stoma -Stable  Procedures: Echocardiogram-showed EF of 55 to 60%  Consultations: Pulmonology Cardiology  Discharge Exam: Vitals:   07/17/21 0838 07/17/21 1131  BP:  125/82  Pulse: 80 78  Resp:  20  Temp:  98 F (36.7 C)  SpO2:  91%    General: Appears in no acute distress Cardiovascular: S1-S2, regular, no murmur auscultated Respiratory: Clear to auscultation bilaterally  Discharge Instructions   Discharge Instructions     Diet - low sodium heart healthy   Complete by: As directed    Increase activity slowly   Complete by: As directed       Allergies as of 07/17/2021   No Known Allergies  Medication List     STOP taking these medications    clonazePAM 0.5 MG tablet Commonly known as: KLONOPIN       TAKE these medications    albuterol (2.5 MG/3ML) 0.083% nebulizer  solution Commonly known as: PROVENTIL Take 3 mLs (2.5 mg total) by nebulization every 4 (four) hours as needed for wheezing or shortness of breath.   apixaban 5 MG Tabs tablet Commonly known as: ELIQUIS Take 1 tablet (5 mg total) by mouth 2 (two) times daily. What changed:  how much to take how to take this when to take this additional instructions   cloNIDine 0.1 MG tablet Commonly known as: CATAPRES Take 1 tablet (0.1 mg total) by mouth 3 (three) times daily. What changed: when to take this   digoxin 0.25 MG tablet Commonly known as: LANOXIN Take 1 tablet (0.25 mg total) by mouth daily.   divalproex 250 MG DR tablet Commonly known as: DEPAKOTE Take 3 tablets (750 mg total) by mouth 2 (two) times daily.   empagliflozin 10 MG Tabs tablet Commonly known as: JARDIANCE Take 1 tablet (10 mg total) by mouth daily.   FLUoxetine 20 MG capsule Commonly known as: PROZAC Take 1 capsule (20 mg total) by mouth daily.   furosemide 40 MG tablet Commonly known as: LASIX Take 1 tablet (40 mg total) by mouth daily. Start taking on: July 18, 2021   hydrALAZINE 50 MG tablet Commonly known as: APRESOLINE Take 1 tablet (50 mg total) by mouth every 8 (eight) hours. What changed:  medication strength how much to take   insulin glargine 100 UNIT/ML injection Commonly known as: LANTUS Inject 0.1 mLs (10 Units total) into the skin daily. What changed: when to take this   insulin regular 100 units/mL injection Commonly known as: NOVOLIN R Inject 2-12 Units into the skin See admin instructions. Three times daily per sliding scale   isosorbide mononitrate 30 MG 24 hr tablet Commonly known as: IMDUR Take 1 tablet (30 mg total) by mouth daily. Start taking on: July 18, 2021   QUEtiapine 200 MG tablet Commonly known as: SEROQUEL Take 1 tablet (200 mg total) by mouth at bedtime.   QUEtiapine 50 MG tablet Commonly known as: SEROQUEL Take 1 tablet (50 mg total) by mouth in the  morning.       No Known Allergies    The results of significant diagnostics from this hospitalization (including imaging, microbiology, ancillary and laboratory) are listed below for reference.    Significant Diagnostic Studies: CT Angio Chest Pulmonary Embolism (PE) W or WO Contrast  Result Date: 07/10/2021 CLINICAL DATA:  PE suspected, high prob recent PE, taken off anticoagulation EXAM: CT ANGIOGRAPHY CHEST WITH CONTRAST TECHNIQUE: Multidetector CT imaging of the chest was performed using the standard protocol during bolus administration of intravenous contrast. Multiplanar CT image reconstructions and MIPs were obtained to evaluate the vascular anatomy. CONTRAST:  20mL OMNIPAQUE IOHEXOL 350 MG/ML SOLN COMPARISON:  CT angio chest 04/10/2021, CT angio chest 08/25/2019 FINDINGS: Cardiovascular: Satisfactory opacification of the pulmonary arteries to the segmental level. No evidence of pulmonary embolism. The main pulmonary artery is enlarged measuring of 4.1 cm. Normal heart size. No significant pericardial effusion. The thoracic aorta is normal in caliber. Mild atherosclerotic plaque of the thoracic aorta. At least left anterior descending coronary artery calcifications. Mediastinum/Nodes: No enlarged mediastinal, hilar, or axillary lymph nodes. Thyroid gland, trachea, and esophagus demonstrate no significant findings. Lungs/Pleura: Stable to slightly increased in size trace bilateral, left greater than right, pleural  effusions. Bilateral lower lobe subsegmental atelectasis. Similar-appearing to slightly more conspicuous consolidation with air bronchograms within the left lower lobe. Interlobular septal wall thickening. Upper Abdomen: Cholelithiasis.  No acute abnormality. Musculoskeletal: No chest wall abnormality. No suspicious lytic or blastic osseous lesions. No acute displaced fracture. Multilevel degenerative changes of the spine. Review of the MIP images confirms the above findings.  IMPRESSION: 1. No pulmonary embolus. 2. Enlarged main pulmonary artery consistent with pulmonary hypertension. 3. Pulmonary edema with stable to slightly increased in size bilateral trace pleural effusions. 4. Slightly more conspicuous left lower lobe consolidation which may represent a combination of infection/inflammation and/or atelectasis. Underlying mass or nodule not excluded. Electronically Signed   By: Tish Frederickson M.D.   On: 07/10/2021 23:22   DG CHEST PORT 1 VIEW  Result Date: 07/12/2021 CLINICAL DATA:  Hypoxia EXAM: PORTABLE CHEST 1 VIEW COMPARISON:  07/10/2021 FINDINGS: Single frontal view of the chest demonstrates an enlarged cardiac silhouette. There is been improvement in the central vascular congestion, bilateral airspace disease, and effusion since prior study. Minimal basilar airspace disease and small effusions remain. No pneumothorax. IMPRESSION: 1. Improving volume status, with persistent but decreased congestive heart failure. Electronically Signed   By: Sharlet Salina M.D.   On: 07/12/2021 21:19   DG Chest Port 1 View  Result Date: 07/10/2021 CLINICAL DATA:  Shortness of breath. EXAM: PORTABLE CHEST 1 VIEW COMPARISON:  April 07, 2021. FINDINGS: Stable cardiomegaly. Increased bilateral perihilar and basilar opacities are noted concerning for edema or possibly pneumonia. Small bilateral pleural effusions are noted. Bony thorax is unremarkable. IMPRESSION: Increased bilateral lung opacities are noted concerning for worsening edema or pneumonia, with small bilateral pleural effusions. Electronically Signed   By: Lupita Raider M.D.   On: 07/10/2021 12:20   ECHOCARDIOGRAM COMPLETE  Result Date: 07/11/2021    ECHOCARDIOGRAM REPORT   Patient Name:   Tommy Mathews Date of Exam: 07/11/2021 Medical Rec #:  [id number]     Height:       69.0 in Accession #:    [id number]    Weight:       225.0 lb Date of Birth:  10-12-1960     BSA:          2.172 m Patient Age:    60 years      BP:            142/85 mmHg Patient Gender: M             HR:           81 bpm. Exam Location:  Inpatient Procedure: 2D Echo, Cardiac Doppler and Color Doppler Indications:    I50.40* Unspecified combined systolic (congestive) and diastolic                 (congestive) heart failure  History:        Patient has prior history of Echocardiogram examinations, most                 recent 04/11/2021. COPD; Signs/Symptoms:Shortness of Breath,                 Dyspnea and Altered Mental Status. Tracheostomy.  Sonographer:    Sheralyn Boatman RDCS Referring Phys: [id number] [facility]  Sonographer Comments: Technically difficult study due to poor echo windows. Image acquisition challenging due to uncooperative patient. Patient to the bed handcuffs in right decubitud position. Could not follow directions. Extremely difficult arm position for this study. Patient with one IV on nitro drip.  IMPRESSIONS  1. Left ventricular ejection fraction, by estimation, is 55%. The left ventricle has normal function. Left ventricular endocardial border not optimally defined to evaluate regional wall motion. There is mild concentric left ventricular hypertrophy. Left  ventricular diastolic parameters are consistent with Grade I diastolic dysfunction (impaired relaxation).  2. Right ventricular systolic function is mildly reduced. The right ventricular size is mildly enlarged. Tricuspid regurgitation signal is inadequate for assessing PA pressure.  3. A small pericardial effusion is present. The pericardial effusion is surrounding the apex. No evidence of increased pericardial pressure.  4. The mitral valve is normal in structure. No evidence of mitral valve regurgitation.  5. The aortic valve is tricuspid. Aortic valve regurgitation is not visualized. No aortic stenosis is present. Comparison(s): A prior study was performed on 04/11/21. Changes from prior study are noted. Pericardial effusion is new. FINDINGS  Left Ventricle: Left ventricular ejection fraction, by  estimation, is 55%. The left ventricle has normal function. Left ventricular endocardial border not optimally defined to evaluate regional wall motion. The left ventricular internal cavity size was normal in size. There is mild concentric left ventricular hypertrophy. Left ventricular diastolic parameters are consistent with Grade I diastolic dysfunction (impaired relaxation). Right Ventricle: The right ventricular size is mildly enlarged. No increase in right ventricular wall thickness. Right ventricular systolic function is mildly reduced. Tricuspid regurgitation signal is inadequate for assessing PA pressure. Left Atrium: Left atrial size was normal in size. Right Atrium: Right atrial size was normal in size. Pericardium: A small pericardial effusion is present. The pericardial effusion is surrounding the apex. Mitral Valve: The mitral valve is normal in structure. No evidence of mitral valve regurgitation. Tricuspid Valve: The tricuspid valve is normal in structure. Tricuspid valve regurgitation is not demonstrated. Aortic Valve: The aortic valve is tricuspid. Aortic valve regurgitation is not visualized. No aortic stenosis is present. Pulmonic Valve: Discrepancy between color and Spectral Doppler. The pulmonic valve was not well visualized. Pulmonic valve regurgitation is not visualized. No evidence of pulmonic stenosis. Aorta: The aortic root and ascending aorta are structurally normal, with no evidence of dilitation. IAS/Shunts: The atrial septum is grossly normal.  LEFT VENTRICLE PLAX 2D LVIDd:         5.50 cm      Diastology LVIDs:         3.80 cm      LV e' medial:    5.33 cm/s LV PW:         1.20 cm      LV E/e' medial:  11.8 LV IVS:        1.30 cm      LV e' lateral:   5.44 cm/s LVOT diam:     2.20 cm      LV E/e' lateral: 11.5 LV SV:         59 LV SV Index:   27 LVOT Area:     3.80 cm  LV Volumes (MOD) LV vol d, MOD A2C: 146.0 ml LV vol d, MOD A4C: 116.0 ml LV vol s, MOD A2C: 72.6 ml LV vol s, MOD A4C:  62.2 ml LV SV MOD A2C:     73.4 ml LV SV MOD A4C:     116.0 ml LV SV MOD BP:      77.1 ml RIGHT VENTRICLE RV S prime:     6.24 cm/s TAPSE (M-mode): 1.2 cm LEFT ATRIUM           Index  RIGHT ATRIUM           Index LA diam:      3.60 cm 1.66 cm/m   RA Area:     11.00 cm LA Vol (A2C): 18.1 ml 8.33 ml/m   RA Volume:   19.10 ml  8.79 ml/m LA Vol (A4C): 26.4 ml 12.15 ml/m  AORTIC VALVE             PULMONIC VALVE LVOT Vmax:   93.00 cm/s  PR End Diast Vel: 1.81 msec LVOT Vmean:  61.800 cm/s LVOT VTI:    0.156 m  AORTA Ao Root diam: 3.30 cm MITRAL VALVE MV Area (PHT): 3.91 cm    SHUNTS MV Decel Time: 194 msec    Systemic VTI:  0.16 m MV E velocity: 62.80 cm/s  Systemic Diam: 2.20 cm MV A velocity: 98.35 cm/s MV E/A ratio:  0.64 Riley Lam MD Electronically signed by Riley Lam MD Signature Date/Time: 07/11/2021/2:03:46 PM    Final    VAS [place] LOWER EXTREMITY VENOUS (DVT)  Result Date: 07/11/2021  Lower Venous DVT Study Patient Name:  CAMERYN CHRISLEY  Date of Exam:   07/11/2021 Medical Rec #: [id number]      Accession #:    [id number] Date of Birth: 19-Dec-1960      Patient Gender: M Patient Age:   62 years Exam Location:  [facility] Procedure:      VAS [place] LOWER EXTREMITY VENOUS (DVT) Referring Phys: Dorcas Carrow --------------------------------------------------------------------------------  Indications: Swelling.  Risk Factors: HX of DVT & PE. Anticoagulation: Eliquis. Comparison Study: 04/11/2021 - Positive DVT RLE - CFV, FV, PopV & LLE FV(mid) Performing Technologist: Jody Hill RVT, RDMS  Examination Guidelines: A complete evaluation includes B-mode imaging, spectral Doppler, color Doppler, and power Doppler as needed of all accessible portions of each vessel. Bilateral testing is considered an integral part of a complete examination. Limited examinations for reoccurring indications may be performed as noted. The reflux portion of the exam is performed with the patient in reverse  Trendelenburg.  +---------+---------------+---------+-----------+----------+-------------------+ RIGHT    CompressibilityPhasicitySpontaneityPropertiesThrombus Aging      +---------+---------------+---------+-----------+----------+-------------------+ CFV      Full           Yes      Yes                                      +---------+---------------+---------+-----------+----------+-------------------+ SFJ      Partial                                      Chronic             +---------+---------------+---------+-----------+----------+-------------------+ FV Prox  Partial        Yes      Yes                  Chronic             +---------+---------------+---------+-----------+----------+-------------------+ FV Mid   None           No       No                   Chronic             +---------+---------------+---------+-----------+----------+-------------------+ FV DistalNone           No  No                   Chronic             +---------+---------------+---------+-----------+----------+-------------------+ PFV      Full                                                             +---------+---------------+---------+-----------+----------+-------------------+ POP      Partial        Yes      Yes                  Chronic             +---------+---------------+---------+-----------+----------+-------------------+ PTV      Full                                                             +---------+---------------+---------+-----------+----------+-------------------+ PERO     Full                                         Not well visualized +---------+---------------+---------+-----------+----------+-------------------+   +---------+---------------+---------+-----------+----------+-------------------+ LEFT     CompressibilityPhasicitySpontaneityPropertiesThrombus Aging       +---------+---------------+---------+-----------+----------+-------------------+ CFV      Full           Yes      Yes                                      +---------+---------------+---------+-----------+----------+-------------------+ SFJ      Full                                                             +---------+---------------+---------+-----------+----------+-------------------+ FV Prox  Full           Yes      Yes                                      +---------+---------------+---------+-----------+----------+-------------------+ FV Mid   Full           Yes      Yes                                      +---------+---------------+---------+-----------+----------+-------------------+ FV DistalFull           Yes      Yes                                      +---------+---------------+---------+-----------+----------+-------------------+ PFV  Full                                                             +---------+---------------+---------+-----------+----------+-------------------+ POP      Full           Yes      Yes                                      +---------+---------------+---------+-----------+----------+-------------------+ PTV      Full                                         Not well visualized +---------+---------------+---------+-----------+----------+-------------------+ PERO     Full                                         Not well visualized +---------+---------------+---------+-----------+----------+-------------------+     Summary: BILATERAL: - No evidence of superficial venous thrombosis in the lower extremities, bilaterally. -No evidence of popliteal cyst, bilaterally. RIGHT: - Findings consistent with chronic deep vein thrombosis involving the right femoral vein, SF junction, and right popliteal vein.  LEFT: - There is no evidence of deep vein thrombosis in the lower extremity.  *See table(s) above for measurements and  observations. Electronically signed by Lemar Livings MD on 07/11/2021 at 1:49:03 PM.    Final    ECHOCARDIOGRAM LIMITED  Result Date: 07/16/2021    ECHOCARDIOGRAM LIMITED REPORT   Patient Name:   CONNY MOENING Date of Exam: 07/16/2021 Medical Rec #:  [id number]     Height:       69.0 in Accession #:    [id number]    Weight:       218.7 lb Date of Birth:  1961-02-26     BSA:          2.146 m Patient Age:    60 years      BP:           127/91 mmHg Patient Gender: M             HR:           79 bpm. Exam Location:  Inpatient Procedure: Limited Echo Indications:    Dyspnea  History:        Patient has prior history of Echocardiogram examinations, most                 recent 07/11/2021. CHF, COPD, Signs/Symptoms:Shortness of                 Breath; Risk Factors:Diabetes.  Sonographer:    Mikki Harbor Referring Phys: [id number] MAHESH A CHANDRASEKHAR IMPRESSIONS  1. Left ventricular ejection fraction, by estimation, is 55 to 60%. The left ventricle has normal function. Left ventricular endocardial border not optimally defined to evaluate regional wall motion despite the use of Definity, however regional wall motion appears grossly normal. FINDINGS  Left Ventricle: Left ventricular ejection fraction, by estimation, is 55 to 60%. The left ventricle has normal function. Left ventricular endocardial border not optimally defined to evaluate  regional wall motion. Definity contrast agent was given IV to delineate the left ventricular endocardial borders. Weston Brass MD Electronically signed by Weston Brass MD Signature Date/Time: 07/16/2021/1:08:00 PM    Final     Microbiology: Recent Results (from the past 240 hour(s))  Resp Panel by RT-PCR (Flu A&B, Covid) Nasopharyngeal Swab     Status: None   Collection Time: 07/10/21 10:37 AM   Specimen: Nasopharyngeal Swab; Nasopharyngeal(NP) swabs in vial transport medium  Result Value Ref Range Status   SARS Coronavirus 2 by RT PCR NEGATIVE NEGATIVE Final    Comment:  (NOTE) SARS-CoV-2 target nucleic acids are NOT DETECTED.  The SARS-CoV-2 RNA is generally detectable in upper respiratory specimens during the acute phase of infection. The lowest concentration of SARS-CoV-2 viral copies this assay can detect is 138 copies/mL. A negative result does not preclude SARS-Cov-2 infection and should not be used as the sole basis for treatment or other patient management decisions. A negative result may occur with  improper specimen collection/handling, submission of specimen other than nasopharyngeal swab, presence of viral mutation(s) within the areas targeted by this assay, and inadequate number of viral copies(<138 copies/mL). A negative result must be combined with clinical observations, patient history, and epidemiological information. The expected result is Negative.  Fact Sheet for Patients:  BloggerCourse.com  Fact Sheet for Healthcare Providers:  SeriousBroker.it  This test is no t yet approved or cleared by the [place] FDA and  has been authorized for detection and/or diagnosis of SARS-CoV-2 by FDA under an Emergency Use Authorization (EUA). This EUA will remain  in effect (meaning this test can be used) for the duration of the COVID-19 declaration under Section 564(b)(1) of the Act, 21 U.S.C.section 360bbb-3(b)(1), unless the authorization is terminated  or revoked sooner.       Influenza A by PCR NEGATIVE NEGATIVE Final   Influenza B by PCR NEGATIVE NEGATIVE Final    Comment: (NOTE) The Xpert Xpress SARS-CoV-2/FLU/RSV plus assay is intended as an aid in the diagnosis of influenza from Nasopharyngeal swab specimens and should not be used as a sole basis for treatment. Nasal washings and aspirates are unacceptable for Xpert Xpress SARS-CoV-2/FLU/RSV testing.  Fact Sheet for Patients: BloggerCourse.com  Fact Sheet for Healthcare  Providers: SeriousBroker.it  This test is not yet approved or cleared by the [place] FDA and has been authorized for detection and/or diagnosis of SARS-CoV-2 by FDA under an Emergency Use Authorization (EUA). This EUA will remain in effect (meaning this test can be used) for the duration of the COVID-19 declaration under Section 564(b)(1) of the Act, 21 U.S.C. section 360bbb-3(b)(1), unless the authorization is terminated or revoked.  Performed at [facility], [address], [place], [place] [address]   MRSA Next Gen by PCR, Nasal     Status: None   Collection Time: 07/11/21  1:00 PM   Specimen: Nasal Mucosa; Nasal Swab  Result Value Ref Range Status   MRSA by PCR Next Gen NOT DETECTED NOT DETECTED Final    Comment: (NOTE) The GeneXpert MRSA Assay (FDA approved for NASAL specimens only), is one component of a comprehensive MRSA colonization surveillance program. It is not intended to diagnose MRSA infection nor to guide or monitor treatment for MRSA infections. Test performance is not FDA approved in patients less than 25 years old. Performed at [facility], [address] [address], [place], [place] [address]   Culture, blood (routine x 2) Call MD if unable to obtain prior to  antibiotics being given     Status: None   Collection Time: 07/11/21  5:22 PM   Specimen: BLOOD  Result Value Ref Range Status   Specimen Description BLOOD RIGHT UPPER ARM  Final   Special Requests   Final    IN PEDIATRIC BOTTLE Blood Culture results may not be optimal due to an excessive volume of blood received in culture bottles   Culture   Final    NO GROWTH 5 DAYS Performed at [facility], [address] [place], [place] [address]    Report Status 07/16/2021 FINAL  Final  Culture, blood (routine x 2) Call MD if unable to obtain prior to antibiotics being given     Status: None   Collection Time: 07/11/21  5:45 PM   Specimen: BLOOD LEFT HAND  Result  Value Ref Range Status   Specimen Description BLOOD LEFT HAND  Final   Special Requests IN PEDIATRIC BOTTLE Blood Culture adequate volume  Final   Culture   Final    NO GROWTH 5 DAYS Performed at [facility], [address], [place], [place] [address]    Report Status 07/16/2021 FINAL  Final     Labs: Basic Metabolic Panel: Recent Labs  Lab 07/11/21 1817 07/12/21 0218 07/13/21 0645 07/14/21 0411 07/15/21 0341 07/16/21 0818 07/17/21 0434  NA 136 136 136 132* 133* 134* 130*  K 4.1 5.0 3.9 3.8 4.1 5.4* 4.1  CL 95* 90* 90* 88* 90* 92* 89*  CO2 32 38* 35* 35* 34* 24 30  GLUCOSE 162* 103* 76 112* 92 157* 94  BUN 21* 23* 28* 31*  CREATININE 1.30* 1.24 1.08 1.15 1.20 1.23 1.14  CALCIUM 7.9* 8.0* 8.3* 8.4* 8.8* 8.8* 9.0  MG 1.5* 1.7 1.7 1.8  --   --   --    Liver Function Tests: No results for input(s): AST, ALT, ALKPHOS, BILITOT, PROT, ALBUMIN in the last 168 hours. No results for input(s): LIPASE, AMYLASE in the last 168 hours. No results for input(s): AMMONIA in the last 168 hours. CBC: Recent Labs  Lab 07/10/21 1209 07/11/21 0508 07/12/21 0218 07/13/21 0645 07/14/21 0411  WBC  --  6.6 6.8 5.5 5.7  NEUTROABS  --   --  4.1 3.0 2.7  HGB 20.1* 16.6 17.5* 18.3* 17.9*  HCT 59.0* 55.7* 57.9* 60.9* 59.9*  MCV  --  90.0 89.4 90.2 88.5  PLT  --  145* 157 166 187   Cardiac Enzymes: No results for input(s): CKTOTAL, CKMB, CKMBINDEX, TROPONINI in the last 168 hours. BNP: BNP (last 3 results) Recent Labs    03/21/21 1111 07/10/21 1050 07/16/21 0818  BNP 263.8* 1,316.0* 26.5    ProBNP (last 3 results) No results for input(s): PROBNP in the last 8760 hours.  CBG: Recent Labs  Lab 07/16/21 1243 07/16/21 1605 07/16/21 2039 07/17/21 0748 07/17/21 1131  GLUCAP 121* 106* 146* 215* 75       Signed:  Meredeth Ide MD.  [facility] 07/17/2021, 11:56 AM

## 2021-07-17 NOTE — TOC Benefit Eligibility Note (Signed)
Patient Product/process development scientist completed.    The patient is currently admitted and upon discharge could be taking Invokana 100 mg.  Requires Prior Authorization   The patient is currently admitted and upon discharge could be taking Steglatro 150 mg.  Requires Prior Authorization   The patient is currently admitted and upon discharge could be taking Jardiance 10 mg.  Requires Prior Authorization   The patient is currently admitted and upon discharge could be taking Farxiga 10 mg.  Requires Prior Authorization   The patient is insured through [facility]     Roland Earl, CPhT Pharmacy Patient Advocate Specialist [facility] Antimicrobial Stewardship Team Direct Number: [phone]  Fax: [phone]

## 2021-07-17 NOTE — Progress Notes (Signed)
Pt has PRN Bipap orders, pt resting comfortably on 3L HFNC. No need for Bipap at this time.

## 2021-07-17 NOTE — Progress Notes (Signed)
Called [employer] for update on patient's portable oxygen status and spoke with after hour representative "Joni Reining". Per Joni Reining she does not have information regarding ETA for the delivery of the oxygen. Left message with Joni Reining for her to relay to the appropriate office and requested a call back with an update. Will continue to follow up.

## 2021-07-17 NOTE — Progress Notes (Signed)
Discharge instruction given to the patient and he stated understanding. Questions and concerns answered. Patient being discharged with 3L oxygen. This nurse asked the officer present in the room about the home oxygen and informed that the patient needed oxygen at time of discharge. The officer does not have any information about patient's home oxygen and how the facility arranges the oxygen. Reached out to social worker Samson Frederic regarding the oxygen situation.  115pm: After receiving phone no. For medical team at [facility], this nurse attempted to call for report and ask about patient's oxygen. Was not able to reach out to the nurse at this time. Left the name and number to call back.

## 2021-07-17 NOTE — TOC Benefit Eligibility Note (Signed)
Patient Advocate Encounter  Prior Authorization for Jardiance 10 mg has been approved.    PA# [id number] W Effective dates: 07/17/2021 through 07/17/2022  Patients co-pay is $0.00.     Roland Earl, CPhT Pharmacy Patient Advocate Specialist  Antimicrobial Stewardship Team Direct Number: [phone]  Fax: [phone]

## 2021-07-17 NOTE — Progress Notes (Signed)
Progress Note  Patient Name: Tommy Mathews Date of Encounter: 07/17/2021  [facility] Cardiologist: [facility] (last note 2019)  Subjective   No acute events overnight. Feels that breathing is at baseline.  Inpatient Medications    Scheduled Meds:  apixaban  5 mg Oral BID   cloNIDine  0.1 mg Oral TID   digoxin  0.25 mg Oral Daily   divalproex  750 mg Oral BID   empagliflozin  10 mg Oral Daily   FLUoxetine  20 mg Oral Daily   [START ON 07/18/2021] furosemide  40 mg Oral Daily   hydrALAZINE  50 mg Oral Q8H   insulin aspart  0-5 Units Subcutaneous QHS   insulin aspart  0-9 Units Subcutaneous TID WC   insulin detemir  10 Units Subcutaneous QHS   isosorbide mononitrate  30 mg Oral Daily   QUEtiapine  200 mg Oral QHS   QUEtiapine  50 mg Oral Daily   Continuous Infusions:  PRN Meds: acetaminophen **OR** acetaminophen, albuterol, nitroGLYCERIN, ondansetron **OR** ondansetron (ZOFRAN) IV   Vital Signs    Vitals:   07/16/21 2009 07/17/21 0500 07/17/21 0756 07/17/21 0838  BP:  124/87 133/73   Pulse:  83 68 80  Resp:  17 19   Temp:  98 F (36.7 C) 98.5 F (36.9 C)   TempSrc:  Oral Oral   SpO2: 94% 96% 97%   Weight:      Height:        Intake/Output Summary (Last 24 hours) at 07/17/2021 0937 Last data filed at 07/17/2021 0827 Gross per 24 hour  Intake --  Output 1200 ml  Net -1200 ml   Last 3 Weights 07/15/2021 07/14/2021 07/12/2021  Weight (lbs) 218 lb 11.1 oz 237 lb 3.4 oz 234 lb 9.1 oz  Weight (kg) 99.2 kg 107.6 kg 106.4 kg      Telemetry    NSR - Personally Reviewed  ECG    No new since 07/10/21, sinus rhythm - Personally Reviewed  Physical Exam   GEN: No acute distress.   Neck: No JVD at 60 degrees Cardiac: RRR, no murmurs, rubs, or gallops.  Respiratory: Distant breath sounds bilaterally without wheezing. Alto in place GI: Soft, nontender, non-distended  MS: No edema; No deformity. Neuro:  Nonfocal  Psych: Normal affect   Labs     High Sensitivity Troponin:   Recent Labs  Lab 07/10/21 1036  TROPONINIHS 31*     Chemistry Recent Labs  Lab 07/10/21 1036 07/10/21 1209 07/12/21 0218 07/13/21 0645 07/14/21 0411 07/15/21 0341 07/16/21 0818 07/17/21 0434  NA 141   < > 136 136 132* 133* 134* 130*  K 3.9   < > 5.0 3.9 3.8 4.1 5.4* 4.1  CL 103   < > 90* 90* 88* 90* 92* 89*  CO2 30   < > 38* 35* 35* 34* 24 30  GLUCOSE 167*   < > 103* 76 112* 92 157* 94  BUN 5*   < > 15 17 21* 23* 28* 31*  CREATININE 1.03   < > 1.24 1.08 1.15 1.20 1.23 1.14  CALCIUM 8.5*   < > 8.0* 8.3* 8.4* 8.8* 8.8* 9.0  MG  --    < > 1.7 1.7 1.8  --   --   --   PROT 6.8  --   --   --   --   --   --   --   ALBUMIN 2.8*  --   --   --   --   --   --   --  AST 19  --   --   --   --   --   --   --   ALT 15  --   --   --   --   --   --   --   ALKPHOS 58  --   --   --   --   --   --   --   BILITOT 1.0  --   --   --   --   --   --   --   GFRNONAA >60   < > >60 >60 >60 >60 >60 >60  ANIONGAP 8   < > 8 11 9 9  18* 11   < > = values in this interval not displayed.    Lipids No results for input(s): CHOL, TRIG, HDL, LABVLDL, LDLCALC, CHOLHDL in the last 168 hours.  Hematology Recent Labs  Lab 07/12/21 0218 07/13/21 0645 07/14/21 0411  WBC 6.8 5.5 5.7  RBC 6.48* 6.75* 6.77*  HGB 17.5* 18.3* 17.9*  HCT 57.9* 60.9* 59.9*  MCV 89.4 90.2 88.5  MCH 27.0 27.1 26.4  MCHC 30.2 30.0 29.9*  RDW 20.5* 20.0* 19.9*  PLT 157 166 187   Thyroid No results for input(s): TSH, FREET4 in the last 168 hours.  BNP Recent Labs  Lab 07/10/21 1050 07/16/21 0818  BNP 1,316.0* 26.5    DDimer No results for input(s): DDIMER in the last 168 hours.   Radiology    ECHOCARDIOGRAM LIMITED  Result Date: 07/16/2021    ECHOCARDIOGRAM LIMITED REPORT   Patient Name:   VINAL ROSENGRANT Date of Exam: 07/16/2021 Medical Rec #:  07/18/2021     Height:       69.0 in Accession #:    [id number]    Weight:       218.7 lb Date of Birth:  26-Apr-1961     BSA:          2.146 m  Patient Age:    60 years      BP:           127/91 mmHg Patient Gender: M             HR:           79 bpm. Exam Location:  Inpatient Procedure: Limited Echo Indications:    Dyspnea  History:        Patient has prior history of Echocardiogram examinations, most                 recent 07/11/2021. CHF, COPD, Signs/Symptoms:Shortness of                 Breath; Risk Factors:Diabetes.  Sonographer:    09/10/2021 Referring Phys: Mikki Harbor MAHESH A CHANDRASEKHAR IMPRESSIONS  1. Left ventricular ejection fraction, by estimation, is 55 to 60%. The left ventricle has normal function. Left ventricular endocardial border not optimally defined to evaluate regional wall motion despite the use of Definity, however regional wall motion appears grossly normal. FINDINGS  Left Ventricle: Left ventricular ejection fraction, by estimation, is 55 to 60%. The left ventricle has normal function. Left ventricular endocardial border not optimally defined to evaluate regional wall motion. Definity contrast agent was given IV to delineate the left ventricular endocardial borders. [id number] MD Electronically signed by Weston Brass MD Signature Date/Time: 07/16/2021/1:08:00 PM    Final     Cardiac Studies   Limited TTE 07/16/21  1. Left ventricular ejection fraction, by estimation, is 55  to 60%. The  left ventricle has normal function. Left ventricular endocardial border  not optimally defined to evaluate regional wall motion despite the use of  Definity, however regional wall  motion appears grossly normal.   TTE 07/11/21 1. Left ventricular ejection fraction, by estimation, is 55%. The left  ventricle has normal function. Left ventricular endocardial border not  optimally defined to evaluate regional wall motion. There is mild  concentric left ventricular hypertrophy. Left   ventricular diastolic parameters are consistent with Grade I diastolic  dysfunction (impaired relaxation).   2. Right ventricular systolic  function is mildly reduced. The right  ventricular size is mildly enlarged. Tricuspid regurgitation signal is  inadequate for assessing PA pressure.   3. A small pericardial effusion is present. The pericardial effusion is  surrounding the apex. No evidence of increased pericardial pressure.   4. The mitral valve is normal in structure. No evidence of mitral valve  regurgitation.   5. The aortic valve is tricuspid. Aortic valve regurgitation is not  visualized. No aortic stenosis is present.   Comparison(s): A prior study was performed on 04/11/21. Changes from prior  study are noted. Pericardial effusion is new.   Patient Profile     60 y.o. male with PMH COPD, chronic respiratory failure on home O2, hypertension, type II diabetes, paroxysmal atrial fibrillation who presented with acute on chronic hypoxemic respiratory failure  Assessment & Plan    Acute on chronic hypoxemic respiratory failure (with history of tracheostomy, now healed) Acute on chronic diastolic heart failure -COPD, on home O2 at baseline -admission weight 10/10 102.1 kg (though weight 10/14 noted as 107.6 kg?), currently weight is pending but last weight 99.2 kg. Charted as 24 L negative, but very little intake recorded, suspect this is inaccurate -appears clinically euvolemic -SGLT2i available on medicaid, but will need prior authorization. Will start Jardiance 10 mg daily today.  Paroxysmal atrial fibrillation -chadsvasc=2 -continue apixaban -he is in sinus rhythm. Required amiodarone, metoprolol, digoxin in different times in the past -would consider stopping digoxin in the future if he remains in sinus rhythm. Not currently on beta blocker, though this may require adjustment to his clonidine regimen.  [facility] will sign off.   Medication Recommendations:   CONTINUE Apixaban 5 mg BID Digoxin 0.25 mg daily (consider stopping as outpatient)  CHANGE compared to prior to admission Clonidine 0.1 mg three  times daily Hydralazine 50 mg three times daily  START this admission Empagliflozin 10 mg daily Furosemide 40 mg daily, would give additional 40 gm as needed for weight gain >3 lbs overnight or 5 lbs in a week Imdur 30 mg daily  Other recommendations (labs, testing, etc):  none  Follow up as an outpatient:  He has been seen by [facility] clinic in the past but tells me he has not seen cardiology as an outpatient. He is currently incarcerated, so recommend outpatient cardiology follow up with [facility] preferably if possible.  For questions or updates, please contact [facility] Please consult [email or web address] for contact info under        Signed, Jodelle Red, MD  07/17/2021, 9:37 AM

## 2021-07-17 NOTE — Evaluation (Signed)
Physical Therapy Evaluation Patient Details Name: Tommy Mathews MRN: [id number] DOB: Aug 05, 1961 Today's Date: 07/17/2021  History of Present Illness  Pt is a 60 y/o male admitted 10/10 secondary to SOB. Pt with acute respiratory failure with hypoxia, likely combination of acute on chronic diastolic CHF, hypertensive emergency, community-acquired pneumonia. PMH includes TBI, COPD, hypoxia, schizophrenia, DVT, HTN, a fib, and tracheostomy.  Clinical Impression  Pt admitted secondary to problem above with deficits below. Pt requiring supervision for bed mobility and min guard A for transfers and short distance gait using RW. Oxygen sats ranging from 88-93% on 3L during mobility. Educated about using WC at facility to increase safety. Will continue to follow acutely.        Recommendations for follow up therapy are one component of a multi-disciplinary discharge planning process, led by the attending physician.  Recommendations may be updated based on patient status, additional functional criteria and insurance authorization.  Follow Up Recommendations Other (comment) (return to jail)    Equipment Recommendations  None recommended by PT    Recommendations for Other Services       Precautions / Restrictions Precautions Precautions: Fall Precaution Comments: Watch O2 Restrictions Weight Bearing Restrictions: No      Mobility  Bed Mobility Overal bed mobility: Needs Assistance Bed Mobility: Supine to Sit;Sit to Supine     Supine to sit: Supervision Sit to supine: Supervision   General bed mobility comments: Supervision for safety.    Transfers Overall transfer level: Needs assistance Equipment used: Rolling walker (2 wheeled) Transfers: Sit to/from Stand Sit to Stand: Min guard         General transfer comment: Min guard for safety. Cues for hand placement.  Ambulation/Gait Ambulation/Gait assistance: Min guard Gait Distance (Feet): 30 Feet Assistive device: Rolling  walker (2 wheeled) Gait Pattern/deviations: Step-through pattern;Decreased stride length;Trunk flexed Gait velocity: Decreased   General Gait Details: Very slow, guarded gait. Min guard for safety. Cues for upright posture and proximity to device. Likely close to baseline. Educated about use of WC when he returns to jail for increased safety.  Stairs            Wheelchair Mobility    Modified Rankin (Stroke Patients Only)       Balance Overall balance assessment: Needs assistance Sitting-balance support: Feet supported;No upper extremity supported Sitting balance-Leahy Scale: Fair     Standing balance support: Bilateral upper extremity supported;During functional activity Standing balance-Leahy Scale: Poor Standing balance comment: Reliant on BUE support                             Pertinent Vitals/Pain Pain Assessment: No/denies pain    Home Living Family/patient expects to be discharged to:: Other (Comment) [address])                      Prior Function Level of Independence: Independent with assistive device(s)         Comments: Reports he uses rollator majority of time for ambulation tasks. Officer reports access to [facility] if needed.     Hand Dominance        Extremity/Trunk Assessment   Upper Extremity Assessment Upper Extremity Assessment: Generalized weakness    Lower Extremity Assessment Lower Extremity Assessment: Generalized weakness    Cervical / Trunk Assessment Cervical / Trunk Assessment: Kyphotic  Communication   Communication: Expressive difficulties  Cognition Arousal/Alertness: Awake/alert Behavior During Therapy: WFL for tasks assessed/performed Overall Cognitive Status: History  of cognitive impairments - at baseline                                 General Comments: TBI and schizophrenia at baseline      General Comments General comments (skin integrity, edema, etc.): Oxygen sats ranging from 88-93%  on 3L during mobility.    Exercises     Assessment/Plan    PT Assessment Patient needs continued PT services  PT Problem List Decreased strength;Decreased balance;Decreased activity tolerance;Decreased mobility;Decreased knowledge of use of DME;Decreased knowledge of precautions       PT Treatment Interventions DME instruction;Gait training;Stair training;Therapeutic activities;Functional mobility training;Therapeutic exercise;Balance training;Patient/family education    PT Goals (Current goals can be found in the Care Plan section)  Acute Rehab PT Goals Patient Stated Goal: to be discharged PT Goal Formulation: With patient Time For Goal Achievement: 07/31/21 Potential to Achieve Goals: Good    Frequency Min 3X/week   Barriers to discharge        Co-evaluation               AM-PAC PT "6 Clicks" Mobility  Outcome Measure Help needed turning from your back to your side while in a flat bed without using bedrails?: None Help needed moving from lying on your back to sitting on the side of a flat bed without using bedrails?: None Help needed moving to and from a bed to a chair (including a wheelchair)?: A Little Help needed standing up from a chair using your arms (e.g., wheelchair or bedside chair)?: A Little Help needed to walk in hospital room?: A Little Help needed climbing 3-5 steps with a railing? : A Lot 6 Click Score: 19    End of Session Equipment Utilized During Treatment: Gait belt Activity Tolerance: Patient tolerated treatment well Patient left: in bed;with call bell/phone within reach;Other (comment) (with officers present) Nurse Communication: Mobility status PT Visit Diagnosis: Unsteadiness on feet (R26.81);Muscle weakness (generalized) (M62.81)    Time: 5956-3875 PT Time Calculation (min) (ACUTE ONLY): 19 min   Charges:   PT Evaluation $PT Eval Moderate Complexity: 1 Mod          Farley Ly, PT, DPT  Acute Rehabilitation Services  Pager:  [phone] Office: [phone]   Lehman Prom 07/17/2021, 4:49 PM

## 2021-07-17 NOTE — TOC Benefit Eligibility Note (Signed)
Patient Advocate Encounter   Prior Authorization for Farxiga 10 mg has been approved.     PA# [id number] W Effective dates: 07/17/2021 through 07/17/2022   Patients co-pay is $0.00.        Roland Earl, CPhT Pharmacy Patient Advocate Specialist [place] Antimicrobial Stewardship Team Direct Number: [phone]  Fax: [phone]

## 2021-07-17 NOTE — TOC Progression Note (Addendum)
Transition of Care [facility]) - Progression Note    Patient Details  Name: Loi Rennaker MRN: [id number] Date of Birth: 07-Jul-1961  Transition of Care [facility] CM/SW Contact  Beckie Busing, RN Phone Number:[phone]  07/17/2021, 3:03 PM  Clinical Narrative:    CM received notification from bedside nurse that patient is discharging back to [facility] but has no O2 at bedside. CM spoke with Deon in the medical department at the jail to determine the company for O2 so that CM could contact company to have portable O2 tank delivered for transport. Deon to return call with name of O2 supply company. Awaiting return call.   1540 CM spoke with Joni Reining in the medical department at the [facility] jail. Per Joni Reining O2 is supplied by [employer] ([phone])CM called and spoke with after hours answering service. Request was made for portable O2 tank for transport. Representative states that someone will return the call within 1 hour and if no response after a few hours someone should call back. Bedside nurse has been made aware and company has been provided with the patients location and the number for the unit. TOC will continue to follow.          Expected Discharge Plan and Services           Expected Discharge Date: 07/17/21                                     Social Determinants of Health (SDOH) Interventions    Readmission Risk Interventions No flowsheet data found.

## 2021-07-18 LAB — GLUCOSE, CAPILLARY
Glucose-Capillary: 158 mg/dL — ABNORMAL HIGH (ref 70–99)
Glucose-Capillary: 151 mg/dL — ABNORMAL HIGH (ref 70–99)

## 2021-07-18 NOTE — Progress Notes (Signed)
SATURATION QUALIFICATIONS: (This note is used to comply with regulatory documentation for home oxygen)  Patient Saturations on Room Air at Rest = 81%  Patient Saturations on Room Air while Ambulating = 86%  Patient Saturations on 3 Liters of oxygen while Ambulating = 95%  Please briefly explain why patient needs home oxygen:

## 2021-07-18 NOTE — TOC Progression Note (Signed)
Transition of Care [facility]) - Progression Note    Patient Details  Name: Tommy Mathews MRN: [id number] Date of Birth: 1960/12/24  Transition of Care [facility]) CM/SW Contact  Beckie Busing, RN Phone Number:[phone]  07/18/2021, 9:20 AM  Clinical Narrative:    CM called [employer] to follow up on why portable O2 tank was not delivered. Per representative O2 can not be delivered to [facility] because the Hospital does not have an account with [employer]. The tank can only be delivered to the [facility] jail. CN spoke with officers at bedside to help determine the process however the officers are not aware of the process but provided the CM with the number for medical [phone]) message has been left. CM attempted to call [phone] & [phone]) both medical dept at [facility] jail. Messages have been left will await return call.         Expected Discharge Plan and Services           Expected Discharge Date: 07/17/21                                     Social Determinants of Health (SDOH) Interventions    Readmission Risk Interventions No flowsheet data found.

## 2021-08-13 ENCOUNTER — Emergency Department (HOSPITAL_COMMUNITY): Payer: Medicaid Other

## 2021-08-13 ENCOUNTER — Encounter (HOSPITAL_COMMUNITY): Payer: Self-pay | Admitting: Emergency Medicine

## 2021-08-13 ENCOUNTER — Emergency Department (HOSPITAL_COMMUNITY)
Admission: EM | Admit: 2021-08-13 | Discharge: 2021-08-13 | Disposition: A | Discharge status: Home / Self Care | Payer: Medicaid Other | Attending: Student | Admitting: Student

## 2021-08-13 ENCOUNTER — Other Ambulatory Visit: Payer: Self-pay

## 2021-08-13 DIAGNOSIS — J90 Pleural effusion, not elsewhere classified: Secondary | ICD-10-CM | POA: Insufficient documentation

## 2021-08-13 DIAGNOSIS — Z7984 Long term (current) use of oral hypoglycemic drugs: Secondary | ICD-10-CM | POA: Insufficient documentation

## 2021-08-13 DIAGNOSIS — I5033 Acute on chronic diastolic (congestive) heart failure: Secondary | ICD-10-CM | POA: Diagnosis not present

## 2021-08-13 DIAGNOSIS — E119 Type 2 diabetes mellitus without complications: Secondary | ICD-10-CM | POA: Insufficient documentation

## 2021-08-13 DIAGNOSIS — Z7901 Long term (current) use of anticoagulants: Secondary | ICD-10-CM | POA: Insufficient documentation

## 2021-08-13 DIAGNOSIS — S6992XA Unspecified injury of left wrist, hand and finger(s), initial encounter: Secondary | ICD-10-CM | POA: Diagnosis present

## 2021-08-13 DIAGNOSIS — Z79899 Other long term (current) drug therapy: Secondary | ICD-10-CM | POA: Insufficient documentation

## 2021-08-13 DIAGNOSIS — F17210 Nicotine dependence, cigarettes, uncomplicated: Secondary | ICD-10-CM | POA: Insufficient documentation

## 2021-08-13 DIAGNOSIS — S52572A Other intraarticular fracture of lower end of left radius, initial encounter for closed fracture: Secondary | ICD-10-CM | POA: Diagnosis not present

## 2021-08-13 DIAGNOSIS — J449 Chronic obstructive pulmonary disease, unspecified: Secondary | ICD-10-CM | POA: Insufficient documentation

## 2021-08-13 DIAGNOSIS — I110 Hypertensive heart disease with heart failure: Secondary | ICD-10-CM | POA: Diagnosis not present

## 2021-08-13 DIAGNOSIS — Z794 Long term (current) use of insulin: Secondary | ICD-10-CM | POA: Diagnosis not present

## 2021-08-13 DIAGNOSIS — J189 Pneumonia, unspecified organism: Secondary | ICD-10-CM

## 2021-08-13 DIAGNOSIS — I480 Paroxysmal atrial fibrillation: Secondary | ICD-10-CM | POA: Diagnosis not present

## 2021-08-13 DIAGNOSIS — W1839XA Other fall on same level, initial encounter: Secondary | ICD-10-CM | POA: Diagnosis not present

## 2021-08-13 DIAGNOSIS — S52501A Unspecified fracture of the lower end of right radius, initial encounter for closed fracture: Secondary | ICD-10-CM

## 2021-08-13 LAB — CBC WITH DIFFERENTIAL/PLATELET
Abs Immature Granulocytes: 0.01 10*3/uL (ref 0.00–0.07)
Basophils Absolute: 0 10*3/uL (ref 0.0–0.1)
Basophils Relative: 1 %
Eosinophils Absolute: 0.2 10*3/uL (ref 0.0–0.5)
Eosinophils Relative: 3 %
HCT: 67.4 % — ABNORMAL HIGH (ref 39.0–52.0)
Hemoglobin: 20.2 g/dL — ABNORMAL HIGH (ref 13.0–17.0)
Immature Granulocytes: 0 %
Lymphocytes Relative: 35 %
Lymphs Abs: 2.3 10*3/uL (ref 0.7–4.0)
MCH: 27.3 pg (ref 26.0–34.0)
MCHC: 30 g/dL (ref 30.0–36.0)
MCV: 91.2 fL (ref 80.0–100.0)
Monocytes Absolute: 1.1 10*3/uL — ABNORMAL HIGH (ref 0.1–1.0)
Monocytes Relative: 17 %
Neutro Abs: 3 10*3/uL (ref 1.7–7.7)
Neutrophils Relative %: 44 %
Platelets: 137 10*3/uL — ABNORMAL LOW (ref 150–400)
RBC: 7.39 MIL/uL — ABNORMAL HIGH (ref 4.22–5.81)
RDW: 18.3 % — ABNORMAL HIGH (ref 11.5–15.5)
WBC: 6.6 10*3/uL (ref 4.0–10.5)
nRBC: 0 % (ref 0.0–0.2)

## 2021-08-13 LAB — COMPREHENSIVE METABOLIC PANEL
ALT: 7 U/L (ref 0–44)
AST: 22 U/L (ref 15–41)
Albumin: 3.2 g/dL — ABNORMAL LOW (ref 3.5–5.0)
Alkaline Phosphatase: 55 U/L (ref 38–126)
Anion gap: 8 (ref 5–15)
BUN: 12 mg/dL (ref 6–20)
CO2: 28 mmol/L (ref 22–32)
Calcium: 9.1 mg/dL (ref 8.9–10.3)
Chloride: 100 mmol/L (ref 98–111)
Creatinine, Ser: 1.16 mg/dL (ref 0.61–1.24)
GFR, Estimated: >60 mL/min (ref >60)
Glucose, Bld: 138 mg/dL — ABNORMAL HIGH (ref 70–99)
Potassium: 4.9 mmol/L (ref 3.5–5.1)
Sodium: 136 mmol/L (ref 135–145)
Total Bilirubin: 1.1 mg/dL (ref 0.3–1.2)
Total Protein: 7.2 g/dL (ref 6.5–8.1)

## 2021-08-13 LAB — TROPONIN I (HIGH SENSITIVITY)
Troponin I (High Sensitivity): 22 ng/L — ABNORMAL HIGH (ref <18)
Troponin I (High Sensitivity): 24 ng/L — ABNORMAL HIGH (ref <18)

## 2021-08-13 MED ORDER — IOHEXOL 350 MG/ML SOLN
75.0000 mL | Freq: Once | INTRAVENOUS | Status: AC | PRN
  Administered 2021-08-13: 75 mL via INTRAVENOUS

## 2021-08-13 MED ORDER — DOXYCYCLINE HYCLATE 100 MG PO CAPS: 100.0000 mg | ORAL_CAPSULE | Freq: Two times a day (BID) | ORAL | 0 refills | Status: AC

## 2021-08-13 MED ORDER — AMOXICILLIN-POT CLAVULANATE 875-125 MG PO TABS: 1.0000 | ORAL_TABLET | Freq: Two times a day (BID) | ORAL | 0 refills | Status: AC

## 2021-08-13 NOTE — Discharge Instructions (Signed)
Your CT scan shows possible pneumonia versus atelectasis.  You are being treated with antibiotics in case this is pneumonia.  Return to the ER if you develop fever, coughing up blood, new or worsening shortness of breath or chest pain or any other new/concerning symptoms.  Follow-up with the orthopedist listed in about a week for your wrist fracture.

## 2021-08-13 NOTE — ED Notes (Signed)
Unable to get second trop

## 2021-08-13 NOTE — ED Provider Notes (Signed)
[facility] EMERGENCY DEPARTMENT Provider Note   CSN: [id number] Arrival date & time: 08/13/21  0623     History Chief Complaint  Patient presents with   Chest Pain   Shortness of Breath   Wrist Pain    Becker Christopher is a 60 y.o. male with PMH TBI, prior tracheostomy, HTN, HLD, paroxysmal A. fib, T2DM, COPD, chronic respiratory failure on 2 to 4 L, bipolar disorder who presents the emergency department for chest pain, shortness of breath and right wrist pain.  Patient states that yesterday he fell onto his right wrist and is having persistent wrist pain.  Upon arrival to the emergency department today he was complaining to the triage provider of chest pain and shortness of breath, but on my arrival, the patient denies chest pain and shortness of breath.  He denies fever, cough, headache, nausea, vomiting or other systemic symptoms.   Chest Pain Associated symptoms: shortness of breath   Associated symptoms: no abdominal pain, no back pain, no cough, no fever, no palpitations and no vomiting   Shortness of Breath Associated symptoms: chest pain   Associated symptoms: no abdominal pain, no cough, no ear pain, no fever, no rash, no sore throat and no vomiting   Wrist Pain Associated symptoms include chest pain and shortness of breath. Pertinent negatives include no abdominal pain.      Past Medical History:  Diagnosis Date   Bipolar 1 disorder (HCC)    COPD (chronic obstructive pulmonary disease) (HCC)    Diabetes (HCC)    GERD (gastroesophageal reflux disease)    HLD (hyperlipidemia)    HTN (hypertension)    Schizophrenia (HCC) 01/31/2021    Patient Active Problem List   Diagnosis Date Noted   Acute hypoxemic respiratory failure (HCC) 07/10/2021   Acute on chronic respiratory failure (HCC) 07/10/2021   Hypoxia    DVT (deep venous thrombosis) (HCC) 04/11/2021   Tracheostomy dependence (HCC)    Chronic respiratory failure with hypoxia (HCC)    COPD with  acute bronchitis (HCC) 02/01/2021   Polycythemia 02/01/2021   Renal insufficiency 02/01/2021   Acute on chronic diastolic CHF (congestive heart failure) (HCC) 12/14/2019   COPD (chronic obstructive pulmonary disease) (HCC)    Sepsis with acute hypoxic respiratory failure without septic shock (HCC)    Chronic HFrEF (heart failure with reduced ejection fraction) (HCC) 08/24/2019   Thrombocytopenia (HCC) 08/24/2019   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/2018   Bipolar I disorder, most recent episode depressed (HCC) 03/03/2018   Schizophrenia (HCC) 02/27/2018   GI bleed 01/06/2018   Acute on chronic respiratory failure with hypoxia (HCC) 01/06/2018   Pneumonia of both lungs due to infectious organism 01/06/2018   AKI (acute kidney injury) (HCC) 01/06/2018   Insulin-requiring or dependent type II diabetes mellitus (HCC) 01/06/2018    Past Surgical History:  Procedure Laterality Date   IR GASTROSTOMY TUBE REMOVAL  08/15/2018   PEG PLACEMENT N/A 01/21/2018   Procedure: PERCUTANEOUS ENDOSCOPIC GASTROSTOMY (PEG) PLACEMENT;  Surgeon: Wyline Mood, MD;  Location: [facility] ENDOSCOPY;  Service: Gastroenterology;  Laterality: N/A;   TRACHEOSTOMY TUBE PLACEMENT N/A 01/23/2018   Procedure: TRACHEOSTOMY;  Surgeon: Bud Face, MD;  Location: [facility] ORS;  Service: ENT;  Laterality: N/A;       No family history on file.  Social History   Tobacco Use   Smoking status: Every Day    Packs/day: 1.00    Years: 40.00    Pack years: 40.00  Types: Cigarettes    Start date: 2000   Smokeless tobacco: Never  Vaping Use   Vaping Use: Never used  Substance Use Topics   Alcohol use: Yes   Drug use: Never    Home Medications Prior to Admission medications   Medication Sig Start Date End Date Taking? Authorizing Provider  albuterol (PROVENTIL) (2.5 MG/3ML) 0.083% nebulizer solution Take 3 mLs (2.5 mg total) by nebulization every 4 (four) hours as needed for wheezing  or shortness of breath. 07/17/21   Oswald Hillock, MD  apixaban (ELIQUIS) 5 MG TABS tablet Take 1 tablet (5 mg total) by mouth 2 (two) times daily. 07/17/21   Oswald Hillock, MD  cloNIDine (CATAPRES) 0.1 MG tablet Take 1 tablet (0.1 mg total) by mouth 3 (three) times daily. 07/17/21   Oswald Hillock, MD  digoxin (LANOXIN) 0.25 MG tablet Take 1 tablet (0.25 mg total) by mouth daily. 09/02/19   Mitzi Hansen, MD  divalproex (DEPAKOTE) 250 MG DR tablet Take 3 tablets (750 mg total) by mouth 2 (two) times daily. 04/17/21 09/09/21  Samella Parr, NP  empagliflozin (JARDIANCE) 10 MG TABS tablet Take 1 tablet (10 mg total) by mouth daily. 07/17/21   Oswald Hillock, MD  FLUoxetine (PROZAC) 20 MG capsule Take 1 capsule (20 mg total) by mouth daily. 09/02/19   Mitzi Hansen, MD  furosemide (LASIX) 40 MG tablet Take 1 tablet (40 mg total) by mouth daily. 07/18/21   Oswald Hillock, MD  hydrALAZINE (APRESOLINE) 50 MG tablet Take 1 tablet (50 mg total) by mouth every 8 (eight) hours. 07/17/21   Oswald Hillock, MD  insulin glargine (LANTUS) 100 UNIT/ML injection Inject 0.1 mLs (10 Units total) into the skin daily. Patient taking differently: Inject 10 Units into the skin at bedtime. 04/18/21   Samella Parr, NP  insulin regular (NOVOLIN R) 100 units/mL injection Inject 2-12 Units into the skin See admin instructions. Three times daily per sliding scale    [provider]  isosorbide mononitrate (IMDUR) 30 MG 24 hr tablet Take 1 tablet (30 mg total) by mouth daily. 07/18/21   Oswald Hillock, MD  QUEtiapine (SEROQUEL) 200 MG tablet Take 1 tablet (200 mg total) by mouth at bedtime. 09/01/19   Mitzi Hansen, MD  QUEtiapine (SEROQUEL) 50 MG tablet Take 1 tablet (50 mg total) by mouth in the morning. 04/17/21 09/09/21  Samella Parr, NP    Allergies    Patient has no known allergies.  Review of Systems   Review of Systems  Constitutional:  Negative for chills and fever.  HENT:  Negative for ear pain  and sore throat.   Eyes:  Negative for pain and visual disturbance.  Respiratory:  Positive for shortness of breath. Negative for cough.   Cardiovascular:  Positive for chest pain. Negative for palpitations.  Gastrointestinal:  Negative for abdominal pain and vomiting.  Genitourinary:  Negative for dysuria and hematuria.  Musculoskeletal:  Positive for arthralgias. Negative for back pain.  Skin:  Negative for color change and rash.  Neurological:  Negative for seizures and syncope.  All other systems reviewed and are negative.  Physical Exam Updated Vital Signs BP (!) 205/89   Pulse 60   Temp 98.7 F (37.1 C) (Oral)   Resp 16   SpO2 96%   Physical Exam Vitals and nursing note reviewed.  Constitutional:      Appearance: He is well-developed.  HENT:     Head: Normocephalic and atraumatic.  Eyes:     Conjunctiva/sclera: Conjunctivae normal.  Cardiovascular:     Rate and Rhythm: Normal rate and regular rhythm.     Heart sounds: No murmur heard. Pulmonary:     Effort: Pulmonary effort is normal. No respiratory distress.     Breath sounds: Normal breath sounds.  Abdominal:     Palpations: Abdomen is soft.     Tenderness: There is no abdominal tenderness.  Musculoskeletal:     Cervical back: Neck supple.     Comments: Right wrist tenderness  Skin:    General: Skin is warm and dry.  Neurological:     Mental Status: He is alert.    ED Results / Procedures / Treatments   Labs (all labs ordered are listed, but only abnormal results are displayed) Labs Reviewed  CBC WITH DIFFERENTIAL/PLATELET - Abnormal; Notable for the following components:      Result Value   RBC 7.39 (*)    Hemoglobin 20.2 (*)    HCT 67.4 (*)    RDW 18.3 (*)    Platelets 137 (*)    Monocytes Absolute 1.1 (*)    All other components within normal limits  TROPONIN I (HIGH SENSITIVITY) - Abnormal; Notable for the following components:   Troponin I (High Sensitivity) 22 (*)    All other components  within normal limits  COMPREHENSIVE METABOLIC PANEL    EKG None  Radiology DG Chest 2 View  Result Date: 08/13/2021 CLINICAL DATA:  sob EXAM: CHEST - 2 VIEW COMPARISON:  July 12, 2021 FINDINGS: The cardiomediastinal silhouette is unchanged in contour. Small bilateral pleural effusions, similar comparison to prior. No pneumothorax. Diffuse interstitial prominence with cephalization of the vascular and vascular indistinctness. Retrocardiac consolidative opacity, similar in comparison to prior. Visualized abdomen is unremarkable. Mild degenerative changes of the thoracic spine. IMPRESSION: 1. Constellation of findings are favored to reflect pulmonary edema with small bilateral pleural effusions. LEFT basilar consolidative opacity could reflect atelectasis versus superimposed infection. Followup PA and lateral chest X-ray is recommended in 3-4 weeks following appropriate treatment to ensure resolution and exclude underlying malignancy. Electronically Signed   By: Valentino Saxon M.D.   On: 08/13/2021 10:11   DG Wrist Complete Right  Result Date: 08/13/2021 CLINICAL DATA:  sob EXAM: RIGHT WRIST - COMPLETE 3+ VIEW COMPARISON:  None. FINDINGS: Osteopenia. Overlying cast material limits evaluation for fine parenchymal detail. There is a minimally displaced fracture with intra-articular extension of the radial aspect of the distal radius. No significant angulation of the radiocarpal joint. Soft tissue edema. IMPRESSION: Minimally displaced fracture of the distal radius with intra-articular extension. If persistent clinical concern for scaphoid fracture, recommend immobilization and follow-up radiographs in 2 weeks versus MRI. Electronically Signed   By: Valentino Saxon M.D.   On: 08/13/2021 10:15    Procedures Procedures   Medications Ordered in ED Medications - No data to display  ED Course  I have reviewed the triage vital signs and the nursing notes.  Pertinent labs & imaging results  that were available during my care of the patient were reviewed by me and considered in my medical decision making (see chart for details).    MDM Rules/Calculators/A&P                           Patient seen the emergency department for evaluation of right wrist pain, chest pain and shortness of breath.  Physical exam reveals right wrist tenderness and swelling but is otherwise  unremarkable from the patient's baseline.  CBC with a hemoglobin of 20.2 and patient has had similar hemoglobin elevations in the past.  Unsure if the patient has polycythemia or obstructive sleep apnea.  Initial troponin 22.  Delta Lance Bosch is pending.  X-ray wrist with distal radius fracture and patient placed in a radial gutter.  Chest x-ray with bilateral pleural effusions and possible left opacification.  In the setting of the patient's significant comorbidities, CT PE was ordered.  Patient then signed out to oncoming provider.  Please see provider signout note for continuation of work-up. Final Clinical Impression(s) / ED Diagnoses Final diagnoses:  None    Rx / DC Orders ED Discharge Orders     None        Jissel Slavens, Debe Coder, MD 08/13/21 [phone]

## 2021-08-13 NOTE — ED Triage Notes (Signed)
[place] [facility].  Reports SOB and pain to center of chest since last night.  Pt also reports R wrist pain since a mechanical fall this morning.  ACE bandage in place on arrival.   Denies nausea and vomiting.

## 2021-08-13 NOTE — ED Notes (Signed)
Pts bp is 200/113 RN notified and pt states no other changes in condition.. no headache or double vision

## 2021-08-13 NOTE — ED Notes (Signed)
Deputies with the pt

## 2021-08-13 NOTE — ED Notes (Signed)
To ct

## 2021-08-13 NOTE — ED Notes (Signed)
Unsuccessful IV attempt x1. Dr. Posey Rea at bedside to look with [place]. Dr. Posey Rea recommends putting in STAT IV Team Consult

## 2021-08-13 NOTE — ED Provider Notes (Signed)
Care transferred to me. Awaiting CT and CMP. Chronically on O2. He currently denies CP/dyspnea but apparently had that complaint when he first arrived.  Patient's second troponin is flat and thus I think ACS is highly unlikely.  CTA with no PE.  There is questionable pneumonia versus atelectasis.  Given the transient shortness of breath and his complicated history I will treat with some antibiotics but this might just be atelectasis.  He continues to feel well.  He can follow-up with hand surgery as an outpatient and we will give the antibiotics upon discharge and he can follow-up with the jail physician.   Angiocath insertion Performed by: Audree Camel  Consent: Verbal consent obtained. Risks and benefits: risks, benefits and alternatives were discussed Time out: Immediately prior to procedure a "time out" was called to verify the correct patient, procedure, equipment, support staff and site/side marked as required.  Preparation: Patient was prepped and draped in the usual sterile fashion.  Vein Location: right basilic  Ultrasound Guided  Gauge: 20  Normal blood return and flush without difficulty Patient tolerance: Patient tolerated the procedure well with no immediate complications.     Pricilla Loveless, MD 08/13/21 2211

## 2021-08-13 NOTE — ED Provider Notes (Signed)
Emergency Medicine Provider Triage Evaluation Note  Tommy Mathews , a 60 y.o. male  was evaluated in triage.  Pt complains of worsening shortness of breath and right wrist pain.  Patient fell in the jail and his wrist is not painful and swollen.  Is on baseline oxygen for some heart failure.  Denies chest pain however is having an increased work of breathing.  Review of Systems  Positive: Shortness of breath, wrist pain Negative: Fever, chills, nausea, vomiting  Physical Exam  BP (!) 190/100 (BP Location: Left Arm)   Pulse (!) 55   Temp 98.8 F (37.1 C) (Oral)   Resp 18   SpO2 96%  Gen:   Awake, no distress   Resp:  Normal effort  MSK:   Moves extremities without difficulty  Other:  Scant wheezing bilaterally. RRR.  Large amounts of swelling to the right wrist.  No obvious deformity.  Medical Decision Making  Medically screening exam initiated at 10:10 AM.  Appropriate orders placed.  Tommy Mathews was informed that the remainder of the evaluation will be completed by another provider, this initial triage assessment does not replace that evaluation, and the importance of remaining in the ED until their evaluation is complete.     Tommy Benders, PA-C 08/13/21 1011    Tommy Mathews A, DO 08/13/21 1513

## 2021-08-13 NOTE — Progress Notes (Signed)
Orthopedic Tech Progress Note Patient Details:  Tommy Mathews 1961/05/01 [id number]   Ortho Devices Type of Ortho Device: Rad Gutter splint Ortho Device/Splint Location: RUE Ortho Device/Splint Interventions: Ordered, Application, Adjustment   Post Interventions Patient Tolerated: Well Instructions Provided: Care of device  Kendryck Lacroix Carmine Savoy 08/13/2021, 4:38 PM

## 2022-02-26 ENCOUNTER — Encounter (HOSPITAL_COMMUNITY): Payer: Self-pay | Admitting: Emergency Medicine

## 2022-02-26 ENCOUNTER — Other Ambulatory Visit: Payer: Self-pay

## 2022-02-26 ENCOUNTER — Emergency Department (HOSPITAL_COMMUNITY)
Admission: EM | Admit: 2022-02-26 | Discharge: 2022-02-26 | Disposition: A | Discharge status: Home / Self Care | Payer: Medicaid Other | Attending: Emergency Medicine | Admitting: Emergency Medicine

## 2022-02-26 ENCOUNTER — Emergency Department (HOSPITAL_COMMUNITY): Payer: Medicaid Other

## 2022-02-26 DIAGNOSIS — R0602 Shortness of breath: Secondary | ICD-10-CM | POA: Diagnosis present

## 2022-02-26 DIAGNOSIS — Z20822 Contact with and (suspected) exposure to covid-19: Secondary | ICD-10-CM | POA: Diagnosis not present

## 2022-02-26 DIAGNOSIS — J45909 Unspecified asthma, uncomplicated: Secondary | ICD-10-CM | POA: Insufficient documentation

## 2022-02-26 DIAGNOSIS — I509 Heart failure, unspecified: Secondary | ICD-10-CM | POA: Insufficient documentation

## 2022-02-26 DIAGNOSIS — Z7951 Long term (current) use of inhaled steroids: Secondary | ICD-10-CM | POA: Diagnosis not present

## 2022-02-26 DIAGNOSIS — J441 Chronic obstructive pulmonary disease with (acute) exacerbation: Secondary | ICD-10-CM | POA: Diagnosis not present

## 2022-02-26 DIAGNOSIS — Z794 Long term (current) use of insulin: Secondary | ICD-10-CM | POA: Insufficient documentation

## 2022-02-26 LAB — COMPREHENSIVE METABOLIC PANEL
ALT: 8 U/L (ref 0–44)
AST: 26 U/L (ref 15–41)
Albumin: 2.6 g/dL — ABNORMAL LOW (ref 3.5–5.0)
Alkaline Phosphatase: 43 U/L (ref 38–126)
Anion gap: 8 (ref 5–15)
BUN: 15 mg/dL (ref 6–20)
CO2: 30 mmol/L (ref 22–32)
Calcium: 8.6 mg/dL — ABNORMAL LOW (ref 8.9–10.3)
Chloride: 100 mmol/L (ref 98–111)
Creatinine, Ser: 1.13 mg/dL (ref 0.61–1.24)
GFR, Estimated: >60 mL/min (ref >60)
Glucose, Bld: 118 mg/dL — ABNORMAL HIGH (ref 70–99)
Potassium: 5.3 mmol/L — ABNORMAL HIGH (ref 3.5–5.1)
Sodium: 138 mmol/L (ref 135–145)
Total Bilirubin: 1 mg/dL (ref 0.3–1.2)
Total Protein: 6.6 g/dL (ref 6.5–8.1)

## 2022-02-26 LAB — CBC
HCT: 53.5 % — ABNORMAL HIGH (ref 39.0–52.0)
Hemoglobin: 16.1 g/dL (ref 13.0–17.0)
MCH: 27.2 pg (ref 26.0–34.0)
MCHC: 30.1 g/dL (ref 30.0–36.0)
MCV: 90.2 fL (ref 80.0–100.0)
Platelets: 207 10*3/uL (ref 150–400)
RBC: 5.93 MIL/uL — ABNORMAL HIGH (ref 4.22–5.81)
RDW: 18.3 % — ABNORMAL HIGH (ref 11.5–15.5)
WBC: 8.6 10*3/uL (ref 4.0–10.5)
nRBC: 1 % — ABNORMAL HIGH (ref 0.0–0.2)

## 2022-02-26 LAB — DIGOXIN LEVEL: Digoxin Level: 0.8 ng/mL (ref 0.8–2.0)

## 2022-02-26 LAB — TROPONIN I (HIGH SENSITIVITY)
Troponin I (High Sensitivity): 20 ng/L — ABNORMAL HIGH (ref <18)
Troponin I (High Sensitivity): 19 ng/L — ABNORMAL HIGH (ref <18)

## 2022-02-26 LAB — BRAIN NATRIURETIC PEPTIDE: B Natriuretic Peptide: 134.4 pg/mL — ABNORMAL HIGH (ref 0.0–100.0)

## 2022-02-26 LAB — SARS CORONAVIRUS 2 BY RT PCR: SARS Coronavirus 2 by RT PCR: NEGATIVE

## 2022-02-26 MED ORDER — PREDNISONE 10 MG (21) PO TBPK: ORAL_TABLET | Freq: Every day | ORAL | Status: AC

## 2022-02-26 MED ORDER — IPRATROPIUM BROMIDE 0.02 % IN SOLN
0.5000 mg | Freq: Once | RESPIRATORY_TRACT | Status: AC
  Administered 2022-02-26: 0.5 mg via RESPIRATORY_TRACT
  Filled 2022-02-26: qty 2.5

## 2022-02-26 MED ORDER — IPRATROPIUM-ALBUTEROL 0.5-2.5 (3) MG/3ML IN SOLN
3.0000 mL | RESPIRATORY_TRACT | Status: AC
  Administered 2022-02-26 (×3): 3 mL via RESPIRATORY_TRACT
  Filled 2022-02-26 (×2): qty 3

## 2022-02-26 MED ORDER — ALBUTEROL SULFATE HFA 108 (90 BASE) MCG/ACT IN AERS
2.0000 | INHALATION_SPRAY | Freq: Once | RESPIRATORY_TRACT | Status: AC
Start: 2022-02-26 — End: 2022-02-26
  Administered 2022-02-26: 2 via RESPIRATORY_TRACT
  Filled 2022-02-26: qty 6.7

## 2022-02-26 MED ORDER — MAGNESIUM SULFATE 2 GM/50ML IV SOLN
2.0000 g | Freq: Once | INTRAVENOUS | Status: AC
  Administered 2022-02-26: 2 g via INTRAVENOUS
  Filled 2022-02-26: qty 50

## 2022-02-26 MED ORDER — METHYLPREDNISOLONE SODIUM SUCC 125 MG IJ SOLR
125.0000 mg | INTRAMUSCULAR | Status: AC
Start: 2022-02-26 — End: 2022-02-26
  Administered 2022-02-26: 125 mg via INTRAVENOUS
  Filled 2022-02-26: qty 2

## 2022-02-26 MED ORDER — ALBUTEROL SULFATE (2.5 MG/3ML) 0.083% IN NEBU
5.0000 mg | INHALATION_SOLUTION | Freq: Once | RESPIRATORY_TRACT | Status: AC
  Administered 2022-02-26: 5 mg via RESPIRATORY_TRACT
  Filled 2022-02-26: qty 6

## 2022-02-26 NOTE — ED Provider Notes (Signed)
[facility] EMERGENCY DEPARTMENT Provider Note   CSN: [id number] Arrival date & time: 02/26/22  1458     History  Chief Complaint  Patient presents with   Shortness of Breath    Tommy Mathews is a 61 y.o. male.   Shortness of Breath  Patient is a 61 year old male presented emergency room today with complaints of shortness of breath.  [facility] by [facility].  Seems he has been coughing for 2 weeks but today developed acute dyspnea.  He is chronically on 3 L by nasal cannula he has a history of CHF, COPD/asthma continues to smoke.  He denies any pain nausea vomiting  Denies any fever or significant cough.  No other associated symptoms.     Home Medications Prior to Admission medications   Medication Sig Start Date End Date Taking? Authorizing Provider  predniSONE (STERAPRED UNI-PAK 21 TAB) 10 MG (21) TBPK tablet Take by mouth daily. Take 6 tabs by mouth daily  for 2 days, then 5 tabs for 2 days, then 4 tabs for 2 days, then 3 tabs for 2 days, 2 tabs for 2 days, then 1 tab by mouth daily for 2 days 02/26/22  Yes Michaela Shankel S, PA  albuterol (PROVENTIL) (2.5 MG/3ML) 0.083% nebulizer solution Take 3 mLs (2.5 mg total) by nebulization every 4 (four) hours as needed for wheezing or shortness of breath. 07/17/21   Oswald Hillock, MD  amoxicillin-clavulanate (AUGMENTIN) 875-125 MG tablet Take 1 tablet by mouth 2 (two) times daily. One po bid x 7 days 08/13/21   Sherwood Gambler, MD  apixaban (ELIQUIS) 5 MG TABS tablet Take 1 tablet (5 mg total) by mouth 2 (two) times daily. 07/17/21   Oswald Hillock, MD  cloNIDine (CATAPRES) 0.1 MG tablet Take 1 tablet (0.1 mg total) by mouth 3 (three) times daily. 07/17/21   Oswald Hillock, MD  digoxin (LANOXIN) 0.25 MG tablet Take 1 tablet (0.25 mg total) by mouth daily. 09/02/19   Mitzi Hansen, MD  divalproex (DEPAKOTE) 250 MG DR tablet Take 3 tablets (750 mg total) by mouth 2 (two) times daily. 04/17/21 09/09/21  Samella Parr, NP  doxycycline (VIBRAMYCIN) 100 MG capsule Take 1 capsule (100 mg total) by mouth 2 (two) times daily. One po bid x 7 days 08/13/21   Sherwood Gambler, MD  empagliflozin (JARDIANCE) 10 MG TABS tablet Take 1 tablet (10 mg total) by mouth daily. 07/17/21   Oswald Hillock, MD  FLUoxetine (PROZAC) 20 MG capsule Take 1 capsule (20 mg total) by mouth daily. 09/02/19   Mitzi Hansen, MD  furosemide (LASIX) 40 MG tablet Take 1 tablet (40 mg total) by mouth daily. 07/18/21   Oswald Hillock, MD  hydrALAZINE (APRESOLINE) 50 MG tablet Take 1 tablet (50 mg total) by mouth every 8 (eight) hours. 07/17/21   Oswald Hillock, MD  insulin glargine (LANTUS) 100 UNIT/ML injection Inject 0.1 mLs (10 Units total) into the skin daily. Patient taking differently: Inject 10 Units into the skin at bedtime. 04/18/21   Samella Parr, NP  insulin regular (NOVOLIN R) 100 units/mL injection Inject 2-12 Units into the skin See admin instructions. Three times daily per sliding scale    [provider]  isosorbide mononitrate (IMDUR) 30 MG 24 hr tablet Take 1 tablet (30 mg total) by mouth daily. 07/18/21   Oswald Hillock, MD  QUEtiapine (SEROQUEL) 200 MG tablet Take 1 tablet (200 mg total) by mouth at bedtime. 09/01/19  Christian, Rylee, MD  QUEtiapine (SEROQUEL) 50 MG tablet Take 1 tablet (50 mg total) by mouth in the morning. 04/17/21 09/09/21  Samella Parr, NP      Allergies    Patient has no known allergies.    Review of Systems   Review of Systems  Respiratory:  Positive for shortness of breath.    Physical Exam Updated Vital Signs BP (!) 155/95   Pulse 92   Temp 99.2 F (37.3 C) (Oral)   Resp 20   Ht 5\' 9"  (1.753 m)   Wt 98 kg   SpO2 94%   BMI 31.91 kg/m  Physical Exam Vitals and nursing note reviewed.  Constitutional:      Comments: Uncomfortable appearing 61 year old male.  Dyspneic, but able answer questions.  HENT:     Head: Normocephalic and atraumatic.     Nose: Nose normal.      Mouth/Throat:     Mouth: Mucous membranes are dry.  Eyes:     General: No scleral icterus. Cardiovascular:     Rate and Rhythm: Normal rate and regular rhythm.     Pulses: Normal pulses.     Heart sounds: Normal heart sounds.  Pulmonary:     Effort: No respiratory distress.     Breath sounds: Wheezing present.     Comments: Tachypneic, prolonged expiratory phase, speaking but in shortened sentences and structures.  Wheezing diffusely with diminished lung sounds diffusely. Abdominal:     Palpations: Abdomen is soft.     Tenderness: There is no abdominal tenderness.  Musculoskeletal:     Cervical back: Normal range of motion.     Right lower leg: No edema.     Left lower leg: No edema.  Skin:    General: Skin is warm and dry.     Capillary Refill: Capillary refill takes less than 2 seconds.  Neurological:     Mental Status: He is alert. Mental status is at baseline.  Psychiatric:        Mood and Affect: Mood normal.        Behavior: Behavior normal.    ED Results / Procedures / Treatments   Labs (all labs ordered are listed, but only abnormal results are displayed) Labs Reviewed  CBC - Abnormal; Notable for the following components:      Result Value   RBC 5.93 (*)    HCT 53.5 (*)    RDW 18.3 (*)    nRBC 1.0 (*)    All other components within normal limits  COMPREHENSIVE METABOLIC PANEL - Abnormal; Notable for the following components:   Potassium 5.3 (*)    Glucose, Bld 118 (*)    Calcium 8.6 (*)    Albumin 2.6 (*)    All other components within normal limits  BRAIN NATRIURETIC PEPTIDE - Abnormal; Notable for the following components:   B Natriuretic Peptide 134.4 (*)    All other components within normal limits  TROPONIN I (HIGH SENSITIVITY) - Abnormal; Notable for the following components:   Troponin I (High Sensitivity) 19 (*)    All other components within normal limits  TROPONIN I (HIGH SENSITIVITY) - Abnormal; Notable for the following components:    Troponin I (High Sensitivity) 20 (*)    All other components within normal limits  SARS CORONAVIRUS 2 BY RT PCR  DIGOXIN LEVEL    EKG EKG Interpretation  Date/Time:  Monday Feb 26 2022 15:19:14 EDT Ventricular Rate:  90 PR Interval:  136 QRS Duration: 95 QT  Interval:  357 QTC Calculation: 437 R Axis:   35 Text Interpretation: Sinus rhythm Nonspecific ST abnormality Confirmed by Lajean Saver [phone]) on 02/26/2022 3:49:29 PM  Radiology DG Chest Portable 1 View  Result Date: 02/26/2022 CLINICAL DATA:  Shortness of breath. Asthma exacerbation. Chronic hypoxia. COPD. EXAM: PORTABLE CHEST 1 VIEW COMPARISON:  08/13/2021 FINDINGS: Heart size remains within normal limits. Bilateral pleural-parenchymal scarring again seen. Chronic pulmonary interstitial prominence is also stable. No new or worsening areas of pulmonary opacity are seen. IMPRESSION: Stable bilateral pleural-parenchymal scarring and chronic interstitial disease. No acute findings. Electronically Signed   By: Marlaine Hind M.D.   On: 02/26/2022 16:03    Procedures Procedures    Medications Ordered in ED Medications  albuterol (VENTOLIN HFA) 108 (90 Base) MCG/ACT inhaler 2 puff (has no administration in time range)  ipratropium-albuterol (DUONEB) 0.5-2.5 (3) MG/3ML nebulizer solution 3 mL (3 mLs Nebulization Given 02/26/22 1630)  magnesium sulfate IVPB 2 g 50 mL (0 g Intravenous Stopped 02/26/22 1701)  methylPREDNISolone sodium succinate (SOLU-MEDROL) 125 mg/2 mL injection 125 mg (125 mg Intravenous Given 02/26/22 1551)  albuterol (PROVENTIL) (2.5 MG/3ML) 0.083% nebulizer solution 5 mg (5 mg Nebulization Given 02/26/22 1825)  ipratropium (ATROVENT) nebulizer solution 0.5 mg (0.5 mg Nebulization Given 02/26/22 1825)    ED Course/ Medical Decision Making/ A&P                           Medical Decision Making Amount and/or Complexity of Data Reviewed Labs: ordered. Radiology: ordered.  Risk Prescription drug management.  This  patient presents to the ED for concern of SOB, this involves a number of treatment options, and is a complaint that carries with it a high risk of complications and morbidity.  The causes for shortness of breath include but are not limited to Cardiac (AHF, pericardial effusion and tamponade, arrhythmias, ischemia, etc) Respiratory (COPD, asthma, pneumonia, pneumothorax, primary pulmonary hypertension, PE/VQ mismatch) Hematological (anemia) Neuromuscular (ALS, Guillain-Barr, etc)    Co morbidities: Discussed in HPI   Brief History:  Patient is a 61 year old male presented emergency room today with complaints of shortness of breath.  Brought in from prison by EMS.  Seems he has been coughing for 2 weeks but today developed acute dyspnea.  He is chronically on 3 L by nasal cannula he has a history of CHF, COPD/asthma continues to smoke.  He denies any pain nausea vomiting  Denies any fever or significant cough.  No other associated symptoms.      EMR reviewed including pt PMHx, past surgical history and past visits to ER.   See HPI for more details   Lab Tests:   I ordered and independently interpreted labs. Labs notable for the below abnormalities.  Patient is having symptoms consistent with a  COPD exacerbation.  Patient also with history of CHF however does not appear fluid overloaded and BNP 134.  He has had BNP numbers in the thousands before.  Troponin flat.  CMP with marginally elevated potassium will decrease in the setting of significant amount of albuterol given.  Will need to have this rechecked with PCP.  CBC unremarkable.  Digoxin level 0.8.  COVID test negative.  Imaging Studies:  Abnormal findings. I personally reviewed all imaging studies. Imaging notable for Abnormal appearing chest x-ray scar tissue versus atelectasis or infiltrate.  IMPRESSION:  Stable bilateral pleural-parenchymal scarring and chronic  interstitial disease. No acute findings.  I reviewed  radiologist read.  I also  reviewed prior x-rays.  This appears to be somewhat chronic appearing abnormal chest x-ray for this patient.  Cardiac Monitoring:  The patient was maintained on a cardiac monitor.  I personally viewed and interpreted the cardiac monitored which showed an underlying rhythm of: NSR, normal rate EKG non-ischemic   Medicines ordered:  I ordered medication including DuoNeb x3, mag 2 g, Solu-Medrol for SOB/wheezing Reevaluation of the patient after these medicines showed that the patient improved I have reviewed the patients home medicines and have made adjustments as needed   Critical Interventions:     Consults/Attending Physician   I discussed this case with my attending physician who cosigned this note including patient's presenting symptoms, physical exam, and planned diagnostics and interventions. Attending physician stated agreement with plan or made changes to plan which were implemented.   Attending physician assessed patient at bedside.    Reevaluation:  After the interventions noted above I re-evaluated patient and found that they have :improved Patient tells me he is asymptomatic now.  Still somewhat coarse lung sounds.  He is not on oxygen at home.  Social Determinants of Health:      Problem List / ED Course:  Patient is experiencing a COPD exacerbation.  History of CHF however not fluid overloaded on exam chest x-ray without acute abnormality and BNP is not elevated.  He has significant improvement with DuoNeb x3 Solu-Medrol and magnesium.  He is on oxygen and I will discharge home to prison where he will be monitored.  Return precautions were discussed.   Dispostion:  After consideration of the diagnostic results and the patients response to treatment, I feel that the patent would benefit from discharge home with steroids and MDI albuterol use.  Return precautions discussed.   Final Clinical Impression(s) / ED Diagnoses Final  diagnoses:  COPD exacerbation ([place])    Rx / DC Orders ED Discharge Orders          Ordered    predniSONE (STERAPRED UNI-PAK 21 TAB) 10 MG (21) TBPK tablet  Daily        02/26/22 1957              Nyoka Alcoser S, [place] 02/26/22 2014    Lajean Saver, MD 02/27/22 2135

## 2022-02-26 NOTE — Discharge Instructions (Signed)
Please take the steroids as prescribed.  I have given you a albuterol inhaler to use 2 puffs every 4 hours for the next 12 hours after that you can use as needed.  Please make sure that you are minimizing how much salt you eat.  Take all your medications as prescribed.  You can increase the oxygen that you are getting by nose to 4 L or up to 6 L for comfort.

## 2022-02-26 NOTE — ED Triage Notes (Signed)
Patient BIB [facility] with complaints of SHOB. Patient incarcerated from [facility] and has been on 3 L of O2 [place] for quite some time however had sudden onset of [facility] w/ exertion today. Upon EMS contact patient initially placed on 15 L NRB and administered 5 mg of albuterol tx. Pt then weaned down to 4 L upon arrival the hospital. Pt reports a cough for about 1 week. All other VSS at this time. Pt Aox4.

## 2023-11-30 DEATH — deceased
